# Patient Record
Sex: Male | Born: 1955 | Race: Black or African American | Hispanic: No | Marital: Married | Smoking: Former smoker
Health system: Southern US, Community
[De-identification: ages and names within clinical notes are randomized; demographics above are authoritative.]

## PROBLEM LIST (undated history)

## (undated) DIAGNOSIS — N29 Other disorders of kidney and ureter in diseases classified elsewhere: Secondary | ICD-10-CM

## (undated) DIAGNOSIS — Z79899 Other long term (current) drug therapy: Secondary | ICD-10-CM

## (undated) DIAGNOSIS — I639 Cerebral infarction, unspecified: Secondary | ICD-10-CM

## (undated) DIAGNOSIS — I509 Heart failure, unspecified: Secondary | ICD-10-CM

## (undated) DIAGNOSIS — F17200 Nicotine dependence, unspecified, uncomplicated: Secondary | ICD-10-CM

## (undated) DIAGNOSIS — N200 Calculus of kidney: Secondary | ICD-10-CM

## (undated) DIAGNOSIS — M8580 Other specified disorders of bone density and structure, unspecified site: Secondary | ICD-10-CM

## (undated) DIAGNOSIS — G4700 Insomnia, unspecified: Secondary | ICD-10-CM

## (undated) DIAGNOSIS — N183 Chronic kidney disease, stage 3 unspecified: Secondary | ICD-10-CM

## (undated) DIAGNOSIS — E785 Hyperlipidemia, unspecified: Secondary | ICD-10-CM

## (undated) DIAGNOSIS — R809 Proteinuria, unspecified: Secondary | ICD-10-CM

## (undated) DIAGNOSIS — M549 Dorsalgia, unspecified: Secondary | ICD-10-CM

## (undated) DIAGNOSIS — N529 Male erectile dysfunction, unspecified: Secondary | ICD-10-CM

## (undated) DIAGNOSIS — I2699 Other pulmonary embolism without acute cor pulmonale: Secondary | ICD-10-CM

## (undated) DIAGNOSIS — G4730 Sleep apnea, unspecified: Secondary | ICD-10-CM

## (undated) DIAGNOSIS — E669 Obesity, unspecified: Secondary | ICD-10-CM

## (undated) DIAGNOSIS — I10 Essential (primary) hypertension: Secondary | ICD-10-CM

## (undated) DIAGNOSIS — D869 Sarcoidosis, unspecified: Secondary | ICD-10-CM

## (undated) DIAGNOSIS — I77810 Thoracic aortic ectasia: Secondary | ICD-10-CM

## (undated) DIAGNOSIS — I5189 Other ill-defined heart diseases: Secondary | ICD-10-CM

## (undated) DIAGNOSIS — K859 Acute pancreatitis without necrosis or infection, unspecified: Secondary | ICD-10-CM

## (undated) DIAGNOSIS — K219 Gastro-esophageal reflux disease without esophagitis: Secondary | ICD-10-CM

## (undated) HISTORY — DX: Insomnia, unspecified: G47.00

## (undated) HISTORY — DX: Other ill-defined heart diseases: I51.89

## (undated) HISTORY — DX: Other pulmonary embolism without acute cor pulmonale: I26.99

## (undated) HISTORY — DX: Hypercalcemia: E83.52

## (undated) HISTORY — DX: Chronic kidney disease, stage 3 unspecified: N18.30

## (undated) HISTORY — DX: Male erectile dysfunction, unspecified: N52.9

## (undated) HISTORY — DX: Chronic kidney disease, stage 3 (moderate): N18.3

## (undated) HISTORY — PX: ABDOMINAL EXPLORATION SURGERY: SHX538

## (undated) HISTORY — DX: Cerebral infarction, unspecified: I63.9

## (undated) HISTORY — DX: Proteinuria, unspecified: R80.9

## (undated) HISTORY — DX: Gastro-esophageal reflux disease without esophagitis: K21.9

## (undated) HISTORY — PX: BACK SURGERY: SHX140

## (undated) HISTORY — PX: SHOULDER SURGERY: SHX246

## (undated) HISTORY — DX: Other disorders of kidney and ureter in diseases classified elsewhere: N29

## (undated) HISTORY — DX: Obesity, unspecified: E66.9

## (undated) HISTORY — DX: Other long term (current) drug therapy: Z79.899

## (undated) HISTORY — DX: Other specified disorders of bone density and structure, unspecified site: M85.80

## (undated) HISTORY — DX: Nicotine dependence, unspecified, uncomplicated: F17.200

## (undated) HISTORY — PX: CHOLECYSTECTOMY: SHX55

## (undated) HISTORY — PX: TRACHEOSTOMY: SUR1362

## (undated) HISTORY — DX: Other disorders of calcium metabolism: E83.59

## (undated) HISTORY — DX: Calculus of kidney: N20.0

## (undated) HISTORY — DX: Dorsalgia, unspecified: M54.9

---

## 1998-07-18 ENCOUNTER — Encounter: Admission: RE | Admit: 1998-07-18 | Discharge: 1998-10-16 | Payer: Self-pay | Admitting: Orthopedic Surgery

## 2000-03-29 ENCOUNTER — Encounter: Payer: Self-pay | Admitting: Emergency Medicine

## 2000-03-29 ENCOUNTER — Inpatient Hospital Stay (HOSPITAL_COMMUNITY): Admission: EM | Admit: 2000-03-29 | Discharge: 2000-04-01 | Payer: Self-pay | Admitting: Emergency Medicine

## 2000-03-30 ENCOUNTER — Encounter: Payer: Self-pay | Admitting: Internal Medicine

## 2000-03-31 ENCOUNTER — Encounter: Payer: Self-pay | Admitting: Cardiovascular Disease

## 2000-04-01 ENCOUNTER — Encounter: Payer: Self-pay | Admitting: Internal Medicine

## 2001-03-19 ENCOUNTER — Encounter: Payer: Self-pay | Admitting: Surgery

## 2001-03-19 ENCOUNTER — Encounter: Payer: Self-pay | Admitting: Emergency Medicine

## 2001-03-19 ENCOUNTER — Inpatient Hospital Stay (HOSPITAL_COMMUNITY): Admission: EM | Admit: 2001-03-19 | Discharge: 2001-06-05 | Payer: Self-pay | Admitting: Emergency Medicine

## 2001-03-19 ENCOUNTER — Encounter (INDEPENDENT_AMBULATORY_CARE_PROVIDER_SITE_OTHER): Payer: Self-pay | Admitting: Specialist

## 2001-03-21 ENCOUNTER — Encounter: Payer: Self-pay | Admitting: Surgery

## 2001-03-21 ENCOUNTER — Encounter: Payer: Self-pay | Admitting: General Surgery

## 2001-03-22 ENCOUNTER — Encounter: Payer: Self-pay | Admitting: Surgery

## 2001-03-25 ENCOUNTER — Encounter: Payer: Self-pay | Admitting: Surgery

## 2001-03-27 ENCOUNTER — Encounter: Payer: Self-pay | Admitting: Surgery

## 2001-03-28 ENCOUNTER — Encounter: Payer: Self-pay | Admitting: Pulmonary Disease

## 2001-03-28 ENCOUNTER — Encounter: Payer: Self-pay | Admitting: Surgery

## 2001-03-30 ENCOUNTER — Encounter: Payer: Self-pay | Admitting: Surgery

## 2001-03-31 ENCOUNTER — Encounter: Payer: Self-pay | Admitting: Nephrology

## 2001-03-31 ENCOUNTER — Encounter: Payer: Self-pay | Admitting: Surgery

## 2001-04-02 ENCOUNTER — Encounter: Payer: Self-pay | Admitting: Gastroenterology

## 2001-04-03 ENCOUNTER — Encounter: Payer: Self-pay | Admitting: Gastroenterology

## 2001-04-04 ENCOUNTER — Encounter: Payer: Self-pay | Admitting: Surgery

## 2001-04-05 ENCOUNTER — Encounter: Payer: Self-pay | Admitting: Pulmonary Disease

## 2001-04-06 ENCOUNTER — Encounter: Payer: Self-pay | Admitting: Pulmonary Disease

## 2001-04-08 ENCOUNTER — Encounter: Payer: Self-pay | Admitting: Pulmonary Disease

## 2001-04-10 ENCOUNTER — Encounter: Payer: Self-pay | Admitting: Surgery

## 2001-04-11 ENCOUNTER — Encounter: Payer: Self-pay | Admitting: Pulmonary Disease

## 2001-04-12 ENCOUNTER — Encounter: Payer: Self-pay | Admitting: Surgery

## 2001-04-13 ENCOUNTER — Encounter: Payer: Self-pay | Admitting: Pulmonary Disease

## 2001-04-15 ENCOUNTER — Encounter: Payer: Self-pay | Admitting: Pulmonary Disease

## 2001-04-16 ENCOUNTER — Encounter: Payer: Self-pay | Admitting: Pulmonary Disease

## 2001-04-16 ENCOUNTER — Encounter: Payer: Self-pay | Admitting: Surgery

## 2001-04-16 ENCOUNTER — Encounter: Payer: Self-pay | Admitting: General Surgery

## 2001-04-17 ENCOUNTER — Encounter: Payer: Self-pay | Admitting: Pulmonary Disease

## 2001-04-18 ENCOUNTER — Encounter: Payer: Self-pay | Admitting: Surgery

## 2001-04-20 ENCOUNTER — Encounter: Payer: Self-pay | Admitting: Surgery

## 2001-04-21 ENCOUNTER — Encounter: Payer: Self-pay | Admitting: Surgery

## 2001-04-27 ENCOUNTER — Encounter: Payer: Self-pay | Admitting: Critical Care Medicine

## 2001-04-27 ENCOUNTER — Encounter: Payer: Self-pay | Admitting: Surgery

## 2001-04-28 ENCOUNTER — Encounter: Payer: Self-pay | Admitting: Surgery

## 2001-04-29 ENCOUNTER — Encounter: Payer: Self-pay | Admitting: Critical Care Medicine

## 2001-04-30 ENCOUNTER — Encounter: Payer: Self-pay | Admitting: Critical Care Medicine

## 2001-05-03 ENCOUNTER — Encounter: Payer: Self-pay | Admitting: Surgery

## 2001-05-07 ENCOUNTER — Encounter: Payer: Self-pay | Admitting: Surgery

## 2001-05-09 ENCOUNTER — Encounter: Payer: Self-pay | Admitting: Surgery

## 2001-05-10 ENCOUNTER — Encounter: Payer: Self-pay | Admitting: Surgery

## 2001-05-12 ENCOUNTER — Encounter: Payer: Self-pay | Admitting: Surgery

## 2001-05-17 ENCOUNTER — Encounter: Payer: Self-pay | Admitting: Surgery

## 2001-05-19 ENCOUNTER — Encounter: Payer: Self-pay | Admitting: Surgery

## 2001-05-26 ENCOUNTER — Encounter: Payer: Self-pay | Admitting: Surgery

## 2001-05-30 ENCOUNTER — Encounter: Payer: Self-pay | Admitting: Surgery

## 2001-06-14 ENCOUNTER — Encounter: Admission: RE | Admit: 2001-06-14 | Discharge: 2001-07-27 | Payer: Self-pay | Admitting: Surgery

## 2001-07-07 ENCOUNTER — Ambulatory Visit (HOSPITAL_COMMUNITY): Admission: RE | Admit: 2001-07-07 | Discharge: 2001-07-07 | Payer: Self-pay | Admitting: Gastroenterology

## 2001-07-07 ENCOUNTER — Encounter: Payer: Self-pay | Admitting: Gastroenterology

## 2001-08-18 ENCOUNTER — Encounter (INDEPENDENT_AMBULATORY_CARE_PROVIDER_SITE_OTHER): Payer: Self-pay

## 2001-08-18 ENCOUNTER — Ambulatory Visit (HOSPITAL_COMMUNITY): Admission: RE | Admit: 2001-08-18 | Discharge: 2001-08-18 | Payer: Self-pay | Admitting: Gastroenterology

## 2001-08-18 ENCOUNTER — Encounter: Payer: Self-pay | Admitting: Surgery

## 2003-03-14 ENCOUNTER — Ambulatory Visit (HOSPITAL_COMMUNITY): Admission: RE | Admit: 2003-03-14 | Discharge: 2003-03-14 | Payer: Self-pay | Admitting: Internal Medicine

## 2003-03-14 ENCOUNTER — Encounter: Payer: Self-pay | Admitting: Internal Medicine

## 2003-03-14 ENCOUNTER — Encounter (INDEPENDENT_AMBULATORY_CARE_PROVIDER_SITE_OTHER): Payer: Self-pay | Admitting: Specialist

## 2003-05-29 ENCOUNTER — Emergency Department (HOSPITAL_COMMUNITY): Admission: EM | Admit: 2003-05-29 | Discharge: 2003-05-30 | Payer: Self-pay | Admitting: Emergency Medicine

## 2003-10-02 ENCOUNTER — Inpatient Hospital Stay (HOSPITAL_COMMUNITY): Admission: RE | Admit: 2003-10-02 | Discharge: 2003-10-11 | Payer: Self-pay | Admitting: Internal Medicine

## 2003-10-14 ENCOUNTER — Ambulatory Visit (HOSPITAL_COMMUNITY): Admission: RE | Admit: 2003-10-14 | Discharge: 2003-10-14 | Payer: Self-pay | Admitting: Internal Medicine

## 2003-11-20 ENCOUNTER — Inpatient Hospital Stay (HOSPITAL_COMMUNITY): Admission: AD | Admit: 2003-11-20 | Discharge: 2003-11-29 | Payer: Self-pay | Admitting: Internal Medicine

## 2003-12-07 ENCOUNTER — Emergency Department (HOSPITAL_COMMUNITY): Admission: EM | Admit: 2003-12-07 | Discharge: 2003-12-07 | Payer: Self-pay | Admitting: Emergency Medicine

## 2003-12-20 ENCOUNTER — Inpatient Hospital Stay (HOSPITAL_COMMUNITY): Admission: RE | Admit: 2003-12-20 | Discharge: 2003-12-25 | Payer: Self-pay | Admitting: Surgery

## 2004-01-25 ENCOUNTER — Inpatient Hospital Stay (HOSPITAL_COMMUNITY): Admission: EM | Admit: 2004-01-25 | Discharge: 2004-01-30 | Payer: Self-pay | Admitting: Emergency Medicine

## 2004-02-12 ENCOUNTER — Encounter: Admission: RE | Admit: 2004-02-12 | Discharge: 2004-02-12 | Payer: Self-pay | Admitting: Surgery

## 2004-03-04 ENCOUNTER — Encounter: Admission: RE | Admit: 2004-03-04 | Discharge: 2004-03-04 | Payer: Self-pay | Admitting: Surgery

## 2004-09-29 ENCOUNTER — Encounter: Admission: RE | Admit: 2004-09-29 | Discharge: 2004-09-29 | Payer: Self-pay | Admitting: Gastroenterology

## 2005-04-04 ENCOUNTER — Ambulatory Visit (HOSPITAL_COMMUNITY): Admission: RE | Admit: 2005-04-04 | Discharge: 2005-04-04 | Payer: Self-pay | Admitting: Family Medicine

## 2005-04-04 ENCOUNTER — Emergency Department (HOSPITAL_COMMUNITY): Admission: EM | Admit: 2005-04-04 | Discharge: 2005-04-04 | Payer: Self-pay | Admitting: Family Medicine

## 2005-04-05 ENCOUNTER — Emergency Department (HOSPITAL_COMMUNITY): Admission: EM | Admit: 2005-04-05 | Discharge: 2005-04-05 | Payer: Self-pay | Admitting: *Deleted

## 2005-07-19 ENCOUNTER — Ambulatory Visit (HOSPITAL_COMMUNITY): Admission: RE | Admit: 2005-07-19 | Discharge: 2005-07-19 | Payer: Self-pay | Admitting: Internal Medicine

## 2006-09-15 ENCOUNTER — Encounter: Admission: RE | Admit: 2006-09-15 | Discharge: 2006-09-15 | Payer: Self-pay | Admitting: General Surgery

## 2007-02-07 ENCOUNTER — Inpatient Hospital Stay (HOSPITAL_COMMUNITY): Admission: AD | Admit: 2007-02-07 | Discharge: 2007-02-09 | Payer: Self-pay | Admitting: Internal Medicine

## 2007-02-07 ENCOUNTER — Ambulatory Visit: Payer: Self-pay | Admitting: Internal Medicine

## 2007-05-11 ENCOUNTER — Ambulatory Visit: Payer: Self-pay | Admitting: Internal Medicine

## 2007-05-11 LAB — CONVERTED CEMR LAB: Angiotensin 1 Converting Enzyme: 29 units/L (ref 9–67)

## 2007-10-03 ENCOUNTER — Encounter: Payer: Self-pay | Admitting: Internal Medicine

## 2007-10-05 ENCOUNTER — Ambulatory Visit (HOSPITAL_COMMUNITY): Admission: RE | Admit: 2007-10-05 | Discharge: 2007-10-05 | Payer: Self-pay | Admitting: Internal Medicine

## 2007-10-08 ENCOUNTER — Emergency Department (HOSPITAL_COMMUNITY): Admission: EM | Admit: 2007-10-08 | Discharge: 2007-10-09 | Payer: Self-pay | Admitting: Emergency Medicine

## 2007-10-16 ENCOUNTER — Emergency Department (HOSPITAL_COMMUNITY): Admission: EM | Admit: 2007-10-16 | Discharge: 2007-10-16 | Payer: Self-pay | Admitting: Emergency Medicine

## 2008-02-18 ENCOUNTER — Emergency Department (HOSPITAL_COMMUNITY): Admission: EM | Admit: 2008-02-18 | Discharge: 2008-02-18 | Payer: Self-pay | Admitting: Family Medicine

## 2009-04-27 ENCOUNTER — Ambulatory Visit: Payer: Self-pay | Admitting: Cardiovascular Disease

## 2009-04-27 ENCOUNTER — Inpatient Hospital Stay (HOSPITAL_COMMUNITY): Admission: EM | Admit: 2009-04-27 | Discharge: 2009-04-28 | Payer: Self-pay | Admitting: Emergency Medicine

## 2009-04-28 ENCOUNTER — Ambulatory Visit: Payer: Self-pay

## 2009-04-28 ENCOUNTER — Encounter: Payer: Self-pay | Admitting: Cardiology

## 2009-06-18 ENCOUNTER — Emergency Department (HOSPITAL_COMMUNITY): Admission: EM | Admit: 2009-06-18 | Discharge: 2009-06-19 | Payer: Self-pay | Admitting: Emergency Medicine

## 2009-07-21 ENCOUNTER — Encounter: Admission: RE | Admit: 2009-07-21 | Discharge: 2009-07-21 | Payer: Self-pay | Admitting: Orthopaedic Surgery

## 2009-08-05 ENCOUNTER — Encounter: Admission: RE | Admit: 2009-08-05 | Discharge: 2009-08-05 | Payer: Self-pay | Admitting: Orthopaedic Surgery

## 2009-11-24 ENCOUNTER — Encounter: Payer: Self-pay | Admitting: Sports Medicine

## 2009-11-28 ENCOUNTER — Ambulatory Visit: Payer: Self-pay | Admitting: Family Medicine

## 2009-11-28 DIAGNOSIS — M25519 Pain in unspecified shoulder: Secondary | ICD-10-CM | POA: Insufficient documentation

## 2010-03-02 ENCOUNTER — Encounter: Payer: Self-pay | Admitting: Nephrology

## 2010-03-02 ENCOUNTER — Observation Stay (HOSPITAL_COMMUNITY): Admission: AD | Admit: 2010-03-02 | Discharge: 2010-03-03 | Payer: Self-pay | Admitting: Urology

## 2010-06-26 ENCOUNTER — Encounter: Admission: RE | Admit: 2010-06-26 | Discharge: 2010-06-26 | Payer: Self-pay | Admitting: Family Medicine

## 2010-09-20 ENCOUNTER — Ambulatory Visit (HOSPITAL_COMMUNITY)
Admission: RE | Admit: 2010-09-20 | Discharge: 2010-09-20 | Payer: Self-pay | Source: Home / Self Care | Attending: Family Medicine | Admitting: Family Medicine

## 2010-09-20 ENCOUNTER — Encounter: Payer: Self-pay | Admitting: Family Medicine

## 2010-09-27 ENCOUNTER — Encounter: Payer: Self-pay | Admitting: Orthopaedic Surgery

## 2010-09-27 ENCOUNTER — Encounter: Payer: Self-pay | Admitting: Gastroenterology

## 2010-09-27 ENCOUNTER — Encounter: Payer: Self-pay | Admitting: Urology

## 2010-10-06 NOTE — Consult Note (Signed)
Summary: [facility]   Imported By: Knox Royalty 12/24/2009 11:12:37  _____________________________________________________________________  External Attachment:    Type:   Image     Comment:   External Document

## 2010-10-06 NOTE — Assessment & Plan Note (Signed)
Summary: [name]   Vital Signs:  Patient profile:   55 year old male Height:      72 inches Weight:      265 pounds BP sitting:   156 / 107  Vitals Entered By: Lillia Pauls CMA (November 28, 2009 10:31 AM)  History of Present Illness: Right-hand dominant. Accidentally fell down dimly lit staircase  ~3 weeks ago. No head trauma or LOC. Fell onto lateral left shoulder. No popping/tearing/subluxation/dislocation sensation. No paresthesias. No neck pain. No prior shoulder pain or injuries. Unsure as to whether he had limited left shoulder ROM prior to this injury.   Allergies: 1)  ! Codeine  Past History:  Past Medical History: Sarcoidosis HTN HLD Hx of pancreatitis with ARDS  Past Surgical History: Lumbar surgery for disc herniation and lumbar radiculitis  Social History: Not working  Physical Exam  General:  Well-developed,well-nourished,in no acute distress; alert,appropriate and cooperative throughout examination Msk:  NECK: Full ROM without pain or symptoms.  SHOULDERS: Full ROM/strength right.  Antero-lateral ttp left. No swelling/discoloration. 90 active flex/abd left. 100 passive flex/abd left. 30 deg ER deficit left. Full passive ER left. Nl IR left with pain. Decreased ER strength left otherwise nl strength. Pain throughout all motions except IR. (+) Hawkins, Empty Can, AC jt ttp on left. No AC jt step-off. No labral instability.  Extremities:  Normal nv exam of upper extremities. Additional Exam:  Musculoskeletal Ultrasound: Longitudinal and transverse views of the left shoulder revealed the following:  Biceps Tendon: Intact. AC Joint: Normal. Subscapularis: Increased bursal hypoechogenicity. Intact w/linear calcifications c/w old injuries. No dynamic subcoracoid impingement. Supraspinatus: Increased bursal hypoechogenicity. (+) dynamic subacromial impingement. Partial thickness tear over articular surface. Infraspinatus: Limited  views did not reveal a tear. Teres Minor: Limited views did not reveal a tear. Deltoid: Normal. Humeral Head: Normal contour.    Impression & Recommendations:  Problem # 1:  SHOULDER PAIN, LEFT (ICD-719.41)  Exam findings c/w frozen shoulder and SA bursitis. Ultrasound showing partial thickness supraspinatus tear.  After obtaining informed verbal consent from the patient, the anterior and posterior aspects of his left shoulder we prepped with alcohol and betadine. Ethyl chloride was used to anesthetize the skin. A 5 ml:3 ml mixture of 1% lidocaine and kenalog 89ml/ml was prepared for injection. One-half of this mixture was injected into the left GH joint without complications or difficulty. The remaining portion of the mixture was injected into the left sub-acromial space via a posterior approach.  - Immediately seek medical attention for fever, increased pain, shoulder swelling/discoloration, or any other concerns. - Continue current medication regimen for sarcoidosis. - Formal physical therapy. - RTC in 3-4 weeks for interim re-assessment.  Orders: Joint Aspirate / Injection, Large (20610) Kenalog 10 mg inj (J3301) [place] LIMITED ([id number])

## 2010-10-06 NOTE — Letter (Signed)
Summary: [facility] Referral form  [facility] Ref[facility]   Imported By: Marily Memos 12/01/2009 09:30:41  _____________________________________________________________________  External Attachment:    Type:   Image     Comment:   External Document

## 2010-11-22 LAB — BASIC METABOLIC PANEL
BUN: 13 mg/dL (ref 6–23)
CO2: 27 mEq/L (ref 19–32)
Calcium: 9 mg/dL (ref 8.4–10.5)
Chloride: 106 mEq/L (ref 96–112)
Creatinine, Ser: 1.77 mg/dL — ABNORMAL HIGH (ref 0.4–1.5)
GFR calc Af Amer: 49 mL/min — ABNORMAL LOW (ref >60)
GFR calc non Af Amer: 40 mL/min — ABNORMAL LOW (ref >60)
Glucose, Bld: 125 mg/dL — ABNORMAL HIGH (ref 70–99)
Potassium: 3.9 mEq/L (ref 3.5–5.1)
Sodium: 139 mEq/L (ref 135–145)

## 2010-11-22 LAB — CBC
HCT: 40.4 % (ref 39.0–52.0)
HCT: 42.8 % (ref 39.0–52.0)
HCT: 39.6 % (ref 39.0–52.0)
HCT: 39.9 % (ref 39.0–52.0)
Hemoglobin: 13.8 g/dL (ref 13.0–17.0)
Hemoglobin: 14.4 g/dL (ref 13.0–17.0)
Hemoglobin: 13.4 g/dL (ref 13.0–17.0)
Hemoglobin: 13.5 g/dL (ref 13.0–17.0)
MCH: 30 pg (ref 26.0–34.0)
MCH: 30.3 pg (ref 26.0–34.0)
MCH: 29.9 pg (ref 26.0–34.0)
MCH: 29.9 pg (ref 26.0–34.0)
MCHC: 33.7 g/dL (ref 30.0–36.0)
MCHC: 34.1 g/dL (ref 30.0–36.0)
MCHC: 33.8 g/dL (ref 30.0–36.0)
MCHC: 33.9 g/dL (ref 30.0–36.0)
MCV: 88.9 fL (ref 78.0–100.0)
MCV: 89 fL (ref 78.0–100.0)
MCV: 88.2 fL (ref 78.0–100.0)
MCV: 88.5 fL (ref 78.0–100.0)
Platelets: 222 10*3/uL (ref 150–400)
Platelets: 249 10*3/uL (ref 150–400)
Platelets: 233 10*3/uL (ref 150–400)
Platelets: 241 10*3/uL (ref 150–400)
RBC: 4.54 MIL/uL (ref 4.22–5.81)
RBC: 4.81 MIL/uL (ref 4.22–5.81)
RBC: 4.48 MIL/uL (ref 4.22–5.81)
RBC: 4.52 MIL/uL (ref 4.22–5.81)
RDW: 16.2 % — ABNORMAL HIGH (ref 11.5–15.5)
RDW: 16.3 % — ABNORMAL HIGH (ref 11.5–15.5)
RDW: 16.2 % — ABNORMAL HIGH (ref 11.5–15.5)
RDW: 16.3 % — ABNORMAL HIGH (ref 11.5–15.5)
WBC: 9 10*3/uL (ref 4.0–10.5)
WBC: 9.9 10*3/uL (ref 4.0–10.5)
WBC: 10 10*3/uL (ref 4.0–10.5)
WBC: 11.4 10*3/uL — ABNORMAL HIGH (ref 4.0–10.5)

## 2010-11-22 LAB — PROTIME-INR
INR: 1.1 (ref 0.00–1.49)
Prothrombin Time: 14.1 seconds (ref 11.6–15.2)

## 2010-11-22 LAB — APTT: aPTT: 33 seconds (ref 24–37)

## 2010-12-12 LAB — COMPREHENSIVE METABOLIC PANEL
ALT: 39 U/L (ref 0–53)
ALT: 46 U/L (ref 0–53)
AST: 22 U/L (ref 0–37)
AST: 28 U/L (ref 0–37)
Albumin: 3.2 g/dL — ABNORMAL LOW (ref 3.5–5.2)
Albumin: 3.4 g/dL — ABNORMAL LOW (ref 3.5–5.2)
Alkaline Phosphatase: 62 U/L (ref 39–117)
Alkaline Phosphatase: 73 U/L (ref 39–117)
BUN: 12 mg/dL (ref 6–23)
BUN: 9 mg/dL (ref 6–23)
CO2: 27 mEq/L (ref 19–32)
CO2: 27 mEq/L (ref 19–32)
Calcium: 9.4 mg/dL (ref 8.4–10.5)
Calcium: 9.5 mg/dL (ref 8.4–10.5)
Chloride: 106 mEq/L (ref 96–112)
Chloride: 108 mEq/L (ref 96–112)
Creatinine, Ser: 1.3 mg/dL (ref 0.4–1.5)
Creatinine, Ser: 1.35 mg/dL (ref 0.4–1.5)
GFR calc Af Amer: >60 mL/min (ref >60)
GFR calc Af Amer: >60 mL/min (ref >60)
GFR calc non Af Amer: 56 mL/min — ABNORMAL LOW (ref >60)
GFR calc non Af Amer: 58 mL/min — ABNORMAL LOW (ref >60)
Glucose, Bld: 103 mg/dL — ABNORMAL HIGH (ref 70–99)
Glucose, Bld: 112 mg/dL — ABNORMAL HIGH (ref 70–99)
Potassium: 3.4 mEq/L — ABNORMAL LOW (ref 3.5–5.1)
Potassium: 3.6 mEq/L (ref 3.5–5.1)
Sodium: 141 mEq/L (ref 135–145)
Sodium: 142 mEq/L (ref 135–145)
Total Bilirubin: 0.6 mg/dL (ref 0.3–1.2)
Total Bilirubin: 0.7 mg/dL (ref 0.3–1.2)
Total Protein: 6.7 g/dL (ref 6.0–8.3)
Total Protein: 7 g/dL (ref 6.0–8.3)

## 2010-12-12 LAB — CBC
HCT: 40.3 % (ref 39.0–52.0)
HCT: 41.6 % (ref 39.0–52.0)
Hemoglobin: 13.6 g/dL (ref 13.0–17.0)
Hemoglobin: 14.2 g/dL (ref 13.0–17.0)
MCHC: 33.8 g/dL (ref 30.0–36.0)
MCHC: 34.2 g/dL (ref 30.0–36.0)
MCV: 87.7 fL (ref 78.0–100.0)
MCV: 88.2 fL (ref 78.0–100.0)
Platelets: 193 10*3/uL (ref 150–400)
Platelets: 214 10*3/uL (ref 150–400)
RBC: 4.6 MIL/uL (ref 4.22–5.81)
RBC: 4.72 MIL/uL (ref 4.22–5.81)
RDW: 16.1 % — ABNORMAL HIGH (ref 11.5–15.5)
RDW: 16.5 % — ABNORMAL HIGH (ref 11.5–15.5)
WBC: 7.1 10*3/uL (ref 4.0–10.5)
WBC: 8.2 10*3/uL (ref 4.0–10.5)

## 2010-12-12 LAB — APTT: aPTT: 36 seconds (ref 24–37)

## 2010-12-12 LAB — POCT CARDIAC MARKERS
CKMB, poc: <1 ng/mL — ABNORMAL LOW (ref 1.0–8.0)
Myoglobin, poc: 68.6 ng/mL (ref 12–200)
Troponin i, poc: <0.05 ng/mL (ref 0.00–0.09)

## 2010-12-12 LAB — DIFFERENTIAL
Basophils Absolute: 0 10*3/uL (ref 0.0–0.1)
Basophils Relative: 1 % (ref 0–1)
Eosinophils Absolute: 0.7 10*3/uL (ref 0.0–0.7)
Eosinophils Relative: 10 % — ABNORMAL HIGH (ref 0–5)
Lymphocytes Relative: 12 % (ref 12–46)
Lymphs Abs: 0.9 10*3/uL (ref 0.7–4.0)
Monocytes Absolute: 0.4 10*3/uL (ref 0.1–1.0)
Monocytes Relative: 6 % (ref 3–12)
Neutro Abs: 5 10*3/uL (ref 1.7–7.7)
Neutrophils Relative %: 71 % (ref 43–77)

## 2010-12-12 LAB — LIPID PANEL
Cholesterol: 159 mg/dL (ref 0–200)
HDL: 27 mg/dL — ABNORMAL LOW (ref >39)
LDL Cholesterol: 64 mg/dL (ref 0–99)
Total CHOL/HDL Ratio: 5.9 RATIO
Triglycerides: 341 mg/dL — ABNORMAL HIGH (ref <150)
VLDL: 68 mg/dL — ABNORMAL HIGH (ref 0–40)

## 2010-12-12 LAB — PROTIME-INR
INR: 1 (ref 0.00–1.49)
Prothrombin Time: 13.2 seconds (ref 11.6–15.2)

## 2010-12-12 LAB — CARDIAC PANEL(CRET KIN+CKTOT+MB+TROPI)
CK, MB: 0.8 ng/mL (ref 0.3–4.0)
CK, MB: 0.9 ng/mL (ref 0.3–4.0)
Relative Index: INVALID (ref 0.0–2.5)
Relative Index: INVALID (ref 0.0–2.5)
Total CK: 51 U/L (ref 7–232)
Total CK: 73 U/L (ref 7–232)
Troponin I: 0.01 ng/mL (ref 0.00–0.06)
Troponin I: 0.01 ng/mL (ref 0.00–0.06)

## 2010-12-12 LAB — LIPASE, BLOOD: Lipase: 48 U/L (ref 11–59)

## 2010-12-12 LAB — CK TOTAL AND CKMB (NOT AT ARMC)
CK, MB: 1.2 ng/mL (ref 0.3–4.0)
Relative Index: INVALID (ref 0.0–2.5)
Total CK: 67 U/L (ref 7–232)

## 2010-12-12 LAB — HEPARIN LEVEL (UNFRACTIONATED)
Heparin Unfractionated: 0.48 IU/mL (ref 0.30–0.70)
Heparin Unfractionated: 0.57 IU/mL (ref 0.30–0.70)

## 2010-12-12 LAB — TROPONIN I: Troponin I: 0.01 ng/mL (ref 0.00–0.06)

## 2010-12-12 LAB — TSH: TSH: 0.689 u[IU]/mL (ref 0.350–4.500)

## 2011-01-19 NOTE — H&P (Signed)
[name], [name] NO.:  [id number]   MEDICAL RECORD NO.:  [id number]          PATIENT TYPE:  INP   LOCATION:  4732                         FACILITY:  [facility]   PHYSICIAN:  Manuel Pounds, MD       DATE OF BIRTH:  Jun 15, 1956   DATE OF ADMISSION:  02/07/2007  DATE OF DISCHARGE:                              HISTORY & PHYSICAL   PRIMARY CARE PHYSICIAN:  Manuel Pounds, MD.   PULMONOLOGIST:  Manuel Chris. Maple Hudson, MD, FCCP, FACP.   GASTROENTEROLOGIST:  [name], M.D.   UROLOGISTLucrezia Starch. Earlene Hall, M.D.   SURGEON:  Manuel Dare. Janee Hall, M.D.   CHIEF COMPLAINT:  Hypercalcemia and acute renal failure.   HISTORY OF PRESENT ILLNESS:  [name] is a patient well known  to me.  He is 55 years old and has biopsy-proven sarcoid and has seen  Dr. Fannie Knee in the past.  He had multiple pancreatic pseudocysts for  which multiple surgeries were undertaken by Dr. Magnus Ivan when he was  with [facility].  He also had a significant gallstone  pancreatitis which resulted in sepsis, ARDS, and acute renal failure for  which he was on dialysis for a short period of time back in July 2002.  He also had significant surgeries at that time.  The patient saw me on  April 29, followed up on May 16, and followed up again today, June 3.  The recent visits have been to include regular 67-month office visit for  hypertension, hyperlipidemia, and other medical issues. We have been  following his mild hypercalcemia which has mainly been in the range of  10.5 to 10.9.  We have been mainly attributing this to his underlying  sarcoidosis.   Labs have subsequently come back where his calcium in October 2006 was  10.2.  In November 2006, it was 11.0.  In March 2007, it was 10.4.  October 2007 it was 10.9, and on the April 29 visit, it went up to 12.1.  I had him come back the following week on May 5, and he had a calcium of  11.0 with ionized calcium of 6.3.  I repeated his labs  again on May 16  where he had a 25 hydroxyvitamin D which was low at 23.2.  He had intact  PTH which was appropriately suppressed at 12.7.  He had SPEP and UPEP  which were both negative.  His total protein was 8.3.  Creatinine over  this time was 1.6 and usually is around 1.3 to 1.5.  He was started on  hydrochlorothiazide earlier in the year, and this had subsequently been  discontinued for well over a month.  He drank plenty of water of late.  He took Lasix for 7 days.  He is not having any current symptoms  whatsoever except for mild chills, some stuffiness with nasal discharge,  mild cough but no signs or symptoms of the elevated calcium.  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  He is  not weak.  He is not having neurologic symptoms.  He is  not having any confusion.   I brought him in today to repeat his labs STAT and go over them at the  visit as I was expecting to have him back down to his baseline numbers.  Unfortunately, I was wrong.  His BUN was 21; his creatinine was 2.6, and  his calcium was 13.2.  After a long discussion with the patient, we  decided to admit him for further evaluation and treatment, place him on  IV fluids, get a broader range of labs, and decide what if other workup  needs to be done.  My underlying suspicion is that this is all related  to his sarcoidosis, although other options need to be entertained.   PAST MEDICAL HISTORY:  1. Gallstone pancreatitis in July 2002 resulting in sepsis, ARDS, and      acute renal failure for which he required hemodialysis.  He      subsequently underwent a cholecystectomy and a ventral hernia      repair.  2. Hypertension.  3. Hyperlipidemia.  4. Recurrent retroperitoneal abscesses requiring I&D, drains, and      antibiotics via PICC line.  5. ADD.  6. History of hemorrhoidectomy.  7. Known sarcoidosis.   SOCIAL HISTORY:  He is married.  He has two daughters.  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  He does not drink.  He does not  smoke.   FAMILY HISTORY:  Hypertension, stroke.   ALLERGIES:  CODEINE, VICODIN and intolerance to HYDROCHLOROTHIAZIDE  which makes his hypercalcemia worse.   MEDICATIONS:  1. Altace 10 b.i.d.  2. Cardizem CD 300 daily.  3. Benicar 20 daily.  4. TriCor 145 mg daily.  5. Ambien 10 mg nightly.  6. Wellbutrin XL 150 daily.   REVIEW OF SYSTEMS:  He denies any fevers, night sweats, weight changes,  or adenopathy.  He denies any headaches, sore throat, voice changes.  He  denies any skin issues.  He denies any chest pain, shortness of breath,  dyspnea on exertion, cardiothoracic type symptoms.  He does have a mild  cough. His wife says that he wheezes once in a while.  I do not hear any  at this current time.  He has been sniffing and stuffy from nasal  congestion.  He denies any urinary symptoms.  He denies any weakness or  numbness.  He denies any mood disturbance.  He denies any joint issues.  Denies any nausea, vomiting, diarrhea, or bowel issues.  He denies any  polyuria, polydipsia, or endocrine-type issues.   PHYSICAL EXAMINATION:  VITAL SIGNS:  Afebrile.  Pulse is 96.  Weight is  230 Hall, which is unchanged.  His blood pressure is 140/80.  GENERAL:  He is alert and oriented x3.  EYES, EARS, NOSE, AND THROAT:  PERRLA.  EOMI.  Sclerae clear.  Oropharynx moist.  NECK:  No JVD.  Supple without lymphadenopathy.  He has an old  tracheostomy scar.  CARDIOVASCULAR:  Regular without murmurs.  PULMONARY:  Clear to auscultation bilaterally.  ABDOMEN:  Obese and soft.  Multiple abdominal scars noted.  EXTREMITIES:  No clubbing, no cyanosis, no edema.  NEUROLOGIC:  Grossly intact.  Alert and oriented x3.  There are 2+  reflexes throughout.   ANCILLARY DATA:  Blood glucose 121, BUN 21, creatinine 2.6, sodium 137,  potassium 4.5, chloride 102, bicarb 28, calcium 13.2.  He had SPEP,  UPEP dated Jan 20, 2007, which showed no monoclonal gammopathy, and no  myeloma was seen.  Total protein at that time was 8.3 with a vitamin D  level of 23.2 and intact PTH of 12.7.  His last few creatinines have  been 1.6 and 1.8 as described above.  His ionized calcium was 6.3  recently.  His last hemoglobin was 13.1.  His last urinalysis was clear  with 2+ protein.  Last amylase and lipase were normal.   Biopsy report from March 14, 2003, of a retroperitoneal lymph node shows  granulomatous inflammation, no evidence of metastatic carcinoma or  lymphoproliferative process.  This was compatible with sarcoidosis.   ASSESSMENT:  This is a 55 year old gentleman being admitted with  significant hypercalcemia with acute renal failure.   PLAN:  1. Admit.  2. Hypercalcemia of questionable source, probably related to his      underlying sarcoidosis.  Will hold his Altace and Benicar at this      current time given the elevated creatinine. Will go ahead and give      him significant IV fluids overnight and check a CT scan of his      chest, abdomen, and pelvis to see if there is any other etiology or      any specific cancers that may be doing this.  Again, the patient      has known sarcoidosis and known lymph nodes in his abdomen.  He has      already been evaluated by Dr. Fannie Knee in the past.  It is very      possible this is a time when he might need to be started on some      steroids as well as the IV fluids that were given.  At this current      time, we will hold on the pamidronate and IV bisphosphate until      tomorrow to see where he ends up.  He may also need a renal consult      based on the elevated creatinine.  He has no current need for      hemodialysis.  Per Dr. Harriette Ohara note on April 19, 2003, I      think it is unlike that there is active disease demanding therapy      now.  I think sarcoid is much more likely than secondary illness      such as lymphoma.  He felt that we could  follow him      symptomatically.  At this point, he may be becoming symptomatic,      and clearly if this were lymphoma 4 years ago, he would no longer      be alive not getting any underlying treatment whatsoever.  3. As for the acute renal failure, there is a chance that he might      have some bladder obstructive symptoms. He has had these in the      past for which Dr. [name] has seen him.  The CT scan of the      chest, abdomen, and pelvis to see if he has any hydronephrosis.      His last PSA was 0.43 in the middle of last year.  4. I went ahead and ordered some tumor markers just to be safe and      thorough.  5. I did run a CT scan of his abdomen and pelvis with and without IV  contrast back on November 17, 2005, with the results being      questionable parenchymal scar versus developing neoplasm in the      right lower lobe.  Mesenteric, pelvic, and inguinal adenopathy were      noted.  Area of abnormality involving the region of the distal     descending colon suggested either developing diverticulitis or      scarring from prior inflammatory process.  These findings were      communicated to me and suggested return for repeat CT scan in the      future.  I discussed these results with Dr. Quentin Mulling, over at      [facility], and told him that most of these are      consistent with his prior CT scans and biopsy specifically showing      sarcoidosis in the past.  He agreed and left it in my court to      follow this up as deemed appropriate.  6. As stated above, he be getting IV fluids tonight.  7. May need pamidronate.  8. May need renal and pulmonary consults as stated above.  9. Will watch the blood pressure now that I have taken him off two of      his blood pressure medications that he may need.  10.He has a history of retroperitoneal abscess which has not been a      problem.  11.Insomnia.  He will be continued on Ambien.  12.Hyperlipidemia. This is  not currently an issue, but he remains on      the TriCor.  13.Hopefully by tomorrow we will have a better idea of what the      underlying plan will be based on what his numbers are tonight and      into tomorrow.      Manuel Pounds, MD  Electronically Signed     JMR/MEDQ  D:  02/07/2007  T:  02/07/2007  Job:  [id number]

## 2011-01-19 NOTE — Consult Note (Signed)
[name], [name]               ACCOUNT NO.:  [id number]   MEDICAL RECORD NO.:  [id number]          PATIENT TYPE:  INP   LOCATION:  4732                         FACILITY:  [facility]   PHYSICIAN:  Clinton D. Maple Hudson, MD, FCCP, FACPDATE OF BIRTH:  30-Aug-1956   DATE OF CONSULTATION:  02/08/2007  DATE OF DISCHARGE:                                 CONSULTATION   PROBLEM FOR CONSULTATION:  A 55 year old former smoker admitted by Dr.  Timothy Lasso and needing evaluation for sarcoidosis.   HISTORY:  He had originally been spotted with bilateral hilar adenopathy  on chest x-ray in 1994.  He was treated briefly then with prednisone,  but that was stopped when it caused heartburn and he was lost to  followup.  He subsequently presented again in May 2004 with hilar  adenopathy and an angiotensin converting enzyme level of 34 (18-55).  Chest x-ray showed some nodularity in the right lung.  PPD skin test was  negative in August 2004.  He had undergone needle biopsy of a  retroperitoneal lymph node in July 2004 showing noncaseating  granulomatous negative for AFB.  He subsequently had gallstone  pancreatitis and developed ARDS requiring intubation and tracheostomy.  Prednisone for the sarcoid was withheld out of concern that they would  reactivate his pancreatitis.  He has been relatively asymptomatic for  routine followup and was admitted out of concern about increasing  calcium and deteriorating renal function labs.   REVIEW OF SYSTEMS:  Pushing a little on his symptoms, he admits now he  has had a little low grade intermittent cough.  Occasionally, he notes  deep substernal ache, mild low grade fevers and night sweats.  There has  been nothing sudden.  He has recognized distinct bile tasting reflux  sometimes at night, with no dysphagia.  His wife tells him that he  wheezes at night after he has cut grass.  There is persistent nasal  congestion.  He has not been aware of obvious rash, hot joints or  adenopathy, blood or purulent discharge.   PAST HISTORY:  Gallstone pancreatitis 2002 with sepsis, ARDS and acute  renal failure requiring hemodialysis, ventilator support, tracheostomy  as noted, cholecystectomy, ventral hernia repair, hypertension,  hyperlipidemia, recurrent retroperitoneal abscesses requiring drains and  prolonged antibiotics, attention deficit disorder, hemorrhoidectomy,  sarcoidosis.   SOCIAL HISTORY:  Truck driver for [employer].   SUMMARY:  Regular sleep wake schedule working night shift.  No alcohol.  He had smoked one pack per day, quitting around 2002.   FAMILY HISTORY:  Positive for cerebral vascular accidents, hypertension.   ALLERGIES:  Drug intolerance:  CODEINE, VICODIN, HYDROCHLOROTHIAZIDE  because of his calcium.   PHYSICAL EXAMINATION:  VITAL SIGNS:  BP 126/84, pulse 93 room air  saturation 95%, temperature 98.6, respirations 18.  GENERAL:  He is a somewhat overweight, pleasant, comfortable-appearing  African American male who was asleep and woke easily when I walked into  the room.  SKIN:  No obvious rash.  ADENOPATHY:  None at that neck, shoulders or axillae.  HEENT:  Voice quality normal, oral mucosa unremarkable.  No stridor.  No  neck vein distension.  CHEST:  Faint crackles are appreciated in the right mid axillary line,  especially with the benefit of having seen his chest CT scan first.  Otherwise, lung fields were quiet, breathing unlabored.  CARDIOVASCULAR:  Heart sounds regular without murmur or gallop.  ABDOMEN:  Obese and soft, nontender with faint bowel sounds present.  EXTREMITIES:  Without cyanosis, clubbing or edema.   LABORATORY DATA:  Laboratory significant for pending angiotensin  converting enzyme level.  I reviewed his chest CT, which shows bilateral  hilar adenopathy and scarring in the right mid lung zone.  There is a  small pleural based nodule in the mid lateral left lung.   IMPRESSION:  1. Probably low grade chronic  active sarcoidosis, now with increased      calcium and renal function involvement.  I am not convinced that      his pulmonary sarcoid status has changed appreciably since he had a      CT scan at [facility] in 2004.  I will try to get our      radiologist to obtain that data for direct comparison with our      current film.  2. I agree with the initiation of prednisone therapy begun yesterday,      respecting long-term concerns about the status of his pancreas with      steroid therapy.  3. He wheezes after mowing the lawn, which may reflect an allergic      asthma.  We will want to get pulmonary function tests.  4. He needs aggressive antiesophageal reflux management.  5. I will be happy to follow him again as requested, and we will make      an office appointment opportunity.  I will be happy to discuss his      care.      Clinton D. Maple Hudson, MD, [facility], FACP  Electronically Signed     CDY/MEDQ  D:  02/08/2007  T:  02/09/2007  Job:  [id number]   cc:   Gwen Pounds, MD  Mindi Slicker Lowell Guitar, M.D.

## 2011-01-19 NOTE — H&P (Signed)
NAMEJERYL, UMHOLTZ NO.:  [id number]   MEDICAL RECORD NO.:  [id number]          PATIENT TYPE:  EMS   LOCATION:  MAJO                         FACILITY:  [facility]   PHYSICIAN:  Noralyn Pick. Eden Emms, MD, FACCDATE OF BIRTH:  1956-05-27   DATE OF ADMISSION:  04/27/2009  DATE OF DISCHARGE:                              HISTORY & PHYSICAL   PRIMARY CARDIOLOGIST:  New to [facility], being seen by Dr.  Eden Emms.   PRIMARY CARE Pola Furno:  Gwen Pounds, MD   PULMONOLOGIST:  Rennis Chris. Maple Hudson, MD, Tonny Bollman, FACP   NEPHROLOGIST:  Mindi Slicker. Lowell Guitar, MD   PATIENT PROFILE:  A 55 year old African American male with a prior  history of pulmonary sarcoidosis who presents with chest pain.   PROBLEMS:  1. Chest pain.      a.     History of negative Myoview or negative stress test about 15       years ago.  2. Hypertension.  3. Hyperlipidemia.  4. Pulmonary sarcoidosis.  5. History of hypercalcemia.  6. History of nephrocalcinosis.  7. History of gallstone pancreatitis status post laparoscopic      cholecystectomy in September 2002 complicated by recurrent      retroperitoneal abscesses requiring a prolonged period of      antibiotics.  8. Attention deficit disorder.   HISTORY OF PRESENT ILLNESS:  A 55 year old African American male without  prior cardiac history.  He was in his usual state of health for  approximately 1-2 weeks ago when he began to experience resting 1-2/10  substernal chest pressure, occasionally radiating to left arm without  shortness of breath or other associated symptoms, maybe slightly worse  with exertion, lasting hours at a time and resolving spontaneously.  Symptoms have been occurring more frequently and yesterday he felt  poorly while mowing the lawn and this improved with some rest.  Last  night, he woke 3 times with substernal chest pressure and was very  restless.  Pain has been constant since 1:00 a.m.  He presented to  Urgent Care this morning  and then subsequently to the [facility] ED.  His cardiac markers are negative x1.  He has been treated with  supplemental nitroglycerin, although this has had no impact on his  discomfort.  Currently, he complains of 2-3/10 mild chest pressure.   ALLERGIES:  CODEINE.   HOME MEDICATIONS:  1. Diltiazem 30 mg daily.  2. Atenolol 25 mg daily.  3. Prednisone 5 mg daily.  4. Benicar 40 mg daily.  5. Aspirin/Goody powder p.r.n.  6. Amitiza daily.  7. Norvasc 5 mg daily.  8. Lasix 20 mg daily.  9. Protonix 40 mg daily.  10.Simvastatin 20 mg daily.   FAMILY HISTORY:  Mother is alive at age 65 with Alzheimer's.  Father  died at 59 of cancer and CVA.  His two sisters with hypertension.  One  brother is alive and well.   SOCIAL HISTORY:  He lives in [place] with his wife.  He works as Corporate investment banker third shift.  He has 1-2 pack-year history of use, quitting  about 15-20 years ago.  Denies alcohol or drug use.  He is not routinely  exercising.   REVIEW OF SYSTEMS:  Positive for fevers, chills, chest pain, shortness  of breath, and constipation.  Otherwise, all systems reviewed are  negative.  He is a full code.   PHYSICAL EXAMINATION:  VITAL SIGNS:  Temperature 99.4, heart rate 63,  respirations 12, blood pressure 154/97, and pulse ox 98% on 2 L.  GENERAL:  Pleasant African American male in no acute distress.  Awake,  alert, and oriented x3.  He has a normal affect.  HEENT:  Normal.  NEUROLOGIC:  Grossly intact, nonfocal.  SKIN:  Warm and dry without lesions or masses.  NECK:  Supple without bruits or JVD.  LUNGS:  Respirations are regular and unlabored.  Clear to auscultation.  CARDIAC:  Regular S1-S2.  No S3, S4, or murmurs.  ABDOMEN:  Round, soft, nontender, and nondistended.  Bowel sounds  present x4.  EXTREMITIES:  Warm and dry.  No clubbing, cyanosis, or edema.  Dorsalis  pedis and posterior tibial pulses 2+ and equal bilaterally.   IMAGING:  Chest x-ray shows bibasilar  interstitial changes, stable mild  cardiomegaly, no acute abnormality.  EKG:  Sinus rhythm, rate of 64,  biphasic Ts in V3.   LABORATORY DATA:  Hemoglobin 14.2, hematocrit 41.6, WBC 7.1, and  platelets 193.  Sodium 142, potassium 3.6, chloride 108, CO2 27, BUN 9,  creatinine 1.35, and glucose 103.  AST 28, ALT 46, albumin 3.4, and  lipase 48.  CK-MB less than 1.0 and troponin I less than 0.05.   ASSESSMENT AND PLAN:  1. Chest pain, typical and atypical symptoms.  He has been having pain      for 11 hours now.  Point-of-care markers negative so far.  We plan      to admit and cycle cardiac enzymes.  If negative, plan home in the      a.m. with outpatient Myoview and echo.  2. Sarcoidosis, followed by Dr. Maple Hudson.  No pleuritic chest pain      symptoms at this time.  We will check echo as outpatient to rule      out cardiac involvement.  Check noncontrast chest CT here.      Continue prednisone.  3. Hypertension.  Blood pressure is elevated here in the ED.  He did      not take his medications this morning.  We will resume his home      medications and adjust as necessary.  Of note, he is on 2 calcium      channel blockers.  I will go ahead and increase his diltiazem and      hold off on his Norvasc for now.      Nicolasa Ducking, ANP      Noralyn Pick. Eden Emms, MD, [facility]  Electronically Signed    CB/MEDQ  D:  04/27/2009  T:  04/28/2009  Job:  [id number]

## 2011-01-19 NOTE — Consult Note (Signed)
[name], [name]               ACCOUNT NO.:  [id number]   MEDICAL RECORD NO.:  [id number]          PATIENT TYPE:  INP   LOCATION:  4732                         FACILITY:  [facility]   PHYSICIAN:  Alvin C. Lowell Guitar, M.D.  DATE OF BIRTH:  20-Jul-1956   DATE OF CONSULTATION:  DATE OF DISCHARGE:                                 CONSULTATION   REFERRING PHYSICIAN:  Gwen Pounds, MD   I was asked by Dr. Timothy Lasso to see this 55 year old male with a history of  acute renal failure who required hemodialysis in 2002 as a result of  complications from acute gallstone pancreatitis.  Patient had severe  ARDS and ventilatory-dependent respiratory failure at that time.  He had  multiple surgical operations.  He fully recovered renal function with a  baseline creatinine according to Dr. Ferd Hibbs note of 1.3 to 1.5 mg/dl.  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  He is admitted on this occasion due to abnormal laboratory  studies.  He has had persistent hypercalcemia off-and-on since 2006 with  admission calcium of 13.2 and serum creatinine of 2.6 mg/dl and recent  negative outpatient workup for hypercalcemia.  Patient had recently been  placed on hydrochlorothiazide.  He was also taking multivitamins which  have vitamin D and was taking two to three times per day for heartburn.  Outpatient workup as mentioned was unremarkable and included a negative  SPEP, UPEP, vitamin D level and low parathyroid hormone level.  Throughout his hospitalization, the patient has been treated with  intravenous fluids.   PAST HISTORY:  1. Gallstone pancreatitis complicated by ARDS, acute renal failure,      ventilatory-dependent respiratory failure, multiple abdominal      surgeries and hemodialysis and CVVHD.  2. Status post cholecystectomy.  3. History of hypertension.  4. History of dyslipidemia.  5. Attention deficit disorder.  6. Status post hemorrhoidectomy.  7. Sarcoidosis.   CURRENT MEDICATIONS DURING THIS HOSPITALIZATION:  Include:  1. Diltiazem.  2. Tricor.  3. Wellbutrin.  4. Prednisone.  5. Nasonex.  6. Ambien p.r.n.   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   FAMILY HISTORY:  Negative for kidney disease.   PHYSICAL EXAMINATION:  VITAL SIGNS:  Blood pressure 138/90.  GENERAL:  Pleasant African-American male.  HEENT:  Atraumatic, normocephalic.  Mucous membranes are moist.  LUNGS:  Clear.  HEART:  Regular rhythm and rate.  ABDOMEN:  Soft.  Midline abdominal scar well healed.  No  hepatosplenomegaly.  EXTREMITIES:  No edema.  NEUROLOGIC:  No focality, oriented x3.   Urinalysis:  7-10 red blood cells, no protein.   CT scan:  Kidneys are symmetric.   Calcium 11.9, PTH 3.6, sodium 135, potassium 4.9, chloride 98, CO2 28,  BUN 17, creatinine 1.83.   ASSESSMENT:  Acute renal failure with microhematuria.  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  The hypovolemia possibly  caused by the hypercalcemia.   RECOMMENDATIONS:  1. I agree with steroids.  2. Recheck urinalysis.  3. May need urologic evaluation if hematuria does not resolve.  4. Keep off ACE inhibitor and hydrochlorothiazide as you are doing.  5. Patient instructed regarding discontinuing Tums.  6. Patient's wife will bring in a bottle of multivitamin for      examination for quantity of vitamin D.  7. If sarcoid renal disease is present, steroids should help.  8. I see no reason for renal biopsy at this time.      Mindi Slicker. Lowell Guitar, M.D.  Electronically Signed     ACP/MEDQ  D:  02/08/2007  T:  02/08/2007  Job:   [id number]

## 2011-01-19 NOTE — Discharge Summary (Signed)
NAMEFARAAZ, WOLIN               ACCOUNT NO.:  [id number]   MEDICAL RECORD NO.:  [id number]          PATIENT TYPE:  INP   LOCATION:  3713                         FACILITY:  [facility]   PHYSICIAN:  Noralyn Pick. Eden Emms, MD, FACCDATE OF BIRTH:  05/14/1956   DATE OF ADMISSION:  04/27/2009  DATE OF DISCHARGE:  04/28/2009                               DISCHARGE SUMMARY   PRIMARY CARDIOLOGIST:  Theron Arista C. Eden Emms, MD, [facility]   PRIMARY CARE Camdon Saetern:  Gwen Pounds, MD   PRIMARY PULMONOLOGIST:  Rennis Chris. Maple Hudson, MD, FCCP, FACP   DISCHARGE DIAGNOSIS:  Chest pain.   SECONDARY DIAGNOSES:  1. History of pulmonary sarcoidosis.  2. Hypertension.  3. Hyperlipidemia.  4. History of hypercalcemia.  5. History of nephrocalcinosis.  6. History of gallstone pancreatitis status post laparoscopic      cholecystectomy in December 2002.  7. History of recurrent retroperitoneal abscesses.   ALLERGIES:  CODEINE.   PROCEDURES:  None.   HISTORY OF PRESENT ILLNESS:  A 55 year old African American male without  prior cardiac history.  He was in his usual state of health for  approximately 1-2 weeks prior to admission when he began to experience  predominantly resting 1-2/10 substernal chest pain and pressure  occasionally radiating down his left arm without associated symptoms  lasting several hours at time and resolving spontaneously.  He had more  frequent separate episodes over the past 2 days prompting presentation  to the ER.  In the ED, he continued to complain of slight chest pain  which was not reproducible.  His ECG was unremarkable and cardiac  markers were negative.  He was admitted for evaluation.   HOSPITAL COURSE:  The patient ruled out for MI.  He eventually did  achieve relief of chest discomfort without specific intervention.  We  have arranged for an outpatient exercise Myoview in our office this  morning.   DISCHARGE LABORATORY DATA:  Hemoglobin 13.6, hematocrit 40.3, WBC 8.2,  and  platelets 214.  Sodium 141, potassium 3.4, chloride 106, CO2 27, BUN  12, creatinine 1.30, and glucose 112.  Total bilirubin 0.6, alkaline  phosphatase 62, AST 22, ALT 39, total protein 6.7, albumin 3.2, calcium  9.5, and lipase 48.  CK 41, MB 0.9, and troponin I 0.01.  Total  cholesterol 139, triglycerides 341, HDL 27, and LDL 64.  TSH 0.689.   DISPOSITION:  The patient will be discharged home today in good  condition.   FOLLOWUP PLANS AND APPOINTMENTS:  We have arranged for a followup stress  test in our office this morning at 9:45.  He will follow with Dr. Eden Emms  on May 21, 2009 at 2:15 p.m.  We have asked to follow up with Dr.  Maple Hudson regarding his sarcoidosis.   DISCHARGE MEDICATIONS:  1. Enteric-coated aspirin 81 mg daily.  2. Nitroglycerin 0.4 mg sublingual p.r.n. chest pain.  3. Olmesartan 40 mg daily.  4. Cardizem CD 360 mg daily.  5. Ambien 10 mg at bedtime.  6. Atenolol 25 mg daily.  7. Benadryl 25 mg daily p.r.n.  8. Four-way inhaler over-the-counter 1-2 puffs daily  p.r.n.  9. Lasix 20 mg daily.  10.Prednisone 10 mg half a tablet daily.  11.Protonix 40 mg daily p.r.n.  12.Simvastatin 20 mg daily.   OUTSTANDING LABORATORY WORKUP AND STUDIES:  Stress test is pending.   DURATION OF DISCHARGE ENCOUNTER:  35 minutes including physician time.      Nicolasa Ducking, ANP      Noralyn Pick. Eden Emms, MD, [facility]  Electronically Signed    CB/MEDQ  D:  04/28/2009  T:  04/28/2009  Job:  [id number]   cc:   Gwen Pounds, MD  Clinton D. Maple Hudson, MD, FCCP, FACP

## 2011-01-19 NOTE — Assessment & Plan Note (Signed)
[facility]                             PULMONARY OFFICE NOTE   NAME:[name], [name]                      MRN:          [id number]  DATE:05/11/2007                            DOB:          December 30, 1955    PROBLEM LIST:  1. Sarcoid (nephrocalcinosis/hypercalcemia).  2. History of gallstone pancreatitis/adult respiratory distress      syndrome.  3. Hypertension.  4. Hyperlipidemia.   HISTORY:  This gentleman had been seen in the hospital in June with  impression that his pulmonary sarcoid status was probable stable at that  time.  He comes now describing occasional low grade fever and aching.  He sometimes wheezes a little when he is lying down, but otherwise  breathing feels comfortable.  Prednisone has been tapered from 40 mg  daily to 5 mg daily.  Bone density checked last week showed some  osteopenia and he is starting Fosamax.  He denies rash, jaundice,  adenopathy, or acute problems.  He had been seen for sarcoid with hilar  adenopathy in the mid 1990's.  At that time retroperitoneal lymph node  needle biopsy had shown granulomatous inflammation consistent with  sarcoid.  A chest CT in June of 2004 had shown multifocal pulmonary  nodules and nodular infiltrate especially in the right middle lobe with  mediastinal and hilar adenopathy.   MEDICATIONS:  1. Prednisone 5 mg daily.  2. Zocor.  3. Lasix.  4. Cardizem CD 300 mg.  5. Protonix.  6. Ambien 10 mg q.h.s. p.r.n.   ALLERGIES:  CODEINE.   OBJECTIVE:  VITAL SIGNS:  Weight 156 pounds, blood pressure 150/92,  pulse 90, room air saturation 95%.  GENERAL:  He seems comfortable today.  I find no adenopathy.  HEART:  Sounds are regular without murmur.  LUNGS:  Lung fields sound really quite clear.   IMPRESSION:  Sarcoid.  He has had a number of complications particularly  related to calcium level and renal function.   PLAN:  ACE level.  Schedule PFT and chest x-ray.  Try reducing  prednisone to 5 mg every other day.  Schedule return in 2 months,  earlier p.r.n.     Clinton D. Maple Hudson, MD, Tonny Bollman, FACP  Electronically Signed    CDY/MedQ  DD: 05/14/2007  DT: 05/14/2007  Job #: [id number]   cc:   Gwen Pounds, MD

## 2011-01-22 NOTE — Consult Note (Signed)
[name], [name]                         ACCOUNT NO.:  [id number]   MEDICAL RECORD NO.:  [id number]                   PATIENT TYPE:  OBV   LOCATION:  5704                                 FACILITY:  [facility]   PHYSICIAN:  Petra Kuba, M.D.                 DATE OF BIRTH:  Sep 10, 1955   DATE OF CONSULTATION:  10/01/2002  DATE OF DISCHARGE:                                   CONSULTATION   HISTORY:  The patient is seen at the request of Dr. Timothy Lasso for abdominal  abscess. The patient well known to me from previous hospitalization in 2002,  although I have not seen him in a year or two in the office. He had been  working and doing well, although over the last two weeks developed some  looser stools and some left upper quadrant pain and had increased fever. CAT  scan lead to a diagnosis of an abscess done on [facility] on Monday which  lead to drainage today when he was admitted for further workup and plans. I  have not yet rechecked our office chart but plan to, to find out what his  last CAT scan was. It sounds like other than a lung biopsy for sarcoid in  the summer has not had any other medical problems except for being treated  with a Z-Pack about two weeks ago for an upper respiratory tract infection.  He has changed his diet slightly and that may have affected his looser  stools. He had not had any endoscopies, colonoscopies, or other GI tests to  his or his wife's knowledge.   PAST MEDICAL HISTORY:  Pertinent for the sarcoid as well as a severe bout of  pancreatitis in 2002 complicated with ARDS and I believe renal failure. He  has had his gallbladder out and umbilical hernia repair as well as  hypertension.   CURRENT MEDICATIONS:  1. Tylenol.  2. Altace.  3. Diltiazem.   ALLERGIES:  CODEINE, OXYCODONE, and VICODIN.   FAMILY HISTORY:  Noncontributory.   SOCIAL HISTORY:  Does not smoke or drink and had been working up until this  above event.   REVIEW OF SYSTEMS:   Review of systems is pertinent for the numbness in his  feet is almost gone, rarely bothers him. He is able to walk and work.   PHYSICAL EXAMINATION:  GENERAL:  Vital signs in chart. Fever of 102 in no  acute distress.  ABDOMEN:  Exam pertinent for his abdomen being soft. He is tender around the  drain as well as in the periumbilical area but no guarding or rebound  elsewhere. Good bowel sounds.   LABORATORY DATA:  CBC pertinent for white count of 11. Other labs pending at  time of dictation. CAT scan done today, have not yet reviewed but plan to.   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   PLAN:  Will broaden the antibiotics and add Flagyl to his Cipro. Will review  the CT as above. Follow his labs. Might need Dr. Magnus Ivan to see the  patient; he is familiar with him. Might need something like MRCP to see if  there is a duct abnormality or an obvious __________, particularly if this  is a pseudocyst infection. Possibly he will  need an upper GI small-bowel follow through or barium enema to rule out any  fistulas connecting to the bowels. Possibly before removing the drain, we  will inject it to see if it connects with anything. If he has had a  colonoscopy, I will ask Dr. Timothy Lasso to try to get me that report. We will  follow with you.                                               Petra Kuba, M.D.    MEM/MEDQ  D:  10/02/2003  T:  10/03/2003  Job:  [id number]   cc:   Abigail Miyamoto, M.D.  [address]  [place]  [place] [address]  Fax: [phone]

## 2011-01-22 NOTE — Discharge Summary (Signed)
NAMEANSEN, SAYEGH                         ACCOUNT NO.:  [id number]   MEDICAL RECORD NO.:  [id number]                   PATIENT TYPE:  INP   LOCATION:  0377                                 FACILITY:  [facility]   PHYSICIAN:  Abigail Miyamoto, M.D.              DATE OF BIRTH:  08-03-1956   DATE OF ADMISSION:  01/24/2004  DATE OF DISCHARGE:  01/30/2004                                 DISCHARGE SUMMARY   DISCHARGE DIAGNOSES:  1. Retroperitoneal abscess.  2. History of hypertension.   SUMMARY OF HISTORY:  [name] is a 55 year old gentleman, who  presented in January with retroperitoneal abscess which was percutaneously  drained.  It did not appear to communicate with any other abdominal  structure, and abscess studies with contrast confirmed this.  Several weeks  later, the abscess again persisted and returned, and this time a work-up  including MRCP and colonoscopy was again performed which showed no  communications with any bowel.  At this point, though, given the recurrence,  the decision was made to proceed with exploratory laparotomy was done and,  again, multiple drains were left in the abscess cavity as it was thoroughly  washed out.  The patient appeared to do well and doing well at home with all  drains out until Jan 24, 2004, when he started having abdominal pain and  fever.  He reported the pain was mostly in his extreme left flank.  He was  admitted with a fever and a normal white blood count but was found to have,  on a CAT scan, a recurrence of the abscess which was a little less than 3 cm  in size.  Given these findings, the decision was made to admit the patient  to the hospital on IV antibiotics.   HOSPITAL COURSE:  The patient was admitted and placed on IV Primaxin.  He  quickly defervesced and, again, his white count remained normal throughout.  After review of films, he was taken down to CAT scan with percutaneous  drainage of the abscess.  The drain was so  small that a catheter could not  be left in place.  A PICC line was placed also at this time.  He did well  after this and after further discussion, the decision was made to discharge  him home with the drains in place on IV antibiotics via the PICC line.  Cultures did grow both gram-positive cocci, gram-positive rods, and gram-  negative rods.  Given his improvement, the decision was made to discharge  him, again as mentioned.   DISCHARGE DIET:  Regular.   DISCHARGE ACTIVITY:  As tolerated.  He is on hold on returning to work until  we finish the IV antibiotics.   MEDICATIONS:  1. Primaxin 750 mg IV q.8h.  2. He will resume his home medications.   He may shower.  He will follow up with me 4 days after  discharge.                                               Abigail Miyamoto, M.D.    DB/MEDQ  D:  02/10/2004  T:  02/10/2004  Job:  [id number]

## 2011-01-22 NOTE — Procedures (Signed)
[place]  Patient:    [name], [name]                        MRN: [id number] Proc. Date: 03/22/01 Adm. Date:  03/22/01 Attending:  Judie Petit, M.D.                           Procedure Report  PROCEDURE:  Intubation.  BRIEF HISTORY:  [name] is a 55 year old male with multiple medical problems.  The anesthesia team was called for an emergent intubation for this patient in respiratory distress and combative.  The case was briefly discussed with Dr. Johna Sheriff who was present.  The patient was intubated with ______ 300 mg, succinylcholine 140 mg with rapid sequence technique with cricoid pressure.  Positive bilateral breath sounds, positive CO2 by Easy ______ on the first attempt.  A #8 endotracheal tube was placed. The tube was secured 22 cm at the lip by respiratory therapy.  Bilateral breath sounds were noted afterwards.  PLAN:  Chest x-ray pending.   Respiratory care per Dr. Johna Sheriff and ICU staff. DD:  03/22/01 TD:  03/22/01 Job: [id number]

## 2011-01-22 NOTE — H&P (Signed)
[name], [name]                         ACCOUNT NO.:  [id number]   MEDICAL RECORD NO.:  [id number]                   PATIENT TYPE:  INP   LOCATION:  0377                                 FACILITY:  [facility]   PHYSICIAN:  Anselm Pancoast. Zachery Dakins, M.D.          DATE OF BIRTH:  1955-10-21   DATE OF ADMISSION:  01/24/2004  DATE OF DISCHARGE:                                HISTORY & PHYSICAL   HISTORY:  [name] is a 55 year old black male patient of Dr.  __________ hospitalized back in April with an infrapancreatic abscess that  occurred following a problem with pancreatitis approximately a year earlier  and he was hospitalized for persistent problems. He recovered, but developed  this abscess in the tail of the pancreas inferior area that had been  percutaneously draining, but since he was still having problems he was  hospitalized and operated on by Dr. Rayburn Ma. Drains were placed and the  area debrided. The patient was discharged approximately two weeks later and  has been on Augmentin up until approximately three days ago, but over the  last week has had intermittent low grade fever and states that he is getting  more sore in the left upper abdomen again. The patient also is known to have  sarcoidosis and this has been noted on the previous CT. He is not presently  on any treatment for it and he has been on steroids for this, but whether  that is factor, it has kind of persisted the abscess in the pancreatic area.  The patient came to the emergency room in the late evening of Jan 24, 2004,  with these symptoms and was seen by the ER physician who obtained lab  studies that were not abnormal, white count 9300 with ___________. CT was  performed with findings of about a 3 cm abscess along the inferior margin of  the tail of the pancreas, across the area of the surrounding cecum. From Dr.  Doroteo Glassman operative note I am not sure how the drain was placed, whether it  was lateral or  over the colon. The previous radiology percutaneous drain had  been placed inferior to the colon, but in very proximity to the colon. He  also had a colonoscopy some time in April that did not show any area of  abnormality with the exception that immediately adjacent to the colon of the  drain area.   His chronic medication has included Darvocet and Advil for pain, Augmentin  875 mg b.i.d. up until about three days ago, Altace 10 mg b.i.d.,  hydrochlorothiazide 20 mg a day, and some sleep medication.  Also Cardizem  CD with __________.  Please refer to the past medical history on his  previous hospital admission.   PHYSICAL EXAMINATION:  VITAL SIGNS: Temperature 100.9, pulse 94, blood  pressure 142/73.  He is 6 feet tall and weighs 203 pounds.  LUNGS: He has good breath sounds bilaterally.  CARDIAC: No tachycardia. He is a little tender with deep palpation over the  left rib area and you can see where he has little drains in the immediate  area. He has a midline incision that appears to have healed nicely. He is  nontender in the lower abdomen.  EXTREMITIES: No evidence of pedal edema or thrombophlebitis.   ADMISSION IMPRESSION:  1. Recurrent abscess inferior to the tail of the Pancrease.  2. History of sarcoidosis.  3. Hypertension.   PLAN:  We have started him on Unasyn, but he continued to have low-grade  fever and will switch to Primaxin. Her blood cultures are pending at this  time.  Hopefully, this will defervesce and whether he will need repeat  percutaneous drainage is a question. The patient is certainly not toxic  appearing and whether the past history of sarcoidosis and lymphadenopathy  can contribute any to the persistence or difficulty in this resolving is in  question.                                               Anselm Pancoast. Zachery Dakins, M.D.    WJW/MEDQ  D:  01/25/2004  T:  01/25/2004  Job:  [id number]

## 2011-01-22 NOTE — Op Note (Signed)
[name], EDMONDS                         ACCOUNT NO.:  [id number]   MEDICAL RECORD NO.:  [id number]                   PATIENT TYPE:  INP   LOCATION:  5014                                 FACILITY:  [facility]   PHYSICIAN:  Petra Kuba, M.D.                 DATE OF BIRTH:  22-Jan-1956   DATE OF PROCEDURE:  DATE OF DISCHARGE:                                 OPERATIVE REPORT   PROCEDURE:  Colonoscopy.   ENDOSCOPIST:  Petra Kuba, M.D.   INDICATION:  Abnormal  CT scan, want to rule out colon problem causing  recurrent abscesses.   INFORMED CONSENT:  Consent was signed after risks, benefits, methods and  options were thoroughly discussed multiple times in the past.   MEDICATIONS USED:  Demerol 100 mg, Versed 10 mg.   PROCEDURE:  Based on his drain, with him on his back, the video colonoscope  was inserted and with some abdominal pressure and then rolling him on his  right side, it was able to be advanced to the cecum.  No obvious abnormality  was seen on insertion.  The prep was only fair.  The cecum was identified by  the appendiceal orifice and the ileocecal valve; in fact, the scope was  inserted a short ways into the terminal ileum, which was normal.  Photo  documentation was obtained and the scope was slowly withdrawn.  With over a  liter of washing and suctioning, adequate visualization was obtained,  although there was a little stool which would obscure parts of the colon.  Upon slow withdrawal through the colon, other than a minimal area of  possible descending erythema, although this was difficult to know for sure  because we could not roll him on his left side and maximally inflate this  area, based on him being on his back, but other than that one area, no other  abnormalities were seen as we slowly withdrew back to the rectum.  Again,  based on him being on his back and unable to maximally inflate his rectum,  we elected not to retroflex.  Anorectal pull-through did  not reveal any  additional findings.  The scope was inserted a short ways back up the left  side of the colon, air was suctioned and scope removed.  The patient  tolerated the procedure well.  There was no obvious immediate complication.   ENDOSCOPIC DIAGNOSIS:  1. Questionable minimal area of erythema in the descending but unable to     roll on his left side and maximally inflate of questionable significance.  2. Otherwise within normal limits to the terminal ileum.   PLAN:  Plan per Dr. Abigail Miyamoto.  Would recheck his colon screening in  5 years.  Would go ahead and do a full colon prep at the time of surgery,  just in case, but I doubt the colon is related to this problem.  Let  me know  if I can be of any further assistance.  I have discussed the above with Dr.  Gwen Pounds.                                               Petra Kuba, M.D.    MEM/MEDQ  D:  11/28/2003  T:  11/29/2003  Job:  [id number]   cc:   Gwen Pounds, M.D.  [address]  [place]  [place] [address]  Fax: [phone]   Abigail Miyamoto, M.D.  [address]  [place]  [place] [address]  Fax: [phone]

## 2011-01-22 NOTE — Discharge Summary (Signed)
[place]. [facility]  Patient:    [name], [name] Visit Number: [id number] MRN: [id number]          Service Type: SUR Location: [id number] Attending Physician:  Shelly Rubenstein Dictated by:   Abigail Miyamoto, M.D. Admit Date:  03/19/2001 Discharge Date: 06/05/2001                             Discharge Summary  DISCHARGE DIAGNOSES:  1. Acute pancreatitis.  2. Choledocholithiasis with common bile duct obstruction.  3. Respiratory failure.  4. Hyperkalemia.  5. Acidosis.  6. Shock.  7. Respiratory failure.  8. Acute renal failure with need for dialysis.  9. Paralytic ileus. 10. Thrombocytopenia. 11. Septicemia. 12. Hypovolemia.  PROCEDURES:  Multiple central venous accesses as well as laparoscopic cholecystectomy and ERCP.  HISTORY:  [name] is a 55 year old gentleman who was admitted on March 19, 2001, with sudden onset of diffuse abdominal pain, nausea, and vomiting.  He was found, on examination, to have an abdomen that was diffusely tender.  His amylase, lipase, and LFTs were, however, unremarkable.  He did have an ultrasound which showed him to have cholelithiasis with a probable common bile duct stone and mildly dilated common bile duct.  HOSPITAL COURSE:  The patient was admitted and a gastroenterological consultation was obtained by Dr. Leary Roca.  At this point, the decision was made to proceed with an ERCP prior to cholecystectomy.  The patient underwent ERCP and no stone was identified.  By the next morning, the patient was having increased abdominal pain.  At this point, his amylase had climbed to 3000.  He was found to have a white blood count of 10.  His urine output decreased at this point.  He became dehydrated and tachycardic.  He was given multiple fluid boluses and by July 16, he continued to have decreased urine output as well as an elevation of white blood count, liver function tests and amylase. At this point, the  decision was made to proceed with CT scan of the abdomen and we transferred him to the intensive care unit.  At this point, it was felt he was having worsening pancreatitis.  It was felt that his pancreatitis was secondary to the common bile duct stone.  PICC line was placed when he was down in radiology.  A CT scan showed moderate pancreatitis with a large amount of intra-abdominal free fluid.  At this point, the patient was transferred to the intensive care unit.  He quickly declined from a cardiopulmonary standpoint that evening, as well as had fever and critical care was consulted. [name] was then electively intubated.  Because of his developing acute renal failure, nephrology was also called.  From this point on, the patient remained on a ventilator in the intensive care unit.  Lines were placed and he was started on CVVHD.  Because of his hypotension, he was maintained initially on dopamine and Levophed and IV antibiotics were continued.  TNA was also started from a nutrition standpoint.  He remained acidotic throughout this point as well.  He did have several episodes where he underwent short bradycardic and asystolic event which responded and was felt to be secondary to mucous plugging.  At this point, again, his care remained under mostly the critical care management and renal management who did very well in maintaining this situation.  During the hospitalization, multiple CT scans were performed which showed multiple  fluid collections, but no evidence of pancreatic necrosis.  Again, these were performed without IV contrast.  On July 23, one of these did show continued severe inflammation.  At this time, he still had a white blood count of 25,000 and remained relatively acidotic.  His nutrition was under control of pharmacy as well as the nutritionist managing his TNA. On July 24, he was continued on Zosyn as well as vancomycin.  The specific codes to which had happened earlier  occurred on April 04, 2001.  Again, this was a limited code and the patient quickly improved from that.  Over the next several weeks, he maintained in a _____ on multiple pressures.  He was taken to the operating room on April 10, 2001, where he underwent a tracheostomy. He made in _____ and acidotic shortly after this.  Tube feeds were attempted, however, because of high residuals he had to be maintained on TNA.  Again, more CT scans were performed which showed only fluid collections and no evidence of pancreatic necrosis.  Throughout his hospitalization, multiple transfusions were also performed secondary to his anemia of chronic disease. By late August, his renal output finally began to improve and his creatinine started to decrease, and by August 20, he was off CVVHD and he was tolerating tube feeds at 40 cc an hour.  His abdomen remained mildly tender.  At this point, sedation started to be weaned as well and he began tolerating this. Pulmonary critical care also started to make ground from a weaning standpoint from the ventilator.  Because of his continued fluid collections in the abdomen on CT scans, the decision was finally made to undergo CT-guided drainage of these fluid collections with catheter placement. Once this was done, his white count again continued to improve and his overall condition slowly started to improve as well.  He began making continued slow progress and began following commands.  Again, his ventilator was weaned further and he maintained adequate urine output.  Cultures on all his drains remained unremarkable.  By August 27, he began being placed in a chair in the intensive care unit in an upright position.  Physical therapy began working with him as he started to follow commands even more.  His amylase and lipase, on August 28, were normal as well as his white blood count.  His creatinine remained elevated.  At this point, he continued to tolerate his tube  feeds.  Again, more CT scans were performed which showed continued changes of severe pancreatitis.  By early September, [name] began making more strides on  improving his overall condition.  He was on only room air on his tracheostomy. He was putting out urine well and his strength was improving as well as all his laboratory data.  By September 7, his creatinine was down to 2.1.  At this point, with him following commands, he was placed on sips of liquids.  By September 20, he was continued on IV Cipro and Zosyn.  He began tolerating the sips of water and was advanced to juice and carbonated fluids which he again tolerated as well.  He was finally transferred out of the intensive care unit to the step-down unit.  He did have a fall while on step-down, but had no injuries from this.  His strength continued to improve and he began to improve from a mental status standpoint and became less confused.  At this point, he began tolerating a regular diet and his tracheostomy was removed.  He  did continue to have some residual left-sided abdominal pain.  Because of the continued improvement, the decision was finally made on May 26, 2001, to take him to the operating room where he underwent a laparoscopic cholecystectomy and intraoperative cholangiogram.  He was found to have a normal cholangiogram.  At this point, he tolerated the procedure well and was taken to a regular surgical floor.  He, at this point, continued to make great strides with the help of physical therapy.  His creatinine decreased to 1.3. His liver function tests remained normal.  His hemoglobin was stable at 9.0. His diet was again advanced to a low fat.  He began working well with this and we began making plans towards discharge.  Again, PMR was consulted and determined the patient could be managed with outpatient care.  By June 05, 2001, he was doing very well.  He was tolerating a regular diet.  He was ambulating  well.  His strength was much better, although he was still somewhat weak.  His abdomen was stable.  At this point, the decision was made to discharge him to home.  See above discharge diagnoses.  DISCHARGE ACTIVITY:  As tolerated.  He should do no heavy lifting.  Outpatient physical therapy is arranged.  DISCHARGE MEDICATIONS:  Demerol, Advil, and Tylenol for pain.  He will resume his home medications.  He will take Pancrease before meals.  He will take Ambien for sleep, Milk of Magnesia as needed for constipation as well as multivitamins.  He will take Ensure shakes as needed as well.  Outpatient physical therapy is arranged and he will follow up in my office in one week post discharge. Dictated by:   Abigail Miyamoto, M.D. Attending Physician:  Shelly Rubenstein DD:  08/10/01 TD:  08/10/01 Job: [id number]

## 2011-01-22 NOTE — Op Note (Signed)
[place]. [facility]  Patient:    [name], [name] Visit Number: [id number] MRN: [id number]          Service Type: SUR Location: 5700 [id number] [id number] Attending Physician:  Shelly Rubenstein Dictated by:   Abigail Miyamoto, M.D. Proc. Date: 05/26/01 Admit Date:  03/19/2001   CC:         Petra Kuba, M.D.   Operative Report  PREOPERATIVE DIAGNOSIS:  Gallstone pancreatitis.  POSTOPERATIVE DIAGNOSIS:  Gallstone pancreatitis.  PROCEDURE: Laparoscopic cholecystectomy with intraoperative cholangiogram.  SURGEON:  Abigail Miyamoto, M.D.  ASSISTANT:  Angelia Mould. Derrell Lolling, M.D.  ANESTHESIA:  General endotracheal anesthesia.  FINDINGS:  The patient was found to have a normal cholangiogram.  INDICATION:  Ansley Stanwood is a 55 year old gentleman who presented on March 19, 2001, with gallstone pancreatitis.  He was felt to have a common bile duct stone on ultrasound.  ERCP was performed at that point.  The patient subsequently developed worsening of his gallstone pancreatitis and eventually developed multisystem organ failure.  He had since then undergone a tracheostomy and had been on dialysis.  He has finally resolved these issues and has come off the trach and dialysis and has returned to almost normal function.  A decision has been made to proceed with a laparoscopic cholecystectomy.  DESCRIPTION OF PROCEDURE:  The patient was brought to the operating room, identified as [name].  He was placed supine on the operating room table, and general anesthesia was induced.  His abdomen was then prepped and draped in the usual sterile fashion.  Using a #15 blade, a small transverse incision was made below the level of the umbilicus.  The incision was carried down through the fascia with the scalpel.  A hemostat was then used to pass into the peritoneal cavity.  Next, a Vicryl pursestring suture was placed around the fascial opening.  The Hasson port was then  placed into the opening, and insufflation of the abdomen was begun.  Next, an 11 mm port placed in the patients epigastrium and two 5 mm ports were placed in the patients right flank under direct vision.  The gallbladder was found to be contracted and densely adherent with omentum stuck to it.  A lot of dissection was required bluntly in order to free up the adhesions around and to the gallbladder. Finally the base of the gallbladder was identified.  The cystic duct was then dissected out and clipped once proximally.  A small incision was then made in the duct.  An Angiocath was inserted into the right upper quadrant, and the cholangiocath was inserted into the cystic stump.  The cholangiogram was then performed, revealing a normal common bile duct without evidence of common bile duct stone.  At this point the catheter was removed.  The duct was then clipped three times proximally and transected with scissors.  The cystic artery was then identified along with the tributary and clipped twice proximally and once distally and transected as well.  The gallbladder was thoroughly dissected free from the liver bed with the electrocautery.  The gallbladder was entered during this dissection.  Stones were then removed after the gallbladder was removed with the forceps and the large sucker.  Once the gallbladder was completely removed, it was pulled out through the umbilicus after being placed in an Endosac.  Again the abdomen was copiously irrigated with normal saline.  The left flank was then identified and again no deep fluid collections could be identified laparoscopically.  At this point after irrigation, hemostasis appeared to be achieved.  All ports were then removed under direct vision, and the abdomen was deflated.  The incision of the umbilicus was then made a little larger to include the umbilical hernia. The fascia at the umbilicus then closed with several figure-of-eight 0  Vicryl sutures.  All incisions were then anesthetized with 0.25% Marcaine.  All skin incisions were then closed with subcuticular 4-0 Vicryl sutures. Steri-Strips, gauze, and tape were then applied.  The patient tolerated the procedure well.  All sponge, needle, and instrument counts were correct at the end of the procedure.  The patient was then extubated in the operating room and taken in stable condition to the recovery room. Dictated by:   Abigail Miyamoto, M.D. Attending Physician:  Shelly Rubenstein DD:  05/26/01 TD:  05/26/01 Job: [id number]

## 2011-01-22 NOTE — Consult Note (Signed)
[name], CROMIE                         ACCOUNT NO.:  [id number]   MEDICAL RECORD NO.:  [id number]                   PATIENT TYPE:  OBV   LOCATION:  5704                                 FACILITY:  [facility]   PHYSICIAN:  Ollen Gross. Vernell Morgans, M.D.              DATE OF BIRTH:  1955/12/27   DATE OF CONSULTATION:  10/03/2003  DATE OF DISCHARGE:                                   CONSULTATION   [name] is a 55 year old black male who has a history of gallstone  pancreatitis and pancreatic pseudocyst back in 2002.  He has apparently more  recently had a percutaneous biopsy of a retroperitoneal lymph node that  established a diagnosis of sarcoidosis.  He has been in pretty good health  until recently.  He represented with fevers and left upper quadrant pain.  He underwent a CT scan that showed a fluid collection associated with the  tail of the pancreas in the left upper quadrant.  This fluid collection was  percutaneously drained and appears to be consistent with an infected  pancreatic pseudocyst.  Since the drain was placed, his fevers have  improved.  He is currently resting pretty comfortably without many  complaints.   PAST SURGICAL HISTORY:  Significant for:  1. Sarcoidosis.  2. Gallstone pancreatitis.  3. Pancreatic pseudocyst.  4. ARDS.  5. Hypertension.  6. Hyperlipidemia.   PAST SURGICAL HISTORY:  Significant for:  1. Cholecystectomy.  2. Ventral hernia repair.  3. Hemorrhoidectomy.   MEDICATIONS:  Altace, Cardizem, and hydrochlorothiazide.   ALLERGIES:  1. CODEINE.  2. VICODIN.   SOCIAL HISTORY:  He denies the use of alcohol or tobacco products.   FAMILY HISTORY:  Significant for hypertension and stroke in his parents.   PHYSICAL EXAMINATION:  VITAL SIGNS:  His temperature maximum was 102.8  degrees, blood pressure 120/60, and pulse 105.  GENERAL APPEARANCE:  He is a well-developed, well-nourished, black male in  no acute distress.  SKIN:  Warm and dry with no  jaundice.  HEENT:  Eyes:  Extraocular movements are intact.  Pupils equal, round, and  reactive to light.  Sclerae nonicteric.  LUNGS:  Clear bilaterally with no use of accessory respiratory muscles.  HEART:  Regular rate and rhythm with an impulse in the left chest.  ABDOMEN:  Soft with some moderate left upper quadrant tenderness, but no  palpable mass or hepatosplenomegaly.  A percutaneous drain is in place.  EXTREMITIES:  No cyanosis, clubbing, or edema.  PSYCHOLOGIC:  He is alert and oriented.  He is a little bit confused, but he  just received Ambien for sleep.   ASSESSMENT AND PLAN:  This is a 55 year old black male who appears to most  likely have an infected pseudocyst or possibly a diverticular abscess  associated with the splenic flexure and tail of the pancreas.  The patient  has had a percutaneous drain placed, which he tolerated well.  Dr. Ewing Schlein has  also seen [name] and his work up is in progress.  We agree with placement  of the percutaneous drain and continuing his broad spectrum antibiotic  coverage.  We will continue to follow along with you and I will discuss this  with Dr. Magnus Ivan, who did his prior surgery.                                               Ollen Gross. Vernell Morgans, M.D.    PST/MEDQ  D:  10/03/2003  T:  10/03/2003  Job:  [id number]

## 2011-01-22 NOTE — Discharge Summary (Signed)
[name], [name]                         ACCOUNT NO.:  [id number]   MEDICAL RECORD NO.:  [id number]                   PATIENT TYPE:  INP   LOCATION:  5014                                 FACILITY:  [facility]   PHYSICIAN:  Gwen Pounds, M.D.                 DATE OF BIRTH:  1956-02-16   DATE OF ADMISSION:  11/20/2003  DATE OF DISCHARGE:  11/29/2003                                 DISCHARGE SUMMARY   PRIMARY CARE [place] Helminiak:  Dr. Gwen Pounds.   SURGEON:  Dr. Abigail Miyamoto.   GASTROENTEROLOGIST:  Dr. Petra Kuba.   INFECTIOUS DISEASE:  Dr. Tinnie Gens C. Hatcher.   DISCHARGE DIAGNOSES:  1. Intra-abdominal abscess.  2. Hypertension.  3. Elevated liver function tests.  4. Sarcoidosis.  5. Past history of gallstones pancreatitis with complications of adult     respiratory distress syndrome, tracheotomy, acute renal failure requiring     hemodialysis and pseudocyst requiring drains.  6. History of cholecystectomy.  7. History of presumed pancreatic pseudocyst abscess requiring a catheter     drainage and intravenous antibiotics back in January of 2005.  8. Hyperlipidemia.  9. Status post ventral hernia repair.  10.      Status post hemorrhoidectomy.   CURRENT MEDICATIONS:  1. Altace 10 mg b.i.d.  2. Cardizem CD 180 mg daily.  3. Hydrochlorothiazide 25 mg daily.  4. Omnicef 300 mg p.o. b.i.d. for 18 more days after 10 days of Zosyn has     been given.   DISCHARGE PROCEDURES:  1. CT-guided catheter drainage tube placed into intra-abdominal abscess.  2. Consultations from all above-mentioned physicians.  3. Colonoscopy done on November 28, 2003 with minimal area of erythema in the     descending colon, but because the patient was unable to roll on his left     side due to pain, this was not able to maximally inflate, but there     appeared to be no obvious colonic lesions.  4. Magnetic resonance cholangiopancreatogram done on November 26, 2003.  The     magnetic resonance  cholangiopancreatogram was considered normal in a     patient who has had a previous cholecystectomy and the liver, the     pancreas and the ducts all looked fine.  He did appear to have a colonic     lesion, which is why we went ahead and did the colonoscopy.   HISTORY OF PRESENT ILLNESS:  Briefly, [name] is a 55 year old male with  history of serious gallstone pancreatitis with multiple complications, who  was also treated for pancreatitis pseudocyst.  He had what appeared to be a  pancreatic abscess in January 2005 requiring IV and oral antibiotics for 4  weeks of treatment.  He had a CT-guided catheter drainage placed, which we  kept in for about 2 weeks; it was removed in early February.  During the  time with  him carrying around the catheter, he had it injected with dye and  it did not appear to be communicate with either the pancreas or the colon.  He came to medical attention on the date of November 20, 2003 with 2- to 3-day  history of really high temperatures, chills, nausea without vomiting, but  rigors, dehydration and diaphoresis.  He had a return of his left upper  quadrant pain.  He was admitted for further evaluation and treatment.  He  was given  Rocephin 1 g IM and Tylenol 1000 mg x1 in the office prior to  being sent over for admission.   HOSPITAL COURSE:  [name] was admitted on November 20, 2003 with what  appeared to be a recurrence of his pancreatic abscess.  He was admitted.  He  was placed on IV fluids/IV antibiotics.  A CT scan was ordered for the next  day under true interventional radiology with the ability to put a drainage  catheter in place if necessary.  Because there was persistent reaccumulation  of left retroperitoneal fluid collection, this drainage catheter was  replaced successfully.  After this, for the next few days, he continued to  spike really high temperatures, with the highest temperature in the hospital  of up to 104.6.  Surgery and infectious  disease were initially consulted and  helped with his management.  As there became more questions and as his LFTs  went out, we got Dr. Ewing Schlein to help also.  He underwent MRCP and colonoscopy,  with results stated above.  At this point, his LFTs are starting to improve.   We are at the point in his care where he will complete 28 days of total  antibiotics.  He will be discharged today with the drainage catheter still  in place.  He has not had a fever in several days, white count has never  spiked and he is back to his baseline, as far as physical exam.  He will  follow up with Dr. Magnus Ivan as an outpatient on Tuesday to discuss the  exploratory surgery with removal of the pancreatic pseudocyst and abscess.  During the surgery, he probably will run the bowel to see if there is any  true communication, which there has not appeared to be as of yet.  If he  does not get his surgery after this episode, the drainage catheter and  antibiotics will be addressed further and I assume if he gets one more of  these type of episodes, surgery will definitely be in his future.  During  the surgery, he may get a ventral hernia repaired.   As far as his LFTs are concerned, he had some laboratory data looking into  this, and he had a relatively normal alpha I antitrypsin level and normal CK  level, did not have hemochromatosis as identified by a normal iron and  normal ferritin levels.  His last LFTs showed AST of 102, ALT of 124,  albumin of 3 and total bilirubin of 0.6.  All blood cultures remained  negative.  He did have the abscess fluid cultured and it showed moderate  Proteus mirabilis and moderate E. coli, which were pretty pansensitive.  He  will be discharged on Omnicef to finish the 28 days of treatment.  The LFTs  will need to be followed as an outpatient.  One other piece of laboratory data that is important is the fluid amylase from the drainage catheter was  1729, which indicated that this  is  not communicating with the pancreatic  fluid at this time.   In summary, [name] was admitted for a recurrent intra-abdominal  abscess and underwent several procedures and now is ready for discharge in  stable condition.  His LFTs were need to be followed up as an outpatient.  The drainage catheter and antibiotics will be followed by myself and Dr.  Magnus Ivan and he may be headed toward surgery.   PAIN MANAGEMENT:  Pain management will be with ibuprofen.   DISCHARGE ACTIVITY:  Discharge activity as tolerated.   DISCHARGE DIET:  Heart-healthy.   AFTER-CARE FOLLOWUP INSTRUCTIONS:  He is to follow up with Dr. Timothy Lasso in 2-3  weeks and he is to follow up with Dr. Magnus Ivan next Tuesday and he is to  call [phone] for the appointment.                                                Gwen Pounds, M.D.    JMR/MEDQ  D:  11/29/2003  T:  11/29/2003  Job:  [id number]

## 2011-01-22 NOTE — Discharge Summary (Signed)
[name], [name] NO.:  [id number]   MEDICAL RECORD NO.:  [id number]          PATIENT TYPE:  INP   LOCATION:  4732                         FACILITY:  [facility]   PHYSICIAN:  Gwen Pounds, MD       DATE OF BIRTH:  1955-10-15   DATE OF ADMISSION:  02/07/2007  DATE OF DISCHARGE:  02/09/2007                               DISCHARGE SUMMARY   PRIMARY CARE [name]:  Gwen Pounds, MD   PULMONOLOGIST:  Manuel Chris. Maple Hudson, MD, [name]   GASTROENTEROLOGIST:  [name], M.D.   UROLOGIST:  [name], M.D.   SURGEON:  [name], M.D.   NEPHROLOGIST:  [name], M.D.   DISCHARGE DIAGNOSES:  1. Sarcoidosis.  2. Nephrocalcinosis.  3. Hypercalcemia.  4. Azotemia secondary to hypercalcemia, improved with treatment.  5. History of severe gallstone pancreatitis leading to sepsis ARDS,      acute renal failure for which he required hemodialysis.      Eventually, will need a cholecystectomy, tracheotomy and prolonged      ICU level care.  6. Hypertension.  7. Hyperlipidemia.  8. History of recurrent retroperitoneal abscesses status post I&D      drains, PICC-lines, antibiotics, etc.  9. ADD.   DISCHARGE MEDICATIONS:  1. Cardizem CD 300 mg p.o. daily.  2. Prednisone 40 mg p.o. daily.  3. Protonix 20 mg p.o. daily.  4. Lasix 20 mg p.o. q.a.m.  5. Zocor 20 mg p.o. daily.  6. Ambien 10 mg p.o. q.h.s.  7. Nasonex nasal spray two sprays q.h.s.  8. Hold TUMS.  9 . Hold multivitamins.   DISCHARGE INSTRUCTIONS:  He is to follow the instructions Nutrition gave  him.  He will need to see Ophthalmologist as an outpatient, and will  also need to see [name] as an outpatient to follow up microscopic  hematuria.  He is to exercise.  He is also to hold his Altace and  Benicar at this time.   DISCHARGE PROCEDURES:  1. Consultation with Nephrology.  2. Consultation with Pulmonology.  3. Abdominal and pelvic CT scan.  Abdominal and pelvic CT scan  shows      mediastinal and hilar adenopathy.  Patch equal fluid opacities      peripherally within the right mid lung with mixed reticular nodular      densities bilaterally.  Pulmonary nodules seen in the upper lungs      bilaterally.  Features are consistent with clinical diagnosis of      sarcoid.  Malignant process such as lymphoma, metastatic disease      are differential considerations.  There is extensive mesenteric or      retroperitoneal adenopathy without change.  Differential      consideration is unchanged from September 15, 2006, and includes      sarcoid lymphoma and metastatic disease and pelvic adenopathy is      also noted without any change.   HISTORY OF PRESENT ILLNESS:  Briefly, [name] is a patient  well-known to me.  He is 55 years old and  has biopsy proven sarcoid and  has seen  [name] in the past.  He has also had multiple pancreatic  pseudocyst after pretty significant gallstone pancreatitis in the past.  He was seen by me this year to include April 29, May 15 and followed up  on June 3 with visits included evaluations for hypertension,  hyperlipidemia and other visits, but laboratory data was obtained.  His  calcium levels have mainly been between 10.5 and 10.9.  We felt this was  slightly elevated due to her sarcoid, and because of the stability was  just being followed carefully.  On April 29, the calcium went to 12.1.  I had him drink plenty of fluids and come back on May 5 and it was 11.0  with an ionized calcium of 6.3.  I repeated his labs, and he had a 25  hydroxy vitamin E of 23.2 and an attack PTH which was appropriate  expressed at 12.7.  He had SPEP/DPEP which were both negative and total  protein was 8.3.  I gave him seven days of Lasix and had him follow up  with me.  He saw me in the office on February 07, 2007.  I ran some stat  blood work.  His BUN was 21, his creatinine was elevated at 2.6, and  calcium was very elevated at 13.2.  After  a long discussion, we decided  to admit him and evaluated for azotemia and hypercalcemia.  Physical  examination was relatively benign.   HOSPITAL COURSE:  [name] was admitted from my office with a  calcium level of 13.2 and a creatinine level of 2.6 compatible with  nephrocalcinosis secondary to the sarcoid leading to the azotemia.  I  gave him aggressive IV fluids.  I started him on Prednisone and got  multiple labs.  The only issue he had overnight was reflux, nausea,  vomiting and epigastric pain.  Otherwise, he is tolerating breakfast  early in the morning.  His BUN on the following morning on February 08, 2007,  was 17, creatinine 1.3, glucose 128, calcium down to 12.0.  Unfortunately, his urine did show 7-10 red blood cells per high-powered  field.  The rest of his labs were otherwise unremarkable.  PSA was 0.42,  LVH was 282, albumin was 3.6.  white count and hemoglobin were fine.  The plan was to continue him on the fluids, the Lasix and the  prednisone.  Get Nephrology and Pulmonary consultations and follow up on  a CT abdomen and pelvis.  His blood pressure remained surprisingly  decent off of his ACE inhibitor and ARB.  These were stopped due to the  azotemia.  Due to the prednisone, it was also felt that nutrition needed  to see him so that he could work on weight loss and not gain weight due  to the steroidal issues.  Dr. 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  He agreed with the  steroids.  He agreed with following urinalysis.  He felt that GU  evaluation as an outpatient was appropriate.  He agreed with keeping off  the ACE inhibitor and hydrochlorothiazide as we were doing.  We  discontinued the TUMS, as the patient did not need any excess calcium.  If this is only sarcoid renal disease,  the steroids should help  dramatically,  and he felt there was no reason for any underlying renal  biopsy at the time.   Nutrition came by, and she provided education and hand out and overall  healthy eating for diabetic diet, diabetic snack options, basic  carbohydrate counting and label reading and tips, and she expected fair  compliance.   Later that day, Dr. Fannie Knee saw him, and his impression was sarcoid,  doubt much pulmonary progression.  Agree with steroids, and he felt that  he was going to go down and talk with Radiology to compare the CT scans  from going back all the way to 2004 to see if there was any progression.  I also felt that pulmonary function tests needed to be done but should  be done as an outpatient and wanted to know what his current ACE level  was and wanted appropriate aggressive reflux esophagitis control with  PPIs.   The following morning, his BUN was 19, creatinine was 1.49.  Calcium was  11.2, sed rate was 63.  Parathyroid hormone intact was 3.6. CT scan was  completely reviewed and reported above.  Therefore, his hypercalcemia  and __________ continued to resolve.  My concern at this time was his  massive lymphadenopathy throughout his body.  I guess the underlying  theme was that we know he has sarcoid, but we know that there really has  not been that much in the way of progression in the last several years,  especially since his last biopsy proven sarcoid was done, and that if  this was a lymphoma or some underlying cancer without any treatment over  the last 4-5 years, he would no longer be around.  Therefore, the plan  to rebiopsy at this time was undertaken.   Because the patient had hyperlipidemia and is now on current steroids  and has chronic inflammation and he has tolerated the Tricor, we have  switched him over from Tricor to Zocor.   Later, on February 09, 2007, we signed off, and since Mr. Petrich was doing  appropriate, this can continue  to be followed as an outpatient and  appropriately worked up.  Other important labs to note.  All of his  liver tests were normal except for an alk-phos of 175.  LDH was high at  282.  Phosphorous level was 1.8.  A CA 19-9 level was not detectable.  A  PTH related peptide was less than 0.2.  A CEA level was less than 0.05.  PSA level was fine with a normal free PSA.  One 25-hydroxy-vitamin D was  extremely elevated at 120 which correlates very well with sarcoid  nephrocalcinosis.  The patient will be followed up appropriately.      Gwen Pounds, MD  Electronically Signed     JMR/MEDQ  D:  03/24/2007  T:  03/24/2007  Job:  [id number]   cc:   Joni Fears D. Maple Hudson, MD, FCCP, [name]  [name], M.D.  Ronald L. Earlene [name], M.D.  Manuel Dare Janee [name], M.D.  [name], M.D.

## 2011-01-22 NOTE — Procedures (Signed)
[place]. [facility]  Patient:    [name], [name]                        MRN: [id number] Proc. Date: 03/19/01 Attending:  Petra Kuba, M.D. CC:         Abigail Miyamoto, M.D.   Procedure Report  PROCEDURE:  Endoscopic retrograde cholangiopancreatography.  ENDOSCOPIST:  Petra Kuba, M.D.  INDICATIONS:  Patient with abdominal pain, gallstones, questionable CDDB stones on ultrasound.  Requested by Dr. Magnus Ivan to proceed prior to laparoscopic cholecystectomy.  INFORMED CONSENT:  Consent was signed after risks, benefits, methods and options were thoroughly discussed prior to any premedications given with both the patient and his wife.  MEDICATIONS GIVEN:  Demerol 30 mg, Versed 7 mg, Glucagon 0.5 mg.  DESCRIPTION OF PROCEDURE:  The side-viewing therapeutic video duodenoscope was inserted by indirect vision into the stomach.  There was lots of food in there which could not be washed and suctioned.  We were able to roll him on his side and able to advance through a normal antrum, normal pylorus, and to a normal duodenal bulb, and a normal-appearing ampulla is brought into view.  There was some bile draining.  He did have a very small periampullary diverticula. Unfortunately, multiple PD injections were done as we tried both the regular and two different tapered sphincterotomes in an effort to obtain CBD cannulation.  We also probed using the JAT Jagwire without success.  The PD was normal.  Finally, we were able to advance the wire using the triple lumen sphincterotome into the CBD and inject contrast.  There was some contrast injected into the cystic duct.  The CBD was normal.  The intrahepatics were normal.  Based on not finding anything, we elected not to proceed with a sphincterotomy.  The wire and sphincterotome were removed.  The scope was removed.  The patient tolerated the procedure well.  There was no obvious immediate complication.  ENDOSCOPIC  DIAGNOSES: 1. Normal pancreatic duct via multiple injections. 2. Normal common bile duct and minimal cystic duct filling with normal minimal    intrahepatic filling. 3. Normal ampulla except for small periampullary diverticula. 4. Increased food in the stomach, therefore not well seen.  PLAN:  Observe for delayed complications.  Dr. Magnus Ivan was present for the end of the ERCP and agreed with the normal cholangiogram.  If not complications, particularly pancreatitis, will proceed with a laparoscopic cholecystectomy tomorrow.  I will be on standby to help p.r.n.  I have discussed all of the above with the patient, the wife and the family. DD:  03/19/01 TD:  03/19/01 Job: [id number]

## 2011-01-22 NOTE — H&P (Signed)
[name], [name]                         ACCOUNT NO.:  [id number]   MEDICAL RECORD NO.:  [id number]                   PATIENT TYPE:  INP   LOCATION:  5014                                 FACILITY:  [facility]   PHYSICIAN:  Gwen Pounds, M.D.                 DATE OF BIRTH:  07-18-1956   DATE OF ADMISSION:  11/20/2003  DATE OF DISCHARGE:                                HISTORY & PHYSICAL   PRIMARY CARE PHYSICIAN:  Dr. Gwen Pounds.   SURGEON:  Dr. Abigail Miyamoto.   CHIEF COMPLAINT:  Fever, abdominal pain, chills and dehydration.   HISTORY OF PRESENT ILLNESS:  A 55 year old man with a history of  pancreatitis where he nearly passed away.  This was followed by a pseudocyst  and drains.  He had a pancreatic abscess in January of 2005 requiring IV and  oral antibiotics for four weeks of treatment.  A drain was placed and left  in for about two weeks.  It was removed in early February.  He comes in  today with fever times 24-48 hours, but temperature up to 103, chills,  nausea, no vomiting, rigors, dehydration, diaphoresis, left upper quadrant  pain and no other symptoms.  He denies chest pain, cough, shortness of  breath, urinary or bowel issues.  He will be admitted for further evaluation  and treatment.  Given Rocephin 1 gram IM and Tylenol 1000 mg x1 in the  office prior to being _____for admission.   PAST MEDICAL HISTORY:  1. Gallstone pancreatitis with complications of adult respiratory distress     syndrome.  2. Tracheotomy.  3. Acute renal failure requiring hemodialysis.  4. Cholecystectomy.  5. Pseudocyst requiring drain.  6. History of pancreatic abscess status post drain and IV antibiotics.  7. Hypertension.  8. Hyperlipidemia.  9. Status post ventral hernia repair.  10.      Hemorrhoidectomy.  11.      Sarcoidosis.   SOCIAL HISTORY:  He is married.  He has two daughters.  He is a Naval architect  for [employer].  No tobacco, no alcohol.   FAMILY HISTORY:  Hypertension and  stroke.   MEDICATIONS:  1. Altace 10 b.i.d.  2. Cardizem CD 180 daily.  3. Hydrochlorothiazide 25 daily.   ALLERGIES:  CODEINE AND VICODIN.   REVIEW OF SYSTEMS:  He denies any chest pain, shortness of breath, bright  red blood per rectum, diarrhea or vomiting.  There has been some nausea.  Positive left upper-quadrant pain.  See HPI for details.  All other organ  systems reviewed and negative.   PHYSICAL EXAMINATION:  VITAL SIGNS:  Temperature 102.3, heart rate 88,  respiratory rate 16.  Weight 243 pounds.  Blood pressure 138/72.  GENERAL:  Alert and oriented x3.  CARDIAC:  Regular and tachycardic.  SKIN:  Diaphoresis.  PULMONARY:  Clear to auscultation bilaterally.  ABDOMEN:  Very tender in the left  upper quadrant and diffusely all over.  EXTREMITIES:  No edema.  NECK:  Tracheostomy scar is noted.  HEENT:  Oropharynx is moist.  EXTREMITIES:  No edema.   LABORATORY DATA:  White blood cell count 8.9, hemoglobin 12.8, platelet  count 298,000.   ASSESSMENT:  This is a 55 year old man who is status post severe  pancreatitis secondary to gallstones and has had multiple complications  regarding.  He is also status post recent treatment of a left upper-quadrant  abscess which turned out to be pancreatic in nature.  He is being admitted  for presumed pancreatic abscess with symptoms of fever, abdominal pain,  chills and dehydration.   PLAN:  1. Admit.  2. IV antibiotics.  3. CT scan and drain placement as needed.  4. Follow up on cultures.  5. Infectious disease and surgical consultations again.  6. Phenergan p.r.n.  7. Control blood pressure as able.                                                Gwen Pounds, M.D.    JMR/MEDQ  D:  11/20/2003  T:  11/22/2003  Job:  [id number]   cc:   Abigail Miyamoto, M.D.  [address]  [place]  [place] [address]  Fax: [phone]

## 2011-01-22 NOTE — Procedures (Signed)
[place]. [facility]  Patient:    [name], [name]                      MRN: [id number] Proc. Date: 03/31/01 Adm. Date:  [id number] Attending:  Shelly Rubenstein CC:         Llana Aliment. Deterding, M.D.   Procedure Report  PROCEDURE:  Ash split catheter per dialysis catheter insertion.  INDICATION:  Access for hemodialysis.  INFORMED CONSENT:  The procedure was explained to the patient, both benefits and risks, understood and accepted.  DESCRIPTION OF PROCEDURE:  The patient was kept in his room and placed on the fluoroscopy table.  Initially, the right internal jugular vein was located in the middle one-third of the sternocleidomastoid triangle with the site right ultrasound device to make sure of its patency, position, and depth.  Subsequently, the right side of the neck and right upper chest were prepped with Betadine.  Xylocaine 2% local anesthesia was used in the area in the sternocleidomastoid triangle and in the inferolateral fashion approximately 8 by 4 cm.  The internal jugular vein was cannulated with an 18 gauge thinned-wall needle with site right ultrasound device to make sure of its entry.  Subsequently, the J-tipped guide wire was advanced through the 18 gauge thinned-wall needle into the internal jugular vein into the superior vena cava and right atrium under fluoroscopic guidance.  The 18 gauge thinned-wall needle and the open part of the guide wire went through the skin.  It was dilated with sharp dissection approximately 1 cm. Serial dilators were placed over the guide wire starting with 10 [place] and then going to 22 and 13 [place].  Subsequently, a 0.5 cm stab wound was made 2 cm inferior to the clavicle and 7 cm inferolateral to the vein entry.  Through the distal stab wound, a tunneling device was placed into the subcutaneous tissue and advanced in the subcutaneous tissue up to and through the opening of the skin through the vein  entry site.  A 24 cm Ash split catheter was placed on the distal tip of the tunnelling device and pulled through the tunnel with the Dacron cuffed pulled in at approximately 1 cm to the tunnel.  The tunnelling device was removed.  A 14 French trocar with tear-away sheath was placed over the guide wire and advanced into the internal jugular vein and the superior vena cava under fluoroscopic guidance.  A nonserrated dialysis clamp was placed around the trocar and seated just outside the skin.  The trocar was then withdrawn from the sheath.  The sheath was clamped with the dialysis clamped, both lumens of the double lumen Ash split catheter, and through the distal end of the sheath with the venous lumen being lateral.  The clamp was then clamped and the catheters were advanced through the remainder of the sheath in to the internal jugular vein and the superior vena cava with the tips coming to rest at the superior vena cava or the atrial junction and the sheath was removed.  Position was confirmed and there were no kinks in the catheters.  The catheter was then flushed with saline, concentrated heparin, and heparin-lock.  The skin over the vein entry was closed with 3-0 silk and the wings of the catheter also secured with 3-0 silk.  Hypafix dressing was applied. DD:  03/31/01 TD:  04/02/01 Job: [id number]

## 2011-01-22 NOTE — Op Note (Signed)
[place]. [facility]  Patient:    [name], [name]                      MRN: [id number] Proc. Date: 04/10/01 Adm. Date:  [id number] Attending:  Abigail Miyamoto A                           Operative Report  PREOPERATIVE DIAGNOSIS:  Severe pancreatitis with respiratory failure.  POSTOPERATIVE DIAGNOSIS:  Severe pancreatitis with respiratory failure.  OPERATION PERFORMED:  Tracheostomy.  SURGEON:  Abigail Miyamoto, M.D.  ASSISTANT:  Zigmund Daniel, M.D.  ANESTHESIA:  General endotracheal anesthesia.  DESCRIPTION OF PROCEDURE:  Patient brought to operating room and identified as [name].  He was placed supine on the operating table and general anesthesia was induced.  A transverse incision was made over the lower neck anteriorly with a 15 blade scalpel.  The incision was carried down through the platysma with the electrocautery.  The midline was then identified and opened. The muscles were then retracted laterally.  Dissection was then carried down through the soft tissue to the trachea which was easily identified.  Next, two 3-0 Prolene stay sutures were placed on each side of the trachea.  A transverse incision was then made in the trachea.  The tracheal hook was then used to elevate the trachea.  During opening the trachea, the Prolene sutures were cut.  The endotracheal tube was then backed out of the airway and the tracheal dilator was used to help insert the 8 [place] Shiley trach.  The inner cannula was then replaced.  Several suction passes were made down the trachea to suction out patients airway.  The ventilator was then attached to the trachea and good insufflation was demonstrated.  The balloon on the trach was inflated.  Next, the tracheostomy was sewn in place with four separate 3-0 nylon sutures.  Tape was then placed around the trachea  securing it to the neck.  Gauze was then packed to the wound as well.  The patient tolerated  the procedure well.  The patient was then taken in guarded condition from the operating room back to the intensive care unit. DD:  04/10/01 TD:  04/10/01 Job: [id number]

## 2011-01-22 NOTE — Discharge Summary (Signed)
NAMEJAYDRIEN, WASSENAAR                         ACCOUNT NO.:  [id number]   MEDICAL RECORD NO.:  [id number]                   PATIENT TYPE:  INP   LOCATION:  5704                                 FACILITY:  [facility]   PHYSICIAN:  Gwen Pounds, M.D.                 DATE OF BIRTH:  [birth year]   DATE OF ADMISSION:  10/02/2003  DATE OF DISCHARGE:  10/11/2003                                 DISCHARGE SUMMARY   GASTROENTEROLOGIST:  Petra Kuba, M.D.   SURGEON:  Abigail Miyamoto, M.D.   INFECTIOUS DISEASE:  Rockey Situ. Roxan Hockey, M.D.   HISTORY OF PRESENT ILLNESS:  This is a 55 year old African-American male  with a history of gallstone pancreatitis in July 2002 with ARDS sepsis  severe illness. He recovered nicely, but slowly. He ended up having a  cholecystectomy as well as pseudocyst drainages. He has done well since  establishing with me on June 25, 2001. He recently had a biopsy of a  retroperitoneal lymph node with confirmation of sarcoidosis. Dr. Elsie Lincoln felt  it was inactive and required not treatment at this time. He called in with  URI symptoms on September 23, 2003. He took a five day course of a Z-Pak from  January 17 to January 21. He called and was seen on September 30, 2003 with  fever, chills, and rigors. The congestion, sore throat, cough, and sputum  had improved, but by then he had left-sided chest, abdominal, and left upper  quadrant pain. Physical exam revealed a tender left upper quadrant and  fullness. We sent him for a CT scan, which revealed questionable abscess and  fluid filled pocket 6x5 cm in the proximal descending colon. He came in on  October 02, 2003 for interventional radiology. A drain was placed and the  pus was removed. The drain was kept and he was admitted for IV antibiotics  and drain management. He also needed some pain control about the surgical  site and fever management.   DISCHARGE DIAGNOSES:  1. Left upper quadrant abscess.  2. Left upper quadrant  drainage catheter in place.  3. 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  4. History of ARDS, tracheotomy, acute renal failure requiring dialysis,     cholecystectomy, and pseudocyst formation, all resolved in relationship     to the gallstone pancreatitis.  5. Hypertension.  6. Hyperlipidemia.  7. Status post ventral hernia repair.  8. Hemorrhoidectomy.  9. Sarcoidosis.   DISCHARGE MEDICATIONS:  1. Altace 10 mg b.i.d.  2. Cardizem CD 180 daily.  3. Hydrochlorothiazide 25 daily.  4. Augmentin 875 b.i.d. for 20 more days, through October 31, 2003.  5. Colace one in the a.m. one at night to help move bowels.  6. Pain management ibuprofen as needed.   DISCHARGE ACTIVITIES:  Stay out of work until the drain is removed.   DISCHARGE DIET:  Heart healthy.   WOUND CARE:  Flush drain three times daily with 10 cc of normal saline.  Record drainage amounts.   DISCHARGE INSTRUCTIONS:  He is to call if his temperature is greater than  101 or worsening pain.   DISCHARGE FOLLOW UP:  He is to follow up in two weeks with myself and repeat  liver tests. He is to follow up with Dr. Magnus Ivan and he is to call [phone]  for further instructions. He is also to go to admitting on Monday morning at  10 a.m. on October 13, 2002 to check in for x-ray. Nothing by mouth Sunday  after midnight for repeat CT scan. Follow-up on the lab tests.   HOSPITAL COURSE:  Mr. Seven Dollens was admitted with a left upper quadrant  abscess which was believed to be either pancreatic or diverticular in nature  or related to a prior pancreatic pseudocyst. He was admitted after a  drainage catheter was placed under CT guidance by interventional  radiologist, Dr. Bonnielee Haff. He was admitted for further evaluation and treatment  including IV antibiotics. During his hospital course Dr. Ewing Schlein Dr. Chevis Pretty, Dr. Magnus Ivan, Dr. Roxan Hockey were all consulted to help organize his  care. Initially he was put in on Rocephin and  Flagyl and discussions were  held about further studies including MRCP, EGD, and colonoscopy. On the  antibiotics and Tylenol he defervesced with continued drainage of the  abscess. He improved nicely, but slowly over time. The only complication was  that he had elevated LFTs/transaminitis, which could not be explained, but  as he improved they improved. He had some repeat CT scans and on one of the  CT scans they injected dye to see if there was any communication between the  pancreas and the colon. There was not. It was therefore presumed that this  was an infected pancreatic pseudocyst. The cultures grew out proteus  mirabilis and because it was relatively sensitive the antibiotics were  changed per infectious disease. He was eventually discharged on October 11, 2003 in stable condition with the drain still in place on oral antibiotics  with the idea that he would complete a month of antibiotic use and follow-up  with myself and Dr. Magnus Ivan as an outpatient to find out if any further  studies or tests need to be done including colonoscopy, exploratory  laparotomy, or others.   As he had elevated LFTs during the hospital stay, they were normal one to  two days prior, we will follow this closely with a follow-up CT Scan  checking out the liver and following laboratory data as an outpatient.   His high blood pressure remained stable throughout his hospital stay.   CONDITION ON DISCHARGE:  Stable.                                                Gwen Pounds, M.D.    JMR/MEDQ  D:  12/18/2003  T:  12/18/2003  Job:  [id number]

## 2011-01-22 NOTE — Consult Note (Signed)
[place]  Patient:    [name], [name]                        MRN: [id number] Proc. Date: 03/19/01 Adm. Date:  03/19/01 Attending:  Petra Kuba, M.D. CC:         Abigail Miyamoto, M.D.   Consultation Report  HISTORY OF PRESENT ILLNESS:  Patient with the last two weeks of increasing abdominal pain came on acutely much worse than usual last night and presented to the emergency room where an ultrasound was done that showed gallstones but also probable CBD stones and I was consulted by Dr. Magnus Ivan for consideration of preop ERCP.  He did have a similar bout last year where an ultrasound and CT scan were done.  Gallstones were found but no other abnormality but surgical options were not discussed at that time.  PAST MEDICAL HISTORY:  Tonsillectomy and some high blood pressure.  ALLERGIES:  CODEINE.  CURRENT MEDICATIONS:  Altace.  FAMILY HISTORY:  Dad with gallstones but no other GI problems in the family.  REVIEW OF SYSTEMS:  Between September and up to two weeks ago he was fine.  PHYSICAL EXAMINATION:  GENERAL:  Obvious pain.  VITAL SIGNS:  Stable, afebrile.  HEENT:  Sclerae are nonicteric.  NECK:  Supple, without obvious adenopathy.  LUNGS:  Clear.  HEART:  Regular rate and rhythm.  ABDOMEN:  Diffusely tender, it seems to be actually more on the left than on the right.  LABORATORIES:  Pertinent for normal amylase, lipase, liver tests, and CBC. Ultrasound reviewed with Dr. Deanne Coffer.  He is suspicious there are CBD stones, the duct is dilated compared to last year.  ASSESSMENT: 1. Gallstones and questionable CBD stones. 2. Diffuse abdominal pain with negative workup a year ago except for    gallstones.  PLAN:  Risks, benefits, methods, and options of the ERCP versus laparoscopic cholecystectomy with intraop cholangiogram versus open surgery duct _______ were discussed with the patient and the family and after a  prolonged conversation with Dr. Magnus Ivan he prefers me to proceed with the ERCP which we will do today since he is in some distress with further workup and findings pending its findings.  We discussed possibly proceeding with other x-rays like PIPIDA scan, CT, and even MRCP but based on his discomfort feel we should proceed sooner since the pain medicines are not getting him to relax. DD:  03/19/01 TD:  03/19/01 Job: [id number]

## 2011-01-22 NOTE — Consult Note (Signed)
[name]                         ACCOUNT NO.:  [id number]   MEDICAL RECORD NO.:  [id number]                   PATIENT TYPE:  INP   LOCATION:  5014                                 FACILITY:  [facility]   PHYSICIAN:  Abigail Miyamoto, M.D.              DATE OF BIRTH:  1956/03/23   DATE OF CONSULTATION:  11/21/2003  DATE OF DISCHARGE:                                   CONSULTATION   CHIEF COMPLAINT:  Abdominal pain and fevers.   REFERRING PHYSICIAN:  Gwen [name], M.D.   HISTORY:  [name] is a 55 year old gentleman well known to me  after a significant past medical history.  Most recently, he had been  admitted in January of this year for findings of a pancreatic pseudocyst  versus abscess of right upper quadrant.  Percutaneous drain was performed of  this, and after a long course of antibiotics and drainage, he improved.  In  early February, a study was done through the percutaneous drain, and the  cavity appeared to be consistent with an abscess with no communication with  the pancreas.  The drain was finally removed, and he has finished his course  of antibiotics.  He was doing well until this weekend when he developed  return of fevers and pain in the abdomen.  He presented to Dr. Ferd [name]  office on March 16 with fevers, chills, diaphoresis, and left upper quadrant  pain.  Given these findings, he was admitted to the hospital.   Currently today, he feels better, but he is still having some pain in the  right upper quadrant.  He is moving his bowels well.  He has had no cough,  shortness of breath, or chest pain.  He is moving his urine well.   PAST MEDICAL AND SURGICAL HISTORY:  Again, this is significant for having  had severe pancreatitis several years ago necessitating a long hospital  course in the intensive care unit for which he needed tracheostomy and was  on dialysis.  He had multiple percutaneous drains at that time.  Finally,  underwent a  laparoscopic cholecystectomy when he improved from this for  removal of his gallbladder. He since has also had a bout of sarcoidosis.  He  has some hypertension as well.   MEDICATIONS:  Include Altace and Cardizem.   ALLERGIES:  No known drug allergies.   SOCIAL HISTORY:  He does not smoke, does not drink alcohol.  He lives with  his wife.   REVIEW OF SYSTEMS:  Otherwise unremarkable.   PHYSICAL EXAMINATION:  GENERAL:  Obese, large, black gentleman in no acute  distress.  Generally he is well in appearance.  VITAL SIGNS:  Temperature 101.3, blood pressure 124/61, pulse 93,  respiratory rate 20.  LUNGS:  Clear to auscultation bilaterally with normal respiratory effort.  CARDIOVASCULAR:  Regular rate and rhythm with no murmurs.  ABDOMEN:  Soft.  There is tenderness  at his umbilicus in his right upper  quadrant.  He has a sensitive abdomen with some guarding which is usual for  him.  There are no masses.  EXTREMITIES:  Warm and well perfused.  NEUROLOGIC:  Nonfocal.  HEENT:  He is anicteric.  Pupils reactive bilaterally.  Ear, nose, mouth,  and throat are clear.  NECK:  Supple.   LABORATORY AND X-RAY DATA:  The patient has a white blood count normal at  10.3.  Liver function tests are normal as well, and his creatinine is 1.5.   IMPRESSION AND PLAN:  This is a patient with a possible recurrence of his  abscess in the left upper quadrant.  At this point, he is going to be  undergoing a CT scan of his abdomen and possible replacement of percutaneous  drain. At this point, I am not sure if this is a residual abscess causing  intercurrent infection or return of pancreatic pseudocyst.  We will base our  further decisions on the findings on Gram stain and whether amylase is  present in the fluid or not.  He may need GI and infectious disease  consultations.  I will follow him closely with you.                                               Abigail Miyamoto, M.D.    DB/MEDQ  D:   11/21/2003  T:  11/23/2003  Job:  [id number]   cc:   [name], M.D.  [address]  [place]  [place] [address]  Fax: [phone]

## 2011-01-22 NOTE — H&P (Signed)
NAMEIBN, STIEF NO.:  [id number]   MEDICAL RECORD NO.:  [id number]                   PATIENT TYPE:  OBV   LOCATION:  5704                                 FACILITY:  [facility]   PHYSICIAN:  Gwen Pounds, M.D.                 DATE OF BIRTH:  02-Jan-1956   DATE OF ADMISSION:  10/02/2003  DATE OF DISCHARGE:                                HISTORY & PHYSICAL   PHYSICIANS:  1. Gwen Pounds, M.D., primary care physician.  2. Petra Kuba, M.D., gastroenterologist.  3. Abigail Miyamoto, M.D., surgery.   CHIEF COMPLAINT:  Abdominal abscess.   HISTORY OF PRESENT ILLNESS:  This 55 year old African American male with  history of severe gallstone pancreatitis in July of 2002, complicated by  ARDS, sepsis, severe illness, near death, acute renal failure requiring  hemodialysis, and pseudocyst requiring multiple drainage.  He recovered  nicely and at some point during the hospitalization had a cholecystectomy.  He has done well since establishing with me on July 05, 2001.  He  recently had a biopsy for retroperitoneal lymph node with confirmation of  sarcoidosis.  He was referred on to Dr. Maple Hudson who felt it was inactive and  required no current treatment.   He called in with URI type symptoms and on September 22, 2002 he took a five-  day course of a Z-Pak which ended on September 27, 2003.  He called and again  was seen on September 30, 2003 with fevers, chills, and rigors.  On  discussion, the congestion, sore throat, cough, and sputum had improved but  he also had a new left-sided chest and abdominal pain in his left upper  quadrant.  Physical exam revealed a very tender full left upper quadrant.  We sent him for a CT scan which revealed a questionable abscess/fluid-filled  pocket 6 by 5 cm at the descending colon.   Labs were drawn and they were all within normal limits.  He did not have a  white count or increased LFTs.  He came in to day for  interventional  radiology procedure.  A drain was placed and pus was present.  The drain was  kept and he was admitted for IV antibiotics and drain management.  He has  some pain from the surgical site and continued fever.  No other complaints  except for some nausea and vomiting after getting some narcotics.   PAST MEDICAL HISTORY:  1. Gallstone pancreatitis with complications of adult respiratory distress     syndrome, tracheotomy, acute renal failure requiring hemodialysis,     cholecystectomy, and pseudocyst requiring drains. These are subsequently     resolved.  2. Hypertension.  3. Hyperlipidemia.  4. Status post ventral hernia repair.  5. Hemorrhoidectomy.  6. Sarcoidosis.   SOCIAL HISTORY:  He is married.  Two daughters. He is a Naval architect for  [employer].  No tobacco and  no alcohol.   FAMILY HISTORY:  Hypertension and stroke.   ALLERGIES:  CODEINE and VICODIN.   MEDICATIONS:  1. Altace 10 mg b.i.d.  2. Cardizem CD 180 mg q.d.  3. Hydrochlorothiazide 25 mg q.d. on hold.   REVIEW OF SYMPTOMS:  No chest pain, shortness of breath, no bright red blood  per rectum, no diarrhea but there has been some nausea and vomiting. There  has been pain in the left side, now diffusely over the belly.  No other  symptoms noted and all other organ systems reviewed and negative except for  fevers, chills, and rigors.   PHYSICAL EXAMINATION:  VITAL SIGNS:  Temperature 102.8, heart rate 105,  respiratory rate is 16, blood pressure 119/65.  GENERAL:  Alert and oriented x 3.  Appears comfortable.  LUNGS:  Clear to auscultation bilaterally.  CARDIOVASCULAR:  Regular.  ABDOMEN:  Tender.  Left abdominal drain shows the bag has pus noted brown  discharge.  HEENT:  PERRL.  EOMI.  Oropharynx is moist.  Old tracheostomy scar noted and  healed.  EXTREMITIES:  No edema.   LABORATORY DATA:  Liver tests were normal on September 30, 2003.  Today, he  has a lipase of 28, amylase 88, total bilirubin 1.4,  white blood cell count  is between 9.2 and 11.6, hemoglobin of 13.3, platelet count 390,000, AST  395, ALT 252.  Sodium of 132, potassium 4.0, chloride 98, bicarbonate 24,  BUN 12, creatinine 1.3, glucose 109, alkaline phosphate 198, blood cultures  are negative thus far.  Fluid with multiple polys and INR of 1.1.   ASSESSMENT:  This is a 55 year old man who is status post severe  pancreatitis secondary to gallstones and had multiple complications  regarding.  He has subsequently been in stable and healthy condition.  He  presents with left upper quadrant abscess, status post drain and getting  intravenous antibiotics.   PLAN:  1. Admit.  2. Intravenous antibiotics.  3. Continue the drain.  4. We will get surgery to see.  5. Appreciate Dr. Lavada Mesi. Magod's input.  6. He apparently has not had colonoscopy as I thought.  I originally told     Dr. Petra Kuba in July of 2004 and this was not the case.  7. Increased LFTs, probably secondary to ____________ symptoms.  We will     follow accordingly.  8. Control blood pressure.  9. Control pain.  10.      Ibuprofen for increased temperature and try to limit Tylenol as     needed to minimal.  11.      Repeat CT scan in one to two days.  12.      Differential diagnoses include recurrent pseudocyst abscess versus     diverticular abscess versus other.                                                Gwen Pounds, M.D.    JMR/MEDQ  D:  10/02/2003  T:  10/03/2003  Job:  [id number]   cc:   Gwen Pounds, M.D.  [address]  [place]  [place] [address]  Fax: [phone]   Petra Kuba, M.D.  [address]  [place]  [place] [address]  Fax: [phone]   Abigail Miyamoto, M.D.  [address]  [place]  [place]  [address]  Fax: [phone]

## 2011-01-22 NOTE — Op Note (Signed)
NAMERAMAN, FEATHERSTON                         ACCOUNT NO.:  [id number]   MEDICAL RECORD NO.:  [id number]                   PATIENT TYPE:  INP   LOCATION:  0476                                 FACILITY:  [facility]   PHYSICIAN:  Abigail Miyamoto, M.D.              DATE OF BIRTH:  June 11, 1956   DATE OF PROCEDURE:  12/20/2003  DATE OF DISCHARGE:                                 OPERATIVE REPORT   PREOPERATIVE DIAGNOSIS:  Retroperitoneal recurrent abscess.   POSTOPERATIVE DIAGNOSIS:  Retroperitoneal recurrent abscess.   PROCEDURE:  1. Exploratory laparotomy.  2. Lysis of adhesions.  3. Drainage of retroperitoneal abscess.   SURGEON:  Abigail Miyamoto, M.D.   ANESTHESIA:  General endotracheal anesthesia.   ESTIMATED BLOOD LOSS:  Minimal.   INDICATIONS:  [name] is a very pleasant 55 year old gentleman who  several years ago had severe pancreatitis which was life-threatening,  requiring intensive ICU course.  He had multiple fluid collections at that  time, which all resolved.  He had a flare-up of sarcoid approximately a year  ago, and biopsies confirmed this.  He had been well until January of this  year when he developed an abscess in his left retroperitoneum.  This was  percutaneously drained and did not appear to communicate with any organ;  however, this recurred several weeks later, and the drain was replaced  again.  MRCP showed no communication with the pancreas, and colonoscopy was  unremarkable.  Given these findings, the decision was made to explore this  area.   FINDINGS:  This patient was found to have an abscess in the left  retroperitoneum that was posterolateral to the descending colon.  The  pancreas itself was normal, and the colon appeared normal, other than being  fixated on top of the abscess.  No communication with the colon, and no  evidence of diverticulitis was identified.   PROCEDURE IN DETAIL:  Patient was brought to the operating room and  identified as [name].  He was placed supine on the operating room  table, and general anesthesia was induced.  His abdomen was then prepped and  draped in the usual sterile fashion.  Using a #10 blade, a midline incision  was then created in the upper abdomen.  This was taken down to the  umbilicus.  The incision was carried down through the fascia with  electrocautery.  The peritoneum was opened to the entire length of the  incision.  The fascia was opened likewise to incorporate a small umbilical  hernia that was present.  Upon entering the abdomen, multiple adhesions were  identified and were taken down with the Metzenbaum scissors.  The transverse  colon was normal, as was the stomach.  Several adhesions of the omentum to  the liver were taken down likewise.  The pancreas was palpated through the  lesser sac and was found to be normal in texture.  The descending colon was  then examined, and the actual area of abscess was found in the lower  descending colon.  A thick, firm area of tissue was identified there.  The  right colon was mobilized over this area of thickness with the  electrocautery, and the abscessed cavity was opened.  The previously placed  drain was identified and removed.  The abscessed cavity was examined and  thoroughly cleaned out with several liters of normal saline.  Some of the  necrotic debris was fully dissected free from as well.  The colon over the  top of the abscess was examined, and no communication was identified.  No  evidence of diverticulitis or diverticula were identified in this area.  The  abscess appeared to be quite distant from the pancreas.  At this point, a  separate skin incision was made, and a 50 [place] was placed through  this and sutured in place.  The Blake drain was placed into the area of the  dissection of the retroperitoneum.  At this point, the abdomen was  thoroughly irrigated again with normal saline.  At this point, no  further  resection or exploration was necessary.  The midline fascia was then closed  with a running #1 PDS suture as well as interrupted #1 Novofil pop-off  sutures.  The skin was then irrigated and closed with skin staples. Patient  tolerated the procedure well.  All counts were correct at the end of the  procedure.  The patient was then extubated in the operating room and taken  in stable condition to the recovery room.                                               Abigail Miyamoto, M.D.    DB/MEDQ  D:  12/20/2003  T:  12/20/2003  Job:  [id number]   cc:   Gwen Pounds, M.D.  [address]  [place]  [place] [address]  Fax: [phone]   Petra Kuba, M.D.  [address]  [place]  [place] [address]  Fax: [phone]

## 2011-01-22 NOTE — Discharge Summary (Signed)
[name], [name]                         ACCOUNT NO.:  [id number]   MEDICAL RECORD NO.:  [id number]                   PATIENT TYPE:  INP   LOCATION:  0467                                 FACILITY:  [facility]   PHYSICIAN:  Abigail Miyamoto, M.D.              DATE OF BIRTH:  [birth year]   DATE OF ADMISSION:  12/20/2003  DATE OF DISCHARGE:  12/25/2003                                 DISCHARGE SUMMARY   DISCHARGE DIAGNOSES:  1. Chronic retroperitoneal abscess.  2. Hypertension.  3. Patient is status post exploratory laparotomy, lysis of adhesions, and     drainage of chronic retroperitoneal abscess.   SUMMARY OF HISTORY:  Mr. Jaleil Renwick is a 55 year old gentleman who had  extensive past history of gallstone pancreatitis requiring a prolonged  hospital admission several years ago with tracheostomy, need for dialysis,  etc. He also had multiple drains at that time for abscesses.  He recovered  and has done well until earlier this year when he presented to his primary  care physician, Dr. Creola Corn, with fever, chills, and abdominal pain. He  was found on CAT scan at that time to have an intra-abdominal abscess which  was percutaneously drained. At that time it did not appear to communicate  with the pancreas and the bacteria that grew out responded appropriately to  antibiotics. Before removing the drain it was studied and no communication  was identified other than the abscess cavity. Several weeks later he  presented with recurrence of the abscess which was again percutaneously  drained.  He had an MRCP at that time that showed no communication with the  pancreas again. He also had a colonoscopy by Dr. Vida Rigger that showed no  abnormalities of the colon. Given the chronic nature of this, decision was  made to keep him on antibiotics and bring him back to the hospital for  planned exploratory laparotomy.   HOSPITAL COURSE:  The patient was admitted and taken to the operating  room  where he underwent an exploratory laparotomy with lysis of adhesions and  drainage of the retroperitoneal abscess. The abscess was extended to the  retroperitoneum of the descending colon with no communication to the right  colon and was far from the area of the pancreas which was grossly normal. A  drain was left in place, which he tolerated well, and was taken in stable  condition to the regular surgical floor.  Over the next several days he did  develop a slight ileus and fever. He was kept on antibiotics for the fever  and drainage from the catheter. Cultures from this were negative.  On  postoperative day two he continued to have some slight fever, but felt well  otherwise. His abdomen was distended. He had a mild postoperative ileus.  Over the next several days he slowly improved and on postoperative day four  he was passing flatus and his distention was  reducing more. His temperatures  were coming down as well. Dr. Timothy Lasso continued to follow Mr Prajapati from a  general medical standpoint and Dr. Ewing Schlein made a social visit to check on him  as well. Mr. Kimmons continued to do well and on postoperative day six was  feeling well. His drain was draining only 5 cc, so it was removed and give  his improvement decision was made to discharge him to home with oral  antibiotics.   DISCHARGE MEDICATIONS:  He will take Demerol and Advil for pain. He will  take Augmentin 875 mg b.i.d. for 10 days. He will resume his home  medications. He is to do no heavy lifting greater than 20 pounds for five  weeks. He is going to follow up with my office in one week for staple  removal. He will also follow up with Dr. Timothy Lasso in three to four weeks for  recheck.                                               Abigail Miyamoto, M.D.    DB/MEDQ  D:  01/06/2004  T:  01/06/2004  Job:  [id number]   cc:   Petra Kuba, M.D.  [address]  [place]  [place] [address]  Fax: [phone]   Gwen Pounds,  M.D.  [address]  [place]  [place] [address]  Fax: [phone]

## 2011-02-01 ENCOUNTER — Inpatient Hospital Stay (INDEPENDENT_AMBULATORY_CARE_PROVIDER_SITE_OTHER)
Admission: RE | Admit: 2011-02-01 | Discharge: 2011-02-01 | Disposition: A | Discharge status: Home / Self Care | Payer: BC Managed Care – PPO | Source: Ambulatory Visit | Attending: Emergency Medicine | Admitting: Emergency Medicine

## 2011-02-01 ENCOUNTER — Ambulatory Visit (INDEPENDENT_AMBULATORY_CARE_PROVIDER_SITE_OTHER): Payer: BC Managed Care – PPO

## 2011-02-01 DIAGNOSIS — J40 Bronchitis, not specified as acute or chronic: Secondary | ICD-10-CM

## 2011-05-28 LAB — URINALYSIS, ROUTINE W REFLEX MICROSCOPIC
Bilirubin Urine: NEGATIVE
Glucose, UA: NEGATIVE
Hgb urine dipstick: NEGATIVE
Ketones, ur: NEGATIVE
Leukocytes, UA: NEGATIVE
Nitrite: NEGATIVE
Protein, ur: 100 — AB
Specific Gravity, Urine: 1.026
Urobilinogen, UA: 0.2
pH: 6

## 2011-05-28 LAB — DIFFERENTIAL
Basophils Absolute: 0
Basophils Relative: 0
Eosinophils Absolute: 0.4
Eosinophils Relative: 6 — ABNORMAL HIGH
Lymphocytes Relative: 13
Lymphs Abs: 0.9
Monocytes Absolute: 0.8
Monocytes Relative: 13 — ABNORMAL HIGH
Neutro Abs: 4.6
Neutrophils Relative %: 68

## 2011-05-28 LAB — HEPATIC FUNCTION PANEL
ALT: 44
AST: 53 — ABNORMAL HIGH
Albumin: 3.6
Alkaline Phosphatase: 74
Bilirubin, Direct: 0.4 — ABNORMAL HIGH
Indirect Bilirubin: 0.5
Total Bilirubin: 0.9
Total Protein: 7.6

## 2011-05-28 LAB — POCT CARDIAC MARKERS
CKMB, poc: <1 — ABNORMAL LOW
Myoglobin, poc: 68
Operator id: 151321
Troponin i, poc: <0.05

## 2011-05-28 LAB — CBC
HCT: 41.4
Hemoglobin: 14.4
MCHC: 34.7
MCV: 83.5
Platelets: 291
RBC: 4.96
RDW: 15.4
WBC: 6.8

## 2011-05-28 LAB — I-STAT 8, (EC8 V) (CONVERTED LAB)
Acid-Base Excess: 4 — ABNORMAL HIGH
BUN: 14
Bicarbonate: 28.6 — ABNORMAL HIGH
Chloride: 107
Glucose, Bld: 111 — ABNORMAL HIGH
HCT: 45
Hemoglobin: 15.3
Operator id: 151321
Potassium: 3.8
Sodium: 140
TCO2: 30
pCO2, Ven: 44 — ABNORMAL LOW
pH, Ven: 7.421 — ABNORMAL HIGH

## 2011-05-28 LAB — URINE MICROSCOPIC-ADD ON

## 2011-05-28 LAB — LIPASE, BLOOD: Lipase: 35

## 2011-05-28 LAB — POCT I-STAT CREATININE
Creatinine, Ser: 1.5
Operator id: 151321

## 2011-06-24 LAB — COMPREHENSIVE METABOLIC PANEL
ALT: 30
AST: 31
Albumin: 3.5
Alkaline Phosphatase: 175 — ABNORMAL HIGH
BUN: 17
CO2: 30
Calcium: 12.6 — ABNORMAL HIGH
Chloride: 103
Creatinine, Ser: 1.84 — ABNORMAL HIGH
GFR calc Af Amer: 47 — ABNORMAL LOW
GFR calc non Af Amer: 39 — ABNORMAL LOW
Glucose, Bld: 86
Potassium: 4.4
Sodium: 137
Total Bilirubin: 0.5
Total Protein: 8.3

## 2011-06-24 LAB — URINALYSIS, ROUTINE W REFLEX MICROSCOPIC
Bilirubin Urine: NEGATIVE
Glucose, UA: NEGATIVE
Ketones, ur: NEGATIVE
Nitrite: NEGATIVE
Protein, ur: NEGATIVE
Specific Gravity, Urine: 1.014
Urobilinogen, UA: 0.2
pH: 6.5

## 2011-06-24 LAB — ANGIOTENSIN CONVERTING ENZYME: Angiotensin-Converting Enzyme: 16 U/L (ref 9–67)

## 2011-06-24 LAB — URINALYSIS, MICROSCOPIC ONLY
Bilirubin Urine: NEGATIVE
Glucose, UA: 100 — AB
Hgb urine dipstick: NEGATIVE
Ketones, ur: NEGATIVE
Leukocytes, UA: NEGATIVE
Nitrite: NEGATIVE
Protein, ur: NEGATIVE
Specific Gravity, Urine: 1.011 (ref 1.005–1.035)
Urobilinogen, UA: 0.2
WBC, UA: NONE SEEN
pH: 6.5

## 2011-06-24 LAB — RENAL FUNCTION PANEL
Albumin: 3.2 — ABNORMAL LOW
Albumin: 3.6
BUN: 17
BUN: 19
CO2: 28
CO2: 29
Calcium: 11.2 — ABNORMAL HIGH
Calcium: 12 — ABNORMAL HIGH
Chloride: 104
Chloride: 99
Creatinine, Ser: 1.49
Creatinine, Ser: 1.83 — ABNORMAL HIGH
GFR calc Af Amer: 48 — ABNORMAL LOW
GFR calc Af Amer: >60
GFR calc non Af Amer: 39 — ABNORMAL LOW
GFR calc non Af Amer: 50 — ABNORMAL LOW
Glucose, Bld: 101 — ABNORMAL HIGH
Glucose, Bld: 128 — ABNORMAL HIGH
Phosphorus: 1.8 — ABNORMAL LOW
Phosphorus: 3.6
Potassium: 4.2
Potassium: 4.9
Sodium: 135
Sodium: 140

## 2011-06-24 LAB — PTH-RELATED PEPTIDE: PTH-related peptide: <0.2 (ref <1.4)

## 2011-06-24 LAB — CBC
HCT: 35.7 — ABNORMAL LOW
Hemoglobin: 11.9 — ABNORMAL LOW
MCHC: 33.3
MCV: 79.4
Platelets: 353
RBC: 4.49
RDW: 16.6 — ABNORMAL HIGH
WBC: 6.7

## 2011-06-24 LAB — URINE MICROSCOPIC-ADD ON

## 2011-06-24 LAB — PTH, INTACT AND CALCIUM
Calcium, Total (PTH): 11.9 — ABNORMAL HIGH
PTH: 3.6 — ABNORMAL LOW

## 2011-06-24 LAB — VITAMIN D 1,25 DIHYDROXY: Vit D, 1,25-Dihydroxy: 120 pg/mL — ABNORMAL HIGH (ref 6–62)

## 2011-06-24 LAB — FREE PSA
PSA, Free Pct: 48 (ref >25)
PSA, Free: 0.2

## 2011-06-24 LAB — CEA: CEA: <0.5

## 2011-06-24 LAB — CANCER ANTIGEN 19-9: CA 19-9: <1.2 — ABNORMAL LOW (ref <35.0)

## 2011-06-24 LAB — PSA: PSA: 0.42

## 2011-06-24 LAB — LACTATE DEHYDROGENASE: LDH: 282 — ABNORMAL HIGH

## 2011-06-24 LAB — SEDIMENTATION RATE: Sed Rate: 63 — ABNORMAL HIGH

## 2011-07-25 ENCOUNTER — Other Ambulatory Visit: Payer: Self-pay | Admitting: Cardiovascular Disease

## 2011-09-24 ENCOUNTER — Emergency Department (HOSPITAL_COMMUNITY): Payer: BC Managed Care – PPO

## 2011-09-24 ENCOUNTER — Encounter (HOSPITAL_COMMUNITY): Payer: Self-pay | Admitting: Emergency Medicine

## 2011-09-24 ENCOUNTER — Emergency Department (HOSPITAL_COMMUNITY)
Admission: EM | Admit: 2011-09-24 | Discharge: 2011-09-24 | Disposition: A | Discharge status: Home / Self Care | Payer: BC Managed Care – PPO | Attending: Emergency Medicine | Admitting: Emergency Medicine

## 2011-09-24 DIAGNOSIS — R109 Unspecified abdominal pain: Secondary | ICD-10-CM | POA: Insufficient documentation

## 2011-09-24 DIAGNOSIS — R197 Diarrhea, unspecified: Secondary | ICD-10-CM | POA: Insufficient documentation

## 2011-09-24 DIAGNOSIS — R112 Nausea with vomiting, unspecified: Secondary | ICD-10-CM

## 2011-09-24 DIAGNOSIS — R10812 Left upper quadrant abdominal tenderness: Secondary | ICD-10-CM | POA: Insufficient documentation

## 2011-09-24 DIAGNOSIS — Z9889 Other specified postprocedural states: Secondary | ICD-10-CM | POA: Insufficient documentation

## 2011-09-24 DIAGNOSIS — I10 Essential (primary) hypertension: Secondary | ICD-10-CM | POA: Insufficient documentation

## 2011-09-24 HISTORY — DX: Essential (primary) hypertension: I10

## 2011-09-24 HISTORY — DX: Acute pancreatitis without necrosis or infection, unspecified: K85.90

## 2011-09-24 HISTORY — DX: Sarcoidosis, unspecified: D86.9

## 2011-09-24 LAB — POCT I-STAT, CHEM 8
BUN: 13 mg/dL (ref 6–23)
Calcium, Ion: 1.18 mmol/L (ref 1.12–1.32)
Chloride: 105 mEq/L (ref 96–112)
Creatinine, Ser: 1.3 mg/dL (ref 0.50–1.35)
Glucose, Bld: 96 mg/dL (ref 70–99)
HCT: 48 % (ref 39.0–52.0)
Hemoglobin: 16.3 g/dL (ref 13.0–17.0)
Potassium: 4.2 mEq/L (ref 3.5–5.1)
Sodium: 144 mEq/L (ref 135–145)
TCO2: 27 mmol/L (ref 0–100)

## 2011-09-24 MED ORDER — MORPHINE SULFATE 4 MG/ML IJ SOLN
4.0000 mg | Freq: Once | INTRAMUSCULAR | Status: AC
  Administered 2011-09-24: 4 mg via INTRAVENOUS
  Filled 2011-09-24: qty 1

## 2011-09-24 MED ORDER — SODIUM CHLORIDE 0.9 % IV BOLUS (SEPSIS)
1000.0000 mL | Freq: Once | INTRAVENOUS | Status: AC
  Administered 2011-09-24: 1000 mL via INTRAVENOUS

## 2011-09-24 MED ORDER — PROMETHAZINE HCL 25 MG PO TABS: 25.0000 mg | ORAL_TABLET | Freq: Four times a day (QID) | ORAL | Status: DC | PRN

## 2011-09-24 MED ORDER — PROMETHAZINE HCL 25 MG/ML IJ SOLN
25.0000 mg | Freq: Four times a day (QID) | INTRAMUSCULAR | Status: DC | PRN
  Administered 2011-09-24: 25 mg via INTRAVENOUS
  Filled 2011-09-24 (×2): qty 1

## 2011-09-24 MED ORDER — ONDANSETRON HCL 4 MG/2ML IJ SOLN
4.0000 mg | Freq: Once | INTRAMUSCULAR | Status: AC
  Administered 2011-09-24: 4 mg via INTRAVENOUS
  Filled 2011-09-24: qty 2

## 2011-09-24 NOTE — ED Provider Notes (Signed)
Medical screening examination/treatment/procedure(s) were performed by non-physician practitioner and as supervising physician I was immediately available for consultation/collaboration.  Ethelda Chick, MD 09/24/11 2111

## 2011-09-24 NOTE — ED Provider Notes (Signed)
History     CSN: [id number]  Arrival date & time 09/24/11  1553   First MD Initiated Contact with Patient 09/24/11 1606      Chief Complaint  Patient presents with  . Abdominal Pain  . Emesis  . Diarrhea    (Consider location/radiation/quality/duration/timing/severity/associated sxs/prior treatment) HPI Comments: Patient reports he had a colonoscopy this morning with Dr Ewing Schlein, [facility], and since then has had persistent abdominal pain, N/V/D.  States he is unable to tolerate PO.  Pain is diffuse but worse in his upper abdomen.  Emesis x 4, all green in color.  Diarrhea is also green, once having approximately 1-3 teaspoons of blood in it.    Patient is a 56 y.o. male presenting with abdominal pain, vomiting, and diarrhea. The history is provided by the patient and the spouse.  Abdominal Pain The primary symptoms of the illness include abdominal pain, nausea, vomiting and diarrhea. The primary symptoms of the illness do not include fever.  Symptoms associated with the illness do not include constipation.  Emesis  Associated symptoms include abdominal pain and diarrhea. Pertinent negatives include no fever.  Diarrhea The primary symptoms include abdominal pain, nausea, vomiting and diarrhea. Primary symptoms do not include fever.  The illness does not include constipation.    Past Medical History  Diagnosis Date  . Pancreatitis     Past Surgical History  Procedure Date  . Cholecystectomy     No family history on file.  History  Substance Use Topics  . Smoking status: Not on file  . Smokeless tobacco: Not on file  . Alcohol Use:       Review of Systems  Constitutional: Negative for fever.  Cardiovascular: Negative for chest pain.  Gastrointestinal: Positive for nausea, vomiting, abdominal pain, diarrhea and blood in stool. Negative for constipation.  All other systems reviewed and are negative.    Allergies  Codeine and Hydromorphone  Home Medications    Current Outpatient Rx  Name Route Sig Dispense Refill  . DILTIAZEM HCL ER BEADS 360 MG PO CP24  TAKE 1 CAPSULE BY MOUTH ONCE DAILY 30 capsule 11  . DOXAZOSIN MESYLATE 2 MG PO TABS Oral Take 1 mg by mouth at bedtime.    . ENALAPRIL MALEATE 10 MG PO TABS Oral Take 5 mg by mouth daily.     Marland Kitchen OLMESARTAN MEDOXOMIL 20 MG PO TABS Oral Take 40 mg by mouth daily.     Marland Kitchen PREDNISONE 5 MG PO TABS Oral Take 5 mg by mouth daily.    Marland Kitchen ZOLPIDEM TARTRATE 10 MG PO TABS Oral Take 10 mg by mouth at bedtime.      BP 156/122  Pulse 72  Temp(Src) 98.8 F (37.1 C) (Oral)  Resp 18  SpO2 100%  Physical Exam  Constitutional: He is oriented to person, place, and time. He appears well-developed and well-nourished.  HENT:  Head: Normocephalic and atraumatic.  Neck: Neck supple.  Cardiovascular: Normal rate, regular rhythm and normal heart sounds.   Pulmonary/Chest: Breath sounds normal. No respiratory distress. He has no wheezes. He has no rales. He exhibits no tenderness.  Abdominal: Soft. Bowel sounds are normal. He exhibits no distension and no mass. There is generalized tenderness. There is no rebound and no guarding.       General tenderness of abdomen, worst in LUQ.   Neurological: He is alert and oriented to person, place, and time.    ED Course  Procedures (including critical care time)   Labs Reviewed  POCT I-STAT, CHEM 8  I-STAT, CHEM 8   Hgb 16.3  Dg Abd Acute W/chest  09/24/2011  *RADIOLOGY REPORT*  Clinical Data: Abdominal pain.  Nausea and vomiting.  Colonoscopy this morning.  ACUTE ABDOMEN SERIES (ABDOMEN 2 VIEW & CHEST 1 VIEW)  Comparison: 03/02/2010 CT scan  Findings: Subsegmental atelectasis noted at the right lung base.  Cardiac and mediastinal contours appear unremarkable.  Scattered gas noted in the colon, with the small bowel gasless.  No dilated bowel noted.  Posterolateral rod pedicle screw fixation noted at L4-5.  Left renal calculi include a 6 mm cluster of several adjacent  smaller calculi.  No free intraperitoneal gas is observed beneath the hemidiaphragms on the upright projection.  IMPRESSION:  1.  No findings of free intraperitoneal gas beneath the hemidiaphragms. 2.  Nonobstructive left renal calculi. 3.  Subsegmental atelectasis at the right lung base.  Original Report Authenticated By: Dellia Cloud, M.D.    4:28 PM Patient seen and examined.  Patient has hx of HTN but has taken his medications this morning.  Patient is in pain, this is likely the cause of his hypertension at this time.  No CP, SOB, AMS.  Will continue to follow.    5:45 PM Patient discussed with Dr Karma Ganja.    6:01 PM Patient reports pain is much improved, continues to have nausea.  Xray shows no free air.  Abdomen soft, nontender, no guarding, no rebound.  HTN much improved after pain control.  Filed Vitals:   09/24/11 1706  BP: 148/80  Pulse: 71  Temp: 99.3 F (37.4 C)  Resp:    7:19 PM I discussed patient with Dr Madilyn Fireman, [facility].  Dr Madilyn Fireman agrees with workup and plan for d/c home with phenergan and [facility] follow up.    7:39 PM Patient reports he is keeping down fluids, is feeling much better, would like to be discharged home to attempt management of his symptoms there.    1. Nausea vomiting and diarrhea   2. Abdominal pain       MDM  Patient with N/V/D abdominal pain following routine colonoscopy this morning.  GI sent patient to ED out of concern for perforation.  Xray is normal and patient's abdominal pain subsided quickly with one dose of pain medication - therefore doubt perforation.  Patient continued to improve in ED.  No further episodes of diarrhea, two further episodes of vomiting, resolved with phenergan.  No concern for major GI bleeding - pt does report 1-3 tsp blood in stool earlier today but no bleeding since, no blood or coffee grounds emesis, Hgb normal, pulse normal once pain controlled.  Patient to be d/c home with symptomatic treatment, GI follow up.         Dillard Cannon [place], [place] 09/24/11 1943

## 2011-09-24 NOTE — ED Notes (Signed)
Pt had a colonoscopy with [facility] this morning and states that he was given propofol and phenergan. He began with N/V/D after the procedure this morning. States that his emesis, stool, and urine were all green in color. Kidneys hurt. Had not eaten anything before the surgery and has thrown up what he has tried to eat since. Has had several abdominal surgeries (ie. Gall bladder, exploratory, pancreatitis). Pain 8/10.

## 2011-09-24 NOTE — ED Notes (Signed)
Pt vomitting at this time.

## 2012-02-23 ENCOUNTER — Other Ambulatory Visit: Payer: Self-pay | Admitting: Orthopaedic Surgery

## 2012-02-23 DIAGNOSIS — M549 Dorsalgia, unspecified: Secondary | ICD-10-CM

## 2012-02-26 ENCOUNTER — Ambulatory Visit
Admission: RE | Admit: 2012-02-26 | Discharge: 2012-02-26 | Disposition: A | Discharge status: Home / Self Care | Payer: BC Managed Care – PPO | Source: Ambulatory Visit | Attending: Orthopaedic Surgery | Admitting: Orthopaedic Surgery

## 2012-02-26 DIAGNOSIS — M549 Dorsalgia, unspecified: Secondary | ICD-10-CM

## 2012-10-18 ENCOUNTER — Ambulatory Visit (INDEPENDENT_AMBULATORY_CARE_PROVIDER_SITE_OTHER): Payer: BC Managed Care – PPO | Admitting: Physician Assistant

## 2012-10-18 VITALS — BP 160/93 | HR 82 | Temp 98.0°F | Resp 16 | Ht 71.0 in | Wt 250.6 lb

## 2012-10-18 DIAGNOSIS — H109 Unspecified conjunctivitis: Secondary | ICD-10-CM

## 2012-10-18 MED ORDER — POLYMYXIN B-TRIMETHOPRIM 10000-0.1 UNIT/ML-% OP SOLN: 1.0000 [drp] | OPHTHALMIC | Status: DC

## 2012-10-18 NOTE — Progress Notes (Signed)
  Subjective:    Patient ID: [name], male    DOB: 1956/05/22, 57 y.o.   MRN: [id number]  HPI   [name] is a pleasant 57 yr old male here because "I think I have pink eye."  Woke up this AM with an itchy, watery left eye.  Somewhat of a foreign body sensation.  The eye was crusted this morning.  He continues to have constant watery discharge from the left eye.  Right is unaffected.  No headache, visual changes, sensitivity to light.  No pain in the eye - just itching, burning.  Some runny nose but otherwise no other symptoms.  Wears reading glasses only, no contacts.  No history of eye problems.  Not aware of any pink eye contacts.  Spends time with grandkids, but they have been ok as far as he knows.    BP is elevated today.  Pt states his pressure is often up when he is at the doctor.  Is currently treated for HTN, had just taken his medicine prior to having his BP checked, thinks maybe the meds had not taken effect yet.  States he is asymptomatic.    Review of Systems  Constitutional: Negative for fever and chills.  HENT: Positive for rhinorrhea.   Eyes: Positive for discharge, redness and itching. Negative for photophobia, pain and visual disturbance.  Respiratory: Negative.   Cardiovascular: Negative.   Gastrointestinal: Negative.   Musculoskeletal: Negative.   Neurological: Negative.        Objective:   Physical Exam  Vitals reviewed. Constitutional: He is oriented to person, place, and time. He appears well-developed and well-nourished. No distress.  HENT:  Head: Normocephalic and atraumatic.  Nose: Rhinorrhea present.  Eyes: EOM are normal. Pupils are equal, round, and reactive to light. Right eye exhibits no discharge and no hordeolum. No foreign body present in the right eye. Left eye exhibits discharge (crusting, matting of lashes). Left eye exhibits no hordeolum. No foreign body present in the left eye. Right conjunctiva is not injected. Left conjunctiva is injected. No  scleral icterus.  On fluorescein exam, conjunctival abrasion on the lateral aspect; no corneal abrasions or ulcers  Neck: Neck supple.  Pulmonary/Chest: Effort normal.  Lymphadenopathy:    He has no cervical adenopathy.  Neurological: He is alert and oriented to person, place, and time.  Skin: Skin is warm and dry.  Psychiatric: He has a normal mood and affect. His behavior is normal.      Filed Vitals:   10/18/12 1053  BP: 160/93  Pulse: 82  Temp: 98 F (36.7 C)  Resp: 16       Assessment & Plan:   1. Conjunctivitis  trimethoprim-polymyxin b (POLYTRIM) ophthalmic solution       [name] is a pleasant 57 yr old male here with conjunctivitis.  On fluorescein exam there is no corneal abrasion or ulcer.  He is not a contact lens wearer.  Will treat with polytrim drops x 7 days.  Discussed RTC precautions.  Encouraged good hand hygiene to prevent the spread of disease.

## 2012-10-18 NOTE — Patient Instructions (Addendum)
Begin using the Polytrim drops every 4 hours (while awake) for the next two days, then can go to every 6 hours for the next five days (7 days total).  Do not rub or scratch the eye.  Wash hands frequently to prevent spreading to others.  If you are worsening or not improving, or if new symptoms develop, please let us know.   Conjunctivitis Conjunctivitis is commonly called "pink eye." Conjunctivitis can be caused by bacterial or viral infection, allergies, or injuries. There is usually redness of the lining of the eye, itching, discomfort, and sometimes discharge. There may be deposits of matter along the eyelids. A viral infection usually causes a watery discharge, while a bacterial infection causes a yellowish, thick discharge. Pink eye is very contagious and spreads by direct contact. You may be given antibiotic eyedrops as part of your treatment. Before using your eye medicine, remove all drainage from the eye by washing gently with warm water and cotton balls. Continue to use the medication until you have awakened 2 mornings in a row without discharge from the eye. Do not rub your eye. This increases the irritation and helps spread infection. Use separate towels from other household members. Wash your hands with soap and water before and after touching your eyes. Use cold compresses to reduce pain and sunglasses to relieve irritation from light. Do not wear contact lenses or wear eye makeup until the infection is gone. SEEK MEDICAL CARE IF:   Your symptoms are not better after 3 days of treatment.  You have increased pain or trouble seeing.  The outer eyelids become very red or swollen. Document Released: 09/30/2004 Document Revised: 11/15/2011 Document Reviewed: 08/23/2005 [facility] Patient Information 2013 [place], [place].

## 2012-11-16 ENCOUNTER — Inpatient Hospital Stay (HOSPITAL_COMMUNITY): Payer: BC Managed Care – PPO

## 2012-11-16 ENCOUNTER — Encounter (HOSPITAL_COMMUNITY): Payer: Self-pay | Admitting: Emergency Medicine

## 2012-11-16 ENCOUNTER — Inpatient Hospital Stay (HOSPITAL_COMMUNITY)
Admission: EM | Admit: 2012-11-16 | Discharge: 2012-11-17 | DRG: 014 | Disposition: A | Discharge status: Home / Self Care | Payer: BC Managed Care – PPO | Attending: Internal Medicine | Admitting: Internal Medicine

## 2012-11-16 ENCOUNTER — Emergency Department (HOSPITAL_COMMUNITY): Payer: BC Managed Care – PPO

## 2012-11-16 DIAGNOSIS — R4182 Altered mental status, unspecified: Secondary | ICD-10-CM

## 2012-11-16 DIAGNOSIS — J99 Respiratory disorders in diseases classified elsewhere: Secondary | ICD-10-CM | POA: Diagnosis present

## 2012-11-16 DIAGNOSIS — I639 Cerebral infarction, unspecified: Secondary | ICD-10-CM

## 2012-11-16 DIAGNOSIS — Z79899 Other long term (current) drug therapy: Secondary | ICD-10-CM

## 2012-11-16 DIAGNOSIS — I6350 Cerebral infarction due to unspecified occlusion or stenosis of unspecified cerebral artery: Principal | ICD-10-CM | POA: Diagnosis present

## 2012-11-16 DIAGNOSIS — D869 Sarcoidosis, unspecified: Secondary | ICD-10-CM | POA: Diagnosis present

## 2012-11-16 DIAGNOSIS — R4701 Aphasia: Secondary | ICD-10-CM

## 2012-11-16 DIAGNOSIS — R29810 Facial weakness: Secondary | ICD-10-CM | POA: Diagnosis present

## 2012-11-16 DIAGNOSIS — I10 Essential (primary) hypertension: Secondary | ICD-10-CM

## 2012-11-16 DIAGNOSIS — N183 Chronic kidney disease, stage 3 unspecified: Secondary | ICD-10-CM | POA: Diagnosis present

## 2012-11-16 DIAGNOSIS — F29 Unspecified psychosis not due to a substance or known physiological condition: Secondary | ICD-10-CM | POA: Diagnosis present

## 2012-11-16 DIAGNOSIS — I129 Hypertensive chronic kidney disease with stage 1 through stage 4 chronic kidney disease, or unspecified chronic kidney disease: Secondary | ICD-10-CM | POA: Diagnosis present

## 2012-11-16 DIAGNOSIS — E785 Hyperlipidemia, unspecified: Secondary | ICD-10-CM | POA: Diagnosis present

## 2012-11-16 DIAGNOSIS — M899 Disorder of bone, unspecified: Secondary | ICD-10-CM | POA: Diagnosis present

## 2012-11-16 DIAGNOSIS — R4789 Other speech disturbances: Secondary | ICD-10-CM | POA: Diagnosis present

## 2012-11-16 DIAGNOSIS — F17200 Nicotine dependence, unspecified, uncomplicated: Secondary | ICD-10-CM | POA: Diagnosis present

## 2012-11-16 DIAGNOSIS — K644 Residual hemorrhoidal skin tags: Secondary | ICD-10-CM | POA: Diagnosis present

## 2012-11-16 DIAGNOSIS — K648 Other hemorrhoids: Secondary | ICD-10-CM | POA: Diagnosis present

## 2012-11-16 DIAGNOSIS — F988 Other specified behavioral and emotional disorders with onset usually occurring in childhood and adolescence: Secondary | ICD-10-CM | POA: Diagnosis present

## 2012-11-16 DIAGNOSIS — IMO0002 Reserved for concepts with insufficient information to code with codable children: Secondary | ICD-10-CM

## 2012-11-16 HISTORY — DX: Hyperlipidemia, unspecified: E78.5

## 2012-11-16 LAB — POCT I-STAT, CHEM 8
BUN: 20 mg/dL (ref 6–23)
Calcium, Ion: 1.26 mmol/L — ABNORMAL HIGH (ref 1.12–1.23)
Chloride: 107 mEq/L (ref 96–112)
Creatinine, Ser: 1.5 mg/dL — ABNORMAL HIGH (ref 0.50–1.35)
Glucose, Bld: 98 mg/dL (ref 70–99)
HCT: 40 % (ref 39.0–52.0)
Hemoglobin: 13.6 g/dL (ref 13.0–17.0)
Potassium: 4 mEq/L (ref 3.5–5.1)
Sodium: 143 mEq/L (ref 135–145)
TCO2: 31 mmol/L (ref 0–100)

## 2012-11-16 LAB — CBC
HCT: 39.9 % (ref 39.0–52.0)
Hemoglobin: 13.6 g/dL (ref 13.0–17.0)
MCH: 28.8 pg (ref 26.0–34.0)
MCHC: 34.1 g/dL (ref 30.0–36.0)
MCV: 84.5 fL (ref 78.0–100.0)
Platelets: 201 10*3/uL (ref 150–400)
RBC: 4.72 MIL/uL (ref 4.22–5.81)
RDW: 14.3 % (ref 11.5–15.5)
WBC: 5.8 10*3/uL (ref 4.0–10.5)

## 2012-11-16 LAB — POCT I-STAT TROPONIN I: Troponin i, poc: 0 ng/mL (ref 0.00–0.08)

## 2012-11-16 LAB — URINALYSIS, ROUTINE W REFLEX MICROSCOPIC
Bilirubin Urine: NEGATIVE
Glucose, UA: NEGATIVE mg/dL
Hgb urine dipstick: NEGATIVE
Ketones, ur: NEGATIVE mg/dL
Leukocytes, UA: NEGATIVE
Nitrite: NEGATIVE
Protein, ur: 100 mg/dL — AB
Specific Gravity, Urine: 1.018 (ref 1.005–1.030)
Urobilinogen, UA: 0.2 mg/dL (ref 0.0–1.0)
pH: 6 (ref 5.0–8.0)

## 2012-11-16 LAB — ETHANOL: Alcohol, Ethyl (B): <11 mg/dL (ref 0–11)

## 2012-11-16 LAB — PROTIME-INR
INR: 1.02 (ref 0.00–1.49)
Prothrombin Time: 13.3 s (ref 11.6–15.2)

## 2012-11-16 LAB — URINE MICROSCOPIC-ADD ON

## 2012-11-16 LAB — RAPID URINE DRUG SCREEN, HOSP PERFORMED
Amphetamines: NOT DETECTED
Barbiturates: NOT DETECTED
Benzodiazepines: NOT DETECTED
Cocaine: NOT DETECTED
Opiates: NOT DETECTED
Tetrahydrocannabinol: NOT DETECTED

## 2012-11-16 LAB — LACTIC ACID, PLASMA: Lactic Acid, Venous: 1 mmol/L (ref 0.5–2.2)

## 2012-11-16 LAB — APTT: aPTT: 33 seconds (ref 24–37)

## 2012-11-16 MED ORDER — SODIUM CHLORIDE 0.9 % IV SOLN: Freq: Once | INTRAVENOUS | Status: DC

## 2012-11-16 MED ORDER — IRBESARTAN 150 MG PO TABS
150.0000 mg | ORAL_TABLET | Freq: Every day | ORAL | Status: DC
  Administered 2012-11-16 – 2012-11-17 (×2): 150 mg via ORAL
  Filled 2012-11-16 (×2): qty 1

## 2012-11-16 MED ORDER — LORAZEPAM 2 MG/ML IJ SOLN
2.0000 mg | Freq: Once | INTRAMUSCULAR | Status: AC
  Administered 2012-11-16: 2 mg via INTRAVENOUS
  Filled 2012-11-16: qty 1

## 2012-11-16 MED ORDER — LABETALOL HCL 300 MG PO TABS
300.0000 mg | ORAL_TABLET | Freq: Three times a day (TID) | ORAL | Status: DC
  Administered 2012-11-16 – 2012-11-17 (×3): 300 mg via ORAL
  Filled 2012-11-16 (×8): qty 1

## 2012-11-16 MED ORDER — ASPIRIN 300 MG RE SUPP: 300.0000 mg | Freq: Once | RECTAL | Status: DC

## 2012-11-16 MED ORDER — PREDNISONE 5 MG PO TABS
5.0000 mg | ORAL_TABLET | Freq: Every day | ORAL | Status: DC
Start: 2012-11-17 — End: 2012-11-17
  Administered 2012-11-17: 5 mg via ORAL
  Filled 2012-11-16 (×2): qty 1

## 2012-11-16 MED ORDER — SODIUM CHLORIDE 0.9 % IV SOLN: INTRAVENOUS | Status: DC

## 2012-11-16 MED ORDER — ZOLPIDEM TARTRATE 5 MG PO TABS
10.0000 mg | ORAL_TABLET | Freq: Every day | ORAL | Status: DC
  Administered 2012-11-16: 10 mg via ORAL
  Filled 2012-11-16: qty 1

## 2012-11-16 MED ORDER — ENALAPRIL MALEATE 5 MG PO TABS
5.0000 mg | ORAL_TABLET | Freq: Every day | ORAL | Status: DC
  Administered 2012-11-17: 5 mg via ORAL
  Filled 2012-11-16 (×2): qty 1

## 2012-11-16 MED ORDER — ENOXAPARIN SODIUM 40 MG/0.4ML ~~LOC~~ SOLN
40.0000 mg | SUBCUTANEOUS | Status: DC
  Administered 2012-11-16: 40 mg via SUBCUTANEOUS
  Filled 2012-11-16 (×2): qty 0.4

## 2012-11-16 MED ORDER — ACETAMINOPHEN 325 MG PO TABS: 650.0000 mg | ORAL_TABLET | ORAL | Status: DC | PRN

## 2012-11-16 MED ORDER — DILTIAZEM HCL ER BEADS 240 MG PO CP24
360.0000 mg | ORAL_CAPSULE | Freq: Every day | ORAL | Status: DC
  Filled 2012-11-16 (×2): qty 1

## 2012-11-16 MED ORDER — DOXAZOSIN MESYLATE 1 MG PO TABS
1.0000 mg | ORAL_TABLET | Freq: Every day | ORAL | Status: DC
  Administered 2012-11-16: 1 mg via ORAL
  Filled 2012-11-16 (×2): qty 1

## 2012-11-16 MED ORDER — ACETAMINOPHEN 650 MG RE SUPP: 650.0000 mg | RECTAL | Status: DC | PRN

## 2012-11-16 MED ORDER — ASPIRIN 300 MG RE SUPP
300.0000 mg | Freq: Every day | RECTAL | Status: DC
  Filled 2012-11-16: qty 1

## 2012-11-16 MED ORDER — ASPIRIN 325 MG PO TABS
325.0000 mg | ORAL_TABLET | Freq: Every day | ORAL | Status: DC
  Administered 2012-11-17: 325 mg via ORAL
  Filled 2012-11-16: qty 1

## 2012-11-16 MED ORDER — LORAZEPAM 1 MG PO TABS: 2.0000 mg | ORAL_TABLET | Freq: Once | ORAL | Status: DC

## 2012-11-16 MED ORDER — ASPIRIN 81 MG PO CHEW
324.0000 mg | CHEWABLE_TABLET | Freq: Once | ORAL | Status: AC
  Administered 2012-11-16: 324 mg via ORAL
  Filled 2012-11-16: qty 4

## 2012-11-16 NOTE — ED Notes (Signed)
Patient with slurred speech, left facial droop.  Wife states that he was awake at 0500 this am, stumbling around the house with slurred speech getting worse as the morning went on.  Wife took his blood pressure at home, 163/110.

## 2012-11-16 NOTE — Progress Notes (Signed)
*  PRELIMINARY RESULTS* Vascular Ultrasound Carotid Duplex (Doppler) has been completed.  Preliminary findings: Bilateral: No hemodynamically significant ICA stenosis. Vertebral artery flow is antegrade.  Cathie Beams 11/16/2012, 2:12 PM

## 2012-11-16 NOTE — ED Notes (Signed)
Pt taken to CT with this RN on zoll. Returned to room and placed back on cardiac monitor. Pt remains lethargic at this time, but alert to self and family. Pt with generalized weakness. Pt able to follow commands. Denies any pain. EDP at the bedside. No distress noted.

## 2012-11-16 NOTE — Consult Note (Signed)
Referring Physician: Powers    Chief Complaint: confusion and left sided weakness  HPI:                                                                                                                                         [name] is an 57 y.o. male who was LKW at ( PM. Wife went to sleep and awoke about 7 AM this morning to find her husband in house trying to turn off the alarm. Patient was confused, seemed to be not moving his left leg correctly, wife felt he was slurring his words.  Patient remembers going outside to his truck but cannot recall why. He was brought to [place] for further evaluation. HE has history of HTN and Sarcoidosis and smokes .5 Pks cigarettes a day. On consultation he shows slurred voice, he is alert and able to follow commands.   LKW: 9 pm 10/18/12 tPA Given: No: out of window  Past Medical History  Diagnosis Date  . Pancreatitis   . Hypertension   . Sarcoidosis   . Hyperlipidemia     Past Surgical History  Procedure Laterality Date  . Cholecystectomy    . Shoulder surgery    . Back surgery    . Abdominal exploration surgery      Family History  Problem Relation Age of Onset  . Hypertension Father    Social History:  reports that he has been smoking Cigarettes.  He has been smoking about 0.00 packs per day. He does not have any smokeless tobacco history on file. He reports that he does not drink alcohol or use illicit drugs.  Allergies:  Allergies  Allergen Reactions  . Codeine     REACTION: Reaction not known  . Hydromorphone Other (See Comments)    hallucinations     Medications:                                                                                                                           Current Facility-Administered Medications  Medication Dose Route Frequency Provider Last Rate Last Dose  . 0.9 %  sodium chloride infusion   Intravenous Continuous Gwen Pounds, MD       Current Outpatient Prescriptions  Medication  Sig Dispense Refill  . diltiazem (TIAZAC) 360 MG 24 hr capsule TAKE 1 CAPSULE BY MOUTH ONCE DAILY  30 capsule  11  . doxazosin (CARDURA) 2 MG tablet Take 1 mg by mouth at bedtime.      . enalapril (VASOTEC) 5 MG tablet Take 5 mg by mouth daily.      Marland Kitchen labetalol (NORMODYNE) 300 MG tablet Take 300 mg by mouth 3 (three) times daily.      Marland Kitchen olmesartan (BENICAR) 40 MG tablet Take 40 mg by mouth daily.      . predniSONE (DELTASONE) 5 MG tablet Take 5 mg by mouth daily.      Marland Kitchen zolpidem (AMBIEN) 10 MG tablet Take 10 mg by mouth at bedtime.         ROS:                                                                                                                                       History obtained from the patient  General ROS: negative for - chills, fatigue, fever, night sweats, weight gain or weight loss Psychological ROS: negative for - behavioral disorder, hallucinations, memory difficulties, mood swings or suicidal ideation Ophthalmic ROS: negative for - blurry vision, double vision, eye pain or loss of vision ENT ROS: negative for - epistaxis, nasal discharge, oral lesions, sore throat, tinnitus or vertigo Allergy and Immunology ROS: negative for - hives or itchy/watery eyes Hematological and Lymphatic ROS: negative for - bleeding problems, bruising or swollen lymph nodes Endocrine ROS: negative for - galactorrhea, hair pattern changes, polydipsia/polyuria or temperature intolerance Respiratory ROS: negative for - cough, hemoptysis, shortness of breath or wheezing Cardiovascular ROS: negative for - chest pain, dyspnea on exertion, edema or irregular heartbeat Gastrointestinal ROS: negative for - abdominal pain, diarrhea, hematemesis, nausea/vomiting or stool incontinence Genito-Urinary ROS: negative for - dysuria, hematuria, incontinence or urinary frequency/urgency Musculoskeletal ROS: negative for - joint swelling or muscular weakness Neurological ROS: as noted in HPI Dermatological  ROS: negative for rash and skin lesion changes  Neurologic Examination:                                                                                                      Blood pressure 144/103, pulse 74, temperature 98.2 F (36.8 C), temperature source Oral, resp. rate 18, SpO2 99.00%.  Mental Status: Alert, oriented, thought content appropriate.  Speech slurred but fluent without evidence of aphasia.  Mildly dysarthric.  Able to follow 3 step commands without difficulty. Cranial Nerves: II: Discs flat bilaterally; Visual fields grossly normal, pupils equal, round, reactive to light and accommodation III,IV,  VI: ptosis present on left eye, extra-ocular motions intact bilaterally V,VII: smile asymmetric with no left forehead furrowing, facial light touch sensation normal bilaterally VIII: hearing normal bilaterally IX,X: gag reflex present XI: bilateral shoulder shrug XII: midline tongue extension Motor: Right : Upper extremity   5/5    Left:     Upper extremity   4/5 --left weakness is more proximal with left arm drift  Lower extremity   5/5     Lower extremity   5/5 Tone and bulk:normal tone throughout; no atrophy noted Sensory: Pinprick and light touch intact throughout, bilaterally--states his hand feels "tingly" Deep Tendon Reflexes: 2+ and symmetric throughout Plantars: Right: downgoing   Left: downgoing Cerebellar: normal finger-to-nose,  normal heel-to-shin test CV: pulses palpable throughout    Results for orders placed during the hospital encounter of 11/16/12 (from the past 48 hour(s))  CBC     Status: None   Collection Time    11/16/12  8:04 AM      Result Value Range   WBC 5.8  4.0 - 10.5 K/uL   RBC 4.72  4.22 - 5.81 MIL/uL   Hemoglobin 13.6  13.0 - 17.0 g/dL   HCT 16.1  09.6 - 04.5 %   MCV 84.5  78.0 - 100.0 fL   MCH 28.8  26.0 - 34.0 pg   MCHC 34.1  30.0 - 36.0 g/dL   RDW 40.9  81.1 - 91.4 %   Platelets 201  150 - 400 K/uL  ETHANOL     Status: None    Collection Time    11/16/12  8:05 AM      Result Value Range   Alcohol, Ethyl (B) <11  0 - 11 mg/dL   Comment:            LOWEST DETECTABLE LIMIT FOR     SERUM ALCOHOL IS 11 mg/dL     FOR MEDICAL PURPOSES ONLY  PROTIME-INR     Status: None   Collection Time    11/16/12  8:23 AM      Result Value Range   Prothrombin Time 13.3  11.6 - 15.2 seconds   INR 1.02  0.00 - 1.49  APTT     Status: None   Collection Time    11/16/12  8:23 AM      Result Value Range   aPTT 33  24 - 37 seconds  LACTIC ACID, PLASMA     Status: None   Collection Time    11/16/12  8:24 AM      Result Value Range   Lactic Acid, Venous 1.0  0.5 - 2.2 mmol/L  POCT I-STAT TROPONIN I     Status: None   Collection Time    11/16/12  8:50 AM      Result Value Range   Troponin i, poc 0.00  0.00 - 0.08 ng/mL   Comment 3            Comment: Due to the release kinetics of cTnI,     a negative result within the first hours     of the onset of symptoms does not rule out     myocardial infarction with certainty.     If myocardial infarction is still suspected,     repeat the test at appropriate intervals.  POCT I-STAT, CHEM 8     Status: Abnormal   Collection Time    11/16/12  8:52 AM      Result Value Range   Sodium  143  135 - 145 mEq/L   Potassium 4.0  3.5 - 5.1 mEq/L   Chloride 107  96 - 112 mEq/L   BUN 20  6 - 23 mg/dL   Creatinine, Ser 9.60 (*) 0.50 - 1.35 mg/dL   Glucose, Bld 98  70 - 99 mg/dL   Calcium, Ion 4.54 (*) 1.12 - 1.23 mmol/L   TCO2 31  0 - 100 mmol/L   Hemoglobin 13.6  13.0 - 17.0 g/dL   HCT 09.8  11.9 - 14.7 %   Dg Chest 2 View  11/16/2012  *RADIOLOGY REPORT*  Clinical Data: Mental status changes, weakness.  CHEST - 2 VIEW  Comparison: 02/01/2011  Findings: Cardiomegaly.  Mild vascular congestion.  No confluent airspace opacities or effusions.  No overt edema.  No acute bony abnormality.  IMPRESSION: Cardiomegaly, mild vascular congestion.   Original Report Authenticated By: Charlett Nose, M.D.     Ct Head Wo Contrast  11/16/2012  *RADIOLOGY REPORT*  Clinical Data: Elevated blood pressure.  Slurred speech. Lethargic.  CT HEAD WITHOUT CONTRAST  Technique:  Contiguous axial images were obtained from the base of the skull through the vertex without contrast.  Comparison: None.  Findings: Question left middle cerebral artery distribution acute infarct with slight indistinctness of gray-white differentiation and slightly hyperdense left middle cerebral artery.  Small vessel disease type changes.  No intracranial hemorrhage.  No intracranial mass lesion detected on this unenhanced exam.  No hydrocephalus.  Visualized sinuses, mastoid air cells middle ear cavities are clear.  IMPRESSION: Question acute left middle cerebral artery distribution infarct.  Please see above.  This has been made a PRA call report utilizing dashboard call feature.   Original Report Authenticated By: Lacy Duverney, M.D.     Assessment and plan discussed with with attending physician and they are in agreement.    Felicie Morn PA-C [facility] [phone]  11/16/2012, 9:37 AM  Patient seen and examined.  Clinical course and management discussed.  Necessary edits performed.  I agree with the above.  Assessment and plan of care developed and discussed below.    Assessment: 57 y.o. male presenting with dysarthria and left sided weakness.  Suspect ischemia.  CT unremarkable.  Patient with risk factors for small vessel disease.  Patient on no antiplatelet therapy at home.    Stroke Risk Factors - hypertension and smoking  Plan: 1. HgbA1c, fasting lipid panel 2. MRI, MRA  of the brain without contrast 3. PT consult, OT consult, Speech consult 4. Echocardiogram 5. Carotid dopplers 6. Prophylactic therapy-Antiplatelet med: Aspirin - dose 325mg  daily 7. Risk factor modification 8. Telemetry monitoring 9. Frequent neuro checks

## 2012-11-16 NOTE — ED Notes (Addendum)
PT ALSO TOOK MORNING PREDNISONE and BENICAR

## 2012-11-16 NOTE — ED Notes (Signed)
Patient went to MRI before being placed in Room 25.

## 2012-11-16 NOTE — ED Notes (Signed)
Patient arrived to POD C #25 from MRI.

## 2012-11-16 NOTE — H&P (Signed)
[name] is an 56 y.o. male.   PCP:   Manuel Pounds, MD   Chief Complaint:  Slurred speech, facial droop and L sided weakness.  HPI: 32 Male with Sarcoidosis,   HTN,   Hyperlipidemia.   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  Nephrocalcinosis,   Severe Gallstone Pancreatitis 7/02-S/P Laprascopic Cholecystectomy 12/02-Tracheostomy. H/O Recurrent Retroperitoneal Abscesses.   Osteopenia,   CKD 3 c baseline Cr 1.6 c Macroalbuminuria/Proteinuria and ?FSGS who was last seen by me in 11/2011 and missed his 05/2012 appt.  He is maintained on 5mg  Prednisone for the Sarcoidosis and Hypercalcemia.  At times he has had wildly fluctuating BPs.and I co-manage his BP and CKD with Manuel [name] from Nephrology.  The Pt was in his usual state of health when he went to bed last night.  He took his Ambien and apparently took some benedryl as well.  This am his wife noticed Slurred speech, facial droop, L sided weakness, confusion and mental slowing.  She brought him to the ED.  BP last night was 130/80-90 and this am 160/110.  In the ED CCT showed possible CVA and I was called for inpt admit.  Neuro consulted.  I saw him in the ED and facial droop was gone, weakness had resolved.  He was still slow and had slurred speech.  It was almost as though he was overmedicated.  Will admit for eval and Rx.  Also I had been unaware that he picked smoking back up and has been doing 1/2 PPD - Needs to quit.      Past Medical History:  Past Medical History  Diagnosis Date  . Pancreatitis   . Hypertension   . Sarcoidosis   . Hyperlipidemia      Pulmonary Sarcoidosis,   HTN.   Hyperlipidemia.   ADD.   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  Nephrocalcinosis,   Severe Gallstone Pancreatitis 7/02-S/P Laprascopic Cholecystectomy 12/02-Tracheostomy. H/O Recurrent Retroperitoneal Abscesses.   Osteopenia,   Small Int/Ext Hemrrhoids--[name],   Epididymitis,   CR 1.6,   Macroalbuminuria/Proteinuria.   Nephrolitiasis and stone obstruction needing extraction and stent [name] 02/2010.  Kidney Bx 02/2010.   Past Surgical History  Procedure Laterality Date  . Cholecystectomy    . Shoulder surgery    . Back surgery    . Abdominal exploration surgery       S/P Laprascopic Cholecystectomy 12/02 Tracheostomy--closed Ex Lap 2005 and I&D of abscess. L Rotator Cuff Surgery 09/10/10    Allergies:   Allergies  Allergen Reactions  . Codeine     REACTION: Reaction not known  . Hydromorphone Other (See Comments)    hallucinations      Medications: Prior to Admission medications   Medication Sig Start Date End Date Taking? Authorizing Provider  diltiazem (TIAZAC) 360 MG 24 hr capsule TAKE 1 CAPSULE BY MOUTH ONCE DAILY 07/25/11  Yes Manuel Stade, MD  doxazosin (CARDURA) 2 MG tablet Take 1 mg by mouth at bedtime.   Yes Historical Provider, MD  enalapril (VASOTEC) 5 MG tablet Take 5 mg by mouth daily.   Yes Historical Provider, MD  labetalol (NORMODYNE) 300 MG tablet Take 300 mg by mouth 3 (three) times daily.   Yes Historical Provider, MD  olmesartan (BENICAR) 40 MG tablet Take 40 mg by mouth daily.   Yes Historical Provider, MD  predniSONE (DELTASONE) 5 MG tablet Take 5 mg by mouth daily.   Yes Historical Provider, MD  zolpidem (AMBIEN) 10 MG tablet  Take 10 mg by mouth at bedtime.   Yes Historical Provider, MD      (Not in a hospital admission)   Social History:  reports that he has been smoking Cigarettes.  He has been smoking about 0.00 packs per day. He does not have any smokeless tobacco history on file. He reports that he does not drink alcohol or use illicit drugs. Married. @ daughters. 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    Family History: Family History  Problem Relation Age of Onset  . Hypertension Father   Father D,HTN, CVA, Lung Cancer Mother, healthy Brother, healthy Sister HTN   Review of Systems:  Review of Systems -      Complains of urinary frequency, nocturia,  erectile dysfunction.        Complains of stiffness, arthritis.   Denies CP SOB Lethargic. Recent increase in allergies. Full ROS obtained Weakness and FTT          Physical Exam:  Blood pressure 135/85, pulse 68, temperature 98.2 F (36.8 C), temperature source Oral, resp. rate 18, height 5' 10.87" (1.8 m), weight 113.7 kg (250 lb 10.6 oz), SpO2 99.00%. Filed Vitals:   11/16/12 0745 11/16/12 0747 11/16/12 1001 11/16/12 1114  BP:  144/103 135/85   Pulse:  74 68   Temp: 98.2 F (36.8 C)     TempSrc:      Resp:  18 18   Height:    5' 10.87" (1.8 m)  Weight:    113.7 kg (250 lb 10.6 oz)  SpO2:  99% 99%    General appearance: Not himself.  Out of it.  Slurred and slow/Lethargic and disoriented. Head: Normocephalic, without obvious abnormality, atraumatic Eyes: conjunctivae/corneas clear. PERRL, EOM's intact.  Nose: Nares normal. Septum midline. Mucosa normal. No drainage or sinus tenderness. Throat: lips, mucosa, and tongue normal; teeth and gums normal Neck - Trach scar Neck: no adenopathy, no carotid bruit, no JVD and thyroid not enlarged, symmetric, no tenderness/mass/nodules Resp: CTA B  Cardio: Regular GI: soft, non-tender; bowel sounds normal; no masses,  no organomegaly. Obese and scars noted Extremities: extremities normal, atraumatic, no cyanosis or edema.  Moving all 4s.  Strength fine.  Following commands Pulses: 2+ and symmetric Lymph nodes:  no cervical lymphadenopathy Neurologic: normal strength and tone. Normal symmetric reflexes.     Labs on Admission:   Recent Labs  11/16/12 0852  NA 143  K 4.0  CL 107  GLUCOSE 98  BUN 20  CREATININE 1.50*   No results found for this basename: AST, ALT, ALKPHOS, BILITOT, PROT, ALBUMIN,  in the last 72 hours No results found for this basename: LIPASE, AMYLASE,  in the last 72 hours  Recent Labs  11/16/12 0804 11/16/12 0852  WBC 5.8  --   HGB 13.6 13.6  HCT 39.9 40.0  MCV 84.5  --   PLT 201  --    No  results found for this basename: CKTOTAL, CKMB, CKMBINDEX, TROPONINI,  in the last 72 hours Lab Results  Component Value Date   INR 1.02 11/16/2012   INR 1.10 03/02/2010   INR 1.0 04/27/2009     LAB RESULT POCT:  Results for orders placed during the hospital encounter of 11/16/12  CBC      Result Value Range   WBC 5.8  4.0 - 10.5 K/uL   RBC 4.72  4.22 - 5.81 MIL/uL   Hemoglobin 13.6  13.0 - 17.0 g/dL   HCT 16.1  09.6 - 04.5 %  MCV 84.5  78.0 - 100.0 fL   MCH 28.8  26.0 - 34.0 pg   MCHC 34.1  30.0 - 36.0 g/dL   RDW 81.1  91.4 - 78.2 %   Platelets 201  150 - 400 K/uL  PROTIME-INR      Result Value Range   Prothrombin Time 13.3  11.6 - 15.2 seconds   INR 1.02  0.00 - 1.49  APTT      Result Value Range   aPTT 33  24 - 37 seconds  LACTIC ACID, PLASMA      Result Value Range   Lactic Acid, Venous 1.0  0.5 - 2.2 mmol/L  ETHANOL      Result Value Range   Alcohol, Ethyl (B) <11  0 - 11 mg/dL  URINALYSIS, ROUTINE W REFLEX MICROSCOPIC      Result Value Range   Color, Urine YELLOW  YELLOW   APPearance CLEAR  CLEAR   Specific Gravity, Urine 1.018  1.005 - 1.030   pH 6.0  5.0 - 8.0   Glucose, UA NEGATIVE  NEGATIVE mg/dL   Hgb urine dipstick NEGATIVE  NEGATIVE   Bilirubin Urine NEGATIVE  NEGATIVE   Ketones, ur NEGATIVE  NEGATIVE mg/dL   Protein, ur 956 (*) NEGATIVE mg/dL   Urobilinogen, UA 0.2  0.0 - 1.0 mg/dL   Nitrite NEGATIVE  NEGATIVE   Leukocytes, UA NEGATIVE  NEGATIVE  URINE RAPID DRUG SCREEN (HOSP PERFORMED)      Result Value Range   Opiates NONE DETECTED  NONE DETECTED   Cocaine NONE DETECTED  NONE DETECTED   Benzodiazepines NONE DETECTED  NONE DETECTED   Amphetamines NONE DETECTED  NONE DETECTED   Tetrahydrocannabinol NONE DETECTED  NONE DETECTED   Barbiturates NONE DETECTED  NONE DETECTED  URINE MICROSCOPIC-ADD ON      Result Value Range   WBC, UA 3-6  <3 WBC/hpf   RBC / HPF 0-2  <3 RBC/hpf   Bacteria, UA RARE  RARE  POCT I-STAT, CHEM 8      Result Value Range    Sodium 143  135 - 145 mEq/L   Potassium 4.0  3.5 - 5.1 mEq/L   Chloride 107  96 - 112 mEq/L   BUN 20  6 - 23 mg/dL   Creatinine, Ser 2.13 (*) 0.50 - 1.35 mg/dL   Glucose, Bld 98  70 - 99 mg/dL   Calcium, Ion 0.86 (*) 1.12 - 1.23 mmol/L   TCO2 31  0 - 100 mmol/L   Hemoglobin 13.6  13.0 - 17.0 g/dL   HCT 57.8  46.9 - 62.9 %  POCT I-STAT TROPONIN I      Result Value Range   Troponin i, poc 0.00  0.00 - 0.08 ng/mL   Comment 3               Radiological Exams on Admission: Dg Chest 2 View  11/16/2012  *RADIOLOGY REPORT*  Clinical Data: Mental status changes, weakness.  CHEST - 2 VIEW  Comparison: 02/01/2011  Findings: Cardiomegaly.  Mild vascular congestion.  No confluent airspace opacities or effusions.  No overt edema.  No acute bony abnormality.  IMPRESSION: Cardiomegaly, mild vascular congestion.   Original Report Authenticated By: Charlett Nose, M.D.    Ct Head Wo Contrast  11/16/2012  *RADIOLOGY REPORT*  Clinical Data: Elevated blood pressure.  Slurred speech. Lethargic.  CT HEAD WITHOUT CONTRAST  Technique:  Contiguous axial images were obtained from the base of the skull through the vertex without  contrast.  Comparison: None.  Findings: Question left middle cerebral artery distribution acute infarct with slight indistinctness of gray-white differentiation and slightly hyperdense left middle cerebral artery.  Small vessel disease type changes.  No intracranial hemorrhage.  No intracranial mass lesion detected on this unenhanced exam.  No hydrocephalus.  Visualized sinuses, mastoid air cells middle ear cavities are clear.  IMPRESSION: Question acute left middle cerebral artery distribution infarct.  Please see above.  This has been made a PRA call report utilizing dashboard call feature.   Original Report Authenticated By: Lacy Duverney, M.D.       Orders placed during the hospital encounter of 11/16/12  . ED EKG  . ED EKG  . EKG 12-LEAD  . EKG 12-LEAD   NSR- left anterior  fascicular block, poor R progression   Assessment/Plan Active Problems:   * No active hospital problems. *   Slurred speech, facial droop, L sided weakness, confusion and mental slowing concerning for CVA - CCT is already +.  Admit get MRI/MRA/Carotids/ECHO and work on RF Modification.  Already passed swallowing screen so OK to feed.  1/2 PPD - Needs to quit.  HTN - continue home meds.  Outpt goal <135/85.  Monitor here without overdoing it. His OutPt medication list for this problem includes:    Doxazosin Mesylate 2 Mg Tabs (Doxazosin mesylate) .Marland Kitchen... Take one tablet by mouth one time daily    Benicar 40 Mg Tabs (Olmesartan medoxomil) .Marland Kitchen... Take 1 tablet by mouth once daily    Cardizem Cd 360 Mg Xr24h-cap (Diltiazem hcl coated beads) .Marland Kitchen... Take one capsule by mouth every day    Furosemide 80 Mg Tabs (Furosemide) .Marland Kitchen... 1 po qd    Labetalol Hcl 300 Mg Tabs (Labetalol hcl) ..... One po tid    Enalapril Maleate 5 Mg Tabs (Enalapril maleate) .Marland Kitchen... 1 po qd  Hyperlipidemia - We had him on Simvastatin and he took himself off of it @ 1 yr ago.  In 11/2011:   CHOLESTEROL               186 mg/dl         TRIGLYCERIDES        [H]  208 mg/dl        HDL                              [L]  38 mg/dl            LDL                                   106 mg/dl        CKD 3 c baseline Cr 1.6 c Macroalbuminuria/Proteinuria and ?FSGS - managed with BP control by me and Manuel Lanier Clam  Insomnia - Hold Ambien 10 HS.  Obesity with BMI 34+ - Needs weight loss.  Allergies - Hold Antihistamines.  IFG - Last A1C 5.5% 11/2011  OA - Per Manuel Jannette Spanner - No NSAIDs.  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  Nephrocalcinosis  S/P Prior Stone extractions.  Severe Gallstone Pancreatitis 7/02-S/P Laprascopic Cholecystectomy 12/02-Tracheostomy.  Complete Medication List: 1)  Doxazosin Mesylate 4 Mg Tabs (Doxazosin mesylate) .... Take one tablet by  mouth one time daily 2)  Doxycycline Hyclate 100 Mg Caps (Doxycycline hyclate) .... One po bid 3)  Aspirin 81 Mg Ec Tab (Aspirin) .... Take one (1) tablet by mouth daily 4)  Nitroglycerin 0.4 Mg Subl (Nitroglycerin) .... Put on tablet under the tongue as needed for chest pain may repeat in 5 minutes 5)  Benicar 40 Mg Tabs (Olmesartan medoxomil) .... Take 1 tablet by mouth once daily 6)  Cardizem Cd 360 Mg Xr24h-cap (Diltiazem hcl coated beads) .... Take one capsule by mouth every day 7)  Ambien 10 Mg Tabs (Zolpidem tartrate) .... One po q hs 8)  Furosemide 80 Mg Tabs (Furosemide) .Marland Kitchen.. 1 po qd 9)  Prednisone 5 Mg Tabs (Prednisone) .Marland Kitchen.. 1 po daily 10)  Labetalol Hcl 300 Mg Tabs (Labetalol hcl) .... One po tid 11)  Enalapril Maleate 5 Mg Tabs (Enalapril maleate) .Marland Kitchen.. 1 po qd     RUSSO,JOHN M 11/16/2012, 1:26 PM

## 2012-11-16 NOTE — ED Provider Notes (Signed)
History     CSN: [id number]  Arrival date & time 11/16/12  4098   First MD Initiated Contact with Patient 11/16/12 0715      Chief Complaint  Patient presents with  . Weakness    (Consider location/radiation/quality/duration/timing/severity/associated sxs/prior treatment) HPI Comments: Mr. Stoudt presents with his wife for evaluation.  He took an [place] last night, as usual, around 2000 and went to bed shortly after 2200.  She found him awake and wandering the home around 0500.  He appeared confused and she believes there was drooping to the left side of his face.  She reports his speech was heavy and she could not make out the words.  His blood pressure was elevated.  She initially thought it might have been lingering effects from the [place], however it did not improve over the next hour prompting her to bring him to the ER.  He denies having any pain.  He denies any difficulty breathing beyond his chronic sinus problems.  He has never experienced any similar issues and has not been recently hospitalized.  Patient is a 57 y.o. male presenting with extremity weakness. The history is provided by the patient and the spouse. The history is limited by the condition of the patient (pt is altred, confused, speech is not clear).  Extremity Weakness This is a new problem. The current episode started 6 to 12 hours ago. The problem has been gradually improving. Pertinent negatives include no chest pain, no abdominal pain, no headaches and no shortness of breath. Nothing aggravates the symptoms. Nothing relieves the symptoms. He has tried nothing for the symptoms.    Past Medical History  Diagnosis Date  . Pancreatitis   . Hypertension   . Sarcoidosis   . Hyperlipidemia     Past Surgical History  Procedure Laterality Date  . Cholecystectomy    . Shoulder surgery    . Back surgery    . Abdominal exploration surgery      Family History  Problem Relation Age of Onset  . Hypertension Father      History  Substance Use Topics  . Smoking status: Current Some Day Smoker    Types: Cigarettes  . Smokeless tobacco: Not on file  . Alcohol Use: No      Review of Systems  Unable to perform ROS: Mental status change  Constitutional: Negative for fever, activity change and appetite change.  HENT: Positive for congestion, postnasal drip and sinus pressure (chronic). Negative for hearing loss, sore throat and drooling.   Eyes: Negative for visual disturbance.  Respiratory: Negative for cough, chest tightness and shortness of breath.   Cardiovascular: Negative for chest pain and palpitations.  Gastrointestinal: Negative for nausea, vomiting and abdominal pain.  Musculoskeletal: Positive for extremity weakness. Negative for gait problem.  Neurological: Positive for speech difficulty and weakness. Negative for headaches.  Psychiatric/Behavioral: Positive for confusion and decreased concentration.    Allergies  Codeine and Hydromorphone  Home Medications   Current Outpatient Rx  Name  Route  Sig  Dispense  Refill  . diltiazem (TIAZAC) 360 MG 24 hr capsule      TAKE 1 CAPSULE BY MOUTH ONCE DAILY   30 capsule   11   . doxazosin (CARDURA) 2 MG tablet   Oral   Take 1 mg by mouth at bedtime.         . enalapril (VASOTEC) 10 MG tablet   Oral   Take 5 mg by mouth daily.          Marland Kitchen  olmesartan (BENICAR) 20 MG tablet   Oral   Take 40 mg by mouth daily.          . predniSONE (DELTASONE) 5 MG tablet   Oral   Take 5 mg by mouth daily.         Marland Kitchen trimethoprim-polymyxin b (POLYTRIM) ophthalmic solution   Left Eye   Place 1 drop into the left eye every 4 (four) hours. For 7 days   10 mL   0   . zolpidem (AMBIEN) 10 MG tablet   Oral   Take 10 mg by mouth at bedtime.           BP 139/94  Pulse 77  Temp(Src) 98.1 F (36.7 C) (Oral)  Resp 20  SpO2 97%  Physical Exam  Nursing note and vitals reviewed. Constitutional: He appears well-developed and  well-nourished. He appears lethargic. No distress.  HENT:  Head: Normocephalic and atraumatic.  Right Ear: External ear normal.  Left Ear: External ear normal.  Nose: Nose normal.  Mouth/Throat: Oropharynx is clear and moist. No oropharyngeal exudate.  Eyes: Conjunctivae and EOM are normal. Pupils are equal, round, and reactive to light. Right eye exhibits no discharge. Left eye exhibits no discharge. No scleral icterus.  No nystagmus   Neck: No JVD present. No tracheal deviation present.  Cardiovascular: Normal rate, regular rhythm, normal heart sounds and intact distal pulses.  Exam reveals no gallop and no friction rub.   No murmur heard. Pulmonary/Chest: Effort normal and breath sounds normal. No stridor. No respiratory distress. He has no wheezes. He has no rales. He exhibits no tenderness.  Abdominal: Soft. Bowel sounds are normal. He exhibits no distension and no mass. There is no tenderness. There is no rebound and no guarding.  Lymphadenopathy:    He has no cervical adenopathy.  Neurological: He appears lethargic. He is disoriented. He displays no atrophy and no tremor. No cranial nerve deficit or sensory deficit. He exhibits normal muscle tone. Coordination abnormal. GCS eye subscore is 4. GCS verbal subscore is 4. GCS motor subscore is 6. He displays no Babinski's sign on the right side. He displays no Babinski's sign on the left side.  No noted facial droop or tongue deviation.  Pt's speech is at times unclear, garbled, incoherent.  Abnl finger to nose and rapid alternating movements.  Skin: Skin is warm and dry. No rash noted. He is not diaphoretic. No cyanosis or erythema. No pallor. Nails show no clubbing.  Psychiatric: He has a normal mood and affect. His speech is normal. He is slowed. Cognition and memory are impaired. He exhibits abnormal recent memory and abnormal remote memory.    ED Course  Procedures (including critical care time)  Labs Reviewed  CBC   No results  found.   No diagnosis found.   Date: 11/16/2012 @0659   Rate: 76 bpm  Rhythm: sinus  QRS Axis: left  Intervals: QT prolonged (QTc 508 ms)  ST/T Wave abnormalities: nonspecific T wave changes  Conduction Disutrbances:left anterior fascicular block, poor R progression  Narrative Interpretation:   Old EKG Reviewed: none available      MDM  Pt presents for evaluation of mild confusion and new onset weakness and confusion.  He appears nontoxic, note elevated BP, NAD.  He awoke this morning with symptoms and was last seen normal @ 2200.  He has no noted facial droop or unilateral weakness but he appears confused, somnolent, and has a coordination deficit.   Will obtain basic labs, CXR,  head CT, drug and alcohol screen, and lactic acid.  Ordered swallow evaluation.  Will reassess.  0800.  Radiology tech called report of possible left MCA infarction.  No ICH noted.  Ordered rectal aspirin.  Will consult the on-call neurohospitalist and move towards admission as further test results return.  6440.  Discussed his evaluation with Dr. Thad Ranger (neurohospitalist) and Dr. Timothy Lasso (his PMD).  He will be admitted to the hospital for further mgmnt.  He had a normal swallow screen and was administered PO aspirin.    CRITICAL CARE Performed by: Dana Allan T   Total critical care time: 30  Critical care time was exclusive of separately billable procedures and treating other patients.  Critical care was necessary to treat or prevent imminent or life-threatening deterioration.  Critical care was time spent personally by me on the following activities: development of treatment plan with patient and/or surrogate as well as nursing, discussions with consultants, evaluation of patient's response to treatment, examination of patient, obtaining history from patient or surrogate, ordering and performing treatments and interventions, ordering and review of laboratory studies, ordering and review of  radiographic studies, pulse oximetry and re-evaluation of patient's condition.\         Tobin Chad, MD 11/16/12 [phone]

## 2012-11-16 NOTE — Progress Notes (Signed)
  Echocardiogram 2D Echocardiogram has been performed.  [name], [name] 11/16/2012, 12:54 PM

## 2012-11-17 LAB — LIPID PANEL
Cholesterol: 182 mg/dL (ref 0–200)
HDL: 33 mg/dL — ABNORMAL LOW (ref >39)
LDL Cholesterol: 83 mg/dL (ref 0–99)
Total CHOL/HDL Ratio: 5.5 RATIO
Triglycerides: 329 mg/dL — ABNORMAL HIGH (ref <150)
VLDL: 66 mg/dL — ABNORMAL HIGH (ref 0–40)

## 2012-11-17 LAB — COMPREHENSIVE METABOLIC PANEL
ALT: 16 U/L (ref 0–53)
AST: 18 U/L (ref 0–37)
Albumin: 3.4 g/dL — ABNORMAL LOW (ref 3.5–5.2)
Alkaline Phosphatase: 85 U/L (ref 39–117)
BUN: 15 mg/dL (ref 6–23)
CO2: 29 mEq/L (ref 19–32)
Calcium: 8.8 mg/dL (ref 8.4–10.5)
Chloride: 102 mEq/L (ref 96–112)
Creatinine, Ser: 1.34 mg/dL (ref 0.50–1.35)
GFR calc Af Amer: 67 mL/min — ABNORMAL LOW (ref >90)
GFR calc non Af Amer: 58 mL/min — ABNORMAL LOW (ref >90)
Glucose, Bld: 100 mg/dL — ABNORMAL HIGH (ref 70–99)
Potassium: 3.8 mEq/L (ref 3.5–5.1)
Sodium: 140 mEq/L (ref 135–145)
Total Bilirubin: 0.3 mg/dL (ref 0.3–1.2)
Total Protein: 6.6 g/dL (ref 6.0–8.3)

## 2012-11-17 LAB — HEMOGLOBIN A1C
Hgb A1c MFr Bld: 5.6 % (ref <5.7)
Mean Plasma Glucose: 114 mg/dL (ref <117)

## 2012-11-17 LAB — GLUCOSE, CAPILLARY
Glucose-Capillary: 95 mg/dL (ref 70–99)
Glucose-Capillary: 92 mg/dL (ref 70–99)

## 2012-11-17 MED ORDER — DOXAZOSIN MESYLATE 4 MG PO TABS: 4.0000 mg | ORAL_TABLET | Freq: Every day | ORAL | Status: DC

## 2012-11-17 MED ORDER — ACETAMINOPHEN 325 MG PO TABS: 650.0000 mg | ORAL_TABLET | Freq: Four times a day (QID) | ORAL | Status: DC | PRN

## 2012-11-17 MED ORDER — SODIUM CHLORIDE 0.65 % NA SOLN: 2.0000 | NASAL | Status: DC | PRN

## 2012-11-17 MED ORDER — ATORVASTATIN CALCIUM 10 MG PO TABS: 10.0000 mg | ORAL_TABLET | Freq: Every day | ORAL | Status: DC

## 2012-11-17 MED ORDER — ASPIRIN 325 MG PO TABS: 325.0000 mg | ORAL_TABLET | Freq: Every day | ORAL | Status: DC

## 2012-11-17 MED ORDER — ZOLPIDEM TARTRATE 10 MG PO TABS: ORAL_TABLET | ORAL | Status: DC

## 2012-11-17 MED ORDER — DILTIAZEM HCL ER COATED BEADS 360 MG PO CP24
360.0000 mg | ORAL_CAPSULE | Freq: Every day | ORAL | Status: DC
  Administered 2012-11-17: 360 mg via ORAL
  Filled 2012-11-17: qty 1

## 2012-11-17 NOTE — Progress Notes (Signed)
Subjective: Admitted yesterday with L sided weakness, facial droop, slurred speech and MS changes - Original CCT was + but subsequent testing was (-).  I wonder if all his Sxs could be explained from Medication SEs of using Both Ambien and Benedryl.  He is back at baseline this am.  No new C/O.  Objective: Vital signs in last 24 hours: Temp:  [97.5 F (36.4 C)-98.4 F (36.9 C)] 97.9 F (36.6 C) (03/14 0455) Pulse Rate:  [66-77] 70 (03/14 0455) Resp:  [18-21] 18 (03/14 0455) BP: (114-160)/(58-103) 155/96 mmHg (03/14 0455) SpO2:  [90 %-99 %] 96 % (03/14 0455) Weight:  [113.7 kg (250 lb 10.6 oz)] 113.7 kg (250 lb 10.6 oz) (03/13 1114) Weight change:  Last BM Date: 11/15/12  CBG (last 3)  No results found for this basename: GLUCAP,  in the last 72 hours  Intake/Output from previous day: No intake or output data in the 24 hours ending 11/17/12 0721     Physical Exam General appearance: A and O - at baselineEyes: no scleral icterus Throat: oropharynx moist without erythema.  Neck - Trach Scar Speech clear.  No Facial droop.  Tongue Midline.  CN 2-12 intact Resp: CTA Cardio: Reg GI: soft, non-tender; bowel sounds normal; no masses,  no organomegaly. Obese.  Scars Extremities: no clubbing, cyanosis or edema Neuro - intact   Lab Results:  Recent Labs  11/16/12 0852 11/17/12 0458  NA 143 140  K 4.0 3.8  CL 107 102  CO2  --  29  GLUCOSE 98 100*  BUN 20 15  CREATININE 1.50* 1.34  CALCIUM  --  8.8     Recent Labs  11/17/12 0458  AST 18  ALT 16  ALKPHOS 85  BILITOT 0.3  PROT 6.6  ALBUMIN 3.4*     Recent Labs  11/16/12 0804 11/16/12 0852  WBC 5.8  --   HGB 13.6 13.6  HCT 39.9 40.0  MCV 84.5  --   PLT 201  --     Lab Results  Component Value Date   INR 1.02 11/16/2012   INR 1.10 03/02/2010   INR 1.0 04/27/2009    No results found for this basename: CKTOTAL, CKMB, CKMBINDEX, TROPONINI,  in the last 72 hours  No results found for this basename: TSH,  T4TOTAL, FREET3, T3FREE, THYROIDAB,  in the last 72 hours  No results found for this basename: VITAMINB12, FOLATE, FERRITIN, TIBC, IRON, RETICCTPCT,  in the last 72 hours  Micro Results: No results found for this or any previous visit (from the past 240 hour(s)).   Studies/Results: Dg Chest 2 View  11/16/2012  *RADIOLOGY REPORT*  Clinical Data: Mental status changes, weakness.  CHEST - 2 VIEW  Comparison: 02/01/2011  Findings: Cardiomegaly.  Mild vascular congestion.  No confluent airspace opacities or effusions.  No overt edema.  No acute bony abnormality.  IMPRESSION: Cardiomegaly, mild vascular congestion.   Original Report Authenticated By: Charlett Nose, M.D.    Ct Head Wo Contrast  11/16/2012  *RADIOLOGY REPORT*  Clinical Data: Elevated blood pressure.  Slurred speech. Lethargic.  CT HEAD WITHOUT CONTRAST  Technique:  Contiguous axial images were obtained from the base of the skull through the vertex without contrast.  Comparison: None.  Findings: Question left middle cerebral artery distribution acute infarct with slight indistinctness of gray-white differentiation and slightly hyperdense left middle cerebral artery.  Small vessel disease type changes.  No intracranial hemorrhage.  No intracranial mass lesion detected on this unenhanced exam.  No  hydrocephalus.  Visualized sinuses, mastoid air cells middle ear cavities are clear.  IMPRESSION: Question acute left middle cerebral artery distribution infarct.  Please see above.  This has been made a PRA call report utilizing dashboard call feature.   Original Report Authenticated By: Lacy Duverney, M.D.    Mr [facility] Wo Contrast  11/16/2012  *RADIOLOGY REPORT*  Clinical Data:  Slurred speech.  Confusion.  Elevated blood pressure.  History pancreatitis, high blood pressure, hyperlipidemic and sarcoidosis.  MRI BRAIN WITHOUT CONTRAST MRA HEAD WITHOUT CONTRAST  Technique: Multiplanar, multiecho pulse sequences of the brain and surrounding structures  were obtained according to standard protocol without intravenous contrast.  Angiographic images of the head were obtained using MRA technique without contrast.  Comparison: 11/16/2012 CT.  No comparison MR.  MRI HEAD  Findings:  Motion degraded exam.  No definitive evidence of an acute infarct.  Subtle altered signal intensity left paracentral mid brain and pons on diffusion imaging.  This is below the threshold allowing diagnosis of an acute infarct.  If there are progressive symptoms referable to this region, follow-up thin section diffusion weighted imaging can be performed.  No intracranial hemorrhage.  No intracranial mass lesion detected on this unenhanced exam.  Nonspecific white matter type changes most notable periventricular region most suggestive of result of small vessel disease.  No hydrocephalus.  Major intracranial vascular structures are patent.  Minimal paranasal sinus mucosal thickening.  Partially empty sella.  This may be an incidental finding although also described in patients with pseudotumor cerebri.  Mild spinal stenosis C3-4.  Cervical medullary junction and pineal region unremarkable.  Mild exophthalmos.  IMPRESSION: Motion degraded exam.  No definitive evidence of an acute infarct.  Please see above.  MRA HEAD  Findings: Motion degraded exam. Grading stenosis or evaluating for aneurysm is therefore limited.  Ectatic left internal carotid artery.  Irregular small right anterior cerebral artery most notable involving the A1 segment.  Ectatic A2 segment of the anterior cerebral artery.  Ectatic vertebral arteries and basilar artery.  Mild narrowing mid aspect of the basilar artery.  Probable artifact causing appearance of narrowing of the vertebral arteries.  Nonvisualization AICAs.  Branch vessel irregularity.  IMPRESSION: Motion degraded examination limits evaluation for grading stenosis accurately or evaluating for aneurysm.  Ectatic left internal carotid artery.  Narrowed irregular right  anterior cerebral artery.  Ectatic A2 segment anterior cerebral artery.  Branch vessel irregularity.  Mild narrowing mid aspect basilar artery.   Original Report Authenticated By: Lacy Duverney, M.D.    Mr Brain Wo Contrast  11/16/2012  *RADIOLOGY REPORT*  Clinical Data:  Slurred speech.  Confusion.  Elevated blood pressure.  History pancreatitis, high blood pressure, hyperlipidemic and sarcoidosis.  MRI BRAIN WITHOUT CONTRAST MRA HEAD WITHOUT CONTRAST  Technique: Multiplanar, multiecho pulse sequences of the brain and surrounding structures were obtained according to standard protocol without intravenous contrast.  Angiographic images of the head were obtained using MRA technique without contrast.  Comparison: 11/16/2012 CT.  No comparison MR.  MRI HEAD  Findings:  Motion degraded exam.  No definitive evidence of an acute infarct.  Subtle altered signal intensity left paracentral mid brain and pons on diffusion imaging.  This is below the threshold allowing diagnosis of an acute infarct.  If there are progressive symptoms referable to this region, follow-up thin section diffusion weighted imaging can be performed.  No intracranial hemorrhage.  No intracranial mass lesion detected on this unenhanced exam.  Nonspecific white matter type changes most notable periventricular  region most suggestive of result of small vessel disease.  No hydrocephalus.  Major intracranial vascular structures are patent.  Minimal paranasal sinus mucosal thickening.  Partially empty sella.  This may be an incidental finding although also described in patients with pseudotumor cerebri.  Mild spinal stenosis C3-4.  Cervical medullary junction and pineal region unremarkable.  Mild exophthalmos.  IMPRESSION: Motion degraded exam.  No definitive evidence of an acute infarct.  Please see above.  MRA HEAD  Findings: Motion degraded exam. Grading stenosis or evaluating for aneurysm is therefore limited.  Ectatic left internal carotid artery.   Irregular small right anterior cerebral artery most notable involving the A1 segment.  Ectatic A2 segment of the anterior cerebral artery.  Ectatic vertebral arteries and basilar artery.  Mild narrowing mid aspect of the basilar artery.  Probable artifact causing appearance of narrowing of the vertebral arteries.  Nonvisualization AICAs.  Branch vessel irregularity.  IMPRESSION: Motion degraded examination limits evaluation for grading stenosis accurately or evaluating for aneurysm.  Ectatic left internal carotid artery.  Narrowed irregular right anterior cerebral artery.  Ectatic A2 segment anterior cerebral artery.  Branch vessel irregularity.  Mild narrowing mid aspect basilar artery.   Original Report Authenticated By: Lacy Duverney, M.D.      Medications: Scheduled: . aspirin  300 mg Rectal Daily   Or  . aspirin  325 mg Oral Daily  . diltiazem  360 mg Oral Daily  . doxazosin  1 mg Oral QHS  . enalapril  5 mg Oral Daily  . enoxaparin (LOVENOX) injection  40 mg Subcutaneous Q24H  . irbesartan  150 mg Oral Daily  . labetalol  300 mg Oral TID  . predniSONE  5 mg Oral Q breakfast  . zolpidem  10 mg Oral QHS   Continuous:    Assessment/Plan: Active Problems:   * No active hospital problems. *  Clinical CVA with + CCT, (-) MRI/MRA (-) but motion Degraded exam, Carotid Duplex (Doppler) Preliminary findings: Bilateral: No hemodynamically significant ICA stenosis. Vertebral artery flow is antegrade. 2D ECHO Left ventricle: The cavity size was normal. Wall thickness was increased in a pattern of moderate LVH. Systolic function was normal. The estimated ejection fraction was in the range of 55% to 60%. Wall motion was normal; there were no regional wall motion abnormalities. Doppler parameters are consistent with abnormal left ventricular relaxation (grade 1 diastolic dysfunction). - Left atrium: The atrium was mildly dilated.  He has remained sinus rhythm.  He is to reduce Ambien to 5 mg and not  take benedryl.  He is to quit smoking immediately.  He is to get back on ASA.  He is to take his outpt meds and follow up with me more often.  He can go home today.  He is instructed to work on a better lifestyle.  HTN - Will address as outpatient and adjust to <135/85  LDL is currently 83 - Will start Atorva 10 b/c he is high risk  For sinus congestion - OK for Nasal Saline +/- Flonase/Nasonex.  Insomnia - Lower Ambien to 5 HS prn as outpt.  A1C is P.  OK to D/C     LOS: 1 day   RUSSO,JOHN M 11/17/2012, 7:21 AM

## 2012-11-17 NOTE — Discharge Summary (Signed)
Physician Discharge Summary  DISCHARGE SUMMARY   Patient ID: [name] MR#: [id number] DOB/AGE: [birth year] 56 y.o.   Attending Physician:RUSSO,JOHN M  Patient's [name],[name], MD  Consults:  Neuro Hospitalist  Admit date: 11/16/2012 Discharge date: 11/17/2012  Discharge Diagnoses:  Active Problems:   * No active hospital problems. *   Patient Active Problem List  Diagnosis  . SHOULDER PAIN, LEFT   Past Medical History  Diagnosis Date  . Pancreatitis   . Hypertension   . Sarcoidosis   . Hyperlipidemia     Discharged Condition: stable   Discharge Medications:   Medication List    TAKE these medications       acetaminophen 325 MG tablet  Commonly known as:  TYLENOL  Take 2 tablets (650 mg total) by mouth every 6 (six) hours as needed for pain or fever.     aspirin 325 MG tablet  Take 1 tablet (325 mg total) by mouth daily.     atorvastatin 10 MG tablet  Commonly known as:  LIPITOR  Take 1 tablet (10 mg total) by mouth daily.     diltiazem 360 MG 24 hr capsule  Commonly known as:  TIAZAC  TAKE 1 CAPSULE BY MOUTH ONCE DAILY     doxazosin 4 MG tablet  Commonly known as:  CARDURA  Take 1 tablet (4 mg total) by mouth at bedtime.     enalapril 5 MG tablet  Commonly known as:  VASOTEC  Take 5 mg by mouth daily.     labetalol 300 MG tablet  Commonly known as:  NORMODYNE  Take 300 mg by mouth 3 (three) times daily.     olmesartan 40 MG tablet  Commonly known as:  BENICAR  Take 40 mg by mouth daily.     predniSONE 5 MG tablet  Commonly known as:  DELTASONE  Take 5 mg by mouth daily.     sodium chloride 0.65 % nasal spray  Commonly known as:  OCEAN  Place 2 sprays into the nose as needed for congestion.     zolpidem 10 MG tablet  Commonly known as:  AMBIEN  Take 1/2 tablet (5 mg total) by mouth at bedtime. Do not take antihistamines with this medicine        Hospital Procedures: Dg Chest 2 View  11/16/2012  *RADIOLOGY REPORT*   Clinical Data: Mental status changes, weakness.  CHEST - 2 VIEW  Comparison: 02/01/2011  Findings: Cardiomegaly.  Mild vascular congestion.  No confluent airspace opacities or effusions.  No overt edema.  No acute bony abnormality.  IMPRESSION: Cardiomegaly, mild vascular congestion.   Original Report Authenticated By: Charlett Nose, M.D.    Ct Head Wo Contrast  11/16/2012  *RADIOLOGY REPORT*  Clinical Data: Elevated blood pressure.  Slurred speech. Lethargic.  CT HEAD WITHOUT CONTRAST  Technique:  Contiguous axial images were obtained from the base of the skull through the vertex without contrast.  Comparison: None.  Findings: Question left middle cerebral artery distribution acute infarct with slight indistinctness of gray-white differentiation and slightly hyperdense left middle cerebral artery.  Small vessel disease type changes.  No intracranial hemorrhage.  No intracranial mass lesion detected on this unenhanced exam.  No hydrocephalus.  Visualized sinuses, mastoid air cells middle ear cavities are clear.  IMPRESSION: Question acute left middle cerebral artery distribution infarct.  Please see above.  This has been made a PRA call report utilizing dashboard call feature.   Original Report Authenticated By: Lacy Duverney, M.D.  Mr [name] Head Wo Contrast  11/16/2012  *RADIOLOGY REPORT*  Clinical Data:  Slurred speech.  Confusion.  Elevated blood pressure.  History pancreatitis, high blood pressure, hyperlipidemic and sarcoidosis.  MRI BRAIN WITHOUT CONTRAST MRA HEAD WITHOUT CONTRAST  Technique: Multiplanar, multiecho pulse sequences of the brain and surrounding structures were obtained according to standard protocol without intravenous contrast.  Angiographic images of the head were obtained using MRA technique without contrast.  Comparison: 11/16/2012 CT.  No comparison MR.  MRI HEAD  Findings:  Motion degraded exam.  No definitive evidence of an acute infarct.  Subtle altered signal intensity left  paracentral mid brain and pons on diffusion imaging.  This is below the threshold allowing diagnosis of an acute infarct.  If there are progressive symptoms referable to this region, follow-up thin section diffusion weighted imaging can be performed.  No intracranial hemorrhage.  No intracranial mass lesion detected on this unenhanced exam.  Nonspecific white matter type changes most notable periventricular region most suggestive of result of small vessel disease.  No hydrocephalus.  Major intracranial vascular structures are patent.  Minimal paranasal sinus mucosal thickening.  Partially empty sella.  This may be an incidental finding although also described in patients with pseudotumor cerebri.  Mild spinal stenosis C3-4.  Cervical medullary junction and pineal region unremarkable.  Mild exophthalmos.  IMPRESSION: Motion degraded exam.  No definitive evidence of an acute infarct.  Please see above.  MRA HEAD  Findings: Motion degraded exam. Grading stenosis or evaluating for aneurysm is therefore limited.  Ectatic left internal carotid artery.  Irregular small right anterior cerebral artery most notable involving the A1 segment.  Ectatic A2 segment of the anterior cerebral artery.  Ectatic vertebral arteries and basilar artery.  Mild narrowing mid aspect of the basilar artery.  Probable artifact causing appearance of narrowing of the vertebral arteries.  Nonvisualization AICAs.  Branch vessel irregularity.  IMPRESSION: Motion degraded examination limits evaluation for grading stenosis accurately or evaluating for aneurysm.  Ectatic left internal carotid artery.  Narrowed irregular right anterior cerebral artery.  Ectatic A2 segment anterior cerebral artery.  Branch vessel irregularity.  Mild narrowing mid aspect basilar artery.   Original Report Authenticated By: Lacy Duverney, M.D.    Mr Brain Wo Contrast  11/16/2012  *RADIOLOGY REPORT*  Clinical Data:  Slurred speech.  Confusion.  Elevated blood pressure.   History pancreatitis, high blood pressure, hyperlipidemic and sarcoidosis.  MRI BRAIN WITHOUT CONTRAST MRA HEAD WITHOUT CONTRAST  Technique: Multiplanar, multiecho pulse sequences of the brain and surrounding structures were obtained according to standard protocol without intravenous contrast.  Angiographic images of the head were obtained using MRA technique without contrast.  Comparison: 11/16/2012 CT.  No comparison MR.  MRI HEAD  Findings:  Motion degraded exam.  No definitive evidence of an acute infarct.  Subtle altered signal intensity left paracentral mid brain and pons on diffusion imaging.  This is below the threshold allowing diagnosis of an acute infarct.  If there are progressive symptoms referable to this region, follow-up thin section diffusion weighted imaging can be performed.  No intracranial hemorrhage.  No intracranial mass lesion detected on this unenhanced exam.  Nonspecific white matter type changes most notable periventricular region most suggestive of result of small vessel disease.  No hydrocephalus.  Major intracranial vascular structures are patent.  Minimal paranasal sinus mucosal thickening.  Partially empty sella.  This may be an incidental finding although also described in patients with pseudotumor cerebri.  Mild spinal stenosis C3-4.  Cervical  medullary junction and pineal region unremarkable.  Mild exophthalmos.  IMPRESSION: Motion degraded exam.  No definitive evidence of an acute infarct.  Please see above.  MRA HEAD  Findings: Motion degraded exam. Grading stenosis or evaluating for aneurysm is therefore limited.  Ectatic left internal carotid artery.  Irregular small right anterior cerebral artery most notable involving the A1 segment.  Ectatic A2 segment of the anterior cerebral artery.  Ectatic vertebral arteries and basilar artery.  Mild narrowing mid aspect of the basilar artery.  Probable artifact causing appearance of narrowing of the vertebral arteries.  Nonvisualization  AICAs.  Branch vessel irregularity.  IMPRESSION: Motion degraded examination limits evaluation for grading stenosis accurately or evaluating for aneurysm.  Ectatic left internal carotid artery.  Narrowed irregular right anterior cerebral artery.  Ectatic A2 segment anterior cerebral artery.  Branch vessel irregularity.  Mild narrowing mid aspect basilar artery.   Original Report Authenticated By: Lacy Duverney, M.D.     History of Present Illness: 64 Male with Sarcoidosis, HTN, Hyperlipidemia. 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 Nephrocalcinosis, Severe Gallstone Pancreatitis 7/02-S/P Laprascopic Cholecystectomy 12/02-Tracheostomy.  H/O Recurrent Retroperitoneal Abscesses. Osteopenia, CKD 3 c baseline Cr 1.6 c Macroalbuminuria/Proteinuria and ?FSGS who was last seen by me in 11/2011 and missed his 05/2012 appt. He is maintained on 5mg  Prednisone for the Sarcoidosis and Hypercalcemia. At times he has had wildly fluctuating BPs and I co-manage his BP and CKD with Dr Lowell Guitar from Nephrology. The Pt was in his usual state of health when he went to bed the night prior to admit. He took his Ambien and apparently took some benedryl as well. Yesterday am his wife noticed Slurred speech, facial droop, L sided weakness, confusion and mental slowing. She brought him to the ED. BP at home the night prior to admit was 130/80-90 and yesterday am 160/110. In the ED CCT showed possible CVA and I was called for inpt admit. Neuro consulted. I saw him in the ED and facial droop was gone, weakness had resolved. He was still slow and had slurred speech. It was almost as though he was overmedicated.  We admitted for eval and Rx. Also I had been unaware that he picked smoking back up  He states one month ago and has been doing 1/2 PPD - Needs to quit.  He also apparently got 1 mg Ativan which clearly made his MS Worse.   Hospital Course: Admitted yesterday with L sided weakness, facial droop, slurred speech and MS  changes=confusion and mental slowing - Original CCT was + but subsequent testing was (-). I wonder if all his Sxs could be explained from Medication SEs of using Both Ambien and Benedryl. He is back at baseline this am.  No new C/O.  Clinical CVA with + CCT, (-) MRI/MRA (-) but motion Degraded exam, Carotid Duplex (Doppler) Preliminary findings: Bilateral: No hemodynamically significant ICA stenosis. Vertebral artery flow is antegrade. 2D ECHO Left ventricle: The cavity size was normal. Wall thickness was increased in a pattern of moderate LVH. Systolic function was normal. The estimated ejection fraction was in the range of 55% to 60%. Wall motion was normal; there were no regional wall motion abnormalities. Doppler parameters are consistent with abnormal left ventricular relaxation (grade 1 diastolic dysfunction). - Left atrium: The atrium was mildly dilated. He has remained sinus rhythm. He is to reduce Ambien to 5 mg and not take benedryl. He is to quit smoking immediately. He is to get back on ASA. He is to take his  outpt meds and follow up with me more often. He can go home today. He is instructed to work on a better lifestyle.  HTN - Will address as outpatient and adjust to <135/85. Lasix and NTG SL prn have disappeared from list.  Will address as outpt.  Hyperlipidemia - We had him on Simvastatin and he took himself off of it @ 1 yr ago. In 11/2011 LDL was 106. Current LDL is 83 - Will start Atorva 10 b/c he is high risk  For sinus congestion - OK for Nasal Saline +/- Flonase/Nasonex. No Antihistamines. 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 Lower Ambien to 5 HS prn as outpt.   1/2 PPD Smoker - It is stupid for someone with all his CV Risk factors, Fm H/O Lung cancer and known Sarcoid to be smoking. He needs to quit.  HTN - continue home meds. Outpt goal <135/85. Monitor here without overdoing it.  CKD 3 c baseline Cr 1.6 c Macroalbuminuria/Proteinuria and ?FSGS - managed with  BP control by me and Dr Lanier Clam. Cr 1.34-1.5 here in hospital  Obesity with BMI 34+ - Needs weight loss.  Allergies - Hold Antihistamines OK for nasal Saline and ? Nasal steroids if needed. IFG - Last A1C 5.5% 11/2011. Current A1C is P.  OA - Per Dr Jannette Spanner - No NSAIDs.  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   OK for D/C as he is at baseline and I witness him walking fine in his room.  He has remained NSR on Tele.     Day of Discharge Exam BP 155/96  Pulse 70  Temp(Src) 97.9 F (36.6 C) (Oral)  Resp 18  Ht 5' 10.87" (1.8 m)  Wt 113.7 kg (250 lb 10.6 oz)  BMI 35.09 kg/m2  SpO2 96%  Physical Exam: See PN  Discharge Labs:  Recent Labs  11/16/12 0852 11/17/12 0458  NA 143 140  K 4.0 3.8  CL 107 102  CO2  --  29  GLUCOSE 98 100*  BUN 20 15  CREATININE 1.50* 1.34  CALCIUM  --  8.8    Recent Labs  11/17/12 0458  AST 18  ALT 16  ALKPHOS 85  BILITOT 0.3  PROT 6.6  ALBUMIN 3.4*    Recent Labs  11/16/12 0804 11/16/12 0852  WBC 5.8  --   HGB 13.6 13.6  HCT 39.9 40.0  MCV 84.5  --   PLT 201  --    No results found for this basename: CKTOTAL, CKMB, CKMBINDEX, TROPONINI,  in the last 72 hours No results found for this basename: TSH, T4TOTAL, FREET3, T3FREE, THYROIDAB,  in the last 72 hours No results found for this basename: VITAMINB12, FOLATE, FERRITIN, TIBC, IRON, RETICCTPCT,  in the last 72 hours Lab Results  Component Value Date   INR 1.02 11/16/2012   INR 1.10 03/02/2010   INR 1.0 04/27/2009       Discharge instructions:  01-Home or Self Care Follow-up Information   Follow up with Gwen Pounds, MD In 2 weeks.   Contact information:   [address] [facility] [place] [address] [phone]        Disposition: home  Follow-up Appts: Follow-up with Dr. Timothy Lasso at [facility] in 2 weeks.  Call for appointment.  Condition on Discharge:  stable  Tests Needing Follow-up: None  Time spent in discharge (includes decision making & examination of pt): 40 min  Signed: Gwen Pounds 11/17/2012, 7:38 AM

## 2013-01-30 ENCOUNTER — Ambulatory Visit
Admission: RE | Admit: 2013-01-30 | Discharge: 2013-01-30 | Disposition: A | Discharge status: Home / Self Care | Payer: BC Managed Care – PPO | Source: Ambulatory Visit | Attending: Internal Medicine | Admitting: Internal Medicine

## 2013-01-30 ENCOUNTER — Encounter (HOSPITAL_COMMUNITY): Payer: Self-pay | Admitting: Internal Medicine

## 2013-01-30 ENCOUNTER — Other Ambulatory Visit: Payer: Self-pay | Admitting: Internal Medicine

## 2013-01-30 ENCOUNTER — Inpatient Hospital Stay (HOSPITAL_COMMUNITY)
Admission: AD | Admit: 2013-01-30 | Discharge: 2013-02-01 | DRG: 078 | Disposition: A | Discharge status: Home / Self Care | Payer: BC Managed Care – PPO | Source: Ambulatory Visit | Attending: Internal Medicine | Admitting: Internal Medicine

## 2013-01-30 DIAGNOSIS — D869 Sarcoidosis, unspecified: Secondary | ICD-10-CM | POA: Diagnosis present

## 2013-01-30 DIAGNOSIS — E669 Obesity, unspecified: Secondary | ICD-10-CM | POA: Diagnosis present

## 2013-01-30 DIAGNOSIS — R7301 Impaired fasting glucose: Secondary | ICD-10-CM | POA: Diagnosis present

## 2013-01-30 DIAGNOSIS — I2699 Other pulmonary embolism without acute cor pulmonale: Principal | ICD-10-CM | POA: Diagnosis present

## 2013-01-30 DIAGNOSIS — M1990 Unspecified osteoarthritis, unspecified site: Secondary | ICD-10-CM | POA: Diagnosis present

## 2013-01-30 DIAGNOSIS — E876 Hypokalemia: Secondary | ICD-10-CM | POA: Diagnosis present

## 2013-01-30 DIAGNOSIS — Z6834 Body mass index (BMI) 34.0-34.9, adult: Secondary | ICD-10-CM

## 2013-01-30 DIAGNOSIS — R042 Hemoptysis: Secondary | ICD-10-CM

## 2013-01-30 DIAGNOSIS — I129 Hypertensive chronic kidney disease with stage 1 through stage 4 chronic kidney disease, or unspecified chronic kidney disease: Secondary | ICD-10-CM | POA: Diagnosis present

## 2013-01-30 DIAGNOSIS — E785 Hyperlipidemia, unspecified: Secondary | ICD-10-CM | POA: Diagnosis present

## 2013-01-30 DIAGNOSIS — N183 Chronic kidney disease, stage 3 unspecified: Secondary | ICD-10-CM | POA: Diagnosis present

## 2013-01-30 DIAGNOSIS — Z87891 Personal history of nicotine dependence: Secondary | ICD-10-CM

## 2013-01-30 HISTORY — DX: Other pulmonary embolism without acute cor pulmonale: I26.99

## 2013-01-30 LAB — PROTIME-INR
INR: 1.07 (ref 0.00–1.49)
Prothrombin Time: 13.8 seconds (ref 11.6–15.2)

## 2013-01-30 LAB — COMPREHENSIVE METABOLIC PANEL
ALT: 61 U/L — ABNORMAL HIGH (ref 0–53)
AST: 42 U/L — ABNORMAL HIGH (ref 0–37)
Albumin: 3.5 g/dL (ref 3.5–5.2)
Alkaline Phosphatase: 91 U/L (ref 39–117)
BUN: 14 mg/dL (ref 6–23)
CO2: 26 mEq/L (ref 19–32)
Calcium: 9.3 mg/dL (ref 8.4–10.5)
Chloride: 102 mEq/L (ref 96–112)
Creatinine, Ser: 1.33 mg/dL (ref 0.50–1.35)
GFR calc Af Amer: 68 mL/min — ABNORMAL LOW (ref >90)
GFR calc non Af Amer: 58 mL/min — ABNORMAL LOW (ref >90)
Glucose, Bld: 156 mg/dL — ABNORMAL HIGH (ref 70–99)
Potassium: 3.3 mEq/L — ABNORMAL LOW (ref 3.5–5.1)
Sodium: 138 mEq/L (ref 135–145)
Total Bilirubin: 0.3 mg/dL (ref 0.3–1.2)
Total Protein: 6.9 g/dL (ref 6.0–8.3)

## 2013-01-30 LAB — CBC
HCT: 40.7 % (ref 39.0–52.0)
Hemoglobin: 14.1 g/dL (ref 13.0–17.0)
MCH: 29.2 pg (ref 26.0–34.0)
MCHC: 34.6 g/dL (ref 30.0–36.0)
MCV: 84.3 fL (ref 78.0–100.0)
Platelets: 217 10*3/uL (ref 150–400)
RBC: 4.83 MIL/uL (ref 4.22–5.81)
RDW: 14.4 % (ref 11.5–15.5)
WBC: 9.3 10*3/uL (ref 4.0–10.5)

## 2013-01-30 MED ORDER — SODIUM CHLORIDE 0.9 % IJ SOLN
3.0000 mL | Freq: Two times a day (BID) | INTRAMUSCULAR | Status: DC
  Administered 2013-01-30: 3 mL via INTRAVENOUS

## 2013-01-30 MED ORDER — ACETAMINOPHEN 650 MG RE SUPP: 650.0000 mg | Freq: Four times a day (QID) | RECTAL | Status: DC | PRN

## 2013-01-30 MED ORDER — IOHEXOL 350 MG/ML SOLN
125.0000 mL | Freq: Once | INTRAVENOUS | Status: AC | PRN
  Administered 2013-01-30: 125 mL via INTRAVENOUS

## 2013-01-30 MED ORDER — SODIUM CHLORIDE 0.9 % IV SOLN: 250.0000 mL | INTRAVENOUS | Status: DC | PRN

## 2013-01-30 MED ORDER — DOXAZOSIN MESYLATE 4 MG PO TABS
4.0000 mg | ORAL_TABLET | Freq: Every day | ORAL | Status: DC
  Administered 2013-01-30 – 2013-01-31 (×2): 4 mg via ORAL
  Filled 2013-01-30 (×3): qty 1

## 2013-01-30 MED ORDER — LABETALOL HCL 300 MG PO TABS
300.0000 mg | ORAL_TABLET | Freq: Three times a day (TID) | ORAL | Status: DC
  Administered 2013-01-30 – 2013-02-01 (×5): 300 mg via ORAL
  Filled 2013-01-30 (×7): qty 1

## 2013-01-30 MED ORDER — ACETAMINOPHEN 325 MG PO TABS: 650.0000 mg | ORAL_TABLET | Freq: Four times a day (QID) | ORAL | Status: DC | PRN

## 2013-01-30 MED ORDER — IRBESARTAN 300 MG PO TABS
300.0000 mg | ORAL_TABLET | Freq: Every day | ORAL | Status: DC
  Administered 2013-01-31 – 2013-02-01 (×2): 300 mg via ORAL
  Filled 2013-01-30 (×2): qty 1

## 2013-01-30 MED ORDER — DILTIAZEM HCL ER COATED BEADS 300 MG PO CP24
300.0000 mg | ORAL_CAPSULE | Freq: Every day | ORAL | Status: DC
Start: 2013-01-31 — End: 2013-02-01
  Administered 2013-01-31 – 2013-02-01 (×2): 300 mg via ORAL
  Filled 2013-01-30 (×2): qty 1

## 2013-01-30 MED ORDER — DILTIAZEM HCL ER BEADS 300 MG PO CP24: 300.0000 mg | ORAL_CAPSULE | Freq: Every day | ORAL | Status: DC

## 2013-01-30 MED ORDER — ATORVASTATIN CALCIUM 10 MG PO TABS
10.0000 mg | ORAL_TABLET | Freq: Every day | ORAL | Status: DC
  Administered 2013-01-31 – 2013-02-01 (×2): 10 mg via ORAL
  Filled 2013-01-30 (×2): qty 1

## 2013-01-30 MED ORDER — ENALAPRIL MALEATE 5 MG PO TABS
5.0000 mg | ORAL_TABLET | Freq: Every day | ORAL | Status: DC
  Filled 2013-01-30: qty 1

## 2013-01-30 MED ORDER — SALINE SPRAY 0.65 % NA SOLN
2.0000 | NASAL | Status: DC | PRN
  Filled 2013-01-30: qty 44

## 2013-01-30 MED ORDER — SODIUM CHLORIDE 0.9 % IJ SOLN: 3.0000 mL | INTRAMUSCULAR | Status: DC | PRN

## 2013-01-30 MED ORDER — SODIUM CHLORIDE 0.65 % NA SOLN: 2.0000 | NASAL | Status: DC | PRN

## 2013-01-30 MED ORDER — HEPARIN (PORCINE) IN NACL 100-0.45 UNIT/ML-% IJ SOLN
1200.0000 [IU]/h | INTRAMUSCULAR | Status: AC
  Administered 2013-01-30: 1650 [IU]/h via INTRAVENOUS
  Administered 2013-01-31 – 2013-02-01 (×2): 1200 [IU]/h via INTRAVENOUS
  Filled 2013-01-30 (×4): qty 250

## 2013-01-30 MED ORDER — ZOLPIDEM TARTRATE 5 MG PO TABS
10.0000 mg | ORAL_TABLET | Freq: Every evening | ORAL | Status: DC | PRN
  Administered 2013-01-31 (×2): 10 mg via ORAL
  Filled 2013-01-30 (×2): qty 2

## 2013-01-30 MED ORDER — SODIUM CHLORIDE 0.9 % IJ SOLN: 3.0000 mL | Freq: Two times a day (BID) | INTRAMUSCULAR | Status: DC

## 2013-01-30 MED ORDER — ACETAMINOPHEN 325 MG PO TABS
650.0000 mg | ORAL_TABLET | Freq: Four times a day (QID) | ORAL | Status: DC | PRN
  Administered 2013-01-31: 650 mg via ORAL
  Filled 2013-01-30: qty 2

## 2013-01-30 MED ORDER — PREDNISONE 5 MG PO TABS
5.0000 mg | ORAL_TABLET | Freq: Every day | ORAL | Status: DC
  Administered 2013-01-31 – 2013-02-01 (×2): 5 mg via ORAL
  Filled 2013-01-30 (×2): qty 1

## 2013-01-30 NOTE — Progress Notes (Signed)
Pt admitted to room 2001, MD(W. Buren Kos) paged and made aware.

## 2013-01-30 NOTE — Progress Notes (Signed)
ANTICOAGULATION CONSULT NOTE - Initial Consult  Pharmacy Consult for Heparin  Indication: pulmonary embolus  Allergies  Allergen Reactions  . Codeine     REACTION: Reaction not known  . Hydromorphone Other (See Comments)    hallucinations     Patient Measurements: Height: 6' (182.9 cm) Weight: 254 lb 10.1 oz (115.5 kg) IBW/kg (Calculated) : 77.6 Heparin Dosing Weight: 102.5 kg  Vital Signs: Temp: 99 F (37.2 C) (05/27 1958) Temp src: Oral (05/27 1958) BP: 169/104 mmHg (05/27 1958) Pulse Rate: 72 (05/27 1958)  Labs: Baseline PT/INR, CBC pending No results found for this basename: HGB, HCT, PLT, APTT, LABPROT, INR, HEPARINUNFRC, CREATININE, CKTOTAL, CKMB, TROPONINI,  in the last 72 hours  Estimated Creatinine Clearance: 80.8 ml/min (by C-G formula based on Cr of 1.34).   Medical History: Past Medical History  Diagnosis Date  . Pancreatitis   . Hypertension   . Sarcoidosis   . Hyperlipidemia     Medications:  Prescriptions prior to admission  Medication Sig Dispense Refill  . acetaminophen (TYLENOL) 325 MG tablet Take 2 tablets (650 mg total) by mouth every 6 (six) hours as needed for pain or fever.      Marland Kitchen aspirin 325 MG tablet Take 1 tablet (325 mg total) by mouth daily.      Marland Kitchen atorvastatin (LIPITOR) 10 MG tablet Take 1 tablet (10 mg total) by mouth daily.  30 tablet  11  . diltiazem (TIAZAC) 360 MG 24 hr capsule TAKE 1 CAPSULE BY MOUTH ONCE DAILY  30 capsule  11  . doxazosin (CARDURA) 4 MG tablet Take 1 tablet (4 mg total) by mouth at bedtime.  30 tablet  11  . enalapril (VASOTEC) 5 MG tablet Take 5 mg by mouth daily.      Marland Kitchen labetalol (NORMODYNE) 300 MG tablet Take 300 mg by mouth 3 (three) times daily.      Marland Kitchen olmesartan (BENICAR) 40 MG tablet Take 40 mg by mouth daily.      . predniSONE (DELTASONE) 5 MG tablet Take 5 mg by mouth daily.      . sodium chloride (OCEAN) 0.65 % nasal spray Place 2 sprays into the nose as needed for congestion.  15 mL  12  . zolpidem  (AMBIEN) 10 MG tablet Take 1/2 tablet (5 mg total) by mouth at bedtime. Do not take antihistamines with this medicine  30 tablet     Scheduled:  . atorvastatin  10 mg Oral Daily  . diltiazem  300 mg Oral Daily  . doxazosin  4 mg Oral QHS  . enalapril  5 mg Oral Daily  . irbesartan  300 mg Oral Daily  . labetalol  300 mg Oral TID  . predniSONE  5 mg Oral Daily  . sodium chloride  3 mL Intravenous Q12H  . sodium chloride  3 mL Intravenous Q12H   Infusions:    Assessment: 77 male here for hemoptysis. It started Friday with coughing up blood.  It darkened and improved some over the weekend. H/o pulmonary sarcoidosis.   CT angio chest today consistent with RUL segmental and subsegmental  PE w/pulmonary hemorrhage; Possible subsegmental embolus in the LUL. Patient denies melena, hematochezia and no epistaxis per Dr. Alver Fisher H&P. His risk factors for PE include recent 24 hour hospitalization in 3/14 for TIA symptoms, truck driver/sedentary lifestyle and obesity.  No active bleeding at this time per RN's report.   CBC, baseline INR pending.   Called and spoke to Dr. Clelia Croft about giving bolus  of heparin in this patient admitted with hemoptysis. Dr. Leandro Reasoner wants to hold off on the heparin bolus and just inititate with heparin drip.    Goal of Therapy:  Heparin level 0.3-0.7 units/ml Monitor platelets by anticoagulation protocol: Yes   Plan:  No bolus Start IV heparin drip at 1650 units/hr Check heparin level and CBC in 6 hours then daily AM while on IV heparin.    Noah Delaine, RPh Clinical Pharmacist Pager: [phone] 01/30/2013,9:37 PM

## 2013-01-30 NOTE — H&P (Signed)
Vital Signs  Entered weight:  255  lbs., Calculated Weight: 255 lbs., ( 115.67 kg) Height: 72 in., ( 182.88 cm) Pulse rate: 80 Pulse rhythm: regular  Blood Pressure #1: 138 / 80 mm Hg    BMI: 34.58 BSA: 2.36 Wt Chg: -3 lbs since 12/08/2012  Vitals entered by: Alvie Heidelberg CMA on Jan 30, 2013 3:29 PM  Other Procedures Completed: CXR       Risk Factors:  Tobacco use:  former smoker    Year quit:  1995 Drug use:  no HIV high-risk behavior:  no Caffeine use:  1 drinks per day Alcohol use:  no Exercise:  yes    Times per week:  1    Type:  gym Seatbelt use:  100 % Sun Exposure:  frequently  Colonoscopy History:    Date of Last Colonoscopy:  09/24/2011  History of Present Illness  Reason for visit: See chief complaint Chief Complaint: coughing up blood started friday was bright red after saturday blood was dark,  amount is getting smaller as days go along. denies CP or SOB.  History of Present Illness: 62 male here for Hemoptysis. It started Friday with coughing up Blood. It darkened and improved some over the weekend.  He is somewhat better at this time. No SOB.  No DOE. No LE Edema. Amount is getting smaller as days go along. denies CP  No bloody noses. No ST. No URI Sxs.   Review of Systems  General:       Denies fevers, chills, headache, sweats, anorexia, fatigue, malaise, weight loss.   Ears/Nose/Throat:       Denies earache, ear discharge, tinnitus, decreased hearing, nasal congestion, nosebleeds, sore throat, hoarseness, dysphagia.   Cardiovascular:       Denies chest pains, claudication, palpitations, syncope, dyspnea on exertion, orthopnea, PND, peripheral edema.   Respiratory:       Complains of hemoptysis.        Denies cough, dyspnea, excessive sputum, wheezing.   Gastrointestinal:       Complains of heartburn.        Denies nausea, vomiting, diarrhea, constipation, change in bowel habits, abdominal pain, melena, hematochezia, jaundice.    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 Pulmonary Sarcoidosis,   HTN.   Hyperlipidemia.   ADD.   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  Nephrocalcinosis,   Severe Gallstone Pancreatitis 7/02-S/P Laprascopic Cholecystectomy 12/02-Tracheostomy. H/O Recurrent Retroperitoneal Abscesses.   Osteopenia,   Small Int/Ext Hemrrhoids--Dr Magod,   Epididymitis,   CR 1.6,   Macroalbuminuria/Proteinuria.  Nephrolitiasis and stone obstruction needing extraction and stent Dr Annabell Howells 02/2010.  Kidney Bx 02/2010. Surgical History (reviewed - no changes required): S/P Laprascopic Cholecystectomy 12/02 Tracheostomy--closed Ex Lap 2005 and I&D of abscess. L Rotator Cuff Surgery 09/10/10 R sided 08/31/12 Family History (reviewed - no changes required): Father D,HTN, CVA, Lung Cancer Mother, healthy Brother, healthy Sister HTN Social History (reviewed - no changes required): Married. @ daughters. 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   Physical Exam  General appearance: well nourished, well hydrated, no acute distress  Eyes  External: conjunctivae and lids normal Pupils: equal, round, reactive to light and accommodation  Ears, Nose and Throat  External ears: normal, no lesions or deformities External nose: normal, no lesions or deformities Otoscopic: canals clear, tympanic membranes intact, no fluid Hearing: grossly intact Nasal: mucosa, septum, and turbinates normal. mildly stuffy nose. Dental: good dentition Pharynx: tongue normal,  protrudes mid line,  posterior pharynx without erythema or exudate  Neck  Neck: supple, no masses, trachea midline. Trach scar Thyroid: no nodules, masses, tenderness, or enlargement  Respiratory  Respiratory effort: no intercostal retractions or use of accessory muscles Auscultation: no rales, rhonchi, or wheezes  Cardiovascular  Palpation: no thrill or palpable murmurs, no displacement of  PMI Auscultation: S1, S2, no murmur, rub, or gallop Carotid arteries: pulses 2+, symmetric, no bruits Pedal pulses: pulses 2+, symmetric Periph. circulation: no cyanosis, clubbing, edema  Gastrointestinal  Abdomen: soft, non-tender, no masses, bowel sounds normal. Obese. Ab scar Liver and spleen: no enlargement or nodularity   Impression & Recommendations:  Problem # 1:  SARCOIDOSIS (ICD-135) (ICD10-D86.9)  Sarcoidosis/Hypercalcemia Secondary to sarcoid--(-) SPEP/UPEP - Chronic low dose prednisone has been helping.  Nothing Active CXR 11/16/12 Cardiomegaly, mild vascular congestion and repeated today - chronic changes.  Orders: CT Angiography Chest-Pulmonary Embolus W/Contrast (CTACPE) Venipuncture ([id number]) CBC/Platelets ([id number]) BMET (SMA 7) ([id number])   Problem # 2:  Hemoptysis (ICD-786.30) (ICD10-R04.2) CXR - NO Acute CP Dz - Chronic changes. Check CT Pulm Angio R/O PE vrs Pulm issues. ?Sarcoid related. no evidence of Bronchitis. I do not see and cardio/pulm issues. Min GI issues and no evidence of a GIB. No Mouth or Nasal issues. No Melena. Start with CT and consider Pulm Referral - Has seen Dr Maple Hudson.  Orders: Chest X-Ray (Pa & L) ([id number]) CT Angiography Chest-Pulmonary Embolus W/Contrast (CTACPE) Venipuncture ([id number]) CBC/Platelets ([id number]) BMET (SMA 7) ([id number])   Complete Medication List: 1)  Atorvastatin Calcium 10 Mg Tabs (Atorvastatin calcium) .... One po daily 2)  Doxazosin Mesylate 2 Mg Tabs (Doxazosin mesylate) .... Take one tablet by mouth one time daily 3)  Aspirin 81 Mg Ec Tab (Aspirin) .... Take one (1) tablet by mouth daily 4)  Nitroglycerin 0.4 Mg Subl (Nitroglycerin) .... Put on tablet under the tongue as needed for chest pain may repeat in 5 minutes 5)  Benicar 40 Mg Tabs (Olmesartan medoxomil) .... Take 1 tablet by mouth once daily 6)  Cardizem Cd 360 Mg Xr24h-cap (Diltiazem hcl coated beads) .... Take one capsule by mouth  every day 7)  Ambien 10 Mg Tabs (Zolpidem tartrate) .... One po q hs 8)  Furosemide 80 Mg Tabs (Furosemide) .Marland Kitchen.. 1 po qd 9)  Prednisone 5 Mg Tabs (Prednisone) .Marland Kitchen.. 1 po daily 10)  Labetalol Hcl 300 Mg Tabs (Labetalol hcl) .... One po tid 11)  Enalapril Maleate 5 Mg Tabs (Enalapril maleate) .Marland Kitchen.. 1 po qd   FOLLOW UP PLANS    Phone Follow up: if new symptoms develops or if symptoms worse or not better   Next office visit in : as scheduled.     Electronically signed by Gwen Pounds MD on 01/30/2013 at 3:49 PM   Addendum: I was called by radiology- CT shows: 1. Appearance consistent with right upper lobe segmental and  subsegmental pulmonary embolus with patchy ground-glass opacity in  the right upper lobe favoring pulmonary hemorrhage. Possible  subsegmental embolus in the left upper lobe.  2. Please note that positive predictive value of today's exam is  reduced by delayed contrast bolus.  2. Mediastinal and hilar adenopathy is significantly reduced from  prior exam of 2008, with associated calcifications. Appearance  favors sarcoidosis. Currently no significant nodular airway  thickening observed.  Discussed findings and reviewed history and confirmed exam findings as documented above by Dr. Timothy Lasso.  His risk factors for PE include recent 24 hour hospitalization in 3/14 for TIA  symptoms, truck driver/sedentary lifestyle and obesity.  Discussed CT findings with Radiology.  Will admit and start on Heparin drip for PE with close monitoring of hemodynamics and blood counts in the setting of hemoptysis (related to pulmonary infarction).  Consider transition to [facility] or Coumadin if stable.

## 2013-01-31 DIAGNOSIS — I2699 Other pulmonary embolism without acute cor pulmonale: Secondary | ICD-10-CM

## 2013-01-31 LAB — CBC
HCT: 40.4 % (ref 39.0–52.0)
Hemoglobin: 14.1 g/dL (ref 13.0–17.0)
MCH: 29.3 pg (ref 26.0–34.0)
MCHC: 34.9 g/dL (ref 30.0–36.0)
MCV: 83.8 fL (ref 78.0–100.0)
Platelets: 224 10*3/uL (ref 150–400)
RBC: 4.82 MIL/uL (ref 4.22–5.81)
RDW: 14.4 % (ref 11.5–15.5)
WBC: 10 10*3/uL (ref 4.0–10.5)

## 2013-01-31 LAB — BASIC METABOLIC PANEL
BUN: 14 mg/dL (ref 6–23)
CO2: 27 mEq/L (ref 19–32)
Calcium: 9.2 mg/dL (ref 8.4–10.5)
Chloride: 103 mEq/L (ref 96–112)
Creatinine, Ser: 1.3 mg/dL (ref 0.50–1.35)
GFR calc Af Amer: 69 mL/min — ABNORMAL LOW (ref >90)
GFR calc non Af Amer: 60 mL/min — ABNORMAL LOW (ref >90)
Glucose, Bld: 98 mg/dL (ref 70–99)
Potassium: 3.3 mEq/L — ABNORMAL LOW (ref 3.5–5.1)
Sodium: 138 mEq/L (ref 135–145)

## 2013-01-31 LAB — HEPARIN LEVEL (UNFRACTIONATED)
Heparin Unfractionated: 0.88 IU/mL — ABNORMAL HIGH (ref 0.30–0.70)
Heparin Unfractionated: 0.78 IU/mL — ABNORMAL HIGH (ref 0.30–0.70)
Heparin Unfractionated: 0.54 IU/mL (ref 0.30–0.70)

## 2013-01-31 MED ORDER — POTASSIUM CHLORIDE CRYS ER 20 MEQ PO TBCR
20.0000 meq | EXTENDED_RELEASE_TABLET | Freq: Every day | ORAL | Status: DC
  Administered 2013-01-31: 20 meq via ORAL
  Filled 2013-01-31 (×2): qty 1

## 2013-01-31 MED ORDER — MENTHOL 3 MG MT LOZG
1.0000 | LOZENGE | OROMUCOSAL | Status: DC | PRN
  Administered 2013-01-31: 3 mg via ORAL
  Filled 2013-01-31: qty 9

## 2013-01-31 MED ORDER — ENALAPRIL MALEATE 10 MG PO TABS
10.0000 mg | ORAL_TABLET | Freq: Every day | ORAL | Status: DC
  Administered 2013-01-31 – 2013-02-01 (×2): 10 mg via ORAL
  Filled 2013-01-31 (×2): qty 1

## 2013-01-31 MED ORDER — ALBUTEROL SULFATE HFA 108 (90 BASE) MCG/ACT IN AERS
2.0000 | INHALATION_SPRAY | RESPIRATORY_TRACT | Status: DC | PRN
  Filled 2013-01-31: qty 6.7

## 2013-01-31 NOTE — Progress Notes (Signed)
OK to travel off tele per MD.

## 2013-01-31 NOTE — Care Management Note (Signed)
    Page 1 of 1   02/01/2013     2:22:48 PM   CARE MANAGEMENT NOTE 02/01/2013  Patient:  [name], [name]   Account Number:  [id number]  Date Initiated:  01/31/2013  Documentation initiated by:  Donzell Coller  Subjective/Objective Assessment:   PT ADM ON 01/30/13 WITH BILAT PULM EMBOLUS.  PTA, PT INDEPENDENT, LIVES WITH SPOUSE.     Action/Plan:   WILL FOLLOW FOR HOME NEEDS AS PT PROGRESSES.   Anticipated DC Date:  02/02/2013   Anticipated DC Plan:  HOME/SELF CARE      DC Planning Services  CM consult  Medication Assistance      Choice offered to / List presented to:             Status of service:  Completed, signed off Medicare Important Message given?   (If response is "NO", the following Medicare IM given date fields will be blank) Date Medicare IM given:   Date Additional Medicare IM given:    Discharge Disposition:  HOME/SELF CARE  Per UR Regulation:  Reviewed for med. necessity/level of care/duration of stay  If discussed at Long Length of Stay Meetings, dates discussed:    Comments:  02/01/13 Nishita Isaacks,RN,BSN [phone] PT FOR DC HOME TODAY WITH SPOUSE.  PT GIVEN XARELTO 10 DAY FREE TRIAL CARD AND XARELTO COPAY CARD FOR COPAY ASSISTANCE.  PT APPRECIATIVE OF HELP.

## 2013-01-31 NOTE — Progress Notes (Signed)
VASCULAR LAB PRELIMINARY  PRELIMINARY  PRELIMINARY  PRELIMINARY  Bilateral lower extremity venous duplex  completed.    Preliminary report:  Bilateral:  No evidence of DVT, superficial thrombosis, or Baker's Cyst.    Olivya Sobol, RVT 01/31/2013, 1:38 PM

## 2013-01-31 NOTE — Progress Notes (Signed)
ANTICOAGULATION CONSULT NOTE - Follow Up Consult  Pharmacy Consult for heparin Indication: pulmonary embolus  Labs:  Recent Labs  01/30/13 2225 01/31/13 0344  HGB 14.1 14.1  HCT 40.7 40.4  PLT 217 224  LABPROT 13.8  --   INR 1.07  --   HEPARINUNFRC  --  0.88*  CREATININE 1.33  --     Assessment: 57yo male supratherapeutic on heparin with initial dosing for new PE with associated hemoptysis; CBC stable.  Goal of Therapy:  Heparin level 0.3-0.7 units/ml   Plan:  Will decrease heparin gtt by 2 units/kg/hr to 1400 units/hr and check level in 6hr.  Vernard Gambles, PharmD, BCPS  01/31/2013,4:41 AM

## 2013-01-31 NOTE — Progress Notes (Signed)
Utilization Review Completed.[name] T5/28/2014  

## 2013-01-31 NOTE — Progress Notes (Signed)
ANTICOAGULATION CONSULT NOTE - Follow Up Consult  Pharmacy Consult for heparin Indication: pulmonary embolus  Allergies  Allergen Reactions  . Codeine     REACTION: Reaction not known  . Hydromorphone Other (See Comments)    hallucinations     Patient Measurements: Height: 6' (182.9 cm) Weight: 254 lb 10.1 oz (115.5 kg) IBW/kg (Calculated) : 77.6 Heparin Dosing Weight: 102.5  Vital Signs: Temp: 97.9 F (36.6 C) (05/28 1940) Temp src: Oral (05/28 1940) BP: 149/88 mmHg (05/28 1940) Pulse Rate: 59 (05/28 1940)  Labs:  Recent Labs  01/30/13 2225 01/31/13 0344 01/31/13 1336 01/31/13 2025  HGB 14.1 14.1  --   --   HCT 40.7 40.4  --   --   PLT 217 224  --   --   LABPROT 13.8  --   --   --   INR 1.07  --   --   --   HEPARINUNFRC  --  0.88* 0.78* 0.54  CREATININE 1.33 1.30  --   --     Estimated Creatinine Clearance: 83.3 ml/min (by C-G formula based on Cr of 1.3).   Assessment: 57 year old man on IV heparin for PE.  Heparin level is now therapeutic. Goal of Therapy:  Heparin level 0.3-0.7 units/ml Monitor platelets by anticoagulation protocol: Yes   Plan:  Continue heparin at 1200 units/hr and check heparin level and CBC in AM.  Mickeal Skinner 01/31/2013,9:32 PM

## 2013-01-31 NOTE — Progress Notes (Signed)
Subjective: Seen yesterday for hemoptysis s clear cause. CTPA revealed VTE=PE with hemorrhage. Admitted for IV Heparin c monitoring and after 1-2 days will be transitioned to Xarelto No current complaints.  Only dark tinged sputum.  Objective: Vital signs in last 24 hours: Temp:  [98.5 F (36.9 C)-99 F (37.2 C)] 98.5 F (36.9 C) (05/28 0620) Pulse Rate:  [71-72] 71 (05/28 0620) Resp:  [19-20] 19 (05/28 0620) BP: (154-169)/(93-104) 165/94 mmHg (05/28 0620) SpO2:  [95 %-96 %] 95 % (05/28 0620) Weight:  [115.5 kg (254 lb 10.1 oz)] 115.5 kg (254 lb 10.1 oz) (05/27 1956) Weight change:  Last BM Date: 01/28/13  CBG (last 3)  No results found for this basename: GLUCAP,  in the last 72 hours  Intake/Output from previous day:  Intake/Output Summary (Last 24 hours) at 01/31/13 0744 Last data filed at 01/31/13 9147  Gross per 24 hour  Intake      0 ml  Output    800 ml  Net   -800 ml   05/27 0701 - 05/28 0700 In: -  Out: 800 [Urine:800]   Physical Exam  General appearance: A and O Eyes: no scleral icterus Throat: oropharynx moist without erythema Resp: CTA Cardio: Reg. Monitor - NSR GI: soft, non-tender; bowel sounds normal; no masses,  no organomegaly Extremities: no clubbing, cyanosis or edema   Lab Results:  Recent Labs  01/30/13 2225 01/31/13 0344  NA 138 138  K 3.3* 3.3*  CL 102 103  CO2 26 27  GLUCOSE 156* 98  BUN 14 14  CREATININE 1.33 1.30  CALCIUM 9.3 9.2     Recent Labs  01/30/13 2225  AST 42*  ALT 61*  ALKPHOS 91  BILITOT 0.3  PROT 6.9  ALBUMIN 3.5     Recent Labs  01/30/13 2225 01/31/13 0344  WBC 9.3 10.0  HGB 14.1 14.1  HCT 40.7 40.4  MCV 84.3 83.8  PLT 217 224    Lab Results  Component Value Date   INR 1.07 01/30/2013   INR 1.02 11/16/2012   INR 1.10 03/02/2010    No results found for this basename: CKTOTAL, CKMB, CKMBINDEX, TROPONINI,  in the last 72 hours  No results found for this basename: TSH, T4TOTAL, FREET3,  T3FREE, THYROIDAB,  in the last 72 hours  No results found for this basename: VITAMINB12, FOLATE, FERRITIN, TIBC, IRON, RETICCTPCT,  in the last 72 hours  Micro Results: No results found for this or any previous visit (from the past 240 hour(s)).   Studies/Results: Ct Angio Chest Pe W/cm &/or Wo Cm  01/30/2013   *RADIOLOGY REPORT*  Clinical Data: Hemoptysis.  History of sarcoidosis.  CT ANGIOGRAPHY CHEST  Technique:  Multidetector CT imaging of the chest using the standard protocol during bolus administration of intravenous contrast. Multiplanar reconstructed images including MIPs were obtained and reviewed to evaluate the vascular anatomy.  Contrast: OMNIPAQUE IOHEXOL 350 MG/ML SOLN  Comparison: Multiple exams, including 11/16/2012 and 02/07/2007  Findings: Suboptimal timing bolus, with systemic arterial contrast chest as dense as the pulmonary arterial contrast.  Taking this into account, there is greater than expected hypodensity in right upper lobe pulmonary arterial branches anteriorly, with associated ground-glass and alveolar opacity in the served segment of the right upper lobe, suspicious for pulmonary hemorrhage in the setting of pulmonary embolus.  There is potentially some subsegmental pulmonary embolus in the left upper lobe.  No well- defined lower lobe embolic disease observed.  Right paratracheal node, 1.2 cm in short  axis.  Calcified mediastinal and AP window lymph nodes observed.  Right hilar node 1.7 cm.  Calcified left hilar and subcarinal lymph nodes noted.  Interventricular septum mildly thickened at 1.7 cm.  Small gastrohepatic ligament lymph nodes observed.  IMPRESSION:  1.  Appearance consistent with right upper lobe segmental and subsegmental pulmonary embolus with patchy ground-glass opacity in the right upper lobe favoring pulmonary hemorrhage.  Possible subsegmental embolus in the left upper lobe. 2.  Please note that positive predictive value of today's exam is reduced  by delayed contrast bolus. 2.  Mediastinal and hilar adenopathy is significantly reduced from prior exam of 2008, with associated calcifications.  Appearance favors sarcoidosis.  Currently no significant nodular airway thickening observed.   Original Report Authenticated By: Gaylyn Rong, M.D.     Medications: Scheduled: . atorvastatin  10 mg Oral Daily  . diltiazem  300 mg Oral Daily  . doxazosin  4 mg Oral QHS  . enalapril  5 mg Oral Daily  . irbesartan  300 mg Oral Daily  . labetalol  300 mg Oral TID  . predniSONE  5 mg Oral Daily  . sodium chloride  3 mL Intravenous Q12H  . sodium chloride  3 mL Intravenous Q12H   Continuous: . heparin 1,400 Units/hr (01/31/13 0505)     Assessment/Plan: Principal Problem:   Pulmonary embolism Active Problems:   Sarcoidosis   Hemoptysis  VTE/PE/Pulm Hemorrhage - IV Heparin and monitoring for 1-2 days, then D/C on Xarelto.  Check LE Doppers. CVA vrs Med SE issues 11/2012 s recurrence. Has officially quit smoking again. Sarcoidosis - Low dose pred. Hypokalemia - replete. HTN - BP a little high - adjust as needed.  Stay on home meds. Obesity - needs weight loss. Cr staying stable at 1.3 post contrast. Follow.   Walk    LOS: 1 day   Vedha Tercero M 01/31/2013, 7:44 AM

## 2013-01-31 NOTE — Progress Notes (Signed)
ANTICOAGULATION CONSULT NOTE - Follow Up Consult  Pharmacy Consult for heparin Indication: pulmonary embolus  Labs:  Recent Labs  01/30/13 2225 01/31/13 0344 01/31/13 1336  HGB 14.1 14.1  --   HCT 40.7 40.4  --   PLT 217 224  --   LABPROT 13.8  --   --   INR 1.07  --   --   HEPARINUNFRC  --  0.88* 0.78*  CREATININE 1.33 1.30  --     Assessment: 57yo male on heparin for new PE with associated hemoptysis/hemmorrhage; CBC stable.  His HL is slightly above goal at 0.78 after rate has been decrease to 1400 units/hr.  No more frank hemoptysis reported, only dark tinged sputum.  Plan is to monitor on heparin and then switch to Xarelto.  Goal of Therapy:  Heparin level 0.3-0.7 units/ml   Plan:  Will decrease heparin gtt by 2 units/kg/hr to 1200 units/hr and check level in 6hr. Herby Abraham, Pharm.D. [phone] 01/31/2013 2:39 PM

## 2013-02-01 ENCOUNTER — Inpatient Hospital Stay (HOSPITAL_COMMUNITY): Payer: BC Managed Care – PPO

## 2013-02-01 LAB — HEPARIN LEVEL (UNFRACTIONATED): Heparin Unfractionated: 0.48 IU/mL (ref 0.30–0.70)

## 2013-02-01 LAB — BASIC METABOLIC PANEL
BUN: 12 mg/dL (ref 6–23)
CO2: 22 mEq/L (ref 19–32)
Calcium: 9.4 mg/dL (ref 8.4–10.5)
Chloride: 102 mEq/L (ref 96–112)
Creatinine, Ser: 1.25 mg/dL (ref 0.50–1.35)
GFR calc Af Amer: 73 mL/min — ABNORMAL LOW (ref >90)
GFR calc non Af Amer: 63 mL/min — ABNORMAL LOW (ref >90)
Glucose, Bld: 98 mg/dL (ref 70–99)
Potassium: 3.4 mEq/L — ABNORMAL LOW (ref 3.5–5.1)
Sodium: 139 mEq/L (ref 135–145)

## 2013-02-01 LAB — CBC
HCT: 40.8 % (ref 39.0–52.0)
Hemoglobin: 14.2 g/dL (ref 13.0–17.0)
MCH: 29.3 pg (ref 26.0–34.0)
MCHC: 34.8 g/dL (ref 30.0–36.0)
MCV: 84.3 fL (ref 78.0–100.0)
Platelets: 208 10*3/uL (ref 150–400)
RBC: 4.84 MIL/uL (ref 4.22–5.81)
RDW: 14.6 % (ref 11.5–15.5)
WBC: 8.9 10*3/uL (ref 4.0–10.5)

## 2013-02-01 MED ORDER — RIVAROXABAN 20 MG PO TABS: 20.0000 mg | ORAL_TABLET | Freq: Every day | ORAL | Status: DC

## 2013-02-01 MED ORDER — ACETAMINOPHEN 325 MG PO TABS: 650.0000 mg | ORAL_TABLET | Freq: Four times a day (QID) | ORAL | Status: DC | PRN

## 2013-02-01 MED ORDER — POTASSIUM CHLORIDE CRYS ER 20 MEQ PO TBCR
40.0000 meq | EXTENDED_RELEASE_TABLET | Freq: Every day | ORAL | Status: DC
  Administered 2013-02-01: 40 meq via ORAL
  Filled 2013-02-01: qty 2

## 2013-02-01 MED ORDER — RIVAROXABAN 15 MG PO TABS
15.0000 mg | ORAL_TABLET | Freq: Two times a day (BID) | ORAL | Status: DC
  Filled 2013-02-01 (×2): qty 1

## 2013-02-01 MED ORDER — SALINE SPRAY 0.65 % NA SOLN: 2.0000 | NASAL | Status: DC | PRN

## 2013-02-01 MED ORDER — ASPIRIN 81 MG PO TABS: 80.0000 mg | ORAL_TABLET | Freq: Every day | ORAL | Status: DC

## 2013-02-01 MED ORDER — RIVAROXABAN 15 MG PO TABS: ORAL_TABLET | ORAL | Status: DC

## 2013-02-01 MED ORDER — ZOLPIDEM TARTRATE 10 MG PO TABS: ORAL_TABLET | ORAL | Status: DC

## 2013-02-01 MED ORDER — RIVAROXABAN 15 MG PO TABS
15.0000 mg | ORAL_TABLET | Freq: Two times a day (BID) | ORAL | Status: DC
  Administered 2013-02-01: 15 mg via ORAL
  Filled 2013-02-01 (×3): qty 1

## 2013-02-01 NOTE — Discharge Summary (Signed)
Physician Discharge Summary  DISCHARGE SUMMARY   Patient ID: [name] MR#: [id number] DOB/AGE: November 12, 1955 57 y.o.   Attending Physician:Justeen Hehr M  Patient's AOZ:HYQMV,HQIO M, MD  Consults:  none.  Admit date: 01/30/2013 Discharge date: 02/01/2013  Discharge Diagnoses:  Principal Problem:   Pulmonary embolism Active Problems:   Sarcoidosis   Hemoptysis   Patient Active Problem List   Diagnosis Date Noted  . Pulmonary embolism 01/30/2013  . Sarcoidosis 01/30/2013  . Hemoptysis 01/30/2013  . SHOULDER PAIN, LEFT 11/28/2009   Past Medical History  Diagnosis Date  . Pancreatitis   . Hypertension   . Sarcoidosis   . Hyperlipidemia     Discharged Condition: Stable.   Discharge Medications:   Medication List    TAKE these medications       acetaminophen 325 MG tablet  Commonly known as:  TYLENOL  Take 2 tablets (650 mg total) by mouth every 6 (six) hours as needed.     aspirin 81 MG tablet  Take 1 tablet (81 mg total) by mouth daily.     atorvastatin 10 MG tablet  Commonly known as:  LIPITOR  Take 1 tablet (10 mg total) by mouth daily.     diltiazem 360 MG 24 hr capsule  Commonly known as:  CARDIZEM CD  Take 360 mg by mouth daily.     doxazosin 4 MG tablet  Commonly known as:  CARDURA  Take 1 tablet (4 mg total) by mouth at bedtime.     enalapril 5 MG tablet  Commonly known as:  VASOTEC  Take 5 mg by mouth daily.     labetalol 300 MG tablet  Commonly known as:  NORMODYNE  Take 300 mg by mouth 3 (three) times daily.     olmesartan 40 MG tablet  Commonly known as:  BENICAR  Take 40 mg by mouth daily.     predniSONE 5 MG tablet  Commonly known as:  DELTASONE  Take 5 mg by mouth daily.     Rivaroxaban 20 MG Tabs  Commonly known as:  XARELTO  Take 1 tablet (20 mg total) by mouth daily.     Rivaroxaban 15 MG Tabs tablet  Commonly known as:  XARELTO  Take 1 tablet (15 mg total) by mouth 2 (two) times daily with food for 21 days then  switch to the 20 mg tabs for once per day.     sodium chloride 0.65 % Soln nasal spray  Commonly known as:  OCEAN  Place 2 sprays into the nose as needed for congestion.     zolpidem 10 MG tablet  Commonly known as:  AMBIEN  1/2 tab = 5 mg PO HS prn Insomnia        Hospital Procedures: Ct Angio Chest Pe W/cm &/or Wo Cm  01/30/2013   *RADIOLOGY REPORT*  Clinical Data: Hemoptysis.  History of sarcoidosis.  CT ANGIOGRAPHY CHEST  Technique:  Multidetector CT imaging of the chest using the standard protocol during bolus administration of intravenous contrast. Multiplanar reconstructed images including MIPs were obtained and reviewed to evaluate the vascular anatomy.  Contrast: OMNIPAQUE IOHEXOL 350 MG/ML SOLN  Comparison: Multiple exams, including 11/16/2012 and 02/07/2007  Findings: Suboptimal timing bolus, with systemic arterial contrast chest as dense as the pulmonary arterial contrast.  Taking this into account, there is greater than expected hypodensity in right upper lobe pulmonary arterial branches anteriorly, with associated ground-glass and alveolar opacity in the served segment of the right upper lobe, suspicious for pulmonary  hemorrhage in the setting of pulmonary embolus.  There is potentially some subsegmental pulmonary embolus in the left upper lobe.  No well- defined lower lobe embolic disease observed.  Right paratracheal node, 1.2 cm in short axis.  Calcified mediastinal and AP window lymph nodes observed.  Right hilar node 1.7 cm.  Calcified left hilar and subcarinal lymph nodes noted.  Interventricular septum mildly thickened at 1.7 cm.  Small gastrohepatic ligament lymph nodes observed.  IMPRESSION:  1.  Appearance consistent with right upper lobe segmental and subsegmental pulmonary embolus with patchy ground-glass opacity in the right upper lobe favoring pulmonary hemorrhage.  Possible subsegmental embolus in the left upper lobe. 2.  Please note that positive predictive value of  today's exam is reduced by delayed contrast bolus. 2.  Mediastinal and hilar adenopathy is significantly reduced from prior exam of 2008, with associated calcifications.  Appearance favors sarcoidosis.  Currently no significant nodular airway thickening observed.   Original Report Authenticated By: Gaylyn Rong, M.D.    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  The blood slowed down over the weekend.  He denied any other Sxs although after hospitalization he said he had some chest pain, pre-syncope, and other Sxs that he forgot about.  I did labs and a CXR in my office and exam was unremarkable.  I sent him for a CTPA and it showed B PE and Pulm Hemorrhage.  He was called and directly admitted to [facility] for eval and Rx including IV Heparin with Monitoring.   Hospital Course: He was admitted for B PE - His risk factors for PE include recent 24 hour hospitalization in 3/14 for TIA symptoms, truck driver/sedentary lifestyle and obesity. He did well and had no C/O.  He is up to date on cancer screens. He had LE dopplers (-) for PE. He remained HD stable. IV Heparin was managed by Pharmacy. After 36-48 hrs of stability on the anticoagulation it was felt to be safe to transition him to oral Xarelto and work on D/C for that day. He did not need any other tests or procedures. We attempted tighter BP control and Repleted Potassium. Hbgs stayed in 14.1-14.2 range. He had a @2D  echo last hospital stay in March: 2D ECHO Left ventricle: The cavity size was normal. Wall thickness was increased in a pattern of moderate LVH. Systolic function was normal. The estimated ejection fraction was in the range of 55% to 60%. Wall motion was normal; there were no regional wall motion abnormalities. Doppler parameters are consistent with abnormal left  ventricular relaxation (grade 1 diastolic dysfunction). - Left atrium: The atrium was mildly dilated. He has remained sinus rhythm.  Repeat CXR showed RUL opacity corresponding to CT issues.  He tolerated the Xarelto and was educated.  Hep is off - Ok for D/c  No more hemoptysis.  11/2012 Admission for L sided weakness, facial droop, slurred speech and MS changes=confusion and mental slowing - Medication SEs of using Both Ambien and Benedryl vrs TIA as W/up was (-). Continue current meds and push healthy lifestyle.  HTN - Goal = <135/85.  Hyperlipidemia - continue statins. Sinus congestion/Allergies - Nasal Saline +/- Flonase/Nasonex. No Antihistamines.  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  Ambien to 5 HS prn as outpt.  Recently again quit the 1/2 PPD  Smoking - It is stupid for someone with all his CV Risk factors, Fm H/O Lung cancer and known Sarcoid to be smoking. He needs to stay quit.  Known CKD 3 c baseline Cr 1.6 c Macroalbuminuria/Proteinuria and ?FSGS - managed with BP control by me and Dr Lanier Clam. Cr 1.25-1.3 here in hospital and K needed correction.  Being on ACE and ARB I will not D/c him on Potassium - Will just recheck soon. Obesity with BMI 34+ - Needs weight loss.  IFG - A1C's  In 5's - needs weight loss  OA - Per Dr Magnus Ivan - No NSAIDs.  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3  PLT 224 208   No results found for this basename: CKTOTAL, CKMB, CKMBINDEX, TROPONINI,  in the last 72 hours No results found for this basename: TSH, T4TOTAL, FREET3, T3FREE, THYROIDAB,  in the last 72 hours No results found for this basename: VITAMINB12, FOLATE, FERRITIN, TIBC, IRON, RETICCTPCT,  in the last 72 hours Lab Results  Component Value Date   INR 1.07 01/30/2013   INR 1.02 11/16/2012   INR 1.10 03/02/2010       Discharge instructions:  01-Home or Self Care Follow-up Information   Follow up with Gwen Pounds, MD In 14 days.   Contact information:   [address] [facility] [place] [address] [phone]        Disposition: home  OK to return to work on Monday  Follow-up Appts: Follow-up with Dr. Timothy Lasso at [facility] in 1-2 weeks..  Call for appointment.  Condition on Discharge: stable  Tests Needing Follow-up: Labs  Time spent in discharge (includes decision making & examination of pt): 25 min  Signed: Theora Vankirk M 02/01/2013, 12:31 PM

## 2013-02-01 NOTE — Progress Notes (Signed)
Subjective: F/Up PE with hemorrhage. No DVT currently. Remains on IV Heparin c monitoring and will be transitioned to Xarelto No current complaints.  Only dark tinged sputum yesterday and none today. Been walking. No CP/SOB/Dizzy or any issues.   Objective: Vital signs in last 24 hours: Temp:  [97.9 F (36.6 C)-98.6 F (37 C)] 98.6 F (37 C) (05/29 0433) Pulse Rate:  [59-93] 91 (05/29 0433) Resp:  [18] 18 (05/29 0433) BP: (134-155)/(79-91) 153/79 mmHg (05/29 0433) SpO2:  [93 %-95 %] 95 % (05/29 0433) Weight change:  Last BM Date: 01/31/13  CBG (last 3)  No results found for this basename: GLUCAP,  in the last 72 hours  Intake/Output from previous day:  Intake/Output Summary (Last 24 hours) at 02/01/13 0717 Last data filed at 02/01/13 0049  Gross per 24 hour  Intake   1200 ml  Output   1450 ml  Net   -250 ml   05/28 0701 - 05/29 0700 In: 1200 [P.O.:1200] Out: 1450 [Urine:1450]   Physical Exam  General appearance: A and O Eyes: no scleral icterus Throat: oropharynx moist without erythema Resp: CTA Cardio: Reg. Monitor - NSR GI: soft, non-tender; bowel sounds normal; no masses,  no organomegaly Extremities: no clubbing, cyanosis or edema   Lab Results:  Recent Labs  01/31/13 0344 02/01/13 0520  NA 138 139  K 3.3* 3.4*  CL 103 102  CO2 27 22  GLUCOSE 98 98  BUN 14 12  CREATININE 1.30 1.25  CALCIUM 9.2 9.4     Recent Labs  01/30/13 2225  AST 42*  ALT 61*  ALKPHOS 91  BILITOT 0.3  PROT 6.9  ALBUMIN 3.5     Recent Labs  01/31/13 0344 02/01/13 0520  WBC 10.0 8.9  HGB 14.1 14.2  HCT 40.4 40.8  MCV 83.8 84.3  PLT 224 208    Lab Results  Component Value Date   INR 1.07 01/30/2013   INR 1.02 11/16/2012   INR 1.10 03/02/2010    No results found for this basename: CKTOTAL, CKMB, CKMBINDEX, TROPONINI,  in the last 72 hours  No results found for this basename: TSH, T4TOTAL, FREET3, T3FREE, THYROIDAB,  in the last 72 hours  No results  found for this basename: VITAMINB12, FOLATE, FERRITIN, TIBC, IRON, RETICCTPCT,  in the last 72 hours  Micro Results: No results found for this or any previous visit (from the past 240 hour(s)).   Studies/Results: Ct Angio Chest Pe W/cm &/or Wo Cm  01/30/2013   *RADIOLOGY REPORT*  Clinical Data: Hemoptysis.  History of sarcoidosis.  CT ANGIOGRAPHY CHEST  Technique:  Multidetector CT imaging of the chest using the standard protocol during bolus administration of intravenous contrast. Multiplanar reconstructed images including MIPs were obtained and reviewed to evaluate the vascular anatomy.  Contrast: OMNIPAQUE IOHEXOL 350 MG/ML SOLN  Comparison: Multiple exams, including 11/16/2012 and 02/07/2007  Findings: Suboptimal timing bolus, with systemic arterial contrast chest as dense as the pulmonary arterial contrast.  Taking this into account, there is greater than expected hypodensity in right upper lobe pulmonary arterial branches anteriorly, with associated ground-glass and alveolar opacity in the served segment of the right upper lobe, suspicious for pulmonary hemorrhage in the setting of pulmonary embolus.  There is potentially some subsegmental pulmonary embolus in the left upper lobe.  No well- defined lower lobe embolic disease observed.  Right paratracheal node, 1.2 cm in short axis.  Calcified mediastinal and AP window lymph nodes observed.  Right hilar node 1.7 cm.  Calcified left hilar and subcarinal lymph nodes noted.  Interventricular septum mildly thickened at 1.7 cm.  Small gastrohepatic ligament lymph nodes observed.  IMPRESSION:  1.  Appearance consistent with right upper lobe segmental and subsegmental pulmonary embolus with patchy ground-glass opacity in the right upper lobe favoring pulmonary hemorrhage.  Possible subsegmental embolus in the left upper lobe. 2.  Please note that positive predictive value of today's exam is reduced by delayed contrast bolus. 2.  Mediastinal and hilar  adenopathy is significantly reduced from prior exam of 2008, with associated calcifications.  Appearance favors sarcoidosis.  Currently no significant nodular airway thickening observed.   Original Report Authenticated By: Gaylyn Rong, M.D.     Medications: Scheduled: . atorvastatin  10 mg Oral Daily  . diltiazem  300 mg Oral Daily  . doxazosin  4 mg Oral QHS  . enalapril  10 mg Oral Daily  . irbesartan  300 mg Oral Daily  . labetalol  300 mg Oral TID  . potassium chloride  20 mEq Oral Daily  . predniSONE  5 mg Oral Daily  . sodium chloride  3 mL Intravenous Q12H  . sodium chloride  3 mL Intravenous Q12H   Continuous: . heparin 1,200 Units/hr (02/01/13 0512)     Assessment/Plan: Principal Problem:   Pulmonary embolism Active Problems:   Sarcoidosis   Hemoptysis  VTE/PE/Pulm Hemorrhage - IV Heparin and will D/C on Xarelto.  LE Doppers (-) for DVT. Anticipate D/C later today. CVA vrs Med SE issues 11/2012 s recurrence. Has officially quit smoking again. Sarcoidosis - Low dose pred. CT chest looked better than last CT scan Hypokalemia - replete more. HTN - BP a little high - Meds were adjusted. Obesity - needs weight loss. Cr staying stable at 1.25 post contrast. Follow.   Walk    LOS: 2 days   Michelle Vanhise M 02/01/2013, 7:17 AM

## 2013-02-01 NOTE — Progress Notes (Signed)
Pt and wife educated and informed of DC information. Verbalizes understanding of medications and f/u appt with Dr. Timothy Lasso. IV and tele box removed. Pt ready for DC with wife.

## 2013-03-10 ENCOUNTER — Emergency Department (HOSPITAL_COMMUNITY): Payer: BC Managed Care – PPO

## 2013-03-10 ENCOUNTER — Encounter (HOSPITAL_COMMUNITY): Payer: Self-pay | Admitting: Emergency Medicine

## 2013-03-10 ENCOUNTER — Emergency Department (HOSPITAL_COMMUNITY)
Admission: EM | Admit: 2013-03-10 | Discharge: 2013-03-10 | Disposition: A | Discharge status: Home / Self Care | Payer: BC Managed Care – PPO | Attending: Emergency Medicine | Admitting: Emergency Medicine

## 2013-03-10 DIAGNOSIS — Z8719 Personal history of other diseases of the digestive system: Secondary | ICD-10-CM | POA: Insufficient documentation

## 2013-03-10 DIAGNOSIS — Z8619 Personal history of other infectious and parasitic diseases: Secondary | ICD-10-CM | POA: Insufficient documentation

## 2013-03-10 DIAGNOSIS — M76899 Other specified enthesopathies of unspecified lower limb, excluding foot: Secondary | ICD-10-CM | POA: Insufficient documentation

## 2013-03-10 DIAGNOSIS — E785 Hyperlipidemia, unspecified: Secondary | ICD-10-CM | POA: Insufficient documentation

## 2013-03-10 DIAGNOSIS — Z7982 Long term (current) use of aspirin: Secondary | ICD-10-CM | POA: Insufficient documentation

## 2013-03-10 DIAGNOSIS — Z79899 Other long term (current) drug therapy: Secondary | ICD-10-CM | POA: Insufficient documentation

## 2013-03-10 DIAGNOSIS — F17200 Nicotine dependence, unspecified, uncomplicated: Secondary | ICD-10-CM | POA: Insufficient documentation

## 2013-03-10 DIAGNOSIS — I10 Essential (primary) hypertension: Secondary | ICD-10-CM | POA: Insufficient documentation

## 2013-03-10 DIAGNOSIS — Z7901 Long term (current) use of anticoagulants: Secondary | ICD-10-CM | POA: Insufficient documentation

## 2013-03-10 MED ORDER — ONDANSETRON HCL 4 MG PO TABS: 4.0000 mg | ORAL_TABLET | Freq: Four times a day (QID) | ORAL | Status: DC

## 2013-03-10 MED ORDER — OXYCODONE-ACETAMINOPHEN 5-325 MG PO TABS: 1.0000 | ORAL_TABLET | ORAL | Status: DC | PRN

## 2013-03-10 NOTE — ED Notes (Signed)
Pt. Reports right knee pain with swelling onset this morning , denies fall or injury , alert and oriented , respirations unlabored.

## 2013-03-10 NOTE — ED Provider Notes (Signed)
History    This chart was scribed for non-physician practitioner working with Manuel Booze, MD by Leone Payor, ED Scribe. This patient was seen in room TR09C/TR09C and the patient's care was started at 1929.  CSN: [id number] Arrival date & time 03/10/13  1929  First MD Initiated Contact with Patient 03/10/13 2001     Chief Complaint  Patient presents with  . Knee Pain    The history is provided by the patient. No language interpreter was used.    HPI Comments: [name] is a 57 y.o. male who presents to the Emergency Department complaining of constant, worsening R knee pain with associated swelling starting this morning. States he had a similar symptoms about 3 months ago but resolved on its own. He denies any falls or injuries that triggered this pain. States he was standing when this pain began. He describes the pain as aching and throbbing. The pain does not radiate. Denies h/o knee surgery. Nothing alleviates the pain. Cold temperatures aggravate the pain. He denies numbness, tingling, weakness, fever,   Past Medical History  Diagnosis Date  . Pancreatitis   . Hypertension   . Sarcoidosis   . Hyperlipidemia    Past Surgical History  Procedure Laterality Date  . Cholecystectomy    . Shoulder surgery    . Back surgery    . Abdominal exploration surgery     Family History  Problem Relation Age of Onset  . Hypertension Father    History  Substance Use Topics  . Smoking status: Current Some Day Smoker    Types: Cigarettes  . Smokeless tobacco: Not on file  . Alcohol Use: No    Review of Systems  Constitutional: Negative for fever.  Musculoskeletal: Positive for joint swelling and arthralgias (R knee pain).  Neurological: Negative for weakness and numbness.    Allergies  Codeine and Hydromorphone  Home Medications   Current Outpatient Rx  Name  Route  Sig  Dispense  Refill  . aspirin 81 MG tablet   Oral   Take 1 tablet (81 mg total) by mouth daily.          Marland Kitchen atorvastatin (LIPITOR) 10 MG tablet   Oral   Take 1 tablet (10 mg total) by mouth daily.   30 tablet   11   . diltiazem (CARDIZEM CD) 360 MG 24 hr capsule   Oral   Take 360 mg by mouth daily.         Marland Kitchen doxazosin (CARDURA) 4 MG tablet   Oral   Take 1 tablet (4 mg total) by mouth at bedtime.   30 tablet   11   . enalapril (VASOTEC) 5 MG tablet   Oral   Take 5 mg by mouth daily.         Marland Kitchen labetalol (NORMODYNE) 300 MG tablet   Oral   Take 300 mg by mouth 3 (three) times daily.         Marland Kitchen olmesartan (BENICAR) 40 MG tablet   Oral   Take 40 mg by mouth daily.         . predniSONE (DELTASONE) 5 MG tablet   Oral   Take 5 mg by mouth daily.         . Rivaroxaban (XARELTO) 20 MG TABS   Oral   Take 20 mg by mouth daily.         Marland Kitchen zolpidem (AMBIEN) 10 MG tablet   Oral   Take 5 mg by  mouth at bedtime as needed for sleep.         Marland Kitchen ondansetron (ZOFRAN) 4 MG tablet   Oral   Take 1 tablet (4 mg total) by mouth every 6 (six) hours.   12 tablet   0   . oxyCODONE-acetaminophen (PERCOCET/ROXICET) 5-325 MG per tablet   Oral   Take 1 tablet by mouth every 4 (four) hours as needed for pain.   15 tablet   0    BP 157/102  Pulse 75  Temp(Src) 98.4 F (36.9 C) (Oral)  Resp 20  SpO2 98% Physical Exam  Nursing note and vitals reviewed. Constitutional: He is oriented to person, place, and time. He appears well-developed and well-nourished. No distress.  HENT:  Head: Normocephalic and atraumatic.  Eyes: Conjunctivae are normal.  Neck: Neck supple.  Musculoskeletal: He exhibits tenderness.       Right knee: He exhibits decreased range of motion (due to pain). He exhibits no ecchymosis, no deformity, no erythema, normal alignment, no LCL laxity, normal patellar mobility, normal meniscus and no MCL laxity. Tenderness found. No medial joint line and no lateral joint line tenderness noted.       Legs: R knee swelling. No ecchymosis. Decreased ROM due to pain.    Neurological: He is alert and oriented to person, place, and time.  Skin: Skin is warm and dry. He is not diaphoretic.  Psychiatric: He has a normal mood and affect.    ED Course  Procedures (including critical care time)  DIAGNOSTIC STUDIES: Oxygen Saturation is 99% on RA, normal by my interpretation.    COORDINATION OF CARE: 8:47 PM Discussed treatment plan with pt at bedside and pt agreed to plan.   Labs Reviewed - No data to display Dg Knee Complete 4 Views Right  03/10/2013   *RADIOLOGY REPORT*  Clinical Data: Anterior knee pain  RIGHT KNEE - COMPLETE 4+ VIEW  Comparison: None.  Findings: No acute fracture, malalignment or suprapatellar joint effusion.  There is some thickening of the quadriceps tendon.  Mild tricompartmental degenerative changes.  Incidental note is made of a fabella.  Normal bony mineralization.  IMPRESSION:  1.  No acute fracture or joint effusion. 2.  Nonspecific apparent thickening of the quadriceps tendon. Query clinical signs and symptoms of quadriceps tendinopathy. 3.  Mild degenerative changes.   Original Report Authenticated By: Malachy Moan, M.D.   1. Quadriceps tendinitis     MDM  Neurovascularly intact. Able to ambulate but w/ pain. Imaging shows no fracture. Directed pt to ice injury, take acetaminophen or ibuprofen for pain, and to elevate and rest the injury when possible. Provided crutches for support and comfort. Advised ortho f/u. Patient is agreeable to plan. Patient is stable at time of discharge     I personally performed the services described in this documentation, which was scribed in my presence. The recorded information has been reviewed and is accurate.    Lise Auer Albert Hersch, PA-C 03/11/13 1500

## 2013-03-18 NOTE — ED Provider Notes (Signed)
Medical screening examination/treatment/procedure(s) were performed by non-physician practitioner and as supervising physician I was immediately available for consultation/collaboration.   [name], MD 03/18/13 [phone]

## 2013-04-11 ENCOUNTER — Encounter (HOSPITAL_COMMUNITY): Payer: Self-pay | Admitting: Emergency Medicine

## 2013-04-11 ENCOUNTER — Emergency Department (INDEPENDENT_AMBULATORY_CARE_PROVIDER_SITE_OTHER)
Admission: EM | Admit: 2013-04-11 | Discharge: 2013-04-11 | Disposition: A | Discharge status: Home / Self Care | Payer: BC Managed Care – PPO | Source: Home / Self Care

## 2013-04-11 DIAGNOSIS — S8010XA Contusion of unspecified lower leg, initial encounter: Secondary | ICD-10-CM

## 2013-04-11 DIAGNOSIS — S8011XA Contusion of right lower leg, initial encounter: Secondary | ICD-10-CM

## 2013-04-11 NOTE — ED Provider Notes (Signed)
CSN: [id number]     Arrival date & time 04/11/13  1817 History     None    Chief Complaint  Patient presents with  . Cyst   (Consider location/radiation/quality/duration/timing/severity/associated sxs/prior Treatment) Patient is a 57 y.o. male presenting with leg pain. The history is provided by the patient and the spouse.  Leg Pain Location:  Leg Time since incident:  1 week Injury: no   Leg location:  R upper leg Pain details:    Quality:  Aching (local sts 2cm circ ecchymotic area to right lat thigh.)   Radiates to:  Does not radiate   Severity:  Mild   Progression:  Unchanged Chronicity:  New Dislocation: no   Prior injury to area:  Unable to specify Relieved by:  Nothing Worsened by:  Nothing tried   Past Medical History  Diagnosis Date  . Pancreatitis   . Hypertension   . Sarcoidosis   . Hyperlipidemia    Past Surgical History  Procedure Laterality Date  . Cholecystectomy    . Shoulder surgery    . Back surgery    . Abdominal exploration surgery     Family History  Problem Relation Age of Onset  . Hypertension Father    History  Substance Use Topics  . Smoking status: Current Some Day Smoker    Types: Cigarettes  . Smokeless tobacco: Not on file  . Alcohol Use: No    Review of Systems  Constitutional: Negative.   Hematological: Bruises/bleeds easily.    Allergies  Codeine and Hydromorphone  Home Medications   Current Outpatient Rx  Name  Route  Sig  Dispense  Refill  . aspirin 81 MG tablet   Oral   Take 1 tablet (81 mg total) by mouth daily.         Marland Kitchen atorvastatin (LIPITOR) 10 MG tablet   Oral   Take 1 tablet (10 mg total) by mouth daily.   30 tablet   11   . Rivaroxaban (XARELTO) 20 MG TABS   Oral   Take 20 mg by mouth daily.         Marland Kitchen zolpidem (AMBIEN) 10 MG tablet   Oral   Take 5 mg by mouth at bedtime as needed for sleep.         Marland Kitchen diltiazem (CARDIZEM CD) 360 MG 24 hr capsule   Oral   Take 360 mg by mouth daily.          Marland Kitchen doxazosin (CARDURA) 4 MG tablet   Oral   Take 1 tablet (4 mg total) by mouth at bedtime.   30 tablet   11   . enalapril (VASOTEC) 5 MG tablet   Oral   Take 5 mg by mouth daily.         Marland Kitchen labetalol (NORMODYNE) 300 MG tablet   Oral   Take 300 mg by mouth 3 (three) times daily.         Marland Kitchen olmesartan (BENICAR) 40 MG tablet   Oral   Take 40 mg by mouth daily.         . ondansetron (ZOFRAN) 4 MG tablet   Oral   Take 1 tablet (4 mg total) by mouth every 6 (six) hours.   12 tablet   0   . oxyCODONE-acetaminophen (PERCOCET/ROXICET) 5-325 MG per tablet   Oral   Take 1 tablet by mouth every 4 (four) hours as needed for pain.   15 tablet   0   . predniSONE (  DELTASONE) 5 MG tablet   Oral   Take 5 mg by mouth daily.          BP 142/101  Pulse 88  Temp(Src) 98.8 F (37.1 C) (Oral)  Resp 24  SpO2 97% Physical Exam  Nursing note and vitals reviewed. Constitutional: He is oriented to person, place, and time. He appears well-developed and well-nourished. No distress.  Musculoskeletal: He exhibits tenderness.  Neurological: He is alert and oriented to person, place, and time.  Skin: Skin is warm and dry.  2cm local hematoma to right lat thigh, min soreness, no infection.    ED Course   Procedures (including critical care time)  Labs Reviewed - No data to display No results found. 1. Hematoma of leg, right, initial encounter     MDM    Linna Hoff, MD 04/11/13 1850

## 2013-04-11 NOTE — ED Notes (Signed)
Pt c/o knot on lateral part of left thigh onset 6 days... Hx of pulmonary embolism... Taking Xarelto and aspirin daily... Alert w/no signs of acute distress.

## 2014-01-18 ENCOUNTER — Other Ambulatory Visit: Payer: Self-pay | Admitting: Internal Medicine

## 2014-01-18 DIAGNOSIS — N29 Other disorders of kidney and ureter in diseases classified elsewhere: Principal | ICD-10-CM

## 2014-01-18 DIAGNOSIS — R319 Hematuria, unspecified: Secondary | ICD-10-CM

## 2014-01-18 DIAGNOSIS — N200 Calculus of kidney: Secondary | ICD-10-CM

## 2014-01-21 ENCOUNTER — Ambulatory Visit
Admission: RE | Admit: 2014-01-21 | Discharge: 2014-01-21 | Disposition: A | Discharge status: Home / Self Care | Payer: BC Managed Care – PPO | Source: Ambulatory Visit | Attending: Internal Medicine | Admitting: Internal Medicine

## 2014-01-21 DIAGNOSIS — R319 Hematuria, unspecified: Secondary | ICD-10-CM

## 2014-01-21 DIAGNOSIS — N200 Calculus of kidney: Secondary | ICD-10-CM

## 2014-01-21 DIAGNOSIS — N29 Other disorders of kidney and ureter in diseases classified elsewhere: Principal | ICD-10-CM

## 2014-07-07 ENCOUNTER — Ambulatory Visit (INDEPENDENT_AMBULATORY_CARE_PROVIDER_SITE_OTHER): Payer: BC Managed Care – PPO | Admitting: Emergency Medicine

## 2014-07-07 VITALS — BP 138/82 | HR 82 | Temp 98.7°F | Resp 18 | Ht 71.0 in | Wt 264.0 lb

## 2014-07-07 DIAGNOSIS — J018 Other acute sinusitis: Secondary | ICD-10-CM

## 2014-07-07 DIAGNOSIS — J209 Acute bronchitis, unspecified: Secondary | ICD-10-CM

## 2014-07-07 DIAGNOSIS — J40 Bronchitis, not specified as acute or chronic: Secondary | ICD-10-CM

## 2014-07-07 MED ORDER — PSEUDOEPHEDRINE-GUAIFENESIN ER 60-600 MG PO TB12
1.0000 | ORAL_TABLET | Freq: Two times a day (BID) | ORAL | Status: DC
Start: 2014-07-07 — End: 2014-11-02

## 2014-07-07 MED ORDER — LEVOFLOXACIN 500 MG PO TABS: 500.0000 mg | ORAL_TABLET | Freq: Every day | ORAL | Status: AC

## 2014-07-07 MED ORDER — ALBUTEROL SULFATE HFA 108 (90 BASE) MCG/ACT IN AERS: 2.0000 | INHALATION_SPRAY | RESPIRATORY_TRACT | Status: DC | PRN

## 2014-07-07 MED ORDER — HYDROCOD POLST-CHLORPHEN POLST 10-8 MG/5ML PO LQCR: 5.0000 mL | Freq: Two times a day (BID) | ORAL | Status: DC | PRN

## 2014-07-07 NOTE — Patient Instructions (Signed)
Metered Dose Inhaler (No Spacer Used)  Inhaled medicines are the basis of treatment for asthma and other breathing problems. Inhaled medicine can only be effective if used properly. Good technique assures that the medicine reaches the lungs.  Metered dose inhalers (MDIs) are used to deliver a variety of inhaled medicines. These include quick relief or rescue medicines (such as bronchodilators) and controller medicines (such as corticosteroids). The medicine is delivered by pushing down on a metal canister to release a set amount of spray.  If you are using different kinds of inhalers, use your quick relief medicine to open the airways 10-15 minutes before using a steroid, if instructed to do so by your health care provider. If you are unsure which inhalers to use and the order of using them, ask your health care provider, nurse, or respiratory therapist.  HOW TO USE THE INHALER  1. Remove the cap from the inhaler.  2. If you are using the inhaler for the first time, you will need to prime it. Shake the inhaler for 5 seconds and release four puffs into the air, away from your face. Ask your health care provider or pharmacist if you have questions about priming your inhaler.  3. Shake the inhaler for 5 seconds before each breath in (inhalation).  4. Position the inhaler so that the top of the canister faces up.  5. Put your index finger on the top of the medicine canister. Your thumb supports the bottom of the inhaler.  6. Open your mouth.  7. Either place the inhaler between your teeth and place your lips tightly around the mouthpiece, or hold the inhaler 1-2 inches away from your open mouth. If you are unsure of which technique to use, ask your health care provider.  8. Breathe out (exhale) normally and as completely as possible.  9. Press the canister down with the index finger to release the medicine.  10. At the same time as the canister is pressed, inhale deeply and slowly until your lungs are completely filled.  This should take 4-6 seconds. Keep your tongue down.  11. Hold the medicine in your lungs for 5-10 seconds (10 seconds is best). This helps the medicine get into the small airways of your lungs.  12. Breathe out slowly, through pursed lips. Whistling is an example of pursed lips.  13. Wait at least 1 minute between puffs. Continue with the above steps until you have taken the number of puffs your health care provider has ordered. Do not use the inhaler more than your health care provider directs you to.  14. Replace the cap on the inhaler.  15. Follow the directions from your health care provider or the inhaler insert for cleaning the inhaler.  If you are using a steroid inhaler, after your last puff, rinse your mouth with water, gargle, and spit out the water. Do not swallow the water.  AVOID:  · Inhaling before or after starting the spray of medicine. It takes practice to coordinate your breathing with triggering the spray.  · Inhaling through the nose (rather than the mouth) when triggering the spray.  HOW TO DETERMINE IF YOUR INHALER IS FULL OR NEARLY EMPTY  You cannot know when an inhaler is empty by shaking it. Some inhalers are now being made with dose counters. Ask your health care provider for a prescription that has a dose counter if you feel you need that extra help. If your inhaler does not have a counter, ask your health care   provider to help you determine the date you need to refill your inhaler. Write the refill date on a calendar or your inhaler canister. Refill your inhaler 7-10 days before it runs out. Be sure to keep an adequate supply of medicine. This includes making sure it has not expired, and making sure you have a spare inhaler.  SEEK MEDICAL CARE IF:  · Symptoms are only partially relieved with your inhaler.  · You are having trouble using your inhaler.  · You experience an increase in phlegm.  SEEK IMMEDIATE MEDICAL CARE IF:  · You feel little or no relief with your inhalers. You are still  wheezing and feeling shortness of breath, tightness in your chest, or both.  · You have dizziness, headaches, or a fast heart rate.  · You have chills, fever, or night sweats.  · There is a noticeable increase in phlegm production, or there is blood in the phlegm.  MAKE SURE YOU:  · Understand these instructions.  · Will watch your condition.  · Will get help right away if you are not doing well or get worse.  Document Released: 06/20/2007 Document Revised: 01/07/2014 Document Reviewed: 02/08/2013  [employer]® Patient Information ©2015 [employer]. This information is not intended to replace advice given to you by your health care provider. Make sure you discuss any questions you have with your health care provider.

## 2014-07-07 NOTE — Progress Notes (Signed)
Called in.

## 2014-07-07 NOTE — Progress Notes (Signed)
[facility] [address], [place] [address] [phone]  Date:  07/07/2014   Name:  [name]   DOB:  08/04/1956   MRN:  [id number]  PCP:  Precious Reel, MD    Chief Complaint: Cough and Facial Pain   History of Present Illness:  [name] is a 58 y.o. very pleasant male patient who presents with the following:  Ill for 1 1/2 weeks with a sinus infection.  Started on cefdinir and has one day left.  Now has brown nasal discharge and a nasal congestion Cough worse at night with purulent sputum No wheezing or shortness of breath. No fever or chills.   No improvement with over the counter medications or other home remedies.  Denies other complaint or health concern today.   Patient Active Problem List   Diagnosis Date Noted  . Pulmonary embolism 01/30/2013  . Sarcoidosis 01/30/2013  . Hemoptysis 01/30/2013  . SHOULDER PAIN, LEFT 11/28/2009    Past Medical History  Diagnosis Date  . Pancreatitis   . Hypertension   . Sarcoidosis   . Hyperlipidemia     Past Surgical History  Procedure Laterality Date  . Cholecystectomy    . Shoulder surgery    . Back surgery    . Abdominal exploration surgery      History  Substance Use Topics  . Smoking status: Former Smoker    Types: Cigarettes  . Smokeless tobacco: Not on file  . Alcohol Use: No    Family History  Problem Relation Age of Onset  . Hypertension Father     Allergies  Allergen Reactions  . Codeine     REACTION: Reaction not known  . Hydromorphone Other (See Comments)    hallucinations     Medication list has been reviewed and updated.  Current Outpatient Prescriptions on File Prior to Visit  Medication Sig Dispense Refill  . aspirin 81 MG tablet Take 1 tablet (81 mg total) by mouth daily.    Marland Kitchen diltiazem (CARDIZEM CD) 360 MG 24 hr capsule Take 360 mg by mouth daily.    . enalapril (VASOTEC) 5 MG tablet Take 5 mg by mouth daily.    Marland Kitchen labetalol (NORMODYNE) 300 MG  tablet Take 300 mg by mouth 3 (three) times daily.    Marland Kitchen olmesartan (BENICAR) 40 MG tablet Take 40 mg by mouth daily.    . ondansetron (ZOFRAN) 4 MG tablet Take 1 tablet (4 mg total) by mouth every 6 (six) hours. 12 tablet 0  . predniSONE (DELTASONE) 5 MG tablet Take 5 mg by mouth daily.    Marland Kitchen atorvastatin (LIPITOR) 10 MG tablet Take 1 tablet (10 mg total) by mouth daily. 30 tablet 11  . doxazosin (CARDURA) 4 MG tablet Take 1 tablet (4 mg total) by mouth at bedtime. 30 tablet 11  . oxyCODONE-acetaminophen (PERCOCET/ROXICET) 5-325 MG per tablet Take 1 tablet by mouth every 4 (four) hours as needed for pain. 15 tablet 0  . Rivaroxaban (XARELTO) 20 MG TABS Take 20 mg by mouth daily.    Marland Kitchen zolpidem (AMBIEN) 10 MG tablet Take 5 mg by mouth at bedtime as needed for sleep.     No current facility-administered medications on file prior to visit.    Review of Systems:  As per HPI, otherwise negative.    Physical Examination: Filed Vitals:   07/07/14 1001  BP: 138/82  Pulse: 82  Temp: 98.7 F (37.1 C)  Resp: 18   Filed  Vitals:   07/07/14 1001  Height: 5\' 11"  (1.803 m)  Weight: 264 lb (119.75 kg)   Body mass index is 36.84 kg/(m^2). Ideal Body Weight: Weight in (lb) to have BMI = 25: 178.9    GEN: WDWN, NAD, Non-toxic, Alert & Oriented x 3 HEENT: Atraumatic, Normocephalic. Oropharynx clear. Ears and Nose: No external deformity.  TM negative.  Swollen and erythematous nasal mucosa EXTR: No clubbing/cyanosis/edema NEURO: Normal gait.  PSYCH: Normally interactive. Conversant. Not depressed or anxious appearing.  Calm demeanor.  CHEST:  BBS=.  Scattered faint wheezing  Assessment and Plan: Sinusitis Bronchitis with bronchospasm Albuterol levaquin  Signed,  Ellison Carwin, MD

## 2014-11-02 ENCOUNTER — Ambulatory Visit (INDEPENDENT_AMBULATORY_CARE_PROVIDER_SITE_OTHER): Payer: BLUE CROSS/BLUE SHIELD | Admitting: Family Medicine

## 2014-11-02 VITALS — BP 140/90 | HR 80 | Temp 97.9°F | Resp 16 | Ht 72.0 in | Wt 274.0 lb

## 2014-11-02 DIAGNOSIS — S61011A Laceration without foreign body of right thumb without damage to nail, initial encounter: Secondary | ICD-10-CM

## 2014-11-02 DIAGNOSIS — Z23 Encounter for immunization: Secondary | ICD-10-CM

## 2014-11-02 DIAGNOSIS — S61219A Laceration without foreign body of unspecified finger without damage to nail, initial encounter: Secondary | ICD-10-CM

## 2014-11-02 NOTE — Progress Notes (Addendum)
° °  Subjective:    Patient ID: [name], male    DOB: 09-19-1955, 59 y.o.   MRN: [id number] This chart was scribed for Robyn Haber, MD by Zola Button, Medical Scribe. This patient was seen in Room 7 and the patient's care was started at 9:06 AM.   HPI HPI Comments: [name] is a 59 y.o. male who presents to the [facility] complaining of a laceration to his right thumb due to an injury 2 days ago. Patient reports that he accidentally cut his thumb with a knife while cutting vegetables. He notes that there is still some bleeding. He is unsure when his last tetanus shot was and is unsure about his tetanus status.  Patient works at [employer].  Review of Systems  Skin: Positive for wound.       Objective:   Physical Exam CONSTITUTIONAL: Well developed/well nourished HEAD: Normocephalic/atraumatic EYES: EOM/PERRL ENMT: Mucous membranes moist NECK: supple no meningeal signs SPINE: entire spine nontender CV: S1/S2 noted, no murmurs/rubs/gallops noted LUNGS: Lungs are clear to auscultation bilaterally, no apparent distress ABDOMEN: soft, nontender, no rebound or guarding GU: no cva tenderness NEURO: Pt is awake/alert, moves all extremitiesx4 EXTREMITIES: pulses normal, full ROM SKIN: warm, color normal,  1.5 cm thumb laceration ulnar side of distal phalanx without erythema or drainage. PSYCH: no abnormalities of mood noted   Wound dermabonded.     Assessment & Plan:   This chart was scribed in my presence and reviewed by me personally.    ICD-9-CM ICD-10-CM   1. Need for Tdap vaccination V06.1 Z23 Tdap vaccine greater than or equal to 7yo IM  2. Laceration of finger, initial encounter 883.0 S61.219A Tdap vaccine greater than or equal to 7yo IM     Signed, Robyn Haber, MD

## 2014-11-27 ENCOUNTER — Other Ambulatory Visit: Payer: Self-pay | Admitting: Orthopaedic Surgery

## 2014-11-27 DIAGNOSIS — M5106 Intervertebral disc disorders with myelopathy, lumbar region: Secondary | ICD-10-CM

## 2014-12-18 ENCOUNTER — Ambulatory Visit
Admission: RE | Admit: 2014-12-18 | Discharge: 2014-12-18 | Disposition: A | Discharge status: Home / Self Care | Payer: Self-pay | Source: Ambulatory Visit | Attending: Orthopaedic Surgery | Admitting: Orthopaedic Surgery

## 2014-12-18 DIAGNOSIS — M5106 Intervertebral disc disorders with myelopathy, lumbar region: Secondary | ICD-10-CM

## 2015-02-06 ENCOUNTER — Ambulatory Visit: Payer: BLUE CROSS/BLUE SHIELD

## 2015-02-07 ENCOUNTER — Ambulatory Visit (INDEPENDENT_AMBULATORY_CARE_PROVIDER_SITE_OTHER): Payer: BLUE CROSS/BLUE SHIELD | Admitting: Emergency Medicine

## 2015-02-07 VITALS — BP 138/90 | HR 79 | Temp 98.3°F | Resp 14 | Ht 71.25 in | Wt 260.4 lb

## 2015-02-07 DIAGNOSIS — I10 Essential (primary) hypertension: Secondary | ICD-10-CM

## 2015-02-07 LAB — COMPREHENSIVE METABOLIC PANEL
ALT: 26 U/L (ref 0–53)
AST: 19 U/L (ref 0–37)
Albumin: 4.2 g/dL (ref 3.5–5.2)
Alkaline Phosphatase: 81 U/L (ref 39–117)
BUN: 11 mg/dL (ref 6–23)
CO2: 30 mEq/L (ref 19–32)
Calcium: 9.8 mg/dL (ref 8.4–10.5)
Chloride: 104 mEq/L (ref 96–112)
Creat: 1.58 mg/dL — ABNORMAL HIGH (ref 0.50–1.35)
Glucose, Bld: 123 mg/dL — ABNORMAL HIGH (ref 70–99)
Potassium: 3.8 mEq/L (ref 3.5–5.3)
Sodium: 144 mEq/L (ref 135–145)
Total Bilirubin: 0.5 mg/dL (ref 0.2–1.2)
Total Protein: 7 g/dL (ref 6.0–8.3)

## 2015-02-07 LAB — CBC
HCT: 45.3 % (ref 39.0–52.0)
Hemoglobin: 15.9 g/dL (ref 13.0–17.0)
MCH: 29.4 pg (ref 26.0–34.0)
MCHC: 35.1 g/dL (ref 30.0–36.0)
MCV: 83.9 fL (ref 78.0–100.0)
MPV: 9.2 fL (ref 8.6–12.4)
Platelets: 290 10*3/uL (ref 150–400)
RBC: 5.4 MIL/uL (ref 4.22–5.81)
RDW: 15.8 % — ABNORMAL HIGH (ref 11.5–15.5)
WBC: 9.1 10*3/uL (ref 4.0–10.5)

## 2015-02-07 LAB — TSH: TSH: 0.883 u[IU]/mL (ref 0.350–4.500)

## 2015-02-07 LAB — LIPID PANEL
Cholesterol: 209 mg/dL — ABNORMAL HIGH (ref 0–200)
HDL: 41 mg/dL (ref >=40)
LDL Cholesterol: 112 mg/dL — ABNORMAL HIGH (ref 0–99)
Total CHOL/HDL Ratio: 5.1 Ratio
Triglycerides: 280 mg/dL — ABNORMAL HIGH (ref <150)
VLDL: 56 mg/dL — ABNORMAL HIGH (ref 0–40)

## 2015-02-07 NOTE — Progress Notes (Signed)
Subjective:  Patient ID: [name], male    DOB: 1956/01/17  Age: 59 y.o. 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 He underwent a DOT physical on April and was certified for only 90 days due to hypertension. He's come in today asking for renewal of his DOT physical. Not the provider that did his initial examination, able to extend his car. I explained to him that he would need to have a complete start over DOT exam. He elected to wait until July to complete his examination with chelle Saran Laviolette PA-C.  He is under treatment by family doctor for hypertension his wife was concerned these is been disoriented this morning after taking Ambien last night for sleep area disease pending a sleep study for evaluation of sleep apnea.  He denies any improvement with over-the-counter medication. Denies any other acute concerns.  Outpatient Prescriptions Prior to Visit  Medication Sig Dispense Refill  . aspirin 81 MG tablet Take 1 tablet (81 mg total) by mouth daily.    Marland Kitchen diltiazem (CARDIZEM CD) 360 MG 24 hr capsule Take 360 mg by mouth daily.    . enalapril (VASOTEC) 5 MG tablet Take 5 mg by mouth daily.    Marland Kitchen labetalol (NORMODYNE) 300 MG tablet Take 300 mg by mouth 3 (three) times daily.    Marland Kitchen olmesartan (BENICAR) 40 MG tablet Take 40 mg by mouth daily.    . predniSONE (DELTASONE) 5 MG tablet Take 5 mg by mouth daily.    Marland Kitchen zolpidem (AMBIEN) 10 MG tablet Take 5 mg by mouth at bedtime as needed for sleep.    Marland Kitchen doxazosin (CARDURA) 4 MG tablet Take 1 tablet (4 mg total) by mouth at bedtime. 30 tablet 11  . albuterol (PROVENTIL HFA;VENTOLIN HFA) 108 (90 BASE) MCG/ACT inhaler Inhale 2 puffs into the lungs every 4 (four) hours as needed for wheezing or shortness of breath (cough, shortness of breath or wheezing.). (Patient not taking: Reported on 02/07/2015) 1 Inhaler 1  . atorvastatin (LIPITOR) 10 MG  tablet Take 1 tablet (10 mg total) by mouth daily. (Patient not taking: Reported on 02/07/2015) 30 tablet 11  . oxyCODONE-acetaminophen (PERCOCET/ROXICET) 5-325 MG per tablet Take 1 tablet by mouth every 4 (four) hours as needed for pain. (Patient not taking: Reported on 11/02/2014) 15 tablet 0  . Rivaroxaban (XARELTO) 20 MG TABS Take 20 mg by mouth daily.     No facility-administered medications prior to visit.    History   Social History  . Marital Status: Married    Spouse Name: N/A  . Number of Children: N/A  . Years of Education: N/A   Social History Main Topics  . Smoking status: Former Smoker    Types: Cigarettes  . Smokeless tobacco: Not on file  . Alcohol Use: No  . Drug Use: No  . Sexual Activity: Yes   Other Topics Concern  . None   Social History Narrative    Family History  Problem Relation Age of Onset  . Hypertension Father     Past Medical History  Diagnosis Date  . Pancreatitis   . Hypertension   . Sarcoidosis   . Hyperlipidemia      Review of Systems  Constitutional: Negative for fever, chills and appetite change.  HENT: Negative for congestion, ear pain, postnasal drip, sinus pressure and sore throat.   Eyes: Negative for pain and redness.  Respiratory: Negative for cough, shortness of breath and wheezing.   Cardiovascular: Negative for leg swelling.  Gastrointestinal: Negative for nausea, vomiting, abdominal pain, diarrhea, constipation and blood in stool.  Endocrine: Negative for polyuria.  Genitourinary: Negative for dysuria, urgency, frequency and flank pain.  Musculoskeletal: Negative for gait problem.  Skin: Negative for rash.  Neurological: Negative for weakness and headaches.  Psychiatric/Behavioral: Negative for confusion and decreased concentration. The patient is not nervous/anxious.     Objective:  BP 138/90 mmHg  Pulse 79  Temp(Src) 98.3 F (36.8 C) (Oral)  Resp 14  Ht 5' 11.25" (1.81 m)  Wt 260 lb 6.4 oz (118.117 kg)  BMI  36.05 kg/m2  SpO2 96%  BP Readings from Last 3 Encounters:  02/07/15 138/90  11/02/14 140/90  07/07/14 138/82    Wt Readings from Last 3 Encounters:  02/07/15 260 lb 6.4 oz (118.117 kg)  12/18/14 270 lb (122.471 kg)  11/02/14 274 lb (124.286 kg)    Physical Exam  Constitutional: He is oriented to person, place, and time. He appears well-developed and well-nourished.  HENT:  Head: Normocephalic and atraumatic.  Eyes: Conjunctivae are normal. Pupils are equal, round, and reactive to light.  Pulmonary/Chest: Effort normal.  Musculoskeletal: He exhibits no edema.  Neurological: He is alert and oriented to person, place, and time.  Skin: Skin is dry.  Psychiatric: He has a normal mood and affect. His behavior is normal. Thought content normal.     .  Assessment & Plan:   Rithvik was seen today for blood pressure check, medication consult and medication refill.  Diagnoses and all orders for this visit:  Essential hypertension Orders: -     CBC -     Comprehensive metabolic panel -     Lipid panel -     TSH   I have discontinued Mr. Beezley's doxazosin. I am also having him maintain his predniSONE, olmesartan, enalapril, labetalol, atorvastatin, diltiazem, aspirin, rivaroxaban, zolpidem, oxyCODONE-acetaminophen, albuterol, and Furosemide (LASIX PO).  Meds ordered this encounter  Medications  . Furosemide (LASIX PO)    Sig: Take by mouth.    Appropriate red flag conditions were discussed with the patient as well as actions that should be taken.  Patient expressed his understanding.   Labs were drawn usual medications and follow up with his regular doctor I suggested that he stop taking Ambien and switch to another drug that doesn't have the daytime sedation problem as Ambien.  Follow-up: Return if symptoms worsen or fail to improve.  Roselee Culver, MD

## 2015-02-07 NOTE — Patient Instructions (Signed)

## 2015-04-07 ENCOUNTER — Telehealth: Payer: Self-pay

## 2015-04-07 ENCOUNTER — Institutional Professional Consult (permissible substitution): Payer: BLUE CROSS/BLUE SHIELD | Admitting: Neurology

## 2015-04-07 NOTE — Telephone Encounter (Signed)
Pt did not show for their appt with Dr. Dohmeier today.  

## 2015-04-10 ENCOUNTER — Encounter: Payer: Self-pay | Admitting: Neurology

## 2015-05-08 ENCOUNTER — Ambulatory Visit: Payer: Self-pay

## 2015-05-08 ENCOUNTER — Other Ambulatory Visit: Payer: Self-pay | Admitting: Occupational Medicine

## 2015-05-08 DIAGNOSIS — M549 Dorsalgia, unspecified: Secondary | ICD-10-CM

## 2015-05-29 ENCOUNTER — Other Ambulatory Visit: Payer: Self-pay | Admitting: Internal Medicine

## 2015-05-29 DIAGNOSIS — N200 Calculus of kidney: Secondary | ICD-10-CM

## 2015-05-30 ENCOUNTER — Ambulatory Visit
Admission: RE | Admit: 2015-05-30 | Discharge: 2015-05-30 | Disposition: A | Discharge status: Home / Self Care | Payer: BLUE CROSS/BLUE SHIELD | Source: Ambulatory Visit | Attending: Internal Medicine | Admitting: Internal Medicine

## 2015-05-30 DIAGNOSIS — N200 Calculus of kidney: Secondary | ICD-10-CM

## 2015-11-02 ENCOUNTER — Ambulatory Visit (INDEPENDENT_AMBULATORY_CARE_PROVIDER_SITE_OTHER): Payer: BLUE CROSS/BLUE SHIELD

## 2015-11-02 ENCOUNTER — Ambulatory Visit (INDEPENDENT_AMBULATORY_CARE_PROVIDER_SITE_OTHER): Payer: BLUE CROSS/BLUE SHIELD | Admitting: Family Medicine

## 2015-11-02 VITALS — BP 148/100 | HR 84 | Temp 99.9°F | Resp 24 | Ht 72.0 in | Wt 257.4 lb

## 2015-11-02 DIAGNOSIS — R509 Fever, unspecified: Secondary | ICD-10-CM | POA: Diagnosis not present

## 2015-11-02 DIAGNOSIS — D869 Sarcoidosis, unspecified: Secondary | ICD-10-CM

## 2015-11-02 DIAGNOSIS — J189 Pneumonia, unspecified organism: Secondary | ICD-10-CM | POA: Diagnosis not present

## 2015-11-02 LAB — POCT CBC
Granulocyte percent: 86.7 %G — AB (ref 37–80)
HCT, POC: 43.5 % (ref 43.5–53.7)
Hemoglobin: 15.3 g/dL (ref 14.1–18.1)
Lymph, poc: 1 (ref 0.6–3.4)
MCH, POC: 31.4 pg — AB (ref 27–31.2)
MCHC: 35.1 g/dL (ref 31.8–35.4)
MCV: 89.4 fL (ref 80–97)
MID (cbc): 0.5 (ref 0–0.9)
MPV: 6.8 fL (ref 0–99.8)
POC Granulocyte: 10 — AB (ref 2–6.9)
POC LYMPH PERCENT: 8.6 %L — AB (ref 10–50)
POC MID %: 4.7 %M (ref 0–12)
Platelet Count, POC: 246 10*3/uL (ref 142–424)
RBC: 4.86 M/uL (ref 4.69–6.13)
RDW, POC: 14.3 %
WBC: 11.5 10*3/uL — AB (ref 4.6–10.2)

## 2015-11-02 LAB — POCT SEDIMENTATION RATE: POCT SED RATE: 31 mm/hr — AB (ref 0–22)

## 2015-11-02 LAB — POCT INFLUENZA A/B
Influenza A, POC: NEGATIVE
Influenza B, POC: NEGATIVE

## 2015-11-02 MED ORDER — LEVOFLOXACIN 500 MG PO TABS: 500.0000 mg | ORAL_TABLET | Freq: Every day | ORAL | Status: DC

## 2015-11-02 MED ORDER — IPRATROPIUM BROMIDE 0.02 % IN SOLN
0.5000 mg | Freq: Once | RESPIRATORY_TRACT | Status: AC
  Administered 2015-11-02: 0.5 mg via RESPIRATORY_TRACT

## 2015-11-02 MED ORDER — AEROCHAMBER PLUS W/MASK SMALL MISC: 1.0000 | Freq: Once | Status: DC

## 2015-11-02 MED ORDER — ALBUTEROL SULFATE HFA 108 (90 BASE) MCG/ACT IN AERS: 2.0000 | INHALATION_SPRAY | RESPIRATORY_TRACT | Status: DC | PRN

## 2015-11-02 MED ORDER — GUAIFENESIN ER 1200 MG PO TB12: 1.0000 | ORAL_TABLET | Freq: Two times a day (BID) | ORAL | Status: DC | PRN

## 2015-11-02 MED ORDER — PREDNISONE 20 MG PO TABS: 40.0000 mg | ORAL_TABLET | Freq: Every day | ORAL | Status: DC

## 2015-11-02 MED ORDER — ALBUTEROL SULFATE (2.5 MG/3ML) 0.083% IN NEBU
2.5000 mg | INHALATION_SOLUTION | Freq: Once | RESPIRATORY_TRACT | Status: AC
  Administered 2015-11-02: 2.5 mg via RESPIRATORY_TRACT

## 2015-11-02 NOTE — Progress Notes (Signed)
Subjective:    Patient ID: [name], male    DOB: [birth year], 60 y.o.   MRN: [id number] By signing my name below, I, Judithe Modest, attest that this documentation has been prepared under the direction and in the presence of Delman Cheadle, MD. Electronically Signed: Judithe Modest, ER Scribe. 11/02/2015. 9:52 AM.  Chief Complaint  Patient presents with  . Headache    x 2 days  . Sore Throat  . Fever  . Generalized Body Aches  . Chills    HPI  HPI Comments: [name] is a 60 y.o. male who presents to [facility] complaining of ear pain, HA, sinus congestion, sore throat, and mild cough for the last two weeks. He denies wheezing or chest pain. He was improving but then two days ago his sx started worsening again. He was prescribed a zpac two weeks ago for his sx. He has had intermittent fevers. He took ibuprofen last night. He got a flu shot this year. His fever was 102.5 last night. He has a past smoking history. No past hx of asthma. He does not have an inhaler at home.   He takes prednisone daily for sarcoidosis.   Past Medical History  Diagnosis Date  . Pancreatitis   . Hypertension   . Sarcoidosis ([place])   . Hyperlipidemia   . Back pain   . Diastolic dysfunction   . ED (erectile dysfunction)   . Long-term use of high-risk medication   . PE (pulmonary embolism)   . CKD (chronic kidney disease), stage III   . Smoker   . Obesity   . CVA (cerebral infarction)   . Nephrolithiasis   . Osteopenia   . Proteinuria   . Insomnia   . GERD (gastroesophageal reflux disease)   . Hypercalcemia   . Nephrocalcinosis    Allergies  Allergen Reactions  . Codeine     REACTION: Reaction not known  . Hydromorphone Other (See Comments)    hallucinations    Current Outpatient Prescriptions on File Prior to Visit  Medication Sig Dispense Refill  . aspirin 81 MG tablet Take 1 tablet (81 mg total) by mouth daily.    Marland Kitchen diltiazem (CARDIZEM CD) 360 MG 24 hr capsule Take 360 mg by  mouth daily.    Marland Kitchen labetalol (NORMODYNE) 300 MG tablet Take 300 mg by mouth 3 (three) times daily.    Marland Kitchen olmesartan (BENICAR) 40 MG tablet Take 40 mg by mouth daily.    . predniSONE (DELTASONE) 5 MG tablet Take 5 mg by mouth daily. Reported on 11/02/2015    . zolpidem (AMBIEN) 10 MG tablet Take 5 mg by mouth at bedtime as needed for sleep.    Marland Kitchen albuterol (PROVENTIL HFA;VENTOLIN HFA) 108 (90 BASE) MCG/ACT inhaler Inhale 2 puffs into the lungs every 4 (four) hours as needed for wheezing or shortness of breath (cough, shortness of breath or wheezing.). (Patient not taking: Reported on 02/07/2015) 1 Inhaler 1  . atorvastatin (LIPITOR) 10 MG tablet Take 1 tablet (10 mg total) by mouth daily. (Patient not taking: Reported on 02/07/2015) 30 tablet 11  . busPIRone (BUSPAR) 7.5 MG tablet Take 7.5 mg by mouth every morning. Reported on 11/02/2015  2  . diazepam (VALIUM) 5 MG tablet Reported on 11/02/2015  0  . enalapril (VASOTEC) 10 MG tablet Take 10 mg by mouth daily. Reported on 11/02/2015  5  . furosemide (LASIX) 20 MG tablet Reported on 11/02/2015  3  . gabapentin (NEURONTIN) 300  MG capsule Reported on 11/02/2015  1  . oxyCODONE-acetaminophen (PERCOCET/ROXICET) 5-325 MG per tablet Take 1 tablet by mouth every 4 (four) hours as needed for pain. (Patient not taking: Reported on 11/02/2014) 15 tablet 0  . Rivaroxaban (XARELTO) 20 MG TABS Take 20 mg by mouth daily. Reported on 11/02/2015    . traMADol (ULTRAM) 50 MG tablet Reported on 11/02/2015  0  . vardenafil (LEVITRA) 10 MG tablet Take 10 mg by mouth as needed for erectile dysfunction. Reported on 11/02/2015     No current facility-administered medications on file prior to visit.    Review of Systems  Constitutional: Positive for fever, chills, diaphoresis, appetite change and fatigue. Negative for unexpected weight change.  HENT: Positive for congestion, ear pain, postnasal drip, rhinorrhea, sinus pressure and sore throat.   Respiratory: Positive for cough.  Negative for shortness of breath and wheezing.   Cardiovascular: Positive for leg swelling. Negative for chest pain.  Gastrointestinal: Negative for vomiting.  Musculoskeletal: Positive for myalgias and arthralgias.  Skin: Negative for rash.  Neurological: Positive for headaches.  Hematological: Positive for adenopathy.  Psychiatric/Behavioral: Positive for sleep disturbance.      Objective:  BP 148/100 mmHg  Pulse 84  Temp(Src) 99.9 F (37.7 C) (Oral)  Resp 24  Ht 6' (1.829 m)  Wt 257 lb 6.4 oz (116.756 kg)  BMI 34.90 kg/m2  SpO2 93%  Physical Exam  Constitutional: He is oriented to person, place, and time. He appears well-developed and well-nourished. No distress.  HENT:  Head: Normocephalic and atraumatic.  Bilateral TMs with bulging and erythema. Nasal edema and congestion with mucopurulent rhinorrhea. Oropharynx with erythema and postnasal drip.   Eyes: Pupils are equal, round, and reactive to light.  Neck: Neck supple.  Cardiovascular: Normal rate.   Pulmonary/Chest: Effort normal. No respiratory distress.  Decreased breath sounds throughout. Right upper lobe expiratory wheezing and left upper lobe inspiratory rales.   Musculoskeletal: Normal range of motion.  Neurological: He is alert and oriented to person, place, and time. Coordination normal.  Skin: Skin is warm and dry. He is not diaphoretic.  Psychiatric: He has a normal mood and affect. His behavior is normal.  Nursing note and vitals reviewed. Sig improvement in air mvmnt and sxs after neb trx in office    Dg Chest 2 View  11/02/2015  CLINICAL DATA:  Patient with fever and cough.  History of sarcoid. EXAM: CHEST  2 VIEW COMPARISON:  Chest radiograph 02/01/2013. FINDINGS: Stable enlarged cardiac and mediastinal contours. Consolidative airspace opacities within the right upper lobe. Minimal atelectasis bilateral lung bases. No pleural effusion or pneumothorax. IMPRESSION: Suggestion of focal consolidation within the  right upper lobe which is nonspecific however may be infectious or inflammatory in etiology. Followup PA and lateral chest X-ray is recommended in 3-4 weeks following trial of antibiotic therapy to ensure resolution and exclude underlying malignancy. Electronically Signed   By: Lovey Newcomer M.D.   On: 11/02/2015 10:46   Results for orders placed or performed in visit on 11/02/15  POCT CBC  Result Value Ref Range   WBC 11.5 (A) 4.6 - 10.2 K/uL   Lymph, poc 1.0 0.6 - 3.4   POC LYMPH PERCENT 8.6 (A) 10 - 50 %L   MID (cbc) 0.5 0 - 0.9   POC MID % 4.7 0 - 12 %M   POC Granulocyte 10.0 (A) 2 - 6.9   Granulocyte percent 86.7 (A) 37 - 80 %G   RBC 4.86 4.69 - 6.13 M/uL  Hemoglobin 15.3 14.1 - 18.1 g/dL   HCT, POC 43.5 43.5 - 53.7 %   MCV 89.4 80 - 97 fL   MCH, POC 31.4 (A) 27 - 31.2 pg   MCHC 35.1 31.8 - 35.4 g/dL   RDW, POC 14.3 %   Platelet Count, POC 246 142 - 424 K/uL   MPV 6.8 0 - 99.8 fL  POCT SEDIMENTATION RATE  Result Value Ref Range   POCT SED RATE 31 (A) 0 - 22 mm/hr  POCT Influenza A/B  Result Value Ref Range   Influenza A, POC Negative Negative   Influenza B, POC Negative Negative    Assessment & Plan:   1. Fever, unspecified   2. Sarcoidosis ([place])   3. CAP (community acquired pneumonia)   Recheck in 3d, sooner if worse.  Advised to f/u for repeat CXR in 4 wks to assess for underlying scarring.   Orders Placed This Encounter  Procedures  . DG Chest 2 View    Standing Status: Future     Number of Occurrences: 1     Standing Expiration Date: 11/01/2016    Order Specific Question:  Reason for Exam (SYMPTOM  OR DIAGNOSIS REQUIRED)    Answer:  fever and cough with sarcoid, concern for pneumonia    Order Specific Question:  Preferred imaging location?    Answer:  External  . POCT CBC  . POCT SEDIMENTATION RATE  . POCT Influenza A/B    Meds ordered this encounter  Medications  . hydrALAZINE (APRESOLINE) 25 MG tablet    Sig: Take 25 mg by mouth 2 (two) times daily.  .  hydrochlorothiazide (HYDRODIURIL) 12.5 MG tablet    Sig: Take 12.5 mg by mouth daily.  Marland Kitchen albuterol (PROVENTIL) (2.5 MG/3ML) 0.083% nebulizer solution 2.5 mg    Sig:   . ipratropium (ATROVENT) nebulizer solution 0.5 mg    Sig:   . levofloxacin (LEVAQUIN) 500 MG tablet    Sig: Take 1 tablet (500 mg total) by mouth daily.    Dispense:  7 tablet    Refill:  0  . DISCONTD: albuterol (PROVENTIL HFA;VENTOLIN HFA) 108 (90 Base) MCG/ACT inhaler    Sig: Inhale 2 puffs into the lungs every 4 (four) hours as needed for wheezing or shortness of breath (cough, shortness of breath or wheezing.).    Dispense:  1 Inhaler    Refill:  1  . Spacer/Aero-Holding Chambers (AEROCHAMBER PLUS WITH MASK- SMALL) MISC    Sig: 1 each by Other route once.    Dispense:  1 each    Refill:  0    Please dispense any type of spacer that works with the albuterol being rxed. Please demonstrate on use to pt.  . predniSONE (DELTASONE) 20 MG tablet    Sig: Take 2 tablets (40 mg total) by mouth daily with breakfast.    Dispense:  10 tablet    Refill:  0  . albuterol (PROVENTIL HFA;VENTOLIN HFA) 108 (90 Base) MCG/ACT inhaler    Sig: Inhale 2 puffs into the lungs every 4 (four) hours as needed for wheezing or shortness of breath (cough, shortness of breath or wheezing.).    Dispense:  1 Inhaler    Refill:  1  . Guaifenesin (MUCINEX MAXIMUM STRENGTH) 1200 MG TB12    Sig: Take 1 tablet (1,200 mg total) by mouth every 12 (twelve) hours as needed.    Dispense:  14 tablet    Refill:  0    I personally performed the services  described in this documentation, which was scribed in my presence. The recorded information has been reviewed and considered, and addended by me as needed.  Delman Cheadle, MD MPH

## 2015-11-02 NOTE — Patient Instructions (Signed)

## 2015-11-03 ENCOUNTER — Telehealth: Payer: Self-pay

## 2015-11-03 NOTE — Telephone Encounter (Signed)
Patient called into clinic today stating that he needs a work note to be out of work from 11/02/15 through 11/08/15 due to illness he was seen for on 11/02/15 he stated he forgot to ask Dr Brigitte Pulse for this note. Also he wanted to let her know that he will be going to see his PCP on Thursday 11/06/15 it was the earliest he could get in.  Please give him a call at [phone] when his note is ready to be picked up.

## 2015-11-04 NOTE — Telephone Encounter (Signed)
Yes, fine for note and fine to wait until f/u for then as long as he is starting to feel better. If worse or no imprvement, come in here sooner.

## 2015-11-04 NOTE — Telephone Encounter (Signed)
Ok for note 

## 2015-11-05 NOTE — Telephone Encounter (Signed)
Note ready to pick up. Pt advised on VM.

## 2015-12-29 ENCOUNTER — Other Ambulatory Visit: Payer: Self-pay | Admitting: Sports Medicine

## 2015-12-30 ENCOUNTER — Other Ambulatory Visit: Payer: Self-pay | Admitting: Sports Medicine

## 2015-12-30 DIAGNOSIS — M25512 Pain in left shoulder: Secondary | ICD-10-CM

## 2016-01-03 ENCOUNTER — Ambulatory Visit
Admission: RE | Admit: 2016-01-03 | Discharge: 2016-01-03 | Disposition: A | Discharge status: Home / Self Care | Payer: BLUE CROSS/BLUE SHIELD | Source: Ambulatory Visit | Attending: Sports Medicine | Admitting: Sports Medicine

## 2016-01-03 DIAGNOSIS — M25512 Pain in left shoulder: Secondary | ICD-10-CM

## 2016-02-04 ENCOUNTER — Other Ambulatory Visit: Payer: Self-pay | Admitting: Internal Medicine

## 2016-02-04 DIAGNOSIS — N281 Cyst of kidney, acquired: Secondary | ICD-10-CM

## 2016-02-27 ENCOUNTER — Ambulatory Visit
Admission: RE | Admit: 2016-02-27 | Discharge: 2016-02-27 | Disposition: A | Discharge status: Home / Self Care | Payer: BLUE CROSS/BLUE SHIELD | Source: Ambulatory Visit | Attending: Internal Medicine | Admitting: Internal Medicine

## 2016-02-27 DIAGNOSIS — N281 Cyst of kidney, acquired: Secondary | ICD-10-CM

## 2016-03-03 ENCOUNTER — Other Ambulatory Visit: Payer: Self-pay | Admitting: Internal Medicine

## 2016-03-03 DIAGNOSIS — N2889 Other specified disorders of kidney and ureter: Secondary | ICD-10-CM

## 2016-03-13 ENCOUNTER — Ambulatory Visit
Admission: RE | Admit: 2016-03-13 | Discharge: 2016-03-13 | Disposition: A | Discharge status: Home / Self Care | Payer: BLUE CROSS/BLUE SHIELD | Source: Ambulatory Visit | Attending: Internal Medicine | Admitting: Internal Medicine

## 2016-03-13 DIAGNOSIS — N2889 Other specified disorders of kidney and ureter: Secondary | ICD-10-CM

## 2016-03-13 MED ORDER — GADOBENATE DIMEGLUMINE 529 MG/ML IV SOLN
20.0000 mL | Freq: Once | INTRAVENOUS | Status: AC | PRN
  Administered 2016-03-13: 20 mL via INTRAVENOUS

## 2016-07-19 ENCOUNTER — Encounter: Payer: Self-pay | Admitting: Nephrology

## 2016-07-19 DIAGNOSIS — N200 Calculus of kidney: Secondary | ICD-10-CM | POA: Insufficient documentation

## 2016-11-04 ENCOUNTER — Other Ambulatory Visit: Payer: Self-pay | Admitting: Orthopaedic Surgery

## 2016-11-04 DIAGNOSIS — M4726 Other spondylosis with radiculopathy, lumbar region: Secondary | ICD-10-CM

## 2016-11-13 ENCOUNTER — Ambulatory Visit
Admission: RE | Admit: 2016-11-13 | Discharge: 2016-11-13 | Disposition: A | Discharge status: Home / Self Care | Payer: BLUE CROSS/BLUE SHIELD | Source: Ambulatory Visit | Attending: Orthopaedic Surgery | Admitting: Orthopaedic Surgery

## 2016-11-13 DIAGNOSIS — M4726 Other spondylosis with radiculopathy, lumbar region: Secondary | ICD-10-CM

## 2017-01-04 ENCOUNTER — Other Ambulatory Visit: Payer: Self-pay | Admitting: Family Medicine

## 2017-01-05 NOTE — Telephone Encounter (Signed)
Needs office visit for any refills.

## 2017-01-07 ENCOUNTER — Telehealth: Payer: Self-pay | Admitting: Family Medicine

## 2017-01-07 NOTE — Telephone Encounter (Signed)
SPOKE WITH PATIENT HE STATES THAT HE DIDN'T ASK FOR A REFILL ON ALBUTEROL AND THAT THE PHARMACY JUST SENT YOU A REQUEST BECAUSE THEY ASSUME HE NEEDED ONE

## 2017-02-08 ENCOUNTER — Encounter: Payer: Self-pay | Admitting: Radiology

## 2017-02-08 ENCOUNTER — Other Ambulatory Visit: Payer: Self-pay | Admitting: Internal Medicine

## 2017-02-08 DIAGNOSIS — R222 Localized swelling, mass and lump, trunk: Secondary | ICD-10-CM

## 2017-02-11 ENCOUNTER — Other Ambulatory Visit: Payer: BLUE CROSS/BLUE SHIELD

## 2017-03-07 ENCOUNTER — Ambulatory Visit (INDEPENDENT_AMBULATORY_CARE_PROVIDER_SITE_OTHER): Payer: BLUE CROSS/BLUE SHIELD | Admitting: Emergency Medicine

## 2017-03-07 ENCOUNTER — Encounter: Payer: Self-pay | Admitting: Emergency Medicine

## 2017-03-07 VITALS — BP 128/80 | HR 77 | Temp 98.6°F | Resp 18 | Ht 72.0 in | Wt 253.6 lb

## 2017-03-07 DIAGNOSIS — M109 Gout, unspecified: Secondary | ICD-10-CM

## 2017-03-07 DIAGNOSIS — M25572 Pain in left ankle and joints of left foot: Secondary | ICD-10-CM

## 2017-03-07 HISTORY — DX: Gout, unspecified: M10.9

## 2017-03-07 MED ORDER — INDOMETHACIN 50 MG PO CAPS: 50.0000 mg | ORAL_CAPSULE | Freq: Three times a day (TID) | ORAL | 1 refills | Status: AC

## 2017-03-07 NOTE — Progress Notes (Signed)
[name] 61 y.o.   Chief Complaint  Patient presents with  . Gout    per pt his feet is really bothering him.    HISTORY OF PRESENT ILLNESS: This is a 61 y.o. male complaining of pain to left big toe x 2 days. Denies trauma.  HPI   Prior to Admission medications   Medication Sig Start Date End Date Taking? Authorizing Provider  albuterol (PROVENTIL HFA) 108 (90 Base) MCG/ACT inhaler Inhale 2 puffs into the lungs every 4 (four) hours as needed for wheezing or shortness of breath. Needs office visit for any refills. 01/05/17  Yes Shawnee Knapp, MD  aspirin 81 MG tablet Take 1 tablet (81 mg total) by mouth daily. 02/01/13  Yes [name], MD  atorvastatin (LIPITOR) 10 MG tablet Take 1 tablet (10 mg total) by mouth daily. 11/17/12  Yes [name], MD  enalapril (VASOTEC) 10 MG tablet Take 10 mg by mouth daily. Reported on 11/02/2015 01/22/15  Yes [provider]  gabapentin (NEURONTIN) 300 MG capsule Reported on 11/02/2015 01/09/15  Yes [provider]  hydrALAZINE (APRESOLINE) 25 MG tablet Take 25 mg by mouth 2 (two) times daily.   Yes [provider]  hydrochlorothiazide (HYDRODIURIL) 12.5 MG tablet Take 12.5 mg by mouth daily.   Yes [provider]  labetalol (NORMODYNE) 300 MG tablet Take 300 mg by mouth 3 (three) times daily.   Yes [provider]  levofloxacin (LEVAQUIN) 500 MG tablet Take 1 tablet (500 mg total) by mouth daily. 11/02/15  Yes Shawnee Knapp, MD  olmesartan (BENICAR) 40 MG tablet Take 40 mg by mouth daily.   Yes [provider]  predniSONE (DELTASONE) 20 MG tablet Take 2 tablets (40 mg total) by mouth daily with breakfast. 11/02/15  Yes Shawnee Knapp, MD  predniSONE (DELTASONE) 5 MG tablet Take 5 mg by mouth daily. Reported on 11/02/2015   Yes [provider]  zolpidem (AMBIEN) 10 MG tablet Take 5 mg by mouth at bedtime as needed for sleep.   Yes [provider]  busPIRone (BUSPAR) 7.5 MG tablet Take 7.5 mg  by mouth every morning. Reported on 11/02/2015 01/18/15   [provider]  diazepam (VALIUM) 5 MG tablet Reported on 11/02/2015 12/25/14   [provider]  diltiazem (CARDIZEM CD) 360 MG 24 hr capsule Take 360 mg by mouth daily.    [provider]  furosemide (LASIX) 20 MG tablet Reported on 11/02/2015 01/22/15   [provider]  Guaifenesin ([place]) 1200 MG TB12 Take 1 tablet (1,200 mg total) by mouth every 12 (twelve) hours as needed. Patient not taking: Reported on 03/07/2017 11/02/15   Shawnee Knapp, MD  Rivaroxaban (XARELTO) 20 MG TABS Take 20 mg by mouth daily. Reported on 11/02/2015    [provider]  Spacer/Aero-Holding Chambers ([place]) [place] 1 each by Other route once. Patient not taking: Reported on 03/07/2017 11/02/15   Shawnee Knapp, MD  traMADol Veatrice [place]) 50 MG tablet Reported on 11/02/2015 01/09/15   [provider]  vardenafil (LEVITRA) 10 MG tablet Take 10 mg by mouth as needed for erectile dysfunction. Reported on 11/02/2015    [provider]    Allergies  Allergen Reactions  . Codeine     REACTION: Reaction not known  . Hydromorphone Other (See Comments)    hallucinations     Patient Active Problem List   Diagnosis Date Noted  . Nephrolithiasis   .  Fever, unspecified 11/02/2015  . Essential hypertension 02/07/2015  . Pulmonary embolism ([place]) 01/30/2013  . Sarcoidosis 01/30/2013  . Hemoptysis 01/30/2013  . SHOULDER PAIN, LEFT 11/28/2009    Past Medical History:  Diagnosis Date  . Back pain   . CKD (chronic kidney disease), stage III   . CVA (cerebral infarction)   . Diastolic dysfunction   . ED (erectile dysfunction)   . GERD (gastroesophageal reflux disease)   . Hypercalcemia   . Hyperlipidemia   . Hypertension   . Insomnia   . Long-term use of high-risk medication   . Nephrocalcinosis   . Nephrolithiasis   . Obesity   . Osteopenia   . Pancreatitis   . PE  (pulmonary embolism)   . Proteinuria   . Sarcoidosis   . Smoker     Past Surgical History:  Procedure Laterality Date  . ABDOMINAL EXPLORATION SURGERY    . BACK SURGERY    . CHOLECYSTECTOMY    . SHOULDER SURGERY      Social History   Social History  . Marital status: Married    Spouse name: N/A  . Number of children: N/A  . Years of education: N/A   Occupational History  . Not on file.   Social History Main Topics  . Smoking status: Former Smoker    Types: Cigarettes  . Smokeless tobacco: Never Used  . Alcohol use No  . Drug use: No  . Sexual activity: Yes   Other Topics Concern  . Not on file   Social History Narrative  . No narrative on file    Family History  Problem Relation Age of Onset  . Hypertension Father      Review of Systems  Constitutional: Negative for chills and fever.  HENT: Negative.   Eyes: Negative.   Respiratory: Negative for cough and shortness of breath.   Cardiovascular: Negative for chest pain and palpitations.  Gastrointestinal: Negative for abdominal pain, diarrhea, nausea and vomiting.  Genitourinary: Negative.   Musculoskeletal: Positive for joint pain.  Skin: Negative for rash.  Neurological: Negative.  Negative for dizziness and headaches.  Endo/Heme/Allergies: Negative.   All other systems reviewed and are negative.  Vitals:   03/07/17 0818  BP: (!) 156/92  Pulse: 77  Resp: 18  Temp: 98.6 F (37 C)     Physical Exam  Constitutional: He is oriented to person, place, and time. He appears well-developed and well-nourished.  HENT:  Head: Normocephalic and atraumatic.  Eyes: EOM are normal. Pupils are equal, round, and reactive to light.  Neck: Normal range of motion. Neck supple.  Cardiovascular: Normal rate and regular rhythm.   Pulmonary/Chest: Effort normal and breath sounds normal.  Musculoskeletal:  Left foot: +erythema with swelling and tenderness to 1st MTP joint.  Neurological: He is alert and oriented  to person, place, and time.  Skin: Skin is warm and dry. Capillary refill takes less than 2 seconds.  Psychiatric: He has a normal mood and affect.  Vitals reviewed.    ASSESSMENT & PLAN: Craige was seen today for gout.  Diagnoses and all orders for this visit:  Acute gouty arthritis  Arthralgia of left foot  Other orders -     indomethacin (INDOCIN) 50 MG capsule; Take 1 capsule (50 mg total) by mouth 3 (three) times daily with meals.    Patient Instructions    Increase Prednisone to 40 mg daily x 3 days and start Indocin as prescribed.   IF you received an x-ray today,  you will receive an invoice from [facility]. Please contact [facility] at [phone] with questions or concerns regarding your invoice.   IF you received labwork today, you will receive an invoice from [facility]. Please contact [facility] at [phone] with questions or concerns regarding your invoice.   Our billing staff will not be able to assist you with questions regarding bills from these companies.  You will be contacted with the lab results as soon as they are available. The fastest way to get your results is to activate your My Chart account. Instructions are located on the last page of this paperwork. If you have not heard from [place] regarding the results in 2 weeks, please contact this office.     Gout Gout is painful swelling that can happen in some of your joints. Gout is a type of arthritis. This condition is caused by having too much uric acid in your body. Uric acid is a chemical that is made when your body breaks down substances called purines. If your body has too much uric acid, sharp crystals can form and build up in your joints. This causes pain and swelling. Gout attacks can happen quickly and be very painful (acute gout). Over time, the attacks can affect more joints and happen more often (chronic gout). Follow these instructions at home: During a Gout Attack  If  directed, put ice on the painful area: ? Put ice in a plastic bag. ? Place a towel between your skin and the bag. ? Leave the ice on for 20 minutes, 2-3 times a day.  Rest the joint as much as possible. If the joint is in your leg, you may be given crutches to use.  Raise (elevate) the painful joint above the level of your heart as often as you can.  Drink enough fluids to keep your pee (urine) clear or pale yellow.  Take over-the-counter and prescription medicines only as told by your doctor.  Do not drive or use heavy machinery while taking prescription pain medicine.  Follow instructions from your doctor about what you can or cannot eat and drink.  Return to your normal activities as told by your doctor. Ask your doctor what activities are safe for you. Avoiding Future Gout Attacks  Follow a low-purine diet as told by a specialist (dietitian) or your doctor. Avoid foods and drinks that have a lot of purines, such as: ? Liver. ? Kidney. ? Anchovies. ? Asparagus. ? Herring. ? Mushrooms ? Mussels. ? Beer.  Limit alcohol intake to no more than 1 drink a day for nonpregnant women and 2 drinks a day for men. One drink equals 12 oz of beer, 5 oz of wine, or 1 oz of hard liquor.  Stay at a healthy weight or lose weight if you are overweight. If you want to lose weight, talk with your doctor. It is important that you do not lose weight too fast.  Start or continue an exercise plan as told by your doctor.  Drink enough fluids to keep your pee clear or pale yellow.  Take over-the-counter and prescription medicines only as told by your doctor.  Keep all follow-up visits as told by your doctor. This is important. Contact a doctor if:  You have another gout attack.  You still have symptoms of a gout attack after10 days of treatment.  You have problems (side effects) because of your medicines.  You have chills or a fever.  You have burning pain when you pee (urinate).  You  have pain in your lower back or belly. Get help right away if:  You have very bad pain.  Your pain cannot be controlled.  You cannot pee. This information is not intended to replace advice given to you by your health care provider. Make sure you discuss any questions you have with your health care provider. Document Released: 06/01/2008 Document Revised: 01/29/2016 Document Reviewed: 06/05/2015 Elsevier Interactive Patient Education  2018 [employer].      Agustina Caroli, MD [facility]

## 2017-03-07 NOTE — Patient Instructions (Addendum)
Increase Prednisone to 40 mg daily x 3 days and start Indocin as prescribed.   IF you received an x-ray today, you will receive an invoice from [facility]. Please contact [facility] at [phone] with questions or concerns regarding your invoice.   IF you received labwork today, you will receive an invoice from [place]. Please contact [facility] at [phone] with questions or concerns regarding your invoice.   Our billing staff will not be able to assist you with questions regarding bills from these companies.  You will be contacted with the lab results as soon as they are available. The fastest way to get your results is to activate your My Chart account. Instructions are located on the last page of this paperwork. If you have not heard from [place] regarding the results in 2 weeks, please contact this office.     Gout Gout is painful swelling that can happen in some of your joints. Gout is a type of arthritis. This condition is caused by having too much uric acid in your body. Uric acid is a chemical that is made when your body breaks down substances called purines. If your body has too much uric acid, sharp crystals can form and build up in your joints. This causes pain and swelling. Gout attacks can happen quickly and be very painful (acute gout). Over time, the attacks can affect more joints and happen more often (chronic gout). Follow these instructions at home: During a Gout Attack  If directed, put ice on the painful area: ? Put ice in a plastic bag. ? Place a towel between your skin and the bag. ? Leave the ice on for 20 minutes, 2-3 times a day.  Rest the joint as much as possible. If the joint is in your leg, you may be given crutches to use.  Raise (elevate) the painful joint above the level of your heart as often as you can.  Drink enough fluids to keep your pee (urine) clear or pale yellow.  Take over-the-counter and prescription medicines only as told  by your doctor.  Do not drive or use heavy machinery while taking prescription pain medicine.  Follow instructions from your doctor about what you can or cannot eat and drink.  Return to your normal activities as told by your doctor. Ask your doctor what activities are safe for you. Avoiding Future Gout Attacks  Follow a low-purine diet as told by a specialist (dietitian) or your doctor. Avoid foods and drinks that have a lot of purines, such as: ? Liver. ? Kidney. ? Anchovies. ? Asparagus. ? Herring. ? Mushrooms ? Mussels. ? Beer.  Limit alcohol intake to no more than 1 drink a day for nonpregnant women and 2 drinks a day for men. One drink equals 12 oz of beer, 5 oz of wine, or 1 oz of hard liquor.  Stay at a healthy weight or lose weight if you are overweight. If you want to lose weight, talk with your doctor. It is important that you do not lose weight too fast.  Start or continue an exercise plan as told by your doctor.  Drink enough fluids to keep your pee clear or pale yellow.  Take over-the-counter and prescription medicines only as told by your doctor.  Keep all follow-up visits as told by your doctor. This is important. Contact a doctor if:  You have another gout attack.  You still have symptoms of a gout attack after10 days of treatment.  You have problems (side  effects) because of your medicines.  You have chills or a fever.  You have burning pain when you pee (urinate).  You have pain in your lower back or belly. Get help right away if:  You have very bad pain.  Your pain cannot be controlled.  You cannot pee. This information is not intended to replace advice given to you by your health care provider. Make sure you discuss any questions you have with your health care provider. Document Released: 06/01/2008 Document Revised: 01/29/2016 Document Reviewed: 06/05/2015 Elsevier Interactive Patient Education  [employer].

## 2017-07-29 ENCOUNTER — Ambulatory Visit: Payer: BLUE CROSS/BLUE SHIELD | Admitting: Physician Assistant

## 2017-07-30 ENCOUNTER — Emergency Department (HOSPITAL_COMMUNITY): Payer: BLUE CROSS/BLUE SHIELD

## 2017-07-30 ENCOUNTER — Other Ambulatory Visit: Payer: Self-pay

## 2017-07-30 ENCOUNTER — Emergency Department (HOSPITAL_COMMUNITY)
Admission: EM | Admit: 2017-07-30 | Discharge: 2017-07-30 | Disposition: A | Discharge status: Home / Self Care | Payer: BLUE CROSS/BLUE SHIELD | Attending: Emergency Medicine | Admitting: Emergency Medicine

## 2017-07-30 ENCOUNTER — Encounter (HOSPITAL_COMMUNITY): Payer: Self-pay | Admitting: Emergency Medicine

## 2017-07-30 DIAGNOSIS — N183 Chronic kidney disease, stage 3 (moderate): Secondary | ICD-10-CM | POA: Diagnosis not present

## 2017-07-30 DIAGNOSIS — M79601 Pain in right arm: Secondary | ICD-10-CM | POA: Insufficient documentation

## 2017-07-30 DIAGNOSIS — Z7982 Long term (current) use of aspirin: Secondary | ICD-10-CM | POA: Insufficient documentation

## 2017-07-30 DIAGNOSIS — R52 Pain, unspecified: Secondary | ICD-10-CM

## 2017-07-30 DIAGNOSIS — R2231 Localized swelling, mass and lump, right upper limb: Secondary | ICD-10-CM | POA: Diagnosis not present

## 2017-07-30 DIAGNOSIS — Z79899 Other long term (current) drug therapy: Secondary | ICD-10-CM | POA: Diagnosis not present

## 2017-07-30 DIAGNOSIS — I129 Hypertensive chronic kidney disease with stage 1 through stage 4 chronic kidney disease, or unspecified chronic kidney disease: Secondary | ICD-10-CM | POA: Insufficient documentation

## 2017-07-30 DIAGNOSIS — Z87891 Personal history of nicotine dependence: Secondary | ICD-10-CM | POA: Insufficient documentation

## 2017-07-30 DIAGNOSIS — M7989 Other specified soft tissue disorders: Secondary | ICD-10-CM | POA: Insufficient documentation

## 2017-07-30 LAB — CBC
HCT: 42.5 % (ref 39.0–52.0)
Hemoglobin: 14.6 g/dL (ref 13.0–17.0)
MCH: 31.5 pg (ref 26.0–34.0)
MCHC: 34.4 g/dL (ref 30.0–36.0)
MCV: 91.6 fL (ref 78.0–100.0)
Platelets: 233 10*3/uL (ref 150–400)
RBC: 4.64 MIL/uL (ref 4.22–5.81)
RDW: 14.2 % (ref 11.5–15.5)
WBC: 8.8 10*3/uL (ref 4.0–10.5)

## 2017-07-30 LAB — BASIC METABOLIC PANEL
Anion gap: 11 (ref 5–15)
BUN: 14 mg/dL (ref 6–20)
CO2: 27 mmol/L (ref 22–32)
Calcium: 9.1 mg/dL (ref 8.9–10.3)
Chloride: 100 mmol/L — ABNORMAL LOW (ref 101–111)
Creatinine, Ser: 1.54 mg/dL — ABNORMAL HIGH (ref 0.61–1.24)
GFR calc Af Amer: 55 mL/min — ABNORMAL LOW (ref >60)
GFR calc non Af Amer: 47 mL/min — ABNORMAL LOW (ref >60)
Glucose, Bld: 139 mg/dL — ABNORMAL HIGH (ref 65–99)
Potassium: 3.4 mmol/L — ABNORMAL LOW (ref 3.5–5.1)
Sodium: 138 mmol/L (ref 135–145)

## 2017-07-30 LAB — I-STAT TROPONIN, ED: Troponin i, poc: 0 ng/mL (ref 0.00–0.08)

## 2017-07-30 MED ORDER — ENOXAPARIN SODIUM 120 MG/0.8ML ~~LOC~~ SOLN
120.0000 mg | Freq: Once | SUBCUTANEOUS | Status: AC
  Administered 2017-07-30: 120 mg via SUBCUTANEOUS
  Filled 2017-07-30: qty 0.8

## 2017-07-30 NOTE — ED Notes (Addendum)
Waiting on pharmacy to dose lovenox and sent it

## 2017-07-30 NOTE — ED Triage Notes (Signed)
Reports having pain in right arm from shoulder down.  Unsure of any injury.  Reports having the same happen before but didn't get it checked out.  Also reports that when it happens that arm will swell.

## 2017-07-30 NOTE — ED Provider Notes (Signed)
[facility] Provider Note   CSN: [id number] Arrival date & time: 07/30/17  1941     History   Chief Complaint Chief Complaint  Patient presents with  . Arm Pain    HPI [name] is a 61 y.o. male.  [name] is a 61 y.o. Male who presents to the ED complaining of right arm pain and swelling starting today.  She reports today he noticed that his right arm was slightly swollen and painful.  He reports it is diffusely painful and not in a specific area.  He denies any injury or trauma to his arm.  No numbness, tingling or weakness.  No neck pain.  He does report a history of a PE many years ago.  He is not on a he denies any recent long travel or recent surgery.  He denies history of DVT.  He denies fevers, coughing, chest pain, shortness of breath, palpitations, leg pain, leg swelling, numbness, tingling, weakness, neck pain, back pain, fall or injury.   The history is provided by the patient, medical records and the spouse. No language interpreter was used.  Arm Pain  Pertinent negatives include no chest pain, no abdominal pain, no headaches and no shortness of breath.    Past Medical History:  Diagnosis Date  . Back pain   . CKD (chronic kidney disease), stage III ([place])   . CVA (cerebral infarction)   . Diastolic dysfunction   . ED (erectile dysfunction)   . GERD (gastroesophageal reflux disease)   . Hypercalcemia   . Hyperlipidemia   . Hypertension   . Insomnia   . Long-term use of high-risk medication   . Nephrocalcinosis   . Nephrolithiasis   . Obesity   . Osteopenia   . Pancreatitis   . PE (pulmonary embolism)   . Proteinuria   . Sarcoidosis   . Smoker     Patient Active Problem List   Diagnosis Date Noted  . Acute gouty arthritis 03/07/2017  . Arthralgia of left foot 03/07/2017  . Nephrolithiasis   . Fever, unspecified 11/02/2015  . Essential hypertension 02/07/2015  . Pulmonary embolism ([place])  01/30/2013  . Sarcoidosis 01/30/2013  . Hemoptysis 01/30/2013  . SHOULDER PAIN, LEFT 11/28/2009    Past Surgical History:  Procedure Laterality Date  . ABDOMINAL EXPLORATION SURGERY    . BACK SURGERY    . CHOLECYSTECTOMY    . SHOULDER SURGERY         Home Medications    Prior to Admission medications   Medication Sig Start Date End Date Taking? Authorizing Provider  albuterol (PROVENTIL HFA) 108 (90 Base) MCG/ACT inhaler Inhale 2 puffs into the lungs every 4 (four) hours as needed for wheezing or shortness of breath. Needs office visit for any refills. 01/05/17   Shawnee Knapp, MD  aspirin 81 MG tablet Take 1 tablet (81 mg total) by mouth daily. 02/01/13   [name], MD  atorvastatin (LIPITOR) 10 MG tablet Take 1 tablet (10 mg total) by mouth daily. 11/17/12   [name], MD  busPIRone (BUSPAR) 7.5 MG tablet Take 7.5 mg by mouth every morning. Reported on 11/02/2015 01/18/15   [provider]  diazepam (VALIUM) 5 MG tablet Reported on 11/02/2015 12/25/14   [provider]  diltiazem (CARDIZEM CD) 360 MG 24 hr capsule Take 360 mg by mouth daily.    [provider]  enalapril (VASOTEC) 10 MG tablet Take 10 mg by mouth daily. Reported  on 11/02/2015 01/22/15   [provider]  furosemide (LASIX) 20 MG tablet Reported on 11/02/2015 01/22/15   [provider]  gabapentin (NEURONTIN) 300 MG capsule Reported on 11/02/2015 01/09/15   [provider]  Guaifenesin (MUCINEX MAXIMUM STRENGTH) 1200 MG TB12 Take 1 tablet (1,200 mg total) by mouth every 12 (twelve) hours as needed. Patient not taking: Reported on 03/07/2017 11/02/15   Shawnee Knapp, MD  hydrALAZINE (APRESOLINE) 25 MG tablet Take 25 mg by mouth 2 (two) times daily.    [provider]  hydrochlorothiazide (HYDRODIURIL) 12.5 MG tablet Take 12.5 mg by mouth daily.    [provider]  labetalol (NORMODYNE) 300 MG tablet Take 300 mg by mouth 3 (three) times daily.    [provider]  levofloxacin (LEVAQUIN) 500 MG tablet Take 1 tablet (500 mg total) by mouth daily. 11/02/15   Shawnee Knapp, MD  olmesartan (BENICAR) 40 MG tablet Take 40 mg by mouth daily.    [provider]  predniSONE (DELTASONE) 20 MG tablet Take 2 tablets (40 mg total) by mouth daily with breakfast. 11/02/15   Shawnee Knapp, MD  predniSONE (DELTASONE) 5 MG tablet Take 5 mg by mouth daily. Reported on 11/02/2015    [provider]  Spacer/Aero-Holding Chambers ([place]) [place] 1 each by Other route once. Patient not taking: Reported on 03/07/2017 11/02/15   Shawnee Knapp, MD  traMADol Veatrice [place]) 50 MG tablet Reported on 11/02/2015 01/09/15   [provider]  vardenafil (LEVITRA) 10 MG tablet Take 10 mg by mouth as needed for erectile dysfunction. Reported on 11/02/2015    [provider]  zolpidem (AMBIEN) 10 MG tablet Take 5 mg by mouth at bedtime as needed for sleep.    [provider]    Family History Family History  Problem Relation Age of Onset  . Hypertension Father     Social History Social History   Tobacco Use  . Smoking status: Former Smoker    Types: Cigarettes  . Smokeless tobacco: Never Used  Substance Use Topics  . Alcohol use: No    Alcohol/week: 0.0 oz  . Drug use: No     Allergies   Codeine and Hydromorphone   Review of Systems Review of Systems  Constitutional: Negative for chills and fever.  HENT: Negative for congestion and sore throat.   Eyes: Negative for visual disturbance.  Respiratory: Negative for cough, shortness of breath and wheezing.   Cardiovascular: Negative for chest pain and palpitations.  Gastrointestinal: Negative for abdominal pain, nausea and vomiting.  Genitourinary: Negative for dysuria.  Musculoskeletal: Positive for arthralgias. Negative for back pain and neck pain.  Skin: Negative for rash.  Neurological: Negative for syncope, weakness, light-headedness, numbness and  headaches.     Physical Exam Updated Vital Signs BP (!) 163/99   Pulse 71   Temp 99.1 F (37.3 C) (Oral)   Resp 18   Ht 6' (1.829 m)   Wt 117.9 kg (260 lb)   SpO2 94%   BMI 35.26 kg/m   Physical Exam  Constitutional: He appears well-developed and well-nourished. No distress.  Nontoxic appearing.  HENT:  Head: Normocephalic and atraumatic.  Mouth/Throat: Oropharynx is clear and moist.  Eyes: Conjunctivae are normal. Pupils are equal, round, and reactive to light. Right eye exhibits no discharge. Left eye exhibits no discharge.  Neck: Normal range of motion. Neck supple. No JVD present. No tracheal deviation present.  Cardiovascular: Normal rate, regular  rhythm, normal heart sounds and intact distal pulses. Exam reveals no gallop and no friction rub.  No murmur heard. Bilateral radial pulses are intact.  Pulmonary/Chest: Effort normal and breath sounds normal. No respiratory distress. He has no wheezes. He has no rales.  Lungs are clear to ascultation bilaterally. Symmetric chest expansion bilaterally. No increased work of breathing. No rales or rhonchi.    Abdominal: Soft. There is no tenderness.  Musculoskeletal: Normal range of motion. He exhibits edema.  Slight edema noted around the patient's right fingers and wrists.  No pain with range of motion at his wrist.  He has good range of motion at his right elbow and shoulder joints.  No evidence of septic joint.  No overlying skin changes to his arm.  No palpable cords.  No midline neck tenderness to palpation.  Lymphadenopathy:    He has no cervical adenopathy.  Neurological: He is alert. No sensory deficit. He exhibits normal muscle tone. Coordination normal.  Sensation and strength is intact his bilateral upper extremities.  Skin: Skin is warm and dry. Capillary refill takes less than 2 seconds. No rash noted. He is not diaphoretic. No erythema. No pallor.  Psychiatric: He has a normal mood and affect. His behavior is normal.    Nursing note and vitals reviewed.    ED Treatments / Results  Labs (all labs ordered are listed, but only abnormal results are displayed) Labs Reviewed  BASIC METABOLIC PANEL - Abnormal; Notable for the following components:      Result Value   Potassium 3.4 (*)    Chloride 100 (*)    Glucose, Bld 139 (*)    Creatinine, Ser 1.54 (*)    GFR calc non Af Amer 47 (*)    GFR calc Af Amer 55 (*)    All other components within normal limits  CBC  I-STAT TROPONIN, ED    EKG  EKG Interpretation  Date/Time:  Saturday July 30 2017 20:04:51 EST Ventricular Rate:  86 PR Interval:  172 QRS Duration: 100 QT Interval:  412 QTC Calculation: 493 R Axis:   -78 Text Interpretation:  Normal sinus rhythm Left anterior fascicular block Nonspecific ST and T wave abnormality Abnormal ECG No significant change since last tracing Confirmed by Lacretia Leigh ([id number]) on 07/30/2017 10:28:28 PM       Radiology Dg Chest 2 View  Result Date: 07/30/2017 CLINICAL DATA:  61 year old male with pain in right arm radiating from shoulder. EXAM: CHEST  2 VIEW COMPARISON:  11/02/2015 FINDINGS: The heart size and mediastinal contours are within normal limits. Stable, mild prominence of the interstitium. Note is made of bibasilar atelectasis. No focal parenchymal opacity, pleural effusion or pneumothorax. The visualized skeletal structures are unremarkable. IMPRESSION: Stable radiographic appearance of the chest without acute intrathoracic pathology. Stable prominence of the interstitium and bibasilar atelectasis. Electronically Signed   By: Kristopher Oppenheim M.D.   On: 07/30/2017 20:20    Procedures Procedures (including critical care time)  Medications Ordered in ED Medications - No data to display   Initial Impression / Assessment and Plan / ED Course  I have reviewed the triage vital signs and the nursing notes.  Pertinent labs & imaging results that were available during my care of the patient were  reviewed by me and considered in my medical decision making (see chart for details).    This is a 61 y.o. Male who presents to the ED complaining of right arm pain and swelling starting today.  She reports  today he noticed that his right arm was slightly swollen and painful.  He reports it is diffusely painful and not in a specific area.  He denies any injury or trauma to his arm.  No numbness, tingling or weakness.  No neck pain.  He does report a history of a PE many years ago.  He is not on a he denies any recent long travel or recent surgery.  He denies history of DVT.  He denies fevers, coughing, chest pain, shortness of breath. On exam the patient is afebrile nontoxic-appearing.  He does have mild edema noted to his right upper extremity.  No overlying skin changes.  No rashes noted.  He has good pulses and sensation distally.  No evidence of any septic joint.  No evidence of any MI or PE at this time.  BMP shows a creatinine around his baseline.  Chest x-ray is unremarkable. Concern is for possibly a DVT in this patient with a history of a PE.  We are unable to obtain a Doppler ultrasound at this time of night.  We will have him return in the morning for ultrasound.  In the meantime we will anticoagulate him with Lovenox due to his renal function.  Patient and family is in agreement with plan.  Ultrasound orders placed.  I discussed return precautions. I advised the patient to follow-up with their primary care provider this week. I advised the patient to return to the emergency department with new or worsening symptoms or new concerns. The patient verbalized understanding and agreement with plan.   This patient was discussed with Dr. Zenia Resides who agrees with assessment and plan.  Final Clinical Impressions(s) / ED Diagnoses   Final diagnoses:  Right arm pain  Swelling of right upper extremity    ED Discharge Orders        Ordered    UE VENOUS DUPLEX     07/30/17 2307       Waynetta Pean,  PA-C 07/30/17 2311    Lacretia Leigh, MD 07/31/17 [phone]

## 2017-07-30 NOTE — ED Notes (Signed)
The pt is c/o rt arm pain with swelling all day no known injury  Good radial pulse  Hx of the same

## 2017-07-30 NOTE — Discharge Instructions (Signed)
Please return tomorrow at specified time for ultrasound.

## 2017-07-31 ENCOUNTER — Other Ambulatory Visit: Payer: Self-pay

## 2017-07-31 ENCOUNTER — Emergency Department (HOSPITAL_COMMUNITY): Payer: BLUE CROSS/BLUE SHIELD

## 2017-07-31 ENCOUNTER — Ambulatory Visit (HOSPITAL_BASED_OUTPATIENT_CLINIC_OR_DEPARTMENT_OTHER)
Admission: RE | Admit: 2017-07-31 | Discharge: 2017-07-31 | Disposition: A | Discharge status: Home / Self Care | Payer: BLUE CROSS/BLUE SHIELD | Source: Ambulatory Visit | Attending: Emergency Medicine | Admitting: Emergency Medicine

## 2017-07-31 ENCOUNTER — Emergency Department (HOSPITAL_COMMUNITY)
Admission: EM | Admit: 2017-07-31 | Discharge: 2017-07-31 | Disposition: A | Discharge status: Home / Self Care | Payer: BLUE CROSS/BLUE SHIELD | Attending: Emergency Medicine | Admitting: Emergency Medicine

## 2017-07-31 ENCOUNTER — Encounter (HOSPITAL_COMMUNITY): Payer: Self-pay

## 2017-07-31 DIAGNOSIS — I129 Hypertensive chronic kidney disease with stage 1 through stage 4 chronic kidney disease, or unspecified chronic kidney disease: Secondary | ICD-10-CM | POA: Diagnosis not present

## 2017-07-31 DIAGNOSIS — M79621 Pain in right upper arm: Secondary | ICD-10-CM | POA: Insufficient documentation

## 2017-07-31 DIAGNOSIS — M7989 Other specified soft tissue disorders: Secondary | ICD-10-CM

## 2017-07-31 DIAGNOSIS — M79601 Pain in right arm: Secondary | ICD-10-CM | POA: Diagnosis present

## 2017-07-31 DIAGNOSIS — Z79899 Other long term (current) drug therapy: Secondary | ICD-10-CM | POA: Insufficient documentation

## 2017-07-31 DIAGNOSIS — Z86711 Personal history of pulmonary embolism: Secondary | ICD-10-CM | POA: Diagnosis not present

## 2017-07-31 DIAGNOSIS — Z7982 Long term (current) use of aspirin: Secondary | ICD-10-CM | POA: Insufficient documentation

## 2017-07-31 DIAGNOSIS — R52 Pain, unspecified: Secondary | ICD-10-CM

## 2017-07-31 DIAGNOSIS — N183 Chronic kidney disease, stage 3 (moderate): Secondary | ICD-10-CM | POA: Insufficient documentation

## 2017-07-31 MED ORDER — DEXAMETHASONE 4 MG PO TABS: 4.0000 mg | ORAL_TABLET | Freq: Two times a day (BID) | ORAL | 0 refills | Status: DC

## 2017-07-31 MED ORDER — TRAMADOL HCL 50 MG PO TABS: 50.0000 mg | ORAL_TABLET | Freq: Four times a day (QID) | ORAL | 0 refills | Status: DC | PRN

## 2017-07-31 MED ORDER — KETOROLAC TROMETHAMINE 15 MG/ML IJ SOLN
15.0000 mg | Freq: Once | INTRAMUSCULAR | Status: AC
  Administered 2017-07-31: 15 mg via INTRAVENOUS
  Filled 2017-07-31: qty 1

## 2017-07-31 MED ORDER — SODIUM CHLORIDE 0.9 % IV BOLUS (SEPSIS)
1000.0000 mL | Freq: Once | INTRAVENOUS | Status: AC
  Administered 2017-07-31: 1000 mL via INTRAVENOUS

## 2017-07-31 MED ORDER — FENTANYL CITRATE (PF) 100 MCG/2ML IJ SOLN
100.0000 ug | Freq: Once | INTRAMUSCULAR | Status: AC
  Administered 2017-07-31: 100 ug via INTRAVENOUS
  Filled 2017-07-31: qty 2

## 2017-07-31 MED ORDER — LORAZEPAM 2 MG/ML IJ SOLN
1.0000 mg | Freq: Once | INTRAMUSCULAR | Status: AC
  Administered 2017-07-31: 1 mg via INTRAVENOUS
  Filled 2017-07-31: qty 1

## 2017-07-31 MED ORDER — DEXAMETHASONE SODIUM PHOSPHATE 10 MG/ML IJ SOLN
10.0000 mg | Freq: Once | INTRAMUSCULAR | Status: AC
  Administered 2017-07-31: 10 mg via INTRAVENOUS
  Filled 2017-07-31: qty 1

## 2017-07-31 NOTE — ED Provider Notes (Signed)
[facility] EMERGENCY DEPARTMENT Provider Note   CSN: [id number] Arrival date & time: 07/31/17  5643     History   Chief Complaint No chief complaint on file.   HPI [name] is a 61 y.o. male.  HPI   61 year old male with right upper extremity pain and swelling.  Gradual onset about 3 days ago.  Pain is from the right shoulder down into the hand.  It aches at rest.  Worse with movement.  No rash.  No numbness or tingling.  He had an ultrasound earlier today which was negative for DVT. Has only tried tylenol for pain.   Past Medical History:  Diagnosis Date  . Back pain   . CKD (chronic kidney disease), stage III ([place])   . CVA (cerebral infarction)   . Diastolic dysfunction   . ED (erectile dysfunction)   . GERD (gastroesophageal reflux disease)   . Hypercalcemia   . Hyperlipidemia   . Hypertension   . Insomnia   . Long-term use of high-risk medication   . Nephrocalcinosis   . Nephrolithiasis   . Obesity   . Osteopenia   . Pancreatitis   . PE (pulmonary embolism)   . Proteinuria   . Sarcoidosis   . Smoker     Patient Active Problem List   Diagnosis Date Noted  . Acute gouty arthritis 03/07/2017  . Arthralgia of left foot 03/07/2017  . Nephrolithiasis   . Fever, unspecified 11/02/2015  . Essential hypertension 02/07/2015  . Pulmonary embolism ([place]) 01/30/2013  . Sarcoidosis 01/30/2013  . Hemoptysis 01/30/2013  . SHOULDER PAIN, LEFT 11/28/2009    Past Surgical History:  Procedure Laterality Date  . ABDOMINAL EXPLORATION SURGERY    . BACK SURGERY    . CHOLECYSTECTOMY    . SHOULDER SURGERY         Home Medications    Prior to Admission medications   Medication Sig Start Date End Date Taking? Authorizing Provider  albuterol (PROVENTIL HFA) 108 (90 Base) MCG/ACT inhaler Inhale 2 puffs into the lungs every 4 (four) hours as needed for wheezing or shortness of breath. Needs office visit for any refills. 01/05/17   Shawnee Knapp, MD   aspirin 81 MG tablet Take 1 tablet (81 mg total) by mouth daily. 02/01/13   [name], MD  atorvastatin (LIPITOR) 10 MG tablet Take 1 tablet (10 mg total) by mouth daily. 11/17/12   [name], MD  busPIRone (BUSPAR) 7.5 MG tablet Take 7.5 mg by mouth every morning. Reported on 11/02/2015 01/18/15   [provider]  diazepam (VALIUM) 5 MG tablet Reported on 11/02/2015 12/25/14   [provider]  diltiazem (CARDIZEM CD) 360 MG 24 hr capsule Take 360 mg by mouth daily.    [provider]  enalapril (VASOTEC) 10 MG tablet Take 10 mg by mouth daily. Reported on 11/02/2015 01/22/15   [provider]  furosemide (LASIX) 20 MG tablet Reported on 11/02/2015 01/22/15   [provider]  gabapentin (NEURONTIN) 300 MG capsule Reported on 11/02/2015 01/09/15   [provider]  Guaifenesin (MUCINEX MAXIMUM STRENGTH) 1200 MG TB12 Take 1 tablet (1,200 mg total) by mouth every 12 (twelve) hours as needed. Patient not taking: Reported on 03/07/2017 11/02/15   Shawnee Knapp, MD  hydrALAZINE (APRESOLINE) 25 MG tablet Take 25 mg by mouth 2 (two) times daily.    [provider]  hydrochlorothiazide (HYDRODIURIL) 12.5 MG tablet Take 12.5 mg by mouth daily.  [provider]  labetalol (NORMODYNE) 300 MG tablet Take 300 mg by mouth 3 (three) times daily.    [provider]  levofloxacin (LEVAQUIN) 500 MG tablet Take 1 tablet (500 mg total) by mouth daily. 11/02/15   Shawnee Knapp, MD  olmesartan (BENICAR) 40 MG tablet Take 40 mg by mouth daily.    [provider]  predniSONE (DELTASONE) 20 MG tablet Take 2 tablets (40 mg total) by mouth daily with breakfast. 11/02/15   Shawnee Knapp, MD  predniSONE (DELTASONE) 5 MG tablet Take 5 mg by mouth daily. Reported on 11/02/2015    [provider]  Spacer/Aero-Holding Chambers ([place]) [place] 1 each by Other route once. Patient not taking: Reported on 03/07/2017 11/02/15   Shawnee Knapp, MD  traMADol Veatrice [place]) 50 MG tablet Reported on 11/02/2015 01/09/15   [provider]  vardenafil (LEVITRA) 10 MG tablet Take 10 mg by mouth as needed for erectile dysfunction. Reported on 11/02/2015    [provider]  zolpidem (AMBIEN) 10 MG tablet Take 5 mg by mouth at bedtime as needed for sleep.    [provider]    Family History Family History  Problem Relation Age of Onset  . Hypertension Father     Social History Social History   Tobacco Use  . Smoking status: Former Smoker    Types: Cigarettes  . Smokeless tobacco: Never Used  Substance Use Topics  . Alcohol use: No    Alcohol/week: 0.0 oz  . Drug use: No     Allergies   Codeine and Hydromorphone   Review of Systems Review of Systems  All systems reviewed and negative, other than as noted in HPI.  Physical Exam Updated Vital Signs BP (!) 162/103   Pulse 74   Temp 99 F (37.2 C) (Oral)   Resp 16   SpO2 97%   Physical Exam  Constitutional: He appears well-developed and well-nourished. No distress.  HENT:  Head: Normocephalic and atraumatic.  Eyes: Conjunctivae are normal. Right eye exhibits no discharge. Left eye exhibits no discharge.  Neck: Neck supple.  Cardiovascular: Normal rate, regular rhythm and normal heart sounds. Exam reveals no gallop and no friction rub.  No murmur heard. Pulmonary/Chest: Effort normal and breath sounds normal. No respiratory distress.  Abdominal: Soft. He exhibits no distension. There is no tenderness.  Musculoskeletal: He exhibits no edema or tenderness.  Mild swelling of right upper extremity most noticeable at the forearm and wrist.  There is no erythema or concerning skin lesions noted.  Easily palpable radial pulse.  His hand/fingers are warm to touch with brisk cap refill.  He has pain with both passive and active range of motion of the shoulder, elbow and wrist.  Point tender over anterior right shoulder and supraclavicular tissues.  No  mass noted.  Neurological: He is alert.  Skin: Skin is warm and dry.  Psychiatric: He has a normal mood and affect. His behavior is normal. Thought content normal.  Nursing note and vitals reviewed.    ED Treatments / Results  Labs (all labs ordered are listed, but only abnormal results are displayed) Labs Reviewed - No data to display  EKG  EKG Interpretation None       Radiology Dg Chest 2 View  Result Date: 07/30/2017 CLINICAL DATA:  61 year old male with pain in right arm radiating from shoulder. EXAM: CHEST  2 VIEW COMPARISON:  11/02/2015 FINDINGS: The heart size and mediastinal contours are  within normal limits. Stable, mild prominence of the interstitium. Note is made of bibasilar atelectasis. No focal parenchymal opacity, pleural effusion or pneumothorax. The visualized skeletal structures are unremarkable. IMPRESSION: Stable radiographic appearance of the chest without acute intrathoracic pathology. Stable prominence of the interstitium and bibasilar atelectasis. Electronically Signed   By: Kristopher Oppenheim M.D.   On: 07/30/2017 20:20   Dg Shoulder Right  Result Date: 07/31/2017 CLINICAL DATA:  Right shoulder and arm pain for 2 days. No known injury. EXAM: RIGHT SHOULDER - 2+ VIEW COMPARISON:  MRI right shoulder 08/19/2012. Plain films of the acromioclavicular joints 11/24/2009. FINDINGS: There is no evidence of fracture or dislocation. Mild acromioclavicular osteoarthritis noted. Soft tissues are unremarkable. IMPRESSION: No acute abnormality.  Mild acromioclavicular osteoarthritis. Electronically Signed   By: Inge Rise M.D.   On: 07/31/2017 10:59    Procedures Procedures (including critical care time)  Medications Ordered in ED Medications  sodium chloride 0.9 % bolus 1,000 mL (1,000 mLs Intravenous New Bag/Given 07/31/17 1023)  ketorolac (TORADOL) 15 MG/ML injection 15 mg (15 mg Intravenous Given 07/31/17 1025)  dexamethasone (DECADRON) injection 10 mg (10 mg  Intravenous Given 07/31/17 1024)  LORazepam (ATIVAN) injection 1 mg (1 mg Intravenous Given 07/31/17 1023)  fentaNYL (SUBLIMAZE) injection 100 mcg (100 mcg Intravenous Given 07/31/17 1024)     Initial Impression / Assessment and Plan / ED Course  I have reviewed the triage vital signs and the nursing notes.  Pertinent labs & imaging results that were available during my care of the patient were reviewed by me and considered in my medical decision making (see chart for details).     61yM with atraumatic right upper extremity pain and swelling.  Unclear etiology.  Negative ultrasound.   He has intact radial pulse.  His arm appears well perfused.  There are no concerning skin lesions.  He does not have any significant acute pain in any large joints aside from in his right upper extremity. Thoracic outlet syndrome? Plain films fine. At this point, will tx symptomatically. Outpt FU for persistent symptoms. Emergent return precautions discussed.   Final Clinical Impressions(s) / ED Diagnoses   Final diagnoses:  Pain in right arm    ED Discharge Orders    None       Virgel Manifold, MD 07/31/17 1133

## 2017-07-31 NOTE — Progress Notes (Signed)
VASCULAR LAB PRELIMINARY  PRELIMINARY  PRELIMINARY  PRELIMINARY  Right upper extremity venous duplex completed.    Preliminary report:  There is no DVT or SVT noted in the right upper extremity.   Trevaris Pennella, RVT 07/31/2017, 8:49 AM

## 2017-07-31 NOTE — ED Triage Notes (Signed)
Patient here with ongoing right arm pain x 2 days, seen at [facility] ED yesterday and started on xarelto, had negative DVT study this am, here for ongoing pain

## 2017-07-31 NOTE — ED Notes (Signed)
Pt stable, ambulatory, and verbalizes understanding of d/c instructions.  

## 2017-08-02 ENCOUNTER — Ambulatory Visit
Admission: RE | Admit: 2017-08-02 | Discharge: 2017-08-02 | Disposition: A | Discharge status: Home / Self Care | Payer: BLUE CROSS/BLUE SHIELD | Source: Ambulatory Visit | Attending: Internal Medicine | Admitting: Internal Medicine

## 2017-08-02 ENCOUNTER — Other Ambulatory Visit: Payer: Self-pay | Admitting: Internal Medicine

## 2017-08-02 DIAGNOSIS — R410 Disorientation, unspecified: Secondary | ICD-10-CM

## 2017-08-19 ENCOUNTER — Ambulatory Visit (INDEPENDENT_AMBULATORY_CARE_PROVIDER_SITE_OTHER): Payer: BLUE CROSS/BLUE SHIELD

## 2017-08-19 ENCOUNTER — Ambulatory Visit (INDEPENDENT_AMBULATORY_CARE_PROVIDER_SITE_OTHER): Payer: BLUE CROSS/BLUE SHIELD | Admitting: Orthopaedic Surgery

## 2017-08-19 DIAGNOSIS — M25512 Pain in left shoulder: Secondary | ICD-10-CM

## 2017-08-19 MED ORDER — MORPHINE SULFATE 15 MG PO TABS: 15.0000 mg | ORAL_TABLET | ORAL | 0 refills | Status: DC | PRN

## 2017-08-19 MED ORDER — ONDANSETRON HCL 4 MG PO TABS: 4.0000 mg | ORAL_TABLET | Freq: Three times a day (TID) | ORAL | 0 refills | Status: DC | PRN

## 2017-08-19 MED ORDER — PROMETHAZINE HCL 25 MG PO TABS: 25.0000 mg | ORAL_TABLET | Freq: Four times a day (QID) | ORAL | 1 refills | Status: DC | PRN

## 2017-08-19 NOTE — Progress Notes (Signed)
Office Visit Note   [name]           Date of Birth: 1955-10-16           MRN: [id number] Visit Date: 08/19/2017              Requested by: [name], [place], [place] [address] PCP: [name], MD   Assessment & Plan: Visit Diagnoses:  1. Acute pain of left shoulder     Plan: Impression is left shoulder pain possible rotator cuff rupture.  Difficult to assess whether patient's symptoms are truly from rotator cuff tear or just from pain.  Prescription for MS Contin was prescribed today.  Patient has multiple allergies and intolerances to other pain medicines.  I recommend that he follow-up with either Dr. Ninfa Linden or Artis Delay in the next couple weeks for reexamination and consideration of possible advanced imaging if he does not improve  Follow-Up Instructions: Return for 2 weeks with Dr. Ninfa Linden or Artis Delay.   Orders:  Orders Placed This Encounter  Procedures  . XR Shoulder Left   Meds ordered this encounter  Medications  . morphine (MSIR) 15 MG tablet    Sig: Take 1 tablet (15 mg total) by mouth every 4 (four) hours as needed for severe pain.    Dispense:  30 tablet    Refill:  0  . ondansetron (ZOFRAN) 4 MG tablet    Sig: Take 1-2 tablets (4-8 mg total) by mouth every 8 (eight) hours as needed for nausea or vomiting.    Dispense:  40 tablet    Refill:  0  . promethazine (PHENERGAN) 25 MG tablet    Sig: Take 1 tablet (25 mg total) by mouth every 6 (six) hours as needed for nausea.    Dispense:  30 tablet    Refill:  1      Procedures: No procedures performed   Clinical Data: No additional findings.   Subjective: No chief complaint on file.   Patient is 61 year old man comes in with acute left shoulder pain status post fall directly onto the left shoulder this morning about the trash.  He has had a history of 2 rotator cuff repairs on the same shoulder by Dr. Ninfa Linden years ago.  He is not having a lot of pain and inability to move  his arm away from the side of his body.  Denies any numbness and tingling.  Pain is worse with use of the arm and better with immobilization.    Review of Systems  Constitutional: Negative.   All other systems reviewed and are negative.    Objective: Vital Signs: There were no vitals taken for this visit.  Physical Exam  Constitutional: He is oriented to person, place, and time. He appears well-developed and well-nourished.  HENT:  Head: Normocephalic and atraumatic.  Eyes: Pupils are equal, round, and reactive to light.  Neck: Neck supple.  Pulmonary/Chest: Effort normal.  Abdominal: Soft.  Musculoskeletal: Normal range of motion.  Neurological: He is alert and oriented to person, place, and time.  Skin: Skin is warm.  Psychiatric: He has a normal mood and affect. His behavior is normal. Judgment and thought content normal.  Nursing note and vitals reviewed.   Ortho Exam Left shoulder is significantly limited secondary to pain and guarding.  He will not allow me to move the shoulder away from the side of his body.  Infraspinatus was significantly weak secondary to pain.  Supraspinatus testing was not  possible secondary to pain and guarding Specialty Comments:  No specialty comments available.  Imaging: No results found.   PMFS History: Patient Active Problem List   Diagnosis Date Noted  . Acute gouty arthritis 03/07/2017  . Arthralgia of left foot 03/07/2017  . Nephrolithiasis   . Fever, unspecified 11/02/2015  . Essential hypertension 02/07/2015  . Pulmonary embolism ([place]) 01/30/2013  . Sarcoidosis 01/30/2013  . Hemoptysis 01/30/2013  . SHOULDER PAIN, LEFT 11/28/2009   Past Medical History:  Diagnosis Date  . Back pain   . CKD (chronic kidney disease), stage III ([place])   . CVA (cerebral infarction)   . Diastolic dysfunction   . ED (erectile dysfunction)   . GERD (gastroesophageal reflux disease)   . Hypercalcemia   . Hyperlipidemia   . Hypertension   .  Insomnia   . Long-term use of high-risk medication   . Nephrocalcinosis   . Nephrolithiasis   . Obesity   . Osteopenia   . Pancreatitis   . PE (pulmonary embolism)   . Proteinuria   . Sarcoidosis   . Smoker     Family History  Problem Relation Age of Onset  . Hypertension Father     Past Surgical History:  Procedure Laterality Date  . ABDOMINAL EXPLORATION SURGERY    . BACK SURGERY    . CHOLECYSTECTOMY    . SHOULDER SURGERY     Social History   Occupational History  . Not on file  Tobacco Use  . Smoking status: Former Smoker    Types: Cigarettes  . Smokeless tobacco: Never Used  Substance and Sexual Activity  . Alcohol use: No    Alcohol/week: 0.0 oz  . Drug use: No  . Sexual activity: Yes

## 2017-09-12 ENCOUNTER — Ambulatory Visit (INDEPENDENT_AMBULATORY_CARE_PROVIDER_SITE_OTHER): Payer: BLUE CROSS/BLUE SHIELD | Admitting: Orthopaedic Surgery

## 2017-09-13 DIAGNOSIS — M5116 Intervertebral disc disorders with radiculopathy, lumbar region: Secondary | ICD-10-CM | POA: Insufficient documentation

## 2017-09-28 ENCOUNTER — Ambulatory Visit (INDEPENDENT_AMBULATORY_CARE_PROVIDER_SITE_OTHER): Payer: BLUE CROSS/BLUE SHIELD | Admitting: Orthopaedic Surgery

## 2018-08-15 ENCOUNTER — Other Ambulatory Visit: Payer: Self-pay | Admitting: Family Medicine

## 2018-08-15 ENCOUNTER — Ambulatory Visit: Payer: BLUE CROSS/BLUE SHIELD

## 2018-08-15 DIAGNOSIS — M545 Low back pain, unspecified: Secondary | ICD-10-CM

## 2018-11-21 IMAGING — MR MR LUMBAR SPINE W/O CM
4 of 5 series · 25 of 48 positions shown · non-contrast
Comparison: 12/18/2014

CLINICAL DATA: Low back pain, right-sided pain radiating to the
right knee

EXAM:
MRI LUMBAR SPINE WITHOUT CONTRAST
TECHNIQUE: Multiplanar, multisequence MR imaging of the lumbar spine was
performed. No intravenous contrast was administered.

[Series 3: T2 · sagittal · 4.0mm · 0.55mm/px · 7 of 15 slices shown (1 of 2)]
[im 1/15]
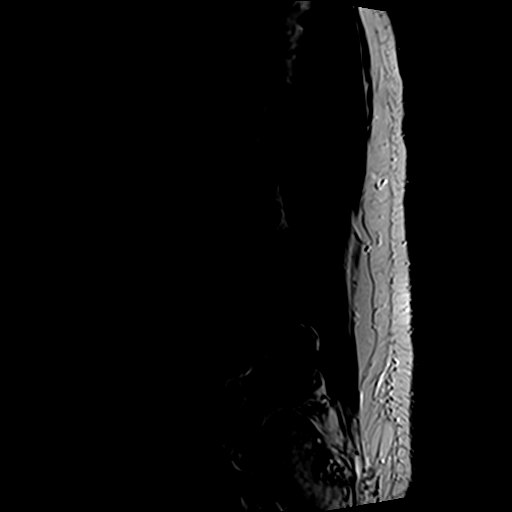
[im 3/15]
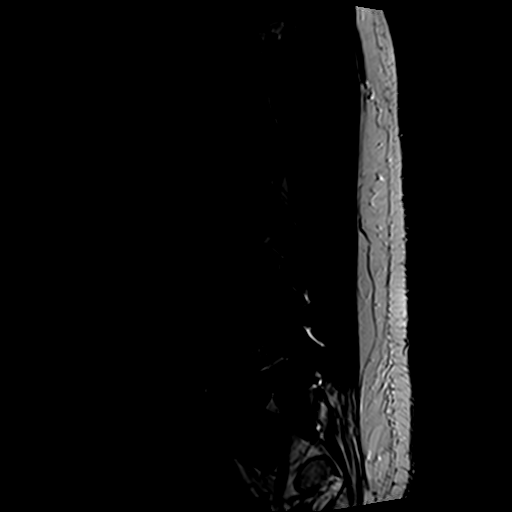
[im 5/15]
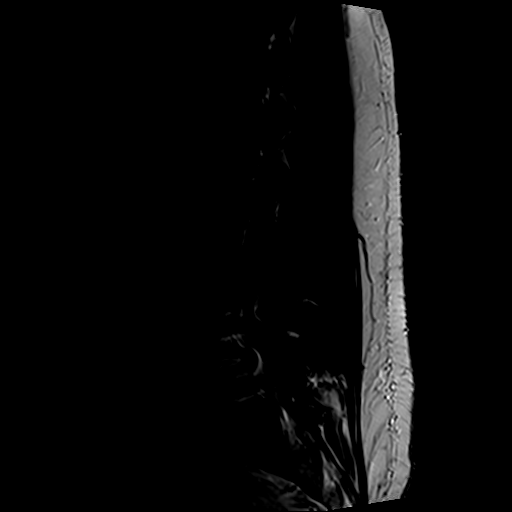
[im 8/15]
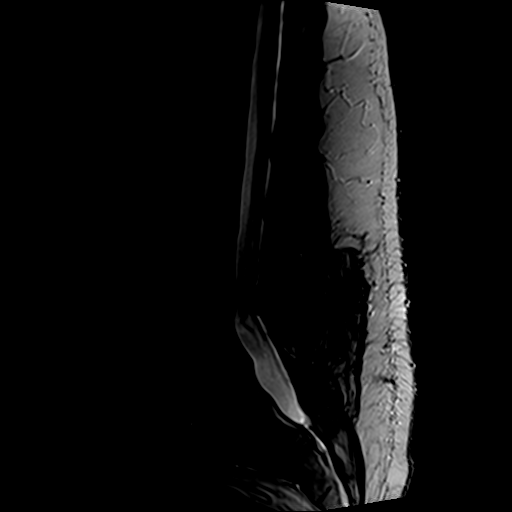
[im 10/15]
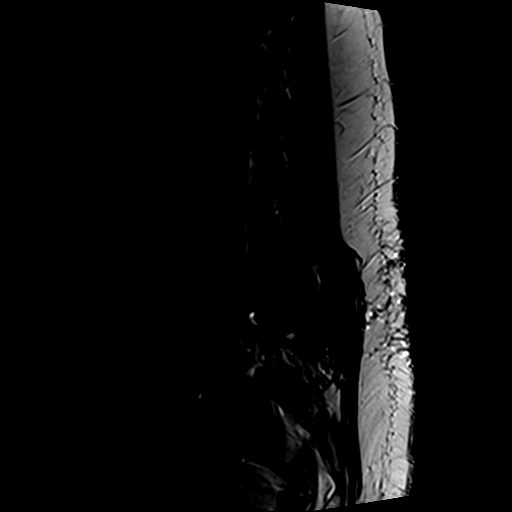
[im 12/15]
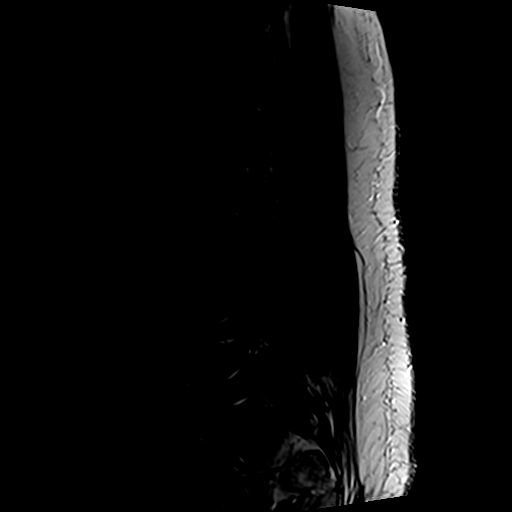
[im 15/15]
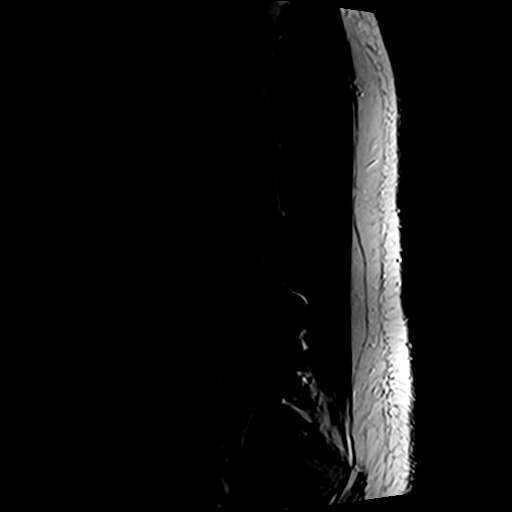

[Series 4: T1 · sagittal · 4.0mm · 0.55mm/px · 7 of 15 slices shown (1 of 2)]
[im 1/15]
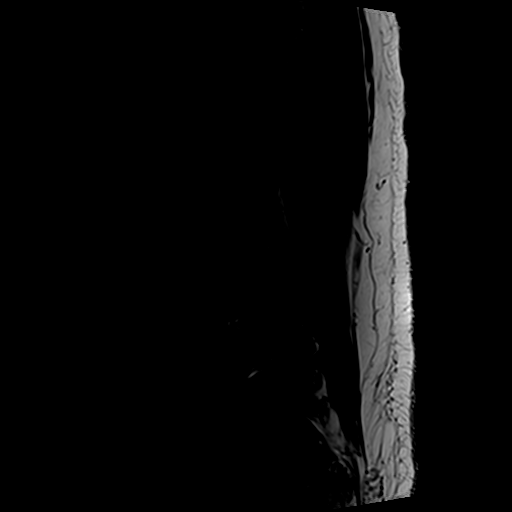
[im 3/15]
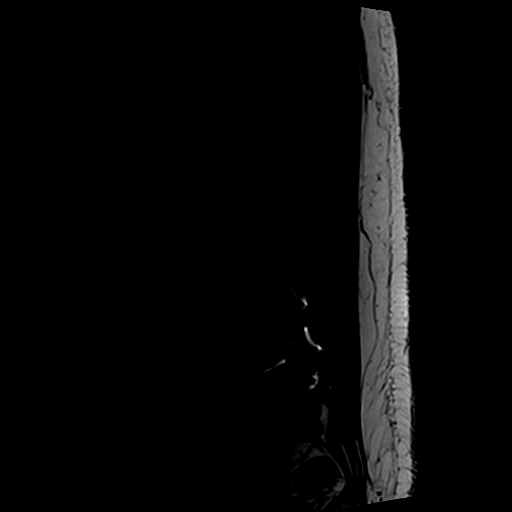
[im 5/15]
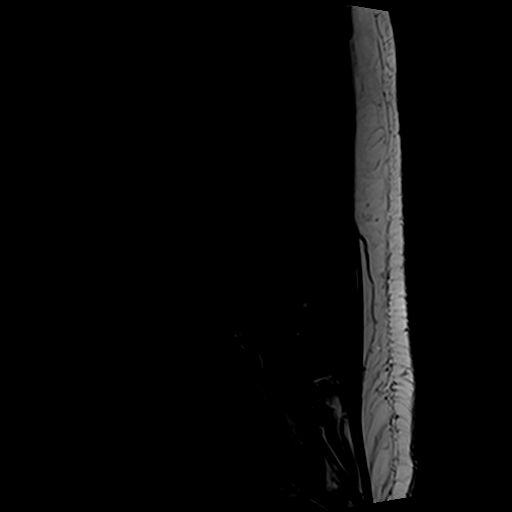
[im 8/15]
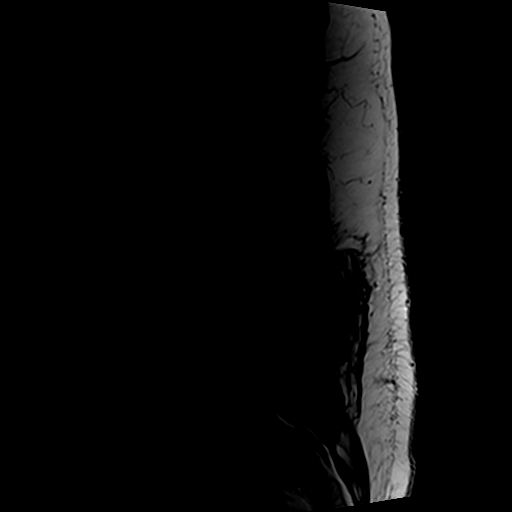
[im 10/15]
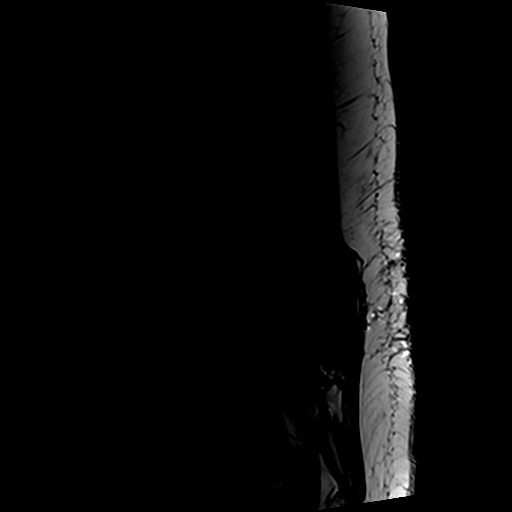
[im 12/15]
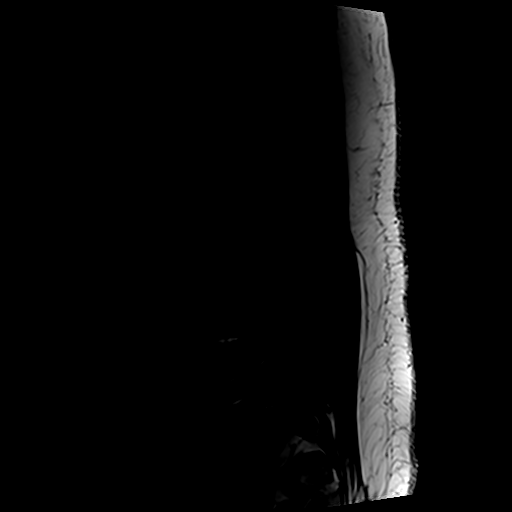
[im 15/15]
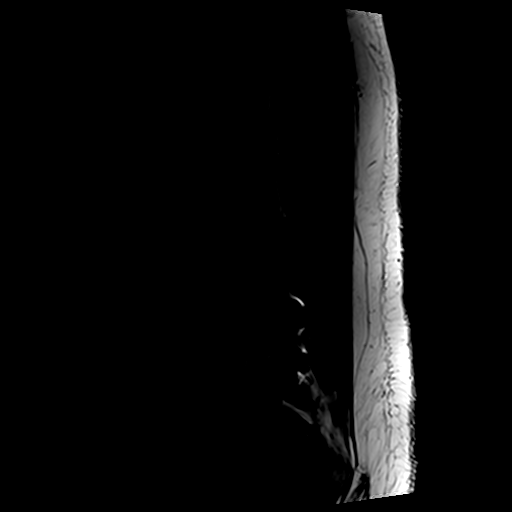

[Series 6: T2 · axial · 4.0mm · 0.70mm/px · z∈[-57,+135]mm · 8 of 34 slices shown (2 of 2)]
[im 1/34]
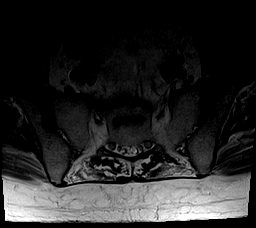
[im 6/34]
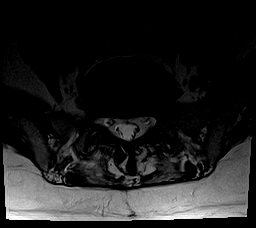
[im 11/34]
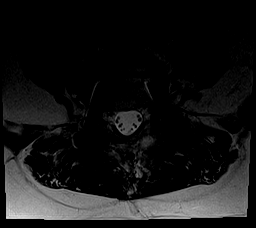
[im 16/34]
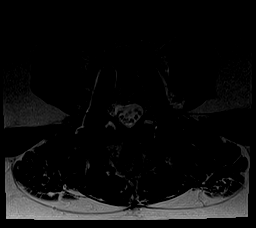
[im 18/34]
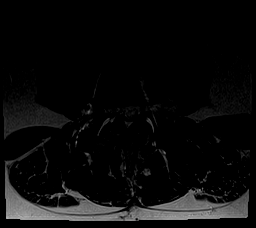
[im 23/34]
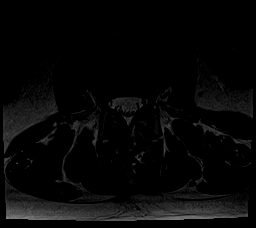
[im 28/34]
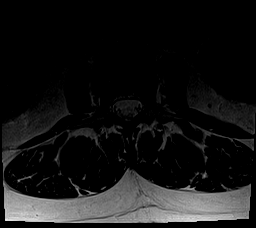
[im 34/34]
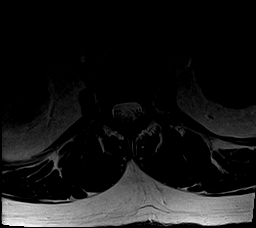

[Series 7: T1 · axial · 4.0mm · 0.35mm/px · z∈[-32,+104]mm · 3 of 34 slices shown (2 of 2)]
[im 6/34]
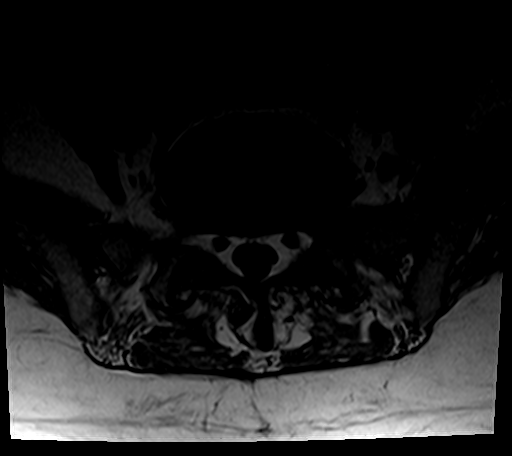
[im 18/34]
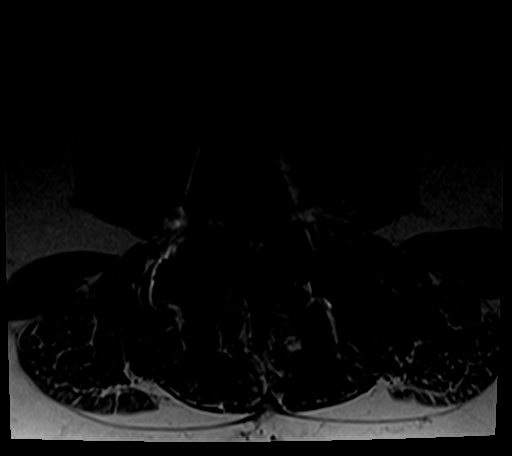
[im 28/34]
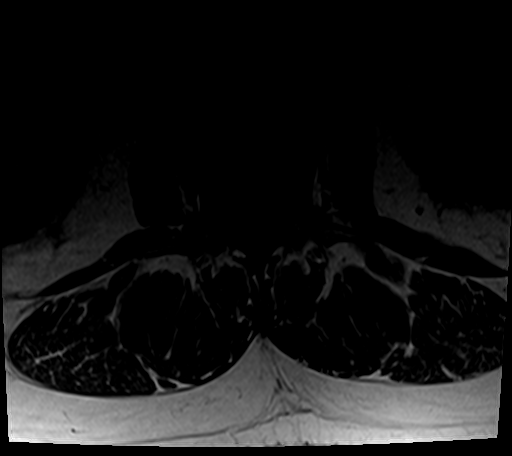

[25 of 48 positions shown; findings below may reference images not displayed]

FINDINGS: Segmentation:  Standard.

Alignment:  Physiologic.

Vertebrae: No fracture, evidence of discitis, or bone lesion. Mild
osteoarthritis of bilateral sacroiliac joints.

Conus medullaris: Extends to the T12 level and appears normal.

Paraspinal and other soft tissues: Postsurgical changes in the
posterior paraspinal soft tissues at L4-5. No other paraspinal
abnormality.

Disc levels:

Disc spaces: Posterior lumbar interbody fusion at L4-5. Disc
desiccation at L3-4 and L5-S1.

T12-L1: No significant disc bulge. No evidence of neural foraminal
stenosis. No central canal stenosis.

L1-L2: No significant disc bulge. No evidence of neural foraminal
stenosis. No central canal stenosis.

L2-L3: No significant disc bulge. No evidence of neural foraminal
stenosis. No central canal stenosis. Mild bilateral facet
arthropathy.

L3-L4: Mild broad-based disc bulge. Severe bilateral facet
arthropathy with ligamentum flavum infolding. Moderate-severe spinal
stenosis and bilateral lateral recess stenosis, right greater than
left. Mild right foraminal narrowing. No left foraminal narrowing.

L4-L5: Interbody fusion. No evidence of neural foraminal stenosis.
No central canal stenosis.

L5-S1: Mild broad-based disc bulge. Moderate bilateral facet
arthropathy. No evidence of neural foraminal stenosis. No central
canal stenosis.
IMPRESSION: 1. At L3-4 there is a mild broad-based disc bulge. Severe bilateral
facet arthropathy with ligamentum flavum infolding. Moderate-severe
spinal stenosis and bilateral lateral recess stenosis, right greater
than left. Mild right foraminal narrowing.
2. Posterior lumbar interbody fusion at L4-5.

## 2019-02-28 DIAGNOSIS — M96 Pseudarthrosis 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: Secondary | ICD-10-CM | POA: Insufficient documentation

## 2019-03-14 ENCOUNTER — Emergency Department (HOSPITAL_COMMUNITY): Payer: BC Managed Care – PPO

## 2019-03-14 ENCOUNTER — Inpatient Hospital Stay (HOSPITAL_COMMUNITY): Payer: BC Managed Care – PPO

## 2019-03-14 ENCOUNTER — Inpatient Hospital Stay (HOSPITAL_COMMUNITY)
Admission: EM | Admit: 2019-03-14 | Discharge: 2019-03-15 | DRG: 948 | Disposition: A | Discharge status: Home Health | Payer: BC Managed Care – PPO | Attending: Family Medicine | Admitting: Family Medicine

## 2019-03-14 ENCOUNTER — Encounter (HOSPITAL_COMMUNITY): Payer: Self-pay | Admitting: Emergency Medicine

## 2019-03-14 ENCOUNTER — Other Ambulatory Visit: Payer: Self-pay

## 2019-03-14 DIAGNOSIS — Z6834 Body mass index (BMI) 34.0-34.9, adult: Secondary | ICD-10-CM | POA: Diagnosis not present

## 2019-03-14 DIAGNOSIS — R410 Disorientation, unspecified: Secondary | ICD-10-CM | POA: Diagnosis present

## 2019-03-14 DIAGNOSIS — Z8673 Personal history of transient ischemic attack (TIA), and cerebral infarction without residual deficits: Secondary | ICD-10-CM

## 2019-03-14 DIAGNOSIS — I351 Nonrheumatic aortic (valve) insufficiency: Secondary | ICD-10-CM | POA: Diagnosis present

## 2019-03-14 DIAGNOSIS — Z79899 Other long term (current) drug therapy: Secondary | ICD-10-CM | POA: Diagnosis not present

## 2019-03-14 DIAGNOSIS — G9389 Other specified disorders of brain: Secondary | ICD-10-CM | POA: Diagnosis present

## 2019-03-14 DIAGNOSIS — Z87891 Personal history of nicotine dependence: Secondary | ICD-10-CM | POA: Diagnosis not present

## 2019-03-14 DIAGNOSIS — I5189 Other ill-defined heart diseases: Secondary | ICD-10-CM

## 2019-03-14 DIAGNOSIS — Z1159 Encounter for screening for other viral diseases: Secondary | ICD-10-CM

## 2019-03-14 DIAGNOSIS — Z86711 Personal history of pulmonary embolism: Secondary | ICD-10-CM

## 2019-03-14 DIAGNOSIS — D869 Sarcoidosis, unspecified: Secondary | ICD-10-CM | POA: Diagnosis present

## 2019-03-14 DIAGNOSIS — Z8249 Family history of ischemic heart disease and other diseases of the circulatory system: Secondary | ICD-10-CM

## 2019-03-14 DIAGNOSIS — I639 Cerebral infarction, unspecified: Secondary | ICD-10-CM | POA: Insufficient documentation

## 2019-03-14 DIAGNOSIS — E669 Obesity, unspecified: Secondary | ICD-10-CM | POA: Diagnosis present

## 2019-03-14 DIAGNOSIS — T50915A Adverse effect of multiple unspecified drugs, medicaments and biological substances, initial encounter: Secondary | ICD-10-CM | POA: Diagnosis present

## 2019-03-14 DIAGNOSIS — Z7982 Long term (current) use of aspirin: Secondary | ICD-10-CM

## 2019-03-14 DIAGNOSIS — R4182 Altered mental status, unspecified: Secondary | ICD-10-CM | POA: Diagnosis present

## 2019-03-14 DIAGNOSIS — E785 Hyperlipidemia, unspecified: Secondary | ICD-10-CM | POA: Diagnosis present

## 2019-03-14 DIAGNOSIS — Z86718 Personal history of other venous thrombosis and embolism: Secondary | ICD-10-CM

## 2019-03-14 DIAGNOSIS — I10 Essential (primary) hypertension: Secondary | ICD-10-CM | POA: Diagnosis present

## 2019-03-14 DIAGNOSIS — R350 Frequency of micturition: Secondary | ICD-10-CM | POA: Diagnosis present

## 2019-03-14 DIAGNOSIS — Z885 Allergy status to narcotic agent status: Secondary | ICD-10-CM | POA: Diagnosis not present

## 2019-03-14 DIAGNOSIS — Z7952 Long term (current) use of systemic steroids: Secondary | ICD-10-CM

## 2019-03-14 DIAGNOSIS — N401 Enlarged prostate with lower urinary tract symptoms: Secondary | ICD-10-CM | POA: Diagnosis present

## 2019-03-14 DIAGNOSIS — N183 Chronic kidney disease, stage 3 (moderate): Secondary | ICD-10-CM | POA: Diagnosis present

## 2019-03-14 DIAGNOSIS — N1832 Chronic kidney disease, stage 3b: Secondary | ICD-10-CM | POA: Diagnosis present

## 2019-03-14 DIAGNOSIS — I129 Hypertensive chronic kidney disease with stage 1 through stage 4 chronic kidney disease, or unspecified chronic kidney disease: Secondary | ICD-10-CM | POA: Diagnosis present

## 2019-03-14 LAB — COMPREHENSIVE METABOLIC PANEL
ALT: 27 U/L (ref 0–44)
AST: 27 U/L (ref 15–41)
Albumin: 3.3 g/dL — ABNORMAL LOW (ref 3.5–5.0)
Alkaline Phosphatase: 88 U/L (ref 38–126)
Anion gap: 10 (ref 5–15)
BUN: 7 mg/dL — ABNORMAL LOW (ref 8–23)
CO2: 29 mmol/L (ref 22–32)
Calcium: 9.4 mg/dL (ref 8.9–10.3)
Chloride: 100 mmol/L (ref 98–111)
Creatinine, Ser: 1.48 mg/dL — ABNORMAL HIGH (ref 0.61–1.24)
GFR calc Af Amer: 58 mL/min — ABNORMAL LOW (ref >60)
GFR calc non Af Amer: 50 mL/min — ABNORMAL LOW (ref >60)
Glucose, Bld: 120 mg/dL — ABNORMAL HIGH (ref 70–99)
Potassium: 3.6 mmol/L (ref 3.5–5.1)
Sodium: 139 mmol/L (ref 135–145)
Total Bilirubin: 0.8 mg/dL (ref 0.3–1.2)
Total Protein: 6.9 g/dL (ref 6.5–8.1)

## 2019-03-14 LAB — URINALYSIS, ROUTINE W REFLEX MICROSCOPIC
Bacteria, UA: NONE SEEN
Bilirubin Urine: NEGATIVE
Glucose, UA: NEGATIVE mg/dL
Hgb urine dipstick: NEGATIVE
Ketones, ur: NEGATIVE mg/dL
Leukocytes,Ua: NEGATIVE
Nitrite: NEGATIVE
Protein, ur: >=300 mg/dL — AB
Specific Gravity, Urine: 1.011 (ref 1.005–1.030)
pH: 7 (ref 5.0–8.0)

## 2019-03-14 LAB — CBC
HCT: 37.4 % — ABNORMAL LOW (ref 39.0–52.0)
Hemoglobin: 12.6 g/dL — ABNORMAL LOW (ref 13.0–17.0)
MCH: 29.9 pg (ref 26.0–34.0)
MCHC: 33.7 g/dL (ref 30.0–36.0)
MCV: 88.6 fL (ref 80.0–100.0)
Platelets: 370 10*3/uL (ref 150–400)
RBC: 4.22 MIL/uL (ref 4.22–5.81)
RDW: 12.9 % (ref 11.5–15.5)
WBC: 11.2 10*3/uL — ABNORMAL HIGH (ref 4.0–10.5)
nRBC: 0 % (ref 0.0–0.2)

## 2019-03-14 LAB — ACETAMINOPHEN LEVEL: Acetaminophen (Tylenol), Serum: <10 ug/mL — ABNORMAL LOW (ref 10–30)

## 2019-03-14 LAB — RAPID URINE DRUG SCREEN, HOSP PERFORMED
Amphetamines: NOT DETECTED
Barbiturates: NOT DETECTED
Benzodiazepines: NOT DETECTED
Cocaine: NOT DETECTED
Opiates: POSITIVE — AB
Tetrahydrocannabinol: NOT DETECTED

## 2019-03-14 LAB — SARS CORONAVIRUS 2 BY RT PCR (HOSPITAL ORDER, PERFORMED IN ~~LOC~~ HOSPITAL LAB): SARS Coronavirus 2: NEGATIVE

## 2019-03-14 LAB — ETHANOL: Alcohol, Ethyl (B): <10 mg/dL (ref <10)

## 2019-03-14 LAB — CBG MONITORING, ED: Glucose-Capillary: 118 mg/dL — ABNORMAL HIGH (ref 70–99)

## 2019-03-14 LAB — SALICYLATE LEVEL: Salicylate Lvl: <7 mg/dL (ref 2.8–30.0)

## 2019-03-14 LAB — VITAMIN B12: Vitamin B-12: 671 pg/mL (ref 180–914)

## 2019-03-14 LAB — AMMONIA: Ammonia: 22 umol/L (ref 9–35)

## 2019-03-14 LAB — TSH: TSH: 1.259 u[IU]/mL (ref 0.350–4.500)

## 2019-03-14 LAB — FOLATE: Folate: 12.4 ng/mL (ref >5.9)

## 2019-03-14 MED ORDER — ENOXAPARIN SODIUM 40 MG/0.4ML ~~LOC~~ SOLN
40.0000 mg | SUBCUTANEOUS | Status: DC
  Administered 2019-03-14: 40 mg via SUBCUTANEOUS
  Filled 2019-03-14: qty 0.4

## 2019-03-14 MED ORDER — SODIUM CHLORIDE 0.9% FLUSH
3.0000 mL | Freq: Once | INTRAVENOUS | Status: AC
  Administered 2019-03-14: 13:00:00 3 mL via INTRAVENOUS

## 2019-03-14 MED ORDER — HYDROCODONE-ACETAMINOPHEN 10-325 MG PO TABS
1.0000 | ORAL_TABLET | Freq: Four times a day (QID) | ORAL | Status: DC | PRN
  Administered 2019-03-14 – 2019-03-15 (×2): 1 via ORAL
  Filled 2019-03-14 (×2): qty 1

## 2019-03-14 MED ORDER — ATORVASTATIN CALCIUM 10 MG PO TABS
20.0000 mg | ORAL_TABLET | Freq: Every day | ORAL | Status: DC
  Administered 2019-03-14: 20 mg via ORAL
  Filled 2019-03-14: qty 2

## 2019-03-14 MED ORDER — MELATONIN 3 MG PO TABS
12.0000 mg | ORAL_TABLET | Freq: Every day | ORAL | Status: DC
  Administered 2019-03-14: 12 mg via ORAL
  Filled 2019-03-14 (×2): qty 4

## 2019-03-14 MED ORDER — ACETAMINOPHEN 650 MG RE SUPP: 650.0000 mg | RECTAL | Status: DC | PRN

## 2019-03-14 MED ORDER — PNEUMOCOCCAL VAC POLYVALENT 25 MCG/0.5ML IJ INJ: 0.5000 mL | INJECTION | INTRAMUSCULAR | Status: DC

## 2019-03-14 MED ORDER — ACETAMINOPHEN 325 MG PO TABS: 650.0000 mg | ORAL_TABLET | ORAL | Status: DC | PRN

## 2019-03-14 MED ORDER — TAMSULOSIN HCL 0.4 MG PO CAPS
0.4000 mg | ORAL_CAPSULE | Freq: Every day | ORAL | Status: DC
  Administered 2019-03-15: 0.4 mg via ORAL
  Filled 2019-03-14: qty 1

## 2019-03-14 MED ORDER — ALBUTEROL SULFATE (2.5 MG/3ML) 0.083% IN NEBU
2.5000 mg | INHALATION_SOLUTION | RESPIRATORY_TRACT | Status: DC | PRN
  Administered 2019-03-15: 2.5 mg via RESPIRATORY_TRACT
  Filled 2019-03-14: qty 3

## 2019-03-14 MED ORDER — SODIUM CHLORIDE 0.9 % IV BOLUS
1000.0000 mL | Freq: Once | INTRAVENOUS | Status: AC
  Administered 2019-03-14: 1000 mL via INTRAVENOUS

## 2019-03-14 MED ORDER — QUETIAPINE FUMARATE 25 MG PO TABS
25.0000 mg | ORAL_TABLET | Freq: Every day | ORAL | Status: DC
  Administered 2019-03-14: 25 mg via ORAL
  Filled 2019-03-14 (×2): qty 1

## 2019-03-14 MED ORDER — IOHEXOL 350 MG/ML SOLN
100.0000 mL | Freq: Once | INTRAVENOUS | Status: AC | PRN
  Administered 2019-03-14: 100 mL via INTRAVENOUS

## 2019-03-14 MED ORDER — ASPIRIN EC 81 MG PO TBEC
80.0000 mg | DELAYED_RELEASE_TABLET | Freq: Every day | ORAL | Status: DC
  Administered 2019-03-14 – 2019-03-15 (×2): 81 mg via ORAL
  Filled 2019-03-14 (×2): qty 1

## 2019-03-14 MED ORDER — MELATONIN 5 MG PO TABS
12.5000 mg | ORAL_TABLET | Freq: Every day | ORAL | Status: DC
  Filled 2019-03-14: qty 2.5

## 2019-03-14 MED ORDER — LABETALOL HCL 200 MG PO TABS
300.0000 mg | ORAL_TABLET | Freq: Three times a day (TID) | ORAL | Status: DC
  Administered 2019-03-14 – 2019-03-15 (×2): 300 mg via ORAL
  Filled 2019-03-14 (×2): qty 1

## 2019-03-14 MED ORDER — ALBUTEROL SULFATE HFA 108 (90 BASE) MCG/ACT IN AERS: 2.0000 | INHALATION_SPRAY | RESPIRATORY_TRACT | Status: DC | PRN

## 2019-03-14 MED ORDER — ACETAMINOPHEN 160 MG/5ML PO SOLN: 650.0000 mg | ORAL | Status: DC | PRN

## 2019-03-14 MED ORDER — HYDRALAZINE HCL 50 MG PO TABS
50.0000 mg | ORAL_TABLET | Freq: Two times a day (BID) | ORAL | Status: DC
  Administered 2019-03-14 – 2019-03-15 (×2): 50 mg via ORAL
  Filled 2019-03-14 (×2): qty 1

## 2019-03-14 MED ORDER — STROKE: EARLY STAGES OF RECOVERY BOOK
Freq: Once | Status: AC
  Administered 2019-03-14: 22:00:00
  Filled 2019-03-14: qty 1

## 2019-03-14 MED ORDER — PREDNISONE 5 MG PO TABS
5.0000 mg | ORAL_TABLET | Freq: Every day | ORAL | Status: DC
  Administered 2019-03-14 – 2019-03-15 (×2): 5 mg via ORAL
  Filled 2019-03-14 (×2): qty 1

## 2019-03-14 NOTE — Progress Notes (Signed)
Patient arrived in the unit at 2040pm from ED escorted by ED staff, alert and confused, ambulated to the bed from stretcher, resting in a bed at this time, initiated tele monitor box 07, pt's spouse was presented in a bedside for few minutes, pt will be assessed and provided appr. Interventions/treatments, and monitor closely.

## 2019-03-14 NOTE — ED Provider Notes (Signed)
[facility] EMERGENCY DEPARTMENT Provider Note   CSN: [id number] Arrival date & time: 03/14/19  1105    History   Chief Complaint Chief Complaint  Patient presents with   Altered Mental Status    HPI LYNNE RIGHI is a 63 y.o. male.     63yo M w/ PMH including CKD, CVA, PE, sarcoidosis, pancreatitis, HTN, HLD who p/w AMS. Pt had revision lumbar fusion surgery on 6/24 which went well. He has been on pain meds and muscle relaxants since surgery. Yesterday morning, wife noticed that he seemed "out of sorts" and mildly confused.  At first, she thought that it may be related to combining his pain meds, muscle relaxants, and usual Ambien at night for sleep aid.  However, his confusion continued throughout the day.  They later noted that his blood pressure was elevated and called his PCP, who had him take his home blood pressure medications in the afternoon because he missed his morning dose.  She states he was up all night, restless and confused. He seemed to be using inappropriate words for things. She states he was not having these problems initially after surgery. She noted increased frequency of urination last night. He reported feeling short of breath but then clarified that this is only when he is forced to wear a mask in ED, not at rest. No chest pain or abd pain. He's had decreased appetite but no vomiting/diarrhea. No fevers or cough.  LEVEL 5 CAVEAT DUE TO AMS  The history is provided by the spouse and the patient. The history is limited by the condition of the patient.  Altered Mental Status   Past Medical History:  Diagnosis Date   Back pain    CKD (chronic kidney disease), stage III (HCC)    CVA (cerebral infarction)    Diastolic dysfunction    ED (erectile dysfunction)    GERD (gastroesophageal reflux disease)    Hypercalcemia    Hyperlipidemia    Hypertension    Insomnia    Long-term use of high-risk medication    Nephrocalcinosis     Nephrolithiasis    Obesity    Osteopenia    Pancreatitis    PE (pulmonary embolism)    Proteinuria    Sarcoidosis    Smoker     Patient Active Problem List   Diagnosis Date Noted   Acute gouty arthritis 03/07/2017   Arthralgia of left foot 03/07/2017   Nephrolithiasis    Fever, unspecified 11/02/2015   Essential hypertension 02/07/2015   Pulmonary embolism ([place]) 01/30/2013   Sarcoidosis 01/30/2013   Hemoptysis 01/30/2013   SHOULDER PAIN, LEFT 11/28/2009    Past Surgical History:  Procedure Laterality Date   ABDOMINAL EXPLORATION SURGERY     BACK SURGERY     CHOLECYSTECTOMY     SHOULDER SURGERY          Home Medications    Prior to Admission medications   Medication Sig Start Date End Date Taking? Authorizing Provider  albuterol (PROVENTIL HFA) 108 (90 Base) MCG/ACT inhaler Inhale 2 puffs into the lungs every 4 (four) hours as needed for wheezing or shortness of breath. Needs office visit for any refills. 01/05/17  Yes Shawnee Knapp, MD  aspirin 81 MG tablet Take 1 tablet (81 mg total) by mouth daily. 02/01/13  Yes [name], MD  atorvastatin (LIPITOR) 20 MG tablet Take 20 mg by mouth daily. 02/02/19  Yes [provider]  diltiazem (CARDIZEM CD) 360 MG 24 hr capsule  Take 360 mg by mouth daily.   Yes [provider]  fluticasone (FLONASE) 50 MCG/ACT nasal spray Place 2 sprays into both nostrils daily as needed for allergies or rhinitis.   Yes [provider]  hydrALAZINE (APRESOLINE) 50 MG tablet Take 50 mg by mouth 2 (two) times a day. 03/12/19  Yes [provider]  HYDROcodone-acetaminophen (NORCO) 10-325 MG tablet Take 1 tablet by mouth every 4 (four) hours as needed for pain. 03/01/19  Yes [provider]  labetalol (NORMODYNE) 300 MG tablet Take 300 mg by mouth 3 (three) times daily.   Yes [provider]  losartan (COZAAR) 100 MG tablet Take 100 mg by mouth daily. 01/05/19  Yes [provider]    MELATONIN PO Take 12.5 mg by mouth at bedtime.   Yes [provider]  methocarbamol (ROBAXIN) 500 MG tablet Take 500 mg by mouth 3 (three) times daily. 03/01/19  Yes [provider]  predniSONE (DELTASONE) 5 MG tablet Take 5 mg by mouth daily. Reported on 11/02/2015   Yes [provider]  tamsulosin (FLOMAX) 0.4 MG CAPS capsule Take 0.4 mg by mouth daily. 11/01/18  Yes [provider]  zolpidem (AMBIEN) 10 MG tablet Take 10 mg by mouth at bedtime.    Yes [provider]  promethazine (PHENERGAN) 25 MG tablet Take 1 tablet (25 mg total) by mouth every 6 (six) hours as needed for nausea. 09/24/11 08/19/18  Clayton Bibles, PA-C    Family History Family History  Problem Relation Age of Onset   Hypertension Father     Social History Social History   Tobacco Use   Smoking status: Former Smoker    Types: Cigarettes   Smokeless tobacco: Never Used  Substance Use Topics   Alcohol use: No    Alcohol/week: 0.0 standard drinks   Drug use: No     Allergies   Codeine and Hydromorphone   Review of Systems Review of Systems  Unable to perform ROS: Mental status change     Physical Exam Updated Vital Signs BP (!) 171/107    Pulse 73    Temp 98.4 F (36.9 C) (Oral)    Resp 17    SpO2 97%   Physical Exam Vitals signs and nursing note reviewed.  Constitutional:      General: He is not in acute distress.    Appearance: He is well-developed.     Comments: Awake, alert  HENT:     Head: Normocephalic and atraumatic.     Mouth/Throat:     Mouth: Mucous membranes are moist.  Eyes:     Extraocular Movements: Extraocular movements intact.     Conjunctiva/sclera: Conjunctivae normal.     Pupils: Pupils are equal, round, and reactive to light.  Neck:     Musculoskeletal: Neck supple.  Cardiovascular:     Rate and Rhythm: Normal rate and regular rhythm.     Heart sounds: Normal heart sounds. No murmur.  Pulmonary:     Effort: Pulmonary  effort is normal. No respiratory distress.     Breath sounds: Normal breath sounds.  Abdominal:     General: Bowel sounds are normal. There is no distension.     Palpations: Abdomen is soft.     Tenderness: There is no abdominal tenderness.  Skin:    General: Skin is warm and dry.     Comments: Incision site on lumbar spine clean/dry/intact  Neurological:     Mental Status: He is alert and oriented to  person, place, and time.     Cranial Nerves: No cranial nerve deficit.     Motor: No abnormal muscle tone.     Deep Tendon Reflexes: Reflexes are normal and symmetric.     Comments: Fluent speech, normal finger-to-nose testing, negative pronator drift, no clonus 5/5 strength and normal sensation x all 4 extremities  Psychiatric:     Comments: Quiet, flat affect      ED Treatments / Results  Labs (all labs ordered are listed, but only abnormal results are displayed) Labs Reviewed  COMPREHENSIVE METABOLIC PANEL - Abnormal; Notable for the following components:      Result Value   Glucose, Bld 120 (*)    BUN 7 (*)    Creatinine, Ser 1.48 (*)    Albumin 3.3 (*)    GFR calc non Af Amer 50 (*)    GFR calc Af Amer 58 (*)    All other components within normal limits  CBC - Abnormal; Notable for the following components:   WBC 11.2 (*)    Hemoglobin 12.6 (*)    HCT 37.4 (*)    All other components within normal limits  ACETAMINOPHEN LEVEL - Abnormal; Notable for the following components:   Acetaminophen (Tylenol), Serum <10 (*)    All other components within normal limits  RAPID URINE DRUG SCREEN, HOSP PERFORMED - Abnormal; Notable for the following components:   Opiates POSITIVE (*)    All other components within normal limits  URINALYSIS, ROUTINE W REFLEX MICROSCOPIC - Abnormal; Notable for the following components:   Protein, ur >=300 (*)    All other components within normal limits  CBG MONITORING, ED - Abnormal; Notable for the following components:   Glucose-Capillary 118  (*)    All other components within normal limits  SARS CORONAVIRUS 2 (HOSPITAL ORDER, [facility])  ETHANOL  SALICYLATE LEVEL    EKG None  Radiology Dg Chest 2 View  Result Date: 03/14/2019 CLINICAL DATA:  Altered mental status and recent back surgery. EXAM: CHEST - 2 VIEW COMPARISON:  07/30/2017 FINDINGS: Heart size is upper limits of normal but likely accentuated by the AP technique. Linear densities in the right lung could represent areas of atelectasis or scarring. No significant airspace disease. Negative for pulmonary edema. Probable calcification in the right paratracheal or right hilar region. No large pleural effusions. IMPRESSION: 1. No acute chest findings. 2. Few linear densities in the right lung are suggestive for scarring or mild atelectasis. Electronically Signed   By: Markus Daft M.D.   On: 03/14/2019 12:42   Ct Head Wo Contrast  Result Date: 03/14/2019 CLINICAL DATA:  63 year old male with altered mental status. EXAM: CT HEAD WITHOUT CONTRAST TECHNIQUE: Contiguous axial images were obtained from the base of the skull through the vertex without intravenous contrast. COMPARISON:  Brain MRI dated 08/02/2017 and head CT dated 11/16/2012 FINDINGS: Brain: There is mild age-related atrophy and moderate chronic microvascular ischemic changes. There is no acute intracranial hemorrhage. No mass effect or midline shift. No extra-axial fluid collection. Vascular: Focal area of high attenuating prominence in the left MCA within the sylvian fissure (series 3 image 25) appears similar or slightly prominent compared to the prior study. Although this may be artifactual a left MCA territory thrombus is not entirely excluded. Clinical correlation is recommended. If there is high clinical concern for acute left MCA thrombus/infarct further evaluation with MRI or CT angiography recommended. Skull: Normal. Negative for fracture or focal lesion. Sinuses/Orbits: Mild  diffuse  mucoperiosteal thickening of paranasal sinuses. No air-fluid level. The mastoid air cells are clear. Other: None IMPRESSION: 1. No acute intracranial hemorrhage. 2. Age-related atrophy and chronic microvascular ischemic changes. 3. Artifact versus less likely a left MCA thrombus. If there is high clinical concern for acute left MCA thrombus/infarct further evaluation with MRI or CT angiography recommended. Electronically Signed   By: Anner Crete M.D.   On: 03/14/2019 13:12    Procedures Procedures (including critical care time)  Medications Ordered in ED Medications  sodium chloride 0.9 % bolus 1,000 mL (has no administration in time range)  sodium chloride flush (NS) 0.9 % injection 3 mL (3 mLs Intravenous Given 03/14/19 1306)  iohexol (OMNIPAQUE) 350 MG/ML injection 100 mL (100 mLs Intravenous Contrast Given 03/14/19 1457)     Initial Impression / Assessment and Plan / ED Course  I have reviewed the triage vital signs and the nursing notes.  Pertinent labs & imaging results that were available during my care of the patient were reviewed by me and considered in my medical decision making (see chart for details).       No focal neurologic deficits on my exam, mildly elevated BP, remainder of vital signs normal.  Afebrile. DDx includes infection such as UTI or pneumonia, metabolic derangement, intracranial process.  Chest x-ray negative for infection.  Labs show WBC 11.2, creatinine 1.48, UA without infection. Head CT with ?L MCA thrombus. Discussed w/ neuro, Dr. Leonel Ramsay and Etta Quill. They have recommended CTA/P which I have ordered. Discussed admission for AMS with [facility], Dr. Esmond Plants.   Final Clinical Impressions(s) / ED Diagnoses   Final diagnoses:  Altered mental status, unspecified altered mental status type    ED Discharge Orders    None       Edu On, Wenda Overland, MD 03/14/19 1515

## 2019-03-14 NOTE — ED Notes (Signed)
Patient transported to CT 

## 2019-03-14 NOTE — ED Notes (Signed)
Patient transported to MRI 

## 2019-03-14 NOTE — ED Notes (Signed)
ED TO INPATIENT HANDOFF REPORT  ED Nurse Name and Phone #: Arby Barrette, RN  S Name/Age/Gender [name] 63 y.o. male Room/Bed: 014C/014C  Code Status   Code Status: Not on file  Home/SNF/Other Home Patient oriented to: self, place, time and situation Is this baseline? Yes   Triage Complete: Triage complete  Chief Complaint AMS  Triage Note Pt was bought in by wife who states he had a recent back surgery on 6/22. Per wife pt woke up yesterday morning and was shaving trouble with his memory and "seemed confused". Wife reports he has had several episodes of this since yesterday. Wife states she is concerned its due to his pain medication, muscle relaxants and Ambien he takes for sleep.  At this time the pt is alert and 0x4.    Allergies Allergies  Allergen Reactions  . Codeine Nausea Only  . Hydromorphone Other (See Comments)    hallucinations     Level of Care/Admitting Diagnosis ED Disposition    ED Disposition Condition Comment   Admit  Hospital Area: [facility] [[id number]]  Level of Care: Telemetry Medical [104]  Covid Evaluation: Asymptomatic Screening Protocol (No Symptoms)  Diagnosis: Stroke [facility]) [[id number]]  Admitting Physician: Vashti Hey [[id number]]  Attending Physician: Vashti Hey [[id number]]  Estimated length of stay: past midnight tomorrow  Certification:: I certify this patient will need inpatient services for at least 2 midnights  PT Class (Do Not Modify): Inpatient [101]  PT Acc Code (Do Not Modify): Private [1]       B Medical/Surgery History Past Medical History:  Diagnosis Date  . Back pain   . CKD (chronic kidney disease), stage III ([place])   . CVA (cerebral infarction)   . Diastolic dysfunction   . ED (erectile dysfunction)   . GERD (gastroesophageal reflux disease)   . Hypercalcemia   . Hyperlipidemia   . Hypertension   . Insomnia   . Long-term use of high-risk medication   . Nephrocalcinosis    . Nephrolithiasis   . Obesity   . Osteopenia   . Pancreatitis   . PE (pulmonary embolism)   . Proteinuria   . Sarcoidosis   . Smoker    Past Surgical History:  Procedure Laterality Date  . ABDOMINAL EXPLORATION SURGERY    . BACK SURGERY    . CHOLECYSTECTOMY    . SHOULDER SURGERY       A IV Location/Drains/Wounds Patient Lines/Drains/Airways Status   Active Line/Drains/Airways    Name:   Placement date:   Placement time:   Site:   Days:   Peripheral IV 03/14/19 Right Antecubital   03/14/19    1307    Antecubital   less than 1   Peripheral IV 03/14/19 Left Antecubital   03/14/19    1435    Antecubital   less than 1          Intake/Output Last 24 hours  Intake/Output Summary (Last 24 hours) at 03/14/2019 1904 Last data filed at 03/14/2019 1856 Gross per 24 hour  Intake 1000 ml  Output -  Net 1000 ml    Labs/Imaging Results for orders placed or performed during the hospital encounter of 03/14/19 (from the past 48 hour(s))  Comprehensive metabolic panel     Status: Abnormal   Collection Time: 03/14/19 11:24 AM  Result Value Ref Range   Sodium 139 135 - 145 mmol/L   Potassium 3.6 3.5 - 5.1 mmol/L   Chloride 100 98 - 111 mmol/L  CO2 29 22 - 32 mmol/L   Glucose, Bld 120 (H) 70 - 99 mg/dL   BUN 7 (L) 8 - 23 mg/dL   Creatinine, Ser 1.48 (H) 0.61 - 1.24 mg/dL   Calcium 9.4 8.9 - 10.3 mg/dL   Total Protein 6.9 6.5 - 8.1 g/dL   Albumin 3.3 (L) 3.5 - 5.0 g/dL   AST 27 15 - 41 U/L   ALT 27 0 - 44 U/L   Alkaline Phosphatase 88 38 - 126 U/L   Total Bilirubin 0.8 0.3 - 1.2 mg/dL   GFR calc non Af Amer 50 (L) >60 mL/min   GFR calc Af Amer 58 (L) >60 mL/min   Anion gap 10 5 - 15    Comment: Performed at [facility] [address] [place], [place] [address]  CBC     Status: Abnormal   Collection Time: 03/14/19 11:24 AM  Result Value Ref Range   WBC 11.2 (H) 4.0 - 10.5 K/uL   RBC 4.22 4.22 - 5.81 MIL/uL   Hemoglobin 12.6 (L) 13.0 - 17.0 g/dL   HCT 37.4 (L) 39.0 -  52.0 %   MCV 88.6 80.0 - 100.0 fL   MCH 29.9 26.0 - 34.0 pg   MCHC 33.7 30.0 - 36.0 g/dL   RDW 12.9 11.5 - 15.5 %   Platelets 370 150 - 400 K/uL   nRBC 0.0 0.0 - 0.2 %    Comment: Performed at [facility], [place] [address] [place], [place] [address]  CBG monitoring, ED     Status: Abnormal   Collection Time: 03/14/19 11:44 AM  Result Value Ref Range   Glucose-Capillary 118 (H) 70 - 99 mg/dL  Urine rapid drug screen (hosp performed)     Status: Abnormal   Collection Time: 03/14/19 12:56 PM  Result Value Ref Range   Opiates POSITIVE (A) NONE DETECTED   Cocaine NONE DETECTED NONE DETECTED   Benzodiazepines NONE DETECTED NONE DETECTED   Amphetamines NONE DETECTED NONE DETECTED   Tetrahydrocannabinol NONE DETECTED NONE DETECTED   Barbiturates NONE DETECTED NONE DETECTED    Comment: (NOTE) DRUG SCREEN FOR MEDICAL PURPOSES ONLY.  IF CONFIRMATION IS NEEDED FOR ANY PURPOSE, NOTIFY LAB WITHIN 5 DAYS. LOWEST DETECTABLE LIMITS FOR URINE DRUG SCREEN Drug Class                     Cutoff (ng/mL) Amphetamine and metabolites    1000 Barbiturate and metabolites    200 Benzodiazepine                 253 Tricyclics and metabolites     300 Opiates and metabolites        300 Cocaine and metabolites        300 THC                            50 Performed at [facility], [place] [address], [place], [place] [address]   Urinalysis, Routine w reflex microscopic     Status: Abnormal   Collection Time: 03/14/19 12:56 PM  Result Value Ref Range   Color, Urine YELLOW YELLOW   APPearance CLEAR CLEAR   Specific Gravity, Urine 1.011 1.005 - 1.030   pH 7.0 5.0 - 8.0   Glucose, UA NEGATIVE NEGATIVE mg/dL   Hgb urine dipstick NEGATIVE NEGATIVE   Bilirubin Urine NEGATIVE NEGATIVE   Ketones, ur NEGATIVE NEGATIVE mg/dL  Protein, ur >=300 (A) NEGATIVE mg/dL   Nitrite NEGATIVE NEGATIVE   Leukocytes,Ua NEGATIVE NEGATIVE   RBC / HPF 0-5 0 - 5 RBC/hpf   WBC, UA 0-5 0 - 5 WBC/hpf   Bacteria, UA  NONE SEEN NONE SEEN   Squamous Epithelial / LPF 0-5 0 - 5   Mucus PRESENT     Comment: Performed at [facility], [place] [address] [place], [place] [address]  Ethanol     Status: None   Collection Time: 03/14/19  1:08 PM  Result Value Ref Range   Alcohol, Ethyl (B) <10 <10 mg/dL    Comment: (NOTE) Lowest detectable limit for serum alcohol is 10 mg/dL. For medical purposes only. Performed at [facility], [place] [address] [place], [place] [address]   Acetaminophen level     Status: Abnormal   Collection Time: 03/14/19  1:08 PM  Result Value Ref Range   Acetaminophen (Tylenol), Serum <10 (L) 10 - 30 ug/mL    Comment: (NOTE) Therapeutic concentrations vary significantly. A range of 10-30 ug/mL  may be an effective concentration for many patients. However, some  are best treated at concentrations outside of this range. Acetaminophen concentrations >150 ug/mL at 4 hours after ingestion  and >50 ug/mL at 12 hours after ingestion are often associated with  toxic reactions. Performed at [facility], [place] [address], [place], [place] [address]   Salicylate level     Status: None   Collection Time: 03/14/19  1:08 PM  Result Value Ref Range   Salicylate Lvl <8.7 2.8 - 30.0 mg/dL    Comment: Performed at [facility] [address], [place], [place] [address]  SARS Coronavirus 2 (CEPHEID - Performed in [facility] hospital lab), Hosp Order     Status: None   Collection Time: 03/14/19  3:28 PM   Specimen: Nasopharyngeal Swab  Result Value Ref Range   SARS Coronavirus 2 NEGATIVE NEGATIVE    Comment: (NOTE) If result is NEGATIVE SARS-CoV-2 target nucleic acids are NOT DETECTED. The SARS-CoV-2 RNA is generally detectable in upper and lower  respiratory specimens during the acute phase of infection. The lowest  concentration of SARS-CoV-2 viral copies this assay can detect is 250  copies / mL. A negative result does not preclude SARS-CoV-2 infection  and should not  be used as the sole basis for treatment or other  patient management decisions.  A negative result may occur with  improper specimen collection / handling, submission of specimen other  than nasopharyngeal swab, presence of viral mutation(s) within the  areas targeted by this assay, and inadequate number of viral copies  (<250 copies / mL). A negative result must be combined with clinical  observations, patient history, and epidemiological information. If result is POSITIVE SARS-CoV-2 target nucleic acids are DETECTED. The SARS-CoV-2 RNA is generally detectable in upper and lower  respiratory specimens dur ing the acute phase of infection.  Positive  results are indicative of active infection with SARS-CoV-2.  Clinical  correlation with patient history and other diagnostic information is  necessary to determine patient infection status.  Positive results do  not rule out bacterial infection or co-infection with other viruses. If result is PRESUMPTIVE POSTIVE SARS-CoV-2 nucleic acids MAY BE PRESENT.   A presumptive positive result was obtained on the submitted specimen  and confirmed on repeat testing.  While 2019 novel coronavirus  (SARS-CoV-2) nucleic acids may be present in the submitted sample  additional confirmatory testing  may be necessary for epidemiological  and / or clinical management purposes  to differentiate between  SARS-CoV-2 and other Sarbecovirus currently known to infect humans.  If clinically indicated additional testing with an alternate test  methodology [phone]) is advised. The SARS-CoV-2 RNA is generally  detectable in upper and lower respiratory sp ecimens during the acute  phase of infection. The expected result is Negative. Fact Sheet for Patients:  StrictlyIdeas.no Fact Sheet for Healthcare Providers: BankingDealers.co.za This test is not yet approved or cleared by the [place] FDA and has been authorized  for detection and/or diagnosis of SARS-CoV-2 by FDA under an Emergency Use Authorization (EUA).  This EUA will remain in effect (meaning this test can be used) for the duration of the COVID-19 declaration under Section 564(b)(1) of the Act, 21 U.S.C. section 360bbb-3(b)(1), unless the authorization is terminated or revoked sooner. Performed at [facility], [place] [address], [place], [place] [address]   Vitamin B12     Status: None   Collection Time: 03/14/19  3:28 PM  Result Value Ref Range   Vitamin B-12 671 180 - 914 pg/mL    Comment: (NOTE) This assay is not validated for testing neonatal or myeloproliferative syndrome specimens for Vitamin B12 levels. Performed at [facility], [place] [address], [place], [place] [address]   TSH     Status: None   Collection Time: 03/14/19  3:45 PM  Result Value Ref Range   TSH 1.259 0.350 - 4.500 uIU/mL    Comment: Performed by a 3rd Generation assay with a functional sensitivity of <=0.01 uIU/mL. Performed at [facility], [place] [address], [place], [place] [address]   Folate     Status: None   Collection Time: 03/14/19  3:45 PM  Result Value Ref Range   Folate 12.4 >5.9 ng/mL    Comment: Performed at [facility], [place] [address] [place], [place] [address]  Ammonia     Status: None   Collection Time: 03/14/19  3:45 PM  Result Value Ref Range   Ammonia 22 9 - 35 umol/L    Comment: Performed at [facility], [place] [address], [place], [name] [address]   Ct Angio Head W Or Wo Contrast  Result Date: 03/14/2019 CLINICAL DATA:  63 year old male with altered mental status, confusion. EXAM: CT ANGIOGRAPHY HEAD AND NECK CT PERFUSION BRAIN TECHNIQUE: Multidetector CT imaging of the head and neck was performed using the standard protocol during bolus administration of intravenous contrast. Multiplanar CT image reconstructions and MIPs were obtained to evaluate the vascular anatomy. Carotid stenosis measurements (when  applicable) are obtained utilizing NASCET criteria, using the distal internal carotid diameter as the denominator. Multiphase CT imaging of the brain was performed following IV bolus contrast injection. Subsequent parametric perfusion maps were calculated using RAPID software. CONTRAST:  167mL OMNIPAQUE IOHEXOL 350 MG/ML SOLN COMPARISON:  Head CT without contrast earlier today. FINDINGS: CT Brain Perfusion Findings: ASPECTS: 10, at 1241 hours today. CBF (<30%) Volume: None Perfusion (Tmax>6.0s) volume: 8mL, although primarily at the bilateral vertex and appears likely to be artifact. Mismatch Volume: 43mL Infarction Location:Not applicable CTA NECK Skeleton: Absent and carious dentition. Degenerative changes in the cervical spine. No acute osseous abnormality identified. Upper chest: Negative lung apices. Small calcified postinflammatory appearing lymph nodes in the visible mediastinum. Other neck: Negative. Aortic arch: Ectatic aortic arch. Three vessel configuration. No arch atherosclerosis. Right carotid system: Negative aside from arterial tortuosity. Left carotid system: Negative aside  from arterial tortuosity, including a partially retropharyngeal course of the left carotid. Vertebral arteries: Normal proximal right subclavian artery and right vertebral artery origin. Mildly tortuous right vertebral artery is patent to the skull base without stenosis. Normal proximal left subclavian artery and left vertebral artery origin. The left vertebral artery is tortuous in the V2 segment, especially the C3-C4 neural foramen, but patent to the skull base without stenosis. CTA HEAD Posterior circulation: Codominant distal vertebral arteries without stenosis. Normal bilateral PICA origins, vertebrobasilar junction. Patent basilar artery without stenosis. Normal SCA and PCA origins. Normal right posterior communicating artery, the left is diminutive or absent. Bilateral PCA branches are within normal limits. Anterior  circulation: Patent ICA siphons. On the left there is mild ectasia and minimal calcified plaque without stenosis. Normal left ophthalmic artery origin. On the right there is mild ectasia and calcified plaque without stenosis. Normal right ophthalmic and posterior communicating artery origins. Patent carotid termini, MCA and ACA origins. The left A1 is dominant and the right is diminutive. The left ACA remains dominant, with an azygos type ACA appearance. ACA branches are within normal limits. Left MCA M1 segment and bifurcation are patent without stenosis. Left MCA branches are normal aside from mild tortuosity. Right MCA M1 segment and bifurcation are patent without stenosis. Right MCA branches are normal aside from mild tortuosity. Venous sinuses: Patent. Anatomic variants: Azygos type ACA anatomy with dominant left A1 and ACA. Review of the MIP images confirms the above findings IMPRESSION: 1. Negative for large vessel occlusion. And CTA is negative for age aside from generalized arterial tortuosity. 2. No core infarct detected by CT Perfusion. And suggestion of small volume penumbra at the cerebral vertex is likely artifact, perhaps related to azygos ACA anatomy (normal variant). Electronically Signed   By: Genevie Ann M.D.   On: 03/14/2019 15:19   Dg Chest 2 View  Result Date: 03/14/2019 CLINICAL DATA:  Altered mental status and recent back surgery. EXAM: CHEST - 2 VIEW COMPARISON:  07/30/2017 FINDINGS: Heart size is upper limits of normal but likely accentuated by the AP technique. Linear densities in the right lung could represent areas of atelectasis or scarring. No significant airspace disease. Negative for pulmonary edema. Probable calcification in the right paratracheal or right hilar region. No large pleural effusions. IMPRESSION: 1. No acute chest findings. 2. Few linear densities in the right lung are suggestive for scarring or mild atelectasis. Electronically Signed   By: Markus Daft M.D.   On:  03/14/2019 12:42   Ct Head Wo Contrast  Result Date: 03/14/2019 CLINICAL DATA:  63 year old male with altered mental status. EXAM: CT HEAD WITHOUT CONTRAST TECHNIQUE: Contiguous axial images were obtained from the base of the skull through the vertex without intravenous contrast. COMPARISON:  Brain MRI dated 08/02/2017 and head CT dated 11/16/2012 FINDINGS: Brain: There is mild age-related atrophy and moderate chronic microvascular ischemic changes. There is no acute intracranial hemorrhage. No mass effect or midline shift. No extra-axial fluid collection. Vascular: Focal area of high attenuating prominence in the left MCA within the sylvian fissure (series 3 image 25) appears similar or slightly prominent compared to the prior study. Although this may be artifactual a left MCA territory thrombus is not entirely excluded. Clinical correlation is recommended. If there is high clinical concern for acute left MCA thrombus/infarct further evaluation with MRI or CT angiography recommended. Skull: Normal. Negative for fracture or focal lesion. Sinuses/Orbits: Mild diffuse mucoperiosteal thickening of paranasal sinuses. No air-fluid level. The mastoid air cells  are clear. Other: None IMPRESSION: 1. No acute intracranial hemorrhage. 2. Age-related atrophy and chronic microvascular ischemic changes. 3. Artifact versus less likely a left MCA thrombus. If there is high clinical concern for acute left MCA thrombus/infarct further evaluation with MRI or CT angiography recommended. Electronically Signed   By: Anner Crete M.D.   On: 03/14/2019 13:12   Ct Angio Neck W And/or Wo Contrast  Result Date: 03/14/2019 CLINICAL DATA:  63 year old male with altered mental status, confusion. EXAM: CT ANGIOGRAPHY HEAD AND NECK CT PERFUSION BRAIN TECHNIQUE: Multidetector CT imaging of the head and neck was performed using the standard protocol during bolus administration of intravenous contrast. Multiplanar CT image reconstructions  and MIPs were obtained to evaluate the vascular anatomy. Carotid stenosis measurements (when applicable) are obtained utilizing NASCET criteria, using the distal internal carotid diameter as the denominator. Multiphase CT imaging of the brain was performed following IV bolus contrast injection. Subsequent parametric perfusion maps were calculated using RAPID software. CONTRAST:  179mL OMNIPAQUE IOHEXOL 350 MG/ML SOLN COMPARISON:  Head CT without contrast earlier today. FINDINGS: CT Brain Perfusion Findings: ASPECTS: 10, at 1241 hours today. CBF (<30%) Volume: None Perfusion (Tmax>6.0s) volume: 60mL, although primarily at the bilateral vertex and appears likely to be artifact. Mismatch Volume: 24mL Infarction Location:Not applicable CTA NECK Skeleton: Absent and carious dentition. Degenerative changes in the cervical spine. No acute osseous abnormality identified. Upper chest: Negative lung apices. Small calcified postinflammatory appearing lymph nodes in the visible mediastinum. Other neck: Negative. Aortic arch: Ectatic aortic arch. Three vessel configuration. No arch atherosclerosis. Right carotid system: Negative aside from arterial tortuosity. Left carotid system: Negative aside from arterial tortuosity, including a partially retropharyngeal course of the left carotid. Vertebral arteries: Normal proximal right subclavian artery and right vertebral artery origin. Mildly tortuous right vertebral artery is patent to the skull base without stenosis. Normal proximal left subclavian artery and left vertebral artery origin. The left vertebral artery is tortuous in the V2 segment, especially the C3-C4 neural foramen, but patent to the skull base without stenosis. CTA HEAD Posterior circulation: Codominant distal vertebral arteries without stenosis. Normal bilateral PICA origins, vertebrobasilar junction. Patent basilar artery without stenosis. Normal SCA and PCA origins. Normal right posterior communicating artery, the  left is diminutive or absent. Bilateral PCA branches are within normal limits. Anterior circulation: Patent ICA siphons. On the left there is mild ectasia and minimal calcified plaque without stenosis. Normal left ophthalmic artery origin. On the right there is mild ectasia and calcified plaque without stenosis. Normal right ophthalmic and posterior communicating artery origins. Patent carotid termini, MCA and ACA origins. The left A1 is dominant and the right is diminutive. The left ACA remains dominant, with an azygos type ACA appearance. ACA branches are within normal limits. Left MCA M1 segment and bifurcation are patent without stenosis. Left MCA branches are normal aside from mild tortuosity. Right MCA M1 segment and bifurcation are patent without stenosis. Right MCA branches are normal aside from mild tortuosity. Venous sinuses: Patent. Anatomic variants: Azygos type ACA anatomy with dominant left A1 and ACA. Review of the MIP images confirms the above findings IMPRESSION: 1. Negative for large vessel occlusion. And CTA is negative for age aside from generalized arterial tortuosity. 2. No core infarct detected by CT Perfusion. And suggestion of small volume penumbra at the cerebral vertex is likely artifact, perhaps related to azygos ACA anatomy (normal variant). Electronically Signed   By: Genevie Ann M.D.   On: 03/14/2019 15:19   Ct Cerebral  Perfusion W Contrast  Result Date: 03/14/2019 CLINICAL DATA:  63 year old male with altered mental status, confusion. EXAM: CT ANGIOGRAPHY HEAD AND NECK CT PERFUSION BRAIN TECHNIQUE: Multidetector CT imaging of the head and neck was performed using the standard protocol during bolus administration of intravenous contrast. Multiplanar CT image reconstructions and MIPs were obtained to evaluate the vascular anatomy. Carotid stenosis measurements (when applicable) are obtained utilizing NASCET criteria, using the distal internal carotid diameter as the denominator.  Multiphase CT imaging of the brain was performed following IV bolus contrast injection. Subsequent parametric perfusion maps were calculated using RAPID software. CONTRAST:  141mL OMNIPAQUE IOHEXOL 350 MG/ML SOLN COMPARISON:  Head CT without contrast earlier today. FINDINGS: CT Brain Perfusion Findings: ASPECTS: 10, at 1241 hours today. CBF (<30%) Volume: None Perfusion (Tmax>6.0s) volume: 24mL, although primarily at the bilateral vertex and appears likely to be artifact. Mismatch Volume: 44mL Infarction Location:Not applicable CTA NECK Skeleton: Absent and carious dentition. Degenerative changes in the cervical spine. No acute osseous abnormality identified. Upper chest: Negative lung apices. Small calcified postinflammatory appearing lymph nodes in the visible mediastinum. Other neck: Negative. Aortic arch: Ectatic aortic arch. Three vessel configuration. No arch atherosclerosis. Right carotid system: Negative aside from arterial tortuosity. Left carotid system: Negative aside from arterial tortuosity, including a partially retropharyngeal course of the left carotid. Vertebral arteries: Normal proximal right subclavian artery and right vertebral artery origin. Mildly tortuous right vertebral artery is patent to the skull base without stenosis. Normal proximal left subclavian artery and left vertebral artery origin. The left vertebral artery is tortuous in the V2 segment, especially the C3-C4 neural foramen, but patent to the skull base without stenosis. CTA HEAD Posterior circulation: Codominant distal vertebral arteries without stenosis. Normal bilateral PICA origins, vertebrobasilar junction. Patent basilar artery without stenosis. Normal SCA and PCA origins. Normal right posterior communicating artery, the left is diminutive or absent. Bilateral PCA branches are within normal limits. Anterior circulation: Patent ICA siphons. On the left there is mild ectasia and minimal calcified plaque without stenosis. Normal  left ophthalmic artery origin. On the right there is mild ectasia and calcified plaque without stenosis. Normal right ophthalmic and posterior communicating artery origins. Patent carotid termini, MCA and ACA origins. The left A1 is dominant and the right is diminutive. The left ACA remains dominant, with an azygos type ACA appearance. ACA branches are within normal limits. Left MCA M1 segment and bifurcation are patent without stenosis. Left MCA branches are normal aside from mild tortuosity. Right MCA M1 segment and bifurcation are patent without stenosis. Right MCA branches are normal aside from mild tortuosity. Venous sinuses: Patent. Anatomic variants: Azygos type ACA anatomy with dominant left A1 and ACA. Review of the MIP images confirms the above findings IMPRESSION: 1. Negative for large vessel occlusion. And CTA is negative for age aside from generalized arterial tortuosity. 2. No core infarct detected by CT Perfusion. And suggestion of small volume penumbra at the cerebral vertex is likely artifact, perhaps related to azygos ACA anatomy (normal variant). Electronically Signed   By: Genevie Ann M.D.   On: 03/14/2019 15:19    Pending Labs Unresulted Labs (From admission, onward)    Start     Ordered   03/14/19 1752  Vitamin B1  Once,   STAT     03/14/19 1751   03/14/19 1528  RPR  Once,   STAT    Question:  Specimen collection method  Answer:  IV Team   03/14/19 1528   Signed and Held  HIV antibody (Routine Testing)  Once,   R     Signed and Held   Signed and Held  Hemoglobin A1c  Tomorrow morning,   R     Signed and Held   Signed and Held  Lipid panel  Tomorrow morning,   R    Comments: Fasting    Signed and Held   Signed and Held  CBC  (enoxaparin (LOVENOX)    CrCl >/= 30 ml/min)  Once,   R    Comments: Baseline for enoxaparin therapy IF NOT ALREADY DRAWN.  Notify MD if PLT < 100 K.    Signed and Held   Signed and Held  Creatinine, serum  (enoxaparin (LOVENOX)    CrCl >/= 30 ml/min)   Once,   R    Comments: Baseline for enoxaparin therapy IF NOT ALREADY DRAWN.    Signed and Held   Signed and Held  Creatinine, serum  (enoxaparin (LOVENOX)    CrCl >/= 30 ml/min)  Weekly,   R    Comments: while on enoxaparin therapy    Signed and Held          Vitals/Pain Today's Vitals   03/14/19 1415 03/14/19 1430 03/14/19 1745 03/14/19 1857  BP:      Pulse: 73 73 81   Resp: 19 17 17    Temp:      TempSrc:      SpO2: 96% 97% 95%   PainSc:    0-No pain    Isolation Precautions No active isolations  Medications Medications  QUEtiapine (SEROQUEL) tablet 25 mg (has no administration in time range)  sodium chloride flush (NS) 0.9 % injection 3 mL (3 mLs Intravenous Given 03/14/19 1306)  sodium chloride 0.9 % bolus 1,000 mL (0 mLs Intravenous Stopped 03/14/19 1856)  iohexol (OMNIPAQUE) 350 MG/ML injection 100 mL (100 mLs Intravenous Contrast Given 03/14/19 1457)    Mobility walks Moderate fall risk   Focused Assessments Neuro Assessment Handoff:  Swallow screen pass? No    NIH Stroke Scale ( + Modified Stroke Scale Criteria)  Interval: Initial Level of Consciousness (1a.)   : Alert, keenly responsive LOC Questions (1b. )   +: Answers one question correctly LOC Commands (1c. )   + : Performs both tasks correctly Best Gaze (2. )  +: Normal Visual (3. )  +: No visual loss Facial Palsy (4. )    : Normal symmetrical movements Motor Arm, Left (5a. )   +: No drift Motor Arm, Right (5b. )   +: No drift Motor Leg, Left (6a. )   +: No drift Motor Leg, Right (6b. )   +: No drift Limb Ataxia (7. ): Absent Sensory (8. )   +: Mild-to-moderate sensory loss, patient feels pinprick is less sharp or is dull on the affected side, or there is a loss of superficial pain with pinprick, but patient is aware of being touched Best Language (9. )   +: No aphasia Dysarthria (10. ): Normal Extinction/Inattention (11.)   +: No Abnormality Modified SS Total  +: 2 Complete NIHSS TOTAL: 2 Last  date known well: 03/13/19 Last time known well: 0600 Neuro Assessment:   Neuro Checks:   Initial (03/14/19 1200)  Last Documented NIHSS Modified Score: 2 (03/14/19 1300) Has TPA been given? No If patient is a Neuro Trauma and patient is going to OR before floor call report to [facility] nurse: [phone] or [phone]     R Recommendations: See Admitting Provider Note  Report given to: 3W   Additional Notes:

## 2019-03-14 NOTE — Progress Notes (Signed)
EEG complete - results pending 

## 2019-03-14 NOTE — Procedures (Signed)
History: 63 year old male being evaluated for confusion  Sedation: None  Technique: This is a 21 channel routine scalp EEG performed at the bedside with bipolar and monopolar montages arranged in accordance to the international 10/20 system of electrode placement. One channel was dedicated to EKG recording.    Background: There is a posterior dominant rhythm of 9 Hz.  In addition, there is diffuse admixed delta and theta range activities intruding into the background.  Drowsiness and sleep were not recorded.  There was no epileptiform discharge seen.  Photic stimulation: Physiologic driving is not performed  EEG Abnormalities: 1) generalized irregular slow activity  Clinical Interpretation: This EEG is consistent with a mild generalized nonspecific cerebral dysfunction. There was no seizure or seizure predisposition recorded on this study. Please note that lack of epileptiform activity on EEG does not preclude the possibility of epilepsy.   Roland Rack, MD [facility] [phone]  If 7pm- 7am, please page neurology on call as listed in [place].

## 2019-03-14 NOTE — Consult Note (Addendum)
Neurology Consultation  Reason for Consult: Confusion Referring Physician: Dr. Rex Kras  CC: Confusion  History is obtained from: Wife  HPI: [name] is a 63 y.o. male with history of smoking, sarcoidosis, pulmonary embolism, pancreatitis, insomnia, hypertension, hyperlipidemia, hypercalcemia, CVA in the past, and chronic kidney disease stage III.  Patient did have a surgery approximately 2 weeks ago for L3-4 laminectomy.  Patient's wife does state that over the last 3 to 4 months she has noticed that he has had a decline in memory and possibly this started about a year ago.  She states that he has been misplacing his keys but nothing large to it she does not drive, able to clean and take care of himself, he does not cook, she takes care of the bills.  She states however that yesterday morning when he woke up he was having difficulty finding words.  Difficulty recalling events and did not realize that he lives in [place].  Since yesterday morning this has not improved which is why she brought him to the emergency department as this was a fairly drastic change.  The night prior she says he would not sleep as he felt extremely anxious and paranoid.  She did give him an Ambien along with a melatonin.  However this did not help him sleep.  He is on narcotics but has been on narcotics for the last 2 weeks and has had no problems with this medication.  Per wife she states that he likely does drink but not significant amount.  Last significant change from normal was on 03/13/2019 in the morning Patient did not receive TPA as he was out of the window NIH stroke scale of 0  ED course: CT of head, CTA of head and neck, CT perfusion and labs.   Chart review :5 years ago patient was brought to the hospital for confusion left-sided weakness.  MRI did not show any evidence of infarct at that time.   ROS: A 14 point ROS was performed and is negative except as noted in the HPI.   Past Medical History:   Diagnosis Date  . Back pain   . CKD (chronic kidney disease), stage III ([place])   . CVA (cerebral infarction)   . Diastolic dysfunction   . ED (erectile dysfunction)   . GERD (gastroesophageal reflux disease)   . Hypercalcemia   . Hyperlipidemia   . Hypertension   . Insomnia   . Long-term use of high-risk medication   . Nephrocalcinosis   . Nephrolithiasis   . Obesity   . Osteopenia   . Pancreatitis   . PE (pulmonary embolism)   . Proteinuria   . Sarcoidosis   . Smoker     Family History  Problem Relation Age of Onset  . Hypertension Father    Social History:   reports that he has quit smoking. His smoking use included cigarettes. He has never used smokeless tobacco. He reports that he does not drink alcohol or use drugs.  Medications  Current Facility-Administered Medications:  .  sodium chloride 0.9 % bolus 1,000 mL, 1,000 mL, Intravenous, Once, Little, Wenda Overland, MD  Current Outpatient Medications:  .  albuterol (PROVENTIL HFA) 108 (90 Base) MCG/ACT inhaler, Inhale 2 puffs into the lungs every 4 (four) hours as needed for wheezing or shortness of breath. Needs office visit for any refills., Disp: 1 Inhaler, Rfl: 0 .  aspirin 81 MG tablet, Take 1 tablet (81 mg total) by mouth daily., Disp: , Rfl:  .  atorvastatin (LIPITOR) 20 MG tablet, Take 20 mg by mouth daily., Disp: , Rfl:  .  diltiazem (CARDIZEM CD) 360 MG 24 hr capsule, Take 360 mg by mouth daily., Disp: , Rfl:  .  fluticasone (FLONASE) 50 MCG/ACT nasal spray, Place 2 sprays into both nostrils daily as needed for allergies or rhinitis., Disp: , Rfl:  .  hydrALAZINE (APRESOLINE) 50 MG tablet, Take 50 mg by mouth 2 (two) times a day., Disp: , Rfl:  .  HYDROcodone-acetaminophen (NORCO) 10-325 MG tablet, Take 1 tablet by mouth every 4 (four) hours as needed for pain., Disp: , Rfl:  .  labetalol (NORMODYNE) 300 MG tablet, Take 300 mg by mouth 3 (three) times daily., Disp: , Rfl:  .  losartan (COZAAR) 100 MG tablet,  Take 100 mg by mouth daily., Disp: , Rfl:  .  MELATONIN PO, Take 12.5 mg by mouth at bedtime., Disp: , Rfl:  .  methocarbamol (ROBAXIN) 500 MG tablet, Take 500 mg by mouth 3 (three) times daily., Disp: , Rfl:  .  predniSONE (DELTASONE) 5 MG tablet, Take 5 mg by mouth daily. Reported on 11/02/2015, Disp: , Rfl:  .  tamsulosin (FLOMAX) 0.4 MG CAPS capsule, Take 0.4 mg by mouth daily., Disp: , Rfl:  .  zolpidem (AMBIEN) 10 MG tablet, Take 10 mg by mouth at bedtime. , Disp: , Rfl:  .  promethazine (PHENERGAN) 25 MG tablet, Take 1 tablet (25 mg total) by mouth every 6 (six) hours as needed for nausea., Disp: 20 tablet, Rfl: 0   Exam: Current vital signs: BP (!) 171/107   Pulse 73   Temp 98.4 F (36.9 C) (Oral)   Resp 17   SpO2 97%  Vital signs in last 24 hours: Temp:  [98.4 F (36.9 C)] 98.4 F (36.9 C) (07/08 1109) Pulse Rate:  [73-79] 73 (07/08 1430) Resp:  [17-19] 17 (07/08 1430) BP: (148-171)/(88-107) 171/107 (07/08 1400) SpO2:  [94 %-98 %] 97 % (07/08 1430)  Physical Exam  Constitutional: Appears well-developed and well-nourished.  Psych: Affect appropriate to situation Eyes: No scleral injection HENT: No OP obstrucion Head: Normocephalic.  Cardiovascular: Normal rate and regular rhythm.  Respiratory: Effort normal, non-labored breathing GI: Soft.  No distension. There is no tenderness.  Skin: WDI  Neuro: Mental Status: Patient is awake, alert, oriented to person, hospital however not month or year Unable to give a clear history and all history is obtained from the wife No aphasia Cranial Nerves: II: Visual Fields are full.  III,IV, VI: EOMI without ptosis or diploplia. Pupils equal, round and reactive to light V: Facial sensation is symmetric to temperature VII: Facial movement is symmetric.  However it is noticed that the forehead on the left does not ankle VIII: hearing is intact to voice X: Palat elevates symmetrically XI: Shoulder shrug is symmetric. XII: tongue  is midline without atrophy or fasciculations.  Motor: Tone is normal. Bulk is normal. 5/5 strength was present in all four extremities.  Sensory: Sensation is symmetric to light touch and temperature in the arms and legs. Deep Tendon Reflexes: 2+ and symmetric in the biceps with no ankle jerk or knee jerk Plantars: Toes are downgoing bilaterally.  Cerebellar: FNF and HKS are intact bilaterally  Labs I have reviewed labs in epic and the results pertinent to this consultation are:   CBC    Component Value Date/Time   WBC 11.2 (H) 03/14/2019 1124   RBC 4.22 03/14/2019 1124   HGB 12.6 (L) 03/14/2019 1124  HCT 37.4 (L) 03/14/2019 1124   PLT 370 03/14/2019 1124   MCV 88.6 03/14/2019 1124   MCV 89.4 11/02/2015 1003   MCH 29.9 03/14/2019 1124   MCHC 33.7 03/14/2019 1124   RDW 12.9 03/14/2019 1124   LYMPHSABS 0.9 04/27/2009 0947   MONOABS 0.4 04/27/2009 0947   EOSABS 0.7 04/27/2009 0947   BASOSABS 0.0 04/27/2009 0947    CMP     Component Value Date/Time   NA 139 03/14/2019 1124   K 3.6 03/14/2019 1124   CL 100 03/14/2019 1124   CO2 29 03/14/2019 1124   GLUCOSE 120 (H) 03/14/2019 1124   BUN 7 (L) 03/14/2019 1124   CREATININE 1.48 (H) 03/14/2019 1124   CREATININE 1.58 (H) 02/07/2015 0920   CALCIUM 9.4 03/14/2019 1124   CALCIUM 11.9 Result repeated and verified. (H) 02/08/2007 0350   PROT 6.9 03/14/2019 1124   ALBUMIN 3.3 (L) 03/14/2019 1124   AST 27 03/14/2019 1124   ALT 27 03/14/2019 1124   ALKPHOS 88 03/14/2019 1124   BILITOT 0.8 03/14/2019 1124   GFRNONAA 50 (L) 03/14/2019 1124   GFRAA 58 (L) 03/14/2019 1124    Lipid Panel     Component Value Date/Time   CHOL 209 (H) 02/07/2015 0920   TRIG 280 (H) 02/07/2015 0920   HDL 41 02/07/2015 0920   CHOLHDL 5.1 02/07/2015 0920   VLDL 56 (H) 02/07/2015 0920   LDLCALC 112 (H) 02/07/2015 0920     Imaging I have reviewed the images obtained:  CT-scan of the brain--  IMPRESSION: 1. No acute intracranial  hemorrhage. 2. Age-related atrophy and chronic microvascular ischemic changes. 3. Artifact versus less likely a left MCA thrombus. If there is high clinical concern for acute left MCA thrombus/infarct further evaluation with MRI or CT angiography recommended.  CTA of head and neck and perfusion below IMPRESSION: 1. Negative for large vessel occlusion. And CTA is negative for age aside from generalized arterial tortuosity. 2. No core infarct detected by CT Perfusion. And suggestion of small volume penumbra at the cerebral vertex is likely artifact, perhaps related to azygos ACA anatomy (normal variant).   Etta Quill PA-C [facility] [phone]  M-F  (9:00 am- 5:00 PM)  03/14/2019, 3:07 PM   I have seen the patient and reviewed the above note.  He has difficulty with simple addition, world backwards is [id number].  When asked to subtract 7 from 100, he gives a 63.  Assessment:  63 year old male with progressive decline in memory at baseline who presents with acute worsening in the setting of recent surgery, multiple pain medications usage, lack of sleep.  My hope is that this represents a mild delirium due to recent pain/medication use/lack of sleep.  We will pursue further work-up as outlined below.  I also would favor a low-dose neuroleptic tonight to help the patient get some sleep.  Recommendations: -CTA of head and neck along with perfusion - MRI of head - EEG - TSH, B1, B12, RPR, ammonia -Seroquel 25 mg tonight.  Roland Rack, MD [facility] [phone]  If 7pm- 7am, please page neurology on call as listed in [place].

## 2019-03-14 NOTE — H&P (Signed)
History and Physical:    [name]   TDV:[id number] DOB: 1956-08-16 DOA: 03/14/2019  Referring MD/provider: Dr Rex Kras  PCP: [name], MD   Patient coming from: Home  Chief Complaint: Confusion for 36 hours.  History is primarily per patient's wife.  Patient able to participate in history but he does admit to not remembering everything and refers [place] back to his wife.  History of Present Illness:   [name] is an 63 y.o. 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  Patient apparently did well postop with high doses of narcotic pain medications, Ambien, melatonin as well as intermittent use of methocarbamol.  Approximately 36 hours ago however patient wife notes that he was acting not quite himself.  Apparently he was somewhat restless walking around at nighttime and becoming increasingly agitated.  She also thinks he may have had some difficulty communicating but she was not so sure about that, he apparently was definitely having some confusion without realizing exactly where he lived.  Patient's wife initially thought this was secondary to patient taking so many medications however she does admit that he had done well on that medication regimen for 2 weeks until 36 hours ago.  She thinks he may have taken methocarbamol for the first time the night before he became confused but she is not sure.  Of note patient was also noted to be quite hypertensive yesterday although patient's wife thought that might of been because he had not taken his medications.  She gave him his blood pressure medications with some improvement in blood pressure.  Patient's wife became concerned when he did not improved and brought him to the ED.  She does not think that he has had any fevers or  chills or sweats.  He has not had any vomiting or diarrhea.  Does not appear to be short of breath and has had no cough.  Patient's wife does feel that he has improved over the course of the day compared to how he was last night.  Patient himself states that he feels okay.  He is not sure if he is confused or not.  He was surprised to be told he may have had a stroke.  He does know he is in the hospital.  He does not really remember much of yesterday.  He does remember coming to the hospital with his wife today.  Patient denies abdominal pain, chest pain, headache.  Notes back pain is no different from baseline.  ED Course: In the ED patient was noted to be somewhat somnolent and confused.  Patient underwent a CT which possibly showed an MCA thrombus on the left.  Neurology was consulted who recommended admission for further stroke work-up.  ROS:   ROS   Review of Systems: Per HPI, complete review of systems could not be done due to mental status.  Past Medical History:   Past Medical History:  Diagnosis Date   Back pain    CKD (chronic kidney disease), stage III (HCC)    CVA (cerebral infarction)    Diastolic dysfunction    ED (erectile dysfunction)    GERD (gastroesophageal reflux disease)    Hypercalcemia    Hyperlipidemia    Hypertension    Insomnia  Long-term use of high-risk medication    Nephrocalcinosis    Nephrolithiasis    Obesity    Osteopenia    Pancreatitis    PE (pulmonary embolism)    Proteinuria    Sarcoidosis    Smoker     Past Surgical History:   Past Surgical History:  Procedure Laterality Date   ABDOMINAL EXPLORATION SURGERY     BACK SURGERY     CHOLECYSTECTOMY     SHOULDER SURGERY      Social History:   Social History   Socioeconomic History   Marital status: Married    Spouse name: Not on file   Number of children: Not on file   Years of education: Not on file   Highest education level: Not on file    Occupational History   Not on file  Social Needs   Financial resource strain: Not on file   Food insecurity    Worry: Not on file    Inability: Not on file   Transportation needs    Medical: Not on file    Non-medical: Not on file  Tobacco Use   Smoking status: Former Smoker    Types: Cigarettes   Smokeless tobacco: Never Used  Substance and Sexual Activity   Alcohol use: No    Alcohol/week: 0.0 standard drinks   Drug use: No   Sexual activity: Yes  Lifestyle   Physical activity    Days per week: Not on file    Minutes per session: Not on file   Stress: Not on file  Relationships   Social connections    Talks on phone: Not on file    Gets together: Not on file    Attends religious service: Not on file    Active member of club or organization: Not on file    Attends meetings of clubs or organizations: Not on file    Relationship status: Not on file   Intimate partner violence    Fear of current or ex partner: Not on file    Emotionally abused: Not on file    Physically abused: Not on file    Forced sexual activity: Not on file  Other Topics Concern   Not on file  Social History Narrative   Not on file    Allergies   Codeine and Hydromorphone  Family history:   Family History  Problem Relation Age of Onset   Hypertension Father     Current Medications:   Prior to Admission medications   Medication Sig Start Date End Date Taking? Authorizing Provider  albuterol (PROVENTIL HFA) 108 (90 Base) MCG/ACT inhaler Inhale 2 puffs into the lungs every 4 (four) hours as needed for wheezing or shortness of breath. Needs office visit for any refills. 01/05/17  Yes Shawnee Knapp, MD  aspirin 81 MG tablet Take 1 tablet (81 mg total) by mouth daily. 02/01/13  Yes [name], MD  atorvastatin (LIPITOR) 20 MG tablet Take 20 mg by mouth daily. 02/02/19  Yes [provider]  diltiazem (CARDIZEM CD) 360 MG 24 hr capsule Take 360 mg by mouth daily.   Yes  [provider]  fluticasone (FLONASE) 50 MCG/ACT nasal spray Place 2 sprays into both nostrils daily as needed for allergies or rhinitis.   Yes [provider]  hydrALAZINE (APRESOLINE) 50 MG tablet Take 50 mg by mouth 2 (two) times a day. 03/12/19  Yes [provider]  HYDROcodone-acetaminophen (NORCO) 10-325 MG tablet Take 1 tablet by mouth every  4 (four) hours as needed for pain. 03/01/19  Yes [provider]  labetalol (NORMODYNE) 300 MG tablet Take 300 mg by mouth 3 (three) times daily.   Yes [provider]  losartan (COZAAR) 100 MG tablet Take 100 mg by mouth daily. 01/05/19  Yes [provider]  MELATONIN PO Take 12.5 mg by mouth at bedtime.   Yes [provider]  methocarbamol (ROBAXIN) 500 MG tablet Take 500 mg by mouth 3 (three) times daily. 03/01/19  Yes [provider]  predniSONE (DELTASONE) 5 MG tablet Take 5 mg by mouth daily. Reported on 11/02/2015   Yes [provider]  tamsulosin (FLOMAX) 0.4 MG CAPS capsule Take 0.4 mg by mouth daily. 11/01/18  Yes [provider]  zolpidem (AMBIEN) 10 MG tablet Take 10 mg by mouth at bedtime.    Yes [provider]  promethazine (PHENERGAN) 25 MG tablet Take 1 tablet (25 mg total) by mouth every 6 (six) hours as needed for nausea. 09/24/11 08/19/18  Clayton Bibles, PA-C    Physical Exam:   Vitals:   03/14/19 1345 03/14/19 1400 03/14/19 1415 03/14/19 1430  BP:  (!) 171/107    Pulse: 79 78 73 73  Resp: 19 17 19 17   Temp:      TempSrc:      SpO2: 98% 97% 96% 97%     Physical Exam: Blood pressure (!) 171/107, pulse 73, temperature 98.4 F (36.9 C), temperature source Oral, resp. rate 17, SpO2 97 %. Gen: Obese man lying flat in bed getting EEG placed on head in no acute respiratory distress.  Attentive wife at bedside. Eyes: Sclerae anicteric. Conjunctiva mildly injected. Chest: Decreased air entry bilaterally possibly secondary to very large chest  wall.  No adventitious sounds. CV: Distant, regular, no audible murmurs. Abdomen: Obese, NABS, soft, nondistended, nontender. Extremities: No edema.  Skin: Warm and dry. No rashes, lesions or wounds. Neuro: Patient is alert.  He is oriented to person and place but not to time.  Prehension is intact.  No dysarthria.  There may be slight perseveration.  No clear aphasia.  Moving all extremities without difficulty.  Able to cross midline.   Data Review:    Labs: Basic Metabolic Panel: Recent Labs  Lab 03/14/19 1124  NA 139  K 3.6  CL 100  CO2 29  GLUCOSE 120*  BUN 7*  CREATININE 1.48*  CALCIUM 9.4   Liver Function Tests: Recent Labs  Lab 03/14/19 1124  AST 27  ALT 27  ALKPHOS 88  BILITOT 0.8  PROT 6.9  ALBUMIN 3.3*   No results for input(s): LIPASE, AMYLASE in the last 168 hours. Recent Labs  Lab 03/14/19 1545  AMMONIA 22   CBC: Recent Labs  Lab 03/14/19 1124  WBC 11.2*  HGB 12.6*  HCT 37.4*  MCV 88.6  PLT 370   Cardiac Enzymes: No results for input(s): CKTOTAL, CKMB, CKMBINDEX, TROPONINI in the last 168 hours.  BNP (last 3 results) No results for input(s): PROBNP in the last 8760 hours. CBG: Recent Labs  Lab 03/14/19 1144  GLUCAP 118*    Urinalysis    Component Value Date/Time   COLORURINE YELLOW 03/14/2019 [place] 03/14/2019 1256   LABSPEC 1.011 03/14/2019 1256   PHURINE 7.0 03/14/2019 1256   GLUCOSEU NEGATIVE 03/14/2019 1256   HGBUR NEGATIVE 03/14/2019 1256   BILIRUBINUR NEGATIVE 03/14/2019 1256   [place] 03/14/2019 1256   PROTEINUR >=300 (A) 03/14/2019 1256   UROBILINOGEN 0.2 11/16/2012  1004   NITRITE NEGATIVE 03/14/2019 [place] 03/14/2019 1256      Radiographic Studies: Ct Angio Head W Or Wo Contrast  Result Date: 03/14/2019 CLINICAL DATA:  63 year old male with altered mental status, confusion. EXAM: CT ANGIOGRAPHY HEAD AND NECK CT PERFUSION BRAIN TECHNIQUE: Multidetector CT  imaging of the head and neck was performed using the standard protocol during bolus administration of intravenous contrast. Multiplanar CT image reconstructions and MIPs were obtained to evaluate the vascular anatomy. Carotid stenosis measurements (when applicable) are obtained utilizing NASCET criteria, using the distal internal carotid diameter as the denominator. Multiphase CT imaging of the brain was performed following IV bolus contrast injection. Subsequent parametric perfusion maps were calculated using RAPID software. CONTRAST:  123mL OMNIPAQUE IOHEXOL 350 MG/ML SOLN COMPARISON:  Head CT without contrast earlier today. FINDINGS: CT Brain Perfusion Findings: ASPECTS: 10, at 1241 hours today. CBF (<30%) Volume: None Perfusion (Tmax>6.0s) volume: 50mL, although primarily at the bilateral vertex and appears likely to be artifact. Mismatch Volume: 47mL Infarction Location:Not applicable CTA NECK Skeleton: Absent and carious dentition. Degenerative changes in the cervical spine. No acute osseous abnormality identified. Upper chest: Negative lung apices. Small calcified postinflammatory appearing lymph nodes in the visible mediastinum. Other neck: Negative. Aortic arch: Ectatic aortic arch. Three vessel configuration. No arch atherosclerosis. Right carotid system: Negative aside from arterial tortuosity. Left carotid system: Negative aside from arterial tortuosity, including a partially retropharyngeal course of the left carotid. Vertebral arteries: Normal proximal right subclavian artery and right vertebral artery origin. Mildly tortuous right vertebral artery is patent to the skull base without stenosis. Normal proximal left subclavian artery and left vertebral artery origin. The left vertebral artery is tortuous in the V2 segment, especially the C3-C4 neural foramen, but patent to the skull base without stenosis. CTA HEAD Posterior circulation: Codominant distal vertebral arteries without stenosis. Normal  bilateral PICA origins, vertebrobasilar junction. Patent basilar artery without stenosis. Normal SCA and PCA origins. Normal right posterior communicating artery, the left is diminutive or absent. Bilateral PCA branches are within normal limits. Anterior circulation: Patent ICA siphons. On the left there is mild ectasia and minimal calcified plaque without stenosis. Normal left ophthalmic artery origin. On the right there is mild ectasia and calcified plaque without stenosis. Normal right ophthalmic and posterior communicating artery origins. Patent carotid termini, MCA and ACA origins. The left A1 is dominant and the right is diminutive. The left ACA remains dominant, with an azygos type ACA appearance. ACA branches are within normal limits. Left MCA M1 segment and bifurcation are patent without stenosis. Left MCA branches are normal aside from mild tortuosity. Right MCA M1 segment and bifurcation are patent without stenosis. Right MCA branches are normal aside from mild tortuosity. Venous sinuses: Patent. Anatomic variants: Azygos type ACA anatomy with dominant left A1 and ACA. Review of the MIP images confirms the above findings IMPRESSION: 1. Negative for large vessel occlusion. And CTA is negative for age aside from generalized arterial tortuosity. 2. No core infarct detected by CT Perfusion. And suggestion of small volume penumbra at the cerebral vertex is likely artifact, perhaps related to azygos ACA anatomy (normal variant). Electronically Signed   By: Genevie Ann M.D.   On: 03/14/2019 15:19   Dg Chest 2 View  Result Date: 03/14/2019 CLINICAL DATA:  Altered mental status and recent back surgery. EXAM: CHEST - 2 VIEW COMPARISON:  07/30/2017 FINDINGS: Heart size is upper limits of normal but likely accentuated by the AP technique. Linear densities in  the right lung could represent areas of atelectasis or scarring. No significant airspace disease. Negative for pulmonary edema. Probable calcification in the  right paratracheal or right hilar region. No large pleural effusions. IMPRESSION: 1. No acute chest findings. 2. Few linear densities in the right lung are suggestive for scarring or mild atelectasis. Electronically Signed   By: Markus Daft M.D.   On: 03/14/2019 12:42   Ct Head Wo Contrast  Result Date: 03/14/2019 CLINICAL DATA:  63 year old male with altered mental status. EXAM: CT HEAD WITHOUT CONTRAST TECHNIQUE: Contiguous axial images were obtained from the base of the skull through the vertex without intravenous contrast. COMPARISON:  Brain MRI dated 08/02/2017 and head CT dated 11/16/2012 FINDINGS: Brain: There is mild age-related atrophy and moderate chronic microvascular ischemic changes. There is no acute intracranial hemorrhage. No mass effect or midline shift. No extra-axial fluid collection. Vascular: Focal area of high attenuating prominence in the left MCA within the sylvian fissure (series 3 image 25) appears similar or slightly prominent compared to the prior study. Although this may be artifactual a left MCA territory thrombus is not entirely excluded. Clinical correlation is recommended. If there is high clinical concern for acute left MCA thrombus/infarct further evaluation with MRI or CT angiography recommended. Skull: Normal. Negative for fracture or focal lesion. Sinuses/Orbits: Mild diffuse mucoperiosteal thickening of paranasal sinuses. No air-fluid level. The mastoid air cells are clear. Other: None IMPRESSION: 1. No acute intracranial hemorrhage. 2. Age-related atrophy and chronic microvascular ischemic changes. 3. Artifact versus less likely a left MCA thrombus. If there is high clinical concern for acute left MCA thrombus/infarct further evaluation with MRI or CT angiography recommended. Electronically Signed   By: Anner Crete M.D.   On: 03/14/2019 13:12   Ct Angio Neck W And/or Wo Contrast  Result Date: 03/14/2019 CLINICAL DATA:  63 year old male with altered mental status,  confusion. EXAM: CT ANGIOGRAPHY HEAD AND NECK CT PERFUSION BRAIN TECHNIQUE: Multidetector CT imaging of the head and neck was performed using the standard protocol during bolus administration of intravenous contrast. Multiplanar CT image reconstructions and MIPs were obtained to evaluate the vascular anatomy. Carotid stenosis measurements (when applicable) are obtained utilizing NASCET criteria, using the distal internal carotid diameter as the denominator. Multiphase CT imaging of the brain was performed following IV bolus contrast injection. Subsequent parametric perfusion maps were calculated using RAPID software. CONTRAST:  165mL OMNIPAQUE IOHEXOL 350 MG/ML SOLN COMPARISON:  Head CT without contrast earlier today. FINDINGS: CT Brain Perfusion Findings: ASPECTS: 10, at 1241 hours today. CBF (<30%) Volume: None Perfusion (Tmax>6.0s) volume: 31mL, although primarily at the bilateral vertex and appears likely to be artifact. Mismatch Volume: 57mL Infarction Location:Not applicable CTA NECK Skeleton: Absent and carious dentition. Degenerative changes in the cervical spine. No acute osseous abnormality identified. Upper chest: Negative lung apices. Small calcified postinflammatory appearing lymph nodes in the visible mediastinum. Other neck: Negative. Aortic arch: Ectatic aortic arch. Three vessel configuration. No arch atherosclerosis. Right carotid system: Negative aside from arterial tortuosity. Left carotid system: Negative aside from arterial tortuosity, including a partially retropharyngeal course of the left carotid. Vertebral arteries: Normal proximal right subclavian artery and right vertebral artery origin. Mildly tortuous right vertebral artery is patent to the skull base without stenosis. Normal proximal left subclavian artery and left vertebral artery origin. The left vertebral artery is tortuous in the V2 segment, especially the C3-C4 neural foramen, but patent to the skull base without stenosis. CTA  HEAD Posterior circulation: Codominant distal vertebral arteries without  stenosis. Normal bilateral PICA origins, vertebrobasilar junction. Patent basilar artery without stenosis. Normal SCA and PCA origins. Normal right posterior communicating artery, the left is diminutive or absent. Bilateral PCA branches are within normal limits. Anterior circulation: Patent ICA siphons. On the left there is mild ectasia and minimal calcified plaque without stenosis. Normal left ophthalmic artery origin. On the right there is mild ectasia and calcified plaque without stenosis. Normal right ophthalmic and posterior communicating artery origins. Patent carotid termini, MCA and ACA origins. The left A1 is dominant and the right is diminutive. The left ACA remains dominant, with an azygos type ACA appearance. ACA branches are within normal limits. Left MCA M1 segment and bifurcation are patent without stenosis. Left MCA branches are normal aside from mild tortuosity. Right MCA M1 segment and bifurcation are patent without stenosis. Right MCA branches are normal aside from mild tortuosity. Venous sinuses: Patent. Anatomic variants: Azygos type ACA anatomy with dominant left A1 and ACA. Review of the MIP images confirms the above findings IMPRESSION: 1. Negative for large vessel occlusion. And CTA is negative for age aside from generalized arterial tortuosity. 2. No core infarct detected by CT Perfusion. And suggestion of small volume penumbra at the cerebral vertex is likely artifact, perhaps related to azygos ACA anatomy (normal variant). Electronically Signed   By: Genevie Ann M.D.   On: 03/14/2019 15:19   Ct Cerebral Perfusion W Contrast  Result Date: 03/14/2019 CLINICAL DATA:  63 year old male with altered mental status, confusion. EXAM: CT ANGIOGRAPHY HEAD AND NECK CT PERFUSION BRAIN TECHNIQUE: Multidetector CT imaging of the head and neck was performed using the standard protocol during bolus administration of intravenous  contrast. Multiplanar CT image reconstructions and MIPs were obtained to evaluate the vascular anatomy. Carotid stenosis measurements (when applicable) are obtained utilizing NASCET criteria, using the distal internal carotid diameter as the denominator. Multiphase CT imaging of the brain was performed following IV bolus contrast injection. Subsequent parametric perfusion maps were calculated using RAPID software. CONTRAST:  151mL OMNIPAQUE IOHEXOL 350 MG/ML SOLN COMPARISON:  Head CT without contrast earlier today. FINDINGS: CT Brain Perfusion Findings: ASPECTS: 10, at 1241 hours today. CBF (<30%) Volume: None Perfusion (Tmax>6.0s) volume: 65mL, although primarily at the bilateral vertex and appears likely to be artifact. Mismatch Volume: 47mL Infarction Location:Not applicable CTA NECK Skeleton: Absent and carious dentition. Degenerative changes in the cervical spine. No acute osseous abnormality identified. Upper chest: Negative lung apices. Small calcified postinflammatory appearing lymph nodes in the visible mediastinum. Other neck: Negative. Aortic arch: Ectatic aortic arch. Three vessel configuration. No arch atherosclerosis. Right carotid system: Negative aside from arterial tortuosity. Left carotid system: Negative aside from arterial tortuosity, including a partially retropharyngeal course of the left carotid. Vertebral arteries: Normal proximal right subclavian artery and right vertebral artery origin. Mildly tortuous right vertebral artery is patent to the skull base without stenosis. Normal proximal left subclavian artery and left vertebral artery origin. The left vertebral artery is tortuous in the V2 segment, especially the C3-C4 neural foramen, but patent to the skull base without stenosis. CTA HEAD Posterior circulation: Codominant distal vertebral arteries without stenosis. Normal bilateral PICA origins, vertebrobasilar junction. Patent basilar artery without stenosis. Normal SCA and PCA origins.  Normal right posterior communicating artery, the left is diminutive or absent. Bilateral PCA branches are within normal limits. Anterior circulation: Patent ICA siphons. On the left there is mild ectasia and minimal calcified plaque without stenosis. Normal left ophthalmic artery origin. On the right there is mild ectasia and  calcified plaque without stenosis. Normal right ophthalmic and posterior communicating artery origins. Patent carotid termini, MCA and ACA origins. The left A1 is dominant and the right is diminutive. The left ACA remains dominant, with an azygos type ACA appearance. ACA branches are within normal limits. Left MCA M1 segment and bifurcation are patent without stenosis. Left MCA branches are normal aside from mild tortuosity. Right MCA M1 segment and bifurcation are patent without stenosis. Right MCA branches are normal aside from mild tortuosity. Venous sinuses: Patent. Anatomic variants: Azygos type ACA anatomy with dominant left A1 and ACA. Review of the MIP images confirms the above findings IMPRESSION: 1. Negative for large vessel occlusion. And CTA is negative for age aside from generalized arterial tortuosity. 2. No core infarct detected by CT Perfusion. And suggestion of small volume penumbra at the cerebral vertex is likely artifact, perhaps related to azygos ACA anatomy (normal variant). Electronically Signed   By: Genevie Ann M.D.   On: 03/14/2019 15:19    EKG: Ordered and pending   Assessment/Plan:   Active Problems:   Sarcoidosis   Essential hypertension   Stroke (HCC)   CKD (chronic kidney disease), stage III (HCC)   Diastolic dysfunction  63 year old man with history of hypertension, cerebrovascular disease status post strokes in the past, sarcoidosis and recent L3-4 laminectomy 2 weeks ago presents with 36 hours of confusion and altered mental status.  Work-up reveals possible MCA thrombosis.  Patient is now being admitted for work-up of possible stroke.  Neurology is  consulting actively.   ALTERED MENTAL STATUS Patient with subacute confusion for the past 36 hours. Diagnosis includes possible stroke versus polypharmacy versus seizure per neurology Per patient's wife he is apparently improving with time. Laboratory data are unrevealing in terms of electrolyte abnormalities No evidence of any infection in urine or chest noted, patient is afebrile  POSSIBLE MCA STROKE Patient with known history of strokes in the past Continue aspirin and atorvastatin Will allow for some level of permissive hypertension  HTN We will allow for some level of permissive hypertension. Continue hydralazine and labetalol Hold diltiazem and losartan for now These can be restarted after 24 hours  SARCOIDOSIS Continue prednisone 5 mg daily per home doses No need for stress dose steroids at present  BPH Continue Flomax  H/O VTE History of PE in the past.  Patient does not seem to be on a anticoagulant as an outpatient. Lovenox ordered.     Other information:   DVT prophylaxis: Lovenox ordered. Code Status: Full code. Family Communication: Patient's wife was at bedside throughout Disposition Plan: Home Consults called: Neurology Admission status: Inpatient  The medical decision making on this patient was of high complexity and the patient is at high risk for clinical deterioration, therefore this is a level 3 visit.   Dewaine Oats Tublu Jessen Siegman [facility]  If 7PM-7AM, please contact night-coverage [email or web address] Password [email or web address] 03/14/2019, 5:59 PM

## 2019-03-14 NOTE — ED Triage Notes (Signed)
Pt was bought in by wife who states he had a recent back surgery on 6/22. Per wife pt woke up yesterday morning and was shaving trouble with his memory and "seemed confused". Wife reports he has had several episodes of this since yesterday. Wife states she is concerned its due to his pain medication, muscle relaxants and Ambien he takes for sleep.  At this time the pt is alert and 0x4.

## 2019-03-14 NOTE — Plan of Care (Signed)
  Problem: Education: Goal: Knowledge of disease or condition will improve Outcome: Progressing Goal: Knowledge of secondary prevention will improve Outcome: Progressing Goal: Knowledge of patient specific risk factors addressed and post discharge goals established will improve Outcome: Progressing   

## 2019-03-15 ENCOUNTER — Inpatient Hospital Stay (HOSPITAL_COMMUNITY): Payer: BC Managed Care – PPO

## 2019-03-15 DIAGNOSIS — D869 Sarcoidosis, unspecified: Secondary | ICD-10-CM

## 2019-03-15 DIAGNOSIS — R410 Disorientation, unspecified: Principal | ICD-10-CM

## 2019-03-15 DIAGNOSIS — I5189 Other ill-defined heart diseases: Secondary | ICD-10-CM

## 2019-03-15 DIAGNOSIS — I639 Cerebral infarction, unspecified: Secondary | ICD-10-CM

## 2019-03-15 DIAGNOSIS — N183 Chronic kidney disease, stage 3 (moderate): Secondary | ICD-10-CM

## 2019-03-15 DIAGNOSIS — I10 Essential (primary) hypertension: Secondary | ICD-10-CM

## 2019-03-15 LAB — LIPID PANEL
Cholesterol: 123 mg/dL (ref 0–200)
HDL: 32 mg/dL — ABNORMAL LOW (ref >40)
LDL Cholesterol: 65 mg/dL (ref 0–99)
Total CHOL/HDL Ratio: 3.8 RATIO
Triglycerides: 130 mg/dL (ref <150)
VLDL: 26 mg/dL (ref 0–40)

## 2019-03-15 LAB — ECHOCARDIOGRAM COMPLETE
Height: 72 in
Weight: 4091.74 oz

## 2019-03-15 LAB — HIV ANTIBODY (ROUTINE TESTING W REFLEX): HIV Screen 4th Generation wRfx: NONREACTIVE

## 2019-03-15 LAB — RPR: RPR Ser Ql: NONREACTIVE

## 2019-03-15 MED ORDER — HYDROCODONE-ACETAMINOPHEN 10-325 MG PO TABS: 0.5000 | ORAL_TABLET | Freq: Four times a day (QID) | ORAL | 0 refills | Status: DC | PRN

## 2019-03-15 MED ORDER — QUETIAPINE FUMARATE 25 MG PO TABS: 25.0000 mg | ORAL_TABLET | Freq: Every day | ORAL | 0 refills | Status: DC

## 2019-03-15 NOTE — Evaluation (Signed)
Occupational Therapy Evaluation Patient Details Name: [name] MRN: [id number] DOB: 1956/03/17 Today's Date: 03/15/2019    History of Present Illness 63 y.o. 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  Patient apparently did well postop with high doses of narcotic pain medications, Ambien, melatonin as well as intermittent use of methocarbamol.  Approximately 36 hours ago however patient wife notes that he was acting not quite himself.  Apparently he was somewhat restless walking around at nighttime and becoming increasingly agitated.  She also thinks he may have had some difficulty communicating but she was not so sure about that, he apparently was definitely having some confusion without realizing exactly where he lived.  Patient's wife initially thought this was secondary to patient taking so many medications however she does admit that he had done well on that medication regimen for 2 weeks until 36 hours ago.  She thinks he may have taken methocarbamol for the first time the night before he became confused but she is not sure.  Of note patient was also noted to be quite hypertensive yesterday although patient's wife thought that might of been because he had not taken his medications.  She gave him his blood pressure medications with some improvement in blood pressure.  Patient's wife became concerned when he did not improved and brought him to the ED.   Clinical Impression   Patient presenting with decreased I in self care, balance, functional mobility/transfers, balance, and cognition.  Patient reports being independent PTA pt likely poor historian secondary to confusion and no family present in room. Patient currently functioning at min guard - min A without use of AD. Patient  will benefit from acute OT to increase overall independence in the areas of ADLs, functional mobility, and safety awareness in order to safely discharge home with family.    Follow Up Recommendations  Home health OT;Supervision/Assistance - 24 hour    Equipment Recommendations  None recommended by OT    Recommendations for Other Services (none at this time)     Precautions / Restrictions Precautions Precautions: Fall;Back Required Braces or Orthoses: Spinal Brace Spinal Brace: Lumbar corset;Applied in sitting position Restrictions Other Position/Activity Restrictions: no back precautions/brace order present but recent back surgery and brace in room. Pt unable to confirm if he has precautions or use of brace      Mobility Bed Mobility Overal bed mobility: Needs Assistance Bed Mobility: Rolling;Supine to Sit;Sit to Supine Rolling: Supervision   Supine to sit: Min guard Sit to supine: Min guard   General bed mobility comments: min guard for flat bed without use of bed rail.  Transfers   Equipment used: None Transfers: Sit to/from [employer] to Stand: Min guard Stand pivot transfers: Min guard       General transfer comment: steady assist with balance. Pt reaching out for therapist hand and given cuing to attempt on his own. He reports owning RW but does not use    Balance Overall balance assessment: Needs assistance Sitting-balance support: Feet supported Sitting balance-Leahy Scale: Good     Standing balance support: During functional activity Standing balance-Leahy Scale: Fair Standing balance comment: min guard        ADL either performed or assessed with clinical judgement   ADL Overall ADL's :  Needs assistance/impaired Eating/Feeding: Independent   Grooming: Supervision/safety;Standing;Wash/dry hands;Wash/dry face;Oral care   Upper Body Bathing: Set up;Sitting   Lower Body Bathing: Minimal assistance;Sit to/from stand   Upper  Body Dressing : Set up;Sitting   Lower Body Dressing: Minimal assistance;Sit to/from stand   Toilet Transfer: Min guard;Stand-pivot;Grab bars   Toileting- Water quality scientist and Hygiene: Min guard         General ADL Comments: min guard with higher level balance tasks without use of AE     Vision Baseline Vision/History: Wears glasses Wears Glasses: At all times Patient Visual Report: No change from baseline Vision Assessment?: No apparent visual deficits            Pertinent Vitals/Pain Pain Assessment: Faces Faces Pain Scale: Hurts little more Pain Location: back Pain Descriptors / Indicators: Aching;Discomfort Pain Intervention(s): Limited activity within patient's tolerance;Monitored during session;Premedicated before session;Repositioned     Hand Dominance Right   Extremity/Trunk Assessment Upper Extremity Assessment Upper Extremity Assessment: Overall WFL for tasks assessed           Communication Communication Communication: No difficulties   Cognition Arousal/Alertness: Awake/alert Behavior During Therapy: WFL for tasks assessed/performed Overall Cognitive Status: Impaired/Different from baseline Area of Impairment: Memory;Orientation;Safety/judgement;Awareness;Problem solving        Orientation Level: Disoriented to;Time. Pt verbalized it being "April"   Memory: Decreased recall of precautions   Safety/Judgement: Decreased awareness of safety;Decreased awareness of deficits Awareness: Emergent Problem Solving: Slow processing;Requires verbal cues General Comments: min cuing for safety awareness and back precautions. Pt unable to tell therapist room number and needing mod verbal guidance cuing to locate room in hallway.              Home Living Family/patient expects to be discharged to:: Private residence Living Arrangements: Spouse/significant other;Children Available Help at Discharge: Family;Available 24 hours/day Type of Home: House              Bathroom Shower/Tub: Teacher, early years/pre: Standard Bathroom Accessibility: Yes   Home Equipment: Environmental consultant - 2 wheels;Shower seat   Additional Comments: Pt appearing confused this session and likely poor historian. No family present to confirm home situation      Prior Functioning/Environment Level of Independence: Independent        Comments: per pt report        OT Problem List: Decreased strength;Decreased coordination;Pain;Decreased cognition;Decreased activity tolerance;Decreased safety awareness;Impaired balance (sitting and/or standing);Decreased knowledge of use of DME or AE;Decreased knowledge of precautions      OT Treatment/Interventions: Self-care/ADL training;Therapeutic exercise;Therapeutic activities;Cognitive remediation/compensation;Energy conservation;DME and/or AE instruction;Patient/family education;Manual therapy;Modalities    OT Goals(Current goals can be found in the care plan section) Acute Rehab OT Goals Patient Stated Goal: to go home OT Goal Formulation: With patient Time For Goal Achievement: 03/29/19 Potential to Achieve Goals: Good ADL Goals Pt Will Perform Upper Body Bathing: with supervision Pt Will Perform Lower Body Bathing: with supervision Pt Will Perform Upper Body Dressing: with supervision Pt Will Perform Lower Body Dressing: with supervision Pt Will Transfer to Toilet: with supervision Pt Will Perform Toileting - Clothing Manipulation and hygiene: with supervision Pt Will Perform Tub/Shower Transfer: with supervision  OT Frequency: Min 2X/week   Barriers to D/C:    none known at this time          AM-PAC OT "6 Clicks" Daily Activity     Outcome Measure Help from another person eating meals?: None Help from another person taking care of personal grooming?: A Little Help  from another person toileting, which includes using toliet, bedpan, or urinal?: A Little Help from another person bathing (including  washing, rinsing, drying)?: A Little Help from another person to put on and taking off regular upper body clothing?: A Little Help from another person to put on and taking off regular lower body clothing?: A Little 6 Click Score: 19   End of Session    Activity Tolerance: Patient tolerated treatment well Patient left: in bed;with call bell/phone within reach;with bed alarm set  OT Visit Diagnosis: Unsteadiness on feet (R26.81);Other symptoms and signs involving cognitive function                Time: 1040-4591 OT Time Calculation (min): 17 min Charges:  OT General Charges $OT Visit: 1 Visit OT Evaluation $OT Eval Low Complexity: 1 Low  Zahava Quant P, MS, OTR/L 03/15/2019, 11:00 AM

## 2019-03-15 NOTE — Evaluation (Signed)
Speech Language Pathology Evaluation Patient Details Name: [name] MRN: [id number] DOB: 09/25/1955 Today's Date: 03/15/2019 Time: 2841-3244 SLP Time Calculation (min) (ACUTE ONLY): 20 min  Problem List:  Patient Active Problem List   Diagnosis Date Noted  . Stroke ([place]) 03/14/2019  . CKD (chronic kidney disease), stage III ([place])   . CVA (cerebral infarction)   . Diastolic dysfunction   . Acute gouty arthritis 03/07/2017  . Arthralgia of left foot 03/07/2017  . Nephrolithiasis   . Fever, unspecified 11/02/2015  . Essential hypertension 02/07/2015  . Pulmonary embolism ([place]) 01/30/2013  . Sarcoidosis 01/30/2013  . Hemoptysis 01/30/2013  . SHOULDER PAIN, LEFT 11/28/2009   Past Medical History:  Past Medical History:  Diagnosis Date  . Back pain   . CKD (chronic kidney disease), stage III ([place])   . CVA (cerebral infarction)   . Diastolic dysfunction   . ED (erectile dysfunction)   . GERD (gastroesophageal reflux disease)   . Hypercalcemia   . Hyperlipidemia   . Hypertension   . Insomnia   . Long-term use of high-risk medication   . Nephrocalcinosis   . Nephrolithiasis   . Obesity   . Osteopenia   . Pancreatitis   . PE (pulmonary embolism)   . Proteinuria   . Sarcoidosis   . Smoker    Past Surgical History:  Past Surgical History:  Procedure Laterality Date  . ABDOMINAL EXPLORATION SURGERY    . BACK SURGERY    . CHOLECYSTECTOMY    . SHOULDER SURGERY     HPI:  Patient is a 63 y.o. male with PMH: HTN, CVA, stage III CKD, sarcoidosis, VTE/PE, who was in his usual state of health until two weeks ago when he underwent an elective laminectomy at L3-4. He apparently did well pos-op, but approximately 36 hours prior to hospital admission, his wife noticed he was not acting like he usual does, pacing around at nighttime, becoming agitated, having confusion. He became hypertensive and when he did not exhibit improvements, his wife brought him to ED. CT Head showed MCA  thrombus on the left. MRI was limited because of patient only allowing for 5 minutes of study, but no acute infarct was obseved.   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 Patient is to be discharging home today and throughout this evaluation, he was perseverative on wondering where his wife was to pick him up and go home. He was easily redirected but could not maintain attention to functional tasks or conversation for even short periods of time. When presented with test item from [facility] to draw a clock, he looked at instructions, read them out loud a few times but never even started to draw it. 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 Currently, patient is not safe to be unsupervised and would benefit from skilled SLP intervention. As he is discharging home today, recommend [facility] SLP.    SLP Assessment  SLP Recommendation/Assessment: All further Speech Lanaguage Pathology  needs can be addressed in the next venue of care SLP Visit Diagnosis: Cognitive communication  deficit (R41.841)    Follow Up Recommendations  Home health SLP    Frequency and Duration     N/A      SLP Evaluation Cognition  Overall Cognitive Status: Impaired/Different from baseline Arousal/Alertness: Awake/alert Orientation Level: Oriented to person;Oriented to place;Oriented to time;Disoriented to situation Attention: Sustained Sustained Attention: Impaired Sustained Attention Impairment: Verbal basic;Functional basic Memory: Impaired Memory Impairment: Retrieval  deficit;Decreased recall of new information Awareness: Impaired Awareness Impairment: Intellectual impairment Problem Solving: Impaired Problem Solving Impairment: Functional basic;Functional complex;Verbal complex Executive Function: Initiating;Organizing Organizing: Impaired Organizing Impairment: Verbal complex;Functional basic;Functional complex Initiating: Impaired Initiating Impairment: Functional basic;Functional complex Self Correcting: Impaired Self Correcting Impairment: Functional basic;Functional complex Behaviors: Restless;Perseveration;Lability Safety/Judgment: Impaired       Comprehension  Auditory Comprehension Overall Auditory Comprehension: Appears within functional limits for tasks assessed    Expression Expression Primary Mode of Expression: Verbal Verbal Expression Overall Verbal Expression: Impaired Initiation: No impairment Level of Generative/Spontaneous Verbalization: Sentence;Conversation Repetition: No impairment Naming: No impairment Pragmatics: Impairment Impairments: Topic maintenance Effective Techniques: Open ended questions;Semantic cues Non-Verbal Means of Communication: Not applicable Written Expression Dominant Hand: Right Written Expression: Not tested   Oral / Motor  Oral Motor/Sensory Function Overall Oral Motor/Sensory Function: Within functional limits   GO                    Dannial Monarch 03/15/2019, 2:31 PM   Sonia Baller, MA, CCC-SLP Speech Therapy [facility] Acute Rehab

## 2019-03-15 NOTE — Progress Notes (Signed)
Nurse went over discharge with patient family Via phone. Patient wife verbalized understanding. All questions and concerns addressed. Discharging home with all belongings. Taking down in a wheelchair.

## 2019-03-15 NOTE — Progress Notes (Signed)
  Echocardiogram 2D Echocardiogram has been performed.  Darlina Sicilian M 03/15/2019, 9:28 AM

## 2019-03-15 NOTE — Clinical Social Work Note (Signed)
Patient active with [facility] for PT prior to admission. Called representative to let her know patient is discharging today and has home health order for PT and OT.  Dayton Scrape, [place]

## 2019-03-15 NOTE — Discharge Instructions (Signed)
[place] were here with confusion. This is likely related to medications. These have been adjusted. For your sleep, we have recommended Seroquel instead of Ambien, but this can be discussed with your PCP. Please refrain from operating heavy machinery, including driving a car, until your mental status has returned fully to baseline. Please follow-up with your primary care physician. Your workup did not show any evidence of stroke or seizure.

## 2019-03-15 NOTE — TOC Transition Note (Signed)
Transition of Care [facility]) - CM/SW Discharge Note   Patient Details  Name: [name] MRN: [id number] Date of Birth: 09/12/1955  [facility] CM/SW Contact:  Pollie Friar, RN Phone Number: 03/15/2019, 2:14 PM   Clinical Narrative:    Pt discharging home with a resumption of his [facility] services through [place]. Butch Penny with [facility] aware of d/c and resumption orders.  Pt has transportation home.   Final next level of care: Home w Home Health Services Barriers to Discharge: No Barriers Identified   Patient Goals and CMS Choice   CMS Medicare.gov Compare Post Acute Care list provided to:: Patient Choice offered to / list presented to : Patient  Discharge Placement                       Discharge Plan and Services   Discharge Planning Services: CM Consult Post Acute Care Choice: Home Health                               Social Determinants of Health (SDOH) Interventions     Readmission Risk Interventions No flowsheet data found.

## 2019-03-15 NOTE — Discharge Summary (Addendum)
Physician Discharge Summary  [name]:[id number] DOB: Nov 15, 1955 DOA: 03/14/2019  PCP: [name], MD  Admit date: 03/14/2019 Discharge date: 03/15/2019  Admitted From: Home Disposition: Home  Recommendations for Outpatient Follow-up:  1. Follow up with PCP in 1 week 2. Please obtain BMP/CBC in one week 3. Please follow up on the following pending results: None  Home Health: PT/OT Equipment/Devices: None  Discharge Condition: Stable CODE STATUS: Full code Diet recommendation: Heart healthy   Brief/Interim Summary:  Admission HPI written by Vashti Hey, MD   History of Present Illness:   [name] is an 63 y.o. 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  Patient apparently did well postop with high doses of narcotic pain medications, Ambien, melatonin as well as intermittent use of methocarbamol.  Approximately 36 hours ago however patient wife notes that he was acting not quite himself.  Apparently he was somewhat restless walking around at nighttime and becoming increasingly agitated.  She also thinks he may have had some difficulty communicating but she was not so sure about that, he apparently was definitely having some confusion without realizing exactly where he lived.  Patient's wife initially thought this was secondary to patient taking so many medications however she does admit that he had done well on that medication regimen for 2 weeks until 36 hours ago.  She thinks he may have taken methocarbamol for the first time the night before he became confused but she is not sure.  Of note patient was also noted to be quite hypertensive yesterday although patient's wife thought that might of been because he had not taken his medications.   She gave him his blood pressure medications with some improvement in blood pressure.  Patient's wife became concerned when he did not improved and brought him to the ED.  She does not think that he has had any fevers or chills or sweats.  He has not had any vomiting or diarrhea.  Does not appear to be short of breath and has had no cough.  Patient's wife does feel that he has improved over the course of the day compared to how he was last night.  Patient himself states that he feels okay.  He is not sure if he is confused or not.  He was surprised to be told he may have had a stroke.  He does know he is in the hospital.  He does not really remember much of yesterday.  He does remember coming to the hospital with his wife today.  Patient denies abdominal pain, chest pain, headache.  Notes back pain is no different from baseline    Hospital course:  Confusion Initial concern for stroke vs polypharmacy vs seizure. Neurology consulted. Patient underwent stroke workup with a MRI and CT. EEG performed and significant for generalized irregular slow activity. Per wife, patient had already started to improve prior to admission. Most likely, symptoms related to polypharmacy. Medications adjusted prior to discharge, including cessation of Ambien, reduction of Norco dosage and addition of Seroquel. Possible patient may have underlying cognitive issues that were exacerbated.  Essential hypertension Continue home regimen.  Sarcoidosis Continue home prednisone  BPH Continue Flomax  Discharge Diagnoses:  Principal Problem:   Delirium Active Problems:   Sarcoidosis  Essential hypertension   CKD (chronic kidney disease), stage III (HCC)   Diastolic dysfunction    Discharge Instructions  Discharge Instructions    Diet - low sodium heart healthy   Complete by: As directed    Increase activity slowly   Complete by: As directed      Allergies as of 03/15/2019      Reactions   Codeine Nausea  Only   Hydromorphone Other (See Comments)   hallucinations       Medication List    STOP taking these medications   methocarbamol 500 MG tablet Commonly known as: ROBAXIN   promethazine 25 MG tablet Commonly known as: PHENERGAN   zolpidem 10 MG tablet Commonly known as: AMBIEN     TAKE these medications   albuterol 108 (90 Base) MCG/ACT inhaler Commonly known as: Proventil HFA Inhale 2 puffs into the lungs every 4 (four) hours as needed for wheezing or shortness of breath. Needs office visit for any refills.   aspirin 81 MG tablet Take 1 tablet (81 mg total) by mouth daily.   atorvastatin 20 MG tablet Commonly known as: LIPITOR Take 20 mg by mouth daily.   diltiazem 360 MG 24 hr capsule Commonly known as: CARDIZEM CD Take 360 mg by mouth daily.   fluticasone 50 MCG/ACT nasal spray Commonly known as: FLONASE Place 2 sprays into both nostrils daily as needed for allergies or rhinitis.   hydrALAZINE 50 MG tablet Commonly known as: APRESOLINE Take 50 mg by mouth 2 (two) times a day.   HYDROcodone-acetaminophen 10-325 MG tablet Commonly known as: NORCO Take 0.5 tablets by mouth every 6 (six) hours as needed. What changed:   how much to take  when to take this  reasons to take this   labetalol 300 MG tablet Commonly known as: NORMODYNE Take 300 mg by mouth 3 (three) times daily.   losartan 100 MG tablet Commonly known as: COZAAR Take 100 mg by mouth daily.   MELATONIN PO Take 12.5 mg by mouth at bedtime.   predniSONE 5 MG tablet Commonly known as: DELTASONE Take 5 mg by mouth daily. Reported on 11/02/2015   QUEtiapine 25 MG tablet Commonly known as: SEROQUEL Take 1 tablet (25 mg total) by mouth at bedtime.   tamsulosin 0.4 MG Caps capsule Commonly known as: FLOMAX Take 0.4 mg by mouth daily.      Follow-up Information    [name], MD. Schedule an appointment as soon as possible for a visit in 1 week(s).   Specialty: Internal Medicine Why:  Hospital follow-up Contact information: [name] [address] [phone]          Allergies  Allergen Reactions   Codeine Nausea Only   Hydromorphone Other (See Comments)    hallucinations     Consultations:  Neurology   Procedures/Studies: Ct Angio Head W Or Wo Contrast  Result Date: 03/14/2019 CLINICAL DATA:  63 year old male with altered mental status, confusion. EXAM: CT ANGIOGRAPHY HEAD AND NECK CT PERFUSION BRAIN TECHNIQUE: Multidetector CT imaging of the head and neck was performed using the standard protocol during bolus administration of intravenous contrast. Multiplanar CT image reconstructions and MIPs were obtained to evaluate the vascular anatomy. Carotid stenosis measurements (when applicable) are obtained utilizing NASCET criteria, using the distal internal carotid diameter as the denominator. Multiphase CT imaging of the brain was performed following IV bolus contrast injection. Subsequent parametric perfusion maps were calculated using RAPID software. CONTRAST:  143mL OMNIPAQUE IOHEXOL 350 MG/ML SOLN COMPARISON:  Head CT without contrast earlier today. FINDINGS: CT Brain Perfusion Findings: ASPECTS: 10, at 1241 hours today. CBF (<30%) Volume: None Perfusion (Tmax>6.0s) volume: 74mL, although primarily at the bilateral vertex and appears likely to be artifact. Mismatch Volume: 75mL Infarction Location:Not applicable CTA NECK Skeleton: Absent and carious dentition. Degenerative changes in the cervical spine. No acute osseous abnormality identified. Upper chest: Negative lung apices. Small calcified postinflammatory appearing lymph nodes in the visible mediastinum. Other neck: Negative. Aortic arch: Ectatic aortic arch. Three vessel configuration. No arch atherosclerosis. Right carotid system: Negative aside from arterial tortuosity. Left carotid system: Negative aside from arterial tortuosity, including a partially retropharyngeal course of the left  carotid. Vertebral arteries: Normal proximal right subclavian artery and right vertebral artery origin. Mildly tortuous right vertebral artery is patent to the skull base without stenosis. Normal proximal left subclavian artery and left vertebral artery origin. The left vertebral artery is tortuous in the V2 segment, especially the C3-C4 neural foramen, but patent to the skull base without stenosis. CTA HEAD Posterior circulation: Codominant distal vertebral arteries without stenosis. Normal bilateral PICA origins, vertebrobasilar junction. Patent basilar artery without stenosis. Normal SCA and PCA origins. Normal right posterior communicating artery, the left is diminutive or absent. Bilateral PCA branches are within normal limits. Anterior circulation: Patent ICA siphons. On the left there is mild ectasia and minimal calcified plaque without stenosis. Normal left ophthalmic artery origin. On the right there is mild ectasia and calcified plaque without stenosis. Normal right ophthalmic and posterior communicating artery origins. Patent carotid termini, MCA and ACA origins. The left A1 is dominant and the right is diminutive. The left ACA remains dominant, with an azygos type ACA appearance. ACA branches are within normal limits. Left MCA M1 segment and bifurcation are patent without stenosis. Left MCA branches are normal aside from mild tortuosity. Right MCA M1 segment and bifurcation are patent without stenosis. Right MCA branches are normal aside from mild tortuosity. Venous sinuses: Patent. Anatomic variants: Azygos type ACA anatomy with dominant left A1 and ACA. Review of the MIP images confirms the above findings IMPRESSION: 1. Negative for large vessel occlusion. And CTA is negative for age aside from generalized arterial tortuosity. 2. No core infarct detected by CT Perfusion. And suggestion of small volume penumbra at the cerebral vertex is likely artifact, perhaps related to azygos ACA anatomy (normal  variant). Electronically Signed   By: Genevie Ann M.D.   On: 03/14/2019 15:19   Dg Chest 2 View  Result Date: 03/14/2019 CLINICAL DATA:  Altered mental status and recent back surgery. EXAM: CHEST - 2 VIEW COMPARISON:  07/30/2017 FINDINGS: Heart size is upper limits of normal but likely accentuated by the AP technique. Linear densities in the right lung could represent areas of atelectasis or scarring. No significant airspace disease. Negative for pulmonary edema. Probable calcification in the right paratracheal or right hilar region. No large pleural effusions. IMPRESSION: 1. No acute chest findings. 2. Few linear densities in the right lung are suggestive for scarring or mild atelectasis. Electronically Signed   By: Markus Daft M.D.   On: 03/14/2019 12:42   Ct Head Wo Contrast  Result Date: 03/14/2019 CLINICAL DATA:  63 year old male with altered mental status. EXAM: CT HEAD WITHOUT CONTRAST TECHNIQUE: Contiguous axial images were obtained from the base of the skull through the vertex without intravenous contrast. COMPARISON:  Brain MRI dated 08/02/2017 and head CT dated 11/16/2012 FINDINGS: Brain: There is mild age-related atrophy and moderate chronic microvascular ischemic changes. There is  no acute intracranial hemorrhage. No mass effect or midline shift. No extra-axial fluid collection. Vascular: Focal area of high attenuating prominence in the left MCA within the sylvian fissure (series 3 image 25) appears similar or slightly prominent compared to the prior study. Although this may be artifactual a left MCA territory thrombus is not entirely excluded. Clinical correlation is recommended. If there is high clinical concern for acute left MCA thrombus/infarct further evaluation with MRI or CT angiography recommended. Skull: Normal. Negative for fracture or focal lesion. Sinuses/Orbits: Mild diffuse mucoperiosteal thickening of paranasal sinuses. No air-fluid level. The mastoid air cells are clear. Other: None  IMPRESSION: 1. No acute intracranial hemorrhage. 2. Age-related atrophy and chronic microvascular ischemic changes. 3. Artifact versus less likely a left MCA thrombus. If there is high clinical concern for acute left MCA thrombus/infarct further evaluation with MRI or CT angiography recommended. Electronically Signed   By: Anner Crete M.D.   On: 03/14/2019 13:12   Ct Angio Neck W And/or Wo Contrast  Result Date: 03/14/2019 CLINICAL DATA:  63 year old male with altered mental status, confusion. EXAM: CT ANGIOGRAPHY HEAD AND NECK CT PERFUSION BRAIN TECHNIQUE: Multidetector CT imaging of the head and neck was performed using the standard protocol during bolus administration of intravenous contrast. Multiplanar CT image reconstructions and MIPs were obtained to evaluate the vascular anatomy. Carotid stenosis measurements (when applicable) are obtained utilizing NASCET criteria, using the distal internal carotid diameter as the denominator. Multiphase CT imaging of the brain was performed following IV bolus contrast injection. Subsequent parametric perfusion maps were calculated using RAPID software. CONTRAST:  168mL OMNIPAQUE IOHEXOL 350 MG/ML SOLN COMPARISON:  Head CT without contrast earlier today. FINDINGS: CT Brain Perfusion Findings: ASPECTS: 10, at 1241 hours today. CBF (<30%) Volume: None Perfusion (Tmax>6.0s) volume: 65mL, although primarily at the bilateral vertex and appears likely to be artifact. Mismatch Volume: 47mL Infarction Location:Not applicable CTA NECK Skeleton: Absent and carious dentition. Degenerative changes in the cervical spine. No acute osseous abnormality identified. Upper chest: Negative lung apices. Small calcified postinflammatory appearing lymph nodes in the visible mediastinum. Other neck: Negative. Aortic arch: Ectatic aortic arch. Three vessel configuration. No arch atherosclerosis. Right carotid system: Negative aside from arterial tortuosity. Left carotid system: Negative  aside from arterial tortuosity, including a partially retropharyngeal course of the left carotid. Vertebral arteries: Normal proximal right subclavian artery and right vertebral artery origin. Mildly tortuous right vertebral artery is patent to the skull base without stenosis. Normal proximal left subclavian artery and left vertebral artery origin. The left vertebral artery is tortuous in the V2 segment, especially the C3-C4 neural foramen, but patent to the skull base without stenosis. CTA HEAD Posterior circulation: Codominant distal vertebral arteries without stenosis. Normal bilateral PICA origins, vertebrobasilar junction. Patent basilar artery without stenosis. Normal SCA and PCA origins. Normal right posterior communicating artery, the left is diminutive or absent. Bilateral PCA branches are within normal limits. Anterior circulation: Patent ICA siphons. On the left there is mild ectasia and minimal calcified plaque without stenosis. Normal left ophthalmic artery origin. On the right there is mild ectasia and calcified plaque without stenosis. Normal right ophthalmic and posterior communicating artery origins. Patent carotid termini, MCA and ACA origins. The left A1 is dominant and the right is diminutive. The left ACA remains dominant, with an azygos type ACA appearance. ACA branches are within normal limits. Left MCA M1 segment and bifurcation are patent without stenosis. Left MCA branches are normal aside from mild tortuosity. Right MCA M1 segment and  bifurcation are patent without stenosis. Right MCA branches are normal aside from mild tortuosity. Venous sinuses: Patent. Anatomic variants: Azygos type ACA anatomy with dominant left A1 and ACA. Review of the MIP images confirms the above findings IMPRESSION: 1. Negative for large vessel occlusion. And CTA is negative for age aside from generalized arterial tortuosity. 2. No core infarct detected by CT Perfusion. And suggestion of small volume penumbra at  the cerebral vertex is likely artifact, perhaps related to azygos ACA anatomy (normal variant). Electronically Signed   By: Genevie Ann M.D.   On: 03/14/2019 15:19   Mr Brain Wo Contrast  Result Date: 03/14/2019 CLINICAL DATA:  Altered mental status. Prior history of stroke. Behavioral changes beginning 36 hours ago. EXAM: MRI HEAD WITHOUT CONTRAST TECHNIQUE: Multiplanar, multiecho pulse sequences of the brain and surrounding structures were obtained without intravenous contrast. COMPARISON:  CT same day FINDINGS: Brain: Patient would only allow 5 minutes of scanning time. Examination suffers from motion degradation. Diffusion imaging shows some which motion that I believe sensitivity is significantly hindered. I do not see any acute infarction. There chronic small-vessel ischemic changes affecting the cerebral hemispheric white matter. Vascular: Major vessels at the base of the brain show flow. Skull and upper cervical spine: Negative Sinuses/Orbits: Clear/normal Other: None IMPRESSION: Abbreviated and motion degraded exam. No acute abnormality seen, but diffusion imaging sensitivity is limited given this degree of motion. Background pattern of chronic small vessel ischemic change of the white matter. Electronically Signed   By: Nelson Chimes M.D.   On: 03/14/2019 19:58   Ct Cerebral Perfusion W Contrast  Result Date: 03/14/2019 CLINICAL DATA:  63 year old male with altered mental status, confusion. EXAM: CT ANGIOGRAPHY HEAD AND NECK CT PERFUSION BRAIN TECHNIQUE: Multidetector CT imaging of the head and neck was performed using the standard protocol during bolus administration of intravenous contrast. Multiplanar CT image reconstructions and MIPs were obtained to evaluate the vascular anatomy. Carotid stenosis measurements (when applicable) are obtained utilizing NASCET criteria, using the distal internal carotid diameter as the denominator. Multiphase CT imaging of the brain was performed following IV bolus  contrast injection. Subsequent parametric perfusion maps were calculated using RAPID software. CONTRAST:  127mL OMNIPAQUE IOHEXOL 350 MG/ML SOLN COMPARISON:  Head CT without contrast earlier today. FINDINGS: CT Brain Perfusion Findings: ASPECTS: 10, at 1241 hours today. CBF (<30%) Volume: None Perfusion (Tmax>6.0s) volume: 55mL, although primarily at the bilateral vertex and appears likely to be artifact. Mismatch Volume: 51mL Infarction Location:Not applicable CTA NECK Skeleton: Absent and carious dentition. Degenerative changes in the cervical spine. No acute osseous abnormality identified. Upper chest: Negative lung apices. Small calcified postinflammatory appearing lymph nodes in the visible mediastinum. Other neck: Negative. Aortic arch: Ectatic aortic arch. Three vessel configuration. No arch atherosclerosis. Right carotid system: Negative aside from arterial tortuosity. Left carotid system: Negative aside from arterial tortuosity, including a partially retropharyngeal course of the left carotid. Vertebral arteries: Normal proximal right subclavian artery and right vertebral artery origin. Mildly tortuous right vertebral artery is patent to the skull base without stenosis. Normal proximal left subclavian artery and left vertebral artery origin. The left vertebral artery is tortuous in the V2 segment, especially the C3-C4 neural foramen, but patent to the skull base without stenosis. CTA HEAD Posterior circulation: Codominant distal vertebral arteries without stenosis. Normal bilateral PICA origins, vertebrobasilar junction. Patent basilar artery without stenosis. Normal SCA and PCA origins. Normal right posterior communicating artery, the left is diminutive or absent. Bilateral PCA branches are within normal limits.  Anterior circulation: Patent ICA siphons. On the left there is mild ectasia and minimal calcified plaque without stenosis. Normal left ophthalmic artery origin. On the right there is mild ectasia  and calcified plaque without stenosis. Normal right ophthalmic and posterior communicating artery origins. Patent carotid termini, MCA and ACA origins. The left A1 is dominant and the right is diminutive. The left ACA remains dominant, with an azygos type ACA appearance. ACA branches are within normal limits. Left MCA M1 segment and bifurcation are patent without stenosis. Left MCA branches are normal aside from mild tortuosity. Right MCA M1 segment and bifurcation are patent without stenosis. Right MCA branches are normal aside from mild tortuosity. Venous sinuses: Patent. Anatomic variants: Azygos type ACA anatomy with dominant left A1 and ACA. Review of the MIP images confirms the above findings IMPRESSION: 1. Negative for large vessel occlusion. And CTA is negative for age aside from generalized arterial tortuosity. 2. No core infarct detected by CT Perfusion. And suggestion of small volume penumbra at the cerebral vertex is likely artifact, perhaps related to azygos ACA anatomy (normal variant). Electronically Signed   By: Genevie Ann M.D.   On: 03/14/2019 15:19     03/15/2019: Transthoracic Echocardiogram IMPRESSIONS    1. The left ventricle has normal systolic function, with an ejection fraction of 55-60%. The cavity size was normal. There is mild concentric left ventricular hypertrophy. Left ventricular diastolic Doppler parameters are indeterminate. No evidence of  left ventricular regional wall motion abnormalities.  2. The right ventricle has normal systolic function. The cavity was normal. There is no increase in right ventricular wall thickness. Right ventricular systolic pressure could not be assessed.  3. Trivial pericardial effusion is present.  4. No evidence of mitral valve stenosis.  5. The aortic valve is unicuspid. Aortic valve regurgitation is trivial by color flow Doppler. No stenosis of the aortic valve. Mild aortic annular calcification noted.  6. The aortic root, ascending aorta  and aortic arch are normal in size and structure.  7. No intracardiac thrombi or masses were visualized.   Subjective: No issues this morning.  Discharge Exam: Vitals:   03/15/19 0309 03/15/19 0738  BP: (!) 147/76 (!) 164/97  Pulse: 71 71  Resp: 15 17  Temp: 98 F (36.7 C) 98.3 F (36.8 C)  SpO2: 94% 96%   Vitals:   03/14/19 2309 03/15/19 0157 03/15/19 0309 03/15/19 0738  BP: (!) 153/100  (!) 147/76 (!) 164/97  Pulse: 79  71 71  Resp: 18  15 17   Temp: 98.4 F (36.9 C)  98 F (36.7 C) 98.3 F (36.8 C)  TempSrc: Oral  Oral Oral  SpO2: 97% 96% 94% 96%  Weight:      Height:        General: Pt is alert, awake, not in acute distress Cardiovascular: RRR, S1/S2 +, no rubs, no gallops Respiratory: CTA bilaterally, no wheezing, no rhonchi Abdominal: Soft, NT, ND, bowel sounds + Extremities: no edema, no cyanosis    The results of significant diagnostics from this hospitalization (including imaging, microbiology, ancillary and laboratory) are listed below for reference.     Microbiology: Recent Results (from the past 240 hour(s))  SARS Coronavirus 2 (CEPHEID - Performed in [place] hospital lab), Hosp Order     Status: None   Collection Time: 03/14/19  3:28 PM   Specimen: Nasopharyngeal Swab  Result Value Ref Range Status   SARS Coronavirus 2 NEGATIVE NEGATIVE Final    Comment: (NOTE) If result is NEGATIVE  SARS-CoV-2 target nucleic acids are NOT DETECTED. The SARS-CoV-2 RNA is generally detectable in upper and lower  respiratory specimens during the acute phase of infection. The lowest  concentration of SARS-CoV-2 viral copies this assay can detect is 250  copies / mL. A negative result does not preclude SARS-CoV-2 infection  and should not be used as the sole basis for treatment or other  patient management decisions.  A negative result may occur with  improper specimen collection / handling, submission of specimen other  than nasopharyngeal swab, presence of  viral mutation(s) within the  areas targeted by this assay, and inadequate number of viral copies  (<250 copies / mL). A negative result must be combined with clinical  observations, patient history, and epidemiological information. If result is POSITIVE SARS-CoV-2 target nucleic acids are DETECTED. The SARS-CoV-2 RNA is generally detectable in upper and lower  respiratory specimens dur ing the acute phase of infection.  Positive  results are indicative of active infection with SARS-CoV-2.  Clinical  correlation with patient history and other diagnostic information is  necessary to determine patient infection status.  Positive results do  not rule out bacterial infection or co-infection with other viruses. If result is PRESUMPTIVE POSTIVE SARS-CoV-2 nucleic acids MAY BE PRESENT.   A presumptive positive result was obtained on the submitted specimen  and confirmed on repeat testing.  While 2019 novel coronavirus  (SARS-CoV-2) nucleic acids may be present in the submitted sample  additional confirmatory testing may be necessary for epidemiological  and / or clinical management purposes  to differentiate between  SARS-CoV-2 and other Sarbecovirus currently known to infect humans.  If clinically indicated additional testing with an alternate test  methodology [phone]) is advised. The SARS-CoV-2 RNA is generally  detectable in upper and lower respiratory sp ecimens during the acute  phase of infection. The expected result is Negative. Fact Sheet for Patients:  StrictlyIdeas.no Fact Sheet for Healthcare Providers: BankingDealers.co.za This test is not yet approved or cleared by the [place] FDA and has been authorized for detection and/or diagnosis of SARS-CoV-2 by FDA under an Emergency Use Authorization (EUA).  This EUA will remain in effect (meaning this test can be used) for the duration of the COVID-19 declaration under Section  564(b)(1) of the Act, 21 U.S.C. section 360bbb-3(b)(1), unless the authorization is terminated or revoked sooner. Performed at [facility], [place] [address], [place], [place] [address]      Labs: BNP (last 3 results) No results for input(s): BNP in the last 8760 hours. Basic Metabolic Panel: Recent Labs  Lab 03/14/19 1124  NA 139  K 3.6  CL 100  CO2 29  GLUCOSE 120*  BUN 7*  CREATININE 1.48*  CALCIUM 9.4   Liver Function Tests: Recent Labs  Lab 03/14/19 1124  AST 27  ALT 27  ALKPHOS 88  BILITOT 0.8  PROT 6.9  ALBUMIN 3.3*   No results for input(s): LIPASE, AMYLASE in the last 168 hours. Recent Labs  Lab 03/14/19 1545  AMMONIA 22   CBC: Recent Labs  Lab 03/14/19 1124  WBC 11.2*  HGB 12.6*  HCT 37.4*  MCV 88.6  PLT 370   Cardiac Enzymes: No results for input(s): CKTOTAL, CKMB, CKMBINDEX, TROPONINI in the last 168 hours. BNP: Invalid input(s): POCBNP CBG: Recent Labs  Lab 03/14/19 1144  GLUCAP 118*   D-Dimer No results for input(s): DDIMER in the last 72 hours. Hgb A1c No results for input(s): HGBA1C in the last 72 hours.  Lipid Profile Recent Labs    03/15/19 0425  CHOL 123  HDL 32*  LDLCALC 65  TRIG 130  CHOLHDL 3.8   Thyroid function studies Recent Labs    03/14/19 1545  TSH 1.259   Anemia work up Recent Labs    03/14/19 1528 03/14/19 1545  VITAMINB12 671  --   FOLATE  --  12.4   Urinalysis    Component Value Date/Time   COLORURINE YELLOW 03/14/2019 [place] 03/14/2019 1256   LABSPEC 1.011 03/14/2019 1256   PHURINE 7.0 03/14/2019 1256   GLUCOSEU NEGATIVE 03/14/2019 1256   [place] 03/14/2019 1256   [place] 03/14/2019 1256   [place] 03/14/2019 1256   PROTEINUR >=300 (A) 03/14/2019 1256   UROBILINOGEN 0.2 11/16/2012 1004   NITRITE NEGATIVE 03/14/2019 1256   LEUKOCYTESUR NEGATIVE 03/14/2019 1256   Sepsis Labs Invalid input(s): PROCALCITONIN,  WBC,   LACTICIDVEN Microbiology Recent Results (from the past 240 hour(s))  SARS Coronavirus 2 (CEPHEID - Performed in [facility] lab), Hosp Order     Status: None   Collection Time: 03/14/19  3:28 PM   Specimen: Nasopharyngeal Swab  Result Value Ref Range Status   SARS Coronavirus 2 NEGATIVE NEGATIVE Final    Comment: (NOTE) If result is NEGATIVE SARS-CoV-2 target nucleic acids are NOT DETECTED. The SARS-CoV-2 RNA is generally detectable in upper and lower  respiratory specimens during the acute phase of infection. The lowest  concentration of SARS-CoV-2 viral copies this assay can detect is 250  copies / mL. A negative result does not preclude SARS-CoV-2 infection  and should not be used as the sole basis for treatment or other  patient management decisions.  A negative result may occur with  improper specimen collection / handling, submission of specimen other  than nasopharyngeal swab, presence of viral mutation(s) within the  areas targeted by this assay, and inadequate number of viral copies  (<250 copies / mL). A negative result must be combined with clinical  observations, patient history, and epidemiological information. If result is POSITIVE SARS-CoV-2 target nucleic acids are DETECTED. The SARS-CoV-2 RNA is generally detectable in upper and lower  respiratory specimens dur ing the acute phase of infection.  Positive  results are indicative of active infection with SARS-CoV-2.  Clinical  correlation with patient history and other diagnostic information is  necessary to determine patient infection status.  Positive results do  not rule out bacterial infection or co-infection with other viruses. If result is PRESUMPTIVE POSTIVE SARS-CoV-2 nucleic acids MAY BE PRESENT.   A presumptive positive result was obtained on the submitted specimen  and confirmed on repeat testing.  While 2019 novel coronavirus  (SARS-CoV-2) nucleic acids may be present in the submitted sample   additional confirmatory testing may be necessary for epidemiological  and / or clinical management purposes  to differentiate between  SARS-CoV-2 and other Sarbecovirus currently known to infect humans.  If clinically indicated additional testing with an alternate test  methodology [phone]) is advised. The SARS-CoV-2 RNA is generally  detectable in upper and lower respiratory sp ecimens during the acute  phase of infection. The expected result is Negative. Fact Sheet for Patients:  StrictlyIdeas.no Fact Sheet for Healthcare Providers: BankingDealers.co.za This test is not yet approved or cleared by the [place] FDA and has been authorized for detection and/or diagnosis of SARS-CoV-2 by FDA under an Emergency Use Authorization (EUA).  This EUA will remain in effect (meaning this test can be used)  for the duration of the COVID-19 declaration under Section 564(b)(1) of the Act, 21 U.S.C. section 360bbb-3(b)(1), unless the authorization is terminated or revoked sooner. Performed at [facility], [place] [address], [place], [place] [address]      SIGNED:   Cordelia Poche, MD [facility] 03/15/2019, 1:02 PM

## 2019-03-15 NOTE — TOC Initial Note (Signed)
Transition of Care [facility]) - Initial/Assessment Note    Patient Details  Name: OLAF MESA MRN: [id number] Date of Birth: 23-Jul-1956  Transition of Care [facility]) CM/SW Contact:    Pollie Friar, RN Phone Number: 03/15/2019, 2:14 PM  Clinical Narrative:                   Expected Discharge Plan: [place] Barriers to Discharge: No Barriers Identified   Patient Goals and CMS Choice   CMS Medicare.gov Compare Post Acute Care list provided to:: Patient Choice offered to / list presented to : Patient  Expected Discharge Plan and Services Expected Discharge Plan: [place]   Discharge Planning Services: CM Consult Post Acute Care Choice: Home Health   Expected Discharge Date: 03/15/19                                    Prior Living Arrangements/Services   Lives with:: Spouse              Current home services: Home PT([facility])    Activities of Daily Living Home Assistive Devices/Equipment: None ADL Screening (condition at time of admission) Patient's cognitive ability adequate to safely complete daily activities?: Yes Is the patient deaf or have difficulty hearing?: No Does the patient have difficulty seeing, even when wearing glasses/contacts?: No Does the patient have difficulty concentrating, remembering, or making decisions?: Yes Patient able to express need for assistance with ADLs?: Yes Does the patient have difficulty dressing or bathing?: No Independently performs ADLs?: Yes (appropriate for developmental age) Does the patient have difficulty walking or climbing stairs?: No Weakness of Legs: Both Weakness of Arms/Hands: None  Permission Sought/Granted                  Emotional Assessment              Admission diagnosis:  Altered mental status, unspecified altered mental status type [R41.82] Patient Active Problem List   Diagnosis Date Noted  . Stroke ([place]) 03/14/2019  . CKD (chronic kidney  disease), stage III ([place])   . CVA (cerebral infarction)   . Diastolic dysfunction   . Acute gouty arthritis 03/07/2017  . Arthralgia of left foot 03/07/2017  . Nephrolithiasis   . Fever, unspecified 11/02/2015  . Essential hypertension 02/07/2015  . Pulmonary embolism ([place]) 01/30/2013  . Sarcoidosis 01/30/2013  . Hemoptysis 01/30/2013  . SHOULDER PAIN, LEFT 11/28/2009   PCP:  [name], MD Pharmacy:   [id number] - [place], [address] [place] [address] Phone: [phone] Fax: [phone]     Social Determinants of Health (SDOH) Interventions    Readmission Risk Interventions No flowsheet data found.

## 2019-03-15 NOTE — Progress Notes (Signed)
NEUROLOGY PROGRESS NOTE  Subjective: Patient has no complaints at this time.  Would like to know when he is going to go home.  Did state that he slept better last night.  Exam: Vitals:   03/15/19 0309 03/15/19 0738  BP: (!) 147/76 (!) 164/97  Pulse: 71 71  Resp: 15 17  Temp: 98 F (36.7 C) 98.3 F (36.8 C)  SpO2: 94% 96%    Physical Exam   HEENT-  Normocephalic, no lesions, without obvious abnormality.  Normal external eye and conjunctiva.  t Musculoskeletal-no joint tenderness, deformity or swelling Skin-warm and dry, no hyperpigmentation, vitiligo, or suspicious lesions    Neuro:  Mental Status: Alert, oriented, to hospital, state, the fact that he is in room 22, but he is not oriented to month.  Speech fluent without evidence of aphasia.  Able to follow 3 step commands without difficulty. Cranial Nerves: II:  Visual fields grossly normal,  III,IV, VI: ptosis not present, extra-ocular motions intact bilaterally pupils equal, round, reactive to light and accommodation V,VII: smile symmetric, facial light touch sensation normal bilaterally VIII: hearing normal bilaterally IX,X: Palate rises midline XI: bilateral shoulder shrug XII: midline tongue extension Motor: Right : Upper extremity   5/5    Left:     Upper extremity   5/5  Lower extremity   5/5     Lower extremity   5/5 Tone and bulk:normal tone throughout; no atrophy noted Sensory: Pinprick and light touch intact throughout, bilaterally Deep Tendon Reflexes: 2+ and symmetric throughout Plantars: Right: downgoing   Left: downgoing Cerebellar: normal finger-to-nose, normal rapid alternating movements and normal heel-to-shin test Gait: normal gait and station    Medications:  Prior to Admission:  Medications Prior to Admission  Medication Sig Dispense Refill Last Dose  . albuterol (PROVENTIL HFA) 108 (90 Base) MCG/ACT inhaler Inhale 2 puffs into the lungs every 4 (four) hours as needed for wheezing or shortness  of breath. Needs office visit for any refills. 1 Inhaler 0 Past Month at Unknown time  . aspirin 81 MG tablet Take 1 tablet (81 mg total) by mouth daily.   03/13/2019 at Unknown time  . atorvastatin (LIPITOR) 20 MG tablet Take 20 mg by mouth daily.   03/13/2019 at Unknown time  . diltiazem (CARDIZEM CD) 360 MG 24 hr capsule Take 360 mg by mouth daily.   03/13/2019 at Unknown time  . fluticasone (FLONASE) 50 MCG/ACT nasal spray Place 2 sprays into both nostrils daily as needed for allergies or rhinitis.   Past Week at Unknown time  . hydrALAZINE (APRESOLINE) 50 MG tablet Take 50 mg by mouth 2 (two) times a day.   03/14/2019 at Unknown time  . HYDROcodone-acetaminophen (NORCO) 10-325 MG tablet Take 1 tablet by mouth every 4 (four) hours as needed for pain.   03/13/2019 at Unknown time  . labetalol (NORMODYNE) 300 MG tablet Take 300 mg by mouth 3 (three) times daily.   03/14/2019 at 0800  . losartan (COZAAR) 100 MG tablet Take 100 mg by mouth daily.   03/14/2019 at Unknown time  . MELATONIN PO Take 12.5 mg by mouth at bedtime.   03/13/2019 at Unknown time  . methocarbamol (ROBAXIN) 500 MG tablet Take 500 mg by mouth 3 (three) times daily.   03/13/2019 at Unknown time  . predniSONE (DELTASONE) 5 MG tablet Take 5 mg by mouth daily. Reported on 11/02/2015   03/13/2019 at Unknown time  . tamsulosin (FLOMAX) 0.4 MG CAPS capsule Take 0.4 mg by mouth  daily.   03/14/2019 at Unknown time  . zolpidem (AMBIEN) 10 MG tablet Take 10 mg by mouth at bedtime.    03/13/2019 at Unknown time  . promethazine (PHENERGAN) 25 MG tablet Take 1 tablet (25 mg total) by mouth every 6 (six) hours as needed for nausea. 20 tablet 0    Scheduled: . aspirin EC  81 mg Oral Daily  . atorvastatin  20 mg Oral q1800  . enoxaparin (LOVENOX) injection  40 mg Subcutaneous Q24H  . hydrALAZINE  50 mg Oral BID  . labetalol  300 mg Oral TID  . Melatonin  12 mg Oral QHS  . pneumococcal 23 valent vaccine  0.5 mL Intramuscular Tomorrow-1000  . predniSONE  5 mg Oral  Daily  . QUEtiapine  25 mg Oral QHS  . tamsulosin  0.4 mg Oral Daily   Continuous:   Pertinent Labs/Diagnostics: EEG-Clinical Interpretation: This EEG is consistent with a mild generalized nonspecific cerebral dysfunction. There was no seizure or seizure predisposition recorded on this study.  - Ammonia 22 - Folate 12.4 -TSH 1.259 -B12 671 -RPR nonreactive - Thiamine pending - A1c pending    Ct Head Wo Contrast  Result Date: 03/14/2019 CLINICAL DATA:  63 year old male with altered mental status. EXAM: CT HEAD WITHOUT CONTRAST TECHNIQUE: Contiguous axial images were obtained from the base of the skull through the vertex without intravenous contrast. COMPARISON:  Brain MRI dated 08/02/2017 and head CT dated 11/16/2012 FINDINGS: Brain: There is mild age-related atrophy and moderate chronic microvascular ischemic changes. There is no acute intracranial hemorrhage. No mass effect or midline shift. No extra-axial fluid collection. Vascular: Focal area of high attenuating prominence in the left MCA within the sylvian fissure (series 3 image 25) appears similar or slightly prominent compared to the prior study. Although this may be artifactual a left MCA territory thrombus is not entirely excluded. Clinical correlation is recommended. If there is high clinical concern for acute left MCA thrombus/infarct further evaluation with MRI or CT angiography recommended. Skull: Normal. Negative for fracture or focal lesion. Sinuses/Orbits: Mild diffuse mucoperiosteal thickening of paranasal sinuses. No air-fluid level. The mastoid air cells are clear. Other: None IMPRESSION: 1. No acute intracranial hemorrhage. 2. Age-related atrophy and chronic microvascular ischemic changes. 3. Artifact versus less likely a left MCA thrombus. If there is high clinical concern for acute left MCA thrombus/infarct further evaluation with MRI or CT angiography recommended. Electronically Signed   By: Anner Crete M.D.   On:  03/14/2019 13:12   Ct Angio Neck W And/or Wo Contrast  Result Date: 03/14/2019 CLINICAL DATA:  63 year old male with altered mental status, confusion. EXAM: CT ANGIOGRAPHY HEAD AND NECK CT PERFUSION BRAIN TECHNIQUE:IMPRESSION: 1. Negative for large vessel occlusion. And CTA is negative for age aside from generalized arterial tortuosity. 2. No core infarct detected by CT Perfusion. And suggestion of small volume penumbra at the cerebral vertex is likely artifact, perhaps related to azygos ACA anatomy (normal variant). Electronically Signed   By: Genevie Ann M.D.   On: 03/14/2019 15:19   Mr Brain Wo Contrast  Result Date: 03/14/2019 CLINICAL DATA: IMPRESSION: Abbreviated and motion degraded exam. No acute abnormality seen, but diffusion imaging sensitivity is limited given this degree of motion. Background pattern of chronic small vessel ischemic change of the white matter. Electronically Signed   By: Nelson Chimes M.D.   On: 03/14/2019 19:58   Ct Cerebral Perfusion W Contrast  Result Date: 03/14/2019 CLINICAL DATA:  63 year old male with altered mental status, confusion.  MPRESSION: 1. Negative for large vessel occlusion. And CTA is negative for age aside from generalized arterial tortuosity. 2. No core infarct detected by CT Perfusion. And suggestion of small volume penumbra at the cerebral vertex is likely artifact, perhaps related to azygos ACA anatomy (normal variant). Electronically Signed   By: Genevie Ann M.D.   On: 03/14/2019 15:19     Etta Quill PA-C [facility] [phone]   Assessment: 63 year old male with progressive decline in memory baseline who presented with acute worsening in the setting of recent surgery, multiple pain medication usage along with lack of sleep .  Thus far all diagnostic tests have been negative.  At this point most likely represents mild delirium due to recent pain/medication use/lack of sleep.    Recommendations: - Would recommend decreasing use of  narcotics if possible -Would recommend continuing Seroquel 25 mg at night as this did help him sleep -At this time no further recommendations  Dr. Leonel Ramsay to make any further recommendations    03/15/2019, 10:00 AM

## 2019-03-16 DIAGNOSIS — R410 Disorientation, unspecified: Secondary | ICD-10-CM

## 2019-03-16 LAB — HEMOGLOBIN A1C
Hgb A1c MFr Bld: 5.8 % — ABNORMAL HIGH (ref 4.8–5.6)
Mean Plasma Glucose: 120 mg/dL

## 2019-03-20 LAB — VITAMIN B1: Vitamin B1 (Thiamine): 137.8 nmol/L (ref 66.5–200.0)

## 2019-04-24 ENCOUNTER — Encounter: Payer: Self-pay | Admitting: Orthopaedic Surgery

## 2019-04-24 ENCOUNTER — Ambulatory Visit (INDEPENDENT_AMBULATORY_CARE_PROVIDER_SITE_OTHER): Payer: BC Managed Care – PPO | Admitting: Orthopaedic Surgery

## 2019-04-24 ENCOUNTER — Ambulatory Visit: Payer: Self-pay

## 2019-04-24 DIAGNOSIS — M25511 Pain in right shoulder: Secondary | ICD-10-CM | POA: Diagnosis not present

## 2019-04-24 MED ORDER — LIDOCAINE HCL 1 % IJ SOLN
3.0000 mL | INTRAMUSCULAR | Status: AC | PRN
  Administered 2019-04-24: 3 mL

## 2019-04-24 MED ORDER — METHYLPREDNISOLONE ACETATE 40 MG/ML IJ SUSP
40.0000 mg | INTRAMUSCULAR | Status: AC | PRN
  Administered 2019-04-24: 40 mg via INTRA_ARTICULAR

## 2019-04-24 NOTE — Progress Notes (Signed)
Office Visit Note   [name]           Date of Birth: Jul 25, 1956           MRN: [id number] Visit Date: 04/24/2019              Requested by: [name], [place],  [place] [address] PCP: [name], MD   Assessment & Plan: Visit Diagnoses:  1. Acute pain of right shoulder     Plan: I still feel that this is likely a flareup of inflammation in the shoulder as it relates to shoulder positioning during a long lumbar spine surgery.  I recommend a steroid injection in the subacromial space and explained the rationale behind this as well as the risk and benefits involved.  He agreed to try a steroid injection and tolerated it well.  This did give him some pain relief.  All question concerns were answered and addressed.  Follow-up can be as needed.  I would always repeat an injection in 3 months only if needed or even getting through physical therapy with the shoulder if needed. Follow-Up Instructions: Return if symptoms worsen or fail to improve.   Orders:  Orders Placed This Encounter  Procedures  . Large Joint Inj  . XR Shoulder Right   No orders of the defined types were placed in this encounter.     Procedures: Large Joint Inj: R subacromial bursa on 04/24/2019 8:52 AM Indications: pain and diagnostic evaluation Details: 22 G 1.5 in needle  Arthrogram: No  Medications: 3 mL lidocaine 1 %; 40 mg methylPREDNISolone acetate 40 MG/ML Outcome: tolerated well, no immediate  complications Procedure, treatment alternatives, risks and benefits explained, specific risks discussed. Consent was given by the patient. Immediately prior to procedure a time out was called to verify the correct patient, procedure, equipment, support staff and site/side marked as required. Patient was prepped and draped in the usual sterile fashion.       Clinical Data: No additional findings.   Subjective: Chief Complaint  Patient presents with  . Right Shoulder -  Pain  The patient comes in for evaluation treatment of acute on chronic right shoulder pain he actually has had a history of a right shoulder arthroscopy with subacromial decompression about 5 years ago.  He recently had back surgery which was repeat lumbar spine surgery in June of this year.  His shoulder is been really sore over the last several weeks since then.  Some of this may be positional from being in the operating room for a while for his lumbar spine surgery.  His right shoulder hurts with overhead activities and he reports decreased range of motion and decreased strength in the right shoulder.  He is not a diabetic.  HPI  Review of Systems He currently denies any headache, chest pain, shortness of breath, fever, chills, nausea, vomiting  Objective: Vital Signs: There were no vitals taken for this visit.  Physical Exam He is alert and orient x3 and in no acute distress Ortho Exam examination of his right shoulder shows slow motion of the shoulder but there is no deficit of the rotator cuff.  He is stiff with internal rotation and adduction especially with reaching behind him.  He does show pain when reaching across him and overhead but he is able to abduct his shoulder without using a lot of the deltoids to abduct his shoulder.  It is clinically well located. Specialty Comments:  No specialty comments available.  Imaging: Xr Shoulder Right  Result Date: 04/24/2019 3 views of the right shoulder show no acute  findings.  The shoulder is well located and there is evidence of a previous subacromial decompression with good space in the subacromial outlet and the [facility] joint.    PMFS History: Patient Active Problem List   Diagnosis Date Noted  . Delirium 03/16/2019  . CKD (chronic kidney disease), stage III ([place])   . CVA (cerebral infarction)   . Diastolic dysfunction   . Acute gouty arthritis 03/07/2017  . Arthralgia of left foot 03/07/2017  . Nephrolithiasis   . Fever, unspecified 11/02/2015  . Essential hypertension 02/07/2015  . Pulmonary embolism ([place]) 01/30/2013  . Sarcoidosis 01/30/2013  . Hemoptysis 01/30/2013  . SHOULDER PAIN, LEFT 11/28/2009   Past Medical History:  Diagnosis Date  . Back pain   . CKD (chronic kidney disease), stage III ([place])   . CVA (cerebral infarction)   . Diastolic dysfunction   . ED (erectile dysfunction)   . GERD (gastroesophageal reflux disease)   . Hypercalcemia   . Hyperlipidemia   . Hypertension   . Insomnia   . Long-term use of high-risk medication   . Nephrocalcinosis   . Nephrolithiasis   . Obesity   . Osteopenia   . Pancreatitis   . PE (pulmonary embolism)   . Proteinuria   . Sarcoidosis   . Smoker     Family History  Problem Relation Age of Onset  . Hypertension Father     Past Surgical History:  Procedure Laterality Date  . ABDOMINAL EXPLORATION SURGERY    . BACK SURGERY    . CHOLECYSTECTOMY    . SHOULDER SURGERY     Social History   Occupational History  . Not on file  Tobacco Use  . Smoking status: Former Smoker    Types: Cigarettes  . Smokeless tobacco: Never Used  Substance and Sexual Activity  . Alcohol use: No    Alcohol/week: 0.0 standard drinks  . Drug use: No  . Sexual activity: Yes

## 2019-06-28 ENCOUNTER — Other Ambulatory Visit: Payer: Self-pay

## 2019-06-28 ENCOUNTER — Encounter: Payer: Self-pay | Admitting: Physician Assistant

## 2019-06-28 ENCOUNTER — Ambulatory Visit (INDEPENDENT_AMBULATORY_CARE_PROVIDER_SITE_OTHER): Payer: BC Managed Care – PPO | Admitting: Physician Assistant

## 2019-06-28 DIAGNOSIS — M25511 Pain in right shoulder: Secondary | ICD-10-CM

## 2019-06-28 NOTE — Progress Notes (Signed)
  HPI: [name] returns today follow-up of his right shoulder.  He had an injection on 04/24/2019.  States it was helpful for several weeks however yesterday his pain increased.  He is dropping things due to pain in the shoulder.  Shooting pain down the proximal humerus.  No numbness tingling.  Has a history of right shoulder arthroscopy with debridement and rotator cuff repair arthroscopically 08/31/2012.  He states that his shoulder pain seems similar to what he had when he had a rotator cuff tear.  He is having difficulty sleeping due to the pain in the shoulder.  He has had no new injury.   Review of systems: No fevers chills shortness breath chest pain  Physical exam: General: Awake and alert no acute distress. Bilateral shoulders he has 5 out of 5 strength with internal rotation against resistance external rotation reveals weakness on the right with external rotation against resistance.  Positive impingement testing on the right.  Positive lift off test on the right.   Impression: Right shoulder pain with weakness.  Plan: Recommend MRI of the right shoulder rule out rotator cuff tear.  Have patient follow-up after the MRI to go over results and discuss further treatment.  Questions encouraged and answered at length

## 2019-07-25 ENCOUNTER — Other Ambulatory Visit: Payer: Self-pay

## 2019-07-25 ENCOUNTER — Ambulatory Visit
Admission: RE | Admit: 2019-07-25 | Discharge: 2019-07-25 | Disposition: A | Discharge status: Home / Self Care | Payer: BC Managed Care – PPO | Source: Ambulatory Visit | Attending: Physician Assistant | Admitting: Physician Assistant

## 2019-07-25 DIAGNOSIS — M25511 Pain in right shoulder: Secondary | ICD-10-CM

## 2019-08-09 ENCOUNTER — Telehealth: Payer: Self-pay | Admitting: *Deleted

## 2019-08-09 NOTE — Telephone Encounter (Signed)
appt scheduled

## 2019-08-15 ENCOUNTER — Other Ambulatory Visit: Payer: Self-pay

## 2019-08-15 ENCOUNTER — Ambulatory Visit: Payer: BC Managed Care – PPO | Admitting: Orthopaedic Surgery

## 2019-08-15 ENCOUNTER — Encounter: Payer: Self-pay | Admitting: Orthopaedic Surgery

## 2019-08-15 DIAGNOSIS — M75121 Complete rotator cuff tear or rupture of right shoulder, not specified as traumatic: Secondary | ICD-10-CM

## 2019-08-15 NOTE — Progress Notes (Signed)
The patient comes in today to go over an MRI of his right shoulder.  He has a history of remote shoulder arthroscopy done years ago.  This was of the right shoulder.  He then developed acute pain in the shoulder in August of this year.  We saw him and provide a steroid injection in the right shoulder subacromial space.  After continued pain and decreased mobility and strength of the right shoulder we sent her for an MRI of the right shoulder.  He still has good days and bad days with the right shoulder but it hurts and is weak and he cannot lift it above his head.  On examination he does show significant weakness with abduction of the right shoulder as well as external rotation.  He has a hard time holding it abducted above his head but I can passively get it above his head.  The MRI is reviewed and does show a full-thickness retracted rotator cuff tear of the supraspinatus tendon.  Is retracted about 3.5 cm.  There is moderate muscle atrophy as well.  I did show him a shoulder model explained in detail what his shoulder MRI shows using the model.  We talked about the possibility of an arthroscopic intervention of the right shoulder with intended rotator cuff repair depending on the nature of the fibers and if we can get it repaired.  We did at least perform his significant debridement of the shoulder as well.  He has tried and failed other forms conservative treatment.  I explained in detail what the risk and benefits are of the surgery including what we would hope to gain through an intraoperative and postoperative course.  All question concerns were answered and addressed.  He does wish to proceed with the surgery.  We will work on getting it scheduled.

## 2019-09-10 ENCOUNTER — Encounter: Payer: Self-pay | Admitting: Neurology

## 2019-09-11 ENCOUNTER — Other Ambulatory Visit: Payer: Self-pay

## 2019-09-11 ENCOUNTER — Institutional Professional Consult (permissible substitution): Payer: BC Managed Care – PPO | Admitting: Neurology

## 2019-09-18 ENCOUNTER — Other Ambulatory Visit: Payer: Self-pay

## 2019-09-18 ENCOUNTER — Ambulatory Visit: Payer: BC Managed Care – PPO | Admitting: Neurology

## 2019-09-18 ENCOUNTER — Encounter: Payer: Self-pay | Admitting: Neurology

## 2019-09-18 VITALS — BP 156/100 | HR 65 | Temp 97.4°F | Ht 72.0 in | Wt 265.0 lb

## 2019-09-18 DIAGNOSIS — R351 Nocturia: Secondary | ICD-10-CM

## 2019-09-18 DIAGNOSIS — I2693 Single subsegmental pulmonary embolism without acute cor pulmonale: Secondary | ICD-10-CM | POA: Diagnosis not present

## 2019-09-18 DIAGNOSIS — I129 Hypertensive chronic kidney disease with stage 1 through stage 4 chronic kidney disease, or unspecified chronic kidney disease: Secondary | ICD-10-CM | POA: Insufficient documentation

## 2019-09-18 DIAGNOSIS — N200 Calculus of kidney: Secondary | ICD-10-CM | POA: Diagnosis not present

## 2019-09-18 DIAGNOSIS — N1832 Chronic kidney disease, stage 3b: Secondary | ICD-10-CM

## 2019-09-18 DIAGNOSIS — Z720 Tobacco use: Secondary | ICD-10-CM

## 2019-09-18 DIAGNOSIS — F5104 Psychophysiologic insomnia: Secondary | ICD-10-CM

## 2019-09-18 DIAGNOSIS — D869 Sarcoidosis, unspecified: Secondary | ICD-10-CM

## 2019-09-18 DIAGNOSIS — J45909 Unspecified asthma, uncomplicated: Secondary | ICD-10-CM

## 2019-09-18 DIAGNOSIS — I5189 Other ill-defined heart diseases: Secondary | ICD-10-CM

## 2019-09-18 DIAGNOSIS — G478 Other sleep disorders: Secondary | ICD-10-CM | POA: Diagnosis not present

## 2019-09-18 DIAGNOSIS — G9340 Encephalopathy, unspecified: Secondary | ICD-10-CM | POA: Insufficient documentation

## 2019-09-18 NOTE — Progress Notes (Signed)
SLEEP MEDICINE CLINIC    Provider:  Larey Seat, MD  Primary Care Physician:  [name], MD [name] [address]     Referring Provider: [name], [place],  [place] [address]          Chief Complaint according to patient   Patient presents with:    . 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 Here to be teste for sleep apnea.       HISTORY OF PRESENT ILLNESS:  [name] is a 64 y.o. year old biracial male patient seen on 09/18/2019 , upon referral by dr Virgina Jock. Chief concern according to patient :  " I don't think I have sleep apnea" .   I have the pleasure of seeing [name] today, a right -handed male with a possible sleep disorder. He   has a past medical history of Back pain, CKD (chronic kidney disease), stage III,  Diastolic dysfunction, ED (erectile dysfunction), GERD (gastroesophageal reflux disease), Hypercalcemia, Hyperlipidemia, renal Hypertension,  Long-term use of high-risk medication- including opioids, postsurgical delirium,  Nephrocalcinosis-lithiasis, Obesity, Osteopenia, Pancreatitis, PE (pulmonary embolism), Proteinuria, Sarcoidosis, and former smoker.  Dr. 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  Severe gallstone pancreatitis in July 2002 laparoscopic cholecystectomy in December 2002 and a tracheostomy at the time.  He has a history of recurrent retroperitoneal abscesses, osteopenia epididymitis, he has microalbuminuria proteinuria and CKD stage III.  His urologist is Dr. Ruby Cola, he had a kidney biopsy in June 2011.   Sleep relevant medical history: Nocturia/: 4-5 times, sleep walker in childhood, mother was too.  Tonsillectomy at age 74, multiple  spine surgery, no deviated septum / no UPPP.    Family medical /sleep history: mother was sleep walking .    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   and lives in a household with 7 persons- with spouse, daughter and 4 grandchildren.  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 . Tobacco use- quit 10 years ago .  ETOH use : rare , Caffeine intake in form of Coffee(  Decaffeinated ) Soda( less than one a day ) Tea ( avoids ) or energy drinks. Regular exercise in form of walking    Hobbies : " Grandchildren"   Sleep habits are as follows:  The patient's dinner time is between 6 PM. The patient goes to bed at 10 PM where he falls asleep at about 12 midnight. He watches TV some evenings in the bedroom.   Once asleep he continues to sleep for 2 hours, wakes for 4-5 bathroom breaks, the first time at 2 AM.   The preferred sleep position is supine , with the support of 2 pillows.  Dreams are reportedly infrequent.  5-6 AM is the usual rise time. The patient wakes up spontaneously. He reports not feeling refreshed or restored in AM, with symptoms such as dry mouth , but no morning headaches, mainly residual fatigue.  Naps are taken infrequently, lasting from 30 minutes and are more refreshing than nocturnal sleep.    Review of  Systems: Out of a complete 14 system review, the patient complains of only the following symptoms, and all other reviewed systems are negative.:  Fatigue, sleepiness , snoring, fragmented sleep, Insomnia , sleep hygiene -    How likely are you to doze in the following situations: 0 = not likely, 1 = slight chance, 2 = moderate chance, 3 = high chance   Sitting and Reading? Watching Television? Sitting inactive in a public place (theater or meeting)? As a passenger in a car for an hour without a break? Lying down in the afternoon when  circumstances permit? Sitting and talking to someone? Sitting quietly after lunch without alcohol? In a car, while stopped for a few minutes in traffic?   Total = 6/ 24 / 24 points   FSS endorsed at 23/ 63 points.   Social History   Socioeconomic History  . Marital status: Married    Spouse name: Not on file  . Number of children: Not on file  . Years of education: Not on file  . Highest education level: Not on file  Occupational History  . Not on file  Tobacco Use  . Smoking status: Former Smoker    Types: Cigarettes  . Smokeless tobacco: Never Used  Substance and Sexual Activity  . Alcohol use: No    Alcohol/week: 0.0 standard drinks  . Drug use: No  . Sexual activity: Yes  Other Topics Concern  . Not on file  Social History Narrative  . Not on file   Social Determinants of Health   Financial Resource Strain:   . Difficulty of Paying Living Expenses: Not on file  Food Insecurity:   . Worried About Charity fundraiser in the Last Year: Not on file  . Ran Out of Food in the Last Year: Not on file  Transportation Needs:   . Lack of Transportation (Medical): Not on file  . Lack of Transportation (Non-Medical): Not on file  Physical Activity:   . Days of Exercise per Week: Not on file  . Minutes of Exercise per Session: Not on file  Stress:   . Feeling of Stress : Not on file  Social Connections:   . Frequency of Communication with Friends and Family: Not on file  . Frequency of Social Gatherings with Friends and Family: Not on file  . Attends Religious Services: Not on file  . Active Member of Clubs or Organizations: Not on file  . Attends Archivist Meetings: Not on file  . Marital Status: Not on file    Family History  Problem Relation Age of Onset  . Hypertension Father   . CVA Father   . Lung cancer Father   . Alzheimer's disease Mother   . Hypertension Sister     Past Medical History:  Diagnosis Date  . Back pain   . CKD (chronic kidney  disease), stage III   . CVA (cerebral infarction)   . Diastolic dysfunction   . ED (erectile dysfunction)   . GERD (gastroesophageal reflux disease)   . Hypercalcemia   . Hyperlipidemia   . Hypertension   . Insomnia   . Long-term use of high-risk medication   . Nephrocalcinosis   . Nephrolithiasis   . Obesity   . Osteopenia   . Pancreatitis   . PE (pulmonary embolism)   . Proteinuria   . Sarcoidosis   . Smoker     Past Surgical History:  Procedure Laterality Date  . ABDOMINAL EXPLORATION SURGERY    .  BACK SURGERY    . CHOLECYSTECTOMY    . SHOULDER SURGERY    . TRACHEOSTOMY     closed     Current Outpatient Medications on File Prior to Visit  Medication Sig Dispense Refill  . albuterol (PROVENTIL HFA) 108 (90 Base) MCG/ACT inhaler Inhale 2 puffs into the lungs every 4 (four) hours as needed for wheezing or shortness of breath. Needs office visit for any refills. 1 Inhaler 0  . aspirin 81 MG tablet Take 1 tablet (81 mg total) by mouth daily.    Marland Kitchen atorvastatin (LIPITOR) 20 MG tablet Take 20 mg by mouth daily.    . busPIRone (BUSPAR) 7.5 MG tablet Take 7.5 mg by mouth every morning.    . cetirizine (ZYRTEC) 10 MG tablet Take 10 mg by mouth daily.    . clonazePAM (KLONOPIN) 0.5 MG tablet Take 0.5 mg by mouth at bedtime.    Marland Kitchen diltiazem (CARDIZEM CD) 360 MG 24 hr capsule Take 360 mg by mouth daily.    . fluticasone (FLONASE) 50 MCG/ACT nasal spray Place 2 sprays into both nostrils daily as needed for allergies or rhinitis.    . hydrALAZINE (APRESOLINE) 50 MG tablet Take 50 mg by mouth 2 (two) times a day.    Marland Kitchen HYDROcodone-acetaminophen (NORCO) 10-325 MG tablet Take 0.5 tablets by mouth every 6 (six) hours as needed. 30 tablet 0  . labetalol (NORMODYNE) 300 MG tablet Take 300 mg by mouth 3 (three) times daily.    Marland Kitchen losartan (COZAAR) 100 MG tablet Take 100 mg by mouth daily.    Marland Kitchen MELATONIN PO Take 12.5 mg by mouth at bedtime.    . predniSONE (DELTASONE) 5 MG tablet Take 5 mg by  mouth daily. Reported on 11/02/2015    . QUEtiapine (SEROQUEL) 25 MG tablet Take 1 tablet (25 mg total) by mouth at bedtime. 30 tablet 0  . tamsulosin (FLOMAX) 0.4 MG CAPS capsule Take 0.4 mg by mouth daily.    . traZODone (DESYREL) 100 MG tablet Take 100 mg by mouth at bedtime.     No current facility-administered medications on file prior to visit.    Allergies  Allergen Reactions  . Codeine Nausea Only  . Hydromorphone Other (See Comments)    hallucinations     Physical exam:  Today's Vitals   09/18/19 0825  BP: (!) 156/100  Pulse: 65  Temp: (!) 97.4 F (36.3 C)  Weight: 265 lb (120.2 kg)  Height: 6' (1.829 m)   Body mass index is 35.94 kg/m.   Wt Readings from Last 3 Encounters:  09/18/19 265 lb (120.2 kg)  03/14/19 255 lb 11.7 oz (116 kg)  07/30/17 260 lb (117.9 kg)     Ht Readings from Last 3 Encounters:  09/18/19 6' (1.829 m)  03/14/19 6' (1.829 m)  07/30/17 6' (1.829 m)      General: The patient is awake, alert and appears not in acute distress.  Head: Normocephalic, atraumatic. Neck is supple. Mallampati 4,  neck circumference:18 inches . Nasal airflow is patent.  Retrognathia is seen.   Cardiovascular:  Regular rate and cardiac rhythm by pulse,  without distended neck veins. Respiratory: Lungs are clear to auscultation.  Skin:  Without evidence of ankle edema, or rash. Trunk: The patient's posture is erect.   Neurologic exam : The patient is awake and alert, oriented to place and time.   Memory subjective described as intact.  Attention span & concentration ability appears normal.  Speech is fluent,  without  dysarthria, dysphonia or aphasia.  Mood and affect are appropriate.   Cranial nerves: no loss of smell or taste reported  Pupils are equal and briskly reactive to light. Funduscopic exam deferred. Extraocular movements in vertical and horizontal planes were intact and without nystagmus. No Diplopia. Visual fields by finger perimetry are  intact. Hearing was intact to soft voice and finger rubbing, tuning fork.    Facial sensation intact to fine touch.  Facial motor strength is symmetric and tongue and uvula move midline.  Neck ROM : rotation, tilt and flexion extension were normal for age and shoulder shrug was symmetrical.    Motor exam:  upper extremeties only Symmetric bulk, tone and ROM.   Normal tone without cog wheeling, symmetric grip strength - there is thenar eminence atrophy. .   Sensory:  Fine touch, pinprick and vibration were  normal.  Proprioception tested in the upper extremities was normal.   Coordination: Rapid alternating movements in the fingers/hands were of normal speed.  The Finger-to-nose maneuver was intact without evidence of  tremor.   Gait and station: Patient could rise unassisted from a seated position, walked without assistive device.  Deep tendon reflexes: in the  upper and lower extremities are symmetric and intact.  Babinski response was deferred.      After spending a total time of  40 minutes face to face including time for physical and neurologic examination, review of laboratory studies,  personal review of imaging studies, reports and results of other testing and review of referral information / records as far as provided in visit, I have established the following assessments:  1) Mr. Thoresen has been unaware if he snores, but there has been no report by his wife to him about snoring or apnea.  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  He has gained weight, has  highgrade upper airway narrowing , has had uncontrolled hypertension.   He does have multiple medical risk factors for the presence of sleep apnea including a previous tracheostomy, high hypoxemia risk because of pulmonary embolism history and former smoking, history of heart disease and CKD stage III.  There is a questionable history of CVA in 2014.  Multiple surgeries.  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 We briefly discussed sleep hygiene implementations:  no screen night in the bedroom, the bedroom should be cool, quiet and dark.  I would like him to turn the clocks around.  I think it would help to establish a certain bedtime and rise time each morning no matter how many hours of sleep he has gotten and avoiding prolonged naps over 30 minutes.  In addition to sleep initiation insomnia that has been sleep fragmentation by nocturia and sleep apnea could be a cause for frequent nocturnal bathroom breaks.    My Plan is to proceed with:  1 attended sleep study for multi morbidities in a recently retired [employer] driver.  2) we are screening for apnea , obstructive and central - he weaned off narcotics. He is still using benzodiazepines, Trazodone and Seroquel.  - hypoxemia, can be related to PE,  - nocturia with CKD 3, urocalcinosis and frequently interrupting his sleep/  I would like to thank [name], MD , [address] [place],  [place] [address] for allowing me to meet with and to  take care of this pleasant patient.   I plan to follow up either personally or through our NP within 2-3 month.   CC: I will share my notes with Dr Virgina Jock , Dr. Jeffie Pollock   Electronically signed by: Larey Seat, MD 09/18/2019 9:04 AM  [facility] Board certified by The [employer] of Sleep Medicine and Diplomate of the [employer] of Sleep Medicine. Board certified In Neurology through the [place], Fellow of the [employer] of Neurology. Medical Director of [facility].

## 2019-09-18 NOTE — Patient Instructions (Signed)
Please remember to try to maintain good sleep hygiene, which means: Keep a regular sleep and wake schedule, try not to exercise or have a meal within 2 hours of your bedtime, try to keep your bedroom conducive for sleep, that is, cool and dark, without light distractors such as an illuminated alarm clock, and refrain from watching TV right before sleep or in the middle of the night and do not keep the TV or radio on during the night. Also, try not to use or play on electronic devices at bedtime, such as your cell phone, tablet PC or laptop. If you like to read at bedtime on an electronic device, try to dim the background light as much as possible. Do not eat in the middle of the night.   We will request a sleep study.    We will look for leg twitching and snoring or sleep apnea.   For chronic insomnia, you are best followed by a psychiatrist and/or sleep psychologist.   We will call you with the sleep study results and make a follow up appointment if needed.     Screening for Sleep Apnea  Sleep apnea is a condition in which breathing pauses or becomes shallow during sleep. Sleep apnea screening is a test to determine if you are at risk for sleep apnea. The test is easy and only takes a few minutes. Your health care provider may ask you to have this test in preparation for surgery or as part of a physical exam. What are the symptoms of sleep apnea? Common symptoms of sleep apnea include:  Snoring.  Restless sleep.  Daytime sleepiness.  Pauses in breathing.  Choking during sleep.  Irritability.  Forgetfulness.  Trouble thinking clearly.  Depression.  Personality changes. Most people with sleep apnea are not aware that they have it. Why should I get screened? Getting screened for sleep apnea can help:  Ensure your safety. It is important for your health care providers to know whether or not you have sleep apnea, especially if you are having surgery or have other long-term  (chronic) health conditions.  Improve your health and allow you to get a better night's rest. Restful sleep can help you: ? Have more energy. ? Lose weight. ? Improve high blood pressure. ? Improve diabetes management. ? Prevent stroke. ? Prevent car accidents. How is screening done? Screening usually includes being asked a list of questions about your sleep quality. Some questions you may be asked include:  Do you snore?  Is your sleep restless?  Do you have daytime sleepiness?  Has a partner or spouse told you that you stop breathing during sleep?  Have you had trouble concentrating or memory loss? If your screening test is positive, you are at risk for the condition. Further testing may be needed to confirm a diagnosis of sleep apnea. Where to find more information You can find screening tools online or at your health care clinic. For more information about sleep apnea screening and healthy sleep, visit these websites:  Centers for Disease Control and Prevention: [employer]at[employer]pl  American Sleep Apnea Association: www.sleepapnea.org Contact a health care provider if:  You think that you may have sleep apnea. Summary  Sleep apnea screening can help determine if you are at risk for sleep apnea.  It is important for your health care providers to know whether or not you have sleep apnea, especially if you are having surgery or have other chronic health conditions.  You may be asked to take  a screening test for sleep apnea in preparation for surgery or as part of a physical exam. This information is not intended to replace advice given to you by your health care provider. Make sure you discuss any questions you have with your health care provider. Document Revised: 06/09/2018 Document Reviewed: 12/03/2016 Elsevier Patient Education  [place].

## 2019-10-10 ENCOUNTER — Other Ambulatory Visit: Payer: Self-pay

## 2019-10-10 ENCOUNTER — Ambulatory Visit (INDEPENDENT_AMBULATORY_CARE_PROVIDER_SITE_OTHER): Payer: BC Managed Care – PPO | Admitting: Neurology

## 2019-10-10 DIAGNOSIS — I5189 Other ill-defined heart diseases: Secondary | ICD-10-CM

## 2019-10-10 DIAGNOSIS — Z720 Tobacco use: Secondary | ICD-10-CM

## 2019-10-10 DIAGNOSIS — N200 Calculus of kidney: Secondary | ICD-10-CM

## 2019-10-10 DIAGNOSIS — I129 Hypertensive chronic kidney disease with stage 1 through stage 4 chronic kidney disease, or unspecified chronic kidney disease: Secondary | ICD-10-CM

## 2019-10-10 DIAGNOSIS — D869 Sarcoidosis, unspecified: Secondary | ICD-10-CM

## 2019-10-10 DIAGNOSIS — G4733 Obstructive sleep apnea (adult) (pediatric): Secondary | ICD-10-CM | POA: Diagnosis not present

## 2019-10-10 DIAGNOSIS — J45909 Unspecified asthma, uncomplicated: Secondary | ICD-10-CM

## 2019-10-10 DIAGNOSIS — I2693 Single subsegmental pulmonary embolism without acute cor pulmonale: Secondary | ICD-10-CM

## 2019-10-10 DIAGNOSIS — N1832 Chronic kidney disease, stage 3b: Secondary | ICD-10-CM

## 2019-10-10 DIAGNOSIS — G478 Other sleep disorders: Secondary | ICD-10-CM

## 2019-10-10 DIAGNOSIS — R351 Nocturia: Secondary | ICD-10-CM

## 2019-10-10 DIAGNOSIS — F5104 Psychophysiologic insomnia: Secondary | ICD-10-CM

## 2019-10-22 NOTE — Procedures (Signed)
PATIENT'S NAME:  [name], [name]:      Oct 05, 1955      MR#:    [id number]     DATE OF RECORDING: 10/10/2019 REFERRING M.D.:  [name] MD Study Performed:   Baseline Polysomnogram HISTORY:  Chief concern according to patient:   " I don't think I have sleep apnea".   I have the pleasure of seeing HARSH SCIPIO today, a right -handed male with a possible sleep disorder. He   has a past medical history of Back pain, CKD (chronic kidney disease), stage III, Diastolic dysfunction, ED (erectile dysfunction), GERD (gastroesophageal reflux disease), Hypercalcemia, Hyperlipidemia, renal Hypertension, long-term use of high-risk medication- including opioids,  postsurgical Delirium, Nephrocalcinosis-lithiasis, Obesity, Osteopenia, Pancreatitis, PE (pulmonary embolism), Proteinuria, Sarcoidosis, and former smoker.  Dr. 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 He was admitted for hemoptysis following a pulmonary embolism in May 2014, after he had a CVA versus medication related SVT in March 2014, he carries a diagnosis of pulmonary sarcoidosis, ADD, sarcoid related hypercalcemia, SPEP-UPEP.  Severe gallstone pancreatitis in July 2002 laparoscopic cholecystectomy in December 2002 and a tracheostomy at the time.   He has a history of recurrent retroperitoneal abscesses, osteopenia epididymitis, he has microalbuminuria proteinuria and CKD stage III.  His urologist is Dr. Ruby Cola, he had a kidney biopsy in June 2011.  The patient endorsed the Epworth Sleepiness Scale at 6 points.   The patient's weight 265 pounds with a height of 72 (inches), resulting in a BMI of 35.8 kg/m2. The patient's neck circumference measured 18 inches.  CURRENT MEDICATIONS: Albuterol, ASA 81mg , Lipitor, Buspar, Zyrtec, Klonopin, Cardizem, Flonase, Apresoline, Norco, Normodyne, Cozaar, Melatonin, Deltasone, Seroquel, Flomax,  Desyrel   PROCEDURE:  This is a multichannel digital polysomnogram utilizing the Somnostar 11.2 system.  Electrodes and sensors were applied and monitored per AASM Specifications.   EEG, EOG, Chin and Limb EMG, were sampled at 200 Hz.  ECG, Snore and Nasal Pressure, Thermal Airflow, Respiratory Effort, CPAP Flow and Pressure, Oximetry was sampled at 50 Hz. Digital video and audio were recorded.      BASELINE STUDY: Lights Out was at 22:06 and Lights On at 04:43.  Total recording time (TRT) was 398 minutes, with a total sleep time (TST) of 82 minutes.   The patient's sleep latency was 17 minutes.  REM latency was 0 minutes.  The sleep efficiency was extremely poor at 20.6 %.     SLEEP ARCHITECTURE: WASO (Wake after sleep onset) was 11.5 minutes.  There were 33.5 minutes in Stage N1, 48.5 minutes Stage N2, 0 minutes Stage N3 and 0 minutes in Stage REM.  The percentage of Stage N1 was 40.9%, Stage N2 was 59.1%, Stage N3 was 0% and Stage R (REM sleep) was 0%.   RESPIRATORY ANALYSIS:  There were a total of 8 respiratory events:  0 obstructive apneas, 0 central apneas and 0 mixed apneas with a total of 0 apneas and an apnea index (AI) of 0 /hour. There were 8 hypopneas with a hypopnea index of 5.9 /hour.      The total APNEA/HYPOPNEA INDEX (AHI) was 5.9/hour.  0 events occurred in REM sleep and 16 events in NREM. The REM AHI was  0.0 /hour, versus a non-REM AHI of 5.9. The patient spent 2 minutes of total sleep time in the supine position and 80 minutes in non-supine.. The supine AHI  was 30.0/h versus a non-supine AHI of 5.3.  OXYGEN SATURATION & C02:  The Wake baseline 02 saturation was 94%, with the lowest being 84%. Time spent below 89% saturation equaled 28 minutes. The arousals were noted as: 22 were spontaneous, 0 were associated with PLMs, 2 were associated with respiratory events. The patient had a total of 0 Periodic Limb Movements.   Audio and video analysis did not show any abnormal or unusual  movements, behaviors, phonations or vocalizations.   Snoring was noted. EKG was in keeping with normal sinus rhythm (NSR) and isolated extrasystoles.   IMPRESSION: The patient was right - he has such mild sleep apnea that treatment is not needed.   1. Very mild Obstructive Sleep Apnea (OSA) at AHI 28f 5.6 /h and very much dependent on supine sleep position.  RECOMMENDATIONS:  1. I would not treat this mild degree of OSA in a CPAP titration.  My first recommendation is to avoid supine sleep as much as possible.  2. The patient could not sleep due to pain- streamlining his pain therapy would help his fragmentation of sleep.  3. No follow up scheduled.    I certify that I have reviewed the entire raw data recording prior to the issuance of this report in accordance with the Standards of Accreditation of the American Academy of Sleep Medicine (AASM)    Larey Seat, MD Diplomat, American Board of Psychiatry and Neurology  Diplomat, American Board of Sleep Medicine Market researcher, [name] [facility] [employer]

## 2019-10-23 ENCOUNTER — Other Ambulatory Visit: Payer: Self-pay | Admitting: Neurology

## 2019-10-23 ENCOUNTER — Telehealth: Payer: Self-pay | Admitting: Neurology

## 2019-10-23 DIAGNOSIS — F5104 Psychophysiologic insomnia: Secondary | ICD-10-CM

## 2019-10-23 NOTE — Telephone Encounter (Signed)
-----   Message from Larey Seat, MD sent at 10/22/2019  5:37 PM EST ----- IMPRESSION: The patient was right - he has such mild sleep apnea that treatment is not needed.   1. Very mild Obstructive Sleep Apnea (OSA) at AHI 65f 5.6 /h and very much dependent on supine sleep position.  RECOMMENDATIONS:  1. I would not treat this mild degree of OSA in a CPAP titration.  My first recommendation is to avoid supine sleep as much as possible.  2. The patient could not sleep due to pain- streamlining his pain therapy would help his fragmentation of sleep.  3. No follow up scheduled.

## 2019-10-23 NOTE — Telephone Encounter (Signed)
Called patient to discuss sleep study results. No answer at this time. LVM for the patient to call back.   

## 2019-10-23 NOTE — Telephone Encounter (Signed)
If patient calls back please advise that the sleep study was considered to be poor sleep efficiency here in the sleep lab. Dr Dohmeier recommends we attempt Home sleep test for the patient to attempt if better night sleep would occur at home in their own environment. This will allow [place] to evaluate sleep concerns a little more further. The sleep lab will call the patient to schedule.

## 2019-10-24 NOTE — Progress Notes (Signed)
Also we did not find a sleep disorder in this limtied study, we would be able to offer a repeat due to the short sleep time- if the patient is interested

## 2019-12-10 ENCOUNTER — Ambulatory Visit (INDEPENDENT_AMBULATORY_CARE_PROVIDER_SITE_OTHER): Payer: BC Managed Care – PPO | Admitting: Neurology

## 2019-12-10 DIAGNOSIS — G4710 Hypersomnia, unspecified: Secondary | ICD-10-CM

## 2019-12-10 DIAGNOSIS — I2693 Single subsegmental pulmonary embolism without acute cor pulmonale: Secondary | ICD-10-CM

## 2019-12-10 DIAGNOSIS — J45909 Unspecified asthma, uncomplicated: Secondary | ICD-10-CM

## 2019-12-10 DIAGNOSIS — I129 Hypertensive chronic kidney disease with stage 1 through stage 4 chronic kidney disease, or unspecified chronic kidney disease: Secondary | ICD-10-CM

## 2019-12-10 DIAGNOSIS — G4733 Obstructive sleep apnea (adult) (pediatric): Secondary | ICD-10-CM

## 2019-12-10 DIAGNOSIS — D869 Sarcoidosis, unspecified: Secondary | ICD-10-CM

## 2019-12-10 DIAGNOSIS — I5189 Other ill-defined heart diseases: Secondary | ICD-10-CM

## 2019-12-10 DIAGNOSIS — N1832 Chronic kidney disease, stage 3b: Secondary | ICD-10-CM

## 2019-12-10 DIAGNOSIS — F5104 Psychophysiologic insomnia: Secondary | ICD-10-CM

## 2019-12-20 DIAGNOSIS — G4710 Hypersomnia, unspecified: Secondary | ICD-10-CM | POA: Insufficient documentation

## 2019-12-20 NOTE — Progress Notes (Signed)
Strongly REM sleep dependent form of OSA- AHI rose from 15.5/h to 37/h in REM sleep, hypoxic for a total of 34 minutes, no evidence of central apneas.   CPAP ordered.

## 2019-12-20 NOTE — Addendum Note (Signed)
Addended by: Larey Seat on: 12/20/2019 06:16 PM   Modules accepted: Orders

## 2019-12-20 NOTE — Procedures (Signed)
I will need to scan the results into the EPIC MEDIA tap- the report could not be pasted.

## 2019-12-24 ENCOUNTER — Telehealth: Payer: Self-pay | Admitting: Neurology

## 2019-12-24 NOTE — Telephone Encounter (Signed)
Called patient to discuss sleep study results. No answer at this time. LVM for the patient to call back.   

## 2019-12-24 NOTE — Telephone Encounter (Signed)
-----   Message from Larey Seat, MD sent at 12/20/2019  6:15 PM EDT ----- Strongly REM sleep dependent form of OSA- AHI rose from 15.5/h to 37/h in REM sleep, hypoxic for a total of 34 minutes, no evidence of central apneas.   CPAP ordered.

## 2019-12-26 NOTE — Telephone Encounter (Signed)
Pt has returned the call to [place], Therapist, sports .  Please call pt back

## 2019-12-26 NOTE — Telephone Encounter (Signed)
I called pt. I advised pt that Dr. Brett Fairy reviewed their sleep study results and found that pt has mild sleep apnea. Dr. Brett Fairy recommends that pt starts auto CPAP. I reviewed PAP compliance expectations with the pt. Pt asked what happens if he declines starting CPAP. I advised that not treating sleep apnea can have long term effects on the body. Advised it can cause problems with HTN, kidney issues, increased risk for stroke or irregular heart rhythm. I proceeded to move forward with reviewing what the process would look like and  advised pt that an order will be sent to a DME for them to process through insurance. Pt declined sending the order off at this time and would like to think on it. Advised the patient that if he decides to move forward to call our office back and have the phone staff go ahead and schedule him with a initial cpap follow up visit 60-70 days from the time of the call. Informed him that they would make me aware of him wanting to move forward and I will send the order off to a company that accepts insurance. Advised I would also send a letter to the address on file recapping the compliance guidelines of using the CPAP, follow up apt date that he makes as well as the number to the DME company. Pt verbalized understanding. Pt had no questions at this time but was encouraged to call back if questions arise.

## 2019-12-27 ENCOUNTER — Encounter: Payer: Self-pay | Admitting: Neurology

## 2020-01-17 NOTE — Telephone Encounter (Signed)
Received a message from [place]. Patient will have to reach out to [facility] when ready to start.  "05/03, 05/06,05/10, called pt to go over financials and scheduling, no answer lvm each time, no email on file. I am voiding out this order. Just wanted to make you aware."

## 2020-02-21 ENCOUNTER — Telehealth: Payer: Self-pay | Admitting: Neurology

## 2020-02-21 NOTE — Telephone Encounter (Signed)
This is FYI, pt called and said he has never been contacted with the results to his sleep study.  Pt was reminded of the conversation he had with Casey,RN back in April.  Pt questioned what a CPAP was.  Pt was again reminded of the conversation he had with Casey,RN and that he needed to contact [facility] to make arrangement to get his CPAP.  Pt was provided their phone #.  Pt was also informed his initial CPAP f/u appointment for 06-23 would be cancelled.  Pt was told that once using the devise he will need his initial f/u to be with 31-90 days of using the CPAP.  Pt stated he will call [facility] to begin the CPAP process.  This is FYI no call back requested.

## 2020-02-26 NOTE — Telephone Encounter (Signed)
Called the pt because he was scheduled 6/23 for a follow up visit for initial CPAP. He has not started yet. Gave him [facility] # one more time. And advised I would send a message letting them know he was ready.

## 2020-02-27 ENCOUNTER — Ambulatory Visit: Payer: BC Managed Care – PPO | Admitting: Neurology

## 2020-05-01 ENCOUNTER — Telehealth: Payer: Self-pay | Admitting: Neurology

## 2020-05-01 NOTE — Telephone Encounter (Signed)
Called the patient because he is scheduled for initial CPAP f/u on Monday 8/30. I do not see where the patient started the machine. There was no answer, LVM advising the patient to call office back and let us know if he has started his CPAP. ** If he has please ask that the patient bring his CPAP along with power cord to the visit.   **If he has not please advise the patient that the appointment is not necessary. We reviewed this information back in June when I rescheduled that apt because he had not started the machine at that time. I would advise the patient if he is wanting to continue with treating his apnea then he will have to call [facility] [phone] to get set up with CPAP. Advise the patient that we will wait to schedule the next follow up once he has started the machine.

## 2020-05-05 ENCOUNTER — Encounter: Payer: Self-pay | Admitting: Neurology

## 2020-05-05 ENCOUNTER — Ambulatory Visit: Payer: BC Managed Care – PPO | Admitting: Neurology

## 2020-05-05 ENCOUNTER — Other Ambulatory Visit: Payer: Self-pay

## 2020-05-05 VITALS — BP 158/93 | HR 62 | Ht 71.0 in | Wt 267.0 lb

## 2020-05-05 DIAGNOSIS — I2602 Saddle embolus of pulmonary artery with acute cor pulmonale: Secondary | ICD-10-CM

## 2020-05-05 DIAGNOSIS — N1832 Chronic kidney disease, stage 3b: Secondary | ICD-10-CM

## 2020-05-05 DIAGNOSIS — I2692 Saddle embolus of pulmonary artery without acute cor pulmonale: Secondary | ICD-10-CM | POA: Diagnosis not present

## 2020-05-05 DIAGNOSIS — G4733 Obstructive sleep apnea (adult) (pediatric): Secondary | ICD-10-CM | POA: Diagnosis not present

## 2020-05-05 DIAGNOSIS — I2782 Chronic pulmonary embolism: Secondary | ICD-10-CM

## 2020-05-05 NOTE — Progress Notes (Signed)
SLEEP MEDICINE CLINIC    Provider:  Larey Seat, MD  Primary Care Physician:  [name], MD [name] [address]     Referring Provider: [name], [place],   [address]          Chief Complaint according to patient   Patient presents with:    . New Patient (Initial Visit)     pt alone, rm 10. presents today this was initial CPAP follow up apt. pt states he decided not to pursue the CPAP. pt here to discuss results and if there is alternative.      05-05-2020; I meeting today with Mr. Tyr Franca. Wendt, all who underwent a home sleep test on December 10, 2019 his past medical history as stated below, he had mild sleep apnea with an AHI of 15.5 but this was strongly REM sleep dependent REM apnea-hypopnea index rose to 37/h he was borderline tachycardia bradycardic there was no positional component there were 34 minutes of hypoxemia time noted 8% of the recorded sleep time.  Based on this data CPAP is actually the only treatment option hypoxemia cannot be corrected by a dental device or inspire device, neither REM dependent apnea.  The patient has decided not to pursue CPAP but there is really no alternative for this form of apnea.  He still has trouble to initiate sleep, but this is not related to an organic sleep disorder. Also home sleep test he fell asleep within 30 minutes.  He stayed asleep for much of the night there was 1 wakefulness around 1 AM and another 1 around 430 AM after which sleep became more fragmented.     HISTORY OF PRESENT ILLNESS:  PIERSON VANTOL is a 64 y.o. year old biracial male patient seen on 05/05/2020 , upon referral by dr Virgina Jock. Chief concern according to patient :  " I don't think I have sleep apnea" .   I have the pleasure of seeing RAYLAN HANTON today, a right -handed male with a possible sleep disorder. He   has a past medical history of Back pain, CKD (chronic kidney disease), stage III,  Diastolic  dysfunction, ED (erectile dysfunction), GERD (gastroesophageal reflux disease), Hypercalcemia, Hyperlipidemia, renal Hypertension,  Long-term use of high-risk medication- including opioids, postsurgical delirium,  Nephrocalcinosis-lithiasis, Obesity, Osteopenia, Pancreatitis, PE (pulmonary embolism), Proteinuria, Sarcoidosis, and former smoker.  Dr. 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  Severe gallstone pancreatitis in July 2002 laparoscopic cholecystectomy in December 2002 and a tracheostomy at the time.  He has a history of recurrent retroperitoneal abscesses, osteopenia epididymitis, he has microalbuminuria proteinuria and CKD stage III.  His urologist is Dr. Ruby Cola, he had a kidney biopsy in June 2011.   Sleep relevant medical history: Nocturia/: 4-5 times, sleep walker in childhood, mother was too. Tonsillectomy at age 25, multiple  spine surgery, no deviated septum / no UPPP.    Family medical /sleep history: mother was sleep walking .    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   and lives in a household with 7 persons- with spouse, daughter and 4 grandchildren.  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 . Tobacco use- quit 10 years ago .  ETOH use : rare , Caffeine intake in form of Coffee(  Decaffeinated ) Soda( less than one a day ) Tea ( avoids ) or energy drinks. Regular exercise in form of walking    Hobbies : " Grandchildren"   Sleep habits are as follows:  The patient's dinner time is between 6 PM. The patient goes to bed at 10 PM where he falls asleep at about 12 midnight. He watches TV some evenings in the bedroom.   Once asleep he continues to sleep for  2 hours, wakes for 4-5 bathroom breaks, the first time at 2 AM.   The preferred sleep position is supine , with the support of 2 pillows.  Dreams are reportedly infrequent.  5-6 AM is the usual rise time. The patient wakes up spontaneously. He reports not feeling refreshed or restored in AM, with symptoms such as dry mouth , but no morning headaches, mainly residual fatigue.  Naps are taken infrequently, lasting from 30 minutes and are more refreshing than nocturnal sleep.    Review of Systems: Out of a complete 14 system review, the patient complains of only the following symptoms, and all other reviewed systems are negative.:  Fatigue, sleepiness , snoring, fragmented sleep, Insomnia , sleep hygiene -    How likely are you to doze in the following situations: 0 = not likely, 1 = slight chance, 2 = moderate chance, 3 = high chance   Sitting and Reading? Watching Television? Sitting inactive in a public place (theater or meeting)? As a passenger in a car for an hour without a break? Lying down in the afternoon when circumstances permit? Sitting and talking to someone? Sitting quietly after lunch without alcohol? In a car, while stopped for a few minutes in traffic?   Total =2 / 24 points   FSS endorsed at 23/ 63 points.   Social History   Socioeconomic History  . Marital status: Married    Spouse name: Not on file  . Number of children: Not on file  . Years of education: Not on file  . Highest education level: Not on file  Occupational History  . Not on file  Tobacco Use  . Smoking status: Former Smoker    Types: Cigarettes  . Smokeless tobacco: Never Used  Substance and Sexual Activity  . Alcohol use: No    Alcohol/week: 0.0 standard drinks  . Drug use: No  . Sexual activity: Yes  Other Topics Concern  . Not on file  Social History Narrative  . Not on file   Social Determinants of Health   Financial Resource Strain:   . Difficulty of Paying Living Expenses: Not  on file  Food Insecurity:   . Worried About Charity fundraiser in the Last Year: Not on file  . Ran Out of Food in the Last Year: Not on file  Transportation Needs:   . Lack of Transportation (Medical): Not on file  . Lack of Transportation (Non-Medical): Not on file  Physical Activity:   . Days of Exercise per Week: Not on file  . Minutes of Exercise per Session: Not on file  Stress:   . Feeling of Stress : Not on file  Social Connections:   . Frequency of Communication with Friends and Family: Not on file  . Frequency of Social Gatherings with Friends and Family: Not on file  . Attends Religious Services: Not on file  . Active  Member of Clubs or Organizations: Not on file  . Attends Archivist Meetings: Not on file  . Marital Status: Not on file    Family History  Problem Relation Age of Onset  . Hypertension Father   . CVA Father   . Lung cancer Father   . Alzheimer's disease Mother   . Hypertension Sister     Past Medical History:  Diagnosis Date  . Back pain   . CKD (chronic kidney disease), stage III   . CVA (cerebral infarction)   . Diastolic dysfunction   . ED (erectile dysfunction)   . GERD (gastroesophageal reflux disease)   . Hypercalcemia   . Hyperlipidemia   . Hypertension   . Insomnia   . Long-term use of high-risk medication   . Nephrocalcinosis   . Nephrolithiasis   . Obesity   . Osteopenia   . Pancreatitis   . PE (pulmonary embolism)   . Proteinuria   . Sarcoidosis   . Smoker     Past Surgical History:  Procedure Laterality Date  . ABDOMINAL EXPLORATION SURGERY    . BACK SURGERY    . CHOLECYSTECTOMY    . SHOULDER SURGERY    . TRACHEOSTOMY     closed     Current Outpatient Medications on File Prior to Visit  Medication Sig Dispense Refill  . albuterol (PROVENTIL HFA) 108 (90 Base) MCG/ACT inhaler Inhale 2 puffs into the lungs every 4 (four) hours as needed for wheezing or shortness of breath. Needs office visit for any  refills. 1 Inhaler 0  . aspirin 81 MG tablet Take 1 tablet (81 mg total) by mouth daily.    Marland Kitchen atorvastatin (LIPITOR) 20 MG tablet Take 20 mg by mouth daily.    . clonazePAM (KLONOPIN) 0.5 MG tablet Take 0.5 mg by mouth at bedtime.    Marland Kitchen diltiazem (CARDIZEM CD) 360 MG 24 hr capsule Take 360 mg by mouth daily.    . fluticasone (FLONASE) 50 MCG/ACT nasal spray Place 2 sprays into both nostrils daily as needed for allergies or rhinitis.    . hydrALAZINE (APRESOLINE) 50 MG tablet Take 50 mg by mouth 2 (two) times a day.    . labetalol (NORMODYNE) 300 MG tablet Take 300 mg by mouth 3 (three) times daily.    Marland Kitchen losartan (COZAAR) 100 MG tablet Take 100 mg by mouth daily.    Marland Kitchen MELATONIN PO Take 12.5 mg by mouth at bedtime.    . predniSONE (DELTASONE) 5 MG tablet Take 5 mg by mouth daily. Reported on 11/02/2015    . QUEtiapine (SEROQUEL) 25 MG tablet Take 1 tablet (25 mg total) by mouth at bedtime. 30 tablet 0  . tamsulosin (FLOMAX) 0.4 MG CAPS capsule Take 0.4 mg by mouth daily.    . traZODone (DESYREL) 100 MG tablet Take 100 mg by mouth at bedtime.     No current facility-administered medications on file prior to visit.    Allergies  Allergen Reactions  . Codeine Nausea Only  . Hydromorphone Other (See Comments)    hallucinations     Physical exam:  Today's Vitals   05/05/20 0816  BP: (!) 158/93  Pulse: 62  Weight: 267 lb (121.1 kg)  Height: 5\' 11"  (1.803 m)   Body mass index is 37.24 kg/m.   Wt Readings from Last 3 Encounters:  05/05/20 267 lb (121.1 kg)  09/18/19 265 lb (120.2 kg)  03/14/19 255 lb 11.7 oz (116 kg)     Ht Readings  from Last 3 Encounters:  05/05/20 5\' 11"  (1.803 m)  09/18/19 6' (1.829 m)  03/14/19 6' (1.829 m)      General: The patient is awake, alert and appears not in acute distress.  Head: Normocephalic, atraumatic. Neck is supple. Mallampati 4,  neck circumference:18 inches . Nasal airflow is patent.  Retrognathia is seen.   Cardiovascular:  Regular rate  and cardiac rhythm by pulse,  without distended neck veins. Respiratory: Lungs are clear to auscultation.  Skin:  Without evidence of ankle edema, or rash. Trunk: The patient's posture is erect.   Neurologic exam : The patient is awake and alert, oriented to place and time.   Memory subjective described as intact.  Attention span & concentration ability appears normal.  Speech is fluent,  without  dysarthria, dysphonia or aphasia.  Mood and affect are appropriate.     After spending a total time of 20 minutes face to face including time for physical and neurologic examination, review of laboratory studies,  personal review of imaging studies, reports and results of other testing and review of referral information / records as far as provided in visit, I have established the following assessments:  1) Mr. Sasso has been unaware if he snores, but there has been no report by his wife to him about snoring or apnea.  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  He has gained weight, has  highgrade upper airway narrowing , has had uncontrolled hypertension. He has been the same  As he was in 10-2019.   He does have multiple medical risk factors for the presence of sleep apnea including a previous tracheostomy, high hypoxemia risk because of pulmonary embolism history and former smoking, history of heart disease and CKD stage III.  There is a questionable history of CVA in 2014.  Multiple surgeries.  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 We briefly discussed sleep hygiene implementations:  no screen night in the bedroom, the bedroom should be cool, quiet and dark.  I would like him to turn the clocks around.  I think it would help to establish a certain bedtime and rise time each morning no matter how many hours of sleep he has gotten  and avoiding prolonged naps over 30 minutes.  In addition to sleep initiation insomnia that has been sleep fragmentation by nocturia and sleep apnea could be a cause for frequent nocturnal bathroom breaks.    My Plan is to proceed with:  1) I repeated that only PAP therapy can help with his kind of apnea ( REM sleep dependent with hypoxemia, and  Bradycardia),  and that the HST was not indicating more than 2 nocturia  breaks.   He declined after explaining that he already struggles with sleep an CPA would be an added discomfort.   He has to avoid screen time in bed, no TV , no devices.   I am not able to offer more for this gentlemen.    I would like to thank [name], MD , [address] [place],   [address] for allowing me to meet with his patient.    CC: I will share my notes with Dr Virgina Jock , Dr. Jeffie Pollock   Electronically signed by: Larey Seat, MD 05/05/2020 8:52 AM  [facility] Sleep Board certified by  The [employer] of Sleep Medicine and Diplomate of the [employer] of Sleep Medicine. Board certified In Neurology through the [place], Fellow of the [employer] of Neurology. Medical Director of [employer].

## 2020-05-05 NOTE — Patient Instructions (Signed)

## 2020-06-18 ENCOUNTER — Ambulatory Visit: Payer: BC Managed Care – PPO | Admitting: Orthopaedic Surgery

## 2020-06-18 ENCOUNTER — Encounter: Payer: Self-pay | Admitting: Orthopaedic Surgery

## 2020-06-18 DIAGNOSIS — M75121 Complete rotator cuff tear or rupture of right shoulder, not specified as traumatic: Secondary | ICD-10-CM

## 2020-06-18 DIAGNOSIS — G8929 Other chronic pain: Secondary | ICD-10-CM | POA: Diagnosis not present

## 2020-06-18 DIAGNOSIS — M25511 Pain in right shoulder: Secondary | ICD-10-CM

## 2020-06-18 NOTE — Progress Notes (Signed)
Office Visit Note   [name]           Date of Birth: 01-17-1956           MRN: [id number] Visit Date: 06/18/2020              Requested by: [name], [place],  [place] [address] PCP: [name], MD   Assessment & Plan: Visit Diagnoses:  1. Chronic right shoulder pain   2. Nontraumatic complete tear of right rotator cuff     Plan: I have recommended that he follow-up again with Dr. Virgina Jock to get an idea of clearance for surgery for his right shoulder.  I would like to see him back in a few months once he has seen Dr. Virgina Jock.  I offered him a steroid injection as well as physical therapy for his right shoulder and he declined these.  All questions and concerns were answered and addressed.  We will see what Dr. Virgina Jock thinks about the patient's cognitive ability and the potential for surgery on the shoulder.  Follow-Up Instructions: Return in about 3 months (around 09/18/2020).   Orders:  No orders of the defined types were placed in this encounter.  No orders of the defined types were placed in this encounter.     Procedures: No procedures performed   Clinical Data: No additional findings.   Subjective: Chief Complaint  Patient presents with  . Right Shoulder - Pain  The patient comes in today wanting to talk again about the possibility of right shoulder surgery.  At one point we had recommended an arthroscopic intervention of his right shoulder due to severe impingement syndrome and chronic tearing of the rotator cuff.  Things were delayed due to the COVID-19 pandemic.  Also his primary care physician, Dr. [name], was concerned about the patient's cognitive deficits and how anesthesia has contributed to that over time.  I explained this to the patient today in detail.  He still reports chronic right shoulder pain that does radiate down to his elbow.  He denies any neck pain on the right side.  He says he cannot reach up overhead and  behind him due to weakness in that shoulder.  He denies any numbness and tingling.  HPI  Review of Systems He currently denies any headache, chest pain, shortness of breath, fever, chills, nausea, vomiting  Objective: Vital Signs: There were no vitals taken for this visit.  Physical Exam He is alert and orient x3 and in no acute distress Ortho Exam He does ambulate mobilize slowly.  I can put his right shoulder full range of motion and there is no blocks to motion but there is weakness and pain throughout the arc of motion of his right shoulder. Specialty Comments:  No specialty comments available.  Imaging: No results found.   PMFS History: Patient Active Problem List   Diagnosis Date Noted  . Chronic right shoulder pain 06/18/2020  . OSA (obstructive sleep apnea) 05/05/2020  . Hypersomnia with sleep apnea 12/20/2019  . Psychophysiological insomnia 09/18/2019  . Nocturia more than twice per night 09/18/2019  . Renal hypertension 09/18/2019  . Moderate asthma 09/18/2019  . Tobacco abuse 09/18/2019  . Non-restorative sleep 09/18/2019  . Nontraumatic complete tear of right rotator cuff 08/15/2019  . Delirium 03/16/2019  . Stage 3b chronic kidney disease   . CVA (cerebral infarction)   . Diastolic dysfunction   . Acute gouty arthritis 03/07/2017  . Arthralgia of left  foot 03/07/2017  . Nephrolithiasis   . Fever, unspecified 11/02/2015  . Essential hypertension 02/07/2015  . Pulmonary embolism ([place]) 01/30/2013  . Sarcoidosis 01/30/2013  . Hemoptysis 01/30/2013  . SHOULDER PAIN, LEFT 11/28/2009   Past Medical History:  Diagnosis Date  . Back pain   . CKD (chronic kidney disease), stage III ([facility])   . CVA (cerebral infarction)   . Diastolic dysfunction   . ED (erectile dysfunction)   . GERD (gastroesophageal reflux disease)   . Hypercalcemia   . Hyperlipidemia   . Hypertension   . Insomnia   . Long-term use of high-risk medication   . Nephrocalcinosis   .  Nephrolithiasis   . Obesity   . Osteopenia   . Pancreatitis   . PE (pulmonary embolism)   . Proteinuria   . Sarcoidosis   . Smoker     Family History  Problem Relation Age of Onset  . Hypertension Father   . CVA Father   . Lung cancer Father   . Alzheimer's disease Mother   . Hypertension Sister     Past Surgical History:  Procedure Laterality Date  . ABDOMINAL EXPLORATION SURGERY    . BACK SURGERY    . CHOLECYSTECTOMY    . SHOULDER SURGERY    . TRACHEOSTOMY     closed   Social History   Occupational History  . Not on file  Tobacco Use  . Smoking status: Former Smoker    Types: Cigarettes  . Smokeless tobacco: Never Used  Substance and Sexual Activity  . Alcohol use: No    Alcohol/week: 0.0 standard drinks  . Drug use: No  . Sexual activity: Yes

## 2020-06-23 ENCOUNTER — Other Ambulatory Visit: Payer: Self-pay

## 2020-06-23 ENCOUNTER — Ambulatory Visit: Payer: Self-pay

## 2020-06-23 ENCOUNTER — Ambulatory Visit: Payer: BC Managed Care – PPO | Admitting: Physician Assistant

## 2020-06-23 ENCOUNTER — Encounter: Payer: Self-pay | Admitting: Physician Assistant

## 2020-06-23 DIAGNOSIS — S46311A Strain of muscle, fascia and tendon of triceps, right arm, initial encounter: Secondary | ICD-10-CM

## 2020-06-23 DIAGNOSIS — M25511 Pain in right shoulder: Secondary | ICD-10-CM | POA: Diagnosis not present

## 2020-06-23 NOTE — Progress Notes (Signed)
HPI: [name] well-known to Dr. Ninfa Linden service comes in today for right upper arm pain.  He does have a known right shoulder rotator cuff tear and is waiting seeing Dr. Virgina Jock for clearance prior to scheduling surgery.  He states he is unable to see Dr. Virgina Jock due to the fact that Dr. Keane Police office is busy.  He reports that 1 week ago he was playing with his grandson heard a pop in his right shoulder.  He states he has had decreased range of motion of the right shoulder and right elbow since that time.  He has pain mainly at the elbow at this point in time.  ROS: See HPI  Physical exam: Passive fluid motion of the right shoulder.  Nontender over the proximal biceps tendon bicipital groove.  There is no distalization of the right biceps muscle belly.  Ecchymosis is seen posterior aspect of the right distal humerus.  He is nontender over the attachment of the triceps.  Distal triceps muscle belly is tender.  He has full extension of the elbow and full flexion.  5/5 strength with extension bilateral elbows against resistance.  Full supination pronation of bilateral forearms.  Nontender over the medial lateral epicondyle of the right elbow.  Impression: Right triceps strain  Plan: Recommend relative rest ice and heat to the area.  In regards to his right shoulder he will follow-up with Dr. Virgina Jock about getting surgical clearance prior to scheduling for shoulder arthroscopy and potential rotator cuff repair.  Questions were encouraged and answered.  Follow-up as needed

## 2020-07-05 ENCOUNTER — Ambulatory Visit: Payer: BC Managed Care – PPO | Attending: Internal Medicine

## 2020-07-05 DIAGNOSIS — Z23 Encounter for immunization: Secondary | ICD-10-CM

## 2020-07-05 NOTE — Progress Notes (Signed)
° °  Covid-19 Vaccination Clinic  Name:  [name]    MRN: [id number] DOB: [birth year]  07/05/2020  [name] was observed post Covid-19 immunization for 15 minutes without incident. He was provided with Vaccine Information Sheet and instruction to access the V-Safe system.   [name] was instructed to call 911 with any severe reactions post vaccine:  Difficulty breathing   Swelling of face and throat   A fast heartbeat   A bad rash all over body   Dizziness and weakness

## 2020-07-24 ENCOUNTER — Telehealth: Payer: Self-pay

## 2020-07-24 NOTE — Telephone Encounter (Signed)
Patient left message inquiring about shoulder surgery.  Please advise how to proceed.  Thanks!  [phone]

## 2020-07-28 NOTE — Telephone Encounter (Signed)
I saw the notes from Dr. Virgina Jock recently.  That is his primary care physician.  It stated that we should just go ahead and proceed with shoulder surgery understanding that he can always have cognitive issues afterwards from anesthesia.  He only needs to be done at either [facility] versus [facility] so he can possibly stay overnight if needed.

## 2020-08-07 NOTE — Telephone Encounter (Signed)
I called patient and left voice mail for return call. °

## 2020-09-08 ENCOUNTER — Encounter: Payer: Self-pay | Admitting: Podiatry

## 2020-09-08 ENCOUNTER — Ambulatory Visit: Payer: BC Managed Care – PPO | Admitting: Podiatry

## 2020-09-08 ENCOUNTER — Other Ambulatory Visit: Payer: Self-pay

## 2020-09-08 DIAGNOSIS — R52 Pain, unspecified: Secondary | ICD-10-CM

## 2020-09-08 DIAGNOSIS — L84 Corns and callosities: Secondary | ICD-10-CM | POA: Diagnosis not present

## 2020-09-08 DIAGNOSIS — Q828 Other specified congenital malformations of skin: Secondary | ICD-10-CM | POA: Diagnosis not present

## 2020-09-08 NOTE — Patient Instructions (Addendum)
Look for urea 40% cream or ointment and apply to the thickened dry skin / calluses. This can be bought over the counter, at a pharmacy or online such as [employer].   Alternatively, salicylic acid [id number] cream or ointment is helpful   Use a pumice stone daily to keep the lesion smoother Corns and Calluses Corns are small areas of thickened skin that occur on the top, sides, or tip of a toe. They contain a cone-shaped core with a point that can press on a nerve below. This causes pain.  Calluses are areas of thickened skin that can occur anywhere on the body, including the hands, fingers, palms, soles of the feet, and heels. Calluses are usually larger than corns. What are the causes? Corns and calluses are caused by rubbing (friction) or pressure, such as from shoes that are too tight or do not fit properly. What increases the risk? Corns are more likely to develop in people who have misshapen toes (toe deformities), such as hammer toes. Calluses can occur with friction to any area of the skin. They are more likely to develop in people who:  Work with their hands.  Wear shoes that fit poorly, are too tight, or are high-heeled.  Have toe deformities. What are the signs or symptoms? Symptoms of a corn or callus include:  A hard growth on the skin.  Pain or tenderness under the skin.  Redness and swelling.  Increased discomfort while wearing tight-fitting shoes, if your feet are affected. If a corn or callus becomes infected, symptoms may include:  Redness and swelling that gets worse.  Pain.  Fluid, blood, or pus draining from the corn or callus. How is this diagnosed? Corns and calluses may be diagnosed based on your symptoms, your medical history, and a physical exam. How is this treated? Treatment for corns and calluses may include:  Removing the cause of the friction or pressure. This may involve: ? Changing your shoes. ? Wearing shoe inserts (orthotics) or other protective  layers in your shoes, such as a corn pad. ? Wearing gloves.  Applying medicine to the skin (topical medicine) to help soften skin in the hardened, thickened areas.  Removing layers of dead skin with a file to reduce the size of the corn or callus.  Removing the corn or callus with a scalpel or laser.  Taking antibiotic medicines, if your corn or callus is infected.  Having surgery, if a toe deformity is the cause. Follow these instructions at home:   Take over-the-counter and prescription medicines only as told by your health care provider.  If you were prescribed an antibiotic, take it as told by your health care provider. Do not stop taking it even if your condition starts to improve.  Wear shoes that fit well. Avoid wearing high-heeled shoes and shoes that are too tight or too loose.  Wear any padding, protective layers, gloves, or orthotics as told by your health care provider.  Soak your hands or feet and then use a file or pumice stone to soften your corn or callus. Do this as told by your health care provider.  Check your corn or callus every day for symptoms of infection. Contact a health care provider if you:  Notice that your symptoms do not improve with treatment.  Have redness or swelling that gets worse.  Notice that your corn or callus becomes painful.  Have fluid, blood, or pus coming from your corn or callus.  Have new symptoms. Summary  Corns  are small areas of thickened skin that occur on the top, sides, or tip of a toe.  Calluses are areas of thickened skin that can occur anywhere on the body, including the hands, fingers, palms, and soles of the feet. Calluses are usually larger than corns.  Corns and calluses are caused by rubbing (friction) or pressure, such as from shoes that are too tight or do not fit properly.  Treatment may include wearing any padding, protective layers, gloves, or orthotics as told by your health care provider. This  information is not intended to replace advice given to you by your health care provider. Make sure you discuss any questions you have with your health care provider. Document Revised: 12/13/2018 Document Reviewed: 07/06/2017 Elsevier Patient Education  2020 [employer].

## 2020-09-08 NOTE — Progress Notes (Signed)
  Subjective:  Patient ID: [name], male    DOB: [birth year],  MRN: [id number]  Chief Complaint  Patient presents with  . Callouses    Patient presents today for painful callous/blister bottom of right hallux x 2 months.  He says it feels like he is walking on a piece of glass.  He has only used corn pads for relief    65 y.o. male presents with the above complaint. History confirmed with patient.   Objective:  Physical Exam: warm, good capillary refill, no trophic changes or ulcerative lesions, normal DP and PT pulses and normal sensory exam.   Right Foot: porokeratosis sub hallux painful to palpation with macerated skin around lesion  Assessment:   1. Porokeratosis   2. Corns and callosities   3. Pain      Plan:  Patient was evaluated and treated and all questions answered.   All symptomatic hyperkeratoses were safely debrided with a sterile #15 blade to patient's level of comfort without incident. We discussed preventative and palliative care of these lesions including supportive and accommodative shoegear, padding, prefabricated and custom molded accommodative orthoses, use of a pumice stone and lotions/creams daily.  Recommended urea cream and/or salicylic acid to get rid of the lesion. Applied salinocaine ointment today with bandage.  Return if symptoms worsen or fail to improve.

## 2020-09-18 ENCOUNTER — Encounter: Payer: Self-pay | Admitting: Orthopaedic Surgery

## 2020-09-18 ENCOUNTER — Ambulatory Visit (INDEPENDENT_AMBULATORY_CARE_PROVIDER_SITE_OTHER): Payer: BC Managed Care – PPO | Admitting: Orthopaedic Surgery

## 2020-09-18 ENCOUNTER — Other Ambulatory Visit: Payer: Self-pay

## 2020-09-18 DIAGNOSIS — M25511 Pain in right shoulder: Secondary | ICD-10-CM | POA: Diagnosis not present

## 2020-09-18 DIAGNOSIS — G8929 Other chronic pain: Secondary | ICD-10-CM

## 2020-09-18 DIAGNOSIS — M7541 Impingement syndrome of right shoulder: Secondary | ICD-10-CM

## 2020-09-18 DIAGNOSIS — M75121 Complete rotator cuff tear or rupture of right shoulder, not specified as traumatic: Secondary | ICD-10-CM | POA: Diagnosis not present

## 2020-09-18 NOTE — Progress Notes (Signed)
The patient is well-known to me.  He is finally scheduled for a right shoulder arthroscopy due to a full-thickness nontraumatic rotator cuff tear and chronic shoulder pain.  This will be on February 3 at [facility] outpatient surgical center on [address].  We have had to delay surgery waiting for medical clearance.  Mainly this was just related to cognitive deficits that the patient has had previously after anesthesia.  He is fully aware of this and has tried everything conservatively to try to get his shoulder feeling better.  On exam his right shoulder is still significantly weak and pain with overhead activities.  There is some grinding at the glenohumeral joint as well.  He has had no other acute change in medical status.  Was able to review his chart thoroughly and he is very well-known to me having seen him for many years now.  We we will see him the day of surgery and then 1 week postoperative.  I showed him a shoulder model and explained in detail what the surgery involves as well as a thorough risk and benefits discussion of surgery.

## 2020-09-26 ENCOUNTER — Other Ambulatory Visit: Payer: Self-pay

## 2020-10-01 ENCOUNTER — Other Ambulatory Visit: Payer: Self-pay | Admitting: Physician Assistant

## 2020-10-06 ENCOUNTER — Other Ambulatory Visit (HOSPITAL_COMMUNITY)
Admission: RE | Admit: 2020-10-06 | Discharge: 2020-10-06 | Disposition: A | Discharge status: Home / Self Care | Payer: BC Managed Care – PPO | Source: Ambulatory Visit | Attending: Orthopaedic Surgery | Admitting: Orthopaedic Surgery

## 2020-10-06 DIAGNOSIS — U071 COVID-19: Secondary | ICD-10-CM | POA: Insufficient documentation

## 2020-10-06 DIAGNOSIS — Z01812 Encounter for preprocedural laboratory examination: Secondary | ICD-10-CM | POA: Insufficient documentation

## 2020-10-06 LAB — SARS CORONAVIRUS 2 (TAT 6-24 HRS): SARS Coronavirus 2: POSITIVE — AB

## 2020-10-07 ENCOUNTER — Telehealth (HOSPITAL_COMMUNITY): Payer: Self-pay | Admitting: Family

## 2020-10-07 NOTE — Telephone Encounter (Signed)
Called to discuss with [name] about Covid symptoms and the use of casirivimab/imdevimab, a combination monoclonal antibody infusion for those with mild to moderate Covid symptoms and at a high risk of hospitalization.     Pt does not qualify for infusion therapy as he has asymptomatic infection. Isolation precautions discussed. Advised to contact back for consideration should he develop symptoms. Patient verbalized understanding.    Patient Active Problem List   Diagnosis Date Noted  . Chronic right shoulder pain 06/18/2020  . OSA (obstructive sleep apnea) 05/05/2020  . Hypersomnia with sleep apnea 12/20/2019  . Psychophysiological insomnia 09/18/2019  . Nocturia more than twice per night 09/18/2019  . Renal hypertension 09/18/2019  . Moderate asthma 09/18/2019  . Tobacco abuse 09/18/2019  . Non-restorative sleep 09/18/2019  . Nontraumatic complete tear of right rotator cuff 08/15/2019  . Delirium 03/16/2019  . Stage 3b chronic kidney disease   . CVA (cerebral infarction)   . Diastolic dysfunction   . Pseudarthrosis 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 02/28/2019  . Intervertebral disc disorder with radiculopathy of lumbar region 09/13/2017  . Acute gouty arthritis 03/07/2017  . Arthralgia of left foot 03/07/2017  . Nephrolithiasis   . Fever, unspecified 11/02/2015  . Essential hypertension 02/07/2015  . Pulmonary embolism ([place]) 01/30/2013  . Sarcoidosis 01/30/2013  . Hemoptysis 01/30/2013  . SHOULDER PAIN, LEFT 11/28/2009    [name],NP

## 2020-10-07 NOTE — Progress Notes (Signed)
Called and spoke w/ Kandice Hams, [place] scheduler for Dr Kathrynn Speed, inform her of pt covid test positive and will need to reschedule from 10-09-198. Sherri stated that Dr Ninfa Linden just had told he saw positive result in epic and to call pt.

## 2020-10-09 ENCOUNTER — Encounter (HOSPITAL_BASED_OUTPATIENT_CLINIC_OR_DEPARTMENT_OTHER): Admission: RE | Payer: Self-pay | Source: Home / Self Care

## 2020-10-09 ENCOUNTER — Ambulatory Visit (HOSPITAL_BASED_OUTPATIENT_CLINIC_OR_DEPARTMENT_OTHER)
Admission: RE | Admit: 2020-10-09 | Payer: BC Managed Care – PPO | Source: Home / Self Care | Admitting: Orthopaedic Surgery

## 2020-10-09 SURGERY — SHOULDER ARTHROSCOPY WITH ROTATOR CUFF REPAIR AND SUBACROMIAL DECOMPRESSION
Anesthesia: General | Site: Shoulder | Laterality: Right

## 2020-10-15 ENCOUNTER — Ambulatory Visit: Payer: BC Managed Care – PPO | Admitting: Orthopaedic Surgery

## 2020-10-22 ENCOUNTER — Encounter (HOSPITAL_BASED_OUTPATIENT_CLINIC_OR_DEPARTMENT_OTHER): Payer: Self-pay | Admitting: Orthopaedic Surgery

## 2020-10-22 ENCOUNTER — Other Ambulatory Visit: Payer: Self-pay

## 2020-10-28 ENCOUNTER — Encounter (HOSPITAL_BASED_OUTPATIENT_CLINIC_OR_DEPARTMENT_OTHER)
Admission: RE | Admit: 2020-10-28 | Discharge: 2020-10-28 | Disposition: A | Discharge status: Home / Self Care | Payer: BC Managed Care – PPO | Source: Ambulatory Visit | Attending: Orthopaedic Surgery | Admitting: Orthopaedic Surgery

## 2020-10-28 ENCOUNTER — Telehealth: Payer: Self-pay

## 2020-10-28 DIAGNOSIS — M7541 Impingement syndrome of right shoulder: Secondary | ICD-10-CM | POA: Diagnosis not present

## 2020-10-28 DIAGNOSIS — M19011 Primary osteoarthritis, right shoulder: Secondary | ICD-10-CM | POA: Diagnosis not present

## 2020-10-28 DIAGNOSIS — Z7982 Long term (current) use of aspirin: Secondary | ICD-10-CM | POA: Diagnosis not present

## 2020-10-28 DIAGNOSIS — G8929 Other chronic pain: Secondary | ICD-10-CM | POA: Diagnosis not present

## 2020-10-28 DIAGNOSIS — Z86711 Personal history of pulmonary embolism: Secondary | ICD-10-CM | POA: Diagnosis not present

## 2020-10-28 DIAGNOSIS — Z87891 Personal history of nicotine dependence: Secondary | ICD-10-CM | POA: Diagnosis not present

## 2020-10-28 DIAGNOSIS — M6250 Muscle wasting and atrophy, not elsewhere classified, unspecified site: Secondary | ICD-10-CM | POA: Diagnosis not present

## 2020-10-28 DIAGNOSIS — Z01810 Encounter for preprocedural cardiovascular examination: Secondary | ICD-10-CM | POA: Insufficient documentation

## 2020-10-28 DIAGNOSIS — Z7952 Long term (current) use of systemic steroids: Secondary | ICD-10-CM | POA: Diagnosis not present

## 2020-10-28 DIAGNOSIS — Z79899 Other long term (current) drug therapy: Secondary | ICD-10-CM | POA: Diagnosis not present

## 2020-10-28 DIAGNOSIS — Z9889 Other specified postprocedural states: Secondary | ICD-10-CM | POA: Diagnosis not present

## 2020-10-28 DIAGNOSIS — M75121 Complete rotator cuff tear or rupture of right shoulder, not specified as traumatic: Secondary | ICD-10-CM | POA: Diagnosis not present

## 2020-10-28 DIAGNOSIS — Z8673 Personal history of transient ischemic attack (TIA), and cerebral infarction without residual deficits: Secondary | ICD-10-CM | POA: Diagnosis not present

## 2020-10-28 MED ORDER — OXYCODONE HCL 5 MG PO TABS: 5.0000 mg | ORAL_TABLET | Freq: Four times a day (QID) | ORAL | 0 refills | Status: DC | PRN

## 2020-10-28 NOTE — Telephone Encounter (Signed)
Pt called and informed.

## 2020-10-28 NOTE — Telephone Encounter (Signed)
I went ahead and send in some pain medication to his pharmacy.

## 2020-10-28 NOTE — Progress Notes (Signed)

## 2020-10-28 NOTE — Telephone Encounter (Signed)
Pt called asking when he can get his wife to pick up his medication before his surgery

## 2020-10-30 ENCOUNTER — Ambulatory Visit (HOSPITAL_BASED_OUTPATIENT_CLINIC_OR_DEPARTMENT_OTHER)
Admission: RE | Admit: 2020-10-30 | Discharge: 2020-10-30 | Disposition: A | Discharge status: Home / Self Care | Payer: BC Managed Care – PPO | Attending: Orthopaedic Surgery | Admitting: Orthopaedic Surgery

## 2020-10-30 ENCOUNTER — Ambulatory Visit (HOSPITAL_BASED_OUTPATIENT_CLINIC_OR_DEPARTMENT_OTHER): Payer: BC Managed Care – PPO | Admitting: Anesthesiology

## 2020-10-30 ENCOUNTER — Encounter (HOSPITAL_BASED_OUTPATIENT_CLINIC_OR_DEPARTMENT_OTHER)
Admission: RE | Disposition: A | Discharge status: Home / Self Care | Payer: Self-pay | Source: Home / Self Care | Attending: Orthopaedic Surgery

## 2020-10-30 ENCOUNTER — Other Ambulatory Visit: Payer: Self-pay

## 2020-10-30 ENCOUNTER — Encounter (HOSPITAL_BASED_OUTPATIENT_CLINIC_OR_DEPARTMENT_OTHER): Payer: Self-pay | Admitting: Orthopaedic Surgery

## 2020-10-30 DIAGNOSIS — Z79899 Other long term (current) drug therapy: Secondary | ICD-10-CM | POA: Insufficient documentation

## 2020-10-30 DIAGNOSIS — M19011 Primary osteoarthritis, right shoulder: Secondary | ICD-10-CM | POA: Insufficient documentation

## 2020-10-30 DIAGNOSIS — M75121 Complete rotator cuff tear or rupture of right shoulder, not specified as traumatic: Secondary | ICD-10-CM | POA: Diagnosis present

## 2020-10-30 DIAGNOSIS — M7541 Impingement syndrome of right shoulder: Secondary | ICD-10-CM | POA: Insufficient documentation

## 2020-10-30 DIAGNOSIS — Z9889 Other specified postprocedural states: Secondary | ICD-10-CM | POA: Insufficient documentation

## 2020-10-30 DIAGNOSIS — Z7982 Long term (current) use of aspirin: Secondary | ICD-10-CM | POA: Insufficient documentation

## 2020-10-30 DIAGNOSIS — M6250 Muscle wasting and atrophy, not elsewhere classified, unspecified site: Secondary | ICD-10-CM | POA: Insufficient documentation

## 2020-10-30 DIAGNOSIS — Z86711 Personal history of pulmonary embolism: Secondary | ICD-10-CM | POA: Insufficient documentation

## 2020-10-30 DIAGNOSIS — Z8673 Personal history of transient ischemic attack (TIA), and cerebral infarction without residual deficits: Secondary | ICD-10-CM | POA: Insufficient documentation

## 2020-10-30 DIAGNOSIS — Z87891 Personal history of nicotine dependence: Secondary | ICD-10-CM | POA: Insufficient documentation

## 2020-10-30 DIAGNOSIS — G8929 Other chronic pain: Secondary | ICD-10-CM | POA: Insufficient documentation

## 2020-10-30 DIAGNOSIS — Z7952 Long term (current) use of systemic steroids: Secondary | ICD-10-CM | POA: Insufficient documentation

## 2020-10-30 HISTORY — PX: SHOULDER ARTHROSCOPY: SHX128

## 2020-10-30 SURGERY — ARTHROSCOPY, SHOULDER
Anesthesia: General | Site: Shoulder | Laterality: Right

## 2020-10-30 MED ORDER — LACTATED RINGERS IV SOLN: INTRAVENOUS | Status: DC

## 2020-10-30 MED ORDER — DEXAMETHASONE SODIUM PHOSPHATE 10 MG/ML IJ SOLN
INTRAMUSCULAR | Status: AC
  Filled 2020-10-30: qty 1

## 2020-10-30 MED ORDER — OXYCODONE HCL 5 MG PO TABS: 5.0000 mg | ORAL_TABLET | Freq: Once | ORAL | Status: DC | PRN

## 2020-10-30 MED ORDER — ALBUTEROL SULFATE (2.5 MG/3ML) 0.083% IN NEBU
2.5000 mg | INHALATION_SOLUTION | RESPIRATORY_TRACT | Status: AC | PRN
  Administered 2020-10-30: 2.5 mg via RESPIRATORY_TRACT

## 2020-10-30 MED ORDER — CEFAZOLIN SODIUM-DEXTROSE 2-4 GM/100ML-% IV SOLN
2.0000 g | INTRAVENOUS | Status: AC
  Administered 2020-10-30: 2 g via INTRAVENOUS

## 2020-10-30 MED ORDER — MIDAZOLAM HCL 2 MG/2ML IJ SOLN
INTRAMUSCULAR | Status: AC
  Filled 2020-10-30: qty 2

## 2020-10-30 MED ORDER — CEFAZOLIN SODIUM-DEXTROSE 2-4 GM/100ML-% IV SOLN
INTRAVENOUS | Status: AC
  Filled 2020-10-30: qty 100

## 2020-10-30 MED ORDER — ONDANSETRON HCL 4 MG/2ML IJ SOLN
INTRAMUSCULAR | Status: DC | PRN
  Administered 2020-10-30: 4 mg via INTRAVENOUS

## 2020-10-30 MED ORDER — DEXAMETHASONE SODIUM PHOSPHATE 10 MG/ML IJ SOLN
INTRAMUSCULAR | Status: DC | PRN
  Administered 2020-10-30: 10 mg via INTRAVENOUS

## 2020-10-30 MED ORDER — FENTANYL CITRATE (PF) 100 MCG/2ML IJ SOLN
INTRAMUSCULAR | Status: AC
  Filled 2020-10-30: qty 2

## 2020-10-30 MED ORDER — FENTANYL CITRATE (PF) 100 MCG/2ML IJ SOLN: 25.0000 ug | INTRAMUSCULAR | Status: DC | PRN

## 2020-10-30 MED ORDER — FENTANYL CITRATE (PF) 100 MCG/2ML IJ SOLN
50.0000 ug | Freq: Once | INTRAMUSCULAR | Status: AC
  Administered 2020-10-30: 50 ug via INTRAVENOUS

## 2020-10-30 MED ORDER — BUPIVACAINE-EPINEPHRINE (PF) 0.5% -1:200000 IJ SOLN
INTRAMUSCULAR | Status: DC | PRN
  Administered 2020-10-30: 25 mL via PERINEURAL

## 2020-10-30 MED ORDER — PROPOFOL 10 MG/ML IV BOLUS
INTRAVENOUS | Status: DC | PRN
  Administered 2020-10-30: 150 mg via INTRAVENOUS

## 2020-10-30 MED ORDER — EPHEDRINE 5 MG/ML INJ
INTRAVENOUS | Status: AC
  Filled 2020-10-30: qty 10

## 2020-10-30 MED ORDER — OXYCODONE HCL 5 MG/5ML PO SOLN: 5.0000 mg | Freq: Once | ORAL | Status: DC | PRN

## 2020-10-30 MED ORDER — ROCURONIUM BROMIDE 10 MG/ML (PF) SYRINGE
PREFILLED_SYRINGE | INTRAVENOUS | Status: AC
  Filled 2020-10-30: qty 10

## 2020-10-30 MED ORDER — ALBUTEROL SULFATE (2.5 MG/3ML) 0.083% IN NEBU
INHALATION_SOLUTION | RESPIRATORY_TRACT | Status: AC
  Filled 2020-10-30: qty 3

## 2020-10-30 MED ORDER — LIDOCAINE 2% (20 MG/ML) 5 ML SYRINGE
INTRAMUSCULAR | Status: AC
  Filled 2020-10-30: qty 5

## 2020-10-30 MED ORDER — MIDAZOLAM HCL 2 MG/2ML IJ SOLN
1.0000 mg | Freq: Once | INTRAMUSCULAR | Status: AC
  Administered 2020-10-30: 1 mg via INTRAVENOUS

## 2020-10-30 MED ORDER — ONDANSETRON HCL 4 MG/2ML IJ SOLN: 4.0000 mg | Freq: Four times a day (QID) | INTRAMUSCULAR | Status: DC | PRN

## 2020-10-30 MED ORDER — ONDANSETRON 4 MG PO TBDP: 4.0000 mg | ORAL_TABLET | Freq: Three times a day (TID) | ORAL | 0 refills | Status: DC | PRN

## 2020-10-30 MED ORDER — FENTANYL CITRATE (PF) 100 MCG/2ML IJ SOLN
INTRAMUSCULAR | Status: DC | PRN
  Administered 2020-10-30: 50 ug via INTRAVENOUS

## 2020-10-30 MED ORDER — EPHEDRINE SULFATE-NACL 50-0.9 MG/10ML-% IV SOSY
PREFILLED_SYRINGE | INTRAVENOUS | Status: DC | PRN
  Administered 2020-10-30 (×5): 10 mg via INTRAVENOUS

## 2020-10-30 MED ORDER — ONDANSETRON HCL 4 MG/2ML IJ SOLN
INTRAMUSCULAR | Status: AC
  Filled 2020-10-30: qty 2

## 2020-10-30 MED ORDER — ROCURONIUM BROMIDE 10 MG/ML (PF) SYRINGE
PREFILLED_SYRINGE | INTRAVENOUS | Status: DC | PRN
  Administered 2020-10-30: 60 mg via INTRAVENOUS

## 2020-10-30 MED ORDER — LIDOCAINE 2% (20 MG/ML) 5 ML SYRINGE
INTRAMUSCULAR | Status: DC | PRN
  Administered 2020-10-30: 50 mg via INTRAVENOUS

## 2020-10-30 MED ORDER — SUGAMMADEX SODIUM 200 MG/2ML IV SOLN
INTRAVENOUS | Status: DC | PRN
  Administered 2020-10-30: 300 mg via INTRAVENOUS

## 2020-10-30 SURGICAL SUPPLY — 57 items
AID PSTN UNV HD RSTRNT DISP (MISCELLANEOUS) ×2
BLADE SURG 15 STRL LF DISP TIS (BLADE) IMPLANT
BLADE SURG 15 STRL SS (BLADE)
BURR OVAL 8 FLU 4.0X13 (MISCELLANEOUS) ×3 IMPLANT
BURR OVAL 8 FLU 5.0X13 (MISCELLANEOUS) IMPLANT
CANNULA TWIST IN 8.25X7CM (CANNULA) IMPLANT
COVER WAND RF STERILE (DRAPES) IMPLANT
DECANTER SPIKE VIAL GLASS SM (MISCELLANEOUS) IMPLANT
DISSECTOR  3.8MM X 13CM (MISCELLANEOUS) ×3
DISSECTOR 3.8MM X 13CM (MISCELLANEOUS) ×2 IMPLANT
DISSECTOR 4.0MM X 13CM (MISCELLANEOUS) ×3 IMPLANT
DRAPE IMP U-DRAPE 54X76 (DRAPES) ×3 IMPLANT
DRAPE SHOULDER BEACH CHAIR (DRAPES) ×3 IMPLANT
DRAPE U-SHAPE 47X51 STRL (DRAPES) ×3 IMPLANT
DRSG PAD ABDOMINAL 8X10 ST (GAUZE/BANDAGES/DRESSINGS) ×3 IMPLANT
DURAPREP 26ML APPLICATOR (WOUND CARE) ×3 IMPLANT
ELECT REM PT RETURN 9FT ADLT (ELECTROSURGICAL)
ELECTRODE REM PT RTRN 9FT ADLT (ELECTROSURGICAL) IMPLANT
GAUZE SPONGE 4X4 12PLY STRL (GAUZE/BANDAGES/DRESSINGS) ×3 IMPLANT
GAUZE XEROFORM 1X8 LF (GAUZE/BANDAGES/DRESSINGS) ×3 IMPLANT
GLOVE SRG 8 PF TXTR STRL LF DI (GLOVE) ×4 IMPLANT
GLOVE SURG ENC MOIS LTX SZ7.5 (GLOVE) ×3 IMPLANT
GLOVE SURG ORTHO LTX SZ8 (GLOVE) ×3 IMPLANT
GLOVE SURG UNDER POLY LF SZ8 (GLOVE) ×6
GOWN STRL REUS W/ TWL LRG LVL3 (GOWN DISPOSABLE) ×2 IMPLANT
GOWN STRL REUS W/ TWL XL LVL3 (GOWN DISPOSABLE) ×2 IMPLANT
GOWN STRL REUS W/TWL LRG LVL3 (GOWN DISPOSABLE) ×3
GOWN STRL REUS W/TWL XL LVL3 (GOWN DISPOSABLE) ×3
KIT SHOULDER TRACTION (DRAPES) ×3 IMPLANT
MANIFOLD NEPTUNE II (INSTRUMENTS) ×3 IMPLANT
NDL SAFETY ECLIPSE 18X1.5 (NEEDLE) IMPLANT
NDL SCORPION MULTI FIRE (NEEDLE) IMPLANT
NEEDLE HYPO 18GX1.5 SHARP (NEEDLE)
NEEDLE SCORPION MULTI FIRE (NEEDLE) IMPLANT
NS IRRIG 1000ML POUR BTL (IV SOLUTION) IMPLANT
PACK ARTHROSCOPY DSU (CUSTOM PROCEDURE TRAY) ×3 IMPLANT
PACK BASIN DAY SURGERY FS (CUSTOM PROCEDURE TRAY) ×3 IMPLANT
PAD ORTHO SHOULDER 7X19 LRG (SOFTGOODS) IMPLANT
PENCIL SMOKE EVACUATOR (MISCELLANEOUS) IMPLANT
PORT APPOLLO RF 90DEGREE MULTI (SURGICAL WAND) ×3 IMPLANT
RESTRAINT HEAD UNIVERSAL NS (MISCELLANEOUS) ×3 IMPLANT
SLING ARM FOAM STRAP LRG (SOFTGOODS) IMPLANT
SLING ULTRA II MEDIUM (SOFTGOODS) IMPLANT
SPONGE LAP 4X18 RFD (DISPOSABLE) IMPLANT
SUCTION FRAZIER HANDLE 10FR (MISCELLANEOUS)
SUCTION TUBE FRAZIER 10FR DISP (MISCELLANEOUS) IMPLANT
SUT ETHIBOND 2 OS 4 DA (SUTURE) IMPLANT
SUT ETHILON 3 0 PS 1 (SUTURE) ×3 IMPLANT
SUT VIC AB 2-0 SH 27 (SUTURE)
SUT VIC AB 2-0 SH 27XBRD (SUTURE) IMPLANT
SYR 20ML LL LF (SYRINGE) IMPLANT
SYR BULB EAR ULCER 3OZ GRN STR (SYRINGE) IMPLANT
TAPE HYPAFIX 6X30 (GAUZE/BANDAGES/DRESSINGS) ×3 IMPLANT
TOWEL GREEN STERILE FF (TOWEL DISPOSABLE) ×3 IMPLANT
TUBE CONNECTING 20X1/4 (TUBING) ×3 IMPLANT
TUBING ARTHROSCOPY IRRIG 16FT (MISCELLANEOUS) ×3 IMPLANT
WATER STERILE IRR 1000ML POUR (IV SOLUTION) ×3 IMPLANT

## 2020-10-30 NOTE — Anesthesia Postprocedure Evaluation (Signed)
Anesthesia Post Note  [name]  Procedure(s) Performed: ARTHROSCOPY SHOULDER WITH EXTENSIVE DEBRIDEMENT (Right Shoulder)     Patient location during evaluation: PACU Anesthesia Type: General and Regional Level of consciousness: awake and alert Pain management: pain level controlled Vital Signs Assessment: post-procedure vital signs reviewed and stable Respiratory status: spontaneous breathing, nonlabored ventilation, respiratory function stable and patient connected to nasal cannula oxygen Cardiovascular status: blood pressure returned to baseline and stable Postop Assessment: no apparent nausea or vomiting Anesthetic complications: no   No complications documented.  Last Vitals:  Vitals:   10/30/20 1030 10/30/20 1243  BP: 128/82 132/88  Pulse: 60 63  Resp: 20 16  Temp:  36.5 C  SpO2: 93% 93%    Last Pain:  Vitals:   10/30/20 1243  TempSrc:   PainSc: 0-No pain                 Belen Zwahlen S

## 2020-10-30 NOTE — Anesthesia Procedure Notes (Signed)
Anesthesia Regional Block: Interscalene brachial plexus block   Pre-Anesthetic Checklist: ,, timeout performed, Correct Patient, Correct Site, Correct Laterality, Correct Procedure, Correct Position, site marked, Risks and benefits discussed,  Surgical consent,  Pre-op evaluation,  At surgeon's request and post-op pain management  Laterality: Right  Prep: chloraprep       Needles:  Injection technique: Single-shot  Needle Type: Echogenic Stimulator Needle     Needle Length: 5cm  Needle Gauge: 22     Additional Needles:   Procedures:, nerve stimulator,,,,,,,   Nerve Stimulator or Paresthesia:  Response: biceps flexion, 0.45 mA,   Additional Responses:   Narrative:  Start time: 10/30/2020 7:47 AM End time: 10/30/2020 7:52 AM Injection made incrementally with aspirations every 5 mL.  Performed by: Personally  Anesthesiologist: Albertha Ghee, MD  Additional Notes: Functioning IV was confirmed and monitors were applied.  A 55mm 22ga Arrow echogenic stimulator needle was used. Sterile prep and drape,hand hygiene and sterile gloves were used.  Negative aspiration and negative test dose prior to incremental administration of local anesthetic. The patient tolerated the procedure well.  Ultrasound guidance: relevent anatomy identified, needle position confirmed, local anesthetic spread visualized around nerve(s), vascular puncture avoided.  Image printed for medical record.

## 2020-10-30 NOTE — Discharge Instructions (Signed)
   Regional Anesthesia Blocks  1. Numbness or the inability to move the "blocked" extremity may last from 3-48 hours after placement. The length of time depends on the medication injected and your individual response to the medication. If the numbness is not going away after 48 hours, call your surgeon.  2. The extremity that is blocked will need to be protected until the numbness is gone and the  Strength has returned. Because you cannot feel it, you will need to take extra care to avoid injury. Because it may be weak, you may have difficulty moving it or using it. You may not know what position it is in without looking at it while the block is in effect.  3. For blocks in the legs and feet, returning to weight bearing and walking needs to be done carefully. You will need to wait until the numbness is entirely gone and the strength has returned. You should be able to move your leg and foot normally before you try and bear weight or walk. You will need someone to be with you when you first try to ensure you do not fall and possibly risk injury.  4. Bruising and tenderness at the needle site are common side effects and will resolve in a few days.  5. Persistent numbness or new problems with movement should be communicated to the surgeon or the [facility] [phone] [place] [phone]).     Post Anesthesia Home Care Instructions  Activity: Get plenty of rest for the remainder of the day. A responsible individual must stay with you for 24 hours following the procedure.  For the next 24 hours, DO NOT: -Drive a car -Paediatric nurse -Drink alcoholic beverages -Take any medication unless instructed by your physician -Make any legal decisions or sign important papers.  Meals: Start with liquid foods such as gelatin or soup. Progress to regular foods as tolerated. Avoid greasy, spicy, heavy foods. If nausea and/or vomiting occur, drink only clear liquids  until the nausea and/or vomiting subsides. Call your physician if vomiting continues.  Special Instructions/Symptoms: Your throat may feel dry or sore from the anesthesia or the breathing tube placed in your throat during surgery. If this causes discomfort, gargle with warm salt water. The discomfort should disappear within 24 hours.  If you had a scopolamine patch placed behind your ear for the management of post- operative nausea and/or vomiting:  1. The medication in the patch is effective for 72 hours, after which it should be removed.  Wrap patch in a tissue and discard in the trash. Wash hands thoroughly with soap and water. 2. You may remove the patch earlier than 72 hours if you experience unpleasant side effects which may include dry mouth, dizziness or visual disturbances. 3. Avoid touching the patch. Wash your hands with soap and water after contact with the patch.        Expect right shoulder pain and swelling -ice your shoulder periodically throughout the next few days. You can come in and out of your sling as comfort allows. You should try to work on gentle range of motion daily using your right shoulder. You can remove your dressings in 24 to 48 hours and get your 2 incisions wet in the shower daily. After each shower, place Band-Aids over the incisions daily. Do expect some drainage of fluid and blood-tinged fluid from the shoulder over the next few days.

## 2020-10-30 NOTE — Anesthesia Preprocedure Evaluation (Addendum)
Anesthesia Evaluation  Patient identified by MRN, date of birth, ID band Patient awake    Reviewed: Allergy & Precautions, H&P , NPO status , Patient's Chart, lab work & pertinent test results  Airway Mallampati: II   Neck ROM: full    Dental   Pulmonary asthma , sleep apnea , former smoker,    breath sounds clear to auscultation       Cardiovascular hypertension,  Rhythm:regular Rate:Normal     Neuro/Psych  Neuromuscular disease    GI/Hepatic GERD  ,  Endo/Other    Renal/GU Renal InsufficiencyRenal diseasestones     Musculoskeletal  (+) Arthritis ,   Abdominal   Peds  Hematology   Anesthesia Other Findings   Reproductive/Obstetrics                            Anesthesia Physical Anesthesia Plan  ASA: III  Anesthesia Plan: General   Post-op Pain Management:  Regional for Post-op pain   Induction: Intravenous  PONV Risk Score and Plan: 2 and Ondansetron, Dexamethasone, Midazolam and Treatment may vary due to age or medical condition  Airway Management Planned: Oral ETT  Additional Equipment:   Intra-op Plan:   Post-operative Plan: Extubation in OR  Informed Consent: I have reviewed the patients History and Physical, chart, labs and discussed the procedure including the risks, benefits and alternatives for the proposed anesthesia with the patient or authorized representative who has indicated his/her understanding and acceptance.     Dental advisory given  Plan Discussed with: CRNA, Anesthesiologist and Surgeon  Anesthesia Plan Comments:         Anesthesia Quick Evaluation

## 2020-10-30 NOTE — Brief Op Note (Signed)
10/30/2020  9:30 AM  PATIENT:  [name]  65 y.o. male  PRE-OPERATIVE DIAGNOSIS:  right shoulder complete rotator cuff tear  POST-OPERATIVE DIAGNOSIS:  right shoulder complete rotator cuff tear  PROCEDURE:  Procedure(s): ARTHROSCOPY SHOULDER WITH EXTENSIVE DEBRIDEMENT (Right)  SURGEON:  Surgeon(s) and Role:    Mcarthur Rossetti, MD - Primary  PHYSICIAN ASSISTANT:  Benita Stabile, PA-C  ANESTHESIA:   regional and general  EBL:  10 mL   COUNTS:  YES  DICTATION: .Other Dictation: Dictation Number [id number]  PLAN OF CARE: Discharge to home after PACU  PATIENT DISPOSITION:  PACU - hemodynamically stable.   Delay start of Pharmacological VTE agent (>24hrs) due to surgical blood loss or risk of bleeding: no

## 2020-10-30 NOTE — H&P (Signed)
[name] is an 65 y.o. male.   Chief Complaint: chronic right shoulder pain and weakness HPI:   The patient is a 65 yo male well-known to me.  I have seen him for many years for his right shoulder due to pain and weakness.  A MRI of the shoulder does show tearing of the rotator cuff.  We have tried conservative treatment for many years now and at this point, his pain and weakness is detrimentally affecting his quality of life and activities day living.  He has had significant medical issues that have kept him out of the surgery.  At this point those issues have been stabilized and he does wish to proceed with arthroscopic intervention with his right shoulder in an effort to decrease his pain and improve the shoulder mobility.  He understands that he would likely not be able to have a full rotator cuff repair, but a subacromial compression with debridement can be significantly helpful for him.  Past Medical History:  Diagnosis Date  . Back pain   . CKD (chronic kidney disease), stage III ([place])   . CVA (cerebral infarction)   . Diastolic dysfunction   . ED (erectile dysfunction)   . GERD (gastroesophageal reflux disease)   . Hypercalcemia   . Hyperlipidemia   . Hypertension   . Insomnia   . Long-term use of high-risk medication   . Nephrocalcinosis   . Nephrolithiasis   . Obesity   . Osteopenia   . Pancreatitis   . PE (pulmonary embolism)   . Proteinuria   . Sarcoidosis   . Smoker     Past Surgical History:  Procedure Laterality Date  . ABDOMINAL EXPLORATION SURGERY    . BACK SURGERY    . CHOLECYSTECTOMY    . SHOULDER SURGERY    . TRACHEOSTOMY     closed    Family History  Problem Relation Age of Onset  . Hypertension Father   . CVA Father   . Lung cancer Father   . Alzheimer's disease Mother   . Hypertension Sister    Social History:  reports that he has quit smoking. His smoking use included cigarettes. He has never used smokeless tobacco. He reports that he does  not drink alcohol and does not use drugs.  Allergies:  Allergies  Allergen Reactions  . Codeine Nausea Only  . Hydromorphone Other (See Comments)    hallucinations     Medications Prior to Admission  Medication Sig Dispense Refill  . atorvastatin (LIPITOR) 20 MG tablet Take 20 mg by mouth daily.    Marland Kitchen diltiazem (CARDIZEM CD) 360 MG 24 hr capsule Take 360 mg by mouth daily.    . hydrALAZINE (APRESOLINE) 50 MG tablet Take 50 mg by mouth 2 (two) times a day.    . labetalol (NORMODYNE) 300 MG tablet Take 300 mg by mouth 3 (three) times daily.    Marland Kitchen losartan (COZAAR) 100 MG tablet Take 100 mg by mouth daily.    Marland Kitchen MELATONIN PO Take 12.5 mg by mouth at bedtime.    . predniSONE (DELTASONE) 5 MG tablet Take 5 mg by mouth daily. Reported on 11/02/2015    . tamsulosin (FLOMAX) 0.4 MG CAPS capsule Take 0.4 mg by mouth daily.    . traZODone (DESYREL) 100 MG tablet Take 100 mg by mouth at bedtime.    Marland Kitchen albuterol (PROVENTIL HFA) 108 (90 Base) MCG/ACT inhaler Inhale 2 puffs into the lungs every 4 (four) hours as needed for wheezing or  shortness of breath. Needs office visit for any refills. 1 Inhaler 0  . ALPRAZolam (XANAX) 0.5 MG tablet Take 0.5 mg by mouth 2 (two) times daily as needed.    Marland Kitchen aspirin 81 MG tablet Take 1 tablet (81 mg total) by mouth daily.    . fluticasone (FLONASE) 50 MCG/ACT nasal spray Place 2 sprays into both nostrils daily as needed for allergies or rhinitis.    Marland Kitchen oxyCODONE (ROXICODONE) 5 MG immediate release tablet Take 1-2 tablets (5-10 mg total) by mouth every 6 (six) hours as needed for severe pain. 30 tablet 0    No results found for this or any previous visit (from the past 48 hour(s)). No results found.  Review of Systems  Blood pressure (!) 171/110, pulse 64, temperature 97.9 F (36.6 C), temperature source Oral, resp. rate 16, height 6' (1.829 m), weight 108 kg, SpO2 98 %. Physical Exam Vitals reviewed.  Constitutional:      Appearance: Normal appearance.  HENT:      Head: Normocephalic and atraumatic.  Eyes:     Pupils: Pupils are equal, round, and reactive to light.  Cardiovascular:     Rate and Rhythm: Normal rate.     Pulses: Normal pulses.  Pulmonary:     Effort: Pulmonary effort is normal.  Abdominal:     Palpations: Abdomen is soft.  Musculoskeletal:     Right shoulder: Tenderness and bony tenderness present. Decreased range of motion. Decreased strength.     Cervical back: Normal range of motion.  Neurological:     Mental Status: He is alert and oriented to person, place, and time.  Psychiatric:        Behavior: Behavior normal.      Assessment/Plan Right shoulder with chronic pain, impingement and history of rotator cuff tear  Plan is to proceed to surgery today for right shoulder arthroscopy with extensive debridement and subacromial decompression.  We will address the rotator cuff as necessary and needed depending on the nature of his tendon fibers and the nature of the tear of the cuff.  The risk and benefits of surgery been explained in detail.  Mcarthur Rossetti, MD 10/30/2020, 7:17 AM

## 2020-10-30 NOTE — Transfer of Care (Signed)
Immediate Anesthesia Transfer of Care Note  [name]  Procedure(s) Performed: ARTHROSCOPY SHOULDER WITH EXTENSIVE DEBRIDEMENT (Right Shoulder)  Patient Location: PACU  Anesthesia Type:GA combined with regional for post-op pain  Level of Consciousness: awake and alert   Airway & Oxygen Therapy: Patient Spontanous Breathing and Patient connected to face mask oxygen  Post-op Assessment: Report given to RN and Post -op Vital signs reviewed and stable  Post vital signs: Reviewed and stable  Last Vitals:  Vitals Value Taken Time  BP 137/79 10/30/20 0942  Temp    Pulse 64 10/30/20 0944  Resp 24 10/30/20 0944  SpO2 94 % 10/30/20 0944  Vitals shown include unvalidated device data.  Last Pain:  Vitals:   10/30/20 0703  TempSrc: Oral  PainSc: 4       Patients Stated Pain Goal: 5 (92/33/00 7622)  Complications: No complications documented.

## 2020-10-30 NOTE — Progress Notes (Signed)
Assisted Dr. Hodierne with right, ultrasound guided, interscalene  block. Side rails up, monitors on throughout procedure. See vital signs in flow sheet. Tolerated Procedure well. 

## 2020-10-30 NOTE — Op Note (Signed)
NAMETALAL, FRITCHMAN MEDICAL RECORD NO: [id number] ACCOUNT NO: [id number] DATE OF BIRTH: [birth year] FACILITY: [facility] LOCATION: [facility] PHYSICIAN: Lind Guest. Ninfa Linden, MD  Operative Report   DATE OF PROCEDURE: 10/30/2020  PREOPERATIVE DIAGNOSIS:  Right shoulder with rotator cuff arthropathy and retracted rotator cuff tear and extensive inflamed tissue.  POSTOPERATIVE DIAGNOSIS:  Right shoulder with rotator cuff arthropathy and retracted rotator cuff tear and extensive inflamed tissue.  PROCEDURE PERFORMED:  Right shoulder arthroscopy with extensive debridement.  FINDINGS:  Large retracted rotator cuff tear with moderate glenohumeral arthritis and retained sutures from previous attempted rotator cuff repair.  SURGEON:  Lind Guest. Ninfa Linden, MD  ASSISTANT:  Erskine Emery, PA-C.  ANESTHESIA: 1.  Right regional shoulder block. 2.  General.  ESTIMATED BLOOD LOSS:  Minimal.  COMPLICATIONS:  None.  INDICATIONS:  The patient is a 65 year old gentleman with chronic right shoulder pain and weakness.  He had a rotator cuff repair years ago arthroscopically.  He then developed worsening weakness in that shoulder with decreased range of motion and pain.   An MRI in November of 2020 showed a full thickness retracted rotator cuff tear to almost the level of the glenoid.  There was significant muscle atrophy as well.  After continued pain in his shoulder, he is wishing for an arthroscopic intervention.  We  talked about the possibility of sending him to someone for shoulder replacement.  He was hoping to have at least some minimal surgery in terms of an arthroscopic intervention to debride the shoulder and helping him from a pain standpoint.  He is a larger  individual and his ability to get the therapy, I think is limited.  He has had some other medical issues and occasionally has some cognitive defects after general anesthesia.  We talked in length about the surgery and I did obtain  clearance from his  primary care physician as well.  DESCRIPTION OF PROCEDURE:  After informed consent was obtained, appropriate right shoulder was marked.  Anesthesia obtained, a regional shoulder block in the holding room.  He was then brought to the operating room and placed supine on the operating  table.  General anesthesia was then obtained, he was then sat up in a beach chair position with appropriate positioning of the head and neck and padding down on the operative left arm.  His right shoulder was prepped and draped in DuraPrep and sterile  drapes including a sterile stockinette was placed in a fishing pole traction device using 15 pounds of traction, 45 degrees of forward flexion and neutral rotation.  A timeout was called to identify correct patient, correct right shoulder.  I then made a  posterolateral arthroscopy port and then entered the glenohumeral joint.  We could see right away there was a full thickness retracted rotator cuff tear.  There was one area in the central aspect of the glenoid with full cartilage loss.  There was  abundant inflamed tissue of the shoulder.  We went all the way up to the subacromial space and made a separate lateral portal.  We could see that there was retained sutures x2 from his previous rotator cuff repair that he had pulled away.  We were able  to place a grasper within the shoulder and remove both sutures.  I then placed a soft tissue ablation wand and carried out an extensive debridement in the subacromial outlet in the glenohumeral joint area.  I used arthroscopic shaver as well to debride  extensive inflamed tissue.  Again, the rotator  cuff was retracted and not repairable.  There was still adequate space in the subacromial and the humeral head was not significantly high riding.  There was still cartilage on the humeral head.  I then  removed all instrumentation from the shoulder and closed the portal sites with interrupted nylon suture.  Xeroform,  well-padded sterile dressing was applied.  Shoulder was placed in a standard sling.  He was awakened, extubated, and taken to recovery  room.  All final counts were correct.  There were no complications noted.  Postoperatively, we will eventually get him into physical therapy to hopefully rehabilitate the shoulder.  If this does not help him, there will be consideration for a shoulder  replacement.   PUS D: 10/30/2020 9:27:46 am T: 10/30/2020 12:47:00 pm  JOB: [id number]/ [id number]

## 2020-10-30 NOTE — Anesthesia Procedure Notes (Signed)
Procedure Name: Intubation Date/Time: 10/30/2020 8:47 AM Performed by: Genelle Bal, CRNA Pre-anesthesia Checklist: Patient identified, Emergency Drugs available, Suction available and Patient being monitored Patient Re-evaluated:Patient Re-evaluated prior to induction Oxygen Delivery Method: Circle system utilized Preoxygenation: Pre-oxygenation with 100% oxygen Induction Type: IV induction Ventilation: Mask ventilation without difficulty and Oral airway inserted - appropriate to patient size Laryngoscope Size: Sabra Heck and 2 Grade View: Grade I Tube type: Oral Tube size: 7.5 mm Number of attempts: 1 Airway Equipment and Method: Stylet and Oral airway Placement Confirmation: ETT inserted through vocal cords under direct vision,  positive ETCO2 and breath sounds checked- equal and bilateral Secured at: 22 cm Tube secured with: Tape Dental Injury: Teeth and Oropharynx as per pre-operative assessment

## 2020-10-31 ENCOUNTER — Encounter (HOSPITAL_BASED_OUTPATIENT_CLINIC_OR_DEPARTMENT_OTHER): Payer: Self-pay | Admitting: Orthopaedic Surgery

## 2020-11-05 ENCOUNTER — Ambulatory Visit (INDEPENDENT_AMBULATORY_CARE_PROVIDER_SITE_OTHER): Payer: BC Managed Care – PPO | Admitting: Orthopaedic Surgery

## 2020-11-05 ENCOUNTER — Other Ambulatory Visit: Payer: Self-pay

## 2020-11-05 ENCOUNTER — Encounter: Payer: Self-pay | Admitting: Orthopaedic Surgery

## 2020-11-05 DIAGNOSIS — G8929 Other chronic pain: Secondary | ICD-10-CM

## 2020-11-05 DIAGNOSIS — M25511 Pain in right shoulder: Secondary | ICD-10-CM

## 2020-11-05 DIAGNOSIS — Z9889 Other specified postprocedural states: Secondary | ICD-10-CM

## 2020-11-05 MED ORDER — OXYCODONE HCL 5 MG PO TABS: 5.0000 mg | ORAL_TABLET | Freq: Four times a day (QID) | ORAL | 0 refills | Status: DC | PRN

## 2020-11-05 NOTE — Progress Notes (Signed)
HPI: [name] returns today status post right shoulder arthroscopy with extensive debridement.Marland Kitchen  He underwent right shoulder arthroscopy found to have an unrepairable rotator cuff tear.  He states that the shoulder pain is slightly worse than it was prior to surgery.  Been in sling.  Physical exam: Right shoulder port sites well approximated with nylon sutures no signs of infection.  He has slight area of ecchymosis over the biceps region.  He is able to forward flex to approximately 70 degrees actively.  He has full range of motion of his right elbow.  Impression: Status post right shoulder arthroscopy with extensive debridement Right shoulder complete unrepairable rotator cuff tear  Plan: He will discontinue his sling only wearing it for comfort at this point.  He is encouraged to work on range of motion of the shoulder.  We will send him to physical therapy to work on range of motion, strengthening and to gain a home exercise program.  He will follow up with [place] in 1 month.  Sutures removed.  Questions were encouraged and answered.  Refill on patient's oxycodone was given.

## 2020-11-17 ENCOUNTER — Other Ambulatory Visit: Payer: Self-pay

## 2020-11-17 ENCOUNTER — Ambulatory Visit: Payer: BC Managed Care – PPO | Attending: Orthopaedic Surgery

## 2020-11-17 DIAGNOSIS — M25611 Stiffness of right shoulder, not elsewhere classified: Secondary | ICD-10-CM

## 2020-11-17 DIAGNOSIS — M6281 Muscle weakness (generalized): Secondary | ICD-10-CM

## 2020-11-17 DIAGNOSIS — M25511 Pain in right shoulder: Secondary | ICD-10-CM

## 2020-11-17 DIAGNOSIS — R600 Localized edema: Secondary | ICD-10-CM

## 2020-11-17 DIAGNOSIS — G8929 Other chronic pain: Secondary | ICD-10-CM

## 2020-11-17 DIAGNOSIS — M25612 Stiffness of left shoulder, not elsewhere classified: Secondary | ICD-10-CM

## 2020-11-17 NOTE — Therapy (Signed)
[address] [place], [name], [address] Phone: [phone]   Fax:  [phone]  Physical Therapy Evaluation  Patient Details  Name: [name] MRN: [id number] Date of Birth: [birth year] Referring Provider (PT): Ninfa Linden   Encounter Date: 11/17/2020   PT End of Session - 11/17/20 0930    Visit Number 1    Number of Visits 17    Date for PT Re-Evaluation 01/12/21    PT Start Time 0800    PT Stop Time 0841    PT Time Calculation (min) 41 min    Activity Tolerance Patient tolerated treatment well;Patient limited by pain    Behavior During Therapy [facility] for tasks assessed/performed           Past Medical History:  Diagnosis Date  . Back pain   . CKD (chronic kidney disease), stage III ([place])   . CVA (cerebral infarction)   . Diastolic dysfunction   . ED (erectile dysfunction)   . GERD (gastroesophageal reflux disease)   . Hypercalcemia   . Hyperlipidemia   . Hypertension   . Insomnia   . Long-term use of high-risk medication   . Nephrocalcinosis   . Nephrolithiasis   . Obesity   . Osteopenia   . Pancreatitis   . PE (pulmonary embolism)   . Proteinuria   . Sarcoidosis   . Smoker     Past Surgical History:  Procedure Laterality Date  . ABDOMINAL EXPLORATION SURGERY    . BACK SURGERY    . CHOLECYSTECTOMY    . SHOULDER ARTHROSCOPY Right 10/30/2020   Procedure: ARTHROSCOPY SHOULDER WITH EXTENSIVE DEBRIDEMENT;  Surgeon: Mcarthur Rossetti, MD;  Location: [facility];  Service: Orthopedics;  Laterality: Right;  . SHOULDER SURGERY    . TRACHEOSTOMY     closed    There were no vitals filed for this visit.    Subjective Assessment - 11/17/20 0804    Subjective History of Chronic R shoulder pain. s/p Right shoulder arthroscopy with extensive debridement 10/30/20    Pertinent History Previous R shoulder surgery 3 yrs ago. Previous RTC surgery on L shoulder 4-5 yrs ago. HTN. Back  surgery ~ 2 yrs ago (has had 3 or 4 ).    Patient Stated Goals to get mobility back, decrease pain    Currently in Pain? Yes    Pain Score 6     Pain Location Shoulder    Pain Orientation Right    Pain Descriptors / Indicators Aching   occasional sharp   Pain Type Acute pain;Chronic pain    Pain Onset More than a month ago    Pain Frequency Constant    Aggravating Factors  when using hands to push down to get up ( usually uses hands to help ge tinto standing)              [facility] PT Assessment - 11/17/20 0808      Assessment   Medical Diagnosis s/p Right shoulder arthroscopy with extensive debridement 10/30/20    Referring Provider (PT) Ninfa Linden    Hand Dominance Right    Prior Therapy After past back surgery, could not recall therapy for previous shoulder sugeries/pain      Precautions   Precaution Comments has sling - only using for comfort      Balance Screen   Has the patient fallen in the past 6 months No    Has the patient had a decrease in activity level because  of a fear of falling?  No    Is the patient reluctant to leave their home because of a fear of falling?  No      Home Environment   Additional Comments house. with wife and daughter with 4 grandchildren. 2 levels. 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 Surgical scar healing well at the right shoulder. Some swelling      ROM / Strength   AROM / PROM / Strength AROM;PROM;Strength      AROM   Overall AROM Comments Shoulder: Left Flex 125 A/ 150 P. Left ABD 115 A/130 P. Left ER 60 deg. Right Flex 60 AA/ P 90 deg.  Right passsive only Abd to 10 deg supine, ER to neutral. Limited by pain      Strength   Overall Strength Comments Left shoulder 3+/5 flex/abd. Left elbow and wrist 4/5. Right shoulder 2+/5 limited by pain. Right elbow and wrist 3+/5. Left grip 64#, Right grip 70#                      Objective measurements completed on examination: See above findings.               PT Education - 11/17/20 0843    Education Details PT POCand Initial HEP. Access Code: [email or web address]  Exercises  Ice - 1 x daily - 7 x weekly - 3 sets - 15-20 minutes. hold  Flexion-Extension Shoulder Pendulum with Table Support - 1 x daily - 7 x weekly - 3 sets - 30 seconds hold  Seated Scapular Retraction - 1 x daily - 7 x weekly - 3 sets - 10 reps  Seated Elbow Flexion and Extension AROM - 1 x daily - 7 x weekly - 3 sets - 10 reps    Person(s) Educated Patient    Methods Explanation;Demonstration    Comprehension Verbalized understanding;Returned demonstration;Need further instruction            PT Short Term Goals - 11/17/20 0844      PT SHORT TERM GOAL #1   Title Independent with initial HEP    Time 2    Period Weeks    Status New    Target Date 12/01/20             PT Long Term Goals - 11/17/20 0809      PT LONG TERM GOAL #1   Title Independent with advanced HEP    Time 8    Period Weeks    Status New    Target Date 01/12/21      PT LONG TERM GOAL #2   Title Report </= 2/10 R shoulder pain with ADLs using dominant right side    Time 8    Period Weeks    Status New    Target Date 01/12/21      PT LONG TERM GOAL #3   Title R shoulder AROM symmetrical to left    Time 8    Period Weeks    Status New  Target Date 01/12/21      PT LONG TERM GOAL #4   Title BUE strength 4+/5    Time 8    Period Weeks    Status New    Target Date 01/12/21                  Plan - 11/17/20 0931    Clinical Impression Statement Pt is a 65 yo male with history of chronic R shoulder  pain who presents to physical therapy eval s/p R shoulder arthroscopy with extensive debridement on 10/30/2020. PMH significant for previous R shoulder surgery, L shoulder surgery and back surgeries. He presents with significant muscle guarding, bruising along the right upper arm,  painful and limited PROM of the R shoulder, R shoulder weakness, and some stiffness and weakness of the left shoulder. He is right hand dominant and as a result of these impairments is having diffiuclty with ADLs, caring for grandchildren limited by pain and decreased functional mobility. He will benefit from skilled PT to address the aforementioned issues, work on decreasing pain and improving functional mobility.    Personal Factors and Comorbidities Past/Current Experience;Comorbidity 2;Fitness;Time since onset of injury/illness/exacerbation    Stability/Clinical Decision Making Stable/Uncomplicated    Clinical Decision Making Low    Rehab Potential Good    PT Frequency 2x / week    PT Duration 8 weeks    PT Treatment/Interventions ADLs/Self Care Home Management;Electrical Stimulation;Cryotherapy;Moist Heat;Neuromuscular re-education;Therapeutic exercise;Therapeutic activities;Functional mobility training;Patient/family education;Manual techniques;Energy conservation;Taping;Vasopneumatic Device;Scar mobilization    PT Next Visit Plan Reasses HEP. Shoulder FOTO. Initiate gentle PROM/AAROM within tolerance, emphasis on within pain free ranges. Manual and mdalities as needed.    PT Home Exercise Plan see pt edu    Consulted and Agree with Plan of Care Patient           Patient will benefit from skilled therapeutic intervention in order to improve the following deficits and impairments:  Decreased range of motion,Impaired UE functional use,Increased muscle spasms,Decreased endurance,Pain,Improper body mechanics,Impaired flexibility,Decreased mobility,Decreased strength,Increased edema  Visit Diagnosis: Muscle weakness  (generalized) - Plan: PT plan of care cert/re-cert  Chronic right shoulder pain - Plan: PT plan of care cert/re-cert  Stiffness of right shoulder, not elsewhere classified - Plan: PT plan of care cert/re-cert  Localized edema - Plan: PT plan of care cert/re-cert  Stiffness of left shoulder, not elsewhere classified - Plan: PT plan of care cert/re-cert     Problem List Patient Active Problem List   Diagnosis Date Noted  . Chronic right shoulder pain 06/18/2020  . OSA (obstructive sleep apnea) 05/05/2020  . Hypersomnia with sleep apnea 12/20/2019  . Psychophysiological insomnia 09/18/2019  . Nocturia more than twice per night 09/18/2019  . Renal hypertension 09/18/2019  . Moderate asthma 09/18/2019  . Tobacco abuse 09/18/2019  . Non-restorative sleep 09/18/2019  . Nontraumatic complete tear of right rotator cuff 08/15/2019  . Delirium 03/16/2019  . Stage 3b chronic kidney disease   . CVA (cerebral infarction)   . Diastolic dysfunction   . Pseudarthrosis 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 02/28/2019  . Intervertebral disc disorder with radiculopathy of lumbar region 09/13/2017  . Acute gouty arthritis 03/07/2017  . Arthralgia of left foot 03/07/2017  . Nephrolithiasis   . Fever, unspecified 11/02/2015  . Essential hypertension 02/07/2015  . Pulmonary embolism ([place]) 01/30/2013  . Sarcoidosis 01/30/2013  . Hemoptysis 01/30/2013  . SHOULDER PAIN, LEFT 11/28/2009    Hall Busing , PT, DPT 11/17/2020, 12:26 PM  [facility]  [address] [place], [name], [address] Phone: [phone]   Fax:  [phone]  Name: [name] MRN: [id number] Date of Birth: [birth year]

## 2020-11-17 NOTE — Patient Instructions (Signed)
Access Code: [email or web address] URL: [email or web address] Date: 11/17/2020 Prepared by: Sherlynn Stalls  Exercises Ice - 1 x daily - 7 x weekly - 3 sets - 15-20 minutes. hold Flexion-Extension Shoulder Pendulum with Table Support - 1 x daily - 7 x weekly - 3 sets - 30 seconds hold Seated Scapular Retraction - 1 x daily - 7 x weekly - 3 sets - 10 reps Seated Elbow Flexion and Extension AROM - 1 x daily - 7 x weekly - 3 sets - 10 reps

## 2020-11-18 ENCOUNTER — Other Ambulatory Visit: Payer: Self-pay

## 2020-11-18 ENCOUNTER — Emergency Department (HOSPITAL_COMMUNITY): Payer: BC Managed Care – PPO

## 2020-11-18 ENCOUNTER — Encounter (HOSPITAL_COMMUNITY): Payer: Self-pay

## 2020-11-18 ENCOUNTER — Inpatient Hospital Stay (HOSPITAL_COMMUNITY)
Admission: EM | Admit: 2020-11-18 | Discharge: 2020-11-22 | DRG: 871 | Disposition: A | Discharge status: Home Health | Payer: BC Managed Care – PPO | Attending: Family Medicine | Admitting: Family Medicine

## 2020-11-18 DIAGNOSIS — Z7952 Long term (current) use of systemic steroids: Secondary | ICD-10-CM

## 2020-11-18 DIAGNOSIS — N179 Acute kidney failure, unspecified: Secondary | ICD-10-CM | POA: Diagnosis present

## 2020-11-18 DIAGNOSIS — N1832 Chronic kidney disease, stage 3b: Secondary | ICD-10-CM | POA: Diagnosis present

## 2020-11-18 DIAGNOSIS — R7881 Bacteremia: Principal | ICD-10-CM | POA: Diagnosis present

## 2020-11-18 DIAGNOSIS — G039 Meningitis, unspecified: Secondary | ICD-10-CM

## 2020-11-18 DIAGNOSIS — J9811 Atelectasis: Secondary | ICD-10-CM | POA: Diagnosis present

## 2020-11-18 DIAGNOSIS — K219 Gastro-esophageal reflux disease without esophagitis: Secondary | ICD-10-CM | POA: Diagnosis present

## 2020-11-18 DIAGNOSIS — G629 Polyneuropathy, unspecified: Secondary | ICD-10-CM

## 2020-11-18 DIAGNOSIS — Z8249 Family history of ischemic heart disease and other diseases of the circulatory system: Secondary | ICD-10-CM

## 2020-11-18 DIAGNOSIS — Z6832 Body mass index (BMI) 32.0-32.9, adult: Secondary | ICD-10-CM

## 2020-11-18 DIAGNOSIS — J9600 Acute respiratory failure, unspecified whether with hypoxia or hypercapnia: Secondary | ICD-10-CM | POA: Diagnosis present

## 2020-11-18 DIAGNOSIS — Z885 Allergy status to narcotic agent status: Secondary | ICD-10-CM

## 2020-11-18 DIAGNOSIS — J9602 Acute respiratory failure with hypercapnia: Secondary | ICD-10-CM | POA: Diagnosis present

## 2020-11-18 DIAGNOSIS — Z8673 Personal history of transient ischemic attack (TIA), and cerebral infarction without residual deficits: Secondary | ICD-10-CM

## 2020-11-18 DIAGNOSIS — M8580 Other specified disorders of bone density and structure, unspecified site: Secondary | ICD-10-CM | POA: Diagnosis present

## 2020-11-18 DIAGNOSIS — I130 Hypertensive heart and chronic kidney disease with heart failure and stage 1 through stage 4 chronic kidney disease, or unspecified chronic kidney disease: Secondary | ICD-10-CM | POA: Diagnosis present

## 2020-11-18 DIAGNOSIS — D8689 Sarcoidosis of other sites: Secondary | ICD-10-CM | POA: Diagnosis present

## 2020-11-18 DIAGNOSIS — R911 Solitary pulmonary nodule: Secondary | ICD-10-CM | POA: Diagnosis present

## 2020-11-18 DIAGNOSIS — H5789 Other specified disorders of eye and adnexa: Secondary | ICD-10-CM | POA: Diagnosis present

## 2020-11-18 DIAGNOSIS — R4182 Altered mental status, unspecified: Secondary | ICD-10-CM

## 2020-11-18 DIAGNOSIS — B9689 Other specified bacterial agents as the cause of diseases classified elsewhere: Secondary | ICD-10-CM | POA: Diagnosis present

## 2020-11-18 DIAGNOSIS — I248 Other forms of acute ischemic heart disease: Secondary | ICD-10-CM | POA: Diagnosis present

## 2020-11-18 DIAGNOSIS — Z79899 Other long term (current) drug therapy: Secondary | ICD-10-CM

## 2020-11-18 DIAGNOSIS — Z820 Family history of epilepsy and other diseases of the nervous system: Secondary | ICD-10-CM

## 2020-11-18 DIAGNOSIS — G9340 Encephalopathy, unspecified: Secondary | ICD-10-CM | POA: Diagnosis present

## 2020-11-18 DIAGNOSIS — I10 Essential (primary) hypertension: Secondary | ICD-10-CM | POA: Diagnosis present

## 2020-11-18 DIAGNOSIS — G0490 Encephalitis and encephalomyelitis, unspecified: Secondary | ICD-10-CM | POA: Diagnosis not present

## 2020-11-18 DIAGNOSIS — R29810 Facial weakness: Secondary | ICD-10-CM | POA: Diagnosis present

## 2020-11-18 DIAGNOSIS — Z86711 Personal history of pulmonary embolism: Secondary | ICD-10-CM

## 2020-11-18 DIAGNOSIS — Z720 Tobacco use: Secondary | ICD-10-CM | POA: Diagnosis present

## 2020-11-18 DIAGNOSIS — R319 Hematuria, unspecified: Secondary | ICD-10-CM | POA: Diagnosis present

## 2020-11-18 DIAGNOSIS — N1831 Chronic kidney disease, stage 3a: Secondary | ICD-10-CM | POA: Diagnosis present

## 2020-11-18 DIAGNOSIS — I5031 Acute diastolic (congestive) heart failure: Secondary | ICD-10-CM | POA: Diagnosis present

## 2020-11-18 DIAGNOSIS — B9561 Methicillin susceptible Staphylococcus aureus infection as the cause of diseases classified elsewhere: Secondary | ICD-10-CM

## 2020-11-18 DIAGNOSIS — G4736 Sleep related hypoventilation in conditions classified elsewhere: Secondary | ICD-10-CM | POA: Diagnosis present

## 2020-11-18 DIAGNOSIS — I509 Heart failure, unspecified: Secondary | ICD-10-CM | POA: Diagnosis present

## 2020-11-18 DIAGNOSIS — F40240 Claustrophobia: Secondary | ICD-10-CM | POA: Diagnosis present

## 2020-11-18 DIAGNOSIS — D869 Sarcoidosis, unspecified: Secondary | ICD-10-CM | POA: Diagnosis present

## 2020-11-18 DIAGNOSIS — M7989 Other specified soft tissue disorders: Secondary | ICD-10-CM | POA: Diagnosis present

## 2020-11-18 DIAGNOSIS — R4701 Aphasia: Secondary | ICD-10-CM | POA: Diagnosis present

## 2020-11-18 DIAGNOSIS — Z87891 Personal history of nicotine dependence: Secondary | ICD-10-CM

## 2020-11-18 DIAGNOSIS — G6289 Other specified polyneuropathies: Secondary | ICD-10-CM | POA: Diagnosis present

## 2020-11-18 DIAGNOSIS — E860 Dehydration: Secondary | ICD-10-CM | POA: Diagnosis present

## 2020-11-18 DIAGNOSIS — E785 Hyperlipidemia, unspecified: Secondary | ICD-10-CM | POA: Diagnosis present

## 2020-11-18 DIAGNOSIS — Z801 Family history of malignant neoplasm of trachea, bronchus and lung: Secondary | ICD-10-CM

## 2020-11-18 DIAGNOSIS — R0902 Hypoxemia: Secondary | ICD-10-CM

## 2020-11-18 DIAGNOSIS — E669 Obesity, unspecified: Secondary | ICD-10-CM | POA: Diagnosis present

## 2020-11-18 DIAGNOSIS — Z823 Family history of stroke: Secondary | ICD-10-CM

## 2020-11-18 DIAGNOSIS — G4733 Obstructive sleep apnea (adult) (pediatric): Secondary | ICD-10-CM | POA: Diagnosis present

## 2020-11-18 DIAGNOSIS — Z7982 Long term (current) use of aspirin: Secondary | ICD-10-CM

## 2020-11-18 DIAGNOSIS — R7401 Elevation of levels of liver transaminase levels: Secondary | ICD-10-CM | POA: Diagnosis present

## 2020-11-18 DIAGNOSIS — J9601 Acute respiratory failure with hypoxia: Secondary | ICD-10-CM | POA: Diagnosis present

## 2020-11-18 DIAGNOSIS — G9341 Metabolic encephalopathy: Secondary | ICD-10-CM | POA: Diagnosis present

## 2020-11-18 DIAGNOSIS — Z20822 Contact with and (suspected) exposure to covid-19: Secondary | ICD-10-CM | POA: Diagnosis present

## 2020-11-18 LAB — COMPREHENSIVE METABOLIC PANEL
ALT: 74 U/L — ABNORMAL HIGH (ref 0–44)
AST: 63 U/L — ABNORMAL HIGH (ref 15–41)
Albumin: 3.4 g/dL — ABNORMAL LOW (ref 3.5–5.0)
Alkaline Phosphatase: 105 U/L (ref 38–126)
Anion gap: 8 (ref 5–15)
BUN: 14 mg/dL (ref 8–23)
CO2: 31 mmol/L (ref 22–32)
Calcium: 8.8 mg/dL — ABNORMAL LOW (ref 8.9–10.3)
Chloride: 98 mmol/L (ref 98–111)
Creatinine, Ser: 2.31 mg/dL — ABNORMAL HIGH (ref 0.61–1.24)
GFR, Estimated: 31 mL/min — ABNORMAL LOW (ref >60)
Glucose, Bld: 84 mg/dL (ref 70–99)
Potassium: 4.2 mmol/L (ref 3.5–5.1)
Sodium: 137 mmol/L (ref 135–145)
Total Bilirubin: 1.1 mg/dL (ref 0.3–1.2)
Total Protein: 6.3 g/dL — ABNORMAL LOW (ref 6.5–8.1)

## 2020-11-18 LAB — RESP PANEL BY RT-PCR (FLU A&B, COVID) ARPGX2
Influenza A by PCR: NEGATIVE
Influenza B by PCR: NEGATIVE
SARS Coronavirus 2 by RT PCR: NEGATIVE

## 2020-11-18 LAB — CBC WITH DIFFERENTIAL/PLATELET
Abs Immature Granulocytes: 0.02 10*3/uL (ref 0.00–0.07)
Basophils Absolute: 0.1 10*3/uL (ref 0.0–0.1)
Basophils Relative: 1 %
Eosinophils Absolute: 0.2 10*3/uL (ref 0.0–0.5)
Eosinophils Relative: 3 %
HCT: 39.4 % (ref 39.0–52.0)
Hemoglobin: 13 g/dL (ref 13.0–17.0)
Immature Granulocytes: 0 %
Lymphocytes Relative: 15 %
Lymphs Abs: 1.2 10*3/uL (ref 0.7–4.0)
MCH: 31 pg (ref 26.0–34.0)
MCHC: 33 g/dL (ref 30.0–36.0)
MCV: 93.8 fL (ref 80.0–100.0)
Monocytes Absolute: 0.7 10*3/uL (ref 0.1–1.0)
Monocytes Relative: 8 %
Neutro Abs: 5.8 10*3/uL (ref 1.7–7.7)
Neutrophils Relative %: 73 %
Platelets: 244 10*3/uL (ref 150–400)
RBC: 4.2 MIL/uL — ABNORMAL LOW (ref 4.22–5.81)
RDW: 14.4 % (ref 11.5–15.5)
WBC: 8 10*3/uL (ref 4.0–10.5)
nRBC: 0 % (ref 0.0–0.2)

## 2020-11-18 LAB — I-STAT VENOUS BLOOD GAS, ED
Acid-Base Excess: 4 mmol/L — ABNORMAL HIGH (ref 0.0–2.0)
Bicarbonate: 32.8 mmol/L — ABNORMAL HIGH (ref 20.0–28.0)
Calcium, Ion: 1.14 mmol/L — ABNORMAL LOW (ref 1.15–1.40)
HCT: 40 % (ref 39.0–52.0)
Hemoglobin: 13.6 g/dL (ref 13.0–17.0)
O2 Saturation: 59 %
Patient temperature: 101.3
Potassium: 4.2 mmol/L (ref 3.5–5.1)
Sodium: 138 mmol/L (ref 135–145)
TCO2: 35 mmol/L — ABNORMAL HIGH (ref 22–32)
pCO2, Ven: 71.6 mmHg (ref 44.0–60.0)
pH, Ven: 7.277 (ref 7.250–7.430)
pO2, Ven: 39 mmHg (ref 32.0–45.0)

## 2020-11-18 LAB — PROTIME-INR
INR: 1.2 (ref 0.8–1.2)
Prothrombin Time: 14.3 seconds (ref 11.4–15.2)

## 2020-11-18 LAB — LACTIC ACID, PLASMA: Lactic Acid, Venous: 1 mmol/L (ref 0.5–1.9)

## 2020-11-18 LAB — I-STAT CHEM 8, ED
BUN: 16 mg/dL (ref 8–23)
Calcium, Ion: 1.16 mmol/L (ref 1.15–1.40)
Chloride: 97 mmol/L — ABNORMAL LOW (ref 98–111)
Creatinine, Ser: 2.2 mg/dL — ABNORMAL HIGH (ref 0.61–1.24)
Glucose, Bld: 82 mg/dL (ref 70–99)
HCT: 40 % (ref 39.0–52.0)
Hemoglobin: 13.6 g/dL (ref 13.0–17.0)
Potassium: 4.2 mmol/L (ref 3.5–5.1)
Sodium: 138 mmol/L (ref 135–145)
TCO2: 31 mmol/L (ref 22–32)

## 2020-11-18 LAB — TROPONIN I (HIGH SENSITIVITY): Troponin I (High Sensitivity): 178 ng/L (ref <18)

## 2020-11-18 LAB — BRAIN NATRIURETIC PEPTIDE: B Natriuretic Peptide: 1079 pg/mL — ABNORMAL HIGH (ref 0.0–100.0)

## 2020-11-18 LAB — APTT: aPTT: 34 seconds (ref 24–36)

## 2020-11-18 MED ORDER — VANCOMYCIN HCL 2000 MG/400ML IV SOLN
2000.0000 mg | Freq: Once | INTRAVENOUS | Status: AC
  Administered 2020-11-18: 2000 mg via INTRAVENOUS
  Filled 2020-11-18: qty 400

## 2020-11-18 MED ORDER — VANCOMYCIN HCL 750 MG/150ML IV SOLN
750.0000 mg | Freq: Two times a day (BID) | INTRAVENOUS | Status: DC
  Administered 2020-11-19 – 2020-11-20 (×4): 750 mg via INTRAVENOUS
  Filled 2020-11-18 (×5): qty 150

## 2020-11-18 MED ORDER — IOHEXOL 350 MG/ML SOLN
60.0000 mL | Freq: Once | INTRAVENOUS | Status: AC | PRN
  Administered 2020-11-18: 60 mL via INTRAVENOUS

## 2020-11-18 MED ORDER — THIAMINE HCL 100 MG/ML IJ SOLN
100.0000 mg | Freq: Every day | INTRAMUSCULAR | Status: DC
  Administered 2020-11-18 – 2020-11-21 (×2): 100 mg via INTRAVENOUS
  Filled 2020-11-18 (×3): qty 2

## 2020-11-18 MED ORDER — SODIUM CHLORIDE 0.9 % IV SOLN
2.0000 g | Freq: Once | INTRAVENOUS | Status: AC
  Administered 2020-11-19: 2 g via INTRAVENOUS
  Filled 2020-11-18: qty 2

## 2020-11-18 MED ORDER — ACETAMINOPHEN 650 MG RE SUPP
650.0000 mg | Freq: Once | RECTAL | Status: AC
  Administered 2020-11-18: 650 mg via RECTAL
  Filled 2020-11-18: qty 1

## 2020-11-18 MED ORDER — HEPARIN BOLUS VIA INFUSION
6000.0000 [IU] | Freq: Once | INTRAVENOUS | Status: AC
  Administered 2020-11-19: 6000 [IU] via INTRAVENOUS
  Filled 2020-11-18: qty 6000

## 2020-11-18 MED ORDER — LACTATED RINGERS IV BOLUS (SEPSIS)
1000.0000 mL | Freq: Once | INTRAVENOUS | Status: AC
  Administered 2020-11-18: 1000 mL via INTRAVENOUS

## 2020-11-18 MED ORDER — SODIUM CHLORIDE 0.9 % IV SOLN
2.0000 g | Freq: Two times a day (BID) | INTRAVENOUS | Status: DC
  Administered 2020-11-19 (×2): 2 g via INTRAVENOUS
  Filled 2020-11-18 (×4): qty 2

## 2020-11-18 MED ORDER — HEPARIN (PORCINE) 25000 UT/250ML-% IV SOLN
1400.0000 [IU]/h | INTRAVENOUS | Status: DC
  Administered 2020-11-19: 1400 [IU]/h via INTRAVENOUS
  Filled 2020-11-18: qty 250

## 2020-11-18 MED ORDER — THIAMINE HCL 100 MG PO TABS
100.0000 mg | ORAL_TABLET | Freq: Every day | ORAL | Status: DC
  Administered 2020-11-19 – 2020-11-22 (×3): 100 mg via ORAL
  Filled 2020-11-18 (×4): qty 1

## 2020-11-18 MED ORDER — SODIUM CHLORIDE 0.9 % IV SOLN
2.0000 g | Freq: Four times a day (QID) | INTRAVENOUS | Status: DC
  Administered 2020-11-18 – 2020-11-20 (×4): 2 g via INTRAVENOUS
  Filled 2020-11-18: qty 2
  Filled 2020-11-18 (×7): qty 2000
  Filled 2020-11-18: qty 2
  Filled 2020-11-18 (×2): qty 2000

## 2020-11-18 NOTE — ED Notes (Signed)
[name], wife, [phone] would like an update when available

## 2020-11-18 NOTE — Progress Notes (Signed)
Pharmacy Antibiotic Note  [name] is a 65 y.o. male admitted on 11/18/2020 with questionable sepsis. Neuro ruling out meningitis. Pharmacy has been consulted for cefepime and vancomycin dosing.  WBC wnl, Tm 101.3. SCr 2.2.   Plan: -Cefepime 2 gm IV Q 12 hours -Vancomycin 2 gm IV load followed by vancomycin 750 mg IV Q 12 hours per traditional nomogram -Ampicillin 2 gm IV Q 6 hours for listeria coverage  -Monitor CBC, renal fx, cultures and clinical progress   Height: 6' (182.9 cm) Weight: 108 kg (238 lb 1.6 oz) IBW/kg (Calculated) : 77.6  Temp (24hrs), Avg:101.3 F (38.5 C), Min:101.3 F (38.5 C), Max:101.3 F (38.5 C)  No results for input(s): WBC, CREATININE, LATICACIDVEN, VANCOTROUGH, VANCOPEAK, VANCORANDOM, GENTTROUGH, GENTPEAK, GENTRANDOM, TOBRATROUGH, TOBRAPEAK, TOBRARND, AMIKACINPEAK, AMIKACINTROU, AMIKACIN in the last 168 hours.  CrCl cannot be calculated (Patient's most recent lab result is older than the maximum 21 days allowed.).    Allergies  Allergen Reactions  . Codeine Nausea Only  . Hydromorphone Other (See Comments)    hallucinations     Antimicrobials this admission: Cefepime 3/15 >>  Vanc 3/15 >>  Ampicillin 3/15 >>   Dose adjustments this admission:  Microbiology results: 3/15 BCx:  3/15 UCx:   3/15 CSF >>   Thank you for allowing pharmacy to be a part of this patient's care.  Albertina Parr, PharmD., BCPS, BCCCP Clinical Pharmacist Please refer to [facility] for unit-specific pharmacist

## 2020-11-18 NOTE — ED Provider Notes (Signed)
[facility] EMERGENCY DEPARTMENT Provider Note   CSN: [id number] Arrival date & time: 11/18/20  1840     History Chief Complaint  Patient presents with   Altered Mental Status    Normally a/o times 4, ambulatory, cares for self and drove, pt was normal at 0900, at 1630 pt was confused per daughter    [name] is a 65 y.o. male.  HPI Patient is a 65 year old male with a history of CVA, CKD stage III, diastolic dysfunction, PE no longer anticoagulated, who presents the emergency department due to altered mental status.  Per EMS, patient had new onset confusion earlier today.  His wife states that he is normally A&O x4 and takes care of himself.  He was last seen normal around 0900 this morning.  He was found this afternoon altered and EMS was called.  On my initial evaluation patient was febrile with a rectal temperature of 102 F.  He was able to state his name and location but otherwise could not answer questions clearly.  He did not appear dysarthric but appeared more to have a transient expressive aphasia.  He could follow commands and move all 4 extremities.  He had a small amount of swelling around the left eye that was concerning for possible facial droop as well.  Per records, patient had an arthroscopy of the right shoulder on February 24.  He has a history of PEs and used to take Xarelto but is no longer anticoagulated.  Last echocardiogram on March 15, 2019 showed an LVEF of 55 to 60%.  Level 5 caveat due to altered mental status.    Past Medical History:  Diagnosis Date   Back pain    CKD (chronic kidney disease), stage III (HCC)    CVA (cerebral infarction)    Diastolic dysfunction    ED (erectile dysfunction)    GERD (gastroesophageal reflux disease)    Hypercalcemia    Hyperlipidemia    Hypertension    Insomnia    Long-term use of high-risk medication    Nephrocalcinosis    Nephrolithiasis    Obesity    Osteopenia     Pancreatitis    PE (pulmonary embolism)    Proteinuria    Sarcoidosis    Smoker     Patient Active Problem List   Diagnosis Date Noted   Acute encephalopathy 11/18/2020   Chronic right shoulder pain 06/18/2020   OSA (obstructive sleep apnea) 05/05/2020   Hypersomnia with sleep apnea 12/20/2019   Psychophysiological insomnia 09/18/2019   Nocturia more than twice per night 09/18/2019   Renal hypertension 09/18/2019   Moderate asthma 09/18/2019   Tobacco abuse 09/18/2019   Non-restorative sleep 09/18/2019   Nontraumatic complete tear of right rotator cuff 08/15/2019   Delirium 03/16/2019   Stage 3b chronic kidney disease    CVA (cerebral infarction)    Diastolic dysfunction    Pseudarthrosis 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 02/28/2019   Intervertebral disc disorder with radiculopathy of lumbar region 09/13/2017   Acute gouty arthritis 03/07/2017   Arthralgia of left foot 03/07/2017   Nephrolithiasis    Fever, unspecified 11/02/2015   Essential hypertension 02/07/2015   Pulmonary embolism ([place]) 01/30/2013   Sarcoidosis 01/30/2013   Hemoptysis 01/30/2013   SHOULDER PAIN, LEFT 11/28/2009    Past Surgical History:  Procedure Laterality Date   ABDOMINAL EXPLORATION SURGERY     BACK SURGERY     CHOLECYSTECTOMY     SHOULDER ARTHROSCOPY Right 10/30/2020   Procedure: ARTHROSCOPY  SHOULDER WITH EXTENSIVE DEBRIDEMENT;  Surgeon: Mcarthur Rossetti, MD;  Location: [facility];  Service: Orthopedics;  Laterality: Right;   SHOULDER SURGERY     TRACHEOSTOMY     closed       Family History  Problem Relation Age of Onset   Hypertension Father    CVA Father    Lung cancer Father    Alzheimer's disease Mother    Hypertension Sister     Social History   Tobacco Use   Smoking status: Former Smoker    Types: Cigarettes   Smokeless tobacco: Never Used   Tobacco comment: quit less than five years ago  Substance Use Topics    Alcohol use: No    Alcohol/week: 0.0 standard drinks   Drug use: No    Home Medications Prior to Admission medications   Medication Sig Start Date End Date Taking? Authorizing Provider  albuterol (PROVENTIL HFA) 108 (90 Base) MCG/ACT inhaler Inhale 2 puffs into the lungs every 4 (four) hours as needed for wheezing or shortness of breath. Needs office visit for any refills. 01/05/17   Shawnee Knapp, MD  ALPRAZolam Duanne Moron) 0.5 MG tablet Take 0.5 mg by mouth 2 (two) times daily as needed. 07/03/20   [provider]  aspirin 81 MG tablet Take 1 tablet (81 mg total) by mouth daily. 02/01/13   [name], MD  atorvastatin (LIPITOR) 20 MG tablet Take 20 mg by mouth daily. 02/02/19   [provider]  diltiazem (CARDIZEM CD) 360 MG 24 hr capsule Take 360 mg by mouth daily.    [provider]  fluticasone (FLONASE) 50 MCG/ACT nasal spray Place 2 sprays into both nostrils daily as needed for allergies or rhinitis.    [provider]  hydrALAZINE (APRESOLINE) 50 MG tablet Take 50 mg by mouth 2 (two) times a day. 03/12/19   [provider]  labetalol (NORMODYNE) 300 MG tablet Take 300 mg by mouth 3 (three) times daily.    [provider]  losartan (COZAAR) 100 MG tablet Take 100 mg by mouth daily. 01/05/19   [provider]  MELATONIN PO Take 12.5 mg by mouth at bedtime.    [provider]  ondansetron (ZOFRAN ODT) 4 MG disintegrating tablet Take 1 tablet (4 mg total) by mouth every 8 (eight) hours as needed for nausea or vomiting. 10/30/20   Mcarthur Rossetti, MD  oxyCODONE (ROXICODONE) 5 MG immediate release tablet Take 1-2 tablets (5-10 mg total) by mouth every 6 (six) hours as needed for severe pain. 11/05/20   Pete Pelt, PA-C  predniSONE (DELTASONE) 5 MG tablet Take 5 mg by mouth daily. Reported on 11/02/2015    [provider]  tamsulosin (FLOMAX) 0.4 MG CAPS capsule Take 0.4 mg by mouth daily. 11/01/18   [provider]  traZODone (DESYREL) 100 MG tablet Take 100 mg by mouth at bedtime. 06/18/19   [provider]    Allergies    Codeine and Hydromorphone  Review of Systems   Review of Systems  Unable to perform ROS: Mental status change   Physical Exam Updated Vital Signs BP 127/77    Pulse 71    Temp 99.2 F (37.3 C) (Oral)    Resp (!) 21    Ht 6' (1.829 m)    Wt 108 kg    SpO2 99%    BMI 32.29 kg/m   Physical Exam Vitals and nursing note reviewed.  Constitutional:  General: He is in acute distress.     Appearance: He is ill-appearing. He is not toxic-appearing or diaphoretic.  HENT:     Head: Normocephalic and atraumatic.     Right Ear: External ear normal.     Left Ear: External ear normal.     Nose: Nose normal.     Mouth/Throat:     Pharynx: Oropharynx is clear.  Eyes:     General: No scleral icterus.       Right eye: No discharge.        Left eye: No discharge.     Extraocular Movements: Extraocular movements intact.     Conjunctiva/sclera: Conjunctivae normal.     Pupils: Pupils are equal, round, and reactive to light.     Comments: Pupils are 2 to 3 mm and fixed.  Nonreactive to light.  Cardiovascular:     Rate and Rhythm: Normal rate and regular rhythm.     Pulses: Normal pulses.     Heart sounds: Normal heart sounds. No murmur heard. No friction rub. No gallop.   Pulmonary:     Effort: Respiratory distress present.     Breath sounds: No stridor. Rales present. No wheezing or rhonchi.     Comments: Patient desaturated into the 70s on room air.  Placed on nonrebreather and oxygen saturations in the high 90s.  Rales noted in the lung bases. Abdominal:     General: Abdomen is flat.     Tenderness: There is no abdominal tenderness.  Musculoskeletal:        General: Normal range of motion.     Cervical back: Normal range of motion and neck supple. No tenderness.     Right lower leg: No edema.     Left lower leg: No edema.  Skin:    General: Skin  is warm and dry.  Neurological:     Comments: Patient able to state his name and location.  Otherwise, patient has difficulty answering questions.  Does not appear to be dysarthric.  Possible expressive aphasia.   Following commands.  Moving all 4 extremities when asked.  Strength appears to be intact in all 4 extremities.    ED Results / Procedures / Treatments   Labs (all labs ordered are listed, but only abnormal results are displayed) Labs Reviewed  COMPREHENSIVE METABOLIC PANEL - Abnormal; Notable for the following components:      Result Value   Creatinine, Ser 2.31 (*)    Calcium 8.8 (*)    Total Protein 6.3 (*)    Albumin 3.4 (*)    AST 63 (*)    ALT 74 (*)    GFR, Estimated 31 (*)    All other components within normal limits  CBC WITH DIFFERENTIAL/PLATELET - Abnormal; Notable for the following components:   RBC 4.20 (*)    All other components within normal limits  BRAIN NATRIURETIC PEPTIDE - Abnormal; Notable for the following components:   B Natriuretic Peptide 1,079.0 (*)    All other components within normal limits  I-STAT CHEM 8, ED - Abnormal; Notable for the following components:   Chloride 97 (*)    Creatinine, Ser 2.20 (*)    All other components within normal limits  I-STAT VENOUS BLOOD GAS, ED - Abnormal; Notable for the following components:   pCO2, Ven 71.6 (*)    Bicarbonate 32.8 (*)    TCO2 35 (*)    Acid-Base Excess 4.0 (*)    Calcium, Ion 1.14 (*)  All other components within normal limits  TROPONIN I (HIGH SENSITIVITY) - Abnormal; Notable for the following components:   Troponin I (High Sensitivity) 178 (*)    All other components within normal limits  RESP PANEL BY RT-PCR (FLU A&B, COVID) ARPGX2  URINE CULTURE  CULTURE, BLOOD (ROUTINE X 2)  CULTURE, BLOOD (ROUTINE X 2)  CSF CULTURE W GRAM STAIN  LACTIC ACID, PLASMA  PROTIME-INR  APTT  URINALYSIS, ROUTINE W REFLEX MICROSCOPIC  LACTIC ACID, PLASMA  PROTEIN AND GLUCOSE, CSF  ANGIOTENSIN  CONVERTING ENZYME  ANGIOTENSIN CONVERTING ENZYME, CSF  HERPES SIMPLEX VIRUS(HSV) DNA BY PCR  OLIGOCLONAL BANDS, CSF + SERM  DRAW EXTRA CLOT TUBE  SEDIMENTATION RATE  CSF CELL COUNT WITH DIFFERENTIAL  CSF CELL COUNT WITH DIFFERENTIAL  RAPID URINE DRUG SCREEN, HOSP PERFORMED  AMMONIA  FOLATE  VITAMIN B12  VITAMIN B1  IGG CSF INDEX  DRAW EXTRA CLOT TUBE  CYTOLOGY - NON PAP  TROPONIN I (HIGH SENSITIVITY)   EKG EKG Interpretation  Date/Time:  Tuesday November 18 2020 18:48:41 EDT Ventricular Rate:  86 PR Interval:    QRS Duration: 127 QT Interval:  396 QTC Calculation: 471 R Axis:   117 Text Interpretation: Atrial fibrillation Nonspecific intraventricular conduction delay Nonspecific repol abnormality, diffuse leads Confirmed by Madalyn Rob [phone]) on 11/18/2020 7:27:19 PM  Radiology DG Chest Port 1 View  Result Date: 11/18/2020 CLINICAL DATA:  Code stroke, questionable sepsis EXAM: PORTABLE CHEST 1 VIEW COMPARISON:  03/14/2019 FINDINGS: Single frontal view of the chest demonstrates marked enlargement the cardiac silhouette. There is central vascular congestion with bibasilar airspace disease and small bilateral effusions. No pneumothorax. No acute bony abnormalities. IMPRESSION: 1. Moderate congestive heart failure. Electronically Signed   By: Randa Ngo M.D.   On: 11/18/2020 19:45   CT HEAD CODE STROKE WO CONTRAST  Result Date: 11/18/2020 CLINICAL DATA:  Code stroke. Neuro deficit, acute, stroke suspected. Additional history provided: Last known well 0845. Expressive aphasia. EXAM: CT HEAD WITHOUT CONTRAST TECHNIQUE: Contiguous axial images were obtained from the base of the skull through the vertex without intravenous contrast. COMPARISON:  Brain MRI 03/14/2019. CT angiogram head/neck 03/14/2019. Noncontrast head CT 03/14/2019. FINDINGS: Brain: Cerebral volume is normal for age. Moderate patchy and ill-defined hypoattenuation within the cerebral white matter which is  nonspecific, but compatible with chronic small vessel ischemic disease. There is no acute intracranial hemorrhage. No demarcated cortical infarct is identified. No extra-axial fluid collection. No evidence of intracranial mass. No midline shift. Partially empty sella turcica. Vascular: No hyperdense vessel.  Atherosclerotic calcifications. Skull: Normal. Negative for fracture or focal lesion. Sinuses/Orbits: Visualized orbits show no acute finding. Trace scattered paranasal sinus mucosal thickening. ASPECTS ([place] Stroke Program Early CT Score) - Ganglionic level infarction (caudate, lentiform nuclei, internal capsule, insula, M1-M3 cortex): 7 - Supraganglionic infarction (M4-M6 cortex): 3 Total score (0-10 with 10 being normal): 10 These results were called by telephone at the time of interpretation on 11/18/2020 at 8:13 pm to provider [facility] , who verbally acknowledged these results. IMPRESSION: No evidence of acute infarct or acute intracranial hemorrhage. ASPECTS is 10. Redemonstrated moderate cerebral white matter chronic small vessel ischemic disease. Minimal scattered paranasal sinus mucosal thickening. Electronically Signed   By: Kellie Simmering DO   On: 11/18/2020 20:14   CT ANGIO HEAD CODE STROKE  Result Date: 11/18/2020 CLINICAL DATA:  Stroke/TIA, assess extracranial arteries. Stroke/TIA, assess intracranial arteries. EXAM: CT ANGIOGRAPHY HEAD AND NECK TECHNIQUE: Multidetector CT imaging of the head and neck was performed  using the standard protocol during bolus administration of intravenous contrast. Multiplanar CT image reconstructions and MIPs were obtained to evaluate the vascular anatomy. Carotid stenosis measurements (when applicable) are obtained utilizing NASCET criteria, using the distal internal carotid diameter as the denominator. CONTRAST:  60 mL Omnipaque 350 intravenous contrast. COMPARISON:  CT angiogram head/neck 03/14/2019. FINDINGS: CTA NECK FINDINGS Aortic arch: Standard  aortic branching. No hemodynamically significant innominate or proximal subclavian artery stenosis. Right carotid system: CCA and ICA patent within the neck without stenosis. Tortuosity of the mid to distal cervical ICA. Minimal calcified plaque at the carotid bifurcations bilaterally. Left carotid system: CCA and ICA patent within the neck without stenosis. Tortuosity of the mid to distal cervical ICA. Vertebral arteries: Vertebral arteries codominant and patent within the neck without stenosis. Skeleton: Cervical spondylosis with multilevel disc space narrowing, disc bulges, uncovertebral hypertrophy and facet arthrosis. No acute bony abnormality or aggressive osseous lesion. Other neck: No neck mass or cervical lymphadenopathy. Thyroid unremarkable. Upper chest: Interlobular septal thickening within the imaged lung apices. Partially imaged layering bilateral pleural effusions. 7 mm right upper lobe pulmonary nodule (series 9, image 342). Redemonstrated calcified mediastinal lymph nodes. Review of the MIP images confirms the above findings CTA HEAD FINDINGS Anterior circulation: The intracranial internal carotid arteries are patent. Mild calcified plaque within both vessels without stenosis. The M1 middle cerebral arteries are patent. No M2 proximal branch occlusion or high-grade proximal stenosis is identified. The left A1 is dominant and the right A1 is diminutive. The left ACA remains dominant with an azygos type ACA appearance. No intracranial aneurysm is identified. Posterior circulation: The intracranial vertebral arteries are patent. The basilar artery is patent. The posterior cerebral arteries are patent. A right posterior communicating artery is present. The left posterior communicating artery is hypoplastic or absent. Venous sinuses: Within the limitations of contrast timing, no convincing thrombus. Anatomic variants: As described. Other: There is asymmetric prominence and enhancement of the left  superior ophthalmic vein. No associated asymmetry of the cavernous sinuses is appreciated. Review of the MIP images confirms the above findings These results were called by telephone at the time of interpretation on 11/18/2020 at 8:40 pm to provider [facility] , who verbally acknowledged these results. IMPRESSION: CTA neck: 1. The bilateral common carotid, internal carotid and vertebral arteries are patent within the neck without stenosis. 2. Interlobular septal thickening within the imaged lung apices, nonspecific but possibly reflecting interstitial edema. Additionally, there are partially imaged layering bilateral pleural effusions. 3. 7 mm right upper lobe pulmonary nodule. Non-contrast chest CT at 6-12 months is recommended. If the nodule is stable at time of repeat CT, then future CT at 18-24 months (from today's scan) is considered optional for low-risk patients, but is recommended for high-risk patients. This recommendation follows the consensus statement: Guidelines for Management of Incidental Pulmonary Nodules Detected on CT Images: From the Fleischner Society 2017; Radiology 2017; 284:228-243. CTA head: 1. No intracranial large vessel occlusion or proximal high-grade arterial stenosis. 2. Asymmetric prominence and enhancement of the left superior ophthalmic vein, new from the prior CTA head/neck of 03/14/2019. No associated asymmetry of the cavernous sinuses is appreciated and this finding is indeterminate in etiology. Electronically Signed   By: Kellie Simmering DO   On: 11/18/2020 20:54   CT ANGIO NECK CODE STROKE  Result Date: 11/18/2020 CLINICAL DATA:  Stroke/TIA, assess extracranial arteries. Stroke/TIA, assess intracranial arteries. EXAM: CT ANGIOGRAPHY HEAD AND NECK TECHNIQUE: Multidetector CT imaging of the head and neck was performed using  the standard protocol during bolus administration of intravenous contrast. Multiplanar CT image reconstructions and MIPs were obtained to evaluate the  vascular anatomy. Carotid stenosis measurements (when applicable) are obtained utilizing NASCET criteria, using the distal internal carotid diameter as the denominator. CONTRAST:  60 mL Omnipaque 350 intravenous contrast. COMPARISON:  CT angiogram head/neck 03/14/2019. FINDINGS: CTA NECK FINDINGS Aortic arch: Standard aortic branching. No hemodynamically significant innominate or proximal subclavian artery stenosis. Right carotid system: CCA and ICA patent within the neck without stenosis. Tortuosity of the mid to distal cervical ICA. Minimal calcified plaque at the carotid bifurcations bilaterally. Left carotid system: CCA and ICA patent within the neck without stenosis. Tortuosity of the mid to distal cervical ICA. Vertebral arteries: Vertebral arteries codominant and patent within the neck without stenosis. Skeleton: Cervical spondylosis with multilevel disc space narrowing, disc bulges, uncovertebral hypertrophy and facet arthrosis. No acute bony abnormality or aggressive osseous lesion. Other neck: No neck mass or cervical lymphadenopathy. Thyroid unremarkable. Upper chest: Interlobular septal thickening within the imaged lung apices. Partially imaged layering bilateral pleural effusions. 7 mm right upper lobe pulmonary nodule (series 9, image 342). Redemonstrated calcified mediastinal lymph nodes. Review of the MIP images confirms the above findings CTA HEAD FINDINGS Anterior circulation: The intracranial internal carotid arteries are patent. Mild calcified plaque within both vessels without stenosis. The M1 middle cerebral arteries are patent. No M2 proximal branch occlusion or high-grade proximal stenosis is identified. The left A1 is dominant and the right A1 is diminutive. The left ACA remains dominant with an azygos type ACA appearance. No intracranial aneurysm is identified. Posterior circulation: The intracranial vertebral arteries are patent. The basilar artery is patent. The posterior cerebral  arteries are patent. A right posterior communicating artery is present. The left posterior communicating artery is hypoplastic or absent. Venous sinuses: Within the limitations of contrast timing, no convincing thrombus. Anatomic variants: As described. Other: There is asymmetric prominence and enhancement of the left superior ophthalmic vein. No associated asymmetry of the cavernous sinuses is appreciated. Review of the MIP images confirms the above findings These results were called by telephone at the time of interpretation on 11/18/2020 at 8:40 pm to provider [facility] , who verbally acknowledged these results. IMPRESSION: CTA neck: 1. The bilateral common carotid, internal carotid and vertebral arteries are patent within the neck without stenosis. 2. Interlobular septal thickening within the imaged lung apices, nonspecific but possibly reflecting interstitial edema. Additionally, there are partially imaged layering bilateral pleural effusions. 3. 7 mm right upper lobe pulmonary nodule. Non-contrast chest CT at 6-12 months is recommended. If the nodule is stable at time of repeat CT, then future CT at 18-24 months (from today's scan) is considered optional for low-risk patients, but is recommended for high-risk patients. This recommendation follows the consensus statement: Guidelines for Management of Incidental Pulmonary Nodules Detected on CT Images: From the Fleischner Society 2017; Radiology 2017; 284:228-243. CTA head: 1. No intracranial large vessel occlusion or proximal high-grade arterial stenosis. 2. Asymmetric prominence and enhancement of the left superior ophthalmic vein, new from the prior CTA head/neck of 03/14/2019. No associated asymmetry of the cavernous sinuses is appreciated and this finding is indeterminate in etiology. Electronically Signed   By: Kellie Simmering DO   On: 11/18/2020 20:54    Procedures .Critical Care Performed by: Manuel Sexton, PA-C Authorized by: Manuel Sexton, PA-C   Critical care provider statement:    Critical care time (minutes):  45   Critical care was necessary to treat or  prevent imminent or life-threatening deterioration of the following conditions:  Sepsis and respiratory failure   Critical care was time spent personally by me on the following activities:  Discussions with consultants, evaluation of patient's response to treatment, examination of patient, ordering and performing treatments and interventions, ordering and review of laboratory studies, ordering and review of radiographic studies, pulse oximetry, re-evaluation of patient's condition, obtaining history from patient or surrogate and review of old charts     Medications Ordered in ED Medications  ceFEPIme (MAXIPIME) 2 g in sodium chloride 0.9 % 100 mL IVPB (2 g Intravenous New Bag/Given 11/19/20 0003)  ampicillin (OMNIPEN) 2 g in sodium chloride 0.9 % 100 mL IVPB (0 g Intravenous Stopped 11/18/20 2257)  ceFEPIme (MAXIPIME) 2 g in sodium chloride 0.9 % 100 mL IVPB (has no administration in time range)  vancomycin (VANCOREADY) IVPB 750 mg/150 mL (has no administration in time range)  thiamine (B-1) injection 100 mg (100 mg Intravenous Given 11/18/20 2243)    Or  thiamine tablet 100 mg ( Oral See Alternative 11/18/20 2243)  heparin bolus via infusion 6,000 Units (has no administration in time range)  heparin ADULT infusion 100 units/mL (25000 units/250mL) (has no administration in time range)  lactated ringers bolus 1,000 mL (0 mLs Intravenous Stopped 11/18/20 2204)  acetaminophen (TYLENOL) suppository 650 mg (650 mg Rectal Given 11/18/20 1931)  vancomycin (VANCOREADY) IVPB 2000 mg/400 mL (0 mg Intravenous Stopped 11/19/20 0004)  iohexol (OMNIPAQUE) 350 MG/ML injection 60 mL (60 mLs Intravenous Contrast Given 11/18/20 2020)    ED Course  I have reviewed the triage vital signs and the nursing notes.  Pertinent labs & imaging results that were available during my care of the  patient were reviewed by me and considered in my medical decision making (see chart for details).  Clinical Course as of 11/19/20 0010  Tue Nov 18, 2020  1933 Patient febrile but also acutely altered.  Appears to have difficulty finding his words.  I am concerned that this is a possible expressive aphasia versus just delirium.  I spoke to neurology and they agree.  Given his age doubt that a fever/sepsis would cause this degree of encephalopathy.  Will activate code stroke and will obtain a stat CTA of the head and neck. They recommend abx coverage for possible meningitis.  [LJ]  2252 B Natriuretic Peptide(!): 1,079.0 [LJ]  2252 Troponin I (High Sensitivity)(!!): 178 [LJ]  2252 Patient discussed with neuro.  Feels that CT scan as well as CTA is reassuring.  He does note the patient has a history of sarcoid and is on steroids at the moment.  Consideration for possible neurosarcoid.  Also recommends that we obtain an LP.  He added ampicillin as well as acyclovir to patient's regimen today.  Requested that we obtain an MRI of the brain as well as MRV.  Technician with MRI declines and an MRV can be performed due to patient's current renal function. [LJ]    Clinical Course User Index [LJ] Manuel Sexton, PA-C   MDM Rules/Calculators/A&P                          Patient is a 65 year old male who presents the emergency department due to altered mental status, hypoxia, transient expressive aphasia.  Patient initially hypoxic on room air.  He was placed on a nonrebreather and oxygen saturations improved to the high 90s.  Given his expressive aphasia upon arrival, he was discussed with neurology.  He was found to be febrile at 102 F rectally.  Neither myself or neurology felt his fever at his age would likely cause this degree of encephalopathy so code stroke was initiated.  CT of the head as well as CTA of the head and neck were reassuring.  Patient has a wide differential diagnosis.  Possible  neurosarcoid, meningitis, PE.  Per records, patient has a history of pulmonary embolism.  He used to be anticoagulated on Xarelto.  He is no longer anticoagulated.  He recently had a right shoulder debridement in late February.  Patient had an elevated troponin at178.  BNP elevated at 1079.  Chest x-ray showing moderate congestive heart failure.  There was concern initially due to patient's fever and altered mental status that he was likely septic.  His lab work is reassuring from the standpoint of sepsis.  CBC shows no leukocytosis or neutrophilia.  No elevation in lactic acid.  Patient given 1 L of IV fluids.  Did not give additional fluids given his elevated BNP.  Neurology recommended an LP but this was unable to be performed at this time.  MRI of the brain pending.  There is concern regarding the possibility of pulmonary embolism given his hypoxia, history of PEs, and recent shoulder surgery. Possible AMS 2/2 hypoxia.  Patient started on heparin.  Unable to obtain a CTA of the chest given patient's AKI today.  Patient discussed with Dr. Hal Hope with our medicine team.  They will accept patient at this time.  Patient will likely receive a VQ scan in the morning.  Covid test is negative.  Final Clinical Impression(s) / ED Diagnoses Final diagnoses:  Altered mental status, unspecified altered mental status type  Hypoxia  Aphasia    Rx / DC Orders ED Discharge Orders    None       Manuel Sexton, PA-C 11/19/20 0010    Lucrezia Starch, MD 11/19/20 [phone]

## 2020-11-18 NOTE — Consult Note (Signed)
NEUROLOGY CONSULTATION NOTE   Date of service: November 18, 2020 Patient Name: [name] MRN:  [id number] DOB:  08/11/1956 Reason for consult: "Stroke code" _ _ _   _ __   _ __ _ _  __ __   _ __   __ _  History of Present Illness  [name] is a 65 y.o. 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 Patient is oriented to self and place but not to time. Has poor attention and unable to provide any meaningful history. Significant other reports that he recently had surgery on his R shoulder, about 2.5 weeks ago. She noticed that he was more lethargic since Saturday but was able to do most ADLs. Today, she last saw him at 0700 when he was going out to get some coffee. Daughter noticed that at around ~5PM, he was "out of it". Wife reports that she got here at 18PM and asked some basic questions and he could not answer those questions. He could not stand up or lift his arms. He had some trouble understanding words and seemed confused and could not stay awake. Wife also reports that he sounded wheezy today and he told his wife yesterday, that he was a little shortness of breath last weekend. She also reports that she noted today that he was a little shaky. Like tremoring uncontrollable today. Not sure if he has had a shingles vaccine. Nothing out of the ordinary otherwise, no other obvious signs or symptoms of an upper respiratory infection, no UTI symptoms that wife had noted. No significant EtOH intake. No new medications. Had a shoulder surgery 2/5 weeks ago. He was disoriented from anesthesia but was fine afterwards.  On further discussion with wife when she was present in person, she reports that he does get some left eyelid droop from time to time. The swelling of the left eye noted today is worse than when she saw him  earlier.  NIHSS components Score: Comment  1a Level of Conscious 0'[x]'  1'[]'  2'[]'  3'[]'      1b LOC Questions 0'[]'  1'[]'  2'[x]'       1c LOC Commands 0'[x]'  1'[]'  2'[]'       2 Best Gaze 0'[x]'  1'[]'  2'[]'       3 Visual 0'[x]'  1'[]'  2'[]'  3'[]'      4 Facial Palsy 0'[]'  1'[x]'  2'[]'  3'[]'    Left upper facial drooping  5a Motor Arm - left 0'[x]'  1'[]'  2'[]'  3'[]'  4'[]'  UN'[]'    5b Motor Arm - Right 0'[x]'  1'[]'  2'[]'  3'[]'  4'[]'  UN'[]'    6a Motor Leg - Left 0'[x]'  1'[]'  2'[]'  3'[]'  4'[]'  UN'[]'    6b Motor Leg - Right 0'[x]'  1'[]'  2'[]'  3'[]'  4'[]'  UN'[]'    7 Limb Ataxia 0'[x]'  1'[]'  2'[]'  3'[]'  UN'[]'     8 Sensory 0'[x]'  1'[]'  2'[]'  UN'[]'      9 Best Language 0'[]'  1'[x]'  2'[]'  3'[]'      10 Dysarthria 0'[x]'  1'[]'  2'[]'  UN'[]'      11 Extinct. and Inattention 0'[x]'  1'[]'  2'[]'       TOTAL: 4    LKW: 0700 on 11/18/20. NIHSS: 4. Mostly for encephalopathy but some concern for aphasia out of proportion to encephalopathy. TPA: outside the window Thrombectomy: No LVO.   ROS   Endorses feeling foggy in his head. Endorses pain all over but otherwise unable to get a detailed ROS  given his encephalopathy.  Past History   Past Medical History:  Diagnosis Date  . Back pain   . CKD (chronic kidney disease), stage III ([place])   . CVA (cerebral infarction)   . Diastolic dysfunction   . ED (erectile dysfunction)   . GERD (gastroesophageal reflux disease)   . Hypercalcemia   . Hyperlipidemia   . Hypertension   . Insomnia   . Long-term use of high-risk medication   . Nephrocalcinosis   . Nephrolithiasis   . Obesity   . Osteopenia   . Pancreatitis   . PE (pulmonary embolism)   . Proteinuria   . Sarcoidosis   . Smoker    Past Surgical History:  Procedure Laterality Date  . ABDOMINAL EXPLORATION SURGERY    . BACK SURGERY    . CHOLECYSTECTOMY    . SHOULDER ARTHROSCOPY Right 10/30/2020   Procedure: ARTHROSCOPY SHOULDER WITH EXTENSIVE DEBRIDEMENT;  Surgeon: Mcarthur Rossetti, MD;  Location: [facility];  Service: Orthopedics;  Laterality: Right;  . SHOULDER SURGERY    . TRACHEOSTOMY      closed   Family History  Problem Relation Age of Onset  . Hypertension Father   . CVA Father   . Lung cancer Father   . Alzheimer's disease Mother   . Hypertension Sister    Social History   Socioeconomic History  . Marital status: Married    Spouse name: Not on file  . Number of children: Not on file  . Years of education: Not on file  . Highest education level: Not on file  Occupational History  . Not on file  Tobacco Use  . Smoking status: Former Smoker    Types: Cigarettes  . Smokeless tobacco: Never Used  . Tobacco comment: quit less than five years ago  Substance and Sexual Activity  . Alcohol use: No    Alcohol/week: 0.0 standard drinks  . Drug use: No  . Sexual activity: Yes  Other Topics Concern  . Not on file  Social History Narrative  . Not on file   Social Determinants of Health   Financial Resource Strain: Not on file  Food Insecurity: Not on file  Transportation Needs: Not on file  Physical Activity: Not on file  Stress: Not on file  Social Connections: Not on file   Allergies  Allergen Reactions  . Codeine Nausea Only  . Hydromorphone Other (See Comments)    hallucinations     Medications  (Not in a hospital admission)    Vitals   Vitals:   11/18/20 1842 11/18/20 1850 11/18/20 1852 11/18/20 2049  BP:  (!) 134/110  136/85  Pulse:  86  79  Resp:  20  19  Temp:  (!) 101.3 F (38.5 C)    TempSrc:  Oral    SpO2: (S) (!) 65% 100%  98%  Weight:   108 kg   Height:   6' (1.829 m)      Body mass index is 32.29 kg/m.  Physical Exam   General: Laying comfortably in bed; in no acute distress. HENT: Normal oropharynx and mucosa. Normal external appearance of ears and nose. Neck: Supple, no pain or tenderness CV: No JVD. No peripheral edema. Pulmonary: Symmetric Chest rise. Normal respiratory effort. Abdomen: Soft to touch, non-tender. Ext: No cyanosis, edema, or deformity Skin: No rash. Normal palpation of skin.  Musculoskeletal:  Normal digits and nails by inspection. No clubbing.  Neurologic Examination  Mental status/Cognition: Alert, oriented to self, place, but  not to month and year, poor attention. Speech/language: Fluent, comprehension intact to simple commands, able to name some but not all objects. repetition intact. Loses his train of thoughts and bradyphrenia. Cranial nerves:   CN II Pupils equal and reactive to light, does seem to blink to threat BL.   CN III,IV,VI EOM intact, no gaze preference or deviation, no nystagmus. Left upper eyelid droop.   CN V normal sensation in V1, V2, and V3 segments bilaterally   CN VII Flattening of the forehead creases on the left along with drooping of the eyelid on the left. No lower facial droop.   CN VIII normal hearing to speech   CN IX & X normal palatal elevation, no uvular deviation   CN XI 5/5 head turn and 5/5 shoulder shrug bilaterally   CN XII midline tongue protrusion   Motor:  Muscle bulk: normal, tone normal, pronator drift none tremor none Mvmt Root Nerve  Muscle Right Left Comments  SA C5/6 Ax Deltoid     EF C5/6 Mc Biceps 5 5   EE C6/7/8 Rad Triceps 5 5   WF C6/7 Med FCR     WE C7/8 PIN ECU     F Ab C8/T1 U ADM/FDI 5 5   HF L1/2/3 Fem Illopsoas 5 5   KE L2/3/4 Fem Quad     DF L4/5 D Peron Tib Ant 5 5   PF S1/2 Tibial Grc/Sol      Reflexes:  Right Left Comments  Pectoralis      Biceps (C5/6) 2 2   Brachioradialis (C5/6) 2 2    Triceps (C6/7) 2 2    Patellar (L3/4) 2 2    Achilles (S1) 2 2    Hoffman      Plantar     Jaw jerk    Sensation:  Light touch Intact   Pin prick    Temperature    Vibration   Proprioception    Coordination/Complex Motor:  - Finger to Nose intact BL - Heel to shin unable to comprehend how to do it. - Rapid alternating movement are slowed. - Gait: deferred.  Labs   CBC:  Recent Labs  Lab 11/18/20 1858 11/18/20 1921 11/18/20 1922  WBC 8.0  --   --   NEUTROABS 5.8  --   --   HGB 13.0 13.6 13.6   HCT 39.4 40.0 40.0  MCV 93.8  --   --   PLT 244  --   --     Basic Metabolic Panel:  Lab Results  Component Value Date   NA 138 11/18/2020   K 4.2 11/18/2020   CO2 31 11/18/2020   GLUCOSE 82 11/18/2020   BUN 16 11/18/2020   CREATININE 2.20 (H) 11/18/2020   CALCIUM 8.8 (L) 11/18/2020   GFRNONAA 31 (L) 11/18/2020   GFRAA 58 (L) 03/14/2019   Alcohol Level     Component Value Date/Time   ETH <10 03/14/2019 1308    CT Head without contrast: CTH was negative for a large hypodensity concerning for a large territory infarct or hyperdensity concerning for an ICH  CT angio Head and Neck with contrast:  CTA neck: 1. The bilateral common carotid, internal carotid and vertebral arteries are patent within the neck without stenosis. 2. Interlobular septal thickening within the imaged lung apices, nonspecific but possibly reflecting interstitial edema. Additionally, there are partially imaged layering bilateral pleural effusions. 3. 7 mm right upper lobe pulmonary nodule. Non-contrast chest CT at 6-12 months is  recommended. If the nodule is stable at time of repeat CT, then future CT at 18-24 months (from today's scan) is considered optional for low-risk patients, but is recommended for high-risk patients. This recommendation follows the consensus statement: Guidelines for Management of Incidental Pulmonary Nodules Detected on CT Images: From the Fleischner Society 2017; Radiology 2017; 284:228-243.  CTA head: No LVO. Asymmetric prominence and enhancement of the left superior ophthalmic vein, new from the prior CTA head/neck of 03/14/2019. No associated asymmetry of the cavernous sinuses is appreciated and this finding is indeterminate in etiology.  MRI Brain pending  Impression   [name] is a 65 y.o. 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 His neurologic examination is notable for poor attention, cognitive and executive dysfunction along with some concern for aphasia out of proportion to his encephalopathy. He also has swelling around his left eye along with decreased creasing of left forehead and left upper eyelid drooping. Workup so far with CTH and CTA with left superior ophthalmic vein asymmetric enhancement(new since prior CTA), interstitial lung edema on imaged lung apices, 31m pulm nodule.  Differential is broad but most concerning for infection vs autoimmune vs vascular/sinus thrombosis. No nuchal rigidity, denies headache, negative kernigs or brudzinski's , but he is on steroids and does have fever and new acute onset encephalopathy. With his history of sarcoidosis, quick literature review does show that autoimmune processes could be potentially associated with idiopathic orbital inflammation which may present as superior orbital vein enhancement. Neurosarcoidosis also has a varied presentation and can present with cranial neuropathy and/or cognitive deficit.  Recommendations  - MRI Brain without contrast - no contrast at this time due to elevated creatinine - MR Venogram head - to rule out CVST. - Recommend broad spectrum antimicrobial coverage for potential meningitis/encephalitis with Vanc, Cefepime, Acyclovir and Ampicillin(listeria coverage). - Recommend routine EEG. Low suspicion for seizure at this time thou. - Recommend LP with opening pressure, CSF cell count, differential, protein, glucose, CSF lactate, CSF gram stain and Cultures, IgG index and Oligoclonal bands, CSF and serum ACE. - Serum TSH, Vit B12, folate, RPR, ESR and CRP. ______________________________________________________________________   Thank you for the opportunity to take part in the care of this patient. If you have any  further questions, please contact the neurology consultation attending.  Signed,  SComanchePager Number [phone]_ _ _   _ __   _ __ _ _  __ __   _ __   __ _

## 2020-11-18 NOTE — ED Triage Notes (Signed)
Received via EMS with new onset confusion, oriented to self only, overcomes gravity with extremities, follows simple commands, left eye swollen with clear drainage

## 2020-11-19 ENCOUNTER — Observation Stay (HOSPITAL_COMMUNITY): Payer: BC Managed Care – PPO

## 2020-11-19 ENCOUNTER — Observation Stay (HOSPITAL_BASED_OUTPATIENT_CLINIC_OR_DEPARTMENT_OTHER): Payer: BC Managed Care – PPO

## 2020-11-19 ENCOUNTER — Ambulatory Visit: Payer: BC Managed Care – PPO

## 2020-11-19 ENCOUNTER — Encounter (HOSPITAL_COMMUNITY): Payer: Self-pay | Admitting: Internal Medicine

## 2020-11-19 ENCOUNTER — Other Ambulatory Visit: Payer: Self-pay

## 2020-11-19 DIAGNOSIS — I5030 Unspecified diastolic (congestive) heart failure: Secondary | ICD-10-CM | POA: Diagnosis not present

## 2020-11-19 DIAGNOSIS — R7881 Bacteremia: Secondary | ICD-10-CM | POA: Diagnosis not present

## 2020-11-19 DIAGNOSIS — G629 Polyneuropathy, unspecified: Secondary | ICD-10-CM | POA: Diagnosis not present

## 2020-11-19 DIAGNOSIS — R0603 Acute respiratory distress: Secondary | ICD-10-CM

## 2020-11-19 DIAGNOSIS — N179 Acute kidney failure, unspecified: Secondary | ICD-10-CM | POA: Diagnosis present

## 2020-11-19 DIAGNOSIS — D869 Sarcoidosis, unspecified: Secondary | ICD-10-CM

## 2020-11-19 DIAGNOSIS — G9340 Encephalopathy, unspecified: Secondary | ICD-10-CM | POA: Diagnosis not present

## 2020-11-19 DIAGNOSIS — J9600 Acute respiratory failure, unspecified whether with hypoxia or hypercapnia: Secondary | ICD-10-CM | POA: Diagnosis present

## 2020-11-19 DIAGNOSIS — B9561 Methicillin susceptible Staphylococcus aureus infection as the cause of diseases classified elsewhere: Secondary | ICD-10-CM

## 2020-11-19 LAB — COMPREHENSIVE METABOLIC PANEL
ALT: 73 U/L — ABNORMAL HIGH (ref 0–44)
AST: 64 U/L — ABNORMAL HIGH (ref 15–41)
Albumin: 3.2 g/dL — ABNORMAL LOW (ref 3.5–5.0)
Alkaline Phosphatase: 103 U/L (ref 38–126)
Anion gap: 6 (ref 5–15)
BUN: 17 mg/dL (ref 8–23)
CO2: 30 mmol/L (ref 22–32)
Calcium: 8.4 mg/dL — ABNORMAL LOW (ref 8.9–10.3)
Chloride: 99 mmol/L (ref 98–111)
Creatinine, Ser: 2.37 mg/dL — ABNORMAL HIGH (ref 0.61–1.24)
GFR, Estimated: 30 mL/min — ABNORMAL LOW (ref >60)
Glucose, Bld: 111 mg/dL — ABNORMAL HIGH (ref 70–99)
Potassium: 4.2 mmol/L (ref 3.5–5.1)
Sodium: 135 mmol/L (ref 135–145)
Total Bilirubin: 1.4 mg/dL — ABNORMAL HIGH (ref 0.3–1.2)
Total Protein: 5.7 g/dL — ABNORMAL LOW (ref 6.5–8.1)

## 2020-11-19 LAB — BLOOD CULTURE ID PANEL (REFLEXED) - BCID2
A.calcoaceticus-baumannii: NOT DETECTED
Bacteroides fragilis: NOT DETECTED
Candida albicans: NOT DETECTED
Candida auris: NOT DETECTED
Candida glabrata: NOT DETECTED
Candida krusei: NOT DETECTED
Candida parapsilosis: NOT DETECTED
Candida tropicalis: NOT DETECTED
Cryptococcus neoformans/gattii: NOT DETECTED
Enterobacter cloacae complex: NOT DETECTED
Enterobacterales: NOT DETECTED
Enterococcus Faecium: NOT DETECTED
Enterococcus faecalis: NOT DETECTED
Escherichia coli: NOT DETECTED
Haemophilus influenzae: NOT DETECTED
Klebsiella aerogenes: NOT DETECTED
Klebsiella oxytoca: NOT DETECTED
Klebsiella pneumoniae: NOT DETECTED
Listeria monocytogenes: NOT DETECTED
Meth resistant mecA/C and MREJ: NOT DETECTED
Methicillin resistance mecA/C: DETECTED — AB
Neisseria meningitidis: NOT DETECTED
Proteus species: NOT DETECTED
Pseudomonas aeruginosa: NOT DETECTED
Salmonella species: NOT DETECTED
Serratia marcescens: NOT DETECTED
Staphylococcus aureus (BCID): DETECTED — AB
Staphylococcus epidermidis: DETECTED — AB
Staphylococcus lugdunensis: NOT DETECTED
Staphylococcus species: DETECTED — AB
Stenotrophomonas maltophilia: NOT DETECTED
Streptococcus agalactiae: NOT DETECTED
Streptococcus pneumoniae: NOT DETECTED
Streptococcus pyogenes: NOT DETECTED
Streptococcus species: NOT DETECTED

## 2020-11-19 LAB — FOLATE: Folate: 11.4 ng/mL (ref >5.9)

## 2020-11-19 LAB — ECHOCARDIOGRAM COMPLETE
AR max vel: 2.8 cm2
AV Area VTI: 2.72 cm2
AV Area mean vel: 2.83 cm2
AV Mean grad: 8 mmHg
AV Peak grad: 17.6 mmHg
Ao pk vel: 2.1 m/s
Area-P 1/2: 3.42 cm2
Height: 72 in
S' Lateral: 3.6 cm
Weight: 3809.55 oz

## 2020-11-19 LAB — CBC WITH DIFFERENTIAL/PLATELET
Abs Immature Granulocytes: 0.02 10*3/uL (ref 0.00–0.07)
Basophils Absolute: 0.1 10*3/uL (ref 0.0–0.1)
Basophils Relative: 1 %
Eosinophils Absolute: 0.2 10*3/uL (ref 0.0–0.5)
Eosinophils Relative: 3 %
HCT: 39.4 % (ref 39.0–52.0)
Hemoglobin: 12.4 g/dL — ABNORMAL LOW (ref 13.0–17.0)
Immature Granulocytes: 0 %
Lymphocytes Relative: 14 %
Lymphs Abs: 1 10*3/uL (ref 0.7–4.0)
MCH: 30.3 pg (ref 26.0–34.0)
MCHC: 31.5 g/dL (ref 30.0–36.0)
MCV: 96.3 fL (ref 80.0–100.0)
Monocytes Absolute: 0.7 10*3/uL (ref 0.1–1.0)
Monocytes Relative: 9 %
Neutro Abs: 5.1 10*3/uL (ref 1.7–7.7)
Neutrophils Relative %: 73 %
Platelets: 208 10*3/uL (ref 150–400)
RBC: 4.09 MIL/uL — ABNORMAL LOW (ref 4.22–5.81)
RDW: 14.3 % (ref 11.5–15.5)
WBC: 7 10*3/uL (ref 4.0–10.5)
nRBC: 0 % (ref 0.0–0.2)

## 2020-11-19 LAB — TROPONIN I (HIGH SENSITIVITY): Troponin I (High Sensitivity): 177 ng/L (ref <18)

## 2020-11-19 LAB — URINALYSIS, ROUTINE W REFLEX MICROSCOPIC
Bilirubin Urine: NEGATIVE
Glucose, UA: NEGATIVE mg/dL
Ketones, ur: NEGATIVE mg/dL
Leukocytes,Ua: NEGATIVE
Nitrite: NEGATIVE
Protein, ur: >=300 mg/dL — AB
RBC / HPF: >50 RBC/hpf — ABNORMAL HIGH (ref 0–5)
Specific Gravity, Urine: 1.032 — ABNORMAL HIGH (ref 1.005–1.030)
pH: 5 (ref 5.0–8.0)

## 2020-11-19 LAB — VITAMIN B12: Vitamin B-12: 374 pg/mL (ref 180–914)

## 2020-11-19 LAB — RAPID URINE DRUG SCREEN, HOSP PERFORMED
Amphetamines: NOT DETECTED
Barbiturates: NOT DETECTED
Benzodiazepines: NOT DETECTED
Cocaine: NOT DETECTED
Opiates: POSITIVE — AB
Tetrahydrocannabinol: NOT DETECTED

## 2020-11-19 LAB — CSF CELL COUNT WITH DIFFERENTIAL
RBC Count, CSF: 1 /mm3 — ABNORMAL HIGH
Tube #: 1
WBC, CSF: 1 /mm3 (ref 0–5)

## 2020-11-19 LAB — SEDIMENTATION RATE: Sed Rate: 19 mm/hr — ABNORMAL HIGH (ref 0–16)

## 2020-11-19 LAB — HEPATITIS C ANTIBODY: HCV Ab: NONREACTIVE

## 2020-11-19 LAB — CRYPTOCOCCAL ANTIGEN, CSF: Crypto Ag: NEGATIVE

## 2020-11-19 LAB — PROTEIN, CSF: Total  Protein, CSF: 27 mg/dL (ref 15–45)

## 2020-11-19 LAB — HEPATITIS B SURFACE ANTIGEN: Hepatitis B Surface Ag: NONREACTIVE

## 2020-11-19 LAB — TSH: TSH: 1.247 u[IU]/mL (ref 0.350–4.500)

## 2020-11-19 LAB — HIV ANTIBODY (ROUTINE TESTING W REFLEX): HIV Screen 4th Generation wRfx: NONREACTIVE

## 2020-11-19 LAB — AMMONIA: Ammonia: 22 umol/L (ref 9–35)

## 2020-11-19 LAB — GLUCOSE, CSF: Glucose, CSF: 90 mg/dL — ABNORMAL HIGH (ref 40–70)

## 2020-11-19 LAB — LACTIC ACID, PLASMA: Lactic Acid, Venous: 0.7 mmol/L (ref 0.5–1.9)

## 2020-11-19 LAB — HEPATITIS B CORE ANTIBODY, TOTAL: Hep B Core Total Ab: NONREACTIVE

## 2020-11-19 MED ORDER — HYDROCORTISONE NA SUCCINATE PF 100 MG IJ SOLR
50.0000 mg | Freq: Once | INTRAMUSCULAR | Status: AC
  Administered 2020-11-19: 50 mg via INTRAVENOUS
  Filled 2020-11-19: qty 2

## 2020-11-19 MED ORDER — ALBUTEROL SULFATE HFA 108 (90 BASE) MCG/ACT IN AERS: 2.0000 | INHALATION_SPRAY | RESPIRATORY_TRACT | Status: DC | PRN

## 2020-11-19 MED ORDER — HYDRALAZINE HCL 50 MG PO TABS
50.0000 mg | ORAL_TABLET | Freq: Two times a day (BID) | ORAL | Status: DC
  Administered 2020-11-19 – 2020-11-22 (×7): 50 mg via ORAL
  Filled 2020-11-19 (×7): qty 1

## 2020-11-19 MED ORDER — TECHNETIUM TO 99M ALBUMIN AGGREGATED
4.1000 | Freq: Once | INTRAVENOUS | Status: AC | PRN
  Administered 2020-11-19: 4.1 via INTRAVENOUS

## 2020-11-19 MED ORDER — ACETAMINOPHEN 325 MG PO TABS
650.0000 mg | ORAL_TABLET | Freq: Four times a day (QID) | ORAL | Status: DC | PRN
  Administered 2020-11-21 – 2020-11-22 (×3): 650 mg via ORAL
  Filled 2020-11-19 (×3): qty 2

## 2020-11-19 MED ORDER — TAMSULOSIN HCL 0.4 MG PO CAPS
0.4000 mg | ORAL_CAPSULE | Freq: Every day | ORAL | Status: DC
  Administered 2020-11-19 – 2020-11-22 (×4): 0.4 mg via ORAL
  Filled 2020-11-19 (×4): qty 1

## 2020-11-19 MED ORDER — DEXTROSE 5 % IV SOLN
10.0000 mg/kg | Freq: Two times a day (BID) | INTRAVENOUS | Status: DC
  Administered 2020-11-19: 775 mg via INTRAVENOUS
  Filled 2020-11-19 (×3): qty 15.5

## 2020-11-19 MED ORDER — DILTIAZEM HCL ER COATED BEADS 180 MG PO CP24
360.0000 mg | ORAL_CAPSULE | Freq: Every day | ORAL | Status: DC
  Administered 2020-11-19 – 2020-11-22 (×5): 360 mg via ORAL
  Filled 2020-11-19: qty 2
  Filled 2020-11-19: qty 1
  Filled 2020-11-19 (×4): qty 2

## 2020-11-19 MED ORDER — ACETAMINOPHEN 650 MG RE SUPP: 650.0000 mg | Freq: Four times a day (QID) | RECTAL | Status: DC | PRN

## 2020-11-19 MED ORDER — LIDOCAINE HCL (PF) 1 % IJ SOLN
5.0000 mL | Freq: Once | INTRAMUSCULAR | Status: AC
  Administered 2020-11-19: 5 mL via INTRADERMAL

## 2020-11-19 MED ORDER — LORAZEPAM 2 MG/ML IJ SOLN
1.0000 mg | Freq: Once | INTRAMUSCULAR | Status: AC
  Administered 2020-11-19: 1 mg via INTRAVENOUS
  Filled 2020-11-19: qty 1

## 2020-11-19 MED ORDER — PREDNISONE 5 MG PO TABS
5.0000 mg | ORAL_TABLET | Freq: Every day | ORAL | Status: DC
  Administered 2020-11-19 – 2020-11-22 (×4): 5 mg via ORAL
  Filled 2020-11-19 (×4): qty 1

## 2020-11-19 MED ORDER — ENOXAPARIN SODIUM 40 MG/0.4ML ~~LOC~~ SOLN
40.0000 mg | SUBCUTANEOUS | Status: DC
  Administered 2020-11-20 – 2020-11-21 (×2): 40 mg via SUBCUTANEOUS
  Filled 2020-11-19 (×2): qty 0.4

## 2020-11-19 MED ORDER — ATORVASTATIN CALCIUM 10 MG PO TABS
20.0000 mg | ORAL_TABLET | Freq: Every day | ORAL | Status: DC
  Administered 2020-11-19 – 2020-11-22 (×4): 20 mg via ORAL
  Filled 2020-11-19 (×4): qty 2

## 2020-11-19 MED ORDER — LABETALOL HCL 200 MG PO TABS
300.0000 mg | ORAL_TABLET | Freq: Three times a day (TID) | ORAL | Status: DC
  Administered 2020-11-19 – 2020-11-22 (×11): 300 mg via ORAL
  Filled 2020-11-19 (×12): qty 1

## 2020-11-19 MED ORDER — SODIUM CHLORIDE 0.9 % IV SOLN: INTRAVENOUS | Status: DC

## 2020-11-19 NOTE — Progress Notes (Signed)
Pharmacy Antibiotic Note  [name] is a 65 y.o. male admitted on 11/18/2020 with questionable sepsis. Neuro ruling out meningitis. Pharmacy has been consulted for cefepime and vancomycin dosing.  WBC wnl, Tm 101.3. SCr 2.2.   Scr trended up to 2.37. Acyclovir has also be ordered to r/o encephalitis. Due to its risk for causing further AKI, a maintenance fluid is recommended. Dr Bonner Puna would like to use NS @50  ml/hr due to CHF. Re-attempt for LP today.  CrCl 40 ml/min Plan: -Cefepime 2 gm IV Q 12 hours -Vancomycin 2 gm IV load followed by vancomycin 750 mg IV Q 12 hours per traditional nomogram -Ampicillin 2 gm IV Q 6 hours for listeria coverage  Acyclovir 775mg  IV q12 -Monitor CBC, renal fx, cultures and clinical progress   Height: 6' (182.9 cm) Weight: 108 kg (238 lb 1.6 oz) IBW/kg (Calculated) : 77.6  Temp (24hrs), Avg:99.7 F (37.6 C), Min:98.6 F (37 C), Max:101.3 F (38.5 C)  Recent Labs  Lab 11/18/20 1858 11/18/20 1922 11/18/20 2020 11/18/20 2350 11/19/20 0233  WBC 8.0  --   --   --  7.0  CREATININE 2.31* 2.20*  --   --  2.37*  LATICACIDVEN  --   --  1.0 0.7  --     Estimated Creatinine Clearance: 40.5 mL/min (A) (by C-G formula based on SCr of 2.37 mg/dL (H)).    Allergies  Allergen Reactions  . Codeine Nausea Only  . Hydromorphone Other (See Comments)    hallucinations     Antimicrobials this admission: Cefepime 3/15 >>  Vanc 3/15 >>  Ampicillin 3/15 >>  Acyclovir 3/16>>  Dose adjustments this admission:  Microbiology results: 3/15 BCx: ngtd 3/15 UCx: pend  3/15 CSF >>   Onnie Boer, PharmD, BCIDP, AAHIVP, CPP Infectious Disease Pharmacist 11/19/2020 10:00 AM

## 2020-11-19 NOTE — Progress Notes (Signed)
PROGRESS NOTE  Brief Narrative: [name] is a 65 y.o. male with a history of sarcoidosis on chronic prednisone 3m, stage IIIa CKD, OSA not on CPAP, HTN, and rotator cuff tear s/p repair 2 weeks prior who presented to the ED 3/15 with confusion. In the ED he was mildly hypoxic with CXR demonstrating vascular congestion. Pt was febrile to 101.49F with WBC of 8k. SCr was 2.3 (from baseline ~1.4). Neurology was consulted due to encephalopathy, recommending empiric Tx for meningitis/encephalitis pending fluoro-guided LP and MRI brain which was unremarkable. Blood cultures have grown GPC in 1 of 4. V/Q scan was negative and empiric heparin discontinued.  Subjective: Mentation is improved, though has waxed and waned over past 12 hours per wife at bedside. Pt denies current or previous HA, neck pain/stiffness. Did have chills but didn't know about fever PTA. Has had episodes of AMS per wife associated with medications, or anesthesia in the past and also with fever. Never been on oxygen and denies both chest pain and dyspnea.   Objective: BP 138/86 (BP Location: Left Arm)   Pulse 69   Temp 98.6 F (37 C)   Resp 16   Ht 6' (1.829 m)   Wt 108 kg   SpO2 91%   BMI 32.29 kg/m   Gen: Nontoxic male supine in no distress Pulm: Clear anteriorly (unable to raise after LP at this time.)  CV: RRR, no murmur, UTD JVD, no significant LE edema. Pulses normal, extremities warm and dry. GI: Soft, NT, ND, +BS  Neuro: Alert and oriented x4, does have HOH and occasional impaired recall of remote events. No focal deficits. Skin: No rashes, lesions or ulcers on visualized skin. Ecchymoses on RUE.  Assessment & Plan: Principal Problem:   Acute encephalopathy Active Problems:   Sarcoidosis   Essential hypertension   Tobacco abuse   OSA (obstructive sleep apnea)   ARF (acute renal failure) (HCC)   Acute respiratory failure (HCC)  Fever, acute encephalopathy: Presumed diagnosis is r/o  meningitis/encephalitis, also possibly metabolic with renal impairment. Folate, B12 wnl. ESR 19. TSH 1.247.  - LP pending. Studies ordered.  - MR venogram pending - EEG pending - Appreciate neurology consultation - Tx with vancomycin, ceftriaxone (neurology recommended cefepime), ampicillin (Listeria), and acyclovir. To reduce risk of crystal nephropathy start low rate of IVF.  - If AMS is waxing/waning, check ABG due to hx OSA.   Fever, bacteremia: Blood culture in 1 of 4 growing GPC w/rapid results indicating both S. epi and S. aureus with +methicillin resistance.  - ID consult pending. No change to abx currently.  Acute hypoxic respiratory failure: V/Q scan negative.  - Change heparin gtt to ppx lovenox given hx PE. To reduce risk of hemorrhage post LP, will delay initiation for 24 hours post LP. - Anticipate need to begin diuresing soon. Echocardiogram pending (previous results indicate a ?unicuspid AV but preserved LV function). BNP elevated, troponin elevated most likely due to demand ischemia as trend is flat and pt denies chest pain and no STEMI on ECG.  - Continue supplemental oxygen as needed to maintain adequate oxygenation.   AKI on stage IIIa CKD: Suspected to be due to dehydration.   Sarcoidosis:  - Continue chronic steroids. Given a dose of stress steroids.  HTN:  - Diltiazem, hydralazine, labetalol  HLD:  - Hold statin with LFT elevation.   Mild LFT elevations:  - Unclear etiology. Consider RUQ U/S if not improving  Hematuria:  - Repeat at follow up.  Otherwise, please see H&P from this AM by Dr. Hal Hope.   Patrecia Pour, MD Pager on amion 11/19/2020, 3:23 PM

## 2020-11-19 NOTE — H&P (Signed)
History and Physical    [name] XTG:[id number] DOB: Jul 13, 1956 DOA: 11/18/2020  PCP: [name], MD  Patient coming from: Home.  Chief Complaint: Altered mental status.  History obtained from patient's wife.  Neurologist.  Previous records.  ER physician.  HPI: [name] is a 65 y.o. male with history of sarcoidosis on chronic steroids, chronic kidney disease baseline creatinine around 1.4, hypertension, sleep apnea not using CPAP was found to be slightly confused around 5 PM last evening by patient's wife.  Until then patient was doing well.  Patient had a recent right rotator cuff surgery 2 weeks ago.  Has been taking some hydrocodone but the last dose was more than 2 days ago.  Patient was found to be confused not following commands and also had some difficulty talking.  Patient was not able to lift his extremities.  Was brought into the ER.  ED Course: In the ER patient was hemodynamically stable but became hypoxic soon requiring nonrebreather.  Chest x-ray showed congestion.  Patient had a fever of 101.3 with labs showing worsening renal function from 1.4 in July 2021 it is around 2.3.  CT angiogram of the head and neck did not show any acute findings.  On-call neurologist was consulted at this time neurologist recommended getting a lumbar puncture and also MRI brain but MRI brain was unremarkable.  Lumbar puncture was attempted by the ER physician but was not successful.  Patient was empirically started antibiotics for meningitis.  At the time of my exam patient became more alert awake and is able to give answers.  Covid test negative.  Patient admitted for acute encephalopathy with fever cause not clear.  Patient also became hypoxic and VQ scan is pending patient was empirically started on heparin by the ER physician since patient has had previous history of PE.  Review of Systems: As per HPI, rest all negative.   Past Medical History:  Diagnosis Date  . Back pain   . CKD  (chronic kidney disease), stage III ([place])   . CVA (cerebral infarction)   . Diastolic dysfunction   . ED (erectile dysfunction)   . GERD (gastroesophageal reflux disease)   . Hypercalcemia   . Hyperlipidemia   . Hypertension   . Insomnia   . Long-term use of high-risk medication   . Nephrocalcinosis   . Nephrolithiasis   . Obesity   . Osteopenia   . Pancreatitis   . PE (pulmonary embolism)   . Proteinuria   . Sarcoidosis   . Smoker     Past Surgical History:  Procedure Laterality Date  . ABDOMINAL EXPLORATION SURGERY    . BACK SURGERY    . CHOLECYSTECTOMY    . SHOULDER ARTHROSCOPY Right 10/30/2020   Procedure: ARTHROSCOPY SHOULDER WITH EXTENSIVE DEBRIDEMENT;  Surgeon: Mcarthur Rossetti, MD;  Location: [facility];  Service: Orthopedics;  Laterality: Right;  . SHOULDER SURGERY    . TRACHEOSTOMY     closed     reports that he has quit smoking. His smoking use included cigarettes. He has never used smokeless tobacco. He reports that he does not drink alcohol and does not use drugs.  Allergies  Allergen Reactions  . Codeine Nausea Only  . Hydromorphone Other (See Comments)    hallucinations     Family History  Problem Relation Age of Onset  . Hypertension Father   . CVA Father   . Lung cancer Father   . Alzheimer's disease Mother   .  Hypertension Sister     Prior to Admission medications   Medication Sig Start Date End Date Taking? Authorizing Provider  albuterol (PROVENTIL HFA) 108 (90 Base) MCG/ACT inhaler Inhale 2 puffs into the lungs every 4 (four) hours as needed for wheezing or shortness of breath. Needs office visit for any refills. 01/05/17   Shawnee Knapp, MD  ALPRAZolam Duanne Moron) 0.5 MG tablet Take 0.5 mg by mouth 2 (two) times daily as needed. 07/03/20   [provider]  aspirin 81 MG tablet Take 1 tablet (81 mg total) by mouth daily. 02/01/13   [name], MD  atorvastatin (LIPITOR) 20 MG tablet Take 20 mg by mouth daily. 02/02/19    [provider]  diltiazem (CARDIZEM CD) 360 MG 24 hr capsule Take 360 mg by mouth daily.    [provider]  fluticasone (FLONASE) 50 MCG/ACT nasal spray Place 2 sprays into both nostrils daily as needed for allergies or rhinitis.    [provider]  hydrALAZINE (APRESOLINE) 50 MG tablet Take 50 mg by mouth 2 (two) times a day. 03/12/19   [provider]  labetalol (NORMODYNE) 300 MG tablet Take 300 mg by mouth 3 (three) times daily.    [provider]  losartan (COZAAR) 100 MG tablet Take 100 mg by mouth daily. 01/05/19   [provider]  MELATONIN PO Take 12.5 mg by mouth at bedtime.    [provider]  ondansetron (ZOFRAN ODT) 4 MG disintegrating tablet Take 1 tablet (4 mg total) by mouth every 8 (eight) hours as needed for nausea or vomiting. 10/30/20   Mcarthur Rossetti, MD  oxyCODONE (ROXICODONE) 5 MG immediate release tablet Take 1-2 tablets (5-10 mg total) by mouth every 6 (six) hours as needed for severe pain. 11/05/20   Pete Pelt, PA-C  predniSONE (DELTASONE) 5 MG tablet Take 5 mg by mouth daily. Reported on 11/02/2015    [provider]  tamsulosin (FLOMAX) 0.4 MG CAPS capsule Take 0.4 mg by mouth daily. 11/01/18   [provider]  traZODone (DESYREL) 100 MG tablet Take 100 mg by mouth at bedtime. 06/18/19   [provider]    Physical Exam: Constitutional: Moderately built and nourished. Vitals:   11/18/20 2145 11/18/20 2200 11/18/20 2215 11/18/20 2218  BP: 129/80 130/78 127/77   Pulse: 73 72 71   Resp: (!) 21 17 (!) 21   Temp:    99.2 F (37.3 C)  TempSrc:    Oral  SpO2: 99% 99% 99%   Weight:      Height:       Eyes: Anicteric no pallor. ENMT: No discharge from the ears eyes nose or mouth. Neck: No mass felt.  No neck rigidity. Respiratory: No rhonchi or crepitations. Cardiovascular: S1-S2 heard. Abdomen: Soft nontender bowel sounds present. Musculoskeletal: No edema.  Able to  move all extremities. Skin: Recent surgical changes in the right shoulder. Neurologic: Alert awake oriented to his name and place and time moving all extremities. Psychiatric: Appears normal.  Normal affect.   Labs on Admission: I have personally reviewed following labs and imaging studies  CBC: Recent Labs  Lab 11/18/20 1858 11/18/20 1921 11/18/20 1922  WBC 8.0  --   --   NEUTROABS 5.8  --   --   HGB 13.0 13.6 13.6  HCT 39.4 40.0 40.0  MCV 93.8  --   --   PLT 244  --   --    Basic Metabolic Panel: Recent Labs  Lab 11/18/20 1858 11/18/20 1921 11/18/20 1922  NA 137 138 138  K 4.2 4.2 4.2  CL 98  --  97*  CO2 31  --   --   GLUCOSE 84  --  82  BUN 14  --  16  CREATININE 2.31*  --  2.20*  CALCIUM 8.8*  --   --    GFR: Estimated Creatinine Clearance: 43.7 mL/min (A) (by C-G formula based on SCr of 2.2 mg/dL (H)). Liver Function Tests: Recent Labs  Lab 11/18/20 1858  AST 63*  ALT 74*  ALKPHOS 105  BILITOT 1.1  PROT 6.3*  ALBUMIN 3.4*   No results for input(s): LIPASE, AMYLASE in the last 168 hours. Recent Labs  Lab 11/18/20 2350  AMMONIA 22   Coagulation Profile: Recent Labs  Lab 11/18/20 1858  INR 1.2   Cardiac Enzymes: No results for input(s): CKTOTAL, CKMB, CKMBINDEX, TROPONINI in the last 168 hours. BNP (last 3 results) No results for input(s): PROBNP in the last 8760 hours. HbA1C: No results for input(s): HGBA1C in the last 72 hours. CBG: No results for input(s): GLUCAP in the last 168 hours. Lipid Profile: No results for input(s): CHOL, HDL, LDLCALC, TRIG, CHOLHDL, LDLDIRECT in the last 72 hours. Thyroid Function Tests: No results for input(s): TSH, T4TOTAL, FREET4, T3FREE, THYROIDAB in the last 72 hours. Anemia Panel: No results for input(s): VITAMINB12, FOLATE, FERRITIN, TIBC, IRON, RETICCTPCT in the last 72 hours. Urine analysis:    Component Value Date/Time   COLORURINE YELLOW 11/18/2020 2345   APPEARANCEUR HAZY (A) 11/18/2020 2345    LABSPEC 1.032 (H) 11/18/2020 2345   PHURINE 5.0 11/18/2020 2345   GLUCOSEU NEGATIVE 11/18/2020 2345   HGBUR MODERATE (A) 11/18/2020 2345   BILIRUBINUR NEGATIVE 11/18/2020 2345   KETONESUR NEGATIVE 11/18/2020 2345   PROTEINUR >=300 (A) 11/18/2020 2345   UROBILINOGEN 0.2 11/16/2012 1004   NITRITE NEGATIVE 11/18/2020 2345   LEUKOCYTESUR NEGATIVE 11/18/2020 2345   Sepsis Labs: @LABRCNTIP (procalcitonin:4,lacticidven:4) ) Recent Results (from the past 240 hour(s))  Resp Panel by RT-PCR (Flu A&B, Covid) Nasopharyngeal Swab     Status: None   Collection Time: 11/18/20  9:25 PM   Specimen: Nasopharyngeal Swab; Nasopharyngeal(NP) swabs in vial transport medium  Result Value Ref Range Status   SARS Coronavirus 2 by RT PCR NEGATIVE NEGATIVE Final    Comment: (NOTE) SARS-CoV-2 target nucleic acids are NOT DETECTED.  The SARS-CoV-2 RNA is generally detectable in upper respiratory specimens during the acute phase of infection. The lowest concentration of SARS-CoV-2 viral copies this assay can detect is 138 copies/mL. A negative result does not preclude SARS-Cov-2 infection and should not be used as the sole basis for treatment or other patient management decisions. A negative result may occur with  improper specimen collection/handling, submission of specimen other than nasopharyngeal swab, presence of viral mutation(s) within the areas targeted by this assay, and inadequate number of viral copies(<138 copies/mL). A negative result must be combined with clinical observations, patient history, and epidemiological information. The expected result is Negative.  Fact Sheet for Patients:  EntrepreneurPulse.com.au  Fact Sheet for Healthcare Providers:  IncredibleEmployment.be  This test is no t yet approved or cleared by the [place] FDA and  has been authorized for detection and/or diagnosis of SARS-CoV-2 by FDA under an Emergency Use Authorization  (EUA). This EUA will remain  in effect (meaning this test can be used) for the duration of the COVID-19 declaration under Section 564(b)(1) of the Act, 21 U.S.C.section 360bbb-3(b)(1), unless the  authorization is terminated  or revoked sooner.       Influenza A by PCR NEGATIVE NEGATIVE Final   Influenza B by PCR NEGATIVE NEGATIVE Final    Comment: (NOTE) The Xpert Xpress SARS-CoV-2/FLU/RSV plus assay is intended as an aid in the diagnosis of influenza from Nasopharyngeal swab specimens and should not be used as a sole basis for treatment. Nasal washings and aspirates are unacceptable for Xpert Xpress SARS-CoV-2/FLU/RSV testing.  Fact Sheet for Patients: EntrepreneurPulse.com.au  Fact Sheet for Healthcare Providers: IncredibleEmployment.be  This test is not yet approved or cleared by the [place] FDA and has been authorized for detection and/or diagnosis of SARS-CoV-2 by FDA under an Emergency Use Authorization (EUA). This EUA will remain in effect (meaning this test can be used) for the duration of the COVID-19 declaration under Section 564(b)(1) of the Act, 21 U.S.C. section 360bbb-3(b)(1), unless the authorization is terminated or revoked.  Performed at [facility], [place] [address], [place], [place] [address]      Radiological Exams on Admission: DG Chest Port 1 View  Result Date: 11/18/2020 CLINICAL DATA:  Code stroke, questionable sepsis EXAM: PORTABLE CHEST 1 VIEW COMPARISON:  03/14/2019 FINDINGS: Single frontal view of the chest demonstrates marked enlargement the cardiac silhouette. There is central vascular congestion with bibasilar airspace disease and small bilateral effusions. No pneumothorax. No acute bony abnormalities. IMPRESSION: 1. Moderate congestive heart failure. Electronically Signed   By: Randa Ngo M.D.   On: 11/18/2020 19:45   CT HEAD CODE STROKE WO CONTRAST  Result Date: 11/18/2020 CLINICAL DATA:   Code stroke. Neuro deficit, acute, stroke suspected. Additional history provided: Last known well 0845. Expressive aphasia. EXAM: CT HEAD WITHOUT CONTRAST TECHNIQUE: Contiguous axial images were obtained from the base of the skull through the vertex without intravenous contrast. COMPARISON:  Brain MRI 03/14/2019. CT angiogram head/neck 03/14/2019. Noncontrast head CT 03/14/2019. FINDINGS: Brain: Cerebral volume is normal for age. Moderate patchy and ill-defined hypoattenuation within the cerebral white matter which is nonspecific, but compatible with chronic small vessel ischemic disease. There is no acute intracranial hemorrhage. No demarcated cortical infarct is identified. No extra-axial fluid collection. No evidence of intracranial mass. No midline shift. Partially empty sella turcica. Vascular: No hyperdense vessel.  Atherosclerotic calcifications. Skull: Normal. Negative for fracture or focal lesion. Sinuses/Orbits: Visualized orbits show no acute finding. Trace scattered paranasal sinus mucosal thickening. ASPECTS ([place] Stroke Program Early CT Score) - Ganglionic level infarction (caudate, lentiform nuclei, internal capsule, insula, M1-M3 cortex): 7 - Supraganglionic infarction (M4-M6 cortex): 3 Total score (0-10 with 10 being normal): 10 These results were called by telephone at the time of interpretation on 11/18/2020 at 8:13 pm to provider [facility] , who verbally acknowledged these results. IMPRESSION: No evidence of acute infarct or acute intracranial hemorrhage. ASPECTS is 10. Redemonstrated moderate cerebral white matter chronic small vessel ischemic disease. Minimal scattered paranasal sinus mucosal thickening. Electronically Signed   By: Kellie Simmering DO   On: 11/18/2020 20:14   CT ANGIO HEAD CODE STROKE  Result Date: 11/18/2020 CLINICAL DATA:  Stroke/TIA, assess extracranial arteries. Stroke/TIA, assess intracranial arteries. EXAM: CT ANGIOGRAPHY HEAD AND NECK TECHNIQUE: Multidetector  CT imaging of the head and neck was performed using the standard protocol during bolus administration of intravenous contrast. Multiplanar CT image reconstructions and MIPs were obtained to evaluate the vascular anatomy. Carotid stenosis measurements (when applicable) are obtained utilizing NASCET criteria, using the distal internal carotid diameter as the denominator. CONTRAST:  60 mL Omnipaque 350  intravenous contrast. COMPARISON:  CT angiogram head/neck 03/14/2019. FINDINGS: CTA NECK FINDINGS Aortic arch: Standard aortic branching. No hemodynamically significant innominate or proximal subclavian artery stenosis. Right carotid system: CCA and ICA patent within the neck without stenosis. Tortuosity of the mid to distal cervical ICA. Minimal calcified plaque at the carotid bifurcations bilaterally. Left carotid system: CCA and ICA patent within the neck without stenosis. Tortuosity of the mid to distal cervical ICA. Vertebral arteries: Vertebral arteries codominant and patent within the neck without stenosis. Skeleton: Cervical spondylosis with multilevel disc space narrowing, disc bulges, uncovertebral hypertrophy and facet arthrosis. No acute bony abnormality or aggressive osseous lesion. Other neck: No neck mass or cervical lymphadenopathy. Thyroid unremarkable. Upper chest: Interlobular septal thickening within the imaged lung apices. Partially imaged layering bilateral pleural effusions. 7 mm right upper lobe pulmonary nodule (series 9, image 342). Redemonstrated calcified mediastinal lymph nodes. Review of the MIP images confirms the above findings CTA HEAD FINDINGS Anterior circulation: The intracranial internal carotid arteries are patent. Mild calcified plaque within both vessels without stenosis. The M1 middle cerebral arteries are patent. No M2 proximal branch occlusion or high-grade proximal stenosis is identified. The left A1 is dominant and the right A1 is diminutive. The left ACA remains dominant with  an azygos type ACA appearance. No intracranial aneurysm is identified. Posterior circulation: The intracranial vertebral arteries are patent. The basilar artery is patent. The posterior cerebral arteries are patent. A right posterior communicating artery is present. The left posterior communicating artery is hypoplastic or absent. Venous sinuses: Within the limitations of contrast timing, no convincing thrombus. Anatomic variants: As described. Other: There is asymmetric prominence and enhancement of the left superior ophthalmic vein. No associated asymmetry of the cavernous sinuses is appreciated. Review of the MIP images confirms the above findings These results were called by telephone at the time of interpretation on 11/18/2020 at 8:40 pm to provider [facility] , who verbally acknowledged these results. IMPRESSION: CTA neck: 1. The bilateral common carotid, internal carotid and vertebral arteries are patent within the neck without stenosis. 2. Interlobular septal thickening within the imaged lung apices, nonspecific but possibly reflecting interstitial edema. Additionally, there are partially imaged layering bilateral pleural effusions. 3. 7 mm right upper lobe pulmonary nodule. Non-contrast chest CT at 6-12 months is recommended. If the nodule is stable at time of repeat CT, then future CT at 18-24 months (from today's scan) is considered optional for low-risk patients, but is recommended for high-risk patients. This recommendation follows the consensus statement: Guidelines for Management of Incidental Pulmonary Nodules Detected on CT Images: From the Fleischner Society 2017; Radiology 2017; 284:228-243. CTA head: 1. No intracranial large vessel occlusion or proximal high-grade arterial stenosis. 2. Asymmetric prominence and enhancement of the left superior ophthalmic vein, new from the prior CTA head/neck of 03/14/2019. No associated asymmetry of the cavernous sinuses is appreciated and this finding is  indeterminate in etiology. Electronically Signed   By: Kellie Simmering DO   On: 11/18/2020 20:54   CT ANGIO NECK CODE STROKE  Result Date: 11/18/2020 CLINICAL DATA:  Stroke/TIA, assess extracranial arteries. Stroke/TIA, assess intracranial arteries. EXAM: CT ANGIOGRAPHY HEAD AND NECK TECHNIQUE: Multidetector CT imaging of the head and neck was performed using the standard protocol during bolus administration of intravenous contrast. Multiplanar CT image reconstructions and MIPs were obtained to evaluate the vascular anatomy. Carotid stenosis measurements (when applicable) are obtained utilizing NASCET criteria, using the distal internal carotid diameter as the denominator. CONTRAST:  60 mL Omnipaque 350 intravenous  contrast. COMPARISON:  CT angiogram head/neck 03/14/2019. FINDINGS: CTA NECK FINDINGS Aortic arch: Standard aortic branching. No hemodynamically significant innominate or proximal subclavian artery stenosis. Right carotid system: CCA and ICA patent within the neck without stenosis. Tortuosity of the mid to distal cervical ICA. Minimal calcified plaque at the carotid bifurcations bilaterally. Left carotid system: CCA and ICA patent within the neck without stenosis. Tortuosity of the mid to distal cervical ICA. Vertebral arteries: Vertebral arteries codominant and patent within the neck without stenosis. Skeleton: Cervical spondylosis with multilevel disc space narrowing, disc bulges, uncovertebral hypertrophy and facet arthrosis. No acute bony abnormality or aggressive osseous lesion. Other neck: No neck mass or cervical lymphadenopathy. Thyroid unremarkable. Upper chest: Interlobular septal thickening within the imaged lung apices. Partially imaged layering bilateral pleural effusions. 7 mm right upper lobe pulmonary nodule (series 9, image 342). Redemonstrated calcified mediastinal lymph nodes. Review of the MIP images confirms the above findings CTA HEAD FINDINGS Anterior circulation: The intracranial  internal carotid arteries are patent. Mild calcified plaque within both vessels without stenosis. The M1 middle cerebral arteries are patent. No M2 proximal branch occlusion or high-grade proximal stenosis is identified. The left A1 is dominant and the right A1 is diminutive. The left ACA remains dominant with an azygos type ACA appearance. No intracranial aneurysm is identified. Posterior circulation: The intracranial vertebral arteries are patent. The basilar artery is patent. The posterior cerebral arteries are patent. A right posterior communicating artery is present. The left posterior communicating artery is hypoplastic or absent. Venous sinuses: Within the limitations of contrast timing, no convincing thrombus. Anatomic variants: As described. Other: There is asymmetric prominence and enhancement of the left superior ophthalmic vein. No associated asymmetry of the cavernous sinuses is appreciated. Review of the MIP images confirms the above findings These results were called by telephone at the time of interpretation on 11/18/2020 at 8:40 pm to provider [facility] , who verbally acknowledged these results. IMPRESSION: CTA neck: 1. The bilateral common carotid, internal carotid and vertebral arteries are patent within the neck without stenosis. 2. Interlobular septal thickening within the imaged lung apices, nonspecific but possibly reflecting interstitial edema. Additionally, there are partially imaged layering bilateral pleural effusions. 3. 7 mm right upper lobe pulmonary nodule. Non-contrast chest CT at 6-12 months is recommended. If the nodule is stable at time of repeat CT, then future CT at 18-24 months (from today's scan) is considered optional for low-risk patients, but is recommended for high-risk patients. This recommendation follows the consensus statement: Guidelines for Management of Incidental Pulmonary Nodules Detected on CT Images: From the Fleischner Society 2017; Radiology 2017;  284:228-243. CTA head: 1. No intracranial large vessel occlusion or proximal high-grade arterial stenosis. 2. Asymmetric prominence and enhancement of the left superior ophthalmic vein, new from the prior CTA head/neck of 03/14/2019. No associated asymmetry of the cavernous sinuses is appreciated and this finding is indeterminate in etiology. Electronically Signed   By: Kellie Simmering DO   On: 11/18/2020 20:54    EKG: Independently reviewed.  Normal sinus rhythm.  The computer is reading it as A. fib.  Assessment/Plan Principal Problem:   Acute encephalopathy Active Problems:   Sarcoidosis   Essential hypertension   Tobacco abuse   OSA (obstructive sleep apnea)   ARF (acute renal failure) (HCC)   Acute respiratory failure ([place])    1. Acute encephalopathy with fever cause not clear appreciate neurology input.  Among the broad differentials include infectious cause, metabolic, medications and also since patient has sarcoidosis  neurologist at this time is recommended getting a lumbar puncture and EEG and also TSH RPR sed rate and CRP.  MR venogram is pending.  MRI brain was unremarkable.  Patient at the time of my exam has become more alert awake.  Since patient's lumbar puncture was unsuccessful by the ER physician we will get under fluoroscopy guided. 2. Acute respiratory failure with hypoxia presently on nonrebreather.  Not in distress.  Could be possible CHF.  However since patient has had previous history of PE patient was empirically started on heparin infusion.  We will get a VQ scan. 3. Acute on chronic renal failure stage III per patient's wife patient has not been eating well recently.  Could be the cause.  Received fluid bolus in the ER.  Follow metabolic panel.  Likely causing patient's confusion. 4. Hypertension on Cardizem hydralazine and labetalol.  Follow blood pressure trends. 5. History of sarcoidosis on chronic steroids I have placed patient on 1 dose of stress dose steroids  continue prednisone.  Check ACE levels. 6. History of sleep apnea per the chart has not been using CPAP.  If patient again becomes confused may check ABG. 7. Hyperlipidemia on statins.   DVT prophylaxis: Heparin infusion. Code Status: Full code. Family Communication: Patient's wife. Disposition Plan: Home. Consults called: Neurology. Admission status: Observation.   Rise Patience MD [facility] Pager [phone].  If 7PM-7AM, please contact night-coverage [email or web address] Password [facility]  11/19/2020, 12:58 AM

## 2020-11-19 NOTE — ED Provider Notes (Signed)
.  Lumbar Puncture  Date/Time: 11/19/2020 11:27 AM Performed by: Lucrezia Starch, MD Authorized by: Lucrezia Starch, MD   Consent:    Consent obtained:  Verbal   Consent given by:  Patient and spouse   Risks, benefits, and alternatives were discussed: yes     Risks discussed:  Bleeding, headache, nerve damage, infection, pain and repeat procedure   Alternatives discussed:  No treatment, alternative treatment, delayed treatment, observation and referral Universal protocol:    Site/side marked: yes     Patient identity confirmed:  Verbally with patient Pre-procedure details:    Procedure purpose:  Diagnostic   Preparation: Patient was prepped and draped in usual sterile fashion   Anesthesia:    Anesthesia method:  Local infiltration   Local anesthetic:  Lidocaine 1% w/o epi Procedure details:    Lumbar space:  L4-L5 interspace   Patient position:  L lateral decubitus   Needle gauge:  20   Needle length (in):  2.5   Ultrasound guidance: no     Number of attempts:  2 Post-procedure details:    Procedure completion:  Tolerated well, no immediate complications Comments:     65 year old gentleman with concern for altered mental status fever.  Neurology recommending LP to rule out CNS infection.  I reviewed risks and benefits in detail with patient and wife at bedside.  Both provided verbal consent.  Patient does not have any known coagulopathy, not currently on blood thinners, CT head negative, does have prior history of lumbar fusion surgery.  Prior to procedure, discussed anticipated difficulty due to past surgical history.  Given indication, felt it was appropriate and worthwhile to proceed with LP attempt in ER.  After 2 attempts at the L4-L5 interspace were unsuccessful, terminated procedure.  Case discussed with hospitalist and requested they pursue IR guided LP as in patient.  Followed strict sterile technique, patient tolerated well, no immediate complications.      Lucrezia Starch, MD 11/19/20 1131

## 2020-11-19 NOTE — Progress Notes (Signed)
PHARMACY - PHYSICIAN COMMUNICATION CRITICAL VALUE ALERT - BLOOD CULTURE IDENTIFICATION (BCID)  [name] is an 65 y.o. male who presented to [facility] on 11/18/2020 with a chief complaint of AMS.   Assessment: Blood cultures growing GPCs in 1/4 bottles. BCID reporting Staph aureus, Staph epi, and mecA/C resistance detected. Patient currently on antibiotics for meningitis rule out. ID service to follow-up soon.   Name of physician (or Provider) Contacted: Vance Gather, MD  Current antibiotics: ampicillin 2g IV q6h, cefepime 2g IV q12h, vancomycin 750mg  IV q12h, acyclovir 10 mg/kg q12h  Changes to prescribed antibiotics recommended:  No changes to regimen at this time pending ID workup and CSF/Culture results.   Results for orders placed or performed during the hospital encounter of 11/18/20  Blood Culture ID Panel (Reflexed) (Collected: 11/18/2020  6:59 PM)  Result Value Ref Range   Enterococcus faecalis NOT DETECTED NOT DETECTED   Enterococcus Faecium NOT DETECTED NOT DETECTED   Listeria monocytogenes NOT DETECTED NOT DETECTED   Staphylococcus species DETECTED (A) NOT DETECTED   Staphylococcus aureus (BCID) DETECTED (A) NOT DETECTED   Staphylococcus epidermidis DETECTED (A) NOT DETECTED   Staphylococcus lugdunensis NOT DETECTED NOT DETECTED   Streptococcus species NOT DETECTED NOT DETECTED   Streptococcus agalactiae NOT DETECTED NOT DETECTED   Streptococcus pneumoniae NOT DETECTED NOT DETECTED   Streptococcus pyogenes NOT DETECTED NOT DETECTED   A.calcoaceticus-baumannii NOT DETECTED NOT DETECTED   Bacteroides fragilis NOT DETECTED NOT DETECTED   Enterobacterales NOT DETECTED NOT DETECTED   Enterobacter cloacae complex NOT DETECTED NOT DETECTED   Escherichia coli NOT DETECTED NOT DETECTED   Klebsiella aerogenes NOT DETECTED NOT DETECTED   Klebsiella oxytoca NOT DETECTED NOT DETECTED   Klebsiella pneumoniae NOT DETECTED NOT DETECTED   Proteus species NOT DETECTED NOT DETECTED    Salmonella species NOT DETECTED NOT DETECTED   Serratia marcescens NOT DETECTED NOT DETECTED   Haemophilus influenzae NOT DETECTED NOT DETECTED   Neisseria meningitidis NOT DETECTED NOT DETECTED   Pseudomonas aeruginosa NOT DETECTED NOT DETECTED   Stenotrophomonas maltophilia NOT DETECTED NOT DETECTED   Candida albicans NOT DETECTED NOT DETECTED   Candida auris NOT DETECTED NOT DETECTED   Candida glabrata NOT DETECTED NOT DETECTED   Candida krusei NOT DETECTED NOT DETECTED   Candida parapsilosis NOT DETECTED NOT DETECTED   Candida tropicalis NOT DETECTED NOT DETECTED   Cryptococcus neoformans/gattii NOT DETECTED NOT DETECTED   Methicillin resistance mecA/C DETECTED (A) NOT DETECTED   Meth resistant mecA/C and MREJ NOT DETECTED NOT DETECTED    Claudina Lick, PharmD PGY1 Acute Care Pharmacy Resident 11/19/2020 2:30 PM  Please check [email or web address] for unit-specific pharmacy phone numbers.

## 2020-11-19 NOTE — Progress Notes (Signed)
[facility] for Heparin  Indication: Rule out PE  Allergies  Allergen Reactions  . Codeine Nausea Only  . Hydromorphone Other (See Comments)    hallucinations     Patient Measurements: Height: 6' (182.9 cm) Weight: 108 kg (238 lb 1.6 oz) IBW/kg (Calculated) : 77.6  Vital Signs: Temp: 98.6 F (37 C) (03/16 0904) BP: 138/86 (03/16 1352) Pulse Rate: 69 (03/16 1352)  Labs: Recent Labs    11/18/20 1858 11/18/20 1921 11/18/20 1922 11/18/20 1952 11/18/20 2350 11/19/20 0233  HGB 13.0 13.6 13.6  --   --  12.4*  HCT 39.4 40.0 40.0  --   --  39.4  PLT 244  --   --   --   --  208  APTT 34  --   --   --   --   --   LABPROT 14.3  --   --   --   --   --   INR 1.2  --   --   --   --   --   CREATININE 2.31*  --  2.20*  --   --  2.37*  TROPONINIHS  --   --   --  178* 177*  --     Estimated Creatinine Clearance: 40.5 mL/min (A) (by C-G formula based on SCr of 2.37 mg/dL (H)).   Medical History: Past Medical History:  Diagnosis Date  . Back pain   . CKD (chronic kidney disease), stage III ([place])   . CVA (cerebral infarction)   . Diastolic dysfunction   . ED (erectile dysfunction)   . GERD (gastroesophageal reflux disease)   . Hypercalcemia   . Hyperlipidemia   . Hypertension   . Insomnia   . Long-term use of high-risk medication   . Nephrocalcinosis   . Nephrolithiasis   . Obesity   . Osteopenia   . Pancreatitis   . PE (pulmonary embolism)   . Proteinuria   . Sarcoidosis   . Smoker     Assessment: 65 y/o M who presented to the ED with altered mental status. Recent shoulder surgery. Pt has hx of PE but no longer on anti-coagulation. Mild troponin elevated. Starting heparin for rule out PE until VQ scan can be completed. CBC good, noted renal dysfunction.   V/Q scan negative. Heparin drip d/c'd per Dr. Bonner Puna. Pharmacy consulted to start Lovenox for VTE prophylaxis to start 24hrs post-LP.  Goal of Therapy:  Heparin level 0.3-0.7  units/ml Monitor platelets by anticoagulation protocol: Yes   Plan:  D/c heparin drip >> start Lovenox 40mg  [place] q12h on 3/17 at 1200 (24hrs post-LP) Monitor CBC, SCr, s/sx bleeding Pharmacy will s/o consult and monitor peripherally    Arturo Morton, PharmD, BCPS Please check AMION for all [facility] contact numbers Clinical Pharmacist 11/19/2020 3:51 PM

## 2020-11-19 NOTE — ED Notes (Signed)
Patient transported to MRI by nurse and TSS. Pt able to tolerate procedure with staff reassurance.

## 2020-11-19 NOTE — Plan of Care (Signed)
  Problem: Clinical Measurements: Goal: Respiratory complications will improve Outcome: Progressing   Problem: Clinical Measurements: Goal: Cardiovascular complication will be avoided Outcome: Progressing   Problem: Safety: Goal: Ability to remain free from injury will improve Outcome: Progressing   

## 2020-11-19 NOTE — Progress Notes (Signed)
Pt has MRI scheduled, initially refused due to difficult time in MRI last night. Pt is claustrophobic. He is willing to attempt, if MD can prescribe meds to help him relax.  MD paged and MRI updated.

## 2020-11-19 NOTE — Progress Notes (Signed)
  Echocardiogram 2D Echocardiogram has been performed.  [name] F 11/19/2020, 5:14 PM

## 2020-11-19 NOTE — Consult Note (Signed)
[facility]    Date of Admission:  11/18/2020     Reason for Consult: Staph aureus auto consult     Referring Physician: Automatic consult  Current antibiotics: Day 2 ampicillin, cefepime, vancomycin (11/18/2020--present) Day 1 acyclovir (11/19/20--present)   Principal Problem:   Gram-positive bacteremia Active Problems:   Sarcoidosis   Essential hypertension   Tobacco abuse   OSA (obstructive sleep apnea)   Acute encephalopathy   ARF (acute renal failure) (HCC)   Acute respiratory failure (HCC)   ASSESSMENT:    Gram-positive cocci in clusters positive blood culture: BC ID with staph aureus and staph epidermidis in 1 out of 4 cultures with methicillin resistance detected. Fevers, encephalopathy: Status post lumbar puncture today with opening pressure 46.5. Acute kidney injury: Baseline creatinine approximately 1.4 and  presented with creatinine 2.3. Transaminitis Sarcoidosis: On prednisone 5 mg chronically Pulmonary nodule: 7 mm right upper lobe pulmonary nodule.  Follow-up CT recommended. Pulmonary edema: Admission chest x-ray with moderate CHF and BNP greater than 1000.  Echo in 2020 with LVEF 55 to 60%. Recent shoulder surgery:  Surgical site does not have any concerning findings currently   Unclear etiology of overall presentation and what is related vs unrelated from an infection standpoint.  Blood cultures from admission with multiple organisms in 1 of 4 cultures including Staph aureus and Staph epi.  Would not consider Staph aureus a contaminant in almost any circumstance, but makes me wonder about the sterility of culture obtained.  His history of sarcoid and chronic prednisone broadens differential as to etiology of his encephalopathy.  The increased opening pressure raises suspicion for Crypto although patient had no prodromal symptoms of headaches and his encephalopathy seems to have improved dramatically in a short period of time.  His  pCO2 was quite high but pH not that low which makes me think of a more chronic process and not acute but perhaps he had an aspiration event while altered.  His right shoulder surgical site does not look concerning.  The results of his CSF studies will be informative.  ? Some sort of neurosarcoidosis process.  PLAN:    Await CSF studies.  Add on Crypto Ag and HSV PCR CSF cell count with diff, protein, glucose, cytology Will order serum Crypto Ag as well Echo Repeat blood cultures Hepatitis serologies I think imaging of his shoulder can be deferred for now Continue broad spectrum antibiotics at renal dosing for now.  Appreciate pharmacy assistance Appreciate neurology assistance as well for further thoughts on CSF studies  HPI:    ANTHEM FRAZER is a 65 y.o. male with past medical history of sarcoidosis (chronic prednisone 5 mg daily), CKD stage III, history of PE, hypertension, hyperlipidemia, osteopenia, OSA who presented last evening with encephalopathy and fevers.    He was found to be confused around 5 PM the evening prior to admission by his wife.  Prior to that point his wife reported an episode of confusion as well on Saturday when getting smoothies and daughter thought he was more tired than normal over the weekend.   Patient also endorsed some SOB and wheezing but no other localizing symptoms.  No fevers, chills, neck pain, headaches, new joint pains, or rash.  He subsequently was found to be more confused later last night and not following commands with difficulty talking.  This prompted him to be brought to the emergency room where he was noted to be hemodynamically stable but became more hypoxic requiring  nonrebreather.  Chest x-ray showed vascular congestion and he was febrile to 101.3.  Lab work-up revealed normal WBC and worsened renal function with mildly elevated LFTs.  BNP 1079.  Venous blood gas with PCO2 71.6, pH 7.27.  CTA of the head and neck did not show any acute findings.   On-call neurology was consulted who recommended lumbar puncture and MRI brain which was unremarkable.  LP was attempted by ER provider but unsuccessful.  He was started empirically on vancomycin, cefepime, ampicillin.  He was also started on heparin pending rule out for pulmonary embolism.  Patient subsequently improved and at the time of admission H&P was found to be alert, awake, oriented to name and place and able to move all extremities.  He had no nuchal rigidity.    Patient's admission blood cultures are positive in 1 out of 4 bottles for gram-positive cocci in clusters.  BC ID has identified staph aureus and staph epidermidis with methicillin resistance detected.  He underwent lumbar puncture this afternoon with an elevated opening pressure of 46.5 with clear CSF.  He denies any history of IVDU, has no prosthetic joints or valves, and no indwelling central lines.   Of note, He had a recent right shoulder arthroscopy with extensive debridement and found to have unrepairable rotator cuff tear with Dr. Ninfa Linden on 10/30/2020 and had been taking hydrocodone postoperatively but nothing in the last 2 to 3 days or so.  Seen on 11/05/20 for post-op visit and no concerns for infection noted at that time.       Past Medical History:  Diagnosis Date   Back pain    CKD (chronic kidney disease), stage III (HCC)    CVA (cerebral infarction)    Diastolic dysfunction    ED (erectile dysfunction)    GERD (gastroesophageal reflux disease)    Hypercalcemia    Hyperlipidemia    Hypertension    Insomnia    Long-term use of high-risk medication    Nephrocalcinosis    Nephrolithiasis    Obesity    Osteopenia    Pancreatitis    PE (pulmonary embolism)    Proteinuria    Sarcoidosis    Smoker     Social History   Tobacco Use   Smoking status: Former Smoker    Types: Cigarettes   Smokeless tobacco: Never Used   Tobacco comment: quit less than five years ago  Substance Use  Topics   Alcohol use: No    Alcohol/week: 0.0 standard drinks   Drug use: No    Family History  Problem Relation Age of Onset   Hypertension Father    CVA Father    Lung cancer Father    Alzheimer's disease Mother    Hypertension Sister     Allergies  Allergen Reactions   Codeine Nausea Only   Hydromorphone Other (See Comments)    hallucinations     Review of Systems  Constitutional: Positive for fever. Negative for chills, malaise/fatigue and weight loss.  HENT: Negative for congestion and sore throat.   Eyes: Negative.   Respiratory: Positive for shortness of breath and wheezing. Negative for cough and sputum production.   Cardiovascular: Negative for chest pain and PND.  Gastrointestinal: Negative.   Genitourinary: Negative.   Musculoskeletal: Positive for joint pain (improving Right shoulder pain). Negative for back pain and neck pain.  Skin: Negative for rash.  Neurological: Negative for weakness and headaches.       + confusion  Psychiatric/Behavioral: Negative.   All  other systems reviewed and are negative.   OBJECTIVE:   Blood pressure 138/86, pulse 69, temperature 98.6 F (37 C), resp. rate 16, height 6' (1.829 m), weight 108 kg, SpO2 91 %. Body mass index is 32.29 kg/m.  Physical Exam Constitutional:      General: He is not in acute distress.    Appearance: He is not ill-appearing.  HENT:     Head: Normocephalic and atraumatic.     Mouth/Throat:     Mouth: Mucous membranes are moist.     Pharynx: Oropharynx is clear.  Eyes:     Extraocular Movements: Extraocular movements intact.     Conjunctiva/sclera: Conjunctivae normal.  Cardiovascular:     Rate and Rhythm: Normal rate and regular rhythm.     Heart sounds: No murmur heard.   Pulmonary:     Effort: Pulmonary effort is normal.     Comments: Normal effort on nasal cannula with O2 sats in the upper 80s.  Breath sounds with bilateral crackles Abdominal:     General: There is no  distension.     Palpations: Abdomen is soft.     Tenderness: There is no abdominal tenderness.  Musculoskeletal:     Cervical back: Normal range of motion and neck supple. No rigidity.     Comments: Right shoulder incision clean/dry.  No erythema or swelling.  No tenderness.   Skin:    General: Skin is warm and dry.     Findings: Bruising present.  Neurological:     General: No focal deficit present.     Mental Status: He is alert and oriented to person, place, and time.     Cranial Nerves: No cranial nerve deficit.  Psychiatric:        Mood and Affect: Mood normal.        Behavior: Behavior normal.      Lab Results: Lab Results  Component Value Date   WBC 7.0 11/19/2020   HGB 12.4 (L) 11/19/2020   HCT 39.4 11/19/2020   MCV 96.3 11/19/2020   PLT 208 11/19/2020    Lab Results  Component Value Date   NA 135 11/19/2020   K 4.2 11/19/2020   CO2 30 11/19/2020   GLUCOSE 111 (H) 11/19/2020   BUN 17 11/19/2020   CREATININE 2.37 (H) 11/19/2020   CALCIUM 8.4 (L) 11/19/2020   GFRNONAA 30 (L) 11/19/2020   GFRAA 58 (L) 03/14/2019    Lab Results  Component Value Date   ALT 73 (H) 11/19/2020   AST 64 (H) 11/19/2020   ALKPHOS 103 11/19/2020   BILITOT 1.4 (H) 11/19/2020    No results found for: CRP     Component Value Date/Time   ESRSEDRATE 19 (H) 11/18/2020 2350    I have reviewed the micro and lab results in Epic.  Imaging: MR BRAIN WO CONTRAST  Result Date: 11/19/2020 CLINICAL DATA:  Altered mental status.  Fever EXAM: MRI HEAD WITHOUT CONTRAST TECHNIQUE: Multiplanar, multiecho pulse sequences of the brain and surrounding structures were obtained without intravenous contrast. COMPARISON:  Head CT and CTA from yesterday FINDINGS: Brain: No acute infarction, hemorrhage, hydrocephalus, extra-axial collection or mass lesion. Moderate for age FLAIR hyperintensity in the deep white matter attributed to chronic small vessel disease given medical history. Brain volume is  normal. Choroid fissure cyst on the right, incidental. Remote micro hemorrhages in the right thalamus, usually hypertensive Vascular: Normal flow voids. Skull and upper cervical spine: No focal marrow lesion Sinuses/Orbits: The left superior ophthalmic vein was highlighted  on prior. Intermittently these vessels appear distended which is usually from Valsalva. No asymmetry of these veins or the cavernous sinus region. IMPRESSION: 1. No emergent finding. 2. Moderate chronic small vessel ischemia. Electronically Signed   By: Monte Fantasia M.D.   On: 11/19/2020 05:15   NM Pulmonary Perfusion  Result Date: 11/19/2020 CLINICAL DATA:  Shortness of breath EXAM: NUCLEAR MEDICINE PERFUSION LUNG SCAN TECHNIQUE: Perfusion images were obtained in multiple projections after intravenous injection of radiopharmaceutical. Views: Anterior, posterior, left lateral, right lateral, RPO, LPO, RAO, LAO. RADIOPHARMACEUTICALS:  4.1 mCi Tc-66m MAA IV COMPARISON:  Chest radiograph November 18, 2020. FINDINGS: Radiotracer uptake is homogeneous and symmetric bilaterally. No perfusion defects evident. IMPRESSION: No perfusion defects evident. No findings indicative of pulmonary embolus. Electronically Signed   By: Lowella Grip III M.D.   On: 11/19/2020 13:04   DG Chest Port 1 View  Result Date: 11/18/2020 CLINICAL DATA:  Code stroke, questionable sepsis EXAM: PORTABLE CHEST 1 VIEW COMPARISON:  03/14/2019 FINDINGS: Single frontal view of the chest demonstrates marked enlargement the cardiac silhouette. There is central vascular congestion with bibasilar airspace disease and small bilateral effusions. No pneumothorax. No acute bony abnormalities. IMPRESSION: 1. Moderate congestive heart failure. Electronically Signed   By: Randa Ngo M.D.   On: 11/18/2020 19:45   CT HEAD CODE STROKE WO CONTRAST  Result Date: 11/18/2020 CLINICAL DATA:  Code stroke. Neuro deficit, acute, stroke suspected. Additional history provided: Last known  well 0845. Expressive aphasia. EXAM: CT HEAD WITHOUT CONTRAST TECHNIQUE: Contiguous axial images were obtained from the base of the skull through the vertex without intravenous contrast. COMPARISON:  Brain MRI 03/14/2019. CT angiogram head/neck 03/14/2019. Noncontrast head CT 03/14/2019. FINDINGS: Brain: Cerebral volume is normal for age. Moderate patchy and ill-defined hypoattenuation within the cerebral white matter which is nonspecific, but compatible with chronic small vessel ischemic disease. There is no acute intracranial hemorrhage. No demarcated cortical infarct is identified. No extra-axial fluid collection. No evidence of intracranial mass. No midline shift. Partially empty sella turcica. Vascular: No hyperdense vessel.  Atherosclerotic calcifications. Skull: Normal. Negative for fracture or focal lesion. Sinuses/Orbits: Visualized orbits show no acute finding. Trace scattered paranasal sinus mucosal thickening. ASPECTS ([facility] Stroke Program Early CT Score) - Ganglionic level infarction (caudate, lentiform nuclei, internal capsule, insula, M1-M3 cortex): 7 - Supraganglionic infarction (M4-M6 cortex): 3 Total score (0-10 with 10 being normal): 10 These results were called by telephone at the time of interpretation on 11/18/2020 at 8:13 pm to provider [facility] , who verbally acknowledged these results. IMPRESSION: No evidence of acute infarct or acute intracranial hemorrhage. ASPECTS is 10. Redemonstrated moderate cerebral white matter chronic small vessel ischemic disease. Minimal scattered paranasal sinus mucosal thickening. Electronically Signed   By: Kellie Simmering DO   On: 11/18/2020 20:14   CT ANGIO HEAD CODE STROKE  Result Date: 11/18/2020 CLINICAL DATA:  Stroke/TIA, assess extracranial arteries. Stroke/TIA, assess intracranial arteries. EXAM: CT ANGIOGRAPHY HEAD AND NECK TECHNIQUE: Multidetector CT imaging of the head and neck was performed using the standard protocol during bolus  administration of intravenous contrast. Multiplanar CT image reconstructions and MIPs were obtained to evaluate the vascular anatomy. Carotid stenosis measurements (when applicable) are obtained utilizing NASCET criteria, using the distal internal carotid diameter as the denominator. CONTRAST:  60 mL Omnipaque 350 intravenous contrast. COMPARISON:  CT angiogram head/neck 03/14/2019. FINDINGS: CTA NECK FINDINGS Aortic arch: Standard aortic branching. No hemodynamically significant innominate or proximal subclavian artery stenosis. Right carotid system: CCA and ICA patent within  the neck without stenosis. Tortuosity of the mid to distal cervical ICA. Minimal calcified plaque at the carotid bifurcations bilaterally. Left carotid system: CCA and ICA patent within the neck without stenosis. Tortuosity of the mid to distal cervical ICA. Vertebral arteries: Vertebral arteries codominant and patent within the neck without stenosis. Skeleton: Cervical spondylosis with multilevel disc space narrowing, disc bulges, uncovertebral hypertrophy and facet arthrosis. No acute bony abnormality or aggressive osseous lesion. Other neck: No neck mass or cervical lymphadenopathy. Thyroid unremarkable. Upper chest: Interlobular septal thickening within the imaged lung apices. Partially imaged layering bilateral pleural effusions. 7 mm right upper lobe pulmonary nodule (series 9, image 342). Redemonstrated calcified mediastinal lymph nodes. Review of the MIP images confirms the above findings CTA HEAD FINDINGS Anterior circulation: The intracranial internal carotid arteries are patent. Mild calcified plaque within both vessels without stenosis. The M1 middle cerebral arteries are patent. No M2 proximal branch occlusion or high-grade proximal stenosis is identified. The left A1 is dominant and the right A1 is diminutive. The left ACA remains dominant with an azygos type ACA appearance. No intracranial aneurysm is identified. Posterior  circulation: The intracranial vertebral arteries are patent. The basilar artery is patent. The posterior cerebral arteries are patent. A right posterior communicating artery is present. The left posterior communicating artery is hypoplastic or absent. Venous sinuses: Within the limitations of contrast timing, no convincing thrombus. Anatomic variants: As described. Other: There is asymmetric prominence and enhancement of the left superior ophthalmic vein. No associated asymmetry of the cavernous sinuses is appreciated. Review of the MIP images confirms the above findings These results were called by telephone at the time of interpretation on 11/18/2020 at 8:40 pm to provider [facility] , who verbally acknowledged these results. IMPRESSION: CTA neck: 1. The bilateral common carotid, internal carotid and vertebral arteries are patent within the neck without stenosis. 2. Interlobular septal thickening within the imaged lung apices, nonspecific but possibly reflecting interstitial edema. Additionally, there are partially imaged layering bilateral pleural effusions. 3. 7 mm right upper lobe pulmonary nodule. Non-contrast chest CT at 6-12 months is recommended. If the nodule is stable at time of repeat CT, then future CT at 18-24 months (from today's scan) is considered optional for low-risk patients, but is recommended for high-risk patients. This recommendation follows the consensus statement: Guidelines for Management of Incidental Pulmonary Nodules Detected on CT Images: From the Fleischner Society 2017; Radiology 2017; 284:228-243. CTA head: 1. No intracranial large vessel occlusion or proximal high-grade arterial stenosis. 2. Asymmetric prominence and enhancement of the left superior ophthalmic vein, new from the prior CTA head/neck of 03/14/2019. No associated asymmetry of the cavernous sinuses is appreciated and this finding is indeterminate in etiology. Electronically Signed   By: Kellie Simmering DO   On:  11/18/2020 20:54   CT ANGIO NECK CODE STROKE  Result Date: 11/18/2020 CLINICAL DATA:  Stroke/TIA, assess extracranial arteries. Stroke/TIA, assess intracranial arteries. EXAM: CT ANGIOGRAPHY HEAD AND NECK TECHNIQUE: Multidetector CT imaging of the head and neck was performed using the standard protocol during bolus administration of intravenous contrast. Multiplanar CT image reconstructions and MIPs were obtained to evaluate the vascular anatomy. Carotid stenosis measurements (when applicable) are obtained utilizing NASCET criteria, using the distal internal carotid diameter as the denominator. CONTRAST:  60 mL Omnipaque 350 intravenous contrast. COMPARISON:  CT angiogram head/neck 03/14/2019. FINDINGS: CTA NECK FINDINGS Aortic arch: Standard aortic branching. No hemodynamically significant innominate or proximal subclavian artery stenosis. Right carotid system: CCA and ICA patent within the  neck without stenosis. Tortuosity of the mid to distal cervical ICA. Minimal calcified plaque at the carotid bifurcations bilaterally. Left carotid system: CCA and ICA patent within the neck without stenosis. Tortuosity of the mid to distal cervical ICA. Vertebral arteries: Vertebral arteries codominant and patent within the neck without stenosis. Skeleton: Cervical spondylosis with multilevel disc space narrowing, disc bulges, uncovertebral hypertrophy and facet arthrosis. No acute bony abnormality or aggressive osseous lesion. Other neck: No neck mass or cervical lymphadenopathy. Thyroid unremarkable. Upper chest: Interlobular septal thickening within the imaged lung apices. Partially imaged layering bilateral pleural effusions. 7 mm right upper lobe pulmonary nodule (series 9, image 342). Redemonstrated calcified mediastinal lymph nodes. Review of the MIP images confirms the above findings CTA HEAD FINDINGS Anterior circulation: The intracranial internal carotid arteries are patent. Mild calcified plaque within both  vessels without stenosis. The M1 middle cerebral arteries are patent. No M2 proximal branch occlusion or high-grade proximal stenosis is identified. The left A1 is dominant and the right A1 is diminutive. The left ACA remains dominant with an azygos type ACA appearance. No intracranial aneurysm is identified. Posterior circulation: The intracranial vertebral arteries are patent. The basilar artery is patent. The posterior cerebral arteries are patent. A right posterior communicating artery is present. The left posterior communicating artery is hypoplastic or absent. Venous sinuses: Within the limitations of contrast timing, no convincing thrombus. Anatomic variants: As described. Other: There is asymmetric prominence and enhancement of the left superior ophthalmic vein. No associated asymmetry of the cavernous sinuses is appreciated. Review of the MIP images confirms the above findings These results were called by telephone at the time of interpretation on 11/18/2020 at 8:40 pm to provider [facility] , who verbally acknowledged these results. IMPRESSION: CTA neck: 1. The bilateral common carotid, internal carotid and vertebral arteries are patent within the neck without stenosis. 2. Interlobular septal thickening within the imaged lung apices, nonspecific but possibly reflecting interstitial edema. Additionally, there are partially imaged layering bilateral pleural effusions. 3. 7 mm right upper lobe pulmonary nodule. Non-contrast chest CT at 6-12 months is recommended. If the nodule is stable at time of repeat CT, then future CT at 18-24 months (from today's scan) is considered optional for low-risk patients, but is recommended for high-risk patients. This recommendation follows the consensus statement: Guidelines for Management of Incidental Pulmonary Nodules Detected on CT Images: From the Fleischner Society 2017; Radiology 2017; 284:228-243. CTA head: 1. No intracranial large vessel occlusion or proximal  high-grade arterial stenosis. 2. Asymmetric prominence and enhancement of the left superior ophthalmic vein, new from the prior CTA head/neck of 03/14/2019. No associated asymmetry of the cavernous sinuses is appreciated and this finding is indeterminate in etiology. Electronically Signed   By: Kellie Simmering DO   On: 11/18/2020 20:54   DG FL GUIDED LUMBAR PUNCTURE  Result Date: 11/19/2020 CLINICAL DATA:  Acute encephalopathy EXAM: DIAGNOSTIC LUMBAR PUNCTURE UNDER FLUOROSCOPIC GUIDANCE COMPARISON:  MRI head 11/19/2020.  MRI lumbar spine 12/18/2014 FLUOROSCOPY TIME:  Fluoroscopy Time:  0 minutes 6 seconds Radiation Exposure Index (if provided by the fluoroscopic device): Number of Acquired Spot Images: 1 PROCEDURE: Informed consent was obtained from the patient's wife prior to the procedure, including potential complications of headache, allergy, and pain. With the patient prone, the lower back was prepped with Betadine. 1% Lidocaine was used for local anesthesia. Lumbar puncture was performed at the L4-5 level using a 22 gauge needle with return of clear CSF with an opening pressure of 46.5 cm water.  11 ml of CSF were obtained for laboratory studies. The patient tolerated the procedure well and there were no apparent complications. IMPRESSION: Successful lumbar puncture. Elevated opening pressure 46.5 cm water. Clear CSF. Electronically Signed   By: Franchot Gallo M.D.   On: 11/19/2020 13:38     Imaging  independently reviewed in Epic.  [facility] [phone] pager 11/19/2020, 3:23 PM  I spent greater than 110 minutes with the patient including greater than 50% of time in face to face counsel of the patient and in coordination of their care.

## 2020-11-19 NOTE — Progress Notes (Signed)
Neurology F/u Note  Subjective: Mental status significantly improved, fully-oriented on my exam. Wife at bedside states that he was 30% of baseline cognitive function on admission and is currently 90%. Neurologic exam nonfocal except for impaired movement of L forehead accompanied by ?mild L periorbital edema.   Interval data: LP: OP elevated at 46   Exam: Vitals:   11/19/20 0904 11/19/20 1352  BP: (!) 147/94 138/86  Pulse: 67 69  Resp: 18 16  Temp: 98.6 F (37 C)   SpO2: 100% 91%   Gen: In bed, NAD Resp: non-labored breathing, no acute distress Abd: soft, nt  Neuro: MS: A&Ox4 CN: PERRL, EOMI, sensation intact, impaired forehead elevation L, hearing intact to voice, tongue midline Motor: 5/5 strength throughout Sensory:SILT DTR: 2+ symmetric throughout, toes down  Pertinent Labs:  ESR 19 TSH 1.247 B12 374 HIV NR  LP 3/16 RBC 1 WBC 1 Protein 27 Gluc 9 Gram stain: WBC predom mononuclear, no org Cx pending Crypto antigen neg HSV pending  CTA showed enhancement L supraorbital vein   MRI brain wo contrast 3/16  EXAM: MRI HEAD WITHOUT CONTRAST  TECHNIQUE: Multiplanar, multiecho pulse sequences of the brain and surrounding structures were obtained without intravenous contrast.  COMPARISON:  Head CT and CTA from yesterday  FINDINGS: Brain: No acute infarction, hemorrhage, hydrocephalus, extra-axial collection or mass lesion.  Moderate for age FLAIR hyperintensity in the deep white matter attributed to chronic small vessel disease given medical history. Brain volume is normal. Choroid fissure cyst on the right, incidental. Remote micro hemorrhages in the right thalamus, usually hypertensive  Vascular: Normal flow voids.  Skull and upper cervical spine: No focal marrow lesion  Sinuses/Orbits: The left superior ophthalmic vein was highlighted on prior. Intermittently these vessels appear distended which is usually from Valsalva. No asymmetry of  these veins or the cavernous sinus region.  IMPRESSION: 1. No emergent finding. 2. Moderate chronic small vessel ischemia.  CNS imaging personally reviewed. Agree with above findings.  Impression: 54M hx of sarcoidosis on steroids p/w lethargy x 2-3 days + several hours of L upper facial droop, fever and aphasia out of proportion to AMS. Encephalopathy now vastly improved without intervention. Neurologic exam remains significant for impaired forehead and periorbital facial movements on L. Has enhancement of L supraorbital vein on CTA. MRI wo contrast (2/2 AKI) showed no significant findings. LP w/ highly elevated OP recorded (46) but reassuring initial CSF labs including no pleiocytosis.  Recommendations: 1) F/u HSV. If negative from neurology standpoint does not need acyclovir, will defer to ID. 2) LP 3/16 w/ WBC 1: from neurology standpoint he does not need empiric CNS coverage for bacterial meningitis. Will defer to ID regarding utility of continuing abx for other indications 3) Reordered MRV r/o CSVT contributing to elevated OP 4) CSF neg crypto antigen. F/u crypto antigen blood ordered by ID. 5) Ophtho c/s AM eval L orbit abnl on exam and imaging + eval for papilledema. Note CTA showed enhancement L supraorbital vein 3/15. 6) Appreciate ID input per above  Will continue to follow.  Su Monks, MD [facility] [phone]  If 7pm- 7am, please page neurology on call as listed in [place].

## 2020-11-19 NOTE — Progress Notes (Signed)
ANTICOAGULATION CONSULT NOTE - Initial Consult  Pharmacy Consult for Heparin  Indication: Rule out PE  Allergies  Allergen Reactions  . Codeine Nausea Only  . Hydromorphone Other (See Comments)    hallucinations     Patient Measurements: Height: 6' (182.9 cm) Weight: 108 kg (238 lb 1.6 oz) IBW/kg (Calculated) : 77.6  Vital Signs: Temp: 99.2 F (37.3 C) (03/15 2218) Temp Source: Oral (03/15 2218) BP: 127/77 (03/15 2215) Pulse Rate: 71 (03/15 2215)  Labs: Recent Labs    11/18/20 1858 11/18/20 1921 11/18/20 1922 11/18/20 1952 11/18/20 2350  HGB 13.0 13.6 13.6  --   --   HCT 39.4 40.0 40.0  --   --   PLT 244  --   --   --   --   APTT 34  --   --   --   --   LABPROT 14.3  --   --   --   --   INR 1.2  --   --   --   --   CREATININE 2.31*  --  2.20*  --   --   TROPONINIHS  --   --   --  178* 177*    Estimated Creatinine Clearance: 43.7 mL/min (A) (by C-G formula based on SCr of 2.2 mg/dL (H)).   Medical History: Past Medical History:  Diagnosis Date  . Back pain   . CKD (chronic kidney disease), stage III ([place])   . CVA (cerebral infarction)   . Diastolic dysfunction   . ED (erectile dysfunction)   . GERD (gastroesophageal reflux disease)   . Hypercalcemia   . Hyperlipidemia   . Hypertension   . Insomnia   . Long-term use of high-risk medication   . Nephrocalcinosis   . Nephrolithiasis   . Obesity   . Osteopenia   . Pancreatitis   . PE (pulmonary embolism)   . Proteinuria   . Sarcoidosis   . Smoker     Assessment: 65 y/o M who presented to the ED with altered mental status. Recent shoulder surgery. Pt has hx of PE but no longer on anti-coagulation. Mild troponin elevated. Starting heparin for rule out PE until VQ scan can be completed. CBC good, noted renal dysfunction.   Goal of Therapy:  Heparin level 0.3-0.7 units/ml Monitor platelets by anticoagulation protocol: Yes   Plan:  Heparin 6000 units BOLUS Start heparin drip at 1400 units/hr 0900  Heparin level Daily CBC/Heparin level Monitor for bleeding F/U VQ scan results   Narda Bonds, PharmD, BCPS Clinical Pharmacist Phone: [phone]

## 2020-11-19 NOTE — ED Notes (Signed)
Patient denies pain and is resting comfortably.  

## 2020-11-20 DIAGNOSIS — D8689 Sarcoidosis of other sites: Secondary | ICD-10-CM | POA: Diagnosis present

## 2020-11-20 DIAGNOSIS — I10 Essential (primary) hypertension: Secondary | ICD-10-CM | POA: Diagnosis not present

## 2020-11-20 DIAGNOSIS — R609 Edema, unspecified: Secondary | ICD-10-CM | POA: Diagnosis not present

## 2020-11-20 DIAGNOSIS — Z20822 Contact with and (suspected) exposure to covid-19: Secondary | ICD-10-CM | POA: Diagnosis present

## 2020-11-20 DIAGNOSIS — G4736 Sleep related hypoventilation in conditions classified elsewhere: Secondary | ICD-10-CM | POA: Diagnosis present

## 2020-11-20 DIAGNOSIS — M8580 Other specified disorders of bone density and structure, unspecified site: Secondary | ICD-10-CM | POA: Diagnosis present

## 2020-11-20 DIAGNOSIS — B9689 Other specified bacterial agents as the cause of diseases classified elsewhere: Secondary | ICD-10-CM | POA: Diagnosis present

## 2020-11-20 DIAGNOSIS — G9340 Encephalopathy, unspecified: Secondary | ICD-10-CM | POA: Diagnosis not present

## 2020-11-20 DIAGNOSIS — G629 Polyneuropathy, unspecified: Secondary | ICD-10-CM

## 2020-11-20 DIAGNOSIS — J9811 Atelectasis: Secondary | ICD-10-CM | POA: Diagnosis present

## 2020-11-20 DIAGNOSIS — G4733 Obstructive sleep apnea (adult) (pediatric): Secondary | ICD-10-CM | POA: Diagnosis present

## 2020-11-20 DIAGNOSIS — R4701 Aphasia: Secondary | ICD-10-CM | POA: Diagnosis present

## 2020-11-20 DIAGNOSIS — J9601 Acute respiratory failure with hypoxia: Secondary | ICD-10-CM | POA: Diagnosis present

## 2020-11-20 DIAGNOSIS — R7401 Elevation of levels of liver transaminase levels: Secondary | ICD-10-CM | POA: Diagnosis present

## 2020-11-20 DIAGNOSIS — M7989 Other specified soft tissue disorders: Secondary | ICD-10-CM | POA: Diagnosis present

## 2020-11-20 DIAGNOSIS — R29810 Facial weakness: Secondary | ICD-10-CM | POA: Diagnosis present

## 2020-11-20 DIAGNOSIS — J9602 Acute respiratory failure with hypercapnia: Secondary | ICD-10-CM | POA: Diagnosis present

## 2020-11-20 DIAGNOSIS — G6289 Other specified polyneuropathies: Secondary | ICD-10-CM | POA: Diagnosis present

## 2020-11-20 DIAGNOSIS — N1831 Chronic kidney disease, stage 3a: Secondary | ICD-10-CM | POA: Diagnosis present

## 2020-11-20 DIAGNOSIS — G9341 Metabolic encephalopathy: Secondary | ICD-10-CM | POA: Diagnosis present

## 2020-11-20 DIAGNOSIS — I130 Hypertensive heart and chronic kidney disease with heart failure and stage 1 through stage 4 chronic kidney disease, or unspecified chronic kidney disease: Secondary | ICD-10-CM | POA: Diagnosis present

## 2020-11-20 DIAGNOSIS — H5789 Other specified disorders of eye and adnexa: Secondary | ICD-10-CM | POA: Diagnosis present

## 2020-11-20 DIAGNOSIS — F40240 Claustrophobia: Secondary | ICD-10-CM | POA: Diagnosis present

## 2020-11-20 DIAGNOSIS — R7881 Bacteremia: Secondary | ICD-10-CM | POA: Diagnosis present

## 2020-11-20 DIAGNOSIS — I509 Heart failure, unspecified: Secondary | ICD-10-CM | POA: Diagnosis present

## 2020-11-20 DIAGNOSIS — I248 Other forms of acute ischemic heart disease: Secondary | ICD-10-CM | POA: Diagnosis present

## 2020-11-20 DIAGNOSIS — I5031 Acute diastolic (congestive) heart failure: Secondary | ICD-10-CM | POA: Diagnosis present

## 2020-11-20 DIAGNOSIS — D869 Sarcoidosis, unspecified: Secondary | ICD-10-CM | POA: Diagnosis not present

## 2020-11-20 DIAGNOSIS — N179 Acute kidney failure, unspecified: Secondary | ICD-10-CM | POA: Diagnosis present

## 2020-11-20 LAB — CBC WITH DIFFERENTIAL/PLATELET
Abs Immature Granulocytes: 0.02 10*3/uL (ref 0.00–0.07)
Basophils Absolute: 0 10*3/uL (ref 0.0–0.1)
Basophils Relative: 1 %
Eosinophils Absolute: 0.3 10*3/uL (ref 0.0–0.5)
Eosinophils Relative: 5 %
HCT: 35.5 % — ABNORMAL LOW (ref 39.0–52.0)
Hemoglobin: 11.9 g/dL — ABNORMAL LOW (ref 13.0–17.0)
Immature Granulocytes: 0 %
Lymphocytes Relative: 12 %
Lymphs Abs: 0.8 10*3/uL (ref 0.7–4.0)
MCH: 30.4 pg (ref 26.0–34.0)
MCHC: 33.5 g/dL (ref 30.0–36.0)
MCV: 90.8 fL (ref 80.0–100.0)
Monocytes Absolute: 0.5 10*3/uL (ref 0.1–1.0)
Monocytes Relative: 8 %
Neutro Abs: 4.8 10*3/uL (ref 1.7–7.7)
Neutrophils Relative %: 74 %
Platelets: 200 10*3/uL (ref 150–400)
RBC: 3.91 MIL/uL — ABNORMAL LOW (ref 4.22–5.81)
RDW: 14.1 % (ref 11.5–15.5)
WBC: 6.4 10*3/uL (ref 4.0–10.5)
nRBC: 0 % (ref 0.0–0.2)

## 2020-11-20 LAB — COMPREHENSIVE METABOLIC PANEL
ALT: 54 U/L — ABNORMAL HIGH (ref 0–44)
AST: 31 U/L (ref 15–41)
Albumin: 3 g/dL — ABNORMAL LOW (ref 3.5–5.0)
Alkaline Phosphatase: 83 U/L (ref 38–126)
Anion gap: 8 (ref 5–15)
BUN: 14 mg/dL (ref 8–23)
CO2: 31 mmol/L (ref 22–32)
Calcium: 8.6 mg/dL — ABNORMAL LOW (ref 8.9–10.3)
Chloride: 103 mmol/L (ref 98–111)
Creatinine, Ser: 2.02 mg/dL — ABNORMAL HIGH (ref 0.61–1.24)
GFR, Estimated: 36 mL/min — ABNORMAL LOW (ref >60)
Glucose, Bld: 110 mg/dL — ABNORMAL HIGH (ref 70–99)
Potassium: 4.2 mmol/L (ref 3.5–5.1)
Sodium: 142 mmol/L (ref 135–145)
Total Bilirubin: 1.1 mg/dL (ref 0.3–1.2)
Total Protein: 5.6 g/dL — ABNORMAL LOW (ref 6.5–8.1)

## 2020-11-20 LAB — URINE CULTURE

## 2020-11-20 LAB — ANGIOTENSIN CONVERTING ENZYME, CSF: Angio Convert Enzyme: <1.5 U/L (ref 0.0–3.1)

## 2020-11-20 LAB — ANGIOTENSIN CONVERTING ENZYME: Angiotensin-Converting Enzyme: 28 U/L (ref 14–82)

## 2020-11-20 LAB — VDRL, CSF: VDRL Quant, CSF: NONREACTIVE

## 2020-11-20 LAB — HEPATITIS B SURFACE ANTIBODY, QUANTITATIVE: Hep B S AB Quant (Post): <3.1 m[IU]/mL — ABNORMAL LOW (ref >9.9)

## 2020-11-20 MED ORDER — LORAZEPAM 2 MG/ML IJ SOLN: 0.5000 mg | Freq: Once | INTRAMUSCULAR | Status: DC

## 2020-11-20 MED ORDER — PHENYLEPHRINE HCL 2.5 % OP SOLN
1.0000 [drp] | Freq: Once | OPHTHALMIC | Status: DC
  Filled 2020-11-20: qty 2

## 2020-11-20 MED ORDER — TROPICAMIDE 1 % OP SOLN
1.0000 [drp] | Freq: Once | OPHTHALMIC | Status: DC
  Filled 2020-11-20 (×3): qty 15

## 2020-11-20 NOTE — TOC Initial Note (Signed)
Transition of Care [facility]) - Initial/Assessment Note    Patient Details  Name: [name] MRN: [id number] Date of Birth: [birth year]  [facility]) CM/SW Contact:    Joanne Chars, LCSW Phone Number: 11/20/2020, 3:26 PM  Clinical Narrative:  CSW met with pt regarding discharge recommendation for outpt PT.  Pt had shoulder surgery recently and has been doing PT already at [facility], would like to continue.  Permission given to speak with wife and daughter.  Pt is vaccinated for covid and boosted.  No equipment needs, pt reports he had back surgery previously and "has everything" as far as equipment at home.                  Expected Discharge Plan: Home/Self Care Barriers to Discharge: Continued Medical Work up   Patient Goals and CMS Choice Patient states their goals for this hospitalization and ongoing recovery are:: "enjoy my senior years" CMS Medicare.gov Compare Post Acute Care list provided to::  (na) Choice offered to / list presented to : NA  Expected Discharge Plan and Services Expected Discharge Plan: Home/Self Care     Post Acute Care Choice: Resumption of Svcs/PTA Provider (outpt PT at [facility]) Living arrangements for the past 2 months: Single Family Home                 DME Arranged: N/A DME Agency: NA       HH Arranged: NA [place] Agency: NA        Prior Living Arrangements/Services Living arrangements for the past 2 months: [place] with:: Spouse,Adult Children (daughter) Patient language and need for interpreter reviewed:: Yes Do you feel safe going back to the place where you live?: Yes      Need for Family Participation in Patient Care: Yes (Comment) Care giver support system in place?: Yes (comment) Current home services: Other (comment) (none) Criminal Activity/Legal Involvement Pertinent to Current Situation/Hospitalization: No - Comment as needed  Activities of Daily Living Home Assistive  Devices/Equipment: Dentures (specify type),Eyeglasses (top and bottom) ADL Screening (condition at time of admission) Patient's cognitive ability adequate to safely complete daily activities?: No (wfie present) Is the patient deaf or have difficulty hearing?: No Does the patient have difficulty seeing, even when wearing glasses/contacts?: No Does the patient have difficulty concentrating, remembering, or making decisions?: Yes Patient able to express need for assistance with ADLs?: Yes Does the patient have difficulty dressing or bathing?: No Independently performs ADLs?: Yes (appropriate for developmental age) Does the patient have difficulty walking or climbing stairs?: No Weakness of Legs: None Weakness of Arms/Hands: Right  Permission Sought/Granted Permission sought to share information with : Family Supports Permission granted to share information with : Yes, Verbal Permission Granted  Share Information with NAME: wife [name]           Emotional Assessment Appearance:: Appears stated age Attitude/Demeanor/Rapport: Engaged Affect (typically observed): Appropriate,Pleasant Orientation: : Oriented to Self,Oriented to Place,Oriented to  Time,Oriented to Situation Alcohol / Substance Use: Not Applicable Psych Involvement: No (comment)  Admission diagnosis:  Aphasia [R47.01] Hypoxia [R09.02] Acute encephalopathy [G93.40] Altered mental status, unspecified altered mental status type [R41.82] Gram-positive bacteremia [R78.81] Patient Active Problem List   Diagnosis Date Noted  . ARF (acute renal failure) ([place]) 11/19/2020  . Acute respiratory failure ([place]) 11/19/2020  . Gram-positive bacteremia 11/19/2020  . Cranial neuropathy   . Acute encephalopathy 11/18/2020  . Chronic right shoulder pain 06/18/2020  . OSA (obstructive  sleep apnea) 05/05/2020  . Hypersomnia with sleep apnea 12/20/2019  . Psychophysiological insomnia 09/18/2019  . Nocturia more than twice per night  09/18/2019  . Renal hypertension 09/18/2019  . Moderate asthma 09/18/2019  . Tobacco abuse 09/18/2019  . Non-restorative sleep 09/18/2019  . Nontraumatic complete tear of right rotator cuff 08/15/2019  . Delirium 03/16/2019  . Stage 3b chronic kidney disease   . CVA (cerebral infarction)   . Diastolic dysfunction   . Pseudarthrosis 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 02/28/2019  . Intervertebral disc disorder with radiculopathy of lumbar region 09/13/2017  . Acute gouty arthritis 03/07/2017  . Arthralgia of left foot 03/07/2017  . Nephrolithiasis   . Fever, unspecified 11/02/2015  . Essential hypertension 02/07/2015  . Pulmonary embolism ([place]) 01/30/2013  . Sarcoidosis 01/30/2013  . Hemoptysis 01/30/2013  . SHOULDER PAIN, LEFT 11/28/2009   PCP:  [name], MD Pharmacy:   [id number] [place] [address]Phone: [phone]: [phone]    Social Determinants of Health (SDOH) Interventions    Readmission Risk Interventions No flowsheet data found.

## 2020-11-20 NOTE — Progress Notes (Addendum)
Pt had Ativan earlier prior to MRI, now pt is restless, attempting to leave, refusing tele monitor.  MD notified and new orders placed. Pt pulled off tele monitor, SpO2 sensor, one of his IVs. He refused all care, does not allow RN to attach medical devices back. He stated " I do not trust anyone here" Spoke with pt wife and she will come to stay with pt.

## 2020-11-20 NOTE — Evaluation (Signed)
Physical Therapy Evaluation and Discharge Patient Details Name: [name] MRN: [id number] DOB: Nov 10, 1955 Today's Date: 11/20/2020   History of Present Illness  65 y.o. 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  Patient had a recent right rotator cuff ~early March. CT head negative; MRI brain negative; LP attempted but not successful. Acute encephalopathy, acute respiratory failure, acute on chronic renal failure  Clinical Impression   Pt admitted with above diagnosis. Patient normally independent with mobility and demonstrated same here. (Required assist only for lines, monitor, oxygen). Patient at his baseline for mobility. Have asked OT to address rotator cuff while pt hospitalized. No further PT needs identified. PT is signing off.     Follow Up Recommendations Outpatient PT (for post-rotator cuff repair)    Equipment Recommendations  None recommended by PT    Recommendations for Other Services       Precautions / Restrictions Precautions Precautions: Fall Restrictions Weight Bearing Restrictions: No      Mobility  Bed Mobility               General bed mobility comments: sitting EOB on arrival    Transfers Overall transfer level: Needs assistance Equipment used: None Transfers: Sit to/from Stand Sit to Stand: Independent         General transfer comment: supervision for lines/monitors, but no physical assist or guarding required (pt mindful of lines/cords)  Ambulation/Gait Ambulation/Gait assistance: Supervision;Independent Gait Distance (Feet): 200 Feet Assistive device: None Gait Pattern/deviations: Shuffle Gait velocity: decr appropriately for shoes and lines/pole   General Gait Details: slippers too big and contributing to shuffling gait (wife reports this is more than usual, but  slippers are big). Supervision for lines, O2 but no physical assist or guarding needed  Stairs            Wheelchair Mobility    Modified Rankin (Stroke Patients Only)       Balance Overall balance assessment: No apparent balance deficits (not formally assessed)                                           Pertinent Vitals/Pain Pain Assessment: No/denies pain    Home Living Family/patient expects to be discharged to:: Private residence Living Arrangements: Spouse/significant other Available Help at Discharge: Family;Available 24 hours/day Type of Home: House Home Access: Level entry     Home Layout: Two level;Bed/bath upstairs Home Equipment: None      Prior Function Level of Independence: Independent         Comments: 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     Hand Dominance   Dominant Hand: Right    Extremity/Trunk Assessment   Upper Extremity Assessment Upper Extremity Assessment: Defer to OT evaluation    Lower Extremity Assessment Lower Extremity Assessment: Overall WFL for tasks assessed    Cervical / Trunk Assessment Cervical / Trunk Assessment: Normal  Communication   Communication: No difficulties  Cognition Arousal/Alertness: Suspect due to medications (drowsy) Behavior During Therapy: Flat affect Overall Cognitive Status: Impaired/Different from baseline Area of Impairment: Problem solving                             Problem Solving: Slow processing General  Comments: Wife present and attributes current drowsiness to Ativan given overnight      General Comments General comments (skin integrity, edema, etc.): Wife present throughout session. Patient had been to one OPPT session for his rotator cuff surgery post-op.    Exercises     Assessment/Plan    PT Assessment All further PT needs can be met in the next venue of care  PT Problem List Other (comment) (post-op rotator cuff surgery)       PT  Treatment Interventions      PT Goals (Current goals can be found in the Care Plan section)  Acute Rehab PT Goals Patient Stated Goal: to go home PT Goal Formulation: All assessment and education complete, DC therapy    Frequency     Barriers to discharge        Co-evaluation               AM-PAC PT "6 Clicks" Mobility  Outcome Measure Help needed turning from your back to your side while in a flat bed without using bedrails?: None Help needed moving from lying on your back to sitting on the side of a flat bed without using bedrails?: None Help needed moving to and from a bed to a chair (including a wheelchair)?: None Help needed standing up from a chair using your arms (e.g., wheelchair or bedside chair)?: None Help needed to walk in hospital room?: None Help needed climbing 3-5 steps with a railing? : None 6 Click Score: 24    End of Session Equipment Utilized During Treatment: Gait belt;Oxygen Activity Tolerance: Patient tolerated treatment well Patient left: in chair;with family/visitor present;Other (comment) (with OT) Nurse Communication: Mobility status;Other (comment) (decr O2 to 2L with sats >=94%) PT Visit Diagnosis: Difficulty in walking, not elsewhere classified (R26.2)    Time: 1415-9733 PT Time Calculation (min) (ACUTE ONLY): 25 min   Charges:   PT Evaluation $PT Eval Low Complexity: 1 Low PT Treatments $Gait Training: 8-22 mins         Arby Barrette, PT Pager [phone]   Rexanne Mano 11/20/2020, 11:11 AM

## 2020-11-20 NOTE — Progress Notes (Signed)
Neurology F/u Note  Subjective: Patient lucid and comfortable this evening, reports no neurologic concerns. Improved movement L forehead.  ID notes reviewed  Interval data:  MRV negative  Ophtho consulted; per Dr. Prudencio Burly ophtho exam was normal w/o evidence of papilledema and normal retinal veins/vasculature   Exam:  Vitals:   11/20/20 1438 11/20/20 1800  BP: (!) 169/91 (!) 147/95  Pulse: 64 61  Resp: (!) 25 (!) 22  Temp:  98.6 F (37 C)  SpO2: 93% 93%   Gen: In bed, NAD Resp: non-labored breathing, no acute distress Abd: soft, nt  Neuro: MS: A&Ox4 CN: PERRL, EOMI, sensation intact, impaired forehead elevation L is improving, hearing intact to voice, tongue midline Motor: 5/5 strength throughout Sensory:SILT DTR: 2+ symmetric throughout, toes down  Pertinent Labs:  ESR 19 TSH 1.247 B12 374 HIV NR  LP 3/16 RBC 1 WBC 1 Protein 27 Gluc 9 Gram stain: WBC predom mononuclear, no org Cx pending Crypto antigen neg HSV pending  CTA showed enhancement L supraorbital vein   MRI brain wo contrast 3/16  EXAM: MRI HEAD WITHOUT CONTRAST  TECHNIQUE: Multiplanar, multiecho pulse sequences of the brain and surrounding structures were obtained without intravenous contrast.  COMPARISON:  Head CT and CTA from yesterday  FINDINGS: Brain: No acute infarction, hemorrhage, hydrocephalus, extra-axial collection or mass lesion.  Moderate for age FLAIR hyperintensity in the deep white matter attributed to chronic small vessel disease given medical history. Brain volume is normal. Choroid fissure cyst on the right, incidental. Remote micro hemorrhages in the right thalamus, usually hypertensive  Vascular: Normal flow voids.  Skull and upper cervical spine: No focal marrow lesion  Sinuses/Orbits: The left superior ophthalmic vein was highlighted on prior. Intermittently these vessels appear distended which is usually from Valsalva. No asymmetry of these veins  or the cavernous sinus region.  IMPRESSION: 1. No emergent finding. 2. Moderate chronic small vessel ischemia.  CNS imaging personally reviewed. Agree with above findings.  Impression: 59M hx of sarcoidosis on steroids p/w lethargy x 2-3 days + several hours of L upper facial droop, fever and aphasia out of proportion to AMS. Encephalopathy now vastly improved without intervention. Occasional fluctuations are favored to be related to hypoxia/hypercarbia/matabolic derangements. Neurologic exam remains significant for impaired L forehead movement although this is improving.  Elevated opening pressure of 46 documented on IR LP favored to be spurious - no e/o meningitis on LP, no papilledema on ophtho exam. MRV neg for CSVT.  Recommendations: 1) Agree with discontinuation of broad spectrum coverage for CNS per ID. Patient continues on vanc per ID recs pending further blood cx results and susceptibilities. Agree w/ discontinuation of acyclovir given v low likelihood of HSV and renal insufficiency.  Neurology will not continue to actively follow, but please re-engage if new neurologic concerns arise. I will place an ambulatory referral to neurology for post-discharge o/p f/u.  Su Monks, MD [facility] [phone]  If 7pm- 7am, please page neurology on call as listed in [place].

## 2020-11-20 NOTE — Progress Notes (Signed)
Pt resting in bed, wife at the bedside, at this time RN able to place monitor and continuous pulse ox back on pt.  Pt cooperative at this time.

## 2020-11-20 NOTE — Evaluation (Signed)
Occupational Therapy Evaluation Patient Details Name: [name] MRN: [id number] DOB: 04/21/1956 Today's Date: 11/20/2020    History of Present Illness 65 y.o. 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  Patient had a recent right rotator cuff ~early March. CT head negative; MRI brain negative; LP attempted but not successful. Acute encephalopathy, acute respiratory failure, acute on chronic renal failure   Clinical Impression   Pt admitted with the above diagnoses and presents with below problem list. Pt will benefit from continued acute OT to address the below listed deficits and maximize independence with basic ADLs prior to d/c home. At baseline, pt is independent with ADLs. Pt currently needs setup to min guard assist with basic ADLs for safety. Pt does not appear to be at baseline with cognition. Spouse present throughout session.      Follow Up Recommendations  Other (comment) (continue with plan for OP therapy for R shoulder)    Equipment Recommendations  None recommended by OT    Recommendations for Other Services       Precautions / Restrictions Precautions Precautions: Fall Restrictions Weight Bearing Restrictions: No Other Position/Activity Restrictions: s/p R rotator cuff surgery on 2/24. Sling for comfort. Was about to start therapy to work on ROM.      Mobility Bed Mobility               General bed mobility comments: NT. Received OOB with PT, up to recliner at end of OT session.    Transfers Overall transfer level: Needs assistance Equipment used: None Transfers: Sit to/from Stand Sit to Stand: Supervision         General transfer comment: supervision for line management and 2/2 impaired cognition. No physical assist needed.    Balance Overall balance assessment: Mild deficits  observed, not formally tested                                         ADL either performed or assessed with clinical judgement   ADL Overall ADL's : Needs assistance/impaired Eating/Feeding: Set up;Sitting   Grooming: Min guard;Oral care;Set up;Standing Grooming Details (indicate cue type and reason): min guard for safety 2/2 lines and crowded space Upper Body Bathing: Set up;Sitting   Lower Body Bathing: Min guard;Sit to/from stand   Upper Body Dressing : Set up;Sitting   Lower Body Dressing: Min guard;Sit to/from stand   Toilet Transfer: Supervision/safety;Min guard;Cueing for safety;Ambulation   Toileting- Clothing Manipulation and Hygiene: Set up;Min guard;Sitting/lateral lean;Sit to/from stand   Tub/ Shower Transfer: Walk-in shower;Supervision/safety;Min guard;Cueing for safety;Ambulation   Functional mobility during ADLs: Supervision/safety General ADL Comments: Pt received after walking in the halls with PT. Pt completed oral care standing at sink then walked a lap in the room to assess O2 on 2L (had been on 4L with sats >92 walking household distances; RA at baseline) No physical assist needed but at supervision to min guard with OOB activity 2/2 imparied cognition and decreased safety awareness.     Vision         Perception     Praxis      Pertinent Vitals/Pain Pain Assessment: No/denies pain     Hand Dominance Right   Extremity/Trunk Assessment Upper Extremity Assessment Upper Extremity Assessment: Overall WFL for tasks  assessed;RUE deficits/detail RUE Deficits / Details: s/p R rotator cuff surgery 2/24. need to further assess ROM. Pt declined ROM testing to R shoulder on OT eval.   Lower Extremity Assessment Lower Extremity Assessment: Defer to PT evaluation   Cervical / Trunk Assessment Cervical / Trunk Assessment: Normal   Communication Communication Communication: No difficulties   Cognition Arousal/Alertness: Suspect due to  medications (drowsy) Behavior During Therapy: Flat affect Overall Cognitive Status: Impaired/Different from baseline Area of Impairment: Problem solving;Memory;Safety/judgement                     Memory: Decreased short-term memory   Safety/Judgement: Decreased awareness of deficits   Problem Solving: Slow processing;Decreased initiation General Comments: Wife present and attributes current drowsiness to Ativan given overnight   General Comments  Wife present throughout session. Patient had been to one OPPT session for his rotator cuff surgery post-op.    Exercises     Shoulder Instructions      Home Living Family/patient expects to be discharged to:: Private residence Living Arrangements: Spouse/significant other Available Help at Discharge: Family;Available 24 hours/day Type of Home: House Home Access: Level entry     Home Layout: Two level;Bed/bath upstairs Alternate Level Stairs-Number of Steps: flight   Bathroom Shower/Tub: Occupational psychologist: Handicapped height     Home Equipment: None   Additional Comments: spouse present and assisting with home setup and PLOF data.      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        OT Problem List: Decreased activity tolerance;Impaired balance (sitting and/or standing);Decreased cognition;Decreased safety awareness;Decreased knowledge of use of DME or AE;Decreased knowledge of precautions;Pain;Impaired UE functional use      OT Treatment/Interventions: Self-care/ADL training;Therapeutic exercise;DME and/or AE instruction;Therapeutic activities;Cognitive remediation/compensation;Patient/family education;Balance training    OT Goals(Current goals can be found in the care plan section) Acute Rehab OT Goals Patient Stated Goal: to go home OT Goal Formulation: With patient/family Time For Goal Achievement:  12/04/20 Potential to Achieve Goals: Good ADL Goals Pt Will Perform Grooming: Independently;standing Pt Will Perform Upper Body Dressing: with modified independence;sitting Pt Will Perform Lower Body Dressing: with modified independence;sit to/from stand Pt Will Perform Tub/Shower Transfer: Shower transfer;ambulating  OT Frequency: Min 2X/week   Barriers to D/C:            Co-evaluation              AM-PAC OT "6 Clicks" Daily Activity     Outcome Measure Help from another person eating meals?: None Help from another person taking care of personal grooming?: None Help from another person toileting, which includes using toliet, bedpan, or urinal?: A Little Help from another person bathing (including washing, rinsing, drying)?: A Little Help from another person to put on and taking off regular upper body clothing?: None Help from another person to put on and taking off regular lower body clothing?: A Little 6 Click Score: 21   End of Session Equipment Utilized During Treatment: Gait belt;Oxygen Nurse Communication: Other (comment) (O2 sats, able to come down from 4L to 2L with sats >94. left on 2L.)  Activity Tolerance: Patient tolerated treatment well Patient left: in chair;with call bell/phone within reach;with chair alarm set;with family/visitor present  OT Visit Diagnosis: Other symptoms and signs involving cognitive function;Pain;Unsteadiness on feet (R26.81)  Time: 1660-6301 OT Time Calculation (min): 10 min Charges:  OT General Charges $OT Visit: 1 Visit OT Evaluation $OT Eval Low Complexity: [place], OT Acute Rehabilitation Services Pager: [phone] Office: [phone]   Hortencia Pilar 11/20/2020, 11:35 AM

## 2020-11-20 NOTE — Progress Notes (Addendum)
PROGRESS NOTE  [name]  UDJ:[id number] DOB: 1956-04-03 DOA: 11/18/2020 PCP: [name], MD   Brief Narrative: [name] is a 65 y.o. male with a history of sarcoidosis on chronic prednisone 92m, stage IIIa CKD, OSA not on CPAP, HTN, and rotator cuff tear s/p repair 2 weeks prior who presented to the ED 3/15 with confusion. In the ED he was mildly hypoxic with CXR demonstrating vascular congestion. Pt was febrile to 101.35F with WBC of 8k. SCr was 2.3 (from baseline ~1.4). Neurology was consulted due to encephalopathy, recommending empiric Tx for meningitis/encephalitis pending fluoro-guided LP and MRI brain which was unremarkable. Blood cultures have grown GPC in 1 of 4. V/Q scan was negative and empiric heparin discontinued. LP not consistent with meningitis.   Assessment & Plan: Principal Problem:   Gram-positive bacteremia Active Problems:   Sarcoidosis   Essential hypertension   Tobacco abuse   OSA (obstructive sleep apnea)   Acute encephalopathy   ARF (acute renal failure) (HCC)   Acute respiratory failure (HCC)   Cranial neuropathy  Acute metabolic encephalopathy: LP not consistent with meningitis. Improvement prior to antiviral not consistent with encephalitis. Possibly metabolic with renal impairment. Folate, B12 wnl. ESR 19. TSH 1.247. Hypercarbia and hypoxia likely contributing as well. MRV negative.  - Delirium precautions. Urged wife to be at bedside if possible. She is unable to be here tomorrow 8a-4p. Would avoid ativan if possible.  - Will DC empiric antibiotic coverage and acyclovir.  - D/w neurology. Ophthalmology consulted for eval papilledema in setting of high opening pressures. This may have been spurious.  Fever, bacteremia: Blood culture in 2 of 4 growing GPC w/rapid results indicating both S. epi and S. aureus with +methicillin resistance.  - ID consult appreciated, on vancomycin monotherapy pending further culture data. Repeat cultures NGTD.    Acute hypoxic respiratory failure: V/Q scan negative.  - Echocardiogram showed LVEF 60-65%, G1DD, tricuspid AV without vegetations. IVC dilated. BNP elevated, troponin elevated most likely due to demand ischemia as trend is flat and pt denies chest pain and no STEMI on ECG. Pt not taking hardly any fluids. Will check LE U/S to eval for DVT with swelling. Will repeat CXR in AM, low threshold for diuresis.  - IS as atelectasis is very likely.  - Continue supplemental oxygen as needed to maintain SpO2 90-95%. Consider NIPPV if worsening as pt has OSA not on CPAP.   AKI on stage IIIa CKD: Suspected to be due to dehydration initially. Slightly improved.   Sarcoidosis:  - Continue chronic steroids. Given a dose of stress steroids.  HTN:  - Diltiazem, hydralazine, labetalol  HLD:  - Holding statin with LFT elevation.   Mild LFT elevations:  - Unclear etiology, improving.  Hematuria:  - Repeat at follow up.  Obesity: Estimated body mass index is 32.29 kg/m as calculated from the following:   Height as of this encounter: 6' (1.829 m).   Weight as of this encounter: 108 kg.  DVT prophylaxis: Lovenox Code Status: Full Family Communication: Wife Disposition Plan:  Status is: Inpatient  Remains inpatient appropriate because:Altered mental status and Ongoing diagnostic testing needed not appropriate for outpatient work up   Dispo: The patient is from: Home              Anticipated d/c is to: Home              Patient currently is not medically stable to d/c.   Difficult to place patient No  Consultants:   Neurology  Ophthalmology  Radiology  Infectious disease  Procedures:   LP  Antimicrobials:  Vancomycin, cefepime, ampicillin, acyclovir   Subjective: Drowsy after ativan overnight. No chest pain or dyspnea currently. Leg swelling slightly worse.   Objective: Vitals:   11/20/20 0658 11/20/20 0815 11/20/20 1230 11/20/20 1438  BP:  (!) 162/88 (!) 166/87 (!)  169/91  Pulse: 68 (!) 55 63 64  Resp: 18 (!) 22 (!) 23 (!) 25  Temp:  98.4 F (36.9 C) 98.3 F (36.8 C)   TempSrc:  Oral Oral   SpO2: 100% 96% 96% 93%  Weight:      Height:        Intake/Output Summary (Last 24 hours) at 11/20/2020 1457 Last data filed at 11/20/2020 0300 Gross per 24 hour  Intake 1600.11 ml  Output 840 ml  Net 760.11 ml   Filed Weights   11/18/20 1852  Weight: 108 kg   Gen: 64 y.o. male in no distress, drowsy but oriented Pulm: Non-labored breathing supplemental oxygen. Distant crackles. CV: Regular rate and rhythm. No murmur, rub, or gallop. UTD JVD, + pedal edema. GI: Abdomen soft, non-tender, non-distended, with normoactive bowel sounds. No organomegaly or masses felt. Ext: Warm, no deformities Skin: No rashes, lesions or ulcers Neuro: Rousable and intermittently cooperative, oriented. No focal neurological deficits. Psych: Judgement and insight appear fair. Mood & affect appropriate.   Data Reviewed: I have personally reviewed following labs and imaging studies  CBC: Recent Labs  Lab 11/18/20 1858 11/18/20 1921 11/18/20 1922 11/19/20 0233 11/20/20 0706  WBC 8.0  --   --  7.0 6.4  NEUTROABS 5.8  --   --  5.1 4.8  HGB 13.0 13.6 13.6 12.4* 11.9*  HCT 39.4 40.0 40.0 39.4 35.5*  MCV 93.8  --   --  96.3 90.8  PLT 244  --   --  208 194   Basic Metabolic Panel: Recent Labs  Lab 11/18/20 1858 11/18/20 1921 11/18/20 1922 11/19/20 0233 11/20/20 0706  NA 137 138 138 135 142  K 4.2 4.2 4.2 4.2 4.2  CL 98  --  97* 99 103  CO2 31  --   --  30 31  GLUCOSE 84  --  82 111* 110*  BUN 14  --  _0 CREATININE 2.31*  --  2.20* 2.37* 2.02*  CALCIUM 8.8*  --   --  8.4* 8.6*   GFR: Estimated Creatinine Clearance: 47.5 mL/min (A) (by C-G formula based on SCr of 2.02 mg/dL (H)). Liver Function Tests: Recent Labs  Lab 11/18/20 1858 11/19/20 0233 11/20/20 0706  AST 63* 64* 31  ALT 74* 73* 54*  ALKPHOS 105 103 83  BILITOT 1.1 1.4* 1.1  PROT 6.3*  5.7* 5.6*  ALBUMIN 3.4* 3.2* 3.0*   No results for input(s): LIPASE, AMYLASE in the last 168 hours. Recent Labs  Lab 11/18/20 2350  AMMONIA 22   Coagulation Profile: Recent Labs  Lab 11/18/20 1858  INR 1.2   Cardiac Enzymes: No results for input(s): CKTOTAL, CKMB, CKMBINDEX, TROPONINI in the last 168 hours. BNP (last 3 results) No results for input(s): PROBNP in the last 8760 hours. HbA1C: No results for input(s): HGBA1C in the last 72 hours. CBG: No results for input(s): GLUCAP in the last 168 hours. Lipid Profile: No results for input(s): CHOL, HDL, LDLCALC, TRIG, CHOLHDL, LDLDIRECT in the last 72 hours. Thyroid Function Tests: Recent Labs    11/19/20 0233  TSH 1.247  Anemia Panel: Recent Labs    11/18/20 2350  VITAMINB12 374  FOLATE 11.4   Urine analysis:    Component Value Date/Time   COLORURINE YELLOW 11/18/2020 2345   APPEARANCEUR HAZY (A) 11/18/2020 2345   LABSPEC 1.032 (H) 11/18/2020 2345   PHURINE 5.0 11/18/2020 2345   GLUCOSEU NEGATIVE 11/18/2020 2345   HGBUR MODERATE (A) 11/18/2020 2345   BILIRUBINUR NEGATIVE 11/18/2020 2345   KETONESUR NEGATIVE 11/18/2020 2345   PROTEINUR >=300 (A) 11/18/2020 2345   UROBILINOGEN 0.2 11/16/2012 1004   NITRITE NEGATIVE 11/18/2020 2345   LEUKOCYTESUR NEGATIVE 11/18/2020 2345   Recent Results (from the past 240 hour(s))  Urine culture     Status: Abnormal   Collection Time: 11/18/20  6:58 PM   Specimen: In/Out Cath Urine  Result Value Ref Range Status   Specimen Description IN/OUT CATH URINE  Final   Special Requests   Final    NONE Performed at [facility], [place] [address] [place], [place] [address]    Culture MULTIPLE SPECIES PRESENT, SUGGEST RECOLLECTION (A)  Final   Report Status 11/20/2020 FINAL  Final  Culture, blood (routine x 2)     Status: Abnormal (Preliminary result)   Collection Time: 11/18/20  6:59 PM   Specimen: BLOOD  Result Value Ref Range Status   Specimen Description BLOOD LEFT  ANTECUBITAL  Final   Special Requests   Final    BOTTLES DRAWN AEROBIC AND ANAEROBIC Blood Culture adequate volume   Culture  Setup Time   Final    GRAM POSITIVE COCCI IN CLUSTERS IN BOTH AEROBIC AND ANAEROBIC BOTTLES Organism ID to follow CRITICAL RESULT CALLED TO, READ BACK BY AND VERIFIED WITH: T JACKY PHARMD 1418 11/19/20 A BROWNING    Culture (A)  Final    STAPHYLOCOCCUS AUREUS SUSCEPTIBILITIES TO FOLLOW STAPHYLOCOCCUS EPIDERMIDIS THE SIGNIFICANCE OF ISOLATING THIS ORGANISM FROM A SINGLE SET OF BLOOD CULTURES WHEN MULTIPLE SETS ARE DRAWN IS UNCERTAIN. PLEASE NOTIFY THE MICROBIOLOGY DEPARTMENT WITHIN ONE WEEK IF SPECIATION AND SENSITIVITIES ARE REQUIRED. Performed at [facility], [place] [address], [place], [place] [address]    Report Status PENDING  Incomplete  Blood Culture ID Panel (Reflexed)     Status: Abnormal   Collection Time: 11/18/20  6:59 PM  Result Value Ref Range Status   Enterococcus faecalis NOT DETECTED NOT DETECTED Final   Enterococcus Faecium NOT DETECTED NOT DETECTED Final   Listeria monocytogenes NOT DETECTED NOT DETECTED Final   Staphylococcus species DETECTED (A) NOT DETECTED Final    Comment: CRITICAL RESULT CALLED TO, READ BACK BY AND VERIFIED WITH: T JACKY PHARMD 1418 11/19/20 A BROWNING    Staphylococcus aureus (BCID) DETECTED (A) NOT DETECTED Final    Comment: CRITICAL RESULT CALLED TO, READ BACK BY AND VERIFIED WITH: T JACKY PHARMD 1418 11/19/20 A BROWNING    Staphylococcus epidermidis DETECTED (A) NOT DETECTED Final    Comment: CRITICAL RESULT CALLED TO, READ BACK BY AND VERIFIED WITH: Lockie Mola PHARMD 1418 11/19/20 A BROWNING    Staphylococcus lugdunensis NOT DETECTED NOT DETECTED Final   Streptococcus species NOT DETECTED NOT DETECTED Final   Streptococcus agalactiae NOT DETECTED NOT DETECTED Final   Streptococcus pneumoniae NOT DETECTED NOT DETECTED Final   Streptococcus pyogenes NOT DETECTED NOT DETECTED Final   A.calcoaceticus-baumannii NOT  DETECTED NOT DETECTED Final   Bacteroides fragilis NOT DETECTED NOT DETECTED Final   Enterobacterales NOT DETECTED NOT DETECTED Final   Enterobacter cloacae complex NOT DETECTED NOT DETECTED Final   Escherichia  coli NOT DETECTED NOT DETECTED Final   Klebsiella aerogenes NOT DETECTED NOT DETECTED Final   Klebsiella oxytoca NOT DETECTED NOT DETECTED Final   Klebsiella pneumoniae NOT DETECTED NOT DETECTED Final   Proteus species NOT DETECTED NOT DETECTED Final   Salmonella species NOT DETECTED NOT DETECTED Final   Serratia marcescens NOT DETECTED NOT DETECTED Final   Haemophilus influenzae NOT DETECTED NOT DETECTED Final   Neisseria meningitidis NOT DETECTED NOT DETECTED Final   Pseudomonas aeruginosa NOT DETECTED NOT DETECTED Final   Stenotrophomonas maltophilia NOT DETECTED NOT DETECTED Final   Candida albicans NOT DETECTED NOT DETECTED Final   Candida auris NOT DETECTED NOT DETECTED Final   Candida glabrata NOT DETECTED NOT DETECTED Final   Candida krusei NOT DETECTED NOT DETECTED Final   Candida parapsilosis NOT DETECTED NOT DETECTED Final   Candida tropicalis NOT DETECTED NOT DETECTED Final   Cryptococcus neoformans/gattii NOT DETECTED NOT DETECTED Final   Methicillin resistance mecA/C DETECTED (A) NOT DETECTED Final    Comment: CRITICAL RESULT CALLED TO, READ BACK BY AND VERIFIED WITH: T JACKY PHARMD 1418 11/19/20 A BROWNING    Meth resistant mecA/C and MREJ NOT DETECTED NOT DETECTED Final    Comment: Performed at [facility], [place] [address], [place], [place] [address]  Culture, blood (routine x 2)     Status: None (Preliminary result)   Collection Time: 11/18/20  7:11 PM   Specimen: BLOOD  Result Value Ref Range Status   Specimen Description BLOOD RIGHT ANTECUBITAL  Final   Special Requests   Final    BOTTLES DRAWN AEROBIC AND ANAEROBIC Blood Culture adequate volume   Culture   Final    NO GROWTH 2 DAYS Performed at [facility], [place] [address], [place],   [address]    Report Status PENDING  Incomplete  Resp Panel by RT-PCR (Flu A&B, Covid) Nasopharyngeal Swab     Status: None   Collection Time: 11/18/20  9:25 PM   Specimen: Nasopharyngeal Swab; Nasopharyngeal(NP) swabs in vial transport medium  Result Value Ref Range Status   SARS Coronavirus 2 by RT PCR NEGATIVE NEGATIVE Final    Comment: (NOTE) SARS-CoV-2 target nucleic acids are NOT DETECTED.  The SARS-CoV-2 RNA is generally detectable in upper respiratory specimens during the acute phase of infection. The lowest concentration of SARS-CoV-2 viral copies this assay can detect is 138 copies/mL. A negative result does not preclude SARS-Cov-2 infection and should not be used as the sole basis for treatment or other patient management decisions. A negative result may occur with  improper specimen collection/handling, submission of specimen other than nasopharyngeal swab, presence of viral mutation(s) within the areas targeted by this assay, and inadequate number of viral copies(<138 copies/mL). A negative result must be combined with clinical observations, patient history, and epidemiological information. The expected result is Negative.  Fact Sheet for Patients:  EntrepreneurPulse.com.au  Fact Sheet for Healthcare Providers:  IncredibleEmployment.be  This test is no t yet approved or cleared by the [place] FDA and  has been authorized for detection and/or diagnosis of SARS-CoV-2 by FDA under an Emergency Use Authorization (EUA). This EUA will remain  in effect (meaning this test can be used) for the duration of the COVID-19 declaration under Section 564(b)(1) of the Act, 21 U.S.C.section 360bbb-3(b)(1), unless the authorization is terminated  or revoked sooner.       Influenza A by PCR NEGATIVE NEGATIVE Final   Influenza B by PCR NEGATIVE NEGATIVE Final  Comment: (NOTE) The Xpert Xpress SARS-CoV-2/FLU/RSV plus assay is intended as an  aid in the diagnosis of influenza from Nasopharyngeal swab specimens and should not be used as a sole basis for treatment. Nasal washings and aspirates are unacceptable for Xpert Xpress SARS-CoV-2/FLU/RSV testing.  Fact Sheet for Patients: EntrepreneurPulse.com.au  Fact Sheet for Healthcare Providers: IncredibleEmployment.be  This test is not yet approved or cleared by the [place] FDA and has been authorized for detection and/or diagnosis of SARS-CoV-2 by FDA under an Emergency Use Authorization (EUA). This EUA will remain in effect (meaning this test can be used) for the duration of the COVID-19 declaration under Section 564(b)(1) of the Act, 21 U.S.C. section 360bbb-3(b)(1), unless the authorization is terminated or revoked.  Performed at [facility], [place] [address], [place], [place] [address]   CSF culture w Gram Stain     Status: None (Preliminary result)   Collection Time: 11/19/20  1:22 PM   Specimen: CSF  Result Value Ref Range Status   Specimen Description CSF  Final   Special Requests NONE  Final   Gram Stain   Final    WBC PRESENT, PREDOMINANTLY MONONUCLEAR NO ORGANISMS SEEN CYTOSPIN SMEAR    Culture   Final    NO GROWTH < 24 HOURS Performed at [facility], [place] [address], [place], [place] [address]    Report Status PENDING  Incomplete  Culture, blood (Routine X 2) w Reflex to ID Panel     Status: None (Preliminary result)   Collection Time: 11/19/20  6:18 PM   Specimen: BLOOD RIGHT ARM  Result Value Ref Range Status   Specimen Description BLOOD RIGHT ARM  Final   Special Requests   Final    BOTTLES DRAWN AEROBIC AND ANAEROBIC Blood Culture adequate volume   Culture   Final    NO GROWTH < 12 HOURS Performed at [facility], [place] [address] [place], [place] [address]    Report Status PENDING  Incomplete  Culture, blood (Routine X 2) w Reflex to ID Panel     Status: None (Preliminary result)    Collection Time: 11/19/20  6:28 PM   Specimen: BLOOD RIGHT HAND  Result Value Ref Range Status   Specimen Description BLOOD RIGHT HAND  Final   Special Requests   Final    BOTTLES DRAWN AEROBIC AND ANAEROBIC Blood Culture results may not be optimal due to an inadequate volume of blood received in culture bottles   Culture   Final    NO GROWTH < 12 HOURS Performed at [facility], [place] [address] [place], [place] [address]    Report Status PENDING  Incomplete      Radiology Studies: MR BRAIN WO CONTRAST  Result Date: 11/19/2020 CLINICAL DATA:  Altered mental status.  Fever EXAM: MRI HEAD WITHOUT CONTRAST TECHNIQUE: Multiplanar, multiecho pulse sequences of the brain and surrounding structures were obtained without intravenous contrast. COMPARISON:  Head CT and CTA from yesterday FINDINGS: Brain: No acute infarction, hemorrhage, hydrocephalus, extra-axial collection or mass lesion. Moderate for age FLAIR hyperintensity in the deep white matter attributed to chronic small vessel disease given medical history. Brain volume is normal. Choroid fissure cyst on the right, incidental. Remote micro hemorrhages in the right thalamus, usually hypertensive Vascular: Normal flow voids. Skull and upper cervical spine: No focal marrow lesion Sinuses/Orbits: The left superior ophthalmic vein was highlighted on prior. Intermittently these vessels appear distended which is usually from Valsalva. No asymmetry of  these veins or the cavernous sinus region. IMPRESSION: 1. No emergent finding. 2. Moderate chronic small vessel ischemia. Electronically Signed   By: Monte Fantasia M.D.   On: 11/19/2020 05:15   MR Venogram Head  Result Date: 11/20/2020 CLINICAL DATA:  Initial evaluation for possible dural venous sinus thrombosis. EXAM: MR VENOGRAM OF THE HEAD WITHOUT CONTRAST TECHNIQUE: Angiographic images of the intracranial venous structures were obtained using MRV technique without intravenous contrast.  COMPARISON:  Prior MRI from earlier the same day. FINDINGS: Normal flow related signal seen throughout the superior sagittal sinus to the level of the torcula. Torcula itself appears patent. Transverse and sigmoid sinuses appear patent as do the visualized proximal internal jugular veins. Straight sinus, vein of Galen, internal cerebral veins, and basal veins of Rosenthal appear patent. No evidence for dural sinus thrombosis. No appreciable dural sinus stenosis. IMPRESSION: Normal intracranial MRV. No evidence for dural sinus thrombosis. Electronically Signed   By: Jeannine Boga M.D.   On: 11/20/2020 01:11   NM Pulmonary Perfusion  Result Date: 11/19/2020 CLINICAL DATA:  Shortness of breath EXAM: NUCLEAR MEDICINE PERFUSION LUNG SCAN TECHNIQUE: Perfusion images were obtained in multiple projections after intravenous injection of radiopharmaceutical. Views: Anterior, posterior, left lateral, right lateral, RPO, LPO, RAO, LAO. RADIOPHARMACEUTICALS:  4.1 mCi Tc-58mMAA IV COMPARISON:  Chest radiograph November 18, 2020. FINDINGS: Radiotracer uptake is homogeneous and symmetric bilaterally. No perfusion defects evident. IMPRESSION: No perfusion defects evident. No findings indicative of pulmonary embolus. Electronically Signed   By: WLowella GripIII M.D.   On: 11/19/2020 13:04   DG Chest Port 1 View  Result Date: 11/18/2020 CLINICAL DATA:  Code stroke, questionable sepsis EXAM: PORTABLE CHEST 1 VIEW COMPARISON:  03/14/2019 FINDINGS: Single frontal view of the chest demonstrates marked enlargement the cardiac silhouette. There is central vascular congestion with bibasilar airspace disease and small bilateral effusions. No pneumothorax. No acute bony abnormalities. IMPRESSION: 1. Moderate congestive heart failure. Electronically Signed   By: MRanda NgoM.D.   On: 11/18/2020 19:45   ECHOCARDIOGRAM COMPLETE  Result Date: 11/19/2020    ECHOCARDIOGRAM REPORT   Patient Name:   Manuel BUSHWAYDate of  Exam: 11/19/2020 Medical Rec #:  [id number]      Height:       72.0 in Accession #:    [id number]     Weight:       238.1 lb Date of Birth:  [birth year]      BSA:          2.294 m Patient Age:    89years        BP:           138/86 mmHg Patient Gender: M               HR:           91 bpm. Exam Location:  Inpatient Procedure: 2D Echo, Cardiac Doppler, Color Doppler and 3D Echo Indications:    Aortic valve disorder I35.9                 Acutre respiratory distress R06.03                 Congestive Heart Failure I50.9  History:        Patient has prior history of Echocardiogram examinations, most                 recent 03/15/2019. Stroke, Previous echo read unicuspid aortic  valve; Risk Factors:Hypertension. Sacoidosis. CKD III.  Sonographer:    Merrie Roof RDCS Referring Phys: [place]  1. The aortic valve is clearly tricuspid. Suspect prior study report was an error.  2. Left ventricular ejection fraction, by estimation, is 60 to 65%. The left ventricle has normal function. The left ventricle has no regional wall motion abnormalities. Left ventricular diastolic parameters are consistent with Grade I diastolic dysfunction (impaired relaxation).  3. Right ventricular systolic function is normal. The right ventricular size is normal. Tricuspid regurgitation signal is inadequate for assessing PA pressure.  4. Left atrial size was mildly dilated.  5. The mitral valve is grossly normal. Trivial mitral valve regurgitation. No evidence of mitral stenosis.  6. The aortic valve is tricuspid. Aortic valve regurgitation is not visualized. No aortic stenosis is present.  7. The inferior vena cava is dilated in size with >50% respiratory variability, suggesting right atrial pressure of 8 mmHg. FINDINGS  Left Ventricle: Left ventricular ejection fraction, by estimation, is 60 to 65%. The left ventricle has normal function. The left ventricle has no regional wall motion abnormalities. The left  ventricular internal cavity size was normal in size. There is  no left ventricular hypertrophy. Left ventricular diastolic parameters are consistent with Grade I diastolic dysfunction (impaired relaxation). Right Ventricle: The right ventricular size is normal. No increase in right ventricular wall thickness. Right ventricular systolic function is normal. Tricuspid regurgitation signal is inadequate for assessing PA pressure. Left Atrium: Left atrial size was mildly dilated. Right Atrium: Right atrial size was normal in size. Pericardium: Trivial pericardial effusion is present. Mitral Valve: The mitral valve is grossly normal. Trivial mitral valve regurgitation. No evidence of mitral valve stenosis. Tricuspid Valve: The tricuspid valve is grossly normal. Tricuspid valve regurgitation is trivial. No evidence of tricuspid stenosis. Aortic Valve: The aortic valve is tricuspid. Aortic valve regurgitation is not visualized. No aortic stenosis is present. Aortic valve mean gradient measures 8.0 mmHg. Aortic valve peak gradient measures 17.6 mmHg. Aortic valve area, by VTI measures 2.72  cm. Pulmonic Valve: The pulmonic valve was grossly normal. Pulmonic valve regurgitation is not visualized. No evidence of pulmonic stenosis. Aorta: The aortic root is normal in size and structure. Venous: The inferior vena cava is dilated in size with greater than 50% respiratory variability, suggesting right atrial pressure of 8 mmHg. IAS/Shunts: The atrial septum is grossly normal.  LEFT VENTRICLE PLAX 2D LVIDd:         5.90 cm  Diastology LVIDs:         3.60 cm  LV e' medial:    4.57 cm/s LV PW:         1.00 cm  LV E/e' medial:  13.8 LV IVS:        0.80 cm  LV e' lateral:   9.14 cm/s LVOT diam:     2.10 cm  LV E/e' lateral: 6.9 LV SV:         101 LV SV Index:   44 LVOT Area:     3.46 cm  RIGHT VENTRICLE          IVC RV Basal diam:  5.10 cm  IVC diam: 2.70 cm RV Mid diam:    4.30 cm LEFT ATRIUM            Index       RIGHT ATRIUM            Index LA diam:      4.80 cm  2.09 cm/m  RA Area:     26.10 cm LA Vol (A2C): 102.0 ml 44.46 ml/m RA Volume:   92.60 ml  40.36 ml/m LA Vol (A4C): 120.0 ml 52.30 ml/m  AORTIC VALVE AV Area (Vmax):    2.80 cm AV Area (Vmean):   2.83 cm AV Area (VTI):     2.72 cm AV Vmax:           210.00 cm/s AV Vmean:          132.000 cm/s AV VTI:            0.370 m AV Peak Grad:      17.6 mmHg AV Mean Grad:      8.0 mmHg LVOT Vmax:         170.00 cm/s LVOT Vmean:        108.000 cm/s LVOT VTI:          0.291 m LVOT/AV VTI ratio: 0.79  AORTA Ao Root diam: 3.50 cm Ao Asc diam:  3.80 cm MITRAL VALVE MV Area (PHT): 3.42 cm     SHUNTS MV Decel Time: 222 msec     Systemic VTI:  0.29 m MV E velocity: 63.00 cm/s   Systemic Diam: 2.10 cm MV A velocity: 102.00 cm/s MV E/A ratio:  0.62 Eleonore Chiquito MD Electronically signed by Eleonore Chiquito MD Signature Date/Time: 11/19/2020/5:43:12 PM    Final    CT HEAD CODE STROKE WO CONTRAST  Result Date: 11/18/2020 CLINICAL DATA:  Code stroke. Neuro deficit, acute, stroke suspected. Additional history provided: Last known well 0845. Expressive aphasia. EXAM: CT HEAD WITHOUT CONTRAST TECHNIQUE: Contiguous axial images were obtained from the base of the skull through the vertex without intravenous contrast. COMPARISON:  Brain MRI 03/14/2019. CT angiogram head/neck 03/14/2019. Noncontrast head CT 03/14/2019. FINDINGS: Brain: Cerebral volume is normal for age. Moderate patchy and ill-defined hypoattenuation within the cerebral white matter which is nonspecific, but compatible with chronic small vessel ischemic disease. There is no acute intracranial hemorrhage. No demarcated cortical infarct is identified. No extra-axial fluid collection. No evidence of intracranial mass. No midline shift. Partially empty sella turcica. Vascular: No hyperdense vessel.  Atherosclerotic calcifications. Skull: Normal. Negative for fracture or focal lesion. Sinuses/Orbits: Visualized orbits show no acute finding.  Trace scattered paranasal sinus mucosal thickening. ASPECTS ([place] Stroke Program Early CT Score) - Ganglionic level infarction (caudate, lentiform nuclei, internal capsule, insula, M1-M3 cortex): 7 - Supraganglionic infarction (M4-M6 cortex): 3 Total score (0-10 with 10 being normal): 10 These results were called by telephone at the time of interpretation on 11/18/2020 at 8:13 pm to provider [facility] , who verbally acknowledged these results. IMPRESSION: No evidence of acute infarct or acute intracranial hemorrhage. ASPECTS is 10. Redemonstrated moderate cerebral white matter chronic small vessel ischemic disease. Minimal scattered paranasal sinus mucosal thickening. Electronically Signed   By: Kellie Simmering DO   On: 11/18/2020 20:14   CT ANGIO HEAD CODE STROKE  Result Date: 11/18/2020 CLINICAL DATA:  Stroke/TIA, assess extracranial arteries. Stroke/TIA, assess intracranial arteries. EXAM: CT ANGIOGRAPHY HEAD AND NECK TECHNIQUE: Multidetector CT imaging of the head and neck was performed using the standard protocol during bolus administration of intravenous contrast. Multiplanar CT image reconstructions and MIPs were obtained to evaluate the vascular anatomy. Carotid stenosis measurements (when applicable) are obtained utilizing NASCET criteria, using the distal internal carotid diameter as the denominator. CONTRAST:  60 mL Omnipaque 350 intravenous contrast. COMPARISON:  CT angiogram head/neck 03/14/2019. FINDINGS: CTA NECK FINDINGS Aortic arch: Standard aortic  branching. No hemodynamically significant innominate or proximal subclavian artery stenosis. Right carotid system: CCA and ICA patent within the neck without stenosis. Tortuosity of the mid to distal cervical ICA. Minimal calcified plaque at the carotid bifurcations bilaterally. Left carotid system: CCA and ICA patent within the neck without stenosis. Tortuosity of the mid to distal cervical ICA. Vertebral arteries: Vertebral arteries  codominant and patent within the neck without stenosis. Skeleton: Cervical spondylosis with multilevel disc space narrowing, disc bulges, uncovertebral hypertrophy and facet arthrosis. No acute bony abnormality or aggressive osseous lesion. Other neck: No neck mass or cervical lymphadenopathy. Thyroid unremarkable. Upper chest: Interlobular septal thickening within the imaged lung apices. Partially imaged layering bilateral pleural effusions. 7 mm right upper lobe pulmonary nodule (series 9, image 342). Redemonstrated calcified mediastinal lymph nodes. Review of the MIP images confirms the above findings CTA HEAD FINDINGS Anterior circulation: The intracranial internal carotid arteries are patent. Mild calcified plaque within both vessels without stenosis. The M1 middle cerebral arteries are patent. No M2 proximal branch occlusion or high-grade proximal stenosis is identified. The left A1 is dominant and the right A1 is diminutive. The left ACA remains dominant with an azygos type ACA appearance. No intracranial aneurysm is identified. Posterior circulation: The intracranial vertebral arteries are patent. The basilar artery is patent. The posterior cerebral arteries are patent. A right posterior communicating artery is present. The left posterior communicating artery is hypoplastic or absent. Venous sinuses: Within the limitations of contrast timing, no convincing thrombus. Anatomic variants: As described. Other: There is asymmetric prominence and enhancement of the left superior ophthalmic vein. No associated asymmetry of the cavernous sinuses is appreciated. Review of the MIP images confirms the above findings These results were called by telephone at the time of interpretation on 11/18/2020 at 8:40 pm to provider [facility] , who verbally acknowledged these results. IMPRESSION: CTA neck: 1. The bilateral common carotid, internal carotid and vertebral arteries are patent within the neck without stenosis. 2.  Interlobular septal thickening within the imaged lung apices, nonspecific but possibly reflecting interstitial edema. Additionally, there are partially imaged layering bilateral pleural effusions. 3. 7 mm right upper lobe pulmonary nodule. Non-contrast chest CT at 6-12 months is recommended. If the nodule is stable at time of repeat CT, then future CT at 18-24 months (from today's scan) is considered optional for low-risk patients, but is recommended for high-risk patients. This recommendation follows the consensus statement: Guidelines for Management of Incidental Pulmonary Nodules Detected on CT Images: From the Fleischner Society 2017; Radiology 2017; 284:228-243. CTA head: 1. No intracranial large vessel occlusion or proximal high-grade arterial stenosis. 2. Asymmetric prominence and enhancement of the left superior ophthalmic vein, new from the prior CTA head/neck of 03/14/2019. No associated asymmetry of the cavernous sinuses is appreciated and this finding is indeterminate in etiology. Electronically Signed   By: Kellie Simmering DO   On: 11/18/2020 20:54   CT ANGIO NECK CODE STROKE  Result Date: 11/18/2020 CLINICAL DATA:  Stroke/TIA, assess extracranial arteries. Stroke/TIA, assess intracranial arteries. EXAM: CT ANGIOGRAPHY HEAD AND NECK TECHNIQUE: Multidetector CT imaging of the head and neck was performed using the standard protocol during bolus administration of intravenous contrast. Multiplanar CT image reconstructions and MIPs were obtained to evaluate the vascular anatomy. Carotid stenosis measurements (when applicable) are obtained utilizing NASCET criteria, using the distal internal carotid diameter as the denominator. CONTRAST:  60 mL Omnipaque 350 intravenous contrast. COMPARISON:  CT angiogram head/neck 03/14/2019. FINDINGS: CTA NECK FINDINGS Aortic arch: Standard aortic branching.  No hemodynamically significant innominate or proximal subclavian artery stenosis. Right carotid system: CCA and ICA  patent within the neck without stenosis. Tortuosity of the mid to distal cervical ICA. Minimal calcified plaque at the carotid bifurcations bilaterally. Left carotid system: CCA and ICA patent within the neck without stenosis. Tortuosity of the mid to distal cervical ICA. Vertebral arteries: Vertebral arteries codominant and patent within the neck without stenosis. Skeleton: Cervical spondylosis with multilevel disc space narrowing, disc bulges, uncovertebral hypertrophy and facet arthrosis. No acute bony abnormality or aggressive osseous lesion. Other neck: No neck mass or cervical lymphadenopathy. Thyroid unremarkable. Upper chest: Interlobular septal thickening within the imaged lung apices. Partially imaged layering bilateral pleural effusions. 7 mm right upper lobe pulmonary nodule (series 9, image 342). Redemonstrated calcified mediastinal lymph nodes. Review of the MIP images confirms the above findings CTA HEAD FINDINGS Anterior circulation: The intracranial internal carotid arteries are patent. Mild calcified plaque within both vessels without stenosis. The M1 middle cerebral arteries are patent. No M2 proximal branch occlusion or high-grade proximal stenosis is identified. The left A1 is dominant and the right A1 is diminutive. The left ACA remains dominant with an azygos type ACA appearance. No intracranial aneurysm is identified. Posterior circulation: The intracranial vertebral arteries are patent. The basilar artery is patent. The posterior cerebral arteries are patent. A right posterior communicating artery is present. The left posterior communicating artery is hypoplastic or absent. Venous sinuses: Within the limitations of contrast timing, no convincing thrombus. Anatomic variants: As described. Other: There is asymmetric prominence and enhancement of the left superior ophthalmic vein. No associated asymmetry of the cavernous sinuses is appreciated. Review of the MIP images confirms the above  findings These results were called by telephone at the time of interpretation on 11/18/2020 at 8:40 pm to provider [facility] , who verbally acknowledged these results. IMPRESSION: CTA neck: 1. The bilateral common carotid, internal carotid and vertebral arteries are patent within the neck without stenosis. 2. Interlobular septal thickening within the imaged lung apices, nonspecific but possibly reflecting interstitial edema. Additionally, there are partially imaged layering bilateral pleural effusions. 3. 7 mm right upper lobe pulmonary nodule. Non-contrast chest CT at 6-12 months is recommended. If the nodule is stable at time of repeat CT, then future CT at 18-24 months (from today's scan) is considered optional for low-risk patients, but is recommended for high-risk patients. This recommendation follows the consensus statement: Guidelines for Management of Incidental Pulmonary Nodules Detected on CT Images: From the Fleischner Society 2017; Radiology 2017; 284:228-243. CTA head: 1. No intracranial large vessel occlusion or proximal high-grade arterial stenosis. 2. Asymmetric prominence and enhancement of the left superior ophthalmic vein, new from the prior CTA head/neck of 03/14/2019. No associated asymmetry of the cavernous sinuses is appreciated and this finding is indeterminate in etiology. Electronically Signed   By: Kellie Simmering DO   On: 11/18/2020 20:54   DG FL GUIDED LUMBAR PUNCTURE  Result Date: 11/19/2020 CLINICAL DATA:  Acute encephalopathy EXAM: DIAGNOSTIC LUMBAR PUNCTURE UNDER FLUOROSCOPIC GUIDANCE COMPARISON:  MRI head 11/19/2020.  MRI lumbar spine 12/18/2014 FLUOROSCOPY TIME:  Fluoroscopy Time:  0 minutes 6 seconds Radiation Exposure Index (if provided by the fluoroscopic device): Number of Acquired Spot Images: 1 PROCEDURE: Informed consent was obtained from the patient's wife prior to the procedure, including potential complications of headache, allergy, and pain. With the patient  prone, the lower back was prepped with Betadine. 1% Lidocaine was used for local anesthesia. Lumbar puncture was performed at the L4-5  level using a 22 gauge needle with return of clear CSF with an opening pressure of 46.5 cm water. 11 ml of CSF were obtained for laboratory studies. The patient tolerated the procedure well and there were no apparent complications. IMPRESSION: Successful lumbar puncture. Elevated opening pressure 46.5 cm water. Clear CSF. Electronically Signed   By: Franchot Gallo M.D.   On: 11/19/2020 13:38    Scheduled Meds: . atorvastatin  20 mg Oral Daily  . diltiazem  360 mg Oral Daily  . enoxaparin (LOVENOX) injection  40 mg Subcutaneous Q24H  . hydrALAZINE  50 mg Oral BID  . labetalol  300 mg Oral Q8H  . LORazepam  0.5 mg Intravenous Once  . phenylephrine  1 drop Both Eyes Once  . predniSONE  5 mg Oral Q breakfast  . tamsulosin  0.4 mg Oral Daily  . thiamine injection  100 mg Intravenous Daily   Or  . thiamine  100 mg Oral Daily  . tropicamide  1 drop Both Eyes Once   Continuous Infusions: . vancomycin 750 mg (11/20/20 1213)     LOS: 0 days   Time spent: 35 minutes.  Patrecia Pour, MD [facility] [email or web address] 11/20/2020, 2:57 PM

## 2020-11-20 NOTE — Progress Notes (Signed)
[facility]  Date of Admission:  11/18/2020     Total days of antibiotics 3         ASSESSMENT:  [name] work up thus far has been mostly unremarkable with MRI with no emergent findings. Confused and lethargic today possibly related to lorazepam last evening and decreased amount of sleep. Does appear to have significant sleep apnea upon observation which brings the concern for hypoxia.CSF is unremarkable for meningitis. De-escalate antibiotic therapy and will continue with vancomycin as he had a positive blood culture. Opening pressure elevated which does not appear to fit with his clinical picture. Will continue to monitor mental status with vancomycin and await CSF lab work. Right now there is no clear evidence of infection.    PLAN:  1. De-escalate antibiotic therapy to vancomycin.  2. Monitor CSF cultures and testing.  3. Monitor blood cultures for clearance of previous MRSA.  4. Continue to monitor mental status.   Principal Problem:   Gram-positive bacteremia Active Problems:   Sarcoidosis   Essential hypertension   Tobacco abuse   OSA (obstructive sleep apnea)   Acute encephalopathy   ARF (acute renal failure) (HCC)   Acute respiratory failure (HCC)   Cranial neuropathy   . atorvastatin  20 mg Oral Daily  . diltiazem  360 mg Oral Daily  . enoxaparin (LOVENOX) injection  40 mg Subcutaneous Q24H  . hydrALAZINE  50 mg Oral BID  . labetalol  300 mg Oral Q8H  . LORazepam  0.5 mg Intravenous Once  . phenylephrine  1 drop Both Eyes Once  . predniSONE  5 mg Oral Q breakfast  . tamsulosin  0.4 mg Oral Daily  . thiamine injection  100 mg Intravenous Daily   Or  . thiamine  100 mg Oral Daily  . tropicamide  1 drop Both Eyes Once    SUBJECTIVE: Afebrile overnight. Received lorazepam late last night for MRI secondary to claustrophobia. Wife sitting in chair at bedside.   Allergies  Allergen Reactions  . Codeine Nausea Only  .  Hydromorphone Other (See Comments)    hallucinations      Review of Systems: Review of Systems  Unable to perform ROS: Mental status change      OBJECTIVE: Vitals:   11/20/20 0000 11/20/20 0640 11/20/20 0658 11/20/20 0815  BP: (!) 155/89 (!) 173/80  (!) 162/88  Pulse:  69 68 (!) 55  Resp:  20 18 (!) 22  Temp:  98.3 F (36.8 C)  98.4 F (36.9 C)  TempSrc:  Oral  Oral  SpO2:   100% 96%  Weight:      Height:       Body mass index is 32.29 kg/m.  Physical Exam Constitutional:      General: He is not in acute distress.    Appearance: He is well-developed. He is obese.  Cardiovascular:     Rate and Rhythm: Normal rate and regular rhythm.     Heart sounds: Normal heart sounds.  Pulmonary:     Effort: Accessory muscle usage present.     Breath sounds: Normal breath sounds.  Skin:    General: Skin is warm and dry.  Neurological:     Mental Status: He is confused.     Comments: Lethargic. Opens eyes to tactile stimulation and quickly falls back to sleep.   Psychiatric:        Behavior: Behavior normal.        Thought Content: Thought content  normal.        Judgment: Judgment normal.     Lab Results Lab Results  Component Value Date   WBC 6.4 11/20/2020   HGB 11.9 (L) 11/20/2020   HCT 35.5 (L) 11/20/2020   MCV 90.8 11/20/2020   PLT 200 11/20/2020    Lab Results  Component Value Date   CREATININE 2.02 (H) 11/20/2020   BUN 14 11/20/2020   NA 142 11/20/2020   K 4.2 11/20/2020   CL 103 11/20/2020   CO2 31 11/20/2020    Lab Results  Component Value Date   ALT 54 (H) 11/20/2020   AST 31 11/20/2020   ALKPHOS 83 11/20/2020   BILITOT 1.1 11/20/2020     Microbiology: Recent Results (from the past 240 hour(s))  Urine culture     Status: Abnormal   Collection Time: 11/18/20  6:58 PM   Specimen: In/Out Cath Urine  Result Value Ref Range Status   Specimen Description IN/OUT CATH URINE  Final   Special Requests   Final    NONE Performed at [facility], [address] [place], [place] [address]    Culture MULTIPLE SPECIES PRESENT, SUGGEST RECOLLECTION (A)  Final   Report Status 11/20/2020 FINAL  Final  Culture, blood (routine x 2)     Status: Abnormal (Preliminary result)   Collection Time: 11/18/20  6:59 PM   Specimen: BLOOD  Result Value Ref Range Status   Specimen Description BLOOD LEFT ANTECUBITAL  Final   Special Requests   Final    BOTTLES DRAWN AEROBIC AND ANAEROBIC Blood Culture adequate volume   Culture  Setup Time   Final    GRAM POSITIVE COCCI IN CLUSTERS IN BOTH AEROBIC AND ANAEROBIC BOTTLES Organism ID to follow CRITICAL RESULT CALLED TO, READ BACK BY AND VERIFIED WITH: T JACKY PHARMD 1418 11/19/20 A BROWNING    Culture (A)  Final    STAPHYLOCOCCUS AUREUS SUSCEPTIBILITIES TO FOLLOW STAPHYLOCOCCUS EPIDERMIDIS THE SIGNIFICANCE OF ISOLATING THIS ORGANISM FROM A SINGLE SET OF BLOOD CULTURES WHEN MULTIPLE SETS ARE DRAWN IS UNCERTAIN. PLEASE NOTIFY THE MICROBIOLOGY DEPARTMENT WITHIN ONE WEEK IF SPECIATION AND SENSITIVITIES ARE REQUIRED. Performed at [facility], [place] [address], [place], [place] [address]    Report Status PENDING  Incomplete  Blood Culture ID Panel (Reflexed)     Status: Abnormal   Collection Time: 11/18/20  6:59 PM  Result Value Ref Range Status   Enterococcus faecalis NOT DETECTED NOT DETECTED Final   Enterococcus Faecium NOT DETECTED NOT DETECTED Final   Listeria monocytogenes NOT DETECTED NOT DETECTED Final   Staphylococcus species DETECTED (A) NOT DETECTED Final    Comment: CRITICAL RESULT CALLED TO, READ BACK BY AND VERIFIED WITH: T JACKY PHARMD 1418 11/19/20 A BROWNING    Staphylococcus aureus (BCID) DETECTED (A) NOT DETECTED Final    Comment: CRITICAL RESULT CALLED TO, READ BACK BY AND VERIFIED WITH: T JACKY PHARMD 1418 11/19/20 A BROWNING    Staphylococcus epidermidis DETECTED (A) NOT DETECTED Final    Comment: CRITICAL RESULT CALLED TO, READ BACK BY AND VERIFIED WITH: Lockie Mola  PHARMD 1418 11/19/20 A BROWNING    Staphylococcus lugdunensis NOT DETECTED NOT DETECTED Final   Streptococcus species NOT DETECTED NOT DETECTED Final   Streptococcus agalactiae NOT DETECTED NOT DETECTED Final   Streptococcus pneumoniae NOT DETECTED NOT DETECTED Final   Streptococcus pyogenes NOT DETECTED NOT DETECTED Final   A.calcoaceticus-baumannii NOT DETECTED NOT DETECTED Final   Bacteroides fragilis NOT DETECTED NOT DETECTED  Final   Enterobacterales NOT DETECTED NOT DETECTED Final   Enterobacter cloacae complex NOT DETECTED NOT DETECTED Final   Escherichia coli NOT DETECTED NOT DETECTED Final   Klebsiella aerogenes NOT DETECTED NOT DETECTED Final   Klebsiella oxytoca NOT DETECTED NOT DETECTED Final   Klebsiella pneumoniae NOT DETECTED NOT DETECTED Final   Proteus species NOT DETECTED NOT DETECTED Final   Salmonella species NOT DETECTED NOT DETECTED Final   Serratia marcescens NOT DETECTED NOT DETECTED Final   Haemophilus influenzae NOT DETECTED NOT DETECTED Final   Neisseria meningitidis NOT DETECTED NOT DETECTED Final   Pseudomonas aeruginosa NOT DETECTED NOT DETECTED Final   Stenotrophomonas maltophilia NOT DETECTED NOT DETECTED Final   Candida albicans NOT DETECTED NOT DETECTED Final   Candida auris NOT DETECTED NOT DETECTED Final   Candida glabrata NOT DETECTED NOT DETECTED Final   Candida krusei NOT DETECTED NOT DETECTED Final   Candida parapsilosis NOT DETECTED NOT DETECTED Final   Candida tropicalis NOT DETECTED NOT DETECTED Final   Cryptococcus neoformans/gattii NOT DETECTED NOT DETECTED Final   Methicillin resistance mecA/C DETECTED (A) NOT DETECTED Final    Comment: CRITICAL RESULT CALLED TO, READ BACK BY AND VERIFIED WITH: T JACKY PHARMD 1418 11/19/20 A BROWNING    Meth resistant mecA/C and MREJ NOT DETECTED NOT DETECTED Final    Comment: Performed at [facility], [address] [place], [place] [address]  Culture, blood (routine x 2)     Status: None  (Preliminary result)   Collection Time: 11/18/20  7:11 PM   Specimen: BLOOD  Result Value Ref Range Status   Specimen Description BLOOD RIGHT ANTECUBITAL  Final   Special Requests   Final    BOTTLES DRAWN AEROBIC AND ANAEROBIC Blood Culture adequate volume   Culture   Final    NO GROWTH 2 DAYS Performed at [facility], [place] [address], [place], [place] [address]    Report Status PENDING  Incomplete  Resp Panel by RT-PCR (Flu A&B, Covid) Nasopharyngeal Swab     Status: None   Collection Time: 11/18/20  9:25 PM   Specimen: Nasopharyngeal Swab; Nasopharyngeal(NP) swabs in vial transport medium  Result Value Ref Range Status   SARS Coronavirus 2 by RT PCR NEGATIVE NEGATIVE Final    Comment: (NOTE) SARS-CoV-2 target nucleic acids are NOT DETECTED.  The SARS-CoV-2 RNA is generally detectable in upper respiratory specimens during the acute phase of infection. The lowest concentration of SARS-CoV-2 viral copies this assay can detect is 138 copies/mL. A negative result does not preclude SARS-Cov-2 infection and should not be used as the sole basis for treatment or other patient management decisions. A negative result may occur with  improper specimen collection/handling, submission of specimen other than nasopharyngeal swab, presence of viral mutation(s) within the areas targeted by this assay, and inadequate number of viral copies(<138 copies/mL). A negative result must be combined with clinical observations, patient history, and epidemiological information. The expected result is Negative.  Fact Sheet for Patients:  EntrepreneurPulse.com.au  Fact Sheet for Healthcare Providers:  IncredibleEmployment.be  This test is no t yet approved or cleared by the [place] FDA and  has been authorized for detection and/or diagnosis of SARS-CoV-2 by FDA under an Emergency Use Authorization (EUA). This EUA will remain  in effect (meaning this test can  be used) for the duration of the COVID-19 declaration under Section 564(b)(1) of the Act, 21 U.S.C.section 360bbb-3(b)(1), unless the authorization is terminated  or revoked sooner.  Influenza A by PCR NEGATIVE NEGATIVE Final   Influenza B by PCR NEGATIVE NEGATIVE Final    Comment: (NOTE) The Xpert Xpress SARS-CoV-2/FLU/RSV plus assay is intended as an aid in the diagnosis of influenza from Nasopharyngeal swab specimens and should not be used as a sole basis for treatment. Nasal washings and aspirates are unacceptable for Xpert Xpress SARS-CoV-2/FLU/RSV testing.  Fact Sheet for Patients: EntrepreneurPulse.com.au  Fact Sheet for Healthcare Providers: IncredibleEmployment.be  This test is not yet approved or cleared by the [place] FDA and has been authorized for detection and/or diagnosis of SARS-CoV-2 by FDA under an Emergency Use Authorization (EUA). This EUA will remain in effect (meaning this test can be used) for the duration of the COVID-19 declaration under Section 564(b)(1) of the Act, 21 U.S.C. section 360bbb-3(b)(1), unless the authorization is terminated or revoked.  Performed at [facility], [place] [address] [place], [place] [address]   CSF culture w Gram Stain     Status: None (Preliminary result)   Collection Time: 11/19/20  1:22 PM   Specimen: CSF  Result Value Ref Range Status   Specimen Description CSF  Final   Special Requests NONE  Final   Gram Stain   Final    WBC PRESENT, PREDOMINANTLY MONONUCLEAR NO ORGANISMS SEEN CYTOSPIN SMEAR    Culture   Final    NO GROWTH < 24 HOURS Performed at [facility], [place] [address] [place], [place] [address]    Report Status PENDING  Incomplete  Culture, blood (Routine X 2) w Reflex to ID Panel     Status: None (Preliminary result)   Collection Time: 11/19/20  6:18 PM   Specimen: BLOOD RIGHT ARM  Result Value Ref Range Status   Specimen Description BLOOD  RIGHT ARM  Final   Special Requests   Final    BOTTLES DRAWN AEROBIC AND ANAEROBIC Blood Culture adequate volume   Culture   Final    NO GROWTH < 12 HOURS Performed at [facility], [place] [address], [place], [place] [address]    Report Status PENDING  Incomplete  Culture, blood (Routine X 2) w Reflex to ID Panel     Status: None (Preliminary result)   Collection Time: 11/19/20  6:28 PM   Specimen: BLOOD RIGHT HAND  Result Value Ref Range Status   Specimen Description BLOOD RIGHT HAND  Final   Special Requests   Final    BOTTLES DRAWN AEROBIC AND ANAEROBIC Blood Culture results may not be optimal due to an inadequate volume of blood received in culture bottles   Culture   Final    NO GROWTH < 12 HOURS Performed at [facility], [place] [address] [place], [place] [address]    Report Status PENDING  Incomplete     Terri Piedra, NP [facility]  11/20/2020  11:51 AM

## 2020-11-20 NOTE — Consult Note (Signed)
Reason for consult:  HPI: [name] is an 65 y.o. male with known sarcoidosis p/w encephalopathy and L upper facial weakness we are asked to evaluate for papilledema. On exam he was found to have possible  periorbital swelling and conjunctival injection OS. On imaging there was a question of enhancement of L supraorbital vein on CTA. No e/o meningitis on LP but opening pressure was 46.   The patient and his wife at bedside are interviewed.  He denies any new or recent eye or vision complaints.  No diplopia.  No pain.     POH:  Sees Dr. Kathlen Mody at [facility].  No significant past ocular hx    Past Medical History:  Diagnosis Date  . Back pain   . CKD (chronic kidney disease), stage III ([place])   . CVA (cerebral infarction)   . Diastolic dysfunction   . ED (erectile dysfunction)   . GERD (gastroesophageal reflux disease)   . Hypercalcemia   . Hyperlipidemia   . Hypertension   . Insomnia   . Long-term use of high-risk medication   . Nephrocalcinosis   . Nephrolithiasis   . Obesity   . Osteopenia   . Pancreatitis   . PE (pulmonary embolism)   . Proteinuria   . Sarcoidosis   . Smoker    Past Surgical History:  Procedure Laterality Date  . ABDOMINAL EXPLORATION SURGERY    . BACK SURGERY    . CHOLECYSTECTOMY    . SHOULDER ARTHROSCOPY Right 10/30/2020   Procedure: ARTHROSCOPY SHOULDER WITH EXTENSIVE DEBRIDEMENT;  Surgeon: Mcarthur Rossetti, MD;  Location: [place];  Service: Orthopedics;  Laterality: Right;  . SHOULDER SURGERY    . TRACHEOSTOMY     closed   Family History  Problem Relation Age of Onset  . Hypertension Father   . CVA Father   . Lung cancer Father   . Alzheimer's disease Mother   . Hypertension Sister    Current Facility-Administered Medications  Medication Dose Route Frequency Provider Last Rate Last Admin  . 0.9 %  sodium chloride infusion   Intravenous Continuous Onnie Boer Q, RPH-CPP 50 mL/hr at 11/19/20 1623 New Bag at  11/19/20 1623  . acetaminophen (TYLENOL) tablet 650 mg  650 mg Oral Q6H PRN Rise Patience, MD       Or  . acetaminophen (TYLENOL) suppository 650 mg  650 mg Rectal Q6H PRN Rise Patience, MD      . albuterol (VENTOLIN HFA) 108 (90 Base) MCG/ACT inhaler 2 puff  2 puff Inhalation Q4H PRN Rise Patience, MD      . atorvastatin (LIPITOR) tablet 20 mg  20 mg Oral Daily Rise Patience, MD   20 mg at 11/20/20 1131  . diltiazem (CARDIZEM CD) 24 hr capsule 360 mg  360 mg Oral Daily Rise Patience, MD   360 mg at 11/20/20 1133  . enoxaparin (LOVENOX) injection 40 mg  40 mg Subcutaneous Q24H von Dohlen, Haley B, RPH   40 mg at 11/20/20 1136  . hydrALAZINE (APRESOLINE) tablet 50 mg  50 mg Oral BID Rise Patience, MD   50 mg at 11/20/20 1133  . labetalol (NORMODYNE) tablet 300 mg  300 mg Oral Q8H Rise Patience, MD   300 mg at 11/20/20 0640  . LORazepam (ATIVAN) injection 0.5 mg  0.5 mg Intravenous Once Zierle-Ghosh, Asia B, DO      . phenylephrine (MYDFRIN) 2.5 % ophthalmic solution 1 drop  1 drop  Both Eyes Once [name] Apo, MD      . predniSONE (DELTASONE) tablet 5 mg  5 mg Oral Q breakfast Rise Patience, MD   5 mg at 11/20/20 1132  . tamsulosin (FLOMAX) capsule 0.4 mg  0.4 mg Oral Daily Rise Patience, MD   0.4 mg at 11/20/20 1132  . thiamine (B-1) injection 100 mg  100 mg Intravenous Daily Rise Patience, MD   100 mg at 11/18/20 2243   Or  . thiamine tablet 100 mg  100 mg Oral Daily Rise Patience, MD   100 mg at 11/20/20 1133  . tropicamide (MYDRIACYL) 1 % ophthalmic solution 1 drop  1 drop Both Eyes Once [name] Apo, MD      . vancomycin (VANCOREADY) IVPB 750 mg/150 mL  750 mg Intravenous Q12H Rise Patience, MD 750 mL/hr at 11/20/20 1213 750 mg at 11/20/20 1213   Allergies  Allergen Reactions  . Codeine Nausea Only  . Hydromorphone Other (See Comments)    hallucinations    Social History   Socioeconomic History  .  Marital status: Married    Spouse name: Not on file  . Number of children: Not on file  . Years of education: Not on file  . Highest education level: Not on file  Occupational History  . Not on file  Tobacco Use  . Smoking status: Former Smoker    Types: Cigarettes  . Smokeless tobacco: Never Used  . Tobacco comment: quit less than five years ago  Substance and Sexual Activity  . Alcohol use: No    Alcohol/week: 0.0 standard drinks  . Drug use: No  . Sexual activity: Yes  Other Topics Concern  . Not on file  Social History Narrative  . Not on file   Social Determinants of Health   Financial Resource Strain: Not on file  Food Insecurity: Not on file  Transportation Needs: Not on file  Physical Activity: Not on file  Stress: Not on file  Social Connections: Not on file  Intimate Partner Violence: Not on file    Review of systems: ROS per HPI  Physical Exam:  Blood pressure (!) 162/88, pulse (!) 55, temperature 98.4 F (36.9 C), temperature source Oral, resp. rate (!) 22, height 6' (1.829 m), weight 108 kg, SpO2 96 %.   VA cc (OTC rdrs):  OD 20/40  OS  20/30  Pupils:   OD round, reactive to light, no APD            OS round, reactive to light, no APD   Motility:  OD full ductions  OS full ductions  Balance/alignment:  Ortho by Luiz Ochoa   Bedside examination:                                 OD                                       External/adnexa: Normal                                      Lids/lashes:        Normal  Conjunctiva        White, quiet        Cornea:              Clear                  AC:                     Deep, quiet                                Iris:                     Normal        Lens:                  Clear                                       OS                                       External/adnexa: Normal                                      Lids/lashes:        Normal                                       Conjunctiva        White, quiet        Cornea:              Clear                  AC:                     Deep, quiet                                Iris:                     Normal        Lens:                  Clear       Dilated fundus exam: (Neo 2.5; Myd 1%)      OD Vitreous            Clear, quiet                                Optic Disc:       Normal, perfused                      Macula:             Flat  Vessels:           Normal caliber,distribution         Periphery:         Flat, attached                                      OS Vitreous            Clear, quiet                                Optic Disc:       Normal, perfused                      Macula:             Flat                                            Vessels:           Normal caliber,distribution         Periphery:         Flat, attached        Labs/studies: Results for orders placed or performed during the hospital encounter of 11/18/20 (from the past 48 hour(s))  Comprehensive metabolic panel     Status: Abnormal   Collection Time: 11/18/20  6:58 PM  Result Value Ref Range   Sodium 137 135 - 145 mmol/L   Potassium 4.2 3.5 - 5.1 mmol/L   Chloride 98 98 - 111 mmol/L   CO2 31 22 - 32 mmol/L   Glucose, Bld 84 70 - 99 mg/dL    Comment: Glucose reference range applies only to samples taken after fasting for at least 8 hours.   BUN 14 8 - 23 mg/dL   Creatinine, Ser 2.31 (H) 0.61 - 1.24 mg/dL   Calcium 8.8 (L) 8.9 - 10.3 mg/dL   Total Protein 6.3 (L) 6.5 - 8.1 g/dL   Albumin 3.4 (L) 3.5 - 5.0 g/dL   AST 63 (H) 15 - 41 U/L   ALT 74 (H) 0 - 44 U/L   Alkaline Phosphatase 105 38 - 126 U/L   Total Bilirubin 1.1 0.3 - 1.2 mg/dL   GFR, Estimated 31 (L) >60 mL/min    Comment: (NOTE) Calculated using the CKD-EPI Creatinine Equation (2021)    Anion gap 8 5 - 15    Comment: Performed at [facility], [place] [address], [place], [place] [address]  CBC  WITH DIFFERENTIAL     Status: Abnormal   Collection Time: 11/18/20  6:58 PM  Result Value Ref Range   WBC 8.0 4.0 - 10.5 K/uL   RBC 4.20 (L) 4.22 - 5.81 MIL/uL   Hemoglobin 13.0 13.0 - 17.0 g/dL   HCT 39.4 39.0 - 52.0 %   MCV 93.8 80.0 - 100.0 fL   MCH 31.0 26.0 - 34.0 pg   MCHC 33.0 30.0 - 36.0 g/dL   RDW 14.4 11.5 - 15.5 %   Platelets 244 150 - 400 K/uL   nRBC 0.0 0.0 - 0.2 %   Neutrophils Relative % 73 %   Neutro Abs 5.8 1.7 - 7.7 K/uL   Lymphocytes Relative 15 %  Lymphs Abs 1.2 0.7 - 4.0 K/uL   Monocytes Relative 8 %   Monocytes Absolute 0.7 0.1 - 1.0 K/uL   Eosinophils Relative 3 %   Eosinophils Absolute 0.2 0.0 - 0.5 K/uL   Basophils Relative 1 %   Basophils Absolute 0.1 0.0 - 0.1 K/uL   Immature Granulocytes 0 %   Abs Immature Granulocytes 0.02 0.00 - 0.07 K/uL    Comment: Performed at [facility], [place] [address], [place], [place] [address]  Protime-INR     Status: None   Collection Time: 11/18/20  6:58 PM  Result Value Ref Range   Prothrombin Time 14.3 11.4 - 15.2 seconds   INR 1.2 0.8 - 1.2    Comment: (NOTE) INR goal varies based on device and disease states. Performed at [facility], [place] [address], [place], [place] [address]   APTT     Status: None   Collection Time: 11/18/20  6:58 PM  Result Value Ref Range   aPTT 34 24 - 36 seconds    Comment: Performed at [facility] [address], [place], [place] [address]  Urine culture     Status: Abnormal   Collection Time: 11/18/20  6:58 PM   Specimen: In/Out Cath Urine  Result Value Ref Range   Specimen Description IN/OUT CATH URINE    Special Requests      NONE Performed at [facility], [place] [address] [place], [place] [address]    Culture MULTIPLE SPECIES PRESENT, SUGGEST RECOLLECTION (A)    Report Status 11/20/2020 FINAL   Culture, blood (routine x 2)     Status: Abnormal (Preliminary result)   Collection Time: 11/18/20  6:59 PM   Specimen: BLOOD  Result Value Ref Range    Specimen Description BLOOD LEFT ANTECUBITAL    Special Requests      BOTTLES DRAWN AEROBIC AND ANAEROBIC Blood Culture adequate volume   Culture  Setup Time      GRAM POSITIVE COCCI IN CLUSTERS IN BOTH AEROBIC AND ANAEROBIC BOTTLES Organism ID to follow CRITICAL RESULT CALLED TO, READ BACK BY AND VERIFIED WITH: T JACKY PHARMD 1418 11/19/20 A BROWNING    Culture (A)     STAPHYLOCOCCUS AUREUS SUSCEPTIBILITIES TO FOLLOW STAPHYLOCOCCUS EPIDERMIDIS THE SIGNIFICANCE OF ISOLATING THIS ORGANISM FROM A SINGLE SET OF BLOOD CULTURES WHEN MULTIPLE SETS ARE DRAWN IS UNCERTAIN. PLEASE NOTIFY THE MICROBIOLOGY DEPARTMENT WITHIN ONE WEEK IF SPECIATION AND SENSITIVITIES ARE REQUIRED. Performed at [facility], [place] [address], [place], [place] [address]    Report Status PENDING   Blood Culture ID Panel (Reflexed)     Status: Abnormal   Collection Time: 11/18/20  6:59 PM  Result Value Ref Range   Enterococcus faecalis NOT DETECTED NOT DETECTED   Enterococcus Faecium NOT DETECTED NOT DETECTED   Listeria monocytogenes NOT DETECTED NOT DETECTED   Staphylococcus species DETECTED (A) NOT DETECTED    Comment: CRITICAL RESULT CALLED TO, READ BACK BY AND VERIFIED WITH: T JACKY PHARMD 1418 11/19/20 A BROWNING    Staphylococcus aureus (BCID) DETECTED (A) NOT DETECTED    Comment: CRITICAL RESULT CALLED TO, READ BACK BY AND VERIFIED WITH: T JACKY PHARMD 1418 11/19/20 A BROWNING    Staphylococcus epidermidis DETECTED (A) NOT DETECTED    Comment: CRITICAL RESULT CALLED TO, READ BACK BY AND VERIFIED WITH: T JACKY PHARMD 1418 11/19/20 A BROWNING    Staphylococcus lugdunensis NOT DETECTED NOT DETECTED   Streptococcus species NOT DETECTED NOT DETECTED  Streptococcus agalactiae NOT DETECTED NOT DETECTED   Streptococcus pneumoniae NOT DETECTED NOT DETECTED   Streptococcus pyogenes NOT DETECTED NOT DETECTED   A.calcoaceticus-baumannii NOT DETECTED NOT DETECTED   Bacteroides fragilis NOT DETECTED NOT DETECTED    Enterobacterales NOT DETECTED NOT DETECTED   Enterobacter cloacae complex NOT DETECTED NOT DETECTED   Escherichia coli NOT DETECTED NOT DETECTED   Klebsiella aerogenes NOT DETECTED NOT DETECTED   Klebsiella oxytoca NOT DETECTED NOT DETECTED   Klebsiella pneumoniae NOT DETECTED NOT DETECTED   Proteus species NOT DETECTED NOT DETECTED   Salmonella species NOT DETECTED NOT DETECTED   Serratia marcescens NOT DETECTED NOT DETECTED   Haemophilus influenzae NOT DETECTED NOT DETECTED   Neisseria meningitidis NOT DETECTED NOT DETECTED   Pseudomonas aeruginosa NOT DETECTED NOT DETECTED   Stenotrophomonas maltophilia NOT DETECTED NOT DETECTED   Candida albicans NOT DETECTED NOT DETECTED   Candida auris NOT DETECTED NOT DETECTED   Candida glabrata NOT DETECTED NOT DETECTED   Candida krusei NOT DETECTED NOT DETECTED   Candida parapsilosis NOT DETECTED NOT DETECTED   Candida tropicalis NOT DETECTED NOT DETECTED   Cryptococcus neoformans/gattii NOT DETECTED NOT DETECTED   Methicillin resistance mecA/C DETECTED (A) NOT DETECTED    Comment: CRITICAL RESULT CALLED TO, READ BACK BY AND VERIFIED WITH: Lockie Mola PHARMD 1418 11/19/20 A BROWNING    Meth resistant mecA/C and MREJ NOT DETECTED NOT DETECTED    Comment: Performed at [facility], [address] [place], [place] [address]  Culture, blood (routine x 2)     Status: None (Preliminary result)   Collection Time: 11/18/20  7:11 PM   Specimen: BLOOD  Result Value Ref Range   Specimen Description BLOOD RIGHT ANTECUBITAL    Special Requests      BOTTLES DRAWN AEROBIC AND ANAEROBIC Blood Culture adequate volume   Culture      NO GROWTH 2 DAYS Performed at [facility], [place] [address], [place], [place] [address]    Report Status PENDING   I-Stat venous blood gas, ED     Status: Abnormal   Collection Time: 11/18/20  7:21 PM  Result Value Ref Range   pH, Ven 7.277 7.250 - 7.430   pCO2, Ven 71.6 (HH) 44.0 - 60.0 mmHg   pO2, Ven 39.0 32.0  - 45.0 mmHg   Bicarbonate 32.8 (H) 20.0 - 28.0 mmol/L   TCO2 35 (H) 22 - 32 mmol/L   O2 Saturation 59.0 %   Acid-Base Excess 4.0 (H) 0.0 - 2.0 mmol/L   Sodium 138 135 - 145 mmol/L   Potassium 4.2 3.5 - 5.1 mmol/L   Calcium, Ion 1.14 (L) 1.15 - 1.40 mmol/L   HCT 40.0 39.0 - 52.0 %   Hemoglobin 13.6 13.0 - 17.0 g/dL   Patient temperature 101.3 F    Sample type VENOUS    Comment NOTIFIED PHYSICIAN   I-Stat Chem 8, ED     Status: Abnormal   Collection Time: 11/18/20  7:22 PM  Result Value Ref Range   Sodium 138 135 - 145 mmol/L   Potassium 4.2 3.5 - 5.1 mmol/L   Chloride 97 (L) 98 - 111 mmol/L   BUN 16 8 - 23 mg/dL   Creatinine, Ser 2.20 (H) 0.61 - 1.24 mg/dL   Glucose, Bld 82 70 - 99 mg/dL    Comment: Glucose reference range applies only to samples taken after fasting for at least 8 hours.   Calcium, Ion 1.16 1.15 - 1.40 mmol/L  TCO2 31 22 - 32 mmol/L   Hemoglobin 13.6 13.0 - 17.0 g/dL   HCT 40.0 39.0 - 52.0 %  Brain natriuretic peptide     Status: Abnormal   Collection Time: 11/18/20  7:52 PM  Result Value Ref Range   B Natriuretic Peptide 1,079.0 (H) 0.0 - 100.0 pg/mL    Comment: Performed at [facility] [address] [place], [name] [address]  Troponin I (High Sensitivity)     Status: Abnormal   Collection Time: 11/18/20  7:52 PM  Result Value Ref Range   Troponin I (High Sensitivity) 178 (HH) <18 ng/L    Comment: CRITICAL RESULT CALLED TO, READ BACK BY AND VERIFIED WITH: Ricard Dillon [facility] 11/18/20 2202 WAYK Performed at [facility] [address] [place], [place] [address]   Lactic acid, plasma     Status: None   Collection Time: 11/18/20  8:20 PM  Result Value Ref Range   Lactic Acid, Venous 1.0 0.5 - 1.9 mmol/L    Comment: Performed at [facility] [address], [place], [place] [address]  Resp Panel by RT-PCR (Flu A&B, Covid) Nasopharyngeal Swab     Status: None   Collection Time: 11/18/20  9:25 PM   Specimen: Nasopharyngeal Swab;  Nasopharyngeal(NP) swabs in vial transport medium  Result Value Ref Range   SARS Coronavirus 2 by RT PCR NEGATIVE NEGATIVE    Comment: (NOTE) SARS-CoV-2 target nucleic acids are NOT DETECTED.  The SARS-CoV-2 RNA is generally detectable in upper respiratory specimens during the acute phase of infection. The lowest concentration of SARS-CoV-2 viral copies this assay can detect is 138 copies/mL. A negative result does not preclude SARS-Cov-2 infection and should not be used as the sole basis for treatment or other patient management decisions. A negative result may occur with  improper specimen collection/handling, submission of specimen other than nasopharyngeal swab, presence of viral mutation(s) within the areas targeted by this assay, and inadequate number of viral copies(<138 copies/mL). A negative result must be combined with clinical observations, patient history, and epidemiological information. The expected result is Negative.  Fact Sheet for Patients:  EntrepreneurPulse.com.au  Fact Sheet for Healthcare Providers:  IncredibleEmployment.be  This test is no t yet approved or cleared by the [place] FDA and  has been authorized for detection and/or diagnosis of SARS-CoV-2 by FDA under an Emergency Use Authorization (EUA). This EUA will remain  in effect (meaning this test can be used) for the duration of the COVID-19 declaration under Section 564(b)(1) of the Act, 21 U.S.C.section 360bbb-3(b)(1), unless the authorization is terminated  or revoked sooner.       Influenza A by PCR NEGATIVE NEGATIVE   Influenza B by PCR NEGATIVE NEGATIVE    Comment: (NOTE) The Xpert Xpress SARS-CoV-2/FLU/RSV plus assay is intended as an aid in the diagnosis of influenza from Nasopharyngeal swab specimens and should not be used as a sole basis for treatment. Nasal washings and aspirates are unacceptable for Xpert Xpress  SARS-CoV-2/FLU/RSV testing.  Fact Sheet for Patients: EntrepreneurPulse.com.au  Fact Sheet for Healthcare Providers: IncredibleEmployment.be  This test is not yet approved or cleared by the [place] FDA and has been authorized for detection and/or diagnosis of SARS-CoV-2 by FDA under an Emergency Use Authorization (EUA). This EUA will remain in effect (meaning this test can be used) for the duration of the COVID-19 declaration under Section 564(b)(1) of the Act, 21 U.S.C. section 360bbb-3(b)(1), unless the authorization is terminated or revoked.  Performed  at [facility], [place] [address] [place], [place] [address]   Urinalysis, Routine w reflex microscopic Urine, Clean Catch     Status: Abnormal   Collection Time: 11/18/20 11:45 PM  Result Value Ref Range   Color, Urine YELLOW YELLOW   APPearance HAZY (A) CLEAR   Specific Gravity, Urine 1.032 (H) 1.005 - 1.030   pH 5.0 5.0 - 8.0   Glucose, UA NEGATIVE NEGATIVE mg/dL   Hgb urine dipstick MODERATE (A) NEGATIVE   Bilirubin Urine NEGATIVE NEGATIVE   Ketones, ur NEGATIVE NEGATIVE mg/dL   Protein, ur >=300 (A) NEGATIVE mg/dL   Nitrite NEGATIVE NEGATIVE   Leukocytes,Ua NEGATIVE NEGATIVE   RBC / HPF >50 (H) 0 - 5 RBC/hpf   WBC, UA 6-10 0 - 5 WBC/hpf   Bacteria, UA FEW (A) NONE SEEN   Squamous Epithelial / LPF 0-5 0 - 5   Mucus PRESENT     Comment: Performed at [facility], [address], [place], [place] [address]  Lactic acid, plasma     Status: None   Collection Time: 11/18/20 11:50 PM  Result Value Ref Range   Lactic Acid, Venous 0.7 0.5 - 1.9 mmol/L    Comment: Performed at [facility] [address] [place], [place] [address]  Troponin I (High Sensitivity)     Status: Abnormal   Collection Time: 11/18/20 11:50 PM  Result Value Ref Range   Troponin I (High Sensitivity) 177 (HH) <18 ng/L    Comment: CRITICAL VALUE NOTED.  VALUE IS CONSISTENT WITH PREVIOUSLY  REPORTED AND CALLED VALUE. (NOTE) Elevated high sensitivity troponin I (hsTnI) values and significant  changes across serial measurements may suggest ACS but many other  chronic and acute conditions are known to elevate hsTnI results.  Refer to the Links section for chest pain algorithms and additional  guidance. Performed at [facility], [place] [address] [place], [place] [address]   Sedimentation rate     Status: Abnormal   Collection Time: 11/18/20 11:50 PM  Result Value Ref Range   Sed Rate 19 (H) 0 - 16 mm/hr    Comment: Performed at [facility] [address] [place], [place] [address]  Ammonia     Status: None   Collection Time: 11/18/20 11:50 PM  Result Value Ref Range   Ammonia 22 9 - 35 umol/L    Comment: Performed at [facility], [place] [address], [place], [place] [address]  Folate     Status: None   Collection Time: 11/18/20 11:50 PM  Result Value Ref Range   Folate 11.4 >5.9 ng/mL    Comment: Performed at [facility], [place] [address], [place], [place] [address]  Vitamin B12     Status: None   Collection Time: 11/18/20 11:50 PM  Result Value Ref Range   Vitamin B-12 374 180 - 914 pg/mL    Comment: (NOTE) This assay is not validated for testing neonatal or myeloproliferative syndrome specimens for Vitamin B12 levels. Performed at [facility], [place] [address], [place], [place] [address]   Rapid urine drug screen (hospital performed)     Status: Abnormal   Collection Time: 11/18/20 11:58 PM  Result Value Ref Range   Opiates POSITIVE (A) NONE DETECTED   Cocaine NONE DETECTED NONE DETECTED   Benzodiazepines NONE DETECTED NONE DETECTED   Amphetamines NONE DETECTED NONE DETECTED   Tetrahydrocannabinol NONE DETECTED NONE DETECTED   Barbiturates NONE  DETECTED NONE DETECTED    Comment: (NOTE) DRUG SCREEN FOR MEDICAL PURPOSES ONLY.  IF CONFIRMATION IS NEEDED FOR ANY PURPOSE, NOTIFY LAB WITHIN 5 DAYS.  LOWEST DETECTABLE LIMITS FOR  URINE DRUG SCREEN Drug Class                     Cutoff (ng/mL) Amphetamine and metabolites    1000 Barbiturate and metabolites    200 Benzodiazepine                 315 Tricyclics and metabolites     300 Opiates and metabolites        300 Cocaine and metabolites        300 THC                            50 Performed at [facility], [place] [address], [place], [name] [address]   HIV Antibody (routine testing w rflx)     Status: None   Collection Time: 11/19/20  2:33 AM  Result Value Ref Range   HIV Screen 4th Generation wRfx Non Reactive Non Reactive    Comment: Performed at [facility], [place] [address], [place], [place] [address]  CBC WITH DIFFERENTIAL     Status: Abnormal   Collection Time: 11/19/20  2:33 AM  Result Value Ref Range   WBC 7.0 4.0 - 10.5 K/uL   RBC 4.09 (L) 4.22 - 5.81 MIL/uL   Hemoglobin 12.4 (L) 13.0 - 17.0 g/dL   HCT 39.4 39.0 - 52.0 %   MCV 96.3 80.0 - 100.0 fL   MCH 30.3 26.0 - 34.0 pg   MCHC 31.5 30.0 - 36.0 g/dL   RDW 14.3 11.5 - 15.5 %   Platelets 208 150 - 400 K/uL   nRBC 0.0 0.0 - 0.2 %   Neutrophils Relative % 73 %   Neutro Abs 5.1 1.7 - 7.7 K/uL   Lymphocytes Relative 14 %   Lymphs Abs 1.0 0.7 - 4.0 K/uL   Monocytes Relative 9 %   Monocytes Absolute 0.7 0.1 - 1.0 K/uL   Eosinophils Relative 3 %   Eosinophils Absolute 0.2 0.0 - 0.5 K/uL   Basophils Relative 1 %   Basophils Absolute 0.1 0.0 - 0.1 K/uL   Immature Granulocytes 0 %   Abs Immature Granulocytes 0.02 0.00 - 0.07 K/uL    Comment: Performed at [facility], [address] [place], [place] [address]  Comprehensive metabolic panel     Status: Abnormal   Collection Time: 11/19/20  2:33 AM  Result Value Ref Range   Sodium 135 135 - 145 mmol/L   Potassium 4.2 3.5 - 5.1 mmol/L   Chloride 99 98 - 111 mmol/L   CO2 30 22 - 32 mmol/L   Glucose, Bld 111 (H) 70 - 99 mg/dL    Comment: Glucose reference range applies only to samples taken after fasting for at least 8 hours.    BUN 17 8 - 23 mg/dL   Creatinine, Ser 2.37 (H) 0.61 - 1.24 mg/dL   Calcium 8.4 (L) 8.9 - 10.3 mg/dL   Total Protein 5.7 (L) 6.5 - 8.1 g/dL   Albumin 3.2 (L) 3.5 - 5.0 g/dL   AST 64 (H) 15 - 41 U/L   ALT 73 (H) 0 - 44 U/L   Alkaline Phosphatase 103 38 - 126 U/L   Total Bilirubin 1.4 (H) 0.3 - 1.2  mg/dL   GFR, Estimated 30 (L) >60 mL/min    Comment: (NOTE) Calculated using the CKD-EPI Creatinine Equation (2021)    Anion gap 6 5 - 15    Comment: Performed at [facility], [place] [address], [place], [place] [address]  TSH     Status: None   Collection Time: 11/19/20  2:33 AM  Result Value Ref Range   TSH 1.247 0.350 - 4.500 uIU/mL    Comment: Performed by a 3rd Generation assay with a functional sensitivity of <=0.01 uIU/mL. Performed at [facility], [place] [address] [place], [name] [address]   Glucose, CSF     Status: Abnormal   Collection Time: 11/19/20  1:22 PM  Result Value Ref Range   Glucose, CSF 90 (H) 40 - 70 mg/dL    Comment: Performed at [facility] [address] [place], [place] [address]  Protein, CSF     Status: None   Collection Time: 11/19/20  1:22 PM  Result Value Ref Range   Total  Protein, CSF 27 15 - 45 mg/dL    Comment: Performed at [facility] [address], [place], [place] [address]  CSF cell count with differential     Status: Abnormal   Collection Time: 11/19/20  1:22 PM  Result Value Ref Range   Tube # 1    Color, CSF COLORLESS COLORLESS   Appearance, CSF CLEAR CLEAR   Supernatant NOT INDICATED    RBC Count, CSF 1 (H) 0 /cu mm   WBC, CSF 1 0 - 5 /cu mm   Other Cells, CSF TOO FEW TO COUNT, SMEAR AVAILABLE FOR REVIEW     Comment: RARE MONOCYTES AND LYMPHOCYTES Performed at [facility] [address] [place], [place] [address]   CSF culture w Gram Stain     Status: None (Preliminary result)   Collection Time: 11/19/20  1:22 PM   Specimen: CSF  Result Value Ref Range   Specimen Description CSF    Special  Requests NONE    Gram Stain      WBC PRESENT, PREDOMINANTLY MONONUCLEAR NO ORGANISMS SEEN CYTOSPIN SMEAR    Culture      NO GROWTH < 24 HOURS Performed at [facility], [place] [address], [place], [place] [address]    Report Status PENDING   Cryptococcal antigen, CSF     Status: None   Collection Time: 11/19/20  1:22 PM  Result Value Ref Range   Crypto Ag NEGATIVE NEGATIVE   Cryptococcal Ag Titer NOT INDICATED NOT INDICATED    Comment: Performed at [facility], [place] [address] [place], [place] [address]  Culture, blood (Routine X 2) w Reflex to ID Panel     Status: None (Preliminary result)   Collection Time: 11/19/20  6:18 PM   Specimen: BLOOD RIGHT ARM  Result Value Ref Range   Specimen Description BLOOD RIGHT ARM    Special Requests      BOTTLES DRAWN AEROBIC AND ANAEROBIC Blood Culture adequate volume   Culture      NO GROWTH < 12 HOURS Performed at [facility], [place] [address] [place], [place] [address]    Report Status PENDING   Culture, blood (Routine X 2) w Reflex to ID Panel     Status: None (Preliminary result)   Collection Time: 11/19/20  6:28 PM   Specimen: BLOOD RIGHT HAND  Result Value Ref Range   Specimen  Description BLOOD RIGHT HAND    Special Requests      BOTTLES DRAWN AEROBIC AND ANAEROBIC Blood Culture results may not be optimal due to an inadequate volume of blood received in culture bottles   Culture      NO GROWTH < 12 HOURS Performed at [facility] [address] [place], [place] [address]    Report Status PENDING   Hepatitis B surface antigen     Status: None   Collection Time: 11/19/20  6:45 PM  Result Value Ref Range   Hepatitis B Surface Ag NON REACTIVE NON REACTIVE    Comment: Performed at [facility] [address], [place], [place] [address]  Hepatitis B core antibody, total     Status: None   Collection Time: 11/19/20  6:45 PM  Result Value Ref Range   Hep B Core Total Ab NON REACTIVE NON REACTIVE     Comment: Performed at [facility] [address] [place], [place] [address]  Hepatitis B surface antibody,quantitative     Status: Abnormal   Collection Time: 11/19/20  6:45 PM  Result Value Ref Range   Hepatitis B-Post <3.1 (L) Immunity>9.9 mIU/mL    Comment: (NOTE)  Status of Immunity                     Anti-HBs Level  ------------------                     -------------- Inconsistent with Immunity                   0.0 - 9.9 Consistent with Immunity                          >9.9 Performed At: [facility] [place], [name] [id number] Rush Farmer MD [id number]   Hepatitis C antibody     Status: None   Collection Time: 11/19/20  6:45 PM  Result Value Ref Range   HCV Ab NON REACTIVE NON REACTIVE    Comment: (NOTE) Nonreactive HCV antibody screen is consistent with no HCV infections,  unless recent infection is suspected or other evidence exists to indicate HCV infection.  Performed at [facility], [place] [address], [place], [place] [address]   Comprehensive metabolic panel     Status: Abnormal   Collection Time: 11/20/20  7:06 AM  Result Value Ref Range   Sodium 142 135 - 145 mmol/L   Potassium 4.2 3.5 - 5.1 mmol/L   Chloride 103 98 - 111 mmol/L   CO2 31 22 - 32 mmol/L   Glucose, Bld 110 (H) 70 - 99 mg/dL    Comment: Glucose reference range applies only to samples taken after fasting for at least 8 hours.   BUN 14 8 - 23 mg/dL   Creatinine, Ser 2.02 (H) 0.61 - 1.24 mg/dL   Calcium 8.6 (L) 8.9 - 10.3 mg/dL   Total Protein 5.6 (L) 6.5 - 8.1 g/dL   Albumin 3.0 (L) 3.5 - 5.0 g/dL   AST 31 15 - 41 U/L   ALT 54 (H) 0 - 44 U/L   Alkaline Phosphatase 83 38 - 126 U/L   Total Bilirubin 1.1 0.3 - 1.2 mg/dL   GFR, Estimated 36 (L) >60 mL/min    Comment: (NOTE) Calculated using the CKD-EPI Creatinine Equation (2021)    Anion gap 8 5 - 15  Comment: Performed at [facility], [place] [address], [place], [place] [address]  CBC with  Differential/Platelet     Status: Abnormal   Collection Time: 11/20/20  7:06 AM  Result Value Ref Range   WBC 6.4 4.0 - 10.5 K/uL   RBC 3.91 (L) 4.22 - 5.81 MIL/uL   Hemoglobin 11.9 (L) 13.0 - 17.0 g/dL   HCT 35.5 (L) 39.0 - 52.0 %   MCV 90.8 80.0 - 100.0 fL   MCH 30.4 26.0 - 34.0 pg   MCHC 33.5 30.0 - 36.0 g/dL   RDW 14.1 11.5 - 15.5 %   Platelets 200 150 - 400 K/uL   nRBC 0.0 0.0 - 0.2 %   Neutrophils Relative % 74 %   Neutro Abs 4.8 1.7 - 7.7 K/uL   Lymphocytes Relative 12 %   Lymphs Abs 0.8 0.7 - 4.0 K/uL   Monocytes Relative 8 %   Monocytes Absolute 0.5 0.1 - 1.0 K/uL   Eosinophils Relative 5 %   Eosinophils Absolute 0.3 0.0 - 0.5 K/uL   Basophils Relative 1 %   Basophils Absolute 0.0 0.0 - 0.1 K/uL   Immature Granulocytes 0 %   Abs Immature Granulocytes 0.02 0.00 - 0.07 K/uL    Comment: Performed at [facility], [address], [place], [place] [address]   Manuel BRAIN WO CONTRAST  Result Date: 11/19/2020 CLINICAL DATA:  Altered mental status.  Fever EXAM: MRI HEAD WITHOUT CONTRAST TECHNIQUE: Multiplanar, multiecho pulse sequences of the brain and surrounding structures were obtained without intravenous contrast. COMPARISON:  Head CT and CTA from yesterday FINDINGS: Brain: No acute infarction, hemorrhage, hydrocephalus, extra-axial collection or mass lesion. Moderate for age FLAIR hyperintensity in the deep white matter attributed to chronic small vessel disease given medical history. Brain volume is normal. Choroid fissure cyst on the right, incidental. Remote micro hemorrhages in the right thalamus, usually hypertensive Vascular: Normal flow voids. Skull and upper cervical spine: No focal marrow lesion Sinuses/Orbits: The left superior ophthalmic vein was highlighted on prior. Intermittently these vessels appear distended which is usually from Valsalva. No asymmetry of these veins or the cavernous sinus region. IMPRESSION: 1. No emergent finding. 2. Moderate chronic small  vessel ischemia. Electronically Signed   By: Monte Fantasia M.D.   On: 11/19/2020 05:15   Manuel Venogram Head  Result Date: 11/20/2020 CLINICAL DATA:  Initial evaluation for possible dural venous sinus thrombosis. EXAM: Manuel VENOGRAM OF THE HEAD WITHOUT CONTRAST TECHNIQUE: Angiographic images of the intracranial venous structures were obtained using MRV technique without intravenous contrast. COMPARISON:  Prior MRI from earlier the same day. FINDINGS: Normal flow related signal seen throughout the superior sagittal sinus to the level of the torcula. Torcula itself appears patent. Transverse and sigmoid sinuses appear patent as do the visualized proximal internal jugular veins. Straight sinus, vein of Galen, internal cerebral veins, and basal veins of Rosenthal appear patent. No evidence for dural sinus thrombosis. No appreciable dural sinus stenosis. IMPRESSION: Normal intracranial MRV. No evidence for dural sinus thrombosis. Electronically Signed   By: Jeannine Boga M.D.   On: 11/20/2020 01:11   NM Pulmonary Perfusion  Result Date: 11/19/2020 CLINICAL DATA:  Shortness of breath EXAM: NUCLEAR MEDICINE PERFUSION LUNG SCAN TECHNIQUE: Perfusion images were obtained in multiple projections after intravenous injection of radiopharmaceutical. Views: Anterior, posterior, left lateral, right lateral, RPO, LPO, RAO, LAO. RADIOPHARMACEUTICALS:  4.1 mCi Tc-57m MAA IV COMPARISON:  Chest radiograph November 18, 2020. FINDINGS: Radiotracer uptake is homogeneous and  symmetric bilaterally. No perfusion defects evident. IMPRESSION: No perfusion defects evident. No findings indicative of pulmonary embolus. Electronically Signed   By: Lowella Grip III M.D.   On: 11/19/2020 13:04   DG Chest Port 1 View  Result Date: 11/18/2020 CLINICAL DATA:  Code stroke, questionable sepsis EXAM: PORTABLE CHEST 1 VIEW COMPARISON:  03/14/2019 FINDINGS: Single frontal view of the chest demonstrates marked enlargement the cardiac  silhouette. There is central vascular congestion with bibasilar airspace disease and small bilateral effusions. No pneumothorax. No acute bony abnormalities. IMPRESSION: 1. Moderate congestive heart failure. Electronically Signed   By: Randa Ngo M.D.   On: 11/18/2020 19:45   ECHOCARDIOGRAM COMPLETE  Result Date: 11/19/2020    ECHOCARDIOGRAM REPORT   Patient Name:   [name] Date of Exam: 11/19/2020 Medical Rec #:  [id number]       Height:       72.0 in Accession #:    [id number]      Weight:       238.1 lb Date of Birth:  19-Nov-1955       BSA:          2.294 m Patient Age:    6 years        BP:           138/86 mmHg Patient Gender: M               HR:           91 bpm. Exam Location:  Inpatient Procedure: 2D Echo, Cardiac Doppler, Color Doppler and 3D Echo Indications:    Aortic valve disorder I35.9                 Acutre respiratory distress R06.03                 Congestive Heart Failure I50.9  History:        Patient has prior history of Echocardiogram examinations, most                 recent 03/15/2019. Stroke, Previous echo read unicuspid aortic                 valve; Risk Factors:Hypertension. Sacoidosis. CKD III.  Sonographer:    Merrie Roof RDCS Referring Phys: [place]  1. The aortic valve is clearly tricuspid. Suspect prior study report was an error.  2. Left ventricular ejection fraction, by estimation, is 60 to 65%. The left ventricle has normal function. The left ventricle has no regional wall motion abnormalities. Left ventricular diastolic parameters are consistent with Grade I diastolic dysfunction (impaired relaxation).  3. Right ventricular systolic function is normal. The right ventricular size is normal. Tricuspid regurgitation signal is inadequate for assessing PA pressure.  4. Left atrial size was mildly dilated.  5. The mitral valve is grossly normal. Trivial mitral valve regurgitation. No evidence of mitral stenosis.  6. The aortic valve is tricuspid. Aortic  valve regurgitation is not visualized. No aortic stenosis is present.  7. The inferior vena cava is dilated in size with >50% respiratory variability, suggesting right atrial pressure of 8 mmHg. FINDINGS  Left Ventricle: Left ventricular ejection fraction, by estimation, is 60 to 65%. The left ventricle has normal function. The left ventricle has no regional wall motion abnormalities. The left ventricular internal cavity size was normal in size. There is  no left ventricular hypertrophy. Left ventricular diastolic parameters are consistent with Grade I diastolic dysfunction (impaired relaxation). Right  Ventricle: The right ventricular size is normal. No increase in right ventricular wall thickness. Right ventricular systolic function is normal. Tricuspid regurgitation signal is inadequate for assessing PA pressure. Left Atrium: Left atrial size was mildly dilated. Right Atrium: Right atrial size was normal in size. Pericardium: Trivial pericardial effusion is present. Mitral Valve: The mitral valve is grossly normal. Trivial mitral valve regurgitation. No evidence of mitral valve stenosis. Tricuspid Valve: The tricuspid valve is grossly normal. Tricuspid valve regurgitation is trivial. No evidence of tricuspid stenosis. Aortic Valve: The aortic valve is tricuspid. Aortic valve regurgitation is not visualized. No aortic stenosis is present. Aortic valve mean gradient measures 8.0 mmHg. Aortic valve peak gradient measures 17.6 mmHg. Aortic valve area, by VTI measures 2.72  cm. Pulmonic Valve: The pulmonic valve was grossly normal. Pulmonic valve regurgitation is not visualized. No evidence of pulmonic stenosis. Aorta: The aortic root is normal in size and structure. Venous: The inferior vena cava is dilated in size with greater than 50% respiratory variability, suggesting right atrial pressure of 8 mmHg. IAS/Shunts: The atrial septum is grossly normal.  LEFT VENTRICLE PLAX 2D LVIDd:         5.90 cm  Diastology LVIDs:          3.60 cm  LV e' medial:    4.57 cm/s LV PW:         1.00 cm  LV E/e' medial:  13.8 LV IVS:        0.80 cm  LV e' lateral:   9.14 cm/s LVOT diam:     2.10 cm  LV E/e' lateral: 6.9 LV SV:         101 LV SV Index:   44 LVOT Area:     3.46 cm  RIGHT VENTRICLE          IVC RV Basal diam:  5.10 cm  IVC diam: 2.70 cm RV Mid diam:    4.30 cm LEFT ATRIUM            Index       RIGHT ATRIUM           Index LA diam:      4.80 cm  2.09 cm/m  RA Area:     26.10 cm LA Vol (A2C): 102.0 ml 44.46 ml/m RA Volume:   92.60 ml  40.36 ml/m LA Vol (A4C): 120.0 ml 52.30 ml/m  AORTIC VALVE AV Area (Vmax):    2.80 cm AV Area (Vmean):   2.83 cm AV Area (VTI):     2.72 cm AV Vmax:           210.00 cm/s AV Vmean:          132.000 cm/s AV VTI:            0.370 m AV Peak Grad:      17.6 mmHg AV Mean Grad:      8.0 mmHg LVOT Vmax:         170.00 cm/s LVOT Vmean:        108.000 cm/s LVOT VTI:          0.291 m LVOT/AV VTI ratio: 0.79  AORTA Ao Root diam: 3.50 cm Ao Asc diam:  3.80 cm MITRAL VALVE MV Area (PHT): 3.42 cm     SHUNTS MV Decel Time: 222 msec     Systemic VTI:  0.29 m MV E velocity: 63.00 cm/s   Systemic Diam: 2.10 cm MV A velocity: 102.00 cm/s MV E/A ratio:  0.62 [place]  O'Neal MD Electronically signed by Eleonore Chiquito MD Signature Date/Time: 11/19/2020/5:43:12 PM    Final    CT HEAD CODE STROKE WO CONTRAST  Result Date: 11/18/2020 CLINICAL DATA:  Code stroke. Neuro deficit, acute, stroke suspected. Additional history provided: Last known well 0845. Expressive aphasia. EXAM: CT HEAD WITHOUT CONTRAST TECHNIQUE: Contiguous axial images were obtained from the base of the skull through the vertex without intravenous contrast. COMPARISON:  Brain MRI 03/14/2019. CT angiogram head/neck 03/14/2019. Noncontrast head CT 03/14/2019. FINDINGS: Brain: Cerebral volume is normal for age. Moderate patchy and ill-defined hypoattenuation within the cerebral white matter which is nonspecific, but compatible with chronic small vessel  ischemic disease. There is no acute intracranial hemorrhage. No demarcated cortical infarct is identified. No extra-axial fluid collection. No evidence of intracranial mass. No midline shift. Partially empty sella turcica. Vascular: No hyperdense vessel.  Atherosclerotic calcifications. Skull: Normal. Negative for fracture or focal lesion. Sinuses/Orbits: Visualized orbits show no acute finding. Trace scattered paranasal sinus mucosal thickening. ASPECTS ([place] Stroke Program Early CT Score) - Ganglionic level infarction (caudate, lentiform nuclei, internal capsule, insula, M1-M3 cortex): 7 - Supraganglionic infarction (M4-M6 cortex): 3 Total score (0-10 with 10 being normal): 10 These results were called by telephone at the time of interpretation on 11/18/2020 at 8:13 pm to provider [facility] , who verbally acknowledged these results. IMPRESSION: No evidence of acute infarct or acute intracranial hemorrhage. ASPECTS is 10. Redemonstrated moderate cerebral white matter chronic small vessel ischemic disease. Minimal scattered paranasal sinus mucosal thickening. Electronically Signed   By: Kellie Simmering DO   On: 11/18/2020 20:14   CT ANGIO HEAD CODE STROKE  Result Date: 11/18/2020 CLINICAL DATA:  Stroke/TIA, assess extracranial arteries. Stroke/TIA, assess intracranial arteries. EXAM: CT ANGIOGRAPHY HEAD AND NECK TECHNIQUE: Multidetector CT imaging of the head and neck was performed using the standard protocol during bolus administration of intravenous contrast. Multiplanar CT image reconstructions and MIPs were obtained to evaluate the vascular anatomy. Carotid stenosis measurements (when applicable) are obtained utilizing NASCET criteria, using the distal internal carotid diameter as the denominator. CONTRAST:  60 mL Omnipaque 350 intravenous contrast. COMPARISON:  CT angiogram head/neck 03/14/2019. FINDINGS: CTA NECK FINDINGS Aortic arch: Standard aortic branching. No hemodynamically significant  innominate or proximal subclavian artery stenosis. Right carotid system: CCA and ICA patent within the neck without stenosis. Tortuosity of the mid to distal cervical ICA. Minimal calcified plaque at the carotid bifurcations bilaterally. Left carotid system: CCA and ICA patent within the neck without stenosis. Tortuosity of the mid to distal cervical ICA. Vertebral arteries: Vertebral arteries codominant and patent within the neck without stenosis. Skeleton: Cervical spondylosis with multilevel disc space narrowing, disc bulges, uncovertebral hypertrophy and facet arthrosis. No acute bony abnormality or aggressive osseous lesion. Other neck: No neck mass or cervical lymphadenopathy. Thyroid unremarkable. Upper chest: Interlobular septal thickening within the imaged lung apices. Partially imaged layering bilateral pleural effusions. 7 mm right upper lobe pulmonary nodule (series 9, image 342). Redemonstrated calcified mediastinal lymph nodes. Review of the MIP images confirms the above findings CTA HEAD FINDINGS Anterior circulation: The intracranial internal carotid arteries are patent. Mild calcified plaque within both vessels without stenosis. The M1 middle cerebral arteries are patent. No M2 proximal branch occlusion or high-grade proximal stenosis is identified. The left A1 is dominant and the right A1 is diminutive. The left ACA remains dominant with an azygos type ACA appearance. No intracranial aneurysm is identified. Posterior circulation: The intracranial vertebral arteries are patent. The basilar artery is patent. The  posterior cerebral arteries are patent. A right posterior communicating artery is present. The left posterior communicating artery is hypoplastic or absent. Venous sinuses: Within the limitations of contrast timing, no convincing thrombus. Anatomic variants: As described. Other: There is asymmetric prominence and enhancement of the left superior ophthalmic vein. No associated asymmetry of the  cavernous sinuses is appreciated. Review of the MIP images confirms the above findings These results were called by telephone at the time of interpretation on 11/18/2020 at 8:40 pm to provider [facility] , who verbally acknowledged these results. IMPRESSION: CTA neck: 1. The bilateral common carotid, internal carotid and vertebral arteries are patent within the neck without stenosis. 2. Interlobular septal thickening within the imaged lung apices, nonspecific but possibly reflecting interstitial edema. Additionally, there are partially imaged layering bilateral pleural effusions. 3. 7 mm right upper lobe pulmonary nodule. Non-contrast chest CT at 6-12 months is recommended. If the nodule is stable at time of repeat CT, then future CT at 18-24 months (from today's scan) is considered optional for low-risk patients, but is recommended for high-risk patients. This recommendation follows the consensus statement: Guidelines for Management of Incidental Pulmonary Nodules Detected on CT Images: From the Fleischner Society 2017; Radiology 2017; 284:228-243. CTA head: 1. No intracranial large vessel occlusion or proximal high-grade arterial stenosis. 2. Asymmetric prominence and enhancement of the left superior ophthalmic vein, new from the prior CTA head/neck of 03/14/2019. No associated asymmetry of the cavernous sinuses is appreciated and this finding is indeterminate in etiology. Electronically Signed   By: Kellie Simmering DO   On: 11/18/2020 20:54   CT ANGIO NECK CODE STROKE  Result Date: 11/18/2020 CLINICAL DATA:  Stroke/TIA, assess extracranial arteries. Stroke/TIA, assess intracranial arteries. EXAM: CT ANGIOGRAPHY HEAD AND NECK TECHNIQUE: Multidetector CT imaging of the head and neck was performed using the standard protocol during bolus administration of intravenous contrast. Multiplanar CT image reconstructions and MIPs were obtained to evaluate the vascular anatomy. Carotid stenosis measurements (when  applicable) are obtained utilizing NASCET criteria, using the distal internal carotid diameter as the denominator. CONTRAST:  60 mL Omnipaque 350 intravenous contrast. COMPARISON:  CT angiogram head/neck 03/14/2019. FINDINGS: CTA NECK FINDINGS Aortic arch: Standard aortic branching. No hemodynamically significant innominate or proximal subclavian artery stenosis. Right carotid system: CCA and ICA patent within the neck without stenosis. Tortuosity of the mid to distal cervical ICA. Minimal calcified plaque at the carotid bifurcations bilaterally. Left carotid system: CCA and ICA patent within the neck without stenosis. Tortuosity of the mid to distal cervical ICA. Vertebral arteries: Vertebral arteries codominant and patent within the neck without stenosis. Skeleton: Cervical spondylosis with multilevel disc space narrowing, disc bulges, uncovertebral hypertrophy and facet arthrosis. No acute bony abnormality or aggressive osseous lesion. Other neck: No neck mass or cervical lymphadenopathy. Thyroid unremarkable. Upper chest: Interlobular septal thickening within the imaged lung apices. Partially imaged layering bilateral pleural effusions. 7 mm right upper lobe pulmonary nodule (series 9, image 342). Redemonstrated calcified mediastinal lymph nodes. Review of the MIP images confirms the above findings CTA HEAD FINDINGS Anterior circulation: The intracranial internal carotid arteries are patent. Mild calcified plaque within both vessels without stenosis. The M1 middle cerebral arteries are patent. No M2 proximal branch occlusion or high-grade proximal stenosis is identified. The left A1 is dominant and the right A1 is diminutive. The left ACA remains dominant with an azygos type ACA appearance. No intracranial aneurysm is identified. Posterior circulation: The intracranial vertebral arteries are patent. The basilar artery is patent. The posterior  cerebral arteries are patent. A right posterior communicating artery  is present. The left posterior communicating artery is hypoplastic or absent. Venous sinuses: Within the limitations of contrast timing, no convincing thrombus. Anatomic variants: As described. Other: There is asymmetric prominence and enhancement of the left superior ophthalmic vein. No associated asymmetry of the cavernous sinuses is appreciated. Review of the MIP images confirms the above findings These results were called by telephone at the time of interpretation on 11/18/2020 at 8:40 pm to provider [facility] , who verbally acknowledged these results. IMPRESSION: CTA neck: 1. The bilateral common carotid, internal carotid and vertebral arteries are patent within the neck without stenosis. 2. Interlobular septal thickening within the imaged lung apices, nonspecific but possibly reflecting interstitial edema. Additionally, there are partially imaged layering bilateral pleural effusions. 3. 7 mm right upper lobe pulmonary nodule. Non-contrast chest CT at 6-12 months is recommended. If the nodule is stable at time of repeat CT, then future CT at 18-24 months (from today's scan) is considered optional for low-risk patients, but is recommended for high-risk patients. This recommendation follows the consensus statement: Guidelines for Management of Incidental Pulmonary Nodules Detected on CT Images: From the Fleischner Society 2017; Radiology 2017; 284:228-243. CTA head: 1. No intracranial large vessel occlusion or proximal high-grade arterial stenosis. 2. Asymmetric prominence and enhancement of the left superior ophthalmic vein, new from the prior CTA head/neck of 03/14/2019. No associated asymmetry of the cavernous sinuses is appreciated and this finding is indeterminate in etiology. Electronically Signed   By: Kellie Simmering DO   On: 11/18/2020 20:54   DG FL GUIDED LUMBAR PUNCTURE  Result Date: 11/19/2020 CLINICAL DATA:  Acute encephalopathy EXAM: DIAGNOSTIC LUMBAR PUNCTURE UNDER FLUOROSCOPIC GUIDANCE  COMPARISON:  MRI head 11/19/2020.  MRI lumbar spine 12/18/2014 FLUOROSCOPY TIME:  Fluoroscopy Time:  0 minutes 6 seconds Radiation Exposure Index (if provided by the fluoroscopic device): Number of Acquired Spot Images: 1 PROCEDURE: Informed consent was obtained from the patient's wife prior to the procedure, including potential complications of headache, allergy, and pain. With the patient prone, the lower back was prepped with Betadine. 1% Lidocaine was used for local anesthesia. Lumbar puncture was performed at the L4-5 level using a 22 gauge needle with return of clear CSF with an opening pressure of 46.5 cm water. 11 ml of CSF were obtained for laboratory studies. The patient tolerated the procedure well and there were no apparent complications. IMPRESSION: Successful lumbar puncture. Elevated opening pressure 46.5 cm water. Clear CSF. Electronically Signed   By: Franchot Gallo M.D.   On: 11/19/2020 13:38                             Assessment and Plan: [name] is 65 yo with known sarcoidosis p/w encephalopathy and L upper facial weakness that we are asked to eval for papilledema with:   -- Normal ophthalmic exam with no evidence of disc edema and normal retinal veins/vasculature     All of the above information was relayed to the patient and his wife.  Ophthalmic warning signs and symptoms were reviewed, and clear instructions for immediate phone contact and/or immediate return to the ED or clinic were provided should any of these signs or symptoms occur.  Follow up with primary eye care provider Dr. Kathlen Mody @ [facility]  All questions were answered.   [name] Apo 11/20/2020, 12:31 PM  [facility] [phone]

## 2020-11-21 ENCOUNTER — Inpatient Hospital Stay (HOSPITAL_COMMUNITY): Payer: BC Managed Care – PPO

## 2020-11-21 ENCOUNTER — Inpatient Hospital Stay: Payer: Self-pay

## 2020-11-21 DIAGNOSIS — R7881 Bacteremia: Secondary | ICD-10-CM | POA: Diagnosis not present

## 2020-11-21 DIAGNOSIS — G4733 Obstructive sleep apnea (adult) (pediatric): Secondary | ICD-10-CM

## 2020-11-21 DIAGNOSIS — I10 Essential (primary) hypertension: Secondary | ICD-10-CM

## 2020-11-21 DIAGNOSIS — R609 Edema, unspecified: Secondary | ICD-10-CM

## 2020-11-21 DIAGNOSIS — Z720 Tobacco use: Secondary | ICD-10-CM

## 2020-11-21 DIAGNOSIS — D869 Sarcoidosis, unspecified: Secondary | ICD-10-CM | POA: Diagnosis not present

## 2020-11-21 DIAGNOSIS — N179 Acute kidney failure, unspecified: Secondary | ICD-10-CM | POA: Diagnosis not present

## 2020-11-21 DIAGNOSIS — J9601 Acute respiratory failure with hypoxia: Secondary | ICD-10-CM

## 2020-11-21 LAB — BASIC METABOLIC PANEL
Anion gap: 8 (ref 5–15)
BUN: 6 mg/dL — ABNORMAL LOW (ref 8–23)
CO2: 29 mmol/L (ref 22–32)
Calcium: 8.8 mg/dL — ABNORMAL LOW (ref 8.9–10.3)
Chloride: 103 mmol/L (ref 98–111)
Creatinine, Ser: 1.66 mg/dL — ABNORMAL HIGH (ref 0.61–1.24)
GFR, Estimated: 46 mL/min — ABNORMAL LOW (ref >60)
Glucose, Bld: 97 mg/dL (ref 70–99)
Potassium: 3.2 mmol/L — ABNORMAL LOW (ref 3.5–5.1)
Sodium: 140 mmol/L (ref 135–145)

## 2020-11-21 LAB — CBC
HCT: 36.2 % — ABNORMAL LOW (ref 39.0–52.0)
Hemoglobin: 12 g/dL — ABNORMAL LOW (ref 13.0–17.0)
MCH: 30.2 pg (ref 26.0–34.0)
MCHC: 33.1 g/dL (ref 30.0–36.0)
MCV: 91.2 fL (ref 80.0–100.0)
Platelets: 204 10*3/uL (ref 150–400)
RBC: 3.97 MIL/uL — ABNORMAL LOW (ref 4.22–5.81)
RDW: 14.1 % (ref 11.5–15.5)
WBC: 6.4 10*3/uL (ref 4.0–10.5)
nRBC: 0 % (ref 0.0–0.2)

## 2020-11-21 LAB — CULTURE, BLOOD (ROUTINE X 2): Special Requests: ADEQUATE

## 2020-11-21 LAB — OLIGOCLONAL BANDS, CSF + SERM

## 2020-11-21 MED ORDER — TRAMADOL HCL 50 MG PO TABS
50.0000 mg | ORAL_TABLET | Freq: Two times a day (BID) | ORAL | Status: DC | PRN
  Administered 2020-11-21 – 2020-11-22 (×2): 50 mg via ORAL
  Filled 2020-11-21 (×2): qty 1

## 2020-11-21 MED ORDER — CEFAZOLIN SODIUM-DEXTROSE 2-4 GM/100ML-% IV SOLN
2.0000 g | Freq: Three times a day (TID) | INTRAVENOUS | Status: DC
  Administered 2020-11-21 – 2020-11-22 (×4): 2 g via INTRAVENOUS
  Filled 2020-11-21 (×5): qty 100

## 2020-11-21 MED ORDER — FUROSEMIDE 10 MG/ML IJ SOLN
40.0000 mg | Freq: Once | INTRAMUSCULAR | Status: AC
  Administered 2020-11-21: 40 mg via INTRAVENOUS
  Filled 2020-11-21: qty 4

## 2020-11-21 MED ORDER — POTASSIUM CHLORIDE CRYS ER 20 MEQ PO TBCR
40.0000 meq | EXTENDED_RELEASE_TABLET | Freq: Once | ORAL | Status: AC
  Administered 2020-11-21: 40 meq via ORAL
  Filled 2020-11-21: qty 2

## 2020-11-21 NOTE — Plan of Care (Addendum)
Dr. Josephine Cables paged concerning pt still reporting pain in shoulder.  Patient has had Ultram (q12hrs prn) as well as tylenol 650mg  q6 hours recently with pain level decreasing from 8 to 7.  Pt reports that he had take Oxycodone post op and it helped with pain, pt is requesting something else for his pain management.   Awaiting response   0037 Dr. Josephine Cables paged again for pain medication

## 2020-11-21 NOTE — Progress Notes (Addendum)
PHARMACY CONSULT NOTE FOR:  OUTPATIENT  PARENTERAL ANTIBIOTIC THERAPY (OPAT)  Indication: Bacteremia Regimen: Ancef 2g IV q8h End date: 12/17/2020  IV antibiotic discharge orders are pended. To discharging provider:  please sign these orders via discharge navigator,  Select New Orders & click on the button choice - Manage This Unsigned Work.     Thank you for allowing pharmacy to be a part of this patient's care.  Claudina Lick, PharmD PGY1 Acute Care Pharmacy Resident 11/21/2020 10:42 AM  Please check [email or web address] for unit-specific pharmacy phone numbers.

## 2020-11-21 NOTE — Plan of Care (Signed)
Pt reports the pain is not being relieved with current pain medication.  MD notified.  Problem: Education: Goal: Knowledge of General Education information will improve Description: Including pain rating scale, medication(s)/side effects and non-pharmacologic comfort measures Outcome: Progressing   Problem: Health Behavior/Discharge Planning: Goal: Ability to manage health-related needs will improve Outcome: Progressing   Problem: Clinical Measurements: Goal: Ability to maintain clinical measurements within normal limits will improve Outcome: Progressing Goal: Will remain free from infection Outcome: Not Progressing Goal: Diagnostic test results will improve Outcome: Progressing Goal: Respiratory complications will improve Outcome: Progressing Goal: Cardiovascular complication will be avoided Outcome: Progressing   Problem: Activity: Goal: Risk for activity intolerance will decrease Outcome: Progressing   Problem: Nutrition: Goal: Adequate nutrition will be maintained Outcome: Progressing   Problem: Coping: Goal: Level of anxiety will decrease Outcome: Progressing   Problem: Elimination: Goal: Will not experience complications related to bowel motility Outcome: Progressing Goal: Will not experience complications related to urinary retention Outcome: Progressing

## 2020-11-21 NOTE — Progress Notes (Signed)
[facility] for Infectious Disease  Date of Admission:  11/18/2020           Reason for visit: Follow up on Staph aureus bacteremia  Current antibiotics: Vancomycin 3/15-present    Principal Problem:   Gram-positive bacteremia Active Problems:   Sarcoidosis   Essential hypertension   Tobacco abuse   OSA (obstructive sleep apnea)   Acute encephalopathy   ARF (acute renal failure) (HCC)   Acute respiratory failure (HCC)   Cranial neuropathy   ASSESSMENT:    1. Staph aureus bacteremia: admission blood cultures 11/18/20 in 2 of 4 bottles positive for MSSA and likely MRSE based on BCID.  Repeat cultures negative 11/19/20 and TTE without vegetation.  Low suspicion for endocarditis given rapid blood culture clearance and no other localizing symptoms.  I think we can forgo a TEE.  Some concern about sterility of cultures obtained through the ED however would not consider Staph aureus a contaminant.  Will transition to cefazolin per pharmacy to cover MSSA in the setting of this clinical uncertainty.  Will do four weeks of therapy in setting of community onset infection, patient with recent surgery, and chronic prednisone. 2. Staph epi positive blood culture: suspected contaminant 3. AKI on CKD: improved 4. Encephalopathy: improved 5. Hypoxic respiratory failure: CXR reviewed this AM showed improvement 6. Sarcoidosis: on chronic prednisone 14m daily 7. Recent shoulder debridement: No hardware placed and patient without any concerning findings for infection  PLAN:    . Transition to cefazolin per pharmacy dosed for current renal function . Primary team to titrate oxygen . See OPAT note   Diagnosis: MSSA bacteremia  Culture Result: as above  Allergies  Allergen Reactions  . Codeine Nausea Only  . Hydromorphone Other (See Comments)    hallucinations     OPAT Orders Discharge antibiotics to be given via PICC line Discharge antibiotics: Per pharmacy protocol  Cefazolin 2gm q8h  Duration: 4 weeks End Date: 12/17/20  [facility] Protocol:  Home health RN for IV administration and teaching; PICC line care and labs.    Labs weekly while on IV antibiotics: _x_ CBC with differential _x_ BMP __ CMP __ CRP __ ESR __ Vancomycin trough __ CK  _x_ Please pull PIC at completion of IV antibiotics __ Please leave PIC in place until doctor has seen patient or been notified  Fax weekly labs to ([phone] Clinic Follow Up Appt: 12/04/20 at 930am with Dr CMegan Salon  SUBJECTIVE:   24 hour events:  No acute events  No new complaints. Tolerating antibiotics.  No fevers, chills, headaches, shoulder pain, N/V/D.   OBJECTIVE:   Blood pressure (!) 152/98, pulse 60, temperature 98.7 F (37.1 C), temperature source Oral, resp. rate 18, height 6' (1.829 m), weight 108 kg, SpO2 94 %. Body mass index is 32.29 kg/m.  Physical Exam Constitutional:      General: He is not in acute distress.    Appearance: Normal appearance.  Pulmonary:     Effort: Pulmonary effort is normal. No respiratory distress.     Comments: On nasal cannula Neurological:     General: No focal deficit present.     Mental Status: He is alert and oriented to person, place, and time.  Psychiatric:        Mood and Affect: Mood normal.        Behavior: Behavior normal.      Lab Results: Lab Results  Component Value Date   WBC 6.4  11/21/2020   HGB 12.0 (L) 11/21/2020   HCT 36.2 (L) 11/21/2020   MCV 91.2 11/21/2020   PLT 204 11/21/2020    Lab Results  Component Value Date   NA 140 11/21/2020   K 3.2 (L) 11/21/2020   CO2 29 11/21/2020   GLUCOSE 97 11/21/2020   BUN 6 (L) 11/21/2020   CREATININE 1.66 (H) 11/21/2020   CALCIUM 8.8 (L) 11/21/2020   GFRNONAA 46 (L) 11/21/2020   GFRAA 58 (L) 03/14/2019    Lab Results  Component Value Date   ALT 54 (H) 11/20/2020   AST 31 11/20/2020   ALKPHOS 83 11/20/2020   BILITOT 1.1 11/20/2020    No results found for:  CRP     Component Value Date/Time   ESRSEDRATE 19 (H) 11/18/2020 2350     I have reviewed the micro and lab results in Epic.  Imaging: MR Venogram Head  Result Date: 11/20/2020 CLINICAL DATA:  Initial evaluation for possible dural venous sinus thrombosis. EXAM: MR VENOGRAM OF THE HEAD WITHOUT CONTRAST TECHNIQUE: Angiographic images of the intracranial venous structures were obtained using MRV technique without intravenous contrast. COMPARISON:  Prior MRI from earlier the same day. FINDINGS: Normal flow related signal seen throughout the superior sagittal sinus to the level of the torcula. Torcula itself appears patent. Transverse and sigmoid sinuses appear patent as do the visualized proximal internal jugular veins. Straight sinus, vein of Galen, internal cerebral veins, and basal veins of Rosenthal appear patent. No evidence for dural sinus thrombosis. No appreciable dural sinus stenosis. IMPRESSION: Normal intracranial MRV. No evidence for dural sinus thrombosis. Electronically Signed   By: Jeannine Boga M.D.   On: 11/20/2020 01:11   NM Pulmonary Perfusion  Result Date: 11/19/2020 CLINICAL DATA:  Shortness of breath EXAM: NUCLEAR MEDICINE PERFUSION LUNG SCAN TECHNIQUE: Perfusion images were obtained in multiple projections after intravenous injection of radiopharmaceutical. Views: Anterior, posterior, left lateral, right lateral, RPO, LPO, RAO, LAO. RADIOPHARMACEUTICALS:  4.1 mCi Tc-71mMAA IV COMPARISON:  Chest radiograph November 18, 2020. FINDINGS: Radiotracer uptake is homogeneous and symmetric bilaterally. No perfusion defects evident. IMPRESSION: No perfusion defects evident. No findings indicative of pulmonary embolus. Electronically Signed   By: WLowella GripIII M.D.   On: 11/19/2020 13:04   DG CHEST PORT 1 VIEW  Result Date: 11/21/2020 CLINICAL DATA:  65year old male respiratory failure.  Sarcoidosis. EXAM: PORTABLE CHEST 1 VIEW COMPARISON:  Portable chest 11/18/2020 and  earlier. FINDINGS: Portable AP upright view at 0607 hours. Improved lung volumes from 11/18/2020. Bilateral veiling opacity compatible with small pleural effusions. Cardiac and mediastinal contours remain within normal limits. Visualized tracheal air column is within normal limits. No pneumothorax or pulmonary edema. No definite consolidation. Paucity of bowel gas in the upper abdomen. No acute osseous abnormality identified. IMPRESSION: Improved lung volumes with small bilateral pleural effusions suspected. No overt edema. Electronically Signed   By: HGenevie AnnM.D.   On: 11/21/2020 08:34   ECHOCARDIOGRAM COMPLETE  Result Date: 11/19/2020    ECHOCARDIOGRAM REPORT   Patient Name:   KDONNEL VENUTODate of Exam: 11/19/2020 Medical Rec #:  [id number]      Height:       72.0 in Accession #:    [id number]     Weight:       238.1 lb Date of Birth:  [birth year]      BSA:          2.294 m Patient Age:    89years  BP:           138/86 mmHg Patient Gender: M               HR:           91 bpm. Exam Location:  Inpatient Procedure: 2D Echo, Cardiac Doppler, Color Doppler and 3D Echo Indications:    Aortic valve disorder I35.9                 Acutre respiratory distress R06.03                 Congestive Heart Failure I50.9  History:        Patient has prior history of Echocardiogram examinations, most                 recent 03/15/2019. Stroke, Previous echo read unicuspid aortic                 valve; Risk Factors:Hypertension. Sacoidosis. CKD III.  Sonographer:    Merrie Roof RDCS Referring Phys: [place]  1. The aortic valve is clearly tricuspid. Suspect prior study report was an error.  2. Left ventricular ejection fraction, by estimation, is 60 to 65%. The left ventricle has normal function. The left ventricle has no regional wall motion abnormalities. Left ventricular diastolic parameters are consistent with Grade I diastolic dysfunction (impaired relaxation).  3. Right ventricular systolic  function is normal. The right ventricular size is normal. Tricuspid regurgitation signal is inadequate for assessing PA pressure.  4. Left atrial size was mildly dilated.  5. The mitral valve is grossly normal. Trivial mitral valve regurgitation. No evidence of mitral stenosis.  6. The aortic valve is tricuspid. Aortic valve regurgitation is not visualized. No aortic stenosis is present.  7. The inferior vena cava is dilated in size with >50% respiratory variability, suggesting right atrial pressure of 8 mmHg. FINDINGS  Left Ventricle: Left ventricular ejection fraction, by estimation, is 60 to 65%. The left ventricle has normal function. The left ventricle has no regional wall motion abnormalities. The left ventricular internal cavity size was normal in size. There is  no left ventricular hypertrophy. Left ventricular diastolic parameters are consistent with Grade I diastolic dysfunction (impaired relaxation). Right Ventricle: The right ventricular size is normal. No increase in right ventricular wall thickness. Right ventricular systolic function is normal. Tricuspid regurgitation signal is inadequate for assessing PA pressure. Left Atrium: Left atrial size was mildly dilated. Right Atrium: Right atrial size was normal in size. Pericardium: Trivial pericardial effusion is present. Mitral Valve: The mitral valve is grossly normal. Trivial mitral valve regurgitation. No evidence of mitral valve stenosis. Tricuspid Valve: The tricuspid valve is grossly normal. Tricuspid valve regurgitation is trivial. No evidence of tricuspid stenosis. Aortic Valve: The aortic valve is tricuspid. Aortic valve regurgitation is not visualized. No aortic stenosis is present. Aortic valve mean gradient measures 8.0 mmHg. Aortic valve peak gradient measures 17.6 mmHg. Aortic valve area, by VTI measures 2.72  cm. Pulmonic Valve: The pulmonic valve was grossly normal. Pulmonic valve regurgitation is not visualized. No evidence of pulmonic  stenosis. Aorta: The aortic root is normal in size and structure. Venous: The inferior vena cava is dilated in size with greater than 50% respiratory variability, suggesting right atrial pressure of 8 mmHg. IAS/Shunts: The atrial septum is grossly normal.  LEFT VENTRICLE PLAX 2D LVIDd:         5.90 cm  Diastology LVIDs:  3.60 cm  LV e' medial:    4.57 cm/s LV PW:         1.00 cm  LV E/e' medial:  13.8 LV IVS:        0.80 cm  LV e' lateral:   9.14 cm/s LVOT diam:     2.10 cm  LV E/e' lateral: 6.9 LV SV:         101 LV SV Index:   44 LVOT Area:     3.46 cm  RIGHT VENTRICLE          IVC RV Basal diam:  5.10 cm  IVC diam: 2.70 cm RV Mid diam:    4.30 cm LEFT ATRIUM            Index       RIGHT ATRIUM           Index LA diam:      4.80 cm  2.09 cm/m  RA Area:     26.10 cm LA Vol (A2C): 102.0 ml 44.46 ml/m RA Volume:   92.60 ml  40.36 ml/m LA Vol (A4C): 120.0 ml 52.30 ml/m  AORTIC VALVE AV Area (Vmax):    2.80 cm AV Area (Vmean):   2.83 cm AV Area (VTI):     2.72 cm AV Vmax:           210.00 cm/s AV Vmean:          132.000 cm/s AV VTI:            0.370 m AV Peak Grad:      17.6 mmHg AV Mean Grad:      8.0 mmHg LVOT Vmax:         170.00 cm/s LVOT Vmean:        108.000 cm/s LVOT VTI:          0.291 m LVOT/AV VTI ratio: 0.79  AORTA Ao Root diam: 3.50 cm Ao Asc diam:  3.80 cm MITRAL VALVE MV Area (PHT): 3.42 cm     SHUNTS MV Decel Time: 222 msec     Systemic VTI:  0.29 m MV E velocity: 63.00 cm/s   Systemic Diam: 2.10 cm MV A velocity: 102.00 cm/s MV E/A ratio:  0.62 Eleonore Chiquito MD Electronically signed by Eleonore Chiquito MD Signature Date/Time: 11/19/2020/5:43:12 PM    Final    [place] EKG SITE RITE  Result Date: 11/21/2020 If Site Rite image not attached, placement could not be confirmed due to current cardiac rhythm.  DG FL GUIDED LUMBAR PUNCTURE  Result Date: 11/19/2020 CLINICAL DATA:  Acute encephalopathy EXAM: DIAGNOSTIC LUMBAR PUNCTURE UNDER FLUOROSCOPIC GUIDANCE COMPARISON:  MRI head 11/19/2020.   MRI lumbar spine 12/18/2014 FLUOROSCOPY TIME:  Fluoroscopy Time:  0 minutes 6 seconds Radiation Exposure Index (if provided by the fluoroscopic device): Number of Acquired Spot Images: 1 PROCEDURE: Informed consent was obtained from the patient's wife prior to the procedure, including potential complications of headache, allergy, and pain. With the patient prone, the lower back was prepped with Betadine. 1% Lidocaine was used for local anesthesia. Lumbar puncture was performed at the L4-5 level using a 22 gauge needle with return of clear CSF with an opening pressure of 46.5 cm water. 11 ml of CSF were obtained for laboratory studies. The patient tolerated the procedure well and there were no apparent complications. IMPRESSION: Successful lumbar puncture. Elevated opening pressure 46.5 cm water. Clear CSF. Electronically Signed   By: Franchot Gallo M.D.   On: 11/19/2020 13:38  Imaging  independently reviewed in Epic.    [facility] [place] [phone] pager 11/21/2020, 10:52 AM  I spent greater than 35 minutes with the patient including greater than 50% of time in face to face counsel of the patient and in coordination of their care.

## 2020-11-21 NOTE — Progress Notes (Signed)
Lower extremity venous has been completed.   Preliminary results in CV Proc.   [name] 11/21/2020 2:31 PM

## 2020-11-21 NOTE — Progress Notes (Signed)
PROGRESS NOTE  [name]  ZOX:[id number] DOB: June 04, 1956 DOA: 11/18/2020 PCP: [name], MD   Brief Narrative: [name] is a 65 y.o. male with a history of sarcoidosis on chronic prednisone 34m, stage IIIa CKD, OSA not on CPAP, HTN, and rotator cuff tear s/p repair 2 weeks prior who presented to the ED 3/15 with confusion. In the ED he was mildly hypoxic with CXR demonstrating vascular congestion. Pt was febrile to 101.76F with WBC of 8k. SCr was 2.3 (from baseline ~1.4). Neurology was consulted due to encephalopathy, recommending empiric Tx for meningitis/encephalitis pending fluoro-guided LP and MRI brain which was unremarkable. Blood cultures have grown GPC in 1 of 4. V/Q scan was negative and empiric heparin discontinued. LP not consistent with meningitis.   Assessment & Plan: Principal Problem:   Gram-positive bacteremia Active Problems:   Sarcoidosis   Essential hypertension   Tobacco abuse   OSA (obstructive sleep apnea)   Acute encephalopathy   ARF (acute renal failure) (HCC)   Acute respiratory failure (HCC)   Cranial neuropathy  Acute metabolic encephalopathy: LP not consistent with meningitis. Opening pressure elevated though this may be spurious. No papilledema on ophthalmology evaluation and crypto Ag negative. Improvement prior to antiviral not consistent with encephalitis. Possibly metabolic with renal impairment. Folate, B12 wnl. ESR 19. TSH 1.247. Hypercarbia and hypoxia likely contributing as well. MRV negative.  - Delirium precautions, avoid sedating medications.  - Treat fever if recurrent.  Fever, bacteremia: Blood culture in 2 of 4 growing GPC w/rapid results indicating both S. epi and S. aureus with +methicillin resistance.  - ID consult appreciated, on vancomycin monotherapy pending further culture data. Repeat cultures NGTD thus far. May be planning daptomycin 1,0070mIV q24h with end-date 4/13 per pharmacy notes. Would need PICC placement.   Acute  hypoxic respiratory failure: V/Q scan negative.  - Remains hypoxic likely from combination of OSA and possibly some pulmonary edema. Will trial lasix 4067mV x1, monitor I/O, and attempt to liberate from oxygen over the next 24 hours. - Echocardiogram showed LVEF 60-65%, G1DD, tricuspid AV without vegetations. IVC dilated. BNP elevated, troponin elevated most likely due to demand ischemia as trend is flat and pt denies chest pain and no STEMI on ECG.  - Will check LE U/S to eval for DVT with swelling.   - IS as atelectasis is very likely.  - Continue supplemental oxygen as needed to maintain SpO2 90-95%. Consider NIPPV if worsening as pt has OSA not on CPAP.   AKI on stage IIIa CKD: Suspected to be due to dehydration initially. Much improved.   Sarcoidosis:  - Continue chronic steroids. Given a dose of stress steroids.  HTN:  - Diltiazem, hydralazine, labetalol  HLD:  - Holding statin with LFT elevation.   Mild LFT elevations:  - Unclear etiology, improving. Recheck in AM  Hematuria:  - Repeat UA at follow up.  Obesity: Estimated body mass index is 32.29 kg/m as calculated from the following:   Height as of this encounter: 6' (1.829 m).   Weight as of this encounter: 108 kg.  DVT prophylaxis: Lovenox Code Status: Full Family Communication: Wife by phone Disposition Plan:  Status is: Inpatient  Remains inpatient appropriate because:Altered mental status and Ongoing diagnostic testing needed not appropriate for outpatient work up   Dispo: The patient is from: Home              Anticipated d/c is to: Home likely 3/19 if able to liberate from oxygen.  Patient currently is not medically stable to d/c.   Difficult to place patient No  Consultants:   Neurology  Ophthalmology  Radiology  Infectious disease  Procedures:   LP  Antimicrobials:  Vancomycin, cefepime, ampicillin, acyclovir   Subjective: More alert this morning sitting on side of bed,  but still had reported disorientation this morning. No agitation. Denies chest pain or dyspnea, though still on oxygen even at rest.    Objective: Vitals:   11/20/20 1800 11/20/20 2015 11/21/20 0000 11/21/20 0400  BP: (!) 147/95 (!) 157/89 (!) 169/96 (!) 152/98  Pulse: 61 61 64 60  Resp: (!) _0 Temp: 98.6 F (37 C) 98.3 F (36.8 C) 98.8 F (37.1 C) 98.7 F (37.1 C)  TempSrc: Oral Oral Oral Oral  SpO2: 93% 96% 93% 94%  Weight:      Height:        Intake/Output Summary (Last 24 hours) at 11/21/2020 1043 Last data filed at 11/21/2020 0400 Gross per 24 hour  Intake -  Output 600 ml  Net -600 ml   Filed Weights   11/18/20 1852  Weight: 108 kg   Gen: 65 y.o. male in no distress Pulm: Nonlabored breathing supplemental oxygen at rest, crackles at bases. CV: Regular rate and rhythm. No murmur, rub, or gallop. NoUTD JVD, Stable dependent edema. GI: Abdomen soft, non-tender, non-distended, with normoactive bowel sounds.  Ext: Warm, no deformities Skin: No new rashes, lesions or ulcers on visualized skin. Neuro: Alert and oriented x3. Unclear what brought him to ED initially. No new focal neurological deficits. Psych: Judgement and insight appear fair. Mood euthymic & affect congruent. Behavior is appropriate.    Data Reviewed: I have personally reviewed following labs and imaging studies  CBC: Recent Labs  Lab 11/18/20 1858 11/18/20 1921 11/18/20 1922 11/19/20 0233 11/20/20 0706 11/21/20 0302  WBC 8.0  --   --  7.0 6.4 6.4  NEUTROABS 5.8  --   --  5.1 4.8  --   HGB 13.0 13.6 13.6 12.4* 11.9* 12.0*  HCT 39.4 40.0 40.0 39.4 35.5* 36.2*  MCV 93.8  --   --  96.3 90.8 91.2  PLT 244  --   --  208 200 300   Basic Metabolic Panel: Recent Labs  Lab 11/18/20 1858 11/18/20 1921 11/18/20 1922 11/19/20 0233 11/20/20 0706 11/21/20 0302  NA 137 138 138 135 142 140  K 4.2 4.2 4.2 4.2 4.2 3.2*  CL 98  --  97* 99 103 103  CO2 31  --   --  _1 GLUCOSE 84  --  82  111* 110* 97  BUN 14  --  _2 6*  CREATININE 2.31*  --  2.20* 2.37* 2.02* 1.66*  CALCIUM 8.8*  --   --  8.4* 8.6* 8.8*   GFR: Estimated Creatinine Clearance: 57.9 mL/min (A) (by C-G formula based on SCr of 1.66 mg/dL (H)). Liver Function Tests: Recent Labs  Lab 11/18/20 1858 11/19/20 0233 11/20/20 0706  AST 63* 64* 31  ALT 74* 73* 54*  ALKPHOS 105 103 83  BILITOT 1.1 1.4* 1.1  PROT 6.3* 5.7* 5.6*  ALBUMIN 3.4* 3.2* 3.0*   No results for input(s): LIPASE, AMYLASE in the last 168 hours. Recent Labs  Lab 11/18/20 2350  AMMONIA 22   Coagulation Profile: Recent Labs  Lab 11/18/20 1858  INR 1.2   Cardiac Enzymes: No results for input(s): CKTOTAL, CKMB, CKMBINDEX, TROPONINI in the last 168 hours.  BNP (last 3 results) No results for input(s): PROBNP in the last 8760 hours. HbA1C: No results for input(s): HGBA1C in the last 72 hours. CBG: No results for input(s): GLUCAP in the last 168 hours. Lipid Profile: No results for input(s): CHOL, HDL, LDLCALC, TRIG, CHOLHDL, LDLDIRECT in the last 72 hours. Thyroid Function Tests: Recent Labs    11/19/20 0233  TSH 1.247   Anemia Panel: Recent Labs    11/18/20 2350  VITAMINB12 374  FOLATE 11.4   Urine analysis:    Component Value Date/Time   COLORURINE YELLOW 11/18/2020 2345   APPEARANCEUR HAZY (A) 11/18/2020 2345   LABSPEC 1.032 (H) 11/18/2020 2345   PHURINE 5.0 11/18/2020 2345   GLUCOSEU NEGATIVE 11/18/2020 2345   HGBUR MODERATE (A) 11/18/2020 2345   BILIRUBINUR NEGATIVE 11/18/2020 2345   KETONESUR NEGATIVE 11/18/2020 2345   PROTEINUR >=300 (A) 11/18/2020 2345   UROBILINOGEN 0.2 11/16/2012 1004   NITRITE NEGATIVE 11/18/2020 2345   LEUKOCYTESUR NEGATIVE 11/18/2020 2345   Recent Results (from the past 240 hour(s))  Urine culture     Status: Abnormal   Collection Time: 11/18/20  6:58 PM   Specimen: In/Out Cath Urine  Result Value Ref Range Status   Specimen Description IN/OUT CATH URINE  Final   Special  Requests   Final    NONE Performed at [facility], [place] [address] [place], [place] [address]    Culture MULTIPLE SPECIES PRESENT, SUGGEST RECOLLECTION (A)  Final   Report Status 11/20/2020 FINAL  Final  Culture, blood (routine x 2)     Status: Abnormal   Collection Time: 11/18/20  6:59 PM   Specimen: BLOOD  Result Value Ref Range Status   Specimen Description BLOOD LEFT ANTECUBITAL  Final   Special Requests   Final    BOTTLES DRAWN AEROBIC AND ANAEROBIC Blood Culture adequate volume   Culture  Setup Time   Final    GRAM POSITIVE COCCI IN CLUSTERS IN BOTH AEROBIC AND ANAEROBIC BOTTLES Organism ID to follow CRITICAL RESULT CALLED TO, READ BACK BY AND VERIFIED WITH: T JACKY PHARMD 1418 11/19/20 A BROWNING    Culture (A)  Final    STAPHYLOCOCCUS AUREUS STAPHYLOCOCCUS EPIDERMIDIS THE SIGNIFICANCE OF ISOLATING THIS ORGANISM FROM A SINGLE SET OF BLOOD CULTURES WHEN MULTIPLE SETS ARE DRAWN IS UNCERTAIN. PLEASE NOTIFY THE MICROBIOLOGY DEPARTMENT WITHIN ONE WEEK IF SPECIATION AND SENSITIVITIES ARE REQUIRED. Performed at [facility], [place] [address] [place], [place] [address]    Report Status 11/21/2020 FINAL  Final   Organism ID, Bacteria STAPHYLOCOCCUS AUREUS  Final      Susceptibility   Staphylococcus aureus - MIC*    CIPROFLOXACIN <=0.5 SENSITIVE Sensitive     ERYTHROMYCIN >=8 RESISTANT Resistant     GENTAMICIN <=0.5 SENSITIVE Sensitive     OXACILLIN 0.5 SENSITIVE Sensitive     TETRACYCLINE <=1 SENSITIVE Sensitive     VANCOMYCIN 1 SENSITIVE Sensitive     TRIMETH/SULFA <=10 SENSITIVE Sensitive     CLINDAMYCIN RESISTANT Resistant     RIFAMPIN <=0.5 SENSITIVE Sensitive     Inducible Clindamycin POSITIVE Resistant     * STAPHYLOCOCCUS AUREUS  Blood Culture ID Panel (Reflexed)     Status: Abnormal   Collection Time: 11/18/20  6:59 PM  Result Value Ref Range Status   Enterococcus faecalis NOT DETECTED NOT DETECTED Final   Enterococcus Faecium NOT DETECTED NOT DETECTED  Final   Listeria monocytogenes NOT DETECTED NOT DETECTED Final   Staphylococcus species DETECTED (A) NOT  DETECTED Final    Comment: CRITICAL RESULT CALLED TO, READ BACK BY AND VERIFIED WITH: T JACKY PHARMD 1418 11/19/20 A BROWNING    Staphylococcus aureus (BCID) DETECTED (A) NOT DETECTED Final    Comment: CRITICAL RESULT CALLED TO, READ BACK BY AND VERIFIED WITH: T JACKY PHARMD 1418 11/19/20 A BROWNING    Staphylococcus epidermidis DETECTED (A) NOT DETECTED Final    Comment: CRITICAL RESULT CALLED TO, READ BACK BY AND VERIFIED WITH: Lockie Mola PHARMD 1418 11/19/20 A BROWNING    Staphylococcus lugdunensis NOT DETECTED NOT DETECTED Final   Streptococcus species NOT DETECTED NOT DETECTED Final   Streptococcus agalactiae NOT DETECTED NOT DETECTED Final   Streptococcus pneumoniae NOT DETECTED NOT DETECTED Final   Streptococcus pyogenes NOT DETECTED NOT DETECTED Final   A.calcoaceticus-baumannii NOT DETECTED NOT DETECTED Final   Bacteroides fragilis NOT DETECTED NOT DETECTED Final   Enterobacterales NOT DETECTED NOT DETECTED Final   Enterobacter cloacae complex NOT DETECTED NOT DETECTED Final   Escherichia coli NOT DETECTED NOT DETECTED Final   Klebsiella aerogenes NOT DETECTED NOT DETECTED Final   Klebsiella oxytoca NOT DETECTED NOT DETECTED Final   Klebsiella pneumoniae NOT DETECTED NOT DETECTED Final   Proteus species NOT DETECTED NOT DETECTED Final   Salmonella species NOT DETECTED NOT DETECTED Final   Serratia marcescens NOT DETECTED NOT DETECTED Final   Haemophilus influenzae NOT DETECTED NOT DETECTED Final   Neisseria meningitidis NOT DETECTED NOT DETECTED Final   Pseudomonas aeruginosa NOT DETECTED NOT DETECTED Final   Stenotrophomonas maltophilia NOT DETECTED NOT DETECTED Final   Candida albicans NOT DETECTED NOT DETECTED Final   Candida auris NOT DETECTED NOT DETECTED Final   Candida glabrata NOT DETECTED NOT DETECTED Final   Candida krusei NOT DETECTED NOT DETECTED Final    Candida parapsilosis NOT DETECTED NOT DETECTED Final   Candida tropicalis NOT DETECTED NOT DETECTED Final   Cryptococcus neoformans/gattii NOT DETECTED NOT DETECTED Final   Methicillin resistance mecA/C DETECTED (A) NOT DETECTED Final    Comment: CRITICAL RESULT CALLED TO, READ BACK BY AND VERIFIED WITH: T JACKY PHARMD 1418 11/19/20 A BROWNING    Meth resistant mecA/C and MREJ NOT DETECTED NOT DETECTED Final    Comment: Performed at [facility], [address] [place], [place] [address]  Culture, blood (routine x 2)     Status: None (Preliminary result)   Collection Time: 11/18/20  7:11 PM   Specimen: BLOOD  Result Value Ref Range Status   Specimen Description BLOOD RIGHT ANTECUBITAL  Final   Special Requests   Final    BOTTLES DRAWN AEROBIC AND ANAEROBIC Blood Culture adequate volume   Culture   Final    NO GROWTH 3 DAYS Performed at [facility], [place] [address] [place], [place] [address]    Report Status PENDING  Incomplete  Resp Panel by RT-PCR (Flu A&B, Covid) Nasopharyngeal Swab     Status: None   Collection Time: 11/18/20  9:25 PM   Specimen: Nasopharyngeal Swab; Nasopharyngeal(NP) swabs in vial transport medium  Result Value Ref Range Status   SARS Coronavirus 2 by RT PCR NEGATIVE NEGATIVE Final    Comment: (NOTE) SARS-CoV-2 target nucleic acids are NOT DETECTED.  The SARS-CoV-2 RNA is generally detectable in upper respiratory specimens during the acute phase of infection. The lowest concentration of SARS-CoV-2 viral copies this assay can detect is 138 copies/mL. A negative result does not preclude SARS-Cov-2 infection and should not be used as the sole basis for treatment or  other patient management decisions. A negative result may occur with  improper specimen collection/handling, submission of specimen other than nasopharyngeal swab, presence of viral mutation(s) within the areas targeted by this assay, and inadequate number of viral copies(<138  copies/mL). A negative result must be combined with clinical observations, patient history, and epidemiological information. The expected result is Negative.  Fact Sheet for Patients:  EntrepreneurPulse.com.au  Fact Sheet for Healthcare Providers:  IncredibleEmployment.be  This test is no t yet approved or cleared by the [place] FDA and  has been authorized for detection and/or diagnosis of SARS-CoV-2 by FDA under an Emergency Use Authorization (EUA). This EUA will remain  in effect (meaning this test can be used) for the duration of the COVID-19 declaration under Section 564(b)(1) of the Act, 21 U.S.C.section 360bbb-3(b)(1), unless the authorization is terminated  or revoked sooner.       Influenza A by PCR NEGATIVE NEGATIVE Final   Influenza B by PCR NEGATIVE NEGATIVE Final    Comment: (NOTE) The Xpert Xpress SARS-CoV-2/FLU/RSV plus assay is intended as an aid in the diagnosis of influenza from Nasopharyngeal swab specimens and should not be used as a sole basis for treatment. Nasal washings and aspirates are unacceptable for Xpert Xpress SARS-CoV-2/FLU/RSV testing.  Fact Sheet for Patients: EntrepreneurPulse.com.au  Fact Sheet for Healthcare Providers: IncredibleEmployment.be  This test is not yet approved or cleared by the [place] FDA and has been authorized for detection and/or diagnosis of SARS-CoV-2 by FDA under an Emergency Use Authorization (EUA). This EUA will remain in effect (meaning this test can be used) for the duration of the COVID-19 declaration under Section 564(b)(1) of the Act, 21 U.S.C. section 360bbb-3(b)(1), unless the authorization is terminated or revoked.  Performed at [facility], [place] [address], [place], [place] [address]   CSF culture w Gram Stain     Status: None (Preliminary result)   Collection Time: 11/19/20  1:22 PM   Specimen: CSF  Result Value  Ref Range Status   Specimen Description CSF  Final   Special Requests NONE  Final   Gram Stain   Final    WBC PRESENT, PREDOMINANTLY MONONUCLEAR NO ORGANISMS SEEN CYTOSPIN SMEAR    Culture   Final    NO GROWTH 2 DAYS Performed at [facility], [place] [address] [place], [place] [address]    Report Status PENDING  Incomplete  Culture, blood (Routine X 2) w Reflex to ID Panel     Status: None (Preliminary result)   Collection Time: 11/19/20  6:18 PM   Specimen: BLOOD RIGHT ARM  Result Value Ref Range Status   Specimen Description BLOOD RIGHT ARM  Final   Special Requests   Final    BOTTLES DRAWN AEROBIC AND ANAEROBIC Blood Culture adequate volume   Culture   Final    NO GROWTH 2 DAYS Performed at [facility], [place] [address] [place], [place] [address]    Report Status PENDING  Incomplete  Culture, blood (Routine X 2) w Reflex to ID Panel     Status: None (Preliminary result)   Collection Time: 11/19/20  6:28 PM   Specimen: BLOOD RIGHT HAND  Result Value Ref Range Status   Specimen Description BLOOD RIGHT HAND  Final   Special Requests   Final    BOTTLES DRAWN AEROBIC AND ANAEROBIC Blood Culture results may not be optimal due to an inadequate volume of blood received in culture bottles   Culture   Final  NO GROWTH 2 DAYS Performed at [facility], [place] [address] [place], [place] [address]    Report Status PENDING  Incomplete      Radiology Studies: MR Venogram Head  Result Date: 11/20/2020 CLINICAL DATA:  Initial evaluation for possible dural venous sinus thrombosis. EXAM: MR VENOGRAM OF THE HEAD WITHOUT CONTRAST TECHNIQUE: Angiographic images of the intracranial venous structures were obtained using MRV technique without intravenous contrast. COMPARISON:  Prior MRI from earlier the same day. FINDINGS: Normal flow related signal seen throughout the superior sagittal sinus to the level of the torcula. Torcula itself appears patent. Transverse and sigmoid  sinuses appear patent as do the visualized proximal internal jugular veins. Straight sinus, vein of Galen, internal cerebral veins, and basal veins of Rosenthal appear patent. No evidence for dural sinus thrombosis. No appreciable dural sinus stenosis. IMPRESSION: Normal intracranial MRV. No evidence for dural sinus thrombosis. Electronically Signed   By: Jeannine Boga M.D.   On: 11/20/2020 01:11   NM Pulmonary Perfusion  Result Date: 11/19/2020 CLINICAL DATA:  Shortness of breath EXAM: NUCLEAR MEDICINE PERFUSION LUNG SCAN TECHNIQUE: Perfusion images were obtained in multiple projections after intravenous injection of radiopharmaceutical. Views: Anterior, posterior, left lateral, right lateral, RPO, LPO, RAO, LAO. RADIOPHARMACEUTICALS:  4.1 mCi Tc-68mMAA IV COMPARISON:  Chest radiograph November 18, 2020. FINDINGS: Radiotracer uptake is homogeneous and symmetric bilaterally. No perfusion defects evident. IMPRESSION: No perfusion defects evident. No findings indicative of pulmonary embolus. Electronically Signed   By: WLowella GripIII M.D.   On: 11/19/2020 13:04   DG CHEST PORT 1 VIEW  Result Date: 11/21/2020 CLINICAL DATA:  65year old male respiratory failure.  Sarcoidosis. EXAM: PORTABLE CHEST 1 VIEW COMPARISON:  Portable chest 11/18/2020 and earlier. FINDINGS: Portable AP upright view at 0607 hours. Improved lung volumes from 11/18/2020. Bilateral veiling opacity compatible with small pleural effusions. Cardiac and mediastinal contours remain within normal limits. Visualized tracheal air column is within normal limits. No pneumothorax or pulmonary edema. No definite consolidation. Paucity of bowel gas in the upper abdomen. No acute osseous abnormality identified. IMPRESSION: Improved lung volumes with small bilateral pleural effusions suspected. No overt edema. Electronically Signed   By: HGenevie AnnM.D.   On: 11/21/2020 08:34   ECHOCARDIOGRAM COMPLETE  Result Date: 11/19/2020    ECHOCARDIOGRAM  REPORT   Patient Name:   KJAYTHEN HAMMEDate of Exam: 11/19/2020 Medical Rec #:  [id number]      Height:       72.0 in Accession #:    [id number]     Weight:       238.1 lb Date of Birth:  [birth year]      BSA:          2.294 m Patient Age:    63years        BP:           138/86 mmHg Patient Gender: M               HR:           91 bpm. Exam Location:  Inpatient Procedure: 2D Echo, Cardiac Doppler, Color Doppler and 3D Echo Indications:    Aortic valve disorder I35.9                 Acutre respiratory distress R06.03                 Congestive Heart Failure I50.9  History:  Patient has prior history of Echocardiogram examinations, most                 recent 03/15/2019. Stroke, Previous echo read unicuspid aortic                 valve; Risk Factors:Hypertension. Sacoidosis. CKD III.  Sonographer:    Merrie Roof RDCS Referring Phys: [place]  1. The aortic valve is clearly tricuspid. Suspect prior study report was an error.  2. Left ventricular ejection fraction, by estimation, is 60 to 65%. The left ventricle has normal function. The left ventricle has no regional wall motion abnormalities. Left ventricular diastolic parameters are consistent with Grade I diastolic dysfunction (impaired relaxation).  3. Right ventricular systolic function is normal. The right ventricular size is normal. Tricuspid regurgitation signal is inadequate for assessing PA pressure.  4. Left atrial size was mildly dilated.  5. The mitral valve is grossly normal. Trivial mitral valve regurgitation. No evidence of mitral stenosis.  6. The aortic valve is tricuspid. Aortic valve regurgitation is not visualized. No aortic stenosis is present.  7. The inferior vena cava is dilated in size with >50% respiratory variability, suggesting right atrial pressure of 8 mmHg. FINDINGS  Left Ventricle: Left ventricular ejection fraction, by estimation, is 60 to 65%. The left ventricle has normal function. The left ventricle has no  regional wall motion abnormalities. The left ventricular internal cavity size was normal in size. There is  no left ventricular hypertrophy. Left ventricular diastolic parameters are consistent with Grade I diastolic dysfunction (impaired relaxation). Right Ventricle: The right ventricular size is normal. No increase in right ventricular wall thickness. Right ventricular systolic function is normal. Tricuspid regurgitation signal is inadequate for assessing PA pressure. Left Atrium: Left atrial size was mildly dilated. Right Atrium: Right atrial size was normal in size. Pericardium: Trivial pericardial effusion is present. Mitral Valve: The mitral valve is grossly normal. Trivial mitral valve regurgitation. No evidence of mitral valve stenosis. Tricuspid Valve: The tricuspid valve is grossly normal. Tricuspid valve regurgitation is trivial. No evidence of tricuspid stenosis. Aortic Valve: The aortic valve is tricuspid. Aortic valve regurgitation is not visualized. No aortic stenosis is present. Aortic valve mean gradient measures 8.0 mmHg. Aortic valve peak gradient measures 17.6 mmHg. Aortic valve area, by VTI measures 2.72  cm. Pulmonic Valve: The pulmonic valve was grossly normal. Pulmonic valve regurgitation is not visualized. No evidence of pulmonic stenosis. Aorta: The aortic root is normal in size and structure. Venous: The inferior vena cava is dilated in size with greater than 50% respiratory variability, suggesting right atrial pressure of 8 mmHg. IAS/Shunts: The atrial septum is grossly normal.  LEFT VENTRICLE PLAX 2D LVIDd:         5.90 cm  Diastology LVIDs:         3.60 cm  LV e' medial:    4.57 cm/s LV PW:         1.00 cm  LV E/e' medial:  13.8 LV IVS:        0.80 cm  LV e' lateral:   9.14 cm/s LVOT diam:     2.10 cm  LV E/e' lateral: 6.9 LV SV:         101 LV SV Index:   44 LVOT Area:     3.46 cm  RIGHT VENTRICLE          IVC RV Basal diam:  5.10 cm  IVC diam: 2.70 cm RV Mid diam:  4.30 cm LEFT  ATRIUM            Index       RIGHT ATRIUM           Index LA diam:      4.80 cm  2.09 cm/m  RA Area:     26.10 cm LA Vol (A2C): 102.0 ml 44.46 ml/m RA Volume:   92.60 ml  40.36 ml/m LA Vol (A4C): 120.0 ml 52.30 ml/m  AORTIC VALVE AV Area (Vmax):    2.80 cm AV Area (Vmean):   2.83 cm AV Area (VTI):     2.72 cm AV Vmax:           210.00 cm/s AV Vmean:          132.000 cm/s AV VTI:            0.370 m AV Peak Grad:      17.6 mmHg AV Mean Grad:      8.0 mmHg LVOT Vmax:         170.00 cm/s LVOT Vmean:        108.000 cm/s LVOT VTI:          0.291 m LVOT/AV VTI ratio: 0.79  AORTA Ao Root diam: 3.50 cm Ao Asc diam:  3.80 cm MITRAL VALVE MV Area (PHT): 3.42 cm     SHUNTS MV Decel Time: 222 msec     Systemic VTI:  0.29 m MV E velocity: 63.00 cm/s   Systemic Diam: 2.10 cm MV A velocity: 102.00 cm/s MV E/A ratio:  0.62 Eleonore Chiquito MD Electronically signed by Eleonore Chiquito MD Signature Date/Time: 11/19/2020/5:43:12 PM    Final    DG FL GUIDED LUMBAR PUNCTURE  Result Date: 11/19/2020 CLINICAL DATA:  Acute encephalopathy EXAM: DIAGNOSTIC LUMBAR PUNCTURE UNDER FLUOROSCOPIC GUIDANCE COMPARISON:  MRI head 11/19/2020.  MRI lumbar spine 12/18/2014 FLUOROSCOPY TIME:  Fluoroscopy Time:  0 minutes 6 seconds Radiation Exposure Index (if provided by the fluoroscopic device): Number of Acquired Spot Images: 1 PROCEDURE: Informed consent was obtained from the patient's wife prior to the procedure, including potential complications of headache, allergy, and pain. With the patient prone, the lower back was prepped with Betadine. 1% Lidocaine was used for local anesthesia. Lumbar puncture was performed at the L4-5 level using a 22 gauge needle with return of clear CSF with an opening pressure of 46.5 cm water. 11 ml of CSF were obtained for laboratory studies. The patient tolerated the procedure well and there were no apparent complications. IMPRESSION: Successful lumbar puncture. Elevated opening pressure 46.5 cm water. Clear CSF.  Electronically Signed   By: Franchot Gallo M.D.   On: 11/19/2020 13:38    Scheduled Meds: . atorvastatin  20 mg Oral Daily  . diltiazem  360 mg Oral Daily  . enoxaparin (LOVENOX) injection  40 mg Subcutaneous Q24H  . furosemide  40 mg Intravenous Once  . hydrALAZINE  50 mg Oral BID  . labetalol  300 mg Oral Q8H  . LORazepam  0.5 mg Intravenous Once  . phenylephrine  1 drop Both Eyes Once  . potassium chloride  40 mEq Oral Once  . predniSONE  5 mg Oral Q breakfast  . tamsulosin  0.4 mg Oral Daily  . thiamine injection  100 mg Intravenous Daily   Or  . thiamine  100 mg Oral Daily  . tropicamide  1 drop Both Eyes Once   Continuous Infusions: . vancomycin 750 mg (11/20/20 2352)     LOS: 1  day   Time spent: 35 minutes.  Patrecia Pour, MD [facility] [email or web address] 11/21/2020, 10:43 AM

## 2020-11-22 LAB — COMPREHENSIVE METABOLIC PANEL
ALT: 36 U/L (ref 0–44)
AST: 23 U/L (ref 15–41)
Albumin: 3.2 g/dL — ABNORMAL LOW (ref 3.5–5.0)
Alkaline Phosphatase: 81 U/L (ref 38–126)
Anion gap: 7 (ref 5–15)
BUN: 8 mg/dL (ref 8–23)
CO2: 29 mmol/L (ref 22–32)
Calcium: 8.6 mg/dL — ABNORMAL LOW (ref 8.9–10.3)
Chloride: 102 mmol/L (ref 98–111)
Creatinine, Ser: 1.53 mg/dL — ABNORMAL HIGH (ref 0.61–1.24)
GFR, Estimated: 51 mL/min — ABNORMAL LOW (ref >60)
Glucose, Bld: 87 mg/dL (ref 70–99)
Potassium: 3.1 mmol/L — ABNORMAL LOW (ref 3.5–5.1)
Sodium: 138 mmol/L (ref 135–145)
Total Bilirubin: 1 mg/dL (ref 0.3–1.2)
Total Protein: 6 g/dL — ABNORMAL LOW (ref 6.5–8.1)

## 2020-11-22 LAB — HSV 1/2 PCR, CSF
HSV-1 DNA: NEGATIVE
HSV-2 DNA: NEGATIVE

## 2020-11-22 LAB — CSF CULTURE W GRAM STAIN: Culture: NO GROWTH

## 2020-11-22 LAB — MAGNESIUM: Magnesium: 1.9 mg/dL (ref 1.7–2.4)

## 2020-11-22 MED ORDER — CEFAZOLIN IV (FOR PTA / DISCHARGE USE ONLY): 2.0000 g | Freq: Three times a day (TID) | INTRAVENOUS | 0 refills | Status: AC

## 2020-11-22 MED ORDER — HEPARIN SOD (PORK) LOCK FLUSH 100 UNIT/ML IV SOLN
250.0000 [IU] | INTRAVENOUS | Status: AC | PRN
  Administered 2020-11-22: 250 [IU]
  Filled 2020-11-22: qty 2.5

## 2020-11-22 MED ORDER — CHLORHEXIDINE GLUCONATE CLOTH 2 % EX PADS
6.0000 | MEDICATED_PAD | Freq: Every day | CUTANEOUS | Status: DC
  Administered 2020-11-22: 6 via TOPICAL

## 2020-11-22 MED ORDER — SODIUM CHLORIDE 0.9% FLUSH
10.0000 mL | INTRAVENOUS | Status: DC | PRN
Start: 2020-11-22 — End: 2020-11-22

## 2020-11-22 MED ORDER — TRAMADOL HCL 50 MG PO TABS
50.0000 mg | ORAL_TABLET | Freq: Once | ORAL | Status: AC
  Administered 2020-11-22: 50 mg via ORAL
  Filled 2020-11-22: qty 1

## 2020-11-22 MED ORDER — POTASSIUM CHLORIDE CRYS ER 20 MEQ PO TBCR
40.0000 meq | EXTENDED_RELEASE_TABLET | Freq: Once | ORAL | Status: AC
  Administered 2020-11-22: 40 meq via ORAL
  Filled 2020-11-22: qty 2

## 2020-11-22 NOTE — Progress Notes (Signed)
Peripherally Inserted Central Catheter Placement  The IV Nurse has discussed with the patient and/or persons authorized to consent for the patient, the purpose of this procedure and the potential benefits and risks involved with this procedure.  The benefits include less needle sticks, lab draws from the catheter, and the patient may be discharged home with the catheter. Risks include, but not limited to, infection, bleeding, blood clot (thrombus formation), and puncture of an artery; nerve damage and irregular heartbeat and possibility to perform a PICC exchange if needed/ordered by physician.  Alternatives to this procedure were also discussed.  Bard Power PICC patient education guide, fact sheet on infection prevention and patient information card has been provided to patient /or left at bedside.    PICC Placement Documentation  PICC Single Lumen 24/17/53 PICC Right Basilic 45 cm 0 cm (Active)  Indication for Insertion or Continuance of Line Home intravenous therapies (PICC only) 11/22/20 0958  Exposed Catheter (cm) 0 cm 11/22/20 0958  Site Assessment Clean;Dry;Intact 11/22/20 0958  Line Status Flushed;Saline locked;Blood return noted 11/22/20 0958  Dressing Type Transparent;Securing device 11/22/20 0104  Dressing Status Clean;Dry;Intact 11/22/20 0958  Antimicrobial disc in place? Yes 11/22/20 0958  Safety Lock Not Applicable 04/59/13 6859  Dressing Change Due 11/29/20 11/22/20 0958       Frances Maywood 11/22/2020, 10:11 AM

## 2020-11-22 NOTE — Plan of Care (Signed)
  Problem: Education: Goal: Knowledge of General Education information will improve Description: Including pain rating scale, medication(s)/side effects and non-pharmacologic comfort measures Outcome: Adequate for Discharge   Problem: Health Behavior/Discharge Planning: Goal: Ability to manage health-related needs will improve Outcome: Adequate for Discharge   Problem: Clinical Measurements: Goal: Ability to maintain clinical measurements within normal limits will improve Outcome: Adequate for Discharge Goal: Will remain free from infection Outcome: Adequate for Discharge Goal: Diagnostic test results will improve Outcome: Adequate for Discharge Goal: Respiratory complications will improve Outcome: Adequate for Discharge Goal: Cardiovascular complication will be avoided Outcome: Adequate for Discharge   Problem: Activity: Goal: Risk for activity intolerance will decrease Outcome: Adequate for Discharge   Problem: Nutrition: Goal: Adequate nutrition will be maintained Outcome: Adequate for Discharge   Problem: Coping: Goal: Level of anxiety will decrease Outcome: Adequate for Discharge   Problem: Elimination: Goal: Will not experience complications related to bowel motility Outcome: Adequate for Discharge Goal: Will not experience complications related to urinary retention Outcome: Adequate for Discharge   Problem: Pain Managment: Goal: General experience of comfort will improve Outcome: Adequate for Discharge   Problem: Safety: Goal: Ability to remain free from injury will improve Outcome: Adequate for Discharge   Problem: Skin Integrity: Goal: Risk for impaired skin integrity will decrease Outcome: Adequate for Discharge   Problem: Acute Rehab OT Goals (only OT should resolve) Goal: Pt. Will Perform Grooming Outcome: Adequate for Discharge Goal: Pt. Will Perform Upper Body Dressing Outcome: Adequate for Discharge Goal: Pt. Will Perform Lower Body  Dressing Outcome: Adequate for Discharge Goal: Pt. Will Perform Tub/Shower Transfer Outcome: Adequate for Discharge

## 2020-11-22 NOTE — Discharge Summary (Signed)
Physician Discharge Summary  AREG BIALAS YBF:[id number] DOB: 11-21-1955 DOA: 11/18/2020  PCP: [name], MD  Admit date: 11/18/2020 Discharge date: 11/22/2020  Admitted From: Home Disposition: Home   Recommendations for Outpatient Follow-up:  1. Follow up with PCP in 1-2 weeks 2. Follow up with neurology (referral placed by neurohospitalist). With multiple bouts of confusion, may consider neuropsychiatric evaluation. 3. Follow up with [facility] as scheduled, will continue ancef thru PICC x4 weeks, end date 12/17/2020 for MSSA bacteremia. 4. Check BMP, CBC, urinalysis at PCP follow up  Home Health: RN Equipment/Devices: PICC placed 11/22/2020 Discharge Condition: Stable CODE STATUS: Full Diet recommendation: Heart healthy  Brief/Interim Summary: Nayel Purdy Bynumis O7 y.o.malewith a history of sarcoidosis on chronic prednisone 48m, stage IIIa CKD, OSA not on CPAP, HTN, and rotator cuff tear s/p repair 2 weeks prior who presented to the ED 3/15 with confusion. In the ED he was mildly hypoxic with CXR demonstrating vascular congestion. Pt was febrile to 101.3FwithWBC of 8k. SCr was 2.3 (from baseline ~1.4). Neurology was consulted due to encephalopathy, recommending empiric Tx for meningitis/encephalitis pending fluoro-guided LP and MRI brain which was unremarkable. Blood cultures have grown GPC in 1 of 4 with S. epi (suspected contaminant) and MSSA which must be treated as true infection. V/Q scan was negative and empiric heparin discontinued. LP not consistent with meningitis. Mental status normalized with antibiotic therapy and the patient remains stable for discharge.   Discharge Diagnoses:  Principal Problem:   Gram-positive bacteremia Active Problems:   Sarcoidosis   Essential hypertension   Tobacco abuse   OSA (obstructive sleep apnea)   Acute encephalopathy   ARF (acute renal failure) (HCC)   Acute respiratory failure (HCC)   Cranial neuropathy  MSSA bacteremia: admission  blood cultures 11/18/20 in 2 of 4 bottles positive for MSSA and likely MRSE based on BCID.  Repeat cultures negative 11/19/20 and TTE without vegetation.  Low suspicion for endocarditis given rapid blood culture clearance and no other localizing symptoms.  I think we can forgo a TEE.  Some concern about sterility of cultures obtained through the ED however would not consider Staph aureus a contaminant.  Will transition to cefazolin per pharmacy to cover MSSA in the setting of this clinical uncertainty.  Will do four weeks of therapy in setting of community onset infection, patient with recent surgery, and chronic prednisone. - Cefazolin 2gm q8h Duration: 4 weeks End Date: 12/17/20 Labs weekly while on IV antibiotics: _x_ CBC with differential _x_ BMP _x_ Please pull PIC at completion of IV antibiotics Fax weekly labs to ([phone] Up Appt: 12/04/20 at 930am with Dr CMegan Salon Staph epi positive blood culture: suspected contaminant  Acute metabolic encephalopathy: LP not consistent with meningitis. Opening pressure elevated though this may be spurious. No papilledema on ophthalmology evaluation and crypto Ag negative. Improvement prior to antiviral not consistent with encephalitis. Possibly metabolic with renal impairment.Folate, B12 wnl. ESR 19. TSH 1.247.Hypercarbia and hypoxia likely contributing as well. MRV negative.  - Outpatient neurology follow up, mentation normalized.   Acute hypoxic respiratory failure: V/Q scan negative. Hypoxia improved with improving mentation, leading to suspect hypoventilation/OSA, also contribution from IV fluids with hypoxia resolved after lasix x1. LE U/S negative for DVT. - Follow up with PCP  - IS as atelectasis is very likely.   AKI on stage IIIa CKD: Suspected to be due to dehydration initially. Much improved.   Sarcoidosis:  - Continue chronic steroids. Given a dose of stress steroids.  HTN:  -  Diltiazem, hydralazine, labetalol  HLD:   -Restart statin  Mild LFT elevations:  - Improved  Hematuria:  - Repeat UA at follow up.  Obesity: Estimated body mass index is 32.29 kg/m as calculated from the following:   Height as of this encounter: 6' (1.829 m).   Weight as of this encounter: 108 kg.  Discharge Instructions Discharge Instructions    [facility] pharmacist to adjust dose for Vancomycin, Aminoglycosides and other anti-infective therapies as requested by physician.   Complete by: As directed    [facility] pharmacist to adjust dose for Vancomycin, Aminoglycosides and other anti-infective therapies as requested by physician.   Complete by: As directed    [facility] to provide Cath Flo 751m   Complete by: As directed    Administer for PICC line occlusion and as ordered by physician for other access device issues.   [facility] to provide Cath Flo 230m  Complete by: As directed    Administer for PICC line occlusion and as ordered by physician for other access device issues.   Ambulatory referral to Neurology   Complete by: As directed    An appointment is requested in approximately: 2-3 wks post discharge   Anaphylaxis Kit: Provided to treat any anaphylactic reaction to the medication being provided to the patient if First Dose or when requested by physician   Complete by: As directed    Epinephrine 60m660ml vial / amp: Administer 0.51mg56m.51ml)56mbcutaneously once for moderate to severe anaphylaxis, nurse to call physician and pharmacy when reaction occurs and call 911 if needed for immediate care   Diphenhydramine 50mg/160mV vial: Administer 25-50mg I460m PRN for first dose reaction, rash, itching, mild reaction, nurse to call physician and pharmacy when reaction occurs   Sodium Chloride 0.9% NS 500ml IV360mminister if needed for hypovolemic blood pressure drop or as ordered by physician after call to physician with anaphylactic reaction   Anaphylaxis Kit: Provided to  treat any anaphylactic reaction to the medication being provided to the patient if First Dose or when requested by physician   Complete by: As directed    Epinephrine 60mg/ml v66m / amp: Administer 0.51mg (0.51m28msubcu1051meously once for moderate to severe anaphylaxis, nurse to call physician and pharmacy when reaction occurs and call 911 if needed for immediate care   Diphenhydramine 50mg/ml IV 98m: Administer 25-50mg IV/IM P25mor first dose reaction, rash, itching, mild reaction, nurse to call physician and pharmacy when reaction occurs   Sodium Chloride 0.9% NS 500ml IV: Admi39mer if needed for hypovolemic blood pressure drop or as ordered by physician after call to physician with anaphylactic reaction   Change dressing on IV access line weekly and PRN   Complete by: As directed    Change dressing on IV access line weekly and PRN   Complete by: As directed    Diet - low sodium heart healthy   Complete by: As directed    Discharge instructions   Complete by: As directed    You were evaluated for confusion and fever found to have a bloodstream infection called MSSA bacteremia. Your mental status has improved and vital signs have stabilized. You will need to continue an IV antibiotic called ANCEF for 4 total weeks, but you can be discharged home and continue that at home administered through a PICC line. A home health nurse and agency will help you with this. A follow up appointment with the infectious disease specialist has been scheduled on  3/31. A referral to neurology has also been placed. You have no evidence of meningitis or stroke at this time. There is also no evidence of a blood clot in your legs.   For a time you had an oxygen requirement which is thought to have been due to hypoventilation primarily and has resolved since your mental status has become more alert. You should follow up with your doctor for continued evaluation.  Please follow up with your primary doctor in the next 1-2  weeks or seek medical attention right away if your mental status changes, you develop fever, head or neck pain or stiffness, fainting, chest pain, shortness of breath, or other new worrisome symptoms.   Flush IV access with Sodium Chloride 0.9% and Heparin 10 units/ml or 100 units/ml   Complete by: As directed    Flush IV access with Sodium Chloride 0.9% and Heparin 10 units/ml or 100 units/ml   Complete by: As directed    Home infusion instructions - [facility]   Complete by: As directed    Instructions: Flush IV access with Sodium Chloride 0.9% and Heparin 10units/ml or 100units/ml   Change dressing on IV access line: Weekly and PRN   Instructions Cath Flo 33m: Administer for PICC Line occlusion and as ordered by physician for other access device   [facility] pharmacist to adjust dose for: Vancomycin, Aminoglycosides and other anti-infective therapies as requested by physician   Home infusion instructions - [facility]   Complete by: As directed    Instructions: Flush IV access with Sodium Chloride 0.9% and Heparin 10units/ml or 100units/ml   Change dressing on IV access line: Weekly and PRN   Instructions Cath Flo 228m Administer for PICC Line occlusion and as ordered by physician for other access device   [facility] pharmacist to adjust dose for: Vancomycin, Aminoglycosides and other anti-infective therapies as requested by physician   Method of administration may be changed at the discretion of home infusion pharmacist based upon assessment of the patient and/or caregiver's ability to self-administer the medication ordered   Complete by: As directed    Method of administration may be changed at the discretion of home infusion pharmacist based upon assessment of the patient and/or caregiver's ability to self-administer the medication ordered   Complete by: As directed    No wound care   Complete by: As directed      Allergies as of 11/22/2020       Reactions   Codeine Nausea Only   Hydromorphone Other (See Comments)   hallucinations       Medication List    TAKE these medications   albuterol 108 (90 Base) MCG/ACT inhaler Commonly known as: Proventil HFA Inhale 2 puffs into the lungs every 4 (four) hours as needed for wheezing or shortness of breath. Needs office visit for any refills.   aspirin 81 MG tablet Take 1 tablet (81 mg total) by mouth daily.   atorvastatin 20 MG tablet Commonly known as: LIPITOR Take 20 mg by mouth daily.   BLACK ELDERBERRY PO Take 2 each by mouth daily as needed (immune support). Gummies   ceFAZolin  IVPB Commonly known as: ANCEF Inject 2 g into the vein every 8 (eight) hours for 26 days. Indication: MSSA Bacteremia First Dose: Yes Last Day of Therapy:  12/17/2020 Labs - Once weekly:  CBC/D and BMP, Labs - Every other week:  ESR and CRP Method of administration: IV Push Method of administration may be  changed at the discretion of home infusion pharmacist based upon assessment of the patient and/or caregiver's ability to self-administer the medication ordered.   diltiazem 360 MG 24 hr capsule Commonly known as: CARDIZEM CD Take 360 mg by mouth daily.   fluticasone 50 MCG/ACT nasal spray Commonly known as: FLONASE Place 2 sprays into both nostrils daily as needed for allergies or rhinitis.   hydrALAZINE 50 MG tablet Commonly known as: APRESOLINE Take 50 mg by mouth 2 (two) times a day.   labetalol 300 MG tablet Commonly known as: NORMODYNE Take 300 mg by mouth 3 (three) times daily.   losartan 100 MG tablet Commonly known as: COZAAR Take 100 mg by mouth daily.   MELATONIN PO Take 12.5 mg by mouth at bedtime as needed (sleep).   ondansetron 4 MG disintegrating tablet Commonly known as: Zofran ODT Take 1 tablet (4 mg total) by mouth every 8 (eight) hours as needed for nausea or vomiting.   oxyCODONE 5 MG immediate release tablet Commonly known as: Roxicodone Take 1-2  tablets (5-10 mg total) by mouth every 6 (six) hours as needed for severe pain.   predniSONE 5 MG tablet Commonly known as: DELTASONE Take 5 mg by mouth daily. Reported on 11/02/2015   tamsulosin 0.4 MG Caps capsule Commonly known as: FLOMAX Take 0.4 mg by mouth daily.   traZODone 150 MG tablet Commonly known as: DESYREL Take 150 mg by mouth at bedtime as needed for sleep.            Discharge Care Instructions  (From admission, onward)         Start     Ordered   11/22/20 0000  Change dressing on IV access line weekly and PRN  (Home infusion instructions - [facility] )        11/22/20 0710   11/22/20 0000  Change dressing on IV access line weekly and PRN  (Home infusion instructions - [facility] )        11/22/20 0710          Follow-up Information    [name], MD. Schedule an appointment as soon as possible for a visit in 2 week(s).   Specialty: Internal Medicine Contact information: [address] [place] [address] [phone]        Michel Bickers, MD Follow up on 12/04/2020.   Specialty: Infectious Diseases Why: 9:30am Contact information: [address] [place] [address] [phone]              Allergies  Allergen Reactions  . Codeine Nausea Only  . Hydromorphone Other (See Comments)    hallucinations     Consultations:  Neurology  Ophthalmology   ID  Procedures/Studies: MR BRAIN WO CONTRAST  Result Date: 11/19/2020 CLINICAL DATA:  Altered mental status.  Fever EXAM: MRI HEAD WITHOUT CONTRAST TECHNIQUE: Multiplanar, multiecho pulse sequences of the brain and surrounding structures were obtained without intravenous contrast. COMPARISON:  Head CT and CTA from yesterday FINDINGS: Brain: No acute infarction, hemorrhage, hydrocephalus, extra-axial collection or mass lesion. Moderate for age FLAIR hyperintensity in the deep white matter attributed to chronic small vessel disease given  medical history. Brain volume is normal. Choroid fissure cyst on the right, incidental. Remote micro hemorrhages in the right thalamus, usually hypertensive Vascular: Normal flow voids. Skull and upper cervical spine: No focal marrow lesion Sinuses/Orbits: The left superior ophthalmic vein was highlighted on prior. Intermittently these vessels appear distended which is usually from Valsalva.  No asymmetry of these veins or the cavernous sinus region. IMPRESSION: 1. No emergent finding. 2. Moderate chronic small vessel ischemia. Electronically Signed   By: Monte Fantasia M.D.   On: 11/19/2020 05:15   MR Venogram Head  Result Date: 11/20/2020 CLINICAL DATA:  Initial evaluation for possible dural venous sinus thrombosis. EXAM: MR VENOGRAM OF THE HEAD WITHOUT CONTRAST TECHNIQUE: Angiographic images of the intracranial venous structures were obtained using MRV technique without intravenous contrast. COMPARISON:  Prior MRI from earlier the same day. FINDINGS: Normal flow related signal seen throughout the superior sagittal sinus to the level of the torcula. Torcula itself appears patent. Transverse and sigmoid sinuses appear patent as do the visualized proximal internal jugular veins. Straight sinus, vein of Galen, internal cerebral veins, and basal veins of Rosenthal appear patent. No evidence for dural sinus thrombosis. No appreciable dural sinus stenosis. IMPRESSION: Normal intracranial MRV. No evidence for dural sinus thrombosis. Electronically Signed   By: Jeannine Boga M.D.   On: 11/20/2020 01:11   NM Pulmonary Perfusion  Result Date: 11/19/2020 CLINICAL DATA:  Shortness of breath EXAM: NUCLEAR MEDICINE PERFUSION LUNG SCAN TECHNIQUE: Perfusion images were obtained in multiple projections after intravenous injection of radiopharmaceutical. Views: Anterior, posterior, left lateral, right lateral, RPO, LPO, RAO, LAO. RADIOPHARMACEUTICALS:  4.1 mCi Tc-26mMAA IV COMPARISON:  Chest radiograph November 18, 2020. FINDINGS: Radiotracer uptake is homogeneous and symmetric bilaterally. No perfusion defects evident. IMPRESSION: No perfusion defects evident. No findings indicative of pulmonary embolus. Electronically Signed   By: WLowella GripIII M.D.   On: 11/19/2020 13:04   DG CHEST PORT 1 VIEW  Result Date: 11/21/2020 CLINICAL DATA:  65year old male respiratory failure.  Sarcoidosis. EXAM: PORTABLE CHEST 1 VIEW COMPARISON:  Portable chest 11/18/2020 and earlier. FINDINGS: Portable AP upright view at 0607 hours. Improved lung volumes from 11/18/2020. Bilateral veiling opacity compatible with small pleural effusions. Cardiac and mediastinal contours remain within normal limits. Visualized tracheal air column is within normal limits. No pneumothorax or pulmonary edema. No definite consolidation. Paucity of bowel gas in the upper abdomen. No acute osseous abnormality identified. IMPRESSION: Improved lung volumes with small bilateral pleural effusions suspected. No overt edema. Electronically Signed   By: HGenevie AnnM.D.   On: 11/21/2020 08:34   DG Chest Port 1 View  Result Date: 11/18/2020 CLINICAL DATA:  Code stroke, questionable sepsis EXAM: PORTABLE CHEST 1 VIEW COMPARISON:  03/14/2019 FINDINGS: Single frontal view of the chest demonstrates marked enlargement the cardiac silhouette. There is central vascular congestion with bibasilar airspace disease and small bilateral effusions. No pneumothorax. No acute bony abnormalities. IMPRESSION: 1. Moderate congestive heart failure. Electronically Signed   By: MRanda NgoM.D.   On: 11/18/2020 19:45   ECHOCARDIOGRAM COMPLETE  Result Date: 11/19/2020    ECHOCARDIOGRAM REPORT   Patient Name:   KXZAYVIER FAGINDate of Exam: 11/19/2020 Medical Rec #:  [id number]      Height:       72.0 in Accession #:    [id number]     Weight:       238.1 lb Date of Birth:  [birth year]      BSA:          2.294 m Patient Age:    89years        BP:           138/86 mmHg Patient  Gender: M  HR:           91 bpm. Exam Location:  Inpatient Procedure: 2D Echo, Cardiac Doppler, Color Doppler and 3D Echo Indications:    Aortic valve disorder I35.9                 Acutre respiratory distress R06.03                 Congestive Heart Failure I50.9  History:        Patient has prior history of Echocardiogram examinations, most                 recent 03/15/2019. Stroke, Previous echo read unicuspid aortic                 valve; Risk Factors:Hypertension. Sacoidosis. CKD III.  Sonographer:    Merrie Roof RDCS Referring Phys: [place]  1. The aortic valve is clearly tricuspid. Suspect prior study report was an error.  2. Left ventricular ejection fraction, by estimation, is 60 to 65%. The left ventricle has normal function. The left ventricle has no regional wall motion abnormalities. Left ventricular diastolic parameters are consistent with Grade I diastolic dysfunction (impaired relaxation).  3. Right ventricular systolic function is normal. The right ventricular size is normal. Tricuspid regurgitation signal is inadequate for assessing PA pressure.  4. Left atrial size was mildly dilated.  5. The mitral valve is grossly normal. Trivial mitral valve regurgitation. No evidence of mitral stenosis.  6. The aortic valve is tricuspid. Aortic valve regurgitation is not visualized. No aortic stenosis is present.  7. The inferior vena cava is dilated in size with >50% respiratory variability, suggesting right atrial pressure of 8 mmHg. FINDINGS  Left Ventricle: Left ventricular ejection fraction, by estimation, is 60 to 65%. The left ventricle has normal function. The left ventricle has no regional wall motion abnormalities. The left ventricular internal cavity size was normal in size. There is  no left ventricular hypertrophy. Left ventricular diastolic parameters are consistent with Grade I diastolic dysfunction (impaired relaxation). Right Ventricle: The right ventricular  size is normal. No increase in right ventricular wall thickness. Right ventricular systolic function is normal. Tricuspid regurgitation signal is inadequate for assessing PA pressure. Left Atrium: Left atrial size was mildly dilated. Right Atrium: Right atrial size was normal in size. Pericardium: Trivial pericardial effusion is present. Mitral Valve: The mitral valve is grossly normal. Trivial mitral valve regurgitation. No evidence of mitral valve stenosis. Tricuspid Valve: The tricuspid valve is grossly normal. Tricuspid valve regurgitation is trivial. No evidence of tricuspid stenosis. Aortic Valve: The aortic valve is tricuspid. Aortic valve regurgitation is not visualized. No aortic stenosis is present. Aortic valve mean gradient measures 8.0 mmHg. Aortic valve peak gradient measures 17.6 mmHg. Aortic valve area, by VTI measures 2.72  cm. Pulmonic Valve: The pulmonic valve was grossly normal. Pulmonic valve regurgitation is not visualized. No evidence of pulmonic stenosis. Aorta: The aortic root is normal in size and structure. Venous: The inferior vena cava is dilated in size with greater than 50% respiratory variability, suggesting right atrial pressure of 8 mmHg. IAS/Shunts: The atrial septum is grossly normal.  LEFT VENTRICLE PLAX 2D LVIDd:         5.90 cm  Diastology LVIDs:         3.60 cm  LV e' medial:    4.57 cm/s LV PW:         1.00 cm  LV E/e' medial:  13.8 LV  IVS:        0.80 cm  LV e' lateral:   9.14 cm/s LVOT diam:     2.10 cm  LV E/e' lateral: 6.9 LV SV:         101 LV SV Index:   44 LVOT Area:     3.46 cm  RIGHT VENTRICLE          IVC RV Basal diam:  5.10 cm  IVC diam: 2.70 cm RV Mid diam:    4.30 cm LEFT ATRIUM            Index       RIGHT ATRIUM           Index LA diam:      4.80 cm  2.09 cm/m  RA Area:     26.10 cm LA Vol (A2C): 102.0 ml 44.46 ml/m RA Volume:   92.60 ml  40.36 ml/m LA Vol (A4C): 120.0 ml 52.30 ml/m  AORTIC VALVE AV Area (Vmax):    2.80 cm AV Area (Vmean):   2.83 cm  AV Area (VTI):     2.72 cm AV Vmax:           210.00 cm/s AV Vmean:          132.000 cm/s AV VTI:            0.370 m AV Peak Grad:      17.6 mmHg AV Mean Grad:      8.0 mmHg LVOT Vmax:         170.00 cm/s LVOT Vmean:        108.000 cm/s LVOT VTI:          0.291 m LVOT/AV VTI ratio: 0.79  AORTA Ao Root diam: 3.50 cm Ao Asc diam:  3.80 cm MITRAL VALVE MV Area (PHT): 3.42 cm     SHUNTS MV Decel Time: 222 msec     Systemic VTI:  0.29 m MV E velocity: 63.00 cm/s   Systemic Diam: 2.10 cm MV A velocity: 102.00 cm/s MV E/A ratio:  0.62 Eleonore Chiquito MD Electronically signed by Eleonore Chiquito MD Signature Date/Time: 11/19/2020/5:43:12 PM    Final    CT HEAD CODE STROKE WO CONTRAST  Result Date: 11/18/2020 CLINICAL DATA:  Code stroke. Neuro deficit, acute, stroke suspected. Additional history provided: Last known well 0845. Expressive aphasia. EXAM: CT HEAD WITHOUT CONTRAST TECHNIQUE: Contiguous axial images were obtained from the base of the skull through the vertex without intravenous contrast. COMPARISON:  Brain MRI 03/14/2019. CT angiogram head/neck 03/14/2019. Noncontrast head CT 03/14/2019. FINDINGS: Brain: Cerebral volume is normal for age. Moderate patchy and ill-defined hypoattenuation within the cerebral white matter which is nonspecific, but compatible with chronic small vessel ischemic disease. There is no acute intracranial hemorrhage. No demarcated cortical infarct is identified. No extra-axial fluid collection. No evidence of intracranial mass. No midline shift. Partially empty sella turcica. Vascular: No hyperdense vessel.  Atherosclerotic calcifications. Skull: Normal. Negative for fracture or focal lesion. Sinuses/Orbits: Visualized orbits show no acute finding. Trace scattered paranasal sinus mucosal thickening. ASPECTS ([place] Stroke Program Early CT Score) - Ganglionic level infarction (caudate, lentiform nuclei, internal capsule, insula, M1-M3 cortex): 7 - Supraganglionic infarction (M4-M6 cortex):  3 Total score (0-10 with 10 being normal): 10 These results were called by telephone at the time of interpretation on 11/18/2020 at 8:13 pm to provider [facility] , who verbally acknowledged these results. IMPRESSION: No evidence of acute infarct or acute intracranial hemorrhage. ASPECTS is 10. Redemonstrated  moderate cerebral white matter chronic small vessel ischemic disease. Minimal scattered paranasal sinus mucosal thickening. Electronically Signed   By: Kellie Simmering DO   On: 11/18/2020 20:14   VAS [place] LOWER EXTREMITY VENOUS (DVT)  Result Date: 11/21/2020  Lower Venous DVT Study Indications: Edema.  Comparison Study: no prior Performing Technologist: Abram Sander RVS  Examination Guidelines: A complete evaluation includes B-mode imaging, spectral Doppler, color Doppler, and power Doppler as needed of all accessible portions of each vessel. Bilateral testing is considered an integral part of a complete examination. Limited examinations for reoccurring indications may be performed as noted. The reflux portion of the exam is performed with the patient in reverse Trendelenburg.  +---------+---------------+---------+-----------+----------+--------------+ RIGHT    CompressibilityPhasicitySpontaneityPropertiesThrombus Aging +---------+---------------+---------+-----------+----------+--------------+ CFV      Full           Yes      Yes                                 +---------+---------------+---------+-----------+----------+--------------+ SFJ      Full                                                        +---------+---------------+---------+-----------+----------+--------------+ FV Prox  Full                                                        +---------+---------------+---------+-----------+----------+--------------+ FV Mid   Full                                                        +---------+---------------+---------+-----------+----------+--------------+ FV  DistalFull                                                        +---------+---------------+---------+-----------+----------+--------------+ PFV      Full                                                        +---------+---------------+---------+-----------+----------+--------------+ POP      Full           Yes      Yes                                 +---------+---------------+---------+-----------+----------+--------------+ PTV      Full                                                        +---------+---------------+---------+-----------+----------+--------------+  PERO     Full                                                        +---------+---------------+---------+-----------+----------+--------------+   +---------+---------------+---------+-----------+----------+-------------------+ LEFT     CompressibilityPhasicitySpontaneityPropertiesThrombus Aging      +---------+---------------+---------+-----------+----------+-------------------+ CFV      Full           Yes      Yes                                      +---------+---------------+---------+-----------+----------+-------------------+ SFJ      Full                                                             +---------+---------------+---------+-----------+----------+-------------------+ FV Prox  Full                                                             +---------+---------------+---------+-----------+----------+-------------------+ FV Mid   Full                                                             +---------+---------------+---------+-----------+----------+-------------------+ FV DistalFull                                                             +---------+---------------+---------+-----------+----------+-------------------+ PFV      Full                                                              +---------+---------------+---------+-----------+----------+-------------------+ POP      Full           Yes      Yes                                      +---------+---------------+---------+-----------+----------+-------------------+ PTV      Full                                                             +---------+---------------+---------+-----------+----------+-------------------+  PERO                                                  Not well visualized +---------+---------------+---------+-----------+----------+-------------------+     Summary: BILATERAL: - No evidence of deep vein thrombosis seen in the lower extremities, bilaterally. - No evidence of superficial venous thrombosis in the lower extremities, bilaterally. -No evidence of popliteal cyst, bilaterally.   *See table(s) above for measurements and observations.    Preliminary    [place] EKG SITE RITE  Result Date: 11/21/2020 If Site Rite image not attached, placement could not be confirmed due to current cardiac rhythm.  CT ANGIO HEAD CODE STROKE  Result Date: 11/18/2020 CLINICAL DATA:  Stroke/TIA, assess extracranial arteries. Stroke/TIA, assess intracranial arteries. EXAM: CT ANGIOGRAPHY HEAD AND NECK TECHNIQUE: Multidetector CT imaging of the head and neck was performed using the standard protocol during bolus administration of intravenous contrast. Multiplanar CT image reconstructions and MIPs were obtained to evaluate the vascular anatomy. Carotid stenosis measurements (when applicable) are obtained utilizing NASCET criteria, using the distal internal carotid diameter as the denominator. CONTRAST:  60 mL Omnipaque 350 intravenous contrast. COMPARISON:  CT angiogram head/neck 03/14/2019. FINDINGS: CTA NECK FINDINGS Aortic arch: Standard aortic branching. No hemodynamically significant innominate or proximal subclavian artery stenosis. Right carotid system: CCA and ICA patent within the neck without stenosis. Tortuosity of  the mid to distal cervical ICA. Minimal calcified plaque at the carotid bifurcations bilaterally. Left carotid system: CCA and ICA patent within the neck without stenosis. Tortuosity of the mid to distal cervical ICA. Vertebral arteries: Vertebral arteries codominant and patent within the neck without stenosis. Skeleton: Cervical spondylosis with multilevel disc space narrowing, disc bulges, uncovertebral hypertrophy and facet arthrosis. No acute bony abnormality or aggressive osseous lesion. Other neck: No neck mass or cervical lymphadenopathy. Thyroid unremarkable. Upper chest: Interlobular septal thickening within the imaged lung apices. Partially imaged layering bilateral pleural effusions. 7 mm right upper lobe pulmonary nodule (series 9, image 342). Redemonstrated calcified mediastinal lymph nodes. Review of the MIP images confirms the above findings CTA HEAD FINDINGS Anterior circulation: The intracranial internal carotid arteries are patent. Mild calcified plaque within both vessels without stenosis. The M1 middle cerebral arteries are patent. No M2 proximal branch occlusion or high-grade proximal stenosis is identified. The left A1 is dominant and the right A1 is diminutive. The left ACA remains dominant with an azygos type ACA appearance. No intracranial aneurysm is identified. Posterior circulation: The intracranial vertebral arteries are patent. The basilar artery is patent. The posterior cerebral arteries are patent. A right posterior communicating artery is present. The left posterior communicating artery is hypoplastic or absent. Venous sinuses: Within the limitations of contrast timing, no convincing thrombus. Anatomic variants: As described. Other: There is asymmetric prominence and enhancement of the left superior ophthalmic vein. No associated asymmetry of the cavernous sinuses is appreciated. Review of the MIP images confirms the above findings These results were called by telephone at the time  of interpretation on 11/18/2020 at 8:40 pm to provider [facility] , who verbally acknowledged these results. IMPRESSION: CTA neck: 1. The bilateral common carotid, internal carotid and vertebral arteries are patent within the neck without stenosis. 2. Interlobular septal thickening within the imaged lung apices, nonspecific but possibly reflecting interstitial edema. Additionally, there are partially imaged layering bilateral pleural effusions. 3. 7 mm right upper  lobe pulmonary nodule. Non-contrast chest CT at 6-12 months is recommended. If the nodule is stable at time of repeat CT, then future CT at 18-24 months (from today's scan) is considered optional for low-risk patients, but is recommended for high-risk patients. This recommendation follows the consensus statement: Guidelines for Management of Incidental Pulmonary Nodules Detected on CT Images: From the Fleischner Society 2017; Radiology 2017; 284:228-243. CTA head: 1. No intracranial large vessel occlusion or proximal high-grade arterial stenosis. 2. Asymmetric prominence and enhancement of the left superior ophthalmic vein, new from the prior CTA head/neck of 03/14/2019. No associated asymmetry of the cavernous sinuses is appreciated and this finding is indeterminate in etiology. Electronically Signed   By: Kellie Simmering DO   On: 11/18/2020 20:54   CT ANGIO NECK CODE STROKE  Result Date: 11/18/2020 CLINICAL DATA:  Stroke/TIA, assess extracranial arteries. Stroke/TIA, assess intracranial arteries. EXAM: CT ANGIOGRAPHY HEAD AND NECK TECHNIQUE: Multidetector CT imaging of the head and neck was performed using the standard protocol during bolus administration of intravenous contrast. Multiplanar CT image reconstructions and MIPs were obtained to evaluate the vascular anatomy. Carotid stenosis measurements (when applicable) are obtained utilizing NASCET criteria, using the distal internal carotid diameter as the denominator. CONTRAST:  60 mL Omnipaque  350 intravenous contrast. COMPARISON:  CT angiogram head/neck 03/14/2019. FINDINGS: CTA NECK FINDINGS Aortic arch: Standard aortic branching. No hemodynamically significant innominate or proximal subclavian artery stenosis. Right carotid system: CCA and ICA patent within the neck without stenosis. Tortuosity of the mid to distal cervical ICA. Minimal calcified plaque at the carotid bifurcations bilaterally. Left carotid system: CCA and ICA patent within the neck without stenosis. Tortuosity of the mid to distal cervical ICA. Vertebral arteries: Vertebral arteries codominant and patent within the neck without stenosis. Skeleton: Cervical spondylosis with multilevel disc space narrowing, disc bulges, uncovertebral hypertrophy and facet arthrosis. No acute bony abnormality or aggressive osseous lesion. Other neck: No neck mass or cervical lymphadenopathy. Thyroid unremarkable. Upper chest: Interlobular septal thickening within the imaged lung apices. Partially imaged layering bilateral pleural effusions. 7 mm right upper lobe pulmonary nodule (series 9, image 342). Redemonstrated calcified mediastinal lymph nodes. Review of the MIP images confirms the above findings CTA HEAD FINDINGS Anterior circulation: The intracranial internal carotid arteries are patent. Mild calcified plaque within both vessels without stenosis. The M1 middle cerebral arteries are patent. No M2 proximal branch occlusion or high-grade proximal stenosis is identified. The left A1 is dominant and the right A1 is diminutive. The left ACA remains dominant with an azygos type ACA appearance. No intracranial aneurysm is identified. Posterior circulation: The intracranial vertebral arteries are patent. The basilar artery is patent. The posterior cerebral arteries are patent. A right posterior communicating artery is present. The left posterior communicating artery is hypoplastic or absent. Venous sinuses: Within the limitations of contrast timing, no  convincing thrombus. Anatomic variants: As described. Other: There is asymmetric prominence and enhancement of the left superior ophthalmic vein. No associated asymmetry of the cavernous sinuses is appreciated. Review of the MIP images confirms the above findings These results were called by telephone at the time of interpretation on 11/18/2020 at 8:40 pm to provider [facility] , who verbally acknowledged these results. IMPRESSION: CTA neck: 1. The bilateral common carotid, internal carotid and vertebral arteries are patent within the neck without stenosis. 2. Interlobular septal thickening within the imaged lung apices, nonspecific but possibly reflecting interstitial edema. Additionally, there are partially imaged layering bilateral pleural effusions. 3. 7 mm right upper lobe  pulmonary nodule. Non-contrast chest CT at 6-12 months is recommended. If the nodule is stable at time of repeat CT, then future CT at 18-24 months (from today's scan) is considered optional for low-risk patients, but is recommended for high-risk patients. This recommendation follows the consensus statement: Guidelines for Management of Incidental Pulmonary Nodules Detected on CT Images: From the Fleischner Society 2017; Radiology 2017; 284:228-243. CTA head: 1. No intracranial large vessel occlusion or proximal high-grade arterial stenosis. 2. Asymmetric prominence and enhancement of the left superior ophthalmic vein, new from the prior CTA head/neck of 03/14/2019. No associated asymmetry of the cavernous sinuses is appreciated and this finding is indeterminate in etiology. Electronically Signed   By: Kellie Simmering DO   On: 11/18/2020 20:54   DG FL GUIDED LUMBAR PUNCTURE  Result Date: 11/19/2020 CLINICAL DATA:  Acute encephalopathy EXAM: DIAGNOSTIC LUMBAR PUNCTURE UNDER FLUOROSCOPIC GUIDANCE COMPARISON:  MRI head 11/19/2020.  MRI lumbar spine 12/18/2014 FLUOROSCOPY TIME:  Fluoroscopy Time:  0 minutes 6 seconds Radiation Exposure  Index (if provided by the fluoroscopic device): Number of Acquired Spot Images: 1 PROCEDURE: Informed consent was obtained from the patient's wife prior to the procedure, including potential complications of headache, allergy, and pain. With the patient prone, the lower back was prepped with Betadine. 1% Lidocaine was used for local anesthesia. Lumbar puncture was performed at the L4-5 level using a 22 gauge needle with return of clear CSF with an opening pressure of 46.5 cm water. 11 ml of CSF were obtained for laboratory studies. The patient tolerated the procedure well and there were no apparent complications. IMPRESSION: Successful lumbar puncture. Elevated opening pressure 46.5 cm water. Clear CSF. Electronically Signed   By: Franchot Gallo M.D.   On: 11/19/2020 13:38    Subjective: Feels very well, up in chair this morning, eager to go home. Fully oriented, conversant. Right shoulder pain stable. No neck or head pain.  Discharge Exam: Vitals:   11/22/20 1011 11/22/20 1203  BP: (!) 203/105 (!) 174/105  Pulse: 90 60  Resp: 19 20  Temp: 98 F (36.7 C) 98 F (36.7 C)  SpO2: 95% 98%   General: Pt is alert, awake, not in acute distress Cardiovascular: RRR, S1/S2 +, no rubs, no gallops Respiratory: CTA bilaterally, no wheezing, no rhonchi Abdominal: Soft, NT, ND, bowel sounds + Extremities: Trace LE edema, no cyanosis  Labs: BNP (last 3 results) Recent Labs    11/18/20 1952  BNP 4,332.9*   Basic Metabolic Panel: Recent Labs  Lab 11/18/20 1858 11/18/20 1921 11/18/20 1922 11/19/20 0233 11/20/20 0706 11/21/20 0302 11/22/20 0140  NA 137   < > 138 135 142 140 138  K 4.2   < > 4.2 4.2 4.2 3.2* 3.1*  CL 98  --  97* 99 103 103 102  CO2 31  --   --  '30 31 29 29  ' GLUCOSE 84  --  82 111* 110* 97 87  BUN 14  --  '16 17 14 ' 6* 8  CREATININE 2.31*  --  2.20* 2.37* 2.02* 1.66* 1.53*  CALCIUM 8.8*  --   --  8.4* 8.6* 8.8* 8.6*  MG  --   --   --   --   --   --  1.9   < > = values in this  interval not displayed.   Liver Function Tests: Recent Labs  Lab 11/18/20 1858 11/19/20 0233 11/20/20 0706 11/22/20 0140  AST 63* 64* 31 23  ALT 74* 73* 54* 36  ALKPHOS 105 103 83 81  BILITOT 1.1 1.4* 1.1 1.0  PROT 6.3* 5.7* 5.6* 6.0*  ALBUMIN 3.4* 3.2* 3.0* 3.2*   No results for input(s): LIPASE, AMYLASE in the last 168 hours. Recent Labs  Lab 11/18/20 2350  AMMONIA 22   CBC: Recent Labs  Lab 11/18/20 1858 11/18/20 1921 11/18/20 1922 11/19/20 0233 11/20/20 0706 11/21/20 0302  WBC 8.0  --   --  7.0 6.4 6.4  NEUTROABS 5.8  --   --  5.1 4.8  --   HGB 13.0 13.6 13.6 12.4* 11.9* 12.0*  HCT 39.4 40.0 40.0 39.4 35.5* 36.2*  MCV 93.8  --   --  96.3 90.8 91.2  PLT 244  --   --  208 200 204   Cardiac Enzymes: No results for input(s): CKTOTAL, CKMB, CKMBINDEX, TROPONINI in the last 168 hours. BNP: Invalid input(s): POCBNP CBG: No results for input(s): GLUCAP in the last 168 hours. D-Dimer No results for input(s): DDIMER in the last 72 hours. Hgb A1c No results for input(s): HGBA1C in the last 72 hours. Lipid Profile No results for input(s): CHOL, HDL, LDLCALC, TRIG, CHOLHDL, LDLDIRECT in the last 72 hours. Thyroid function studies No results for input(s): TSH, T4TOTAL, T3FREE, THYROIDAB in the last 72 hours.  Invalid input(s): FREET3 Anemia work up No results for input(s): VITAMINB12, FOLATE, FERRITIN, TIBC, IRON, RETICCTPCT in the last 72 hours. Urinalysis    Component Value Date/Time   COLORURINE YELLOW 11/18/2020 2345   APPEARANCEUR HAZY (A) 11/18/2020 2345   LABSPEC 1.032 (H) 11/18/2020 2345   PHURINE 5.0 11/18/2020 2345   GLUCOSEU NEGATIVE 11/18/2020 2345   HGBUR MODERATE (A) 11/18/2020 2345   BILIRUBINUR NEGATIVE 11/18/2020 2345   KETONESUR NEGATIVE 11/18/2020 2345   PROTEINUR >=300 (A) 11/18/2020 2345   UROBILINOGEN 0.2 11/16/2012 1004   NITRITE NEGATIVE 11/18/2020 2345   LEUKOCYTESUR NEGATIVE 11/18/2020 2345    Microbiology Recent Results (from  the past 240 hour(s))  Urine culture     Status: Abnormal   Collection Time: 11/18/20  6:58 PM   Specimen: In/Out Cath Urine  Result Value Ref Range Status   Specimen Description IN/OUT CATH URINE  Final   Special Requests   Final    NONE Performed at [facility], [place] [address], [place], [place] [address]    Culture MULTIPLE SPECIES PRESENT, SUGGEST RECOLLECTION (A)  Final   Report Status 11/20/2020 FINAL  Final  Culture, blood (routine x 2)     Status: Abnormal   Collection Time: 11/18/20  6:59 PM   Specimen: BLOOD  Result Value Ref Range Status   Specimen Description BLOOD LEFT ANTECUBITAL  Final   Special Requests   Final    BOTTLES DRAWN AEROBIC AND ANAEROBIC Blood Culture adequate volume   Culture  Setup Time   Final    GRAM POSITIVE COCCI IN CLUSTERS IN BOTH AEROBIC AND ANAEROBIC BOTTLES Organism ID to follow CRITICAL RESULT CALLED TO, READ BACK BY AND VERIFIED WITH: T JACKY PHARMD 1418 11/19/20 A BROWNING    Culture (A)  Final    STAPHYLOCOCCUS AUREUS STAPHYLOCOCCUS EPIDERMIDIS THE SIGNIFICANCE OF ISOLATING THIS ORGANISM FROM A SINGLE SET OF BLOOD CULTURES WHEN MULTIPLE SETS ARE DRAWN IS UNCERTAIN. PLEASE NOTIFY THE MICROBIOLOGY DEPARTMENT WITHIN ONE WEEK IF SPECIATION AND SENSITIVITIES ARE REQUIRED. Performed at [facility], [place] [address], [place], [place] [address]    Report Status 11/21/2020 FINAL  Final   Organism ID, Bacteria STAPHYLOCOCCUS AUREUS  Final  Susceptibility   Staphylococcus aureus - MIC*    CIPROFLOXACIN <=0.5 SENSITIVE Sensitive     ERYTHROMYCIN >=8 RESISTANT Resistant     GENTAMICIN <=0.5 SENSITIVE Sensitive     OXACILLIN 0.5 SENSITIVE Sensitive     TETRACYCLINE <=1 SENSITIVE Sensitive     VANCOMYCIN 1 SENSITIVE Sensitive     TRIMETH/SULFA <=10 SENSITIVE Sensitive     CLINDAMYCIN RESISTANT Resistant     RIFAMPIN <=0.5 SENSITIVE Sensitive     Inducible Clindamycin POSITIVE Resistant     * STAPHYLOCOCCUS AUREUS  Blood Culture  ID Panel (Reflexed)     Status: Abnormal   Collection Time: 11/18/20  6:59 PM  Result Value Ref Range Status   Enterococcus faecalis NOT DETECTED NOT DETECTED Final   Enterococcus Faecium NOT DETECTED NOT DETECTED Final   Listeria monocytogenes NOT DETECTED NOT DETECTED Final   Staphylococcus species DETECTED (A) NOT DETECTED Final    Comment: CRITICAL RESULT CALLED TO, READ BACK BY AND VERIFIED WITH: T JACKY PHARMD 1418 11/19/20 A BROWNING    Staphylococcus aureus (BCID) DETECTED (A) NOT DETECTED Final    Comment: CRITICAL RESULT CALLED TO, READ BACK BY AND VERIFIED WITH: T JACKY PHARMD 1418 11/19/20 A BROWNING    Staphylococcus epidermidis DETECTED (A) NOT DETECTED Final    Comment: CRITICAL RESULT CALLED TO, READ BACK BY AND VERIFIED WITH: Lockie Mola PHARMD 1418 11/19/20 A BROWNING    Staphylococcus lugdunensis NOT DETECTED NOT DETECTED Final   Streptococcus species NOT DETECTED NOT DETECTED Final   Streptococcus agalactiae NOT DETECTED NOT DETECTED Final   Streptococcus pneumoniae NOT DETECTED NOT DETECTED Final   Streptococcus pyogenes NOT DETECTED NOT DETECTED Final   A.calcoaceticus-baumannii NOT DETECTED NOT DETECTED Final   Bacteroides fragilis NOT DETECTED NOT DETECTED Final   Enterobacterales NOT DETECTED NOT DETECTED Final   Enterobacter cloacae complex NOT DETECTED NOT DETECTED Final   Escherichia coli NOT DETECTED NOT DETECTED Final   Klebsiella aerogenes NOT DETECTED NOT DETECTED Final   Klebsiella oxytoca NOT DETECTED NOT DETECTED Final   Klebsiella pneumoniae NOT DETECTED NOT DETECTED Final   Proteus species NOT DETECTED NOT DETECTED Final   Salmonella species NOT DETECTED NOT DETECTED Final   Serratia marcescens NOT DETECTED NOT DETECTED Final   Haemophilus influenzae NOT DETECTED NOT DETECTED Final   Neisseria meningitidis NOT DETECTED NOT DETECTED Final   Pseudomonas aeruginosa NOT DETECTED NOT DETECTED Final   Stenotrophomonas maltophilia NOT DETECTED NOT DETECTED  Final   Candida albicans NOT DETECTED NOT DETECTED Final   Candida auris NOT DETECTED NOT DETECTED Final   Candida glabrata NOT DETECTED NOT DETECTED Final   Candida krusei NOT DETECTED NOT DETECTED Final   Candida parapsilosis NOT DETECTED NOT DETECTED Final   Candida tropicalis NOT DETECTED NOT DETECTED Final   Cryptococcus neoformans/gattii NOT DETECTED NOT DETECTED Final   Methicillin resistance mecA/C DETECTED (A) NOT DETECTED Final    Comment: CRITICAL RESULT CALLED TO, READ BACK BY AND VERIFIED WITH: T JACKY PHARMD 1418 11/19/20 A BROWNING    Meth resistant mecA/C and MREJ NOT DETECTED NOT DETECTED Final    Comment: Performed at [facility], [address] [place], [place] [address]  Culture, blood (routine x 2)     Status: None (Preliminary result)   Collection Time: 11/18/20  7:11 PM   Specimen: BLOOD  Result Value Ref Range Status   Specimen Description BLOOD RIGHT ANTECUBITAL  Final   Special Requests   Final    BOTTLES DRAWN AEROBIC AND ANAEROBIC  Blood Culture adequate volume   Culture   Final    NO GROWTH 4 DAYS Performed at [facility] [address], [place], [place] [address]    Report Status PENDING  Incomplete  Resp Panel by RT-PCR (Flu A&B, Covid) Nasopharyngeal Swab     Status: None   Collection Time: 11/18/20  9:25 PM   Specimen: Nasopharyngeal Swab; Nasopharyngeal(NP) swabs in vial transport medium  Result Value Ref Range Status   SARS Coronavirus 2 by RT PCR NEGATIVE NEGATIVE Final    Comment: (NOTE) SARS-CoV-2 target nucleic acids are NOT DETECTED.  The SARS-CoV-2 RNA is generally detectable in upper respiratory specimens during the acute phase of infection. The lowest concentration of SARS-CoV-2 viral copies this assay can detect is 138 copies/mL. A negative result does not preclude SARS-Cov-2 infection and should not be used as the sole basis for treatment or other patient management decisions. A negative result may occur with  improper  specimen collection/handling, submission of specimen other than nasopharyngeal swab, presence of viral mutation(s) within the areas targeted by this assay, and inadequate number of viral copies(<138 copies/mL). A negative result must be combined with clinical observations, patient history, and epidemiological information. The expected result is Negative.  Fact Sheet for Patients:  EntrepreneurPulse.com.au  Fact Sheet for Healthcare Providers:  IncredibleEmployment.be  This test is no t yet approved or cleared by the [place] FDA and  has been authorized for detection and/or diagnosis of SARS-CoV-2 by FDA under an Emergency Use Authorization (EUA). This EUA will remain  in effect (meaning this test can be used) for the duration of the COVID-19 declaration under Section 564(b)(1) of the Act, 21 U.S.C.section 360bbb-3(b)(1), unless the authorization is terminated  or revoked sooner.       Influenza A by PCR NEGATIVE NEGATIVE Final   Influenza B by PCR NEGATIVE NEGATIVE Final    Comment: (NOTE) The Xpert Xpress SARS-CoV-2/FLU/RSV plus assay is intended as an aid in the diagnosis of influenza from Nasopharyngeal swab specimens and should not be used as a sole basis for treatment. Nasal washings and aspirates are unacceptable for Xpert Xpress SARS-CoV-2/FLU/RSV testing.  Fact Sheet for Patients: EntrepreneurPulse.com.au  Fact Sheet for Healthcare Providers: IncredibleEmployment.be  This test is not yet approved or cleared by the [place] FDA and has been authorized for detection and/or diagnosis of SARS-CoV-2 by FDA under an Emergency Use Authorization (EUA). This EUA will remain in effect (meaning this test can be used) for the duration of the COVID-19 declaration under Section 564(b)(1) of the Act, 21 U.S.C. section 360bbb-3(b)(1), unless the authorization is terminated or revoked.  Performed at  [facility], [place] [address], [place], [place] [id number]   CSF culture w Gram Stain     Status: None   Collection Time: 11/19/20  1:22 PM   Specimen: CSF  Result Value Ref Range Status   Specimen Description CSF  Final   Special Requests NONE  Final   Gram Stain   Final    WBC PRESENT, PREDOMINANTLY MONONUCLEAR NO ORGANISMS SEEN CYTOSPIN SMEAR    Culture   Final    NO GROWTH Performed at [facility], [place] [address], [place], [place] [address]    Report Status 11/22/2020 FINAL  Final  Culture, blood (Routine X 2) w Reflex to ID Panel     Status: None (Preliminary result)   Collection Time: 11/19/20  6:18 PM   Specimen: BLOOD RIGHT ARM  Result Value Ref Range  Status   Specimen Description BLOOD RIGHT ARM  Final   Special Requests   Final    BOTTLES DRAWN AEROBIC AND ANAEROBIC Blood Culture adequate volume   Culture   Final    NO GROWTH 3 DAYS Performed at [facility], [address] [place], [place] [address]    Report Status PENDING  Incomplete  Culture, blood (Routine X 2) w Reflex to ID Panel     Status: None (Preliminary result)   Collection Time: 11/19/20  6:28 PM   Specimen: BLOOD RIGHT HAND  Result Value Ref Range Status   Specimen Description BLOOD RIGHT HAND  Final   Special Requests   Final    BOTTLES DRAWN AEROBIC AND ANAEROBIC Blood Culture results may not be optimal due to an inadequate volume of blood received in culture bottles   Culture   Final    NO GROWTH 3 DAYS Performed at [facility], [place] [address], [place], [place] [address]    Report Status PENDING  Incomplete    Time coordinating discharge: Approximately 40 minutes  Patrecia Pour, MD  [facility] 11/22/2020, 5:49 PM

## 2020-11-22 NOTE — TOC Progression Note (Addendum)
Transition of Care [facility]) - Progression Note    Patient Details  Name: [name] MRN: [id number] Date of Birth: [birth year]  Transition of Care [facility]) CM/SW Contact  Carles Collet, RN Phone Number: 11/22/2020, 9:32 AM  Clinical Narrative:    09:30 Plan for patient to DC today with [facility] services and home IV Abx.  Patient lives at home w wife.  Referral placed to [facility] who will assist with finding [facility] agency for IV administration and PICC line care. Final confirmation of home health services pending.  Patient NOT ready for DC yet.  12:40 PICC placed, education done, referral complete for home IV abx. Patient can DC once current infusion complete and line flushed.     Expected Discharge Plan: Home/Self Care Barriers to Discharge: Continued Medical Work up  Expected Discharge Plan and Services Expected Discharge Plan: Home/Self Care     Post Acute Care Choice: Resumption of Svcs/PTA Provider (outpt PT at [facility]) Living arrangements for the past 2 months: Single Family Home Expected Discharge Date: 11/22/20               DME Arranged: N/A DME Agency: NA       HH Arranged: RN [place] Agency: NA         Social Determinants of Health (SDOH) Interventions    Readmission Risk Interventions No flowsheet data found.

## 2020-11-24 LAB — CULTURE, BLOOD (ROUTINE X 2)
Culture: NO GROWTH
Culture: NO GROWTH
Special Requests: ADEQUATE

## 2020-11-25 LAB — CULTURE, BLOOD (ROUTINE X 2)
Culture: NO GROWTH
Special Requests: ADEQUATE

## 2020-11-26 ENCOUNTER — Ambulatory Visit: Payer: BC Managed Care – PPO | Admitting: Physical Therapy

## 2020-11-28 ENCOUNTER — Ambulatory Visit: Payer: BC Managed Care – PPO | Admitting: Physical Therapy

## 2020-11-28 ENCOUNTER — Other Ambulatory Visit: Payer: Self-pay

## 2020-11-28 DIAGNOSIS — M25612 Stiffness of left shoulder, not elsewhere classified: Secondary | ICD-10-CM | POA: Diagnosis present

## 2020-11-28 DIAGNOSIS — M25511 Pain in right shoulder: Secondary | ICD-10-CM | POA: Diagnosis present

## 2020-11-28 DIAGNOSIS — G8929 Other chronic pain: Secondary | ICD-10-CM | POA: Diagnosis present

## 2020-11-28 DIAGNOSIS — M6281 Muscle weakness (generalized): Secondary | ICD-10-CM

## 2020-11-28 DIAGNOSIS — M25611 Stiffness of right shoulder, not elsewhere classified: Secondary | ICD-10-CM

## 2020-11-28 DIAGNOSIS — R600 Localized edema: Secondary | ICD-10-CM | POA: Insufficient documentation

## 2020-11-28 NOTE — Therapy (Signed)
[place]. [place], [name], [address] Phone: [phone]   Fax:  [phone]  Physical Therapy Treatment  Patient Details  Name: [name] MRN: [id number] Date of Birth: September 01, 1956 Referring Provider (PT): Hedy Camara Date: 11/28/2020   PT End of Session - 11/28/20 0827    Visit Number 2    Date for PT Re-Evaluation 01/12/21    PT Start Time 6073    PT Stop Time 0845    PT Time Calculation (min) 48 min           Past Medical History:  Diagnosis Date  . Back pain   . CKD (chronic kidney disease), stage III ([place])   . CVA (cerebral infarction)   . Diastolic dysfunction   . ED (erectile dysfunction)   . GERD (gastroesophageal reflux disease)   . Hypercalcemia   . Hyperlipidemia   . Hypertension   . Insomnia   . Long-term use of high-risk medication   . Nephrocalcinosis   . Nephrolithiasis   . Obesity   . Osteopenia   . Pancreatitis   . PE (pulmonary embolism)   . Proteinuria   . Sarcoidosis   . Smoker     Past Surgical History:  Procedure Laterality Date  . ABDOMINAL EXPLORATION SURGERY    . BACK SURGERY    . CHOLECYSTECTOMY    . SHOULDER ARTHROSCOPY Right 10/30/2020   Procedure: ARTHROSCOPY SHOULDER WITH EXTENSIVE DEBRIDEMENT;  Surgeon: Mcarthur Rossetti, MD;  Location: [place];  Service: Orthopedics;  Laterality: Right;  . SHOULDER SURGERY    . TRACHEOSTOMY     closed    There were no vitals filed for this visit.   Subjective Assessment - 11/28/20 0758    Subjective pt arrives with Pic line in RT arm and increased distal arma nd hand swelling for recent hospital stay. pt verb doing HEP    Currently in Pain? Yes    Pain Score 6     Pain Location Shoulder    Pain Orientation Right                             [facility] Adult PT Treatment/Exercise - 11/28/20 0001      Exercises   Exercises Shoulder;Elbow      Shoulder Exercises:  Supine   External Rotation 10 reps;AAROM;Both   with cane   Flexion AAROM;10 reps;Both   with cane   ABduction AAROM;10 reps;Both   with cane     Shoulder Exercises: Standing   Internal Rotation AAROM;Both;10 reps   with cane   Extension AAROM;Both;10 reps   with cane   Extension Limitations 2 sets 10 with yellow tband    Row Both;20 reps;Theraband    Theraband Level (Shoulder Row) Level 1 (Yellow)    Other Standing Exercises 3# shld shruggs and backward rolls 2 sets 10      Shoulder Exercises: ROM/Strengthening   UBE (Upper Arm Bike) L 1 2 min fwd/2 back Left UE to move RT - AA      Modalities   Modalities Vasopneumatic      Vasopneumatic   Number Minutes Vasopneumatic  10 minutes    Vasopnuematic Location  Shoulder    Vasopneumatic Pressure Medium    Vasopneumatic Temperature  34      Manual Therapy   Manual Therapy Passive ROM    Passive ROM RT shld all directions  PT Short Term Goals - 11/28/20 0827      PT SHORT TERM GOAL #1   Title Independent with initial HEP    Status Achieved             PT Long Term Goals - 11/17/20 0809      PT LONG TERM GOAL #1   Title Independent with advanced HEP    Time 8    Period Weeks    Status New    Target Date 01/12/21      PT LONG TERM GOAL #2   Title Report </= 2/10 R shoulder pain with ADLs using dominant right side    Time 8    Period Weeks    Status New    Target Date 01/12/21      PT LONG TERM GOAL #3   Title R shoulder AROM symmetrical to left    Time 8    Period Weeks    Status New    Target Date 01/12/21      PT LONG TERM GOAL #4   Title BUE strength 4+/5    Time 8    Period Weeks    Status New    Target Date 01/12/21                 Plan - 11/28/20 7829    Clinical Impression Statement STG met. Excellent PROM but some increase pain with "catch" come down from flexion. started slow AAROM and pt did well- verb and tactile cuing    PT Treatment/Interventions  ADLs/Self Care Home Management;Electrical Stimulation;Cryotherapy;Moist Heat;Neuromuscular re-education;Therapeutic exercise;Therapeutic activities;Functional mobility training;Patient/family education;Manual techniques;Energy conservation;Taping;Vasopneumatic Device;Scar mobilization           Patient will benefit from skilled therapeutic intervention in order to improve the following deficits and impairments:  Decreased range of motion,Impaired UE functional use,Increased muscle spasms,Decreased endurance,Pain,Improper body mechanics,Impaired flexibility,Decreased mobility,Decreased strength,Increased edema  Visit Diagnosis: Muscle weakness (generalized)  Chronic right shoulder pain  Stiffness of right shoulder, not elsewhere classified  Localized edema     Problem List Patient Active Problem List   Diagnosis Date Noted  . ARF (acute renal failure) ([place]) 11/19/2020  . Acute respiratory failure ([place]) 11/19/2020  . Gram-positive bacteremia 11/19/2020  . Cranial neuropathy   . Acute encephalopathy 11/18/2020  . Chronic right shoulder pain 06/18/2020  . OSA (obstructive sleep apnea) 05/05/2020  . Hypersomnia with sleep apnea 12/20/2019  . Psychophysiological insomnia 09/18/2019  . Nocturia more than twice per night 09/18/2019  . Renal hypertension 09/18/2019  . Moderate asthma 09/18/2019  . Tobacco abuse 09/18/2019  . Non-restorative sleep 09/18/2019  . Nontraumatic complete tear of right rotator cuff 08/15/2019  . Delirium 03/16/2019  . Stage 3b chronic kidney disease   . CVA (cerebral infarction)   . Diastolic dysfunction   . Pseudarthrosis 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 02/28/2019  . Intervertebral disc disorder with radiculopathy of lumbar region 09/13/2017  . Acute gouty arthritis 03/07/2017  . Arthralgia of left foot 03/07/2017  . Nephrolithiasis   . Fever, unspecified 11/02/2015  . Essential hypertension 02/07/2015  . Pulmonary embolism ([place]) 01/30/2013  .  Sarcoidosis 01/30/2013  . Hemoptysis 01/30/2013  . SHOULDER PAIN, LEFT 11/28/2009    Cesario Weidinger,ANGIE PTA 11/28/2020, 8:30 AM  [place]. [place], [name], [address] Phone: [phone]   Fax:  [phone]  Name: SANTO ZAHRADNIK MRN: [id number] Date of Birth: [birth year]

## 2020-12-02 ENCOUNTER — Other Ambulatory Visit: Payer: Self-pay

## 2020-12-02 ENCOUNTER — Ambulatory Visit: Payer: BC Managed Care – PPO | Admitting: Physical Therapy

## 2020-12-02 DIAGNOSIS — M6281 Muscle weakness (generalized): Secondary | ICD-10-CM

## 2020-12-02 DIAGNOSIS — M25611 Stiffness of right shoulder, not elsewhere classified: Secondary | ICD-10-CM

## 2020-12-02 DIAGNOSIS — G8929 Other chronic pain: Secondary | ICD-10-CM

## 2020-12-02 DIAGNOSIS — M25511 Pain in right shoulder: Secondary | ICD-10-CM

## 2020-12-02 DIAGNOSIS — R600 Localized edema: Secondary | ICD-10-CM

## 2020-12-02 NOTE — Therapy (Signed)
[place]. [place], [name], [address] Phone: [phone]   Fax:  [phone]  Physical Therapy Treatment  Patient Details  Name: [name] MRN: [id number] Date of Birth: 07-19-1956 Referring Provider (PT): Hedy Camara Date: 12/02/2020   PT End of Session - 12/02/20 0827    Visit Number 3    Number of Visits 17    Date for PT Re-Evaluation 01/12/21    PT Start Time 0757    PT Stop Time 0840    PT Time Calculation (min) 43 min           Past Medical History:  Diagnosis Date  . Back pain   . CKD (chronic kidney disease), stage III ([place])   . CVA (cerebral infarction)   . Diastolic dysfunction   . ED (erectile dysfunction)   . GERD (gastroesophageal reflux disease)   . Hypercalcemia   . Hyperlipidemia   . Hypertension   . Insomnia   . Long-term use of high-risk medication   . Nephrocalcinosis   . Nephrolithiasis   . Obesity   . Osteopenia   . Pancreatitis   . PE (pulmonary embolism)   . Proteinuria   . Sarcoidosis   . Smoker     Past Surgical History:  Procedure Laterality Date  . ABDOMINAL EXPLORATION SURGERY    . BACK SURGERY    . CHOLECYSTECTOMY    . SHOULDER ARTHROSCOPY Right 10/30/2020   Procedure: ARTHROSCOPY SHOULDER WITH EXTENSIVE DEBRIDEMENT;  Surgeon: Mcarthur Rossetti, MD;  Location: [place];  Service: Orthopedics;  Laterality: Right;  . SHOULDER SURGERY    . TRACHEOSTOMY     closed    There were no vitals filed for this visit.   Subjective Assessment - 12/02/20 0759    Subjective " alittle sore"    Currently in Pain? Yes    Pain Score 5     Pain Location Shoulder    Pain Orientation Right                             [facility] Adult PT Treatment/Exercise - 12/02/20 0001      Shoulder Exercises: Standing   External Rotation AAROM;Right;15 reps   cane   Internal Rotation AAROM;Both;15 reps   cane   Flexion  AAROM;Both;15 reps   cane   Extension AAROM;Both;15 reps   cane   Row Both;20 reps;Theraband   plus shld ext   Theraband Level (Shoulder Row) Level 1 (Yellow)    Other Standing Exercises 3# shld shruggs and backward rolls 2 sets 10   3# bicep curl 2 sets 10   Other Standing Exercises cane chest press 15x AA      Shoulder Exercises: ROM/Strengthening   UBE (Upper Arm Bike) L 1 2 min fwd/2 back Left UE to move RT - AA      Vasopneumatic   Number Minutes Vasopneumatic  10 minutes    Vasopnuematic Location  Shoulder    Vasopneumatic Pressure Medium    Vasopneumatic Temperature  34      Manual Therapy   Manual Therapy Passive ROM    Manual therapy comments attempted PROM in supine but pt had trouble breathing so done in sitting    Passive ROM RT shld all directions                    PT Short Term Goals -  11/28/20 0827      PT SHORT TERM GOAL #1   Title Independent with initial HEP    Status Achieved             PT Long Term Goals - 11/17/20 0809      PT LONG TERM GOAL #1   Title Independent with advanced HEP    Time 8    Period Weeks    Status New    Target Date 01/12/21      PT LONG TERM GOAL #2   Title Report </= 2/10 R shoulder pain with ADLs using dominant right side    Time 8    Period Weeks    Status New    Target Date 01/12/21      PT LONG TERM GOAL #3   Title R shoulder AROM symmetrical to left    Time 8    Period Weeks    Status New    Target Date 01/12/21      PT LONG TERM GOAL #4   Title BUE strength 4+/5    Time 8    Period Weeks    Status New    Target Date 01/12/21                 Plan - 12/02/20 1157    Clinical Impression Statement progressing ROM And strength within protocol for 5 weeks.    PT Treatment/Interventions ADLs/Self Care Home Management;Electrical Stimulation;Cryotherapy;Moist Heat;Neuromuscular re-education;Therapeutic exercise;Therapeutic activities;Functional mobility training;Patient/family  education;Manual techniques;Energy conservation;Taping;Vasopneumatic Device;Scar mobilization    PT Next Visit Plan Initiate gentle PROM/AAROM within tolerance, emphasis on within pain free ranges. Manual and mdalities as needed.           Patient will benefit from skilled therapeutic intervention in order to improve the following deficits and impairments:  Decreased range of motion,Impaired UE functional use,Increased muscle spasms,Decreased endurance,Pain,Improper body mechanics,Impaired flexibility,Decreased mobility,Decreased strength,Increased edema  Visit Diagnosis: Muscle weakness (generalized)  Chronic right shoulder pain  Stiffness of right shoulder, not elsewhere classified  Localized edema     Problem List Patient Active Problem List   Diagnosis Date Noted  . ARF (acute renal failure) ([place]) 11/19/2020  . Acute respiratory failure ([place]) 11/19/2020  . Gram-positive bacteremia 11/19/2020  . Cranial neuropathy   . Acute encephalopathy 11/18/2020  . Chronic right shoulder pain 06/18/2020  . OSA (obstructive sleep apnea) 05/05/2020  . Hypersomnia with sleep apnea 12/20/2019  . Psychophysiological insomnia 09/18/2019  . Nocturia more than twice per night 09/18/2019  . Renal hypertension 09/18/2019  . Moderate asthma 09/18/2019  . Tobacco abuse 09/18/2019  . Non-restorative sleep 09/18/2019  . Nontraumatic complete tear of right rotator cuff 08/15/2019  . Delirium 03/16/2019  . Stage 3b chronic kidney disease   . CVA (cerebral infarction)   . Diastolic dysfunction   . Pseudarthrosis 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 02/28/2019  . Intervertebral disc disorder with radiculopathy of lumbar region 09/13/2017  . Acute gouty arthritis 03/07/2017  . Arthralgia of left foot 03/07/2017  . Nephrolithiasis   . Fever, unspecified 11/02/2015  . Essential hypertension 02/07/2015  . Pulmonary embolism ([place]) 01/30/2013  . Sarcoidosis 01/30/2013  . Hemoptysis 01/30/2013  . SHOULDER  PAIN, LEFT 11/28/2009    Rutilio Yellowhair,ANGIE PTA 12/02/2020, 8:31 AM  [place]. [place], [name], [address] Phone: [phone]   Fax:  [phone]  Name: DEMARKIS GHEEN MRN: [id number] Date of Birth: November 22, 1955

## 2020-12-04 ENCOUNTER — Ambulatory Visit: Payer: BC Managed Care – PPO | Admitting: Internal Medicine

## 2020-12-04 ENCOUNTER — Other Ambulatory Visit: Payer: Self-pay

## 2020-12-04 DIAGNOSIS — R0602 Shortness of breath: Secondary | ICD-10-CM | POA: Diagnosis not present

## 2020-12-04 DIAGNOSIS — E8770 Fluid overload, unspecified: Secondary | ICD-10-CM | POA: Diagnosis not present

## 2020-12-04 DIAGNOSIS — R7881 Bacteremia: Secondary | ICD-10-CM

## 2020-12-04 DIAGNOSIS — B9561 Methicillin susceptible Staphylococcus aureus infection as the cause of diseases classified elsewhere: Secondary | ICD-10-CM

## 2020-12-04 DIAGNOSIS — M12811 Other specific arthropathies, not elsewhere classified, right shoulder: Secondary | ICD-10-CM | POA: Diagnosis not present

## 2020-12-04 LAB — VITAMIN B1: Vitamin B1 (Thiamine): 159.5 nmol/L (ref 66.5–200.0)

## 2020-12-04 MED ORDER — FUROSEMIDE 20 MG PO TABS: 20.0000 mg | ORAL_TABLET | Freq: Every day | ORAL | 1 refills | Status: DC

## 2020-12-04 NOTE — Assessment & Plan Note (Signed)
He has developed significant volume overload, weight gain, edema and shortness of breath.  He should not be getting excessive fluid with his IV cefazolin.  I will start him on low-dose furosemide and have recommended that he schedule a visit with his PCP, Dr. [name], as soon as possible.

## 2020-12-04 NOTE — Progress Notes (Signed)
[facility]  Patient Active Problem List   Diagnosis Date Noted  . Rotator cuff arthropathy, right 12/04/2020    Priority: High  . Bacteremia due to methicillin susceptible Staphylococcus aureus (MSSA) 11/19/2020    Priority: High  . Volume overload 12/04/2020    Priority: Medium  . Shortness of breath 12/04/2020    Priority: Medium  . ARF (acute renal failure) ([place]) 11/19/2020  . Acute respiratory failure ([place]) 11/19/2020  . Cranial neuropathy   . Acute encephalopathy 11/18/2020  . Chronic right shoulder pain 06/18/2020  . OSA (obstructive sleep apnea) 05/05/2020  . Hypersomnia with sleep apnea 12/20/2019  . Psychophysiological insomnia 09/18/2019  . Nocturia more than twice per night 09/18/2019  . Renal hypertension 09/18/2019  . Moderate asthma 09/18/2019  . Tobacco abuse 09/18/2019  . Non-restorative sleep 09/18/2019  . Nontraumatic complete tear of right rotator cuff 08/15/2019  . Delirium 03/16/2019  . Stage 3b chronic kidney disease   . CVA (cerebral infarction)   . Diastolic dysfunction   . Pseudarthrosis 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 02/28/2019  . Intervertebral disc disorder with radiculopathy of lumbar region 09/13/2017  . Acute gouty arthritis 03/07/2017  . Arthralgia of left foot 03/07/2017  . Nephrolithiasis   . Fever, unspecified 11/02/2015  . Essential hypertension 02/07/2015  . Pulmonary embolism ([place]) 01/30/2013  . Sarcoidosis 01/30/2013  . Hemoptysis 01/30/2013  . SHOULDER PAIN, LEFT 11/28/2009    Patient's Medications  New Prescriptions   FUROSEMIDE (LASIX) 20 MG TABLET    Take 1 tablet (20 mg total) by mouth daily.  Previous Medications   ALBUTEROL (PROVENTIL HFA) 108 (90 BASE) MCG/ACT INHALER    Inhale 2 puffs into the lungs every 4 (four) hours as needed for wheezing or shortness of breath. Needs office visit for any refills.   ASPIRIN 81 MG TABLET    Take 1 tablet (81 mg total) by mouth daily.    ATORVASTATIN (LIPITOR) 20 MG TABLET    Take 20 mg by mouth daily.   BLACK ELDERBERRY PO    Take 2 each by mouth daily as needed (immune support). Gummies   CEFAZOLIN (ANCEF) IVPB    Inject 2 g into the vein every 8 (eight) hours for 26 days. Indication: MSSA Bacteremia First Dose: Yes Last Day of Therapy:  12/17/2020 Labs - Once weekly:  CBC/D and BMP, Labs - Every other week:  ESR and CRP Method of administration: IV Push Method of administration may be changed at the discretion of home infusion pharmacist based upon assessment of the patient and/or caregiver's ability to self-administer the medication ordered.   DILTIAZEM (CARDIZEM CD) 360 MG 24 HR CAPSULE    Take 360 mg by mouth daily.   FLUTICASONE (FLONASE) 50 MCG/ACT NASAL SPRAY    Place 2 sprays into both nostrils daily as needed for allergies or rhinitis.   HYDRALAZINE (APRESOLINE) 50 MG TABLET    Take 50 mg by mouth 2 (two) times a day.   LABETALOL (NORMODYNE) 300 MG TABLET    Take 300 mg by mouth 3 (three) times daily.   LOSARTAN (COZAAR) 100 MG TABLET    Take 100 mg by mouth daily.   MELATONIN PO    Take 12.5 mg by mouth at bedtime as needed (sleep).   ONDANSETRON (ZOFRAN ODT) 4 MG DISINTEGRATING TABLET    Take 1 tablet (4 mg total) by mouth every 8 (eight) hours as needed for nausea or vomiting.   OXYCODONE (  ROXICODONE) 5 MG IMMEDIATE RELEASE TABLET    Take 1-2 tablets (5-10 mg total) by mouth every 6 (six) hours as needed for severe pain.   PREDNISONE (DELTASONE) 5 MG TABLET    Take 5 mg by mouth daily. Reported on 11/02/2015   TAMSULOSIN (FLOMAX) 0.4 MG CAPS CAPSULE    Take 0.4 mg by mouth daily.   TRAZODONE (DESYREL) 150 MG TABLET    Take 150 mg by mouth at bedtime as needed for sleep.  Modified Medications   No medications on file  Discontinued Medications   No medications on file    Subjective: Mr. Lisenbee is in for his hospital follow-up visit.  He was hospitalized on 11/18/2020 with delirium and sepsis and was found to have  MSSA bacteremia.  It was in only 1 of 2 bottles upon admission.  That set of blood cultures also grew staph epidermidis.  Repeat blood cultures the following day were negative and there was no evidence of endocarditis by TTE.  He improved rapidly.  My partner, Dr. Jule Ser, recommended PICC placement and 4 weeks of IV cefazolin through 12/17/2020.  Mr. Gronau had right shoulder surgery for rotator cuff tear and late February.  He has healed without incident and is undergoing physical therapy.  There is no obvious sign of postoperative infection when he was hospitalized.  He has not had any problems tolerating his cefazolin or his PICC.  However he has noted rapid weight gain since discharged with the development of pitting edema of both lower legs and feet.  He has also developed some shortness of breath and orthopnea with some nonproductive cough.  Review of Systems: Review of Systems  Constitutional: Positive for malaise/fatigue. Negative for chills, diaphoresis and fever.  Respiratory: Positive for cough and shortness of breath. Negative for sputum production and wheezing.   Cardiovascular: Negative for chest pain.  Gastrointestinal: Negative for abdominal pain, diarrhea, nausea and vomiting.  Genitourinary: Negative for dysuria.  Musculoskeletal: Positive for joint pain.    Past Medical History:  Diagnosis Date  . Back pain   . CKD (chronic kidney disease), stage III ([place])   . CVA (cerebral infarction)   . Diastolic dysfunction   . ED (erectile dysfunction)   . GERD (gastroesophageal reflux disease)   . Hypercalcemia   . Hyperlipidemia   . Hypertension   . Insomnia   . Long-term use of high-risk medication   . Nephrocalcinosis   . Nephrolithiasis   . Obesity   . Osteopenia   . Pancreatitis   . PE (pulmonary embolism)   . Proteinuria   . Sarcoidosis   . Smoker     Social History   Tobacco Use  . Smoking status: Former Smoker    Types: Cigarettes  . Smokeless  tobacco: Never Used  . Tobacco comment: quit less than five years ago  Substance Use Topics  . Alcohol use: No    Alcohol/week: 0.0 standard drinks  . Drug use: No    Family History  Problem Relation Age of Onset  . Hypertension Father   . CVA Father   . Lung cancer Father   . Alzheimer's disease Mother   . Hypertension Sister     Allergies  Allergen Reactions  . Codeine Nausea Only  . Hydromorphone Other (See Comments)    hallucinations     Objective: Vitals:   12/04/20 0933  BP: (!) 164/94  Pulse: 79  Temp: 98.7 F (37.1 C)  TempSrc: Oral  SpO2: (!) 82%  Weight: 260 lb (117.9 kg)   Body mass index is 35.26 kg/m.  Physical Exam Constitutional:      Comments: He is pleasant and in no distress.  He seems slightly groggy.  His weight is up 22 pounds over the past 2 weeks  Cardiovascular:     Rate and Rhythm: Normal rate and regular rhythm.     Heart sounds: No murmur heard.   Pulmonary:     Effort: Pulmonary effort is normal.     Breath sounds: Normal breath sounds. No wheezing, rhonchi or rales.  Abdominal:     General: There is distension.     Palpations: Abdomen is soft.     Tenderness: There is no abdominal tenderness.  Musculoskeletal:     Comments: His right shoulder incisions have healed nicely.  There is no unusual redness, swelling or tenderness with palpation.  Skin:    Findings: No rash.     Comments: He has a little bit of bruising at his right upper arm PICC insertion site.     Lab Results 12/03/2020 White blood cell count 9100 Hemoglobin 12.5 Platelets 215,000 Creatinine slightly elevated at 1.33 Sedimentation rate 8 C-reactive protein slightly elevated at 12    Problem List Items Addressed This Visit      High   Bacteremia due to methicillin susceptible Staphylococcus aureus (MSSA)    It seems as though his MSSA bacteremia responded promptly to antibiotic therapy.  I plan on continuing IV cefazolin for another 2 weeks.  He will  follow-up in 1 week.      Rotator cuff arthropathy, right    I do not find any evidence of postoperative shoulder infection.        Medium   Volume overload    He has developed significant volume overload, weight gain, edema and shortness of breath.  He should not be getting excessive fluid with his IV cefazolin.  I will start him on low-dose furosemide and have recommended that he schedule a visit with his PCP, Dr. [name], as soon as possible.      Relevant Medications   furosemide (LASIX) 20 MG tablet   Shortness of breath       Michel Bickers, MD [facility] [place] [phone] pager   [phone] cell 12/04/2020, 10:03 AM

## 2020-12-04 NOTE — Assessment & Plan Note (Signed)
It seems as though his MSSA bacteremia responded promptly to antibiotic therapy.  I plan on continuing IV cefazolin for another 2 weeks.  He will follow-up in 1 week.

## 2020-12-04 NOTE — Assessment & Plan Note (Signed)
I do not find any evidence of postoperative shoulder infection.

## 2020-12-05 ENCOUNTER — Ambulatory Visit: Payer: BC Managed Care – PPO | Attending: Orthopaedic Surgery

## 2020-12-05 DIAGNOSIS — G8929 Other chronic pain: Secondary | ICD-10-CM | POA: Diagnosis present

## 2020-12-05 DIAGNOSIS — M25511 Pain in right shoulder: Secondary | ICD-10-CM | POA: Insufficient documentation

## 2020-12-05 DIAGNOSIS — M25612 Stiffness of left shoulder, not elsewhere classified: Secondary | ICD-10-CM | POA: Diagnosis present

## 2020-12-05 DIAGNOSIS — M6281 Muscle weakness (generalized): Secondary | ICD-10-CM

## 2020-12-05 DIAGNOSIS — R600 Localized edema: Secondary | ICD-10-CM | POA: Insufficient documentation

## 2020-12-05 DIAGNOSIS — M25611 Stiffness of right shoulder, not elsewhere classified: Secondary | ICD-10-CM | POA: Diagnosis present

## 2020-12-05 NOTE — Therapy (Signed)
[place] [place], [name], [address] Phone: [phone]   Fax:  [phone]  Physical Therapy Treatment  Patient Details  Name: [name] MRN: [id number] Date of Birth: March 10, 1956 Referring Provider (PT): [name]   Encounter Date: 12/05/2020   PT End of Session - 12/05/20 0811    Visit Number 4    Number of Visits 17    Date for PT Re-Evaluation 01/12/21    PT Start Time 0800    PT Stop Time 0845    PT Time Calculation (min) 45 min    Activity Tolerance Patient tolerated treatment well    Behavior During Therapy Flat affect           Past Medical History:  Diagnosis Date  . Back pain   . CKD (chronic kidney disease), stage III ([place])   . CVA (cerebral infarction)   . Diastolic dysfunction   . ED (erectile dysfunction)   . GERD (gastroesophageal reflux disease)   . Hypercalcemia   . Hyperlipidemia   . Hypertension   . Insomnia   . Long-term use of high-risk medication   . Nephrocalcinosis   . Nephrolithiasis   . Obesity   . Osteopenia   . Pancreatitis   . PE (pulmonary embolism)   . Proteinuria   . Sarcoidosis   . Smoker     Past Surgical History:  Procedure Laterality Date  . ABDOMINAL EXPLORATION SURGERY    . BACK SURGERY    . CHOLECYSTECTOMY    . SHOULDER ARTHROSCOPY Right 10/30/2020   Procedure: ARTHROSCOPY SHOULDER WITH EXTENSIVE DEBRIDEMENT;  Surgeon: Manuel Rossetti, MD;  Location: [facility];  Service: Orthopedics;  Laterality: Right;  . SHOULDER SURGERY    . TRACHEOSTOMY     closed    There were no vitals filed for this visit.   Subjective Assessment - 12/05/20 0810    Subjective Doing okay - no changes    Pertinent History Previous R shoulder surgery 3 yrs ago. Previous RTC surgery on L shoulder 4-5 yrs ago. HTN. Back surgery ~ 2 yrs ago (has had 3 or 4 ).    Patient Stated Goals to get mobility back, decrease pain    Currently in Pain? Yes     Pain Score 4     Pain Location Shoulder    Pain Orientation Right             [facility] Adult PT Treatment/Exercise - 12/05/20 0001      Shoulder Exercises: Standing   External Rotation AAROM;Right;20 reps   cane   Internal Rotation AAROM;Both;20 reps   cane   Flexion AAROM;Both;15 reps   cane   Extension AAROM;Both;20 reps   cane   Row Both;20 reps;Theraband   plus shld ext   Theraband Level (Shoulder Row) Level 1 (Yellow)    Other Standing Exercises 3# shld shruggs and backward rolls 2 sets 10   3# bicep curl 2 sets 10   Other Standing Exercises cane chest press 10x AA      Shoulder Exercises: ROM/Strengthening   UBE (Upper Arm Bike) L 1 2 min fwd/2 back Left UE to move RT - AA      Modalities   Modalities Cryotherapy;Moist Heat      Moist Heat Therapy   Number Minutes Moist Heat 5 Minutes   right shoulder     Cryotherapy   Number Minutes Cryotherapy 3 Minutes    Cryotherapy Location --  R shoulder   Type of Cryotherapy Ice pack                           Manual Therapy   Manual Therapy Passive ROM    Manual therapy comments Seated    Passive ROM RT shld all directions                    PT Short Term Goals - 11/28/20 0827      PT SHORT TERM GOAL #1   Title Independent with initial HEP    Status Achieved             PT Long Term Goals - 11/17/20 0809      PT LONG TERM GOAL #1   Title Independent with advanced HEP    Time 8    Period Weeks    Status New    Target Date 01/12/21      PT LONG TERM GOAL #2   Title Report </= 2/10 R shoulder pain with ADLs using dominant right side    Time 8    Period Weeks    Status New    Target Date 01/12/21      PT LONG TERM GOAL #3   Title R shoulder AROM symmetrical to left    Time 8    Period Weeks    Status New    Target Date 01/12/21      PT LONG TERM GOAL #4   Title BUE strength 4+/5    Time 8    Period Weeks    Status New    Target Date 01/12/21                 Plan -  12/05/20 0812    Clinical Impression Statement Continues to progress ROM and strength within protocol for 5 weeks. Generally no c/o increased pain with exercises except for AA cane flexion requiring rests. He had questions about ice/cold reporting getting some increased pain with the ice, asked about using heat. Discussed benefits of heat vs cold and advised trial of 10-15 minutes heat before doing exercises at home to help calm shoulder and loosen up, and use of ice pack/cold after exercises or end of day to diminish any swelling or inflammation. trialed short period of moist heat to right shoulder with much improved shoulder pain today    Personal Factors and Comorbidities Past/Current Experience;Comorbidity 2;Fitness;Time since onset of injury/illness/exacerbation    Rehab Potential Good    PT Frequency 2x / week    PT Duration 8 weeks    PT Treatment/Interventions ADLs/Self Care Home Management;Electrical Stimulation;Cryotherapy;Moist Heat;Neuromuscular re-education;Therapeutic exercise;Therapeutic activities;Functional mobility training;Patient/family education;Manual techniques;Energy conservation;Taping;Vasopneumatic Device;Scar mobilization    PT Next Visit Plan Initiate gentle PROM/AAROM within tolerance, emphasis on within pain free ranges. Manual and mdalities as needed.    Consulted and Agree with Plan of Care Patient           Patient will benefit from skilled therapeutic intervention in order to improve the following deficits and impairments:  Decreased range of motion,Impaired UE functional use,Increased muscle spasms,Decreased endurance,Pain,Improper body mechanics,Impaired flexibility,Decreased mobility,Decreased strength,Increased edema  Visit Diagnosis: Muscle weakness (generalized)  Chronic right shoulder pain  Stiffness of right shoulder, not elsewhere classified  Localized edema  Stiffness of left shoulder, not elsewhere classified     Problem List Patient Active  Problem List   Diagnosis Date Noted  . Rotator cuff arthropathy, right 12/04/2020  .  Volume overload 12/04/2020  . Shortness of breath 12/04/2020  . ARF (acute renal failure) ([place]) 11/19/2020  . Acute respiratory failure ([place]) 11/19/2020  . Bacteremia due to methicillin susceptible Staphylococcus aureus (MSSA) 11/19/2020  . Cranial neuropathy   . Acute encephalopathy 11/18/2020  . Chronic right shoulder pain 06/18/2020  . OSA (obstructive sleep apnea) 05/05/2020  . Hypersomnia with sleep apnea 12/20/2019  . Psychophysiological insomnia 09/18/2019  . Nocturia more than twice per night 09/18/2019  . Renal hypertension 09/18/2019  . Moderate asthma 09/18/2019  . Tobacco abuse 09/18/2019  . Non-restorative sleep 09/18/2019  . Nontraumatic complete tear of right rotator cuff 08/15/2019  . Delirium 03/16/2019  . Stage 3b chronic kidney disease   . CVA (cerebral infarction)   . Diastolic dysfunction   . Pseudarthrosis 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 02/28/2019  . Intervertebral disc disorder with radiculopathy of lumbar region 09/13/2017  . Acute gouty arthritis 03/07/2017  . Arthralgia of left foot 03/07/2017  . Nephrolithiasis   . Fever, unspecified 11/02/2015  . Essential hypertension 02/07/2015  . Pulmonary embolism ([place]) 01/30/2013  . Sarcoidosis 01/30/2013  . Hemoptysis 01/30/2013  . SHOULDER PAIN, LEFT 11/28/2009    Hall Busing , PT, DPT 12/05/2020, 8:43 AM  [place]. [place], [name], [address] Phone: [phone]   Fax:  [phone]  Name: [name] MRN: [id number] Date of Birth: [birth year]

## 2020-12-08 LAB — CYTOLOGY - NON PAP

## 2020-12-09 ENCOUNTER — Ambulatory Visit: Payer: BC Managed Care – PPO | Admitting: Physical Therapy

## 2020-12-09 NOTE — Progress Notes (Signed)
Date:  12/10/2020   ID:  [name], DOB [birth year], MRN [id number]  PCP:  [name], MD  Cardiologist: Rex Kras, DO, [facility] (established care 12/10/2020)  REASON FOR CONSULT: Heart failure  REQUESTING PHYSICIAN:  [name], MD [address] [place],  [place] [address]  Chief Complaint  Patient presents with  . Heart failure, unspecified  . New Patient (Initial Visit)    HPI  [name] is a 65 y.o. male who presents to the office with a chief complaint of " heart failure evaluation." Patient's past medical history and cardiovascular risk factors include: Sarcoidosis, HTN, Tobacco abuse, Hx of PE (was on Xarelto for 1 year), OSA not on CPAP, MSSA Bacteremia (March 2022), Hx of cardiac arrest (2002 during his hospitalization for pancreatitis per wife).  He is referred to the office at the request of [name], MD for evaluation of heart failure.  Patient is accompanied by his wife at today's office visit.  Patient was recently hospitalized for an elective right shoulder repair surgery in February 2022 and did well.  However in March 2022 he had an episode of confusion and altered mental status for which he went to ED and was hospitalized.  He was diagnosed with MSSA bacteremia and is currently on IV antibiotics via PICC line.  Since discharge he has noticed shortness of breath at rest and with ambulation, he has lower extremity swelling right greater than left.  He also complains of orthopnea, paroxysmal nocturnal dyspnea and lower extremity swelling.  He followed up with his primary care provider was started on Lasix and potassium.  He has been on Lasix for about 1 week.  He denies any chest pain or syncopal events.  Patient states that he did not have chest discomfort either during the hospitalization or since discharge.  However on November 18, 2020 his high sensitive troponins were elevated at 178 and 177.  Patient states that the shortness of breath, lower  extremity swelling has worsened since hospitalization.  Patient states that he has a history of pulmonary embolism in the past for which she was on Xarelto for 1 year and then later discontinued.  No recurrence of DVT or PE.  FUNCTIONAL STATUS: No structured exercise program or daily routine.   ALLERGIES: Allergies  Allergen Reactions  . Codeine Nausea Only  . Hydromorphone Other (See Comments)    hallucinations     MEDICATION LIST PRIOR TO VISIT: Current Meds  Medication Sig  . albuterol (PROVENTIL HFA) 108 (90 Base) MCG/ACT inhaler Inhale 2 puffs into the lungs every 4 (four) hours as needed for wheezing or shortness of breath. Needs office visit for any refills.  Marland Kitchen aspirin 81 MG tablet Take 1 tablet (81 mg total) by mouth daily.  Marland Kitchen atorvastatin (LIPITOR) 20 MG tablet Take 20 mg by mouth daily.  Marland Kitchen BLACK ELDERBERRY PO Take 2 each by mouth daily as needed (immune support). Gummies  . bumetanide (BUMEX) 1 MG tablet Take 1 tablet (1 mg total) by mouth 2 (two) times daily for 14 days.  Marland Kitchen ceFAZolin (ANCEF) IVPB Inject 2 g into the vein every 8 (eight) hours for 26 days. Indication: MSSA Bacteremia First Dose: Yes Last Day of Therapy:  12/17/2020 Labs - Once weekly:  CBC/D and BMP, Labs - Every other week:  ESR and CRP Method of administration: IV Push Method of administration may be changed at the discretion of home infusion pharmacist based upon assessment of the patient and/or caregiver's ability to self-administer  the medication ordered.  . diltiazem (CARDIZEM CD) 360 MG 24 hr capsule Take 360 mg by mouth daily.  . fluticasone (FLONASE) 50 MCG/ACT nasal spray Place 2 sprays into both nostrils daily as needed for allergies or rhinitis.  Marland Kitchen isosorbide-hydrALAZINE (BIDIL) 20-37.5 MG tablet Take 1 tablet by mouth 3 (three) times daily.  Marland Kitchen KLOR-CON M20 20 MEQ tablet Take 20 mEq by mouth daily.  Marland Kitchen labetalol (NORMODYNE) 300 MG tablet Take 300 mg by mouth 3 (three) times daily.  Marland Kitchen losartan  (COZAAR) 100 MG tablet Take 100 mg by mouth daily.  Marland Kitchen MELATONIN PO Take 12.5 mg by mouth at bedtime as needed (sleep).  . ondansetron (ZOFRAN ODT) 4 MG disintegrating tablet Take 1 tablet (4 mg total) by mouth every 8 (eight) hours as needed for nausea or vomiting.  Marland Kitchen oxyCODONE (ROXICODONE) 5 MG immediate release tablet Take 1-2 tablets (5-10 mg total) by mouth every 6 (six) hours as needed for severe pain.  . predniSONE (DELTASONE) 5 MG tablet Take 5 mg by mouth daily. Reported on 11/02/2015  . tamsulosin (FLOMAX) 0.4 MG CAPS capsule Take 0.4 mg by mouth daily.  . traZODone (DESYREL) 150 MG tablet Take 150 mg by mouth at bedtime as needed for sleep.  . [DISCONTINUED] furosemide (LASIX) 20 MG tablet Take 1 tablet (20 mg total) by mouth daily.  . [DISCONTINUED] hydrALAZINE (APRESOLINE) 50 MG tablet Take 50 mg by mouth 2 (two) times a day.     PAST MEDICAL HISTORY: Past Medical History:  Diagnosis Date  . Back pain   . CKD (chronic kidney disease), stage III ([place])   . CVA (cerebral infarction)   . Diastolic dysfunction   . ED (erectile dysfunction)   . GERD (gastroesophageal reflux disease)   . Hypercalcemia   . Hyperlipidemia   . Hypertension   . Insomnia   . Long-term use of high-risk medication   . Nephrocalcinosis   . Nephrolithiasis   . Obesity   . Osteopenia   . Pancreatitis   . PE (pulmonary embolism)   . Proteinuria   . Sarcoidosis   . Smoker   Patient and wife denies hx of CVA.   PAST SURGICAL HISTORY: Past Surgical History:  Procedure Laterality Date  . ABDOMINAL EXPLORATION SURGERY    . BACK SURGERY    . CHOLECYSTECTOMY    . SHOULDER ARTHROSCOPY Right 10/30/2020   Procedure: ARTHROSCOPY SHOULDER WITH EXTENSIVE DEBRIDEMENT;  Surgeon: Mcarthur Rossetti, MD;  Location: [facility];  Service: Orthopedics;  Laterality: Right;  . SHOULDER SURGERY    . TRACHEOSTOMY     closed    FAMILY HISTORY: The patient family history includes Alzheimer's  disease in his mother; CVA in his father; Heart failure in his sister; Hypertension in his father and sister; Lung cancer in his father.  SOCIAL HISTORY:  The patient  reports that he has been smoking cigarettes. He has been smoking about 0.25 packs per day. He has never used smokeless tobacco. He reports that he does not drink alcohol and does not use drugs.  REVIEW OF SYSTEMS: Review of Systems  Constitutional: Positive for malaise/fatigue. Negative for chills and fever.  HENT: Negative for hoarse voice and nosebleeds.   Eyes: Negative for discharge, double vision and pain.  Cardiovascular: Positive for dyspnea on exertion, leg swelling, orthopnea and paroxysmal nocturnal dyspnea. Negative for chest pain, claudication, near-syncope, palpitations and syncope.  Respiratory: Positive for shortness of breath. Negative for hemoptysis.   Musculoskeletal: Negative for muscle cramps  and myalgias.  Gastrointestinal: Negative for abdominal pain, constipation, diarrhea, hematemesis, hematochezia, melena, nausea and vomiting.  Neurological: Negative for dizziness and light-headedness.    PHYSICAL EXAM: Vitals with BMI 12/10/2020 12/10/2020 12/04/2020  Height - '6\' 0"'  -  Weight - 244 lbs 260 lbs  BMI - 41.32 44.01  Systolic 027 253 664  Diastolic 93 97 94  Pulse 65 71 79    CONSTITUTIONAL: Appears older than stated age, slightly somnolent, well-developed and well-nourished. No acute distress.  SKIN: Skin is warm and dry. No rash noted. No cyanosis. No pallor. No jaundice HEAD: Normocephalic and atraumatic.  EYES: No scleral icterus MOUTH/THROAT: Moist oral membranes.  NECK: No JVD present. No thyromegaly noted. No carotid bruits  LYMPHATIC: No visible cervical adenopathy.  CHEST Normal respiratory effort. No intercostal retractions  LUNGS: Clear to auscultation bilaterally.  No stridor. No wheezes. No rales.  CARDIOVASCULAR: Regular, positive Q0-H4, soft holosystolic murmur heard at the apex, no  gallops or rubs. ABDOMINAL: Obese, soft soft, nontender, distended, positive bowel sounds in all 4 quadrants, no apparent ascites.  EXTREMITIES: Bilateral lower extremity swelling right greater than left, +2 pitting edema right lower extremity +1 left lower extremity, no open wounds or ulcers.  PICC line noted in the right antecubital fossa HEMATOLOGIC: No significant bruising NEUROLOGIC: Oriented to person, place, and time. Nonfocal. Normal muscle tone.  PSYCHIATRIC: Normal mood and affect. Normal behavior. Cooperative  CARDIAC DATABASE: EKG: 12/10/2020: Normal sinus rhythm, 63 bpm, left atrial enlargement, diffuse T waves in the precordial leads suggestive of possible ischemia without underlying injury pattern  Echocardiogram: 11/19/2020: The aortic valve is clearly tricuspid. Suspect prior study report was an error. Left ventricular ejection fraction, by estimation, is 60 to 65%. The left ventricle has normal function. The left ventricle has no regional wall motion abnormalities. Left ventricular diastolic parameters are consistent with Grade I diastolic dysfunction (impaired relaxation). Right ventricular systolic function is normal. The right ventricular size is normal. Tricuspid regurgitation signal is inadequate for assessing  PA pressure. Left atrial size was mildly dilated. The mitral valve is grossly normal. Trivial mitral valve regurgitation. No evidence of mitral stenosis. The aortic valve is tricuspid. Aortic valve regurgitation is not visualized. No aortic stenosis is present. The inferior vena cava is dilated in size with >50% respiratory variability, suggesting right atrial pressure of 8 mmHg.   Stress Testing: No results found for this or any previous visit from the past 1095 days.   Heart Catheterization: None  LABORATORY DATA: CBC Latest Ref Rng & Units 11/21/2020 11/20/2020 11/19/2020  WBC 4.0 - 10.5 K/uL 6.4 6.4 7.0  Hemoglobin 13.0 - 17.0 g/dL 12.0(L) 11.9(L) 12.4(L)   Hematocrit 39.0 - 52.0 % 36.2(L) 35.5(L) 39.4  Platelets 150 - 400 K/uL 204 200 208    CMP Latest Ref Rng & Units 11/22/2020 11/21/2020 11/20/2020  Glucose 70 - 99 mg/dL 87 97 110(H)  BUN 8 - 23 mg/dL 8 6(L) 14  Creatinine 0.61 - 1.24 mg/dL 1.53(H) 1.66(H) 2.02(H)  Sodium 135 - 145 mmol/L 138 140 142  Potassium 3.5 - 5.1 mmol/L 3.1(L) 3.2(L) 4.2  Chloride 98 - 111 mmol/L 102 103 103  CO2 22 - 32 mmol/L '29 29 31  ' Calcium 8.9 - 10.3 mg/dL 8.6(L) 8.8(L) 8.6(L)  Total Protein 6.5 - 8.1 g/dL 6.0(L) - 5.6(L)  Total Bilirubin 0.3 - 1.2 mg/dL 1.0 - 1.1  Alkaline Phos 38 - 126 U/L 81 - 83  AST 15 - 41 U/L 23 - 31  ALT 0 -  44 U/L 36 - 54(H)    Lipid Panel     Component Value Date/Time   CHOL 123 03/15/2019 0425   TRIG 130 03/15/2019 0425   HDL 32 (L) 03/15/2019 0425   CHOLHDL 3.8 03/15/2019 0425   VLDL 26 03/15/2019 0425   LDLCALC 65 03/15/2019 0425    No components found for: NTPROBNP No results for input(s): PROBNP in the last 8760 hours. Recent Labs    11/19/20 0233  TSH 1.247    BMP Recent Labs    11/20/20 0706 11/21/20 0302 11/22/20 0140  NA 142 140 138  K 4.2 3.2* 3.1*  CL 103 103 102  CO2 '31 29 29  ' GLUCOSE 110* 97 87  BUN 14 6* 8  CREATININE 2.02* 1.66* 1.53*  CALCIUM 8.6* 8.8* 8.6*  GFRNONAA 36* 46* 51*    HEMOGLOBIN A1C Lab Results  Component Value Date   HGBA1C 5.8 (H) 03/15/2019   MPG 120 03/15/2019    External Labs: Collected: 12/08/2020 Creatinine 1.7 mg/dL. (Serum creatinine 1.6 mg/dL collected 07/11/2020) eGFR: 40.9 mL/min per 1.73 m Sodium 143, potassium 3.5, chloride 104, bicarb 32 Hemoglobin 12.6, hematocrit 38.2 NT proBNP 5576  IMPRESSION:    ICD-10-CM   1. Acute heart failure with preserved ejection fraction (HFpEF) (HCC)  I50.31 EKG 12-Lead    isosorbide-hydrALAZINE (BIDIL) 20-37.5 MG tablet    bumetanide (BUMEX) 1 MG tablet    PCV ECHOCARDIOGRAM COMPLETE    Basic metabolic panel    Pro b natriuretic peptide (BNP)    Magnesium   2. Leg swelling  M79.89 US Venous Img Lower Bilateral (DVT)    CANCELED: VAS [place] LOWER EXTREMITY VENOUS (DVT)  3. Essential hypertension  I10   4. Bacteremia due to methicillin susceptible Staphylococcus aureus (MSSA)  R78.81    B95.61   5. Mixed hyperlipidemia  E78.2   6. Dyspnea on exertion  R06.00   7. Tobacco abuse  Z72.0   8. Hx pulmonary embolism  Z86.711   9. OSA (obstructive sleep apnea)  G47.33   10. Hx of cardiac arrest  Z86.74      RECOMMENDATIONS: [name] is a 65 y.o. male whose past medical history and cardiac risk factors include: Sarcoidosis, HTN, Tobacco abuse, Hx of PE (was on Xarelto for 1 year), OSA not on CPAP, MSSA Bacteremia (March 2022), Hx of cardiac arrest (2002 during his hospitalization for pancreatitis per wife).  Acute heart failure with preserved EF: Most recent echocardiogram from March 2022 notes preserved LVEF. However both the patient and wife note that clinically he has decompensated and has more symptoms of heart failure since that last echo.  Plan to repeat echocardiogram to evaluate LVEF and valvular heart disease given his recent MSSA infection.  Also on the differential diagnosis is endocarditis given his bacteremia. Discontinue Lasix Start Bumex 1 mg p.o. twice daily Blood work in 1 week to evaluate kidney function and electrolytes Discontinue hydralazine 50 mg p.o. twice daily transition to BiDil. Patient is asked to keep a log of his blood pressures and weight on daily basis and to bring it in at the next visit If he has any worsening heart failure symptoms, new onset of chest pain, or syncopal events he should go to the hospital for early evaluation. I also believe that he will benefit from a sleep study evaluation.  He has an appointment with [facility] I have asked them to bring this up at the next visit.  Lower extremity swelling: He has asymmetric  lower extremity swelling right worse than the left. He also has a  history of pulmonary embolism. He had a VQ scan during his recent hospitalization which was negative for PE I would still pursue a lower extremity duplex to rule out DVT. Lower extremity ultrasound ordered to be done today at [facility]  Benign essential hypertension: Blood pressures are not well controlled. Medications reconciled. Transition hydralazine to BiDil for now. Blood pressure log recommended. Low-salt diet recommended. We will monitor.  MSSA bacteremia: Currently following infectious disease and is on antibiotics via PICC line.  Will await transthoracic echo images but will consider possible TEE if needed.   Smoking: Tobacco cessation counseling: Currently smoking 0.5 packs/day   Patient was informed of the dangers of tobacco abuse including stroke, cancer, and MI, as well as benefits of tobacco cessation. Patient is not willing to quit at this time. Approximately 8 mins were spent counseling patient cessation techniques. We discussed various methods to help quit smoking, including deciding on a date to quit, joining a support group, pharmacological agents- nicotine gum/patch/lozenges, chantix.  I will reassess his progress at the next follow-up visit  Patient will benefit from an ischemic evaluation in the near future; however, he currently does not have any chest pain and would like him to be euvolemic prior to additional testing.   FINAL MEDICATION LIST END OF ENCOUNTER: Meds ordered this encounter  Medications  . isosorbide-hydrALAZINE (BIDIL) 20-37.5 MG tablet    Sig: Take 1 tablet by mouth 3 (three) times daily.    Dispense:  270 tablet    Refill:  0  . bumetanide (BUMEX) 1 MG tablet    Sig: Take 1 tablet (1 mg total) by mouth 2 (two) times daily for 14 days.    Dispense:  28 tablet    Refill:  0    Medications Discontinued During This Encounter  Medication Reason  . furosemide (LASIX) 20 MG tablet Change in therapy  . hydrALAZINE (APRESOLINE) 50 MG  tablet Discontinued by provider     Current Outpatient Medications:  .  albuterol (PROVENTIL HFA) 108 (90 Base) MCG/ACT inhaler, Inhale 2 puffs into the lungs every 4 (four) hours as needed for wheezing or shortness of breath. Needs office visit for any refills., Disp: 1 Inhaler, Rfl: 0 .  aspirin 81 MG tablet, Take 1 tablet (81 mg total) by mouth daily., Disp: , Rfl:  .  atorvastatin (LIPITOR) 20 MG tablet, Take 20 mg by mouth daily., Disp: , Rfl:  .  BLACK ELDERBERRY PO, Take 2 each by mouth daily as needed (immune support). Gummies, Disp: , Rfl:  .  bumetanide (BUMEX) 1 MG tablet, Take 1 tablet (1 mg total) by mouth 2 (two) times daily for 14 days., Disp: 28 tablet, Rfl: 0 .  ceFAZolin (ANCEF) IVPB, Inject 2 g into the vein every 8 (eight) hours for 26 days. Indication: MSSA Bacteremia First Dose: Yes Last Day of Therapy:  12/17/2020 Labs - Once weekly:  CBC/D and BMP, Labs - Every other week:  ESR and CRP Method of administration: IV Push Method of administration may be changed at the discretion of home infusion pharmacist based upon assessment of the patient and/or caregiver's ability to self-administer the medication ordered., Disp: 78 Units, Rfl: 0 .  diltiazem (CARDIZEM CD) 360 MG 24 hr capsule, Take 360 mg by mouth daily., Disp: , Rfl:  .  fluticasone (FLONASE) 50 MCG/ACT nasal spray, Place 2 sprays into both nostrils daily as needed for allergies or rhinitis.,  Disp: , Rfl:  .  isosorbide-hydrALAZINE (BIDIL) 20-37.5 MG tablet, Take 1 tablet by mouth 3 (three) times daily., Disp: 270 tablet, Rfl: 0 .  KLOR-CON M20 20 MEQ tablet, Take 20 mEq by mouth daily., Disp: , Rfl:  .  labetalol (NORMODYNE) 300 MG tablet, Take 300 mg by mouth 3 (three) times daily., Disp: , Rfl:  .  losartan (COZAAR) 100 MG tablet, Take 100 mg by mouth daily., Disp: , Rfl:  .  MELATONIN PO, Take 12.5 mg by mouth at bedtime as needed (sleep)., Disp: , Rfl:  .  ondansetron (ZOFRAN ODT) 4 MG disintegrating tablet, Take 1  tablet (4 mg total) by mouth every 8 (eight) hours as needed for nausea or vomiting., Disp: 20 tablet, Rfl: 0 .  oxyCODONE (ROXICODONE) 5 MG immediate release tablet, Take 1-2 tablets (5-10 mg total) by mouth every 6 (six) hours as needed for severe pain., Disp: 30 tablet, Rfl: 0 .  predniSONE (DELTASONE) 5 MG tablet, Take 5 mg by mouth daily. Reported on 11/02/2015, Disp: , Rfl:  .  tamsulosin (FLOMAX) 0.4 MG CAPS capsule, Take 0.4 mg by mouth daily., Disp: , Rfl:  .  traZODone (DESYREL) 150 MG tablet, Take 150 mg by mouth at bedtime as needed for sleep., Disp: , Rfl:   Orders Placed This Encounter  Procedures  . US Venous Img Lower Bilateral (DVT)  . Basic metabolic panel  . Pro b natriuretic peptide (BNP)  . Magnesium  . EKG 12-Lead  . PCV ECHOCARDIOGRAM COMPLETE    Patient Instructions  Discontinue Lasix and start Bumex, prescription sent to pharmacy. Discontinue hydralazine and start BiDil, prescription sent to pharmacy. Blood work in 1 week to evaluate kidney function and electrolytes. Lower extremity duplex ultrasound today to rule out deep venous thrombosis-to be done either at [facility] or [facility] Check daily weights and blood pressure in the morning and evening and bring the logs in at the next visit for review. Echocardiogram prior to the next office visit And discussed undergoing repeat sleep study when you see [facility].   --Continue cardiac medications as reconciled in final medication list. --Return in about 1 week (around 12/17/2020) for Follow up, heart failure management.. Or sooner if needed. --Continue follow-up with your primary care physician regarding the management of your other chronic comorbid conditions.  Patient's questions and concerns were addressed to his satisfaction. He voices understanding of the instructions provided during this encounter.   This note was created using a voice recognition software as a result  there may be grammatical errors inadvertently enclosed that do not reflect the nature of this encounter. Every attempt is made to correct such errors.  Rex Kras, [place], [facility]  Pager: [phone] Office: [phone]

## 2020-12-10 ENCOUNTER — Encounter: Payer: Self-pay | Admitting: Cardiology

## 2020-12-10 ENCOUNTER — Other Ambulatory Visit: Payer: Self-pay

## 2020-12-10 ENCOUNTER — Ambulatory Visit: Payer: BC Managed Care – PPO | Admitting: Cardiology

## 2020-12-10 ENCOUNTER — Ambulatory Visit
Admission: RE | Admit: 2020-12-10 | Discharge: 2020-12-10 | Disposition: A | Discharge status: Home / Self Care | Payer: BC Managed Care – PPO | Source: Ambulatory Visit | Attending: Cardiology | Admitting: Cardiology

## 2020-12-10 VITALS — BP 157/93 | HR 65 | Temp 98.2°F | Resp 16 | Ht 72.0 in | Wt 244.0 lb

## 2020-12-10 DIAGNOSIS — Z8674 Personal history of sudden cardiac arrest: Secondary | ICD-10-CM

## 2020-12-10 DIAGNOSIS — Z86711 Personal history of pulmonary embolism: Secondary | ICD-10-CM

## 2020-12-10 DIAGNOSIS — E782 Mixed hyperlipidemia: Secondary | ICD-10-CM

## 2020-12-10 DIAGNOSIS — R7881 Bacteremia: Secondary | ICD-10-CM

## 2020-12-10 DIAGNOSIS — I10 Essential (primary) hypertension: Secondary | ICD-10-CM

## 2020-12-10 DIAGNOSIS — R0600 Dyspnea, unspecified: Secondary | ICD-10-CM

## 2020-12-10 DIAGNOSIS — M7989 Other specified soft tissue disorders: Secondary | ICD-10-CM

## 2020-12-10 DIAGNOSIS — G4733 Obstructive sleep apnea (adult) (pediatric): Secondary | ICD-10-CM

## 2020-12-10 DIAGNOSIS — Z720 Tobacco use: Secondary | ICD-10-CM

## 2020-12-10 DIAGNOSIS — I5031 Acute diastolic (congestive) heart failure: Secondary | ICD-10-CM

## 2020-12-10 DIAGNOSIS — B9561 Methicillin susceptible Staphylococcus aureus infection as the cause of diseases classified elsewhere: Secondary | ICD-10-CM

## 2020-12-10 DIAGNOSIS — R0609 Other forms of dyspnea: Secondary | ICD-10-CM

## 2020-12-10 MED ORDER — BIDIL 20-37.5 MG PO TABS
1.0000 | ORAL_TABLET | Freq: Three times a day (TID) | ORAL | 0 refills | Status: DC
Start: 2020-12-10 — End: 2020-12-18

## 2020-12-10 MED ORDER — BUMETANIDE 1 MG PO TABS: 1.0000 mg | ORAL_TABLET | Freq: Two times a day (BID) | ORAL | 0 refills | Status: DC

## 2020-12-10 NOTE — Patient Instructions (Addendum)
Discontinue Lasix and start Bumex, prescription sent to pharmacy. Discontinue hydralazine and start BiDil, prescription sent to pharmacy. Blood work in 1 week to evaluate kidney function and electrolytes. Lower extremity duplex ultrasound today to rule out deep venous thrombosis-to be done either at [facility] or [facility] Check daily weights and blood pressure in the morning and evening and bring the logs in at the next visit for review. Echocardiogram prior to the next office visit And discussed undergoing repeat sleep study when you see [facility].

## 2020-12-11 ENCOUNTER — Encounter: Payer: Self-pay | Admitting: Internal Medicine

## 2020-12-11 ENCOUNTER — Ambulatory Visit: Payer: BC Managed Care – PPO | Admitting: Internal Medicine

## 2020-12-11 DIAGNOSIS — R7881 Bacteremia: Secondary | ICD-10-CM | POA: Diagnosis not present

## 2020-12-11 DIAGNOSIS — M12811 Other specific arthropathies, not elsewhere classified, right shoulder: Secondary | ICD-10-CM | POA: Diagnosis not present

## 2020-12-11 DIAGNOSIS — E8770 Fluid overload, unspecified: Secondary | ICD-10-CM

## 2020-12-11 DIAGNOSIS — B9561 Methicillin susceptible Staphylococcus aureus infection as the cause of diseases classified elsewhere: Secondary | ICD-10-CM | POA: Diagnosis not present

## 2020-12-11 NOTE — Assessment & Plan Note (Signed)
He is currently being evaluated for CHF.  Results of repeat echocardiogram are pending.

## 2020-12-11 NOTE — Assessment & Plan Note (Signed)
He appears to be responding well to therapy for MSSA bacteremia.  I will need to see the results of his repeat echocardiogram to make sure that endocarditis was not missed when he was in the hospital to help determine optimal duration of IV cefazolin therapy.  He will follow-up here on 12/23/2020.

## 2020-12-11 NOTE — Progress Notes (Signed)
[facility]  Patient Active Problem List   Diagnosis Date Noted  . Rotator cuff arthropathy, right 12/04/2020    Priority: High  . Bacteremia due to methicillin susceptible Staphylococcus aureus (MSSA) 11/19/2020    Priority: High  . Volume overload 12/04/2020    Priority: Medium  . Shortness of breath 12/04/2020    Priority: Medium  . ARF (acute renal failure) ([place]) 11/19/2020  . Acute respiratory failure ([place]) 11/19/2020  . Cranial neuropathy   . Acute encephalopathy 11/18/2020  . Chronic right shoulder pain 06/18/2020  . OSA (obstructive sleep apnea) 05/05/2020  . Hypersomnia with sleep apnea 12/20/2019  . Psychophysiological insomnia 09/18/2019  . Nocturia more than twice per night 09/18/2019  . Renal hypertension 09/18/2019  . Moderate asthma 09/18/2019  . Tobacco abuse 09/18/2019  . Non-restorative sleep 09/18/2019  . Nontraumatic complete tear of right rotator cuff 08/15/2019  . Delirium 03/16/2019  . Stage 3b chronic kidney disease   . CVA (cerebral infarction)   . Diastolic dysfunction   . Pseudarthrosis 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 02/28/2019  . Intervertebral disc disorder with radiculopathy of lumbar region 09/13/2017  . Acute gouty arthritis 03/07/2017  . Arthralgia of left foot 03/07/2017  . Nephrolithiasis   . Fever, unspecified 11/02/2015  . Essential hypertension 02/07/2015  . Pulmonary embolism ([place]) 01/30/2013  . Sarcoidosis 01/30/2013  . Hemoptysis 01/30/2013  . SHOULDER PAIN, LEFT 11/28/2009    Patient's Medications  New Prescriptions   No medications on file  Previous Medications   ALBUTEROL (PROVENTIL HFA) 108 (90 BASE) MCG/ACT INHALER    Inhale 2 puffs into the lungs every 4 (four) hours as needed for wheezing or shortness of breath. Needs office visit for any refills.   ASPIRIN 81 MG TABLET    Take 1 tablet (81 mg total) by mouth daily.   ATORVASTATIN (LIPITOR) 20 MG TABLET    Take 20 mg by mouth daily.    BLACK ELDERBERRY PO    Take 2 each by mouth daily as needed (immune support). Gummies   BUMETANIDE (BUMEX) 1 MG TABLET    Take 1 tablet (1 mg total) by mouth 2 (two) times daily for 14 days.   CEFAZOLIN (ANCEF) IVPB    Inject 2 g into the vein every 8 (eight) hours for 26 days. Indication: MSSA Bacteremia First Dose: Yes Last Day of Therapy:  12/17/2020 Labs - Once weekly:  CBC/D and BMP, Labs - Every other week:  ESR and CRP Method of administration: IV Push Method of administration may be changed at the discretion of home infusion pharmacist based upon assessment of the patient and/or caregiver's ability to self-administer the medication ordered.   DILTIAZEM (CARDIZEM CD) 360 MG 24 HR CAPSULE    Take 360 mg by mouth daily.   FLUTICASONE (FLONASE) 50 MCG/ACT NASAL SPRAY    Place 2 sprays into both nostrils daily as needed for allergies or rhinitis.   ISOSORBIDE-HYDRALAZINE (BIDIL) 20-37.5 MG TABLET    Take 1 tablet by mouth 3 (three) times daily.   KLOR-CON M20 20 MEQ TABLET    Take 20 mEq by mouth daily.   LABETALOL (NORMODYNE) 300 MG TABLET    Take 300 mg by mouth 3 (three) times daily.   LOSARTAN (COZAAR) 100 MG TABLET    Take 100 mg by mouth daily.   MELATONIN PO    Take 12.5 mg by mouth at bedtime as needed (sleep).   ONDANSETRON (ZOFRAN ODT) 4  MG DISINTEGRATING TABLET    Take 1 tablet (4 mg total) by mouth every 8 (eight) hours as needed for nausea or vomiting.   OXYCODONE (ROXICODONE) 5 MG IMMEDIATE RELEASE TABLET    Take 1-2 tablets (5-10 mg total) by mouth every 6 (six) hours as needed for severe pain.   PREDNISONE (DELTASONE) 5 MG TABLET    Take 5 mg by mouth daily. Reported on 11/02/2015   TAMSULOSIN (FLOMAX) 0.4 MG CAPS CAPSULE    Take 0.4 mg by mouth daily.   TRAZODONE (DESYREL) 150 MG TABLET    Take 150 mg by mouth at bedtime as needed for sleep.  Modified Medications   No medications on file  Discontinued Medications   No medications on file    Subjective: Mr. Bugarin is in  for his routine follow-up visit.  He was hospitalized on 11/18/2020 with delirium and sepsis and was found to have MSSA bacteremia.  It was in only 1 of 2 bottles upon admission.  That set of blood cultures also grew staph epidermidis.  Repeat blood cultures the following day were negative and there was no evidence of endocarditis by TTE.  He improved rapidly.  My partner, Dr. Jule Ser, recommended PICC placement and 4 weeks of IV cefazolin through 12/17/2020.  Mr. Sabic had right shoulder surgery for rotator cuff tear and late February.  He has healed without incident and is undergoing physical therapy.  There is no obvious sign of postoperative infection when he was hospitalized.  He has not had any problems tolerating his cefazolin or his PICC.  When I saw him last week he had experienced dramatic weight gain secondary to volume overload and CHF.  He was seen by cardiology yesterday who stopped his Lasix and hydrochlorothiazide and started BiDil and bumetanide.  He has lost 20 pounds in the last week and is feeling much better.  An echocardiogram was performed yesterday but the results are not available to me yet.  Review of Systems: Review of Systems  Constitutional: Positive for malaise/fatigue. Negative for chills, diaphoresis and fever.  Respiratory: Positive for shortness of breath. Negative for cough, sputum production and wheezing.   Cardiovascular: Negative for chest pain.  Gastrointestinal: Negative for abdominal pain, diarrhea, nausea and vomiting.  Genitourinary: Negative for dysuria.  Musculoskeletal: Positive for joint pain.    Past Medical History:  Diagnosis Date  . Back pain   . CKD (chronic kidney disease), stage III ([place])   . CVA (cerebral infarction)   . Diastolic dysfunction   . ED (erectile dysfunction)   . GERD (gastroesophageal reflux disease)   . Hypercalcemia   . Hyperlipidemia   . Hypertension   . Insomnia   . Long-term use of high-risk medication   .  Nephrocalcinosis   . Nephrolithiasis   . Obesity   . Osteopenia   . Pancreatitis   . PE (pulmonary embolism)   . Proteinuria   . Sarcoidosis   . Smoker     Social History   Tobacco Use  . Smoking status: Current Some Day Smoker    Packs/day: 0.25    Types: Cigarettes    Start date: 11/27/2020  . Smokeless tobacco: Never Used  Vaping Use  . Vaping Use: Never used  Substance Use Topics  . Alcohol use: No    Alcohol/week: 0.0 standard drinks  . Drug use: No    Family History  Problem Relation Age of Onset  . Hypertension Father   . CVA Father   .  Lung cancer Father   . Alzheimer's disease Mother   . Hypertension Sister   . Heart failure Sister     Allergies  Allergen Reactions  . Codeine Nausea Only  . Hydromorphone Other (See Comments)    hallucinations     Objective: Vitals:   12/11/20 0844  BP: (!) 158/92  Pulse: 92  Temp: 98 F (36.7 C)  Weight: 240 lb (108.9 kg)  Height: 6' (1.829 m)   Body mass index is 32.55 kg/m.  Physical Exam Constitutional:      Comments: He is in good spirits.  His weight is down 20 pounds since his visit last week.  Cardiovascular:     Rate and Rhythm: Normal rate and regular rhythm.     Heart sounds: No murmur heard.   Pulmonary:     Effort: Pulmonary effort is normal.     Breath sounds: Normal breath sounds. No wheezing, rhonchi or rales.  Abdominal:     General: There is distension.     Palpations: Abdomen is soft.     Tenderness: There is no abdominal tenderness.  Musculoskeletal:     Right lower leg: Edema present.     Left lower leg: Edema present.     Comments: His right shoulder incisions have healed nicely.  He still has some pitting edema of his lower legs but this is much improved over the last week.  Skin:    Findings: No rash.     Comments: He has a little bit of redness around his PICC insertion site but no drainage.  Psychiatric:        Mood and Affect: Mood normal.        Problem List Items  Addressed This Visit      High   Bacteremia due to methicillin susceptible Staphylococcus aureus (MSSA)    He appears to be responding well to therapy for MSSA bacteremia.  I will need to see the results of his repeat echocardiogram to make sure that endocarditis was not missed when he was in the hospital to help determine optimal duration of IV cefazolin therapy.  He will follow-up here on 12/23/2020.      Rotator cuff arthropathy, right    There is no evidence that his staph bacteremia is secondary to any shoulder infection.        Medium   Volume overload    He is currently being evaluated for CHF.  Results of repeat echocardiogram are pending.          Michel Bickers, MD [facility] [phone] pager   [phone] cell 12/11/2020, 9:04 AM

## 2020-12-11 NOTE — Progress Notes (Signed)
No answer left a vm to call back

## 2020-12-11 NOTE — Assessment & Plan Note (Signed)
There is no evidence that his staph bacteremia is secondary to any shoulder infection.

## 2020-12-11 NOTE — Progress Notes (Addendum)
Called advance and spoke Amy and gave verbal order to extend IV antibiotics through 12/25/20 per Dr. Megan Salon. Verbal order read back and understood. [name]

## 2020-12-12 ENCOUNTER — Other Ambulatory Visit: Payer: Self-pay

## 2020-12-12 ENCOUNTER — Ambulatory Visit: Payer: BC Managed Care – PPO | Admitting: Physical Therapy

## 2020-12-12 DIAGNOSIS — I5031 Acute diastolic (congestive) heart failure: Secondary | ICD-10-CM

## 2020-12-12 NOTE — Progress Notes (Signed)
Patient is aware 

## 2020-12-15 ENCOUNTER — Ambulatory Visit: Payer: BC Managed Care – PPO

## 2020-12-15 ENCOUNTER — Other Ambulatory Visit: Payer: Self-pay

## 2020-12-15 DIAGNOSIS — I5031 Acute diastolic (congestive) heart failure: Secondary | ICD-10-CM

## 2020-12-16 ENCOUNTER — Ambulatory Visit: Payer: BC Managed Care – PPO

## 2020-12-16 DIAGNOSIS — G8929 Other chronic pain: Secondary | ICD-10-CM

## 2020-12-16 DIAGNOSIS — M25612 Stiffness of left shoulder, not elsewhere classified: Secondary | ICD-10-CM

## 2020-12-16 DIAGNOSIS — M6281 Muscle weakness (generalized): Secondary | ICD-10-CM

## 2020-12-16 DIAGNOSIS — M25611 Stiffness of right shoulder, not elsewhere classified: Secondary | ICD-10-CM

## 2020-12-16 DIAGNOSIS — R600 Localized edema: Secondary | ICD-10-CM

## 2020-12-16 DIAGNOSIS — M25511 Pain in right shoulder: Secondary | ICD-10-CM

## 2020-12-16 LAB — BASIC METABOLIC PANEL
BUN/Creatinine Ratio: 8 — ABNORMAL LOW (ref 10–24)
BUN: 18 mg/dL (ref 8–27)
CO2: 27 mmol/L (ref 20–29)
Calcium: 9.4 mg/dL (ref 8.6–10.2)
Chloride: 102 mmol/L (ref 96–106)
Creatinine, Ser: 2.12 mg/dL — ABNORMAL HIGH (ref 0.76–1.27)
Glucose: 108 mg/dL — ABNORMAL HIGH (ref 65–99)
Potassium: 4 mmol/L (ref 3.5–5.2)
Sodium: 147 mmol/L — ABNORMAL HIGH (ref 134–144)
eGFR: 34 mL/min/{1.73_m2} — ABNORMAL LOW (ref >59)

## 2020-12-16 LAB — MAGNESIUM: Magnesium: 2 mg/dL (ref 1.6–2.3)

## 2020-12-16 LAB — PRO B NATRIURETIC PEPTIDE: NT-Pro BNP: 4473 pg/mL — ABNORMAL HIGH (ref 0–210)

## 2020-12-16 NOTE — Therapy (Signed)
[facility] [place], [name], [address] Phone: [phone]   Fax:  [phone]  Physical Therapy Treatment  Patient Details  Name: [name] MRN: [id number] Date of Birth: 1955/09/25 Referring Provider (PT): Ninfa Linden   Encounter Date: 12/16/2020   PT End of Session - 12/16/20 0818    Visit Number 5    Number of Visits 17    Date for PT Re-Evaluation 01/12/21    PT Start Time 0813    PT Stop Time 0845    PT Time Calculation (min) 32 min    Activity Tolerance Patient tolerated treatment well    Behavior During Therapy Flat affect           Past Medical History:  Diagnosis Date  . Back pain   . CKD (chronic kidney disease), stage III ([place])   . CVA (cerebral infarction)   . Diastolic dysfunction   . ED (erectile dysfunction)   . GERD (gastroesophageal reflux disease)   . Hypercalcemia   . Hyperlipidemia   . Hypertension   . Insomnia   . Long-term use of high-risk medication   . Nephrocalcinosis   . Nephrolithiasis   . Obesity   . Osteopenia   . Pancreatitis   . PE (pulmonary embolism)   . Proteinuria   . Sarcoidosis   . Smoker     Past Surgical History:  Procedure Laterality Date  . ABDOMINAL EXPLORATION SURGERY    . BACK SURGERY    . CHOLECYSTECTOMY    . SHOULDER ARTHROSCOPY Right 10/30/2020   Procedure: ARTHROSCOPY SHOULDER WITH EXTENSIVE DEBRIDEMENT;  Surgeon: Mcarthur Rossetti, MD;  Location: [facility];  Service: Orthopedics;  Laterality: Right;  . SHOULDER SURGERY    . TRACHEOSTOMY     closed    There were no vitals filed for this visit.   Subjective Assessment - 12/16/20 0817    Subjective Doing okay - says he saw cardiologist last week and everything all right    Pertinent History Previous R shoulder surgery 3 yrs ago. Previous RTC surgery on L shoulder 4-5 yrs ago. HTN. Back surgery ~ 2 yrs ago (has had 3 or 4 ).    Patient Stated Goals to get mobility  back, decrease pain    Currently in Pain? Yes    Pain Score 5     Pain Location Shoulder    Pain Orientation Right    Pain Descriptors / Indicators Aching                  [facility] Adult PT Treatment/Exercise - 12/16/20 0001      Shoulder Exercises: Standing   External Rotation AAROM;Right;20 reps   cane   Internal Rotation AAROM;Both;20 reps   cane   Flexion AAROM;Both   2x 10 with cane   Extension AAROM;Both;20 reps   cane   Row Both;20 reps;Theraband   plus shld ext   Theraband Level (Shoulder Row) Level 1 (Yellow)    Other Standing Exercises 3# shld shruggs 2 sets 10   3# bicep curl 2 sets 10   Other Standing Exercises wall finger climbs forward flexion  5 x 2 RUE. RUE scaption x5 within comfortable range      Shoulder Exercises: ROM/Strengthening   UBE (Upper Arm Bike) L 1 2 min fwd/2 back Left UE to move RT - AA          Manual Therapy   Manual Therapy Passive ROM  Manual therapy comments Seated    Passive ROM RT shld all directions                    PT Short Term Goals - 11/28/20 0827      PT SHORT TERM GOAL #1   Title Independent with initial HEP    Status Achieved             PT Long Term Goals - 11/17/20 0809      PT LONG TERM GOAL #1   Title Independent with advanced HEP    Time 8    Period Weeks    Status New    Target Date 01/12/21      PT LONG TERM GOAL #2   Title Report </= 2/10 R shoulder pain with ADLs using dominant right side    Time 8    Period Weeks    Status New    Target Date 01/12/21      PT LONG TERM GOAL #3   Title R shoulder AROM symmetrical to left    Time 8    Period Weeks    Status New    Target Date 01/12/21      PT LONG TERM GOAL #4   Title BUE strength 4+/5    Time 8    Period Weeks    Status New    Target Date 01/12/21                 Plan - 12/16/20 0829    Clinical Impression Statement Arrived 13 minutes late to session today so treatment abbreviated. Continues to progress ROM and  strength within protocol for 7 weeks. Generally no c/o increased pain with exercises except for AA cane flexion, reports of some"tingling" by shoulder with rows that diminished with rests. Initiated some AA wlal walks on finger ladder with fair tolerance    Personal Factors and Comorbidities Past/Current Experience;Comorbidity 2;Fitness;Time since onset of injury/illness/exacerbation    Rehab Potential Good    PT Frequency 2x / week    PT Duration 8 weeks    PT Treatment/Interventions ADLs/Self Care Home Management;Electrical Stimulation;Cryotherapy;Moist Heat;Neuromuscular re-education;Therapeutic exercise;Therapeutic activities;Functional mobility training;Patient/family education;Manual techniques;Energy conservation;Taping;Vasopneumatic Device;Scar mobilization    PT Next Visit Plan Initiate gentle PROM/AAROM within tolerance, emphasis on within pain free ranges. Manual and mdalities as needed.    Consulted and Agree with Plan of Care Patient           Patient will benefit from skilled therapeutic intervention in order to improve the following deficits and impairments:  Decreased range of motion,Impaired UE functional use,Increased muscle spasms,Decreased endurance,Pain,Improper body mechanics,Impaired flexibility,Decreased mobility,Decreased strength,Increased edema  Visit Diagnosis: Muscle weakness (generalized)  Chronic right shoulder pain  Stiffness of right shoulder, not elsewhere classified  Localized edema  Stiffness of left shoulder, not elsewhere classified     Problem List Patient Active Problem List   Diagnosis Date Noted  . Rotator cuff arthropathy, right 12/04/2020  . Volume overload 12/04/2020  . Shortness of breath 12/04/2020  . ARF (acute renal failure) ([place]) 11/19/2020  . Acute respiratory failure ([place]) 11/19/2020  . Bacteremia due to methicillin susceptible Staphylococcus aureus (MSSA) 11/19/2020  . Cranial neuropathy   . Acute encephalopathy 11/18/2020   . Chronic right shoulder pain 06/18/2020  . OSA (obstructive sleep apnea) 05/05/2020  . Hypersomnia with sleep apnea 12/20/2019  . Psychophysiological insomnia 09/18/2019  . Nocturia more than twice per night 09/18/2019  . Renal hypertension 09/18/2019  . Moderate asthma  09/18/2019  . Tobacco abuse 09/18/2019  . Non-restorative sleep 09/18/2019  . Nontraumatic complete tear of right rotator cuff 08/15/2019  . Delirium 03/16/2019  . Stage 3b chronic kidney disease   . CVA (cerebral infarction)   . Diastolic dysfunction   . Pseudarthrosis 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 02/28/2019  . Intervertebral disc disorder with radiculopathy of lumbar region 09/13/2017  . Acute gouty arthritis 03/07/2017  . Arthralgia of left foot 03/07/2017  . Nephrolithiasis   . Fever, unspecified 11/02/2015  . Essential hypertension 02/07/2015  . Pulmonary embolism ([place]) 01/30/2013  . Sarcoidosis 01/30/2013  . Hemoptysis 01/30/2013  . SHOULDER PAIN, LEFT 11/28/2009    Hall Busing, PT, DPT 12/16/2020, 9:39 AM  [facility]. [place], [name], [address] Phone: [phone]   Fax:  [phone]  Name: BENITO LEMMERMAN MRN: [id number] Date of Birth: 10-18-1955

## 2020-12-17 NOTE — Progress Notes (Signed)
Date:  12/18/2020   ID:  [name], DOB 31-Jan-1956, MRN [id number]  PCP:  Manuel Baton, MD  Cardiologist: Manuel Kras, DO, [facility] (established care 12/10/2020)  Date: 12/18/20 Last Office Visit: 12/10/2020  Chief Complaint  Patient presents with  . Acute heart failure with preserved ejection fraction   . Follow-up    1 week    HPI  [name] is a 65 y.o. male who presents to the office with a chief complaint of " heart failure management." Patient's past medical history and cardiovascular risk factors include: Sarcoidosis, HTN, Tobacco abuse, Hx of PE (was on Xarelto for 1 year), OSA not on CPAP, MSSA Bacteremia (March 2022), Hx of cardiac arrest (2002 during his hospitalization for pancreatitis per wife).  He is referred to the office at the request of Manuel Baton, MD for evaluation of heart failure.  Patient is accompanied by his wife [name]) at today's office visit.  Patient was recently hospitalized for an elective right shoulder repair surgery in February 2022 and did well.  However in March 2022 he had an episode of confusion and altered mental status for which he went to ED and was hospitalized.  He was diagnosed with MSSA bacteremia and is currently on IV antibiotics via PICC line until next wednesday.  Since discharge from the hospitalization patient was experiencing shortness of breath at rest and with effort related activities, lower extremity swelling right greater than left, orthopnea, paroxysmal nocturnal dyspnea.  At the last office visit I was concerned that he may have underlying DVT and he was sent to [facility] imaging for a stat lower extremity duplex which was negative.  Thereafter he was started on Bumex 1 mg p.o. twice daily but he is only taking it once a day.  He also is did not take losartan as prescribed until this past Sunday.  Most recent blood work after being on Bumex for 1 week reviewed with him in great detail.  Patient is informed that I am  concerned about the rising creatinine but it may be secondary to intravascular depletion due to diuresis.  He is also getting IV antibiotics through his PICC line which may be contributing.  Since diuresis patient states that he is less short of breath, denies any chest pain, no lower extremity swelling has improved.  He was also started on BiDil which has improved his blood pressures since last office visit.  FUNCTIONAL STATUS: No structured exercise program or daily routine.   ALLERGIES: Allergies  Allergen Reactions  . Codeine Nausea Only  . Hydromorphone Other (See Comments)    hallucinations     MEDICATION LIST PRIOR TO VISIT: Current Meds  Medication Sig  . albuterol (PROVENTIL HFA) 108 (90 Base) MCG/ACT inhaler Inhale 2 puffs into the lungs every 4 (four) hours as needed for wheezing or shortness of breath. Needs office visit for any refills.  Marland Kitchen aspirin 81 MG tablet Take 1 tablet (81 mg total) by mouth daily.  Marland Kitchen atorvastatin (LIPITOR) 20 MG tablet Take 20 mg by mouth daily.  Marland Kitchen BLACK ELDERBERRY PO Take 2 each by mouth daily as needed (immune support). Gummies  . ceFAZolin (ANCEF) IVPB Inject 2 g into the vein every 8 (eight) hours for 26 days. Indication: MSSA Bacteremia First Dose: Yes Last Day of Therapy:  12/17/2020 Labs - Once weekly:  CBC/D and BMP, Labs - Every other week:  ESR and CRP Method of administration: IV Push Method of administration may be changed at the discretion  of home infusion pharmacist based upon assessment of the patient and/or caregiver's ability to self-administer the medication ordered.  . diltiazem (CARDIZEM CD) 360 MG 24 hr capsule Take 360 mg by mouth daily.  . fluticasone (FLONASE) 50 MCG/ACT nasal spray Place 2 sprays into both nostrils daily as needed for allergies or rhinitis.  Marland Kitchen KLOR-CON M20 20 MEQ tablet Take 20 mEq by mouth daily.  Marland Kitchen labetalol (NORMODYNE) 300 MG tablet Take 300 mg by mouth 3 (three) times daily.  Marland Kitchen MELATONIN PO Take 12.5  mg by mouth at bedtime as needed (sleep).  Marland Kitchen oxyCODONE (ROXICODONE) 5 MG immediate release tablet Take 1-2 tablets (5-10 mg total) by mouth every 6 (six) hours as needed for severe pain.  . predniSONE (DELTASONE) 5 MG tablet Take 5 mg by mouth daily. Reported on 11/02/2015  . tamsulosin (FLOMAX) 0.4 MG CAPS capsule Take 0.4 mg by mouth daily.  . traZODone (DESYREL) 150 MG tablet Take 150 mg by mouth at bedtime as needed for sleep.  . [DISCONTINUED] bumetanide (BUMEX) 1 MG tablet Take 1 tablet (1 mg total) by mouth 2 (two) times daily for 14 days.  . [DISCONTINUED] isosorbide-hydrALAZINE (BIDIL) 20-37.5 MG tablet Take 1 tablet by mouth 3 (three) times daily.  . [DISCONTINUED] losartan (COZAAR) 100 MG tablet Take 100 mg by mouth daily.     PAST MEDICAL HISTORY: Past Medical History:  Diagnosis Date  . Back pain   . CKD (chronic kidney disease), stage III ([place])   . CVA (cerebral infarction)   . Diastolic dysfunction   . ED (erectile dysfunction)   . GERD (gastroesophageal reflux disease)   . Hypercalcemia   . Hyperlipidemia   . Hypertension   . Insomnia   . Long-term use of high-risk medication   . Nephrocalcinosis   . Nephrolithiasis   . Obesity   . Osteopenia   . Pancreatitis   . PE (pulmonary embolism)   . Proteinuria   . Sarcoidosis   . Smoker   Patient and wife denies hx of CVA.   PAST SURGICAL HISTORY: Past Surgical History:  Procedure Laterality Date  . ABDOMINAL EXPLORATION SURGERY    . BACK SURGERY    . CHOLECYSTECTOMY    . SHOULDER ARTHROSCOPY Right 10/30/2020   Procedure: ARTHROSCOPY SHOULDER WITH EXTENSIVE DEBRIDEMENT;  Surgeon: Mcarthur Rossetti, MD;  Location: [place];  Service: Orthopedics;  Laterality: Right;  . SHOULDER SURGERY    . TRACHEOSTOMY     closed    FAMILY HISTORY: The patient family history includes Alzheimer's disease in his mother; CVA in his father; Heart failure in his sister; Hypertension in his father and sister;  Lung cancer in his father.  SOCIAL HISTORY:  The patient  reports that he has been smoking cigarettes. He started smoking about 3 weeks ago. He has been smoking about 0.25 packs per day. He has never used smokeless tobacco. He reports that he does not drink alcohol and does not use drugs.  REVIEW OF SYSTEMS: Review of Systems  Constitutional: Positive for malaise/fatigue and weight gain. Negative for chills and fever.  HENT: Negative for hoarse voice and nosebleeds.   Eyes: Negative for discharge, double vision and pain.  Cardiovascular: Positive for dyspnea on exertion (improving), leg swelling (improving), orthopnea (2 pillow) and paroxysmal nocturnal dyspnea. Negative for chest pain, claudication, near-syncope, palpitations and syncope.  Respiratory: Positive for shortness of breath. Negative for hemoptysis.   Musculoskeletal: Negative for muscle cramps and myalgias.  Gastrointestinal: Negative for abdominal pain,  constipation, diarrhea, hematemesis, hematochezia, melena, nausea and vomiting.  Neurological: Negative for dizziness and light-headedness.    PHYSICAL EXAM: Vitals with BMI 12/18/2020 12/11/2020 12/10/2020  Height _0  _1  -  Weight 246 lbs 3 oz 240 lbs -  BMI 88.41 66.06 -  Systolic 301 601 093  Diastolic 84 92 93  Pulse 61 92 65    CONSTITUTIONAL: Appears older than stated age, slightly somnolent, well-developed and well-nourished. No acute distress.  SKIN: Skin is warm and dry. No rash noted. No cyanosis. No pallor. No jaundice HEAD: Normocephalic and atraumatic.  EYES: No scleral icterus MOUTH/THROAT: Moist oral membranes.  NECK: No JVD present. No thyromegaly noted. No carotid bruits  LYMPHATIC: No visible cervical adenopathy.  CHEST Normal respiratory effort. No intercostal retractions  LUNGS: Clear to auscultation bilaterally.  No stridor. No wheezes. No rales.  CARDIOVASCULAR: Regular, positive A3-F5, soft holosystolic murmur heard at the apex, no gallops or  rubs. ABDOMINAL: Obese, soft soft, nontender, distended, positive bowel sounds in all 4 quadrants, no apparent ascites.  EXTREMITIES: Bilateral lower extremity swelling right greater than left, +1 pitting edema bilaterally, no open wounds or ulcers.  PICC line noted in the right antecubital fossa HEMATOLOGIC: No significant bruising NEUROLOGIC: Oriented to person, place, and time. Nonfocal. Normal muscle tone.  PSYCHIATRIC: Normal mood and affect. Normal behavior. Cooperative  CARDIAC DATABASE: EKG: 12/18/2020: Sinus bradycardia, 58 bpm, left atrial enlargement, diffuse T wave inversions.  No significant change compared to prior ECG.  Echocardiogram: 12/15/2020:  Normal LV systolic function with visual EF 55-60%.  Left ventricle cavity is normal in size.  Moderate left ventricular hypertrophy. Normal global wall motion.  Indeterminate diastolic filling pattern, normal LAP.  Left atrial cavity is mildly dilated.  Mild (Grade I) aortic regurgitation.  Mild (Grade I) mitral regurgitation.  Mild tricuspid regurgitation. No evidence of pulmonary hypertension.  IVC is dilated with a respiratory response of >50%.  Prior study 11/19/2020: LVEF 60-65%, Grade I DD, Mild LAE, Trivial MR, RAP 38mHg.  Stress Testing: No results found for this or any previous visit from the past 1095 days.   Heart Catheterization: None  LABORATORY DATA: CBC Latest Ref Rng & Units 11/21/2020 11/20/2020 11/19/2020  WBC 4.0 - 10.5 K/uL 6.4 6.4 7.0  Hemoglobin 13.0 - 17.0 g/dL 12.0(L) 11.9(L) 12.4(L)  Hematocrit 39.0 - 52.0 % 36.2(L) 35.5(L) 39.4  Platelets 150 - 400 K/uL 204 200 208    CMP Latest Ref Rng & Units 12/15/2020 11/22/2020 11/21/2020  Glucose 65 - 99 mg/dL 108(H) 87 97  BUN 8 - 27 mg/dL 18 8 6(L)  Creatinine 0.76 - 1.27 mg/dL 2.12(H) 1.53(H) 1.66(H)  Sodium 134 - 144 mmol/L 147(H) 138 140  Potassium 3.5 - 5.2 mmol/L 4.0 3.1(L) 3.2(L)  Chloride 96 - 106 mmol/L 102 102 103  CO2 20 - 29 mmol/L _2 Calcium 8.6 - 10.2 mg/dL 9.4 8.6(L) 8.8(L)  Total Protein 6.5 - 8.1 g/dL - 6.0(L) -  Total Bilirubin 0.3 - 1.2 mg/dL - 1.0 -  Alkaline Phos 38 - 126 U/L - 81 -  AST 15 - 41 U/L - 23 -  ALT 0 - 44 U/L - 36 -    Lipid Panel     Component Value Date/Time   CHOL 123 03/15/2019 0425   TRIG 130 03/15/2019 0425   HDL 32 (L) 03/15/2019 0425   CHOLHDL 3.8 03/15/2019 0425   VLDL 26 03/15/2019 0425   LDLCALC 65 03/15/2019 0425    No  components found for: NTPROBNP Recent Labs    12/15/20 0909  PROBNP 4,473*   Recent Labs    11/19/20 0233  TSH 1.247    BMP Recent Labs    11/20/20 0706 11/21/20 0302 11/22/20 0140 12/15/20 0909  NA 142 140 138 147*  K 4.2 3.2* 3.1* 4.0  CL 103 103 102 102  CO2 _0 GLUCOSE 110* 97 87 108*  BUN 14 6* 8 18  CREATININE 2.02* 1.66* 1.53* 2.12*  CALCIUM 8.6* 8.8* 8.6* 9.4  GFRNONAA 36* 46* 51*  --     HEMOGLOBIN A1C Lab Results  Component Value Date   HGBA1C 5.8 (H) 03/15/2019   MPG 120 03/15/2019    External Labs: Collected: 12/08/2020 Creatinine 1.7 mg/dL. (Serum creatinine 1.6 mg/dL collected 07/11/2020) eGFR: 40.9 mL/min per 1.73 m Sodium 143, potassium 3.5, chloride 104, bicarb 32 Hemoglobin 12.6, hematocrit 38.2 NT proBNP 5576  IMPRESSION:    ICD-10-CM   1. Acute heart failure with preserved ejection fraction (HFpEF) (HCC)  I50.31 EKG 12-Lead    bumetanide (BUMEX) 1 MG tablet    isosorbide-hydrALAZINE (BIDIL) 20-37.5 MG tablet    Basic metabolic panel    Pro b natriuretic peptide (BNP)    Magnesium  2. AKI (acute kidney injury) ([place])  N17.9   3. Leg swelling  M79.89   4. Essential hypertension  I10   5. Bacteremia due to methicillin susceptible Staphylococcus aureus (MSSA)  R78.81    B95.61   6. Mixed hyperlipidemia  E78.2   7. Dyspnea on exertion  R06.00   8. Tobacco abuse  Z72.0   9. Hx pulmonary embolism  Z86.711   10. OSA (obstructive sleep apnea)  G47.33   11. Hx of cardiac arrest  Z86.74       RECOMMENDATIONS: SANJITH SIWEK is a 65 y.o. male whose past medical history and cardiac risk factors include: Sarcoidosis, HTN, Tobacco abuse, Hx of PE (was on Xarelto for 1 year), OSA not on CPAP, MSSA Bacteremia (March 2022), Hx of cardiac arrest (2002 during his hospitalization for pancreatitis per wife).  Acute heart failure with preserved EF:  Clinically patient appears to be less short of breath and less swelling bilaterally but his weight has gone up by 2 pounds.  Most recent lab work also reviewed with the patient and his wife at today's office visit.  I did voice my concern with regards to the rise in serum creatinine which is often seen with diuresis but needs to be monitored closely.  Discontinue losartan for now.  Increase BiDil to 2 tablets 3 times a day.  I have asked him to start taking Bumex 1 mg twice a day starting Sunday, December 21, 2020 and lab work in 1 week.  Strict I's and O's and daily weights.  Low-salt diet recommended.  No more than 1500 mg of liquid intake.    NT proBNP improving  If he has worsening orthopnea, paroxysmal nocturnal dyspnea or lower extremity swelling he is advised to go to the hospital for further evaluation and management.    Acute kidney injury:  Most likely secondary to intravascular depletion and diuresis induced.  Baseline serum creatinine most likely around 1.6-1.7-1.7 mg/dL  Encouraged low-salt diet.  Encouraged intake of healthy protein to help increase the albumin/prealbumin to help improve the oncotic pressure to prevent third spacing.  Avoid nephrotoxic agents.  Discontinue the losartan for now.  Avoid NSAIDs Motrins Naprosyn ibuprofen.  Lower extremity swelling: Improving  Lower extremity duplex  negative for DVT.  Patient is encouraged to use compression stockings as directed for 8 hours a day if possible.  Elevate the legs.  Low-salt diet recommended.  Patient was only taking Bumex 1 mg once a day will uptitrate  in the next several days after he discontinues losartan to help mitigate risk of worsening AKI   Benign essential hypertension:  Office blood pressure is very well controlled.    Medications reconciled.  Discontinue losartan due to acute kidney injury.  Increase BiDil to 2 tablets 3 times a day.    Low-salt diet recommended.  We will monitor.  MSSA bacteremia: Currently following infectious disease and is on antibiotics via PICC line.  Will await transthoracic echo images but will consider possible TEE if needed.   Smoking: Tobacco cessation counseling: Currently smoking 1 cigarette/day Patient was informed of the dangers of tobacco abuse including stroke, cancer, and MI, as well as benefits of tobacco cessation. Patient is not willing to quit at this time. Approximately 5 mins were spent counseling patient cessation techniques. We discussed various methods to help quit smoking, including deciding on a date to quit, joining a support group, pharmacological agents- nicotine gum/patch/lozenges, chantix.  I will reassess his progress at the next follow-up visit  Patient will benefit from an ischemic evaluation in the near future; however, he currently does not have any chest pain and would like him to be euvolemic prior to additional testing.  I have clearly wasted my concern with regards to his cardiovascular care and outcome both the patient and his wife at today's office visit.  The importance of medication compliance, low-salt diet, follow-up blood work greatly encouraged.  FINAL MEDICATION LIST END OF ENCOUNTER: Meds ordered this encounter  Medications  . bumetanide (BUMEX) 1 MG tablet    Sig: Take 1 tablet (1 mg total) by mouth 2 (two) times daily.    Dispense:  60 tablet    Refill:  0  . isosorbide-hydrALAZINE (BIDIL) 20-37.5 MG tablet    Sig: Take 2 tablets by mouth 3 (three) times daily.    Dispense:  180 tablet    Refill:  0    Medications Discontinued During This  Encounter  Medication Reason  . ondansetron (ZOFRAN ODT) 4 MG disintegrating tablet Error  . losartan (COZAAR) 100 MG tablet Discontinued by provider  . isosorbide-hydrALAZINE (BIDIL) 20-37.5 MG tablet Reorder  . bumetanide (BUMEX) 1 MG tablet Reorder     Current Outpatient Medications:  .  albuterol (PROVENTIL HFA) 108 (90 Base) MCG/ACT inhaler, Inhale 2 puffs into the lungs every 4 (four) hours as needed for wheezing or shortness of breath. Needs office visit for any refills., Disp: 1 Inhaler, Rfl: 0 .  aspirin 81 MG tablet, Take 1 tablet (81 mg total) by mouth daily., Disp: , Rfl:  .  atorvastatin (LIPITOR) 20 MG tablet, Take 20 mg by mouth daily., Disp: , Rfl:  .  BLACK ELDERBERRY PO, Take 2 each by mouth daily as needed (immune support). Gummies, Disp: , Rfl:  .  ceFAZolin (ANCEF) IVPB, Inject 2 g into the vein every 8 (eight) hours for 26 days. Indication: MSSA Bacteremia First Dose: Yes Last Day of Therapy:  12/17/2020 Labs - Once weekly:  CBC/D and BMP, Labs - Every other week:  ESR and CRP Method of administration: IV Push Method of administration may be changed at the discretion of home infusion pharmacist based upon assessment of the patient and/or caregiver's ability to self-administer the medication ordered., Disp: 78 Units,  Rfl: 0 .  diltiazem (CARDIZEM CD) 360 MG 24 hr capsule, Take 360 mg by mouth daily., Disp: , Rfl:  .  fluticasone (FLONASE) 50 MCG/ACT nasal spray, Place 2 sprays into both nostrils daily as needed for allergies or rhinitis., Disp: , Rfl:  .  KLOR-CON M20 20 MEQ tablet, Take 20 mEq by mouth daily., Disp: , Rfl:  .  labetalol (NORMODYNE) 300 MG tablet, Take 300 mg by mouth 3 (three) times daily., Disp: , Rfl:  .  MELATONIN PO, Take 12.5 mg by mouth at bedtime as needed (sleep)., Disp: , Rfl:  .  oxyCODONE (ROXICODONE) 5 MG immediate release tablet, Take 1-2 tablets (5-10 mg total) by mouth every 6 (six) hours as needed for severe pain., Disp: 30 tablet, Rfl: 0 .   predniSONE (DELTASONE) 5 MG tablet, Take 5 mg by mouth daily. Reported on 11/02/2015, Disp: , Rfl:  .  tamsulosin (FLOMAX) 0.4 MG CAPS capsule, Take 0.4 mg by mouth daily., Disp: , Rfl:  .  traZODone (DESYREL) 150 MG tablet, Take 150 mg by mouth at bedtime as needed for sleep., Disp: , Rfl:  .  bumetanide (BUMEX) 1 MG tablet, Take 1 tablet (1 mg total) by mouth 2 (two) times daily., Disp: 60 tablet, Rfl: 0 .  isosorbide-hydrALAZINE (BIDIL) 20-37.5 MG tablet, Take 2 tablets by mouth 3 (three) times daily., Disp: 180 tablet, Rfl: 0  Orders Placed This Encounter  Procedures  . Basic metabolic panel  . Pro b natriuretic peptide (BNP)  . Magnesium  . EKG 12-Lead    There are no Patient Instructions on file for this visit.  --Continue cardiac medications as reconciled in final medication list. --No follow-ups on file. Or sooner if needed. --Continue follow-up with your primary care physician regarding the management of your other chronic comorbid conditions.  Patient's questions and concerns were addressed to his satisfaction. He voices understanding of the instructions provided during this encounter.   This note was created using a voice recognition software as a result there may be grammatical errors inadvertently enclosed that do not reflect the nature of this encounter. Every attempt is made to correct such errors.  [name], [place], [facility]  Pager: [phone] Office: [phone]

## 2020-12-18 ENCOUNTER — Ambulatory Visit: Payer: BC Managed Care – PPO | Admitting: Cardiology

## 2020-12-18 ENCOUNTER — Encounter: Payer: Self-pay | Admitting: Cardiology

## 2020-12-18 ENCOUNTER — Other Ambulatory Visit: Payer: Self-pay

## 2020-12-18 ENCOUNTER — Ambulatory Visit: Payer: BC Managed Care – PPO | Admitting: Physical Therapy

## 2020-12-18 VITALS — BP 136/84 | HR 61 | Temp 98.5°F | Resp 16 | Ht 72.0 in | Wt 246.2 lb

## 2020-12-18 DIAGNOSIS — G8929 Other chronic pain: Secondary | ICD-10-CM

## 2020-12-18 DIAGNOSIS — M7989 Other specified soft tissue disorders: Secondary | ICD-10-CM

## 2020-12-18 DIAGNOSIS — I10 Essential (primary) hypertension: Secondary | ICD-10-CM

## 2020-12-18 DIAGNOSIS — Z8674 Personal history of sudden cardiac arrest: Secondary | ICD-10-CM

## 2020-12-18 DIAGNOSIS — G4733 Obstructive sleep apnea (adult) (pediatric): Secondary | ICD-10-CM

## 2020-12-18 DIAGNOSIS — Z86711 Personal history of pulmonary embolism: Secondary | ICD-10-CM

## 2020-12-18 DIAGNOSIS — B9561 Methicillin susceptible Staphylococcus aureus infection as the cause of diseases classified elsewhere: Secondary | ICD-10-CM

## 2020-12-18 DIAGNOSIS — M25611 Stiffness of right shoulder, not elsewhere classified: Secondary | ICD-10-CM

## 2020-12-18 DIAGNOSIS — R0609 Other forms of dyspnea: Secondary | ICD-10-CM

## 2020-12-18 DIAGNOSIS — R0600 Dyspnea, unspecified: Secondary | ICD-10-CM

## 2020-12-18 DIAGNOSIS — M6281 Muscle weakness (generalized): Secondary | ICD-10-CM | POA: Diagnosis not present

## 2020-12-18 DIAGNOSIS — I5031 Acute diastolic (congestive) heart failure: Secondary | ICD-10-CM

## 2020-12-18 DIAGNOSIS — Z720 Tobacco use: Secondary | ICD-10-CM

## 2020-12-18 DIAGNOSIS — N179 Acute kidney failure, unspecified: Secondary | ICD-10-CM

## 2020-12-18 DIAGNOSIS — R7881 Bacteremia: Secondary | ICD-10-CM

## 2020-12-18 DIAGNOSIS — M25511 Pain in right shoulder: Secondary | ICD-10-CM

## 2020-12-18 DIAGNOSIS — E782 Mixed hyperlipidemia: Secondary | ICD-10-CM

## 2020-12-18 MED ORDER — BUMETANIDE 1 MG PO TABS: 1.0000 mg | ORAL_TABLET | Freq: Two times a day (BID) | ORAL | 0 refills | Status: DC

## 2020-12-18 MED ORDER — BIDIL 20-37.5 MG PO TABS
2.0000 | ORAL_TABLET | Freq: Three times a day (TID) | ORAL | 0 refills | Status: DC
Start: 2020-12-18 — End: 2021-01-08

## 2020-12-18 NOTE — Therapy (Signed)
[facility]. [place], [name], [address] Phone: [phone]   Fax:  [phone]  Physical Therapy Treatment  Patient Details  Name: [name] MRN: [id number] Date of Birth: 11-Apr-1956 Referring Provider (PT): Hedy Camara Date: 12/18/2020   PT End of Session - 12/18/20 0818    Visit Number 6    Number of Visits 17    Date for PT Re-Evaluation 01/12/21    PT Start Time 1962    PT Stop Time 0811    PT Time Calculation (min) 16 min           Past Medical History:  Diagnosis Date  . Back pain   . CKD (chronic kidney disease), stage III ([place])   . CVA (cerebral infarction)   . Diastolic dysfunction   . ED (erectile dysfunction)   . GERD (gastroesophageal reflux disease)   . Hypercalcemia   . Hyperlipidemia   . Hypertension   . Insomnia   . Long-term use of high-risk medication   . Nephrocalcinosis   . Nephrolithiasis   . Obesity   . Osteopenia   . Pancreatitis   . PE (pulmonary embolism)   . Proteinuria   . Sarcoidosis   . Smoker     Past Surgical History:  Procedure Laterality Date  . ABDOMINAL EXPLORATION SURGERY    . BACK SURGERY    . CHOLECYSTECTOMY    . SHOULDER ARTHROSCOPY Right 10/30/2020   Procedure: ARTHROSCOPY SHOULDER WITH EXTENSIVE DEBRIDEMENT;  Surgeon: Mcarthur Rossetti, MD;  Location: [facility];  Service: Orthopedics;  Laterality: Right;  . SHOULDER SURGERY    . TRACHEOSTOMY     closed    There were no vitals filed for this visit.   Subjective Assessment - 12/18/20 0812    Subjective not feeling great- almost cancelled. slept on shld and more sore.    Currently in Pain? Yes    Pain Score 6     Pain Location Shoulder    Pain Orientation Right              [facility] PT Assessment - 12/18/20 0001      AROM   Overall AROM Comments Shoulder: Right Flex 170 A/ 180 P. RT  ABD 140 A/170 P. RT ER 90 A, IR 30 A 40 P Standing.                          [facility] Adult PT Treatment/Exercise - 12/18/20 0001      Manual Therapy   Manual Therapy Passive ROM    Manual therapy comments Seated    Passive ROM RT shld all directions                    PT Short Term Goals - 11/28/20 0827      PT SHORT TERM GOAL #1   Title Independent with initial HEP    Status Achieved             PT Long Term Goals - 12/18/20 0818      PT LONG TERM GOAL #1   Title Independent with advanced HEP    Status On-going      PT LONG TERM GOAL #2   Title Report </= 2/10 R shoulder pain with ADLs using dominant right side    Status On-going      PT LONG TERM GOAL #3   Title R shoulder  AROM symmetrical to left    Status Partially Met      PT LONG TERM GOAL #4   Title BUE strength 4+/5    Status On-going                 Plan - 12/18/20 0819    Clinical Impression Statement pt arrived stating he was increased pain , maybe from sleeping on shld. Verb he was not feeling weel. PTA noticed immediately pt had increased BIL swelling in neck and clavical area- pointed out to pt ( involved PT from last session and confirmed she did not notice last session) pt seeing cardio at 11 am so asked hom to address with MD. Decided to do only PROM this morning. Cued throughout to relax . Excellent increase in ROM standing, most limited in IR.    PT Treatment/Interventions ADLs/Self Care Home Management;Electrical Stimulation;Cryotherapy;Moist Heat;Neuromuscular re-education;Therapeutic exercise;Therapeutic activities;Functional mobility training;Patient/family education;Manual techniques;Energy conservation;Taping;Vasopneumatic Device;Scar mobilization    PT Next Visit Plan assess results of cardio appt and progress           Patient will benefit from skilled therapeutic intervention in order to improve the following deficits and impairments:  Decreased range of motion,Impaired UE functional use,Increased muscle  spasms,Decreased endurance,Pain,Improper body mechanics,Impaired flexibility,Decreased mobility,Decreased strength,Increased edema  Visit Diagnosis: Muscle weakness (generalized)  Chronic right shoulder pain  Stiffness of right shoulder, not elsewhere classified     Problem List Patient Active Problem List   Diagnosis Date Noted  . Rotator cuff arthropathy, right 12/04/2020  . Volume overload 12/04/2020  . Shortness of breath 12/04/2020  . ARF (acute renal failure) ([place]) 11/19/2020  . Acute respiratory failure ([place]) 11/19/2020  . Bacteremia due to methicillin susceptible Staphylococcus aureus (MSSA) 11/19/2020  . Cranial neuropathy   . Acute encephalopathy 11/18/2020  . Chronic right shoulder pain 06/18/2020  . OSA (obstructive sleep apnea) 05/05/2020  . Hypersomnia with sleep apnea 12/20/2019  . Psychophysiological insomnia 09/18/2019  . Nocturia more than twice per night 09/18/2019  . Renal hypertension 09/18/2019  . Moderate asthma 09/18/2019  . Tobacco abuse 09/18/2019  . Non-restorative sleep 09/18/2019  . Nontraumatic complete tear of right rotator cuff 08/15/2019  . Delirium 03/16/2019  . Stage 3b chronic kidney disease   . CVA (cerebral infarction)   . Diastolic dysfunction   . Pseudarthrosis 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 02/28/2019  . Intervertebral disc disorder with radiculopathy of lumbar region 09/13/2017  . Acute gouty arthritis 03/07/2017  . Arthralgia of left foot 03/07/2017  . Nephrolithiasis   . Fever, unspecified 11/02/2015  . Essential hypertension 02/07/2015  . Pulmonary embolism ([place]) 01/30/2013  . Sarcoidosis 01/30/2013  . Hemoptysis 01/30/2013  . SHOULDER PAIN, LEFT 11/28/2009    Drequan Ironside,ANGIE PTA 12/18/2020, 8:23 AM  [facility]. [place], [name], [address] Phone: [phone]   Fax:  [phone]  Name: [name] MRN: [id number] Date of Birth: October 31, 1955

## 2020-12-22 ENCOUNTER — Telehealth: Payer: Self-pay | Admitting: Neurology

## 2020-12-22 ENCOUNTER — Ambulatory Visit: Payer: BC Managed Care – PPO | Admitting: Neurology

## 2020-12-22 ENCOUNTER — Encounter: Payer: Self-pay | Admitting: Neurology

## 2020-12-22 VITALS — BP 143/80 | HR 67 | Ht 72.0 in | Wt 236.0 lb

## 2020-12-22 DIAGNOSIS — G3184 Mild cognitive impairment, so stated: Secondary | ICD-10-CM

## 2020-12-22 DIAGNOSIS — D869 Sarcoidosis, unspecified: Secondary | ICD-10-CM

## 2020-12-22 DIAGNOSIS — I5041 Acute combined systolic (congestive) and diastolic (congestive) heart failure: Secondary | ICD-10-CM | POA: Insufficient documentation

## 2020-12-22 DIAGNOSIS — N1832 Chronic kidney disease, stage 3b: Secondary | ICD-10-CM

## 2020-12-22 DIAGNOSIS — I2602 Saddle embolus of pulmonary artery with acute cor pulmonale: Secondary | ICD-10-CM

## 2020-12-22 DIAGNOSIS — I2692 Saddle embolus of pulmonary artery without acute cor pulmonale: Secondary | ICD-10-CM

## 2020-12-22 DIAGNOSIS — G9340 Encephalopathy, unspecified: Secondary | ICD-10-CM | POA: Diagnosis not present

## 2020-12-22 DIAGNOSIS — I6782 Cerebral ischemia: Secondary | ICD-10-CM | POA: Diagnosis not present

## 2020-12-22 DIAGNOSIS — G4734 Idiopathic sleep related nonobstructive alveolar hypoventilation: Secondary | ICD-10-CM | POA: Diagnosis not present

## 2020-12-22 DIAGNOSIS — G4733 Obstructive sleep apnea (adult) (pediatric): Secondary | ICD-10-CM

## 2020-12-22 DIAGNOSIS — I2782 Chronic pulmonary embolism: Secondary | ICD-10-CM

## 2020-12-22 HISTORY — DX: Acute combined systolic (congestive) and diastolic (congestive) heart failure: I50.41

## 2020-12-22 MED ORDER — TRAZODONE HCL 150 MG PO TABS: 150.0000 mg | ORAL_TABLET | Freq: Every evening | ORAL | 3 refills | Status: DC | PRN

## 2020-12-22 NOTE — Patient Instructions (Signed)
Mild Neurocognitive Disorder Mild neurocognitive disorder, formerly known as mild cognitive impairment, is a disorder in which memory does not work as well as it should. This disorder may also cause problems with other mental functions, including thought, communication, behavior, and completion of tasks. These problems can be noticed and measured, but they usually do not interfere with daily activities or the ability to live independently. Mild neurocognitive disorder typically develops after 64 years of age, but it can also develop at younger ages. It is not as serious as major neurocognitive disorder, also known as dementia, but it may be the first sign of it. Generally, symptoms of this condition get worse over time. In rare cases, symptoms can get better. What are the causes? This condition may be caused by:  Brain disorders like Alzheimer's disease, Parkinson's disease, and other conditions that gradually damage nerve cells (neurodegenerative conditions).  Diseases that affect blood vessels in the brain and result in small strokes.  Certain infections, such as HIV.  Traumatic brain injury.  Other medical conditions, such as brain tumors, underactive thyroid (hypothyroidism), and vitamin B12 deficiency.  Use of certain drugs or prescription medicines. What increases the risk? The following factors may make you more likely to develop this condition:  Being older than 65 years.  Being male.  Low education level.  Diabetes, high blood pressure, high cholesterol, and other conditions that increase the risk for blood vessel diseases.  Untreated or undertreated sleep apnea.  Having a certain type of gene that can be passed from parent to child (inherited).  Chronic health problems such as heart disease, lung disease, liver disease, kidney disease, or depression. What are the signs or symptoms? Symptoms of this condition include:  Difficulty remembering. You may: ? Forget names,  phone numbers, or details of recent events. ? Forget social events and appointments. ? Repeatedly forget where you put your car keys or other items.  Difficulty thinking and solving problems. You may have trouble with complex tasks, such as: ? Paying bills. ? Driving in unfamiliar places.  Difficulty communicating. You may have trouble: ? Finding the right word or naming an object. ? Forming a sentence that makes sense, or understanding what you read or hear.  Changes in your behavior or personality. When this happens, you may: ? Lose interest in the things that you used to enjoy. ? Withdraw from social situations. ? Get angry more easily than usual. ? Act before thinking. How is this diagnosed? This condition is diagnosed based on:  Your symptoms. Your health care provider may ask you and the people you spend time with, such as family and friends, about your symptoms.  Evaluation of mental functions (neuropsychological testing). Your health care provider may refer you to a neurologist or mental health specialist to evaluate your mental functions in detail. To identify the cause of your condition, your health care provider may:  Get a detailed medical history.  Ask about use of alcohol, drugs, and prescription medicines.  Do a physical exam.  Order blood tests and brain imaging exams. How is this treated? Mild neurocognitive disorder that is caused by medicine use, drug use, infection, or another medical condition may improve when the cause is treated, or when medicines or drugs are stopped. If this disorder has another cause, it generally does not improve and may get worse. In these cases, the goal of treatment is to help you manage the loss of mental function. Treatments in these cases include:  Medicine. Medicine mainly helps memory   and behavior symptoms.  Talk therapy. Talk therapy provides education, emotional support, memory aids, and other ways of making up for problems  with mental function.  Lifestyle changes, including: ? Getting regular exercise. ? Eating a healthy diet that includes omega-3 fatty acids. ? Challenging your thinking and memory skills. ? Having more social interaction. Follow these instructions at home: Eating and drinking  Drink enough fluid to keep your urine pale yellow.  Eat a healthy diet that includes omega-3 fatty acids. These can be found in: ? Fish. ? Nuts. ? Leafy vegetables. ? Vegetable oils.  If you drink alcohol: ? Limit how much you use to:  0-1 drink a day for women.  0-2 drinks a day for men. ? Be aware of how much alcohol is in your drink. In the [place] one drink equals one 12 oz bottle of beer (355 mL), one 5 oz glass of wine (148 mL), or one 1 oz glass of hard liquor (44 mL).   Lifestyle  Get regular exercise as told by your health care provider.  Do not use any products that contain nicotine or tobacco, such as cigarettes, e-cigarettes, and chewing tobacco. If you need help quitting, ask your health care provider.  Practice ways to manage stress. If you need help managing stress, ask your health care provider.  Continue to have social interaction.  Keep your mind active with stimulating activities you enjoy, such as reading or playing games.  Make sure to get quality sleep. Follow these tips: ? Avoid napping during the day. ? Keep your sleeping area dark and cool. ? Avoid exercising during the few hours before you go to bed. ? Avoid caffeine products in the evening.   General instructions  Take over-the-counter and prescription medicines only as told by your health care provider. Your health care provider may recommend that you avoid taking medicines that can affect thinking, such as pain medicines or sleep medicines.  Work with your health care provider to find out what you need help with and what your safety needs are.  Keep all follow-up visits. This is important. Where to find more  information  National Institute on Aging: www.nia.nih.gov Contact a health care provider if:  You have any new symptoms. Get help right away if:  You develop new confusion or your confusion gets worse.  You act in ways that place you or your family in danger. Summary  Mild neurocognitive disorder is a disorder in which memory does not work as well as it should.  Mild neurocognitive disorder can have many causes. It may be the first stage of dementia.  To manage your condition, get regular exercise, keep your mind active, get quality sleep, and eat a healthy diet. This information is not intended to replace advice given to you by your health care provider. Make sure you discuss any questions you have with your health care provider. Document Revised: 01/07/2020 Document Reviewed: 01/07/2020 Elsevier Patient Education  2021 [employer]  

## 2020-12-22 NOTE — Addendum Note (Signed)
Addended by: Larey Seat on: 12/22/2020 12:10 PM   Modules accepted: Orders

## 2020-12-22 NOTE — Progress Notes (Signed)
SLEEP MEDICINE CLINIC    Provider:  Larey Seat, MD  Primary Care Physician:  [name], MD [address] [name] [address]     Referring Provider: [name], Md [place],  [place] [address]   Dr. Terri Skains, DO, cardiology.  Dr. Posey Pronto, MD, Nephrology.          Chief Complaint according to patient   Patient presents with:    . New Patient (Initial Visit)     pt alone, rm 10. presents today this was initial CPAP follow up apt. pt states he decided not to pursue the CPAP.  pt here to discuss results and if there is alternative.      12-22-2020: RV; HISTORY OF PRESENT ILLNESS:  [name] is a 64 y.o. year old biracial male patient seen on 12/22/2020 , upon referral by HOSPITALIST. Chief concern according to patient :  I have the pleasure of meeting with [name] today who is an established sleep patient in my practice but today's visit was arranged by the hospitalist at [facility] after a stay in hospital between the 15th and 21 November 2020.  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  He was mildly hypoxic and his chest x-ray had demonstrated vascular congestion he was also febrile he had tested positive for COVID-19 in early February about 6 weeks earlier neurology was consulted to evaluate for encephalopathy and treated him empirically for meningitis encephalitis and LP was obtained that showed 1 red blood cell 1 white blood cells elevated glucose abnormal protein and MRI brain was unremarkable except for some small  vessel disease or white matter disease which can be age-related and certainly will affect a sarcoidosis patient more as well.  Blood cultures have grown in 1 of 4 specimens culture-+with Staph epidermidis which was suspected to be a contamination and also MSSA which was treated as a true infection.   To rule out a pulmonary embolism a VQ scan was a negative and empiric heparin was to discontinued the LP was not congested consistent with meningitis.  His mental status normalized with antibiotic therapy.  So the patient has a history of gram-positive bacteremia, or sarcoidosis which may not affect just the lung but also the brain, essential hypertension chronic kidney disease, tobacco abuse, obstructive sleep apnea in the past, currently not treated with CPAP.  Acute encephalopathy, acute renal failure, acute respiratory failure.   He was seen by his primary care physician Dr. [name], I noticed that he had been given oxycodone after his shoulder surgery which may also impair his mental status and cognition, he was given Zofran in case of nausea, his hypertension was treated with hydralazine labetalol losartan, he is on Lipitor T of thiazide and Flonase he took took an aspirin daily.  Xanax as needed and albuterol inhaler as needed he was every day on Deltasone 5 mg.  He is also on trazodone at night for sleep.  The last coated labs were normal he did not have white blood cell elevation, hemoglobin and hematocrit were  13/39.4 at 1 time he had low potassium now the last test was 4.2 chloride CO2 was 31 glucose had been variable but was not all fast tested.  His creatinine was 2.31 just 2 weeks ago.   Ammonia was normal one of his liver function test was severely elevated the other 1 was normal bilirubin was normal albumin was low which could be related to 3 days of nausea and not eating well.  He was also worked up as a code stroke because of his neuro deficit and expressive aphasia moderate patchy and  ill-defined hypoattenuation within the cerebral white matter chronic small vessel disease no hemorrhage no acute stroke was found.  CT angiogram without stenosis at least not with at this present hemorrhage significantly stenosis.  The carotid arteries were patent, the patient has recovered since he was treated with antibiotics and has felt better, I have not followed him for sleep apnea since April last year when he had a repeat home sleep test and had a strongly REM sleep dependent sleep apnea.  He was overall mild with only 15.5 apneas and hypopneas per hour of sleep.  Given that the patient was intermittently well treated with a narcotic for pain control after surgery it is very well positive that it induced additional respiratory dysfunction.       05-05-2020; I meeting today with [name], all who underwent a home sleep test on December 10, 2019 his past medical history as stated below, he had mild sleep apnea with an AHI of 15.5 but this was strongly REM sleep dependent REM apnea-hypopnea index rose to 37/h he was borderline tachycardia bradycardic there was no positional component there were 34 minutes of hypoxemia time noted 8% of the recorded sleep time.  Based on this data CPAP is actually the only treatment option hypoxemia cannot be corrected by a dental device or inspire device, neither REM dependent apnea.  The patient has decided not to pursue CPAP but there is really no alternative for this form of apnea.  He still has trouble to initiate sleep, but this is not related to an organic sleep disorder. Also home sleep test he fell asleep within 30 minutes.  He stayed asleep for much of the night there was 1 wakefulness around 1 AM and another 1 around 430 AM after which sleep became more fragmented.   HISTORY OF PRESENT ILLNESS:  [name] is a 65 y.o. year old biracial male patient seen on 12/22/2020 , upon referral by dr Virgina Jock. Chief concern according to patient :  " I don't  think I have sleep apnea" .   I have the pleasure of seeing [name] today, a right -handed male with a possible sleep disorder. He   has a past medical history of Back pain, CKD (chronic kidney disease), stage III,  Diastolic dysfunction, ED (erectile dysfunction), GERD (gastroesophageal reflux disease), Hypercalcemia, Hyperlipidemia, renal Hypertension,  Long-term use of high-risk medication- including opioids, postsurgical delirium,  Nephrocalcinosis-lithiasis, Obesity, Osteopenia, Pancreatitis, PE (pulmonary embolism), Proteinuria, Sarcoidosis, and former smoker.  Dr. 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  Severe gallstone pancreatitis in July 2002 laparoscopic cholecystectomy in December 2002 and a tracheostomy at the time.  He has a history of recurrent retroperitoneal abscesses, osteopenia epididymitis, he has microalbuminuria  proteinuria and CKD stage III.  His urologist is Dr. Ruby Cola, he had a kidney biopsy in June 2011.   Sleep relevant medical history: Nocturia/: 4-5 times, sleep walker in childhood, mother was too. Tonsillectomy at age 35, multiple  spine surgery, no deviated septum / no UPPP.    Family medical /sleep history: mother was sleep walking .    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   and lives in a household with 7 persons- with spouse, daughter and 4 grandchildren.  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 . Tobacco use- quit 10 years ago .  ETOH use : rare , Caffeine intake in form of Coffee(  Decaffeinated ) Soda( less than one a day ) Tea ( avoids ) or energy drinks. Regular exercise in form of walking    Hobbies : "  Grandchildren"   Sleep habits are as follows:  The patient's dinner time is between 6 PM. The patient goes to bed at 10 PM where he falls asleep at about 12 midnight. He watches TV some evenings in the bedroom.   Once asleep he continues to sleep for 2 hours, wakes for 4-5 bathroom breaks, the first time at 2 AM.   The preferred sleep position is supine , with the support of 2 pillows.  Dreams are reportedly infrequent.  5-6 AM is the usual rise time. The patient wakes up spontaneously. He reports not feeling refreshed or restored in AM, with symptoms such as dry mouth , but no morning headaches, mainly residual fatigue.  Naps are taken infrequently, lasting from 30 minutes and are more refreshing than nocturnal sleep.    Review of Systems: Out of a complete 14 system review, the patient complains of only the following symptoms, and all other reviewed systems are negative.:  Fatigue, sleepiness , snoring, fragmented sleep, Insomnia , sleep hygiene -    How likely are you to doze in the following situations: 0 = not likely, 1 = slight chance, 2 = moderate chance, 3 = high chance   Sitting and Reading? Watching Television? Sitting inactive in a public place (theater or meeting)? As a passenger in a car for an hour without a break? Lying down in the afternoon when circumstances permit? Sitting and talking to someone? Sitting quietly after lunch without alcohol? In a car, while stopped for a few minutes in traffic?   Total =2 / 24 points   FSS endorsed at 36/ 63 points.  Higher than before.   he can't stand using CPAP-   Inspire was not recommended as first line treatment because of REM dependent. He never has had a CPAP titration, he didn't follow up.   Social History   Socioeconomic History  . Marital status: Married    Spouse name: Not on file  . Number of children: Not on file  . Years of education: Not on file  . Highest education level: Not on file  Occupational History   . Not on file  Tobacco Use  . Smoking status: Current Some Day Smoker    Packs/day: 0.25    Types: Cigarettes    Start date: 11/27/2020  . Smokeless tobacco: Never Used  Vaping Use  . Vaping Use: Never used  Substance and Sexual Activity  . Alcohol use: No    Alcohol/week: 0.0 standard drinks  . Drug use: No  . Sexual  activity: Yes  Other Topics Concern  . Not on file  Social History Narrative  . Not on file   Social Determinants of Health   Financial Resource Strain: Not on file  Food Insecurity: Not on file  Transportation Needs: Not on file  Physical Activity: Not on file  Stress: Not on file  Social Connections: Not on file    Family History  Problem Relation Age of Onset  . Hypertension Father   . CVA Father   . Lung cancer Father   . Alzheimer's disease Mother   . Hypertension Sister   . Heart failure Sister     Past Medical History:  Diagnosis Date  . Back pain   . CKD (chronic kidney disease), stage III ([place])   . CVA (cerebral infarction)   . Diastolic dysfunction   . ED (erectile dysfunction)   . GERD (gastroesophageal reflux disease)   . Hypercalcemia   . Hyperlipidemia   . Hypertension   . Insomnia   . Long-term use of high-risk medication   . Nephrocalcinosis   . Nephrolithiasis   . Obesity   . Osteopenia   . Pancreatitis   . PE (pulmonary embolism)   . Proteinuria   . Sarcoidosis   . Smoker     Past Surgical History:  Procedure Laterality Date  . ABDOMINAL EXPLORATION SURGERY    . BACK SURGERY    . CHOLECYSTECTOMY    . SHOULDER ARTHROSCOPY Right 10/30/2020   Procedure: ARTHROSCOPY SHOULDER WITH EXTENSIVE DEBRIDEMENT;  Surgeon: Mcarthur Rossetti, MD;  Location: [facility];  Service: Orthopedics;  Laterality: Right;  . SHOULDER SURGERY    . TRACHEOSTOMY     closed     Current Outpatient Medications on File Prior to Visit  Medication Sig Dispense Refill  . albuterol (PROVENTIL HFA) 108 (90 Base) MCG/ACT  inhaler Inhale 2 puffs into the lungs every 4 (four) hours as needed for wheezing or shortness of breath. Needs office visit for any refills. 1 Inhaler 0  . aspirin 81 MG tablet Take 1 tablet (81 mg total) by mouth daily.    Marland Kitchen atorvastatin (LIPITOR) 20 MG tablet Take 20 mg by mouth daily.    Marland Kitchen BLACK ELDERBERRY PO Take 2 each by mouth daily as needed (immune support). Gummies    . bumetanide (BUMEX) 1 MG tablet Take 1 tablet (1 mg total) by mouth 2 (two) times daily. 60 tablet 0  . diltiazem (CARDIZEM CD) 360 MG 24 hr capsule Take 360 mg by mouth daily.    . fluticasone (FLONASE) 50 MCG/ACT nasal spray Place 2 sprays into both nostrils daily as needed for allergies or rhinitis.    Marland Kitchen isosorbide-hydrALAZINE (BIDIL) 20-37.5 MG tablet Take 2 tablets by mouth 3 (three) times daily. 180 tablet 0  . KLOR-CON M20 20 MEQ tablet Take 20 mEq by mouth daily.    Marland Kitchen labetalol (NORMODYNE) 300 MG tablet Take 300 mg by mouth 3 (three) times daily.    Marland Kitchen MELATONIN PO Take 12.5 mg by mouth at bedtime as needed (sleep).    . predniSONE (DELTASONE) 5 MG tablet Take 5 mg by mouth daily. Reported on 11/02/2015    . tamsulosin (FLOMAX) 0.4 MG CAPS capsule Take 0.4 mg by mouth daily.    . traZODone (DESYREL) 150 MG tablet Take 150 mg by mouth at bedtime as needed for sleep.     No current facility-administered medications on file prior to visit.    Allergies  Allergen Reactions  .  Codeine Nausea Only  . Hydromorphone Other (See Comments)    hallucinations     Physical exam:  Today's Vitals   12/22/20 1059  BP: (!) 143/80  Pulse: 67  Weight: 236 lb (107 kg)  Height: 6' (1.829 m)   Body mass index is 32.01 kg/m.   Wt Readings from Last 3 Encounters:  12/22/20 236 lb (107 kg)  12/18/20 246 lb 3.2 oz (111.7 kg)  12/11/20 240 lb (108.9 kg)     Ht Readings from Last 3 Encounters:  12/22/20 6' (1.829 m)  12/18/20 6' (1.829 m)  12/11/20 6' (1.829 m)      General: The patient is awake, alert and appears  not in acute distress.  Head: Normocephalic, atraumatic. Neck is supple. Mallampati 4,  neck circumference:18 inches . Nasal airflow is patent.  Retrognathia is seen.   Cardiovascular:  Regular rate and cardiac rhythm by pulse,  without distended neck veins. Respiratory: Lungs are clear to auscultation.  Skin:  Without evidence of ankle edema, or rash.  Trunk: The patient's posture is stooped.    Neurologic exam : The patient is awake and alert, oriented to place and time.   Memory subjective described as intact. His wife feels he is much more forgetful, and has  slowed word retrieving.  mini-mental status exam   Montreal Cognitive Assessment  12/22/2020  Visuospatial/ Executive (0/5) 1  Naming (0/3) 3  Attention: Read list of digits (0/2) 2  Attention: Read list of letters (0/1) 1  Attention: Serial 7 subtraction starting at 100 (0/3) 3  Language: Repeat phrase (0/2) 1  Language : Fluency (0/1) 0  Abstraction (0/2) 2  Delayed Recall (0/5) 1  Orientation (0/6) 5  Total 19   MMSE - Mini Mental State Exam 12/22/2020  Orientation to time 5  Orientation to Place 4  Registration 3  Attention/ Calculation 5  Recall 2  Language- name 2 objects 2  Language- repeat 1  Language- follow 3 step command 3  Language- read & follow direction 1  Write a sentence 1  Copy design 1  Total score 28         Attention span & concentration ability appears restricited.  Speech is fluent, with dysarthria,mild dysphonia or witnessed aphasia.  Mood and affect are depressed ,   CN : no loss of smell or taste, Pupil equal, left ptosis, full EOM and accomodation.  Facial  ( lower) symmetry otherwise.  He has inability to lift the left eyebrow-   Bells palsy on the left.  Normal Neck and shoulder ROM.    Normal muscle tone. No cogwheeling.  No handwriting changes. No postural gait change, he has shuffling gait and walks much slower.  His left arm did not produce any arm swing and he kept  his left hand fisted by walking he turns his 4 steps and previously turns with 3 steps.  His right foot is everted pointing outwards and he could not tiptoe on his right foot.  There is some weakness in the right lower extremity.  His rotator cuff surgery has brought some positive results and he has normal arm swing on that side - he is right dominant.    He has gained weight, has  highgrade upper airway narrowing , has had uncontrolled hypertension. He has been the same as he was in 10-2019.  He does have multiple medical risk factors for the presence of sleep apnea including a previous tracheostomy, high hypoxemia risk because of pulmonary embolism  history and former smoking, history of heart disease and CKD stage III.  There is a questionable history of CVA in 2014 that I could not verify.  Multiple surgeries.  In addition to sleep initiation insomnia that has been sleep fragmentation by nocturia and sleep apnea could be a cause for frequent nocturnal bathroom break    After spending a total time of 60 minutes face to face including time for physical and neurologic examination, review of laboratory studies, personal review of imaging studies, reports and results of other testing and review of referral information / records as far as provided in visit, I have established the following assessments:  1) [name] has ells palsy on the right, gait changes with right leg weakness, loss of arm swing on the left.  2) he had a spell of enkephalopathy,  A stroke was not found, but he had pulmonary edema, had mental status changes, had abnormal labs, and one out of 4 CSF cultures was positive.   3)He has not followed up on his HST results form last year, 12-26-2019. He had a REM dependent form of apnea which is best treated with PAP- given he has had recently opiate pain meds after rotator cuff surgery, it is possible that that induced encephalopathic and metabolic changes and central apneas as well.   4)  brain fog has been worse since covid 19, otherwise mild symptoms.  A lot of visual spatial interference. He should not drive at night or in bed weather .  5) smoking - still smoking !!! No opiate pain meds.   CPAP will be tried in form of a PAP nap-     My Plan is to proceed with:  1) I repeated that only PAP therapy can help with his kind of apnea ( REM sleep dependent with hypoxemia, and  Bradycardia),  and that the HST was not indicating more than 2 nocturia  breaks.   He declined after explaining that he already struggles with sleep and a CPAP would be an added discomfort.  I explained he will need to at least try a PAP nap, repeat a sleep study, Trazodone 50 mg 2 at bedtime.   He has to avoid screen time in bed, no TV , no devices.    2)  I ordered an EEG-  And we will repeat a MRI with contrast in the near future. 2-3   3) renal function follow up with Dr Posey Pronto.   I would like to thank [name], MD , [address] [place],  [place] [address] for allowing me to meet with his patient.   CC: I will share my notes with Dr Virgina Jock , Dr. Jeffie Pollock , Dr Posey Pronto.   Electronically signed by: Larey Seat, MD 12/22/2020 11:19 AM  [facility] and [facility] Board certified by The [employer] of Sleep Medicine and Diplomate of the [employer] of Sleep Medicine. Board certified In Neurology through the [place], Fellow of the [employer] of Neurology. Medical Director of [facility].

## 2020-12-22 NOTE — Telephone Encounter (Signed)
pt have [facility] faxed the order they will reach out to the patient to schedule. Ph # [phone] & fax # [phone].

## 2020-12-23 ENCOUNTER — Other Ambulatory Visit: Payer: Self-pay

## 2020-12-23 ENCOUNTER — Ambulatory Visit: Payer: BC Managed Care – PPO | Admitting: Physical Therapy

## 2020-12-23 DIAGNOSIS — M25511 Pain in right shoulder: Secondary | ICD-10-CM

## 2020-12-23 DIAGNOSIS — M6281 Muscle weakness (generalized): Secondary | ICD-10-CM

## 2020-12-23 DIAGNOSIS — I5031 Acute diastolic (congestive) heart failure: Secondary | ICD-10-CM

## 2020-12-23 DIAGNOSIS — G8929 Other chronic pain: Secondary | ICD-10-CM

## 2020-12-23 DIAGNOSIS — M25611 Stiffness of right shoulder, not elsewhere classified: Secondary | ICD-10-CM

## 2020-12-23 NOTE — Therapy (Signed)
[place]. [place], [name], [address] Phone: [phone]   Fax:  [phone]  Patient Details  Name: [name] MRN: [id number] Date of Birth: Jan 26, 1956 Referring Provider:  [name], MD  Encounter Date: 12/23/2020   Emberlee Sortino,ANGIE PTA 12/23/2020, 8:38 AM  [place]. [place], [name], [address] Phone: [phone]   Fax:  [phone]

## 2020-12-24 ENCOUNTER — Ambulatory Visit: Payer: BC Managed Care – PPO

## 2020-12-24 DIAGNOSIS — G4734 Idiopathic sleep related nonobstructive alveolar hypoventilation: Secondary | ICD-10-CM

## 2020-12-24 DIAGNOSIS — G3184 Mild cognitive impairment, so stated: Secondary | ICD-10-CM

## 2020-12-24 DIAGNOSIS — I6782 Cerebral ischemia: Secondary | ICD-10-CM

## 2020-12-24 DIAGNOSIS — D869 Sarcoidosis, unspecified: Secondary | ICD-10-CM

## 2020-12-24 DIAGNOSIS — R410 Disorientation, unspecified: Secondary | ICD-10-CM | POA: Diagnosis not present

## 2020-12-24 DIAGNOSIS — G4733 Obstructive sleep apnea (adult) (pediatric): Secondary | ICD-10-CM

## 2020-12-24 DIAGNOSIS — G9340 Encephalopathy, unspecified: Secondary | ICD-10-CM

## 2020-12-25 ENCOUNTER — Other Ambulatory Visit: Payer: Self-pay

## 2020-12-25 ENCOUNTER — Telehealth: Payer: Self-pay | Admitting: *Deleted

## 2020-12-25 ENCOUNTER — Ambulatory Visit: Payer: BC Managed Care – PPO | Admitting: Internal Medicine

## 2020-12-25 ENCOUNTER — Encounter: Payer: Self-pay | Admitting: Internal Medicine

## 2020-12-25 DIAGNOSIS — B9561 Methicillin susceptible Staphylococcus aureus infection as the cause of diseases classified elsewhere: Secondary | ICD-10-CM

## 2020-12-25 DIAGNOSIS — M12811 Other specific arthropathies, not elsewhere classified, right shoulder: Secondary | ICD-10-CM

## 2020-12-25 DIAGNOSIS — E8770 Fluid overload, unspecified: Secondary | ICD-10-CM

## 2020-12-25 DIAGNOSIS — R7881 Bacteremia: Secondary | ICD-10-CM

## 2020-12-25 NOTE — Progress Notes (Signed)
Per verbal order from Dr Megan Salon, 45 cm Single Lumen Peripherally Inserted Central Catheter removed from right basilic, tip intact. No sutures present. RN confirmed length per chart. Dressing was clean and dry. Petroleum dressing applied. Pt advised no heavy lifting with this arm, leave dressing for 24 hours and call the office or seek emergent care if dressing becomes soaked with blood, swelling, or sharp pain presents. Patient verbalized understanding and agreement.  Patient's questions answered to their satisfaction. Patient tolerated procedure well, RN walked patient to check out. Pharmacy notified. Landis Gandy, RN

## 2020-12-25 NOTE — Assessment & Plan Note (Signed)
His volume overload has resolved.  There is no evidence of systolic heart failure or endocarditis.

## 2020-12-25 NOTE — Assessment & Plan Note (Signed)
There is no evidence of postoperative right shoulder infection.

## 2020-12-25 NOTE — Assessment & Plan Note (Signed)
I suspect that his MSSA bacteremia has been cured.  I will have him stop cefazolin and have his PICC removed today.  He will follow-up in 4 weeks, sooner if he has any evidence of relapse.

## 2020-12-25 NOTE — Telephone Encounter (Signed)
Spoke with Amy at [facility] to let them know PICC was pulled at office visit per Dr Hale Bogus order. Landis Gandy, RN

## 2020-12-25 NOTE — Progress Notes (Signed)
[facility]  Patient Active Problem List   Diagnosis Date Noted  . Rotator cuff arthropathy, right 12/04/2020    Priority: High  . Bacteremia due to methicillin susceptible Staphylococcus aureus (MSSA) 11/19/2020    Priority: High  . Volume overload 12/04/2020    Priority: Medium  . Shortness of breath 12/04/2020    Priority: Medium  . Encephalopathy acute 12/22/2020  . 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  . Chronic intermittent hypoxia with obstructive sleep apnea 12/22/2020  . Mild cognitive impairment 12/22/2020  . Acute combined systolic and diastolic congestive heart failure ([place]) 12/22/2020  . ARF (acute renal failure) ([place]) 11/19/2020  . Acute respiratory failure ([place]) 11/19/2020  . Cranial neuropathy   . Acute encephalopathy 11/18/2020  . Chronic right shoulder pain 06/18/2020  . OSA (obstructive sleep apnea) 05/05/2020  . Hypersomnia with sleep apnea 12/20/2019  . Psychophysiological insomnia 09/18/2019  . Nocturia more than twice per night 09/18/2019  . Renal hypertension 09/18/2019  . Moderate asthma 09/18/2019  . Tobacco abuse 09/18/2019  . Non-restorative sleep 09/18/2019  . Nontraumatic complete tear of right rotator cuff 08/15/2019  . Delirium 03/16/2019  . Stage 3b chronic kidney disease   . CVA (cerebral infarction)   . Diastolic dysfunction   . Pseudarthrosis 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 02/28/2019  . Intervertebral disc disorder with radiculopathy of lumbar region 09/13/2017  . Acute gouty arthritis 03/07/2017  . Arthralgia of left foot 03/07/2017  . Nephrolithiasis   . Fever, unspecified 11/02/2015  . Essential hypertension 02/07/2015  . Pulmonary embolism ([place]) 01/30/2013  . Sarcoidosis 01/30/2013  . Hemoptysis 01/30/2013  . SHOULDER PAIN, LEFT 11/28/2009    Patient's Medications  New Prescriptions   No medications on file  Previous Medications   ALBUTEROL (PROVENTIL HFA) 108  (90 BASE) MCG/ACT INHALER    Inhale 2 puffs into the lungs every 4 (four) hours as needed for wheezing or shortness of breath. Needs office visit for any refills.   ASPIRIN 81 MG TABLET    Take 1 tablet (81 mg total) by mouth daily.   ATORVASTATIN (LIPITOR) 20 MG TABLET    Take 20 mg by mouth daily.   BLACK ELDERBERRY PO    Take 2 each by mouth daily as needed (immune support). Gummies   BUMETANIDE (BUMEX) 1 MG TABLET    Take 1 tablet (1 mg total) by mouth 2 (two) times daily.   CEFAZOLIN (ANCEF) IVPB    Inject into the vein.   DILTIAZEM (CARDIZEM CD) 360 MG 24 HR CAPSULE    Take 360 mg by mouth daily.   FLUTICASONE (FLONASE) 50 MCG/ACT NASAL SPRAY    Place 2 sprays into both nostrils daily as needed for allergies or rhinitis.   ISOSORBIDE-HYDRALAZINE (BIDIL) 20-37.5 MG TABLET    Take 2 tablets by mouth 3 (three) times daily.   KLOR-CON M20 20 MEQ TABLET    Take 20 mEq by mouth daily.   LABETALOL (NORMODYNE) 300 MG TABLET    Take 300 mg by mouth 3 (three) times daily.   MELATONIN PO    Take 12.5 mg by mouth at bedtime as needed (sleep).   PREDNISONE (DELTASONE) 5 MG TABLET    Take 5 mg by mouth daily. Reported on 11/02/2015   TAMSULOSIN (FLOMAX) 0.4 MG CAPS CAPSULE    Take 0.4 mg by mouth daily.   TRAZODONE (DESYREL) 150 MG TABLET    Take 1 tablet (150 mg total) by mouth  at bedtime as needed for sleep.  Modified Medications   No medications on file  Discontinued Medications   No medications on file    Subjective: [name] is in for his routine follow-up visit.  He was hospitalized on 11/18/2020 with delirium and sepsis and was found to have MSSA bacteremia.  It was in only 1 of 2 bottles upon admission.  That set of blood cultures also grew staph epidermidis.  Repeat blood cultures the following day were negative and there was no evidence of endocarditis by TTE.  He improved rapidly.  My partner, Dr. Jule Ser, recommended PICC placement and 4 weeks of IV cefazolin through 12/17/2020.  [name] had right shoulder surgery for rotator cuff tear and late February.  He has healed without incident and is undergoing physical therapy.  He feels like he is making progress with his therapy.  He has not had any problems tolerating his cefazolin or his PICC.  He has continued to lose weight since he was started on diuretics.  Repeat TTE on 12/16/2019 showed a normal ejection fraction and no evidence of endocarditis.  Review of Systems: Review of Systems  Constitutional: Negative for chills, diaphoresis, fever and malaise/fatigue.  Respiratory: Negative for cough, sputum production, shortness of breath and wheezing.   Cardiovascular: Negative for chest pain.  Gastrointestinal: Negative for abdominal pain, diarrhea, nausea and vomiting.  Genitourinary: Negative for dysuria.  Musculoskeletal: Positive for joint pain.    Past Medical History:  Diagnosis Date  . Back pain   . CKD (chronic kidney disease), stage III ([place])   . CVA (cerebral infarction)   . Diastolic dysfunction   . ED (erectile dysfunction)   . GERD (gastroesophageal reflux disease)   . Hypercalcemia   . Hyperlipidemia   . Hypertension   . Insomnia   . Long-term use of high-risk medication   . Nephrocalcinosis   . Nephrolithiasis   . Obesity   . Osteopenia   . Pancreatitis   . PE (pulmonary embolism)   . Proteinuria   . Sarcoidosis   . Smoker     Social History   Tobacco Use  . Smoking status: Former Smoker    Packs/day: 0.25    Types: Cigarettes    Start date: 11/27/2020  . Smokeless tobacco: Never Used  Vaping Use  . Vaping Use: Never used  Substance Use Topics  . Alcohol use: No    Alcohol/week: 0.0 standard drinks  . Drug use: No    Family History  Problem Relation Age of Onset  . Hypertension Father   . CVA Father   . Lung cancer Father   . Alzheimer's disease Mother   . Hypertension Sister   . Heart failure Sister     Allergies  Allergen Reactions  . Codeine Nausea Only  .  Hydromorphone Other (See Comments)    hallucinations     Objective: Vitals:   12/25/20 0837  BP: (!) 158/88  Pulse: 67  Temp: 97.9 F (36.6 C)  TempSrc: Oral  SpO2: 94%  Weight: 231 lb (104.8 kg)  Height: 6' (1.829 m)   Body mass index is 31.33 kg/m.  Physical Exam Constitutional:      Comments: He is in good spirits.  His weight is down 29 pounds.  Cardiovascular:     Rate and Rhythm: Normal rate and regular rhythm.     Heart sounds: No murmur heard.   Pulmonary:     Effort: Pulmonary effort is normal.  Breath sounds: Normal breath sounds. No wheezing, rhonchi or rales.  Abdominal:     General: There is distension.     Palpations: Abdomen is soft.     Tenderness: There is no abdominal tenderness.  Musculoskeletal:     Right lower leg: No edema.     Left lower leg: No edema.     Comments: His right shoulder incisions have healed nicely.    Skin:    Findings: No rash.     Comments: His right arm PICC site looks good.  Psychiatric:        Mood and Affect: Mood normal.        Problem List Items Addressed This Visit      High   Bacteremia due to methicillin susceptible Staphylococcus aureus (MSSA)    I suspect that his MSSA bacteremia has been cured.  I will have him stop cefazolin and have his PICC removed today.  He will follow-up in 4 weeks, sooner if he has any evidence of relapse.      Rotator cuff arthropathy, right    There is no evidence of postoperative right shoulder infection.        Medium   Volume overload    His volume overload has resolved.  There is no evidence of systolic heart failure or endocarditis.          Michel Bickers, MD [facility] [phone] pager   [phone] cell 12/25/2020, 8:53 AM

## 2020-12-26 ENCOUNTER — Other Ambulatory Visit: Payer: Self-pay | Admitting: Internal Medicine

## 2020-12-26 DIAGNOSIS — E8770 Fluid overload, unspecified: Secondary | ICD-10-CM

## 2020-12-28 NOTE — Procedures (Signed)
This patient is a 65 year old African-American gentleman referred by [facility] hospitalists.  The patient is an established sleep patient.  He was hospitalized between the 15th and 21 November 2020 he was febrile, mildly hypoxic, and had tested positive for COVID the previous month.  He was diagnosed with an acute encephalopathy and treated empirically for meningitis, encephalitis.  He developed gram-positive bacteremia.  His mental status normalized with antibiotic therapy.  He is currently CPAP noncompliant.  He had developed acute renal failure and acute respiratory failure as well as acute encephalopathy.  The patient has a history of pulmonary emboli in 2014.  It remains possible that narcotic pain medication after shoulder injury/ surgery contributed to the respiratory dysfunction and encephalopathy.  This EEG was performed under the international 10-20 placement system of electrodes.  Electrical activity was acquired with a sampling rate of 500 Hz, high-frequency filter and low frequency filters were set the EEG was accompanied by a single EKG channel.  There was no video recording.  A posterior dominant rhythm of 9 Hz appears well formed and symmetric.  This posterior dominant rhythm promptly attenuates with eye opening.  Hyperventilation was not performed, leading to an amplitude buildup to the 30 V range.  This is also symmetric.  There is a well organized anterior-posterior gradient.  Photic stimulation was performed showing minimal entrainment at all frequencies.  No epileptiform activity is noted the patient did not enter sleep.  Heart rate was regular with a rate between 60 and 68 bpm.  This is a normal EEG for the patient's age and conscious state.  Larey Seat, MD

## 2020-12-28 NOTE — Progress Notes (Signed)
No epileptiform activity is noted and the patient did not enter sleep.   Heart rate was regular with a rate between 60 and 68 bpm.  This is a normal EEG for the patient's age and conscious state.  Larey Seat, MD

## 2020-12-29 ENCOUNTER — Telehealth: Payer: Self-pay | Admitting: *Deleted

## 2020-12-29 NOTE — Telephone Encounter (Signed)
I spoke to the patient and provided him with his EEG results. He verbalized understanding.

## 2020-12-29 NOTE — Telephone Encounter (Signed)
-----   Message from Larey Seat, MD sent at 12/28/2020  7:26 PM EDT ----- No epileptiform activity is noted and the patient did not enter sleep.   Heart rate was regular with a rate between 60 and 68 bpm.  This is a normal EEG for the patient's age and conscious state.  Larey Seat, MD

## 2020-12-30 ENCOUNTER — Ambulatory Visit: Payer: BC Managed Care – PPO | Admitting: Physical Therapy

## 2020-12-30 ENCOUNTER — Telehealth: Payer: Self-pay

## 2020-12-30 NOTE — Telephone Encounter (Signed)
LVM for pt to call me back to schedule sleep study  

## 2021-01-01 ENCOUNTER — Other Ambulatory Visit: Payer: Self-pay

## 2021-01-01 ENCOUNTER — Ambulatory Visit: Payer: BC Managed Care – PPO | Admitting: Physical Therapy

## 2021-01-01 DIAGNOSIS — M6281 Muscle weakness (generalized): Secondary | ICD-10-CM | POA: Diagnosis not present

## 2021-01-01 DIAGNOSIS — G8929 Other chronic pain: Secondary | ICD-10-CM

## 2021-01-01 DIAGNOSIS — M25511 Pain in right shoulder: Secondary | ICD-10-CM

## 2021-01-01 DIAGNOSIS — M25611 Stiffness of right shoulder, not elsewhere classified: Secondary | ICD-10-CM

## 2021-01-01 NOTE — Therapy (Signed)
[facility] [place]. [place], [name], [address] Phone: [phone]   Fax:  [phone]  Physical Therapy Treatment  Patient Details  Name: [name] MRN: [id number] Date of Birth: 14-Feb-1956 Referring Provider (PT): Ninfa Linden   Encounter Date: 01/01/2021   PT End of Session - 01/01/21 0847    Visit Number 8    Number of Visits 17    Date for PT Re-Evaluation 01/12/21    PT Start Time 0845    PT Stop Time 0928    PT Time Calculation (min) 43 min    Activity Tolerance Patient tolerated treatment well    Behavior During Therapy [facility] for tasks assessed/performed           Past Medical History:  Diagnosis Date  . Back pain   . CKD (chronic kidney disease), stage III ([place])   . CVA (cerebral infarction)   . Diastolic dysfunction   . ED (erectile dysfunction)   . GERD (gastroesophageal reflux disease)   . Hypercalcemia   . Hyperlipidemia   . Hypertension   . Insomnia   . Long-term use of high-risk medication   . Nephrocalcinosis   . Nephrolithiasis   . Obesity   . Osteopenia   . Pancreatitis   . PE (pulmonary embolism)   . Proteinuria   . Sarcoidosis   . Smoker     Past Surgical History:  Procedure Laterality Date  . ABDOMINAL EXPLORATION SURGERY    . BACK SURGERY    . CHOLECYSTECTOMY    . SHOULDER ARTHROSCOPY Right 10/30/2020   Procedure: ARTHROSCOPY SHOULDER WITH EXTENSIVE DEBRIDEMENT;  Surgeon: Mcarthur Rossetti, MD;  Location: [facility];  Service: Orthopedics;  Laterality: Right;  . SHOULDER SURGERY    . TRACHEOSTOMY     closed    There were no vitals filed for this visit.   Subjective Assessment - 01/01/21 0846    Subjective Feeling sore today, and in a little pain but thinks its from the cold.    Currently in Pain? Yes    Pain Score 5     Pain Location Shoulder    Pain Orientation Right                             [facility] Adult PT  Treatment/Exercise - 01/01/21 0001      Shoulder Exercises: Seated   Horizontal ABduction Strengthening;20 reps    Theraband Level (Shoulder Horizontal ABduction) Level 1 (Yellow)    External Rotation Strengthening;Both;20 reps    Theraband Level (Shoulder External Rotation) Level 1 (Yellow)      Shoulder Exercises: Standing   Internal Rotation Strengthening;20 reps    Internal Rotation Weight (lbs) 2# weight bar    Other Standing Exercises cable pulleys 5# chest press, shld ext 2 x10    Other Standing Exercises scap stabilizing 3 way yellow Tband, both side, x5      Shoulder Exercises: ROM/Strengthening   UBE (Upper Arm Bike) L2 37fd/3bck    Lat Pull 20 reps   20#   Cybex Row 20 reps   20#     Manual Therapy   Manual Therapy Passive ROM    Manual therapy comments Seated    Passive ROM RT shld all directions                    PT Short Term Goals - 11/28/20 01157  PT SHORT TERM GOAL #1   Title Independent with initial HEP    Status Achieved             PT Long Term Goals - 01/01/21 0926      PT LONG TERM GOAL #1   Title Independent with advanced HEP    Status On-going      PT LONG TERM GOAL #2   Title Report </= 2/10 R shoulder pain with ADLs using dominant right side    Status On-going      PT LONG TERM GOAL #3   Title R shoulder AROM symmetrical to left    Status Partially Met      PT LONG TERM GOAL #4   Title BUE strength 4+/5    Status On-going                 Plan - 01/01/21 0929    Clinical Impression Statement Pt tolerated progession of TE well this session. Some verbal and tactile cueing needed for lat pull down, horizontal abduction and ER, to maintain proper form and posture. Some assistance needed to achieve increased ROM without compensation during other TE. He did well on funtional reaching in all planes.    PT Treatment/Interventions ADLs/Self Care Home Management;Electrical Stimulation;Cryotherapy;Moist Heat;Neuromuscular  re-education;Therapeutic exercise;Therapeutic activities;Functional mobility training;Patient/family education;Manual techniques;Energy conservation;Taping;Vasopneumatic Device;Scar mobilization    PT Next Visit Plan continue to progress ther ex and ROM as tolerated and indicated.           Patient will benefit from skilled therapeutic intervention in order to improve the following deficits and impairments:  Decreased range of motion,Impaired UE functional use,Increased muscle spasms,Decreased endurance,Pain,Improper body mechanics,Impaired flexibility,Decreased mobility,Decreased strength,Increased edema  Visit Diagnosis: Chronic right shoulder pain  Stiffness of right shoulder, not elsewhere classified  Muscle weakness (generalized)     Problem List Patient Active Problem List   Diagnosis Date Noted  . Encephalopathy acute 12/22/2020  . 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  . Chronic intermittent hypoxia with obstructive sleep apnea 12/22/2020  . Mild cognitive impairment 12/22/2020  . Acute combined systolic and diastolic congestive heart failure ([place]) 12/22/2020  . Rotator cuff arthropathy, right 12/04/2020  . Volume overload 12/04/2020  . Shortness of breath 12/04/2020  . ARF (acute renal failure) ([place]) 11/19/2020  . Acute respiratory failure ([place]) 11/19/2020  . Bacteremia due to methicillin susceptible Staphylococcus aureus (MSSA) 11/19/2020  . Cranial neuropathy   . Acute encephalopathy 11/18/2020  . Chronic right shoulder pain 06/18/2020  . OSA (obstructive sleep apnea) 05/05/2020  . Hypersomnia with sleep apnea 12/20/2019  . Psychophysiological insomnia 09/18/2019  . Nocturia more than twice per night 09/18/2019  . Renal hypertension 09/18/2019  . Moderate asthma 09/18/2019  . Tobacco abuse 09/18/2019  . Non-restorative sleep 09/18/2019  . Nontraumatic complete tear of right rotator cuff 08/15/2019  . Delirium 03/16/2019  . Stage 3b  chronic kidney disease   . CVA (cerebral infarction)   . Diastolic dysfunction   . Pseudarthrosis 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 02/28/2019  . Intervertebral disc disorder with radiculopathy of lumbar region 09/13/2017  . Acute gouty arthritis 03/07/2017  . Arthralgia of left foot 03/07/2017  . Nephrolithiasis   . Fever, unspecified 11/02/2015  . Essential hypertension 02/07/2015  . Pulmonary embolism ([place]) 01/30/2013  . Sarcoidosis 01/30/2013  . Hemoptysis 01/30/2013  . SHOULDER PAIN, LEFT 11/28/2009    Ernst Spell, SPTA 01/01/2021, 9:34 AM  [place]. [place], [name],  [address] Phone: [phone]   Fax:  [phone]  Name: TRAVARIUS LANGE MRN: [id number] Date of Birth: 1956/08/21

## 2021-01-03 LAB — BASIC METABOLIC PANEL
BUN/Creatinine Ratio: 10 (ref 10–24)
BUN: 19 mg/dL (ref 8–27)
CO2: 23 mmol/L (ref 20–29)
Calcium: 9.7 mg/dL (ref 8.6–10.2)
Chloride: 101 mmol/L (ref 96–106)
Creatinine, Ser: 1.97 mg/dL — ABNORMAL HIGH (ref 0.76–1.27)
Glucose: 166 mg/dL — ABNORMAL HIGH (ref 65–99)
Potassium: 4.3 mmol/L (ref 3.5–5.2)
Sodium: 145 mmol/L — ABNORMAL HIGH (ref 134–144)
eGFR: 37 mL/min/{1.73_m2} — ABNORMAL LOW (ref >59)

## 2021-01-03 LAB — MAGNESIUM: Magnesium: 1.8 mg/dL (ref 1.6–2.3)

## 2021-01-03 LAB — PRO B NATRIURETIC PEPTIDE: NT-Pro BNP: 2337 pg/mL — ABNORMAL HIGH (ref 0–210)

## 2021-01-04 ENCOUNTER — Telehealth: Payer: Self-pay | Admitting: Neurology

## 2021-01-04 DIAGNOSIS — D869 Sarcoidosis, unspecified: Secondary | ICD-10-CM

## 2021-01-04 NOTE — Telephone Encounter (Signed)
I received a call from [facility] about results.  "Impression: Enhancement along with mild thickening of the infundibulum, can be seen with Sarcoidosis or hypophysitis" per Levada Dy  Unfortunately cannot review myself,  report and images not in Epic, or in canopy as of yet. MRI 11/19/20 did not show this finding. Review of chart, patient does have a history of sarcoidosis.   I will not be in the office until Wednesday, please discuss with the work-in physician Monday morning as Dr. Brett Fairy is out of the office as well.

## 2021-01-05 ENCOUNTER — Telehealth: Payer: Self-pay | Admitting: Neurology

## 2021-01-05 NOTE — Telephone Encounter (Signed)
Yes I called and left a message on patient's wife's answering machine.

## 2021-01-05 NOTE — Telephone Encounter (Signed)
I reviewed MRI scan of the brain with and without contrast results on patient [name] done at [facility] on 01/02/2021 which shows mild changes of small vessel disease and nonspecific mild thickening of infundibulum which may be from patient's known history of sarcoidosis.  I called both the listed landline number and cell phone and left a message stating this findings are expected and nothing to worry about.  They were advised to call back the office if they had further questions.

## 2021-01-05 NOTE — Progress Notes (Signed)
Called pt no answer, left a vm

## 2021-01-05 NOTE — Telephone Encounter (Signed)
Dr Leonie Man, attempted to call the patient to review the report, no answer. LVM for patient.

## 2021-01-06 ENCOUNTER — Telehealth: Payer: Self-pay

## 2021-01-06 ENCOUNTER — Ambulatory Visit: Payer: BC Managed Care – PPO | Attending: Orthopaedic Surgery | Admitting: Physical Therapy

## 2021-01-06 ENCOUNTER — Other Ambulatory Visit: Payer: Self-pay

## 2021-01-06 DIAGNOSIS — M25612 Stiffness of left shoulder, not elsewhere classified: Secondary | ICD-10-CM | POA: Insufficient documentation

## 2021-01-06 DIAGNOSIS — R600 Localized edema: Secondary | ICD-10-CM | POA: Diagnosis present

## 2021-01-06 DIAGNOSIS — M25511 Pain in right shoulder: Secondary | ICD-10-CM

## 2021-01-06 DIAGNOSIS — G8929 Other chronic pain: Secondary | ICD-10-CM | POA: Insufficient documentation

## 2021-01-06 DIAGNOSIS — M6281 Muscle weakness (generalized): Secondary | ICD-10-CM

## 2021-01-06 DIAGNOSIS — M25611 Stiffness of right shoulder, not elsewhere classified: Secondary | ICD-10-CM | POA: Diagnosis present

## 2021-01-06 NOTE — Telephone Encounter (Signed)
Returned call back, in regards to scheduling sleep study that Dr. Brett Fairy has ordered.

## 2021-01-06 NOTE — Progress Notes (Signed)
Second attempt to call pt no answer

## 2021-01-06 NOTE — Progress Notes (Signed)
Called and spoke to pt regarding lab results, pt voiced understanding.

## 2021-01-06 NOTE — Therapy (Signed)
[place] [place], [name], [address] Phone: [phone]   Fax:  [phone]  Physical Therapy Treatment  Patient Details  Name: [name] MRN: [id number] Date of Birth: Oct 12, 1955 Referring Provider (PT): Ninfa Linden   Encounter Date: 01/06/2021   PT End of Session - 01/06/21 0753    Visit Number 9    Number of Visits 17    Date for PT Re-Evaluation 01/12/21    PT Start Time 0750    PT Stop Time 0830    PT Time Calculation (min) 40 min    Activity Tolerance Patient tolerated treatment well    Behavior During Therapy [facility] for tasks assessed/performed           Past Medical History:  Diagnosis Date  . Back pain   . CKD (chronic kidney disease), stage III ([place])   . CVA (cerebral infarction)   . Diastolic dysfunction   . ED (erectile dysfunction)   . GERD (gastroesophageal reflux disease)   . Hypercalcemia   . Hyperlipidemia   . Hypertension   . Insomnia   . Long-term use of high-risk medication   . Nephrocalcinosis   . Nephrolithiasis   . Obesity   . Osteopenia   . Pancreatitis   . PE (pulmonary embolism)   . Proteinuria   . Sarcoidosis   . Smoker     Past Surgical History:  Procedure Laterality Date  . ABDOMINAL EXPLORATION SURGERY    . BACK SURGERY    . CHOLECYSTECTOMY    . SHOULDER ARTHROSCOPY Right 10/30/2020   Procedure: ARTHROSCOPY SHOULDER WITH EXTENSIVE DEBRIDEMENT;  Surgeon: Mcarthur Rossetti, MD;  Location: [place];  Service: Orthopedics;  Laterality: Right;  . SHOULDER SURGERY    . TRACHEOSTOMY     closed    There were no vitals filed for this visit.   Subjective Assessment - 01/06/21 0753    Subjective Pt reports no pain today, "so far so good"    Currently in Pain? No/denies                             [facility] Adult PT Treatment/Exercise - 01/06/21 0001      Shoulder Exercises: Standing   External Rotation Strengthening;20  reps   red Tband   Internal Rotation Strengthening;20 reps   red tband   Flexion Strengthening;Both;20 reps    Shoulder Flexion Weight (lbs) 3# weighted bar    Other Standing Exercises cable pulleys 5# shld ext 2 x10    Other Standing Exercises 4 level shelf tap ext/abd 1x5 no weight, 1x10 3#, scap stabilizing 3 way red Tband R x10      Shoulder Exercises: ROM/Strengthening   UBE (Upper Arm Bike) L2 26fd/3back    Lat Pull 20 reps   25#   Cybex Press 20 reps   20#   Cybex Row 20 reps   25#     Manual Therapy   Manual Therapy Passive ROM    Manual therapy comments supine    Passive ROM RT shld all directions                    PT Short Term Goals - 11/28/20 0827      PT SHORT TERM GOAL #1   Title Independent with initial HEP    Status Achieved             PT Long  Term Goals - 01/06/21 7425      PT LONG TERM GOAL #1   Title Independent with advanced HEP    Status On-going      PT LONG TERM GOAL #2   Title Report </= 2/10 R shoulder pain with ADLs using dominant right side    Status On-going      PT LONG TERM GOAL #3   Title R shoulder AROM symmetrical to left    Status Partially Met      PT LONG TERM GOAL #4   Title BUE strength 4+/5    Status On-going                 Plan - 01/06/21 9563    Clinical Impression Statement Pt continued to tolerate progression of TE well. Verbal and tactile cueing needed for ER/IR for proper form and posture. Cueing needed during all standing ther ex to keep shoulders neutral and relaxed. Some assistance needed to increased ROM without compensation during flexion ex. Pt was unable to complete shoulder flexion with free weights keeping good form and not bending elbows, flexion with weighted bar was more successful.    PT Treatment/Interventions ADLs/Self Care Home Management;Electrical Stimulation;Cryotherapy;Moist Heat;Neuromuscular re-education;Therapeutic exercise;Therapeutic activities;Functional mobility  training;Patient/family education;Manual techniques;Energy conservation;Taping;Vasopneumatic Device;Scar mobilization    PT Next Visit Plan continue to progress ther ex and ROM as tolerated and indicated.           Patient will benefit from skilled therapeutic intervention in order to improve the following deficits and impairments:  Decreased range of motion,Impaired UE functional use,Increased muscle spasms,Decreased endurance,Pain,Improper body mechanics,Impaired flexibility,Decreased mobility,Decreased strength,Increased edema  Visit Diagnosis: Chronic right shoulder pain  Stiffness of right shoulder, not elsewhere classified  Muscle weakness (generalized)  Localized edema  Stiffness of left shoulder, not elsewhere classified     Problem List Patient Active Problem List   Diagnosis Date Noted  . Encephalopathy acute 12/22/2020  . 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  . Chronic intermittent hypoxia with obstructive sleep apnea 12/22/2020  . Mild cognitive impairment 12/22/2020  . Acute combined systolic and diastolic congestive heart failure ([place]) 12/22/2020  . Rotator cuff arthropathy, right 12/04/2020  . Volume overload 12/04/2020  . Shortness of breath 12/04/2020  . ARF (acute renal failure) ([place]) 11/19/2020  . Acute respiratory failure ([place]) 11/19/2020  . Bacteremia due to methicillin susceptible Staphylococcus aureus (MSSA) 11/19/2020  . Cranial neuropathy   . Acute encephalopathy 11/18/2020  . Chronic right shoulder pain 06/18/2020  . OSA (obstructive sleep apnea) 05/05/2020  . Hypersomnia with sleep apnea 12/20/2019  . Psychophysiological insomnia 09/18/2019  . Nocturia more than twice per night 09/18/2019  . Renal hypertension 09/18/2019  . Moderate asthma 09/18/2019  . Tobacco abuse 09/18/2019  . Non-restorative sleep 09/18/2019  . Nontraumatic complete tear of right rotator cuff 08/15/2019  . Delirium 03/16/2019  . Stage 3b  chronic kidney disease   . CVA (cerebral infarction)   . Diastolic dysfunction   . Pseudarthrosis 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 02/28/2019  . Intervertebral disc disorder with radiculopathy of lumbar region 09/13/2017  . Acute gouty arthritis 03/07/2017  . Arthralgia of left foot 03/07/2017  . Nephrolithiasis   . Fever, unspecified 11/02/2015  . Essential hypertension 02/07/2015  . Pulmonary embolism ([place]) 01/30/2013  . Sarcoidosis 01/30/2013  . Hemoptysis 01/30/2013  . SHOULDER PAIN, LEFT 11/28/2009    Ernst Spell, SPTA 01/06/2021, 8:36 AM  [address] [place], [name], [address]  Phone: [phone]   Fax:  [phone]  Name: [name] MRN: [id number] Date of Birth: [birth year]

## 2021-01-08 ENCOUNTER — Encounter: Payer: Self-pay | Admitting: Cardiology

## 2021-01-08 ENCOUNTER — Ambulatory Visit: Payer: BC Managed Care – PPO | Admitting: Cardiology

## 2021-01-08 ENCOUNTER — Other Ambulatory Visit: Payer: Self-pay

## 2021-01-08 VITALS — BP 145/87 | HR 64 | Temp 98.0°F | Resp 17 | Ht 72.0 in | Wt 222.6 lb

## 2021-01-08 DIAGNOSIS — M7989 Other specified soft tissue disorders: Secondary | ICD-10-CM

## 2021-01-08 DIAGNOSIS — R0609 Other forms of dyspnea: Secondary | ICD-10-CM

## 2021-01-08 DIAGNOSIS — Z720 Tobacco use: Secondary | ICD-10-CM

## 2021-01-08 DIAGNOSIS — I10 Essential (primary) hypertension: Secondary | ICD-10-CM

## 2021-01-08 DIAGNOSIS — B9561 Methicillin susceptible Staphylococcus aureus infection as the cause of diseases classified elsewhere: Secondary | ICD-10-CM

## 2021-01-08 DIAGNOSIS — N179 Acute kidney failure, unspecified: Secondary | ICD-10-CM

## 2021-01-08 DIAGNOSIS — R7881 Bacteremia: Secondary | ICD-10-CM

## 2021-01-08 DIAGNOSIS — E782 Mixed hyperlipidemia: Secondary | ICD-10-CM

## 2021-01-08 DIAGNOSIS — I5031 Acute diastolic (congestive) heart failure: Secondary | ICD-10-CM

## 2021-01-08 DIAGNOSIS — G4733 Obstructive sleep apnea (adult) (pediatric): Secondary | ICD-10-CM

## 2021-01-08 DIAGNOSIS — R0600 Dyspnea, unspecified: Secondary | ICD-10-CM

## 2021-01-08 MED ORDER — BUMETANIDE 1 MG PO TABS: 1.0000 mg | ORAL_TABLET | Freq: Every day | ORAL | 0 refills | Status: DC

## 2021-01-08 NOTE — Progress Notes (Signed)
Date:  01/08/2021   ID:  [name], DOB [birth year], MRN [id number]  PCP:  [name], MD  Cardiologist: Rex Kras, DO, [facility] (established care 12/10/2020)  Date: 01/08/21 Last Office Visit: 12/18/2020  Chief Complaint  Patient presents with  . Acute heart failure with preserved ejection fraction (HFpEF    2 WEEKS    HPI  [name] is a 65 y.o. male who presents to the office with a chief complaint of " 2-week follow-up for heart failure management." Patient's past medical history and cardiovascular risk factors include: Sarcoidosis, HTN, Tobacco abuse, Hx of PE (was on Xarelto for 1 year), OSA not on CPAP, MSSA Bacteremia (March 2022), Hx of cardiac arrest (2002 during his hospitalization for pancreatitis per wife).  He is referred to the office at the request of [name], MD for evaluation of heart failure.  Patient is accompanied by his wife [name] at today's office visit.  Patient was recently hospitalized for an elective right shoulder repair surgery in February 2022 and did well.  However in March 2022 he had an episode of confusion and altered mental status for which he went to ED and was hospitalized.  He was diagnosed with MSSA bacteremia treated with IV antibiotics via PICC line which has been discontinued since last visit.    After his hospitalization from March 2022 he has been experiencing shortness of breath at rest and with effort related activity, along with lower extremity swelling right greater than left, and symptoms suggestive of congestive heart failure which included orthopnea, paroxysmal nocturnal dyspnea.  He underwent a DVT study which was negative.  Subsequently uptitrate guideline directed medical therapy with focus on diuresis.  He now presents for 2-week follow-up..  Patient states that his lower extremity swelling, orthopnea, paroxysmal nocturnal dyspnea all have resolved.  Home blood pressure and weight log reviewed.  He is lost approximately  24 pounds based on his home scale over the last 2 weeks.  Independently reviewed labs from 01/02/2021.  I do not have a recent baseline kidney function but it appears that it may be closer to 1.5 mg/dL which was a last kidney function from July 2020.  Most recent lab notes improvement in serum creatinine as well as NT proBNP.  Patient states that he is 90% back to his baseline with regards to breathing and effort related symptoms.  Patient states that he is running behind his grandkids and is not short of breath as before.  Patient states that he is a 50% back to his baseline.  He would like to have some more energy and strength.  Patient is scheduled for a sleep study in the near future with Dr. Brett Fairy.  Patient is encouraged to consider CPAP intervention if clinically warranted as it will greatly impact/improve his cardiovascular comorbidities.  FUNCTIONAL STATUS: No structured exercise program or daily routine.   ALLERGIES: Allergies  Allergen Reactions  . Codeine Nausea Only  . Hydromorphone Other (See Comments)    hallucinations     MEDICATION LIST PRIOR TO VISIT: Current Meds  Medication Sig  . aspirin 81 MG tablet Take 1 tablet (81 mg total) by mouth daily.  Marland Kitchen atorvastatin (LIPITOR) 20 MG tablet Take 20 mg by mouth daily.  Marland Kitchen BLACK ELDERBERRY PO Take 2 each by mouth daily as needed (immune support). Gummies  . cetirizine (ZYRTEC) 10 MG tablet Take 1 tablet by mouth as needed.  . diltiazem (CARDIZEM CD) 360 MG 24 hr capsule Take 360 mg by  mouth daily.  . diphenhydrAMINE (BENADRYL ALLERGY) 25 mg capsule Take 25 mg by mouth every 6 (six) hours as needed.  . fluticasone (FLONASE) 50 MCG/ACT nasal spray Place 2 sprays into both nostrils daily as needed for allergies or rhinitis.  Marland Kitchen isosorbide-hydrALAZINE (BIDIL) 20-37.5 MG tablet Take 2 tablets by mouth 3 (three) times daily.  Marland Kitchen KLOR-CON M20 20 MEQ tablet Take 20 mEq by mouth daily.  Marland Kitchen labetalol (NORMODYNE) 300 MG tablet Take 300  mg by mouth 3 (three) times daily.  Marland Kitchen MELATONIN PO Take 12.5 mg by mouth at bedtime as needed (sleep).  . predniSONE (DELTASONE) 5 MG tablet Take 5 mg by mouth daily. Reported on 11/02/2015  . tamsulosin (FLOMAX) 0.4 MG CAPS capsule Take 0.4 mg by mouth daily.  Deborah Chalk ER 360 MG 24 hr capsule Take 360 mg by mouth daily.  . traZODone (DESYREL) 150 MG tablet Take 1 tablet (150 mg total) by mouth at bedtime as needed for sleep.  . [DISCONTINUED] bumetanide (BUMEX) 1 MG tablet Take 1 tablet (1 mg total) by mouth 2 (two) times daily.     PAST MEDICAL HISTORY: Past Medical History:  Diagnosis Date  . Back pain   . CKD (chronic kidney disease), stage III ([place])   . CVA (cerebral infarction)   . Diastolic dysfunction   . ED (erectile dysfunction)   . GERD (gastroesophageal reflux disease)   . Hypercalcemia   . Hyperlipidemia   . Hypertension   . Insomnia   . Long-term use of high-risk medication   . Nephrocalcinosis   . Nephrolithiasis   . Obesity   . Osteopenia   . Pancreatitis   . PE (pulmonary embolism)   . Proteinuria   . Sarcoidosis   . Smoker   Patient and wife denies hx of CVA.   PAST SURGICAL HISTORY: Past Surgical History:  Procedure Laterality Date  . ABDOMINAL EXPLORATION SURGERY    . BACK SURGERY    . CHOLECYSTECTOMY    . SHOULDER ARTHROSCOPY Right 10/30/2020   Procedure: ARTHROSCOPY SHOULDER WITH EXTENSIVE DEBRIDEMENT;  Surgeon: Mcarthur Rossetti, MD;  Location: [place];  Service: Orthopedics;  Laterality: Right;  . SHOULDER SURGERY    . TRACHEOSTOMY     closed    FAMILY HISTORY: The patient family history includes Alzheimer's disease in his mother; CVA in his father; Heart failure in his sister; Hypertension in his father and sister; Lung cancer in his father.  SOCIAL HISTORY:  The patient  reports that he quit smoking 9 days ago. His smoking use included cigarettes. He started smoking about 6 weeks ago. He has a 3.75 pack-year smoking  history. He has never used smokeless tobacco. He reports that he does not drink alcohol and does not use drugs.  REVIEW OF SYSTEMS: Review of Systems  Constitutional: Positive for malaise/fatigue (improved) and weight loss. Negative for chills, fever and weight gain.  HENT: Negative for hoarse voice and nosebleeds.   Eyes: Negative for discharge, double vision and pain.  Cardiovascular: Negative for chest pain, claudication, dyspnea on exertion, leg swelling, near-syncope, orthopnea, palpitations, paroxysmal nocturnal dyspnea and syncope.  Respiratory: Negative for hemoptysis and shortness of breath.   Musculoskeletal: Negative for muscle cramps and myalgias.  Gastrointestinal: Negative for abdominal pain, constipation, diarrhea, hematemesis, hematochezia, melena, nausea and vomiting.  Neurological: Negative for dizziness and light-headedness.    PHYSICAL EXAM: Vitals with BMI 01/08/2021 12/25/2020 12/22/2020  Height '6\' 0"'  '6\' 0"'  '6\' 0"'   Weight 222 lbs 10 oz  231 lbs 236 lbs  BMI 62.26 33.35 32  Systolic 456 256 389  Diastolic 87 88 80  Pulse 64 67 67    CONSTITUTIONAL: Appears older than stated age, slightly somnolent, well-developed and well-nourished. No acute distress.  SKIN: Skin is warm and dry. No rash noted. No cyanosis. No pallor. No jaundice HEAD: Normocephalic and atraumatic.  EYES: No scleral icterus MOUTH/THROAT: Moist oral membranes.  NECK: No JVD present. No thyromegaly noted. No carotid bruits  LYMPHATIC: No visible cervical adenopathy.  CHEST Normal respiratory effort. No intercostal retractions  LUNGS: Clear to auscultation bilaterally.  No stridor. No wheezes. No rales.  CARDIOVASCULAR: Regular, positive H7-D4, soft holosystolic murmur heard at the apex, no gallops or rubs. ABDOMINAL: Obese, soft soft, nontender, distended, positive bowel sounds in all 4 quadrants, no apparent ascites.  EXTREMITIES: Bilateral lower extremity swelling right greater than left, +1 pitting  edema bilaterally, no open wounds or ulcers.  PICC line noted in the right antecubital fossa HEMATOLOGIC: No significant bruising NEUROLOGIC: Oriented to person, place, and time. Nonfocal. Normal muscle tone.  PSYCHIATRIC: Normal mood and affect. Normal behavior. Cooperative  CARDIAC DATABASE: EKG: 12/18/2020: Sinus bradycardia, 58 bpm, left atrial enlargement, diffuse T wave inversions.  No significant change compared to prior ECG.  Echocardiogram: 12/15/2020:  Normal LV systolic function with visual EF 55-60%.  Left ventricle cavity is normal in size.  Moderate left ventricular hypertrophy. Normal global wall motion.  Indeterminate diastolic filling pattern, normal LAP.  Left atrial cavity is mildly dilated.  Mild (Grade I) aortic regurgitation.  Mild (Grade I) mitral regurgitation.  Mild tricuspid regurgitation. No evidence of pulmonary hypertension.  IVC is dilated with a respiratory response of >50%.  Prior study 11/19/2020: LVEF 60-65%, Grade I DD, Mild LAE, Trivial MR, RAP 23mHg.  Stress Testing: No results found for this or any previous visit from the past 1095 days.   Heart Catheterization: None  LABORATORY DATA: CBC Latest Ref Rng & Units 11/21/2020 11/20/2020 11/19/2020  WBC 4.0 - 10.5 K/uL 6.4 6.4 7.0  Hemoglobin 13.0 - 17.0 g/dL 12.0(L) 11.9(L) 12.4(L)  Hematocrit 39.0 - 52.0 % 36.2(L) 35.5(L) 39.4  Platelets 150 - 400 K/uL 204 200 208    CMP Latest Ref Rng & Units 01/02/2021 12/15/2020 11/22/2020  Glucose 65 - 99 mg/dL 166(H) 108(H) 87  BUN 8 - 27 mg/dL '19 18 8  ' Creatinine 0.76 - 1.27 mg/dL 1.97(H) 2.12(H) 1.53(H)  Sodium 134 - 144 mmol/L 145(H) 147(H) 138  Potassium 3.5 - 5.2 mmol/L 4.3 4.0 3.1(L)  Chloride 96 - 106 mmol/L 101 102 102  CO2 20 - 29 mmol/L '23 27 29  ' Calcium 8.6 - 10.2 mg/dL 9.7 9.4 8.6(L)  Total Protein 6.5 - 8.1 g/dL - - 6.0(L)  Total Bilirubin 0.3 - 1.2 mg/dL - - 1.0  Alkaline Phos 38 - 126 U/L - - 81  AST 15 - 41 U/L - - 23  ALT 0 - 44 U/L -  - 36    Lipid Panel     Component Value Date/Time   CHOL 123 03/15/2019 0425   TRIG 130 03/15/2019 0425   HDL 32 (L) 03/15/2019 0425   CHOLHDL 3.8 03/15/2019 0425   VLDL 26 03/15/2019 0425   LDLCALC 65 03/15/2019 0425    No components found for: NTPROBNP Recent Labs    12/15/20 0909 01/02/21 0752  PROBNP 4,473* 2,337*   Recent Labs    11/19/20 0233  TSH 1.247    BMP Recent Labs  11/20/20 0706 11/21/20 0302 11/22/20 0140 12/15/20 0909 01/02/21 0751  NA 142 140 138 147* 145*  K 4.2 3.2* 3.1* 4.0 4.3  CL 103 103 102 102 101  CO2 '31 29 29 27 23  ' GLUCOSE 110* 97 87 108* 166*  BUN 14 6* '8 18 19  ' CREATININE 2.02* 1.66* 1.53* 2.12* 1.97*  CALCIUM 8.6* 8.8* 8.6* 9.4 9.7  GFRNONAA 36* 46* 51*  --   --     HEMOGLOBIN A1C Lab Results  Component Value Date   HGBA1C 5.8 (H) 03/15/2019   MPG 120 03/15/2019    External Labs: Collected: 12/08/2020 Creatinine 1.7 mg/dL. (Serum creatinine 1.6 mg/dL collected 07/11/2020) eGFR: 40.9 mL/min per 1.73 m Sodium 143, potassium 3.5, chloride 104, bicarb 32 Hemoglobin 12.6, hematocrit 38.2 NT proBNP 5576  IMPRESSION:    ICD-10-CM   1. Acute heart failure with preserved ejection fraction (HFpEF) (HCC)  A76.81 Basic metabolic panel    Magnesium    Pro b natriuretic peptide (BNP)    bumetanide (BUMEX) 1 MG tablet  2. AKI (acute kidney injury) ([place])  N17.9   3. Leg swelling  M79.89   4. Essential hypertension  I10   5. Bacteremia due to methicillin susceptible Staphylococcus aureus (MSSA)  R78.81    B95.61   6. Mixed hyperlipidemia  E78.2   7. Dyspnea on exertion  R06.00   8. Tobacco abuse  Z72.0   9. OSA (obstructive sleep apnea)  G47.33      RECOMMENDATIONS: [name] is a 65 y.o. male whose past medical history and cardiac risk factors include: Sarcoidosis, HTN, Tobacco abuse, Hx of PE (was on Xarelto for 1 year), OSA not on CPAP, MSSA Bacteremia (March 2022), Hx of cardiac arrest (2002 during his  hospitalization for pancreatitis per wife).  Acute heart failure with preserved EF:  Improving.  Patient has lost 24 pounds secondary to diuresis.  Independently reviewed labs from 01/02/2021 which notes improving renal function and reduced NT proBNP.  Patient is not complaining of orthopnea, paroxysmal nocturnal dyspnea and lower extremity swelling has significantly improved.  Will decrease Bumex to 1 mg p.o. daily.  I would like him to start Entresto once his serum creatinine is relatively at baseline.  A voucher for [employer] has been provided for the first 30 days to the patient and his wife.  We will wait another week to see how his serum creatinine levels improve prior to starting Entresto.  Patient also has a sleep study scheduled with Dr. Brett Fairy in the near future.  Medications reconciled.  Acute kidney injury:  Improving.  Independently reviewed labs from 01/02/2021.  His baseline serum creatinine as of July 2020 is approximately 1.5 mg/dL.  We will decrease Bumex to 1 mg p.o. daily and will recheck blood work in 1 week.  If his serum creatinine is back to baseline we will discontinue Bumex and transition him to [place] at that time.  Encouraged him to keep a log of his weights on a daily basis.  Encouraged intake of healthy protein to help increase the albumin/prealbumin to help improve the oncotic pressure to prevent third spacing.  Avoid nephrotoxic agents.  Avoid NSAIDs Motrins Naprosyn ibuprofen.  Lower extremity swelling: Improving  Lower extremity duplex negative for DVT.  Patient is encouraged to use compression stockings as directed for 8 hours a day if possible.  Lower extremity swelling is essentially resolved.  He has lost approximately 24 pounds since last office visit.  Educated on the importance of a  low-salt diet and increasing protein by either drinking Ensure or boost to help improve his oncotic pressures.  Benign essential  hypertension:  Office blood pressure is better controlled.  Medications reconciled.  Discontinued losartan due to acute kidney injury in the past  We will decrease Bumex to 1 mg p.o. daily.  Increase BiDil to 2 tablets 3 times a day.    Low-salt diet recommended.  We will monitor.  MSSA bacteremia: Since last visit patient followed up with infectious disease and had his PICC line removed and he has completed the antibiotic course.  Patient informs me that over the last 2 weeks he has not been smoking.  He is congratulated on his efforts but will keep a close eye on this at the upcoming office visit as well.  FINAL MEDICATION LIST END OF ENCOUNTER: Meds ordered this encounter  Medications  . bumetanide (BUMEX) 1 MG tablet    Sig: Take 1 tablet (1 mg total) by mouth daily.    Dispense:  30 tablet    Refill:  0    Medications Discontinued During This Encounter  Medication Reason  . albuterol (PROVENTIL HFA) 108 (90 Base) MCG/ACT inhaler Error  . ceFAZolin (ANCEF) IVPB Error  . isosorbide-hydrALAZINE (BIDIL) 20-37.5 MG tablet Error  . bumetanide (BUMEX) 1 MG tablet      Current Outpatient Medications:  .  aspirin 81 MG tablet, Take 1 tablet (81 mg total) by mouth daily., Disp: , Rfl:  .  atorvastatin (LIPITOR) 20 MG tablet, Take 20 mg by mouth daily., Disp: , Rfl:  .  BLACK ELDERBERRY PO, Take 2 each by mouth daily as needed (immune support). Gummies, Disp: , Rfl:  .  cetirizine (ZYRTEC) 10 MG tablet, Take 1 tablet by mouth as needed., Disp: , Rfl:  .  diltiazem (CARDIZEM CD) 360 MG 24 hr capsule, Take 360 mg by mouth daily., Disp: , Rfl:  .  diphenhydrAMINE (BENADRYL ALLERGY) 25 mg capsule, Take 25 mg by mouth every 6 (six) hours as needed., Disp: , Rfl:  .  fluticasone (FLONASE) 50 MCG/ACT nasal spray, Place 2 sprays into both nostrils daily as needed for allergies or rhinitis., Disp: , Rfl:  .  isosorbide-hydrALAZINE (BIDIL) 20-37.5 MG tablet, Take 2 tablets by mouth 3  (three) times daily., Disp: , Rfl:  .  KLOR-CON M20 20 MEQ tablet, Take 20 mEq by mouth daily., Disp: , Rfl:  .  labetalol (NORMODYNE) 300 MG tablet, Take 300 mg by mouth 3 (three) times daily., Disp: , Rfl:  .  MELATONIN PO, Take 12.5 mg by mouth at bedtime as needed (sleep)., Disp: , Rfl:  .  predniSONE (DELTASONE) 5 MG tablet, Take 5 mg by mouth daily. Reported on 11/02/2015, Disp: , Rfl:  .  tamsulosin (FLOMAX) 0.4 MG CAPS capsule, Take 0.4 mg by mouth daily., Disp: , Rfl:  .  TIADYLT ER 360 MG 24 hr capsule, Take 360 mg by mouth daily., Disp: , Rfl:  .  traZODone (DESYREL) 150 MG tablet, Take 1 tablet (150 mg total) by mouth at bedtime as needed for sleep., Disp: 90 tablet, Rfl: 3 .  bumetanide (BUMEX) 1 MG tablet, Take 1 tablet (1 mg total) by mouth daily., Disp: 30 tablet, Rfl: 0  Orders Placed This Encounter  Procedures  . Basic metabolic panel  . Magnesium  . Pro b natriuretic peptide (BNP)    There are no Patient Instructions on file for this visit.  --Continue cardiac medications as reconciled in final medication list. --  Return in about 4 weeks (around 02/05/2021) for heart failure management.. Or sooner if needed. --Continue follow-up with your primary care physician regarding the management of your other chronic comorbid conditions.  Patient's questions and concerns were addressed to his satisfaction. He voices understanding of the instructions provided during this encounter.   This note was created using a voice recognition software as a result there may be grammatical errors inadvertently enclosed that do not reflect the nature of this encounter. Every attempt is made to correct such errors.  Rex Kras, [place], [facility]  Pager: [phone] Office: [phone]

## 2021-01-14 ENCOUNTER — Other Ambulatory Visit: Payer: Self-pay

## 2021-01-14 ENCOUNTER — Ambulatory Visit: Payer: BC Managed Care – PPO | Admitting: Physical Therapy

## 2021-01-16 ENCOUNTER — Ambulatory Visit: Payer: BC Managed Care – PPO | Admitting: Physical Therapy

## 2021-01-16 ENCOUNTER — Other Ambulatory Visit: Payer: Self-pay

## 2021-01-17 ENCOUNTER — Other Ambulatory Visit: Payer: Self-pay | Admitting: Cardiology

## 2021-01-17 DIAGNOSIS — I5031 Acute diastolic (congestive) heart failure: Secondary | ICD-10-CM

## 2021-01-17 LAB — PRO B NATRIURETIC PEPTIDE: NT-Pro BNP: 1789 pg/mL — ABNORMAL HIGH (ref 0–210)

## 2021-01-17 LAB — BASIC METABOLIC PANEL
BUN/Creatinine Ratio: 8 — ABNORMAL LOW (ref 10–24)
BUN: 15 mg/dL (ref 8–27)
CO2: 27 mmol/L (ref 20–29)
Calcium: 9.9 mg/dL (ref 8.6–10.2)
Chloride: 104 mmol/L (ref 96–106)
Creatinine, Ser: 1.96 mg/dL — ABNORMAL HIGH (ref 0.76–1.27)
Glucose: 112 mg/dL — ABNORMAL HIGH (ref 65–99)
Potassium: 4.6 mmol/L (ref 3.5–5.2)
Sodium: 147 mmol/L — ABNORMAL HIGH (ref 134–144)
eGFR: 37 mL/min/{1.73_m2} — ABNORMAL LOW (ref >59)

## 2021-01-17 LAB — MAGNESIUM: Magnesium: 2.1 mg/dL (ref 1.6–2.3)

## 2021-01-19 NOTE — Telephone Encounter (Signed)
I appreciate Dr Leonie Man calling the patient and leaving a message about MRI brain results. CD

## 2021-01-20 ENCOUNTER — Other Ambulatory Visit: Payer: Self-pay | Admitting: Cardiology

## 2021-01-20 DIAGNOSIS — I5031 Acute diastolic (congestive) heart failure: Secondary | ICD-10-CM

## 2021-01-20 MED ORDER — ENTRESTO 49-51 MG PO TABS
1.0000 | ORAL_TABLET | Freq: Two times a day (BID) | ORAL | 0 refills | Status: DC
Start: 2021-01-20 — End: 2021-02-23

## 2021-01-20 NOTE — Progress Notes (Signed)
Spoke to patient he voiced understanding

## 2021-01-21 ENCOUNTER — Encounter: Payer: BC Managed Care – PPO | Admitting: Physical Therapy

## 2021-01-22 ENCOUNTER — Telehealth (INDEPENDENT_AMBULATORY_CARE_PROVIDER_SITE_OTHER): Payer: BC Managed Care – PPO | Admitting: Internal Medicine

## 2021-01-22 ENCOUNTER — Other Ambulatory Visit: Payer: Self-pay

## 2021-01-22 DIAGNOSIS — R7881 Bacteremia: Secondary | ICD-10-CM | POA: Diagnosis not present

## 2021-01-22 DIAGNOSIS — B9561 Methicillin susceptible Staphylococcus aureus infection as the cause of diseases classified elsewhere: Secondary | ICD-10-CM | POA: Diagnosis not present

## 2021-01-22 NOTE — Progress Notes (Signed)
Virtual Visit via Video Note  I connected with [name] on 01/22/21 at  9:00 AM EDT by a video enabled telemedicine application and verified that I am speaking with the correct person using two identifiers.  Location: Patient: Home Provider: [facility]   I discussed the limitations of evaluation and management by telemedicine and the availability of in person appointments. The patient expressed understanding and agreed to proceed.  History of Present Illness: I conducted a video visit with [name] today. He was hospitalized on 11/18/2020 with delirium and sepsis and was found to have MSSA bacteremia.  It was in only 1 of 2 bottles upon admission.  That set of blood cultures also grew staph epidermidis.  Repeat blood cultures the following day were negative and there was no evidence of endocarditis by TTE.  He improved rapidly.  My partner, Dr. Jule Ser, recommended PICC placement and 4 weeks of IV cefazolin through 12/17/2020.  [name] had right shoulder surgery for rotator cuff tear and late February.  He has healed without incident and is undergoing physical therapy.  He feels like he is making progress with his therapy.  He has not had any problems tolerating his cefazolin or his PICC.  He has continued to lose weight since he was started on diuretics.  Repeat TTE on 12/16/2019 showed a normal ejection fraction and no evidence of endocarditis.  He is feeling much better.  He has not had any fever, chills or sweats.  He feels like he is recovered well from his shoulder surgery.  His weight has stayed down and he has not had any further problems with volume overload.   Observations/Objective:   Assessment and Plan: It appears as though his staph bacteremia has been cured.  He had no evidence of any focal infection in his shoulder or endocarditis.  His heart failure has also resolved.  Follow Up Instructions: Continue observation off of antibiotics and follow-up here as needed   I  discussed the assessment and treatment plan with the patient. The patient was provided an opportunity to ask questions and all were answered. The patient agreed with the plan and demonstrated an understanding of the instructions.   The patient was advised to call back or seek an in-person evaluation if the symptoms worsen or if the condition fails to improve as anticipated.  I provided 15 minutes of non-face-to-face time during this encounter.   Michel Bickers, MD

## 2021-01-23 ENCOUNTER — Ambulatory Visit: Payer: BC Managed Care – PPO | Admitting: Physical Therapy

## 2021-01-23 ENCOUNTER — Encounter: Payer: Self-pay | Admitting: Physical Therapy

## 2021-01-23 ENCOUNTER — Ambulatory Visit: Payer: BC Managed Care – PPO

## 2021-01-23 ENCOUNTER — Other Ambulatory Visit: Payer: Self-pay

## 2021-01-23 DIAGNOSIS — M6281 Muscle weakness (generalized): Secondary | ICD-10-CM

## 2021-01-23 DIAGNOSIS — G8929 Other chronic pain: Secondary | ICD-10-CM

## 2021-01-23 DIAGNOSIS — M25511 Pain in right shoulder: Secondary | ICD-10-CM

## 2021-01-23 DIAGNOSIS — R600 Localized edema: Secondary | ICD-10-CM

## 2021-01-23 DIAGNOSIS — M25611 Stiffness of right shoulder, not elsewhere classified: Secondary | ICD-10-CM

## 2021-01-23 DIAGNOSIS — M25612 Stiffness of left shoulder, not elsewhere classified: Secondary | ICD-10-CM

## 2021-01-23 NOTE — Therapy (Signed)
[employer]. [place], [name], [address] Phone: [phone]   Fax:  [phone]  Physical Therapy Treatment/ recertification Progress Note Reporting Period 11/17/20 to 01/23/21  See note below for Objective Data and Assessment of Progress/Goals.       Patient Details  Name: [name] MRN: [id number] Date of Birth: [birth year] Referring Provider (PT): Hedy Camara Date: 01/23/2021   PT End of Session - 01/23/21 0838    Visit Number 10    Number of Visits --    Date for PT Re-Evaluation 02/23/21    PT Start Time 0757    PT Stop Time 0840    PT Time Calculation (min) 43 min    Activity Tolerance Patient tolerated treatment well    Behavior During Therapy [facility] for tasks assessed/performed           Past Medical History:  Diagnosis Date  . Back pain   . CKD (chronic kidney disease), stage III ([place])   . CVA (cerebral infarction)   . Diastolic dysfunction   . ED (erectile dysfunction)   . GERD (gastroesophageal reflux disease)   . Hypercalcemia   . Hyperlipidemia   . Hypertension   . Insomnia   . Long-term use of high-risk medication   . Nephrocalcinosis   . Nephrolithiasis   . Obesity   . Osteopenia   . Pancreatitis   . PE (pulmonary embolism)   . Proteinuria   . Sarcoidosis   . Smoker     Past Surgical History:  Procedure Laterality Date  . ABDOMINAL EXPLORATION SURGERY    . BACK SURGERY    . CHOLECYSTECTOMY    . SHOULDER ARTHROSCOPY Right 10/30/2020   Procedure: ARTHROSCOPY SHOULDER WITH EXTENSIVE DEBRIDEMENT;  Surgeon: Mcarthur Rossetti, MD;  Location: [facility];  Service: Orthopedics;  Laterality: Right;  . SHOULDER SURGERY    . TRACHEOSTOMY     closed    There were no vitals filed for this visit.   Subjective Assessment - 01/23/21 0757    Subjective Doing ok, some pain    Currently in Pain? Yes    Pain Score 4     Pain Location Shoulder    Pain  Orientation Right              [facility] PT Assessment - 01/23/21 0001      AROM   AROM Assessment Site Shoulder    Right/Left Shoulder Right    Right Shoulder Flexion 164 Degrees    Right Shoulder ABduction 175 Degrees    Right Shoulder Internal Rotation 62 Degrees    Right Shoulder External Rotation 90 Degrees      Strength   Strength Assessment Site Shoulder    Right/Left Shoulder Right    Right Shoulder Flexion 4-/5    Right Shoulder ABduction 4/5                         [facility] Adult PT Treatment/Exercise - 01/23/21 0001      Shoulder Exercises: Standing   Horizontal ABduction Strengthening;Both;20 reps;Theraband    Theraband Level (Shoulder Horizontal ABduction) Level 3 (Green)    External Rotation Strengthening;20 reps;Theraband;Right    Theraband Level (Shoulder External Rotation) Level 2 (Red)    Internal Rotation Strengthening;Right;20 reps;Theraband    Theraband Level (Shoulder Internal Rotation) Level 2 (Red)    Flexion Strengthening;Both;20 reps    Shoulder Flexion Weight (lbs) 3  ABduction Strengthening;Both;20 reps;Weights   7 reps second set   Shoulder ABduction Weight (lbs) 3    Extension Strengthening;Both;20 reps;Weights    Extension Weight (lbs) 10    Row Theraband;15 reps;Strengthening;Both   x2   Theraband Level (Shoulder Row) Level 3 (Green)    Other Standing Exercises Tricep Ext 25lb 2x15      Shoulder Exercises: ROM/Strengthening   UBE (Upper Arm Bike) L2 45fd/3back    Other ROM/Strengthening Exercises Rows & Lats 25lb 2x12    Other ROM/Strengthening Exercises Chest press 20lb 2x12                    PT Short Term Goals - 11/28/20 0827      PT SHORT TERM GOAL #1   Title Independent with initial HEP    Status Achieved             PT Long Term Goals - 01/23/21 0840      PT LONG TERM GOAL #1   Title Independent with advanced HEP    Status Achieved      PT LONG TERM GOAL #2   Title Report </= 2/10 R shoulder  pain with ADLs using dominant right side    Status Partially Met      PT LONG TERM GOAL #3   Title R shoulder AROM symmetrical to left    Status Partially Met      PT LONG TERM GOAL #4   Title BUE strength 4+/5    Status On-going                 Plan - 01/23/21 0840    Clinical Impression Statement Progressing towards goals. He has good Shoulder ROM overall, but does have some weakness. Some shoulder elevation noted with active flexion. Tactile cues given to elbows needed with internal and external rotation for positioning. Shoulder did fatigue quick with abduction and horizontal abduction.    Personal Factors and Comorbidities Past/Current Experience;Comorbidity 2;Fitness;Time since onset of injury/illness/exacerbation    Stability/Clinical Decision Making Stable/Uncomplicated    Rehab Potential Good    PT Frequency 2x / week    PT Treatment/Interventions ADLs/Self Care Home Management;Electrical Stimulation;Cryotherapy;Moist Heat;Neuromuscular re-education;Therapeutic exercise;Therapeutic activities;Functional mobility training;Patient/family education;Manual techniques;Energy conservation;Taping;Vasopneumatic Device;Scar mobilization    PT Next Visit Plan continue to progress ther ex and ROM as tolerated and indicated.           Patient will benefit from skilled therapeutic intervention in order to improve the following deficits and impairments:  Decreased range of motion,Impaired UE functional use,Increased muscle spasms,Decreased endurance,Pain,Improper body mechanics,Impaired flexibility,Decreased mobility,Decreased strength,Increased edema  Visit Diagnosis: Stiffness of right shoulder, not elsewhere classified  Muscle weakness (generalized)  Localized edema  Stiffness of left shoulder, not elsewhere classified  Chronic right shoulder pain     Problem List Patient Active Problem List   Diagnosis Date Noted  . Encephalopathy acute 12/22/2020  . Subcortical  microvascular ischemic occlusive disease 12/22/2020  . Chronic intermittent hypoxia with obstructive sleep apnea 12/22/2020  . Mild cognitive impairment 12/22/2020  . Acute combined systolic and diastolic congestive heart failure ([place] 12/22/2020  . Rotator cuff arthropathy, right 12/04/2020  . Volume overload 12/04/2020  . Shortness of breath 12/04/2020  . ARF (acute renal failure) ([place] 11/19/2020  . Acute respiratory failure [place] 11/19/2020  . Bacteremia due to methicillin susceptible Staphylococcus aureus (MSSA) 11/19/2020  . Cranial neuropathy   . Acute encephalopathy 11/18/2020  . Chronic right shoulder pain 06/18/2020  . OSA (obstructive sleep  apnea) 05/05/2020  . Hypersomnia with sleep apnea 12/20/2019  . Psychophysiological insomnia 09/18/2019  . Nocturia more than twice per night 09/18/2019  . Renal hypertension 09/18/2019  . Moderate asthma 09/18/2019  . Tobacco abuse 09/18/2019  . Non-restorative sleep 09/18/2019  . Nontraumatic complete tear of right rotator cuff 08/15/2019  . Delirium 03/16/2019  . Stage 3b chronic kidney disease   . CVA (cerebral infarction)   . Diastolic dysfunction   . Pseudarthrosis 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 02/28/2019  . Intervertebral disc disorder with radiculopathy of lumbar region 09/13/2017  . Acute gouty arthritis 03/07/2017  . Arthralgia of left foot 03/07/2017  . Nephrolithiasis   . Fever, unspecified 11/02/2015  . Essential hypertension 02/07/2015  . Pulmonary embolism ([place]) 01/30/2013  . Sarcoidosis 01/30/2013  . Hemoptysis 01/30/2013  . SHOULDER PAIN, LEFT 11/28/2009    Justin Mend, PTA 01/23/2021, 11:38 AM Sherlynn Stalls, PT, DPT   [facility]. [place], [name], [address] Phone: [phone]   Fax:  [phone]  Name: [name] MRN: [id number] Date of Birth: 1956-02-24

## 2021-01-27 ENCOUNTER — Ambulatory Visit: Payer: BC Managed Care – PPO | Admitting: Physical Therapy

## 2021-01-29 ENCOUNTER — Other Ambulatory Visit: Payer: Self-pay

## 2021-01-29 ENCOUNTER — Ambulatory Visit: Payer: BC Managed Care – PPO

## 2021-01-29 DIAGNOSIS — G8929 Other chronic pain: Secondary | ICD-10-CM

## 2021-01-29 DIAGNOSIS — M25511 Pain in right shoulder: Secondary | ICD-10-CM | POA: Diagnosis not present

## 2021-01-29 DIAGNOSIS — R600 Localized edema: Secondary | ICD-10-CM

## 2021-01-29 DIAGNOSIS — M25612 Stiffness of left shoulder, not elsewhere classified: Secondary | ICD-10-CM

## 2021-01-29 DIAGNOSIS — M25611 Stiffness of right shoulder, not elsewhere classified: Secondary | ICD-10-CM

## 2021-01-29 DIAGNOSIS — M6281 Muscle weakness (generalized): Secondary | ICD-10-CM

## 2021-01-29 NOTE — Therapy (Signed)
[facility]. [place], [name], [address] Phone: [phone]   Fax:  [phone]  Physical Therapy Treatment  Patient Details  Name: [name] MRN: [id number] Date of Birth: 1955/12/06 Referring Provider (PT): Hedy Camara Date: 01/29/2021   PT End of Session - 01/29/21 0800    Visit Number 11    Date for PT Re-Evaluation 02/23/21    PT Start Time 0756    PT Stop Time 0838    PT Time Calculation (min) 42 min    Activity Tolerance Patient tolerated treatment well    Behavior During Therapy [facility] for tasks assessed/performed           Past Medical History:  Diagnosis Date  . Back pain   . CKD (chronic kidney disease), stage III ([place])   . CVA (cerebral infarction)   . Diastolic dysfunction   . ED (erectile dysfunction)   . GERD (gastroesophageal reflux disease)   . Hypercalcemia   . Hyperlipidemia   . Hypertension   . Insomnia   . Long-term use of high-risk medication   . Nephrocalcinosis   . Nephrolithiasis   . Obesity   . Osteopenia   . Pancreatitis   . PE (pulmonary embolism)   . Proteinuria   . Sarcoidosis   . Smoker     Past Surgical History:  Procedure Laterality Date  . ABDOMINAL EXPLORATION SURGERY    . BACK SURGERY    . CHOLECYSTECTOMY    . SHOULDER ARTHROSCOPY Right 10/30/2020   Procedure: ARTHROSCOPY SHOULDER WITH EXTENSIVE DEBRIDEMENT;  Surgeon: Mcarthur Rossetti, MD;  Location: [facility];  Service: Orthopedics;  Laterality: Right;  . SHOULDER SURGERY    . TRACHEOSTOMY     closed    There were no vitals filed for this visit.   Subjective Assessment - 01/29/21 0758    Subjective Shoulder is sore, has been about past week. Did not come to last appointment because of soreness    Currently in Pain? Yes    Pain Score 5     Pain Location Shoulder    Pain Orientation Right    Pain Descriptors / Indicators Sore                              [facility] Adult PT Treatment/Exercise - 01/29/21 0001      Shoulder Exercises: Standing   Horizontal ABduction Strengthening;Both;20 reps;Theraband    Theraband Level (Shoulder Horizontal ABduction) Level 3 (Green)    External Rotation Strengthening;20 reps;Theraband;Right    Theraband Level (Shoulder External Rotation) Level 2 (Red)    Internal Rotation Strengthening;Right;20 reps;Theraband    Theraband Level (Shoulder Internal Rotation) Level 2 (Red)    Flexion Strengthening;Both;20 reps   4 sets of 5   Shoulder Flexion Weight (lbs) 2    ABduction Strengthening;Both;20 reps;Weights   4 sets of 5   Shoulder ABduction Weight (lbs) 2    Extension Strengthening;Both;20 reps;Weights    Theraband Level (Shoulder Extension) Level 3 (Green)    Row [employer] reps;Strengthening;Both   x2   Theraband Level (Shoulder Row) Level 3 (Green)      Shoulder Exercises: ROM/Strengthening   UBE (Upper Arm Bike) L2- 78fd/3back    Lat Pull 20 reps    Lat Pull Limitations 25#    Cybex Row 20 reps    Cybex Row Limitations 25#    Other ROM/Strengthening Exercises Chest press 20lb  x10, x 5, x 4 - shoulder discomfort      Cryotherapy   Number Minutes Cryotherapy 8 Minutes    Cryotherapy Location Shoulder    Type of Cryotherapy Ice pack                  PT Education - 01/29/21 0850    Education Details Updated HEP provided with emphasis on strength. Access Code: [email or web address].    Person(s) Educated Patient    Methods Explanation;Demonstration;Handout    Comprehension Verbalized understanding;Returned demonstration            PT Short Term Goals - 11/28/20 0827      PT SHORT TERM GOAL #1   Title Independent with initial HEP    Status Achieved             PT Long Term Goals - 01/23/21 0840      PT LONG TERM GOAL #1   Title Independent with advanced HEP    Status Achieved      PT LONG TERM GOAL #2   Title Report </= 2/10 R shoulder pain with  ADLs using dominant right side    Status Partially Met      PT LONG TERM GOAL #3   Title R shoulder AROM symmetrical to left    Status Partially Met      PT LONG TERM GOAL #4   Title BUE strength 4+/5    Status On-going                 Plan - 01/29/21 0801    Clinical Impression Statement Continues to have good overall ROM but with continued weakness, mms fatigue at the shoulder. He c/o some soreness at the shoudler so we modified activities for less reps/more sets and modified amount of weight for strength exercises, tolerated this nicely.  We discussed today updated HEP to continue to make strength gains, and reinforced use of ice to help with mms soreness.    Personal Factors and Comorbidities Past/Current Experience;Comorbidity 2;Fitness;Time since onset of injury/illness/exacerbation    Stability/Clinical Decision Making Stable/Uncomplicated    Rehab Potential Good    PT Frequency --   discussed 1-2x/week with increasing HEP for strength   PT Treatment/Interventions ADLs/Self Care Home Management;Electrical Stimulation;Cryotherapy;Moist Heat;Neuromuscular re-education;Therapeutic exercise;Therapeutic activities;Functional mobility training;Patient/family education;Manual techniques;Energy conservation;Taping;Vasopneumatic Device;Scar mobilization    PT Next Visit Plan continue to progress ther ex and ROM as tolerated and indicated.    PT Home Exercise Plan reassess tolerance to updated HEP    Consulted and Agree with Plan of Care Patient           Patient will benefit from skilled therapeutic intervention in order to improve the following deficits and impairments:  Decreased range of motion,Impaired UE functional use,Increased muscle spasms,Decreased endurance,Pain,Improper body mechanics,Impaired flexibility,Decreased mobility,Decreased strength,Increased edema  Visit Diagnosis: Stiffness of right shoulder, not elsewhere classified  Muscle weakness  (generalized)  Localized edema  Stiffness of left shoulder, not elsewhere classified  Chronic right shoulder pain     Problem List Patient Active Problem List   Diagnosis Date Noted  . Encephalopathy acute 12/22/2020  . 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  . Chronic intermittent hypoxia with obstructive sleep apnea 12/22/2020  . Mild cognitive impairment 12/22/2020  . Acute combined systolic and diastolic congestive heart failure ([place]) 12/22/2020  . Rotator cuff arthropathy, right 12/04/2020  . Volume overload 12/04/2020  . Shortness of breath 12/04/2020  . ARF (acute renal failure) ([place]) 11/19/2020  . Acute  respiratory failure ([place]) 11/19/2020  . Bacteremia due to methicillin susceptible Staphylococcus aureus (MSSA) 11/19/2020  . Cranial neuropathy   . Acute encephalopathy 11/18/2020  . Chronic right shoulder pain 06/18/2020  . OSA (obstructive sleep apnea) 05/05/2020  . Hypersomnia with sleep apnea 12/20/2019  . Psychophysiological insomnia 09/18/2019  . Nocturia more than twice per night 09/18/2019  . Renal hypertension 09/18/2019  . Moderate asthma 09/18/2019  . Tobacco abuse 09/18/2019  . Non-restorative sleep 09/18/2019  . Nontraumatic complete tear of right rotator cuff 08/15/2019  . Delirium 03/16/2019  . Stage 3b chronic kidney disease   . CVA (cerebral infarction)   . Diastolic dysfunction   . Pseudarthrosis 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 02/28/2019  . Intervertebral disc disorder with radiculopathy of lumbar region 09/13/2017  . Acute gouty arthritis 03/07/2017  . Arthralgia of left foot 03/07/2017  . Nephrolithiasis   . Fever, unspecified 11/02/2015  . Essential hypertension 02/07/2015  . Pulmonary embolism ([place]) 01/30/2013  . Sarcoidosis 01/30/2013  . Hemoptysis 01/30/2013  . SHOULDER PAIN, LEFT 11/28/2009    Hall Busing, PT, DPT 01/29/2021, 8:53 AM  [facility]. [place], [name], [address] Phone: [phone]   Fax:  [phone]  Name: [name] MRN: [id number] Date of Birth: [birth year]

## 2021-01-29 NOTE — Patient Instructions (Signed)
Access Code: [email or web address] URL: [email or web address] Date: 01/29/2021 Prepared by: Sherlynn Stalls  Program Notes For shoulder exercises with dumbbells, please start with 1-3 lb weights. If you cannot find your dumbbells at home, please use cans or filled water bottles.    Exercises Standing Row with Anchored Resistance - 1 x daily - 5 x weekly - 3 sets - 10 reps Shoulder extension with resistance - Neutral - 1 x daily - 5 x weekly - 3 sets - 10 reps Standing Shoulder Horizontal Abduction with Resistance - 1 x daily - 5 x weekly - 2 sets - 10 reps Shoulder Abduction with Dumbbells - Thumbs Up - 1 x daily - 5 x weekly - 4 sets - 5 reps Standing Shoulder Flexion to 90 Degrees with Dumbbells - 1 x daily - 5 x weekly - 4 sets - 5 reps Seated Bicep Curls Supinated with Dumbbells - 1 x daily - 7 x weekly - 2 sets - 10 reps Seated Elbow Extension with Self-Anchored Resistance - 1 x daily - 7 x weekly - 2 sets - 10 reps Ice - 1 x daily - 7 x weekly - 10-20 minutes hold

## 2021-02-02 ENCOUNTER — Other Ambulatory Visit: Payer: Self-pay | Admitting: Cardiology

## 2021-02-02 DIAGNOSIS — I5031 Acute diastolic (congestive) heart failure: Secondary | ICD-10-CM

## 2021-02-03 ENCOUNTER — Ambulatory Visit: Payer: BC Managed Care – PPO | Admitting: Physical Therapy

## 2021-02-03 ENCOUNTER — Other Ambulatory Visit: Payer: Self-pay

## 2021-02-03 DIAGNOSIS — M6281 Muscle weakness (generalized): Secondary | ICD-10-CM

## 2021-02-03 DIAGNOSIS — M25511 Pain in right shoulder: Secondary | ICD-10-CM | POA: Diagnosis not present

## 2021-02-03 DIAGNOSIS — M25611 Stiffness of right shoulder, not elsewhere classified: Secondary | ICD-10-CM

## 2021-02-03 NOTE — Therapy (Signed)
[place]. [name], [name], [address] Phone: [phone]   Fax:  [phone]  Physical Therapy Treatment  Patient Details  Name: [name] MRN: [id number] Date of Birth: June 03, 1956 Referring Provider (PT): Hedy Camara Date: 02/03/2021   PT End of Session - 02/03/21 0839    Visit Number 12    Date for PT Re-Evaluation 02/23/21    PT Start Time 0800    PT Stop Time 0842    PT Time Calculation (min) 42 min           Past Medical History:  Diagnosis Date  . Back pain   . CKD (chronic kidney disease), stage III ([place])   . CVA (cerebral infarction)   . Diastolic dysfunction   . ED (erectile dysfunction)   . GERD (gastroesophageal reflux disease)   . Hypercalcemia   . Hyperlipidemia   . Hypertension   . Insomnia   . Long-term use of high-risk medication   . Nephrocalcinosis   . Nephrolithiasis   . Obesity   . Osteopenia   . Pancreatitis   . PE (pulmonary embolism)   . Proteinuria   . Sarcoidosis   . Smoker     Past Surgical History:  Procedure Laterality Date  . ABDOMINAL EXPLORATION SURGERY    . BACK SURGERY    . CHOLECYSTECTOMY    . SHOULDER ARTHROSCOPY Right 10/30/2020   Procedure: ARTHROSCOPY SHOULDER WITH EXTENSIVE DEBRIDEMENT;  Surgeon: Mcarthur Rossetti, MD;  Location: [facility];  Service: Orthopedics;  Laterality: Right;  . SHOULDER SURGERY    . TRACHEOSTOMY     closed    There were no vitals filed for this visit.   Subjective Assessment - 02/03/21 6160    Subjective certain mvmt still give me trouble, did not specify -stated "varies". verb doing updated HEP without issue    Currently in Pain? Yes    Pain Score 5     Pain Location Shoulder    Pain Orientation Right                             [facility] Adult PT Treatment/Exercise - 02/03/21 0001      Shoulder Exercises: Seated   Other Seated Exercises 5# row, 3# shld ext and  horz abd 2 sets 10      Shoulder Exercises: Standing   External Rotation Strengthening;Both;20 reps    Internal Rotation Strengthening;Both;20 reps    Diagonals Strengthening;Right;20 reps   3#   Other Standing Exercises 3# 4 pt rhy stab 10 each with rest   pain in W position stopped at 7   Other Standing Exercises Tricep Ext 25lb 2x15   Bicep 15# 2 sets 10     Shoulder Exercises: ROM/Strengthening   UBE (Upper Arm Bike) L3- 33fd/3back    Lat Pull 20 reps    Lat Pull Limitations 25#    Cybex Press 20 reps    Cybex Press Limitations 20# with serratus press    Cybex Row 20 reps    Cybex Row Limitations 25#      Manual Therapy   Manual Therapy Soft tissue mobilization;Joint mobilization;Taping    Joint Mobilization joint capsule stretching    Soft tissue mobilization RT lateral shld    Kinesiotex Create Space;Facilitate Muscle   delt and assiting with posterior pull as pt is very ant  PT Short Term Goals - 11/28/20 0827      PT SHORT TERM GOAL #1   Title Independent with initial HEP    Status Achieved             PT Long Term Goals - 01/23/21 0840      PT LONG TERM GOAL #1   Title Independent with advanced HEP    Status Achieved      PT LONG TERM GOAL #2   Title Report </= 2/10 R shoulder pain with ADLs using dominant right side    Status Partially Met      PT LONG TERM GOAL #3   Title R shoulder AROM symmetrical to left    Status Partially Met      PT LONG TERM GOAL #4   Title BUE strength 4+/5    Status On-going                 Plan - 02/03/21 0839    Clinical Impression Statement progressed stab ex with postural cuing needed. fatigue and pain limiting some ex. STW to lateral shld ,tenderness over delt incertion. trial of KT tape to create space and assist with posture. discussed need to work on keeping shld back as he is very rounded    PT Treatment/Interventions ADLs/Self Care Home Management;Electrical  Stimulation;Cryotherapy;Moist Heat;Neuromuscular re-education;Therapeutic exercise;Therapeutic activities;Functional mobility training;Patient/family education;Manual techniques;Energy conservation;Taping;Vasopneumatic Device;Scar mobilization    PT Next Visit Plan assess tape and progress           Patient will benefit from skilled therapeutic intervention in order to improve the following deficits and impairments:  Decreased range of motion,Impaired UE functional use,Increased muscle spasms,Decreased endurance,Pain,Improper body mechanics,Impaired flexibility,Decreased mobility,Decreased strength,Increased edema  Visit Diagnosis: Stiffness of right shoulder, not elsewhere classified  Muscle weakness (generalized)     Problem List Patient Active Problem List   Diagnosis Date Noted  . Encephalopathy acute 12/22/2020  . 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  . Chronic intermittent hypoxia with obstructive sleep apnea 12/22/2020  . Mild cognitive impairment 12/22/2020  . Acute combined systolic and diastolic congestive heart failure ([place]) 12/22/2020  . Rotator cuff arthropathy, right 12/04/2020  . Volume overload 12/04/2020  . Shortness of breath 12/04/2020  . ARF (acute renal failure) ([place]) 11/19/2020  . Acute respiratory failure ([place]) 11/19/2020  . Bacteremia due to methicillin susceptible Staphylococcus aureus (MSSA) 11/19/2020  . Cranial neuropathy   . Acute encephalopathy 11/18/2020  . Chronic right shoulder pain 06/18/2020  . OSA (obstructive sleep apnea) 05/05/2020  . Hypersomnia with sleep apnea 12/20/2019  . Psychophysiological insomnia 09/18/2019  . Nocturia more than twice per night 09/18/2019  . Renal hypertension 09/18/2019  . Moderate asthma 09/18/2019  . Tobacco abuse 09/18/2019  . Non-restorative sleep 09/18/2019  . Nontraumatic complete tear of right rotator cuff 08/15/2019  . Delirium 03/16/2019  . Stage 3b chronic kidney disease    . CVA (cerebral infarction)   . Diastolic dysfunction   . Pseudarthrosis 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 02/28/2019  . Intervertebral disc disorder with radiculopathy of lumbar region 09/13/2017  . Acute gouty arthritis 03/07/2017  . Arthralgia of left foot 03/07/2017  . Nephrolithiasis   . Fever, unspecified 11/02/2015  . Essential hypertension 02/07/2015  . Pulmonary embolism ([place]) 01/30/2013  . Sarcoidosis 01/30/2013  . Hemoptysis 01/30/2013  . SHOULDER PAIN, LEFT 11/28/2009    Eriyah Fernando,ANGIE PTA 02/03/2021, 8:44 AM  [place]. [place], [name], [address] Phone: [phone]  Fax:  [phone]  Name: SAHEED CARRINGTON MRN: [id number] Date of Birth: 09-Jan-1956

## 2021-02-03 NOTE — Progress Notes (Signed)
Spoke to patient he went today to get labs

## 2021-02-04 LAB — BASIC METABOLIC PANEL
BUN/Creatinine Ratio: 8 — ABNORMAL LOW (ref 10–24)
BUN: 15 mg/dL (ref 8–27)
CO2: 25 mmol/L (ref 20–29)
Calcium: 9.1 mg/dL (ref 8.6–10.2)
Chloride: 105 mmol/L (ref 96–106)
Creatinine, Ser: 1.81 mg/dL — ABNORMAL HIGH (ref 0.76–1.27)
Glucose: 108 mg/dL — ABNORMAL HIGH (ref 65–99)
Potassium: 4.4 mmol/L (ref 3.5–5.2)
Sodium: 144 mmol/L (ref 134–144)
eGFR: 41 mL/min/{1.73_m2} — ABNORMAL LOW (ref >59)

## 2021-02-04 LAB — PRO B NATRIURETIC PEPTIDE: NT-Pro BNP: 733 pg/mL — ABNORMAL HIGH (ref 0–210)

## 2021-02-04 LAB — MAGNESIUM: Magnesium: 2.1 mg/dL (ref 1.6–2.3)

## 2021-02-05 ENCOUNTER — Ambulatory Visit: Payer: BC Managed Care – PPO | Attending: Orthopaedic Surgery

## 2021-02-06 NOTE — Progress Notes (Signed)
Spoke to patient he voiced understanding he is taking Entresto 49-51 bid and stated his bp has been in the 135/90

## 2021-02-11 ENCOUNTER — Other Ambulatory Visit: Payer: Self-pay

## 2021-02-11 ENCOUNTER — Ambulatory Visit: Payer: BC Managed Care – PPO | Admitting: Physical Therapy

## 2021-02-11 DIAGNOSIS — N179 Acute kidney failure, unspecified: Secondary | ICD-10-CM

## 2021-02-11 DIAGNOSIS — I10 Essential (primary) hypertension: Secondary | ICD-10-CM

## 2021-02-11 DIAGNOSIS — M7989 Other specified soft tissue disorders: Secondary | ICD-10-CM

## 2021-02-11 DIAGNOSIS — I5031 Acute diastolic (congestive) heart failure: Secondary | ICD-10-CM

## 2021-02-11 NOTE — Progress Notes (Signed)
Spoke to patient he is aware to keep a bp log and to hold bidil for now per ST

## 2021-02-13 DIAGNOSIS — I10 Essential (primary) hypertension: Secondary | ICD-10-CM | POA: Diagnosis not present

## 2021-02-14 LAB — BASIC METABOLIC PANEL
BUN/Creatinine Ratio: 14 (ref 10–24)
BUN: 25 mg/dL (ref 8–27)
CO2: 26 mmol/L (ref 20–29)
Calcium: 9.3 mg/dL (ref 8.6–10.2)
Chloride: 101 mmol/L (ref 96–106)
Creatinine, Ser: 1.78 mg/dL — ABNORMAL HIGH (ref 0.76–1.27)
Glucose: 117 mg/dL — ABNORMAL HIGH (ref 65–99)
Potassium: 4.1 mmol/L (ref 3.5–5.2)
Sodium: 140 mmol/L (ref 134–144)
eGFR: 42 mL/min/{1.73_m2} — ABNORMAL LOW (ref >59)

## 2021-02-14 LAB — BRAIN NATRIURETIC PEPTIDE: BNP: 242.7 pg/mL — ABNORMAL HIGH (ref 0.0–100.0)

## 2021-02-14 LAB — MAGNESIUM: Magnesium: 2.4 mg/dL — ABNORMAL HIGH (ref 1.6–2.3)

## 2021-02-15 ENCOUNTER — Other Ambulatory Visit: Payer: Self-pay

## 2021-02-15 ENCOUNTER — Ambulatory Visit
Admission: EM | Admit: 2021-02-15 | Discharge: 2021-02-15 | Disposition: A | Discharge status: Home / Self Care | Payer: BC Managed Care – PPO | Attending: Emergency Medicine | Admitting: Emergency Medicine

## 2021-02-15 ENCOUNTER — Encounter: Payer: Self-pay | Admitting: Emergency Medicine

## 2021-02-15 DIAGNOSIS — H1033 Unspecified acute conjunctivitis, bilateral: Secondary | ICD-10-CM

## 2021-02-15 MED ORDER — POLYMYXIN B-TRIMETHOPRIM 10000-0.1 UNIT/ML-% OP SOLN: 1.0000 [drp] | Freq: Four times a day (QID) | OPHTHALMIC | 0 refills | Status: AC

## 2021-02-15 NOTE — ED Triage Notes (Signed)
Pt here for bilateral eye redness with itching and draining x 4 days

## 2021-02-15 NOTE — ED Provider Notes (Signed)
[facility]    CSN: [id number] Arrival date & time: 02/15/21  0827      History   Chief Complaint Chief Complaint  Patient presents with   Conjunctivitis    HPI [name] is a 65 y.o. male history of CKD stage III, GERD, hypertension, presenting today for evaluation of eye redness.  Reports over the past 4 days has had eye itching redness and thick pustular drainage.  Reports some mild blurring, but otherwise no changes in vision.  Denies eye pain.  Denies photophobia.  Denies injury or trauma.  Denies contact use.  Denies associated URI symptoms.  Symptoms started in left eye and spread to right  HPI  Past Medical History:  Diagnosis Date   Back pain    CKD (chronic kidney disease), stage III ([facility])    CVA (cerebral infarction)    Diastolic dysfunction    ED (erectile dysfunction)    GERD (gastroesophageal reflux disease)    Hypercalcemia    Hyperlipidemia    Hypertension    Insomnia    Long-term use of high-risk medication    Nephrocalcinosis    Nephrolithiasis    Obesity    Osteopenia    Pancreatitis    PE (pulmonary embolism)    Proteinuria    Sarcoidosis    Smoker     Patient Active Problem List   Diagnosis Date Noted   Encephalopathy acute 12/22/2020   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   Chronic intermittent hypoxia with obstructive sleep apnea 12/22/2020   Mild cognitive impairment 12/22/2020   Acute combined systolic and diastolic congestive heart failure ([place]) 12/22/2020   Rotator cuff arthropathy, right 12/04/2020   Volume overload 12/04/2020   Shortness of breath 12/04/2020   ARF (acute renal failure) ([place]) 11/19/2020   Acute respiratory failure ([place]) 11/19/2020   Bacteremia due to methicillin susceptible Staphylococcus aureus (MSSA) 11/19/2020   Cranial neuropathy    Acute encephalopathy 11/18/2020   Chronic right shoulder pain 06/18/2020   OSA (obstructive sleep apnea) 05/05/2020   Hypersomnia  with sleep apnea 12/20/2019   Psychophysiological insomnia 09/18/2019   Nocturia more than twice per night 09/18/2019   Renal hypertension 09/18/2019   Moderate asthma 09/18/2019   Tobacco abuse 09/18/2019   Non-restorative sleep 09/18/2019   Nontraumatic complete tear of right rotator cuff 08/15/2019   Delirium 03/16/2019   Stage 3b chronic kidney disease    CVA (cerebral infarction)    Diastolic dysfunction    Pseudarthrosis 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 02/28/2019   Intervertebral disc disorder with radiculopathy of lumbar region 09/13/2017   Acute gouty arthritis 03/07/2017   Arthralgia of left foot 03/07/2017   Nephrolithiasis    Fever, unspecified 11/02/2015   Essential hypertension 02/07/2015   Pulmonary embolism ([place]) 01/30/2013   Sarcoidosis 01/30/2013   Hemoptysis 01/30/2013   SHOULDER PAIN, LEFT 11/28/2009    Past Surgical History:  Procedure Laterality Date   ABDOMINAL EXPLORATION SURGERY     BACK SURGERY     CHOLECYSTECTOMY     SHOULDER ARTHROSCOPY Right 10/30/2020   Procedure: ARTHROSCOPY SHOULDER WITH EXTENSIVE DEBRIDEMENT;  Surgeon: Mcarthur Rossetti, MD;  Location: [facility];  Service: Orthopedics;  Laterality: Right;   SHOULDER SURGERY     TRACHEOSTOMY     closed       Home Medications    Prior to Admission medications   Medication Sig Start Date End Date Taking? Authorizing Provider  trimethoprim-polymyxin b (POLYTRIM) ophthalmic solution Place 1-2 drops into  both eyes every 6 (six) hours for 7 days. 02/15/21 02/22/21 Yes Shell Blanchette C, PA-C  aspirin 81 MG tablet Take 1 tablet (81 mg total) by mouth daily. 02/01/13   [name], MD  atorvastatin (LIPITOR) 20 MG tablet Take 20 mg by mouth daily. 02/02/19   [provider]  BLACK ELDERBERRY PO Take 2 each by mouth daily as needed (immune support). Gummies    [provider]  cetirizine (ZYRTEC) 10 MG tablet Take 1 tablet by mouth as needed. 03/17/15   [provider]  diltiazem (CARDIZEM CD) 360 MG 24 hr capsule Take 360 mg by mouth daily.    [provider]  diphenhydrAMINE (BENADRYL ALLERGY) 25 mg capsule Take 25 mg by mouth every 6 (six) hours as needed.    [provider]  fluticasone (FLONASE) 50 MCG/ACT nasal spray Place 2 sprays into both nostrils daily as needed for allergies or rhinitis.    [provider]  isosorbide-hydrALAZINE (BIDIL) 20-37.5 MG tablet Take 2 tablets by mouth 3 (three) times daily.    [provider]  KLOR-CON M20 20 MEQ tablet Take 20 mEq by mouth daily. 12/08/20   [provider]  labetalol (NORMODYNE) 300 MG tablet Take 300 mg by mouth 3 (three) times daily.    [provider]  MELATONIN PO Take 12.5 mg by mouth at bedtime as needed (sleep).    [provider]  predniSONE (DELTASONE) 5 MG tablet Take 5 mg by mouth daily. Reported on 11/02/2015    [provider]  sacubitril-valsartan (ENTRESTO) 49-51 MG Take 1 tablet by mouth 2 (two) times daily. 01/20/21 04/20/21  Tolia, Sunit, DO  tamsulosin (FLOMAX) 0.4 MG CAPS capsule Take 0.4 mg by mouth daily. 11/01/18   [provider]  TIADYLT ER 360 MG 24 hr capsule Take 360 mg by mouth daily. 12/21/20   [provider]  traZODone (DESYREL) 150 MG tablet Take 1 tablet (150 mg total) by mouth at bedtime as needed for sleep. 12/22/20   Dohmeier, Asencion Partridge, MD    Family History Family History  Problem Relation Age of Onset   Hypertension Father    CVA Father    Lung cancer Father    Alzheimer's disease Mother    Hypertension Sister    Heart failure Sister     Social History Social History   Tobacco Use   Smoking status: Former    Packs/day: 0.25    Years: 15.00    Pack years: 3.75    Types: Cigarettes    Start date: 11/27/2020    Quit date: 12/30/2020    Years since quitting: 0.1   Smokeless tobacco: Never  Vaping Use   Vaping Use: Never used  Substance Use Topics   Alcohol  use: No    Alcohol/week: 0.0 standard drinks   Drug use: No     Allergies   Codeine and Hydromorphone   Review of Systems Review of Systems  Constitutional:  Negative for activity change, appetite change, chills, fatigue and fever.  HENT:  Negative for congestion, ear pain, rhinorrhea, sinus pressure, sore throat and trouble swallowing.   Eyes:  Positive for discharge, redness and itching.  Respiratory:  Negative for cough, chest tightness and shortness of breath.   Cardiovascular:  Negative for chest pain.  Gastrointestinal:  Negative for abdominal pain, diarrhea, nausea and vomiting.  Musculoskeletal:  Negative for myalgias.  Skin:  Negative for rash.  Neurological:  Negative for dizziness, light-headedness and headaches.  Physical Exam Triage Vital Signs ED Triage Vitals  Enc Vitals Group     BP      Pulse      Resp      Temp      Temp src      SpO2      Weight      Height      Head Circumference      Peak Flow      Pain Score      Pain Loc      Pain Edu?      Excl. in [place]?    No data found.  Updated Vital Signs BP (!) 134/91 (BP Location: Left Arm)   Pulse 67   Temp 98.1 F (36.7 C) (Oral)   Resp 20   SpO2 95%   Visual Acuity Right Eye Distance:   Left Eye Distance:   Bilateral Distance:    Right Eye Near:   Left Eye Near:    Bilateral Near:     Physical Exam Vitals and nursing note reviewed.  Constitutional:      Appearance: He is well-developed.     Comments: No acute distress  HENT:     Head: Normocephalic and atraumatic.     Ears:     Comments: Bilateral ears without tenderness to palpation of external auricle, tragus and mastoid, EAC's without erythema or swelling, TM's with good bony landmarks and cone of light. Non erythematous.      Nose: Nose normal.     Mouth/Throat:     Comments: Oral mucosa pink and moist, no tonsillar enlargement or exudate. Posterior pharynx patent and nonerythematous, no uvula deviation or swelling. Normal  phonation.  Eyes:     Extraocular Movements: Extraocular movements intact.     Pupils: Pupils are equal, round, and reactive to light.     Comments: Bilateral eyes with mild upper lid swelling without erythema or induration, localized to around lash line, bilateral conjunctiva erythematous with thick pustular drainage noted within lids and on lashes, no photophobia with exam, anterior chamber clear  Cardiovascular:     Rate and Rhythm: Normal rate.  Pulmonary:     Effort: Pulmonary effort is normal. No respiratory distress.  Abdominal:     General: There is no distension.  Musculoskeletal:        General: Normal range of motion.     Cervical back: Neck supple.  Skin:    General: Skin is warm and dry.  Neurological:     Mental Status: He is alert and oriented to person, place, and time.     UC Treatments / Results  Labs (all labs ordered are listed, but only abnormal results are displayed) Labs Reviewed - No data to display  EKG   Radiology No results found.  Procedures Procedures (including critical care time)  Medications Ordered in UC Medications - No data to display  Initial Impression / Assessment and Plan / UC Course  I have reviewed the triage vital signs and the nursing notes.  Pertinent labs & imaging results that were available during my care of the patient were reviewed by me and considered in my medical decision making (see chart for details).     Bacterial conjunctivitis-Polytrim eyedrops x1 week, warm compresses, advised may continue to use over-the-counter allergy eyedrops help with itching.  No red flags, no change in vision.  No signs of surrounding cellulitis.  Discussed strict return precautions. Patient verbalized understanding and is agreeable with plan.  Final  Clinical Impressions(s) / UC Diagnoses   Final diagnoses:  Acute bacterial conjunctivitis of both eyes     Discharge Instructions      Begin Polytrim eyedrops 4 times daily Warm  compresses to eye with supple swelling and removing crusting/drainage May continue Opcon-A eyedrops as needed for itching/burning Follow-up if not improving or worsening     ED Prescriptions     Medication Sig Dispense Auth. Provider   trimethoprim-polymyxin b (POLYTRIM) ophthalmic solution Place 1-2 drops into both eyes every 6 (six) hours for 7 days. 10 mL Javone Ybanez, [place] C, PA-C      PDMP not reviewed this encounter.   Pearley Baranek, [place] C, PA-C 02/15/21 1000

## 2021-02-15 NOTE — Discharge Instructions (Addendum)
Begin Polytrim eyedrops 4 times daily Warm compresses to eye with supple swelling and removing crusting/drainage May continue Opcon-A eyedrops as needed for itching/burning Follow-up if not improving or worsening

## 2021-02-16 NOTE — Progress Notes (Signed)
Called pt to inform him about his lab results, pt understood

## 2021-02-17 ENCOUNTER — Ambulatory Visit: Payer: BC Managed Care – PPO | Admitting: Physical Therapy

## 2021-02-20 DIAGNOSIS — E785 Hyperlipidemia, unspecified: Secondary | ICD-10-CM | POA: Diagnosis not present

## 2021-02-20 DIAGNOSIS — Z125 Encounter for screening for malignant neoplasm of prostate: Secondary | ICD-10-CM | POA: Diagnosis not present

## 2021-02-20 DIAGNOSIS — Z0000 Encounter for general adult medical examination without abnormal findings: Secondary | ICD-10-CM | POA: Diagnosis not present

## 2021-02-20 DIAGNOSIS — R7301 Impaired fasting glucose: Secondary | ICD-10-CM | POA: Diagnosis not present

## 2021-02-20 DIAGNOSIS — H04553 Acquired stenosis of bilateral nasolacrimal duct: Secondary | ICD-10-CM | POA: Diagnosis not present

## 2021-02-23 ENCOUNTER — Encounter: Payer: Self-pay | Admitting: Cardiology

## 2021-02-23 ENCOUNTER — Ambulatory Visit: Payer: BC Managed Care – PPO | Admitting: Cardiology

## 2021-02-23 ENCOUNTER — Other Ambulatory Visit: Payer: Self-pay

## 2021-02-23 VITALS — BP 154/92 | HR 62 | Temp 97.4°F | Resp 16 | Ht 72.0 in | Wt 223.4 lb

## 2021-02-23 DIAGNOSIS — Z86711 Personal history of pulmonary embolism: Secondary | ICD-10-CM | POA: Diagnosis not present

## 2021-02-23 DIAGNOSIS — E782 Mixed hyperlipidemia: Secondary | ICD-10-CM

## 2021-02-23 DIAGNOSIS — N179 Acute kidney failure, unspecified: Secondary | ICD-10-CM | POA: Diagnosis not present

## 2021-02-23 DIAGNOSIS — R0609 Other forms of dyspnea: Secondary | ICD-10-CM | POA: Diagnosis not present

## 2021-02-23 DIAGNOSIS — R0600 Dyspnea, unspecified: Secondary | ICD-10-CM

## 2021-02-23 DIAGNOSIS — G4733 Obstructive sleep apnea (adult) (pediatric): Secondary | ICD-10-CM

## 2021-02-23 DIAGNOSIS — I10 Essential (primary) hypertension: Secondary | ICD-10-CM

## 2021-02-23 DIAGNOSIS — Z720 Tobacco use: Secondary | ICD-10-CM

## 2021-02-23 DIAGNOSIS — I5031 Acute diastolic (congestive) heart failure: Secondary | ICD-10-CM

## 2021-02-23 MED ORDER — ENTRESTO 49-51 MG PO TABS: 2.0000 | ORAL_TABLET | Freq: Two times a day (BID) | ORAL | 0 refills | Status: DC

## 2021-02-23 NOTE — Progress Notes (Signed)
Date:  02/23/2021   ID:  MAHAMADOU WELTZ, DOB 09/07/1955, MRN [id number]  PCP:  [name], MD  Cardiologist: Rex Kras, DO, [facility] (established care 12/10/2020)  Date: 02/23/21 Last Office Visit: 01/08/2021  Chief Complaint  Patient presents with   Heart failure management   Follow-up    4 weeks    HPI  [name] is a 65 y.o. male who presents to the office with a chief complaint of " 1 month follow up for heart failure." Patient's past medical history and cardiovascular risk factors include: Sarcoidosis, HTN, Tobacco abuse, Hx of PE (was on Xarelto for 1 year), OSA not on CPAP, MSSA Bacteremia (March 2022), Hx of cardiac arrest (2002 during his hospitalization for pancreatitis per wife).  He is referred to the office at the request of [name], MD for evaluation of heart failure.  Patient underwent right shoulder repair surgery in February 2022 and subsequently was hospitalized for altered mental status and diagnosed with MSSA bacteremia treated with IV antibiotics via PICC line.  During his hospitalization back in March 2022 he started experiencing shortness of breath, fluid overload, lower extremity swelling, orthopnea, PND.  He was referred to cardiology for heart failure management.  Since then patient's medications have been uptitrated slowly given his renal function and has achieved significant diuresis and is currently euvolemic.  He now presents for follow-up.  Patient states that his symptoms of shortness of breath with effort related activities have improved by at least 90% or better.  He is back to baseline.  He enjoys running around with his grandkids without becoming symptomatic.  Since last office visit patient's weight has remained relatively stable.  His home systolic blood pressures are ranging between 140-150 mmHg.  Most recent labs from 02/13/2021 independently reviewed.  And he is scheduled for a sleep study on Wednesday, 02/25/2021.  FUNCTIONAL  STATUS: No structured exercise program or daily routine.   ALLERGIES: Allergies  Allergen Reactions   Codeine Nausea Only   Hydromorphone Other (See Comments)    hallucinations     MEDICATION LIST PRIOR TO VISIT: Current Meds  Medication Sig   aspirin 81 MG tablet Take 1 tablet (81 mg total) by mouth daily.   atorvastatin (LIPITOR) 20 MG tablet Take 20 mg by mouth daily.   BLACK ELDERBERRY PO Take 2 each by mouth daily as needed (immune support). Gummies   diltiazem (CARDIZEM CD) 360 MG 24 hr capsule Take 360 mg by mouth daily.   diphenhydrAMINE (BENADRYL) 25 mg capsule Take 25 mg by mouth every 6 (six) hours as needed.   fluticasone (FLONASE) 50 MCG/ACT nasal spray Place 2 sprays into both nostrils daily as needed for allergies or rhinitis.   KLOR-CON M20 20 MEQ tablet Take 20 mEq by mouth daily.   labetalol (NORMODYNE) 300 MG tablet Take 300 mg by mouth 3 (three) times daily.   predniSONE (DELTASONE) 5 MG tablet Take 5 mg by mouth daily. Reported on 11/02/2015   tamsulosin (FLOMAX) 0.4 MG CAPS capsule Take 0.4 mg by mouth daily.   TIADYLT ER 360 MG 24 hr capsule Take 360 mg by mouth daily.   traZODone (DESYREL) 150 MG tablet Take 1 tablet (150 mg total) by mouth at bedtime as needed for sleep.   [DISCONTINUED] sacubitril-valsartan (ENTRESTO) 49-51 MG Take 1 tablet by mouth 2 (two) times daily.     PAST MEDICAL HISTORY: Past Medical History:  Diagnosis Date   Back pain    CKD (chronic kidney disease),  stage III (HCC)    CVA (cerebral infarction)    Diastolic dysfunction    ED (erectile dysfunction)    GERD (gastroesophageal reflux disease)    Hypercalcemia    Hyperlipidemia    Hypertension    Insomnia    Long-term use of high-risk medication    Nephrocalcinosis    Nephrolithiasis    Obesity    Osteopenia    Pancreatitis    PE (pulmonary embolism)    Proteinuria    Sarcoidosis    Smoker   Patient and wife denies hx of CVA.   PAST SURGICAL HISTORY: Past Surgical  History:  Procedure Laterality Date   ABDOMINAL EXPLORATION SURGERY     BACK SURGERY     CHOLECYSTECTOMY     SHOULDER ARTHROSCOPY Right 10/30/2020   Procedure: ARTHROSCOPY SHOULDER WITH EXTENSIVE DEBRIDEMENT;  Surgeon: Mcarthur Rossetti, MD;  Location: [facility];  Service: Orthopedics;  Laterality: Right;   SHOULDER SURGERY     TRACHEOSTOMY     closed    FAMILY HISTORY: The patient family history includes Alzheimer's disease in his mother; CVA in his father; Heart failure in his sister; Hypertension in his father and sister; Lung cancer in his father.  SOCIAL HISTORY:  The patient  reports that he quit smoking about 8 weeks ago. His smoking use included cigarettes. He started smoking about 2 months ago. He has a 3.75 pack-year smoking history. He has never used smokeless tobacco. He reports that he does not drink alcohol and does not use drugs.  REVIEW OF SYSTEMS: Review of Systems  Constitutional: Positive for malaise/fatigue (improved) and weight loss. Negative for chills, fever and weight gain.  HENT:  Negative for hoarse voice and nosebleeds.   Eyes:  Negative for discharge, double vision and pain.  Cardiovascular:  Negative for chest pain, claudication, dyspnea on exertion, leg swelling, near-syncope, orthopnea, palpitations, paroxysmal nocturnal dyspnea and syncope.  Respiratory:  Negative for hemoptysis and shortness of breath.   Musculoskeletal:  Negative for muscle cramps and myalgias.  Gastrointestinal:  Negative for abdominal pain, constipation, diarrhea, hematemesis, hematochezia, melena, nausea and vomiting.  Neurological:  Negative for dizziness and light-headedness.   PHYSICAL EXAM: Vitals with BMI 02/23/2021 02/23/2021 02/15/2021  Height - _0  -  Weight - 223 lbs 6 oz -  BMI - 52.77 -  Systolic 824 235 361  Diastolic 92 92 91  Pulse 62 57 67    CONSTITUTIONAL: Appears older than stated age, well-developed and well-nourished. No acute  distress.  SKIN: Skin is warm and dry. No rash noted. No cyanosis. No pallor. No jaundice HEAD: Normocephalic and atraumatic.  EYES: No scleral icterus MOUTH/THROAT: Moist oral membranes.  NECK: No JVD present. No thyromegaly noted. No carotid bruits  LYMPHATIC: No visible cervical adenopathy.  CHEST Normal respiratory effort. No intercostal retractions  LUNGS: Clear to auscultation bilaterally.  No stridor. No wheezes. No rales.  CARDIOVASCULAR: Regular, positive W4-R1, soft holosystolic murmur heard at the apex, no gallops or rubs. ABDOMINAL: Obese, soft soft, nontender, distended, positive bowel sounds in all 4 quadrants, no apparent ascites.  EXTREMITIES: Trace bilateral pitting edema, no open wounds or ulcers.   HEMATOLOGIC: No significant bruising NEUROLOGIC: Oriented to person, place, and time. Nonfocal. Normal muscle tone.  PSYCHIATRIC: Normal mood and affect. Normal behavior. Cooperative  CARDIAC DATABASE: EKG: 12/18/2020: Sinus bradycardia, 58 bpm, left atrial enlargement, diffuse T wave inversions.  No significant change compared to prior ECG.  Echocardiogram: 12/15/2020:  Normal LV systolic function with  visual EF 55-60%.  Left ventricle cavity is normal in size.  Moderate left ventricular hypertrophy. Normal global wall motion.  Indeterminate diastolic filling pattern, normal LAP.  Left atrial cavity is mildly dilated.  Mild (Grade I) aortic regurgitation.  Mild (Grade I) mitral regurgitation.  Mild tricuspid regurgitation. No evidence of pulmonary hypertension.  IVC is dilated with a respiratory response of >50%.  Prior study 11/19/2020: LVEF 60-65%, Grade I DD, Mild LAE, Trivial MR, RAP 6mHg.  Stress Testing: No results found for this or any previous visit from the past 1095 days.   Heart Catheterization: None  LABORATORY DATA: CBC Latest Ref Rng & Units 11/21/2020 11/20/2020 11/19/2020  WBC 4.0 - 10.5 K/uL 6.4 6.4 7.0  Hemoglobin 13.0 - 17.0 g/dL 12.0(L)  11.9(L) 12.4(L)  Hematocrit 39.0 - 52.0 % 36.2(L) 35.5(L) 39.4  Platelets 150 - 400 K/uL 204 200 208    CMP Latest Ref Rng & Units 02/13/2021 02/03/2021 01/16/2021  Glucose 65 - 99 mg/dL 117(H) 108(H) 112(H)  BUN 8 - 27 mg/dL _0 Creatinine 0.76 - 1.27 mg/dL 1.78(H) 1.81(H) 1.96(H)  Sodium 134 - 144 mmol/L 140 144 147(H)  Potassium 3.5 - 5.2 mmol/L 4.1 4.4 4.6  Chloride 96 - 106 mmol/L 101 105 104  CO2 20 - 29 mmol/L _1 Calcium 8.6 - 10.2 mg/dL 9.3 9.1 9.9  Total Protein 6.5 - 8.1 g/dL - - -  Total Bilirubin 0.3 - 1.2 mg/dL - - -  Alkaline Phos 38 - 126 U/L - - -  AST 15 - 41 U/L - - -  ALT 0 - 44 U/L - - -    Lipid Panel     Component Value Date/Time   CHOL 123 03/15/2019 0425   TRIG 130 03/15/2019 0425   HDL 32 (L) 03/15/2019 0425   CHOLHDL 3.8 03/15/2019 0425   VLDL 26 03/15/2019 0425   LDLCALC 65 03/15/2019 0425    No components found for: NTPROBNP Recent Labs    12/15/20 0909 01/02/21 0752 01/16/21 0713 02/03/21 0717  PROBNP 4,473* 2,337* 1,789* 733*   Recent Labs    11/19/20 0233  TSH 1.247    BMP Recent Labs    11/20/20 0706 11/21/20 0302 11/22/20 0140 12/15/20 0909 01/16/21 0713 02/03/21 0720 02/13/21 0732  NA 142 140 138   < > 147* 144 140  K 4.2 3.2* 3.1*   < > 4.6 4.4 4.1  CL 103 103 102   < > 104 105 101  CO2 _2 < > _3 GLUCOSE 110* 97 87   < > 112* 108* 117*  BUN 14 6* 8   < > _4 CREATININE 2.02* 1.66* 1.53*   < > 1.96* 1.81* 1.78*  CALCIUM 8.6* 8.8* 8.6*   < > 9.9 9.1 9.3  GFRNONAA 36* 46* 51*  --   --   --   --    < > = values in this interval not displayed.    HEMOGLOBIN A1C Lab Results  Component Value Date   HGBA1C 5.8 (H) 03/15/2019   MPG 120 03/15/2019    External Labs: Collected: 12/08/2020 Creatinine 1.7 mg/dL. (Serum creatinine 1.6 mg/dL collected 07/11/2020) eGFR: 40.9 mL/min per 1.73 m Sodium 143, potassium 3.5, chloride 104, bicarb 32 Hemoglobin 12.6, hematocrit 38.2 NT proBNP  5576  IMPRESSION:    ICD-10-CM   1. AKI (acute kidney injury) ([place]  N17.9     2. Acute  heart failure with preserved ejection fraction (HFpEF) (HCC)  I50.31 sacubitril-valsartan (ENTRESTO) 49-51 MG    Basic metabolic panel    Magnesium    Pro b natriuretic peptide (BNP)    3. Mixed hyperlipidemia  E78.2     4. Dyspnea on exertion  R06.00     5. Tobacco abuse  Z72.0     6. OSA (obstructive sleep apnea)  G47.33     7. Hx pulmonary embolism  Z86.711     8. Essential hypertension  I10        RECOMMENDATIONS: PRESTYN STANCO is a 65 y.o. male whose past medical history and cardiac risk factors include: Sarcoidosis, HTN, Tobacco abuse, Hx of PE (was on Xarelto for 1 year), OSA not on CPAP, MSSA Bacteremia (March 2022), Hx of cardiac arrest (2002 during his hospitalization for pancreatitis per wife).  Chronic heart failure with preserved EF, NYHA class II: Improving. Weight remains relatively stable. Patient's blood pressures could be better controlled. Patient has Entresto 49/51 mg p.o. tablets that were just filled.  For now I have asked him to take 2 tablets in the morning and 2 tablets in the evening.  Blood work in 1 week to evaluate kidney function and electrolytes. If he is tolerating that dose I will send in a prescription for Entresto 97/103 mg p.o. twice daily Patient is asked to keep a log of his blood pressures and his weight. If he has worsening shortness of breath, lower extremity swelling, or systolic blood pressure consistently greater than 140 mmHg he is asked to call the office for further medication titration Patient has an appointment for sleep study this coming Wednesday. Labs from 5/13, 5/31, and 6/10 independently reviewed with the patient at today's visit. Sleep study scheduled 02/25/2021, per patient Medications reconciled.  Acute kidney injury: Improving. Discontinued Bumex, as he is euvolemic. Had held BiDil to prevent hypotension which may be  contributing to his AKI. Medication changes as discussed above Avoid nephrotoxic agents. Avoid NSAIDs Motrins Naprosyn ibuprofen.  Benign essential hypertension: Office blood pressure can be better controlled. Medications reconciled-see above for medication changes Low-salt diet recommended. We will monitor   FINAL MEDICATION LIST END OF ENCOUNTER: Meds ordered this encounter  Medications   sacubitril-valsartan (ENTRESTO) 49-51 MG    Sig: Take 2 tablets by mouth 2 (two) times daily.    Dispense:  360 tablet    Refill:  0    Medications Discontinued During This Encounter  Medication Reason   MELATONIN PO Error   isosorbide-hydrALAZINE (BIDIL) 20-37.5 MG tablet Error   cetirizine (ZYRTEC) 10 MG tablet Error   sacubitril-valsartan (ENTRESTO) 49-51 MG      Current Outpatient Medications:    aspirin 81 MG tablet, Take 1 tablet (81 mg total) by mouth daily., Disp: , Rfl:    atorvastatin (LIPITOR) 20 MG tablet, Take 20 mg by mouth daily., Disp: , Rfl:    BLACK ELDERBERRY PO, Take 2 each by mouth daily as needed (immune support). Gummies, Disp: , Rfl:    diltiazem (CARDIZEM CD) 360 MG 24 hr capsule, Take 360 mg by mouth daily., Disp: , Rfl:    diphenhydrAMINE (BENADRYL) 25 mg capsule, Take 25 mg by mouth every 6 (six) hours as needed., Disp: , Rfl:    fluticasone (FLONASE) 50 MCG/ACT nasal spray, Place 2 sprays into both nostrils daily as needed for allergies or rhinitis., Disp: , Rfl:    KLOR-CON M20 20 MEQ tablet, Take 20 mEq by mouth daily., Disp: , Rfl:  labetalol (NORMODYNE) 300 MG tablet, Take 300 mg by mouth 3 (three) times daily., Disp: , Rfl:    predniSONE (DELTASONE) 5 MG tablet, Take 5 mg by mouth daily. Reported on 11/02/2015, Disp: , Rfl:    tamsulosin (FLOMAX) 0.4 MG CAPS capsule, Take 0.4 mg by mouth daily., Disp: , Rfl:    TIADYLT ER 360 MG 24 hr capsule, Take 360 mg by mouth daily., Disp: , Rfl:    traZODone (DESYREL) 150 MG tablet, Take 1 tablet (150 mg total) by  mouth at bedtime as needed for sleep., Disp: 90 tablet, Rfl: 3   sacubitril-valsartan (ENTRESTO) 49-51 MG, Take 2 tablets by mouth 2 (two) times daily., Disp: 360 tablet, Rfl: 0  Orders Placed This Encounter  Procedures   Basic metabolic panel   Magnesium   Pro b natriuretic peptide (BNP)    There are no Patient Instructions on file for this visit.  --Continue cardiac medications as reconciled in final medication list. --Return in about 3 months (around 05/26/2021) for Follow up, heart failure management.. Or sooner if needed. --Continue follow-up with your primary care physician regarding the management of your other chronic comorbid conditions.  Patient's questions and concerns were addressed to his satisfaction. He voices understanding of the instructions provided during this encounter.   This note was created using a voice recognition software as a result there may be grammatical errors inadvertently enclosed that do not reflect the nature of this encounter. Every attempt is made to correct such errors.  Rex Kras, [place], [facility]  Pager: [phone] Office: [phone]

## 2021-02-24 ENCOUNTER — Ambulatory Visit: Payer: BC Managed Care – PPO | Admitting: Physical Therapy

## 2021-02-27 DIAGNOSIS — M8580 Other specified disorders of bone density and structure, unspecified site: Secondary | ICD-10-CM | POA: Diagnosis not present

## 2021-02-27 DIAGNOSIS — N1831 Chronic kidney disease, stage 3a: Secondary | ICD-10-CM | POA: Diagnosis not present

## 2021-02-27 DIAGNOSIS — I129 Hypertensive chronic kidney disease with stage 1 through stage 4 chronic kidney disease, or unspecified chronic kidney disease: Secondary | ICD-10-CM | POA: Diagnosis not present

## 2021-02-27 DIAGNOSIS — Z79899 Other long term (current) drug therapy: Secondary | ICD-10-CM | POA: Diagnosis not present

## 2021-02-27 DIAGNOSIS — Z0000 Encounter for general adult medical examination without abnormal findings: Secondary | ICD-10-CM | POA: Diagnosis not present

## 2021-02-27 DIAGNOSIS — Z1212 Encounter for screening for malignant neoplasm of rectum: Secondary | ICD-10-CM | POA: Diagnosis not present

## 2021-02-27 DIAGNOSIS — I5189 Other ill-defined heart diseases: Secondary | ICD-10-CM | POA: Diagnosis not present

## 2021-02-27 DIAGNOSIS — Z86711 Personal history of pulmonary embolism: Secondary | ICD-10-CM | POA: Diagnosis not present

## 2021-02-27 DIAGNOSIS — E785 Hyperlipidemia, unspecified: Secondary | ICD-10-CM | POA: Diagnosis not present

## 2021-02-27 DIAGNOSIS — R0602 Shortness of breath: Secondary | ICD-10-CM | POA: Diagnosis not present

## 2021-02-27 DIAGNOSIS — R911 Solitary pulmonary nodule: Secondary | ICD-10-CM | POA: Diagnosis not present

## 2021-02-27 DIAGNOSIS — N2581 Secondary hyperparathyroidism of renal origin: Secondary | ICD-10-CM | POA: Diagnosis not present

## 2021-02-27 DIAGNOSIS — I5032 Chronic diastolic (congestive) heart failure: Secondary | ICD-10-CM | POA: Diagnosis not present

## 2021-03-03 ENCOUNTER — Ambulatory Visit: Payer: BC Managed Care – PPO | Admitting: Physical Therapy

## 2021-03-05 ENCOUNTER — Other Ambulatory Visit: Payer: Self-pay

## 2021-03-05 DIAGNOSIS — I5031 Acute diastolic (congestive) heart failure: Secondary | ICD-10-CM

## 2021-03-06 DIAGNOSIS — I5031 Acute diastolic (congestive) heart failure: Secondary | ICD-10-CM | POA: Diagnosis not present

## 2021-03-07 LAB — BASIC METABOLIC PANEL
BUN/Creatinine Ratio: 9 — ABNORMAL LOW (ref 10–24)
BUN: 15 mg/dL (ref 8–27)
CO2: 27 mmol/L (ref 20–29)
Calcium: 9.6 mg/dL (ref 8.6–10.2)
Chloride: 103 mmol/L (ref 96–106)
Creatinine, Ser: 1.72 mg/dL — ABNORMAL HIGH (ref 0.76–1.27)
Glucose: 105 mg/dL — ABNORMAL HIGH (ref 65–99)
Potassium: 3.9 mmol/L (ref 3.5–5.2)
Sodium: 144 mmol/L (ref 134–144)
eGFR: 44 mL/min/{1.73_m2} — ABNORMAL LOW (ref >59)

## 2021-03-07 LAB — PRO B NATRIURETIC PEPTIDE: NT-Pro BNP: 1171 pg/mL — ABNORMAL HIGH (ref 0–210)

## 2021-03-07 LAB — MAGNESIUM: Magnesium: 2 mg/dL (ref 1.6–2.3)

## 2021-03-09 ENCOUNTER — Other Ambulatory Visit: Payer: Self-pay | Admitting: Cardiology

## 2021-03-09 DIAGNOSIS — I5031 Acute diastolic (congestive) heart failure: Secondary | ICD-10-CM

## 2021-03-09 MED ORDER — ENTRESTO 97-103 MG PO TABS: 1.0000 | ORAL_TABLET | Freq: Two times a day (BID) | ORAL | 0 refills | Status: DC

## 2021-03-10 ENCOUNTER — Other Ambulatory Visit: Payer: Self-pay

## 2021-03-10 MED ORDER — ENTRESTO 97-103 MG PO TABS: 1.0000 | ORAL_TABLET | Freq: Two times a day (BID) | ORAL | 0 refills | Status: DC

## 2021-03-10 NOTE — Progress Notes (Signed)
Spoke to patient he voiced understanding

## 2021-03-17 ENCOUNTER — Other Ambulatory Visit: Payer: Self-pay

## 2021-03-17 MED ORDER — ENTRESTO 97-103 MG PO TABS: 1.0000 | ORAL_TABLET | Freq: Two times a day (BID) | ORAL | 0 refills | Status: DC

## 2021-03-22 ENCOUNTER — Other Ambulatory Visit: Payer: Self-pay

## 2021-03-22 ENCOUNTER — Emergency Department (HOSPITAL_COMMUNITY): Payer: PPO

## 2021-03-22 ENCOUNTER — Encounter (HOSPITAL_COMMUNITY): Payer: Self-pay | Admitting: Internal Medicine

## 2021-03-22 ENCOUNTER — Inpatient Hospital Stay (HOSPITAL_COMMUNITY)
Admission: EM | Admit: 2021-03-22 | Discharge: 2021-03-24 | DRG: 291 | Disposition: A | Discharge status: Home / Self Care | Payer: PPO | Attending: Internal Medicine | Admitting: Internal Medicine

## 2021-03-22 DIAGNOSIS — Z6830 Body mass index (BMI) 30.0-30.9, adult: Secondary | ICD-10-CM | POA: Diagnosis not present

## 2021-03-22 DIAGNOSIS — J9601 Acute respiratory failure with hypoxia: Secondary | ICD-10-CM

## 2021-03-22 DIAGNOSIS — Z801 Family history of malignant neoplasm of trachea, bronchus and lung: Secondary | ICD-10-CM

## 2021-03-22 DIAGNOSIS — E785 Hyperlipidemia, unspecified: Secondary | ICD-10-CM | POA: Diagnosis present

## 2021-03-22 DIAGNOSIS — Z7901 Long term (current) use of anticoagulants: Secondary | ICD-10-CM | POA: Diagnosis not present

## 2021-03-22 DIAGNOSIS — R4702 Dysphasia: Secondary | ICD-10-CM | POA: Diagnosis not present

## 2021-03-22 DIAGNOSIS — J9621 Acute and chronic respiratory failure with hypoxia: Secondary | ICD-10-CM | POA: Diagnosis not present

## 2021-03-22 DIAGNOSIS — Z8673 Personal history of transient ischemic attack (TIA), and cerebral infarction without residual deficits: Secondary | ICD-10-CM | POA: Diagnosis not present

## 2021-03-22 DIAGNOSIS — Z86711 Personal history of pulmonary embolism: Secondary | ICD-10-CM

## 2021-03-22 DIAGNOSIS — Z79899 Other long term (current) drug therapy: Secondary | ICD-10-CM

## 2021-03-22 DIAGNOSIS — Z20822 Contact with and (suspected) exposure to covid-19: Secondary | ICD-10-CM | POA: Diagnosis not present

## 2021-03-22 DIAGNOSIS — Z8249 Family history of ischemic heart disease and other diseases of the circulatory system: Secondary | ICD-10-CM | POA: Diagnosis not present

## 2021-03-22 DIAGNOSIS — E6609 Other obesity due to excess calories: Secondary | ICD-10-CM | POA: Diagnosis not present

## 2021-03-22 DIAGNOSIS — I5033 Acute on chronic diastolic (congestive) heart failure: Secondary | ICD-10-CM | POA: Diagnosis not present

## 2021-03-22 DIAGNOSIS — I10 Essential (primary) hypertension: Secondary | ICD-10-CM | POA: Diagnosis not present

## 2021-03-22 DIAGNOSIS — Z87891 Personal history of nicotine dependence: Secondary | ICD-10-CM

## 2021-03-22 DIAGNOSIS — I517 Cardiomegaly: Secondary | ICD-10-CM | POA: Diagnosis not present

## 2021-03-22 DIAGNOSIS — K219 Gastro-esophageal reflux disease without esophagitis: Secondary | ICD-10-CM | POA: Diagnosis present

## 2021-03-22 DIAGNOSIS — I130 Hypertensive heart and chronic kidney disease with heart failure and stage 1 through stage 4 chronic kidney disease, or unspecified chronic kidney disease: Secondary | ICD-10-CM | POA: Diagnosis not present

## 2021-03-22 DIAGNOSIS — Z6832 Body mass index (BMI) 32.0-32.9, adult: Secondary | ICD-10-CM | POA: Diagnosis not present

## 2021-03-22 DIAGNOSIS — R0989 Other specified symptoms and signs involving the circulatory and respiratory systems: Secondary | ICD-10-CM | POA: Diagnosis not present

## 2021-03-22 DIAGNOSIS — D869 Sarcoidosis, unspecified: Secondary | ICD-10-CM | POA: Diagnosis not present

## 2021-03-22 DIAGNOSIS — Z7952 Long term (current) use of systemic steroids: Secondary | ICD-10-CM | POA: Diagnosis not present

## 2021-03-22 DIAGNOSIS — R0602 Shortness of breath: Secondary | ICD-10-CM | POA: Diagnosis not present

## 2021-03-22 DIAGNOSIS — Z7982 Long term (current) use of aspirin: Secondary | ICD-10-CM | POA: Diagnosis not present

## 2021-03-22 DIAGNOSIS — N1832 Chronic kidney disease, stage 3b: Secondary | ICD-10-CM | POA: Diagnosis present

## 2021-03-22 DIAGNOSIS — J80 Acute respiratory distress syndrome: Secondary | ICD-10-CM | POA: Diagnosis not present

## 2021-03-22 DIAGNOSIS — E669 Obesity, unspecified: Secondary | ICD-10-CM | POA: Diagnosis not present

## 2021-03-22 DIAGNOSIS — N1831 Chronic kidney disease, stage 3a: Secondary | ICD-10-CM | POA: Diagnosis not present

## 2021-03-22 DIAGNOSIS — R0689 Other abnormalities of breathing: Secondary | ICD-10-CM | POA: Diagnosis not present

## 2021-03-22 DIAGNOSIS — I5032 Chronic diastolic (congestive) heart failure: Secondary | ICD-10-CM | POA: Diagnosis present

## 2021-03-22 DIAGNOSIS — J90 Pleural effusion, not elsewhere classified: Secondary | ICD-10-CM | POA: Diagnosis not present

## 2021-03-22 DIAGNOSIS — R0902 Hypoxemia: Secondary | ICD-10-CM | POA: Diagnosis present

## 2021-03-22 LAB — CBC WITH DIFFERENTIAL/PLATELET
Abs Immature Granulocytes: 0.04 10*3/uL (ref 0.00–0.07)
Basophils Absolute: 0 10*3/uL (ref 0.0–0.1)
Basophils Relative: 0 %
Eosinophils Absolute: 0.3 10*3/uL (ref 0.0–0.5)
Eosinophils Relative: 3 %
HCT: 39.8 % (ref 39.0–52.0)
Hemoglobin: 13.2 g/dL (ref 13.0–17.0)
Immature Granulocytes: 0 %
Lymphocytes Relative: 11 %
Lymphs Abs: 1.1 10*3/uL (ref 0.7–4.0)
MCH: 30.8 pg (ref 26.0–34.0)
MCHC: 33.2 g/dL (ref 30.0–36.0)
MCV: 92.8 fL (ref 80.0–100.0)
Monocytes Absolute: 0.9 10*3/uL (ref 0.1–1.0)
Monocytes Relative: 9 %
Neutro Abs: 7.5 10*3/uL (ref 1.7–7.7)
Neutrophils Relative %: 77 %
Platelets: 163 10*3/uL (ref 150–400)
RBC: 4.29 MIL/uL (ref 4.22–5.81)
RDW: 19.7 % — ABNORMAL HIGH (ref 11.5–15.5)
WBC: 9.9 10*3/uL (ref 4.0–10.5)
nRBC: 0 % (ref 0.0–0.2)

## 2021-03-22 LAB — RESP PANEL BY RT-PCR (FLU A&B, COVID) ARPGX2
Influenza A by PCR: NEGATIVE
Influenza B by PCR: NEGATIVE
SARS Coronavirus 2 by RT PCR: NEGATIVE

## 2021-03-22 LAB — COMPREHENSIVE METABOLIC PANEL
ALT: 35 U/L (ref 0–44)
AST: 33 U/L (ref 15–41)
Albumin: 3.4 g/dL — ABNORMAL LOW (ref 3.5–5.0)
Alkaline Phosphatase: 58 U/L (ref 38–126)
Anion gap: 7 (ref 5–15)
BUN: 16 mg/dL (ref 8–23)
CO2: 27 mmol/L (ref 22–32)
Calcium: 8.7 mg/dL — ABNORMAL LOW (ref 8.9–10.3)
Chloride: 108 mmol/L (ref 98–111)
Creatinine, Ser: 1.65 mg/dL — ABNORMAL HIGH (ref 0.61–1.24)
GFR, Estimated: 46 mL/min — ABNORMAL LOW (ref >60)
Glucose, Bld: 117 mg/dL — ABNORMAL HIGH (ref 70–99)
Potassium: 4.8 mmol/L (ref 3.5–5.1)
Sodium: 142 mmol/L (ref 135–145)
Total Bilirubin: 0.7 mg/dL (ref 0.3–1.2)
Total Protein: 6.3 g/dL — ABNORMAL LOW (ref 6.5–8.1)

## 2021-03-22 LAB — TROPONIN I (HIGH SENSITIVITY)
Troponin I (High Sensitivity): 12 ng/L (ref <18)
Troponin I (High Sensitivity): 13 ng/L (ref <18)

## 2021-03-22 LAB — BRAIN NATRIURETIC PEPTIDE: B Natriuretic Peptide: 3939.4 pg/mL — ABNORMAL HIGH (ref 0.0–100.0)

## 2021-03-22 MED ORDER — MAGNESIUM SULFATE 2 GM/50ML IV SOLN
2.0000 g | Freq: Once | INTRAVENOUS | Status: AC
  Administered 2021-03-22: 2 g via INTRAVENOUS
  Filled 2021-03-22: qty 50

## 2021-03-22 MED ORDER — FUROSEMIDE 10 MG/ML IJ SOLN: 40.0000 mg | Freq: Two times a day (BID) | INTRAMUSCULAR | Status: DC

## 2021-03-22 MED ORDER — BISACODYL 5 MG PO TBEC: 5.0000 mg | DELAYED_RELEASE_TABLET | Freq: Every day | ORAL | Status: DC | PRN

## 2021-03-22 MED ORDER — ALBUTEROL (5 MG/ML) CONTINUOUS INHALATION SOLN
10.0000 mg/h | INHALATION_SOLUTION | Freq: Once | RESPIRATORY_TRACT | Status: AC
  Administered 2021-03-22: 10 mg/h via RESPIRATORY_TRACT

## 2021-03-22 MED ORDER — ASPIRIN 81 MG PO CHEW
80.0000 mg | CHEWABLE_TABLET | Freq: Every day | ORAL | Status: DC
  Administered 2021-03-22 – 2021-03-24 (×3): 81 mg via ORAL
  Filled 2021-03-22 (×3): qty 1

## 2021-03-22 MED ORDER — ACETAMINOPHEN 325 MG PO TABS
650.0000 mg | ORAL_TABLET | Freq: Four times a day (QID) | ORAL | Status: DC | PRN
  Administered 2021-03-22: 650 mg via ORAL
  Filled 2021-03-22: qty 2

## 2021-03-22 MED ORDER — TAMSULOSIN HCL 0.4 MG PO CAPS
0.4000 mg | ORAL_CAPSULE | Freq: Every day | ORAL | Status: DC
  Administered 2021-03-22 – 2021-03-24 (×3): 0.4 mg via ORAL
  Filled 2021-03-22 (×3): qty 1

## 2021-03-22 MED ORDER — LABETALOL HCL 300 MG PO TABS
300.0000 mg | ORAL_TABLET | Freq: Three times a day (TID) | ORAL | Status: DC
  Administered 2021-03-22 – 2021-03-24 (×6): 300 mg via ORAL
  Filled 2021-03-22 (×2): qty 1
  Filled 2021-03-22: qty 2
  Filled 2021-03-22 (×3): qty 1

## 2021-03-22 MED ORDER — HYDRALAZINE HCL 20 MG/ML IJ SOLN: 5.0000 mg | INTRAMUSCULAR | Status: DC | PRN

## 2021-03-22 MED ORDER — SODIUM CHLORIDE 0.9% FLUSH
3.0000 mL | Freq: Two times a day (BID) | INTRAVENOUS | Status: DC
  Administered 2021-03-22 – 2021-03-24 (×5): 3 mL via INTRAVENOUS

## 2021-03-22 MED ORDER — IPRATROPIUM BROMIDE 0.02 % IN SOLN
0.5000 mg | Freq: Once | RESPIRATORY_TRACT | Status: AC
  Administered 2021-03-22: 0.5 mg via RESPIRATORY_TRACT

## 2021-03-22 MED ORDER — ACETAMINOPHEN 650 MG RE SUPP: 650.0000 mg | Freq: Four times a day (QID) | RECTAL | Status: DC | PRN

## 2021-03-22 MED ORDER — SACUBITRIL-VALSARTAN 97-103 MG PO TABS
1.0000 | ORAL_TABLET | Freq: Two times a day (BID) | ORAL | Status: DC
  Administered 2021-03-22 – 2021-03-24 (×5): 1 via ORAL
  Filled 2021-03-22 (×7): qty 1

## 2021-03-22 MED ORDER — DOCUSATE SODIUM 100 MG PO CAPS
100.0000 mg | ORAL_CAPSULE | Freq: Two times a day (BID) | ORAL | Status: DC
  Administered 2021-03-22 – 2021-03-24 (×4): 100 mg via ORAL
  Filled 2021-03-22 (×5): qty 1

## 2021-03-22 MED ORDER — ATORVASTATIN CALCIUM 10 MG PO TABS
20.0000 mg | ORAL_TABLET | Freq: Every day | ORAL | Status: DC
  Administered 2021-03-23: 20 mg via ORAL
  Filled 2021-03-22: qty 2

## 2021-03-22 MED ORDER — METHYLPREDNISOLONE SODIUM SUCC 125 MG IJ SOLR
125.0000 mg | Freq: Once | INTRAMUSCULAR | Status: AC
  Administered 2021-03-22: 125 mg via INTRAVENOUS
  Filled 2021-03-22: qty 2

## 2021-03-22 MED ORDER — TRAZODONE HCL 50 MG PO TABS
150.0000 mg | ORAL_TABLET | Freq: Every evening | ORAL | Status: DC | PRN
  Administered 2021-03-22 – 2021-03-23 (×2): 150 mg via ORAL
  Filled 2021-03-22 (×2): qty 1

## 2021-03-22 MED ORDER — BUMETANIDE 0.25 MG/ML IJ SOLN
1.0000 mg | Freq: Two times a day (BID) | INTRAMUSCULAR | Status: DC
  Administered 2021-03-22 – 2021-03-23 (×2): 1 mg via INTRAVENOUS
  Filled 2021-03-22 (×3): qty 4

## 2021-03-22 MED ORDER — ATORVASTATIN CALCIUM 10 MG PO TABS
20.0000 mg | ORAL_TABLET | Freq: Every day | ORAL | Status: DC
  Administered 2021-03-22: 20 mg via ORAL
  Filled 2021-03-22: qty 2

## 2021-03-22 MED ORDER — ONDANSETRON HCL 4 MG/2ML IJ SOLN: 4.0000 mg | Freq: Four times a day (QID) | INTRAMUSCULAR | Status: DC | PRN

## 2021-03-22 MED ORDER — POLYETHYLENE GLYCOL 3350 17 G PO PACK
17.0000 g | PACK | Freq: Every day | ORAL | Status: DC | PRN
  Administered 2021-03-22: 17 g via ORAL
  Filled 2021-03-22: qty 1

## 2021-03-22 MED ORDER — MORPHINE SULFATE (PF) 2 MG/ML IV SOLN: 2.0000 mg | INTRAVENOUS | Status: DC | PRN

## 2021-03-22 MED ORDER — ENOXAPARIN SODIUM 60 MG/0.6ML IJ SOSY
0.5000 mg/kg | PREFILLED_SYRINGE | INTRAMUSCULAR | Status: DC
  Administered 2021-03-22 – 2021-03-23 (×2): 50 mg via SUBCUTANEOUS
  Filled 2021-03-22: qty 0.5
  Filled 2021-03-22: qty 0.6

## 2021-03-22 MED ORDER — OXYCODONE HCL 5 MG PO TABS
5.0000 mg | ORAL_TABLET | ORAL | Status: DC | PRN
  Administered 2021-03-23: 5 mg via ORAL
  Filled 2021-03-22: qty 1

## 2021-03-22 MED ORDER — ONDANSETRON HCL 4 MG PO TABS: 4.0000 mg | ORAL_TABLET | Freq: Four times a day (QID) | ORAL | Status: DC | PRN

## 2021-03-22 MED ORDER — DILTIAZEM HCL ER COATED BEADS 180 MG PO CP24
360.0000 mg | ORAL_CAPSULE | Freq: Every day | ORAL | Status: DC
  Administered 2021-03-22 – 2021-03-24 (×3): 360 mg via ORAL
  Filled 2021-03-22: qty 2
  Filled 2021-03-22: qty 1
  Filled 2021-03-22: qty 2

## 2021-03-22 MED ORDER — PREDNISONE 5 MG PO TABS
5.0000 mg | ORAL_TABLET | Freq: Every day | ORAL | Status: DC
  Administered 2021-03-22 – 2021-03-24 (×3): 5 mg via ORAL
  Filled 2021-03-22 (×3): qty 1

## 2021-03-22 MED ORDER — FUROSEMIDE 10 MG/ML IJ SOLN
40.0000 mg | Freq: Once | INTRAMUSCULAR | Status: AC
  Administered 2021-03-22: 40 mg via INTRAVENOUS
  Filled 2021-03-22: qty 4

## 2021-03-22 MED ORDER — EMPAGLIFLOZIN 10 MG PO TABS
10.0000 mg | ORAL_TABLET | Freq: Every day | ORAL | Status: DC
  Administered 2021-03-22 – 2021-03-24 (×2): 10 mg via ORAL
  Filled 2021-03-22 (×3): qty 1

## 2021-03-22 NOTE — ED Notes (Signed)
5C Unable to take report at this time,

## 2021-03-22 NOTE — ED Provider Notes (Signed)
[facility] EMERGENCY DEPARTMENT Provider Note   CSN: [id number] Arrival date & time: 03/22/21  0602     History Chief Complaint  Patient presents with   Respiratory Distress    [name] is a 65 y.o. male.  Level 5 caveat for respiratory distress.  Patient brought in by EMS with difficulty breathing that woke him from sleep this AM.  Felt well he went to bed last night.  Does have a history of sarcoidosis, heart failure with preserved ejection fraction, hypertension, pulmonary embolism on Xarelto, sarcoidosis on chronic prednisone.  On EMS arrival he was saturating 72% on room air and placed on nonrebreather.  He was transitioned to CPAP in route then back to nonrebreather by his request.  On arrival he is tachypneic with diminished breath sounds and minimal air movement with retractions and accessory muscle use. History is limited.  Denies having chest pain, cough or fever.  Denies leg pain or leg swelling.  Denies any abdominal pain, nausea or vomiting.  The history is provided by the patient and the EMS personnel. The history is limited by the condition of the patient.      Past Medical History:  Diagnosis Date   Back pain    CKD (chronic kidney disease), stage III ([place])    CVA (cerebral infarction)    Diastolic dysfunction    ED (erectile dysfunction)    GERD (gastroesophageal reflux disease)    Hypercalcemia    Hyperlipidemia    Hypertension    Insomnia    Long-term use of high-risk medication    Nephrocalcinosis    Nephrolithiasis    Obesity    Osteopenia    Pancreatitis    PE (pulmonary embolism)    Proteinuria    Sarcoidosis    Smoker     Patient Active Problem List   Diagnosis Date Noted   Encephalopathy acute 12/22/2020   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   Chronic intermittent hypoxia with obstructive sleep apnea 12/22/2020   Mild cognitive impairment 12/22/2020   Acute combined systolic and  diastolic congestive heart failure ([place]) 12/22/2020   Rotator cuff arthropathy, right 12/04/2020   Volume overload 12/04/2020   Shortness of breath 12/04/2020   ARF (acute renal failure) ([place]) 11/19/2020   Acute respiratory failure ([place]) 11/19/2020   Bacteremia due to methicillin susceptible Staphylococcus aureus (MSSA) 11/19/2020   Cranial neuropathy    Acute encephalopathy 11/18/2020   Chronic right shoulder pain 06/18/2020   OSA (obstructive sleep apnea) 05/05/2020   Hypersomnia with sleep apnea 12/20/2019   Psychophysiological insomnia 09/18/2019   Nocturia more than twice per night 09/18/2019   Renal hypertension 09/18/2019   Moderate asthma 09/18/2019   Tobacco abuse 09/18/2019   Non-restorative sleep 09/18/2019   Nontraumatic complete tear of right rotator cuff 08/15/2019   Delirium 03/16/2019   Stage 3b chronic kidney disease    CVA (cerebral infarction)    Diastolic dysfunction    Pseudarthrosis 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 02/28/2019   Intervertebral disc disorder with radiculopathy of lumbar region 09/13/2017   Acute gouty arthritis 03/07/2017   Arthralgia of left foot 03/07/2017   Nephrolithiasis    Fever, unspecified 11/02/2015   Essential hypertension 02/07/2015   Pulmonary embolism ([place]) 01/30/2013   Sarcoidosis 01/30/2013   Hemoptysis 01/30/2013   SHOULDER PAIN, LEFT 11/28/2009    Past Surgical History:  Procedure Laterality Date   ABDOMINAL EXPLORATION SURGERY     BACK SURGERY     CHOLECYSTECTOMY  SHOULDER ARTHROSCOPY Right 10/30/2020   Procedure: ARTHROSCOPY SHOULDER WITH EXTENSIVE DEBRIDEMENT;  Surgeon: Mcarthur Rossetti, MD;  Location: [place];  Service: Orthopedics;  Laterality: Right;   SHOULDER SURGERY     TRACHEOSTOMY     closed       Family History  Problem Relation Age of Onset   Hypertension Father    CVA Father    Lung cancer Father    Alzheimer's disease Mother    Hypertension Sister    Heart failure Sister      Social History   Tobacco Use   Smoking status: Former    Packs/day: 0.25    Years: 15.00    Pack years: 3.75    Types: Cigarettes    Start date: 11/27/2020    Quit date: 12/30/2020    Years since quitting: 0.2   Smokeless tobacco: Never  Vaping Use   Vaping Use: Never used  Substance Use Topics   Alcohol use: No    Alcohol/week: 0.0 standard drinks   Drug use: No    Home Medications Prior to Admission medications   Medication Sig Start Date End Date Taking? Authorizing Provider  aspirin 81 MG tablet Take 1 tablet (81 mg total) by mouth daily. 02/01/13   [name], MD  atorvastatin (LIPITOR) 20 MG tablet Take 20 mg by mouth daily. 02/02/19   [provider]  BLACK ELDERBERRY PO Take 2 each by mouth daily as needed (immune support). Gummies    [provider]  diltiazem (CARDIZEM CD) 360 MG 24 hr capsule Take 360 mg by mouth daily.    [provider]  diphenhydrAMINE (BENADRYL) 25 mg capsule Take 25 mg by mouth every 6 (six) hours as needed.    [provider]  fluticasone (FLONASE) 50 MCG/ACT nasal spray Place 2 sprays into both nostrils daily as needed for allergies or rhinitis.    [provider]  KLOR-CON M20 20 MEQ tablet Take 20 mEq by mouth daily. 12/08/20   [provider]  labetalol (NORMODYNE) 300 MG tablet Take 300 mg by mouth 3 (three) times daily.    [provider]  predniSONE (DELTASONE) 5 MG tablet Take 5 mg by mouth daily. Reported on 11/02/2015    [provider]  sacubitril-valsartan (ENTRESTO) 97-103 MG Take 1 tablet by mouth 2 (two) times daily. 03/17/21 06/15/21  Tolia, Sunit, DO  tamsulosin (FLOMAX) 0.4 MG CAPS capsule Take 0.4 mg by mouth daily. 11/01/18   [provider]  TIADYLT ER 360 MG 24 hr capsule Take 360 mg by mouth daily. 12/21/20   [provider]  traZODone (DESYREL) 150 MG tablet Take 1 tablet (150 mg total) by mouth at bedtime as needed for sleep. 12/22/20    Dohmeier, Asencion Partridge, MD    Allergies    Codeine and Hydromorphone  Review of Systems   Review of Systems  Unable to perform ROS: Severe respiratory distress   Physical Exam Updated Vital Signs Temp (!) 96.8 F (36 C) (Temporal)   SpO2 100%   Physical Exam Vitals and nursing note reviewed.  Constitutional:      General: He is in acute distress.     Appearance: He is well-developed. He is ill-appearing.     Comments: Severe respiratory distress, speaking short phrases  HENT:     Head: Normocephalic and atraumatic.     Mouth/Throat:     Pharynx: No oropharyngeal exudate.  Eyes:     Conjunctiva/sclera: Conjunctivae normal.  Pupils: Pupils are equal, round, and reactive to light.  Neck:     Comments: No meningismus. Cardiovascular:     Rate and Rhythm: Normal rate and regular rhythm.     Heart sounds: Normal heart sounds. No murmur heard. Pulmonary:     Effort: Respiratory distress present.     Breath sounds: Wheezing present.     Comments: Minimal air exchange with expiratory wheezing Tachypneic, retractions, accessory muscle use Abdominal:     Palpations: Abdomen is soft.     Tenderness: There is no abdominal tenderness. There is no guarding or rebound.  Musculoskeletal:        General: No tenderness. Normal range of motion.     Cervical back: Normal range of motion and neck supple.  Skin:    General: Skin is warm.  Neurological:     Mental Status: He is alert and oriented to person, place, and time.     Cranial Nerves: No cranial nerve deficit.     Motor: No abnormal muscle tone.     Coordination: Coordination normal.     Comments:  5/5 strength throughout. CN 2-12 intact.Equal grip strength.   Psychiatric:        Behavior: Behavior normal.    ED Results / Procedures / Treatments   Labs (all labs ordered are listed, but only abnormal results are displayed) Labs Reviewed  RESP PANEL BY RT-PCR (FLU A&B, COVID) ARPGX2  CBC WITH DIFFERENTIAL/PLATELET   COMPREHENSIVE METABOLIC PANEL  BRAIN NATRIURETIC PEPTIDE  TROPONIN I (HIGH SENSITIVITY)    EKG EKG Interpretation  Date/Time:  Sunday March 22 2021 06:05:37 EDT Ventricular Rate:  75 PR Interval:  170 QRS Duration: 95 QT Interval:  441 QTC Calculation: 493 R Axis:   67 Text Interpretation: Sinus rhythm Anteroseptal infarct, age indeterminate Artifact needs repeat Confirmed by Ezequiel Essex [id number]) on 03/22/2021 6:24:05 AM  Radiology No results found.  Procedures .Critical Care  Date/Time: 03/22/2021 8:30 AM Performed by: Ezequiel Essex, MD Authorized by: Ezequiel Essex, MD   Critical care provider statement:    Critical care time (minutes):  45   Critical care was necessary to treat or prevent imminent or life-threatening deterioration of the following conditions:  Respiratory failure   Critical care was time spent personally by me on the following activities:  Discussions with consultants, evaluation of patient's response to treatment, examination of patient, ordering and performing treatments and interventions, ordering and review of laboratory studies, ordering and review of radiographic studies, pulse oximetry, re-evaluation of patient's condition, obtaining history from patient or surrogate and review of old charts   Medications Ordered in ED Medications  magnesium sulfate IVPB 2 g 50 mL (2 g Intravenous New Bag/Given 03/22/21 0621)  methylPREDNISolone sodium succinate (SOLU-MEDROL) 125 mg/2 mL injection 125 mg (125 mg Intravenous Given 03/22/21 0618)  albuterol (PROVENTIL,VENTOLIN) solution continuous neb (10 mg/hr Nebulization Given 03/22/21 0610)  ipratropium (ATROVENT) nebulizer solution 0.5 mg (0.5 mg Nebulization Given 03/22/21 7867)    ED Course  I have reviewed the triage vital signs and the nursing notes.  Pertinent labs & imaging results that were available during my care of the patient were reviewed by me and considered in my medical decision making (see  chart for details).    MDM Rules/Calculators/A&P                         Respiratory distress with hypoxic failure.  Very diminished on arrival.  Patient transition to continuous nebulizer, IV  steroids and magnesium given.  EKG nonischemic. CXR with edema.  Lasix added.  Improving work of breathing after nebulizers and steroids. Weaned to nasal cannula O2.  BNP elevated. Patient's cardiologist stopped his diuretics in June it appears.  Suspect multifactorial respiratory failure due to sarcoidosis as well as CHF. Given new O2 requirement, will need admission for further treatment.  D/w Dr. Lorin Mercy.   Final Clinical Impression(s) / ED Diagnoses Final diagnoses:  None    Rx / DC Orders ED Discharge Orders     None        Hellon Vaccarella, Annie Main, MD 03/22/21 [phone]

## 2021-03-22 NOTE — Consult Note (Signed)
CARDIOLOGY CONSULT NOTE  Patient ID: [name] MRN: [id number] DOB/AGE: 64/15/1957 64 y.o.  Admit date: 03/22/2021 Attending physician: Karmen Bongo, MD Primary Physician:  [name], MD Outpatient Cardiologist: Rex Kras, DO, [facility] Inpatient Cardiologist: Rex Kras, DO, [facility]  Reason of consultation: Acute heart failure exacerbation Referring physician: Karmen Bongo, MD  Chief complaint: Shortness of breath  HPI:  [name] is a 65 y.o. Caucasian male who presents with a chief complaint of " shortness of breath." His past medical history and cardiovascular risk factors include: Heart failure with preserved EF, sarcoidosis, HTN, former smoker, Hx of PE (was on Xarelto for 1 year), OSA not on CPAP, MSSA Bacteremia (March 2022), Hx of cardiac arrest (2002 during his hospitalization for pancreatitis per wife).  Patient states that he woke up in the middle of the night approximately 2 AM due to difficulty in breathing.  He did not want to disturb his wife so he goes downstairs and sits until 5 AM.  When his wife came downstairs and saw him she called EMS and patient was brought [facility] for further evaluation and management.  Patient was last seen in the office on 02/23/2021.  Clinically he was doing well.  We uptitrated his Entresto to 97/103 mg p.o. twice daily.  Given the fact that he was euvolemic and due to underlying chronic kidney disease this year decision was to hold off diuretics to prevent further deterioration of kidney function.  However, patient states that after the office visit he had started increasing salt in his diet despite knowing that he supposed be on salt restriction, his weight was fluctuating, and had started noticing his SBP being elevated around 160-172 mmHg.  For reasons unknown patient continued this dietary indiscretion with salt intake and did not call the office for further medication titration.  Review of electronic medical records  notes that in the EMS he was saturating 70% on room air, briefly placed on a nonrebreather and then transition to CPAP in route.  He is now on nasal cannula oxygen.  Work-up thus far notes chest x-ray findings of pulmonary vascular congestion, proBNP is elevated (not accurate as he is on Entresto), high sensitive troponins negative x2).  ALLERGIES: Allergies  Allergen Reactions   Codeine Nausea Only   Hydromorphone Other (See Comments)    hallucinations     PAST MEDICAL HISTORY: Past Medical History:  Diagnosis Date   Back pain    CKD (chronic kidney disease), stage III (HCC)    CVA (cerebral infarction)    Diastolic dysfunction    ED (erectile dysfunction)    GERD (gastroesophageal reflux disease)    Hypercalcemia    Hyperlipidemia    Hypertension    Insomnia    Long-term use of high-risk medication    Nephrocalcinosis    Nephrolithiasis    Obesity    Osteopenia    Pancreatitis    PE (pulmonary embolism)    Proteinuria    Sarcoidosis    Smoker     PAST SURGICAL HISTORY: Past Surgical History:  Procedure Laterality Date   ABDOMINAL EXPLORATION SURGERY     BACK SURGERY     CHOLECYSTECTOMY     SHOULDER ARTHROSCOPY Right 10/30/2020   Procedure: ARTHROSCOPY SHOULDER WITH EXTENSIVE DEBRIDEMENT;  Surgeon: Mcarthur Rossetti, MD;  Location: [place];  Service: Orthopedics;  Laterality: Right;   SHOULDER SURGERY     TRACHEOSTOMY     closed    FAMILY HISTORY: The patient  family history includes Alzheimer's disease in his mother; CVA in his father; Heart failure in his sister; Hypertension in his father and sister; Lung cancer in his father.   SOCIAL HISTORY:  The patient  reports that he quit smoking about 2 months ago. His smoking use included cigarettes. He started smoking about 3 months ago. He has a 3.75 pack-year smoking history. He has never used smokeless tobacco. He reports that he does not drink alcohol and does not use  drugs.  MEDICATIONS: Current Outpatient Medications  Medication Instructions   aspirin 81 mg, Oral, Daily   atorvastatin (LIPITOR) 20 mg, Oral, Daily   BLACK ELDERBERRY PO 2 each, Oral, Daily PRN, Gummies   Chlorphen-Phenyleph-ASA (ALKA-SELTZER PLUS COLD PO) 2 tablets, Oral, Every 6 hours PRN   diltiazem (CARDIZEM CD) 360 mg, Daily   diphenhydrAMINE (BENADRYL) 25 mg, Oral, Every 6 hours PRN   fluticasone (FLONASE) 50 MCG/ACT nasal spray 2 sprays, Each Nare, Daily PRN   KLOR-CON M20 20 MEQ tablet 20 mEq, Oral, Daily   labetalol (NORMODYNE) 300 mg, 3 times daily   predniSONE (DELTASONE) 5 mg, Oral, Daily, Reported on 11/02/2015   sacubitril-valsartan (ENTRESTO) 97-103 MG 1 tablet, Oral, 2 times daily   tamsulosin (FLOMAX) 0.4 mg, Oral, Daily   traZODone (DESYREL) 150 mg, Oral, At bedtime PRN    REVIEW OF SYSTEMS: Review of Systems  Constitutional: Negative for chills and fever.  HENT:  Negative for hoarse voice and nosebleeds.   Eyes:  Negative for discharge, double vision and pain.  Cardiovascular:  Positive for leg swelling and orthopnea. Negative for chest pain, claudication, dyspnea on exertion, near-syncope, palpitations, paroxysmal nocturnal dyspnea and syncope.  Respiratory:  Positive for shortness of breath. Negative for hemoptysis.   Musculoskeletal:  Negative for muscle cramps and myalgias.  Gastrointestinal:  Negative for abdominal pain, constipation, diarrhea, hematemesis, hematochezia, melena, nausea and vomiting.  Neurological:  Negative for dizziness and light-headedness.  All other systems reviewed and are negative.  PHYSICAL EXAM: Vitals with BMI 03/22/2021 03/22/2021 03/22/2021  Height - - -  Weight - - -  BMI - - -  Systolic 242 353 614  Diastolic 98 90 431  Pulse 73 65 73     Intake/Output Summary (Last 24 hours) at 03/22/2021 1628 Last data filed at 03/22/2021 5400 Gross per 24 hour  Intake 50 ml  Output --  Net 50 ml    Net IO Since Admission: 50 mL  [03/22/21 1628]  CONSTITUTIONAL: Age-appropriate male, hemodynamically stable, no acute distress. SKIN: Skin is warm and dry. No rash noted. No cyanosis. No pallor. No jaundice HEAD: Normocephalic and atraumatic.  EYES: No scleral icterus MOUTH/THROAT: Moist oral membranes.  NECK: JVD present. No thyromegaly noted. No carotid bruits  LYMPHATIC: No visible cervical adenopathy.  CHEST Normal respiratory effort. No intercostal retractions  LUNGS: Clear breath sounds anteriorly with rales noted posteriorly at the bases.  No expiratory wheezes or stridor.   CARDIOVASCULAR: Regular, positive Q6-P6, soft holosystolic murmur heard at the apex, no gallops or rubs. ABDOMINAL: Obese, soft, nontender, nondistended, positive bowel sounds in all 4 quadrants, no apparent ascites.  EXTREMITIES: Trace bilateral peripheral edema, warm to touch HEMATOLOGIC: No significant bruising NEUROLOGIC: Oriented to person, place, and time. Nonfocal. Normal muscle tone.  PSYCHIATRIC: Normal mood and affect. Normal behavior. Cooperative  RADIOLOGY: DG Chest Portable 1 View  Result Date: 03/22/2021 CLINICAL DATA:  65 year old male with history of shortness of breath. EXAM: PORTABLE CHEST 1 VIEW COMPARISON:  Chest x-ray 11/21/2020. FINDINGS:  There is cephalization of the pulmonary vasculature and slight indistinctness of the interstitial markings suggestive of mild-to-moderate pulmonary edema. Small bilateral pleural effusions (right greater than left). No pneumothorax. Mild cardiomegaly. Numerous calcified mediastinal and bilateral hilar lymph nodes are incidentally noted. Upper mediastinal contours are within normal limits. IMPRESSION: 1. The appearance the chest suggest congestive heart failure, as above. Electronically Signed   By: Vinnie Langton M.D.   On: 03/22/2021 06:38    LABORATORY DATA: Lab Results  Component Value Date   WBC 9.9 03/22/2021   HGB 13.2 03/22/2021   HCT 39.8 03/22/2021   MCV 92.8 03/22/2021    PLT 163 03/22/2021    Recent Labs  Lab 03/22/21 0608  NA 142  K 4.8  CL 108  CO2 27  BUN 16  CREATININE 1.65*  CALCIUM 8.7*  PROT 6.3*  BILITOT 0.7  ALKPHOS 58  ALT 35  AST 33  GLUCOSE 117*    Lipid Panel  Lab Results  Component Value Date   CHOL 123 03/15/2019   HDL 32 (L) 03/15/2019   LDLCALC 65 03/15/2019   TRIG 130 03/15/2019   CHOLHDL 3.8 03/15/2019    BNP (last 3 results) Recent Labs    11/18/20 1952 02/13/21 0731 03/22/21 0608  BNP 1,079.0* 242.7* 3,939.4*    HEMOGLOBIN A1C Lab Results  Component Value Date   HGBA1C 5.8 (H) 03/15/2019   MPG 120 03/15/2019    Cardiac Panel (last 3 results) Recent Labs    03/22/21 0608 03/22/21 1004  TROPONINIHS 12 13     TSH Recent Labs    11/19/20 0233  TSH 1.247     CARDIAC DATABASE: EKG: 03/22/2021: Normal sinus rhythm, 75 bpm, baseline artifact.  Echocardiogram: 12/15/2020: Normal LV systolic function with visual EF 55-60%. Left ventricle cavity is normal in size. Moderate left ventricular hypertrophy. Normal global wall motion. Indeterminate diastolic filling pattern, normal LAP.  Left atrial cavity is mildly dilated.  Mild (Grade I) aortic regurgitation.  Mild (Grade I) mitral regurgitation.  Mild tricuspid regurgitation. No evidence of pulmonary hypertension.  IVC is dilated with a respiratory response of >50%.  Prior study 11/19/2020: LVEF 60-65%, Grade I DD, Mild LAE, Trivial MR, RAP 6mmHg.   Stress Testing: No results found for this or any previous visit from the past 1095 days.   Heart Catheterization: None  IMPRESSION & RECOMMENDATIONS: [name] is a 65 y.o. Caucasian male whose past medical history and cardiovascular risk factors include: Heart failure with preserved EF, sarcoidosis, HTN, former smoker, Hx of PE (was on Xarelto for 1 year), OSA not on CPAP, MSSA Bacteremia (March 2022), Hx of cardiac arrest (2002 during his hospitalization for pancreatitis per  wife).  Acute on chronic HFpEF, stage C, NYHA class III: Exacerbation/hospitalization due to dietary indiscretion with salt intake Also secondary to flash pulmonary edema due to uncontrolled hypertension Weight on admission 101.3 kg which is very similar to his last office weight of 101.1 kg BNP is elevated but this is not accurate as he is on Entresto. Discontinued his Bumex at the last OV due to increasing his Entresto dose and given his renal function  Strict I's and O's, daily weights. Start Jardiance 10 mg p.o. daily. Change Lasix to Bumex 1 mg twice daily Continue home medications Echo ordered by primary team  Hypertension with chronic kidney disease stage III In the outpatient setting patient is blood pressure was trending low and therefore BiDil was discontinued as hypotension may have been attributing to his  acute kidney injury. Unfortunately, days prior to this hospitalization his blood pressures have been uncontrolled but patient failed to reach out to the office for further medication titration. Continue home medications May consider restarting BiDil if additional antihypertensive medications are needed Monitor for now  Pulmonary vascular congestion: Most likely secondary to acute on chronic HFpEF and uncontrolled hypertension Management as noted above  History of OSA: Currently not on CPAP Patient states that he had a scheduled appointment to see Dr. Brett Fairy tomorrow 03/23/2021 for reevaluation.  History of pulmonary embolism: Currently on oral anticoagulation.  Management per primary team  We will follow the patient with you during his hospitalization.  Total encounter time 88 minutes. *Total Encounter Time as defined by the Centers for Medicare and Medicaid Services includes, in addition to the face-to-face time of a patient visit (documented in the note above) non-face-to-face time: obtaining and reviewing outside history, ordering and reviewing medications, tests or  procedures, care coordination (communications with other health care professionals or caregivers) and documentation in the medical record.  Patient's questions and concerns were addressed to his satisfaction. He voices understanding of the instructions provided during this encounter.   This note was created using a voice recognition software as a result there may be grammatical errors inadvertently enclosed that do not reflect the nature of this encounter. Every attempt is made to correct such errors.  Mechele Claude [facility]  Pager: [phone] Office: [phone] 03/22/2021, 4:28 PM

## 2021-03-22 NOTE — ED Triage Notes (Signed)
Pt brought in to ED by [facility] via stretcher on non-rebreather at time of arrival with c/o of respiratory distress after new onset SOB awakened pt from sleep this AM. Per EMS, PT initial O2 saturation was 72% room air, pt placed on non-rebreather but accessory muscle usage noticed. Pt then placed on CPAP and was transitioned back to non-rebreather per request en route to facility.

## 2021-03-22 NOTE — ED Notes (Signed)
Awaiting bed placement, vitals are stable.

## 2021-03-22 NOTE — ED Notes (Signed)
[phone], direct nurse phone number, no answer.

## 2021-03-22 NOTE — H&P (Signed)
History and Physical    [name] PPI:[id number] DOB: [birth year] DOA: 03/22/2021  PCP: [name], MD Consultants:  Terri Skains - Cardiology Patient coming from:  Home - lives with wife; NOK: Wife, [name], [phone]  Chief Complaint: SOB  HPI: [name] is a 65 y.o. male with medical history significant of stage 3 CKD; CVA; chronic diastolic CHF; HLD; HTN; sarcoidosis; and obesity presenting with SOB.  He reports increasing LE edema, 25 pounds weight gain, orthopnea for several days.  Markedly worse overnight.  Some cough.  Bumex was discontinued at 6/20 appt, Bidil also discontinued recently.    ED Course: Acute hypoxic respiratory distress upon awakening.  NRB -> CPAP -> NRB.  Given nebs, steroids, mag, improving.  Now on [place] O2.  CXR looks like PNA vs. CHF.  Given Lasix.  Cardiology discontinued Bumex at 6/20 appointment.  Review of Systems: As per HPI; otherwise review of systems reviewed and negative.   Ambulatory Status:  Ambulates without assistance  COVID Vaccine Status:  Complete  Past Medical History:  Diagnosis Date   Back pain    CKD (chronic kidney disease), stage III (HCC)    CVA (cerebral infarction)    Diastolic dysfunction    ED (erectile dysfunction)    GERD (gastroesophageal reflux disease)    Hypercalcemia    Hyperlipidemia    Hypertension    Insomnia    Long-term use of high-risk medication    Nephrocalcinosis    Nephrolithiasis    Obesity    Osteopenia    Pancreatitis    PE (pulmonary embolism)    Proteinuria    Sarcoidosis    Smoker     Past Surgical History:  Procedure Laterality Date   ABDOMINAL EXPLORATION SURGERY     BACK SURGERY     CHOLECYSTECTOMY     SHOULDER ARTHROSCOPY Right 10/30/2020   Procedure: ARTHROSCOPY SHOULDER WITH EXTENSIVE DEBRIDEMENT;  Surgeon: Mcarthur Rossetti, MD;  Location: [place];  Service: Orthopedics;  Laterality: Right;   SHOULDER SURGERY     TRACHEOSTOMY     closed     Social History   Socioeconomic History   Marital status: Married    Spouse name: Not on file   Number of children: 2   Years of education: Not on file   Highest education level: Not on file  Occupational History   Not on file  Tobacco Use   Smoking status: Former    Packs/day: 0.25    Years: 15.00    Pack years: 3.75    Types: Cigarettes    Start date: 11/27/2020    Quit date: 12/30/2020    Years since quitting: 0.2   Smokeless tobacco: Never  Vaping Use   Vaping Use: Never used  Substance and Sexual Activity   Alcohol use: No    Alcohol/week: 0.0 standard drinks   Drug use: No   Sexual activity: Yes  Other Topics Concern   Not on file  Social History Narrative   Not on file   Social Determinants of Health   Financial Resource Strain: Not on file  Food Insecurity: Not on file  Transportation Needs: Not on file  Physical Activity: Not on file  Stress: Not on file  Social Connections: Not on file  Intimate Partner Violence: Not on file    Allergies  Allergen Reactions   Codeine Nausea Only   Hydromorphone Other (See Comments)    hallucinations     Family History  Problem Relation  Age of Onset   Hypertension Father    CVA Father    Lung cancer Father    Alzheimer's disease Mother    Hypertension Sister    Heart failure Sister     Prior to Admission medications   Medication Sig Start Date End Date Taking? Authorizing Provider  aspirin 81 MG tablet Take 1 tablet (81 mg total) by mouth daily. 02/01/13   [name], MD  atorvastatin (LIPITOR) 20 MG tablet Take 20 mg by mouth daily. 02/02/19   [provider]  BLACK ELDERBERRY PO Take 2 each by mouth daily as needed (immune support). Gummies    [provider]  diltiazem (CARDIZEM CD) 360 MG 24 hr capsule Take 360 mg by mouth daily.    [provider]  diphenhydrAMINE (BENADRYL) 25 mg capsule Take 25 mg by mouth every 6 (six) hours as needed.    [provider]   fluticasone (FLONASE) 50 MCG/ACT nasal spray Place 2 sprays into both nostrils daily as needed for allergies or rhinitis.    [provider]  KLOR-CON M20 20 MEQ tablet Take 20 mEq by mouth daily. 12/08/20   [provider]  labetalol (NORMODYNE) 300 MG tablet Take 300 mg by mouth 3 (three) times daily.    [provider]  predniSONE (DELTASONE) 5 MG tablet Take 5 mg by mouth daily. Reported on 11/02/2015    [provider]  sacubitril-valsartan (ENTRESTO) 97-103 MG Take 1 tablet by mouth 2 (two) times daily. 03/17/21 06/15/21  Tolia, Sunit, DO  tamsulosin (FLOMAX) 0.4 MG CAPS capsule Take 0.4 mg by mouth daily. 11/01/18   [provider]  TIADYLT ER 360 MG 24 hr capsule Take 360 mg by mouth daily. 12/21/20   [provider]  traZODone (DESYREL) 150 MG tablet Take 1 tablet (150 mg total) by mouth at bedtime as needed for sleep. 12/22/20   Dohmeier, Asencion Partridge, MD    Physical Exam: Vitals:   03/22/21 1144 03/22/21 1200 03/22/21 1330 03/22/21 1435  BP: (!) 160/92 (!) 163/104 (!) 157/90 (!) 170/98  Pulse: 64 73 65 73  Resp: (!) 23 20 (!) 26 16  Temp:   98.5 F (36.9 C) 98.8 F (37.1 C)  TempSrc:   Oral Oral  SpO2: 99% 99% 99% 98%  Weight:      Height:         General:  Appears calm and comfortable and is in NAD, on 3L [place] O2 Eyes:   EOMI, normal lids, iris ENT:  grossly normal hearing, lips & tongue, mmm Neck:  no LAD, masses or thyromegaly Cardiovascular:  RRR, no m/r/g. 1-2+ LE edema.  Respiratory:   CTA bilaterally with scant bibasilar crackles.  Mildly increased respiratory effort. Abdomen:  soft, NT, ND Skin:  no rash or induration seen on limited exam Musculoskeletal:  grossly normal tone BUE/BLE, good ROM, no bony abnormality Psychiatric:  grossly normal mood and affect, speech fluent and appropriate, AOx3 Neurologic:  CN 2-12 grossly intact, moves all extremities in coordinated fashion    Radiological Exams on  Admission: Independently reviewed - see discussion in A/P where applicable  DG Chest Portable 1 View  Result Date: 03/22/2021 CLINICAL DATA:  65 year old male with history of shortness of breath. EXAM: PORTABLE CHEST 1 VIEW COMPARISON:  Chest x-ray 11/21/2020. FINDINGS: There is cephalization of the pulmonary vasculature and slight indistinctness of the interstitial markings suggestive of mild-to-moderate pulmonary edema. Small bilateral pleural effusions (right greater than left). No pneumothorax. Mild cardiomegaly. Numerous  calcified mediastinal and bilateral hilar lymph nodes are incidentally noted. Upper mediastinal contours are within normal limits. IMPRESSION: 1. The appearance the chest suggest congestive heart failure, as above. Electronically Signed   By: Vinnie Langton M.D.   On: 03/22/2021 06:38    EKG: Independently reviewed.  NSR with rate 75; nonspecific ST changes with no evidence of acute ischemia   Labs on Admission: I have personally reviewed the available labs and imaging studies at the time of the admission.  Pertinent labs:   Glucose 117 BUN 16/Creatinine 1.65/GFR 46 - stable HS troponin 12, 13 BNP 39393.4 Unremarkable CBC COVID/flu negative   Assessment/Plan Principal Problem:   Acute on chronic diastolic CHF (congestive heart failure) (HCC) Active Problems:   Sarcoidosis   Essential hypertension   Stage 3b chronic kidney disease   Acute on chronic diastolic CHF -Patient with known h/o chronic diastolic CHF presenting with worsening SOB and hypoxia at home - EMS placed NRB -> CPAP -> NRB, able to wean to 3L [place] O2 in the ER -He was recently stopped on his diuretics -CXR consistent with pulmonary edema -Markedly elevated BNP  -With elevated BNP and abnl CXR, acute decompensated CHF seems probable as diagnosis -Will admit, as per the Emergency HF Mortality Risk Grade.  The patient has: severe pulmonary edema requiring new O2 therapy -Will request  echocardiogram -CHF order set utilized -Cardiology consult -Was given Lasix 40 mg x 1 in ER and will repeat with 40 mg IV BID -Continue [place] O2 for now -Stable kidney function at this time, will follow -Continue Entresto  HTN -Continue Labetalol, Entresto -Consider restarting Bidil -Will also add prn hydralazine  HLD -Continue Lipitor  Sarcoidosis -Continue daily prednisone for now, as this does not appear to be a flare  Stage 3b CKD -Stable at this time  Obesity -Body mass index is 30.29 kg/m..  -Weight loss should be encouraged -Outpatient PCP/bariatric medicine f/u encouraged       Note: This patient has been tested and is negative for the novel coronavirus COVID-19. He has been fully vaccinated against COVID-19.  Level of care: Telemetry Cardiac  DVT prophylaxis: Lovenox  Code Status:  Full - confirmed with patient/family Family Communication: Wife was present throughout evaluation Disposition Plan:  The patient is from: home  Anticipated d/c is to: home, possibly with [facility] services  Anticipated d/c date will depend on clinical response to treatment, likely 2-4 days  Patient is currently: acutely ill Consults called: Cardiology; [facility] team; PT/OT; Nutrition Admission status: Admit - It is my clinical opinion that admission to INPATIENT is reasonable and necessary because this patient will require at least 2 midnights in the hospital to treat this condition based on the medical complexity of the problems presented.  Given the aforementioned information, the predictability of an adverse outcome is felt to be significant.     Karmen Bongo MD [facility]   How to contact the [facility] Attending or Consulting provider [place] or covering provider during after hours [place], for this patient?  Check the care team in [facility] and look for a) attending/consulting [facility] provider listed and b) the [facility] team listed Log into [email or web address] and use [place]'s universal password to  access. If you do not have the password, please contact the hospital operator. Locate the [facility] The provider you are looking for under [facility] and page to a number that you can be directly reached. If you still have difficulty reaching the provider, please page the [facility] (  Director on Call) for the Hospitalists listed on amion for assistance.   03/22/2021, 6:07 PM

## 2021-03-22 NOTE — ED Notes (Signed)
Patient is sitting up sleeping with the wife at the bedside. He was easy to wake but falls asleep shortly after. He did take his pills with some coaching.

## 2021-03-22 NOTE — ED Notes (Signed)
Family updated as to patient's status.

## 2021-03-23 ENCOUNTER — Telehealth: Payer: Self-pay

## 2021-03-23 ENCOUNTER — Ambulatory Visit: Payer: BC Managed Care – PPO | Admitting: Neurology

## 2021-03-23 ENCOUNTER — Inpatient Hospital Stay (HOSPITAL_COMMUNITY): Payer: PPO

## 2021-03-23 DIAGNOSIS — I5033 Acute on chronic diastolic (congestive) heart failure: Secondary | ICD-10-CM

## 2021-03-23 LAB — BASIC METABOLIC PANEL
Anion gap: 6 (ref 5–15)
BUN: 28 mg/dL — ABNORMAL HIGH (ref 8–23)
CO2: 27 mmol/L (ref 22–32)
Calcium: 8.8 mg/dL — ABNORMAL LOW (ref 8.9–10.3)
Chloride: 107 mmol/L (ref 98–111)
Creatinine, Ser: 1.88 mg/dL — ABNORMAL HIGH (ref 0.61–1.24)
GFR, Estimated: 39 mL/min — ABNORMAL LOW (ref >60)
Glucose, Bld: 128 mg/dL — ABNORMAL HIGH (ref 70–99)
Potassium: 4 mmol/L (ref 3.5–5.1)
Sodium: 140 mmol/L (ref 135–145)

## 2021-03-23 LAB — ECHOCARDIOGRAM COMPLETE
AR max vel: 3.1 cm2
AV Area VTI: 3.02 cm2
AV Area mean vel: 2.84 cm2
AV Mean grad: 8 mmHg
AV Peak grad: 15.3 mmHg
AV Vena cont: 0.3 cm
Ao pk vel: 1.96 m/s
Area-P 1/2: 3.74 cm2
Calc EF: 64.6 %
Height: 72 in
MV M vel: 5.58 m/s
MV Peak grad: 124.5 mmHg
MV VTI: 2.99 cm2
P 1/2 time: 497 msec
S' Lateral: 3.4 cm
Single Plane A2C EF: 63.2 %
Single Plane A4C EF: 64.9 %
Weight: 3856 oz

## 2021-03-23 LAB — CBC
HCT: 37.9 % — ABNORMAL LOW (ref 39.0–52.0)
Hemoglobin: 12.6 g/dL — ABNORMAL LOW (ref 13.0–17.0)
MCH: 30.1 pg (ref 26.0–34.0)
MCHC: 33.2 g/dL (ref 30.0–36.0)
MCV: 90.7 fL (ref 80.0–100.0)
Platelets: 164 10*3/uL (ref 150–400)
RBC: 4.18 MIL/uL — ABNORMAL LOW (ref 4.22–5.81)
RDW: 19.4 % — ABNORMAL HIGH (ref 11.5–15.5)
WBC: 8.2 10*3/uL (ref 4.0–10.5)
nRBC: 0 % (ref 0.0–0.2)

## 2021-03-23 MED ORDER — BUMETANIDE 0.25 MG/ML IJ SOLN: 1.0000 mg | Freq: Every day | INTRAMUSCULAR | Status: DC

## 2021-03-23 MED ORDER — ISOSORB DINITRATE-HYDRALAZINE 20-37.5 MG PO TABS
1.0000 | ORAL_TABLET | Freq: Three times a day (TID) | ORAL | Status: DC
  Administered 2021-03-23 – 2021-03-24 (×4): 1 via ORAL
  Filled 2021-03-23 (×4): qty 1

## 2021-03-23 NOTE — Progress Notes (Signed)
Nutrition Education Note  RD consulted for nutrition education regarding low sodium diet education.  RD provided "Low Sodium Nutrition Therapy" handout from the Academy of [employer]. Patient states he has been on a low sodium diet in the past. He requested to review the education materials on his own.  Body mass index is 32.69 kg/m. Pt meets criteria for obesity based on current BMI.  Current diet order is heart healthy, 2 gm sodium, 1500 ml fluid restriction, patient is consuming approximately 75-100% of meals at this time. Labs and medications reviewed. Nutrition focused physical exam completed.  No muscle or subcutaneous fat depletion noticed. Patient reports good appetite. No further nutrition interventions warranted at this time. RD contact information provided. If additional nutrition issues arise, please re-consult RD.   [name], RD, LDN, CNSC Please refer to [facility] for contact information.

## 2021-03-23 NOTE — Progress Notes (Signed)
Progress Note  Patient Name: [name] Date of Encounter: 03/23/2021  Attending physician: Elmarie Shiley, MD Primary care provider: [name], MD Primary Cardiologist: Rex Kras, DO, [facility] Consultant:Terrian Ridlon [place], DO  Subjective: [name] is a 65 y.o. male who was seen and examined at bedside ~ 915am No events overnight. Shortness of breath has resolved. Currently on room air not requiring nasal cannula oxygen Able to ambulate on the floors without any difficulty breathing I's and O's and daily weights are not strictly calculated Case discussed and reviewed with his nurse.  Objective: Vital Signs in the last 24 hours: Temp:  [97.6 F (36.4 C)-98.8 F (37.1 C)] 97.6 F (36.4 C) (07/18 1117) Pulse Rate:  [55-84] 55 (07/18 1117) Resp:  [16-18] 18 (07/18 1117) BP: (147-170)/(79-98) 154/89 (07/18 1117) SpO2:  [93 %-99 %] 94 % (07/18 1117) Weight:  [109.3 kg] 109.3 kg (07/18 0448)  Intake/Output:  Intake/Output Summary (Last 24 hours) at 03/23/2021 1353 Last data filed at 03/23/2021 0939 Gross per 24 hour  Intake 485 ml  Output 1300 ml  Net -815 ml    Net IO Since Admission: -765 mL [03/23/21 1353]  Weights:  Filed Weights   03/22/21 0741 03/23/21 0448  Weight: 101.3 kg 109.3 kg    Telemetry: Personally reviewed. NSR without any significant dysrhythmia  Physical examination: PHYSICAL EXAM: Vitals with BMI 03/23/2021 03/23/2021 03/23/2021  Height - - -  Weight - - 241 lbs  BMI - - 05.39  Systolic 767 341 937  Diastolic 89 79 83  Pulse 55 84 62    CONSTITUTIONAL: Age-appropriate male, hemodynamically stable, no acute distress. SKIN: Skin is warm and dry. No rash noted. No cyanosis. No pallor. No jaundice HEAD: Normocephalic and atraumatic. EYES: No scleral icterus MOUTH/THROAT: Moist oral membranes. NECK: No JVD present. No thyromegaly noted. No carotid bruits LYMPHATIC: No visible cervical adenopathy. CHEST Normal respiratory effort. No  intercostal retractions LUNGS: Clear breath sounds anteriorly with rales noted posteriorly at the bases.  No expiratory wheezes or stridor.   CARDIOVASCULAR: Regular, positive T0-W4, soft holosystolic murmur heard at the apex, no gallops or rubs. ABDOMINAL: Obese, soft, nontender, nondistended, positive bowel sounds in all 4 quadrants, no apparent ascites. EXTREMITIES: Trace bilateral peripheral edema, warm to touch HEMATOLOGIC: No significant bruising NEUROLOGIC: Oriented to person, place, and time. Nonfocal. Normal muscle tone. PSYCHIATRIC: Normal mood and affect. Normal behavior. Cooperative  Lab Results: Hematology Recent Labs  Lab 03/22/21 0608 03/23/21 0458  WBC 9.9 8.2  RBC 4.29 4.18*  HGB 13.2 12.6*  HCT 39.8 37.9*  MCV 92.8 90.7  MCH 30.8 30.1  MCHC 33.2 33.2  RDW 19.7* 19.4*  PLT 163 164    Chemistry Recent Labs  Lab 03/22/21 0608 03/23/21 0458  NA 142 140  K 4.8 4.0  CL 108 107  CO2 27 27  GLUCOSE 117* 128*  BUN 16 28*  CREATININE 1.65* 1.88*  CALCIUM 8.7* 8.8*  PROT 6.3*  --   ALBUMIN 3.4*  --   AST 33  --   ALT 35  --   ALKPHOS 58  --   BILITOT 0.7  --   GFRNONAA 46* 39*  ANIONGAP 7 6     Cardiac Enzymes: Cardiac Panel (last 3 results) Recent Labs    03/22/21 0608 03/22/21 1004  TROPONINIHS 12 13    BNP (last 3 results) Recent Labs    11/18/20 1952 02/13/21 0731 03/22/21 0608  BNP 1,079.0* 242.7* 3,939.4*    ProBNP (last  3 results) Recent Labs    01/16/21 0713 02/03/21 0717 03/06/21 0712  PROBNP 1,789* 733* 1,171*     DDimer No results for input(s): DDIMER in the last 168 hours.   Hemoglobin A1c:  Lab Results  Component Value Date   HGBA1C 5.8 (H) 03/15/2019   MPG 120 03/15/2019    TSH  Recent Labs    11/19/20 0233  TSH 1.247    Lipid Panel     Component Value Date/Time   CHOL 123 03/15/2019 0425   TRIG 130 03/15/2019 0425   HDL 32 (L) 03/15/2019 0425   CHOLHDL 3.8 03/15/2019 0425   VLDL 26 03/15/2019  0425   LDLCALC 65 03/15/2019 0425    Imaging: DG Chest Portable 1 View  Result Date: 03/22/2021 CLINICAL DATA:  65 year old male with history of shortness of breath. EXAM: PORTABLE CHEST 1 VIEW COMPARISON:  Chest x-ray 11/21/2020. FINDINGS: There is cephalization of the pulmonary vasculature and slight indistinctness of the interstitial markings suggestive of mild-to-moderate pulmonary edema. Small bilateral pleural effusions (right greater than left). No pneumothorax. Mild cardiomegaly. Numerous calcified mediastinal and bilateral hilar lymph nodes are incidentally noted. Upper mediastinal contours are within normal limits. IMPRESSION: 1. The appearance the chest suggest congestive heart failure, as above. Electronically Signed   By: Vinnie Langton M.D.   On: 03/22/2021 06:38   ECHOCARDIOGRAM COMPLETE  Result Date: 03/23/2021    ECHOCARDIOGRAM REPORT   Patient Name:   [name] Date of Exam: 03/23/2021 Medical Rec #:  [id number]       Height:       72.0 in Accession #:    [id number]      Weight:       241.0 lb Date of Birth:  Sep 21, 1955       BSA:          2.306 m Patient Age:    32 years        BP:           160/79 mmHg Patient Gender: M               HR:           59 bpm. Exam Location:  Inpatient Procedure: 2D Echo, Cardiac Doppler and Color Doppler Indications:    CHF  History:        Patient has prior history of Echocardiogram examinations, most                 recent 12/15/2020. CHF, Stroke; Risk Factors:Dyslipidemia, Former                 Smoker and Hypertension.  Sonographer:    Luisa Hart RDCS Referring Phys: [place]  1. Left ventricular ejection fraction, by estimation, is 60 to 65%. The left ventricle has normal function. The left ventricle has no regional wall motion abnormalities. There is mild left ventricular hypertrophy. Left ventricular diastolic parameters are consistent with Grade I diastolic dysfunction (impaired relaxation).  2. Right ventricular  systolic function is normal. The right ventricular size is normal. There is moderately elevated pulmonary artery systolic pressure. The estimated right ventricular systolic pressure is 85.2 mmHg.  3. Left atrial size was severely dilated.  4. The mitral valve is normal in structure. Mild mitral valve regurgitation. No evidence of mitral stenosis.  5. The aortic valve is calcified. Aortic valve regurgitation is mild. No aortic stenosis is present.  6. The inferior vena cava is dilated in size with <50% respiratory  variability, suggesting right atrial pressure of 15 mmHg. FINDINGS  Left Ventricle: Left ventricular ejection fraction, by estimation, is 60 to 65%. The left ventricle has normal function. The left ventricle has no regional wall motion abnormalities. The left ventricular internal cavity size was normal in size. There is  mild left ventricular hypertrophy. Left ventricular diastolic parameters are consistent with Grade I diastolic dysfunction (impaired relaxation). Right Ventricle: The right ventricular size is normal. No increase in right ventricular wall thickness. Right ventricular systolic function is normal. There is moderately elevated pulmonary artery systolic pressure. The tricuspid regurgitant velocity is 3.21 m/s, and with an assumed right atrial pressure of 8 mmHg, the estimated right ventricular systolic pressure is 10.1 mmHg. Left Atrium: Left atrial size was severely dilated. Right Atrium: Right atrial size was normal in size. Pericardium: There is no evidence of pericardial effusion. Mitral Valve: The mitral valve is normal in structure. Mild mitral valve regurgitation. No evidence of mitral valve stenosis. MV peak gradient, 5.5 mmHg. The mean mitral valve gradient is 2.0 mmHg. Tricuspid Valve: The tricuspid valve is normal in structure. Tricuspid valve regurgitation is mild . No evidence of tricuspid stenosis. Aortic Valve: The aortic valve is calcified. Aortic valve regurgitation is mild.  Aortic regurgitation PHT measures 497 msec. No aortic stenosis is present. Aortic valve mean gradient measures 8.0 mmHg. Aortic valve peak gradient measures 15.3 mmHg. Aortic valve area, by VTI measures 3.02 cm. Pulmonic Valve: The pulmonic valve was normal in structure. Pulmonic valve regurgitation is not visualized. No evidence of pulmonic stenosis. Aorta: The aortic root is normal in size and structure. Venous: The inferior vena cava is dilated in size with less than 50% respiratory variability, suggesting right atrial pressure of 15 mmHg. IAS/Shunts: No atrial level shunt detected by color flow Doppler.  LEFT VENTRICLE PLAX 2D LVIDd:         5.90 cm      Diastology LVIDs:         3.40 cm      LV e' medial:    6.47 cm/s LV PW:         1.40 cm      LV E/e' medial:  16.7 LV IVS:        1.20 cm      LV e' lateral:   12.20 cm/s LVOT diam:     2.30 cm      LV E/e' lateral: 8.9 LV SV:         122 LV SV Index:   53 LVOT Area:     4.15 cm  LV Volumes (MOD) LV vol d, MOD A2C: 165.0 ml LV vol d, MOD A4C: 176.0 ml LV vol s, MOD A2C: 60.7 ml LV vol s, MOD A4C: 61.7 ml LV SV MOD A2C:     104.3 ml LV SV MOD A4C:     176.0 ml LV SV MOD BP:      111.7 ml RIGHT VENTRICLE RV Basal diam:  4.40 cm RV Mid diam:    2.60 cm RV S prime:     19.50 cm/s TAPSE (M-mode): 2.8 cm LEFT ATRIUM              Index       RIGHT ATRIUM           Index LA diam:        4.50 cm  1.95 cm/m  RA Area:     18.90 cm LA Vol (A2C):   138.0 ml 59.84 ml/m RA  Volume:   44.00 ml  19.08 ml/m LA Vol (A4C):   162.0 ml 70.24 ml/m LA Biplane Vol: 161.0 ml 69.81 ml/m  AORTIC VALVE                    PULMONIC VALVE AV Area (Vmax):    3.10 cm     PV Vmax:       1.15 m/s AV Area (Vmean):   2.84 cm     PV Vmean:      83.400 cm/s AV Area (VTI):     3.02 cm     PV VTI:        0.330 m AV Vmax:           195.50 cm/s  PV Peak grad:  5.3 mmHg AV Vmean:          128.000 cm/s PV Mean grad:  3.0 mmHg AV VTI:            0.403 m AV Peak Grad:      15.3 mmHg AV Mean Grad:       8.0 mmHg LVOT Vmax:         146.00 cm/s LVOT Vmean:        87.500 cm/s LVOT VTI:          0.293 m LVOT/AV VTI ratio: 0.73 AI PHT:            497 msec AR Vena Contracta: 0.30 cm  AORTA Ao Root diam: 3.50 cm Ao Asc diam:  3.70 cm MITRAL VALVE                TRICUSPID VALVE MV Area (PHT): 3.74 cm     TR Peak grad:   41.2 mmHg MV Area VTI:   2.99 cm     TR Vmax:        321.00 cm/s MV Peak grad:  5.5 mmHg MV Mean grad:  2.0 mmHg     SHUNTS MV Vmax:       1.17 m/s     Systemic VTI:  0.29 m MV Vmean:      69.9 cm/s    Systemic Diam: 2.30 cm MV Decel Time: 203 msec MR Peak grad: 124.5 mmHg MR Mean grad: 80.0 mmHg MR Vmax:      558.00 cm/s MR Vmean:     418.0 cm/s MV E velocity: 108.00 cm/s MV A velocity: 73.30 cm/s MV E/A ratio:  1.47 Candee Furbish MD Electronically signed by Candee Furbish MD Signature Date/Time: 03/23/2021/12:18:47 PM    Final     Cardiac database: EKG: 03/22/2021: Normal sinus rhythm, 75 bpm, baseline artifact.   Echocardiogram: 12/15/2020: Normal LV systolic function with visual EF 55-60%. Left ventricle cavity is normal in size. Moderate left ventricular hypertrophy. Normal global wall motion. Indeterminate diastolic filling pattern, normal LAP.  Left atrial cavity is mildly dilated.  Mild (Grade I) aortic regurgitation.  Mild (Grade I) mitral regurgitation.  Mild tricuspid regurgitation. No evidence of pulmonary hypertension.  IVC is dilated with a respiratory response of >50%.  Prior study 11/19/2020: LVEF 60-65%, Grade I DD, Mild LAE, Trivial MR, RAP 105mmHg.  03/23/2021: LVEF 60-65%, mild LVH, grade 1 diastolic impairment, normal right ventricular size and function, moderately elevated PASP 69mmHg, severely dilated left atrium, mild MR, RAP 15 mmHg.   Stress Testing: No results found for this or any previous visit from the past 1095 days.   Heart Catheterization: None  Scheduled Meds:  aspirin  81 mg Oral Daily  atorvastatin  20 mg Oral QHS   [START ON 03/24/2021]  bumetanide (BUMEX) IV  1 mg Intravenous Daily   diltiazem  360 mg Oral Daily   docusate sodium  100 mg Oral BID   empagliflozin  10 mg Oral Daily   enoxaparin (LOVENOX) injection  0.5 mg/kg Subcutaneous Q24H   isosorbide-hydrALAZINE  1 tablet Oral TID   labetalol  300 mg Oral TID   predniSONE  5 mg Oral Daily   sacubitril-valsartan  1 tablet Oral BID   sodium chloride flush  3 mL Intravenous Q12H   tamsulosin  0.4 mg Oral Daily    Continuous Infusions:   PRN Meds: acetaminophen **OR** acetaminophen, bisacodyl, hydrALAZINE, morphine injection, ondansetron **OR** ondansetron (ZOFRAN) IV, oxyCODONE, polyethylene glycol, traZODone   IMPRESSION & RECOMMENDATIONS: [name] is a 65 y.o. male whose past medical history and cardiac risk factors include: Heart failure with preserved EF, sarcoidosis, HTN, former smoker, Hx of PE (was on Xarelto for 1 year), OSA not on CPAP, MSSA Bacteremia (March 2022), Hx of cardiac arrest (2002 during his hospitalization for pancreatitis per wife).  Acute on chronic heart failure with preserved EF, stage C, NYHA class II/III: Exacerbated secondary to dietary indiscretion with salt intake. Symptoms may be also exacerbated by uncontrolled hypertension leading to pulmonary edema. BNP elevated on admission but not accurate as he is on Entresto. Due to worsening kidney function will decrease Bumex to 1 mg IV push daily. Strict I's and O's and daily weights appear to be not accurate, spoke to the patient's nurse. Started on Jardiance yesterday 03/22/2021. Start BiDil Medications reconciled. Prior to completing his progress note patient is echocardiogram results were back.  His LVEF remained stable, grade 1 diastolic impairment, right atrial pressure is 15 mmHg suggestive of congestion.  Hypertension with chronic kidney disease stage III: Blood pressure not well controlled.   Will start BiDil as discussed above. We encouraged the importance of low-salt  diet.  History of OSA: Currently not on CPAP. Patient is asked to reschedule his appoint with Dr. Brett Fairy for sleep apnea evaluation.  History of pulmonary embolism: Currently on oral anticoagulation.  Patient's questions and concerns were addressed to his satisfaction. He voices understanding of the instructions provided during this encounter.   This note was created using a voice recognition software as a result there may be grammatical errors inadvertently enclosed that do not reflect the nature of this encounter. Every attempt is made to correct such errors.  Rex Kras, DO, [facility]. [place] Office: [phone] 03/23/2021, 1:53 PM

## 2021-03-23 NOTE — Progress Notes (Signed)
*  PRELIMINARY RESULTS* Echocardiogram 2D Echocardiogram has been performed.  Luisa Hart RDCS 03/23/2021, 10:12 AM

## 2021-03-23 NOTE — Progress Notes (Signed)
Physical Therapy Note  (Full note to follow)  SATURATION QUALIFICATIONS: (This note is used to comply with regulatory documentation for home oxygen)  Patient Saturations on Room Air at Rest = 96%  Patient Saturations on Room Air while Ambulating = 92%  No need for supplemental O2 while ambulating.  [name], [place]  Acute Rehabilitation Services Pager [phone] Office [phone]

## 2021-03-23 NOTE — Evaluation (Signed)
Physical Therapy Evaluation Patient Details Name: [name] MRN: [id number] DOB: [birth year] Today's Date: 03/23/2021   History of Present Illness  65 y.o. male presenting to ED 7/17 with SOB, worsening LE edema, 25lb weight gain and orthopnea x severeal days. Patient admitted with acute on chronic CHF.  PMHx significant for sarcoidosis on chronic steroids, CKDIII, uncontrolled HTN, Hx of PE on AC, OSA on CPAP and R rotator cuff repair 11/2020.  Clinical Impression  Patient evaluated by Physical Therapy with no further acute PT needs identified, as he is independent with progressive amb, balance and functional mobility; All education has been completed and the patient has no further questions.  See below for any follow-up Physical Therapy or equipment needs. PT is signing off. Thank you for this referral.     Follow Up Recommendations No PT follow up    Equipment Recommendations  None recommended by PT    Recommendations for Other Services       Precautions / Restrictions Precautions Precautions: None      Mobility  Bed Mobility Overal bed mobility: Independent                  Transfers Overall transfer level: Independent                  Ambulation/Gait Ambulation/Gait assistance: Independent              Stairs            Wheelchair Mobility    Modified Rankin (Stroke Patients Only)       Balance Overall balance assessment: No apparent balance deficits (not formally assessed)                                           Pertinent Vitals/Pain Pain Assessment: No/denies pain    Home Living Family/patient expects to be discharged to:: Private residence Living Arrangements: Spouse/significant other Available Help at Discharge: Family;Available 24 hours/day Type of Home: House Home Access: Level entry     Home Layout: Two level;Bed/bath upstairs Home Equipment: None      Prior Function Level of Independence:  Independent         Comments: I w/ ADLs/IADLs; drives; mows own lawn; 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; enjoys playing the piano; keeps his 4 grandsons during the day, ages 2-9yo     Hand Dominance   Dominant Hand: Right    Extremity/Trunk Assessment   Upper Extremity Assessment Upper Extremity Assessment: Defer to OT evaluation    Lower Extremity Assessment Lower Extremity Assessment: Overall WFL for tasks assessed    Cervical / Trunk Assessment Cervical / Trunk Assessment: Normal  Communication   Communication: No difficulties  Cognition Arousal/Alertness: Awake/alert Behavior During Therapy: WFL for tasks assessed/performed Overall Cognitive Status: Within Functional Limits for tasks assessed                                        General Comments General comments (skin integrity, edema, etc.): HR up to 88 bpm with stair negotiation; mild dyspnea immediately after stairs; O2 sat 94%    Exercises     Assessment/Plan    PT Assessment Patent does not need any further PT services  PT Problem List  PT Treatment Interventions      PT Goals (Current goals can be found in the Care Plan section)  Acute Rehab PT Goals Patient Stated Goal: To return home. PT Goal Formulation: All assessment and education complete, DC therapy    Frequency     Barriers to discharge        Co-evaluation               AM-PAC PT "6 Clicks" Mobility  Outcome Measure Help needed turning from your back to your side while in a flat bed without using bedrails?: None Help needed moving from lying on your back to sitting on the side of a flat bed without using bedrails?: None Help needed moving to and from a bed to a chair (including a wheelchair)?: None Help needed standing up from a chair using your arms (e.g., wheelchair or bedside chair)?: None Help needed to walk in hospital room?: None Help needed climbing 3-5 steps with a railing? : None 6  Click Score: 24    End of Session   Activity Tolerance: Patient tolerated treatment well Patient left: Other (comment) (managing independently in teh room) Nurse Communication: Mobility status PT Visit Diagnosis: Other abnormalities of gait and mobility (R26.89)    Time: 2010-0712 PT Time Calculation (min) (ACUTE ONLY): 10 min   Charges:   PT Evaluation $PT Eval Low Complexity: [place], PT  Acute Rehabilitation Services Pager [phone] Office [phone]   [place] 03/23/2021, 9:52 AM

## 2021-03-23 NOTE — Evaluation (Signed)
Occupational Therapy Evaluation Patient Details Name: [name] MRN: [id number] DOB: Dec 13, 1955 Today's Date: 03/23/2021    History of Present Illness 65 y.o. male presenting to ED 7/17 with SOB, worsening LE edema, 25lb weight gain and orthopnea x severeal days. Patient admitted with acute on chronic CHF.  PMHx significant for sarcoidosis on chronic steroids, CKDIII, uncontrolled HTN, Hx of PE on AC, OSA on CPAP and R rotator cuff repair 11/2020.   Clinical Impression   PTA patient was living with his spouse in a 2-level private residence with bedroom/bathroom on 2nd floor and was grossly I with ADLs/IADLs without AD. Patient currently functioning at baseline demonstrating observed ADLs including LB dressing and functional mobility 227ft+ with I. All education provided. Patient with no questions. Patient does not require continued acute occupational therapy services with OT to sign off at this time.      Follow Up Recommendations  No OT follow up    Equipment Recommendations  None recommended by OT    Recommendations for Other Services       Precautions / Restrictions Precautions Precautions: None      Mobility Bed Mobility Overal bed mobility: Independent                  Transfers Overall transfer level: Independent                    Balance Overall balance assessment: No apparent balance deficits (not formally assessed)                                         ADL either performed or assessed with clinical judgement   ADL Overall ADL's : Independent                                             Vision Baseline Vision/History: Wears glasses Patient Visual Report: No change from baseline Vision Assessment?: No apparent visual deficits     Perception     Praxis      Pertinent Vitals/Pain Pain Assessment: No/denies pain     Hand Dominance Right   Extremity/Trunk Assessment Upper Extremity  Assessment Upper Extremity Assessment: Overall WFL for tasks assessed   Lower Extremity Assessment Lower Extremity Assessment: Overall WFL for tasks assessed   Cervical / Trunk Assessment Cervical / Trunk Assessment: Normal   Communication Communication Communication: No difficulties   Cognition Arousal/Alertness: Awake/alert Behavior During Therapy: WFL for tasks assessed/performed Overall Cognitive Status: Within Functional Limits for tasks assessed                                     General Comments  HR 70's at rest and 80's with mobility 268ft+, SpO2 93% on RA with activity.    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 OT Goal Formulation: With patient  OT Frequency:     Barriers to D/C:            Co-evaluation              AM-PAC OT "6 Clicks" Daily Activity     Outcome Measure Help from another person eating meals?: None Help from another person taking care of personal grooming?: None Help from another person toileting, which includes using toliet, bedpan, or urinal?: None Help from another person bathing (including washing,  rinsing, drying)?: None Help from another person to put on and taking off regular upper body clothing?: None Help from another person to put on and taking off regular lower body clothing?: None 6 Click Score: 24   End of Session Equipment Utilized During Treatment: Gait belt Nurse Communication: Mobility status;Other (comment) (Ok to be independent in room.)  Activity Tolerance: Patient tolerated treatment well Patient left: in chair;with call bell/phone within reach  OT Visit Diagnosis: Muscle weakness (generalized) (M62.81)                Time: 0712-0722 OT Time Calculation (min): 10 min Charges:  OT General Charges $OT Visit: 1 Visit OT Evaluation $OT Eval Low Complexity: 1 Low  [name] H. OTR/L Supplemental OT, Department of rehab services [phone]  [name] R H. 03/23/2021, 7:45 AM

## 2021-03-23 NOTE — Progress Notes (Signed)
PROGRESS NOTE    [name]  HBZ:[id number] DOB: [birth year] DOA: 03/22/2021 PCP: [name], MD   Brief Narrative: 65 year old with past medical history significant for stage III CKD, CVA, chronic diastolic heart failure, hypertension, sarcoidosis and obesity presents complaining of shortness of breath, lower extremity edema, 25 pound weight gain orthopnea.  Bumex and BiDil recently discontinue.    Assessment & Plan:   Principal Problem:   Acute on chronic diastolic CHF (congestive heart failure) (HCC) Active Problems:   Sarcoidosis   Essential hypertension   Stage 3b chronic kidney disease   1-Acute on chronic diastolic heart failure exacerbation: -Cardiology was consulted -Echo: Normal ejection fraction, diastolic dysfunction grade 1 -Patient presented with shortness of breath, increased weight, chest x-ray abnormal. -He was a started on Bumex IV 2 mg daily.  Due to increased creatinine Bumex was  decreased to 1 mg -Strict I's and O's and daily weight On Jardiance.  Hypertension: Continue with labetalol Entresto started on BiDil today.  Continue with Cardizem.  HLD: Continue with Lipitor.  Sarcoidosis: Continue with daily prednisone.   3B CKD: Per records creatinine range 1.41.7 Mildly increased creatinine today, Bumex dose decreased  Obesity: Needs lifestyle modification       Estimated body mass index is 32.69 kg/m as calculated from the following:   Height as of this encounter: 6' (1.829 m).   Weight as of this encounter: 109.3 kg.   DVT prophylaxis: Lovenox Code Status: Full code Family Communication: Discussed with patient Disposition Plan:  Status is: Inpatient  Remains inpatient appropriate because:IV treatments appropriate due to intensity of illness or inability to take PO  Dispo: The patient is from: Home              Anticipated d/c is to: Home              Patient currently is not medically stable to d/c.   Difficult to place patient  No        Consultants:  Cardiology  Procedures:  Echo: Normal ejection fraction, diastolic dysfunction grade 1  Antimicrobials:    Subjective: Feeling better, he denies worsening shortness of breath.  Objective: Vitals:   03/22/21 1806 03/22/21 2014 03/23/21 0026 03/23/21 0448  BP: (!) 155/89 (!) 147/83 (!) 154/90 (!) 159/83  Pulse: 69 62 (!) 55 62  Resp: 16 17 16 17   Temp: 98.3 F (36.8 C) 98 F (36.7 C) 97.9 F (36.6 C) 97.9 F (36.6 C)  TempSrc: Oral Oral Oral Oral  SpO2: 99% 97% 95% 95%  Weight:    109.3 kg  Height:       No intake or output data in the 24 hours ending 03/23/21 0752 Filed Weights   03/22/21 0741 03/23/21 0448  Weight: 101.3 kg 109.3 kg    Examination:  General exam: Appears calm and comfortable  Respiratory system: Bilateral crackles Cardiovascular system: S1 & S2 heard, RRR. No JVD, murmurs, rubs, gallops or clicks. No pedal edema. Gastrointestinal system: Abdomen is nondistended, soft and nontender. No organomegaly or masses felt. Normal bowel sounds heard. Central nervous system: Alert and oriented.  Extremities: Symmetric 5 x 5 power.    Data Reviewed: I have personally reviewed following labs and imaging studies  CBC: Recent Labs  Lab 03/22/21 0608 03/23/21 0458  WBC 9.9 8.2  NEUTROABS 7.5  --   HGB 13.2 12.6*  HCT 39.8 37.9*  MCV 92.8 90.7  PLT 163 101   Basic Metabolic Panel: Recent Labs  Lab 03/22/21 0608  03/23/21 0458  NA 142 140  K 4.8 4.0  CL 108 107  CO2 27 27  GLUCOSE 117* 128*  BUN 16 28*  CREATININE 1.65* 1.88*  CALCIUM 8.7* 8.8*   GFR: Estimated Creatinine Clearance: 50.7 mL/min (A) (by C-G formula based on SCr of 1.88 mg/dL (H)). Liver Function Tests: Recent Labs  Lab 03/22/21 0608  AST 33  ALT 35  ALKPHOS 58  BILITOT 0.7  PROT 6.3*  ALBUMIN 3.4*   No results for input(s): LIPASE, AMYLASE in the last 168 hours. No results for input(s): AMMONIA in the last 168 hours. Coagulation  Profile: No results for input(s): INR, PROTIME in the last 168 hours. Cardiac Enzymes: No results for input(s): CKTOTAL, CKMB, CKMBINDEX, TROPONINI in the last 168 hours. BNP (last 3 results) Recent Labs    01/16/21 0713 02/03/21 0717 03/06/21 0712  PROBNP 1,789* 733* 1,171*   HbA1C: No results for input(s): HGBA1C in the last 72 hours. CBG: No results for input(s): GLUCAP in the last 168 hours. Lipid Profile: No results for input(s): CHOL, HDL, LDLCALC, TRIG, CHOLHDL, LDLDIRECT in the last 72 hours. Thyroid Function Tests: No results for input(s): TSH, T4TOTAL, FREET4, T3FREE, THYROIDAB in the last 72 hours. Anemia Panel: No results for input(s): VITAMINB12, FOLATE, FERRITIN, TIBC, IRON, RETICCTPCT in the last 72 hours. Sepsis Labs: No results for input(s): PROCALCITON, LATICACIDVEN in the last 168 hours.  Recent Results (from the past 240 hour(s))  Resp Panel by RT-PCR (Flu A&B, Covid) Nasopharyngeal Swab     Status: None   Collection Time: 03/22/21  6:08 AM   Specimen: Nasopharyngeal Swab; Nasopharyngeal(NP) swabs in vial transport medium  Result Value Ref Range Status   SARS Coronavirus 2 by RT PCR NEGATIVE NEGATIVE Final    Comment: (NOTE) SARS-CoV-2 target nucleic acids are NOT DETECTED.  The SARS-CoV-2 RNA is generally detectable in upper respiratory specimens during the acute phase of infection. The lowest concentration of SARS-CoV-2 viral copies this assay can detect is 138 copies/mL. A negative result does not preclude SARS-Cov-2 infection and should not be used as the sole basis for treatment or other patient management decisions. A negative result may occur with  improper specimen collection/handling, submission of specimen other than nasopharyngeal swab, presence of viral mutation(s) within the areas targeted by this assay, and inadequate number of viral copies(<138 copies/mL). A negative result must be combined with clinical observations, patient history, and  epidemiological information. The expected result is Negative.  Fact Sheet for Patients:  EntrepreneurPulse.com.au  Fact Sheet for Healthcare Providers:  IncredibleEmployment.be  This test is no t yet approved or cleared by the [place] FDA and  has been authorized for detection and/or diagnosis of SARS-CoV-2 by FDA under an Emergency Use Authorization (EUA). This EUA will remain  in effect (meaning this test can be used) for the duration of the COVID-19 declaration under Section 564(b)(1) of the Act, 21 U.S.C.section 360bbb-3(b)(1), unless the authorization is terminated  or revoked sooner.       Influenza A by PCR NEGATIVE NEGATIVE Final   Influenza B by PCR NEGATIVE NEGATIVE Final    Comment: (NOTE) The Xpert Xpress SARS-CoV-2/FLU/RSV plus assay is intended as an aid in the diagnosis of influenza from Nasopharyngeal swab specimens and should not be used as a sole basis for treatment. Nasal washings and aspirates are unacceptable for Xpert Xpress SARS-CoV-2/FLU/RSV testing.  Fact Sheet for Patients: EntrepreneurPulse.com.au  Fact Sheet for Healthcare Providers: IncredibleEmployment.be  This test is not yet approved or cleared  by the [place] and has been authorized for detection and/or diagnosis of SARS-CoV-2 by FDA under an Emergency Use Authorization (EUA). This EUA will remain in effect (meaning this test can be used) for the duration of the COVID-19 declaration under Section 564(b)(1) of the Act, 21 U.S.C. section 360bbb-3(b)(1), unless the authorization is terminated or revoked.  Performed at [facility], [place] [address] [place], [place] [address]          Radiology Studies: DG Chest Portable 1 View  Result Date: 03/22/2021 CLINICAL DATA:  65 year old male with history of shortness of breath. EXAM: PORTABLE CHEST 1 VIEW COMPARISON:  Chest x-ray 11/21/2020. FINDINGS:  There is cephalization of the pulmonary vasculature and slight indistinctness of the interstitial markings suggestive of mild-to-moderate pulmonary edema. Small bilateral pleural effusions (right greater than left). No pneumothorax. Mild cardiomegaly. Numerous calcified mediastinal and bilateral hilar lymph nodes are incidentally noted. Upper mediastinal contours are within normal limits. IMPRESSION: 1. The appearance the chest suggest congestive heart failure, as above. Electronically Signed   By: Vinnie Langton M.D.   On: 03/22/2021 06:38        Scheduled Meds:  aspirin  81 mg Oral Daily   atorvastatin  20 mg Oral QHS   bumetanide (BUMEX) IV  1 mg Intravenous Q12H   diltiazem  360 mg Oral Daily   docusate sodium  100 mg Oral BID   empagliflozin  10 mg Oral Daily   enoxaparin (LOVENOX) injection  0.5 mg/kg Subcutaneous Q24H   labetalol  300 mg Oral TID   predniSONE  5 mg Oral Daily   sacubitril-valsartan  1 tablet Oral BID   sodium chloride flush  3 mL Intravenous Q12H   tamsulosin  0.4 mg Oral Daily   Continuous Infusions:   LOS: 1 day    Time spent: 35 minutes     Cartel Mauss A Leonna Schlee, MD [facility]   If 7PM-7AM, please contact night-coverage [email or web address]  03/23/2021, 7:52 AM

## 2021-03-24 LAB — BASIC METABOLIC PANEL
Anion gap: 6 (ref 5–15)
BUN: 24 mg/dL — ABNORMAL HIGH (ref 8–23)
CO2: 25 mmol/L (ref 22–32)
Calcium: 8.4 mg/dL — ABNORMAL LOW (ref 8.9–10.3)
Chloride: 110 mmol/L (ref 98–111)
Creatinine, Ser: 1.58 mg/dL — ABNORMAL HIGH (ref 0.61–1.24)
GFR, Estimated: 49 mL/min — ABNORMAL LOW (ref >60)
Glucose, Bld: 120 mg/dL — ABNORMAL HIGH (ref 70–99)
Potassium: 3.6 mmol/L (ref 3.5–5.1)
Sodium: 141 mmol/L (ref 135–145)

## 2021-03-24 LAB — BRAIN NATRIURETIC PEPTIDE: B Natriuretic Peptide: 1944.7 pg/mL — ABNORMAL HIGH (ref 0.0–100.0)

## 2021-03-24 MED ORDER — EMPAGLIFLOZIN 10 MG PO TABS: 10.0000 mg | ORAL_TABLET | Freq: Every day | ORAL | 1 refills | Status: DC

## 2021-03-24 MED ORDER — ISOSORB DINITRATE-HYDRALAZINE 20-37.5 MG PO TABS: 1.0000 | ORAL_TABLET | Freq: Three times a day (TID) | ORAL | 3 refills | Status: DC

## 2021-03-24 MED ORDER — DOCUSATE SODIUM 100 MG PO CAPS: 100.0000 mg | ORAL_CAPSULE | Freq: Two times a day (BID) | ORAL | 0 refills | Status: DC

## 2021-03-24 NOTE — Plan of Care (Signed)
Pt understanding of discharge instructions  

## 2021-03-24 NOTE — Progress Notes (Signed)
Progress Note  Patient Name: [name] Date of Encounter: 03/24/2021  Attending physician: Elmarie Shiley, MD Primary care provider: [name], MD Primary Cardiologist:  Consultant:Staceyann Knouff Terri Skains, DO  Subjective: [name] is a 65 y.o. male who was seen and examined at bedside. Patient wishes to be discharged home later today. Denies shortness of breath, chest pain, orthopnea, paroxysmal nocturnal dyspnea or lower extremity swelling.   Currently on room air. Telemetry reviewed. Case discussed and reviewed with his nurse.  Objective: Vital Signs in the last 24 hours: Temp:  [97.6 F (36.4 C)-98.1 F (36.7 C)] 98.1 F (36.7 C) (07/19 0405) Pulse Rate:  [55-62] 60 (07/19 0405) Resp:  [17-18] 18 (07/19 0405) BP: (143-155)/(81-90) 143/85 (07/19 0405) SpO2:  [93 %-97 %] 96 % (07/19 0405) Weight:  [108.3 kg] 108.3 kg (07/19 0452)  Intake/Output:  Intake/Output Summary (Last 24 hours) at 03/24/2021 0850 Last data filed at 03/24/2021 0409 Gross per 24 hour  Intake 240 ml  Output 1300 ml  Net -1060 ml    Net IO Since Admission: -525 mL [03/24/21 0850]  Weights:  Filed Weights   03/22/21 0741 03/23/21 0448 03/24/21 0452  Weight: 101.3 kg 109.3 kg 108.3 kg    Telemetry: Personally reviewed.  Physical examination: PHYSICAL EXAM: Vitals with BMI 03/24/2021 03/24/2021 03/24/2021  Height - - -  Weight 238 lbs 13 oz - -  BMI 67.12 - -  Systolic - 458 099  Diastolic - 85 89  Pulse - 60 59    CONSTITUTIONAL: Age-appropriate male, hemodynamically stable, no acute distress. SKIN: Skin is warm and dry. No rash noted. No cyanosis. No pallor. No jaundice HEAD: Normocephalic and atraumatic. EYES: No scleral icterus MOUTH/THROAT: Moist oral membranes. NECK: No JVD present. No thyromegaly noted. No carotid bruits LYMPHATIC: No visible cervical adenopathy. CHEST Normal respiratory effort. No intercostal retractions LUNGS: Clear breath sounds anteriorly with rales  noted posteriorly at the bases.  No expiratory wheezes or stridor.   CARDIOVASCULAR: Regular, positive I3-J8, soft holosystolic murmur heard at the apex, no gallops or rubs. ABDOMINAL: Obese, soft, nontender, nondistended, positive bowel sounds in all 4 quadrants, no apparent ascites. EXTREMITIES: Trace bilateral peripheral edema, warm to touch HEMATOLOGIC: No significant bruising NEUROLOGIC: Oriented to person, place, and time. Nonfocal. Normal muscle tone. PSYCHIATRIC: Normal mood and affect. Normal behavior. Cooperative  Lab Results: Hematology Recent Labs  Lab 03/22/21 0608 03/23/21 0458  WBC 9.9 8.2  RBC 4.29 4.18*  HGB 13.2 12.6*  HCT 39.8 37.9*  MCV 92.8 90.7  MCH 30.8 30.1  MCHC 33.2 33.2  RDW 19.7* 19.4*  PLT 163 164    Chemistry Recent Labs  Lab 03/22/21 0608 03/23/21 0458 03/24/21 0438  NA 142 140 141  K 4.8 4.0 3.6  CL 108 107 110  CO2 27 27 25   GLUCOSE 117* 128* 120*  BUN 16 28* 24*  CREATININE 1.65* 1.88* 1.58*  CALCIUM 8.7* 8.8* 8.4*  PROT 6.3*  --   --   ALBUMIN 3.4*  --   --   AST 33  --   --   ALT 35  --   --   ALKPHOS 58  --   --   BILITOT 0.7  --   --   GFRNONAA 46* 39* 49*  ANIONGAP 7 6 6      Cardiac Enzymes: Cardiac Panel (last 3 results) Recent Labs    03/22/21 0608 03/22/21 1004  TROPONINIHS 12 13    BNP (last 3 results) Recent Labs  11/18/20 1952 02/13/21 0731 03/22/21 0608  BNP 1,079.0* 242.7* 3,939.4*    ProBNP (last 3 results) Recent Labs    01/16/21 0713 02/03/21 0717 03/06/21 0712  PROBNP 1,789* 733* 1,171*     DDimer No results for input(s): DDIMER in the last 168 hours.   Hemoglobin A1c:  Lab Results  Component Value Date   HGBA1C 5.8 (H) 03/15/2019   MPG 120 03/15/2019    TSH  Recent Labs    11/19/20 0233  TSH 1.247    Lipid Panel     Component Value Date/Time   CHOL 123 03/15/2019 0425   TRIG 130 03/15/2019 0425   HDL 32 (L) 03/15/2019 0425   CHOLHDL 3.8 03/15/2019 0425   VLDL 26  03/15/2019 0425   LDLCALC 65 03/15/2019 0425    Imaging: ECHOCARDIOGRAM COMPLETE  Result Date: 03/23/2021    ECHOCARDIOGRAM REPORT   Patient Name:   [name] Date of Exam: 03/23/2021 Medical Rec #:  [id number]       Height:       72.0 in Accession #:    [id number]      Weight:       241.0 lb Date of Birth:  Jun 09, 1956       BSA:          2.306 m Patient Age:    33 years        BP:           160/79 mmHg Patient Gender: M               HR:           59 bpm. Exam Location:  Inpatient Procedure: 2D Echo, Cardiac Doppler and Color Doppler Indications:    CHF  History:        Patient has prior history of Echocardiogram examinations, most                 recent 12/15/2020. CHF, Stroke; Risk Factors:Dyslipidemia, Former                 Smoker and Hypertension.  Sonographer:    Luisa Hart RDCS Referring Phys: [place]  1. Left ventricular ejection fraction, by estimation, is 60 to 65%. The left ventricle has normal function. The left ventricle has no regional wall motion abnormalities. There is mild left ventricular hypertrophy. Left ventricular diastolic parameters are consistent with Grade I diastolic dysfunction (impaired relaxation).  2. Right ventricular systolic function is normal. The right ventricular size is normal. There is moderately elevated pulmonary artery systolic pressure. The estimated right ventricular systolic pressure is 93.2 mmHg.  3. Left atrial size was severely dilated.  4. The mitral valve is normal in structure. Mild mitral valve regurgitation. No evidence of mitral stenosis.  5. The aortic valve is calcified. Aortic valve regurgitation is mild. No aortic stenosis is present.  6. The inferior vena cava is dilated in size with <50% respiratory variability, suggesting right atrial pressure of 15 mmHg. FINDINGS  Left Ventricle: Left ventricular ejection fraction, by estimation, is 60 to 65%. The left ventricle has normal function. The left ventricle has no regional  wall motion abnormalities. The left ventricular internal cavity size was normal in size. There is  mild left ventricular hypertrophy. Left ventricular diastolic parameters are consistent with Grade I diastolic dysfunction (impaired relaxation). Right Ventricle: The right ventricular size is normal. No increase in right ventricular wall thickness. Right ventricular systolic function is normal. There is moderately elevated  pulmonary artery systolic pressure. The tricuspid regurgitant velocity is 3.21 m/s, and with an assumed right atrial pressure of 8 mmHg, the estimated right ventricular systolic pressure is 16.0 mmHg. Left Atrium: Left atrial size was severely dilated. Right Atrium: Right atrial size was normal in size. Pericardium: There is no evidence of pericardial effusion. Mitral Valve: The mitral valve is normal in structure. Mild mitral valve regurgitation. No evidence of mitral valve stenosis. MV peak gradient, 5.5 mmHg. The mean mitral valve gradient is 2.0 mmHg. Tricuspid Valve: The tricuspid valve is normal in structure. Tricuspid valve regurgitation is mild . No evidence of tricuspid stenosis. Aortic Valve: The aortic valve is calcified. Aortic valve regurgitation is mild. Aortic regurgitation PHT measures 497 msec. No aortic stenosis is present. Aortic valve mean gradient measures 8.0 mmHg. Aortic valve peak gradient measures 15.3 mmHg. Aortic valve area, by VTI measures 3.02 cm. Pulmonic Valve: The pulmonic valve was normal in structure. Pulmonic valve regurgitation is not visualized. No evidence of pulmonic stenosis. Aorta: The aortic root is normal in size and structure. Venous: The inferior vena cava is dilated in size with less than 50% respiratory variability, suggesting right atrial pressure of 15 mmHg. IAS/Shunts: No atrial level shunt detected by color flow Doppler.  LEFT VENTRICLE PLAX 2D LVIDd:         5.90 cm      Diastology LVIDs:         3.40 cm      LV e' medial:    6.47 cm/s LV PW:          1.40 cm      LV E/e' medial:  16.7 LV IVS:        1.20 cm      LV e' lateral:   12.20 cm/s LVOT diam:     2.30 cm      LV E/e' lateral: 8.9 LV SV:         122 LV SV Index:   53 LVOT Area:     4.15 cm  LV Volumes (MOD) LV vol d, MOD A2C: 165.0 ml LV vol d, MOD A4C: 176.0 ml LV vol s, MOD A2C: 60.7 ml LV vol s, MOD A4C: 61.7 ml LV SV MOD A2C:     104.3 ml LV SV MOD A4C:     176.0 ml LV SV MOD BP:      111.7 ml RIGHT VENTRICLE RV Basal diam:  4.40 cm RV Mid diam:    2.60 cm RV S prime:     19.50 cm/s TAPSE (M-mode): 2.8 cm LEFT ATRIUM              Index       RIGHT ATRIUM           Index LA diam:        4.50 cm  1.95 cm/m  RA Area:     18.90 cm LA Vol (A2C):   138.0 ml 59.84 ml/m RA Volume:   44.00 ml  19.08 ml/m LA Vol (A4C):   162.0 ml 70.24 ml/m LA Biplane Vol: 161.0 ml 69.81 ml/m  AORTIC VALVE                    PULMONIC VALVE AV Area (Vmax):    3.10 cm     PV Vmax:       1.15 m/s AV Area (Vmean):   2.84 cm     PV Vmean:      83.400 cm/s AV Area (  VTI):     3.02 cm     PV VTI:        0.330 m AV Vmax:           195.50 cm/s  PV Peak grad:  5.3 mmHg AV Vmean:          128.000 cm/s PV Mean grad:  3.0 mmHg AV VTI:            0.403 m AV Peak Grad:      15.3 mmHg AV Mean Grad:      8.0 mmHg LVOT Vmax:         146.00 cm/s LVOT Vmean:        87.500 cm/s LVOT VTI:          0.293 m LVOT/AV VTI ratio: 0.73 AI PHT:            497 msec AR Vena Contracta: 0.30 cm  AORTA Ao Root diam: 3.50 cm Ao Asc diam:  3.70 cm MITRAL VALVE                TRICUSPID VALVE MV Area (PHT): 3.74 cm     TR Peak grad:   41.2 mmHg MV Area VTI:   2.99 cm     TR Vmax:        321.00 cm/s MV Peak grad:  5.5 mmHg MV Mean grad:  2.0 mmHg     SHUNTS MV Vmax:       1.17 m/s     Systemic VTI:  0.29 m MV Vmean:      69.9 cm/s    Systemic Diam: 2.30 cm MV Decel Time: 203 msec MR Peak grad: 124.5 mmHg MR Mean grad: 80.0 mmHg MR Vmax:      558.00 cm/s MR Vmean:     418.0 cm/s MV E velocity: 108.00 cm/s MV A velocity: 73.30 cm/s MV E/A ratio:  1.47  Candee Furbish MD Electronically signed by Candee Furbish MD Signature Date/Time: 03/23/2021/12:18:47 PM    Final     Cardiac database: EKG: 03/22/2021: Normal sinus rhythm, 75 bpm, baseline artifact.   Echocardiogram: 12/15/2020: Normal LV systolic function with visual EF 55-60%. Left ventricle cavity is normal in size. Moderate left ventricular hypertrophy. Normal global wall motion. Indeterminate diastolic filling pattern, normal LAP.  Left atrial cavity is mildly dilated.  Mild (Grade I) aortic regurgitation.  Mild (Grade I) mitral regurgitation.  Mild tricuspid regurgitation. No evidence of pulmonary hypertension.  IVC is dilated with a respiratory response of >50%.  Prior study 11/19/2020: LVEF 60-65%, Grade I DD, Mild LAE, Trivial MR, RAP 61mmHg.   03/23/2021: LVEF 60-65%, mild LVH, grade 1 diastolic impairment, normal right ventricular size and function, moderately elevated PASP 10mmHg, severely dilated left atrium, mild MR, RAP 15 mmHg.   Stress Testing: No results found for this or any previous visit from the past 1095 days.   Heart Catheterization: None  Scheduled Meds:  aspirin  81 mg Oral Daily   atorvastatin  20 mg Oral QHS   diltiazem  360 mg Oral Daily   docusate sodium  100 mg Oral BID   empagliflozin  10 mg Oral Daily   enoxaparin (LOVENOX) injection  0.5 mg/kg Subcutaneous Q24H   isosorbide-hydrALAZINE  1 tablet Oral TID   labetalol  300 mg Oral TID   predniSONE  5 mg Oral Daily   sacubitril-valsartan  1 tablet Oral BID   sodium chloride flush  3 mL Intravenous Q12H   tamsulosin  0.4 mg Oral  Daily    Continuous Infusions:   PRN Meds: acetaminophen **OR** acetaminophen, bisacodyl, hydrALAZINE, morphine injection, ondansetron **OR** ondansetron (ZOFRAN) IV, oxyCODONE, polyethylene glycol, traZODone   IMPRESSION & RECOMMENDATIONS: [name] is a 66 y.o. male whose past medical history and cardiac risk factors include: Heart failure with preserved EF,  sarcoidosis, HTN, former smoker, Hx of PE (was on Xarelto for 1 year), OSA not on CPAP, MSSA Bacteremia (March 2022), Hx of cardiac arrest (2002 during his hospitalization for pancreatitis per wife).  Acute on chronic heart failure with preserved EF, stage C, NYHA class II: Exacerbation was most likely due to dietary indiscretion with salt intake. Echocardiogram notes preserved LVEF without any significant valvular heart disease. Telemetry reviewed.  Overall normal sinus rhythm with one episode of 4 beat NSVT while he was asleep.  Patient was asymptomatic.  Most likely secondary to undiagnosed/untreated sleep apnea.  Patient is already on AV nodal blocking agents.  Would recommend outpatient stress test. Tolerated the initiation of Jardiance and BiDil well without any side effects or intolerances.  Hypertension with chronic kidney disease stage III: Blood pressures overall improving since hospitalization. Tolerated initiation of BiDil well. Encouraged the importance of a low-salt diet  History of OSA: Currently does not use a CPAP. I have asked him to reach out to Dr. Brett Fairy to reschedule his consultation.  History of pulmonary embolism currently on oral anticoagulation.  From a cardiovascular standpoint patient can be discharged home with close follow-up.  He is encouraged to decrease the consumption of high salt foods and the importance of medication compliance to help prevent hospitalizations.  Patient is also asked to keep a log of his blood pressures and to bring it in at the next office visit.  Patient is thankful for the care and attention provided to him during his hospitalization.  A follow-up appointment has been made and depart has been updated.  Patient's questions and concerns were addressed to his satisfaction. He voices understanding of the instructions provided during this encounter.   This note was created using a voice recognition software as a result there may be  grammatical errors inadvertently enclosed that do not reflect the nature of this encounter. Every attempt is made to correct such errors.  Rex Kras, DO, [facility]. [place] Office: [phone] 03/24/2021, 8:50 AM

## 2021-03-24 NOTE — Progress Notes (Signed)
[employer] Founds to be D/C'd Home per MD order.  Discussed with the patient and all questions fully answered.   VSS, Skin clean, dry and intact without evidence of skin break down, no evidence of skin tears noted. IV catheter discontinued intact. Site without signs and symptoms of complications. Dressing and pressure applied.   An After Visit Summary was printed and given to the patient.    D/C education completed with patient/family including follow up instructions, medication list, d/c activities limitations if indicated, with other d/c instructions as indicated by MD - patient able to verbalize understanding, all questions fully answered.    Patient instructed to return to ED, call 911, or call MD for any changes in condition.    Patient escorted via [place], and D/C home via car.

## 2021-03-24 NOTE — Progress Notes (Signed)
Patient given discharge instructions and stated understanding. 

## 2021-03-24 NOTE — Discharge Summary (Signed)
Physician Discharge Summary  [name]:[id number] DOB: 09/05/1956 DOA: 03/22/2021  PCP: Manuel Baton, MD  Admit date: 03/22/2021 Discharge date: 03/24/2021  Admitted From: Home  Disposition:  Home   Recommendations for Outpatient Follow-up:  Follow up with PCP in 1-2 weeks Please obtain BMP/CBC in one week Follow up with cardiology for further care of HF>     Discharge Condition: Stable.Marland Kitchen  CODE STATUS:Full code Diet recommendation: Heart Healthy   Brief/Interim Summary: 65 year old with past medical history significant for stage III CKD, CVA, chronic diastolic heart failure, hypertension, sarcoidosis and obesity presents complaining of shortness of breath, lower extremity edema, 25 pound weight gain orthopnea.  Bumex and BiDil recently discontinue.   1-Acute on chronic diastolic heart failure exacerbation: -Cardiology was consulted -Echo: Normal ejection fraction, diastolic dysfunction grade 1 -Patient presented with shortness of breath, increased weight, chest x-ray abnormal. -He was a started on Bumex IV 2 mg daily.  Due to increased creatinine Bumex was  decreased to 1 mg on 7/19. Discussed with cardiology patient does not need to be discharge on Bumex.  Discharge on entresto, jardiance and Bidi.  -Strict I's and O's and daily weight Patient feeling well, weight down to 238.  Stable for discharge. We discussed low sodium diet    Hypertension: Continue with labetalol Entresto,  BiDil .  Continue with Cardizem. BP better controlled.   HLD: Continue with Lipitor.   Sarcoidosis: Continue with daily prednisone.     3B CKD: Per records creatinine range 1.41.7 Mildly increased creatinine on 7/18 , Bumex dose decreased Cr down to 1.5, patient baseline.   Obesity: Needs lifestyle modification         Discharge Diagnoses:  Principal Problem:   Acute on chronic diastolic CHF (congestive heart failure) (HCC) Active Problems:   Sarcoidosis   Essential  hypertension   Stage 3b chronic kidney disease    Discharge Instructions  Discharge Instructions     Diet - low sodium heart healthy   Complete by: As directed    Increase activity slowly   Complete by: As directed       Allergies as of 03/24/2021       Reactions   Codeine Nausea Only   Hydromorphone Other (See Comments)   hallucinations         Medication List     STOP taking these medications    Klor-Con M20 20 MEQ tablet Generic drug: potassium chloride SA       TAKE these medications    ALKA-SELTZER PLUS COLD PO Take 2 tablets by mouth every 6 (six) hours as needed (cold symptoms).   aspirin 81 MG tablet Take 1 tablet (81 mg total) by mouth daily.   atorvastatin 20 MG tablet Commonly known as: LIPITOR Take 20 mg by mouth daily.   BLACK ELDERBERRY PO Take 2 each by mouth daily as needed (immune support). Gummies   diltiazem 360 MG 24 hr capsule Commonly known as: CARDIZEM CD Take 360 mg by mouth daily.   diphenhydrAMINE 25 mg capsule Commonly known as: BENADRYL Take 25 mg by mouth every 6 (six) hours as needed.   docusate sodium 100 MG capsule Commonly known as: COLACE Take 1 capsule (100 mg total) by mouth 2 (two) times daily.   empagliflozin 10 MG Tabs tablet Commonly known as: JARDIANCE Take 1 tablet (10 mg total) by mouth daily. Start taking on: March 25, 2021   Entresto 97-103 MG Generic drug: sacubitril-valsartan Take 1 tablet by mouth 2 (two) times  daily.   fluticasone 50 MCG/ACT nasal spray Commonly known as: FLONASE Place 2 sprays into both nostrils daily as needed for allergies or rhinitis.   isosorbide-hydrALAZINE 20-37.5 MG tablet Commonly known as: BIDIL Take 1 tablet by mouth 3 (three) times daily.   labetalol 300 MG tablet Commonly known as: NORMODYNE Take 300 mg by mouth 3 (three) times daily.   predniSONE 5 MG tablet Commonly known as: DELTASONE Take 5 mg by mouth daily. Reported on 11/02/2015   tamsulosin 0.4  MG Caps capsule Commonly known as: FLOMAX Take 0.4 mg by mouth daily.   traZODone 150 MG tablet Commonly known as: DESYREL Take 1 tablet (150 mg total) by mouth at bedtime as needed for sleep.        Follow-up Information     Manuel Kras, DO Follow up on 04/06/2021.   Specialties: Cardiology, Vascular Surgery Why: 10:30 Bring cardiac medication bottles with you. Bring a log of your blood pressure and weight. The labs prior to the office visit. Contact information: [name] [address] [phone]                Allergies  Allergen Reactions   Codeine Nausea Only   Hydromorphone Other (See Comments)    hallucinations     Consultations: Cardiology    Procedures/Studies: DG Chest Portable 1 View  Result Date: 03/22/2021 CLINICAL DATA:  65 year old male with history of shortness of breath. EXAM: PORTABLE CHEST 1 VIEW COMPARISON:  Chest x-ray 11/21/2020. FINDINGS: There is cephalization of the pulmonary vasculature and slight indistinctness of the interstitial markings suggestive of mild-to-moderate pulmonary edema. Small bilateral pleural effusions (right greater than left). No pneumothorax. Mild cardiomegaly. Numerous calcified mediastinal and bilateral hilar lymph nodes are incidentally noted. Upper mediastinal contours are within normal limits. IMPRESSION: 1. The appearance the chest suggest congestive heart failure, as above. Electronically Signed   By: Vinnie Langton M.D.   On: 03/22/2021 06:38   ECHOCARDIOGRAM COMPLETE  Result Date: 03/23/2021    ECHOCARDIOGRAM REPORT   Patient Name:   [name] Date of Exam: 03/23/2021 Medical Rec #:  [id number]       Height:       72.0 in Accession #:    [id number]      Weight:       241.0 lb Date of Birth:  08/06/1956       BSA:          2.306 m Patient Age:    80 years        BP:           160/79 mmHg Patient Gender: M               HR:           59 bpm. Exam Location:  Inpatient Procedure: 2D  Echo, Cardiac Doppler and Color Doppler Indications:    CHF  History:        Patient has prior history of Echocardiogram examinations, most                 recent 12/15/2020. CHF, Stroke; Risk Factors:Dyslipidemia, Former                 Smoker and Hypertension.  Sonographer:    Luisa Hart RDCS Referring Phys: [place]  1. Left ventricular ejection fraction, by estimation, is 60 to 65%. The left ventricle has normal function. The left ventricle has no regional wall motion abnormalities.  There is mild left ventricular hypertrophy. Left ventricular diastolic parameters are consistent with Grade I diastolic dysfunction (impaired relaxation).  2. Right ventricular systolic function is normal. The right ventricular size is normal. There is moderately elevated pulmonary artery systolic pressure. The estimated right ventricular systolic pressure is 63.8 mmHg.  3. Left atrial size was severely dilated.  4. The mitral valve is normal in structure. Mild mitral valve regurgitation. No evidence of mitral stenosis.  5. The aortic valve is calcified. Aortic valve regurgitation is mild. No aortic stenosis is present.  6. The inferior vena cava is dilated in size with <50% respiratory variability, suggesting right atrial pressure of 15 mmHg. FINDINGS  Left Ventricle: Left ventricular ejection fraction, by estimation, is 60 to 65%. The left ventricle has normal function. The left ventricle has no regional wall motion abnormalities. The left ventricular internal cavity size was normal in size. There is  mild left ventricular hypertrophy. Left ventricular diastolic parameters are consistent with Grade I diastolic dysfunction (impaired relaxation). Right Ventricle: The right ventricular size is normal. No increase in right ventricular wall thickness. Right ventricular systolic function is normal. There is moderately elevated pulmonary artery systolic pressure. The tricuspid regurgitant velocity is 3.21 m/s, and  with an assumed right atrial pressure of 8 mmHg, the estimated right ventricular systolic pressure is 75.6 mmHg. Left Atrium: Left atrial size was severely dilated. Right Atrium: Right atrial size was normal in size. Pericardium: There is no evidence of pericardial effusion. Mitral Valve: The mitral valve is normal in structure. Mild mitral valve regurgitation. No evidence of mitral valve stenosis. MV peak gradient, 5.5 mmHg. The mean mitral valve gradient is 2.0 mmHg. Tricuspid Valve: The tricuspid valve is normal in structure. Tricuspid valve regurgitation is mild . No evidence of tricuspid stenosis. Aortic Valve: The aortic valve is calcified. Aortic valve regurgitation is mild. Aortic regurgitation PHT measures 497 msec. No aortic stenosis is present. Aortic valve mean gradient measures 8.0 mmHg. Aortic valve peak gradient measures 15.3 mmHg. Aortic valve area, by VTI measures 3.02 cm. Pulmonic Valve: The pulmonic valve was normal in structure. Pulmonic valve regurgitation is not visualized. No evidence of pulmonic stenosis. Aorta: The aortic root is normal in size and structure. Venous: The inferior vena cava is dilated in size with less than 50% respiratory variability, suggesting right atrial pressure of 15 mmHg. IAS/Shunts: No atrial level shunt detected by color flow Doppler.  LEFT VENTRICLE PLAX 2D LVIDd:         5.90 cm      Diastology LVIDs:         3.40 cm      LV e' medial:    6.47 cm/s LV PW:         1.40 cm      LV E/e' medial:  16.7 LV IVS:        1.20 cm      LV e' lateral:   12.20 cm/s LVOT diam:     2.30 cm      LV E/e' lateral: 8.9 LV SV:         122 LV SV Index:   53 LVOT Area:     4.15 cm  LV Volumes (MOD) LV vol d, MOD A2C: 165.0 ml LV vol d, MOD A4C: 176.0 ml LV vol s, MOD A2C: 60.7 ml LV vol s, MOD A4C: 61.7 ml LV SV MOD A2C:     104.3 ml LV SV MOD A4C:     176.0 ml LV SV  MOD BP:      111.7 ml RIGHT VENTRICLE RV Basal diam:  4.40 cm RV Mid diam:    2.60 cm RV S prime:     19.50 cm/s TAPSE  (M-mode): 2.8 cm LEFT ATRIUM              Index       RIGHT ATRIUM           Index LA diam:        4.50 cm  1.95 cm/m  RA Area:     18.90 cm LA Vol (A2C):   138.0 ml 59.84 ml/m RA Volume:   44.00 ml  19.08 ml/m LA Vol (A4C):   162.0 ml 70.24 ml/m LA Biplane Vol: 161.0 ml 69.81 ml/m  AORTIC VALVE                    PULMONIC VALVE AV Area (Vmax):    3.10 cm     PV Vmax:       1.15 m/s AV Area (Vmean):   2.84 cm     PV Vmean:      83.400 cm/s AV Area (VTI):     3.02 cm     PV VTI:        0.330 m AV Vmax:           195.50 cm/s  PV Peak grad:  5.3 mmHg AV Vmean:          128.000 cm/s PV Mean grad:  3.0 mmHg AV VTI:            0.403 m AV Peak Grad:      15.3 mmHg AV Mean Grad:      8.0 mmHg LVOT Vmax:         146.00 cm/s LVOT Vmean:        87.500 cm/s LVOT VTI:          0.293 m LVOT/AV VTI ratio: 0.73 AI PHT:            497 msec AR Vena Contracta: 0.30 cm  AORTA Ao Root diam: 3.50 cm Ao Asc diam:  3.70 cm MITRAL VALVE                TRICUSPID VALVE MV Area (PHT): 3.74 cm     TR Peak grad:   41.2 mmHg MV Area VTI:   2.99 cm     TR Vmax:        321.00 cm/s MV Peak grad:  5.5 mmHg MV Mean grad:  2.0 mmHg     SHUNTS MV Vmax:       1.17 m/s     Systemic VTI:  0.29 m MV Vmean:      69.9 cm/s    Systemic Diam: 2.30 cm MV Decel Time: 203 msec MR Peak grad: 124.5 mmHg MR Mean grad: 80.0 mmHg MR Vmax:      558.00 cm/s MR Vmean:     418.0 cm/s MV E velocity: 108.00 cm/s MV A velocity: 73.30 cm/s MV E/A ratio:  1.47 Candee Furbish MD Electronically signed by Candee Furbish MD Signature Date/Time: 03/23/2021/12:18:47 PM    Final      Subjective: He is alert, denies dyspnea. Feels better.   Discharge Exam: Vitals:   03/24/21 0405 03/24/21 1206  BP: (!) 143/85 (!) 147/87  Pulse: 60 62  Resp: 18 18  Temp: 98.1 F (36.7 C) 97.7 F (36.5 C)  SpO2: 96% 96%     General: Pt  is alert, awake, not in acute distress Cardiovascular: RRR, S1/S2 +, no rubs, no gallops Respiratory: CTA bilaterally, no wheezing, no  rhonchi Abdominal: Soft, NT, ND, bowel sounds + Extremities: no edema, no cyanosis    The results of significant diagnostics from this hospitalization (including imaging, microbiology, ancillary and laboratory) are listed below for reference.     Microbiology: Recent Results (from the past 240 hour(s))  Resp Panel by RT-PCR (Flu A&B, Covid) Nasopharyngeal Swab     Status: None   Collection Time: 03/22/21  6:08 AM   Specimen: Nasopharyngeal Swab; Nasopharyngeal(NP) swabs in vial transport medium  Result Value Ref Range Status   SARS Coronavirus 2 by RT PCR NEGATIVE NEGATIVE Final    Comment: (NOTE) SARS-CoV-2 target nucleic acids are NOT DETECTED.  The SARS-CoV-2 RNA is generally detectable in upper respiratory specimens during the acute phase of infection. The lowest concentration of SARS-CoV-2 viral copies this assay can detect is 138 copies/mL. A negative result does not preclude SARS-Cov-2 infection and should not be used as the sole basis for treatment or other patient management decisions. A negative result may occur with  improper specimen collection/handling, submission of specimen other than nasopharyngeal swab, presence of viral mutation(s) within the areas targeted by this assay, and inadequate number of viral copies(<138 copies/mL). A negative result must be combined with clinical observations, patient history, and epidemiological information. The expected result is Negative.  Fact Sheet for Patients:  EntrepreneurPulse.com.au  Fact Sheet for Healthcare Providers:  IncredibleEmployment.be  This test is no t yet approved or cleared by the [place] FDA and  has been authorized for detection and/or diagnosis of SARS-CoV-2 by FDA under an Emergency Use Authorization (EUA). This EUA will remain  in effect (meaning this test can be used) for the duration of the COVID-19 declaration under Section 564(b)(1) of the Act,  21 U.S.C.section 360bbb-3(b)(1), unless the authorization is terminated  or revoked sooner.       Influenza A by PCR NEGATIVE NEGATIVE Final   Influenza B by PCR NEGATIVE NEGATIVE Final    Comment: (NOTE) The Xpert Xpress SARS-CoV-2/FLU/RSV plus assay is intended as an aid in the diagnosis of influenza from Nasopharyngeal swab specimens and should not be used as a sole basis for treatment. Nasal washings and aspirates are unacceptable for Xpert Xpress SARS-CoV-2/FLU/RSV testing.  Fact Sheet for Patients: EntrepreneurPulse.com.au  Fact Sheet for Healthcare Providers: IncredibleEmployment.be  This test is not yet approved or cleared by the [place] FDA and has been authorized for detection and/or diagnosis of SARS-CoV-2 by FDA under an Emergency Use Authorization (EUA). This EUA will remain in effect (meaning this test can be used) for the duration of the COVID-19 declaration under Section 564(b)(1) of the Act, 21 U.S.C. section 360bbb-3(b)(1), unless the authorization is terminated or revoked.  Performed at [facility], [place] [address], [place], [place] [address]      Labs: BNP (last 3 results) Recent Labs    02/13/21 0731 03/22/21 0608 03/24/21 0902  BNP 242.7* 3,939.4* 4,174.0*   Basic Metabolic Panel: Recent Labs  Lab 03/22/21 0608 03/23/21 0458 03/24/21 0438  NA 142 140 141  K 4.8 4.0 3.6  CL 108 107 110  CO2 27 27 25   GLUCOSE 117* 128* 120*  BUN 16 28* 24*  CREATININE 1.65* 1.88* 1.58*  CALCIUM 8.7* 8.8* 8.4*   Liver Function Tests: Recent Labs  Lab 03/22/21 0608  AST 33  ALT 35  ALKPHOS 58  BILITOT 0.7  PROT  6.3*  ALBUMIN 3.4*   No results for input(s): LIPASE, AMYLASE in the last 168 hours. No results for input(s): AMMONIA in the last 168 hours. CBC: Recent Labs  Lab 03/22/21 0608 03/23/21 0458  WBC 9.9 8.2  NEUTROABS 7.5  --   HGB 13.2 12.6*  HCT 39.8 37.9*  MCV 92.8 90.7  PLT 163 164    Cardiac Enzymes: No results for input(s): CKTOTAL, CKMB, CKMBINDEX, TROPONINI in the last 168 hours. BNP: Invalid input(s): POCBNP CBG: No results for input(s): GLUCAP in the last 168 hours. D-Dimer No results for input(s): DDIMER in the last 72 hours. Hgb A1c No results for input(s): HGBA1C in the last 72 hours. Lipid Profile No results for input(s): CHOL, HDL, LDLCALC, TRIG, CHOLHDL, LDLDIRECT in the last 72 hours. Thyroid function studies No results for input(s): TSH, T4TOTAL, T3FREE, THYROIDAB in the last 72 hours.  Invalid input(s): FREET3 Anemia work up No results for input(s): VITAMINB12, FOLATE, FERRITIN, TIBC, IRON, RETICCTPCT in the last 72 hours. Urinalysis    Component Value Date/Time   COLORURINE YELLOW 11/18/2020 2345   APPEARANCEUR HAZY (A) 11/18/2020 2345   LABSPEC 1.032 (H) 11/18/2020 2345   PHURINE 5.0 11/18/2020 2345   GLUCOSEU NEGATIVE 11/18/2020 2345   HGBUR MODERATE (A) 11/18/2020 2345   BILIRUBINUR NEGATIVE 11/18/2020 2345   KETONESUR NEGATIVE 11/18/2020 2345   PROTEINUR >=300 (A) 11/18/2020 2345   UROBILINOGEN 0.2 11/16/2012 1004   NITRITE NEGATIVE 11/18/2020 2345   LEUKOCYTESUR NEGATIVE 11/18/2020 2345   Sepsis Labs Invalid input(s): PROCALCITONIN,  WBC,  LACTICIDVEN Microbiology Recent Results (from the past 240 hour(s))  Resp Panel by RT-PCR (Flu A&B, Covid) Nasopharyngeal Swab     Status: None   Collection Time: 03/22/21  6:08 AM   Specimen: Nasopharyngeal Swab; Nasopharyngeal(NP) swabs in vial transport medium  Result Value Ref Range Status   SARS Coronavirus 2 by RT PCR NEGATIVE NEGATIVE Final    Comment: (NOTE) SARS-CoV-2 target nucleic acids are NOT DETECTED.  The SARS-CoV-2 RNA is generally detectable in upper respiratory specimens during the acute phase of infection. The lowest concentration of SARS-CoV-2 viral copies this assay can detect is 138 copies/mL. A negative result does not preclude SARS-Cov-2 infection and should not  be used as the sole basis for treatment or other patient management decisions. A negative result may occur with  improper specimen collection/handling, submission of specimen other than nasopharyngeal swab, presence of viral mutation(s) within the areas targeted by this assay, and inadequate number of viral copies(<138 copies/mL). A negative result must be combined with clinical observations, patient history, and epidemiological information. The expected result is Negative.  Fact Sheet for Patients:  EntrepreneurPulse.com.au  Fact Sheet for Healthcare Providers:  IncredibleEmployment.be  This test is no t yet approved or cleared by the [place] FDA and  has been authorized for detection and/or diagnosis of SARS-CoV-2 by FDA under an Emergency Use Authorization (EUA). This EUA will remain  in effect (meaning this test can be used) for the duration of the COVID-19 declaration under Section 564(b)(1) of the Act, 21 U.S.C.section 360bbb-3(b)(1), unless the authorization is terminated  or revoked sooner.       Influenza A by PCR NEGATIVE NEGATIVE Final   Influenza B by PCR NEGATIVE NEGATIVE Final    Comment: (NOTE) The Xpert Xpress SARS-CoV-2/FLU/RSV plus assay is intended as an aid in the diagnosis of influenza from Nasopharyngeal swab specimens and should not be used as a sole basis for treatment. Nasal washings and aspirates are unacceptable  for Xpert Xpress SARS-CoV-2/FLU/RSV testing.  Fact Sheet for Patients: EntrepreneurPulse.com.au  Fact Sheet for Healthcare Providers: IncredibleEmployment.be  This test is not yet approved or cleared by the [place] FDA and has been authorized for detection and/or diagnosis of SARS-CoV-2 by FDA under an Emergency Use Authorization (EUA). This EUA will remain in effect (meaning this test can be used) for the duration of the COVID-19 declaration under Section  564(b)(1) of the Act, 21 U.S.C. section 360bbb-3(b)(1), unless the authorization is terminated or revoked.  Performed at [facility], [place] [address], [place], [place] [address]      Time coordinating discharge: 40 minutes  SIGNED:   Elmarie Shiley, MD  [facility]

## 2021-03-25 ENCOUNTER — Encounter: Payer: Self-pay | Admitting: Neurology

## 2021-03-25 ENCOUNTER — Other Ambulatory Visit: Payer: Self-pay

## 2021-03-25 DIAGNOSIS — R0600 Dyspnea, unspecified: Secondary | ICD-10-CM

## 2021-03-25 DIAGNOSIS — I10 Essential (primary) hypertension: Secondary | ICD-10-CM

## 2021-03-25 DIAGNOSIS — R0609 Other forms of dyspnea: Secondary | ICD-10-CM

## 2021-03-25 DIAGNOSIS — I5031 Acute diastolic (congestive) heart failure: Secondary | ICD-10-CM

## 2021-03-25 NOTE — Telephone Encounter (Signed)
Please order BMP, NTprobBNP, and Mg prior to next office visit.  Please release the orders as well.

## 2021-03-30 ENCOUNTER — Other Ambulatory Visit: Payer: Self-pay

## 2021-03-30 DIAGNOSIS — I10 Essential (primary) hypertension: Secondary | ICD-10-CM | POA: Diagnosis not present

## 2021-03-30 DIAGNOSIS — I5031 Acute diastolic (congestive) heart failure: Secondary | ICD-10-CM | POA: Diagnosis not present

## 2021-03-30 DIAGNOSIS — R0600 Dyspnea, unspecified: Secondary | ICD-10-CM | POA: Diagnosis not present

## 2021-03-30 MED ORDER — ISOSORB DINITRATE-HYDRALAZINE 20-37.5 MG PO TABS: 1.0000 | ORAL_TABLET | Freq: Three times a day (TID) | ORAL | 3 refills | Status: DC

## 2021-03-31 ENCOUNTER — Other Ambulatory Visit: Payer: Self-pay

## 2021-03-31 DIAGNOSIS — I5031 Acute diastolic (congestive) heart failure: Secondary | ICD-10-CM

## 2021-03-31 LAB — PRO B NATRIURETIC PEPTIDE: NT-Pro BNP: 2643 pg/mL — ABNORMAL HIGH (ref 0–210)

## 2021-03-31 LAB — BASIC METABOLIC PANEL
BUN/Creatinine Ratio: 10 (ref 10–24)
BUN: 19 mg/dL (ref 8–27)
CO2: 24 mmol/L (ref 20–29)
Calcium: 8.6 mg/dL (ref 8.6–10.2)
Chloride: 109 mmol/L — ABNORMAL HIGH (ref 96–106)
Creatinine, Ser: 1.84 mg/dL — ABNORMAL HIGH (ref 0.76–1.27)
Glucose: 106 mg/dL — ABNORMAL HIGH (ref 65–99)
Potassium: 3.8 mmol/L (ref 3.5–5.2)
Sodium: 145 mmol/L — ABNORMAL HIGH (ref 134–144)
eGFR: 40 mL/min/{1.73_m2} — ABNORMAL LOW (ref >59)

## 2021-03-31 LAB — MAGNESIUM: Magnesium: 2.3 mg/dL (ref 1.6–2.3)

## 2021-04-01 ENCOUNTER — Emergency Department (HOSPITAL_COMMUNITY)
Admission: EM | Admit: 2021-04-01 | Discharge: 2021-04-02 | Disposition: A | Discharge status: Home / Self Care | Payer: PPO | Attending: Emergency Medicine | Admitting: Emergency Medicine

## 2021-04-01 ENCOUNTER — Other Ambulatory Visit: Payer: Self-pay

## 2021-04-01 ENCOUNTER — Emergency Department (HOSPITAL_COMMUNITY): Payer: PPO

## 2021-04-01 DIAGNOSIS — R0602 Shortness of breath: Secondary | ICD-10-CM | POA: Insufficient documentation

## 2021-04-01 DIAGNOSIS — I509 Heart failure, unspecified: Secondary | ICD-10-CM | POA: Diagnosis not present

## 2021-04-01 DIAGNOSIS — Z20822 Contact with and (suspected) exposure to covid-19: Secondary | ICD-10-CM | POA: Insufficient documentation

## 2021-04-01 DIAGNOSIS — J90 Pleural effusion, not elsewhere classified: Secondary | ICD-10-CM | POA: Diagnosis not present

## 2021-04-01 DIAGNOSIS — Z5321 Procedure and treatment not carried out due to patient leaving prior to being seen by health care provider: Secondary | ICD-10-CM | POA: Diagnosis not present

## 2021-04-01 LAB — CBC WITH DIFFERENTIAL/PLATELET
Abs Immature Granulocytes: 0.04 10*3/uL (ref 0.00–0.07)
Basophils Absolute: 0 10*3/uL (ref 0.0–0.1)
Basophils Relative: 0 %
Eosinophils Absolute: 0.2 10*3/uL (ref 0.0–0.5)
Eosinophils Relative: 2 %
HCT: 34.6 % — ABNORMAL LOW (ref 39.0–52.0)
Hemoglobin: 11.2 g/dL — ABNORMAL LOW (ref 13.0–17.0)
Immature Granulocytes: 0 %
Lymphocytes Relative: 12 %
Lymphs Abs: 1 10*3/uL (ref 0.7–4.0)
MCH: 30.4 pg (ref 26.0–34.0)
MCHC: 32.4 g/dL (ref 30.0–36.0)
MCV: 93.8 fL (ref 80.0–100.0)
Monocytes Absolute: 0.8 10*3/uL (ref 0.1–1.0)
Monocytes Relative: 9 %
Neutro Abs: 6.9 10*3/uL (ref 1.7–7.7)
Neutrophils Relative %: 77 %
Platelets: 187 10*3/uL (ref 150–400)
RBC: 3.69 MIL/uL — ABNORMAL LOW (ref 4.22–5.81)
RDW: 19 % — ABNORMAL HIGH (ref 11.5–15.5)
WBC: 9 10*3/uL (ref 4.0–10.5)
nRBC: 0 % (ref 0.0–0.2)

## 2021-04-01 NOTE — ED Provider Notes (Signed)
MSE was initiated and I personally evaluated the patient and placed orders (if any) at  10:53 PM on April 01, 2021.  Patient to ED with SOB that started today. He reports admission last week for CHF and symptoms tonight feel the same. No fever, cough, chest pain or LE edema. He reports being compliant with medications.   Today's Vitals   04/01/21 2233 04/01/21 2252  BP: (!) 171/99   Pulse: 73   Resp: 20   Temp: 98.7 F (37.1 C)   TempSrc: Oral   SpO2: 91% 97%   There is no height or weight on file to calculate BMI.  Lungs clear RRR No LE edema  The patient appears stable so that the remainder of the MSE may be completed by another provider.   Charlann Lange, PA-C 04/01/21 2254    Margette Fast, MD 04/06/21 1248

## 2021-04-01 NOTE — ED Triage Notes (Signed)
Shortness of breath all day, denies cough or fevers. No pain.

## 2021-04-02 ENCOUNTER — Telehealth: Payer: Self-pay | Admitting: Cardiology

## 2021-04-02 LAB — COMPREHENSIVE METABOLIC PANEL
ALT: 39 U/L (ref 0–44)
AST: 36 U/L (ref 15–41)
Albumin: 3.1 g/dL — ABNORMAL LOW (ref 3.5–5.0)
Alkaline Phosphatase: 66 U/L (ref 38–126)
Anion gap: 4 — ABNORMAL LOW (ref 5–15)
BUN: 15 mg/dL (ref 8–23)
CO2: 25 mmol/L (ref 22–32)
Calcium: 8.1 mg/dL — ABNORMAL LOW (ref 8.9–10.3)
Chloride: 105 mmol/L (ref 98–111)
Creatinine, Ser: 1.73 mg/dL — ABNORMAL HIGH (ref 0.61–1.24)
GFR, Estimated: 44 mL/min — ABNORMAL LOW (ref >60)
Glucose, Bld: 99 mg/dL (ref 70–99)
Potassium: 3.3 mmol/L — ABNORMAL LOW (ref 3.5–5.1)
Sodium: 134 mmol/L — ABNORMAL LOW (ref 135–145)
Total Bilirubin: 0.7 mg/dL (ref 0.3–1.2)
Total Protein: 5.6 g/dL — ABNORMAL LOW (ref 6.5–8.1)

## 2021-04-02 LAB — TROPONIN I (HIGH SENSITIVITY)
Troponin I (High Sensitivity): 15 ng/L (ref <18)
Troponin I (High Sensitivity): 15 ng/L (ref <18)

## 2021-04-02 LAB — BRAIN NATRIURETIC PEPTIDE: B Natriuretic Peptide: 2627.5 pg/mL — ABNORMAL HIGH (ref 0.0–100.0)

## 2021-04-02 LAB — URINALYSIS, ROUTINE W REFLEX MICROSCOPIC
Bacteria, UA: NONE SEEN
Bilirubin Urine: NEGATIVE
Glucose, UA: 150 mg/dL — AB
Ketones, ur: NEGATIVE mg/dL
Leukocytes,Ua: NEGATIVE
Nitrite: NEGATIVE
Protein, ur: >=300 mg/dL — AB
Specific Gravity, Urine: 1.013 (ref 1.005–1.030)
pH: 6 (ref 5.0–8.0)

## 2021-04-02 LAB — SARS CORONAVIRUS 2 (TAT 6-24 HRS): SARS Coronavirus 2: NEGATIVE

## 2021-04-02 NOTE — ED Notes (Signed)
Pt stating he wants to leave AMA, convinced to stay at this time

## 2021-04-02 NOTE — Telephone Encounter (Signed)
PT recently discharged from hospital, is having shortness of breath per PT & would like to discuss recent symptoms/hospital stay w/CMA. PT has upcoming appt w/Dr. Terri Skains 8/5. [facility]

## 2021-04-02 NOTE — ED Notes (Signed)
Pt left AMA °

## 2021-04-06 ENCOUNTER — Ambulatory Visit: Payer: PPO | Admitting: Cardiology

## 2021-04-06 DIAGNOSIS — I5031 Acute diastolic (congestive) heart failure: Secondary | ICD-10-CM | POA: Diagnosis not present

## 2021-04-07 ENCOUNTER — Telehealth: Payer: Self-pay

## 2021-04-07 ENCOUNTER — Encounter (HOSPITAL_COMMUNITY): Payer: Self-pay | Admitting: Emergency Medicine

## 2021-04-07 ENCOUNTER — Inpatient Hospital Stay (HOSPITAL_COMMUNITY)
Admission: EM | Admit: 2021-04-07 | Discharge: 2021-04-09 | DRG: 291 | Disposition: A | Discharge status: Home / Self Care | Payer: PPO | Attending: Internal Medicine | Admitting: Internal Medicine

## 2021-04-07 ENCOUNTER — Emergency Department (HOSPITAL_COMMUNITY): Payer: PPO

## 2021-04-07 ENCOUNTER — Other Ambulatory Visit: Payer: Self-pay

## 2021-04-07 DIAGNOSIS — I161 Hypertensive emergency: Secondary | ICD-10-CM | POA: Diagnosis not present

## 2021-04-07 DIAGNOSIS — Z7982 Long term (current) use of aspirin: Secondary | ICD-10-CM | POA: Diagnosis not present

## 2021-04-07 DIAGNOSIS — Z9119 Patient's noncompliance with other medical treatment and regimen: Secondary | ICD-10-CM

## 2021-04-07 DIAGNOSIS — Z801 Family history of malignant neoplasm of trachea, bronchus and lung: Secondary | ICD-10-CM

## 2021-04-07 DIAGNOSIS — Z823 Family history of stroke: Secondary | ICD-10-CM | POA: Diagnosis not present

## 2021-04-07 DIAGNOSIS — Z885 Allergy status to narcotic agent status: Secondary | ICD-10-CM

## 2021-04-07 DIAGNOSIS — E785 Hyperlipidemia, unspecified: Secondary | ICD-10-CM | POA: Diagnosis not present

## 2021-04-07 DIAGNOSIS — Z86711 Personal history of pulmonary embolism: Secondary | ICD-10-CM | POA: Diagnosis not present

## 2021-04-07 DIAGNOSIS — D869 Sarcoidosis, unspecified: Secondary | ICD-10-CM | POA: Diagnosis not present

## 2021-04-07 DIAGNOSIS — M7989 Other specified soft tissue disorders: Secondary | ICD-10-CM | POA: Diagnosis not present

## 2021-04-07 DIAGNOSIS — G4733 Obstructive sleep apnea (adult) (pediatric): Secondary | ICD-10-CM | POA: Diagnosis present

## 2021-04-07 DIAGNOSIS — I10 Essential (primary) hypertension: Secondary | ICD-10-CM | POA: Diagnosis present

## 2021-04-07 DIAGNOSIS — I2510 Atherosclerotic heart disease of native coronary artery without angina pectoris: Secondary | ICD-10-CM | POA: Diagnosis present

## 2021-04-07 DIAGNOSIS — Z820 Family history of epilepsy and other diseases of the nervous system: Secondary | ICD-10-CM

## 2021-04-07 DIAGNOSIS — J441 Chronic obstructive pulmonary disease with (acute) exacerbation: Secondary | ICD-10-CM | POA: Diagnosis not present

## 2021-04-07 DIAGNOSIS — Z9049 Acquired absence of other specified parts of digestive tract: Secondary | ICD-10-CM

## 2021-04-07 DIAGNOSIS — I130 Hypertensive heart and chronic kidney disease with heart failure and stage 1 through stage 4 chronic kidney disease, or unspecified chronic kidney disease: Secondary | ICD-10-CM | POA: Diagnosis present

## 2021-04-07 DIAGNOSIS — R0602 Shortness of breath: Secondary | ICD-10-CM | POA: Diagnosis not present

## 2021-04-07 DIAGNOSIS — Z8673 Personal history of transient ischemic attack (TIA), and cerebral infarction without residual deficits: Secondary | ICD-10-CM

## 2021-04-07 DIAGNOSIS — I5033 Acute on chronic diastolic (congestive) heart failure: Principal | ICD-10-CM | POA: Diagnosis present

## 2021-04-07 DIAGNOSIS — J90 Pleural effusion, not elsewhere classified: Secondary | ICD-10-CM | POA: Diagnosis not present

## 2021-04-07 DIAGNOSIS — J9601 Acute respiratory failure with hypoxia: Secondary | ICD-10-CM | POA: Diagnosis present

## 2021-04-07 DIAGNOSIS — N1832 Chronic kidney disease, stage 3b: Secondary | ICD-10-CM | POA: Diagnosis present

## 2021-04-07 DIAGNOSIS — I252 Old myocardial infarction: Secondary | ICD-10-CM | POA: Diagnosis not present

## 2021-04-07 DIAGNOSIS — Z91199 Patient's noncompliance with other medical treatment and regimen due to unspecified reason: Secondary | ICD-10-CM

## 2021-04-07 DIAGNOSIS — Z79899 Other long term (current) drug therapy: Secondary | ICD-10-CM

## 2021-04-07 DIAGNOSIS — R5383 Other fatigue: Secondary | ICD-10-CM | POA: Diagnosis not present

## 2021-04-07 DIAGNOSIS — J811 Chronic pulmonary edema: Secondary | ICD-10-CM | POA: Diagnosis not present

## 2021-04-07 DIAGNOSIS — Z20822 Contact with and (suspected) exposure to covid-19: Secondary | ICD-10-CM | POA: Diagnosis not present

## 2021-04-07 DIAGNOSIS — Z8249 Family history of ischemic heart disease and other diseases of the circulatory system: Secondary | ICD-10-CM | POA: Diagnosis not present

## 2021-04-07 DIAGNOSIS — I517 Cardiomegaly: Secondary | ICD-10-CM | POA: Diagnosis not present

## 2021-04-07 DIAGNOSIS — Z87891 Personal history of nicotine dependence: Secondary | ICD-10-CM

## 2021-04-07 LAB — BASIC METABOLIC PANEL
Anion gap: 6 (ref 5–15)
BUN/Creatinine Ratio: 10 (ref 10–24)
BUN: 16 mg/dL (ref 8–27)
BUN: 15 mg/dL (ref 8–23)
CO2: 25 mmol/L (ref 20–29)
CO2: 28 mmol/L (ref 22–32)
Calcium: 8.7 mg/dL (ref 8.6–10.2)
Calcium: 8.9 mg/dL (ref 8.9–10.3)
Chloride: 109 mmol/L — ABNORMAL HIGH (ref 96–106)
Chloride: 106 mmol/L (ref 98–111)
Creatinine, Ser: 1.53 mg/dL — ABNORMAL HIGH (ref 0.76–1.27)
Creatinine, Ser: 1.63 mg/dL — ABNORMAL HIGH (ref 0.61–1.24)
GFR, Estimated: 47 mL/min — ABNORMAL LOW (ref >60)
Glucose, Bld: 145 mg/dL — ABNORMAL HIGH (ref 70–99)
Glucose: 124 mg/dL — ABNORMAL HIGH (ref 65–99)
Potassium: 3.6 mmol/L (ref 3.5–5.2)
Potassium: 3.6 mmol/L (ref 3.5–5.1)
Sodium: 146 mmol/L — ABNORMAL HIGH (ref 134–144)
Sodium: 140 mmol/L (ref 135–145)
eGFR: 50 mL/min/{1.73_m2} — ABNORMAL LOW (ref >59)

## 2021-04-07 LAB — TROPONIN I (HIGH SENSITIVITY)
Troponin I (High Sensitivity): 17 ng/L (ref <18)
Troponin I (High Sensitivity): 13 ng/L (ref <18)

## 2021-04-07 LAB — RESP PANEL BY RT-PCR (FLU A&B, COVID) ARPGX2
Influenza A by PCR: NEGATIVE
Influenza B by PCR: NEGATIVE
SARS Coronavirus 2 by RT PCR: NEGATIVE

## 2021-04-07 LAB — CBC WITH DIFFERENTIAL/PLATELET
Abs Immature Granulocytes: 0.05 10*3/uL (ref 0.00–0.07)
Basophils Absolute: 0 10*3/uL (ref 0.0–0.1)
Basophils Relative: 0 %
Eosinophils Absolute: 0.1 10*3/uL (ref 0.0–0.5)
Eosinophils Relative: 1 %
HCT: 34.7 % — ABNORMAL LOW (ref 39.0–52.0)
Hemoglobin: 11.6 g/dL — ABNORMAL LOW (ref 13.0–17.0)
Immature Granulocytes: 1 %
Lymphocytes Relative: 5 %
Lymphs Abs: 0.5 10*3/uL — ABNORMAL LOW (ref 0.7–4.0)
MCH: 31.2 pg (ref 26.0–34.0)
MCHC: 33.4 g/dL (ref 30.0–36.0)
MCV: 93.3 fL (ref 80.0–100.0)
Monocytes Absolute: 0.6 10*3/uL (ref 0.1–1.0)
Monocytes Relative: 6 %
Neutro Abs: 8.8 10*3/uL — ABNORMAL HIGH (ref 1.7–7.7)
Neutrophils Relative %: 87 %
Platelets: 195 10*3/uL (ref 150–400)
RBC: 3.72 MIL/uL — ABNORMAL LOW (ref 4.22–5.81)
RDW: 17.6 % — ABNORMAL HIGH (ref 11.5–15.5)
WBC: 10 10*3/uL (ref 4.0–10.5)
nRBC: 0 % (ref 0.0–0.2)

## 2021-04-07 LAB — PRO B NATRIURETIC PEPTIDE: NT-Pro BNP: 5653 pg/mL — ABNORMAL HIGH (ref 0–210)

## 2021-04-07 LAB — MAGNESIUM: Magnesium: 2 mg/dL (ref 1.6–2.3)

## 2021-04-07 LAB — BRAIN NATRIURETIC PEPTIDE: B Natriuretic Peptide: 3953.6 pg/mL — ABNORMAL HIGH (ref 0.0–100.0)

## 2021-04-07 MED ORDER — ASPIRIN EC 81 MG PO TBEC
80.0000 mg | DELAYED_RELEASE_TABLET | Freq: Every day | ORAL | Status: DC
  Administered 2021-04-07 – 2021-04-09 (×3): 81 mg via ORAL
  Filled 2021-04-07 (×3): qty 1

## 2021-04-07 MED ORDER — BUMETANIDE 0.25 MG/ML IJ SOLN
1.0000 mg | Freq: Two times a day (BID) | INTRAMUSCULAR | Status: DC
  Administered 2021-04-07 – 2021-04-08 (×3): 1 mg via INTRAVENOUS
  Filled 2021-04-07 (×4): qty 4

## 2021-04-07 MED ORDER — TAMSULOSIN HCL 0.4 MG PO CAPS
0.4000 mg | ORAL_CAPSULE | Freq: Every day | ORAL | Status: DC
  Administered 2021-04-07 – 2021-04-09 (×3): 0.4 mg via ORAL
  Filled 2021-04-07 (×3): qty 1

## 2021-04-07 MED ORDER — DILTIAZEM HCL ER COATED BEADS 180 MG PO CP24
360.0000 mg | ORAL_CAPSULE | Freq: Every day | ORAL | Status: DC
  Administered 2021-04-07 – 2021-04-09 (×3): 360 mg via ORAL
  Filled 2021-04-07: qty 1
  Filled 2021-04-07 (×2): qty 2
  Filled 2021-04-07: qty 3

## 2021-04-07 MED ORDER — PREDNISONE 10 MG PO TABS
10.0000 mg | ORAL_TABLET | Freq: Every day | ORAL | Status: DC
  Administered 2021-04-08 – 2021-04-09 (×2): 10 mg via ORAL
  Filled 2021-04-07 (×3): qty 1

## 2021-04-07 MED ORDER — HEPARIN SODIUM (PORCINE) 5000 UNIT/ML IJ SOLN
5000.0000 [IU] | Freq: Three times a day (TID) | INTRAMUSCULAR | Status: DC
  Administered 2021-04-07 – 2021-04-09 (×4): 5000 [IU] via SUBCUTANEOUS
  Filled 2021-04-07 (×5): qty 1

## 2021-04-07 MED ORDER — FLUTICASONE PROPIONATE 50 MCG/ACT NA SUSP: 2.0000 | Freq: Every day | NASAL | Status: DC | PRN

## 2021-04-07 MED ORDER — LABETALOL HCL 200 MG PO TABS
300.0000 mg | ORAL_TABLET | Freq: Three times a day (TID) | ORAL | Status: DC
  Administered 2021-04-07 – 2021-04-09 (×6): 300 mg via ORAL
  Filled 2021-04-07 (×5): qty 1
  Filled 2021-04-07: qty 2

## 2021-04-07 MED ORDER — ACETAMINOPHEN 325 MG PO TABS
650.0000 mg | ORAL_TABLET | Freq: Four times a day (QID) | ORAL | Status: DC | PRN
  Administered 2021-04-08: 650 mg via ORAL
  Filled 2021-04-07: qty 2

## 2021-04-07 MED ORDER — ISOSORB DINITRATE-HYDRALAZINE 20-37.5 MG PO TABS
1.0000 | ORAL_TABLET | Freq: Three times a day (TID) | ORAL | Status: DC
  Administered 2021-04-07 – 2021-04-09 (×5): 1 via ORAL
  Filled 2021-04-07 (×7): qty 1

## 2021-04-07 MED ORDER — ATORVASTATIN CALCIUM 10 MG PO TABS
20.0000 mg | ORAL_TABLET | Freq: Every day | ORAL | Status: DC
  Administered 2021-04-07 – 2021-04-09 (×3): 20 mg via ORAL
  Filled 2021-04-07 (×3): qty 2

## 2021-04-07 MED ORDER — ONDANSETRON HCL 4 MG/2ML IJ SOLN: 4.0000 mg | Freq: Four times a day (QID) | INTRAMUSCULAR | Status: DC | PRN

## 2021-04-07 MED ORDER — METHYLPREDNISOLONE SODIUM SUCC 40 MG IJ SOLR
40.0000 mg | Freq: Once | INTRAMUSCULAR | Status: AC
  Administered 2021-04-07: 40 mg via INTRAVENOUS
  Filled 2021-04-07: qty 1

## 2021-04-07 MED ORDER — ONDANSETRON HCL 4 MG PO TABS: 4.0000 mg | ORAL_TABLET | Freq: Four times a day (QID) | ORAL | Status: DC | PRN

## 2021-04-07 MED ORDER — ACETAMINOPHEN 650 MG RE SUPP: 650.0000 mg | Freq: Four times a day (QID) | RECTAL | Status: DC | PRN

## 2021-04-07 MED ORDER — TRAZODONE HCL 100 MG PO TABS
150.0000 mg | ORAL_TABLET | Freq: Every evening | ORAL | Status: DC | PRN
  Administered 2021-04-07 – 2021-04-08 (×2): 150 mg via ORAL
  Filled 2021-04-07 (×2): qty 1

## 2021-04-07 MED ORDER — FUROSEMIDE 10 MG/ML IJ SOLN
40.0000 mg | Freq: Once | INTRAMUSCULAR | Status: DC
Start: 2021-04-07 — End: 2021-04-07

## 2021-04-07 NOTE — ED Triage Notes (Addendum)
Pt here with increased shortness of breath, extremity swelling, and fatigue. Breathing is labored with use of accessory muscles. Saturations range from 87-89 on room air. Placed on 2l [place]. Denies chest pain.

## 2021-04-07 NOTE — ED Provider Notes (Signed)
[facility] EMERGENCY DEPARTMENT Provider Note   CSN: [id number] Arrival date & time: 04/07/21  1236     History Chief Complaint  Patient presents with   Shortness of Breath   Leg Swelling   Fatigue    [name] is a 65 y.o. male.  With PMH CKD 3, previous CVA, CHF with known diastolic dysfunction, HLD, previous PE, sarcoidosis, recent hospital admission for acute on chronic CHF exacerbation who presents the emergency department with shortness of breath.  Patient states that for the last 3 days he has had progressively worsening shortness of breath, lower extremity swelling and orthopnea.  He states it feels similar to his previous hospital admission.  He states he is currently on home BiDil but is not taking any home loop diuretics.  Denies chest pain, abdominal pain, nausea, vomiting, fevers or other systemic symptoms.   Shortness of Breath Associated symptoms: no abdominal pain, no chest pain, no cough, no ear pain, no fever, no rash, no sore throat and no vomiting       Past Medical History:  Diagnosis Date   Back pain    CKD (chronic kidney disease), stage III ([place])    CVA (cerebral infarction)    Diastolic dysfunction    ED (erectile dysfunction)    GERD (gastroesophageal reflux disease)    Hypercalcemia    Hyperlipidemia    Hypertension    Insomnia    Long-term use of high-risk medication    Nephrocalcinosis    Nephrolithiasis    Obesity    Osteopenia    Pancreatitis    PE (pulmonary embolism)    Proteinuria    Sarcoidosis    Smoker     Patient Active Problem List   Diagnosis Date Noted   Acute on chronic diastolic CHF (congestive heart failure) ([place]) 03/22/2021   Encephalopathy acute 12/22/2020   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   Chronic intermittent hypoxia with obstructive sleep apnea 12/22/2020   Mild cognitive impairment 12/22/2020   Acute combined systolic and diastolic congestive heart failure  ([place]) 12/22/2020   Rotator cuff arthropathy, right 12/04/2020   Volume overload 12/04/2020   Shortness of breath 12/04/2020   ARF (acute renal failure) ([place]) 11/19/2020   Acute respiratory failure ([place]) 11/19/2020   Bacteremia due to methicillin susceptible Staphylococcus aureus (MSSA) 11/19/2020   Cranial neuropathy    Acute encephalopathy 11/18/2020   Chronic right shoulder pain 06/18/2020   OSA (obstructive sleep apnea) 05/05/2020   Hypersomnia with sleep apnea 12/20/2019   Psychophysiological insomnia 09/18/2019   Nocturia more than twice per night 09/18/2019   Renal hypertension 09/18/2019   Moderate asthma 09/18/2019   Tobacco abuse 09/18/2019   Non-restorative sleep 09/18/2019   Nontraumatic complete tear of right rotator cuff 08/15/2019   Delirium 03/16/2019   Stage 3b chronic kidney disease    CVA (cerebral infarction)    Diastolic dysfunction    Pseudarthrosis 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 02/28/2019   Intervertebral disc disorder with radiculopathy of lumbar region 09/13/2017   Acute gouty arthritis 03/07/2017   Arthralgia of left foot 03/07/2017   Nephrolithiasis    Fever, unspecified 11/02/2015   Essential hypertension 02/07/2015   Pulmonary embolism ([place]) 01/30/2013   Sarcoidosis 01/30/2013   Hemoptysis 01/30/2013   SHOULDER PAIN, LEFT 11/28/2009    Past Surgical History:  Procedure Laterality Date   ABDOMINAL EXPLORATION SURGERY     BACK SURGERY     CHOLECYSTECTOMY     SHOULDER ARTHROSCOPY Right 10/30/2020  Procedure: ARTHROSCOPY SHOULDER WITH EXTENSIVE DEBRIDEMENT;  Surgeon: Mcarthur Rossetti, MD;  Location: [place];  Service: Orthopedics;  Laterality: Right;   SHOULDER SURGERY     TRACHEOSTOMY     closed       Family History  Problem Relation Age of Onset   Hypertension Father    CVA Father    Lung cancer Father    Alzheimer's disease Mother    Hypertension Sister    Heart failure Sister     Social History   Tobacco  Use   Smoking status: Former    Packs/day: 0.25    Years: 15.00    Pack years: 3.75    Types: Cigarettes    Start date: 11/27/2020    Quit date: 12/30/2020    Years since quitting: 0.2   Smokeless tobacco: Never  Vaping Use   Vaping Use: Never used  Substance Use Topics   Alcohol use: No    Alcohol/week: 0.0 standard drinks   Drug use: No    Home Medications Prior to Admission medications   Medication Sig Start Date End Date Taking? Authorizing Provider  aspirin 81 MG tablet Take 1 tablet (81 mg total) by mouth daily. 02/01/13   [name], MD  atorvastatin (LIPITOR) 20 MG tablet Take 20 mg by mouth daily. 02/02/19   [provider]  BLACK ELDERBERRY PO Take 2 each by mouth daily as needed (immune support). Gummies    [provider]  Chlorphen-Phenyleph-ASA (ALKA-SELTZER PLUS COLD PO) Take 2 tablets by mouth every 6 (six) hours as needed (cold symptoms).    [provider]  diltiazem (CARDIZEM CD) 360 MG 24 hr capsule Take 360 mg by mouth daily.    [provider]  diphenhydrAMINE (BENADRYL) 25 mg capsule Take 25 mg by mouth every 6 (six) hours as needed.    [provider]  docusate sodium (COLACE) 100 MG capsule Take 1 capsule (100 mg total) by mouth 2 (two) times daily. 03/24/21   Regalado, Belkys A, MD  empagliflozin (JARDIANCE) 10 MG TABS tablet Take 1 tablet (10 mg total) by mouth daily. Patient taking differently: Take 10 mg by mouth daily. Hold Patient is not taking. 03/25/21   Regalado, Belkys A, MD  fluticasone (FLONASE) 50 MCG/ACT nasal spray Place 2 sprays into both nostrils daily as needed for allergies or rhinitis.    [provider]  isosorbide-hydrALAZINE (BIDIL) 20-37.5 MG tablet Take 1 tablet by mouth 3 (three) times daily. 03/30/21   Tolia, Sunit, DO  labetalol (NORMODYNE) 300 MG tablet Take 300 mg by mouth 3 (three) times daily.    [provider]  predniSONE (DELTASONE) 5 MG tablet Take 5 mg by mouth  daily. Reported on 11/02/2015    [provider]  sacubitril-valsartan (ENTRESTO) 49-51 MG Take 1 tablet by mouth 2 (two) times daily.    [provider]  tamsulosin (FLOMAX) 0.4 MG CAPS capsule Take 0.4 mg by mouth daily. 11/01/18   [provider]  traZODone (DESYREL) 150 MG tablet Take 1 tablet (150 mg total) by mouth at bedtime as needed for sleep. 12/22/20   Dohmeier, Asencion Partridge, MD    Allergies    Codeine and Hydromorphone  Review of Systems   Review of Systems  Constitutional:  Negative for chills and fever.  HENT:  Negative for ear pain and sore throat.   Eyes:  Negative for pain and visual disturbance.  Respiratory:  Positive for shortness of breath. Negative for cough.  Cardiovascular:  Positive for leg swelling. Negative for chest pain and palpitations.  Gastrointestinal:  Negative for abdominal pain and vomiting.  Genitourinary:  Negative for dysuria and hematuria.  Musculoskeletal:  Negative for arthralgias and back pain.  Skin:  Negative for color change and rash.  Neurological:  Negative for seizures and syncope.  All other systems reviewed and are negative.  Physical Exam Updated Vital Signs BP (!) 146/90 (BP Location: Right Arm)   Pulse 69   Temp 99 F (37.2 C) (Oral)   Resp 20   SpO2 93%   Physical Exam Vitals and nursing note reviewed.  Constitutional:      Appearance: He is well-developed.  HENT:     Head: Normocephalic and atraumatic.  Eyes:     Conjunctiva/sclera: Conjunctivae normal.  Cardiovascular:     Rate and Rhythm: Normal rate and regular rhythm.     Heart sounds: No murmur heard. Pulmonary:     Effort: Pulmonary effort is normal. No respiratory distress.     Breath sounds: Examination of the right-lower field reveals rales. Examination of the left-lower field reveals rales. Rales present.  Abdominal:     Palpations: Abdomen is soft.     Tenderness: There is no abdominal tenderness.  Musculoskeletal:     Cervical  back: Neck supple.     Right lower leg: Edema present.     Left lower leg: Edema present.  Skin:    General: Skin is warm and dry.  Neurological:     Mental Status: He is alert.    ED Results / Procedures / Treatments   Labs (all labs ordered are listed, but only abnormal results are displayed) Labs Reviewed  RESP PANEL BY RT-PCR (FLU A&B, COVID) ARPGX2  CBC WITH DIFFERENTIAL/PLATELET  BASIC METABOLIC PANEL  BRAIN NATRIURETIC PEPTIDE  TROPONIN I (HIGH SENSITIVITY)    EKG None  Radiology DG Chest 2 View  Result Date: 04/07/2021 CLINICAL DATA:  Shortness of breath.  Leg swelling.  Fatigue. EXAM: CHEST - 2 VIEW COMPARISON:  04/01/2021. FINDINGS: Cardiomegaly venous congestion and bilateral interstitial prominence again noted. Right base and right upper lobe alveolar infiltrates. Progressive right pleural effusion. No pneumothorax. Degenerative changes scoliosis thoracic spine. IMPRESSION: 1. Cardiomegaly with pulmonary venous congestion, bilateral interstitial prominence, and progressive right pleural effusion. Findings consistent with CHF. 2. Right base and right upper lobe alveolar infiltrates. This could represent asymmetric pulmonary edema, pneumonia cannot be excluded. Electronically Signed   By: Marcello Moores  Register   On: 04/07/2021 13:31    Procedures Procedures   Medications Ordered in ED Medications  furosemide (LASIX) injection 40 mg (has no administration in time range)    ED Course  I have reviewed the triage vital signs and the nursing notes.  Pertinent labs & imaging results that were available during my care of the patient were reviewed by me and considered in my medical decision making (see chart for details).    MDM Rules/Calculators/A&P                           Patient seen the emergency department for evaluation of shortness of breath and orthopnea.  Patient arrives hypoxic to 89% on room air.  Placed on 2 L nasal cannula.  Physical exam reveals mild 1+  pitting edema to bilateral lower extremities and mild rales in bilateral lower lobes on pulmonary exam.  Chest x-ray reveals interstitial pulmonary edema.  Laboratory evaluation with a creatinine elevation to 1.63, hemoglobin  11.6, BNP elevated to 3953, normal high-sensitivity troponin.  Delta troponin pending.  Patient given 40 mg IV Lasix to begin diuresis and the patient will require admission for diuresis in the setting of acute on chronic CHF. Final Clinical Impression(s) / ED Diagnoses Final diagnoses:  None    Rx / DC Orders ED Discharge Orders     None        Zadyn Yardley, MD 04/07/21 1510

## 2021-04-07 NOTE — ED Notes (Signed)
Attempted report x 2 

## 2021-04-07 NOTE — ED Notes (Signed)
Attempted report x1. 

## 2021-04-07 NOTE — Plan of Care (Signed)
  Problem: Education: Goal: Ability to demonstrate management of disease process will improve Outcome: Progressing Goal: Ability to verbalize understanding of medication therapies will improve Outcome: Progressing Goal: Individualized Educational Video(s) Outcome: Progressing   Problem: Cardiac: Goal: Ability to achieve and maintain adequate cardiopulmonary perfusion will improve Outcome: Progressing   Problem: Clinical Measurements: Goal: Respiratory complications will improve Outcome: Progressing   Problem: Clinical Measurements: Goal: Cardiovascular complication will be avoided Outcome: Progressing

## 2021-04-07 NOTE — H&P (Signed)
[facility] History and Physical   [name] [id number]   PCP: [name], MD DOB: [birth year]   DOA: 04/07/2021   DOS: 04/07/2021   DOS: the patient was seen and examined on 04/07/2021  Patient coming from: The patient is coming from Home  Chief Complaint: Shortness of breath  HPI: [name] is a 65 y.o. male with Past medical history of CKD 3B, CVA, chronic diastolic CHF, HLD, HTN, sarcoidosis, obesity.  Presents with complaints of progressively worsening shortness of breath. Patient was recently hospitalized between 7/17-7/19 for acute on chronic CHF and was treated with IV Bumex.  On discharge patient was not on any diuretics. For last 2 days patient started having progressive worsening of shortness of breath associated with cough and orthopnea. Also reports swelling of the leg. Tells that he is checking his weight and it has been ranging between 230 and 233 pounds. Denies any chest pain or chest tightness.  No nausea no vomiting.  No fever no chills.  Remains compliant with all his medication.  No dizziness lightheadedness. Any diarrhea constipation.  No bleeding anywhere. Was also seen in ER on 7/27 for similar presentation.  ED Course: 87% on room air.  Placed on 2 L of nasal cannula.  Chest x-ray shows congestion.  Patient was recommended for admission.  Review of Systems: as mentioned in the history of present illness.  All other systems reviewed and are negative.  Past Medical History:  Diagnosis Date   Back pain    CKD (chronic kidney disease), stage III ([facility])    CVA (cerebral infarction)    Diastolic dysfunction    ED (erectile dysfunction)    GERD (gastroesophageal reflux disease)    Hypercalcemia    Hyperlipidemia    Hypertension    Insomnia    Long-term use of high-risk medication    Nephrocalcinosis    Nephrolithiasis    Obesity    Osteopenia    Pancreatitis    PE (pulmonary embolism)    Proteinuria    Sarcoidosis    Smoker     Past Surgical History:  Procedure Laterality Date   ABDOMINAL EXPLORATION SURGERY     BACK SURGERY     CHOLECYSTECTOMY     SHOULDER ARTHROSCOPY Right 10/30/2020   Procedure: ARTHROSCOPY SHOULDER WITH EXTENSIVE DEBRIDEMENT;  Surgeon: Mcarthur Rossetti, MD;  Location: [facility];  Service: Orthopedics;  Laterality: Right;   SHOULDER SURGERY     TRACHEOSTOMY     closed   Social History:  reports that he quit smoking about 3 months ago. His smoking use included cigarettes. He started smoking about 4 months ago. He has a 3.75 pack-year smoking history. He has never used smokeless tobacco. He reports that he does not drink alcohol and does not use drugs.  Allergies  Allergen Reactions   Codeine Nausea Only   Hydromorphone Other (See Comments)    hallucinations    Family history reviewed and not pertinent Family History  Problem Relation Age of Onset   Hypertension Father    CVA Father    Lung cancer Father    Alzheimer's disease Mother    Hypertension Sister    Heart failure Sister      Prior to Admission medications   Medication Sig Start Date End Date Taking? Authorizing Provider  aspirin 81 MG tablet Take 1 tablet (81 mg total) by mouth daily. 02/01/13   [name], MD  atorvastatin (LIPITOR) 20 MG tablet Take 20 mg  by mouth daily. 02/02/19   [provider]  BLACK ELDERBERRY PO Take 2 each by mouth daily as needed (immune support). Gummies    [provider]  Chlorphen-Phenyleph-ASA (ALKA-SELTZER PLUS COLD PO) Take 2 tablets by mouth every 6 (six) hours as needed (cold symptoms).    [provider]  diltiazem (CARDIZEM CD) 360 MG 24 hr capsule Take 360 mg by mouth daily.    [provider]  diphenhydrAMINE (BENADRYL) 25 mg capsule Take 25 mg by mouth every 6 (six) hours as needed.    [provider]  docusate sodium (COLACE) 100 MG capsule Take 1 capsule (100 mg total) by mouth 2 (two) times daily. 03/24/21    Regalado, Belkys A, MD  empagliflozin (JARDIANCE) 10 MG TABS tablet Take 1 tablet (10 mg total) by mouth daily. Patient taking differently: Take 10 mg by mouth daily. Hold Patient is not taking. 03/25/21   Regalado, Belkys A, MD  fluticasone (FLONASE) 50 MCG/ACT nasal spray Place 2 sprays into both nostrils daily as needed for allergies or rhinitis.    [provider]  isosorbide-hydrALAZINE (BIDIL) 20-37.5 MG tablet Take 1 tablet by mouth 3 (three) times daily. 03/30/21   Tolia, Sunit, DO  labetalol (NORMODYNE) 300 MG tablet Take 300 mg by mouth 3 (three) times daily.    [provider]  predniSONE (DELTASONE) 5 MG tablet Take 5 mg by mouth daily. Reported on 11/02/2015    [provider]  sacubitril-valsartan (ENTRESTO) 49-51 MG Take 1 tablet by mouth 2 (two) times daily.    [provider]  tamsulosin (FLOMAX) 0.4 MG CAPS capsule Take 0.4 mg by mouth daily. 11/01/18   [provider]  traZODone (DESYREL) 150 MG tablet Take 1 tablet (150 mg total) by mouth at bedtime as needed for sleep. 12/22/20   Dohmeier, Asencion Partridge, MD    Physical Exam: Vitals:   04/07/21 1500 04/07/21 1530 04/07/21 1600 04/07/21 1615  BP: (!) 159/99 (!) 160/98 (!) 161/97 (!) 157/97  Pulse: 71 71 76 67  Resp: (!) 29 (!) 27 (!) 26 (!) 30  Temp:      TempSrc:      SpO2: 97% 97% 98% 97%    General: alert and oriented to time, place, and person. Appear in marked distress, affect appropriate Eyes: PERRL, Conjunctiva normal ENT: Oral Mucosa Clear, moist  Neck: positive JVD, no Abnormal Mass Or lumps Cardiovascular: S1 and S2 Present, no Murmur, peripheral pulses symmetrical Respiratory: increased respiratory effort, Bilateral Air entry equal and Decreased, no signs of accessory muscle use, bilateral  Crackles, no wheezes Abdomen: Bowel Sound present, Soft and no tenderness, no hernia Skin: no rashes  Extremities: minimal Pedal edema, no calf tenderness Neurologic: without any new  focal findings Gait not checked due to patient safety concerns  Data Reviewed: I have personally reviewed and interpreted labs, imaging as discussed below.  CBC: Recent Labs  Lab 04/01/21 2303 04/07/21 1251  WBC 9.0 10.0  NEUTROABS 6.9 8.8*  HGB 11.2* 11.6*  HCT 34.6* 34.7*  MCV 93.8 93.3  PLT 187 0000000   Basic Metabolic Panel: Recent Labs  Lab 04/01/21 2303 04/06/21 0726 04/07/21 1251  NA 134* 146* 140  K 3.3* 3.6 3.6  CL 105 109* 106  CO2 '25 25 28  '$ GLUCOSE 99 124* 145*  BUN '15 16 15  '$ CREATININE 1.73* 1.53* 1.63*  CALCIUM 8.1* 8.7 8.9  MG  --  2.0  --    GFR: CrCl cannot be calculated (Unknown  ideal weight.). Liver Function Tests: Recent Labs  Lab 04/01/21 2303  AST 36  ALT 39  ALKPHOS 66  BILITOT 0.7  PROT 5.6*  ALBUMIN 3.1*   No results for input(s): LIPASE, AMYLASE in the last 168 hours. No results for input(s): AMMONIA in the last 168 hours. Coagulation Profile: No results for input(s): INR, PROTIME in the last 168 hours. Cardiac Enzymes: No results for input(s): CKTOTAL, CKMB, CKMBINDEX, TROPONINI in the last 168 hours. BNP (last 3 results) Recent Labs    03/06/21 0712 03/30/21 0707 04/06/21 0725  PROBNP 1,171* 2,643* 5,653*   HbA1C: No results for input(s): HGBA1C in the last 72 hours. CBG: No results for input(s): GLUCAP in the last 168 hours. Lipid Profile: No results for input(s): CHOL, HDL, LDLCALC, TRIG, CHOLHDL, LDLDIRECT in the last 72 hours. Thyroid Function Tests: No results for input(s): TSH, T4TOTAL, FREET4, T3FREE, THYROIDAB in the last 72 hours. Anemia Panel: No results for input(s): VITAMINB12, FOLATE, FERRITIN, TIBC, IRON, RETICCTPCT in the last 72 hours. Urine analysis:    Component Value Date/Time   COLORURINE YELLOW 04/01/2021 2255   APPEARANCEUR CLEAR 04/01/2021 2255   LABSPEC 1.013 04/01/2021 2255   PHURINE 6.0 04/01/2021 2255   GLUCOSEU 150 (A) 04/01/2021 2255   HGBUR SMALL (A) 04/01/2021 2255   BILIRUBINUR  NEGATIVE 04/01/2021 2255   KETONESUR NEGATIVE 04/01/2021 2255   PROTEINUR >=300 (A) 04/01/2021 2255   UROBILINOGEN 0.2 11/16/2012 1004   NITRITE NEGATIVE 04/01/2021 2255   LEUKOCYTESUR NEGATIVE 04/01/2021 2255    Radiological Exams on Admission: DG Chest 2 View  Result Date: 04/07/2021 CLINICAL DATA:  Shortness of breath.  Leg swelling.  Fatigue. EXAM: CHEST - 2 VIEW COMPARISON:  04/01/2021. FINDINGS: Cardiomegaly venous congestion and bilateral interstitial prominence again noted. Right base and right upper lobe alveolar infiltrates. Progressive right pleural effusion. No pneumothorax. Degenerative changes scoliosis thoracic spine. IMPRESSION: 1. Cardiomegaly with pulmonary venous congestion, bilateral interstitial prominence, and progressive right pleural effusion. Findings consistent with CHF. 2. Right base and right upper lobe alveolar infiltrates. This could represent asymmetric pulmonary edema, pneumonia cannot be excluded. Electronically Signed   By: Marcello Moores  Register   On: 04/07/2021 13:31   EKG: Independently reviewed. normal EKG, normal sinus rhythm, nonspecific ST and T waves changes. Echocardiogram: 03/23/2021 EF 60 to [id number], grade 1 diastolic dysfunction, no valvular abnormality.  I reviewed all nursing notes, pharmacy notes, vitals, pertinent old records.  Assessment/Plan 1. Acute on chronic diastolic heart failure ([place]) Patient with complaints of shortness of breath now with hypoxia. Chest x-ray definitely congested and. Patient's weighing scale at home does not appear to be accurate. Not on any as needed Lasix at home. Will initiate IV Bumex which was used last admission. Continue to provide oxygen therapy. Monitor response. May require as needed Lasix on discharge. Requested wife to bring the weight scale in the hospital to monitor accuracy.  2.  Hypertensive emergency Blood pressure elevated. Continuing home regimen. Monitor.  3.  History of sarcoidosis. Acute hypoxic  respiratory failure. Patient has bilateral rhonchus sound.  Concern is whether this is also component of a sarcoid flareup. Will give 1 dose of IV Solu-Medrol and increase the dose of the prednisone for short taper from 5 mg daily to 10 mg daily. Recommend follow-up with pulmonologist.  4.  CKD 3B. Renal function at baseline.  At risk for further worsening. Monitor.  5.  History of CAD. History of CVA. ACS ruled out. Troponins are negative.  EKG unremarkable.  No chest  pain. Continue aspirin.  6.  Concern for OSA. Scheduled for sleep study outpatient although got canceled. Will require referral again.  Nutrition: Cardiac diet DVT Prophylaxis: Subcutaneous Heparin   Advance goals of care discussion: Full code   Consults: none  Family Communication: family was present at bedside, at the time of interview.  Opportunity was given to ask question and all questions were answered satisfactorily.    Author: Berle Mull, MD [facility] 04/07/2021 5:50 PM   To reach On-call, see care teams to locate the attending and reach out to them via [email or web address]. If 7PM-7AM, please contact night-coverage If you still have difficulty reaching the attending provider, please page the [facility] (Director on Call) for [facility] on amion for assistance.

## 2021-04-07 NOTE — ED Provider Notes (Signed)
Emergency Medicine Provider Triage Evaluation Note  [name] , a 65 y.o. male  was evaluated in triage.  Pt complains of sob that started a few days ago. Denies cough, cp. Reports bue and ble swelling.  Review of Systems  Positive: Sob, leg swelling and arm swelling Negative: Chest pain, cough  Physical Exam  BP (!) 146/90 (BP Location: Right Arm)   Pulse 71   Temp 99 F (37.2 C) (Oral)   Resp 20   SpO2 (!) 89%  Gen:   Awake, no distress   Resp:  Normal effort  MSK:   Moves extremities without difficulty  Other:  Edema to the ble, bibasilar crackles  Medical Decision Making  Medically screening exam initiated at 12:48 PM.  Appropriate orders placed.  [name] was informed that the remainder of the evaluation will be completed by another provider, this initial triage assessment does not replace that evaluation, and the importance of remaining in the ED until their evaluation is complete.     Bishop Dublin 04/07/21 1250    Wyvonnia Dusky, MD 04/07/21 1740

## 2021-04-08 ENCOUNTER — Encounter (HOSPITAL_COMMUNITY): Payer: Self-pay | Admitting: Internal Medicine

## 2021-04-08 DIAGNOSIS — I10 Essential (primary) hypertension: Secondary | ICD-10-CM

## 2021-04-08 DIAGNOSIS — G4733 Obstructive sleep apnea (adult) (pediatric): Secondary | ICD-10-CM

## 2021-04-08 LAB — HEMOGLOBIN A1C
Hgb A1c MFr Bld: 5.7 % — ABNORMAL HIGH (ref 4.8–5.6)
Mean Plasma Glucose: 116.89 mg/dL

## 2021-04-08 LAB — ETHANOL: Alcohol, Ethyl (B): <10 mg/dL (ref <10)

## 2021-04-08 LAB — BASIC METABOLIC PANEL
Anion gap: 11 (ref 5–15)
BUN: 17 mg/dL (ref 8–23)
CO2: 26 mmol/L (ref 22–32)
Calcium: 9.2 mg/dL (ref 8.9–10.3)
Chloride: 103 mmol/L (ref 98–111)
Creatinine, Ser: 1.63 mg/dL — ABNORMAL HIGH (ref 0.61–1.24)
GFR, Estimated: 47 mL/min — ABNORMAL LOW (ref >60)
Glucose, Bld: 162 mg/dL — ABNORMAL HIGH (ref 70–99)
Potassium: 3.6 mmol/L (ref 3.5–5.1)
Sodium: 140 mmol/L (ref 135–145)

## 2021-04-08 LAB — CBC
HCT: 34.9 % — ABNORMAL LOW (ref 39.0–52.0)
Hemoglobin: 11.8 g/dL — ABNORMAL LOW (ref 13.0–17.0)
MCH: 31 pg (ref 26.0–34.0)
MCHC: 33.8 g/dL (ref 30.0–36.0)
MCV: 91.6 fL (ref 80.0–100.0)
Platelets: 188 10*3/uL (ref 150–400)
RBC: 3.81 MIL/uL — ABNORMAL LOW (ref 4.22–5.81)
RDW: 17.3 % — ABNORMAL HIGH (ref 11.5–15.5)
WBC: 8.1 10*3/uL (ref 4.0–10.5)
nRBC: 0 % (ref 0.0–0.2)

## 2021-04-08 MED ORDER — BUMETANIDE 0.25 MG/ML IJ SOLN
1.0000 mg | Freq: Every day | INTRAMUSCULAR | Status: DC
  Administered 2021-04-09: 1 mg via INTRAVENOUS
  Filled 2021-04-08: qty 4

## 2021-04-08 MED ORDER — DAPAGLIFLOZIN PROPANEDIOL 10 MG PO TABS
10.0000 mg | ORAL_TABLET | Freq: Every day | ORAL | Status: DC
  Administered 2021-04-08 – 2021-04-09 (×2): 10 mg via ORAL
  Filled 2021-04-08 (×2): qty 1

## 2021-04-08 NOTE — Telephone Encounter (Signed)
Called pt wife to see if he can stop by today. Pt wife mention pt is in the cardiovascular unit Room#3E16.

## 2021-04-08 NOTE — Evaluation (Signed)
Physical Therapy Evaluation & Discharge Patient Details Name: [name] MRN: [id number] DOB: Aug 04, 1956 Today's Date: 04/08/2021   History of Present Illness  Pt is a 65 y.o. male admitted 04/07/21 with SOB, cough, orthopnea, LE swelling. Workup for acute on chronic HF, HTN urgency. Of note, recent admission 7/17-7/19/22 for CHF. Other PMH includes sarcoidosis (chronic steroids), CKD 3, uncontrolled HTN, PE (on anticoagulants), OSA, R rotator cuff repair (11/2020).   Clinical Impression  Patient evaluated by Physical Therapy with no further acute PT needs identified. PTA, pt independent, active with family, retired from work, lives with supportive wife. Today, pt independent with mobility and ADL tasks; pt reports little to no SOB. All education has been completed and the patient has no further questions; encouraged frequent mobility, including hallway ambulation, during admission. Acute PT is signing off. Thank you for this referral.  SpO2 88-92% on RA with ambulation, briefly down to 85%, pt asymptomatic *Recommend re-check of ambulatory saturations prior to d/c   Follow Up Recommendations No PT follow up    Equipment Recommendations  None recommended by PT    Recommendations for Other Services       Precautions / Restrictions Precautions Precautions: Other (comment) Precaution Comments: Watch SpO2 Restrictions Weight Bearing Restrictions: No      Mobility  Bed Mobility Overal bed mobility: Independent                  Transfers Overall transfer level: Independent                  Ambulation/Gait Ambulation/Gait assistance: Independent Gait Distance (Feet): 500 Feet Assistive device: None Gait Pattern/deviations: WFL(Within Functional Limits)   Gait velocity interpretation: >2.62 ft/sec, indicative of community ambulatory    Stairs            Wheelchair Mobility    Modified Rankin (Stroke Patients Only)       Balance Overall balance  assessment: No apparent balance deficits (not formally assessed)                                           Pertinent Vitals/Pain Pain Assessment: No/denies pain    Home Living Family/patient expects to be discharged to:: Private residence Living Arrangements: Spouse/significant other Available Help at Discharge: Family;Available PRN/intermittently Type of Home: House Home Access: Level entry     Home Layout: Two level;Bed/bath upstairs Home Equipment: None Additional Comments: Reports that spouse works    Prior Function Level of Independence: Independent         Comments: I w/ ADLs/IADLs; drives; mows own lawn; retired from 4 yrs working Musician; enjoys playing the piano; keeps his grandsons during the day, ages 2-9yo     Engineer, manufacturing [employer]   Dominant Hand: Right    Extremity/Trunk Assessment   Upper Extremity Assessment Upper Extremity Assessment: Overall WFL for tasks assessed    Lower Extremity Assessment Lower Extremity Assessment: Overall WFL for tasks assessed    Cervical / Trunk Assessment Cervical / Trunk Assessment: Normal  Communication   Communication: No difficulties  Cognition Arousal/Alertness: Awake/alert Behavior During Therapy: WFL for tasks assessed/performed Overall Cognitive Status: Within Functional Limits for tasks assessed                                 General Comments: WFL for simple tasks,  not formally assessed; pt reports losing track of what time of day it is      General Comments General comments (skin integrity, edema, etc.): SpO2 briefly down to 85% with ambulation, maintaininy 88-92% on RA majority of walk; pt reports little to no SOB    Exercises     Assessment/Plan    PT Assessment Patent does not need any further PT services  PT Problem List         PT Treatment Interventions      PT Goals (Current goals can be found in the Care Plan section)  Acute Rehab PT Goals PT Goal  Formulation: All assessment and education complete, DC therapy    Frequency     Barriers to discharge        Co-evaluation               AM-PAC PT "6 Clicks" Mobility  Outcome Measure Help needed turning from your back to your side while in a flat bed without using bedrails?: None Help needed moving from lying on your back to sitting on the side of a flat bed without using bedrails?: None Help needed moving to and from a bed to a chair (including a wheelchair)?: None Help needed standing up from a chair using your arms (e.g., wheelchair or bedside chair)?: None Help needed to walk in hospital room?: None Help needed climbing 3-5 steps with a railing? : None 6 Click Score: 24    End of Session   Activity Tolerance: Patient tolerated treatment well Patient left: in chair;with call bell/phone within reach Nurse Communication: Mobility status PT Visit Diagnosis: Other abnormalities of gait and mobility (R26.89)    Time: [id number] PT Time Calculation (min) (ACUTE ONLY): 18 min   Charges:   PT Evaluation $PT Eval Low Complexity: [place], PT, DPT Acute Rehabilitation Services  Pager [phone] Office [place] 04/08/2021, 9:53 AM

## 2021-04-08 NOTE — Telephone Encounter (Signed)
Requested that he come in today for office visit. But his wife states that he went to ER yesterday.

## 2021-04-08 NOTE — Consult Note (Signed)
CARDIOLOGY CONSULT NOTE  Patient ID: [name] MRN: [id number] DOB/AGE: May 14, 1956 65 y.o.  Admit date: 04/07/2021 Attending physician: Oswald Hillock, MD Primary Physician:  [name], MD Outpatient Cardiologist: Rex Kras, DO, [facility] Inpatient Cardiologist: Rex Kras, DO, [facility]  Reason of consultation: Congestive heart failure Referring physician: Oswald Hillock, MD  Chief complaint: Shortness of breath  HPI:  [name] is a 65 y.o. Caucasian male who presents with a chief complaint of " shortness of breath." His past medical history and cardiovascular risk factors include: Heart failure with preserved EF, sarcoidosis, HTN, former smoker, Hx of PE (was on Xarelto for 1 year), OSA not on CPAP, MSSA Bacteremia (March 2022), Hx of cardiac arrest (2002 during his hospitalization for pancreatitis per wife).   Patient presents to the hospital with a chief complaint of shortness of breath.  Patient states that for the last 2 days he has been having progressive shortness of breath at rest and with effort related activities.  He has been having lower extremity swelling, coughing, and orthopnea.  He was recently hospitalized for congestive heart failure from 03/22/2021 - 03/24/2021 during which time his medications were titrated and he was diuresed.  He was supposed to see me in transition of care follow-up visit shortly after his hospitalization but labs noted acute kidney injury and therefore he was recommended to hold off taking Jardiance for 1 week with the hopes of improving his renal function.  He just had his lab work 2 days ago on 04/06/2021 which noted improvement in serum creatinine level but further elevation in NT proBNP.  I reached out to him to be seen in my office earlier this morning but was told that he had come to the hospital due to his symptoms.  Spoke to both the patient and his wife over the phone with regards to recurrent hospitalizations for congestive heart failure.   Both the patient and wife state that he has been taking his medications regularly as scheduled.  But has been indiscretion with salt and fluid restrictions.  ALLERGIES: Allergies  Allergen Reactions   Codeine Nausea Only   Hydromorphone Other (See Comments)    hallucinations     PAST MEDICAL HISTORY: Past Medical History:  Diagnosis Date   Back pain    CKD (chronic kidney disease), stage III (HCC)    CVA (cerebral infarction)    Diastolic dysfunction    ED (erectile dysfunction)    GERD (gastroesophageal reflux disease)    Hypercalcemia    Hyperlipidemia    Hypertension    Insomnia    Long-term use of high-risk medication    Nephrocalcinosis    Nephrolithiasis    Obesity    Osteopenia    Pancreatitis    PE (pulmonary embolism)    Proteinuria    Sarcoidosis    Smoker     PAST SURGICAL HISTORY: Past Surgical History:  Procedure Laterality Date   ABDOMINAL EXPLORATION SURGERY     BACK SURGERY     CHOLECYSTECTOMY     SHOULDER ARTHROSCOPY Right 10/30/2020   Procedure: ARTHROSCOPY SHOULDER WITH EXTENSIVE DEBRIDEMENT;  Surgeon: Mcarthur Rossetti, MD;  Location: [place];  Service: Orthopedics;  Laterality: Right;   SHOULDER SURGERY     TRACHEOSTOMY     closed    FAMILY HISTORY: The patient family history includes Alzheimer's disease in his mother; CVA in his father; Heart failure in his sister; Hypertension in his father and sister; Lung cancer in his father.  SOCIAL HISTORY:  The patient  reports that he quit smoking about 3 months ago. His smoking use included cigarettes. He started smoking about 4 months ago. He has a 3.75 pack-year smoking history. He has never used smokeless tobacco. He reports that he does not drink alcohol and does not use drugs.  MEDICATIONS: Current Outpatient Medications  Medication Instructions   aspirin 81 mg, Oral, Daily   atorvastatin (LIPITOR) 20 mg, Oral, Daily   BLACK ELDERBERRY PO 2 each, Oral, Daily PRN,  Gummies   Chlorphen-Phenyleph-ASA (ALKA-SELTZER PLUS COLD PO) 2 tablets, Oral, Every 6 hours PRN   diltiazem (CARDIZEM CD) 360 mg, Daily   diphenhydrAMINE (BENADRYL) 25 mg, Oral, Every 6 hours PRN   docusate sodium (COLACE) 100 mg, Oral, 2 times daily   empagliflozin (JARDIANCE) 10 mg, Oral, Daily   fluticasone (FLONASE) 50 MCG/ACT nasal spray 2 sprays, Each Nare, Daily PRN   isosorbide-hydrALAZINE (BIDIL) 20-37.5 MG tablet 1 tablet, Oral, 3 times daily   labetalol (NORMODYNE) 300 mg, 3 times daily   predniSONE (DELTASONE) 5 mg, Oral, Daily, Reported on 11/02/2015   sacubitril-valsartan (ENTRESTO) 49-51 MG 1 tablet, Oral, 2 times daily   tamsulosin (FLOMAX) 0.4 mg, Oral, Daily   traZODone (DESYREL) 150 mg, Oral, At bedtime PRN    REVIEW OF SYSTEMS: Review of Systems  Constitutional: Negative for chills and fever.  HENT:  Negative for hoarse voice and nosebleeds.   Eyes:  Negative for discharge, double vision and pain.  Cardiovascular:  Positive for dyspnea on exertion, leg swelling and orthopnea. Negative for chest pain, claudication, near-syncope, palpitations, paroxysmal nocturnal dyspnea and syncope.  Respiratory:  Positive for shortness of breath. Negative for hemoptysis.   Musculoskeletal:  Negative for muscle cramps and myalgias.  Gastrointestinal:  Negative for abdominal pain, constipation, diarrhea, hematemesis, hematochezia, melena, nausea and vomiting.  Neurological:  Negative for dizziness and light-headedness.  All other systems reviewed and are negative.  PHYSICAL EXAM: Vitals with BMI 04/08/2021 04/08/2021 04/08/2021  Height - - -  Weight - - -  BMI - - -  Systolic [id number]  Diastolic 91 86 97  Pulse 66 63 75     Intake/Output Summary (Last 24 hours) at 04/08/2021 1825 Last data filed at 04/08/2021 1812 Gross per 24 hour  Intake 580 ml  Output 850 ml  Net -270 ml    Net IO Since Admission: -270 mL [04/08/21 1825]  CONSTITUTIONAL: Age-appropriate male,  well-developed and well-nourished. No acute distress.  SKIN: Skin is warm and dry. No rash noted. No cyanosis. No pallor. No jaundice HEAD: Normocephalic and atraumatic.  EYES: No scleral icterus MOUTH/THROAT: Moist oral membranes.  NECK: JVP present. No thyromegaly noted. No carotid bruits  LYMPHATIC: No visible cervical adenopathy.  CHEST Normal respiratory effort. No intercostal retractions  LUNGS: Clear to auscultation bilaterally with rales noted at the bases.  No stridor. No wheezes. CARDIOVASCULAR: Regular rate and rhythm, positive [id number], soft holosystolic murmur heard at the apex, no gallops or rubs. ABDOMINAL: Obese, soft, nontender, nondistended, positive bowel sounds in all 4 quadrants, no apparent ascites.  EXTREMITIES: +1 bilateral pitting edema.  No peripheral edema  HEMATOLOGIC: No significant bruising NEUROLOGIC: Oriented to person, place, and time. Nonfocal. Normal muscle tone.  PSYCHIATRIC: Normal mood and affect. Normal behavior. Cooperative  RADIOLOGY: DG Chest 2 View  Result Date: 04/07/2021 CLINICAL DATA:  Shortness of breath.  Leg swelling.  Fatigue. EXAM: CHEST - 2 VIEW COMPARISON:  04/01/2021. FINDINGS: Cardiomegaly venous congestion and bilateral interstitial prominence again  noted. Right base and right upper lobe alveolar infiltrates. Progressive right pleural effusion. No pneumothorax. Degenerative changes scoliosis thoracic spine. IMPRESSION: 1. Cardiomegaly with pulmonary venous congestion, bilateral interstitial prominence, and progressive right pleural effusion. Findings consistent with CHF. 2. Right base and right upper lobe alveolar infiltrates. This could represent asymmetric pulmonary edema, pneumonia cannot be excluded. Electronically Signed   By: Marcello Moores  Register   On: 04/07/2021 13:31    LABORATORY DATA: Lab Results  Component Value Date   WBC 8.1 04/08/2021   HGB 11.8 (L) 04/08/2021   HCT 34.9 (L) 04/08/2021   MCV 91.6 04/08/2021   PLT 188 04/08/2021     Recent Labs  Lab 04/01/21 2303 04/06/21 0726 04/08/21 0232  NA 134*   < > 140  K 3.3*   < > 3.6  CL 105   < > 103  CO2 25   < > 26  BUN 15   < > 17  CREATININE 1.73*   < > 1.63*  CALCIUM 8.1*   < > 9.2  PROT 5.6*  --   --   BILITOT 0.7  --   --   ALKPHOS 66  --   --   ALT 39  --   --   AST 36  --   --   GLUCOSE 99   < > 162*   < > = values in this interval not displayed.    Lipid Panel  Lab Results  Component Value Date   CHOL 123 03/15/2019   HDL 32 (L) 03/15/2019   LDLCALC 65 03/15/2019   TRIG 130 03/15/2019   CHOLHDL 3.8 03/15/2019    BNP (last 3 results) Recent Labs    03/24/21 0902 04/01/21 2303 04/07/21 1251  BNP 1,944.7* 2,627.5* 3,953.6*    HEMOGLOBIN A1C Lab Results  Component Value Date   HGBA1C 5.8 (H) 03/15/2019   MPG 120 03/15/2019    Cardiac Panel (last 3 results) Recent Labs    04/07/21 1251 04/07/21 1457  TROPONINIHS 17 13     TSH Recent Labs    11/19/20 0233  TSH 1.247     CARDIAC DATABASE: EKG: 04/07/2021: Normal sinus rhythm 73 bpm, nonspecific T wave abnormality, without underlying injury pattern   Echocardiogram: 03/23/2021: LVEF 60 to 65%, mild LVH, grade 1 diastolic impairment, moderately elevated PASP 49 mmHg, severely dilated left atrium, mild MR, mild AR.   Stress Testing: No results found for this or any previous visit from the past 1095 days.   Heart Catheterization: None  IMPRESSION & RECOMMENDATIONS: [name] is a 65 y.o. Caucasian male whose past medical history and cardiovascular risk factors include: Heart failure with preserved EF, sarcoidosis, HTN, former smoker, Hx of PE (was on Xarelto for 1 year), OSA not on CPAP, MSSA Bacteremia (March 2022), Hx of cardiac arrest (2002 during his hospitalization for pancreatitis per wife).  Acute on chronic exacerbation of HFpEF, stage C, NYHA class III: Most likely exacerbated by salt and fluid indiscretion. Spoke to the patient's wife over the phone to  discuss why he has been having reoccurrence of hospitalization despite being on good medical therapy as tolerated by his hemodynamics and laboratory values. Start Farxiga 10 mg p.o. daily Change Bumex 1 mg IV push daily. Continue diltiazem, BiDil, labetalol. Strict I's and O's and daily weights Check BNP in the morning Check hemoglobin A1c and fasting lipid profile. Check serum alcohol level. Surface ECG nonischemic. High-sensitivity negative x2 Recently had an echocardiogram during prior hospitalization in  July 2022. Patient is in the process of being reevaluated for sleep apnea. Consider dietary consult during this hospitalization to educate the patient with regards to heart failure management.  Hypertensive urgency with chronic kidney disease stage III: Present on admission. Improving. Continue to further uptitrate GDMT as hemodynamics and kidney function allows. Educated on the importance of low-salt diet.  History of OSA: Patient is encouraged to follow-up with sleep medicine to be reevaluated for sleep apnea.  History of pulmonary embolism: Currently on oral anticoagulation  Total encounter time 85 minutes. *Total Encounter Time as defined by the Centers for Medicare and Medicaid Services includes, in addition to the face-to-face time of a patient visit (documented in the note above) non-face-to-face time: obtaining and reviewing outside history, ordering and reviewing medications, tests or procedures, care coordination (interacted with the attending physician to formulated plan of care, and also called the patient's wife [name] over the phone to discuss his care and the potential causes of why he is requiring hospitalizations for congestive heart failure repeatedly over the last several months) and documentation in the medical record.  Patient's questions and concerns were addressed to his satisfaction. He voices understanding of the instructions provided during this encounter.    This note was created using a voice recognition software as a result there may be grammatical errors inadvertently enclosed that do not reflect the nature of this encounter. Every attempt is made to correct such errors.  Mechele Claude [facility]  Pager: [phone] Office: [phone] 04/08/2021, 6:25 PM

## 2021-04-08 NOTE — Progress Notes (Signed)
[facility]  PROGRESS NOTE  [name] [id number] DOB: 11/24/1955 DOA: 04/07/2021 PCP: [name], MD   Brief HPI:   65 year old male with past medical history of CKD stage IIIb, CVA, chronic diastolic CHF, hyperlipidemia, hypertension, sarcoidosis, obesity presented with worsening shortness of breath.  He was recently hospitalized between 7/17 to 7/19 for acute on chronic CHF and was treated with IV Bumex.  Patient was not discharged on diuretics.  For past 2 days he has been having worsening shortness of breath along with cough and orthopnea. In the ED O2 sats was 87% on room air, placed on 2 L/min of oxygen via nasal cannula.  Chest x-ray shows congestion.   Subjective   Patient seen and examined, denies shortness of breath.   Assessment/Plan:     Acute on chronic diastolic CHF -Improved with IV Bumex 1 mg twice daily -BNP significantly elevated  3953.6 -Patient is not requiring oxygen -We will continue with diuresis -Cardiology to see patient in a.m.  Hypertensive urgency -Resolved -Continue labetalol, BiDil, diltiazem  History of sarcoidosis -Continue prednisone 10 mg daily -Dose of prednisone increased to 10 mg -Follow-up pulmonologist as outpatient  CKD stage IIIb -Creatinine 1.63, at baseline  History of CVA -Stable  History of CAD -Acute coronary syndrome ruled out -Troponins are negative  Concern for OSA -Scheduled for sleep study as outpatient   Scheduled medications:    aspirin EC  81 mg Oral Daily   atorvastatin  20 mg Oral Daily   bumetanide (BUMEX) IV  1 mg Intravenous BID   diltiazem  360 mg Oral Daily   heparin  5,000 Units Subcutaneous Q8H   isosorbide-hydrALAZINE  1 tablet Oral TID   labetalol  300 mg Oral TID   predniSONE  10 mg Oral Q breakfast   tamsulosin  0.4 mg Oral Daily         Data Reviewed:   CBG:  No results for input(s): GLUCAP in the last 168 hours.  SpO2: 92 % O2 Flow Rate (L/min): 2 L/min     Vitals:   04/08/21 0400 04/08/21 0714 04/08/21 1121 04/08/21 1602  BP: (!) 145/81 (!) 171/97 135/86 (!) 139/91  Pulse:  75 63 66  Resp:  '18 18 18  '$ Temp: 98.2 F (36.8 C) 98.3 F (36.8 C) 98 F (36.7 C) 98 F (36.7 C)  TempSrc: Oral Oral Oral Oral  SpO2: 95% 93% 94% 92%  Weight:      Height:         Intake/Output Summary (Last 24 hours) at 04/08/2021 1648 Last data filed at 04/08/2021 1300 Gross per 24 hour  Intake 460 ml  Output 650 ml  Net -190 ml    08/01 1901 - 08/03 0700 In: 240 [P.O.:240] Out: 650 [Urine:650]  Filed Weights   04/07/21 2120 04/08/21 0233  Weight: 106.4 kg 106.2 kg    CBC:  Recent Labs  Lab 04/01/21 2303 04/07/21 1251 04/08/21 0232  WBC 9.0 10.0 8.1  HGB 11.2* 11.6* 11.8*  HCT 34.6* 34.7* 34.9*  PLT 187 195 188  MCV 93.8 93.3 91.6  MCH 30.4 31.2 31.0  MCHC 32.4 33.4 33.8  RDW 19.0* 17.6* 17.3*  LYMPHSABS 1.0 0.5*  --   MONOABS 0.8 0.6  --   EOSABS 0.2 0.1  --   BASOSABS 0.0 0.0  --     Complete metabolic panel:  Recent Labs  Lab 04/01/21 2303 04/06/21 0726 04/07/21 1251 04/08/21 0232  NA 134* 146* 140 140  K  3.3* 3.6 3.6 3.6  CL 105 109* 106 103  CO2 '25 25 28 26  '$ GLUCOSE 99 124* 145* 162*  BUN '15 16 15 17  '$ CREATININE 1.73* 1.53* 1.63* 1.63*  CALCIUM 8.1* 8.7 8.9 9.2  AST 36  --   --   --   ALT 39  --   --   --   ALKPHOS 66  --   --   --   BILITOT 0.7  --   --   --   ALBUMIN 3.1*  --   --   --   MG  --  2.0  --   --   BNP 2,627.5*  --  3,953.6*  --     No results for input(s): LIPASE, AMYLASE in the last 168 hours.  Recent Labs  Lab 04/01/21 2256 04/01/21 2303 04/07/21 1251  BNP  --  2,627.5* 3,953.6*  SARSCOV2NAA NEGATIVE  --  NEGATIVE    ------------------------------------------------------------------------------------------------------------------ No results for input(s): CHOL, HDL, LDLCALC, TRIG, CHOLHDL, LDLDIRECT in the last 72 hours.  Lab Results  Component Value Date   HGBA1C 5.8 (H)  03/15/2019   ------------------------------------------------------------------------------------------------------------------ No results for input(s): TSH, T4TOTAL, T3FREE, THYROIDAB in the last 72 hours.  Invalid input(s): FREET3 ------------------------------------------------------------------------------------------------------------------ No results for input(s): VITAMINB12, FOLATE, FERRITIN, TIBC, IRON, RETICCTPCT in the last 72 hours.  Coagulation profile No results for input(s): INR, PROTIME in the last 168 hours. No results for input(s): DDIMER in the last 72 hours.  Cardiac Enzymes No results for input(s): CKTOTAL, CKMB, CKMBINDEX, TROPONINI in the last 168 hours.  ------------------------------------------------------------------------------------------------------------------    Component Value Date/Time   BNP 3,953.6 (H) 04/07/2021 1251     Antibiotics: Anti-infectives (From admission, onward)    None        Radiology Reports  DG Chest 2 View  Result Date: 04/07/2021 CLINICAL DATA:  Shortness of breath.  Leg swelling.  Fatigue. EXAM: CHEST - 2 VIEW COMPARISON:  04/01/2021. FINDINGS: Cardiomegaly venous congestion and bilateral interstitial prominence again noted. Right base and right upper lobe alveolar infiltrates. Progressive right pleural effusion. No pneumothorax. Degenerative changes scoliosis thoracic spine. IMPRESSION: 1. Cardiomegaly with pulmonary venous congestion, bilateral interstitial prominence, and progressive right pleural effusion. Findings consistent with CHF. 2. Right base and right upper lobe alveolar infiltrates. This could represent asymmetric pulmonary edema, pneumonia cannot be excluded. Electronically Signed   By: Marcello Moores  Register   On: 04/07/2021 13:31      DVT prophylaxis: Heparin  Code Status: Full code  Family Communication: No family at bedside   Consultants:   Procedures:     Objective    Physical  Examination:   General-appears in no acute distress Heart-S1-S2, regular, no murmur auscultated Lungs-clear to auscultation bilaterally, no wheezing or crackles auscultated Abdomen-soft, nontender, no organomegaly Extremities-no edema in the lower extremities Neuro-alert, oriented x3, no focal deficit noted  Status is: Inpatient  Dispo: The patient is from: Home              Anticipated d/c is to: Home              Anticipated d/c date is: 04/10/2021              Patient currently not stable for discharge  Barrier to discharge-CHF exacerbation  COVID-19 Labs  No results for input(s): DDIMER, FERRITIN, LDH, CRP in the last 72 hours.  Lab Results  Component Value Date   SARSCOV2NAA NEGATIVE 04/07/2021   [place] NEGATIVE 04/01/2021   [place] NEGATIVE 03/22/2021  [place] NEGATIVE 11/18/2020    Microbiology  Recent Results (from the past 240 hour(s))  SARS CORONAVIRUS 2 (TAT 6-24 HRS) Nasopharyngeal Nasopharyngeal Swab     Status: None   Collection Time: 04/01/21 10:56 PM   Specimen: Nasopharyngeal Swab  Result Value Ref Range Status   SARS Coronavirus 2 NEGATIVE NEGATIVE Final    Comment: (NOTE) SARS-CoV-2 target nucleic acids are NOT DETECTED.  The SARS-CoV-2 RNA is generally detectable in upper and lower respiratory specimens during the acute phase of infection. Negative results do not preclude SARS-CoV-2 infection, do not rule out co-infections with other pathogens, and should not be used as the sole basis for treatment or other patient management decisions. Negative results must be combined with clinical observations, patient history, and epidemiological information. The expected result is Negative.  Fact Sheet for Patients: SugarRoll.be  Fact Sheet for Healthcare Providers: https://www.woods-mathews.com/  This test is not yet approved or cleared by the [place] FDA and  has been authorized for detection  and/or diagnosis of SARS-CoV-2 by FDA under an Emergency Use Authorization (EUA). This EUA will remain  in effect (meaning this test can be used) for the duration of the COVID-19 declaration under Se ction 564(b)(1) of the Act, 21 U.S.C. section 360bbb-3(b)(1), unless the authorization is terminated or revoked sooner.  Performed at [facility], [place] [address] [place], [place] [address]   Resp Panel by RT-PCR (Flu A&B, Covid) Nasopharyngeal Swab     Status: None   Collection Time: 04/07/21 12:51 PM   Specimen: Nasopharyngeal Swab; Nasopharyngeal(NP) swabs in vial transport medium  Result Value Ref Range Status   SARS Coronavirus 2 by RT PCR NEGATIVE NEGATIVE Final    Comment: (NOTE) SARS-CoV-2 target nucleic acids are NOT DETECTED.  The SARS-CoV-2 RNA is generally detectable in upper respiratory specimens during the acute phase of infection. The lowest concentration of SARS-CoV-2 viral copies this assay can detect is 138 copies/mL. A negative result does not preclude SARS-Cov-2 infection and should not be used as the sole basis for treatment or other patient management decisions. A negative result may occur with  improper specimen collection/handling, submission of specimen other than nasopharyngeal swab, presence of viral mutation(s) within the areas targeted by this assay, and inadequate number of viral copies(<138 copies/mL). A negative result must be combined with clinical observations, patient history, and epidemiological information. The expected result is Negative.  Fact Sheet for Patients:  EntrepreneurPulse.com.au  Fact Sheet for Healthcare Providers:  IncredibleEmployment.be  This test is no t yet approved or cleared by the [place] FDA and  has been authorized for detection and/or diagnosis of SARS-CoV-2 by FDA under an Emergency Use Authorization (EUA). This EUA will remain  in effect (meaning this test can be used)  for the duration of the COVID-19 declaration under Section 564(b)(1) of the Act, 21 U.S.C.section 360bbb-3(b)(1), unless the authorization is terminated  or revoked sooner.       Influenza A by PCR NEGATIVE NEGATIVE Final   Influenza B by PCR NEGATIVE NEGATIVE Final    Comment: (NOTE) The Xpert Xpress SARS-CoV-2/FLU/RSV plus assay is intended as an aid in the diagnosis of influenza from Nasopharyngeal swab specimens and should not be used as a sole basis for treatment. Nasal washings and aspirates are unacceptable for Xpert Xpress SARS-CoV-2/FLU/RSV testing.  Fact Sheet for Patients: EntrepreneurPulse.com.au  Fact Sheet for Healthcare Providers: IncredibleEmployment.be  This test is not yet approved or cleared by the [place] FDA and has been authorized for detection  and/or diagnosis of SARS-CoV-2 by FDA under an Emergency Use Authorization (EUA). This EUA will remain in effect (meaning this test can be used) for the duration of the COVID-19 declaration under Section 564(b)(1) of the Act, 21 U.S.C. section 360bbb-3(b)(1), unless the authorization is terminated or revoked.  Performed at [facility], [place] [address], [place], [place] [address]              [facility] If 7PM-7AM, please contact night-coverage at [email or web address], Office  [phone]   04/08/2021, 4:48 PM  LOS: 1 day

## 2021-04-09 ENCOUNTER — Encounter (HOSPITAL_COMMUNITY): Payer: Self-pay | Admitting: Internal Medicine

## 2021-04-09 DIAGNOSIS — J441 Chronic obstructive pulmonary disease with (acute) exacerbation: Secondary | ICD-10-CM

## 2021-04-09 DIAGNOSIS — J9601 Acute respiratory failure with hypoxia: Secondary | ICD-10-CM

## 2021-04-09 DIAGNOSIS — Z9119 Patient's noncompliance with other medical treatment and regimen: Secondary | ICD-10-CM

## 2021-04-09 DIAGNOSIS — Z91199 Patient's noncompliance with other medical treatment and regimen due to unspecified reason: Secondary | ICD-10-CM

## 2021-04-09 DIAGNOSIS — D869 Sarcoidosis, unspecified: Secondary | ICD-10-CM

## 2021-04-09 LAB — LIPID PANEL
Cholesterol: 111 mg/dL (ref 0–200)
HDL: 62 mg/dL (ref >40)
LDL Cholesterol: 38 mg/dL (ref 0–99)
Total CHOL/HDL Ratio: 1.8 RATIO
Triglycerides: 53 mg/dL (ref <150)
VLDL: 11 mg/dL (ref 0–40)

## 2021-04-09 LAB — BRAIN NATRIURETIC PEPTIDE: B Natriuretic Peptide: 1645.7 pg/mL — ABNORMAL HIGH (ref 0.0–100.0)

## 2021-04-09 MED ORDER — BUMETANIDE 1 MG PO TABS: 1.0000 mg | ORAL_TABLET | Freq: Every day | ORAL | 0 refills | Status: DC

## 2021-04-09 MED ORDER — DAPAGLIFLOZIN PROPANEDIOL 10 MG PO TABS: 10.0000 mg | ORAL_TABLET | Freq: Every day | ORAL | 0 refills | Status: DC

## 2021-04-09 NOTE — Progress Notes (Signed)
Progress Note  Patient Name: [name] Date of Encounter: 04/09/2021  Attending physician: No att. providers found Primary care provider: [name], MD Primary Cardiologist: Rex Kras, DO, [facility] Consultant:Braydn Carneiro [place], DO, [facility]  Subjective: [name] is a 65 y.o. male who was seen and examined at bedside  Shortness of breath is better  Currently on RA Net negative UOP of 2.4L Denies CP, orthopnea, paroxysmal nocturnal dyspnea.  Lower extremity swelling present but improved.   Objective: Vital Signs in the last 24 hours: Temp:  [97.7 F (36.5 C)-98.3 F (36.8 C)] 98.3 F (36.8 C) (08/04 0729) Pulse Rate:  [58-63] 63 (08/04 0729) Resp:  [18-20] 19 (08/04 0729) BP: (129-148)/(73-93) 148/93 (08/04 0729) SpO2:  [93 %-94 %] 94 % (08/04 0729) Weight:  [107.4 kg] 107.4 kg (08/04 0009)  Intake/Output:  Intake/Output Summary (Last 24 hours) at 04/09/2021 2212 Last data filed at 04/09/2021 1029 Gross per 24 hour  Intake 480 ml  Output 1875 ml  Net -1395 ml    Net IO Since Admission: -2,350 mL [04/09/21 2212]  Weights:  Filed Weights   04/07/21 2120 04/08/21 0233 04/09/21 0009  Weight: 106.4 kg 106.2 kg 107.4 kg    Telemetry: Personally reviewed.  Physical examination: PHYSICAL EXAM: Vitals with BMI 04/09/2021 04/09/2021 04/09/2021  Height - - -  Weight - - 236 lbs 13 oz  BMI - - [id number]  Systolic [id number]  Diastolic 93 86 73  Pulse 63 61 58    CONSTITUTIONAL: Age-appropriate male, well-developed and well-nourished. No acute distress. SKIN: Skin is warm and dry. No rash noted. No cyanosis. No pallor. No jaundice HEAD: Normocephalic and atraumatic. EYES: No scleral icterus MOUTH/THROAT: Moist oral membranes. NECK: no JVP. No thyromegaly noted. No carotid bruits LYMPHATIC: No visible cervical adenopathy. CHEST Normal respiratory effort. No intercostal retractions LUNGS: Clear to auscultation bilaterally with rales noted at the bases.  No stridor. No  wheezes. CARDIOVASCULAR: Regular rate and rhythm, positive Q000111Q, soft holosystolic murmur heard at the apex, no gallops or rubs. ABDOMINAL: Obese, soft, nontender, nondistended, positive bowel sounds in all 4 quadrants, no apparent ascites. EXTREMITIES: +1 bilateral pitting edema.  No peripheral edema HEMATOLOGIC: No significant bruising NEUROLOGIC: Oriented to person, place, and time. Nonfocal. Normal muscle tone. PSYCHIATRIC: Normal mood and affect. Normal behavior. Cooperative  Lab Results: Hematology Recent Labs  Lab 04/07/21 1251 04/08/21 0232  WBC 10.0 8.1  RBC 3.72* 3.81*  HGB 11.6* 11.8*  HCT 34.7* 34.9*  MCV 93.3 91.6  MCH 31.2 31.0  MCHC 33.4 33.8  RDW 17.6* 17.3*  PLT 195 188    Chemistry Recent Labs  Lab 04/06/21 0726 04/07/21 1251 04/08/21 0232  NA 146* 140 140  K 3.6 3.6 3.6  CL 109* 106 103  CO2 '25 28 26  '$ GLUCOSE 124* 145* 162*  BUN '16 15 17  '$ CREATININE 1.53* 1.63* 1.63*  CALCIUM 8.7 8.9 9.2  GFRNONAA  --  47* 47*  ANIONGAP  --  6 11     Cardiac Enzymes: Cardiac Panel (last 3 results) Recent Labs    04/07/21 1251 04/07/21 1457  TROPONINIHS 17 13    BNP (last 3 results) Recent Labs    04/01/21 2303 04/07/21 1251 04/09/21 0147  BNP 2,627.5* 3,953.6* 1,645.7*    ProBNP (last 3 results) Recent Labs    03/06/21 0712 03/30/21 0707 04/06/21 0725  PROBNP 1,171* 2,643* [id number]*     DDimer No results for input(s): DDIMER in the last 168 hours.  Hemoglobin A1c:  Lab Results  Component Value Date   HGBA1C 5.7 (H) 04/08/2021   MPG 116.89 04/08/2021    TSH  Recent Labs    11/19/20 0233  TSH 1.247    Lipid Panel     Component Value Date/Time   CHOL 111 04/09/2021 0147   TRIG 53 04/09/2021 0147   HDL 62 04/09/2021 0147   CHOLHDL 1.8 04/09/2021 0147   VLDL 11 04/09/2021 0147   LDLCALC 38 04/09/2021 0147    Imaging: No results found.  CARDIAC DATABASE: EKG: 04/07/2021: Normal sinus rhythm 73 bpm, nonspecific T wave  abnormality, without underlying injury pattern   Echocardiogram: 03/23/2021: LVEF 60 to 65%, mild LVH, grade 1 diastolic impairment, moderately elevated PASP 49 mmHg, severely dilated left atrium, mild MR, mild AR.   Stress Testing: No results found for this or any previous visit from the past 1095 days.   Heart Catheterization: None  IMPRESSION & RECOMMENDATIONS: [name] is a 65 y.o. male whose past medical history and cardiac risk factors include: Heart failure with preserved EF, sarcoidosis, HTN, former smoker, Hx of PE (was on Xarelto for 1 year), OSA not on CPAP, MSSA Bacteremia (March 2022), Hx of cardiac arrest (2002 during his hospitalization for pancreatitis per wife).  Acute on chronic exacerbation of HFpEF, stage C, NYHA class II/III: Most likely exacerbated by salt and fluid indiscretion. Tolerated Farxiga 10 mg p.o. daily - will d/c him home on it. Hold Jardiance.  Transition to oral bumex 1 mg po qday.  Continue diltiazem, BiDil, labetalol. Strict I's and O's and daily weights BNP this am has improved since admission.  Net Negative 2.4L Surface ECG nonischemic. High-sensitivity negative x2 Recently had an echocardiogram during prior hospitalization in July 2022. Patient is in the process of being reevaluated for sleep apnea. May discharge him from cards standpoint. Patient is encouraged to take meds as directed, LOW SODIUM diet, Limit fluid intake to 1500cc per day.    Hypertensive urgency with chronic kidney disease stage III: Present on admission. Improving. Continue to further uptitrate GDMT as hemodynamics and kidney function allows. Educated on the importance of low-salt diet.   History of OSA: Patient is encouraged to follow-up with sleep medicine to be reevaluated for sleep apnea.   History of pulmonary embolism: Currently on oral anticoagulation  Patient's questions and concerns were addressed to his satisfaction. He voices understanding of the  instructions provided during this encounter.   This note was created using a voice recognition software as a result there may be grammatical errors inadvertently enclosed that do not reflect the nature of this encounter. Every attempt is made to correct such errors.  Rex Kras, DO, [facility]. [place] Office: [phone] 04/09/2021, 10:12 PM

## 2021-04-09 NOTE — TOC Initial Note (Signed)
Transition of Care [facility]) - Initial/Assessment Note    Patient Details  Name: [name] MRN: [id number] Date of Birth: 06/24/1956  Transition of Care [facility]) CM/SW Contact:    Zenon Mayo, RN Phone Number: 04/09/2021, 9:49 AM  Clinical Narrative:                 Patient is for dc today, he states his neighbor will be transporting him home, he has no issues with his medications.  He does not have any DME needs.    Expected Discharge Plan: Home/Self Care Barriers to Discharge: No Barriers Identified   Patient Goals and CMS Choice Patient states their goals for this hospitalization and ongoing recovery are:: to return home      Expected Discharge Plan and Services Expected Discharge Plan: Home/Self Care   Discharge Planning Services: CM Consult Post Acute Care Choice: NA Living arrangements for the past 2 months: Single Family Home Expected Discharge Date: 04/09/21                 DME Agency: NA       HH Arranged: NA          Prior Living Arrangements/Services Living arrangements for the past 2 months: Single Family Home Lives with:: Spouse Patient language and need for interpreter reviewed:: Yes Do you feel safe going back to the place where you live?: Yes      Need for Family Participation in Patient Care: No (Comment) Care giver support system in place?: Yes (comment)   Criminal Activity/Legal Involvement Pertinent to Current Situation/Hospitalization: No - Comment as needed  Activities of Daily Living Home Assistive Devices/Equipment: None ADL Screening (condition at time of admission) Patient's cognitive ability adequate to safely complete daily activities?: Yes Is the patient deaf or have difficulty hearing?: No Does the patient have difficulty seeing, even when wearing glasses/contacts?: No Does the patient have difficulty concentrating, remembering, or making decisions?: No Patient able to express need for assistance with ADLs?: No Does the  patient have difficulty dressing or bathing?: No Independently performs ADLs?: Yes (appropriate for developmental age) Does the patient have difficulty walking or climbing stairs?: No Weakness of Legs: None Weakness of Arms/Hands: None  Permission Sought/Granted                  Emotional Assessment   Attitude/Demeanor/Rapport: Engaged Affect (typically observed): Appropriate Orientation: : Oriented to Self, Oriented to Place, Oriented to  Time, Oriented to Situation Alcohol / Substance Use: Not Applicable Psych Involvement: No (comment)  Admission diagnosis:  Acute on chronic diastolic heart failure ([place]) [I50.33] Patient Active Problem List   Diagnosis Date Noted   Acute on chronic diastolic heart failure ([place]) 04/07/2021   Acute hypoxemic respiratory failure ([place]) 04/07/2021   Acute on chronic diastolic CHF (congestive heart failure) ([place]) 03/22/2021   Encephalopathy acute 12/22/2020   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   Chronic intermittent hypoxia with obstructive sleep apnea 12/22/2020   Mild cognitive impairment 12/22/2020   Acute combined systolic and diastolic congestive heart failure ([place]) 12/22/2020   Rotator cuff arthropathy, right 12/04/2020   Volume overload 12/04/2020   Shortness of breath 12/04/2020   ARF (acute renal failure) ([place]) 11/19/2020   Acute respiratory failure ([place]) 11/19/2020   Bacteremia due to methicillin susceptible Staphylococcus aureus (MSSA) 11/19/2020   Cranial neuropathy    Acute encephalopathy 11/18/2020   Chronic right shoulder pain 06/18/2020   OSA (obstructive sleep apnea) 05/05/2020   Hypersomnia with sleep apnea  12/20/2019   Psychophysiological insomnia 09/18/2019   Nocturia more than twice per night 09/18/2019   Renal hypertension 09/18/2019   Moderate asthma 09/18/2019   Tobacco abuse 09/18/2019   Non-restorative sleep 09/18/2019   Nontraumatic complete tear of right rotator cuff 08/15/2019    Delirium 03/16/2019   Stage 3b chronic kidney disease    CVA (cerebral infarction)    Diastolic dysfunction    Pseudarthrosis 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 02/28/2019   Intervertebral disc disorder with radiculopathy of lumbar region 09/13/2017   Acute gouty arthritis 03/07/2017   Arthralgia of left foot 03/07/2017   Nephrolithiasis    Fever, unspecified 11/02/2015   Essential hypertension 02/07/2015   Pulmonary embolism ([place]) 01/30/2013   Sarcoidosis 01/30/2013   Hemoptysis 01/30/2013   SHOULDER PAIN, LEFT 11/28/2009   PCP:  [name], MD Pharmacy:   [id number] [place] [address] [place] [address]Phone: [phone]: [phone] [facility] [address]Phone: [phone]: [phone]    Social Determinants of Health (SDOH) Interventions    Readmission Risk Interventions Readmission Risk Prevention Plan 04/09/2021  Transportation Screening Complete  PCP or Specialist Appt within 3-5 Days Complete  [place]  Social Work [place]sel[place] Complete  Palliative Care Screening Not Applicable  Medication Review (Press photographer Complete  Some recent data might be hidden

## 2021-04-09 NOTE — TOC Transition Note (Signed)
Transition of Care [facility]) - CM/SW Discharge Note   Patient Details  Name: [name] MRN: [id number] Date of Birth: [birth year]  Transition [facility]) CM/SW Contact:  Zenon Mayo, RN Phone Number: 04/09/2021, 9:51 AM   Clinical Narrative:    Patient is for dc today, he states his neighbor will be transporting him home, he has no issues with his medications.  He does not have any DME needs.      Final next level of care: Home/Self Care Barriers to Discharge: No Barriers Identified   Patient Goals and CMS Choice Patient states their goals for this hospitalization and ongoing recovery are:: to return home      Discharge Placement                       Discharge Plan and Services   Discharge Planning Services: CM Consult Post Acute Care Choice: NA            DME Agency: NA       HH Arranged: NA          Social Determinants of Health (SDOH) Interventions     Readmission Risk Interventions Readmission Risk Prevention Plan 04/09/2021  Transportation Screening Complete  PCP or Specialist Appt within 3-5 Days Complete  HRI or [place] Complete  Social Work Consult for [employer] Planning/Counseling Complete  Palliative Care Screening Not Applicable  Medication Review Press photographer) Complete  Some recent data might be hidden

## 2021-04-09 NOTE — Discharge Summary (Signed)
Physician Discharge Summary  [name] [id number] DOB: 1956/01/30 DOA: 04/07/2021  PCP: [name], MD  Admit date: 04/07/2021 Discharge date: 04/09/2021  Discharge disposition: Home   Recommendations for Outpatient Follow-Up:   Follow-up with Dr. Rex Kras, cardiologist, on 04/17/2021.   Discharge Diagnosis:   Principal Problem:   Acute on chronic diastolic heart failure (HCC) Active Problems:   Sarcoidosis   Essential hypertension   Stage 3b chronic kidney disease   OSA (obstructive sleep apnea)   Acute hypoxemic respiratory failure (Garza-Sal[place] II)    Discharge Condition: Stable.  Diet recommendation:  Diet Order             Diet - low sodium heart healthy           Diet Heart Room service appropriate? Yes; Fluid consistency: Thin  Diet effective now                     Code Status: Full Code     Hospital Course:   [name] is a 65 year old man with medical history significant for 7 hospitalization from 03/22/2021 to 03/24/2021 for CHF exacerbation, CKD stage IIIb, stroke, chronic diastolic CHF, hyperlipidemia, hypertension, sarcoidosis, obesity, who presented to the hospital because of worsening shortness of breath, orthopnea and cough. Oxygen saturation was 87% on room air.  He was admitted to the hospital for acute exacerbation of chronic diastolic CHF complicated by acute hypoxic respiratory failure.  He was treated with IV Bumex.  He was seen in consultation by both cardiologist, Dr. Terri Skains.  His condition has improved and history of stable for discharge to home today.     Medical Consultants:   Cardiologist   Discharge Exam:    Vitals:   04/08/21 1950 04/09/21 0009 04/09/21 0430 04/09/21 0729  BP: (!) 150/85 129/73 (!) 142/86 (!) 148/93  Pulse: 62 (!) 58 61 63  Resp: (!) '22 20 18 19  '$ Temp: 98.3 F (36.8 C) 98 F (36.7 C) 97.7 F (36.5 C) 98.3 F (36.8 C)  TempSrc: Oral Oral Oral Oral  SpO2: 94% 93% 94% 94%  Weight:  107.4 kg     Height:         GEN: NAD SKIN: No rash EYES: EOMI ENT: MMM CV: RRR PULM: CTA B ABD: soft, obese, NT, +BS CNS: AAO x 3, non focal EXT: Trace b/l leg edema, no tenderness   The results of significant diagnostics from this hospitalization (including imaging, microbiology, ancillary and laboratory) are listed below for reference.     Procedures and Diagnostic Studies:   DG Chest 2 View  Result Date: 04/07/2021 CLINICAL DATA:  Shortness of breath.  Leg swelling.  Fatigue. EXAM: CHEST - 2 VIEW COMPARISON:  04/01/2021. FINDINGS: Cardiomegaly venous congestion and bilateral interstitial prominence again noted. Right base and right upper lobe alveolar infiltrates. Progressive right pleural effusion. No pneumothorax. Degenerative changes scoliosis thoracic spine. IMPRESSION: 1. Cardiomegaly with pulmonary venous congestion, bilateral interstitial prominence, and progressive right pleural effusion. Findings consistent with CHF. 2. Right base and right upper lobe alveolar infiltrates. This could represent asymmetric pulmonary edema, pneumonia cannot be excluded. Electronically Signed   By: Marcello Moores  Register   On: 04/07/2021 13:31     Labs:   Basic Metabolic Panel: Recent Labs  Lab 04/06/21 0726 04/07/21 1251 04/08/21 0232  NA 146* 140 140  K 3.6 3.6 3.6  CL 109* 106 103  CO2 '25 28 26  '$ GLUCOSE 124* 145* 162*  BUN 16 15 17  CREATININE 1.53* 1.63* 1.63*  CALCIUM 8.7 8.9 9.2  MG 2.0  --   --    GFR Estimated Creatinine Clearance: 58 mL/min (A) (by C-G formula based on SCr of 1.63 mg/dL (H)). Liver Function Tests: No results for input(s): AST, ALT, ALKPHOS, BILITOT, PROT, ALBUMIN in the last 168 hours. No results for input(s): LIPASE, AMYLASE in the last 168 hours. No results for input(s): AMMONIA in the last 168 hours. Coagulation profile No results for input(s): INR, PROTIME in the last 168 hours.  CBC: Recent Labs  Lab 04/07/21 1251 04/08/21 0232  WBC 10.0 8.1   NEUTROABS 8.8*  --   HGB 11.6* 11.8*  HCT 34.7* 34.9*  MCV 93.3 91.6  PLT 195 188   Cardiac Enzymes: No results for input(s): CKTOTAL, CKMB, CKMBINDEX, TROPONINI in the last 168 hours. BNP: Invalid input(s): POCBNP CBG: No results for input(s): GLUCAP in the last 168 hours. D-Dimer No results for input(s): DDIMER in the last 72 hours. Hgb A1c Recent Labs    04/08/21 0232  HGBA1C 5.7*   Lipid Profile Recent Labs    04/09/21 0147  CHOL 111  HDL 62  LDLCALC 38  TRIG 53  CHOLHDL 1.8   Thyroid function studies No results for input(s): TSH, T4TOTAL, T3FREE, THYROIDAB in the last 72 hours.  Invalid input(s): FREET3 Anemia work up No results for input(s): VITAMINB12, FOLATE, FERRITIN, TIBC, IRON, RETICCTPCT in the last 72 hours. Microbiology Recent Results (from the past 240 hour(s))  SARS CORONAVIRUS 2 (TAT 6-24 HRS) Nasopharyngeal Nasopharyngeal Swab     Status: None   Collection Time: 04/01/21 10:56 PM   Specimen: Nasopharyngeal Swab  Result Value Ref Range Status   SARS Coronavirus 2 NEGATIVE NEGATIVE Final    Comment: (NOTE) SARS-CoV-2 target nucleic acids are NOT DETECTED.  The SARS-CoV-2 RNA is generally detectable in upper and lower respiratory specimens during the acute phase of infection. Negative results do not preclude SARS-CoV-2 infection, do not rule out co-infections with other pathogens, and should not be used as the sole basis for treatment or other patient management decisions. Negative results must be combined with clinical observations, patient history, and epidemiological information. The expected result is Negative.  Fact Sheet for Patients: SugarRoll.be  Fact Sheet for Healthcare Providers: https://www.woods-mathews.com/  This test is not yet approved or cleared by the [place] FDA and  has been authorized for detection and/or diagnosis of SARS-CoV-2 by FDA under an Emergency Use Authorization  (EUA). This EUA will remain  in effect (meaning this test can be used) for the duration of the COVID-19 declaration under Se ction 564(b)(1) of the Act, 21 U.S.C. section 360bbb-3(b)(1), unless the authorization is terminated or revoked sooner.  Performed at [facility], [place] [address], [place], [place] [address]   Resp Panel by RT-PCR (Flu A&B, Covid) Nasopharyngeal Swab     Status: None   Collection Time: 04/07/21 12:51 PM   Specimen: Nasopharyngeal Swab; Nasopharyngeal(NP) swabs in vial transport medium  Result Value Ref Range Status   SARS Coronavirus 2 by RT PCR NEGATIVE NEGATIVE Final    Comment: (NOTE) SARS-CoV-2 target nucleic acids are NOT DETECTED.  The SARS-CoV-2 RNA is generally detectable in upper respiratory specimens during the acute phase of infection. The lowest concentration of SARS-CoV-2 viral copies this assay can detect is 138 copies/mL. A negative result does not preclude SARS-Cov-2 infection and should not be used as the sole basis for treatment or other patient management decisions. A negative result may  occur with  improper specimen collection/handling, submission of specimen other than nasopharyngeal swab, presence of viral mutation(s) within the areas targeted by this assay, and inadequate number of viral copies(<138 copies/mL). A negative result must be combined with clinical observations, patient history, and epidemiological information. The expected result is Negative.  Fact Sheet for Patients:  EntrepreneurPulse.com.au  Fact Sheet for Healthcare Providers:  IncredibleEmployment.be  This test is no t yet approved or cleared by the [place] FDA and  has been authorized for detection and/or diagnosis of SARS-CoV-2 by FDA under an Emergency Use Authorization (EUA). This EUA will remain  in effect (meaning this test can be used) for the duration of the COVID-19 declaration under Section 564(b)(1) of the  Act, 21 U.S.C.section 360bbb-3(b)(1), unless the authorization is terminated  or revoked sooner.       Influenza A by PCR NEGATIVE NEGATIVE Final   Influenza B by PCR NEGATIVE NEGATIVE Final    Comment: (NOTE) The Xpert Xpress SARS-CoV-2/FLU/RSV plus assay is intended as an aid in the diagnosis of influenza from Nasopharyngeal swab specimens and should not be used as a sole basis for treatment. Nasal washings and aspirates are unacceptable for Xpert Xpress SARS-CoV-2/FLU/RSV testing.  Fact Sheet for Patients: EntrepreneurPulse.com.au  Fact Sheet for Healthcare Providers: IncredibleEmployment.be  This test is not yet approved or cleared by the [place] FDA and has been authorized for detection and/or diagnosis of SARS-CoV-2 by FDA under an Emergency Use Authorization (EUA). This EUA will remain in effect (meaning this test can be used) for the duration of the COVID-19 declaration under Section 564(b)(1) of the Act, 21 U.S.C. section 360bbb-3(b)(1), unless the authorization is terminated or revoked.  Performed at [facility], [place] [address], [place], [place] [address]      Discharge Instructions:   Discharge Instructions     (HEART FAILURE PATIENTS) Call MD:  Anytime you have any of the following symptoms: 1) 3 pound weight gain in 24 hours or 5 pounds in 1 week 2) shortness of breath, with or without a dry hacking cough 3) swelling in the hands, feet or stomach 4) if you have to sleep on extra pillows at night in order to breathe.   Complete by: As directed    Call MD for:  difficulty breathing, headache or visual disturbances   Complete by: As directed    Call MD for:  extreme fatigue   Complete by: As directed    Call MD for:  persistant dizziness or light-headedness   Complete by: As directed    Diet - low sodium heart healthy   Complete by: As directed    Heart Failure patients record your daily weight using the same  scale at the same time of day   Complete by: As directed    Increase activity slowly   Complete by: As directed    Increase activity slowly   Complete by: As directed    STOP any activity that causes chest pain, shortness of breath, dizziness, sweating, or exessive weakness   Complete by: As directed       Allergies as of 04/09/2021       Reactions   Codeine Nausea Only   Hydromorphone Other (See Comments)   hallucinations         Medication List     STOP taking these medications    empagliflozin 10 MG Tabs tablet Commonly known as: JARDIANCE       TAKE these medications    ALKA-SELTZER  PLUS COLD PO Take 2 tablets by mouth every 6 (six) hours as needed (cold symptoms).   aspirin 81 MG tablet Take 1 tablet (81 mg total) by mouth daily.   atorvastatin 20 MG tablet Commonly known as: LIPITOR Take 20 mg by mouth daily.   BLACK ELDERBERRY PO Take 2 each by mouth daily as needed (immune support). Gummies   bumetanide 1 MG tablet Commonly known as: BUMEX Take 1 tablet (1 mg total) by mouth daily.   dapagliflozin propanediol 10 MG Tabs tablet Commonly known as: FARXIGA Take 1 tablet (10 mg total) by mouth daily. Start taking on: April 10, 2021   diltiazem 360 MG 24 hr capsule Commonly known as: CARDIZEM CD Take 360 mg by mouth daily.   diphenhydrAMINE 25 mg capsule Commonly known as: BENADRYL Take 25 mg by mouth every 6 (six) hours as needed.   docusate sodium 100 MG capsule Commonly known as: COLACE Take 1 capsule (100 mg total) by mouth 2 (two) times daily.   Entresto 49-51 MG Generic drug: sacubitril-valsartan Take 1 tablet by mouth 2 (two) times daily.   fluticasone 50 MCG/ACT nasal spray Commonly known as: FLONASE Place 2 sprays into both nostrils daily as needed for allergies or rhinitis.   isosorbide-hydrALAZINE 20-37.5 MG tablet Commonly known as: BIDIL Take 1 tablet by mouth 3 (three) times daily.   labetalol 300 MG tablet Commonly known  as: NORMODYNE Take 300 mg by mouth 3 (three) times daily.   predniSONE 5 MG tablet Commonly known as: DELTASONE Take 5 mg by mouth daily. Reported on 11/02/2015   tamsulosin 0.4 MG Caps capsule Commonly known as: FLOMAX Take 0.4 mg by mouth daily.   traZODone 150 MG tablet Commonly known as: DESYREL Take 1 tablet (150 mg total) by mouth at bedtime as needed for sleep.        Follow-up Information     Tolia, Sunit, DO. Go on 04/17/2021.   Specialties: Cardiology, Vascular Surgery Contact information: [place] [phone]                  Time coordinating discharge: 31 minutes  Signed:  Jennye Boroughs  [facility] 04/09/2021, 9:35 AM   Pager on [email or web address]. If 7PM-7AM, please contact night-coverage at [email or web address]

## 2021-04-10 ENCOUNTER — Ambulatory Visit: Payer: PPO | Admitting: Cardiology

## 2021-04-10 DIAGNOSIS — R0600 Dyspnea, unspecified: Secondary | ICD-10-CM

## 2021-04-10 DIAGNOSIS — H04553 Acquired stenosis of bilateral nasolacrimal duct: Secondary | ICD-10-CM | POA: Diagnosis not present

## 2021-04-10 DIAGNOSIS — I5031 Acute diastolic (congestive) heart failure: Secondary | ICD-10-CM

## 2021-04-10 DIAGNOSIS — N179 Acute kidney failure, unspecified: Secondary | ICD-10-CM

## 2021-04-10 DIAGNOSIS — E782 Mixed hyperlipidemia: Secondary | ICD-10-CM

## 2021-04-10 DIAGNOSIS — I10 Essential (primary) hypertension: Secondary | ICD-10-CM

## 2021-04-10 DIAGNOSIS — Z86711 Personal history of pulmonary embolism: Secondary | ICD-10-CM

## 2021-04-10 DIAGNOSIS — Z720 Tobacco use: Secondary | ICD-10-CM

## 2021-04-10 DIAGNOSIS — G4733 Obstructive sleep apnea (adult) (pediatric): Secondary | ICD-10-CM

## 2021-04-10 DIAGNOSIS — H04552 Acquired stenosis of left nasolacrimal duct: Secondary | ICD-10-CM | POA: Diagnosis not present

## 2021-04-10 DIAGNOSIS — H04551 Acquired stenosis of right nasolacrimal duct: Secondary | ICD-10-CM | POA: Diagnosis not present

## 2021-04-14 DIAGNOSIS — I5031 Acute diastolic (congestive) heart failure: Secondary | ICD-10-CM | POA: Diagnosis not present

## 2021-04-15 LAB — PRO B NATRIURETIC PEPTIDE: NT-Pro BNP: 3473 pg/mL — ABNORMAL HIGH (ref 0–210)

## 2021-04-15 LAB — MAGNESIUM: Magnesium: 2.1 mg/dL (ref 1.6–2.3)

## 2021-04-15 NOTE — Progress Notes (Signed)
I called [facility] they will fax [place] a form either if it can be done or not

## 2021-04-17 ENCOUNTER — Inpatient Hospital Stay (HOSPITAL_COMMUNITY)
Admission: EM | Admit: 2021-04-17 | Discharge: 2021-04-23 | DRG: 291 | Disposition: A | Discharge status: Home / Self Care | Payer: PPO | Attending: Internal Medicine | Admitting: Internal Medicine

## 2021-04-17 ENCOUNTER — Other Ambulatory Visit: Payer: Self-pay

## 2021-04-17 ENCOUNTER — Ambulatory Visit: Payer: PPO | Admitting: Cardiology

## 2021-04-17 ENCOUNTER — Encounter (HOSPITAL_COMMUNITY): Payer: Self-pay | Admitting: *Deleted

## 2021-04-17 ENCOUNTER — Encounter: Payer: Self-pay | Admitting: Cardiology

## 2021-04-17 ENCOUNTER — Emergency Department (HOSPITAL_COMMUNITY): Payer: PPO

## 2021-04-17 VITALS — BP 138/78 | HR 67 | Temp 98.8°F | Resp 16 | Ht 72.0 in | Wt 222.0 lb

## 2021-04-17 DIAGNOSIS — Z86711 Personal history of pulmonary embolism: Secondary | ICD-10-CM

## 2021-04-17 DIAGNOSIS — R0602 Shortness of breath: Secondary | ICD-10-CM | POA: Diagnosis not present

## 2021-04-17 DIAGNOSIS — G4733 Obstructive sleep apnea (adult) (pediatric): Secondary | ICD-10-CM | POA: Diagnosis present

## 2021-04-17 DIAGNOSIS — Z720 Tobacco use: Secondary | ICD-10-CM | POA: Diagnosis present

## 2021-04-17 DIAGNOSIS — J069 Acute upper respiratory infection, unspecified: Secondary | ICD-10-CM | POA: Diagnosis present

## 2021-04-17 DIAGNOSIS — Z823 Family history of stroke: Secondary | ICD-10-CM

## 2021-04-17 DIAGNOSIS — I5033 Acute on chronic diastolic (congestive) heart failure: Secondary | ICD-10-CM | POA: Diagnosis present

## 2021-04-17 DIAGNOSIS — Z7982 Long term (current) use of aspirin: Secondary | ICD-10-CM

## 2021-04-17 DIAGNOSIS — Z79899 Other long term (current) drug therapy: Secondary | ICD-10-CM

## 2021-04-17 DIAGNOSIS — I517 Cardiomegaly: Secondary | ICD-10-CM | POA: Diagnosis not present

## 2021-04-17 DIAGNOSIS — Z8249 Family history of ischemic heart disease and other diseases of the circulatory system: Secondary | ICD-10-CM

## 2021-04-17 DIAGNOSIS — N1832 Chronic kidney disease, stage 3b: Secondary | ICD-10-CM | POA: Diagnosis not present

## 2021-04-17 DIAGNOSIS — J449 Chronic obstructive pulmonary disease, unspecified: Secondary | ICD-10-CM | POA: Diagnosis not present

## 2021-04-17 DIAGNOSIS — Z8673 Personal history of transient ischemic attack (TIA), and cerebral infarction without residual deficits: Secondary | ICD-10-CM

## 2021-04-17 DIAGNOSIS — R911 Solitary pulmonary nodule: Secondary | ICD-10-CM | POA: Diagnosis not present

## 2021-04-17 DIAGNOSIS — J441 Chronic obstructive pulmonary disease with (acute) exacerbation: Secondary | ICD-10-CM | POA: Diagnosis present

## 2021-04-17 DIAGNOSIS — J811 Chronic pulmonary edema: Secondary | ICD-10-CM | POA: Diagnosis not present

## 2021-04-17 DIAGNOSIS — I130 Hypertensive heart and chronic kidney disease with heart failure and stage 1 through stage 4 chronic kidney disease, or unspecified chronic kidney disease: Principal | ICD-10-CM | POA: Diagnosis present

## 2021-04-17 DIAGNOSIS — I77810 Thoracic aortic ectasia: Secondary | ICD-10-CM | POA: Diagnosis not present

## 2021-04-17 DIAGNOSIS — J9601 Acute respiratory failure with hypoxia: Secondary | ICD-10-CM | POA: Diagnosis present

## 2021-04-17 DIAGNOSIS — E782 Mixed hyperlipidemia: Secondary | ICD-10-CM

## 2021-04-17 DIAGNOSIS — R0600 Dyspnea, unspecified: Secondary | ICD-10-CM

## 2021-04-17 DIAGNOSIS — I10 Essential (primary) hypertension: Secondary | ICD-10-CM | POA: Diagnosis present

## 2021-04-17 DIAGNOSIS — Z801 Family history of malignant neoplasm of trachea, bronchus and lung: Secondary | ICD-10-CM

## 2021-04-17 DIAGNOSIS — Z87891 Personal history of nicotine dependence: Secondary | ICD-10-CM | POA: Diagnosis not present

## 2021-04-17 DIAGNOSIS — I5031 Acute diastolic (congestive) heart failure: Secondary | ICD-10-CM

## 2021-04-17 DIAGNOSIS — D869 Sarcoidosis, unspecified: Secondary | ICD-10-CM | POA: Diagnosis present

## 2021-04-17 DIAGNOSIS — I2510 Atherosclerotic heart disease of native coronary artery without angina pectoris: Secondary | ICD-10-CM | POA: Diagnosis not present

## 2021-04-17 DIAGNOSIS — E875 Hyperkalemia: Secondary | ICD-10-CM | POA: Diagnosis not present

## 2021-04-17 DIAGNOSIS — Z6831 Body mass index (BMI) 31.0-31.9, adult: Secondary | ICD-10-CM

## 2021-04-17 DIAGNOSIS — R0609 Other forms of dyspnea: Secondary | ICD-10-CM | POA: Diagnosis not present

## 2021-04-17 DIAGNOSIS — N179 Acute kidney failure, unspecified: Secondary | ICD-10-CM

## 2021-04-17 DIAGNOSIS — Z820 Family history of epilepsy and other diseases of the nervous system: Secondary | ICD-10-CM

## 2021-04-17 DIAGNOSIS — R9431 Abnormal electrocardiogram [ECG] [EKG]: Secondary | ICD-10-CM

## 2021-04-17 DIAGNOSIS — Z87442 Personal history of urinary calculi: Secondary | ICD-10-CM

## 2021-04-17 DIAGNOSIS — K219 Gastro-esophageal reflux disease without esophagitis: Secondary | ICD-10-CM | POA: Diagnosis present

## 2021-04-17 DIAGNOSIS — I110 Hypertensive heart disease with heart failure: Secondary | ICD-10-CM | POA: Diagnosis not present

## 2021-04-17 DIAGNOSIS — I509 Heart failure, unspecified: Secondary | ICD-10-CM

## 2021-04-17 DIAGNOSIS — D860 Sarcoidosis of lung: Secondary | ICD-10-CM | POA: Diagnosis not present

## 2021-04-17 DIAGNOSIS — I700 Atherosclerosis of aorta: Secondary | ICD-10-CM | POA: Diagnosis not present

## 2021-04-17 DIAGNOSIS — Z8674 Personal history of sudden cardiac arrest: Secondary | ICD-10-CM

## 2021-04-17 DIAGNOSIS — E785 Hyperlipidemia, unspecified: Secondary | ICD-10-CM | POA: Diagnosis present

## 2021-04-17 DIAGNOSIS — Z20822 Contact with and (suspected) exposure to covid-19: Secondary | ICD-10-CM | POA: Diagnosis not present

## 2021-04-17 DIAGNOSIS — J90 Pleural effusion, not elsewhere classified: Secondary | ICD-10-CM | POA: Diagnosis not present

## 2021-04-17 DIAGNOSIS — Z7952 Long term (current) use of systemic steroids: Secondary | ICD-10-CM

## 2021-04-17 DIAGNOSIS — Z885 Allergy status to narcotic agent status: Secondary | ICD-10-CM

## 2021-04-17 LAB — COMPREHENSIVE METABOLIC PANEL
ALT: 25 U/L (ref 0–44)
AST: 27 U/L (ref 15–41)
Albumin: 3.2 g/dL — ABNORMAL LOW (ref 3.5–5.0)
Alkaline Phosphatase: 83 U/L (ref 38–126)
Anion gap: 7 (ref 5–15)
BUN: 12 mg/dL (ref 8–23)
CO2: 31 mmol/L (ref 22–32)
Calcium: 9 mg/dL (ref 8.9–10.3)
Chloride: 99 mmol/L (ref 98–111)
Creatinine, Ser: 1.78 mg/dL — ABNORMAL HIGH (ref 0.61–1.24)
GFR, Estimated: 42 mL/min — ABNORMAL LOW (ref >60)
Glucose, Bld: 120 mg/dL — ABNORMAL HIGH (ref 70–99)
Potassium: 3.4 mmol/L — ABNORMAL LOW (ref 3.5–5.1)
Sodium: 137 mmol/L (ref 135–145)
Total Bilirubin: 0.5 mg/dL (ref 0.3–1.2)
Total Protein: 6.6 g/dL (ref 6.5–8.1)

## 2021-04-17 LAB — CBC WITH DIFFERENTIAL/PLATELET
Abs Immature Granulocytes: 0.03 10*3/uL (ref 0.00–0.07)
Basophils Absolute: 0 10*3/uL (ref 0.0–0.1)
Basophils Relative: 0 %
Eosinophils Absolute: 0.4 10*3/uL (ref 0.0–0.5)
Eosinophils Relative: 6 %
HCT: 38.6 % — ABNORMAL LOW (ref 39.0–52.0)
Hemoglobin: 12.4 g/dL — ABNORMAL LOW (ref 13.0–17.0)
Immature Granulocytes: 0 %
Lymphocytes Relative: 8 %
Lymphs Abs: 0.5 10*3/uL — ABNORMAL LOW (ref 0.7–4.0)
MCH: 29.8 pg (ref 26.0–34.0)
MCHC: 32.1 g/dL (ref 30.0–36.0)
MCV: 92.8 fL (ref 80.0–100.0)
Monocytes Absolute: 0.5 10*3/uL (ref 0.1–1.0)
Monocytes Relative: 8 %
Neutro Abs: 5.3 10*3/uL (ref 1.7–7.7)
Neutrophils Relative %: 78 %
Platelets: 246 10*3/uL (ref 150–400)
RBC: 4.16 MIL/uL — ABNORMAL LOW (ref 4.22–5.81)
RDW: 16 % — ABNORMAL HIGH (ref 11.5–15.5)
WBC: 6.8 10*3/uL (ref 4.0–10.5)
nRBC: 0 % (ref 0.0–0.2)

## 2021-04-17 LAB — BASIC METABOLIC PANEL
BUN/Creatinine Ratio: 8 — ABNORMAL LOW (ref 10–24)
BUN: 14 mg/dL (ref 8–27)
CO2: 26 mmol/L (ref 20–29)
Calcium: 9.4 mg/dL (ref 8.6–10.2)
Chloride: 103 mmol/L (ref 96–106)
Creatinine, Ser: 1.79 mg/dL — ABNORMAL HIGH (ref 0.76–1.27)
Glucose: 107 mg/dL — ABNORMAL HIGH (ref 65–99)
Potassium: 3.9 mmol/L (ref 3.5–5.2)
Sodium: 145 mmol/L — ABNORMAL HIGH (ref 134–144)
eGFR: 42 mL/min/{1.73_m2} — ABNORMAL LOW (ref >59)

## 2021-04-17 LAB — RESP PANEL BY RT-PCR (FLU A&B, COVID) ARPGX2
Influenza A by PCR: NEGATIVE
Influenza B by PCR: NEGATIVE
SARS Coronavirus 2 by RT PCR: NEGATIVE

## 2021-04-17 LAB — SPECIMEN STATUS REPORT

## 2021-04-17 LAB — TROPONIN I (HIGH SENSITIVITY)
Troponin I (High Sensitivity): 12 ng/L (ref <18)
Troponin I (High Sensitivity): 12 ng/L (ref <18)

## 2021-04-17 LAB — D-DIMER, QUANTITATIVE: D-Dimer, Quant: 0.82 ug/mL-FEU — ABNORMAL HIGH (ref 0.00–0.50)

## 2021-04-17 LAB — BRAIN NATRIURETIC PEPTIDE: B Natriuretic Peptide: 2729 pg/mL — ABNORMAL HIGH (ref 0.0–100.0)

## 2021-04-17 MED ORDER — ENTRESTO 49-51 MG PO TABS: 1.0000 | ORAL_TABLET | Freq: Two times a day (BID) | ORAL | 0 refills | Status: DC

## 2021-04-17 MED ORDER — ACETAMINOPHEN 325 MG PO TABS: 650.0000 mg | ORAL_TABLET | Freq: Four times a day (QID) | ORAL | Status: DC | PRN

## 2021-04-17 MED ORDER — ACETAMINOPHEN 650 MG RE SUPP: 650.0000 mg | Freq: Four times a day (QID) | RECTAL | Status: DC | PRN

## 2021-04-17 MED ORDER — LABETALOL HCL 200 MG PO TABS
300.0000 mg | ORAL_TABLET | Freq: Three times a day (TID) | ORAL | Status: DC
  Administered 2021-04-17 – 2021-04-21 (×13): 300 mg via ORAL
  Filled 2021-04-17 (×13): qty 2

## 2021-04-17 MED ORDER — SENNOSIDES-DOCUSATE SODIUM 8.6-50 MG PO TABS
1.0000 | ORAL_TABLET | Freq: Every evening | ORAL | Status: DC | PRN
  Administered 2021-04-19 – 2021-04-21 (×2): 1 via ORAL

## 2021-04-17 MED ORDER — ASPIRIN 81 MG PO CHEW
80.0000 mg | CHEWABLE_TABLET | Freq: Every day | ORAL | Status: DC
  Administered 2021-04-18 – 2021-04-23 (×6): 81 mg via ORAL
  Filled 2021-04-17 (×7): qty 1

## 2021-04-17 MED ORDER — DAPAGLIFLOZIN PROPANEDIOL 10 MG PO TABS
10.0000 mg | ORAL_TABLET | Freq: Every day | ORAL | Status: DC
  Administered 2021-04-18 – 2021-04-23 (×6): 10 mg via ORAL
  Filled 2021-04-17 (×6): qty 1

## 2021-04-17 MED ORDER — ISOSORB DINITRATE-HYDRALAZINE 20-37.5 MG PO TABS
1.0000 | ORAL_TABLET | Freq: Three times a day (TID) | ORAL | Status: DC
  Administered 2021-04-17 – 2021-04-22 (×16): 1 via ORAL
  Filled 2021-04-17 (×17): qty 1

## 2021-04-17 MED ORDER — TRAZODONE HCL 50 MG PO TABS
150.0000 mg | ORAL_TABLET | Freq: Every evening | ORAL | Status: DC | PRN
  Administered 2021-04-19 – 2021-04-22 (×4): 150 mg via ORAL
  Filled 2021-04-17 (×4): qty 1

## 2021-04-17 MED ORDER — SODIUM CHLORIDE 0.9% FLUSH: 3.0000 mL | INTRAVENOUS | Status: DC | PRN

## 2021-04-17 MED ORDER — ISOSORB DINITRATE-HYDRALAZINE 20-37.5 MG PO TABS: 1.0000 | ORAL_TABLET | Freq: Three times a day (TID) | ORAL | Status: DC

## 2021-04-17 MED ORDER — DILTIAZEM HCL ER COATED BEADS 180 MG PO CP24
360.0000 mg | ORAL_CAPSULE | Freq: Every day | ORAL | Status: DC
  Administered 2021-04-18 – 2021-04-23 (×6): 360 mg via ORAL
  Filled 2021-04-17 (×6): qty 2

## 2021-04-17 MED ORDER — BUMETANIDE 1 MG PO TABS: 1.0000 mg | ORAL_TABLET | Freq: Two times a day (BID) | ORAL | 0 refills | Status: DC

## 2021-04-17 MED ORDER — SACUBITRIL-VALSARTAN 49-51 MG PO TABS
1.0000 | ORAL_TABLET | Freq: Two times a day (BID) | ORAL | Status: DC
  Administered 2021-04-17: 1 via ORAL
  Filled 2021-04-17 (×2): qty 1

## 2021-04-17 MED ORDER — TAMSULOSIN HCL 0.4 MG PO CAPS
0.4000 mg | ORAL_CAPSULE | Freq: Every day | ORAL | Status: DC
  Administered 2021-04-18 – 2021-04-23 (×6): 0.4 mg via ORAL
  Filled 2021-04-17 (×6): qty 1

## 2021-04-17 MED ORDER — SODIUM CHLORIDE 0.9 % IV SOLN: 250.0000 mL | INTRAVENOUS | Status: DC | PRN

## 2021-04-17 MED ORDER — SODIUM CHLORIDE 0.9% FLUSH
3.0000 mL | Freq: Two times a day (BID) | INTRAVENOUS | Status: DC
  Administered 2021-04-18 – 2021-04-22 (×8): 3 mL via INTRAVENOUS

## 2021-04-17 MED ORDER — BUMETANIDE 0.25 MG/ML IJ SOLN
1.0000 mg | Freq: Two times a day (BID) | INTRAMUSCULAR | Status: DC
  Administered 2021-04-18: 1 mg via INTRAVENOUS
  Filled 2021-04-17 (×2): qty 4

## 2021-04-17 MED ORDER — ENOXAPARIN SODIUM 100 MG/ML IJ SOSY
1.0000 mg/kg | PREFILLED_SYRINGE | Freq: Two times a day (BID) | INTRAMUSCULAR | Status: DC
  Administered 2021-04-17: 100 mg via SUBCUTANEOUS
  Filled 2021-04-17 (×2): qty 1

## 2021-04-17 MED ORDER — NICOTINE 7 MG/24HR TD PT24
7.0000 mg | MEDICATED_PATCH | Freq: Every day | TRANSDERMAL | Status: DC
  Administered 2021-04-18 – 2021-04-23 (×6): 7 mg via TRANSDERMAL
  Filled 2021-04-17 (×6): qty 1

## 2021-04-17 MED ORDER — BUMETANIDE 0.25 MG/ML IJ SOLN
1.0000 mg | Freq: Once | INTRAMUSCULAR | Status: AC
  Administered 2021-04-17: 1 mg via INTRAVENOUS
  Filled 2021-04-17: qty 4

## 2021-04-17 MED ORDER — ATORVASTATIN CALCIUM 10 MG PO TABS
20.0000 mg | ORAL_TABLET | Freq: Every day | ORAL | Status: DC
  Administered 2021-04-18 – 2021-04-23 (×6): 20 mg via ORAL
  Filled 2021-04-17 (×6): qty 2

## 2021-04-17 NOTE — ED Triage Notes (Signed)
The pt is c/o sob today  low sats on arrival here  he was placed on nasal 02 sats 70% on arrival to the treatemnt room   sats on nasal 02 are 95%  visibly sob

## 2021-04-17 NOTE — Progress Notes (Signed)
Pt placed on bipap tolerating well at this time.

## 2021-04-17 NOTE — H&P (Signed)
History and Physical    BRANDEN FILLINGER [id number] DOB: 1956-06-01 DOA: 04/17/2021  PCP: [name], MD   Patient coming from: Home  Chief Complaint: SOB  HPI: [name] is a 65 y.o. male with medical history significant for HFpEF, sarcoidosis, HTN, HLD, hx of CVA, CKD 3B, tobacco use who presents with complaint of SOB.  Recently admitted for CHF exacerbation was discharged on his fourth.  During the hospitalization he was diuresed with IV Bumex which he tolerated well.  He does not use oxygen at home.  He reports that last few days he has felt some nasal congestion but he has not had fevers.  Last night he was unable to sleep as he cannot get comfortable secondary to the congestion he reports.  He developed a cough 2 days ago which became productive of a mild white phlegm.  He saw his cardiologist for follow-up this morning and at that time his oxygen saturation was 88 to 89% on room air which was a little low for him.  After he went home he had worsening shortness of breath.  He notes that any kind of exertion worsens his shortness of breath.  He does not report any chest pain or palpitations.  Reports he has not had any nausea vomiting diarrhea, abdominal pain, urinary symptoms, syncope, numbness or weakness of extremities.  When his wife got home around 230 or 3:00 they called his PCP and described his symptoms and they called out a Z-Pak for him.  He took 1 dose of this at home and a few hours later he was having worsening shortness of breath so came to the hospital for evaluation.  Patient has a history of PE 5 years ago and was on Xarelto for 1 year after that.  He is not on blood thinners at this time.  He has not had any recent travel or prolonged immobilization. Reports he continues to smoke but is quitting.  He is down to less than a third of a pack of cigarettes a day.  Denies alcohol or illicit drug use.  ED Course: In the emergency room patient had hypoxia and was placed on  BiPAP.  He is tolerating the BiPAP well.  He did have elevated blood pressure readings in the emergency room.  Initial troponins were negative at 12 time it was checked.  BNP level was elevated at 2729.  Chest x-ray showed vascular congestion and pulmonary edema.  CBC was unremarkable.  Sodium was 137, potassium 3.4, chloride 99, bicarb 31, creatinine 1.78, BUN 12, LFTs normal.  He was given dose of IV Bumex in the emergency room to initiate diuresis.  D-dimer level has been ordered and is pending.  Hospitalist service been asked to admit for further management  Review of Systems:  General: Denies fever, chills, weight loss, night sweats.  Denies dizziness.  Denies change in appetite HENT: Denies head trauma, headache, denies change in hearing, tinnitus. Denies nasal bleeding.  Denies sore throat, sores in mouth.  Denies difficulty swallowing Eyes: Denies blurry vision, pain in eye, drainage.  Denies discoloration of eyes. Neck: Denies pain.  Denies swelling.  Denies pain with movement. Cardiovascular: Denies chest pain, palpitations.  Reports edema. Reports orthopnea Respiratory: Reports shortness of breath, cough.  Denies wheezing.  Denies sputum production Gastrointestinal: Denies abdominal pain, swelling.  Denies nausea, vomiting, diarrhea.  Denies melena.  Denies hematemesis. Musculoskeletal: Denies limitation of movement.  Denies deformity or swelling.  Denies pain.  Denies arthralgias or myalgias. Genitourinary:  Denies pelvic pain.  Denies urinary frequency or hesitancy.  Denies dysuria.  Skin: Denies rash.  Denies petechiae, purpura, ecchymosis. Neurological: Denies syncope. Denies seizure activity.  Denies paresthesia.  Denies slurred speech, drooping face.  Denies visual change. Psychiatric: Denies depression, anxiety. Denies hallucinations.  Past Medical History:  Diagnosis Date   Back pain    CKD (chronic kidney disease), stage III (HCC)    CVA (cerebral infarction)    Diastolic  dysfunction    ED (erectile dysfunction)    GERD (gastroesophageal reflux disease)    Hypercalcemia    Hyperlipidemia    Hypertension    Insomnia    Long-term use of high-risk medication    Nephrocalcinosis    Nephrolithiasis    Obesity    Osteopenia    Pancreatitis    PE (pulmonary embolism)    Proteinuria    Sarcoidosis    Smoker     Past Surgical History:  Procedure Laterality Date   ABDOMINAL EXPLORATION SURGERY     BACK SURGERY     CHOLECYSTECTOMY     SHOULDER ARTHROSCOPY Right 10/30/2020   Procedure: ARTHROSCOPY SHOULDER WITH EXTENSIVE DEBRIDEMENT;  Surgeon: Mcarthur Rossetti, MD;  Location: [facility];  Service: Orthopedics;  Laterality: Right;   SHOULDER SURGERY     TRACHEOSTOMY     closed    Social History  reports that he quit smoking about 3 months ago. His smoking use included cigarettes. He started smoking about 4 months ago. He has a 3.75 pack-year smoking history. He has never used smokeless tobacco. He reports that he does not drink alcohol and does not use drugs.  Allergies  Allergen Reactions   Codeine Nausea Only   Hydromorphone Other (See Comments)    hallucinations     Family History  Problem Relation Age of Onset   Hypertension Father    CVA Father    Lung cancer Father    Alzheimer's disease Mother    Hypertension Sister    Heart failure Sister      Prior to Admission medications   Medication Sig Start Date End Date Taking? Authorizing Provider  aspirin 81 MG tablet Take 1 tablet (81 mg total) by mouth daily. 02/01/13  Yes [name], MD  atorvastatin (LIPITOR) 20 MG tablet Take 20 mg by mouth daily. 02/02/19  Yes [provider]  BLACK ELDERBERRY PO Take 2 each by mouth daily as needed (immune support). Gummies   Yes [provider]  bumetanide (BUMEX) 1 MG tablet Take 1 tablet (1 mg total) by mouth 2 (two) times daily. 04/17/21 07/16/21 Yes Tolia, Sunit, DO  dapagliflozin propanediol (FARXIGA) 10 MG  TABS tablet Take 1 tablet (10 mg total) by mouth daily. 04/10/21 04/10/22 Yes Jennye Boroughs, MD  diltiazem (CARDIZEM CD) 360 MG 24 hr capsule Take 360 mg by mouth daily.   Yes [provider]  fluticasone (FLONASE) 50 MCG/ACT nasal spray Place 2 sprays into both nostrils daily as needed for allergies or rhinitis.   Yes [provider]  isosorbide-hydrALAZINE (BIDIL) 20-37.5 MG tablet Take 1 tablet by mouth 3 (three) times daily. 03/30/21  Yes Tolia, Sunit, DO  labetalol (NORMODYNE) 300 MG tablet Take 300 mg by mouth 3 (three) times daily.   Yes [provider]  sacubitril-valsartan (ENTRESTO) 49-51 MG Take 1 tablet by mouth 2 (two) times daily. 04/17/21 07/16/21 Yes Tolia, Sunit, DO  tamsulosin (FLOMAX) 0.4 MG CAPS capsule Take 0.4 mg by mouth daily. 11/01/18  Yes [provider]  traZODone (DESYREL) 150 MG tablet Take 1 tablet (150 mg total) by mouth at bedtime as needed for sleep. 12/22/20  Yes Dohmeier, Asencion Partridge, MD    Physical Exam: Vitals:   04/17/21 1945 04/17/21 1953 04/17/21 2015 04/17/21 2145  BP: (!) 177/110  (!) 182/120 (!) 168/105  Pulse: 74  81 72  Resp: 19  20 (!) 26  Temp:  98.3 F (36.8 C)    TempSrc:  Oral    SpO2: 94%  (!) 86% 97%  Weight:      Height:        Constitutional: Tired appearing.  Comfortable on Bipap Vitals:   04/17/21 1945 04/17/21 1953 04/17/21 2015 04/17/21 2145  BP: (!) 177/110  (!) 182/120 (!) 168/105  Pulse: 74  81 72  Resp: 19  20 (!) 26  Temp:  98.3 F (36.8 C)    TempSrc:  Oral    SpO2: 94%  (!) 86% 97%  Weight:      Height:       General: WDWN, Alert and oriented x3.  Eyes: EOMI, PERRL, conjunctivae normal.  Sclera nonicteric HENT:  [place]/AT, external ears normal.  Nares patent without epistasis. Bipap mask in good position. No external mouth lesions. Neck: Soft, normal range of motion, supple, no masses, Trachea midline Respiratory: On bipap. Diminished breath sounds with diffuse rales and basilar crackles. no  wheezing. Normal respiratory effort. No accessory muscle use.  Cardiovascular: Regular rate and rhythm, no murmurs / rubs / gallops. +1 lower extremity edema. 1+ pedal pulses.  Abdomen: Soft, no tenderness, nondistended, no rebound or guarding.  No masses palpated. Bowel sounds normoactive Musculoskeletal: FROM. no cyanosis. No joint deformity upper and lower extremities. Normal muscle tone.  Skin: Warm, dry, intact no rashes, lesions, ulcers. No induration Neurologic: CN 2-12 grossly intact.  Normal speech.  Sensation intact to touch. Strength 5/5 in all extremities.   Psychiatric: Normal judgment and insight.  Normal mood.    Labs on Admission: I have personally reviewed following labs and imaging studies  CBC: Recent Labs  Lab 04/17/21 1930  WBC 6.8  NEUTROABS 5.3  HGB 12.4*  HCT 38.6*  MCV 92.8  PLT 0000000    Basic Metabolic Panel: Recent Labs  Lab 04/14/21 0712 04/14/21 0713 04/17/21 1930  NA 145*  --  137  K 3.9  --  3.4*  CL 103  --  99  CO2 26  --  31  GLUCOSE 107*  --  120*  BUN 14  --  12  CREATININE 1.79*  --  1.78*  CALCIUM 9.4  --  9.0  MG  --  2.1  --     GFR: Estimated Creatinine Clearance: 51.5 mL/min (A) (by C-G formula based on SCr of 1.78 mg/dL (H)).  Liver Function Tests: Recent Labs  Lab 04/17/21 1930  AST 27  ALT 25  ALKPHOS 83  BILITOT 0.5  PROT 6.6  ALBUMIN 3.2*    Urine analysis:    Component Value Date/Time   COLORURINE YELLOW 04/01/2021 2255   APPEARANCEUR CLEAR 04/01/2021 2255   LABSPEC 1.013 04/01/2021 2255   PHURINE 6.0 04/01/2021 2255   GLUCOSEU 150 (A) 04/01/2021 2255   HGBUR SMALL (A) 04/01/2021 2255   BILIRUBINUR NEGATIVE 04/01/2021 2255   KETONESUR NEGATIVE 04/01/2021 2255   PROTEINUR >=300 (A) 04/01/2021 2255   UROBILINOGEN 0.2 11/16/2012 1004   NITRITE NEGATIVE 04/01/2021 2255   LEUKOCYTESUR NEGATIVE 04/01/2021 2255    Radiological Exams on Admission: DG Chest  Portable 1 View  Result Date: 04/17/2021 CLINICAL  DATA:  Shortness of breath EXAM: PORTABLE CHEST 1 VIEW COMPARISON:  04/07/2021 FINDINGS: Cardiomegaly with vascular congestion and pulmonary edema. Probable small right-sided pleural effusion. No pneumothorax. IMPRESSION: Cardiomegaly with vascular congestion, pulmonary edema and possible small right effusion Electronically Signed   By: Donavan Foil M.D.   On: 04/17/2021 21:18    EKG: Independently reviewed.  EKG shows normal sinus rhythm with no acute ST elevation or depression.  QTc prolonged at 524  Assessment/Plan Principal Problem:   Acute on chronic heart failure with preserved ejection fraction (HFpEF) Mr. Scalone is admitted. Continue Bipap to help support respiration and push fluid from lung fields.  Bumex to diuresis. Monitor I&Os, daily weight.  Continue Clydia Llano, Farxiga for HFpEF. Continue Beta blocker therapy. Check serial troponins overnight.  Initial troponins were negative at 12 in the ER. Had an echo in July 2022 so would not be repeated at this time.  Noted EF of 60 to 65% with mild LVH and diastolic dysfunction  Active Problems:   Acute hypoxemic respiratory failure Pt is on Bipap and tolerating it well. Will work to wean as condition improves. RT following.  Wife reports that his PCP office called out a z-pak earlier today and he took dose at home around 3 pm. No signs of infection and with prolonged QT interval on EKG will not continue azithromyocin.     Essential hypertension Continue home medications of labetolol, diltiazem, bildil, entresto. Monitor BP    Stage 3b chronic kidney disease Chronic. Monitor renal function with labs in am    Sarcoidosis Reports he is on prednisone 5 mg a day. This is not on his medication list so will need to be verified by pharmacy and resumed.     Prolonged QT interval Avoid medications were to further prolong QT interval.    Tobacco abuse Patient states he is actively trying to quit smoking and is smoking a less than a  third of a pack cigarettes a day.  Nicotine patch provided to help prevent withdrawals while in the hospital    DVT prophylaxis: Pt is placed on Lovenox. Is given therapeutic dose tonight while work up for PE ongoing  Code Status:   Full Code  Family Communication:  Diagnosis and plan discussed with patient who is at bedside.  They verbalized understanding and agree with plan.  Questions answered.  Further recommendations to follow as clinically indicated Disposition Plan:   Patient is from:  Home  Anticipated DC to:  Home  Anticipated DC date:  Anticipate 2 midnight or more stay in hospital  Anticipated DC barriers: No barriers to discharge identified at this time  Admission status:  Inpatient   Yevonne Aline Fedor Kazmierski MD [facility]  How to contact the [name] Attending or Consulting provider [place] or covering provider during after hours [place], for this patient?   Check the care team in [facility] and look for a) attending/consulting [facility] provider listed and b) the [facility] team listed Log into [email or web address] and use [place]'s universal password to access. If you do not have the password, please contact the hospital operator. Locate the [facility] provider you are looking for under [facility] and page to a number that you can be directly reached. If you still have difficulty reaching the provider, please page the [facility] (Director on Call) for the Hospitalists listed on amion for assistance.  04/17/2021, 10:44 PM

## 2021-04-17 NOTE — ED Provider Notes (Signed)
[facility] EMERGENCY DEPARTMENT Provider Note   CSN: [id number] Arrival date & time: 04/17/21  1906     History Chief Complaint  Patient presents with   Shortness of Breath    [name] is a 65 y.o. male with a history congestive heart failure, sarcoidosis, presenting the emergency department with shortness of breath.  Patient was just discharged from the hospital on August 4 after admission for acute on chronic diastolic heart failure, subsequently proved with IV diuresis.  He is normally not requiring oxygen at home.  He and his wife report that he has had nasal congestion for the past several days, but 2 days ago began having a productive cough and difficulty breathing.  They noted at the cardiology office that his oxygenation levels around 80%, which is low for him.  He normally is on room air not requiring oxygen.  Denies any history of COPD.  He is a former smoker but quit.  In the past he has been diuresed with IV Bumex in the hospital.  Last echo on 03/23/21: IMPRESSIONS     1. Left ventricular ejection fraction, by estimation, is 60 to 65%. The  left ventricle has normal function. The left ventricle has no regional  wall motion abnormalities. There is mild left ventricular hypertrophy.  Left ventricular diastolic parameters  are consistent with Grade I diastolic dysfunction (impaired relaxation).   2. Right ventricular systolic function is normal. The right ventricular  size is normal. There is moderately elevated pulmonary artery systolic  pressure. The estimated right ventricular systolic pressure is 0000000 mmHg.   3. Left atrial size was severely dilated.   4. The mitral valve is normal in structure. Mild mitral valve  regurgitation. No evidence of mitral stenosis.   5. The aortic valve is calcified. Aortic valve regurgitation is mild. No  aortic stenosis is present.   6. The inferior vena cava is dilated in size with <50% respiratory   variability, suggesting right atrial pressure of 15 mmHg.   HPI     Past Medical History:  Diagnosis Date   Back pain    CKD (chronic kidney disease), stage III (HCC)    CVA (cerebral infarction)    Diastolic dysfunction    ED (erectile dysfunction)    GERD (gastroesophageal reflux disease)    Hypercalcemia    Hyperlipidemia    Hypertension    Insomnia    Long-term use of high-risk medication    Nephrocalcinosis    Nephrolithiasis    Obesity    Osteopenia    Pancreatitis    PE (pulmonary embolism)    Proteinuria    Sarcoidosis    Smoker     Patient Active Problem List   Diagnosis Date Noted   Acute on chronic heart failure with preserved ejection fraction (HFpEF) ([place]) 04/17/2021   Prolonged QT interval 04/17/2021   COPD exacerbation ([place])    Noncompliance    Acute on chronic diastolic heart failure ([place]) 04/07/2021   Acute hypoxemic respiratory failure ([place]) 04/07/2021   Acute on chronic diastolic CHF (congestive heart failure) ([place]) 03/22/2021   Encephalopathy acute 12/22/2020   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   Chronic intermittent hypoxia with obstructive sleep apnea 12/22/2020   Mild cognitive impairment 12/22/2020   Acute combined systolic and diastolic congestive heart failure ([place]) 12/22/2020   Rotator cuff arthropathy, right 12/04/2020   Volume overload 12/04/2020   Shortness of breath 12/04/2020   ARF (acute renal failure) ([place]) 11/19/2020  Acute respiratory failure ([place]) 11/19/2020   Bacteremia due to methicillin susceptible Staphylococcus aureus (MSSA) 11/19/2020   Cranial neuropathy    Acute encephalopathy 11/18/2020   Chronic right shoulder pain 06/18/2020   OSA (obstructive sleep apnea) 05/05/2020   Hypersomnia with sleep apnea 12/20/2019   Psychophysiological insomnia 09/18/2019   Nocturia more than twice per night 09/18/2019   Renal hypertension 09/18/2019   Moderate asthma 09/18/2019   Tobacco abuse  09/18/2019   Non-restorative sleep 09/18/2019   Nontraumatic complete tear of right rotator cuff 08/15/2019   Delirium 03/16/2019   Stage 3b chronic kidney disease    CVA (cerebral infarction)    Diastolic dysfunction    Pseudarthrosis 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 02/28/2019   Intervertebral disc disorder with radiculopathy of lumbar region 09/13/2017   Acute gouty arthritis 03/07/2017   Arthralgia of left foot 03/07/2017   Nephrolithiasis    Fever, unspecified 11/02/2015   Essential hypertension 02/07/2015   Pulmonary embolism ([place]) 01/30/2013   Sarcoidosis 01/30/2013   Hemoptysis 01/30/2013   SHOULDER PAIN, LEFT 11/28/2009    Past Surgical History:  Procedure Laterality Date   ABDOMINAL EXPLORATION SURGERY     BACK SURGERY     CHOLECYSTECTOMY     SHOULDER ARTHROSCOPY Right 10/30/2020   Procedure: ARTHROSCOPY SHOULDER WITH EXTENSIVE DEBRIDEMENT;  Surgeon: Mcarthur Rossetti, MD;  Location: [place];  Service: Orthopedics;  Laterality: Right;   SHOULDER SURGERY     TRACHEOSTOMY     closed       Family History  Problem Relation Age of Onset   Hypertension Father    CVA Father    Lung cancer Father    Alzheimer's disease Mother    Hypertension Sister    Heart failure Sister     Social History   Tobacco Use   Smoking status: Former    Packs/day: 0.25    Years: 15.00    Pack years: 3.75    Types: Cigarettes    Start date: 11/27/2020    Quit date: 12/30/2020    Years since quitting: 0.2   Smokeless tobacco: Never  Vaping Use   Vaping Use: Never used  Substance Use Topics   Alcohol use: No    Alcohol/week: 0.0 standard drinks   Drug use: No    Home Medications Prior to Admission medications   Medication Sig Start Date End Date Taking? Authorizing Provider  aspirin 81 MG tablet Take 1 tablet (81 mg total) by mouth daily. 02/01/13  Yes [name], MD  atorvastatin (LIPITOR) 20 MG tablet Take 20 mg by mouth daily. 02/02/19  Yes [provider]  BLACK ELDERBERRY PO Take 2 each by mouth daily as needed (immune support). Gummies   Yes [provider]  bumetanide (BUMEX) 1 MG tablet Take 1 tablet (1 mg total) by mouth 2 (two) times daily. 04/17/21 07/16/21 Yes Tolia, Sunit, DO  dapagliflozin propanediol (FARXIGA) 10 MG TABS tablet Take 1 tablet (10 mg total) by mouth daily. 04/10/21 04/10/22 Yes Jennye Boroughs, MD  diltiazem (CARDIZEM CD) 360 MG 24 hr capsule Take 360 mg by mouth daily.   Yes [provider]  fluticasone (FLONASE) 50 MCG/ACT nasal spray Place 2 sprays into both nostrils daily as needed for allergies or rhinitis.   Yes [provider]  isosorbide-hydrALAZINE (BIDIL) 20-37.5 MG tablet Take 1 tablet by mouth 3 (three) times daily. 03/30/21  Yes Tolia, Sunit, DO  labetalol (NORMODYNE) 300 MG tablet Take 300 mg by mouth 3 (three) times  daily.   Yes [provider]  sacubitril-valsartan (ENTRESTO) 49-51 MG Take 1 tablet by mouth 2 (two) times daily. 04/17/21 07/16/21 Yes Tolia, Sunit, DO  tamsulosin (FLOMAX) 0.4 MG CAPS capsule Take 0.4 mg by mouth daily. 11/01/18  Yes [provider]  traZODone (DESYREL) 150 MG tablet Take 1 tablet (150 mg total) by mouth at bedtime as needed for sleep. 12/22/20  Yes Dohmeier, Asencion Partridge, MD    Allergies    Codeine and Hydromorphone  Review of Systems   Review of Systems  Constitutional:  Negative for chills and fever.  HENT:  Positive for congestion. Negative for ear pain and sore throat.   Eyes:  Negative for pain and visual disturbance.  Respiratory:  Positive for cough and shortness of breath.   Cardiovascular:  Positive for leg swelling. Negative for chest pain and palpitations.  Gastrointestinal:  Negative for abdominal pain and vomiting.  Genitourinary:  Negative for dysuria and hematuria.  Musculoskeletal:  Negative for arthralgias and back pain.  Skin:  Negative for color change and rash.  Neurological:  Negative for seizures and  syncope.  All other systems reviewed and are negative.  Physical Exam Updated Vital Signs BP (!) 169/111   Pulse 79   Temp 98.3 F (36.8 C) (Oral)   Resp (!) 31   Ht 6' (1.829 m)   Wt 100.7 kg   SpO2 94%   BMI 30.11 kg/m   Physical Exam Constitutional:      General: He is not in acute distress. HENT:     Head: Normocephalic and atraumatic.  Eyes:     Conjunctiva/sclera: Conjunctivae normal.     Pupils: Pupils are equal, round, and reactive to light.  Cardiovascular:     Rate and Rhythm: Normal rate and regular rhythm.  Pulmonary:     Comments: Bilateral rales, 85% on room air, RR 30 Abdominal:     General: There is no distension.     Tenderness: There is no abdominal tenderness.  Musculoskeletal:     Right lower leg: Edema present.     Left lower leg: Edema present.  Skin:    General: Skin is warm and dry.  Neurological:     General: No focal deficit present.     Mental Status: He is alert. Mental status is at baseline.  Psychiatric:        Mood and Affect: Mood normal.        Behavior: Behavior normal.    ED Results / Procedures / Treatments   Labs (all labs ordered are listed, but only abnormal results are displayed) Labs Reviewed  CBC WITH DIFFERENTIAL/PLATELET - Abnormal; Notable for the following components:      Result Value   RBC 4.16 (*)    Hemoglobin 12.4 (*)    HCT 38.6 (*)    RDW 16.0 (*)    Lymphs Abs 0.5 (*)    All other components within normal limits  COMPREHENSIVE METABOLIC PANEL - Abnormal; Notable for the following components:   Potassium 3.4 (*)    Glucose, Bld 120 (*)    Creatinine, Ser 1.78 (*)    Albumin 3.2 (*)    GFR, Estimated 42 (*)    All other components within normal limits  BRAIN NATRIURETIC PEPTIDE - Abnormal; Notable for the following components:   B Natriuretic Peptide 2,729.0 (*)    All other components within normal limits  D-DIMER, QUANTITATIVE - Abnormal; Notable for the following components:   D-Dimer, Quant 0.82  (*)  All other components within normal limits  RESP PANEL BY RT-PCR (FLU A&B, COVID) ARPGX2  BASIC METABOLIC PANEL  CBC  TROPONIN I (HIGH SENSITIVITY)  TROPONIN I (HIGH SENSITIVITY)  TROPONIN I (HIGH SENSITIVITY)    EKG EKG Interpretation  Date/Time:  Friday April 17 2021 19:41:07 EDT Ventricular Rate:  75 PR Interval:  171 QRS Duration: 102 QT Interval:  469 QTC Calculation: 524 R Axis:   26 Text Interpretation: Sinus rhythm Baseline wander lateral leads Prolonged QT interval Confirmed by Octaviano Glow [phone]) on 04/18/2021 12:33:56 AM  Radiology DG Chest Portable 1 View  Result Date: 04/17/2021 CLINICAL DATA:  Shortness of breath EXAM: PORTABLE CHEST 1 VIEW COMPARISON:  04/07/2021 FINDINGS: Cardiomegaly with vascular congestion and pulmonary edema. Probable small right-sided pleural effusion. No pneumothorax. IMPRESSION: Cardiomegaly with vascular congestion, pulmonary edema and possible small right effusion Electronically Signed   By: Donavan Foil M.D.   On: 04/17/2021 21:18    Procedures .Critical Care  Date/Time: 04/17/2021 8:47 PM Performed by: Wyvonnia Dusky, MD Authorized by: Wyvonnia Dusky, MD   Critical care provider statement:    Critical care time (minutes):  45   Critical care time was exclusive of:  Separately billable procedures and treating other patients   Critical care was necessary to treat or prevent imminent or life-threatening deterioration of the following conditions:  Respiratory failure   Critical care was time spent personally by me on the following activities:  Discussions with consultants, evaluation of patient's response to treatment, examination of patient, ordering and performing treatments and interventions, ordering and review of laboratory studies, ordering and review of radiographic studies, pulse oximetry, re-evaluation of patient's condition, obtaining history from patient or surrogate and review of old charts   I assumed  direction of critical care for this patient from another provider in my specialty: no     Care discussed with: admitting provider     Medications Ordered in ED Medications  isosorbide-hydrALAZINE (BIDIL) 20-37.5 MG per tablet 1 tablet (1 tablet Oral Given 04/17/21 2147)  aspirin chewable tablet 81 mg (has no administration in time range)  atorvastatin (LIPITOR) tablet 20 mg (has no administration in time range)  diltiazem (CARDIZEM CD) 24 hr capsule 360 mg (has no administration in time range)  labetalol (NORMODYNE) tablet 300 mg (300 mg Oral Given 04/17/21 2327)  sacubitril-valsartan (ENTRESTO) 49-51 mg per tablet (1 tablet Oral Given 04/17/21 2328)  traZODone (DESYREL) tablet 150 mg (has no administration in time range)  dapagliflozin propanediol (FARXIGA) tablet 10 mg (has no administration in time range)  tamsulosin (FLOMAX) capsule 0.4 mg (has no administration in time range)  enoxaparin (LOVENOX) injection 100 mg (100 mg Subcutaneous Given 04/17/21 2357)  sodium chloride flush (NS) 0.9 % injection 3 mL (3 mLs Intravenous Not Given 04/17/21 2328)  sodium chloride flush (NS) 0.9 % injection 3 mL (has no administration in time range)  0.9 %  sodium chloride infusion (has no administration in time range)  acetaminophen (TYLENOL) tablet 650 mg (has no administration in time range)    Or  acetaminophen (TYLENOL) suppository 650 mg (has no administration in time range)  senna-docusate (Senokot-S) tablet 1 tablet (has no administration in time range)  nicotine (NICODERM CQ - dosed in mg/24 hr) patch 7 mg (has no administration in time range)  bumetanide (BUMEX) injection 1 mg (has no administration in time range)  bumetanide (BUMEX) injection 1 mg (1 mg Intravenous Given 04/17/21 2144)    ED Course  I have reviewed the  triage vital signs and the nursing notes.  Pertinent labs & imaging results that were available during my care of the patient were reviewed by me and considered in my medical  decision making (see chart for details).  This patient complains of shortness of breath. This involves an extensive number of treatment options, and is a complaint that carries with it a high risk of complications and morbidity.  The differential diagnosis includes exacerbation of congestive heart failure versus pneumonia versus COVID-19 versus other.  This is mostly massive pulmonary embolism causing hypoxia.  He has no tachycardia.  His troponin is 12.  Clinically is most consistent with CHF exacerbation.  Unclear what the underlying cause may be, but perhaps related to sarcoidosis.  KHARTER REXROTH was evaluated in Emergency Department on 04/18/2021 for the symptoms described in the history of present illness. He was evaluated in the context of the global COVID-19 pandemic, which necessitated consideration that the patient might be at risk for infection with the SARS-CoV-2 virus that causes COVID-19. Institutional protocols and algorithms that pertain to the evaluation of patients at risk for COVID-19 are in a state of rapid change based on information released by regulatory bodies including the CDC and federal and state organizations. These policies and algorithms were followed during the patient's care in the ED.  Reviewed his labs.  His BNP is elevated from discharge one week ago, today is 2729. I ordered medication IV Bumex for diuresis.  P.o. blood pressure medication. I ordered imaging studies which included dg chest I independently visualized and interpreted imaging which showed vascular congestion and cardiomegaly Additional history was obtained from patient's wife Previous records obtained and reviewed showing recent hospitalization course and echocardiogram.  WBC normal - no evidence of PNA on xray.  Doubt sepsis.   Clinical Course as of 04/18/21 0034  Ludwig Clarks Apr 17, 2021  2046 Advised we start the patient on BiPAP due to his hypoxia and increased work of breathing.  I do not believe  requires immediate intubation. [MT]  2155 COVID is negative.  On reassessment the patient is breathing much more comfortably on BiPAP.  Diuresis just started now.  Consulted hospitalist for admission. [MT]    Clinical Course User Index [MT] Jaylani Mcguinn, Carola Rhine, MD    Final Clinical Impression(s) / ED Diagnoses Final diagnoses:  Acute on chronic congestive heart failure, unspecified heart failure type [facility])    Rx / DC Orders ED Discharge Orders     None        Wyvonnia Dusky, MD 04/18/21 [phone]

## 2021-04-17 NOTE — ED Notes (Signed)
Called to room by spouse. Found pt out of bed standing at side of bed. Pt had urinated on himself and in the bed. Pt changed and sheets changed at this time. Pt was placed back in bed and placed back on the monitor at this time

## 2021-04-17 NOTE — ED Notes (Signed)
Patient placed on primofit

## 2021-04-17 NOTE — ED Notes (Signed)
resp at bedside

## 2021-04-17 NOTE — ED Provider Notes (Addendum)
Emergency Medicine Provider Triage Evaluation Note  [name] , a 65 y.o. male  was evaluated in triage.  Pt complains of sob that started a few days ago. Further reports rle swelling x3 days. Recently admitted for chf exacerbation.  Review of Systems  Positive: Sob, cough, leg swelling Negative: Chest pain  Physical Exam  BP (!) 172/96 (BP Location: Right Arm)   Pulse 69   Temp 98.4 F (36.9 C) (Oral)   Resp (!) 22   SpO2 95%  Gen:   Awake, ill appearing Resp:  Normal effort  MSK:   Moves extremities without difficulty  Other:  Rhonchi and rales, speaking in 1-2 word sentences, rle edema  Medical Decision Making  Medically screening exam initiated at 7:18 PM.  Appropriate orders placed.  [name] was informed that the remainder of the evaluation will be completed by another provider, this initial triage assessment does not replace that evaluation, and the importance of remaining in the ED until their evaluation is complete.  Nursing staff made aware that pt needs room   Rodney Booze, PA-C 04/17/21 1921    Bishop Dublin 04/17/21 1923    Hayden Rasmussen, MD 04/18/21 1055

## 2021-04-17 NOTE — Progress Notes (Signed)
ID:  SHIELDS PAUTZ, DOB 04-21-1956, MRN [id number]  PCP:  [name], MD  Cardiologist: Rex Kras, DO, [facility] (established care 12/10/2020)  Date: 04/17/21 Last Office Visit: 02/23/2021  Chief Complaint  Patient presents with   Acute heart failure with preserved ejection fraction (HFpEF   Hospitalization Follow-up    HPI  [name] is a 65 y.o. male who presents to the office with a chief complaint of " heart failure & post hospitalization visit." Patient's past medical history and cardiovascular risk factors include: Sarcoidosis, HTN, Tobacco abuse, Hx of PE (was on Xarelto for 1 year), OSA not on CPAP, MSSA Bacteremia (March 2022), Hx of cardiac arrest (2002 during his hospitalization for pancreatitis per wife).  He is referred to the office at the request of [name], MD for evaluation of heart failure.  Patient has been followed by our practice for management of HFpEF and was doing very well with regards to shortness of breath, remaining euvolemic, and being compliant with medical therapy.  However, since last office visit he has been hospitalized approximately twice for dyspnea/hypoxia.  He was treated for heart failure management while in the hospital with parenteral diuretics and now presents for follow-up.  Despite multiple attempts with regards to educating him on bringing his cardiac medication bottles with him on accurate medication list he fails to do so.  In addition, he had labs on 04/14/2021 which notes improvement in NT proBNP compared to April 06, 2021.  The BMP was not processed and [facility] has already been contacted regarding this.  Clinically patient appears to be experiencing shortness of breath but he has been having upper respiratory tract infection-like symptoms.  He states that the symptoms are not as bad as his last 2 hospitalizations.  Patient states that he has not smoked cigarettes over the last 1 week.  He has been compliant with his heart failure  medications.  FUNCTIONAL STATUS: No structured exercise program or daily routine.   ALLERGIES: Allergies  Allergen Reactions   Codeine Nausea Only   Hydromorphone Other (See Comments)    hallucinations     MEDICATION LIST PRIOR TO VISIT: Current Meds  Medication Sig   aspirin 81 MG tablet Take 1 tablet (81 mg total) by mouth daily.   atorvastatin (LIPITOR) 20 MG tablet Take 20 mg by mouth daily.   BLACK ELDERBERRY PO Take 2 each by mouth daily as needed (immune support). Gummies   dapagliflozin propanediol (FARXIGA) 10 MG TABS tablet Take 1 tablet (10 mg total) by mouth daily.   diltiazem (CARDIZEM CD) 360 MG 24 hr capsule Take 360 mg by mouth daily.   diphenhydrAMINE (BENADRYL) 25 mg capsule Take 25 mg by mouth every 6 (six) hours as needed.   fluticasone (FLONASE) 50 MCG/ACT nasal spray Place 2 sprays into both nostrils daily as needed for allergies or rhinitis.   isosorbide-hydrALAZINE (BIDIL) 20-37.5 MG tablet Take 1 tablet by mouth 3 (three) times daily.   labetalol (NORMODYNE) 300 MG tablet Take 300 mg by mouth 3 (three) times daily.   predniSONE (DELTASONE) 5 MG tablet Take 5 mg by mouth daily. Reported on 11/02/2015   tamsulosin (FLOMAX) 0.4 MG CAPS capsule Take 0.4 mg by mouth daily.   traZODone (DESYREL) 150 MG tablet Take 1 tablet (150 mg total) by mouth at bedtime as needed for sleep.   [DISCONTINUED] bumetanide (BUMEX) 1 MG tablet Take 1 tablet (1 mg total) by mouth daily.   [DISCONTINUED] sacubitril-valsartan (ENTRESTO) 49-51 MG Take  1 tablet by mouth 2 (two) times daily.     PAST MEDICAL HISTORY: Past Medical History:  Diagnosis Date   Back pain    CKD (chronic kidney disease), stage III (HCC)    CVA (cerebral infarction)    Diastolic dysfunction    ED (erectile dysfunction)    GERD (gastroesophageal reflux disease)    Hypercalcemia    Hyperlipidemia    Hypertension    Insomnia    Long-term use of high-risk medication    Nephrocalcinosis    Nephrolithiasis     Obesity    Osteopenia    Pancreatitis    PE (pulmonary embolism)    Proteinuria    Sarcoidosis    Smoker   Patient and wife denies hx of CVA.   PAST SURGICAL HISTORY: Past Surgical History:  Procedure Laterality Date   ABDOMINAL EXPLORATION SURGERY     BACK SURGERY     CHOLECYSTECTOMY     SHOULDER ARTHROSCOPY Right 10/30/2020   Procedure: ARTHROSCOPY SHOULDER WITH EXTENSIVE DEBRIDEMENT;  Surgeon: Mcarthur Rossetti, MD;  Location: [place];  Service: Orthopedics;  Laterality: Right;   SHOULDER SURGERY     TRACHEOSTOMY     closed    FAMILY HISTORY: The patient family history includes Alzheimer's disease in his mother; CVA in his father; Heart failure in his sister; Hypertension in his father and sister; Lung cancer in his father.  SOCIAL HISTORY:  The patient  reports that he quit smoking about 3 months ago. His smoking use included cigarettes. He started smoking about 4 months ago. He has a 3.75 pack-year smoking history. He has never used smokeless tobacco. He reports that he does not drink alcohol and does not use drugs.  REVIEW OF SYSTEMS: Review of Systems  Constitutional: Positive for malaise/fatigue (improved) and weight loss. Negative for chills, fever and weight gain.  HENT:  Negative for hoarse voice and nosebleeds.   Eyes:  Negative for discharge, double vision and pain.  Cardiovascular:  Negative for chest pain, claudication, dyspnea on exertion, leg swelling, near-syncope, orthopnea, palpitations, paroxysmal nocturnal dyspnea and syncope.  Respiratory:  Negative for hemoptysis and shortness of breath.   Musculoskeletal:  Negative for muscle cramps and myalgias.  Gastrointestinal:  Negative for abdominal pain, constipation, diarrhea, hematemesis, hematochezia, melena, nausea and vomiting.  Neurological:  Negative for dizziness and light-headedness.   PHYSICAL EXAM: Vitals with BMI 04/17/2021 04/17/2021 04/09/2021  Height - '6\' 0"'  -  Weight -  222 lbs -  BMI - 99.8 -  Systolic 338 250 539  Diastolic 78 96 93  Pulse 67 72 63    CONSTITUTIONAL: Appears older than stated age, well-developed and well-nourished. No acute distress.  SKIN: Skin is warm and dry. No rash noted. No cyanosis. No pallor. No jaundice HEAD: Normocephalic and atraumatic.  EYES: No scleral icterus MOUTH/THROAT: Moist oral membranes.  NECK: No JVD present. No thyromegaly noted. No carotid bruits  LYMPHATIC: No visible cervical adenopathy.  CHEST Normal respiratory effort. No intercostal retractions  LUNGS: Clear to auscultation bilaterally.  No stridor. No wheezes. No rales.  CARDIOVASCULAR: Regular, positive J6-B3, soft holosystolic murmur heard at the apex, no gallops or rubs. ABDOMINAL: Obese, soft soft, nontender, distended, positive bowel sounds in all 4 quadrants, no apparent ascites.  EXTREMITIES: Trace bilateral pitting edema, no open wounds or ulcers.   HEMATOLOGIC: No significant bruising NEUROLOGIC: Oriented to person, place, and time. Nonfocal. Normal muscle tone.  PSYCHIATRIC: Normal mood and affect. Normal behavior. Cooperative  CARDIAC DATABASE:  EKG: 12/18/2020: Sinus bradycardia, 58 bpm, left atrial enlargement, diffuse T wave inversions.  No significant change compared to prior ECG.  Echocardiogram: 03/23/2021: LVEF 60-65%, mild LVH, grade 1 diastolic impairment, RVSP 49 mmHg consistent with mild pulmonary hypertension, severely dilated left atrium, mild MR, mild AR,  Stress Testing: No results found for this or any previous visit from the past 1095 days.   Heart Catheterization: None  LABORATORY DATA: CBC Latest Ref Rng & Units 04/08/2021 04/07/2021 04/01/2021  WBC 4.0 - 10.5 K/uL 8.1 10.0 9.0  Hemoglobin 13.0 - 17.0 g/dL 11.8(L) 11.6(L) 11.2(L)  Hematocrit 39.0 - 52.0 % 34.9(L) 34.7(L) 34.6(L)  Platelets 150 - 400 K/uL 188 195 187    CMP Latest Ref Rng & Units 04/08/2021 04/07/2021 04/06/2021  Glucose 70 - 99 mg/dL 162(H) 145(H) 124(H)   BUN 8 - 23 mg/dL '17 15 16  ' Creatinine 0.61 - 1.24 mg/dL 1.63(H) 1.63(H) 1.53(H)  Sodium 135 - 145 mmol/L 140 140 146(H)  Potassium 3.5 - 5.1 mmol/L 3.6 3.6 3.6  Chloride 98 - 111 mmol/L 103 106 109(H)  CO2 22 - 32 mmol/L '26 28 25  ' Calcium 8.9 - 10.3 mg/dL 9.2 8.9 8.7  Total Protein 6.5 - 8.1 g/dL - - -  Total Bilirubin 0.3 - 1.2 mg/dL - - -  Alkaline Phos 38 - 126 U/L - - -  AST 15 - 41 U/L - - -  ALT 0 - 44 U/L - - -    Lipid Panel     Component Value Date/Time   CHOL 111 04/09/2021 0147   TRIG 53 04/09/2021 0147   HDL 62 04/09/2021 0147   CHOLHDL 1.8 04/09/2021 0147   VLDL 11 04/09/2021 0147   LDLCALC 38 04/09/2021 0147    No components found for: NTPROBNP Recent Labs    12/15/20 0909 01/02/21 0752 01/16/21 0713 02/03/21 0717 03/06/21 0712 03/30/21 0707 04/06/21 0725 04/14/21 0712  PROBNP 4,473* 2,337* 1,789* 733* 1,171* 2,643* 5,653* 3,473*   Recent Labs    11/19/20 0233  TSH 1.247    BMP Recent Labs    04/01/21 2303 04/06/21 0726 04/07/21 1251 04/08/21 0232  NA 134* 146* 140 140  K 3.3* 3.6 3.6 3.6  CL 105 109* 106 103  CO2 '25 25 28 26  ' GLUCOSE 99 124* 145* 162*  BUN '15 16 15 17  ' CREATININE 1.73* 1.53* 1.63* 1.63*  CALCIUM 8.1* 8.7 8.9 9.2  GFRNONAA 44*  --  47* 47*    HEMOGLOBIN A1C Lab Results  Component Value Date   HGBA1C 5.7 (H) 04/08/2021   MPG 116.89 04/08/2021    External Labs: Collected: 12/08/2020 Creatinine 1.7 mg/dL. (Serum creatinine 1.6 mg/dL collected 07/11/2020) eGFR: 40.9 mL/min per 1.73 m Sodium 143, potassium 3.5, chloride 104, bicarb 32 Hemoglobin 12.6, hematocrit 38.2 NT proBNP 5576  IMPRESSION:    ICD-10-CM   1. Acute heart failure with preserved ejection fraction (HFpEF) (HCC)  I50.31 sacubitril-valsartan (ENTRESTO) 49-51 MG    bumetanide (BUMEX) 1 MG tablet    Basic metabolic panel    Magnesium    Pro b natriuretic peptide (BNP)    2. Essential hypertension  I10     3. Dyspnea on exertion  R06.00      4. Tobacco abuse  Z72.0     5. Hx pulmonary embolism  Z86.711     6. OSA (obstructive sleep apnea)  G47.33     7. Mixed hyperlipidemia  E78.2        RECOMMENDATIONS: Kawika Bischoff  Hanshaw is a 65 y.o. male whose past medical history and cardiac risk factors include: Sarcoidosis, HTN, Tobacco abuse, Hx of PE (was on Xarelto for 1 year), OSA not on CPAP, MSSA Bacteremia (March 2022), Hx of cardiac arrest (2002 during his hospitalization for pancreatitis per wife).  Acute on Chronic heart failure with preserved EF, NYHA class II: Most recent labs from 04/14/2021 reviewed.  BMP is currently pending [facility] has been contacted Refilled Entresto 49/51 mg p.o. twice daily Increase Bumex to 1 mg p.o. twice daily Medications reconciled.  He is really encouraged on the importance of bringing cardiac medication bottles or list with him to accurately complete medication reconciliation. Recent hospitalization records including discharge summary, progress notes, reviewed Patient has been hospitalized at least twice since last office visit. His weight on the last discharge was 107.4 kg compared to today it is 100.7kg.   Dyspnea on exertion: Multifactorial given his lung sarcoidosis, HFpEF, URI symptoms, and tobacco use. Patient's pulse ox during today's office visit was as high as 88%. Patient states that he feels better than his prior hospitalizations.  I have asked him to go to the hospital if his pulse ox were to be less than 88% for further evaluation and management.  Patient verbalizes understanding. Educated on the importance of low-salt diet and fluid restrictions up to 1200 cc  Benign essential hypertension: Medications reconciled. Low-salt diet recommended.  Tobacco use: Patient states that he stopped smoking approximately 1 week ago. He is congratulated on his efforts. Will reevaluate at the next office visit.  FINAL MEDICATION LIST END OF ENCOUNTER: Meds ordered this encounter   Medications   sacubitril-valsartan (ENTRESTO) 49-51 MG    Sig: Take 1 tablet by mouth 2 (two) times daily.    Dispense:  180 tablet    Refill:  0   bumetanide (BUMEX) 1 MG tablet    Sig: Take 1 tablet (1 mg total) by mouth 2 (two) times daily.    Dispense:  180 tablet    Refill:  0     Medications Discontinued During This Encounter  Medication Reason   docusate sodium (COLACE) 100 MG capsule Error   Chlorphen-Phenyleph-ASA (ALKA-SELTZER PLUS COLD PO) Error   sacubitril-valsartan (ENTRESTO) 49-51 MG Reorder   bumetanide (BUMEX) 1 MG tablet Reorder     Current Outpatient Medications:    aspirin 81 MG tablet, Take 1 tablet (81 mg total) by mouth daily., Disp: , Rfl:    atorvastatin (LIPITOR) 20 MG tablet, Take 20 mg by mouth daily., Disp: , Rfl:    BLACK ELDERBERRY PO, Take 2 each by mouth daily as needed (immune support). Gummies, Disp: , Rfl:    dapagliflozin propanediol (FARXIGA) 10 MG TABS tablet, Take 1 tablet (10 mg total) by mouth daily., Disp: 30 tablet, Rfl: 0   diltiazem (CARDIZEM CD) 360 MG 24 hr capsule, Take 360 mg by mouth daily., Disp: , Rfl:    diphenhydrAMINE (BENADRYL) 25 mg capsule, Take 25 mg by mouth every 6 (six) hours as needed., Disp: , Rfl:    fluticasone (FLONASE) 50 MCG/ACT nasal spray, Place 2 sprays into both nostrils daily as needed for allergies or rhinitis., Disp: , Rfl:    isosorbide-hydrALAZINE (BIDIL) 20-37.5 MG tablet, Take 1 tablet by mouth 3 (three) times daily., Disp: 90 tablet, Rfl: 3   labetalol (NORMODYNE) 300 MG tablet, Take 300 mg by mouth 3 (three) times daily., Disp: , Rfl:    predniSONE (DELTASONE) 5 MG tablet, Take 5 mg by mouth daily. Reported  on 11/02/2015, Disp: , Rfl:    tamsulosin (FLOMAX) 0.4 MG CAPS capsule, Take 0.4 mg by mouth daily., Disp: , Rfl:    traZODone (DESYREL) 150 MG tablet, Take 1 tablet (150 mg total) by mouth at bedtime as needed for sleep., Disp: 90 tablet, Rfl: 3   bumetanide (BUMEX) 1 MG tablet, Take 1 tablet (1 mg  total) by mouth 2 (two) times daily., Disp: 180 tablet, Rfl: 0   sacubitril-valsartan (ENTRESTO) 49-51 MG, Take 1 tablet by mouth 2 (two) times daily., Disp: 180 tablet, Rfl: 0  Orders Placed This Encounter  Procedures   Basic metabolic panel   Magnesium   Pro b natriuretic peptide (BNP)     There are no Patient Instructions on file for this visit.  --Continue cardiac medications as reconciled in final medication list. --Return in about 4 weeks (around 05/15/2021) for Follow up, heart failure management.. Or sooner if needed. --Continue follow-up with your primary care physician regarding the management of your other chronic comorbid conditions.  Patient's questions and concerns were addressed to his satisfaction. He voices understanding of the instructions provided during this encounter.   This note was created using a voice recognition software as a result there may be grammatical errors inadvertently enclosed that do not reflect the nature of this encounter. Every attempt is made to correct such errors.  Today's encounter was approximately 40 minutes which included discussion with the patient today with regards to the management of acute heart failure, medication compliance, diet modifications, reviewed hospitalization records including H&P/progress notes/diagnostic testing results/labs/discharge summary.  Rex Kras, [place], [facility]  Pager: [phone] Office: [phone]

## 2021-04-17 NOTE — ED Notes (Signed)
Provider at bedside at this time

## 2021-04-18 LAB — BASIC METABOLIC PANEL
Anion gap: 6 (ref 5–15)
BUN: 16 mg/dL (ref 8–23)
CO2: 29 mmol/L (ref 22–32)
Calcium: 8.4 mg/dL — ABNORMAL LOW (ref 8.9–10.3)
Chloride: 101 mmol/L (ref 98–111)
Creatinine, Ser: 1.8 mg/dL — ABNORMAL HIGH (ref 0.61–1.24)
GFR, Estimated: 42 mL/min — ABNORMAL LOW (ref >60)
Glucose, Bld: 110 mg/dL — ABNORMAL HIGH (ref 70–99)
Potassium: 5.8 mmol/L — ABNORMAL HIGH (ref 3.5–5.1)
Sodium: 136 mmol/L (ref 135–145)

## 2021-04-18 LAB — CBC
HCT: 35.7 % — ABNORMAL LOW (ref 39.0–52.0)
Hemoglobin: 11.4 g/dL — ABNORMAL LOW (ref 13.0–17.0)
MCH: 29.7 pg (ref 26.0–34.0)
MCHC: 31.9 g/dL (ref 30.0–36.0)
MCV: 93 fL (ref 80.0–100.0)
Platelets: 231 10*3/uL (ref 150–400)
RBC: 3.84 MIL/uL — ABNORMAL LOW (ref 4.22–5.81)
RDW: 16.1 % — ABNORMAL HIGH (ref 11.5–15.5)
WBC: 7.7 10*3/uL (ref 4.0–10.5)
nRBC: 0 % (ref 0.0–0.2)

## 2021-04-18 LAB — TROPONIN I (HIGH SENSITIVITY): Troponin I (High Sensitivity): 14 ng/L (ref <18)

## 2021-04-18 LAB — PROCALCITONIN: Procalcitonin: <0.1 ng/mL

## 2021-04-18 MED ORDER — PREDNISONE 5 MG PO TABS
5.0000 mg | ORAL_TABLET | Freq: Every day | ORAL | Status: DC
  Administered 2021-04-19: 5 mg via ORAL
  Filled 2021-04-18: qty 1

## 2021-04-18 MED ORDER — BUMETANIDE 0.25 MG/ML IJ SOLN
2.0000 mg | Freq: Two times a day (BID) | INTRAMUSCULAR | Status: DC
  Administered 2021-04-18 – 2021-04-19 (×2): 2 mg via INTRAVENOUS
  Filled 2021-04-18 (×2): qty 8

## 2021-04-18 MED ORDER — PREDNISONE 5 MG PO TABS: 5.0000 mg | ORAL_TABLET | Freq: Every day | ORAL | Status: DC

## 2021-04-18 MED ORDER — METHYLPREDNISOLONE SODIUM SUCC 125 MG IJ SOLR
60.0000 mg | INTRAMUSCULAR | Status: AC
  Administered 2021-04-18: 60 mg via INTRAVENOUS
  Filled 2021-04-18: qty 2

## 2021-04-18 MED ORDER — IPRATROPIUM-ALBUTEROL 0.5-2.5 (3) MG/3ML IN SOLN
3.0000 mL | Freq: Four times a day (QID) | RESPIRATORY_TRACT | Status: DC
  Administered 2021-04-18 (×3): 3 mL via RESPIRATORY_TRACT
  Filled 2021-04-18 (×3): qty 3

## 2021-04-18 MED ORDER — IPRATROPIUM-ALBUTEROL 0.5-2.5 (3) MG/3ML IN SOLN
RESPIRATORY_TRACT | Status: AC
  Administered 2021-04-18: 3 mL via RESPIRATORY_TRACT
  Filled 2021-04-18: qty 3

## 2021-04-18 MED ORDER — ENOXAPARIN SODIUM 40 MG/0.4ML IJ SOSY
40.0000 mg | PREFILLED_SYRINGE | INTRAMUSCULAR | Status: DC
  Administered 2021-04-18 – 2021-04-23 (×6): 40 mg via SUBCUTANEOUS
  Filled 2021-04-18 (×7): qty 0.4

## 2021-04-18 MED ORDER — IPRATROPIUM-ALBUTEROL 0.5-2.5 (3) MG/3ML IN SOLN
3.0000 mL | Freq: Three times a day (TID) | RESPIRATORY_TRACT | Status: DC
  Administered 2021-04-19 – 2021-04-20 (×3): 3 mL via RESPIRATORY_TRACT
  Filled 2021-04-18 (×4): qty 3

## 2021-04-18 NOTE — ED Notes (Signed)
resp called and made aware of admit status

## 2021-04-18 NOTE — Progress Notes (Signed)
Unable to complete admission required documentation due to pt being on BiPap.

## 2021-04-18 NOTE — Progress Notes (Signed)
   04/18/21 0204  Assess: MEWS Score  Temp (!) 100.4 F (38 C)  BP (!) 170/106  Pulse Rate 76  ECG Heart Rate 80  Resp (!) 35  SpO2 96 %  Assess: MEWS Score  MEWS Temp 0  MEWS Systolic 0  MEWS Pulse 0  MEWS RR 2  MEWS LOC 0  MEWS Score 2  MEWS Score Color Yellow  Assess: if the MEWS score is Yellow or Red  Were vital signs taken at a resting state? Yes  Focused Assessment Change from prior assessment (see assessment flowsheet)  Early Detection of Sepsis Score *See Row Information* Low  MEWS guidelines implemented *See Row Information* Yes  Treat  Pain Scale 0-10  Pain Score 0  Patients Stated Pain Goal 0  Take Vital Signs  Increase Vital Sign Frequency  Yellow: Q 2hr X 2 then Q 4hr X 2, if remains yellow, continue Q 4hrs  Escalate  MEWS: Escalate Yellow: discuss with charge nurse/RN and consider discussing with provider and RRT  Notify: Charge Nurse/RN  Name of Charge Nurse/RN Notified Dana, RN  Date Charge Nurse/RN Notified 04/18/21  Time Charge Nurse/RN Notified 0205  Document  Patient Outcome Stabilized after interventions  Progress note created (see row info) Yes

## 2021-04-18 NOTE — Progress Notes (Signed)
PROGRESS NOTE    [name]  [id number] DOB: [birth year] DOA: 04/17/2021 PCP: [name], MD   Chief Complaint  Patient presents with   Shortness of Breath    Brief Narrative: 65 year old male with history of diastolic CHF, sarcoidosis, hypertension, hyperlipidemia, history of CVA, CKD stage IIIb, presented to the ED with shortness of breath on the day of admission was hypoxic on arrival at 70%.  He has been having productive cough, shortness of breath nasal congestion x2 days, was seen in the cardiology office with he was short of breath appears to have upper respiratory infection with symptoms, also hypoxic and sent to the ED. In the ED, work-up showed creatinine 1.78, BNP 2729 troponin negative x2, labs no leukocytosis w/ chronic anemia, d dimer 0.8, COVID-19 influenza screen negative.CXR showed  "Cardiomegaly with vascular congestion, pulmonary edema and possible small right effusion".  Last admission 8/2-8/4 for acute on chronic CHF exacerbation, acute hypoxic respiratory failure managed with IV Bumex in the [facility] cardiology discharged home  In the ED patient was placed on BiPAP, IV Bumex was given and admitted.  Subjective: Overnight low-grade fever 100.4 , tachypneic in the high 20s to 30s On bipap at 35% fio2 14/8 saturating at 96%, HR 73. Earlier was off bipapa brushed his teeth. Feels some improvement in breathing  Assessment & Plan:  Acute on chronic heart failure with preserved ejection fraction Patient needing BiPAP.  Continue on IV Bumex, blood pressure control is on Entresto ( holding today for hyper k) BiDil Farxiga, and beta-blocker.  Recent echo from July reviewed.  Monitor intake output Daily weight as below. I have consulted cardiology.  Patient not moving much air we will add bronchodilators, and give solumderol 60 mg x 1. Net IO Since Admission: No IO data has been entered for this period [04/18/21 1000]  No intake or output data in the 24 hours ending  04/18/21 1000  Filed Weights   04/17/21 1920 04/18/21 0600  Weight: 100.7 kg 101.2 kg      03/30/21 0707 04/06/21 0725 04/14/21 0712  PROBNP 2,643* [id number]* 3,473*   Acute hypoxemic respiratory failure: Continue BiPAP wean as tolerated.  Repeat chest x-ray today.  Low-grade fever ? URTI viral no leukocytosis, check procalcitonin and if high will add antibiotics. It seems he took dose of zeepack form pcp recently.  Sarcoidosis history on chronic prednisone 5 mg-resume from am., solumedrol  x1 today  Essential hypertension: Blood pressure stable cont his  labetalol bidil   as above  Mild Hyperkalemia: Avoid potassium supplementation.  Hold entresto today. Repeat labs in am.  CKD Stage 3b: Renal function remains close to baseline as below.  Monitor while on diuretics Recent Labs    03/23/21 0458 03/24/21 0438 03/30/21 0708 04/01/21 2303 04/06/21 0726 04/07/21 1251 04/08/21 0232 04/14/21 0712 04/17/21 1930 04/18/21 0250  BUN 28* 24* '19 15 16 15 17 14 12 16  '$ CREATININE 1.88* 1.58* 1.84* 1.73* 1.53* 1.63* 1.63* 1.79* 1.78* 1.80*    Tobacco abuse: Advised cessation-he is actively quitting smoking.  Prolonged QT interval: Monitor , avoid QT prolonging medication.    Morbid Obesity w/ BMI 30: Patient will benefit  Monitor closely.  At risk of decompensation, low threshold for ICU evaluation  Diet Order             Diet NPO time specified  Diet effective now                   Patient's Body mass index  is 30.24 kg/m.  DVT prophylaxis: lovenox Code Status:   Code Status: Full Code  Family Communication: plan of care discussed with patient at bedside. Status is: Inpatient Remains inpatient appropriate because:IV treatments appropriate due to intensity of illness or inability to take PO and Inpatient level of care appropriate due to severity of illness  Dispo: The patient is from: Home              Anticipated d/c is to: Home              Patient currently is not  medically stable to d/c.   Difficult to place patient No    Unresulted Labs (From admission, onward)    None      Medications reviewed:  Scheduled Meds:  aspirin  81 mg Oral Daily   atorvastatin  20 mg Oral Daily   bumetanide (BUMEX) IV  1 mg Intravenous Q12H   dapagliflozin propanediol  10 mg Oral Daily   diltiazem  360 mg Oral Daily   enoxaparin (LOVENOX) injection  1 mg/kg Subcutaneous Q12H   ipratropium-albuterol  3 mL Nebulization Q6H   isosorbide-hydrALAZINE  1 tablet Oral TID   labetalol  300 mg Oral TID   nicotine  7 mg Transdermal Daily   sacubitril-valsartan  1 tablet Oral BID   sodium chloride flush  3 mL Intravenous Q12H   tamsulosin  0.4 mg Oral Daily   Continuous Infusions:  sodium chloride     Consultants:see note  Procedures:see note Antimicrobials: Anti-infectives (From admission, onward)    None      Culture/Microbiology    Component Value Date/Time   SDES BLOOD RIGHT HAND 11/19/2020 1828   SPECREQUEST  11/19/2020 1828    BOTTLES DRAWN AEROBIC AND ANAEROBIC Blood Culture results may not be optimal due to an inadequate volume of blood received in culture bottles   CULT  11/19/2020 1828    NO GROWTH 5 DAYS Performed at [facility], [place] [address], [place], [place] [address]    REPTSTATUS 11/24/2020 FINAL 11/19/2020 1828    Other culture-see note  Objective: Vitals: Today's Vitals   04/18/21 0440 04/18/21 0500 04/18/21 0600 04/18/21 0753  BP: (!) 150/91 (!) 147/78 (!) 150/86   Pulse: 64 67 69 75  Resp: (!) 28 (!) 32 (!) 29 (!) 31  Temp:   99.5 F (37.5 C)   TempSrc:   Axillary   SpO2: 97% 97% 97% 96%  Weight:   101.2 kg   Height:   6' (1.829 m)   PainSc:       No intake or output data in the 24 hours ending 04/18/21 0956 Filed Weights   04/17/21 1920 04/18/21 0600  Weight: 100.7 kg 101.2 kg   Weight change:   Intake/Output from previous day: No intake/output data recorded. Intake/Output this shift: No intake/output data  recorded. Filed Weights   04/17/21 1920 04/18/21 0600  Weight: 100.7 kg 101.2 kg   Examination: General exam: AAOx3, obese, alert, comfortable on bipap HEENT:Oral mucosa moist, Ear/Nose WNL grossly,dentition normal. Respiratory system: bilaterally diminished with crackles aind wheezing, no use of accessory muscle, non tender. Cardiovascular system: S1 & S2 +,No JVD. Gastrointestinal system: Abdomen soft, NT,ND, BS+. Nervous System:Alert, awake, moving extremities Extremities: b/l LE edema +, distal peripheral pulses palpable.  Skin: No rashes,no icterus. MSK: Normal muscle bulk,tone, power Data Reviewed: I have personally reviewed following labs and imaging studies CBC: Recent Labs  Lab 04/17/21 1930 04/18/21 0250  WBC 6.8 7.7  NEUTROABS 5.3  --   HGB 12.4* 11.4*  HCT 38.6* 35.7*  MCV 92.8 93.0  PLT 246 [id number]   Basic Metabolic Panel: Recent Labs  Lab 04/14/21 0712 04/14/21 0713 04/17/21 1930 04/18/21 0250  NA 145*  --  137 136  K 3.9  --  3.4* 5.8*  CL 103  --  99 101  CO2 26  --  31 29  GLUCOSE 107*  --  120* 110*  BUN 14  --  12 16  CREATININE 1.79*  --  1.78* 1.80*  CALCIUM 9.4  --  9.0 8.4*  MG  --  2.1  --   --    GFR: Estimated Creatinine Clearance: 51 mL/min (A) (by C-G formula based on SCr of 1.8 mg/dL (H)). Liver Function Tests: Recent Labs  Lab 04/17/21 1930  AST 27  ALT 25  ALKPHOS 83  BILITOT 0.5  PROT 6.6  ALBUMIN 3.2*   No results for input(s): LIPASE, AMYLASE in the last 168 hours. No results for input(s): AMMONIA in the last 168 hours. Coagulation Profile: No results for input(s): INR, PROTIME in the last 168 hours. Cardiac Enzymes: No results for input(s): CKTOTAL, CKMB, CKMBINDEX, TROPONINI in the last 168 hours. BNP (last 3 results) Recent Labs    03/30/21 0707 04/06/21 0725 04/14/21 0712  PROBNP 2,643* 5,653* 3,473*   HbA1C: No results for input(s): HGBA1C in the last 72 hours. CBG: No results for input(s): GLUCAP in the  last 168 hours. Lipid Profile: No results for input(s): CHOL, HDL, LDLCALC, TRIG, CHOLHDL, LDLDIRECT in the last 72 hours. Thyroid Function Tests: No results for input(s): TSH, T4TOTAL, FREET4, T3FREE, THYROIDAB in the last 72 hours. Anemia Panel: No results for input(s): VITAMINB12, FOLATE, FERRITIN, TIBC, IRON, RETICCTPCT in the last 72 hours. Sepsis Labs: No results for input(s): PROCALCITON, LATICACIDVEN in the last 168 hours.  Recent Results (from the past 240 hour(s))  Resp Panel by RT-PCR (Flu A&B, Covid) Nasopharyngeal Swab     Status: None   Collection Time: 04/17/21  7:45 PM   Specimen: Nasopharyngeal Swab; Nasopharyngeal(NP) swabs in vial transport medium  Result Value Ref Range Status   SARS Coronavirus 2 by RT PCR NEGATIVE NEGATIVE Final    Comment: (NOTE) SARS-CoV-2 target nucleic acids are NOT DETECTED.  The SARS-CoV-2 RNA is generally detectable in upper respiratory specimens during the acute phase of infection. The lowest concentration of SARS-CoV-2 viral copies this assay can detect is 138 copies/mL. A negative result does not preclude SARS-Cov-2 infection and should not be used as the sole basis for treatment or other patient management decisions. A negative result may occur with  improper specimen collection/handling, submission of specimen other than nasopharyngeal swab, presence of viral mutation(s) within the areas targeted by this assay, and inadequate number of viral copies(<138 copies/mL). A negative result must be combined with clinical observations, patient history, and epidemiological information. The expected result is Negative.  Fact Sheet for Patients:  EntrepreneurPulse.com.au  Fact Sheet for Healthcare Providers:  IncredibleEmployment.be  This test is no t yet approved or cleared by the [place] FDA and  has been authorized for detection and/or diagnosis of SARS-CoV-2 by FDA under an Emergency Use  Authorization (EUA). This EUA will remain  in effect (meaning this test can be used) for the duration of the COVID-19 declaration under Section 564(b)(1) of the Act, 21 U.S.C.section 360bbb-3(b)(1), unless the authorization is terminated  or revoked sooner.       Influenza A by PCR NEGATIVE  NEGATIVE Final   Influenza B by PCR NEGATIVE NEGATIVE Final    Comment: (NOTE) The Xpert Xpress SARS-CoV-2/FLU/RSV plus assay is intended as an aid in the diagnosis of influenza from Nasopharyngeal swab specimens and should not be used as a sole basis for treatment. Nasal washings and aspirates are unacceptable for Xpert Xpress SARS-CoV-2/FLU/RSV testing.  Fact Sheet for Patients: EntrepreneurPulse.com.au  Fact Sheet for Healthcare Providers: IncredibleEmployment.be  This test is not yet approved or cleared by the [place] FDA and has been authorized for detection and/or diagnosis of SARS-CoV-2 by FDA under an Emergency Use Authorization (EUA). This EUA will remain in effect (meaning this test can be used) for the duration of the COVID-19 declaration under Section 564(b)(1) of the Act, 21 U.S.C. section 360bbb-3(b)(1), unless the authorization is terminated or revoked.  Performed at [facility], [place] [address] [place], [place] [address]      Radiology Studies: DG Chest Portable 1 View  Result Date: 04/17/2021 CLINICAL DATA:  Shortness of breath EXAM: PORTABLE CHEST 1 VIEW COMPARISON:  04/07/2021 FINDINGS: Cardiomegaly with vascular congestion and pulmonary edema. Probable small right-sided pleural effusion. No pneumothorax. IMPRESSION: Cardiomegaly with vascular congestion, pulmonary edema and possible small right effusion Electronically Signed   By: Donavan Foil M.D.   On: 04/17/2021 21:18     LOS: 1 day   Antonieta Pert, MD [facility]  04/18/2021, 9:56 AM

## 2021-04-18 NOTE — Consult Note (Signed)
CARDIOLOGY CONSULT NOTE  Patient ID: [name] MRN: [id number] DOB/AGE: April 22, 1956 65 y.o.  Admit date: 04/17/2021 Referring Physician: [facility] Primary Physician:  [name], MD Reason for Consultation:  Heart failure exacerbation  HPI:   65 y/o [place] American male with hypertension, sarcoidosis, tobacco dependence, h/o PE, OSA not on CPAP, MSSA Bacteremia (March 2022)  Patient was seen in the office on 04/17/2021 by my partner Dr. Terri Skains. There has been concern about compliance at baseline. Patient has had recurrent hospitalizations with dyspnea and heart failure exacerbation. He was last discharged on 03/09/2021, was treated with IV bumex then. He presented to the hospital on 8/12 night with worsening dyspnea and orthopnea, cough with clear sputum. In the ER, he was placed on BiPAP due to hypoxia. Trops negative, BNP elevated at 2729. Chest Xray showed pulmonary edema. D-dimer was minimally elevated at 0.82. He has had 100.4 F temp.    Past Medical History:  Diagnosis Date   Back pain    CKD (chronic kidney disease), stage III (HCC)    CVA (cerebral infarction)    Diastolic dysfunction    ED (erectile dysfunction)    GERD (gastroesophageal reflux disease)    Hypercalcemia    Hyperlipidemia    Hypertension    Insomnia    Long-term use of high-risk medication    Nephrocalcinosis    Nephrolithiasis    Obesity    Osteopenia    Pancreatitis    PE (pulmonary embolism)    Proteinuria    Sarcoidosis    Smoker      Past Surgical History:  Procedure Laterality Date   ABDOMINAL EXPLORATION SURGERY     BACK SURGERY     CHOLECYSTECTOMY     SHOULDER ARTHROSCOPY Right 10/30/2020   Procedure: ARTHROSCOPY SHOULDER WITH EXTENSIVE DEBRIDEMENT;  Surgeon: Mcarthur Rossetti, MD;  Location: [facility];  Service: Orthopedics;  Laterality: Right;   SHOULDER SURGERY     TRACHEOSTOMY     closed      Family History  Problem Relation Age of Onset    Hypertension Father    CVA Father    Lung cancer Father    Alzheimer's disease Mother    Hypertension Sister    Heart failure Sister      Social History: Social History   Socioeconomic History   Marital status: Married    Spouse name: Not on file   Number of children: 2   Years of education: Not on file   Highest education level: Not on file  Occupational History   Not on file  Tobacco Use   Smoking status: Former    Packs/day: 0.25    Years: 15.00    Pack years: 3.75    Types: Cigarettes    Start date: 11/27/2020    Quit date: 12/30/2020    Years since quitting: 0.2   Smokeless tobacco: Never  Vaping Use   Vaping Use: Never used  Substance and Sexual Activity   Alcohol use: No    Alcohol/week: 0.0 standard drinks   Drug use: No   Sexual activity: Yes  Other Topics Concern   Not on file  Social History Narrative   Not on file   Social Determinants of Health   Financial Resource Strain: Not on file  Food Insecurity: Not on file  Transportation Needs: Not on file  Physical Activity: Not on file  Stress: Not on file  Social Connections: Not on file  Intimate Partner Violence: Not on file  Medications Prior to Admission  Medication Sig Dispense Refill Last Dose   aspirin 81 MG tablet Take 1 tablet (81 mg total) by mouth daily.   04/17/2021   atorvastatin (LIPITOR) 20 MG tablet Take 20 mg by mouth daily.   04/16/2021   BLACK ELDERBERRY PO Take 2 each by mouth daily as needed (immune support). Gummies   04/16/2021   bumetanide (BUMEX) 1 MG tablet Take 1 tablet (1 mg total) by mouth 2 (two) times daily. 180 tablet 0 04/17/2021   dapagliflozin propanediol (FARXIGA) 10 MG TABS tablet Take 1 tablet (10 mg total) by mouth daily. 30 tablet 0 04/17/2021   diltiazem (CARDIZEM CD) 360 MG 24 hr capsule Take 360 mg by mouth daily.   04/17/2021   fluticasone (FLONASE) 50 MCG/ACT nasal spray Place 2 sprays into both nostrils daily as needed for allergies or rhinitis.   Past Week    isosorbide-hydrALAZINE (BIDIL) 20-37.5 MG tablet Take 1 tablet by mouth 3 (three) times daily. 90 tablet 3 04/16/2021   labetalol (NORMODYNE) 300 MG tablet Take 300 mg by mouth 3 (three) times daily.   04/17/2021 at [place] (ENTRESTO) 49-51 MG Take 1 tablet by mouth 2 (two) times daily. 180 tablet 0 04/16/2021   tamsulosin (FLOMAX) 0.4 MG CAPS capsule Take 0.4 mg by mouth daily.   04/17/2021   traZODone (DESYREL) 150 MG tablet Take 1 tablet (150 mg total) by mouth at bedtime as needed for sleep. 90 tablet 3 04/16/2021    Review of Systems  Constitutional: Negative for decreased appetite, malaise/fatigue, weight gain and weight loss.  HENT:  Negative for congestion.   Eyes:  Negative for visual disturbance.  Cardiovascular:  Positive for dyspnea on exertion. Negative for chest pain, leg swelling, palpitations and syncope.  Respiratory:  Positive for cough and shortness of breath.   Endocrine: Negative for cold intolerance.  Hematologic/Lymphatic: Does not bruise/bleed easily.  Skin:  Negative for itching and rash.  Musculoskeletal:  Negative for myalgias.  Gastrointestinal:  Negative for abdominal pain, nausea and vomiting.  Genitourinary:  Negative for dysuria.  Neurological:  Negative for dizziness and weakness.  Psychiatric/Behavioral:  The patient is not nervous/anxious.   All other systems reviewed and are negative.    Physical Exam: Physical Exam Vitals and nursing note reviewed.  Constitutional:      General: He is not in acute distress.    Appearance: He is well-developed.  HENT:     Head: Normocephalic and atraumatic.  Eyes:     Conjunctiva/sclera: Conjunctivae normal.     Pupils: Pupils are equal, round, and reactive to light.  Neck:     Vascular: No JVD.  Cardiovascular:     Rate and Rhythm: Normal rate and regular rhythm.     Pulses: Normal pulses and intact distal pulses.     Heart sounds: No murmur heard. Pulmonary:     Effort: Pulmonary effort  is normal.     Breath sounds: Examination of the right-lower field reveals decreased breath sounds and wheezing. Examination of the left-lower field reveals decreased breath sounds and wheezing. Decreased breath sounds and wheezing present. No rales.  Abdominal:     General: Bowel sounds are normal.     Palpations: Abdomen is soft.     Tenderness: There is no rebound.  Musculoskeletal:        General: No tenderness. Normal range of motion.     Right lower leg: No edema.     Left lower leg: No edema.  Lymphadenopathy:     Cervical: No cervical adenopathy.  Skin:    General: Skin is warm and dry.  Neurological:     Mental Status: He is alert and oriented to person, place, and time.     Cranial Nerves: No cranial nerve deficit.     Labs:   Lab Results  Component Value Date   WBC 7.7 04/18/2021   HGB 11.4 (L) 04/18/2021   HCT 35.7 (L) 04/18/2021   MCV 93.0 04/18/2021   PLT 231 04/18/2021    Recent Labs  Lab 04/17/21 1930 04/18/21 0250  NA 137 136  K 3.4* 5.8*  CL 99 101  CO2 31 29  BUN 12 16  CREATININE 1.78* 1.80*  CALCIUM 9.0 8.4*  PROT 6.6  --   BILITOT 0.5  --   ALKPHOS 83  --   ALT 25  --   AST 27  --   GLUCOSE 120* 110*    Lipid Panel     Component Value Date/Time   CHOL 111 04/09/2021 0147   TRIG 53 04/09/2021 0147   HDL 62 04/09/2021 0147   CHOLHDL 1.8 04/09/2021 0147   VLDL 11 04/09/2021 0147   LDLCALC 38 04/09/2021 0147    BNP (last 3 results) Recent Labs    04/07/21 1251 04/09/21 0147 04/17/21 1930  BNP 3,953.6* 1,645.7* 2,729.0*    HEMOGLOBIN A1C Lab Results  Component Value Date   HGBA1C 5.7 (H) 04/08/2021   MPG 116.89 04/08/2021    Cardiac Panel (last 3 results) No results for input(s): CKTOTAL, CKMB, RELINDX in the last 8760 hours.  Invalid input(s): TROPONINHS  Lab Results  Component Value Date   CKTOTAL 51 04/28/2009   CKMB 0.9 04/28/2009     TSH Recent Labs    11/19/20 0233  TSH 1.247      Radiology: DG  Chest Portable 1 View  Result Date: 04/17/2021 CLINICAL DATA:  Shortness of breath EXAM: PORTABLE CHEST 1 VIEW COMPARISON:  04/07/2021 FINDINGS: Cardiomegaly with vascular congestion and pulmonary edema. Probable small right-sided pleural effusion. No pneumothorax. IMPRESSION: Cardiomegaly with vascular congestion, pulmonary edema and possible small right effusion Electronically Signed   By: Donavan Foil M.D.   On: 04/17/2021 21:18    Scheduled Meds:  aspirin  81 mg Oral Daily   atorvastatin  20 mg Oral Daily   bumetanide (BUMEX) IV  1 mg Intravenous Q12H   dapagliflozin propanediol  10 mg Oral Daily   diltiazem  360 mg Oral Daily   enoxaparin (LOVENOX) injection  1 mg/kg Subcutaneous Q12H   ipratropium-albuterol  3 mL Nebulization Q6H   isosorbide-hydrALAZINE  1 tablet Oral TID   labetalol  300 mg Oral TID   nicotine  7 mg Transdermal Daily   sacubitril-valsartan  1 tablet Oral BID   sodium chloride flush  3 mL Intravenous Q12H   tamsulosin  0.4 mg Oral Daily   Continuous Infusions:  sodium chloride     PRN Meds:.sodium chloride, acetaminophen **OR** acetaminophen, senna-docusate, sodium chloride flush, traZODone  CARDIAC STUDIES:  EKG 04/17/2021: Sinus rhythm 75 bpm Prolonged QT interval  Echocardiogram 03/23/2021:  1. Left ventricular ejection fraction, by estimation, is 60 to 65%. The  left ventricle has normal function. The left ventricle has no regional  wall motion abnormalities. There is mild left ventricular hypertrophy.  Left ventricular diastolic parameters  are consistent with Grade I diastolic dysfunction (impaired relaxation).   2. Right ventricular systolic function is normal. The right ventricular  size is normal.  There is moderately elevated pulmonary artery systolic  pressure. The estimated right ventricular systolic pressure is 0000000 mmHg.   3. Left atrial size was severely dilated.   4. The mitral valve is normal in structure. Mild mitral valve   regurgitation. No evidence of mitral stenosis.   5. The aortic valve is calcified. Aortic valve regurgitation is mild. No  aortic stenosis is present.   6. The inferior vena cava is dilated in size with <50% respiratory  variability, suggesting right atrial pressure of 15 mmHg.   Assessment & Recommendations:  65 y/o [place] American male with hypertension, sarcoidosis, tobacco dependence, h/o PE, OSA not on CPAP, MSSA Bacteremia (11/2020), admitted with acute on chronic HFpEF  Acute on chronic HFpEF: Decompensated.  Continue Farxiga 10 mg daily,  Hold Entresto 49-51 mg bid, given K 5.8 Also on labetalol 300 mg tid, Bidil 20-37.5 mg tid, primarily for hypertension Continue IV Bumex 1 mg bid. Strict I/O, daily weights. If <1L output by 6 PM, will increase to 2 mg IV bid.  Suspect also a component of possible COPD exacerbation/atypical pneumonia. Management as per primary team.   Hypertension: As above    [name], MD Pager: [phone] Office: [phone]

## 2021-04-18 NOTE — ED Notes (Signed)
Attempted to call report and was advised that it had not been assigned as of this time

## 2021-04-18 NOTE — Progress Notes (Signed)
Pt has elevated D-dimer at 0.82. MD was notified. No new order given.

## 2021-04-19 DIAGNOSIS — J9601 Acute respiratory failure with hypoxia: Secondary | ICD-10-CM

## 2021-04-19 LAB — COMPREHENSIVE METABOLIC PANEL
ALT: 20 U/L (ref 0–44)
AST: 19 U/L (ref 15–41)
Albumin: 2.6 g/dL — ABNORMAL LOW (ref 3.5–5.0)
Alkaline Phosphatase: 62 U/L (ref 38–126)
Anion gap: 6 (ref 5–15)
BUN: 20 mg/dL (ref 8–23)
CO2: 33 mmol/L — ABNORMAL HIGH (ref 22–32)
Calcium: 8.3 mg/dL — ABNORMAL LOW (ref 8.9–10.3)
Chloride: 100 mmol/L (ref 98–111)
Creatinine, Ser: 2.03 mg/dL — ABNORMAL HIGH (ref 0.61–1.24)
GFR, Estimated: 36 mL/min — ABNORMAL LOW (ref >60)
Glucose, Bld: 104 mg/dL — ABNORMAL HIGH (ref 70–99)
Potassium: 3.6 mmol/L (ref 3.5–5.1)
Sodium: 139 mmol/L (ref 135–145)
Total Bilirubin: 0.7 mg/dL (ref 0.3–1.2)
Total Protein: 5.6 g/dL — ABNORMAL LOW (ref 6.5–8.1)

## 2021-04-19 LAB — CBC
HCT: 31.6 % — ABNORMAL LOW (ref 39.0–52.0)
Hemoglobin: 10.4 g/dL — ABNORMAL LOW (ref 13.0–17.0)
MCH: 30.6 pg (ref 26.0–34.0)
MCHC: 32.9 g/dL (ref 30.0–36.0)
MCV: 92.9 fL (ref 80.0–100.0)
Platelets: 228 10*3/uL (ref 150–400)
RBC: 3.4 MIL/uL — ABNORMAL LOW (ref 4.22–5.81)
RDW: 16 % — ABNORMAL HIGH (ref 11.5–15.5)
WBC: 5.7 10*3/uL (ref 4.0–10.5)
nRBC: 0 % (ref 0.0–0.2)

## 2021-04-19 LAB — PROCALCITONIN: Procalcitonin: <0.1 ng/mL

## 2021-04-19 MED ORDER — POLYETHYLENE GLYCOL 3350 17 G PO PACK: 17.0000 g | PACK | Freq: Every day | ORAL | Status: DC

## 2021-04-19 MED ORDER — POTASSIUM CHLORIDE CRYS ER 20 MEQ PO TBCR
40.0000 meq | EXTENDED_RELEASE_TABLET | Freq: Once | ORAL | Status: AC
  Administered 2021-04-19: 40 meq via ORAL
  Filled 2021-04-19: qty 2

## 2021-04-19 MED ORDER — SENNOSIDES-DOCUSATE SODIUM 8.6-50 MG PO TABS
1.0000 | ORAL_TABLET | Freq: Two times a day (BID) | ORAL | Status: DC
  Administered 2021-04-19 – 2021-04-23 (×6): 1 via ORAL
  Filled 2021-04-19 (×9): qty 1

## 2021-04-19 MED ORDER — POLYETHYLENE GLYCOL 3350 17 G PO PACK
17.0000 g | PACK | Freq: Every day | ORAL | Status: AC
Start: 2021-04-19 — End: 2021-04-22
  Filled 2021-04-19: qty 1

## 2021-04-19 MED ORDER — METHYLPREDNISOLONE SODIUM SUCC 125 MG IJ SOLR
80.0000 mg | INTRAMUSCULAR | Status: DC
Start: 2021-04-19 — End: 2021-04-21
  Administered 2021-04-19 – 2021-04-21 (×3): 80 mg via INTRAVENOUS
  Filled 2021-04-19 (×3): qty 2

## 2021-04-19 MED ORDER — GUAIFENESIN ER 600 MG PO TB12
600.0000 mg | ORAL_TABLET | Freq: Two times a day (BID) | ORAL | Status: DC
  Administered 2021-04-19 – 2021-04-23 (×9): 600 mg via ORAL
  Filled 2021-04-19 (×9): qty 1

## 2021-04-19 MED ORDER — PANTOPRAZOLE SODIUM 40 MG PO TBEC
40.0000 mg | DELAYED_RELEASE_TABLET | Freq: Every day | ORAL | Status: DC
  Administered 2021-04-19 – 2021-04-23 (×5): 40 mg via ORAL
  Filled 2021-04-19 (×5): qty 1

## 2021-04-19 MED ORDER — BUMETANIDE 0.25 MG/ML IJ SOLN
1.0000 mg | Freq: Two times a day (BID) | INTRAMUSCULAR | Status: DC
  Administered 2021-04-19 – 2021-04-20 (×3): 1 mg via INTRAVENOUS
  Filled 2021-04-19 (×4): qty 4

## 2021-04-19 NOTE — Progress Notes (Addendum)
PROGRESS NOTE    [name]  [id number] DOB: Oct 26, 1955 DOA: 04/17/2021 PCP: [name], MD   Chief Complaint  Patient presents with   Shortness of Breath    Brief Narrative: 65 year old male with history of diastolic CHF, sarcoidosis, hypertension, hyperlipidemia, history of CVA, CKD stage IIIb, presented to the ED with shortness of breath on the day of admission was hypoxic on arrival at 70%.  He has been having productive cough, shortness of breath nasal congestion x2 days, was seen in the cardiology office with he was short of breath appears to have upper respiratory infection with symptoms, also hypoxic and sent to the ED. In the ED, work-up showed creatinine 1.78, BNP 2729 troponin negative x2, labs no leukocytosis w/ chronic anemia, d dimer 0.8, COVID-19 influenza screen negative.CXR showed  "Cardiomegaly with vascular congestion, pulmonary edema and possible small right effusion".  Last admission 8/2-8/4 for acute on chronic CHF exacerbation, acute hypoxic respiratory failure managed with IV Bumex in the [facility] cardiology discharged home  In the ED patient was placed on BiPAP, IV Bumex was given and admitted.  Subjective:  Patient does report some dyspnea, congestion, reports cough present he feels congested but unable to produce phlegm, he did require BiPAP for couple hours overnight.   Assessment & Plan:  Acute on chronic heart failure with preserved ejection fraction Patient needing BiPAP.  Continue on IV Bumex, blood pressure control is on Entresto ( holding today for hyper k) BiDil Farxiga, and beta-blocker.  Recent echo from July reviewed.  Monitor intake output Daily weight as below. Neurology input greatly appreciated, continue with current dose  -Cardiology input greatly appreciated, will hold on increasing max dose given volume overload appears to be improving as well with worsening renal function. -Entresto remains on hold given AKI and hyperkalemia on  presentation.   Net IO Since Admission: -660 mL [04/19/21 1526]   Intake/Output Summary (Last 24 hours) at 04/19/2021 1526 Last data filed at 04/18/2021 2000 Gross per 24 hour  Intake 240 ml  Output 400 ml  Net -160 ml    Filed Weights   04/17/21 1920 04/18/21 0600 04/19/21 0407  Weight: 100.7 kg 101.2 kg 101.7 kg      03/30/21 0707 04/06/21 0725 04/14/21 0712  PROBNP 2,643* [id number]* 3,473*   Acute hypoxemic respiratory failure: Continue BiPAP wean as tolerated.  COPD exacerbation -Patient with diminished air entry and significant wheezing bilaterally, he will be started on scheduled duo nebs and IV Solu-Medrol.  Low-grade fever ? URTI viral no leukocytosis, check procalcitonin and if high will add antibiotics. It seems he took dose of zeepack form pcp recently.  Sarcoidosis history on chronic prednisone 5 mg-resume from am., solumedrol  x1 today  Essential hypertension: Blood pressure stable cont his  labetalol bidil   as above  Mild Hyperkalemia: Avoid potassium supplementation.  Hold entresto today. Repeat labs in am.  CKD Stage 3b: Renal function remains close to baseline as below.  Monitor while on diuretics Recent Labs    03/24/21 0438 03/30/21 0708 04/01/21 2303 04/06/21 0726 04/07/21 1251 04/08/21 0232 04/14/21 0712 04/17/21 1930 04/18/21 0250 04/19/21 0240  BUN 24* '19 15 16 15 17 14 12 16 20  '$ CREATININE 1.58* 1.84* 1.73* 1.53* 1.63* 1.63* 1.79* 1.78* 1.80* 2.03*    Tobacco abuse: Advised cessation-he is actively quitting smoking.  Prolonged QT interval: Monitor , avoid QT prolonging medication.    Morbid Obesity w/ BMI 30: Patient will benefit  Monitor closely.  At risk of  decompensation, low threshold for ICU evaluation  Diet Order             Diet Heart Room service appropriate? Yes; Fluid consistency: Thin  Diet effective now                   Patient's Body mass index is 30.41 kg/m.  DVT prophylaxis: enoxaparin (LOVENOX) injection 40  mg Start: 04/18/21 1215lovenox Code Status:   Code Status: Full Code  Family Communication: plan of care discussed with patient at bedside. Status is: Inpatient Remains inpatient appropriate because:IV treatments appropriate due to intensity of illness or inability to take PO and Inpatient level of care appropriate due to severity of illness  Dispo: The patient is from: Home              Anticipated d/c is to: Home              Patient currently is not medically stable to d/c.   Difficult to place patient No    Unresulted Labs (From admission, onward)     Start     Ordered   04/19/21 0500  Procalcitonin  Daily,   R      04/18/21 1044           Medications reviewed:  Scheduled Meds:  aspirin  81 mg Oral Daily   atorvastatin  20 mg Oral Daily   bumetanide (BUMEX) IV  1 mg Intravenous Q12H   dapagliflozin propanediol  10 mg Oral Daily   diltiazem  360 mg Oral Daily   enoxaparin (LOVENOX) injection  40 mg Subcutaneous Q24H   guaiFENesin  600 mg Oral BID   ipratropium-albuterol  3 mL Nebulization TID   isosorbide-hydrALAZINE  1 tablet Oral TID   labetalol  300 mg Oral TID   methylPREDNISolone (SOLU-MEDROL) injection  80 mg Intravenous Q24H   nicotine  7 mg Transdermal Daily   pantoprazole  40 mg Oral Daily   polyethylene glycol  17 g Oral Daily   senna-docusate  1 tablet Oral BID   sodium chloride flush  3 mL Intravenous Q12H   tamsulosin  0.4 mg Oral Daily   Continuous Infusions:  sodium chloride     Consultants:see note  Procedures:see note Antimicrobials: Anti-infectives (From admission, onward)    None      Culture/Microbiology    Component Value Date/Time   SDES BLOOD RIGHT HAND 11/19/2020 1828   SPECREQUEST  11/19/2020 1828    BOTTLES DRAWN AEROBIC AND ANAEROBIC Blood Culture results may not be optimal due to an inadequate volume of blood received in culture bottles   CULT  11/19/2020 1828    NO GROWTH 5 DAYS Performed at [facility], [place]  [address] [place], [place] [address]    REPTSTATUS 11/24/2020 FINAL 11/19/2020 1828    Other culture-see note  Objective: Vitals: Today's Vitals   04/19/21 0731 04/19/21 0746 04/19/21 0815 04/19/21 1118  BP:  132/84  125/85  Pulse:  62  62  Resp:  (!) 23  14  Temp:  97.7 F (36.5 C)  97.9 F (36.6 C)  TempSrc:  Oral  Oral  SpO2: 95% 94%  95%  Weight:      Height:      PainSc:   0-No pain     Intake/Output Summary (Last 24 hours) at 04/19/2021 1526 Last data filed at 04/18/2021 2000 Gross per 24 hour  Intake 240 ml  Output 400 ml  Net -160 ml   Filed  Weights   04/17/21 1920 04/18/21 0600 04/19/21 0407  Weight: 100.7 kg 101.2 kg 101.7 kg   Weight change: 0.996 kg  Intake/Output from previous day: 08/13 0701 - 08/14 0700 In: 240 [P.O.:240] Out: 900 [Urine:900] Intake/Output this shift: No intake/output data recorded. Filed Weights   04/17/21 1920 04/18/21 0600 04/19/21 0407  Weight: 100.7 kg 101.2 kg 101.7 kg   Examination:  Awake Alert, Oriented X 3, No new F.N deficits, Normal affect Symmetrical Chest wall movement, and with diffuse wheezing bilaterally and diminished air entry RRR,No Gallops,Rubs or new Murmurs, No Parasternal Heave +ve B.Sounds, Abd Soft, No tenderness, No rebound - guarding or rigidity. No Cyanosis, Clubbing , has trace edema, No new Rash or bruise     Data Reviewed: I have personally reviewed following labs and imaging studies CBC: Recent Labs  Lab 04/17/21 1930 04/18/21 0250 04/19/21 0240  WBC 6.8 7.7 5.7  NEUTROABS 5.3  --   --   HGB 12.4* 11.4* 10.4*  HCT 38.6* 35.7* 31.6*  MCV 92.8 93.0 92.9  PLT 246 231 [id number]   Basic Metabolic Panel: Recent Labs  Lab 04/14/21 0712 04/14/21 0713 04/17/21 1930 04/18/21 0250 04/19/21 0240  NA 145*  --  137 136 139  K 3.9  --  3.4* 5.8* 3.6  CL 103  --  99 101 100  CO2 26  --  31 29 33*  GLUCOSE 107*  --  120* 110* 104*  BUN 14  --  '12 16 20  '$ CREATININE 1.79*  --  1.78* 1.80* 2.03*  CALCIUM  9.4  --  9.0 8.4* 8.3*  MG  --  2.1  --   --   --    GFR: Estimated Creatinine Clearance: 45.3 mL/min (A) (by C-G formula based on SCr of 2.03 mg/dL (H)). Liver Function Tests: Recent Labs  Lab 04/17/21 1930 04/19/21 0240  AST 27 19  ALT 25 20  ALKPHOS 83 62  BILITOT 0.5 0.7  PROT 6.6 5.6*  ALBUMIN 3.2* 2.6*   No results for input(s): LIPASE, AMYLASE in the last 168 hours. No results for input(s): AMMONIA in the last 168 hours. Coagulation Profile: No results for input(s): INR, PROTIME in the last 168 hours. Cardiac Enzymes: No results for input(s): CKTOTAL, CKMB, CKMBINDEX, TROPONINI in the last 168 hours. BNP (last 3 results) Recent Labs    03/30/21 0707 04/06/21 0725 04/14/21 0712  PROBNP 2,643* 5,653* 3,473*   HbA1C: No results for input(s): HGBA1C in the last 72 hours. CBG: No results for input(s): GLUCAP in the last 168 hours. Lipid Profile: No results for input(s): CHOL, HDL, LDLCALC, TRIG, CHOLHDL, LDLDIRECT in the last 72 hours. Thyroid Function Tests: No results for input(s): TSH, T4TOTAL, FREET4, T3FREE, THYROIDAB in the last 72 hours. Anemia Panel: No results for input(s): VITAMINB12, FOLATE, FERRITIN, TIBC, IRON, RETICCTPCT in the last 72 hours. Sepsis Labs: Recent Labs  Lab 04/18/21 0250 04/19/21 0240  PROCALCITON <0.10 <0.10    Recent Results (from the past 240 hour(s))  Resp Panel by RT-PCR (Flu A&B, Covid) Nasopharyngeal Swab     Status: None   Collection Time: 04/17/21  7:45 PM   Specimen: Nasopharyngeal Swab; Nasopharyngeal(NP) swabs in vial transport medium  Result Value Ref Range Status   SARS Coronavirus 2 by RT PCR NEGATIVE NEGATIVE Final    Comment: (NOTE) SARS-CoV-2 target nucleic acids are NOT DETECTED.  The SARS-CoV-2 RNA is generally detectable in upper respiratory specimens during the acute phase of infection. The lowest concentration of  SARS-CoV-2 viral copies this assay can detect is 138 copies/mL. A negative result does not  preclude SARS-Cov-2 infection and should not be used as the sole basis for treatment or other patient management decisions. A negative result may occur with  improper specimen collection/handling, submission of specimen other than nasopharyngeal swab, presence of viral mutation(s) within the areas targeted by this assay, and inadequate number of viral copies(<138 copies/mL). A negative result must be combined with clinical observations, patient history, and epidemiological information. The expected result is Negative.  Fact Sheet for Patients:  EntrepreneurPulse.com.au  Fact Sheet for Healthcare Providers:  IncredibleEmployment.be  This test is no t yet approved or cleared by the [place] FDA and  has been authorized for detection and/or diagnosis of SARS-CoV-2 by FDA under an Emergency Use Authorization (EUA). This EUA will remain  in effect (meaning this test can be used) for the duration of the COVID-19 declaration under Section 564(b)(1) of the Act, 21 U.S.C.section 360bbb-3(b)(1), unless the authorization is terminated  or revoked sooner.       Influenza A by PCR NEGATIVE NEGATIVE Final   Influenza B by PCR NEGATIVE NEGATIVE Final    Comment: (NOTE) The Xpert Xpress SARS-CoV-2/FLU/RSV plus assay is intended as an aid in the diagnosis of influenza from Nasopharyngeal swab specimens and should not be used as a sole basis for treatment. Nasal washings and aspirates are unacceptable for Xpert Xpress SARS-CoV-2/FLU/RSV testing.  Fact Sheet for Patients: EntrepreneurPulse.com.au  Fact Sheet for Healthcare Providers: IncredibleEmployment.be  This test is not yet approved or cleared by the [place] FDA and has been authorized for detection and/or diagnosis of SARS-CoV-2 by FDA under an Emergency Use Authorization (EUA). This EUA will remain in effect (meaning this test can be used) for the  duration of the COVID-19 declaration under Section 564(b)(1) of the Act, 21 U.S.C. section 360bbb-3(b)(1), unless the authorization is terminated or revoked.  Performed at [facility], [place] [address] [place], [place] [address]      Radiology Studies: DG Chest Portable 1 View  Result Date: 04/17/2021 CLINICAL DATA:  Shortness of breath EXAM: PORTABLE CHEST 1 VIEW COMPARISON:  04/07/2021 FINDINGS: Cardiomegaly with vascular congestion and pulmonary edema. Probable small right-sided pleural effusion. No pneumothorax. IMPRESSION: Cardiomegaly with vascular congestion, pulmonary edema and possible small right effusion Electronically Signed   By: Donavan Foil M.D.   On: 04/17/2021 21:18     LOS: 2 days   Phillips Climes, MD [facility]  04/19/2021, 3:26 PM

## 2021-04-19 NOTE — Progress Notes (Addendum)
Subjective:  Breathing improved  Cr increased from 1.8 to 2.0 Also on steroids and bronchodilators  Objective:  Vital Signs in the last 24 hours: Temp:  [97.7 F (36.5 C)-98.8 F (37.1 C)] 97.9 F (36.6 C) (08/14 1118) Pulse Rate:  [60-79] 62 (08/14 1118) Resp:  [14-36] 14 (08/14 1118) BP: (114-179)/(64-111) 125/85 (08/14 1118) SpO2:  [90 %-99 %] 95 % (08/14 1118) Weight:  [101.7 kg] 101.7 kg (08/14 0407)  Intake/Output from previous day: 08/13 0701 - 08/14 0700 In: 240 [P.O.:240] Out: 900 [Urine:900]  Physical Exam Vitals and nursing note reviewed.  Constitutional:      General: He is not in acute distress.    Appearance: He is well-developed.  HENT:     Head: Normocephalic and atraumatic.  Eyes:     Conjunctiva/sclera: Conjunctivae normal.     Pupils: Pupils are equal, round, and reactive to light.  Neck:     Vascular: No JVD.  Cardiovascular:     Rate and Rhythm: Normal rate and regular rhythm.     Pulses: Normal pulses and intact distal pulses.     Heart sounds: No murmur heard. Pulmonary:     Effort: Pulmonary effort is normal.     Breath sounds: Wheezing present. No rales.     Comments: On 3 L O2 Abdominal:     General: Bowel sounds are normal.     Palpations: Abdomen is soft.     Tenderness: There is no rebound.  Musculoskeletal:        General: No tenderness. Normal range of motion.     Right lower leg: No edema.     Left lower leg: No edema.  Lymphadenopathy:     Cervical: No cervical adenopathy.  Skin:    General: Skin is warm and dry.  Neurological:     Mental Status: He is alert and oriented to person, place, and time.     Cranial Nerves: No cranial nerve deficit.     Lab Results: BMP Recent Labs    04/17/21 1930 04/18/21 0250 04/19/21 0240  NA 137 136 139  K 3.4* 5.8* 3.6  CL 99 101 100  CO2 31 29 33*  GLUCOSE 120* 110* 104*  BUN '12 16 20  '$ CREATININE 1.78* 1.80* 2.03*  CALCIUM 9.0 8.4* 8.3*  GFRNONAA 42* 42* 36*     CBC Recent Labs  Lab 04/17/21 1930 04/18/21 0250 04/19/21 0240  WBC 6.8   < > 5.7  RBC 4.16*   < > 3.40*  HGB 12.4*   < > 10.4*  HCT 38.6*   < > 31.6*  PLT 246   < > 228  MCV 92.8   < > 92.9  MCH 29.8   < > 30.6  MCHC 32.1   < > 32.9  RDW 16.0*   < > 16.0*  LYMPHSABS 0.5*  --   --   MONOABS 0.5  --   --   EOSABS 0.4  --   --   BASOSABS 0.0  --   --    < > = values in this interval not displayed.    HEMOGLOBIN A1C Lab Results  Component Value Date   HGBA1C 5.7 (H) 04/08/2021   MPG 116.89 04/08/2021    Cardiac Panel (last 3 results) No results for input(s): CKTOTAL, CKMB, TROPONINI, RELINDX in the last 8760 hours.  BNP (last 3 results) Recent Labs    04/07/21 1251 04/09/21 0147 04/17/21 1930  BNP 3,953.6* 1,645.7* 2,729.0*    TSH  Recent Labs    11/19/20 0233  TSH 1.247    Lipid Panel     Component Value Date/Time   CHOL 111 04/09/2021 0147   TRIG 53 04/09/2021 0147   HDL 62 04/09/2021 0147   CHOLHDL 1.8 04/09/2021 0147   VLDL 11 04/09/2021 0147   LDLCALC 38 04/09/2021 0147     Hepatic Function Panel Recent Labs    04/01/21 2303 04/17/21 1930 04/19/21 0240  PROT 5.6* 6.6 5.6*  ALBUMIN 3.1* 3.2* 2.6*  AST 36 27 19  ALT 39 25 20  ALKPHOS 66 83 62  BILITOT 0.7 0.5 0.7    Imaging: Chest Xray 04/17/2021: Cardiomegaly with vascular congestion, pulmonary edema and possible small right effusion    Cardiac Studies:  EKG 04/17/2021: Sinus rhythm 75 bpm Prolonged QT interval   Echocardiogram 03/23/2021:  1. Left ventricular ejection fraction, by estimation, is 60 to 65%. The  left ventricle has normal function. The left ventricle has no regional  wall motion abnormalities. There is mild left ventricular hypertrophy.  Left ventricular diastolic parameters  are consistent with Grade I diastolic dysfunction (impaired relaxation).   2. Right ventricular systolic function is normal. The right ventricular  size is normal. There is moderately  elevated pulmonary artery systolic  pressure. The estimated right ventricular systolic pressure is 0000000 mmHg.   3. Left atrial size was severely dilated.   4. The mitral valve is normal in structure. Mild mitral valve  regurgitation. No evidence of mitral stenosis.   5. The aortic valve is calcified. Aortic valve regurgitation is mild. No  aortic stenosis is present.   6. The inferior vena cava is dilated in size with <50% respiratory  variability, suggesting right atrial pressure of 15 mmHg.    Assessment & Recommendations:   66 y/o [place] American male with hypertension, sarcoidosis, tobacco dependence, h/o PE, OSA not on CPAP, MSSA Bacteremia (11/2020), admitted with acute on chronic HFpEF   Acute hypoxic respiratory failure; Likely combination of acute on chronic HFpEFand possible COPD exacerbation.  Mild d-Dimer elevation on admission, but clinically less likely to be PE (Significant CXR abnormalities unlike what would be expected with PE). Cr elevated precludes CTA use. Baseline lung abnormalities, VQ scan may not help. Clinically, he has improved with steroids and bronchodilators, even without much urine putput. In hindsight, BNP elevation likely not a good correlation to heart failure exacerbation, given that he was on Entresto. ProBNP would be a better marker to check.  Cr increased from 18 to 2.0. Will back off Bumex to 1 mg bid from 2 mg bid Continue Farxiga 10 mg daily,  Hold Entresto 49-51 mg bid given AKI and hyperkalemia on presentation. Also on labetalol 300 mg tid, Bidil 20-37.5 mg tid, primarily for hypertension Strict I/O, daily weights.    Hypertension: Controlled    Manuel Mormon, MD Pager: [phone] Office: [phone]

## 2021-04-20 ENCOUNTER — Telehealth: Payer: Self-pay | Admitting: Neurology

## 2021-04-20 DIAGNOSIS — J441 Chronic obstructive pulmonary disease with (acute) exacerbation: Secondary | ICD-10-CM

## 2021-04-20 DIAGNOSIS — I10 Essential (primary) hypertension: Secondary | ICD-10-CM

## 2021-04-20 LAB — BASIC METABOLIC PANEL
Anion gap: 8 (ref 5–15)
BUN: 23 mg/dL (ref 8–23)
CO2: 30 mmol/L (ref 22–32)
Calcium: 8.5 mg/dL — ABNORMAL LOW (ref 8.9–10.3)
Chloride: 102 mmol/L (ref 98–111)
Creatinine, Ser: 1.93 mg/dL — ABNORMAL HIGH (ref 0.61–1.24)
GFR, Estimated: 38 mL/min — ABNORMAL LOW (ref >60)
Glucose, Bld: 174 mg/dL — ABNORMAL HIGH (ref 70–99)
Potassium: 3.9 mmol/L (ref 3.5–5.1)
Sodium: 140 mmol/L (ref 135–145)

## 2021-04-20 LAB — CBC
HCT: 33 % — ABNORMAL LOW (ref 39.0–52.0)
Hemoglobin: 10.6 g/dL — ABNORMAL LOW (ref 13.0–17.0)
MCH: 29.6 pg (ref 26.0–34.0)
MCHC: 32.1 g/dL (ref 30.0–36.0)
MCV: 92.2 fL (ref 80.0–100.0)
Platelets: 218 10*3/uL (ref 150–400)
RBC: 3.58 MIL/uL — ABNORMAL LOW (ref 4.22–5.81)
RDW: 15.5 % (ref 11.5–15.5)
WBC: 4.6 10*3/uL (ref 4.0–10.5)
nRBC: 0 % (ref 0.0–0.2)

## 2021-04-20 MED ORDER — MENTHOL 3 MG MT LOZG
1.0000 | LOZENGE | OROMUCOSAL | Status: DC | PRN
  Filled 2021-04-20: qty 9

## 2021-04-20 MED ORDER — IPRATROPIUM-ALBUTEROL 0.5-2.5 (3) MG/3ML IN SOLN: 3.0000 mL | Freq: Four times a day (QID) | RESPIRATORY_TRACT | Status: DC | PRN

## 2021-04-20 MED ORDER — MAGNESIUM HYDROXIDE 400 MG/5ML PO SUSP
30.0000 mL | Freq: Every day | ORAL | Status: DC | PRN
  Administered 2021-04-20: 30 mL via ORAL
  Filled 2021-04-20: qty 30

## 2021-04-20 MED ORDER — SALINE SPRAY 0.65 % NA SOLN
1.0000 | NASAL | Status: DC | PRN
  Filled 2021-04-20: qty 44

## 2021-04-20 NOTE — Telephone Encounter (Signed)
Called the patient's wife back. He has been constantly in/out of the hospital due to medical conditions. The last office visit here in April the patient was ordered to completed a split night study so patient could be set up on CPAP and fitted over night. The study appears was scheduled originally for 5/20 and then again 6/22 and both studies were cancelled due to technician was sick.  The MD at the hospital is hesitant about dc the patient home until they know he is scheduled for sleep study because the events have been so bad in hospital setting.  Advised the wife I would reach out to the sleep lab manager to have her follow up on getting the patient scheduled. She was very appreciative for the call back and information.

## 2021-04-20 NOTE — Progress Notes (Signed)
RT note. Patient currently on Rm air sat 96% w/ stable VS, no labored breathing noted at this time, not requiring bipap at this time.  V60 in patients room if needed later, RT will continue to monitor

## 2021-04-20 NOTE — Progress Notes (Signed)
PROGRESS NOTE    [name]  [id number] DOB: 1955/11/02 DOA: 04/17/2021 PCP: [name], MD   Chief Complaint  Patient presents with   Shortness of Breath    Brief Narrative: 65 year old male with history of diastolic CHF, sarcoidosis, hypertension, hyperlipidemia, history of CVA, CKD stage IIIb, presented to the ED with shortness of breath on the day of admission was hypoxic on arrival at 70%.  He has been having productive cough, shortness of breath nasal congestion x2 days, was seen in the cardiology office with he was short of breath appears to have upper respiratory infection with symptoms, also hypoxic and sent to the ED. In the ED, work-up showed creatinine 1.78, BNP 2729 troponin negative x2, labs no leukocytosis w/ chronic anemia, d dimer 0.8, COVID-19 influenza screen negative.CXR showed  "Cardiomegaly with vascular congestion, pulmonary edema and possible small right effusion".  Last admission 8/2-8/4 for acute on chronic CHF exacerbation, acute hypoxic respiratory failure managed with IV Bumex in the [facility] cardiology discharged home  In the ED patient was placed on BiPAP, IV Bumex was given and admitted.  Subjective:  Patient does report some dyspnea, congestion, reports cough present he feels congested but unable to produce phlegm, he did require BiPAP for couple hours overnight.   Assessment & Plan:  Acute on chronic heart failure with preserved ejection fraction Patient needing BiPAP.   Continue on IV Bumex, blood pressure control is on Entresto ( holding today for hyper k) BiDil Farxiga, and beta-blocker.  Recent echo from July reviewed.  Monitor intake output Daily weight as below. Neurology input greatly appreciated, continue with current dose  -Cardiology input greatly appreciated, continue  with current dose IV diuresis.   -Entresto remains on hold given AKI and hyperkalemia on presentation.   Net IO Since Admission: -2,145 mL [04/20/21 1230]    Intake/Output Summary (Last 24 hours) at 04/20/2021 1230 Last data filed at 04/20/2021 1200 Gross per 24 hour  Intake --  Output 1485 ml  Net -1485 ml    Filed Weights   04/18/21 0600 04/19/21 0407 04/20/21 0445  Weight: 101.2 kg 101.7 kg 102.5 kg      03/30/21 0707 04/06/21 0725 04/14/21 0712  PROBNP 2,643* [id number]* 3,473*   Acute hypoxemic respiratory failure:  -Required BiPAP initially, currently improved after IV diuresis and steroids, no BiPAP requirement overnight, he is on 2 L nasal cannula, will try to wean.  COPD exacerbation -Improving with IV steroids and scheduled duo nebs.    Low-grade fever ? URTI viral no leukocytosis, check procalcitonin and if high will add antibiotics. It seems he took dose of zeepack form pcp recently.  Sarcoidosis history on chronic prednisone 5 mg-resume from am.  Essential hypertension: Blood pressure stable cont his  labetalol bidil   as above  Mild Hyperkalemia: Avoid potassium supplementation.  Hold entresto today. Repeat labs in am.  CKD Stage 3b: Renal function remains close to baseline as below.  Monitor while on diuretics Recent Labs    03/30/21 0708 04/01/21 2303 04/06/21 0726 04/07/21 1251 04/08/21 0232 04/14/21 0712 04/17/21 1930 04/18/21 0250 04/19/21 0240 04/20/21 0217  BUN '19 15 16 15 17 14 12 16 20 23  '$ CREATININE 1.84* 1.73* 1.53* 1.63* 1.63* 1.79* 1.78* 1.80* 2.03* 1.93*    Tobacco abuse: Advised cessation-he is actively quitting smoking.  Prolonged QT interval: Monitor , avoid QT prolonging medication.    Morbid Obesity w/ BMI 30: Patient will benefit  Monitor closely.  At risk of decompensation, low threshold  for ICU evaluation  Diet Order             Diet Heart Room service appropriate? Yes; Fluid consistency: Thin  Diet effective now                   Patient's Body mass index is 30.64 kg/m.  DVT prophylaxis: enoxaparin (LOVENOX) injection 40 mg Start: 04/18/21 1215lovenox Code Status:    Code Status: Full Code  Family Communication: plan of care discussed with patient at bedside. Status is: Inpatient Remains inpatient appropriate because:IV treatments appropriate due to intensity of illness or inability to take PO and Inpatient level of care appropriate due to severity of illness  Dispo: The patient is from: Home              Anticipated d/c is to: Home              Patient currently is not medically stable to d/c.   Difficult to place patient No    Unresulted Labs (From admission, onward)    None      Medications reviewed:  Scheduled Meds:  aspirin  81 mg Oral Daily   atorvastatin  20 mg Oral Daily   bumetanide (BUMEX) IV  1 mg Intravenous Q12H   dapagliflozin propanediol  10 mg Oral Daily   diltiazem  360 mg Oral Daily   enoxaparin (LOVENOX) injection  40 mg Subcutaneous Q24H   guaiFENesin  600 mg Oral BID   ipratropium-albuterol  3 mL Nebulization TID   isosorbide-hydrALAZINE  1 tablet Oral TID   labetalol  300 mg Oral TID   methylPREDNISolone (SOLU-MEDROL) injection  80 mg Intravenous Q24H   nicotine  7 mg Transdermal Daily   pantoprazole  40 mg Oral Daily   polyethylene glycol  17 g Oral Daily   senna-docusate  1 tablet Oral BID   sodium chloride flush  3 mL Intravenous Q12H   tamsulosin  0.4 mg Oral Daily   Continuous Infusions:  sodium chloride     Consultants:see note  Procedures:see note Antimicrobials: Anti-infectives (From admission, onward)    None      Culture/Microbiology    Component Value Date/Time   SDES BLOOD RIGHT HAND 11/19/2020 1828   SPECREQUEST  11/19/2020 1828    BOTTLES DRAWN AEROBIC AND ANAEROBIC Blood Culture results may not be optimal due to an inadequate volume of blood received in culture bottles   CULT  11/19/2020 1828    NO GROWTH 5 DAYS Performed at [facility], [place] [address] [place], [place] [address]    REPTSTATUS 11/24/2020 FINAL 11/19/2020 1828    Other culture-see  note  Objective: Vitals: Today's Vitals   04/19/21 2152 04/20/21 0445 04/20/21 0452 04/20/21 0911  BP:  125/65 125/65 (!) 146/85  Pulse:  77  67  Resp:  (!) 22 20   Temp:  97.7 F (36.5 C)    TempSrc:  Oral    SpO2:  96%  92%  Weight:  102.5 kg    Height:      PainSc: 0-No pain       Intake/Output Summary (Last 24 hours) at 04/20/2021 1230 Last data filed at 04/20/2021 1200 Gross per 24 hour  Intake --  Output 1485 ml  Net -1485 ml   Filed Weights   04/18/21 0600 04/19/21 0407 04/20/21 0445  Weight: 101.2 kg 101.7 kg 102.5 kg   Weight change: 0.771 kg  Intake/Output from previous day: 08/14 0701 - 08/15 0700 In: -  Out: 1360 [Urine:1360] Intake/Output this shift: Total I/O In: -  Out: 125 [Urine:125] Filed Weights   04/18/21 0600 04/19/21 0407 04/20/21 0445  Weight: 101.2 kg 101.7 kg 102.5 kg   Examination:  Awake Alert, Oriented X 3, No new F.N deficits, Normal affect Symmetrical Chest wall movement, improved air entry today, wheezing has subsided RRR,No Gallops,Rubs or new Murmurs, No Parasternal Heave +ve B.Sounds, Abd Soft, No tenderness, No rebound - guarding or rigidity. No Cyanosis, Clubbing, has minimal edema, No new Rash or bruise      Data Reviewed: I have personally reviewed following labs and imaging studies CBC: Recent Labs  Lab 04/17/21 1930 04/18/21 0250 04/19/21 0240 04/20/21 0217  WBC 6.8 7.7 5.7 4.6  NEUTROABS 5.3  --   --   --   HGB 12.4* 11.4* 10.4* 10.6*  HCT 38.6* 35.7* 31.6* 33.0*  MCV 92.8 93.0 92.9 92.2  PLT 246 231 228 [id number]   Basic Metabolic Panel: Recent Labs  Lab 04/14/21 0712 04/14/21 0713 04/17/21 1930 04/18/21 0250 04/19/21 0240 04/20/21 0217  NA 145*  --  137 136 139 140  K 3.9  --  3.4* 5.8* 3.6 3.9  CL 103  --  99 101 100 102  CO2 26  --  31 29 33* 30  GLUCOSE 107*  --  120* 110* 104* 174*  BUN 14  --  '12 16 20 23  '$ CREATININE 1.79*  --  1.78* 1.80* 2.03* 1.93*  CALCIUM 9.4  --  9.0 8.4* 8.3* 8.5*  MG   --  2.1  --   --   --   --    GFR: Estimated Creatinine Clearance: 47.9 mL/min (A) (by C-G formula based on SCr of 1.93 mg/dL (H)). Liver Function Tests: Recent Labs  Lab 04/17/21 1930 04/19/21 0240  AST 27 19  ALT 25 20  ALKPHOS 83 62  BILITOT 0.5 0.7  PROT 6.6 5.6*  ALBUMIN 3.2* 2.6*   No results for input(s): LIPASE, AMYLASE in the last 168 hours. No results for input(s): AMMONIA in the last 168 hours. Coagulation Profile: No results for input(s): INR, PROTIME in the last 168 hours. Cardiac Enzymes: No results for input(s): CKTOTAL, CKMB, CKMBINDEX, TROPONINI in the last 168 hours. BNP (last 3 results) Recent Labs    03/30/21 0707 04/06/21 0725 04/14/21 0712  PROBNP 2,643* 5,653* 3,473*   HbA1C: No results for input(s): HGBA1C in the last 72 hours. CBG: No results for input(s): GLUCAP in the last 168 hours. Lipid Profile: No results for input(s): CHOL, HDL, LDLCALC, TRIG, CHOLHDL, LDLDIRECT in the last 72 hours. Thyroid Function Tests: No results for input(s): TSH, T4TOTAL, FREET4, T3FREE, THYROIDAB in the last 72 hours. Anemia Panel: No results for input(s): VITAMINB12, FOLATE, FERRITIN, TIBC, IRON, RETICCTPCT in the last 72 hours. Sepsis Labs: Recent Labs  Lab 04/18/21 0250 04/19/21 0240  PROCALCITON <0.10 <0.10    Recent Results (from the past 240 hour(s))  Resp Panel by RT-PCR (Flu A&B, Covid) Nasopharyngeal Swab     Status: None   Collection Time: 04/17/21  7:45 PM   Specimen: Nasopharyngeal Swab; Nasopharyngeal(NP) swabs in vial transport medium  Result Value Ref Range Status   SARS Coronavirus 2 by RT PCR NEGATIVE NEGATIVE Final    Comment: (NOTE) SARS-CoV-2 target nucleic acids are NOT DETECTED.  The SARS-CoV-2 RNA is generally detectable in upper respiratory specimens during the acute phase of infection. The lowest concentration of SARS-CoV-2 viral copies this assay can detect is 138 copies/mL. A  negative result does not preclude  SARS-Cov-2 infection and should not be used as the sole basis for treatment or other patient management decisions. A negative result may occur with  improper specimen collection/handling, submission of specimen other than nasopharyngeal swab, presence of viral mutation(s) within the areas targeted by this assay, and inadequate number of viral copies(<138 copies/mL). A negative result must be combined with clinical observations, patient history, and epidemiological information. The expected result is Negative.  Fact Sheet for Patients:  EntrepreneurPulse.com.au  Fact Sheet for Healthcare Providers:  IncredibleEmployment.be  This test is no t yet approved or cleared by the [place] FDA and  has been authorized for detection and/or diagnosis of SARS-CoV-2 by FDA under an Emergency Use Authorization (EUA). This EUA will remain  in effect (meaning this test can be used) for the duration of the COVID-19 declaration under Section 564(b)(1) of the Act, 21 U.S.C.section 360bbb-3(b)(1), unless the authorization is terminated  or revoked sooner.       Influenza A by PCR NEGATIVE NEGATIVE Final   Influenza B by PCR NEGATIVE NEGATIVE Final    Comment: (NOTE) The Xpert Xpress SARS-CoV-2/FLU/RSV plus assay is intended as an aid in the diagnosis of influenza from Nasopharyngeal swab specimens and should not be used as a sole basis for treatment. Nasal washings and aspirates are unacceptable for Xpert Xpress SARS-CoV-2/FLU/RSV testing.  Fact Sheet for Patients: EntrepreneurPulse.com.au  Fact Sheet for Healthcare Providers: IncredibleEmployment.be  This test is not yet approved or cleared by the [place] FDA and has been authorized for detection and/or diagnosis of SARS-CoV-2 by FDA under an Emergency Use Authorization (EUA). This EUA will remain in effect (meaning this test can be used) for the duration of  the COVID-19 declaration under Section 564(b)(1) of the Act, 21 U.S.C. section 360bbb-3(b)(1), unless the authorization is terminated or revoked.  Performed at [facility], [place] [address], [place], [place] [address]      Radiology Studies: No results found.   LOS: 3 days   Phillips Climes, MD [facility]  04/20/2021, 12:30 PM

## 2021-04-20 NOTE — Progress Notes (Signed)
Patient resting comfortably on 2L [place]. No distress noted. BIPAP on stand-by.

## 2021-04-20 NOTE — Telephone Encounter (Signed)
Pt's wife called states her husband is current;y admitted in the ED. States he does need the CPAP machine however pt is having a hard breathing and the machine might get in the way./ wife is requesting a call back.

## 2021-04-20 NOTE — Progress Notes (Signed)
Progress Note  Patient Name: [name] Date of Encounter: 04/20/2021  Attending physician: Albertine Patricia, MD Primary care provider: [name], MD  Subjective: [name] is a 65 y.o. male who was seen and examined at bedside  No events overnight. No chest pain.  Shortness of breath is much improved since admission.  On 2L [place] at rest.  LE swelling is resolved.  Denies PND, LE swelling, and orthopena.  Case discussed and reviewed with his nurse.  Objective: Vital Signs in the last 24 hours: Temp:  [97.7 F (36.5 C)-98.6 F (37 C)] 97.7 F (36.5 C) (08/15 0445) Pulse Rate:  [62-77] 67 (08/15 0911) Resp:  [14-23] 20 (08/15 0452) BP: (125-146)/(65-88) 146/85 (08/15 0911) SpO2:  [92 %-98 %] 92 % (08/15 0911) FiO2 (%):  [30 %] 30 % (08/15 0452) Weight:  [102.5 kg] 102.5 kg (08/15 0445)  Intake/Output:  Intake/Output Summary (Last 24 hours) at 04/20/2021 1011 Last data filed at 04/20/2021 0447 Gross per 24 hour  Intake --  Output 1360 ml  Net -1360 ml    Net IO Since Admission: -2,020 mL [04/20/21 1011]  Weights:  Filed Weights   04/18/21 0600 04/19/21 0407 04/20/21 0445  Weight: 101.2 kg 101.7 kg 102.5 kg    Telemetry: Personally reviewed -normal sinus with rare NSVT  Physical examination: PHYSICAL EXAM: Vitals with BMI 04/20/2021 04/20/2021 04/20/2021  Height - - -  Weight - - 225 lbs 14 oz  BMI - - 99991111  Systolic 123456 0000000 0000000  Diastolic 85 65 65  Pulse 67 - 77    CONSTITUTIONAL: Well-developed and well-nourished. No acute distress.  SKIN: Skin is warm and dry. No rash noted. No cyanosis. No pallor. No jaundice HEAD: Normocephalic and atraumatic.  EYES: No scleral icterus MOUTH/THROAT: Moist oral membranes.  NECK: No JVD present. No thyromegaly noted. No carotid bruits  LYMPHATIC: No visible cervical adenopathy.  CHEST Normal respiratory effort. No intercostal retractions  LUNGS: Clear to auscultation bilaterally.  No stridor. No wheezes. No  rales.  CARDIOVASCULAR: Regular, positive S1-S2, no murmurs rubs or gallops appreciated. ABDOMINAL: Soft, nontender, nondistended, positive bowel sounds in all 4 quadrants, no apparent ascites.  EXTREMITIES: No peripheral edema, warm to touch bilaterally. HEMATOLOGIC: No significant bruising NEUROLOGIC: Oriented to person, place, and time. Nonfocal. Normal muscle tone.  PSYCHIATRIC: Normal mood and affect. Normal behavior. Cooperative  Lab Results: Hematology Recent Labs  Lab 04/18/21 0250 04/19/21 0240 04/20/21 0217  WBC 7.7 5.7 4.6  RBC 3.84* 3.40* 3.58*  HGB 11.4* 10.4* 10.6*  HCT 35.7* 31.6* 33.0*  MCV 93.0 92.9 92.2  MCH 29.7 30.6 29.6  MCHC 31.9 32.9 32.1  RDW 16.1* 16.0* 15.5  PLT 231 228 218    Chemistry Recent Labs  Lab 04/17/21 1930 04/18/21 0250 04/19/21 0240 04/20/21 0217  NA 137 136 139 140  K 3.4* 5.8* 3.6 3.9  CL 99 101 100 102  CO2 31 29 33* 30  GLUCOSE 120* 110* 104* 174*  BUN '12 16 20 23  '$ CREATININE 1.78* 1.80* 2.03* 1.93*  CALCIUM 9.0 8.4* 8.3* 8.5*  PROT 6.6  --  5.6*  --   ALBUMIN 3.2*  --  2.6*  --   AST 27  --  19  --   ALT 25  --  20  --   ALKPHOS 83  --  62  --   BILITOT 0.5  --  0.7  --   GFRNONAA 42* 42* 36* 38*  ANIONGAP 7 6  6 8     Cardiac Enzymes: Cardiac Panel (last 3 results) Recent Labs    04/17/21 1930 04/17/21 2153 04/18/21 0250  TROPONINIHS '12 12 14    '$ BNP (last 3 results) Recent Labs    04/07/21 1251 04/09/21 0147 04/17/21 1930  BNP 3,953.6* 1,645.7* 2,729.0*    ProBNP (last 3 results) Recent Labs    03/30/21 0707 04/06/21 0725 04/14/21 0712  PROBNP 2,643* 5,653* 3,473*     DDimer  Recent Labs  Lab 04/17/21 1942  DDIMER 0.82*     Hemoglobin A1c:  Lab Results  Component Value Date   HGBA1C 5.7 (H) 04/08/2021   MPG 116.89 04/08/2021    TSH  Recent Labs    11/19/20 0233  TSH 1.247    Lipid Panel     Component Value Date/Time   CHOL 111 04/09/2021 0147   TRIG 53 04/09/2021 0147    HDL 62 04/09/2021 0147   CHOLHDL 1.8 04/09/2021 0147   VLDL 11 04/09/2021 0147   LDLCALC 38 04/09/2021 0147    Imaging: No results found.  Cardiac database: EKG 04/17/2021: Sinus rhythm 75 bpm Prolonged QT interval   Echocardiogram 03/23/2021:  1. Left ventricular ejection fraction, by estimation, is 60 to 65%. The  left ventricle has normal function. The left ventricle has no regional  wall motion abnormalities. There is mild left ventricular hypertrophy.  Left ventricular diastolic parameters  are consistent with Grade I diastolic dysfunction (impaired relaxation).   2. Right ventricular systolic function is normal. The right ventricular  size is normal. There is moderately elevated pulmonary artery systolic  pressure. The estimated right ventricular systolic pressure is 0000000 mmHg.   3. Left atrial size was severely dilated.   4. The mitral valve is normal in structure. Mild mitral valve  regurgitation. No evidence of mitral stenosis.   5. The aortic valve is calcified. Aortic valve regurgitation is mild. No  aortic stenosis is present.   6. The inferior vena cava is dilated in size with <50% respiratory  variability, suggesting right atrial pressure of 15 mmHg.  Scheduled Meds:  aspirin  81 mg Oral Daily   atorvastatin  20 mg Oral Daily   bumetanide (BUMEX) IV  1 mg Intravenous Q12H   dapagliflozin propanediol  10 mg Oral Daily   diltiazem  360 mg Oral Daily   enoxaparin (LOVENOX) injection  40 mg Subcutaneous Q24H   guaiFENesin  600 mg Oral BID   ipratropium-albuterol  3 mL Nebulization TID   isosorbide-hydrALAZINE  1 tablet Oral TID   labetalol  300 mg Oral TID   methylPREDNISolone (SOLU-MEDROL) injection  80 mg Intravenous Q24H   nicotine  7 mg Transdermal Daily   pantoprazole  40 mg Oral Daily   polyethylene glycol  17 g Oral Daily   senna-docusate  1 tablet Oral BID   sodium chloride flush  3 mL Intravenous Q12H   tamsulosin  0.4 mg Oral Daily    Continuous  Infusions:  sodium chloride      PRN Meds: sodium chloride, acetaminophen **OR** acetaminophen, senna-docusate, sodium chloride flush, traZODone   IMPRESSION & RECOMMENDATIONS: [name] is a 65 y.o. male whose past medical history and cardiac risk factors include: Sarcoidosis, HTN, Tobacco abuse, Hx of PE (was on Xarelto for 1 year), OSA not on CPAP, MSSA Bacteremia (March 2022), Hx of cardiac arrest (2002 during his hospitalization for pancreatitis per wife).  Acute on chronic heart failure with preserved EF, stage C, NYHA class II: Net  negative urine output of 2 L Educated on the importance of low-salt diet, compliance with medication Continue labetalol, BiDil, and [place]. Entresto and Bumex currently held secondary to AKI Had a long discussion with the patient with regards to recurrent hospitalization for congestive heart failure over the last 1.5-2 months which is most likely due to dietary indiscretion, questionable medication compliance. Most recent echocardiogram results reviewed and noted above. Currently on 2 L nasal cannula oxygen  Acute hypoxic respiratory failure: Multifactorial: Underlying COPD, history of smoking, history of OSA not on CPAP, recurrent hospitalizations in the recent past predisposing him to HCAP, and acute HFpEF exacerbation. Heart failure management as discussed above. Patient has improved clinically with administration of steroids and bronchodilators suggesting that this time it is hospitalizations are most likely due to pulmonary as opposed to cardiac conditions.   Recommend pulmonary evaluation   Acute kidney injury: Most likely secondary to intravascular depletion due to diuresis Hold Bumex and Entresto for now Would like resolution of AKI and restarting of diuretics prior to discharge  Benign essential hypertension: Blood pressure within acceptable range.  Patient's questions and concerns were addressed to his satisfaction. He voices  understanding of the instructions provided during this encounter.   This note was created using a voice recognition software as a result there may be grammatical errors inadvertently enclosed that do not reflect the nature of this encounter. Every attempt is made to correct such errors.  Rex Kras, DO, [facility]. [place] Office: [phone] 04/20/2021, 10:11 AM

## 2021-04-21 ENCOUNTER — Encounter (HOSPITAL_COMMUNITY): Payer: Self-pay | Admitting: Family Medicine

## 2021-04-21 ENCOUNTER — Inpatient Hospital Stay (HOSPITAL_COMMUNITY): Payer: PPO

## 2021-04-21 ENCOUNTER — Other Ambulatory Visit (HOSPITAL_COMMUNITY): Payer: Self-pay

## 2021-04-21 DIAGNOSIS — D869 Sarcoidosis, unspecified: Secondary | ICD-10-CM

## 2021-04-21 DIAGNOSIS — N179 Acute kidney failure, unspecified: Secondary | ICD-10-CM

## 2021-04-21 DIAGNOSIS — I5033 Acute on chronic diastolic (congestive) heart failure: Secondary | ICD-10-CM

## 2021-04-21 LAB — BASIC METABOLIC PANEL
Anion gap: 10 (ref 5–15)
BUN: 23 mg/dL (ref 8–23)
CO2: 29 mmol/L (ref 22–32)
Calcium: 8.7 mg/dL — ABNORMAL LOW (ref 8.9–10.3)
Chloride: 101 mmol/L (ref 98–111)
Creatinine, Ser: 2 mg/dL — ABNORMAL HIGH (ref 0.61–1.24)
GFR, Estimated: 37 mL/min — ABNORMAL LOW (ref >60)
Glucose, Bld: 212 mg/dL — ABNORMAL HIGH (ref 70–99)
Potassium: 3.7 mmol/L (ref 3.5–5.1)
Sodium: 140 mmol/L (ref 135–145)

## 2021-04-21 MED ORDER — PREDNISONE 20 MG PO TABS
40.0000 mg | ORAL_TABLET | Freq: Every day | ORAL | Status: DC
  Administered 2021-04-22 – 2021-04-23 (×2): 40 mg via ORAL
  Filled 2021-04-21 (×2): qty 2

## 2021-04-21 MED ORDER — MOMETASONE FURO-FORMOTEROL FUM 100-5 MCG/ACT IN AERO
2.0000 | INHALATION_SPRAY | Freq: Two times a day (BID) | RESPIRATORY_TRACT | Status: DC
  Administered 2021-04-22 – 2021-04-23 (×3): 2 via RESPIRATORY_TRACT
  Filled 2021-04-21: qty 8.8

## 2021-04-21 NOTE — Care Management Important Message (Signed)
Important Message  Patient Details  Name: [name] MRN: [id number] Date of Birth: 1956/01/29   Medicare Important Message Given:  Yes     [name] 04/21/2021, 11:40 AM

## 2021-04-21 NOTE — Progress Notes (Addendum)
Heart Failure Stewardship Pharmacist Progress Note   PCP: [name], MD PCP-Cardiologist: None    HPI:  65 yo M with PMH of CHF, sarcoidosis, HTN, HLD, CVA, and CKD IIIb. He presented to the ED on 8/12 with shortness of breath, cough, and LE edema.  Recent hospitalization from 8/2-8/4 for acute on chronic diastolic heart failure. His last ECHO was done on 03/23/21 and LVEF was 60-65% (relatively stable from prior [facility]). Concern for dietary indiscretion vs medication noncompliance. CXR on admission significant for CHF.   Current HF Medications: Farxiga 10 mg daily BiDil 20/37.5 mg 1 tab TID  Prior to admission HF Medications: Bumetanide 1 mg BID Entresto 49/51 mg BID BiDil 20/37.5 mg 1 tab TID Farxiga 10 mg daily  Pertinent Lab Values: Serum creatinine 2.00 (baseline ~1.6-1.8), BUN 23, Potassium 3.7, Sodium 140, BNP 2729.0  Vital Signs: Weight: 226 lbs (admission weight: 224 lbs) Blood pressure: 140/80s  Heart rate: 60s   Medication Assistance / Insurance Benefits Check: Does the patient have prescription insurance?  Yes Type of insurance plan: [employer]  Outpatient Pharmacy:  Prior to admission [facility] Is the patient willing to use [facility] at discharge? Yes Is the patient willing to transition their outpatient pharmacy to utilize a [facility] outpatient pharmacy?   Pending    Assessment: 1. Acute on chronic diastolic CHF (EF 123456). NYHA class II symptoms. - Off IV bumex with improvement in LE edema and shortness of breath - Entresto on hold with AKI. Would resume at discharge - Continue Farxiga 10 mg daily - Continue BiDil 20/37.5 mg 1 tab TID   Plan: 1) Medication changes recommended at this time: - Continue current regimen - Resume Entresto and bumex at discharge  2) Patient assistance: - HF TOC appt scheduled for 8/24 - Patient is in coverage gap (donut hole) with insurance. Will enroll in PAN grant to bring  copay for Entresto down to $0 per month - BiDil is now available as generic   3)  Education  - Patient has been educated on current HF medications and potential additions to HF medication regimen - Patient verbalizes understanding that over the next few months, these medication doses may change and more medications may be added to optimize HF regimen - Patient has been educated on basic disease state pathophysiology and goals of therapy  Kerby Nora, PharmD, BCPS Heart Failure Stewardship Pharmacist Phone [phone]

## 2021-04-21 NOTE — Progress Notes (Signed)
Heart Failure Nurse Navigator Progress Note  PCP: [name], MD Primary Cardiologist: Brennan Bailey., DO Admission Diagnosis: Acute on chronic CHF Admitted from: home with spouse  Presentation:   [name] presented on 8/12 with SOB, cough and BLE edema. Recent hospitalization for A/CHF. Pt interactive with interview process, but did not know many answers and appeared slightly aloof to some financial, medication and self-care questions. Pt states he sets up his own medications and takes as prescribed. Pt states he follows a low sodium diet and "doesn't drink that much" regarding fluid restrictions. Pt lives with spouse and adult daughter with her 37 sons, all members independent. Per pt, spouse works within [employer] and better understands the system. Asked permission to call spouse to give update about transition plan, pt declined as she is at work right now and cannot talk. Pt given HV TOC appointment card and explained clinic goals.   ECHO/ LVEF: 03/23/21 60-65%.   Clinical Course:  Past Medical History:  Diagnosis Date   Back pain    CKD (chronic kidney disease), stage III (HCC)    CVA (cerebral infarction)    Diastolic dysfunction    ED (erectile dysfunction)    GERD (gastroesophageal reflux disease)    Hypercalcemia    Hyperlipidemia    Hypertension    Insomnia    Long-term use of high-risk medication    Nephrocalcinosis    Nephrolithiasis    Obesity    Osteopenia    Pancreatitis    PE (pulmonary embolism)    Proteinuria    Sarcoidosis    Smoker      Social History   Socioeconomic History   Marital status: Married    Spouse name: Building services engineer   Number of children: 2   Years of education: Not on file   Highest education level: Associate degree: academic program  Occupational History   Not on file  Tobacco Use   Smoking status: Former    Packs/day: 0.25    Years: 15.00    Pack years: 3.75    Types: Cigarettes    Start date: 09/06/2005    Quit date: 02/23/2021     Years since quitting: 0.1   Smokeless tobacco: Never  Vaping Use   Vaping Use: Never used  Substance and Sexual Activity   Alcohol use: No    Alcohol/week: 0.0 standard drinks   Drug use: No   Sexual activity: Yes  Other Topics Concern   Not on file  Social History Narrative   Not on file   Social Determinants of Health   Financial Resource Strain: Low Risk    Difficulty of Paying Living Expenses: Not very hard  Food Insecurity: Not on file  Transportation Needs: No Transportation Needs   Lack of Transportation (Medical): No   Lack of Transportation (Non-Medical): No  Physical Activity: Not on file  Stress: Not on file  Social Connections: Not on file    High Risk Criteria for Readmission and/or Poor Patient Outcomes: Heart failure hospital admissions (last 6 months): 3 No Show rate: 8% Difficult social situation: no Demonstrates medication adherence: questionable Primary Language: English Literacy level: able to read and write. Concern for comprehension.  Education Assessment and Provision:  Detailed education and instructions provided on heart failure disease management including the following:  Signs and symptoms of Heart Failure When to call the physician Importance of daily weights Low sodium diet Fluid restriction Medication management Anticipated future follow-up appointments  Patient education given on each of  the above topics.  Patient acknowledges understanding via teach back method and acceptance of all instructions.  Education Materials:  "Living Better With Heart Failure" Booklet, HF zone tool, & Daily Weight Tracker Tool.  Patient has scale at home: yes Patient has pill box at home: yes   Barriers of Care:   -understanding/comprehension -dietary/fluid modification compliance -medication compliance?  Considerations/Referrals:   Referral made to Heart Failure Pharmacist Stewardship: yes Referral made to Heart & Vascular TOC clinic:  yes  Items for Follow-up on DC/TOC: -medication compliance -medication cost -dietary modifications  Pricilla Holm, RN, BSN Heart Failure Nurse Navigator [phone]

## 2021-04-21 NOTE — Consult Note (Signed)
NAME:  [name], MRN:  [id number], DOB:  19-Oct-1955, LOS: 4 ADMISSION DATE:  04/17/2021, CONSULTATION DATE:  04/21/21 REFERRING MD:  Elgergawy, CHIEF COMPLAINT:  Shortness of breath   History of Present Illness:   65 y.o. M with PMH significant for Sarcoidosis, CKD stage III,  HTN, HL, PE, tobacco abuse  and HFpEF with recent admission for HF exacerbation who presented to the ED on 8/12 with shortness of breath.  Hospital course Admitted. Therapeutic interventions have included: supplemental oxygen, IV lasix 3.7 liters negative since admit  to 8/16). Required BIPAP initially. Was started on pulse dose steroids 8/13 (typically on pred '5mg'$ /d) for wheezing.  On 8/15 still had some shortness of breath requiring BIPAP intermittently. On 8/16 was on oxygen so CT chest ordered. This showed widespread patchy nodular thickening, peri-bronchovascular -GG changes. Concern for progressive sarcoid. PCCM asked to see for recommendations of steroid taper and also to re-establish care    Pertinent  Medical History   has a past medical history of Back pain, CKD (chronic kidney disease), stage III ([place]), CVA (cerebral infarction), Diastolic dysfunction, ED (erectile dysfunction), GERD (gastroesophageal reflux disease), Hypercalcemia, Hyperlipidemia, Hypertension, Insomnia, Long-term use of high-risk medication, Nephrocalcinosis, Nephrolithiasis, Obesity, Osteopenia, Pancreatitis, PE (pulmonary embolism), Proteinuria, Sarcoidosis, and Smoker.   Significant Hospital Events: Including procedures, antibiotic start and stop dates in addition to other pertinent events   8/12 admitted w/working dx HF 8/13 to 8/16: Was started on pulse dose steroids 8/13 (typically on pred '5mg'$ /d) for wheezing.  On 8/15 still had some shortness of breath requiring BIPAP intermittently. On 8/16 was on oxygen so CT chest ordered. This showed widespread patchy nodular thickening, peri-bronchovascular -GG changes. Concern for progressive  sarcoid. PCCM asked to see for recommendations of steroid taper and also to re-establish care   Interim History / Subjective:   Objective   Blood pressure (!) 145/84, pulse 61, temperature 97.7 F (36.5 C), temperature source Oral, resp. rate 18, height 6' (1.829 m), weight 102.8 kg, SpO2 96 %.        Intake/Output Summary (Last 24 hours) at 04/21/2021 1634 Last data filed at 04/21/2021 0426 Gross per 24 hour  Intake 240 ml  Output 1875 ml  Net -1635 ml   Filed Weights   04/19/21 0407 04/20/21 0445 04/21/21 0225  Weight: 101.7 kg 102.5 kg 102.8 kg    Examination: General: 65 year old male resting in bed no distress HENT: NCAT no JVD MMM  Lungs: decreased t/o. No accessory use  Cardiovascular: RRR no JVD  Abdomen: soft not tender Extremities: LE edema TED hose in place  Neuro: awake. Alert. Affect flat.  GU: voids  Resolved Hospital Problem list     Assessment & Plan:   Acute hypoxic respiratory failure  Acute on chronic heart failure  H/o OSA Pulmonary edema  HTN CKD stage 3b Fluid and electrolyte imbalance    Acute hypoxic respiratory failure 2/2 acute on chronic heart failure w/ pulmonary edema +/- flare of his underlying Sarcoid.  Seems like also untreated sleep apnea may be a contributing factor to the decompensated diastolic HF component  -not clear which he got more benefit from.. steroids vs diuresis-->perhaps both?  Plan/rec Cont supplemental oxygen --> may need to go home on this. Would recommend walking oximetry prior to dc Cont systemic steroids. Will transition him to prednisone '40mg'$ /d starting 8/17 would taper slowly ->'10mg'$  every 5 days. Should see [place] in office before he gets to the '5mg'$ /d dose  We can address  OSA in out-pt  Cont to treat HF Will add ICS/LABA  Will set him up to see [place] in office.    Best Practice (right click and "Reselect all SmartList Selections" daily)  Per primary   Labs   CBC: Recent Labs  Lab 04/17/21 1930 04/18/21 0250  04/19/21 0240 04/20/21 0217  WBC 6.8 7.7 5.7 4.6  NEUTROABS 5.3  --   --   --   HGB 12.4* 11.4* 10.4* 10.6*  HCT 38.6* 35.7* 31.6* 33.0*  MCV 92.8 93.0 92.9 92.2  PLT 246 231 228 [id number]    Basic Metabolic Panel: Recent Labs  Lab 04/17/21 1930 04/18/21 0250 04/19/21 0240 04/20/21 0217 04/21/21 0258  NA 137 136 139 140 140  K 3.4* 5.8* 3.6 3.9 3.7  CL 99 101 100 102 101  CO2 31 29 33* 30 29  GLUCOSE 120* 110* 104* 174* 212*  BUN '12 16 20 23 23  '$ CREATININE 1.78* 1.80* 2.03* 1.93* 2.00*  CALCIUM 9.0 8.4* 8.3* 8.5* 8.7*   GFR: Estimated Creatinine Clearance: 46.3 mL/min (A) (by C-G formula based on SCr of 2 mg/dL (H)). Recent Labs  Lab 04/17/21 1930 04/18/21 0250 04/19/21 0240 04/20/21 0217  PROCALCITON  --  <0.10 <0.10  --   WBC 6.8 7.7 5.7 4.6    Liver Function Tests: Recent Labs  Lab 04/17/21 1930 04/19/21 0240  AST 27 19  ALT 25 20  ALKPHOS 83 62  BILITOT 0.5 0.7  PROT 6.6 5.6*  ALBUMIN 3.2* 2.6*   No results for input(s): LIPASE, AMYLASE in the last 168 hours. No results for input(s): AMMONIA in the last 168 hours.  ABG    Component Value Date/Time   HCO3 32.8 (H) 11/18/2020 1921   TCO2 31 11/18/2020 1922   O2SAT 59.0 11/18/2020 1921     Coagulation Profile: No results for input(s): INR, PROTIME in the last 168 hours.  Cardiac Enzymes: No results for input(s): CKTOTAL, CKMB, CKMBINDEX, TROPONINI in the last 168 hours.  HbA1C: Hgb A1c MFr Bld  Date/Time Value Ref Range Status  04/08/2021 02:32 AM 5.7 (H) 4.8 - 5.6 % Final    Comment:    (NOTE) Pre diabetes:          5.7%-6.4%  Diabetes:              >6.4%  Glycemic control for   <7.0% adults with diabetes   03/15/2019 04:25 AM 5.8 (H) 4.8 - 5.6 % Final    Comment:    (NOTE)         Prediabetes: 5.7 - 6.4         Diabetes: >6.4         Glycemic control for adults with diabetes: <7.0     CBG: No results for input(s): GLUCAP in the last 168 hours.  Review of Systems:   Review of  Systems  Constitutional: Negative.   HENT: Negative.    Eyes: Negative.   Respiratory:  Positive for shortness of breath and wheezing.   Cardiovascular:  Positive for orthopnea and leg swelling. Negative for chest pain and palpitations.  Gastrointestinal: Negative.   Genitourinary: Negative.   Musculoskeletal: Negative.   Skin: Negative.   Neurological: Negative.   Endo/Heme/Allergies: Negative.   Psychiatric/Behavioral: Negative.      Past Medical History:  He,  has a past medical history of Back pain, CKD (chronic kidney disease), stage III ([place]), CVA (cerebral infarction), Diastolic dysfunction, ED (erectile dysfunction), GERD (gastroesophageal reflux disease), Hypercalcemia, Hyperlipidemia, Hypertension,  Insomnia, Long-term use of high-risk medication, Nephrocalcinosis, Nephrolithiasis, Obesity, Osteopenia, Pancreatitis, PE (pulmonary embolism), Proteinuria, Sarcoidosis, and Smoker.   Surgical History:   Past Surgical History:  Procedure Laterality Date   ABDOMINAL EXPLORATION SURGERY     BACK SURGERY     CHOLECYSTECTOMY     SHOULDER ARTHROSCOPY Right 10/30/2020   Procedure: ARTHROSCOPY SHOULDER WITH EXTENSIVE DEBRIDEMENT;  Surgeon: Mcarthur Rossetti, MD;  Location: [facility];  Service: Orthopedics;  Laterality: Right;   SHOULDER SURGERY     TRACHEOSTOMY     closed     Social History:   reports that he quit smoking about 8 weeks ago. His smoking use included cigarettes. He started smoking about 15 years ago. He has a 3.75 pack-year smoking history. He has never used smokeless tobacco. He reports that he does not drink alcohol and does not use drugs.   Family History:  His family history includes Alzheimer's disease in his mother; CVA in his father; Heart failure in his sister; Hypertension in his father and sister; Lung cancer in his father.   Allergies Allergies  Allergen Reactions   Codeine Nausea Only   Hydromorphone Other (See Comments)     hallucinations      Home Medications  Prior to Admission medications   Medication Sig Start Date End Date Taking? Authorizing Provider  aspirin 81 MG tablet Take 1 tablet (81 mg total) by mouth daily. 02/01/13  Yes [name], MD  atorvastatin (LIPITOR) 20 MG tablet Take 20 mg by mouth daily. 02/02/19  Yes [provider]  BLACK ELDERBERRY PO Take 2 each by mouth daily as needed (immune support). Gummies   Yes [provider]  bumetanide (BUMEX) 1 MG tablet Take 1 tablet (1 mg total) by mouth 2 (two) times daily. 04/17/21 07/16/21 Yes Tolia, Sunit, DO  dapagliflozin propanediol (FARXIGA) 10 MG TABS tablet Take 1 tablet (10 mg total) by mouth daily. 04/10/21 04/10/22 Yes Jennye Boroughs, MD  diltiazem (CARDIZEM CD) 360 MG 24 hr capsule Take 360 mg by mouth daily.   Yes [provider]  fluticasone (FLONASE) 50 MCG/ACT nasal spray Place 2 sprays into both nostrils daily as needed for allergies or rhinitis.   Yes [provider]  isosorbide-hydrALAZINE (BIDIL) 20-37.5 MG tablet Take 1 tablet by mouth 3 (three) times daily. 03/30/21  Yes Tolia, Sunit, DO  labetalol (NORMODYNE) 300 MG tablet Take 300 mg by mouth 3 (three) times daily.   Yes [provider]  sacubitril-valsartan (ENTRESTO) 49-51 MG Take 1 tablet by mouth 2 (two) times daily. 04/17/21 07/16/21 Yes Tolia, Sunit, DO  tamsulosin (FLOMAX) 0.4 MG CAPS capsule Take 0.4 mg by mouth daily. 11/01/18  Yes [provider]  traZODone (DESYREL) 150 MG tablet Take 1 tablet (150 mg total) by mouth at bedtime as needed for sleep. 12/22/20  Yes Dohmeier, Asencion Partridge, MD     Critical care time: NA    Erick Colace ACNP-BC [place] Pager # [phone] OR # [phone] if no answer

## 2021-04-21 NOTE — Progress Notes (Signed)
Heart Failure Stewardship Pharmacist Progress Note  The patient was approved for an Dance movement psychotherapist through the [employer].  BIN: [id number] Hook ID: [id number] Group: [id number]   This will be billed as a secondary insurance after billing Medicare to lower copays to $0 per month.  Approved through 04/20/22  Amount: $1,000 Phone Number: [phone]  Kerby Nora, PharmD, BCPS Heart Failure Stewardship Pharmacist Phone [phone]  Please check [email or web address] for unit-specific pharmacist phone numbers

## 2021-04-21 NOTE — Evaluation (Signed)
Physical Therapy Evaluation Patient Details Name: [name] MRN: [id number] DOB: 1956/03/29 Today's Date: 04/21/2021   History of Present Illness  65 y.o. male presents to [facility] ED on 04/17/2021 with reports of SOB. Pt recently admitted for CHF exacerbation and discharged 04/09/2021. Pt admitted for management of acute on chronic heart failure and acute hypoxemic respiratory failure. PMH includes diastolic CHF, sarcoidosis, hypertension, hyperlipidemia, history of CVA, CKD stage IIIb.  Clinical Impression  Pt presents to PT with deficits in cardiopulmonary endurance. Pt ambulates independently at this time, however does desaturate when mobilizing on room air at this time. Pt is encouraged to ambulate out of the room multiple times each day to improve activity tolerance and cardiopulmonary capacity. PT recommends no PT or DME at the time of discharge.    Follow Up Recommendations No PT follow up    Equipment Recommendations  None recommended by PT    Recommendations for Other Services       Precautions / Restrictions Precautions Precautions: Other (comment) Precaution Comments: monitor SpO2 Restrictions Weight Bearing Restrictions: No      Mobility  Bed Mobility Overal bed mobility: Independent                  Transfers Overall transfer level: Independent                  Ambulation/Gait Ambulation/Gait assistance: Independent Gait Distance (Feet): 300 Feet Assistive device: None Gait Pattern/deviations: WFL(Within Functional Limits) Gait velocity: functional Gait velocity interpretation: >2.62 ft/sec, indicative of community ambulatory General Gait Details: steady step-through gait  Stairs            Wheelchair Mobility    Modified Rankin (Stroke Patients Only)       Balance Overall balance assessment: No apparent balance deficits (not formally assessed)                                           Pertinent Vitals/Pain  Pain Assessment: No/denies pain    Home Living Family/patient expects to be discharged to:: Private residence Living Arrangements: Spouse/significant other;Children;Other relatives Available Help at Discharge: Family;Available PRN/intermittently Type of Home: House Home Access: Level entry     Home Layout: Two level;Bed/bath upstairs Home Equipment: None Additional Comments: Reports that spouse works    Prior Function Level of Independence: Independent         Comments: I w/ ADLs/IADLs; drives; mows own lawn; 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; enjoys playing the piano; keeps his grandsons during the day, ages 2-9yo     Engineer, manufacturing Dominance   Dominant Hand: Right    Extremity/Trunk Assessment   Upper Extremity Assessment Upper Extremity Assessment: Overall WFL for tasks assessed    Lower Extremity Assessment Lower Extremity Assessment: Overall WFL for tasks assessed    Cervical / Trunk Assessment Cervical / Trunk Assessment: Normal  Communication   Communication: No difficulties  Cognition Arousal/Alertness: Awake/alert Behavior During Therapy: WFL for tasks assessed/performed Overall Cognitive Status: Within Functional Limits for tasks assessed                                        General Comments General comments (skin integrity, edema, etc.): pt on RA upon arrival, sats in mid-90s at rest. Pt desats to 88% when ambulating on room  air, denies SOB. Sats recover to 95% within 30 seconds of seated rest break    Exercises     Assessment/Plan    PT Assessment Patient needs continued PT services  PT Problem List Decreased activity tolerance;Cardiopulmonary status limiting activity       PT Treatment Interventions Therapeutic exercise;Therapeutic activities;Patient/family education    PT Goals (Current goals can be found in the Care Plan section)  Acute Rehab PT Goals Patient Stated Goal: to go home PT Goal Formulation: With patient Time  For Goal Achievement: 05/05/21 Potential to Achieve Goals: Good Additional Goals Additional Goal #1: Pt will ambulate for >400' with report of 2/10 DOE or less to indicate improved tolerance for community mobility    Frequency Min 3X/week   Barriers to discharge        Co-evaluation               AM-PAC PT "6 Clicks" Mobility  Outcome Measure Help needed turning from your back to your side while in a flat bed without using bedrails?: None Help needed moving from lying on your back to sitting on the side of a flat bed without using bedrails?: None Help needed moving to and from a bed to a chair (including a wheelchair)?: None Help needed standing up from a chair using your arms (e.g., wheelchair or bedside chair)?: None Help needed to walk in hospital room?: None Help needed climbing 3-5 steps with a railing? : A Little 6 Click Score: 23    End of Session   Activity Tolerance: Patient tolerated treatment well Patient left: in bed;with call bell/phone within reach Nurse Communication: Mobility status PT Visit Diagnosis: Other abnormalities of gait and mobility (R26.89)    Time: [id number] PT Time Calculation (min) (ACUTE ONLY): 12 min   Charges:   PT Evaluation $PT Eval Low Complexity: [place], PT, DPT Acute Rehabilitation Pager: [place] 04/21/2021, 1:49 PM

## 2021-04-21 NOTE — Progress Notes (Signed)
PROGRESS NOTE    [name]  [id number] DOB: [birth year] DOA: 04/17/2021 PCP: [name], MD   Chief Complaint  Patient presents with   Shortness of Breath    Brief Narrative: 65 year old male with history of diastolic CHF, sarcoidosis, hypertension, hyperlipidemia, history of CVA, CKD stage IIIb, presented to the ED with shortness of breath on the day of admission was hypoxic on arrival at 70%.  He has been having productive cough, shortness of breath nasal congestion x2 days, was seen in the cardiology office with he was short of breath appears to have upper respiratory infection with symptoms, also hypoxic and sent to the ED. In the ED, work-up showed creatinine 1.78, BNP 2729 troponin negative x2, labs no leukocytosis w/ chronic anemia, d dimer 0.8, COVID-19 influenza screen negative.CXR showed  "Cardiomegaly with vascular congestion, pulmonary edema and possible small right effusion".  Last admission 8/2-8/4 for acute on chronic CHF exacerbation, acute hypoxic respiratory failure managed with IV Bumex in the [facility] cardiology discharged home  In the ED patient was placed on BiPAP, IV Bumex was given and admitted.  Subjective:  No requiring oxygen yesterday, but he was on 2 L this morning, overall reports his dyspnea has improved, did not require BiPAP overnight.    Assessment & Plan:  Acute on chronic heart failure with preserved ejection fraction Patient needing BiPAP initially this has improved, no BiPAP requirement over the last 24 hours -Cardiology input greatly appreciated, diuresis per cardiology, on IV Bumex, on hold today given elevated renal function.   -He is -3.7 L since admission -holding Entresto given elevated potassium  - continue BiDil Farxiga, and beta-blocker.   -Entresto remains on hold given AKI and hyperkalemia on presentation.   Net IO Since Admission: -3,780 mL [04/21/21 1604]   Intake/Output Summary (Last 24 hours) at 04/21/2021 1604 Last  data filed at 04/21/2021 0426 Gross per 24 hour  Intake 240 ml  Output 1875 ml  Net -1635 ml    Filed Weights   04/19/21 0407 04/20/21 0445 04/21/21 0225  Weight: 101.7 kg 102.5 kg 102.8 kg      03/30/21 0707 04/06/21 0725 04/14/21 0712  PROBNP 2,643* [id number]* 3,473*   Acute hypoxemic respiratory failure:  -Required BiPAP initially, currently improved after IV diuresis and steroids -Factorial, in the setting of volume overload due to CHF, still exacerbation, and pulmonary sarcoidosis.  COPD exacerbation -Improving with IV steroids and scheduled duo nebs.     Low-grade fever ? URTI viral no leukocytosis, check procalcitonin and if high will add antibiotics. It seems he took dose of zeepack form pcp recently.  Sarcoidosis history on chronic prednisone 5 mg-resume once off IV steroids.  CT chest significant for widespread patchy nodular thickening, mass 3.7 focus of consolidation anterior right lower lobe, being suspicious for progressive pulmonary sarcoidosis, will request pulmonary input.  Ectatic 4.4 cm ascending thoracic aorta. - radiology  Recommend annual imaging followup by CTA or MRA.  Essential hypertension: Blood pressure stable cont his  labetalol bidil   as above  Mild Hyperkalemia: Avoid potassium supplementation.  Hold entresto today. Repeat labs in am.  CKD Stage 3b: Renal function remains close to baseline as below.  Monitor while on diuretics Recent Labs    04/01/21 2303 04/06/21 0726 04/07/21 1251 04/08/21 0232 04/14/21 0712 04/17/21 1930 04/18/21 0250 04/19/21 0240 04/20/21 0217 04/21/21 0258  BUN '15 16 15 17 14 12 16 20 23 23  '$ CREATININE 1.73* 1.53* 1.63* 1.63* 1.79* 1.78* 1.80* 2.03* 1.93* 2.00*  Tobacco abuse: Advised cessation-he is actively quitting smoking.  Prolonged QT interval: Monitor , avoid QT prolonging medication.    Morbid Obesity w/ BMI 30: Patient will benefit  Monitor closely.  At risk of decompensation, low threshold for ICU  evaluation  Diet Order             Diet Heart Room service appropriate? Yes; Fluid consistency: Thin  Diet effective now                   Patient's Body mass index is 30.73 kg/m.  DVT prophylaxis: Place TED hose Start: 04/21/21 1008 enoxaparin (LOVENOX) injection 40 mg Start: 04/18/21 1215lovenox Code Status:   Code Status: Full Code  Family Communication: plan of care discussed with patient at bedside. Status is: Inpatient Remains inpatient appropriate because:IV treatments appropriate due to intensity of illness or inability to take PO and Inpatient level of care appropriate due to severity of illness  Dispo: The patient is from: Home              Anticipated d/c is to: Home              Patient currently is not medically stable to d/c.   Difficult to place patient No    Unresulted Labs (From admission, onward)    None      Medications reviewed:  Scheduled Meds:  aspirin  81 mg Oral Daily   atorvastatin  20 mg Oral Daily   dapagliflozin propanediol  10 mg Oral Daily   diltiazem  360 mg Oral Daily   enoxaparin (LOVENOX) injection  40 mg Subcutaneous Q24H   guaiFENesin  600 mg Oral BID   isosorbide-hydrALAZINE  1 tablet Oral TID   labetalol  300 mg Oral TID   methylPREDNISolone (SOLU-MEDROL) injection  80 mg Intravenous Q24H   nicotine  7 mg Transdermal Daily   pantoprazole  40 mg Oral Daily   polyethylene glycol  17 g Oral Daily   senna-docusate  1 tablet Oral BID   sodium chloride flush  3 mL Intravenous Q12H   tamsulosin  0.4 mg Oral Daily   Continuous Infusions:  sodium chloride     Consultants:see note  Procedures:see note Antimicrobials: Anti-infectives (From admission, onward)    None      Culture/Microbiology    Component Value Date/Time   SDES BLOOD RIGHT HAND 11/19/2020 1828   SPECREQUEST  11/19/2020 1828    BOTTLES DRAWN AEROBIC AND ANAEROBIC Blood Culture results may not be optimal due to an inadequate volume of blood received in  culture bottles   CULT  11/19/2020 1828    NO GROWTH 5 DAYS Performed at [facility], [place] [address] [place], [place] [address]    REPTSTATUS 11/24/2020 FINAL 11/19/2020 1828    Other culture-see note  Objective: Vitals: Today's Vitals   04/21/21 0422 04/21/21 0820 04/21/21 0852 04/21/21 1324  BP: (!) 141/88   (!) 145/84  Pulse: 63  67 61  Resp: '18  16 18  '$ Temp: 97.8 F (36.6 C)   97.7 F (36.5 C)  TempSrc: Oral   Oral  SpO2: 95%  92% 96%  Weight:      Height:      PainSc:  0-No pain      Intake/Output Summary (Last 24 hours) at 04/21/2021 1604 Last data filed at 04/21/2021 0426 Gross per 24 hour  Intake 240 ml  Output 1875 ml  Net -1635 ml   Filed Weights   04/19/21  0407 04/20/21 0445 04/21/21 0225  Weight: 101.7 kg 102.5 kg 102.8 kg   Weight change: 0.318 kg  Intake/Output from previous day: 08/15 0701 - 08/16 0700 In: 240 [P.O.:240] Out: 2000 [Urine:2000] Intake/Output this shift: No intake/output data recorded. Filed Weights   04/19/21 0407 04/20/21 0445 04/21/21 0225  Weight: 101.7 kg 102.5 kg 102.8 kg   Examination:  Awake Alert, Oriented X 3, No new F.N deficits, Normal affect Symmetrical Chest wall movement, remains with scattered wheezing and rhonchi RRR,No Gallops,Rubs or new Murmurs, No Parasternal Heave +ve B.Sounds, Abd Soft, No tenderness, No rebound - guarding or rigidity. No Cyanosis, Clubbing or edema, No new Rash or bruise       Data Reviewed: I have personally reviewed following labs and imaging studies CBC: Recent Labs  Lab 04/17/21 1930 04/18/21 0250 04/19/21 0240 04/20/21 0217  WBC 6.8 7.7 5.7 4.6  NEUTROABS 5.3  --   --   --   HGB 12.4* 11.4* 10.4* 10.6*  HCT 38.6* 35.7* 31.6* 33.0*  MCV 92.8 93.0 92.9 92.2  PLT 246 231 228 [id number]   Basic Metabolic Panel: Recent Labs  Lab 04/17/21 1930 04/18/21 0250 04/19/21 0240 04/20/21 0217 04/21/21 0258  NA 137 136 139 140 140  K 3.4* 5.8* 3.6 3.9 3.7  CL 99 101 100 102  101  CO2 31 29 33* 30 29  GLUCOSE 120* 110* 104* 174* 212*  BUN '12 16 20 23 23  '$ CREATININE 1.78* 1.80* 2.03* 1.93* 2.00*  CALCIUM 9.0 8.4* 8.3* 8.5* 8.7*   GFR: Estimated Creatinine Clearance: 46.3 mL/min (A) (by C-G formula based on SCr of 2 mg/dL (H)). Liver Function Tests: Recent Labs  Lab 04/17/21 1930 04/19/21 0240  AST 27 19  ALT 25 20  ALKPHOS 83 62  BILITOT 0.5 0.7  PROT 6.6 5.6*  ALBUMIN 3.2* 2.6*   No results for input(s): LIPASE, AMYLASE in the last 168 hours. No results for input(s): AMMONIA in the last 168 hours. Coagulation Profile: No results for input(s): INR, PROTIME in the last 168 hours. Cardiac Enzymes: No results for input(s): CKTOTAL, CKMB, CKMBINDEX, TROPONINI in the last 168 hours. BNP (last 3 results) Recent Labs    03/30/21 0707 04/06/21 0725 04/14/21 0712  PROBNP 2,643* 5,653* 3,473*   HbA1C: No results for input(s): HGBA1C in the last 72 hours. CBG: No results for input(s): GLUCAP in the last 168 hours. Lipid Profile: No results for input(s): CHOL, HDL, LDLCALC, TRIG, CHOLHDL, LDLDIRECT in the last 72 hours. Thyroid Function Tests: No results for input(s): TSH, T4TOTAL, FREET4, T3FREE, THYROIDAB in the last 72 hours. Anemia Panel: No results for input(s): VITAMINB12, FOLATE, FERRITIN, TIBC, IRON, RETICCTPCT in the last 72 hours. Sepsis Labs: Recent Labs  Lab 04/18/21 0250 04/19/21 0240  PROCALCITON <0.10 <0.10    Recent Results (from the past 240 hour(s))  Resp Panel by RT-PCR (Flu A&B, Covid) Nasopharyngeal Swab     Status: None   Collection Time: 04/17/21  7:45 PM   Specimen: Nasopharyngeal Swab; Nasopharyngeal(NP) swabs in vial transport medium  Result Value Ref Range Status   SARS Coronavirus 2 by RT PCR NEGATIVE NEGATIVE Final    Comment: (NOTE) SARS-CoV-2 target nucleic acids are NOT DETECTED.  The SARS-CoV-2 RNA is generally detectable in upper respiratory specimens during the acute phase of infection. The  lowest concentration of SARS-CoV-2 viral copies this assay can detect is 138 copies/mL. A negative result does not preclude SARS-Cov-2 infection and should not be used as the sole basis  for treatment or other patient management decisions. A negative result may occur with  improper specimen collection/handling, submission of specimen other than nasopharyngeal swab, presence of viral mutation(s) within the areas targeted by this assay, and inadequate number of viral copies(<138 copies/mL). A negative result must be combined with clinical observations, patient history, and epidemiological information. The expected result is Negative.  Fact Sheet for Patients:  EntrepreneurPulse.com.au  Fact Sheet for Healthcare Providers:  IncredibleEmployment.be  This test is no t yet approved or cleared by the [place] FDA and  has been authorized for detection and/or diagnosis of SARS-CoV-2 by FDA under an Emergency Use Authorization (EUA). This EUA will remain  in effect (meaning this test can be used) for the duration of the COVID-19 declaration under Section 564(b)(1) of the Act, 21 U.S.C.section 360bbb-3(b)(1), unless the authorization is terminated  or revoked sooner.       Influenza A by PCR NEGATIVE NEGATIVE Final   Influenza B by PCR NEGATIVE NEGATIVE Final    Comment: (NOTE) The Xpert Xpress SARS-CoV-2/FLU/RSV plus assay is intended as an aid in the diagnosis of influenza from Nasopharyngeal swab specimens and should not be used as a sole basis for treatment. Nasal washings and aspirates are unacceptable for Xpert Xpress SARS-CoV-2/FLU/RSV testing.  Fact Sheet for Patients: EntrepreneurPulse.com.au  Fact Sheet for Healthcare Providers: IncredibleEmployment.be  This test is not yet approved or cleared by the [place] FDA and has been authorized for detection and/or diagnosis of SARS-CoV-2 by FDA under  an Emergency Use Authorization (EUA). This EUA will remain in effect (meaning this test can be used) for the duration of the COVID-19 declaration under Section 564(b)(1) of the Act, 21 U.S.C. section 360bbb-3(b)(1), unless the authorization is terminated or revoked.  Performed at [facility], [place] [address] [place], [place] [address]      Radiology Studies: CT CHEST WO CONTRAST  Result Date: 04/21/2021 CLINICAL DATA:  Inpatient. Dyspnea. COPD exacerbation. History of sarcoidosis. EXAM: CT CHEST WITHOUT CONTRAST TECHNIQUE: Multidetector CT imaging of the chest was performed following the standard protocol without IV contrast. COMPARISON:  04/17/2021 chest radiograph. 01/30/2013 chest CT angiogram. FINDINGS: Cardiovascular: Mild cardiomegaly. No significant pericardial effusion/thickening. Three-vessel coronary atherosclerosis. Mildly atherosclerotic thoracic aorta with dilated 4.4 cm ascending thoracic aorta. Dilated main pulmonary artery (4.1 cm diameter). Mediastinum/Nodes: No discrete thyroid nodules. Unremarkable esophagus. No axillary adenopathy. Mildly to moderately enlarged right paratracheal, prevascular, subcarinal and bilateral hilar lymph nodes, several of which are coarsely calcified, stable to mildly increased since 01/30/2013 chest CT and with many nodes increasingly calcified in the interval. Representative noncalcified enlarged 1.6 cm left prevascular node (series 2/image 63), mildly increased from 1.1 cm. Representative enlarged calcified 1.5 cm right paratracheal node, previously 1.2 cm, mildly increased in size and increased in calcification. Lungs/Pleura: No pneumothorax. No pleural effusion. Masslike 3.7 x 2.4 cm focus of consolidation in the anterior right lower lobe (series 3/image 105), new. Widespread patchy nodular thickening of the peribronchovascular interstitium throughout both lungs, most prominent in the upper lobes, with associated patchy peribronchovascular  ground-glass opacity, generally worsened in the interval. Representative indistinct peribronchovascular 0.7 cm posterior right upper lobe nodule (series 3/image 56), new. Nodular and bandlike peribronchovascular consolidation in the left lower lobe (series 3/image 114), new. No significant regions of traction bronchiectasis. Upper abdomen: Cholecystectomy. Musculoskeletal: No aggressive appearing focal osseous lesions. Moderate thoracic spondylosis. IMPRESSION: 1. Widespread patchy nodular thickening of the peribronchovascular interstitium throughout both lungs, most prominent in the upper lobes, with associated patchy  peribronchovascular ground-glass opacity. Masslike 3.7 cm focus of consolidation in the anterior right lower lobe, new. 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 Follow-up outpatient high-resolution chest CT suggested in 3-6 months. 2. Mild cardiomegaly. Three-vessel coronary atherosclerosis. 3. Dilated main pulmonary artery, suggesting pulmonary arterial hypertension. 4. Ectatic 4.4 cm ascending thoracic aorta. Recommend annual imaging followup by CTA or MRA. This recommendation follows 2010 ACCF/AHA/AATS/ACR/ASA/SCA/SCAI/SIR/STS/SVM Guidelines for the Diagnosis and Management of Patients with Thoracic Aortic Disease. Circulation. 2010; [id number]. Aortic aneurysm NOS (ICD10-I71.9). 5. Aortic Atherosclerosis (ICD10-I70.0). Electronically Signed   By: Ilona Sorrel M.D.   On: 04/21/2021 15:48     LOS: 4 days   Phillips Climes, MD [facility]  04/21/2021, 4:04 PM

## 2021-04-21 NOTE — Progress Notes (Signed)
Progress Note  Patient Name: [name] Date of Encounter: 04/21/2021  Attending physician: Albertine Patricia, MD Primary care provider: [name], MD  Subjective: [name] is a 65 y.o. male who was seen and examined at bedside  No events overnight. No chest pain.  Nasal cannula oxygen discontinued earlier this morning by RT. Did not require BiPAP support at night. Shortness of breath is much improved since admission.  Denies PND, LE swelling, and orthopena.  Case discussed and reviewed with his nurse.  Objective: Vital Signs in the last 24 hours: Temp:  [97.8 F (36.6 C)-98.7 F (37.1 C)] 97.8 F (36.6 C) (08/16 0422) Pulse Rate:  [62-69] 67 (08/16 0852) Resp:  [16-18] 16 (08/16 0852) BP: (131-141)/(72-88) 141/88 (08/16 0422) SpO2:  [92 %-100 %] 92 % (08/16 0852) Weight:  [102.8 kg] 102.8 kg (08/16 0225)  Intake/Output:  Intake/Output Summary (Last 24 hours) at 04/21/2021 1007 Last data filed at 04/21/2021 0426 Gross per 24 hour  Intake 240 ml  Output 2000 ml  Net -1760 ml    Net IO Since Admission: -3,780 mL [04/21/21 1007]  Weights:  Filed Weights   04/19/21 0407 04/20/21 0445 04/21/21 0225  Weight: 101.7 kg 102.5 kg 102.8 kg    Telemetry: Personally reviewed -normal sinus with rare PVCs  Physical examination: PHYSICAL EXAM: Vitals with BMI 04/21/2021 04/21/2021 04/21/2021  Height - - -  Weight - - 226 lbs 10 oz  BMI - - 99991111  Systolic - [id number] -  Diastolic - 88 -  Pulse 67 63 -    CONSTITUTIONAL: Well-developed and well-nourished. No acute distress.  SKIN: Skin is warm and dry. No rash noted. No cyanosis. No pallor. No jaundice HEAD: Normocephalic and atraumatic.  EYES: No scleral icterus MOUTH/THROAT: Moist oral membranes.  NECK: No JVD present. No thyromegaly noted. No carotid bruits  LYMPHATIC: No visible cervical adenopathy.  CHEST Normal respiratory effort. No intercostal retractions  LUNGS: Clear to auscultation bilaterally.  No  stridor. Mild expiratory wheezes. No rales.  CARDIOVASCULAR: Regular, positive S1-S2, no murmurs rubs or gallops appreciated. ABDOMINAL: Soft, nontender, nondistended, positive bowel sounds in all 4 quadrants, no apparent ascites.  EXTREMITIES: Trace bilateral peripheral edema, warm to touch bilaterally. HEMATOLOGIC: No significant bruising NEUROLOGIC: Oriented to person, place, and time. Nonfocal. Normal muscle tone.  PSYCHIATRIC: Normal mood and affect. Normal behavior. Cooperative  Lab Results: Hematology Recent Labs  Lab 04/18/21 0250 04/19/21 0240 04/20/21 0217  WBC 7.7 5.7 4.6  RBC 3.84* 3.40* 3.58*  HGB 11.4* 10.4* 10.6*  HCT 35.7* 31.6* 33.0*  MCV 93.0 92.9 92.2  MCH 29.7 30.6 29.6  MCHC 31.9 32.9 32.1  RDW 16.1* 16.0* 15.5  PLT 231 228 218    Chemistry Recent Labs  Lab 04/17/21 1930 04/18/21 0250 04/19/21 0240 04/20/21 0217 04/21/21 0258  NA 137   < > 139 140 140  K 3.4*   < > 3.6 3.9 3.7  CL 99   < > 100 102 101  CO2 31   < > 33* 30 29  GLUCOSE 120*   < > 104* 174* 212*  BUN 12   < > '20 23 23  '$ CREATININE 1.78*   < > 2.03* 1.93* 2.00*  CALCIUM 9.0   < > 8.3* 8.5* 8.7*  PROT 6.6  --  5.6*  --   --   ALBUMIN 3.2*  --  2.6*  --   --   AST 27  --  19  --   --  ALT 25  --  20  --   --   ALKPHOS 83  --  62  --   --   BILITOT 0.5  --  0.7  --   --   GFRNONAA 42*   < > 36* 38* 37*  ANIONGAP 7   < > '6 8 10   '$ < > = values in this interval not displayed.     Cardiac Enzymes: Cardiac Panel (last 3 results) No results for input(s): CKTOTAL, CKMB, TROPONINIHS, RELINDX in the last 72 hours.   BNP (last 3 results) Recent Labs    04/07/21 1251 04/09/21 0147 04/17/21 1930  BNP 3,953.6* 1,645.7* 2,729.0*    ProBNP (last 3 results) Recent Labs    03/30/21 0707 04/06/21 0725 04/14/21 0712  PROBNP 2,643* 5,653* 3,473*     DDimer  Recent Labs  Lab 04/17/21 1942  DDIMER 0.82*     Hemoglobin A1c:  Lab Results  Component Value Date   HGBA1C 5.7  (H) 04/08/2021   MPG 116.89 04/08/2021    TSH  Recent Labs    11/19/20 0233  TSH 1.247    Lipid Panel     Component Value Date/Time   CHOL 111 04/09/2021 0147   TRIG 53 04/09/2021 0147   HDL 62 04/09/2021 0147   CHOLHDL 1.8 04/09/2021 0147   VLDL 11 04/09/2021 0147   LDLCALC 38 04/09/2021 0147    Imaging: No results found.  Cardiac database: EKG 04/17/2021: Sinus rhythm 75 bpm Prolonged QT interval   Echocardiogram 03/23/2021:  1. Left ventricular ejection fraction, by estimation, is 60 to 65%. The  left ventricle has normal function. The left ventricle has no regional  wall motion abnormalities. There is mild left ventricular hypertrophy.  Left ventricular diastolic parameters  are consistent with Grade I diastolic dysfunction (impaired relaxation).   2. Right ventricular systolic function is normal. The right ventricular  size is normal. There is moderately elevated pulmonary artery systolic  pressure. The estimated right ventricular systolic pressure is 0000000 mmHg.   3. Left atrial size was severely dilated.   4. The mitral valve is normal in structure. Mild mitral valve  regurgitation. No evidence of mitral stenosis.   5. The aortic valve is calcified. Aortic valve regurgitation is mild. No  aortic stenosis is present.   6. The inferior vena cava is dilated in size with <50% respiratory  variability, suggesting right atrial pressure of 15 mmHg.  Scheduled Meds:  aspirin  81 mg Oral Daily   atorvastatin  20 mg Oral Daily   dapagliflozin propanediol  10 mg Oral Daily   diltiazem  360 mg Oral Daily   enoxaparin (LOVENOX) injection  40 mg Subcutaneous Q24H   guaiFENesin  600 mg Oral BID   isosorbide-hydrALAZINE  1 tablet Oral TID   labetalol  300 mg Oral TID   methylPREDNISolone (SOLU-MEDROL) injection  80 mg Intravenous Q24H   nicotine  7 mg Transdermal Daily   pantoprazole  40 mg Oral Daily   polyethylene glycol  17 g Oral Daily   senna-docusate  1 tablet  Oral BID   sodium chloride flush  3 mL Intravenous Q12H   tamsulosin  0.4 mg Oral Daily    Continuous Infusions:  sodium chloride      PRN Meds: sodium chloride, acetaminophen **OR** acetaminophen, ipratropium-albuterol, magnesium hydroxide, menthol-cetylpyridinium, senna-docusate, sodium chloride, sodium chloride flush, traZODone   IMPRESSION & RECOMMENDATIONS: [name] is a 65 y.o. male whose past medical history and cardiac  risk factors include: Sarcoidosis, HTN, Tobacco abuse, Hx of PE (was on Xarelto for 1 year), OSA not on CPAP, MSSA Bacteremia (March 2022), Hx of cardiac arrest (2002 during his hospitalization for pancreatitis per wife).  Acute on chronic heart failure with preserved EF, stage C, NYHA class II: Net negative urine output of 3.7 L Educated on the importance of low-salt diet, compliance with medication Continue labetalol, BiDil, and [place]. Entresto currently held secondary to AKI. Will d/c Bumex due to AKI as well.  Had a long discussion with the patient with regards to recurrent hospitalization for congestive heart failure over the last 1.5-2 months which is most likely due to dietary indiscretion, questionable medication compliance. Most recent echocardiogram results reviewed and noted above. Recommend ambulating on the floors today.  Ted hose stockings to help mobilize LE swelling.   Acute hypoxic respiratory failure: Multifactorial: Underlying COPD, history of smoking, history of OSA not on CPAP, recurrent hospitalizations in the recent past predisposing him to HCAP, and acute HFpEF exacerbation. Heart failure management as discussed above. He reached out to sleep medicine and has an appt with them next week.  Patient has improved clinically with administration of steroids and bronchodilators suggesting that this time it is hospitalizations are most likely due to pulmonary as opposed to cardiac conditions.   Recommend pulmonary evaluation   Acute  kidney injury: Most likely secondary to intravascular depletion due to diuresis Hold Bumex and Entresto for now Would like resolution of AKI before restarting of diuretics prior to discharge  Benign essential hypertension: Blood pressure within acceptable range.  Plan of care discussed with attending physician as well.   Patient's questions and concerns were addressed to his satisfaction. He voices understanding of the instructions provided during this encounter.   This note was created using a voice recognition software as a result there may be grammatical errors inadvertently enclosed that do not reflect the nature of this encounter. Every attempt is made to correct such errors.  Total time spent: 37 minutes   Manuel Lipari Eastman, DO, [facility]. [place] Office: [phone] 04/21/2021, 10:07 AM

## 2021-04-22 ENCOUNTER — Other Ambulatory Visit: Payer: Self-pay | Admitting: Student

## 2021-04-22 ENCOUNTER — Telehealth: Payer: Self-pay | Admitting: Pulmonary Disease

## 2021-04-22 DIAGNOSIS — N179 Acute kidney failure, unspecified: Secondary | ICD-10-CM

## 2021-04-22 DIAGNOSIS — I5031 Acute diastolic (congestive) heart failure: Secondary | ICD-10-CM

## 2021-04-22 DIAGNOSIS — N1832 Chronic kidney disease, stage 3b: Secondary | ICD-10-CM

## 2021-04-22 LAB — BASIC METABOLIC PANEL
Anion gap: 6 (ref 5–15)
BUN: 25 mg/dL — ABNORMAL HIGH (ref 8–23)
CO2: 31 mmol/L (ref 22–32)
Calcium: 8.6 mg/dL — ABNORMAL LOW (ref 8.9–10.3)
Chloride: 101 mmol/L (ref 98–111)
Creatinine, Ser: 1.95 mg/dL — ABNORMAL HIGH (ref 0.61–1.24)
GFR, Estimated: 38 mL/min — ABNORMAL LOW (ref >60)
Glucose, Bld: 163 mg/dL — ABNORMAL HIGH (ref 70–99)
Potassium: 3.6 mmol/L (ref 3.5–5.1)
Sodium: 138 mmol/L (ref 135–145)

## 2021-04-22 LAB — CBC
HCT: 30.8 % — ABNORMAL LOW (ref 39.0–52.0)
Hemoglobin: 10.2 g/dL — ABNORMAL LOW (ref 13.0–17.0)
MCH: 30 pg (ref 26.0–34.0)
MCHC: 33.1 g/dL (ref 30.0–36.0)
MCV: 90.6 fL (ref 80.0–100.0)
Platelets: 221 10*3/uL (ref 150–400)
RBC: 3.4 MIL/uL — ABNORMAL LOW (ref 4.22–5.81)
RDW: 15.7 % — ABNORMAL HIGH (ref 11.5–15.5)
WBC: 10 10*3/uL (ref 4.0–10.5)
nRBC: 0 % (ref 0.0–0.2)

## 2021-04-22 LAB — BRAIN NATRIURETIC PEPTIDE: B Natriuretic Peptide: 1205.3 pg/mL — ABNORMAL HIGH (ref 0.0–100.0)

## 2021-04-22 MED ORDER — BUMETANIDE 0.25 MG/ML IJ SOLN
0.5000 mg | Freq: Once | INTRAMUSCULAR | Status: DC
  Filled 2021-04-22: qty 2

## 2021-04-22 MED ORDER — BUMETANIDE 1 MG PO TABS
1.0000 mg | ORAL_TABLET | Freq: Every day | ORAL | Status: DC
  Administered 2021-04-22 – 2021-04-23 (×2): 1 mg via ORAL
  Filled 2021-04-22 (×2): qty 1

## 2021-04-22 MED ORDER — METOPROLOL SUCCINATE ER 100 MG PO TB24
200.0000 mg | ORAL_TABLET | Freq: Every day | ORAL | Status: DC
  Administered 2021-04-22 – 2021-04-23 (×2): 200 mg via ORAL
  Filled 2021-04-22 (×2): qty 2

## 2021-04-22 NOTE — Progress Notes (Signed)
PROGRESS NOTE    [name]  [id number] DOB: [birth year] DOA: 04/17/2021 PCP: [name], MD   Chief Complaint  Patient presents with   Shortness of Breath    Brief Narrative: 65 year old male with history of diastolic CHF, sarcoidosis, hypertension, hyperlipidemia, history of CVA, CKD stage IIIb, presented to the ED with shortness of breath on the day of admission was hypoxic on arrival at 70%.  He has been having productive cough, shortness of breath nasal congestion x2 days, was seen in the cardiology office with he was short of breath appears to have upper respiratory infection with symptoms, also hypoxic and sent to the ED. In the ED, work-up showed creatinine 1.78, BNP 2729 troponin negative x2, labs no leukocytosis w/ chronic anemia, d dimer 0.8, COVID-19 influenza screen negative.CXR showed  "Cardiomegaly with vascular congestion, pulmonary edema and possible small right effusion".  Last admission 8/2-8/4 for acute on chronic CHF exacerbation, acute hypoxic respiratory failure managed with IV Bumex in the [facility] cardiology discharged home  In the ED patient was placed on BiPAP, IV Bumex was given and admitted.  Subjective:  No BiPAP requirement for last 48 hours, he is on room air, ambulated in the hallway today with no hypoxia on room air.   Assessment & Plan:  Acute on chronic heart failure with preserved ejection fraction Patient needing BiPAP initially this has improved, no BiPAP requirement over the last 24 hours -Cardiology input greatly appreciated, diuresis per cardiology, initially on hold given worsening renal function, now renal function has stabilized to receive Bumex today.  . -He is -5.2 L during hospital stay -holding Entresto given elevated potassium worsening renal function - continue BiDil Farxiga, labetalol has been changed to metoprolol XL 200 mg oral daily by cardiology -Delene Loll remains on hold given AKI and hyperkalemia on presentation.   Net  IO Since Admission: -5,270 mL [04/22/21 1620]   Intake/Output Summary (Last 24 hours) at 04/22/2021 1620 Last data filed at 04/22/2021 [id number] Gross per 24 hour  Intake 240 ml  Output 1730 ml  Net -1490 ml    Filed Weights   04/20/21 0445 04/21/21 0225 04/22/21 0331  Weight: 102.5 kg 102.8 kg 103.8 kg      03/30/21 0707 04/06/21 0725 04/14/21 0712  PROBNP 2,643* [id number]* 3,473*   Acute hypoxemic respiratory failure:  -Required BiPAP initially, currently improved after IV diuresis and steroids -Mutlifactorial, in the setting of volume overload due to CHF, COPD  exacerbation, and pulmonary sarcoidosis.  COPD exacerbation -Improving with IV steroids and scheduled duo nebs.     Low-grade fever ? URTI viral no leukocytosis, check procalcitonin and if high will add antibiotics. It seems he took dose of zeepack form pcp recently.  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  CT chest significant for widespread patchy nodular thickening, mass 3.7 focus of consolidation anterior right lower lobe, being suspicious for progressive pulmonary sarcoidosis, appears to be on pulmonary sarcoidosis exacerbation, will need prolonged prednisone, PCCM will arrange for outpatient follow-up.  Ectatic 4.4 cm ascending thoracic aorta. - radiology  Recommend annual imaging followup by CTA or MRA.  Essential hypertension: Blood pressure stable cont his  labetalol bidil   as above  Mild Hyperkalemia: Avoid potassium supplementation.  Hold entresto today. Repeat labs in am.  CKD Stage 3b: Renal function remains close to baseline as below.  Monitor while on diuretics Recent Labs    04/06/21 0726 04/07/21 1251 04/08/21 0232 04/14/21 0712 04/17/21 1930 04/18/21 0250  04/19/21 0240 04/20/21 0217 04/21/21 0258 04/22/21 0149  BUN '16 15 17 14 12 16 20 23 23 '$ 25*  CREATININE 1.53* 1.63* 1.63* 1.79* 1.78* 1.80* 2.03* 1.93* 2.00* 1.95*    Tobacco abuse: Advised  cessation-he is actively quitting smoking.  Prolonged QT interval: Monitor , avoid QT prolonging medication.    Morbid Obesity w/ BMI 30: Patient will benefit  Monitor closely.  At risk of decompensation, low threshold for ICU evaluation  Diet Order             Diet Heart Room service appropriate? Yes; Fluid consistency: Thin  Diet effective now                   Patient's Body mass index is 31.04 kg/m.  DVT prophylaxis: Place TED hose Start: 04/21/21 1008 enoxaparin (LOVENOX) injection 40 mg Start: 04/18/21 1215lovenox Code Status:   Code Status: Full Code  Family Communication: plan of care discussed with patient at bedside. Status is: Inpatient Remains inpatient appropriate because:IV treatments appropriate due to intensity of illness or inability to take PO and Inpatient level of care appropriate due to severity of illness  Dispo: The patient is from: Home              Anticipated d/c is to: Home              Patient currently is not medically stable to d/c.   Difficult to place patient No    Unresulted Labs (From admission, onward)     Start     Ordered   04/23/21 [id number]  Basic metabolic panel  Once,   R       Question:  Specimen collection method  Answer:  Lab=Lab collect   04/22/21 1415           Medications reviewed:  Scheduled Meds:  aspirin  81 mg Oral Daily   atorvastatin  20 mg Oral Daily   bumetanide (BUMEX) IV  0.5 mg Intravenous Once   bumetanide  1 mg Oral Daily   dapagliflozin propanediol  10 mg Oral Daily   diltiazem  360 mg Oral Daily   enoxaparin (LOVENOX) injection  40 mg Subcutaneous Q24H   guaiFENesin  600 mg Oral BID   isosorbide-hydrALAZINE  1 tablet Oral TID   metoprolol succinate  200 mg Oral Daily   mometasone-formoterol  2 puff Inhalation BID   nicotine  7 mg Transdermal Daily   pantoprazole  40 mg Oral Daily   predniSONE  40 mg Oral Q breakfast   senna-docusate  1 tablet Oral BID   sodium chloride flush  3 mL Intravenous Q12H    tamsulosin  0.4 mg Oral Daily   Continuous Infusions:  sodium chloride     Consultants:see note  Procedures:see note Antimicrobials: Anti-infectives (From admission, onward)    None      Culture/Microbiology    Component Value Date/Time   SDES BLOOD RIGHT HAND 11/19/2020 1828   SPECREQUEST  11/19/2020 1828    BOTTLES DRAWN AEROBIC AND ANAEROBIC Blood Culture results may not be optimal due to an inadequate volume of blood received in culture bottles   CULT  11/19/2020 1828    NO GROWTH 5 DAYS Performed at [facility], [place] [address], [place], [place] [address]    REPTSTATUS 11/24/2020 FINAL 11/19/2020 1828    Other culture-see note  Objective: Vitals: Today's Vitals   04/21/21 2010 04/21/21 2145 04/22/21 0331 04/22/21 0945  BP: (!) 152/91  (!) 156/90 Marland Kitchen)  151/89  Pulse: 60  65 67  Resp: '18  18 20  '$ Temp: 98.6 F (37 C)  98.3 F (36.8 C) 98.3 F (36.8 C)  TempSrc: Oral  Oral Oral  SpO2: 95%  91% 96%  Weight:   103.8 kg   Height:      PainSc:  0-No pain  0-No pain    Intake/Output Summary (Last 24 hours) at 04/22/2021 1620 Last data filed at 04/22/2021 [id number] Gross per 24 hour  Intake 240 ml  Output 1730 ml  Net -1490 ml   Filed Weights   04/20/21 0445 04/21/21 0225 04/22/21 0331  Weight: 102.5 kg 102.8 kg 103.8 kg   Weight change: 1.043 kg  Intake/Output from previous day: 08/16 0701 - 08/17 0700 In: 240 [P.O.:240] Out: 1730 [Urine:1730] Intake/Output this shift: No intake/output data recorded. Filed Weights   04/20/21 0445 04/21/21 0225 04/22/21 0331  Weight: 102.5 kg 102.8 kg 103.8 kg   Examination:  Awake Alert, Oriented X 3, No new F.N deficits, Normal affect Symmetrical Chest wall movement, Good air movement bilaterally, scattered rhonchi/wheezing has improved RRR,No Gallops,Rubs or new Murmurs, No Parasternal Heave +ve B.Sounds, Abd Soft, No tenderness, No rebound - guarding or rigidity. No Cyanosis, Clubbing or edema, No new Rash or  bruise       Data Reviewed: I have personally reviewed following labs and imaging studies CBC: Recent Labs  Lab 04/17/21 1930 04/18/21 0250 04/19/21 0240 04/20/21 0217 04/22/21 0149  WBC 6.8 7.7 5.7 4.6 10.0  NEUTROABS 5.3  --   --   --   --   HGB 12.4* 11.4* 10.4* 10.6* 10.2*  HCT 38.6* 35.7* 31.6* 33.0* 30.8*  MCV 92.8 93.0 92.9 92.2 90.6  PLT 246 231 228 218 [id number]   Basic Metabolic Panel: Recent Labs  Lab 04/18/21 0250 04/19/21 0240 04/20/21 0217 04/21/21 0258 04/22/21 0149  NA 136 139 140 140 138  K 5.8* 3.6 3.9 3.7 3.6  CL 101 100 102 101 101  CO2 29 33* '30 29 31  '$ GLUCOSE 110* 104* 174* 212* 163*  BUN '16 20 23 23 '$ 25*  CREATININE 1.80* 2.03* 1.93* 2.00* 1.95*  CALCIUM 8.4* 8.3* 8.5* 8.7* 8.6*   GFR: Estimated Creatinine Clearance: 47.7 mL/min (A) (by C-G formula based on SCr of 1.95 mg/dL (H)). Liver Function Tests: Recent Labs  Lab 04/17/21 1930 04/19/21 0240  AST 27 19  ALT 25 20  ALKPHOS 83 62  BILITOT 0.5 0.7  PROT 6.6 5.6*  ALBUMIN 3.2* 2.6*   No results for input(s): LIPASE, AMYLASE in the last 168 hours. No results for input(s): AMMONIA in the last 168 hours. Coagulation Profile: No results for input(s): INR, PROTIME in the last 168 hours. Cardiac Enzymes: No results for input(s): CKTOTAL, CKMB, CKMBINDEX, TROPONINI in the last 168 hours. BNP (last 3 results) Recent Labs    03/30/21 0707 04/06/21 0725 04/14/21 0712  PROBNP 2,643* 5,653* 3,473*   HbA1C: No results for input(s): HGBA1C in the last 72 hours. CBG: No results for input(s): GLUCAP in the last 168 hours. Lipid Profile: No results for input(s): CHOL, HDL, LDLCALC, TRIG, CHOLHDL, LDLDIRECT in the last 72 hours. Thyroid Function Tests: No results for input(s): TSH, T4TOTAL, FREET4, T3FREE, THYROIDAB in the last 72 hours. Anemia Panel: No results for input(s): VITAMINB12, FOLATE, FERRITIN, TIBC, IRON, RETICCTPCT in the last 72 hours. Sepsis Labs: Recent Labs  Lab  04/18/21 0250 04/19/21 0240  PROCALCITON <0.10 <0.10    Recent Results (from the past 240  hour(s))  Resp Panel by RT-PCR (Flu A&B, Covid) Nasopharyngeal Swab     Status: None   Collection Time: 04/17/21  7:45 PM   Specimen: Nasopharyngeal Swab; Nasopharyngeal(NP) swabs in vial transport medium  Result Value Ref Range Status   SARS Coronavirus 2 by RT PCR NEGATIVE NEGATIVE Final    Comment: (NOTE) SARS-CoV-2 target nucleic acids are NOT DETECTED.  The SARS-CoV-2 RNA is generally detectable in upper respiratory specimens during the acute phase of infection. The lowest concentration of SARS-CoV-2 viral copies this assay can detect is 138 copies/mL. A negative result does not preclude SARS-Cov-2 infection and should not be used as the sole basis for treatment or other patient management decisions. A negative result may occur with  improper specimen collection/handling, submission of specimen other than nasopharyngeal swab, presence of viral mutation(s) within the areas targeted by this assay, and inadequate number of viral copies(<138 copies/mL). A negative result must be combined with clinical observations, patient history, and epidemiological information. The expected result is Negative.  Fact Sheet for Patients:  EntrepreneurPulse.com.au  Fact Sheet for Healthcare Providers:  IncredibleEmployment.be  This test is no t yet approved or cleared by the [place] FDA and  has been authorized for detection and/or diagnosis of SARS-CoV-2 by FDA under an Emergency Use Authorization (EUA). This EUA will remain  in effect (meaning this test can be used) for the duration of the COVID-19 declaration under Section 564(b)(1) of the Act, 21 U.S.C.section 360bbb-3(b)(1), unless the authorization is terminated  or revoked sooner.       Influenza A by PCR NEGATIVE NEGATIVE Final   Influenza B by PCR NEGATIVE NEGATIVE Final    Comment: (NOTE) The  Xpert Xpress SARS-CoV-2/FLU/RSV plus assay is intended as an aid in the diagnosis of influenza from Nasopharyngeal swab specimens and should not be used as a sole basis for treatment. Nasal washings and aspirates are unacceptable for Xpert Xpress SARS-CoV-2/FLU/RSV testing.  Fact Sheet for Patients: EntrepreneurPulse.com.au  Fact Sheet for Healthcare Providers: IncredibleEmployment.be  This test is not yet approved or cleared by the [place] FDA and has been authorized for detection and/or diagnosis of SARS-CoV-2 by FDA under an Emergency Use Authorization (EUA). This EUA will remain in effect (meaning this test can be used) for the duration of the COVID-19 declaration under Section 564(b)(1) of the Act, 21 U.S.C. section 360bbb-3(b)(1), unless the authorization is terminated or revoked.  Performed at [facility], [place] [address], [place], [place] [address]      Radiology Studies: CT CHEST WO CONTRAST  Result Date: 04/21/2021 CLINICAL DATA:  Inpatient. Dyspnea. COPD exacerbation. History of sarcoidosis. EXAM: CT CHEST WITHOUT CONTRAST TECHNIQUE: Multidetector CT imaging of the chest was performed following the standard protocol without IV contrast. COMPARISON:  04/17/2021 chest radiograph. 01/30/2013 chest CT angiogram. FINDINGS: Cardiovascular: Mild cardiomegaly. No significant pericardial effusion/thickening. Three-vessel coronary atherosclerosis. Mildly atherosclerotic thoracic aorta with dilated 4.4 cm ascending thoracic aorta. Dilated main pulmonary artery (4.1 cm diameter). Mediastinum/Nodes: No discrete thyroid nodules. Unremarkable esophagus. No axillary adenopathy. Mildly to moderately enlarged right paratracheal, prevascular, subcarinal and bilateral hilar lymph nodes, several of which are coarsely calcified, stable to mildly increased since 01/30/2013 chest CT and with many nodes increasingly calcified in the interval.  Representative noncalcified enlarged 1.6 cm left prevascular node (series 2/image 63), mildly increased from 1.1 cm. Representative enlarged calcified 1.5 cm right paratracheal node, previously 1.2 cm, mildly increased in size and increased in calcification. Lungs/Pleura: No pneumothorax. No pleural effusion. Masslike 3.7  x 2.4 cm focus of consolidation in the anterior right lower lobe (series 3/image 105), new. Widespread patchy nodular thickening of the peribronchovascular interstitium throughout both lungs, most prominent in the upper lobes, with associated patchy peribronchovascular ground-glass opacity, generally worsened in the interval. Representative indistinct peribronchovascular 0.7 cm posterior right upper lobe nodule (series 3/image 56), new. Nodular and bandlike peribronchovascular consolidation in the left lower lobe (series 3/image 114), new. No significant regions of traction bronchiectasis. Upper abdomen: Cholecystectomy. Musculoskeletal: No aggressive appearing focal osseous lesions. Moderate thoracic spondylosis. IMPRESSION: 1. Widespread patchy nodular thickening of the peribronchovascular interstitium throughout both lungs, most prominent in the upper lobes, with associated patchy peribronchovascular ground-glass opacity. Masslike 3.7 cm focus of consolidation in the anterior right lower lobe, new. 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 Follow-up outpatient high-resolution chest CT suggested in 3-6 months. 2. Mild cardiomegaly. Three-vessel coronary atherosclerosis. 3. Dilated main pulmonary artery, suggesting pulmonary arterial hypertension. 4. Ectatic 4.4 cm ascending thoracic aorta. Recommend annual imaging followup by CTA or MRA. This recommendation follows 2010 ACCF/AHA/AATS/ACR/ASA/SCA/SCAI/SIR/STS/SVM Guidelines for the Diagnosis and Management of Patients with Thoracic Aortic Disease. Circulation. 2010; [id number]. Aortic aneurysm  NOS (ICD10-I71.9). 5. Aortic Atherosclerosis (ICD10-I70.0). Electronically Signed   By: Ilona Sorrel M.D.   On: 04/21/2021 15:48     LOS: 5 days   Phillips Climes, MD [facility]  04/22/2021, 4:20 PM

## 2021-04-22 NOTE — Progress Notes (Signed)
Progress Note  Patient Name: [name] Date of Encounter: 04/22/2021  Attending physician: Albertine Patricia, MD Primary care provider: [name], MD  Subjective: [name] is a 65 y.o. male who was seen and examined at bedside at approximately 8 AM Patient sitting up resting comfortably eating breakfast. Denies chest pain, reports shortness of breath has significantly improved.  Continues to have bilateral lower leg edema. Patient presently on room air. Walked in the hallway twice yesterday without dyspnea.  Objective: Vital Signs in the last 24 hours: Temp:  [97.7 F (36.5 C)-98.6 F (37 C)] 98.3 F (36.8 C) (08/17 0331) Pulse Rate:  [60-67] 65 (08/17 0331) Resp:  [16-18] 18 (08/17 0331) BP: (145-156)/(84-91) 156/90 (08/17 0331) SpO2:  [91 %-96 %] 91 % (08/17 0331) Weight:  [103.8 kg] 103.8 kg (08/17 0331)  Intake/Output:  Intake/Output Summary (Last 24 hours) at 04/22/2021 0741 Last data filed at 04/22/2021 0625 Gross per 24 hour  Intake 240 ml  Output 1730 ml  Net -1490 ml    Net IO Since Admission: -5,270 mL [04/22/21 0741]  Weights:  Filed Weights   04/20/21 0445 04/21/21 0225 04/22/21 0331  Weight: 102.5 kg 102.8 kg 103.8 kg    Telemetry: Normal sinus rhythm with occasional PVCs  Physical examination: PHYSICAL EXAM: Vitals with BMI 04/22/2021 04/21/2021 04/21/2021  Height - - -  Weight 228 lbs 14 oz - -  BMI [id number] - -  Systolic [id number] 0000000 [id number]  Diastolic 90 91 84  Pulse 65 60 61   Physical Exam Vitals reviewed.  HENT:     Head: Normocephalic and atraumatic.  Neck:     Vascular: JVD present.  Cardiovascular:     Rate and Rhythm: Normal rate and regular rhythm.     Pulses: Intact distal pulses.     Heart sounds: S1 normal and S2 normal. No murmur heard.   No gallop.  Pulmonary:     Effort: Pulmonary effort is normal. No respiratory distress.     Breath sounds: No wheezing, rhonchi or rales.  Abdominal:     General: Bowel sounds are  normal.     Tenderness: There is no abdominal tenderness.  Musculoskeletal:     Right lower leg: Edema (1+ pitting) present.     Left lower leg: Edema (1+ pitting) present.  Skin:    General: Skin is warm and dry.  Neurological:     Mental Status: He is alert.    Lab Results: Hematology Recent Labs  Lab 04/19/21 0240 04/20/21 0217 04/22/21 0149  WBC 5.7 4.6 10.0  RBC 3.40* 3.58* 3.40*  HGB 10.4* 10.6* 10.2*  HCT 31.6* 33.0* 30.8*  MCV 92.9 92.2 90.6  MCH 30.6 29.6 30.0  MCHC 32.9 32.1 33.1  RDW 16.0* 15.5 15.7*  PLT 228 218 221    Chemistry Recent Labs  Lab 04/17/21 1930 04/18/21 0250 04/19/21 0240 04/20/21 0217 04/21/21 0258 04/22/21 0149  NA 137   < > 139 140 140 138  K 3.4*   < > 3.6 3.9 3.7 3.6  CL 99   < > 100 102 101 101  CO2 31   < > 33* '30 29 31  '$ GLUCOSE 120*   < > 104* 174* 212* 163*  BUN 12   < > '20 23 23 '$ 25*  CREATININE 1.78*   < > 2.03* 1.93* 2.00* 1.95*  CALCIUM 9.0   < > 8.3* 8.5* 8.7* 8.6*  PROT 6.6  --  5.6*  --   --   --  ALBUMIN 3.2*  --  2.6*  --   --   --   AST 27  --  19  --   --   --   ALT 25  --  20  --   --   --   ALKPHOS 83  --  62  --   --   --   BILITOT 0.5  --  0.7  --   --   --   GFRNONAA 42*   < > 36* 38* 37* 38*  ANIONGAP 7   < > '6 8 10 6   '$ < > = values in this interval not displayed.     Cardiac Enzymes: Cardiac Panel (last 3 results) No results for input(s): CKTOTAL, CKMB, TROPONINIHS, RELINDX in the last 72 hours.   BNP (last 3 results) Recent Labs    04/07/21 1251 04/09/21 0147 04/17/21 1930  BNP 3,953.6* 1,645.7* 2,729.0*    ProBNP (last 3 results) Recent Labs    03/30/21 0707 04/06/21 0725 04/14/21 0712  PROBNP 2,643* 5,653* 3,473*     DDimer  Recent Labs  Lab 04/17/21 1942  DDIMER 0.82*     Hemoglobin A1c:  Lab Results  Component Value Date   HGBA1C 5.7 (H) 04/08/2021   MPG 116.89 04/08/2021    TSH  Recent Labs    11/19/20 0233  TSH 1.247    Lipid Panel     Component Value  Date/Time   CHOL 111 04/09/2021 0147   TRIG 53 04/09/2021 0147   HDL 62 04/09/2021 0147   CHOLHDL 1.8 04/09/2021 0147   VLDL 11 04/09/2021 0147   LDLCALC 38 04/09/2021 0147    Imaging: CT CHEST WO CONTRAST 04/21/2021 1. Widespread patchy nodular thickening of the peribronchovascular interstitium throughout both lungs, most prominent in the upper lobes, with associated patchy peribronchovascular ground-glass opacity. Masslike 3.7 cm focus of consolidation in the anterior right lower lobe, new. 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 Follow-up outpatient high-resolution chest CT suggested in 3-6 months.  2. Mild cardiomegaly. Three-vessel coronary atherosclerosis.  3. Dilated main pulmonary artery, suggesting pulmonary arterial hypertension.  4. Ectatic 4.4 cm ascending thoracic aorta. Recommend annual imaging followup by CTA or MRA. This recommendation follows 2010 ACCF/AHA/AATS/ACR/ASA/SCA/SCAI/SIR/STS/SVM Guidelines for the Diagnosis and Management of Patients with Thoracic Aortic Disease. Circulation. 2010; [id number]. Aortic aneurysm NOS (ICD10-I71.9).  5. Aortic Atherosclerosis (ICD10-I70.0).   Chest x-ray 04/17/2021: Cardiomegaly with vascular congestion, pulmonary edema and possible small right effusion  Cardiac database: EKG 04/17/2021: Sinus rhythm 75 bpm Prolonged QT interval   Echocardiogram 03/23/2021:  1. Left ventricular ejection fraction, by estimation, is 60 to 65%. The  left ventricle has normal function. The left ventricle has no regional  wall motion abnormalities. There is mild left ventricular hypertrophy.  Left ventricular diastolic parameters  are consistent with Grade I diastolic dysfunction (impaired relaxation).   2. Right ventricular systolic function is normal. The right ventricular  size is normal. There is moderately elevated pulmonary artery systolic  pressure. The estimated right ventricular systolic  pressure is 0000000 mmHg.   3. Left atrial size was severely dilated.   4. The mitral valve is normal in structure. Mild mitral valve  regurgitation. No evidence of mitral stenosis.   5. The aortic valve is calcified. Aortic valve regurgitation is mild. No  aortic stenosis is present.   6. The inferior vena cava is dilated in size with <50% respiratory  variability, suggesting right atrial pressure of 15 mmHg.  Scheduled Meds:  aspirin  81 mg Oral Daily   atorvastatin  20 mg Oral Daily   dapagliflozin propanediol  10 mg Oral Daily   diltiazem  360 mg Oral Daily   enoxaparin (LOVENOX) injection  40 mg Subcutaneous Q24H   guaiFENesin  600 mg Oral BID   isosorbide-hydrALAZINE  1 tablet Oral TID   labetalol  300 mg Oral TID   mometasone-formoterol  2 puff Inhalation BID   nicotine  7 mg Transdermal Daily   pantoprazole  40 mg Oral Daily   polyethylene glycol  17 g Oral Daily   predniSONE  40 mg Oral Q breakfast   senna-docusate  1 tablet Oral BID   sodium chloride flush  3 mL Intravenous Q12H   tamsulosin  0.4 mg Oral Daily    Continuous Infusions:  sodium chloride      PRN Meds: sodium chloride, acetaminophen **OR** acetaminophen, ipratropium-albuterol, magnesium hydroxide, menthol-cetylpyridinium, senna-docusate, sodium chloride, sodium chloride flush, traZODone   IMPRESSION & RECOMMENDATIONS: [name] is a 65 y.o. male whose past medical history and cardiac risk factors include: Sarcoidosis, HTN, Tobacco abuse, Hx of PE (was on Xarelto for 1 year), OSA not on CPAP, MSSA Bacteremia (March 2022), Hx of cardiac arrest (2002 during his hospitalization for pancreatitis per wife).  Acute on chronic heart failure with preserved EF, stage C, NYHA class II: Net negative urine output of 5.2 L Continue BiDil and Farxiga Switch patient from labetalol to metoprolol succinate 20 mg daily Patient appears more volume overloaded on exam compared to yesterday.  We will give single dose  of IV Bumex at this time. Will obtain BNP.  If BNP >1000 we will initiate Bumex 1 mg p.o. twice daily.  If BMP <1000 we will start Bumex 1 mg p.o. daily. Continue to monitor renal function closely.  Patient's creatinine has improved today.  Continue strict INO's, daily weights, low-sodium diet, TED hose Again discussed with patient importance of dietary compliance, tobacco cessation, and weight loss. Will continue to hold Entresto   Acute hypoxic respiratory failure: Likely multifactorial including underlying COPD, OSA (not on CPAP), and acute HFpEF exacerbation. Continue HFpEF management as per above Patient is aware of the importance of scheduling for follow-up with sleep medicine, has appointment tonight, but will reschedule this for 2 weeks from now. Patient's dyspnea has significantly improved. Pulmonology now on board, recommendations appreciated.  Acute kidney injury: Creatinine improved today, review of renal function history with Dr. Einar Gip shows patient's renal function is relatively stable. Will continue to hold Entresto Will reinitiate gentle diuresis, and continue to monitor renal function closely.  Benign essential hypertension: Blood pressure within acceptable range.  We will continue to monitor closely with switch from labetalol to metoprolol.  Patient was seen in collaboration with Dr. Einar Gip. He also reviewed patient's chart and examined the patient. Dr. Einar Gip is in agreement of the plan.     Alethia Berthold, PA-C 04/22/2021, 9:33 AM Office: [phone]

## 2021-04-22 NOTE — Telephone Encounter (Signed)
Appt scheduled for the pt on 09/07 with BI at 1030.  Will forward to JD to make him aware.

## 2021-04-22 NOTE — Telephone Encounter (Signed)
Please schedule patient for hospital follow-up with either Dr. Valeta Harms or Dr. Lamonte Sakai in the next 2 to 4 weeks for lung mass and sarcoidosis.  Freda Jackson, MD [facility] Office: [phone]   See Amion for personal pager PCCM on call pager [phone] until 7pm. Please call Elink 7p-7a. [phone]

## 2021-04-22 NOTE — Progress Notes (Signed)
Heart Failure Stewardship Pharmacist Progress Note   PCP: [name], MD PCP-Cardiologist: None    HPI:  65 yo M with PMH of CHF, sarcoidosis, HTN, HLD, CVA, and CKD IIIb. He presented to the ED on 8/12 with shortness of breath, cough, and LE edema.  Recent hospitalization from 8/2-8/4 for acute on chronic diastolic heart failure. His last ECHO was done on 03/23/21 and LVEF was 60-65% (relatively stable from prior [facility]). Concern for dietary indiscretion vs medication noncompliance. CXR on admission significant for CHF.   Current HF Medications: Bumetanide 0.5 mg IV x 1 Metoprolol XL 200 mg daily Farxiga 10 mg daily BiDil 20/37.5 mg 1 tab TID  Prior to admission HF Medications: Bumetanide 1 mg BID Entresto 49/51 mg BID BiDil 20/37.5 mg 1 tab TID Farxiga 10 mg daily  Pertinent Lab Values: Serum creatinine 1.95 (baseline ~1.6-1.8), BUN 25, Potassium 3.6, Sodium 138, BNP 2729.0  Vital Signs: Weight: 228 lbs (admission weight: 224 lbs) Blood pressure: 150/80s  Heart rate: 60s   Medication Assistance / Insurance Benefits Check: Does the patient have prescription insurance?  Yes Type of insurance plan: [employer]  Outpatient Pharmacy:  Prior to admission outpatient pharmacy: [facility] Is the patient willing to use [facility] at discharge? Yes Is the patient willing to transition their outpatient pharmacy to utilize a [facility] outpatient pharmacy?   Pending    Assessment: 1. Acute on chronic diastolic CHF (EF [id number]). NYHA class II symptoms. - Bumex 0.5 mg IV x 1 today - Agree with restarting metoprolol XL 200 mg daily - Entresto on hold with AKI. Would resume at discharge - Continue Farxiga 10 mg daily - Continue BiDil 20/37.5 mg 1 tab TID   Plan: 1) Medication changes recommended at this time: - Continue current regimen  2) Patient assistance: - HF TOC appt scheduled for 8/24 - Patient is in coverage gap (donut hole) with insurance. Enrolled  successfully in PAN grant to bring copay for Entresto down to $0 per month - BiDil is now available as generic   3)  Education  - Patient has been educated on current HF medications and potential additions to HF medication regimen - Patient verbalizes understanding that over the next few months, these medication doses may change and more medications may be added to optimize HF regimen - Patient has been educated on basic disease state pathophysiology and goals of therapy  Kerby Nora, PharmD, BCPS Heart Failure Stewardship Pharmacist Phone [phone]

## 2021-04-22 NOTE — Progress Notes (Signed)
Patient ambulating in hall with steady gait, independently. Denies pain, shortness of breath, or feeling lightheaded or dizzy. Patients oxygen saturation with ambulation 90-96%.

## 2021-04-23 LAB — BASIC METABOLIC PANEL
Anion gap: 10 (ref 5–15)
BUN: 27 mg/dL — ABNORMAL HIGH (ref 8–23)
CO2: 32 mmol/L (ref 22–32)
Calcium: 9.3 mg/dL (ref 8.9–10.3)
Chloride: 100 mmol/L (ref 98–111)
Creatinine, Ser: 1.84 mg/dL — ABNORMAL HIGH (ref 0.61–1.24)
GFR, Estimated: 40 mL/min — ABNORMAL LOW (ref >60)
Glucose, Bld: 135 mg/dL — ABNORMAL HIGH (ref 70–99)
Potassium: 3.4 mmol/L — ABNORMAL LOW (ref 3.5–5.1)
Sodium: 142 mmol/L (ref 135–145)

## 2021-04-23 MED ORDER — ISOSORB DINITRATE-HYDRALAZINE 20-37.5 MG PO TABS: 2.0000 | ORAL_TABLET | Freq: Three times a day (TID) | ORAL | 0 refills | Status: DC

## 2021-04-23 MED ORDER — BUMETANIDE 1 MG PO TABS: 1.0000 mg | ORAL_TABLET | Freq: Every day | ORAL | 0 refills | Status: DC

## 2021-04-23 MED ORDER — PANTOPRAZOLE SODIUM 40 MG PO TBEC: 40.0000 mg | DELAYED_RELEASE_TABLET | Freq: Every day | ORAL | 1 refills | Status: DC

## 2021-04-23 MED ORDER — ISOSORB DINITRATE-HYDRALAZINE 20-37.5 MG PO TABS
2.0000 | ORAL_TABLET | Freq: Three times a day (TID) | ORAL | Status: DC
  Administered 2021-04-23: 2 via ORAL
  Filled 2021-04-23: qty 2

## 2021-04-23 MED ORDER — PREDNISONE 10 MG PO TABS: ORAL_TABLET | ORAL | 0 refills | Status: DC

## 2021-04-23 MED ORDER — POTASSIUM CHLORIDE CRYS ER 20 MEQ PO TBCR
40.0000 meq | EXTENDED_RELEASE_TABLET | Freq: Once | ORAL | Status: AC
  Administered 2021-04-23: 40 meq via ORAL
  Filled 2021-04-23: qty 2

## 2021-04-23 MED ORDER — POTASSIUM CHLORIDE CRYS ER 10 MEQ PO TBCR
10.0000 meq | EXTENDED_RELEASE_TABLET | Freq: Every day | ORAL | Status: DC
  Administered 2021-04-23: 10 meq via ORAL
  Filled 2021-04-23: qty 1

## 2021-04-23 MED ORDER — METOPROLOL SUCCINATE ER 200 MG PO TB24: 200.0000 mg | ORAL_TABLET | Freq: Every day | ORAL | 0 refills | Status: DC

## 2021-04-23 NOTE — Care Management Important Message (Signed)
Important Message  Patient Details  Name: [name] MRN: [id number] Date of Birth: 04-16-1956   Medicare Important Message Given:  Yes     [name] 04/23/2021, 12:10 PM

## 2021-04-23 NOTE — Progress Notes (Signed)
Progress Note  Patient Name: [name] Date of Encounter: 04/23/2021  Attending physician: Albertine Patricia, MD Primary care provider: [name], MD  Subjective: [name] is a 65 y.o. male who was seen and examined at bedside at approximately 7:45 AM.  Is sitting on the edge of the bed resting comfortably. Denies chest pain, dyspnea. Patient does continue to have bilateral lower leg edema 1+ pitting.   Ambulated again yesterday without issue.  Objective: Vital Signs in the last 24 hours: Temp:  [98.1 F (36.7 C)-98.3 F (36.8 C)] 98.1 F (36.7 C) (08/18 0430) Pulse Rate:  [62-68] 62 (08/18 0826) Resp:  [18-20] 18 (08/18 0430) BP: (152-153)/(87-92) 153/87 (08/18 0826) SpO2:  [92 %-95 %] 92 % (08/18 0430) Weight:  [103.6 kg] 103.6 kg (08/18 0430)  Intake/Output:  Intake/Output Summary (Last 24 hours) at 04/23/2021 1230 Last data filed at 04/23/2021 1116 Gross per 24 hour  Intake 240 ml  Output 2700 ml  Net -2460 ml    Net IO Since Admission: -7,730 mL [04/23/21 1230]  Weights:  Filed Weights   04/21/21 0225 04/22/21 0331 04/23/21 0430  Weight: 102.8 kg 103.8 kg 103.6 kg    Telemetry: Normal sinus rhythm with occasional PVCs  Physical examination: PHYSICAL EXAM: Vitals with BMI 04/23/2021 04/23/2021 04/23/2021  Height - - -  Weight - - 228 lbs 8 oz  BMI - - [id number]  Systolic 0000000 0000000 0000000  Diastolic 87 87 88  Pulse 62 - 68   Physical Exam Vitals reviewed.  Neck:     Vascular: No JVD.  Cardiovascular:     Rate and Rhythm: Normal rate and regular rhythm.     Pulses: Intact distal pulses.     Heart sounds: S1 normal and S2 normal. No murmur heard.   No gallop.  Pulmonary:     Effort: Pulmonary effort is normal. No respiratory distress.     Breath sounds: No wheezing, rhonchi or rales.  Musculoskeletal:     Right lower leg: Edema (1+ pitting) present.     Left lower leg: Edema (1+ pitting) present.  Skin:    General: Skin is warm and dry.   Neurological:     Mental Status: He is alert.    Lab Results: Hematology Recent Labs  Lab 04/19/21 0240 04/20/21 0217 04/22/21 0149  WBC 5.7 4.6 10.0  RBC 3.40* 3.58* 3.40*  HGB 10.4* 10.6* 10.2*  HCT 31.6* 33.0* 30.8*  MCV 92.9 92.2 90.6  MCH 30.6 29.6 30.0  MCHC 32.9 32.1 33.1  RDW 16.0* 15.5 15.7*  PLT 228 218 221    Chemistry Recent Labs  Lab 04/17/21 1930 04/18/21 0250 04/19/21 0240 04/20/21 0217 04/21/21 0258 04/22/21 0149 04/23/21 0356  NA 137   < > 139   < > 140 138 142  K 3.4*   < > 3.6   < > 3.7 3.6 3.4*  CL 99   < > 100   < > 101 101 100  CO2 31   < > 33*   < > 29 31 32  GLUCOSE 120*   < > 104*   < > 212* 163* 135*  BUN 12   < > 20   < > 23 25* 27*  CREATININE 1.78*   < > 2.03*   < > 2.00* 1.95* 1.84*  CALCIUM 9.0   < > 8.3*   < > 8.7* 8.6* 9.3  PROT 6.6  --  5.6*  --   --   --   --  ALBUMIN 3.2*  --  2.6*  --   --   --   --   AST 27  --  19  --   --   --   --   ALT 25  --  20  --   --   --   --   ALKPHOS 83  --  62  --   --   --   --   BILITOT 0.5  --  0.7  --   --   --   --   GFRNONAA 42*   < > 36*   < > 37* 38* 40*  ANIONGAP 7   < > 6   < > '10 6 10   '$ < > = values in this interval not displayed.     Cardiac Enzymes: Cardiac Panel (last 3 results) No results for input(s): CKTOTAL, CKMB, TROPONINIHS, RELINDX in the last 72 hours.   BNP (last 3 results) Recent Labs    04/09/21 0147 04/17/21 1930 04/22/21 0149  BNP 1,645.7* 2,729.0* 1,205.3*    ProBNP (last 3 results) Recent Labs    03/30/21 0707 04/06/21 0725 04/14/21 0712  PROBNP 2,643* 5,653* 3,473*     DDimer  Recent Labs  Lab 04/17/21 1942  DDIMER 0.82*     Hemoglobin A1c:  Lab Results  Component Value Date   HGBA1C 5.7 (H) 04/08/2021   MPG 116.89 04/08/2021    TSH  Recent Labs    11/19/20 0233  TSH 1.247    Lipid Panel     Component Value Date/Time   CHOL 111 04/09/2021 0147   TRIG 53 04/09/2021 0147   HDL 62 04/09/2021 0147   CHOLHDL 1.8  04/09/2021 0147   VLDL 11 04/09/2021 0147   LDLCALC 38 04/09/2021 0147    Imaging: CT CHEST WO CONTRAST 04/21/2021 1. Widespread patchy nodular thickening of the peribronchovascular interstitium throughout both lungs, most prominent in the upper lobes, with associated patchy peribronchovascular ground-glass opacity. Masslike 3.7 cm focus of consolidation in the anterior right lower lobe, new. 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 Follow-up outpatient high-resolution chest CT suggested in 3-6 months.  2. Mild cardiomegaly. Three-vessel coronary atherosclerosis.  3. Dilated main pulmonary artery, suggesting pulmonary arterial hypertension.  4. Ectatic 4.4 cm ascending thoracic aorta. Recommend annual imaging followup by CTA or MRA. This recommendation follows 2010 ACCF/AHA/AATS/ACR/ASA/SCA/SCAI/SIR/STS/SVM Guidelines for the Diagnosis and Management of Patients with Thoracic Aortic Disease. Circulation. 2010; [id number]. Aortic aneurysm NOS (ICD10-I71.9).  5. Aortic Atherosclerosis (ICD10-I70.0).   Chest x-ray 04/17/2021: Cardiomegaly with vascular congestion, pulmonary edema and possible small right effusion  Cardiac database: EKG 04/17/2021: Sinus rhythm 75 bpm Prolonged QT interval   Echocardiogram 03/23/2021:  1. Left ventricular ejection fraction, by estimation, is 60 to 65%. The  left ventricle has normal function. The left ventricle has no regional  wall motion abnormalities. There is mild left ventricular hypertrophy.  Left ventricular diastolic parameters  are consistent with Grade I diastolic dysfunction (impaired relaxation).   2. Right ventricular systolic function is normal. The right ventricular  size is normal. There is moderately elevated pulmonary artery systolic  pressure. The estimated right ventricular systolic pressure is 0000000 mmHg.   3. Left atrial size was severely dilated.   4. The mitral valve is normal in  structure. Mild mitral valve  regurgitation. No evidence of mitral stenosis.   5. The aortic valve is calcified. Aortic valve regurgitation is mild. No  aortic stenosis is present.  6. The inferior vena cava is dilated in size with <50% respiratory  variability, suggesting right atrial pressure of 15 mmHg.   Scheduled Meds:  aspirin  81 mg Oral Daily   atorvastatin  20 mg Oral Daily   bumetanide (BUMEX) IV  0.5 mg Intravenous Once   bumetanide  1 mg Oral Daily   dapagliflozin propanediol  10 mg Oral Daily   diltiazem  360 mg Oral Daily   enoxaparin (LOVENOX) injection  40 mg Subcutaneous Q24H   guaiFENesin  600 mg Oral BID   isosorbide-hydrALAZINE  2 tablet Oral TID   metoprolol succinate  200 mg Oral Daily   mometasone-formoterol  2 puff Inhalation BID   nicotine  7 mg Transdermal Daily   pantoprazole  40 mg Oral Daily   potassium chloride  10 mEq Oral Daily   predniSONE  40 mg Oral Q breakfast   senna-docusate  1 tablet Oral BID   sodium chloride flush  3 mL Intravenous Q12H   tamsulosin  0.4 mg Oral Daily    Continuous Infusions:  sodium chloride      PRN Meds: sodium chloride, acetaminophen **OR** acetaminophen, ipratropium-albuterol, magnesium hydroxide, menthol-cetylpyridinium, senna-docusate, sodium chloride, sodium chloride flush, traZODone   IMPRESSION & RECOMMENDATIONS: [name] is a 65 y.o. male whose past medical history and cardiac risk factors include: Sarcoidosis, HTN, Tobacco abuse, Hx of PE (was on Xarelto for 1 year), OSA not on CPAP, MSSA Bacteremia (March 2022), Hx of cardiac arrest (2002 during his hospitalization for pancreatitis per wife).  Acute on chronic heart failure with preserved EF, stage C, NYHA class II: Net negative urine output of 6.77 L Increase BiDil to 2 tablets 3 times daily Continue Farxiga and metoprolol Continue Bumex 1 mg p.o. daily. Initiate potassium supplement Patient has diuresed well and renal function remained  stable. Reiterated the importance of dietary compliance, tobacco cessation, and weight loss. Will hold off on reinitiating Entresto at this time. Patient will eventually need left and right heart catheterization, will follow up on this on an outpatient basis.  Acute hypoxic respiratory failure: Likely multifactorial including underlying COPD, OSA (not on CPAP), and acute HFpEF exacerbation. Continue HFpEF management as per above Patient will follow-up outpatient with Dr. Brett Fairy for evaluation/treatment of underlying sleep apnea. Patient's dyspnea has essentially resolved, now at baseline. Patient will eventually need left and right heart catheterization, will follow up on this on an outpatient basis.  Acute kidney injury: Renal function stable with diuresis Will repeat BMP prior to office follow-up. Continue to hold Entresto at this time.  Benign essential hypertension: Blood pressure remains uncontrolled, however it is in acceptable range at this time. Continue diltiazem, metoprolol Increase BiDil from 1 to 2 tablets p.o. 3 times daily   From cardiovascular standpoint patient is stable for discharge, we have arranged outpatient follow-up.  Patient was seen in collaboration with Dr. Einar Gip. He also reviewed patient's chart and examined the patient. Dr. Einar Gip is in agreement of the plan.     Alethia Berthold, PA-C 04/23/2021, 12:30 PM Office: [phone]

## 2021-04-23 NOTE — Progress Notes (Signed)
Physical Therapy Treatment Patient Details Name: [name] MRN: [id number] DOB: [birth year] Today's Date: 04/23/2021    History of Present Illness 65 y.o. male presents to [facility] ED on 04/17/2021 with reports of SOB. Pt recently admitted for CHF exacerbation and discharged 04/09/2021. Pt admitted for management of acute on chronic heart failure and acute hypoxemic respiratory failure. PMH includes diastolic CHF, sarcoidosis, hypertension, hyperlipidemia, history of CVA, CKD stage IIIb.    PT Comments    Pt tolerates treatment well, ambulating independently for community distances. Pt denies SOB at this time. Pt has no further acute PT needs at this time. Acute PT signing off.   Follow Up Recommendations  No PT follow up     Equipment Recommendations  None recommended by PT    Recommendations for Other Services       Precautions / Restrictions Precautions Precautions: None Restrictions Weight Bearing Restrictions: No    Mobility  Bed Mobility                    Transfers Overall transfer level: Independent                  Ambulation/Gait Ambulation/Gait assistance: Independent Gait Distance (Feet): 300 Feet Assistive device: None Gait Pattern/deviations: WFL(Within Functional Limits) Gait velocity: functional Gait velocity interpretation: >2.62 ft/sec, indicative of community ambulatory General Gait Details: steady step-through gait   Stairs             Wheelchair Mobility    Modified Rankin (Stroke Patients Only)       Balance                                            Cognition Arousal/Alertness: Awake/alert Behavior During Therapy: WFL for tasks assessed/performed Overall Cognitive Status: Within Functional Limits for tasks assessed                                        Exercises      General Comments General comments (skin integrity, edema, etc.): VSS on RA, sats from 92-98%, pt denies  SOB      Pertinent Vitals/Pain Pain Assessment: No/denies pain    Home Living                      Prior Function            PT Goals (current goals can now be found in the care plan section) Acute Rehab PT Goals Patient Stated Goal: to go home Progress towards PT goals: Goals met/education completed, patient discharged from PT    Frequency    Min 3X/week      PT Plan Current plan remains appropriate    Co-evaluation              AM-PAC PT "6 Clicks" Mobility   Outcome Measure  Help needed turning from your back to your side while in a flat bed without using bedrails?: None Help needed moving from lying on your back to sitting on the side of a flat bed without using bedrails?: None Help needed moving to and from a bed to a chair (including a wheelchair)?: None Help needed standing up from a chair using your arms (e.g., wheelchair or bedside chair)?: None Help needed to walk  in hospital room?: None Help needed climbing 3-5 steps with a railing? : None 6 Click Score: 24    End of Session   Activity Tolerance: Patient tolerated treatment well Patient left: in bed;with call bell/phone within reach Nurse Communication: Mobility status PT Visit Diagnosis: Other abnormalities of gait and mobility (R26.89)     Time: 1460-4799 PT Time Calculation (min) (ACUTE ONLY): 10 min  Charges:  $Therapeutic Activity: 8-22 mins                     Zenaida Niece, PT, DPT Acute Rehabilitation Pager: [phone]    Zenaida Niece 04/23/2021, 12:46 PM

## 2021-04-23 NOTE — Discharge Instructions (Signed)
Follow with Primary MD [name], MD in 7 days   Get CBC, CMP, checked  by Primary MD next visit.    Activity: As tolerated with Full fall precautions use walker/cane & assistance as needed   Disposition Home   Diet: Heart Healthy  , with feeding assistance and aspiration precautions.  For Heart failure patients - Check your Weight same time everyday, if you gain over 2 pounds, or you develop in leg swelling, experience more shortness of breath or chest pain, call your Primary MD immediately. Follow Cardiac Low Salt Diet and 1.5 lit/day fluid restriction.   On your next visit with your primary care physician please Get Medicines reviewed and adjusted.   Please request your Prim.MD to go over all Hospital Tests and Procedure/Radiological results at the follow up, please get all Hospital records sent to your Prim MD by signing hospital release before you go home.   If you experience worsening of your admission symptoms, develop shortness of breath, life threatening emergency, suicidal or homicidal thoughts you must seek medical attention immediately by calling 911 or calling your MD immediately  if symptoms less severe.  You Must read complete instructions/literature along with all the possible adverse reactions/side effects for all the Medicines you take and that have been prescribed to you. Take any new Medicines after you have completely understood and accpet all the possible adverse reactions/side effects.   Do not drive, operating heavy machinery, perform activities at heights, swimming or participation in water activities or provide baby sitting services if your were admitted for syncope or siezures until you have seen by Primary MD or a Neurologist and advised to do so again.  Do not drive when taking Pain medications.    Do not take more than prescribed Pain, Sleep and Anxiety Medications  Special Instructions: If you have smoked or chewed Tobacco  in the last 2 yrs please  stop smoking, stop any regular Alcohol  and or any Recreational drug use.  Wear Seat belts while driving.   Please note  You were cared for by a hospitalist during your hospital stay. If you have any questions about your discharge medications or the care you received while you were in the hospital after you are discharged, you can call the unit and asked to speak with the hospitalist on call if the hospitalist that took care of you is not available. Once you are discharged, your primary care physician will handle any further medical issues. Please note that NO REFILLS for any discharge medications will be authorized once you are discharged, as it is imperative that you return to your primary care physician (or establish a relationship with a primary care physician if you do not have one) for your aftercare needs so that they can reassess your need for medications and monitor your lab values.

## 2021-04-23 NOTE — Discharge Summary (Signed)
Physician Discharge Summary  ANDRES KAWCZYNSKI [id number] DOB: Dec 24, 1955 DOA: 04/17/2021  PCP: [name], MD  Admit date: 04/17/2021 Discharge date: 04/23/2021  Admitted From: Home Disposition:  Home   Recommendations for Outpatient Follow-up:  Follow up with PCP in 1-2 weeks Please obtain BMP/CBC in one week Patient to follow with pulmonary as an outpatient appointment has been scheduled for 9/7. Ectatic 4.4 cm ascending thoracic aorta.- radiology  Recommend annual imaging followup by CTA or MRA.  Home Health:NO   Discharge Condition:Stable CODE STATUS:FULL Diet recommendation: Heart Healthy   Brief/Interim Summary:  Acute on chronic heart failure with preserved ejection fraction Patient needing BiPAP initially this has improved, no BiPAP requirement over the last 3 days, he was quickly weaned off oxygen, he is tolerating room air even with activity. -Cardiology input greatly appreciated, diuresis per cardiology, diuresis has been on hold over last couple of days given worsening renal function . -He is -7.7 L during hospital stay. -holding Entresto on discharge given elevated potassium worsening renal function -Discharged on increased dose BiDil, as well his labetalol has been changed to metoprolol XL, he will be discharged on Toprol-XL 200 mg oral daily, and his Bumex dose has been lowered to once daily.    Acute hypoxemic respiratory failure:  -Required BiPAP initially, currently improved after IV diuresis and steroids -Mutlifactorial, in the setting of volume overload due to CHF, COPD  exacerbation, and pulmonary sarcoidosis.  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  CT chest significant for widespread patchy nodular thickening, mass 3.7 focus of consolidation anterior right lower lobe, being suspicious for progressive pulmonary sarcoidosis, appears to  be on pulmonary sarcoidosis exacerbation, will need prolonged prednisone, PCCM will arrange for outpatient follow-up. -He will be discharged on prednisone taper   Ectatic 4.4 cm ascending thoracic aorta. - radiology  Recommend annual imaging followup by CTA or MRA.   Essential hypertension: Blood pressure stable cont his Toprol, Cardizem, bidil   as above   Mild Hyperkalemia: Resolved, holding Entresto on discharge   AKI on CKD Stage 3b:  -Creatinine has worsened with diuresis, Entresto has been discontinued at time of discharge.  As well Bumex dose has been lowered on discharge.   Tobacco abuse: Advised cessation-he is actively quitting smoking.  Prolonged QT interval: Monitor , avoid QT prolonging medication.      Discharge Diagnoses:  Principal Problem:   Acute on chronic heart failure with preserved ejection fraction (HFpEF) (HCC) Active Problems:   Sarcoidosis   Essential hypertension   Stage 3b chronic kidney disease   Tobacco abuse   Acute hypoxemic respiratory failure (HCC)   Prolonged QT interval   AKI (acute kidney injury) [facility])    Discharge Instructions  Discharge Instructions     Diet - low sodium heart healthy   Complete by: As directed    Discharge instructions   Complete by: As directed    Follow with Primary MD [name], MD in 7 days   Get CBC, CMP, checked  by Primary MD next visit.    Activity: As tolerated with Full fall precautions use walker/cane & assistance as needed   Disposition Home   Diet: Heart Healthy  , with feeding assistance and aspiration precautions.  For Heart failure patients - Check your Weight same time everyday, if you gain over 2 pounds, or you develop in leg swelling, experience more shortness of  breath or chest pain, call your Primary MD immediately. Follow Cardiac Low Salt Diet and 1.5 lit/day fluid restriction.   On your next visit with your primary care physician please Get Medicines reviewed and  adjusted.   Please request your Prim.MD to go over all Hospital Tests and Procedure/Radiological results at the follow up, please get all Hospital records sent to your Prim MD by signing hospital release before you go home.   If you experience worsening of your admission symptoms, develop shortness of breath, life threatening emergency, suicidal or homicidal thoughts you must seek medical attention immediately by calling 911 or calling your MD immediately  if symptoms less severe.  You Must read complete instructions/literature along with all the possible adverse reactions/side effects for all the Medicines you take and that have been prescribed to you. Take any new Medicines after you have completely understood and accpet all the possible adverse reactions/side effects.   Do not drive, operating heavy machinery, perform activities at heights, swimming or participation in water activities or provide baby sitting services if your were admitted for syncope or siezures until you have seen by Primary MD or a Neurologist and advised to do so again.  Do not drive when taking Pain medications.    Do not take more than prescribed Pain, Sleep and Anxiety Medications  Special Instructions: If you have smoked or chewed Tobacco  in the last 2 yrs please stop smoking, stop any regular Alcohol  and or any Recreational drug use.  Wear Seat belts while driving.   Please note  You were cared for by a hospitalist during your hospital stay. If you have any questions about your discharge medications or the care you received while you were in the hospital after you are discharged, you can call the unit and asked to speak with the hospitalist on call if the hospitalist that took care of you is not available. Once you are discharged, your primary care physician will handle any further medical issues. Please note that NO REFILLS for any discharge medications will be authorized once you are discharged, as it is  imperative that you return to your primary care physician (or establish a relationship with a primary care physician if you do not have one) for your aftercare needs so that they can reassess your need for medications and monitor your lab values.   Increase activity slowly   Complete by: As directed       Allergies as of 04/23/2021       Reactions   Codeine Nausea Only   Hydromorphone Other (See Comments)   hallucinations         Medication List     STOP taking these medications    Entresto 49-51 MG Generic drug: sacubitril-valsartan   labetalol 300 MG tablet Commonly known as: NORMODYNE       TAKE these medications    aspirin 81 MG tablet Take 1 tablet (81 mg total) by mouth daily.   atorvastatin 20 MG tablet Commonly known as: LIPITOR Take 20 mg by mouth daily.   BLACK ELDERBERRY PO Take 2 each by mouth daily as needed (immune support). Gummies   bumetanide 1 MG tablet Commonly known as: BUMEX Take 1 tablet (1 mg total) by mouth daily. What changed: when to take this   dapagliflozin propanediol 10 MG Tabs tablet Commonly known as: FARXIGA Take 1 tablet (10 mg total) by mouth daily.   diltiazem 360 MG 24 hr capsule Commonly known as: CARDIZEM CD Take  360 mg by mouth daily.   fluticasone 50 MCG/ACT nasal spray Commonly known as: FLONASE Place 2 sprays into both nostrils daily as needed for allergies or rhinitis.   isosorbide-hydrALAZINE 20-37.5 MG tablet Commonly known as: BIDIL Take 2 tablets by mouth 3 (three) times daily. What changed: how much to take   metoprolol 200 MG 24 hr tablet Commonly known as: TOPROL-XL Take 1 tablet (200 mg total) by mouth daily. Take with or immediately following a meal. Start taking on: April 24, 2021   pantoprazole 40 MG tablet Commonly known as: Protonix Take 1 tablet (40 mg total) by mouth daily.   predniSONE 10 MG tablet Commonly known as: DELTASONE 40 mg oral daily, decrease by 10 mg every 5 days.    tamsulosin 0.4 MG Caps capsule Commonly known as: FLOMAX Take 0.4 mg by mouth daily.   traZODone 150 MG tablet Commonly known as: DESYREL Take 1 tablet (150 mg total) by mouth at bedtime as needed for sleep.        Follow-up Information     Tolia, Sunit, DO. Go on 04/30/2021.   Specialties: Cardiology, Vascular Surgery Why: Appointment at 11:45 AM 04/30/2021.  Please go to [facility] prior to appointment for blood work to be done.  Bring all medications with you to follow-up appointment. Contact information: [place] [phone]                Allergies  Allergen Reactions   Codeine Nausea Only   Hydromorphone Other (See Comments)    hallucinations     Consultations: Cardiology   Procedures/Studies: DG Chest 2 View  Result Date: 04/07/2021 CLINICAL DATA:  Shortness of breath.  Leg swelling.  Fatigue. EXAM: CHEST - 2 VIEW COMPARISON:  04/01/2021. FINDINGS: Cardiomegaly venous congestion and bilateral interstitial prominence again noted. Right base and right upper lobe alveolar infiltrates. Progressive right pleural effusion. No pneumothorax. Degenerative changes scoliosis thoracic spine. IMPRESSION: 1. Cardiomegaly with pulmonary venous congestion, bilateral interstitial prominence, and progressive right pleural effusion. Findings consistent with CHF. 2. Right base and right upper lobe alveolar infiltrates. This could represent asymmetric pulmonary edema, pneumonia cannot be excluded. Electronically Signed   By: Marcello Moores  Register   On: 04/07/2021 13:31   DG Chest 2 View  Result Date: 04/01/2021 CLINICAL DATA:  Shortness of breath. EXAM: CHEST - 2 VIEW COMPARISON:  March 22, 2021 FINDINGS: Mild, diffusely increased interstitial lung markings are seen. Mild areas of atelectasis and/or infiltrate are also noted within the bilateral lung bases. There are small bilateral pleural effusions. No pneumothorax is identified. The cardiac  silhouette is mildly enlarged and unchanged in size. The visualized skeletal structures are unremarkable. IMPRESSION: Stable cardiomegaly with mild to moderate severity interstitial edema and mild bibasilar atelectasis and/or infiltrate. Electronically Signed   By: Virgina Norfolk M.D.   On: 04/01/2021 23:57   CT CHEST WO CONTRAST  Result Date: 04/21/2021 CLINICAL DATA:  Inpatient. Dyspnea. COPD exacerbation. History of sarcoidosis. EXAM: CT CHEST WITHOUT CONTRAST TECHNIQUE: Multidetector CT imaging of the chest was performed following the standard protocol without IV contrast. COMPARISON:  04/17/2021 chest radiograph. 01/30/2013 chest CT angiogram. FINDINGS: Cardiovascular: Mild cardiomegaly. No significant pericardial effusion/thickening. Three-vessel coronary atherosclerosis. Mildly atherosclerotic thoracic aorta with dilated 4.4 cm ascending thoracic aorta. Dilated main pulmonary artery (4.1 cm diameter). Mediastinum/Nodes: No discrete thyroid nodules. Unremarkable esophagus. No axillary adenopathy. Mildly to moderately enlarged right paratracheal, prevascular, subcarinal and bilateral hilar lymph nodes, several of which are coarsely calcified,  stable to mildly increased since 01/30/2013 chest CT and with many nodes increasingly calcified in the interval. Representative noncalcified enlarged 1.6 cm left prevascular node (series 2/image 63), mildly increased from 1.1 cm. Representative enlarged calcified 1.5 cm right paratracheal node, previously 1.2 cm, mildly increased in size and increased in calcification. Lungs/Pleura: No pneumothorax. No pleural effusion. Masslike 3.7 x 2.4 cm focus of consolidation in the anterior right lower lobe (series 3/image 105), new. Widespread patchy nodular thickening of the peribronchovascular interstitium throughout both lungs, most prominent in the upper lobes, with associated patchy peribronchovascular ground-glass opacity, generally worsened in the interval.  Representative indistinct peribronchovascular 0.7 cm posterior right upper lobe nodule (series 3/image 56), new. Nodular and bandlike peribronchovascular consolidation in the left lower lobe (series 3/image 114), new. No significant regions of traction bronchiectasis. Upper abdomen: Cholecystectomy. Musculoskeletal: No aggressive appearing focal osseous lesions. Moderate thoracic spondylosis. IMPRESSION: 1. Widespread patchy nodular thickening of the peribronchovascular interstitium throughout both lungs, most prominent in the upper lobes, with associated patchy peribronchovascular ground-glass opacity. Masslike 3.7 cm focus of consolidation in the anterior right lower lobe, new. 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 Follow-up outpatient high-resolution chest CT suggested in 3-6 months. 2. Mild cardiomegaly. Three-vessel coronary atherosclerosis. 3. Dilated main pulmonary artery, suggesting pulmonary arterial hypertension. 4. Ectatic 4.4 cm ascending thoracic aorta. Recommend annual imaging followup by CTA or MRA. This recommendation follows 2010 ACCF/AHA/AATS/ACR/ASA/SCA/SCAI/SIR/STS/SVM Guidelines for the Diagnosis and Management of Patients with Thoracic Aortic Disease. Circulation. 2010; [id number]. Aortic aneurysm NOS (ICD10-I71.9). 5. Aortic Atherosclerosis (ICD10-I70.0). Electronically Signed   By: Ilona Sorrel M.D.   On: 04/21/2021 15:48   DG Chest Portable 1 View  Result Date: 04/17/2021 CLINICAL DATA:  Shortness of breath EXAM: PORTABLE CHEST 1 VIEW COMPARISON:  04/07/2021 FINDINGS: Cardiomegaly with vascular congestion and pulmonary edema. Probable small right-sided pleural effusion. No pneumothorax. IMPRESSION: Cardiomegaly with vascular congestion, pulmonary edema and possible small right effusion Electronically Signed   By: Donavan Foil M.D.   On: 04/17/2021 21:18      Subjective:  Patient reports she is feeling better today, no  dyspnea, no chest pain, he is eager to go home. Discharge Exam: Vitals:   04/23/21 0824 04/23/21 0826  BP: (!) 153/87 (!) 153/87  Pulse:  62  Resp:    Temp:    SpO2:     Vitals:   04/22/21 2053 04/23/21 0430 04/23/21 0824 04/23/21 0826  BP: (!) 152/92 (!) 152/88 (!) 153/87 (!) 153/87  Pulse: 66 68  62  Resp: 20 18    Temp: 98.3 F (36.8 C) 98.1 F (36.7 C)    TempSrc: Oral Oral    SpO2: 95% 92%    Weight:  103.6 kg    Height:        General: Pt is alert, awake, not in acute distress Cardiovascular: RRR, S1/S2 +, no rubs, no gallops Respiratory: CTA bilaterally, no wheezing, no rhonchi Abdominal: Soft, NT, ND, bowel sounds + Extremities: no edema, no cyanosis    The results of significant diagnostics from this hospitalization (including imaging, microbiology, ancillary and laboratory) are listed below for reference.     Microbiology: Recent Results (from the past 240 hour(s))  Resp Panel by RT-PCR (Flu A&B, Covid) Nasopharyngeal Swab     Status: None   Collection Time: 04/17/21  7:45 PM   Specimen: Nasopharyngeal Swab; Nasopharyngeal(NP) swabs in vial transport medium  Result Value Ref Range Status   SARS Coronavirus 2 by RT PCR NEGATIVE  NEGATIVE Final    Comment: (NOTE) SARS-CoV-2 target nucleic acids are NOT DETECTED.  The SARS-CoV-2 RNA is generally detectable in upper respiratory specimens during the acute phase of infection. The lowest concentration of SARS-CoV-2 viral copies this assay can detect is 138 copies/mL. A negative result does not preclude SARS-Cov-2 infection and should not be used as the sole basis for treatment or other patient management decisions. A negative result may occur with  improper specimen collection/handling, submission of specimen other than nasopharyngeal swab, presence of viral mutation(s) within the areas targeted by this assay, and inadequate number of viral copies(<138 copies/mL). A negative result must be combined  with clinical observations, patient history, and epidemiological information. The expected result is Negative.  Fact Sheet for Patients:  EntrepreneurPulse.com.au  Fact Sheet for Healthcare Providers:  IncredibleEmployment.be  This test is no t yet approved or cleared by the [place] FDA and  has been authorized for detection and/or diagnosis of SARS-CoV-2 by FDA under an Emergency Use Authorization (EUA). This EUA will remain  in effect (meaning this test can be used) for the duration of the COVID-19 declaration under Section 564(b)(1) of the Act, 21 U.S.C.section 360bbb-3(b)(1), unless the authorization is terminated  or revoked sooner.       Influenza A by PCR NEGATIVE NEGATIVE Final   Influenza B by PCR NEGATIVE NEGATIVE Final    Comment: (NOTE) The Xpert Xpress SARS-CoV-2/FLU/RSV plus assay is intended as an aid in the diagnosis of influenza from Nasopharyngeal swab specimens and should not be used as a sole basis for treatment. Nasal washings and aspirates are unacceptable for Xpert Xpress SARS-CoV-2/FLU/RSV testing.  Fact Sheet for Patients: EntrepreneurPulse.com.au  Fact Sheet for Healthcare Providers: IncredibleEmployment.be  This test is not yet approved or cleared by the [place] FDA and has been authorized for detection and/or diagnosis of SARS-CoV-2 by FDA under an Emergency Use Authorization (EUA). This EUA will remain in effect (meaning this test can be used) for the duration of the COVID-19 declaration under Section 564(b)(1) of the Act, 21 U.S.C. section 360bbb-3(b)(1), unless the authorization is terminated or revoked.  Performed at [facility], [place] [address], [place], [place] [address]      Labs: BNP (last 3 results) Recent Labs    04/09/21 0147 04/17/21 1930 04/22/21 0149  BNP 1,645.7* 2,729.0* [id number]   Basic Metabolic Panel: Recent Labs  Lab  04/19/21 0240 04/20/21 0217 04/21/21 0258 04/22/21 0149 04/23/21 0356  NA 139 140 140 138 142  K 3.6 3.9 3.7 3.6 3.4*  CL 100 102 101 101 100  CO2 33* '30 29 31 '$ 32  GLUCOSE 104* 174* 212* 163* 135*  BUN '20 23 23 '$ 25* 27*  CREATININE 2.03* 1.93* 2.00* 1.95* 1.84*  CALCIUM 8.3* 8.5* 8.7* 8.6* 9.3   Liver Function Tests: Recent Labs  Lab 04/17/21 1930 04/19/21 0240  AST 27 19  ALT 25 20  ALKPHOS 83 62  BILITOT 0.5 0.7  PROT 6.6 5.6*  ALBUMIN 3.2* 2.6*   No results for input(s): LIPASE, AMYLASE in the last 168 hours. No results for input(s): AMMONIA in the last 168 hours. CBC: Recent Labs  Lab 04/17/21 1930 04/18/21 0250 04/19/21 0240 04/20/21 0217 04/22/21 0149  WBC 6.8 7.7 5.7 4.6 10.0  NEUTROABS 5.3  --   --   --   --   HGB 12.4* 11.4* 10.4* 10.6* 10.2*  HCT 38.6* 35.7* 31.6* 33.0* 30.8*  MCV 92.8 93.0 92.9 92.2 90.6  PLT 246 231 228  218 221   Cardiac Enzymes: No results for input(s): CKTOTAL, CKMB, CKMBINDEX, TROPONINI in the last 168 hours. BNP: Invalid input(s): POCBNP CBG: No results for input(s): GLUCAP in the last 168 hours. D-Dimer No results for input(s): DDIMER in the last 72 hours. Hgb A1c No results for input(s): HGBA1C in the last 72 hours. Lipid Profile No results for input(s): CHOL, HDL, LDLCALC, TRIG, CHOLHDL, LDLDIRECT in the last 72 hours. Thyroid function studies No results for input(s): TSH, T4TOTAL, T3FREE, THYROIDAB in the last 72 hours.  Invalid input(s): FREET3 Anemia work up No results for input(s): VITAMINB12, FOLATE, FERRITIN, TIBC, IRON, RETICCTPCT in the last 72 hours. Urinalysis    Component Value Date/Time   COLORURINE YELLOW 04/01/2021 2255   APPEARANCEUR CLEAR 04/01/2021 2255   LABSPEC 1.013 04/01/2021 2255   PHURINE 6.0 04/01/2021 2255   GLUCOSEU 150 (A) 04/01/2021 2255   HGBUR SMALL (A) 04/01/2021 2255   BILIRUBINUR NEGATIVE 04/01/2021 2255   KETONESUR NEGATIVE 04/01/2021 2255   PROTEINUR >=300 (A) 04/01/2021 2255    UROBILINOGEN 0.2 11/16/2012 1004   NITRITE NEGATIVE 04/01/2021 2255   LEUKOCYTESUR NEGATIVE 04/01/2021 2255   Sepsis Labs Invalid input(s): PROCALCITONIN,  WBC,  LACTICIDVEN Microbiology Recent Results (from the past 240 hour(s))  Resp Panel by RT-PCR (Flu A&B, Covid) Nasopharyngeal Swab     Status: None   Collection Time: 04/17/21  7:45 PM   Specimen: Nasopharyngeal Swab; Nasopharyngeal(NP) swabs in vial transport medium  Result Value Ref Range Status   SARS Coronavirus 2 by RT PCR NEGATIVE NEGATIVE Final    Comment: (NOTE) SARS-CoV-2 target nucleic acids are NOT DETECTED.  The SARS-CoV-2 RNA is generally detectable in upper respiratory specimens during the acute phase of infection. The lowest concentration of SARS-CoV-2 viral copies this assay can detect is 138 copies/mL. A negative result does not preclude SARS-Cov-2 infection and should not be used as the sole basis for treatment or other patient management decisions. A negative result may occur with  improper specimen collection/handling, submission of specimen other than nasopharyngeal swab, presence of viral mutation(s) within the areas targeted by this assay, and inadequate number of viral copies(<138 copies/mL). A negative result must be combined with clinical observations, patient history, and epidemiological information. The expected result is Negative.  Fact Sheet for Patients:  EntrepreneurPulse.com.au  Fact Sheet for Healthcare Providers:  IncredibleEmployment.be  This test is no t yet approved or cleared by the [place] FDA and  has been authorized for detection and/or diagnosis of SARS-CoV-2 by FDA under an Emergency Use Authorization (EUA). This EUA will remain  in effect (meaning this test can be used) for the duration of the COVID-19 declaration under Section 564(b)(1) of the Act, 21 U.S.C.section 360bbb-3(b)(1), unless the authorization is terminated  or revoked  sooner.       Influenza A by PCR NEGATIVE NEGATIVE Final   Influenza B by PCR NEGATIVE NEGATIVE Final    Comment: (NOTE) The Xpert Xpress SARS-CoV-2/FLU/RSV plus assay is intended as an aid in the diagnosis of influenza from Nasopharyngeal swab specimens and should not be used as a sole basis for treatment. Nasal washings and aspirates are unacceptable for Xpert Xpress SARS-CoV-2/FLU/RSV testing.  Fact Sheet for Patients: EntrepreneurPulse.com.au  Fact Sheet for Healthcare Providers: IncredibleEmployment.be  This test is not yet approved or cleared by the [place] FDA and has been authorized for detection and/or diagnosis of SARS-CoV-2 by FDA under an Emergency Use Authorization (EUA). This EUA will remain in effect (meaning this test can be  used) for the duration of the COVID-19 declaration under Section 564(b)(1) of the Act, 21 U.S.C. section 360bbb-3(b)(1), unless the authorization is terminated or revoked.  Performed at [facility], [place] [address] [place], [place] [address]      Time coordinating discharge: Over 30 minutes  SIGNED:   Phillips Climes, MD  [facility] 04/23/2021, 4:42 PM Pager   If 7PM-7AM, please contact night-coverage [email or web address] Password [email or web address]

## 2021-04-23 NOTE — Progress Notes (Signed)
Heart Failure Stewardship Pharmacist Progress Note   PCP: [name], MD PCP-Cardiologist: None    HPI:  65 yo M with PMH of CHF, sarcoidosis, HTN, HLD, CVA, and CKD IIIb. He presented to the ED on 8/12 with shortness of breath, cough, and LE edema.  Recent hospitalization from 8/2-8/4 for acute on chronic diastolic heart failure. His last ECHO was done on 03/23/21 and LVEF was 60-65% (relatively stable from prior [facility]). Concern for dietary indiscretion vs medication noncompliance. CXR on admission significant for CHF.   Current HF Medications: Bumetanide 1 mg PO daily Metoprolol XL 200 mg daily Farxiga 10 mg daily BiDil 20/37.5 mg 2 tabs TID  Prior to admission HF Medications: Bumetanide 1 mg BID Entresto 49/51 mg BID BiDil 20/37.5 mg 1 tab TID Farxiga 10 mg daily  Pertinent Lab Values: Serum creatinine 1.84 (baseline ~1.6-1.8), BUN 27, Potassium 3.4, Sodium 142, BNP 2729.0  Vital Signs: Weight: 228 lbs (admission weight: 224 lbs) Blood pressure: 150/80s  Heart rate: 60s   Medication Assistance / Insurance Benefits Check: Does the patient have prescription insurance?  Yes Type of insurance plan: [employer]  Outpatient Pharmacy:  Prior to admission [facility] Is the patient willing to use [facility] at discharge? Yes Is the patient willing to transition their outpatient pharmacy to utilize a [facility] outpatient pharmacy?   Pending    Assessment: 1. Acute on chronic diastolic CHF (EF 123456). NYHA class II symptoms. - Agree with starting Bumex 1 mg PO daily - Continue metoprolol XL 200 mg daily - Entresto on hold with AKI. Consider resuming at discharge - Continue Farxiga 10 mg daily - Agree with increasing to BiDil 20/37.5 mg 2 tabs TID   Plan: 1) Medication changes recommended at this time: - Agree with changes as above  2) Patient assistance: - HF TOC appt scheduled for 8/24 - Patient is in coverage gap (donut hole)  with insurance. Enrolled successfully in [employer] grant to bring copay for Entresto down to $0 per month - BiDil is now available as generic   3)  Education  - Patient has been educated on current HF medications and potential additions to HF medication regimen - Patient verbalizes understanding that over the next few months, these medication doses may change and more medications may be added to optimize HF regimen - Patient has been educated on basic disease state pathophysiology and goals of therapy  Kerby Nora, PharmD, BCPS Heart Failure Stewardship Pharmacist Phone [phone]

## 2021-04-24 ENCOUNTER — Telehealth: Payer: Self-pay

## 2021-04-24 NOTE — Telephone Encounter (Signed)
Patient was approved by [employer] foundation for [employer]

## 2021-04-24 NOTE — Telephone Encounter (Signed)
Please order BMP,Ntprobnp and mg two days before visit Please remind the patient to bring the caridac medication bottles at the next office visit to help facilitate a more accurate medication reconciliation.

## 2021-04-27 DIAGNOSIS — I5031 Acute diastolic (congestive) heart failure: Secondary | ICD-10-CM | POA: Diagnosis not present

## 2021-04-28 ENCOUNTER — Telehealth (HOSPITAL_COMMUNITY): Payer: Self-pay

## 2021-04-28 ENCOUNTER — Other Ambulatory Visit: Payer: Self-pay

## 2021-04-28 DIAGNOSIS — I5031 Acute diastolic (congestive) heart failure: Secondary | ICD-10-CM

## 2021-04-28 DIAGNOSIS — I10 Essential (primary) hypertension: Secondary | ICD-10-CM

## 2021-04-28 LAB — BASIC METABOLIC PANEL
BUN/Creatinine Ratio: 15 (ref 10–24)
BUN: 26 mg/dL (ref 8–27)
CO2: 28 mmol/L (ref 20–29)
Calcium: 9.3 mg/dL (ref 8.6–10.2)
Chloride: 107 mmol/L — ABNORMAL HIGH (ref 96–106)
Creatinine, Ser: 1.7 mg/dL — ABNORMAL HIGH (ref 0.76–1.27)
Glucose: 123 mg/dL — ABNORMAL HIGH (ref 65–99)
Potassium: 3.9 mmol/L (ref 3.5–5.2)
Sodium: 147 mmol/L — ABNORMAL HIGH (ref 134–144)
eGFR: 44 mL/min/{1.73_m2} — ABNORMAL LOW (ref >59)

## 2021-04-28 LAB — MAGNESIUM: Magnesium: 2.4 mg/dL — ABNORMAL HIGH (ref 1.6–2.3)

## 2021-04-28 LAB — BRAIN NATRIURETIC PEPTIDE: BNP: 1102.7 pg/mL — ABNORMAL HIGH (ref 0.0–100.0)

## 2021-04-28 NOTE — Telephone Encounter (Signed)
Called to confirm Heart & Vascular Transitions of Care appointment at [facility] 8/24. Patient reminded to bring all medications and pill box organizer with them. Confirmed patient has transportation. Gave directions, instructed to utilize [place] parking.  Confirmed appointment prior to ending call.   Pricilla Holm, MSN, RN Heart Failure Nurse Navigator [phone]

## 2021-04-28 NOTE — Progress Notes (Signed)
Just heads up. He is seeing [facility] tomorrow and you on Thursday. BNP pretty stable from d/c (1205).

## 2021-04-28 NOTE — Progress Notes (Signed)
Heart [facility] Vascular Center Transitions of Care Clinic  PCP: [name] Primary Cardiologist: Rex Kras  HPI:  [name] is a 65 y.o.  male  with a PMH significant for  HFpEF, sarcoidosis, HTN, HLD,  CKD 3B, tobacco use disorder, OSA not on CPAP, hx of PE   History of Sarcoidosis says he was diagnosed with pulmonary sarcoidosis about 25 years ago based on chest x-ray.  Started on prednisone and under surveillance used to see Baird Lyons for 2-3 years it was considered dormant and he was released.  Followed by Dr. Terri Skains for HFpEF.  Has had difficulty with medication adherence.  Multiple admissions this year 2/2 respiratory failure.  Last echo 03/23/2021 EF 60 to 65%, G1 DD 8, RVSP estimate 49.2 mmHg, severely dilated left atrium, mild MR dilated IVC with less than 50% respiratory variability.  Also developed MSSA bacteremia March 2020.   Last admission 04/17/2021 initially thought to be primarily acute on chronic HFpEF.  BNP 2729, normal high-sensitivity troponin x2, chest x-ray with cardiomegaly, vascular congestion, pulmonary edema.  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 Pulmonology was consulted.  Patient was started on prednisone taper and pulmonology follow-up was arranged.  Weight trend 224 pounds >228 pounds.    Has been feeling better since hospitalization.  Having trouble with his bp at home since discharge.  Trending high about 160/95 at home lately.  Denies dyspnea at rest.  Still having trouble climbing stairs after about 7-8 steps.  He attributes it at least partly to having leg weakness.  Weighing himself at home.  By home scale has been about 219 and relatively stable although this morning weight up 2lbs to 221.    ROS: All systems negative except as listed in HPI, PMH and Problem List.  SH:  Social History    Socioeconomic History   Marital status: Married    Spouse name: Building services engineer   Number of children: 2   Years of education: Not on file   Highest education level: Associate degree: academic program  Occupational History   Not on file  Tobacco Use   Smoking status: Former    Packs/day: 0.25    Years: 15.00    Pack years: 3.75    Types: Cigarettes    Start date: 09/06/2005    Quit date: 02/23/2021    Years since quitting: 0.1   Smokeless tobacco: Never  Vaping Use   Vaping Use: Never used  Substance and Sexual Activity   Alcohol use: No    Alcohol/week: 0.0 standard drinks   Drug use: No   Sexual activity: Yes  Other Topics Concern   Not on file  Social History Narrative   Not on file   Social Determinants of Health   Financial Resource Strain: Low Risk    Difficulty of Paying Living Expenses: Not very hard  Food Insecurity: Not on file  Transportation Needs: No Transportation Needs   Lack of Transportation (Medical): No   Lack of Transportation (Non-Medical): No  Physical Activity: Not on file  Stress: Not on file  Social Connections: Not on file  Intimate Partner Violence: Not on file    FH:  Family History  Problem Relation Age of Onset   Hypertension Father    CVA Father    Lung cancer Father    Alzheimer's disease Mother    Hypertension Sister  Heart failure Sister     Past Medical History:  Diagnosis Date   Back pain    CKD (chronic kidney disease), stage III (HCC)    CVA (cerebral infarction)    Diastolic dysfunction    ED (erectile dysfunction)    GERD (gastroesophageal reflux disease)    Hypercalcemia    Hyperlipidemia    Hypertension    Insomnia    Long-term use of high-risk medication    Nephrocalcinosis    Nephrolithiasis    Obesity    Osteopenia    Pancreatitis    PE (pulmonary embolism)    Proteinuria    Sarcoidosis    Smoker     Current Outpatient Medications  Medication Sig Dispense Refill   aspirin 81 MG tablet Take 1  tablet (81 mg total) by mouth daily.     atorvastatin (LIPITOR) 20 MG tablet Take 20 mg by mouth daily.     BLACK ELDERBERRY PO Take 2 each by mouth daily as needed (immune support). Gummies     bumetanide (BUMEX) 1 MG tablet Take 1 tablet (1 mg total) by mouth daily. 30 tablet 0   dapagliflozin propanediol (FARXIGA) 10 MG TABS tablet Take 1 tablet (10 mg total) by mouth daily. 30 tablet 0   diltiazem (CARDIZEM CD) 360 MG 24 hr capsule Take 360 mg by mouth daily.     fluticasone (FLONASE) 50 MCG/ACT nasal spray Place 2 sprays into both nostrils daily as needed for allergies or rhinitis.     isosorbide-hydrALAZINE (BIDIL) 20-37.5 MG tablet Take 2 tablets by mouth 3 (three) times daily. 180 tablet 0   metoprolol succinate (TOPROL-XL) 200 MG 24 hr tablet Take 1 tablet (200 mg total) by mouth daily. Take with or immediately following a meal. 30 tablet 0   pantoprazole (PROTONIX) 40 MG tablet Take 1 tablet (40 mg total) by mouth daily. 30 tablet 1   predniSONE (DELTASONE) 10 MG tablet 40 mg oral daily, decrease by 10 mg every 5 days. 45 tablet 0   tamsulosin (FLOMAX) 0.4 MG CAPS capsule Take 0.4 mg by mouth daily.     traZODone (DESYREL) 150 MG tablet Take 1 tablet (150 mg total) by mouth at bedtime as needed for sleep. 90 tablet 3   No current facility-administered medications for this encounter.    Vitals:   04/29/21 1057  BP: (!) 180/100  Pulse: 74  SpO2: 92%  Weight: 102.7 kg (226 lb 6.4 oz)    PHYSICAL EXAM: Cardiac: JVD 10cm , normal rate and rhythm, clear s1 and s2, no murmurs, rubs or gallops, 1+ LE edema Pulmonary: CTAB, not in distress Abdominal: non distended abdomen, soft and nontender Psych: Alert, conversant, in good spirits   ASSESSMENT & PLAN: HFpEF -Last echo 03/23/2021 EF 60 to 65%, G1 DD 8, RVSP estimate 49.2 mmHg, severely dilated left atrium, mild MR dilated IVC with less than 50% respiratory variability. 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 Valeta Harms next week  HTN   -remains hypertensive likely worsened by steroids and recent medication changes during hospitalization  -hydral/nitrate increase as outlined above  CKD 3B -renal fx improving bmp 2 days ago cr down to 1.7 with normal BUN  Tobacco use disorder  -Encouraged continued cessation  OSA not on CPAP -has repeat sleep study tonight  Dilated Ascending Aorta -4.4cm by CT, will need annual CTA/MRA studies going forward   Follow up with Dr. Terri Skains tomorrow

## 2021-04-29 ENCOUNTER — Ambulatory Visit (INDEPENDENT_AMBULATORY_CARE_PROVIDER_SITE_OTHER): Payer: PPO | Admitting: Neurology

## 2021-04-29 ENCOUNTER — Ambulatory Visit (HOSPITAL_COMMUNITY)
Admit: 2021-04-29 | Discharge: 2021-04-29 | Disposition: A | Discharge status: Home / Self Care | Payer: PPO | Attending: Internal Medicine | Admitting: Internal Medicine

## 2021-04-29 ENCOUNTER — Other Ambulatory Visit (HOSPITAL_COMMUNITY): Payer: Self-pay

## 2021-04-29 ENCOUNTER — Encounter (HOSPITAL_COMMUNITY): Payer: Self-pay

## 2021-04-29 ENCOUNTER — Other Ambulatory Visit: Payer: Self-pay

## 2021-04-29 VITALS — BP 180/100 | HR 74 | Wt 226.4 lb

## 2021-04-29 DIAGNOSIS — Z87891 Personal history of nicotine dependence: Secondary | ICD-10-CM | POA: Insufficient documentation

## 2021-04-29 DIAGNOSIS — I5032 Chronic diastolic (congestive) heart failure: Secondary | ICD-10-CM | POA: Diagnosis not present

## 2021-04-29 DIAGNOSIS — I77810 Thoracic aortic ectasia: Secondary | ICD-10-CM | POA: Diagnosis not present

## 2021-04-29 DIAGNOSIS — I340 Nonrheumatic mitral (valve) insufficiency: Secondary | ICD-10-CM | POA: Diagnosis not present

## 2021-04-29 DIAGNOSIS — I5033 Acute on chronic diastolic (congestive) heart failure: Secondary | ICD-10-CM | POA: Diagnosis not present

## 2021-04-29 DIAGNOSIS — Z8673 Personal history of transient ischemic attack (TIA), and cerebral infarction without residual deficits: Secondary | ICD-10-CM | POA: Diagnosis not present

## 2021-04-29 DIAGNOSIS — Z7952 Long term (current) use of systemic steroids: Secondary | ICD-10-CM | POA: Insufficient documentation

## 2021-04-29 DIAGNOSIS — E785 Hyperlipidemia, unspecified: Secondary | ICD-10-CM | POA: Insufficient documentation

## 2021-04-29 DIAGNOSIS — G4733 Obstructive sleep apnea (adult) (pediatric): Secondary | ICD-10-CM

## 2021-04-29 DIAGNOSIS — G3184 Mild cognitive impairment, so stated: Secondary | ICD-10-CM

## 2021-04-29 DIAGNOSIS — D869 Sarcoidosis, unspecified: Secondary | ICD-10-CM

## 2021-04-29 DIAGNOSIS — F17200 Nicotine dependence, unspecified, uncomplicated: Secondary | ICD-10-CM | POA: Diagnosis not present

## 2021-04-29 DIAGNOSIS — Z8249 Family history of ischemic heart disease and other diseases of the circulatory system: Secondary | ICD-10-CM | POA: Insufficient documentation

## 2021-04-29 DIAGNOSIS — Z86711 Personal history of pulmonary embolism: Secondary | ICD-10-CM | POA: Insufficient documentation

## 2021-04-29 DIAGNOSIS — Z7984 Long term (current) use of oral hypoglycemic drugs: Secondary | ICD-10-CM | POA: Diagnosis not present

## 2021-04-29 DIAGNOSIS — N1832 Chronic kidney disease, stage 3b: Secondary | ICD-10-CM | POA: Insufficient documentation

## 2021-04-29 DIAGNOSIS — I130 Hypertensive heart and chronic kidney disease with heart failure and stage 1 through stage 4 chronic kidney disease, or unspecified chronic kidney disease: Secondary | ICD-10-CM | POA: Diagnosis not present

## 2021-04-29 DIAGNOSIS — Z7982 Long term (current) use of aspirin: Secondary | ICD-10-CM | POA: Insufficient documentation

## 2021-04-29 DIAGNOSIS — G9340 Encephalopathy, unspecified: Secondary | ICD-10-CM

## 2021-04-29 DIAGNOSIS — Z79899 Other long term (current) drug therapy: Secondary | ICD-10-CM | POA: Insufficient documentation

## 2021-04-29 DIAGNOSIS — I6782 Cerebral ischemia: Secondary | ICD-10-CM

## 2021-04-29 MED ORDER — ISOSORBIDE MONONITRATE ER 30 MG PO TB24
90.0000 mg | ORAL_TABLET | Freq: Every day | ORAL | 6 refills | Status: DC
  Filled 2021-04-29 (×2): qty 90, 30d supply, fill #0
  Filled 2021-05-29: qty 90, 30d supply, fill #1
  Filled 2021-06-28: qty 90, 30d supply, fill #2
  Filled 2021-08-01: qty 90, 30d supply, fill #3
  Filled 2021-08-30: qty 90, 30d supply, fill #4
  Filled 2021-09-22: qty 90, 30d supply, fill #5
  Filled 2021-10-25: qty 90, 30d supply, fill #6

## 2021-04-29 MED ORDER — HYDRALAZINE HCL 100 MG PO TABS
100.0000 mg | ORAL_TABLET | Freq: Three times a day (TID) | ORAL | 3 refills | Status: DC
  Filled 2021-04-29: qty 12, 5d supply, fill #0
  Filled 2021-04-29: qty 90, 30d supply, fill #0
  Filled 2021-04-29: qty 78, 25d supply, fill #0
  Filled 2021-05-29: qty 90, 30d supply, fill #1
  Filled 2021-06-28: qty 90, 30d supply, fill #2
  Filled 2021-08-15: qty 90, 30d supply, fill #3

## 2021-04-29 NOTE — Progress Notes (Signed)
Heart and Vascular Center Transitions of [place] Heart Failure Pharmacist Encounter  PCP: [name], MD PCP-Cardiologist: None  Discussed with patient and his wife about HF medication assistance. Entresto assistance was obtained through [employer] foundation during hospitalization but this has since been discontinued. He is scheduled for cardiology follow up tomorrow. Informed him that if Delene Loll is not resumed, then we will contact [employer] foundation and have them transfer grant information from [place] to [place].  BiDil is being changed to hydralazine/Imdur today in clinic to allow for uptitration of dose and to help with cost.  All questions and concerns addressed during visit.   Kerby Nora, PharmD, BCPS Heart Failure Transitions of Care Clinic Pharmacist [phone]

## 2021-04-29 NOTE — Patient Instructions (Signed)
Good to see you! Stop taking BiDil Begin taking Hydralazine 100 mg three times daily Begin taking Imdur 90 mg daily Take extra dose of Bumex today

## 2021-04-30 ENCOUNTER — Encounter: Payer: Self-pay | Admitting: Cardiology

## 2021-04-30 ENCOUNTER — Other Ambulatory Visit (HOSPITAL_COMMUNITY): Payer: Self-pay

## 2021-04-30 ENCOUNTER — Ambulatory Visit: Payer: PPO | Admitting: Cardiology

## 2021-04-30 VITALS — BP 152/96 | HR 68 | Temp 97.9°F | Resp 16 | Ht 72.0 in | Wt 229.0 lb

## 2021-04-30 DIAGNOSIS — I5031 Acute diastolic (congestive) heart failure: Secondary | ICD-10-CM

## 2021-04-30 DIAGNOSIS — I5032 Chronic diastolic (congestive) heart failure: Secondary | ICD-10-CM | POA: Diagnosis not present

## 2021-04-30 DIAGNOSIS — I4729 Other ventricular tachycardia: Secondary | ICD-10-CM

## 2021-04-30 DIAGNOSIS — G4733 Obstructive sleep apnea (adult) (pediatric): Secondary | ICD-10-CM | POA: Diagnosis not present

## 2021-04-30 DIAGNOSIS — Z86711 Personal history of pulmonary embolism: Secondary | ICD-10-CM | POA: Diagnosis not present

## 2021-04-30 DIAGNOSIS — I712 Thoracic aortic aneurysm, without rupture: Secondary | ICD-10-CM | POA: Diagnosis not present

## 2021-04-30 DIAGNOSIS — R0600 Dyspnea, unspecified: Secondary | ICD-10-CM

## 2021-04-30 DIAGNOSIS — E782 Mixed hyperlipidemia: Secondary | ICD-10-CM

## 2021-04-30 DIAGNOSIS — N1831 Chronic kidney disease, stage 3a: Secondary | ICD-10-CM | POA: Diagnosis not present

## 2021-04-30 DIAGNOSIS — Z720 Tobacco use: Secondary | ICD-10-CM

## 2021-04-30 DIAGNOSIS — D860 Sarcoidosis of lung: Secondary | ICD-10-CM | POA: Diagnosis not present

## 2021-04-30 DIAGNOSIS — I10 Essential (primary) hypertension: Secondary | ICD-10-CM

## 2021-04-30 DIAGNOSIS — F17210 Nicotine dependence, cigarettes, uncomplicated: Secondary | ICD-10-CM | POA: Diagnosis not present

## 2021-04-30 DIAGNOSIS — R911 Solitary pulmonary nodule: Secondary | ICD-10-CM | POA: Diagnosis not present

## 2021-04-30 DIAGNOSIS — I472 Ventricular tachycardia: Secondary | ICD-10-CM

## 2021-04-30 DIAGNOSIS — I5189 Other ill-defined heart diseases: Secondary | ICD-10-CM | POA: Diagnosis not present

## 2021-04-30 DIAGNOSIS — R0609 Other forms of dyspnea: Secondary | ICD-10-CM | POA: Diagnosis not present

## 2021-04-30 DIAGNOSIS — R809 Proteinuria, unspecified: Secondary | ICD-10-CM | POA: Diagnosis not present

## 2021-04-30 DIAGNOSIS — I129 Hypertensive chronic kidney disease with stage 1 through stage 4 chronic kidney disease, or unspecified chronic kidney disease: Secondary | ICD-10-CM | POA: Diagnosis not present

## 2021-04-30 DIAGNOSIS — E785 Hyperlipidemia, unspecified: Secondary | ICD-10-CM | POA: Diagnosis not present

## 2021-04-30 DIAGNOSIS — I700 Atherosclerosis of aorta: Secondary | ICD-10-CM | POA: Diagnosis not present

## 2021-04-30 DIAGNOSIS — R0602 Shortness of breath: Secondary | ICD-10-CM | POA: Diagnosis not present

## 2021-04-30 DIAGNOSIS — J449 Chronic obstructive pulmonary disease, unspecified: Secondary | ICD-10-CM | POA: Diagnosis not present

## 2021-04-30 DIAGNOSIS — D869 Sarcoidosis, unspecified: Secondary | ICD-10-CM | POA: Diagnosis not present

## 2021-04-30 MED ORDER — NICOTINE 7 MG/24HR TD PT24
7.0000 mg | MEDICATED_PATCH | Freq: Every day | TRANSDERMAL | 0 refills | Status: DC
  Filled 2021-04-30: qty 28, 28d supply, fill #0

## 2021-04-30 NOTE — Progress Notes (Signed)
ID:  AQUAN KOPE, DOB [birth year], MRN [id number]  PCP:  [name], MD  Cardiologist: Rex Kras, DO, [facility] (established care 12/10/2020)  Date: 04/30/21 Last Office Visit: 04/17/2021  Chief Complaint  Patient presents with   Acute heart failure with preserved ejection fraction    Hospitalization Follow-up    HPI  [name] is a 65 y.o. male who presents to the office with a chief complaint of " heart failure management." Patient's past medical history and cardiovascular risk factors include: Sarcoidosis, HTN, Tobacco abuse, Hx of PE (was on Xarelto for 1 year), OSA not on CPAP, MSSA Bacteremia (March 2022), Hx of cardiac arrest (2002 during his hospitalization for pancreatitis per wife).  He is referred to the office at the request of [name], MD for evaluation of heart failure.  Patient is accompanied by his wife [name] at today's office visit.  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  Since discharge patient states that he is doing well from a cardiovascular standpoint.  His weight remains relatively stable with fluctuations between 1-3 pounds in either direction most likely secondary to his steroid use for pulmonary sarcoidosis.  Patient states that he has an upcoming office visit with pulmonary medicine in May 13, 2021.  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  Patient also had a sleep study yesterday and is awaiting results.  Most recent labs independently reviewed which notes stable renal function and NT proBNP.  Patient continues to smoke 0.5 packs/day.  FUNCTIONAL STATUS: No structured  exercise program or daily routine.   ALLERGIES: Allergies  Allergen Reactions   Codeine Nausea Only   Hydromorphone Other (See Comments)    hallucinations     MEDICATION LIST PRIOR TO VISIT: Current Meds  Medication Sig   aspirin 81 MG tablet Take 1 tablet (81 mg total) by mouth daily.   atorvastatin (LIPITOR) 20 MG tablet Take 20 mg by mouth daily.   BLACK ELDERBERRY PO Take 2 each by mouth daily as needed (immune support). Gummies   bumetanide (BUMEX) 1 MG tablet Take 1 tablet (1 mg total) by mouth daily.   dapagliflozin propanediol (FARXIGA) 10 MG TABS tablet Take 1 tablet (10 mg total) by mouth daily.   diltiazem (CARDIZEM CD) 360 MG 24 hr capsule Take 360 mg by mouth daily.   fluticasone (FLONASE) 50 MCG/ACT nasal spray Place 2 sprays into both nostrils daily as needed for allergies or rhinitis.   hydrALAZINE (APRESOLINE) 100 MG tablet Take 1 tablet (100 mg total) by mouth 3 (three) times daily.   isosorbide mononitrate (IMDUR) 30 MG 24 hr tablet Take 3 tablets (90 mg total) by mouth daily.   metoprolol succinate (TOPROL-XL) 200 MG 24 hr tablet Take 1 tablet (200 mg total) by mouth daily. Take with or immediately following a meal.   nicotine (NICODERM CQ - DOSED IN MG/24 HR) 7 mg/24hr patch Place 1 patch onto the skin daily.   pantoprazole (PROTONIX) 40 MG tablet Take 1 tablet (40 mg total) by mouth daily.   predniSONE (DELTASONE) 10 MG tablet 40 mg oral daily, decrease by 10 mg every 5 days.   tamsulosin (FLOMAX) 0.4 MG CAPS capsule Take  0.4 mg by mouth daily.   traZODone (DESYREL) 150 MG tablet Take 1 tablet (150 mg total) by mouth at bedtime as needed for sleep.     PAST MEDICAL HISTORY: Past Medical History:  Diagnosis Date   Back pain    CKD (chronic kidney disease), stage III (HCC)    CVA (cerebral infarction)    Diastolic dysfunction    ED (erectile dysfunction)    GERD (gastroesophageal reflux disease)    Hypercalcemia    Hyperlipidemia    Hypertension     Insomnia    Long-term use of high-risk medication    Nephrocalcinosis    Nephrolithiasis    Obesity    Osteopenia    Pancreatitis    PE (pulmonary embolism)    Proteinuria    Sarcoidosis    Smoker   Patient and wife denies hx of CVA.   PAST SURGICAL HISTORY: Past Surgical History:  Procedure Laterality Date   ABDOMINAL EXPLORATION SURGERY     BACK SURGERY     CHOLECYSTECTOMY     SHOULDER ARTHROSCOPY Right 10/30/2020   Procedure: ARTHROSCOPY SHOULDER WITH EXTENSIVE DEBRIDEMENT;  Surgeon: Mcarthur Rossetti, MD;  Location: [place];  Service: Orthopedics;  Laterality: Right;   SHOULDER SURGERY     TRACHEOSTOMY     closed    FAMILY HISTORY: The patient family history includes Alzheimer's disease in his mother; CVA in his father; Heart failure in his sister; Hypertension in his father and sister; Lung cancer in his father.  SOCIAL HISTORY:  The patient  reports that he quit smoking about 2 months ago. His smoking use included cigarettes. He started smoking about 15 years ago. He has a 3.75 pack-year smoking history. He has never used smokeless tobacco. He reports that he does not drink alcohol and does not use drugs.  REVIEW OF SYSTEMS: Review of Systems  Constitutional: Positive for malaise/fatigue (improved) and weight loss. Negative for chills, fever and weight gain.  HENT:  Negative for hoarse voice and nosebleeds.   Eyes:  Negative for discharge, double vision and pain.  Cardiovascular:  Negative for chest pain, claudication, dyspnea on exertion, leg swelling, near-syncope, orthopnea, palpitations, paroxysmal nocturnal dyspnea and syncope.  Respiratory:  Negative for hemoptysis and shortness of breath.   Musculoskeletal:  Negative for muscle cramps and myalgias.  Gastrointestinal:  Negative for abdominal pain, constipation, diarrhea, hematemesis, hematochezia, melena, nausea and vomiting.  Neurological:  Negative for dizziness and light-headedness.    PHYSICAL EXAM: Vitals with BMI 04/30/2021 04/29/2021 04/23/2021  Height '6\' 0"'  - -  Weight 229 lbs 226 lbs 6 oz -  BMI 35.57 - -  Systolic 322 025 427  Diastolic 96 062 87  Pulse 68 74 62    CONSTITUTIONAL: Appears older than stated age, well-developed and well-nourished. No acute distress.  SKIN: Skin is warm and dry. No rash noted. No cyanosis. No pallor. No jaundice HEAD: Normocephalic and atraumatic.  EYES: No scleral icterus MOUTH/THROAT: Moist oral membranes.  NECK: No JVD present. No thyromegaly noted. No carotid bruits  LYMPHATIC: No visible cervical adenopathy.  CHEST Normal respiratory effort. No intercostal retractions  LUNGS: Clear to auscultation bilaterally.  No stridor. No wheezes. No rales.  CARDIOVASCULAR: Regular, positive B7-S2, soft holosystolic murmur heard at the apex, no gallops or rubs. ABDOMINAL: Obese, soft soft, nontender, distended, positive bowel sounds in all 4 quadrants, no apparent ascites.  EXTREMITIES: Trace bilateral pitting edema, no open wounds or ulcers.   HEMATOLOGIC: No significant bruising NEUROLOGIC: Oriented  to person, place, and time. Nonfocal. Normal muscle tone.  PSYCHIATRIC: Normal mood and affect. Normal behavior. Cooperative  CARDIAC DATABASE: EKG: 12/18/2020: Sinus bradycardia, 58 bpm, left atrial enlargement, diffuse T wave inversions.  No significant change compared to prior ECG.  Echocardiogram: 03/23/2021: LVEF 60-65%, mild LVH, grade 1 diastolic impairment, RVSP 49 mmHg consistent with mild pulmonary hypertension, severely dilated left atrium, mild MR, mild AR,  Stress Testing: No results found for this or any previous visit from the past 1095 days.  Heart Catheterization: None  CT chest without contrast: 04/21/2021 1. Widespread patchy nodular thickening of the peribronchovascular interstitium throughout both lungs, most prominent in the upper lobes, with associated patchy peribronchovascular ground-glass opacity.  Masslike 3.7 cm focus of consolidation in the anterior right lower lobe, new. 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 Follow-up outpatient high-resolution chest CT suggested in 3-6 months. 2. Mild cardiomegaly. Three-vessel coronary atherosclerosis. 3. Dilated main pulmonary artery, suggesting pulmonary arterial hypertension. 4. Ectatic 4.4 cm ascending thoracic aorta. Recommend annual imaging followup by CTA or MRA. This recommendation follows 2010 ACCF/AHA/AATS/ACR/ASA/SCA/SCAI/SIR/STS/SVM Guidelines for the Diagnosis and Management of Patients with Thoracic Aortic Disease. Circulation. 2010; 121: B262-M355. Aortic aneurysm NOS (ICD10-I71.9). 5. Aortic Atherosclerosis (ICD10-I70.0).  LABORATORY DATA: CBC Latest Ref Rng & Units 04/22/2021 04/20/2021 04/19/2021  WBC 4.0 - 10.5 K/uL 10.0 4.6 5.7  Hemoglobin 13.0 - 17.0 g/dL 10.2(L) 10.6(L) 10.4(L)  Hematocrit 39.0 - 52.0 % 30.8(L) 33.0(L) 31.6(L)  Platelets 150 - 400 K/uL 221 218 228    CMP Latest Ref Rng & Units 04/27/2021 04/23/2021 04/22/2021  Glucose 65 - 99 mg/dL 123(H) 135(H) 163(H)  BUN 8 - 27 mg/dL 26 27(H) 25(H)  Creatinine 0.76 - 1.27 mg/dL 1.70(H) 1.84(H) 1.95(H)  Sodium 134 - 144 mmol/L 147(H) 142 138  Potassium 3.5 - 5.2 mmol/L 3.9 3.4(L) 3.6  Chloride 96 - 106 mmol/L 107(H) 100 101  CO2 20 - 29 mmol/L 28 32 31  Calcium 8.6 - 10.2 mg/dL 9.3 9.3 8.6(L)  Total Protein 6.5 - 8.1 g/dL - - -  Total Bilirubin 0.3 - 1.2 mg/dL - - -  Alkaline Phos 38 - 126 U/L - - -  AST 15 - 41 U/L - - -  ALT 0 - 44 U/L - - -    Lipid Panel     Component Value Date/Time   CHOL 111 04/09/2021 0147   TRIG 53 04/09/2021 0147   HDL 62 04/09/2021 0147   CHOLHDL 1.8 04/09/2021 0147   VLDL 11 04/09/2021 0147   LDLCALC 38 04/09/2021 0147    No components found for: NTPROBNP Recent Labs    12/15/20 0909 01/02/21 0752 01/16/21 0713 02/03/21 0717 03/06/21 0712 03/30/21 0707  04/06/21 0725 04/14/21 0712  PROBNP 4,473* 2,337* 1,789* 733* 1,171* 2,643* 5,653* 3,473*   Recent Labs    11/19/20 0233  TSH 1.247    BMP Recent Labs    04/21/21 0258 04/22/21 0149 04/23/21 0356 04/27/21 0715  NA 140 138 142 147*  K 3.7 3.6 3.4* 3.9  CL 101 101 100 107*  CO2 29 31 32 28  GLUCOSE 212* 163* 135* 123*  BUN 23 25* 27* 26  CREATININE 2.00* 1.95* 1.84* 1.70*  CALCIUM 8.7* 8.6* 9.3 9.3  GFRNONAA 37* 38* 40*  --     HEMOGLOBIN A1C Lab Results  Component Value Date   HGBA1C 5.7 (H) 04/08/2021   MPG 116.89 04/08/2021    External Labs: Collected: 12/08/2020 Creatinine 1.7 mg/dL. (Serum creatinine 1.6  mg/dL collected 07/11/2020) eGFR: 40.9 mL/min per 1.73 m Sodium 143, potassium 3.5, chloride 104, bicarb 32 Hemoglobin 12.6, hematocrit 38.2 NT proBNP 5576  IMPRESSION:    ICD-10-CM   1. Acute heart failure with preserved ejection fraction (HFpEF) (HCC)  I50.31 EKG 12-Lead    2. Dyspnea on exertion  R06.00     3. NSVT (nonsustained ventricular tachycardia) (HCC)  I47.2     4. Essential hypertension  I10     5. Mixed hyperlipidemia  E78.2     6. Hx pulmonary embolism  Z86.711     7. Tobacco abuse  Z72.0 nicotine (NICODERM CQ - DOSED IN MG/24 HR) 7 mg/24hr patch    8. OSA (obstructive sleep apnea)  G47.33     9. Sarcoidosis of lung ([place])  D86.0        RECOMMENDATIONS: [name] is a 65 y.o. male whose past medical history and cardiac risk factors include: Sarcoidosis, HTN, Tobacco abuse, Hx of PE (was on Xarelto for 1 year), OSA not on CPAP, MSSA Bacteremia (March 2022), Hx of cardiac arrest (2002 during his hospitalization for pancreatitis per wife).  Acute on Chronic heart failure with preserved EF, NYHA class II: Most recent labs from 04/27/2021 independently reviewed at today's visit. Weight remains relatively stable. Medications reconciled. Hospitalization records reviewed as a part of today's office visit. I have asked the patient  to take an additional dose of Bumex if his weight is greater than 3 pounds over the course of 24 hours. I suspect that some of the weight fluctuation is due to the steroids that he is currently getting for pulmonary sarcoidosis. Will need close monitoring to prevent hospitalizations for heart failure.  Dyspnea on exertion: Multifactorial given his lung sarcoidosis, HFpEF, URI symptoms, and tobacco use. Patient has had at least 3 hospitalizations for congestive heart failure/hypoxemic since 2022. Recent CT of the chest noted three-vessel coronary artery calcification, aortic atherosclerosis, and during hospitalization he was noted to have episodes of PVCs and NSVT.  I had a long discussion with both the patient and his wife [name] at today's office visit with regards to considering left and right heart catheterization to evaluate for obstructive CAD and hemodynamics respectively.  After discussing the risks, benefits, and alternatives they would like to proceed with right and left heart catheterization. The procedure of left and right heart catheterization with possible intervention was explained to the patient in detail.  The indication, alternatives, risks and benefits were reviewed.  Complications include but not limited to bleeding, infection, vascular injury, stroke, myocardial infection, arrhythmia, kidney injury leading to short or long-term hemodialysis, requiring temporary or permanent pacemaker,, radiation-related injury in the case of prolonged fluoroscopy use, emergency cardiac surgery, and death. The patient understands the risks of serious complication is 1-2 in 7017 with diagnostic cardiac cath and 1-2% or less with angioplasty/stenting.  The patient and his wife voices understanding and provides verbal feedback and wishes to proceed   Benign essential hypertension: Currently not well controlled. Just started on new doses of hydralazine/isosorbide. Asked him to give [place] a call in 1 week  to see what his blood pressure has been ranging at home. Medications reconciled. Low-salt diet recommended.  Tobacco use: Tobacco cessation counseling: Currently smoking 0.5 packs/day   Patient is willing to quit at this time. Approximately 9 mins were spent counseling patient cessation techniques. We discussed various methods to help quit smoking, including deciding on a date to quit, joining a support group, pharmacological agents- nicotine gum/patch/lozenges, chantix.  Will prescribe nicotine as this really helped him during his hospitalization I will reassess his progress at the next follow-up visit  FINAL MEDICATION LIST END OF ENCOUNTER: Meds ordered this encounter  Medications   nicotine (NICODERM CQ - DOSED IN MG/24 HR) 7 mg/24hr patch    Sig: Place 1 patch onto the skin daily.    Dispense:  28 patch    Refill:  0     There are no discontinued medications.    Current Outpatient Medications:    aspirin 81 MG tablet, Take 1 tablet (81 mg total) by mouth daily., Disp: , Rfl:    atorvastatin (LIPITOR) 20 MG tablet, Take 20 mg by mouth daily., Disp: , Rfl:    BLACK ELDERBERRY PO, Take 2 each by mouth daily as needed (immune support). Gummies, Disp: , Rfl:    bumetanide (BUMEX) 1 MG tablet, Take 1 tablet (1 mg total) by mouth daily., Disp: 30 tablet, Rfl: 0   dapagliflozin propanediol (FARXIGA) 10 MG TABS tablet, Take 1 tablet (10 mg total) by mouth daily., Disp: 30 tablet, Rfl: 0   diltiazem (CARDIZEM CD) 360 MG 24 hr capsule, Take 360 mg by mouth daily., Disp: , Rfl:    fluticasone (FLONASE) 50 MCG/ACT nasal spray, Place 2 sprays into both nostrils daily as needed for allergies or rhinitis., Disp: , Rfl:    hydrALAZINE (APRESOLINE) 100 MG tablet, Take 1 tablet (100 mg total) by mouth 3 (three) times daily., Disp: 90 tablet, Rfl: 3   isosorbide mononitrate (IMDUR) 30 MG 24 hr tablet, Take 3 tablets (90 mg total) by mouth daily., Disp: 90 tablet, Rfl: 6   metoprolol succinate  (TOPROL-XL) 200 MG 24 hr tablet, Take 1 tablet (200 mg total) by mouth daily. Take with or immediately following a meal., Disp: 30 tablet, Rfl: 0   nicotine (NICODERM CQ - DOSED IN MG/24 HR) 7 mg/24hr patch, Place 1 patch onto the skin daily., Disp: 28 patch, Rfl: 0   pantoprazole (PROTONIX) 40 MG tablet, Take 1 tablet (40 mg total) by mouth daily., Disp: 30 tablet, Rfl: 1   predniSONE (DELTASONE) 10 MG tablet, 40 mg oral daily, decrease by 10 mg every 5 days., Disp: 45 tablet, Rfl: 0   tamsulosin (FLOMAX) 0.4 MG CAPS capsule, Take 0.4 mg by mouth daily., Disp: , Rfl:    traZODone (DESYREL) 150 MG tablet, Take 1 tablet (150 mg total) by mouth at bedtime as needed for sleep., Disp: 90 tablet, Rfl: 3  Orders Placed This Encounter  Procedures   EKG 12-Lead     There are no Patient Instructions on file for this visit.  --Continue cardiac medications as reconciled in final medication list. --Return in about 4 weeks (around 05/28/2021) for Post heart catheterization. Or sooner if needed. --Continue follow-up with your primary care physician regarding the management of your other chronic comorbid conditions.  Patient's questions and concerns were addressed to his satisfaction. He voices understanding of the instructions provided during this encounter.   This note was created using a voice recognition software as a result there may be grammatical errors inadvertently enclosed that do not reflect the nature of this encounter. Every attempt is made to correct such errors.   Rex Kras, [place], [facility]  Pager: [phone] Office: [phone]

## 2021-05-04 NOTE — Addendum Note (Signed)
Addended by: Larey Seat on: 05/04/2021 01:11 PM   Modules accepted: Orders

## 2021-05-04 NOTE — Progress Notes (Signed)
POLYSOMNOGRAPHY IMPRESSION:   1. Obstructive Sleep Apnea (OSA) with a REM dependent apnea form and prolonged hypoxia.  2. Dysfunctions associated with sleep stages or arousals from sleep until CPAP was initiated.  3. abnormal EKG CPAP without EPR at 10 cm water was able to control the apnea and improve the patient's hypoxia.  No added oxygen was needed. The patient does best in reclined position, sleeping with the chest and head elevated.   RECOMMENDATIONS: CPAP to be set 6-11cm water, no EPR and the patient is to elevate the head of bed or use a wedge. FFM of his choice was a wide F30 i.    A follow up appointment will be scheduled with NP in the Sleep Clinic at [facility].

## 2021-05-04 NOTE — Procedures (Signed)
PATIENT'S NAME:  [name], [name] DOB:      1955-12-12      MR#:    [id number]     DATE OF RECORDING: 04/29/2021 REFERRING M.D.:  Manuel Baton, MD Study Performed:  Split-Night Titration Study HISTORY:  [name] was an established sleep patient in my practice, but today's visit was arranged by the hospitalist at [facility] after a stay in hospital:.   Home sleep test on December 10, 2019, his past medical history as stated below, he had mild sleep apnea with an AHI of 15.5 but this was strongly REM sleep dependent REM apnea-hypopnea index rose to 37/h he was borderline tachycardia bradycardic there was no positional component there were 34 minutes of hypoxemia time noted 8% of the recorded sleep time.  Based on this data CPAP is actually the only treatment option as hypoxemia cannot be corrected by a dental device or inspire device, neither can REM dependent apnea. The patient has decided not to pursue CPAP but there is really no alternative for this form of apnea. He reports today he" can't stand CPAP" Hospitalization between the 15th and 21 November 2020: He is a 65 year old African American gentleman with a history of sarcoidosis chronically treated with prednisone 5 mg; he has chronic kidney disease probably stage III b; obstructive sleep apnea but not on CPAP, hypertension, and rotator cuff tear. He had been presented for a repair of his rotator cuff tear 2 weeks prior to the hospitalization when he presented to the emergency room on 3-15 with confusion.  He was mildly hypoxic, and his chest x-ray had demonstrated vascular congestion. He was also febrile, To rule out a pulmonary embolism a VQ scan was obtained, and negative empiric heparin was discontinued. The LP result was not consistent with meningitis.   His mental status normalized with antibiotic therapy. The patient has a history of gram-positive bacteremia, or sarcoidosis which may not affect just the lung but also the brain, essential  hypertension chronic kidney disease, tobacco abuse, obstructive sleep apnea in the past, currently not treated with CPAP.  Acute encephalopathy, acute renal failure, acute respiratory failure. The patient endorsed the Epworth Sleepiness Scale at 2/24 points   The patient's weight 236 pounds with a height of 72 (inches), resulting in a BMI of 32.0 kg/m2. The patient's neck circumference measured 18 inches.  CURRENT MEDICATIONS: Proventil HFA, Aspirin, Lipitor, Black Elderberry, Bumex, Cardizem CD, Flonase, Klor-Con, Normodyne, Melatonin, Deltasone, Flomax, Desyrel  PROCEDURE:  This is a multichannel digital polysomnogram utilizing the Somnostar 11.2 system.  Electrodes and sensors were applied and monitored per AASM Specifications.   EEG, EOG, Chin and Limb EMG, were sampled at 200 Hz.  ECG, Snore and Nasal Pressure, Thermal Airflow, Respiratory Effort, CPAP Flow and Pressure, Oximetry was sampled at 50 Hz. Digital video and audio were recorded.       BASELINE STUDY WITHOUT CPAP RESULTS Lights Out was at 22:05 and Lights On at 05:11.  Total recording time (TRT) was 190.5, with a total sleep time (TST) of 121 minutes.   The patient's sleep latency was 50 minutes.  REM latency was 95.5 minutes.  The sleep efficiency was 63.5 %.    SLEEP ARCHITECTURE: WASO (Wake after sleep onset) was 17.5 minutes, Stage N1 was 34 minutes, Stage N2 was 52 minutes, Stage N3 was 17 minutes and Stage R (REM sleep) was 18 minutes.  The percentages were Stage N1 28.1%, Stage N2 43.%, Stage N3 14.% and Stage R (REM sleep) 14.9%.  RESPIRATORY ANALYSIS:  There were a total of 47 respiratory events:  1 obstructive apnea, 0 central apneas and 0 mixed apneas with a total of 1 apnea and an apnea index (AI) of 0.5.  There were 46 hypopneas with a hypopnea index of 22.8. The patient also had 0 respiratory event related arousals (RERAs).      The total APNEA/HYPOPNEA INDEX (AHI) was 23.3 /hour.  12 events occurred in REM sleep and  68 events in NREM. The REM AHI was 40 /hour versus a non-REM AHI of 20.4 /hour. The patient spent 42 minutes sleep time in the supine position 272 minutes in non-supine. The supine AHI was 18.9 /hour versus a non-supine AHI of 24.6 /hour.  OXYGEN SATURATION & C02:  The wake baseline 02 saturation was 92%, with the lowest being 78%. Time spent below 89% saturation equaled 89 minutes.  PERIODIC LIMB MOVEMENTS: The patient had a total of 0 Periodic Limb Movements.    Snoring was noted EKG was in irregular rhythm. Bradycardia was noted.    TITRATION STUDY WITH CPAP RESULTS:   CPAP was initiated under Simplus FFM but changed back to an F 30I mask, in wide form.  The patient slept propped up, on wedge and pillow.  Beginning at 5 cmH20 with heated humidity per AASM split night standards, the pressure was advanced to 10 cmH20 because of hypopneas, apneas and desaturations.  At a PAP pressure of 10 cmH20, there was a reduction of the AHI to 0.0 /hour.   Total recording time (TRT) was 235.5 minutes, with a total sleep time (TST) of 193 minutes. The patient's sleep latency was 22 minutes. REM latency was 137 minutes.  The sleep efficiency was 82. %.    SLEEP ARCHITECTURE: Wake after sleep was 35 minutes, Stage N1 25 minutes, Stage N2 124 minutes, Stage N3 0 minutes and Stage R (REM sleep) 44 minutes. The percentages were: Stage N1 13.%, Stage N2 64.2%, Stage N3 0% and Stage R (REM sleep) 22.8%. The sleep architecture was notable for sustained sleep after 8 cm CPAP were reached and exceeded. Uninterrupted REM sleep under 8,9 and 10 cm water CPAP were noted.   The arousals were noted as: 4 were spontaneous, 0 were associated with PLMs, 6 were associated with respiratory events.  RESPIRATORY ANALYSIS:  There were a total of 36 respiratory events: 0 obstructive apneas, 0 central apneas and 0 mixed apneas with a total of 0 apneas and an apnea index (AI) of 0. There were 36 hypopneas with a hypopnea index of  11.2 /hour.  The patient also had 0 respiratory event related arousals (RERAs).      The total APNEA/HYPOPNEA INDEX (AHI) was 11.2 /hour. 9 events occurred in REM sleep and 27 events in NREM. The REM AHI was 12.3 /hour versus a non-REM AHI of 10.9 /hour. The patient spent 7% of total sleep time in the supine position. The patient preferred a reclined position.  The supine AHI was 26.7/hour, versus a non-supine AHI of 10.0/hour.  OXYGEN SATURATION & C02:  The wake baseline 02 saturation was 91%, with the lowest being 83%. Time spent below 89% saturation equaled 16 minutes. PERIODIC LIMB MOVEMENTS:    The patient had a total of 0 Periodic Limb Movements.   POLYSOMNOGRAPHY IMPRESSION :   Obstructive Sleep Apnea (OSA) with a REM dependent apnea form and prolonged hypoxia.  Dysfunctions associated with sleep stages or arousals from sleep until CPAP was initiated.  abnormal EKG CPAP without EPR at  10 cm water was able to control the apnea and improve the patient's hypoxia.  No added oxygen was needed. The patient does best in reclined position, sleeping with the chest and head elevated.   RECOMMENDATIONS: CPAP to be set 6-11cm water, no EPR and the patient is to elevate the head of bed or use a wedge. FFM of his choice was a wide F30 i.    A follow up appointment will be scheduled with NP in the Sleep Clinic at [facility].     I certify that I have reviewed the entire raw data recording prior to the issuance of this report in accordance with the Standards of Accreditation of the American Academy of Sleep Medicine (AASM)  Larey Seat, M.D. Diplomat, Tax adviser of Psychiatry and Neurology  Diplomat, Tax adviser of Sleep Medicine Market researcher, [employer] [facility] at [employer]

## 2021-05-05 ENCOUNTER — Telehealth: Payer: Self-pay

## 2021-05-05 DIAGNOSIS — G4733 Obstructive sleep apnea (adult) (pediatric): Secondary | ICD-10-CM

## 2021-05-05 NOTE — Telephone Encounter (Signed)
I called pt. I advised pt that Dr. Brett Fairy reviewed their sleep study results and found that pt did well with cpap. Dr. Brett Fairy recommends that pt start a cpap at home. I reviewed PAP compliance expectations with the pt. Pt is agreeable to starting a CPAP. I advised pt that an order will be sent to a DME, [facility], and [facility] will call the pt within about one week after they file with the pt's insurance. [facility] will show the pt how to use the machine, fit for masks, and troubleshoot the CPAP if needed. A follow up appt was made for insurance purposes with Dr. Brett Fairy on 08/24/2021 at 8:30am. Pt verbalized understanding to arrive 15 minutes early and bring their CPAP. A letter with all of this information in it will be mailed to the pt as a reminder. I verified with the pt that the address we have on file is correct. Pt verbalized understanding of results. Pt had no questions at this time but was encouraged to call back if questions arise. I have sent the order to [facility] and have received confirmation that they have received the order.

## 2021-05-05 NOTE — Telephone Encounter (Signed)
-----   Message from Larey Seat, MD sent at 05/04/2021  1:11 PM EDT ----- POLYSOMNOGRAPHY IMPRESSION:   1. Obstructive Sleep Apnea (OSA) with a REM dependent apnea form and prolonged hypoxia.  2. Dysfunctions associated with sleep stages or arousals from sleep until CPAP was initiated.  3. abnormal EKG CPAP without EPR at 10 cm water was able to control the apnea and improve the patient's hypoxia.  No added oxygen was needed. The patient does best in reclined position, sleeping with the chest and head elevated.   RECOMMENDATIONS: CPAP to be set 6-11cm water, no EPR and the patient is to elevate the head of bed or use a wedge. FFM of his choice was a wide F30 i.    A follow up appointment will be scheduled with NP in the Sleep Clinic at [facility].

## 2021-05-05 NOTE — Telephone Encounter (Signed)
I called patient to discuss. No answer, left a voicemail asking him to call me back. 

## 2021-05-06 DIAGNOSIS — I10 Essential (primary) hypertension: Secondary | ICD-10-CM | POA: Diagnosis not present

## 2021-05-06 DIAGNOSIS — I5031 Acute diastolic (congestive) heart failure: Secondary | ICD-10-CM | POA: Diagnosis not present

## 2021-05-07 LAB — BASIC METABOLIC PANEL
BUN/Creatinine Ratio: 14 (ref 10–24)
BUN: 21 mg/dL (ref 8–27)
CO2: 28 mmol/L (ref 20–29)
Calcium: 8.9 mg/dL (ref 8.6–10.2)
Chloride: 105 mmol/L (ref 96–106)
Creatinine, Ser: 1.53 mg/dL — ABNORMAL HIGH (ref 0.76–1.27)
Glucose: 122 mg/dL — ABNORMAL HIGH (ref 65–99)
Potassium: 4 mmol/L (ref 3.5–5.2)
Sodium: 146 mmol/L — ABNORMAL HIGH (ref 134–144)
eGFR: 50 mL/min/{1.73_m2} — ABNORMAL LOW (ref >59)

## 2021-05-07 LAB — MAGNESIUM: Magnesium: 2.3 mg/dL (ref 1.6–2.3)

## 2021-05-07 LAB — PRO B NATRIURETIC PEPTIDE: NT-Pro BNP: 3912 pg/mL — ABNORMAL HIGH (ref 0–210)

## 2021-05-08 ENCOUNTER — Other Ambulatory Visit (HOSPITAL_COMMUNITY): Payer: Self-pay

## 2021-05-08 ENCOUNTER — Other Ambulatory Visit: Payer: Self-pay | Admitting: Cardiology

## 2021-05-08 DIAGNOSIS — I5031 Acute diastolic (congestive) heart failure: Secondary | ICD-10-CM

## 2021-05-08 MED ORDER — DAPAGLIFLOZIN PROPANEDIOL 10 MG PO TABS
10.0000 mg | ORAL_TABLET | Freq: Every day | ORAL | 0 refills | Status: DC
  Filled 2021-05-08: qty 90, 90d supply, fill #0

## 2021-05-08 MED ORDER — ENTRESTO 97-103 MG PO TABS
0.5000 | ORAL_TABLET | Freq: Two times a day (BID) | ORAL | 0 refills | Status: AC
Start: 2021-05-08 — End: 2021-06-07

## 2021-05-08 NOTE — Addendum Note (Signed)
Addended by: Lester Quintana A on: 05/08/2021 08:05 AM   Modules accepted: Orders

## 2021-05-08 NOTE — Telephone Encounter (Signed)
Received this notice from [facility]: "Hey we are missing the pressure on this RX. Can you please put a new one in there with the pressures?"  Order has been fixed per Dr. Edwena Felty sleep study result note.

## 2021-05-11 DIAGNOSIS — I5030 Unspecified diastolic (congestive) heart failure: Secondary | ICD-10-CM

## 2021-05-12 ENCOUNTER — Other Ambulatory Visit (HOSPITAL_COMMUNITY): Payer: Self-pay

## 2021-05-12 ENCOUNTER — Other Ambulatory Visit: Payer: Self-pay

## 2021-05-12 ENCOUNTER — Ambulatory Visit (HOSPITAL_COMMUNITY)
Admission: RE | Disposition: A | Discharge status: Home / Self Care | Payer: Self-pay | Source: Ambulatory Visit | Attending: Cardiology

## 2021-05-12 ENCOUNTER — Encounter (HOSPITAL_COMMUNITY): Payer: Self-pay | Admitting: Cardiology

## 2021-05-12 ENCOUNTER — Ambulatory Visit (HOSPITAL_COMMUNITY)
Admission: RE | Admit: 2021-05-12 | Discharge: 2021-05-12 | Disposition: A | Discharge status: Home / Self Care | Payer: PPO | Source: Ambulatory Visit | Attending: Cardiology | Admitting: Cardiology

## 2021-05-12 DIAGNOSIS — I2510 Atherosclerotic heart disease of native coronary artery without angina pectoris: Secondary | ICD-10-CM | POA: Diagnosis not present

## 2021-05-12 DIAGNOSIS — I5030 Unspecified diastolic (congestive) heart failure: Secondary | ICD-10-CM

## 2021-05-12 DIAGNOSIS — I5031 Acute diastolic (congestive) heart failure: Secondary | ICD-10-CM | POA: Diagnosis not present

## 2021-05-12 DIAGNOSIS — Z86711 Personal history of pulmonary embolism: Secondary | ICD-10-CM | POA: Diagnosis not present

## 2021-05-12 DIAGNOSIS — I5032 Chronic diastolic (congestive) heart failure: Secondary | ICD-10-CM | POA: Diagnosis not present

## 2021-05-12 DIAGNOSIS — I472 Ventricular tachycardia: Secondary | ICD-10-CM | POA: Insufficient documentation

## 2021-05-12 DIAGNOSIS — Z885 Allergy status to narcotic agent status: Secondary | ICD-10-CM | POA: Insufficient documentation

## 2021-05-12 DIAGNOSIS — F17210 Nicotine dependence, cigarettes, uncomplicated: Secondary | ICD-10-CM | POA: Insufficient documentation

## 2021-05-12 DIAGNOSIS — G4733 Obstructive sleep apnea (adult) (pediatric): Secondary | ICD-10-CM | POA: Diagnosis not present

## 2021-05-12 DIAGNOSIS — Z7982 Long term (current) use of aspirin: Secondary | ICD-10-CM | POA: Diagnosis not present

## 2021-05-12 DIAGNOSIS — E782 Mixed hyperlipidemia: Secondary | ICD-10-CM | POA: Diagnosis not present

## 2021-05-12 DIAGNOSIS — R0600 Dyspnea, unspecified: Secondary | ICD-10-CM | POA: Diagnosis not present

## 2021-05-12 DIAGNOSIS — Z79899 Other long term (current) drug therapy: Secondary | ICD-10-CM | POA: Diagnosis not present

## 2021-05-12 DIAGNOSIS — I110 Hypertensive heart disease with heart failure: Secondary | ICD-10-CM | POA: Insufficient documentation

## 2021-05-12 DIAGNOSIS — D860 Sarcoidosis of lung: Secondary | ICD-10-CM | POA: Insufficient documentation

## 2021-05-12 HISTORY — PX: RIGHT/LEFT HEART CATH AND CORONARY ANGIOGRAPHY: CATH118266

## 2021-05-12 LAB — POCT I-STAT 7, (LYTES, BLD GAS, ICA,H+H)
Acid-Base Excess: 2 mmol/L (ref 0.0–2.0)
Bicarbonate: 28.6 mmol/L — ABNORMAL HIGH (ref 20.0–28.0)
Calcium, Ion: 1.15 mmol/L (ref 1.15–1.40)
HCT: 34 % — ABNORMAL LOW (ref 39.0–52.0)
Hemoglobin: 11.6 g/dL — ABNORMAL LOW (ref 13.0–17.0)
O2 Saturation: 95 %
Potassium: 3.5 mmol/L (ref 3.5–5.1)
Sodium: 146 mmol/L — ABNORMAL HIGH (ref 135–145)
TCO2: 30 mmol/L (ref 22–32)
pCO2 arterial: 54.7 mmHg — ABNORMAL HIGH (ref 32.0–48.0)
pH, Arterial: 7.327 — ABNORMAL LOW (ref 7.350–7.450)
pO2, Arterial: 85 mmHg (ref 83.0–108.0)

## 2021-05-12 LAB — POCT I-STAT EG7
Acid-Base Excess: 2 mmol/L (ref 0.0–2.0)
Bicarbonate: 29.7 mmol/L — ABNORMAL HIGH (ref 20.0–28.0)
Calcium, Ion: 1.15 mmol/L (ref 1.15–1.40)
HCT: 35 % — ABNORMAL LOW (ref 39.0–52.0)
Hemoglobin: 11.9 g/dL — ABNORMAL LOW (ref 13.0–17.0)
O2 Saturation: 73 %
Potassium: 3.5 mmol/L (ref 3.5–5.1)
Sodium: 146 mmol/L — ABNORMAL HIGH (ref 135–145)
TCO2: 31 mmol/L (ref 22–32)
pCO2, Ven: 59.3 mmHg (ref 44.0–60.0)
pH, Ven: 7.307 (ref 7.250–7.430)
pO2, Ven: 44 mmHg (ref 32.0–45.0)

## 2021-05-12 SURGERY — RIGHT/LEFT HEART CATH AND CORONARY ANGIOGRAPHY
Anesthesia: LOCAL

## 2021-05-12 MED ORDER — IOHEXOL 350 MG/ML SOLN
INTRAVENOUS | Status: DC | PRN
  Administered 2021-05-12: 45 mL via INTRA_ARTERIAL

## 2021-05-12 MED ORDER — SODIUM CHLORIDE 0.9% FLUSH: 3.0000 mL | INTRAVENOUS | Status: DC | PRN

## 2021-05-12 MED ORDER — LIDOCAINE HCL (PF) 1 % IJ SOLN
INTRAMUSCULAR | Status: DC | PRN
  Administered 2021-05-12: 2 mL via SUBCUTANEOUS
  Administered 2021-05-12: 5 mL via SUBCUTANEOUS

## 2021-05-12 MED ORDER — MIDAZOLAM HCL 2 MG/2ML IJ SOLN
INTRAMUSCULAR | Status: DC | PRN
  Administered 2021-05-12 (×2): 1 mg via INTRAVENOUS

## 2021-05-12 MED ORDER — HYDRALAZINE HCL 20 MG/ML IJ SOLN: 10.0000 mg | INTRAMUSCULAR | Status: DC | PRN

## 2021-05-12 MED ORDER — HEPARIN SODIUM (PORCINE) 1000 UNIT/ML IJ SOLN
INTRAMUSCULAR | Status: AC
  Filled 2021-05-12: qty 1

## 2021-05-12 MED ORDER — ACETAMINOPHEN 325 MG PO TABS: 650.0000 mg | ORAL_TABLET | ORAL | Status: DC | PRN

## 2021-05-12 MED ORDER — FENTANYL CITRATE (PF) 100 MCG/2ML IJ SOLN
INTRAMUSCULAR | Status: DC | PRN
  Administered 2021-05-12 (×2): 25 ug via INTRAVENOUS

## 2021-05-12 MED ORDER — MIDAZOLAM HCL 2 MG/2ML IJ SOLN
INTRAMUSCULAR | Status: AC
  Filled 2021-05-12: qty 2

## 2021-05-12 MED ORDER — SODIUM CHLORIDE 0.9 % IV SOLN: 250.0000 mL | INTRAVENOUS | Status: DC | PRN

## 2021-05-12 MED ORDER — DAPAGLIFLOZIN PROPANEDIOL 10 MG PO TABS
10.0000 mg | ORAL_TABLET | Freq: Every day | ORAL | 0 refills | Status: DC
Start: 2021-05-12 — End: 2021-05-26
  Filled 2021-05-12: qty 30, 30d supply, fill #0
  Filled 2021-05-14: qty 90, 90d supply, fill #0

## 2021-05-12 MED ORDER — LIDOCAINE HCL (PF) 1 % IJ SOLN
INTRAMUSCULAR | Status: AC
  Filled 2021-05-12: qty 30

## 2021-05-12 MED ORDER — VERAPAMIL HCL 2.5 MG/ML IV SOLN
INTRAVENOUS | Status: AC
  Filled 2021-05-12: qty 2

## 2021-05-12 MED ORDER — ASPIRIN 81 MG PO CHEW: 81.0000 mg | CHEWABLE_TABLET | ORAL | Status: AC

## 2021-05-12 MED ORDER — VERAPAMIL HCL 2.5 MG/ML IV SOLN
INTRAVENOUS | Status: DC | PRN
  Administered 2021-05-12: 10 mL via INTRA_ARTERIAL

## 2021-05-12 MED ORDER — SODIUM CHLORIDE 0.9 % IV SOLN: INTRAVENOUS | Status: AC

## 2021-05-12 MED ORDER — SODIUM CHLORIDE 0.9% FLUSH: 3.0000 mL | Freq: Two times a day (BID) | INTRAVENOUS | Status: DC

## 2021-05-12 MED ORDER — HEPARIN SODIUM (PORCINE) 1000 UNIT/ML IJ SOLN
INTRAMUSCULAR | Status: DC | PRN
  Administered 2021-05-12: 5000 [IU] via INTRAVENOUS

## 2021-05-12 MED ORDER — SODIUM CHLORIDE 0.9 % IV SOLN: INTRAVENOUS | Status: DC

## 2021-05-12 MED ORDER — FENTANYL CITRATE (PF) 100 MCG/2ML IJ SOLN
INTRAMUSCULAR | Status: AC
  Filled 2021-05-12: qty 2

## 2021-05-12 MED ORDER — LABETALOL HCL 5 MG/ML IV SOLN: 10.0000 mg | INTRAVENOUS | Status: DC | PRN

## 2021-05-12 MED ORDER — HEPARIN (PORCINE) IN NACL 1000-0.9 UT/500ML-% IV SOLN
INTRAVENOUS | Status: AC
  Filled 2021-05-12: qty 1000

## 2021-05-12 SURGICAL SUPPLY — 11 items
CATH 5FR JL3.5 JR4 ANG PIG MP (CATHETERS) ×1 IMPLANT
CATH BALLN WEDGE 5F 110CM (CATHETERS) ×1 IMPLANT
DEVICE RAD COMP TR BAND LRG (VASCULAR PRODUCTS) ×1 IMPLANT
GLIDESHEATH SLEND A-KIT 6F 22G (SHEATH) ×1 IMPLANT
GUIDEWIRE INQWIRE 1.5J.035X260 (WIRE) IMPLANT
INQWIRE 1.5J .035X260CM (WIRE) ×2
KIT HEART LEFT (KITS) ×2 IMPLANT
PACK CARDIAC CATHETERIZATION (CUSTOM PROCEDURE TRAY) ×2 IMPLANT
SHEATH GLIDE SLENDER 4/5FR (SHEATH) ×1 IMPLANT
TRANSDUCER W/STOPCOCK (MISCELLANEOUS) ×2 IMPLANT
TUBING CIL FLEX 10 FLL-RA (TUBING) ×2 IMPLANT

## 2021-05-12 NOTE — Discharge Instructions (Signed)

## 2021-05-12 NOTE — H&P (Signed)
OV 04/10/2021 copied for documentation    ID:  [name], [name] [birth year], MRN [id number]  PCP:  [name], MD  Cardiologist: Rex Kras, DO, [facility] (established care 12/10/2020)  Date: 04/30/21 Last Office Visit: 04/17/2021  Chief Complaint  Patient presents with   Acute heart failure with preserved ejection fraction    Hospitalization Follow-up    HPI  [name] is a 65 y.o. male who presents to the office with a chief complaint of " heart failure management." Patient's past medical history and cardiovascular risk factors include: Sarcoidosis, HTN, Tobacco abuse, Hx of PE (was on Xarelto for 1 year), OSA not on CPAP, MSSA Bacteremia (March 2022), Hx of cardiac arrest (2002 during his hospitalization for pancreatitis per wife).  He is referred to the office at the request of [name], MD for evaluation of heart failure.  Patient is accompanied by his wife [name] at today's office visit.  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  Since discharge patient states that he is doing well from a cardiovascular standpoint.  His weight remains relatively stable with fluctuations between 1-3 pounds in either direction most likely secondary to his steroid use for pulmonary sarcoidosis.  Patient states that he has an upcoming office visit with pulmonary medicine in May 13, 2021.  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  Patient also had a sleep study yesterday and is awaiting results.  Most recent labs independently reviewed which notes stable renal function and NT proBNP.  Patient continues to smoke 0.5  packs/day.  FUNCTIONAL STATUS: No structured exercise program or daily routine.   ALLERGIES: Allergies  Allergen Reactions   Codeine Nausea Only   Hydromorphone Other (See Comments)    hallucinations     MEDICATION LIST PRIOR TO VISIT: Current Meds  Medication Sig   aspirin 81 MG tablet Take 1 tablet (81 mg total) by mouth daily.   atorvastatin (LIPITOR) 20 MG tablet Take 20 mg by mouth daily.   BLACK ELDERBERRY PO Take 2 each by mouth daily as needed (immune support). Gummies   bumetanide (BUMEX) 1 MG tablet Take 1 tablet (1 mg total) by mouth daily.   dapagliflozin propanediol (FARXIGA) 10 MG TABS tablet Take 1 tablet (10 mg total) by mouth daily.   diltiazem (CARDIZEM CD) 360 MG 24 hr capsule Take 360 mg by mouth daily.   fluticasone (FLONASE) 50 MCG/ACT nasal spray Place 2 sprays into both nostrils daily as needed for allergies or rhinitis.   hydrALAZINE (APRESOLINE) 100 MG tablet Take 1 tablet (100 mg total) by mouth 3 (three) times daily.   isosorbide mononitrate (IMDUR) 30 MG 24 hr tablet Take 3 tablets (90 mg total) by mouth daily.   metoprolol succinate (TOPROL-XL) 200 MG 24 hr tablet Take 1 tablet (200 mg total) by mouth daily. Take with or immediately following a meal.   nicotine (NICODERM CQ - DOSED IN MG/24 HR) 7 mg/24hr patch Place 1 patch onto the skin daily.   pantoprazole (PROTONIX) 40 MG tablet Take 1 tablet (40 mg total) by mouth daily.   predniSONE (DELTASONE) 10 MG tablet 40 mg oral daily, decrease by 10 mg every 5 days.   tamsulosin (  FLOMAX) 0.4 MG CAPS capsule Take 0.4 mg by mouth daily.   traZODone (DESYREL) 150 MG tablet Take 1 tablet (150 mg total) by mouth at bedtime as needed for sleep.     PAST MEDICAL HISTORY: Past Medical History:  Diagnosis Date   Back pain    CKD (chronic kidney disease), stage III (HCC)    CVA (cerebral infarction)    Diastolic dysfunction    ED (erectile dysfunction)    GERD (gastroesophageal reflux disease)     Hypercalcemia    Hyperlipidemia    Hypertension    Insomnia    Long-term use of high-risk medication    Nephrocalcinosis    Nephrolithiasis    Obesity    Osteopenia    Pancreatitis    PE (pulmonary embolism)    Proteinuria    Sarcoidosis    Smoker   Patient and wife denies hx of CVA.   PAST SURGICAL HISTORY: Past Surgical History:  Procedure Laterality Date   ABDOMINAL EXPLORATION SURGERY     BACK SURGERY     CHOLECYSTECTOMY     SHOULDER ARTHROSCOPY Right 10/30/2020   Procedure: ARTHROSCOPY SHOULDER WITH EXTENSIVE DEBRIDEMENT;  Surgeon: Mcarthur Rossetti, MD;  Location: [place];  Service: Orthopedics;  Laterality: Right;   SHOULDER SURGERY     TRACHEOSTOMY     closed    FAMILY HISTORY: The patient family history includes Alzheimer's disease in his mother; CVA in his father; Heart failure in his sister; Hypertension in his father and sister; Lung cancer in his father.  SOCIAL HISTORY:  The patient  reports that he quit smoking about 2 months ago. His smoking use included cigarettes. He started smoking about 15 years ago. He has a 3.75 pack-year smoking history. He has never used smokeless tobacco. He reports that he does not drink alcohol and does not use drugs.  REVIEW OF SYSTEMS: Review of Systems  Constitutional: Positive for malaise/fatigue (improved) and weight loss. Negative for chills, fever and weight gain.  HENT:  Negative for hoarse voice and nosebleeds.   Eyes:  Negative for discharge, double vision and pain.  Cardiovascular:  Negative for chest pain, claudication, dyspnea on exertion, leg swelling, near-syncope, orthopnea, palpitations, paroxysmal nocturnal dyspnea and syncope.  Respiratory:  Negative for hemoptysis and shortness of breath.   Musculoskeletal:  Negative for muscle cramps and myalgias.  Gastrointestinal:  Negative for abdominal pain, constipation, diarrhea, hematemesis, hematochezia, melena, nausea and vomiting.   Neurological:  Negative for dizziness and light-headedness.   PHYSICAL EXAM: Vitals with BMI 04/30/2021 04/29/2021 04/23/2021  Height '6\' 0"'  - -  Weight 229 lbs 226 lbs 6 oz -  BMI 53.74 - -  Systolic 827 078 675  Diastolic 96 449 87  Pulse 68 74 62    CONSTITUTIONAL: Appears older than stated age, well-developed and well-nourished. No acute distress.  SKIN: Skin is warm and dry. No rash noted. No cyanosis. No pallor. No jaundice HEAD: Normocephalic and atraumatic.  EYES: No scleral icterus MOUTH/THROAT: Moist oral membranes.  NECK: No JVD present. No thyromegaly noted. No carotid bruits  LYMPHATIC: No visible cervical adenopathy.  CHEST Normal respiratory effort. No intercostal retractions  LUNGS: Clear to auscultation bilaterally.  No stridor. No wheezes. No rales.  CARDIOVASCULAR: Regular, positive E0-F0, soft holosystolic murmur heard at the apex, no gallops or rubs. ABDOMINAL: Obese, soft soft, nontender, distended, positive bowel sounds in all 4 quadrants, no apparent ascites.  EXTREMITIES: Trace bilateral pitting edema, no open wounds or ulcers.  HEMATOLOGIC: No significant bruising NEUROLOGIC: Oriented to person, place, and time. Nonfocal. Normal muscle tone.  PSYCHIATRIC: Normal mood and affect. Normal behavior. Cooperative  CARDIAC DATABASE: EKG: 12/18/2020: Sinus bradycardia, 58 bpm, left atrial enlargement, diffuse T wave inversions.  No significant change compared to prior ECG.  Echocardiogram: 03/23/2021: LVEF 60-65%, mild LVH, grade 1 diastolic impairment, RVSP 49 mmHg consistent with mild pulmonary hypertension, severely dilated left atrium, mild MR, mild AR,  Stress Testing: No results found for this or any previous visit from the past 1095 days.  Heart Catheterization: None  CT chest without contrast: 04/21/2021 1. Widespread patchy nodular thickening of the peribronchovascular interstitium throughout both lungs, most prominent in the upper lobes, with  associated patchy peribronchovascular ground-glass opacity. Masslike 3.7 cm focus of consolidation in the anterior right lower lobe, new. 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 Follow-up outpatient high-resolution chest CT suggested in 3-6 months. 2. Mild cardiomegaly. Three-vessel coronary atherosclerosis. 3. Dilated main pulmonary artery, suggesting pulmonary arterial hypertension. 4. Ectatic 4.4 cm ascending thoracic aorta. Recommend annual imaging followup by CTA or MRA. This recommendation follows 2010 ACCF/AHA/AATS/ACR/ASA/SCA/SCAI/SIR/STS/SVM Guidelines for the Diagnosis and Management of Patients with Thoracic Aortic Disease. Circulation. 2010; 121: D782-U235. Aortic aneurysm NOS (ICD10-I71.9). 5. Aortic Atherosclerosis (ICD10-I70.0).  LABORATORY DATA: CBC Latest Ref Rng & Units 04/22/2021 04/20/2021 04/19/2021  WBC 4.0 - 10.5 K/uL 10.0 4.6 5.7  Hemoglobin 13.0 - 17.0 g/dL 10.2(L) 10.6(L) 10.4(L)  Hematocrit 39.0 - 52.0 % 30.8(L) 33.0(L) 31.6(L)  Platelets 150 - 400 K/uL 221 218 228    CMP Latest Ref Rng & Units 04/27/2021 04/23/2021 04/22/2021  Glucose 65 - 99 mg/dL 123(H) 135(H) 163(H)  BUN 8 - 27 mg/dL 26 27(H) 25(H)  Creatinine 0.76 - 1.27 mg/dL 1.70(H) 1.84(H) 1.95(H)  Sodium 134 - 144 mmol/L 147(H) 142 138  Potassium 3.5 - 5.2 mmol/L 3.9 3.4(L) 3.6  Chloride 96 - 106 mmol/L 107(H) 100 101  CO2 20 - 29 mmol/L 28 32 31  Calcium 8.6 - 10.2 mg/dL 9.3 9.3 8.6(L)  Total Protein 6.5 - 8.1 g/dL - - -  Total Bilirubin 0.3 - 1.2 mg/dL - - -  Alkaline Phos 38 - 126 U/L - - -  AST 15 - 41 U/L - - -  ALT 0 - 44 U/L - - -    Lipid Panel     Component Value Date/Time   CHOL 111 04/09/2021 0147   TRIG 53 04/09/2021 0147   HDL 62 04/09/2021 0147   CHOLHDL 1.8 04/09/2021 0147   VLDL 11 04/09/2021 0147   LDLCALC 38 04/09/2021 0147    No components found for: NTPROBNP Recent Labs    12/15/20 0909 01/02/21 0752  01/16/21 0713 02/03/21 0717 03/06/21 0712 03/30/21 0707 04/06/21 0725 04/14/21 0712  PROBNP 4,473* 2,337* 1,789* 733* 1,171* 2,643* 5,653* 3,473*   Recent Labs    11/19/20 0233  TSH 1.247    BMP Recent Labs    04/21/21 0258 04/22/21 0149 04/23/21 0356 04/27/21 0715  NA 140 138 142 147*  K 3.7 3.6 3.4* 3.9  CL 101 101 100 107*  CO2 29 31 32 28  GLUCOSE 212* 163* 135* 123*  BUN 23 25* 27* 26  CREATININE 2.00* 1.95* 1.84* 1.70*  CALCIUM 8.7* 8.6* 9.3 9.3  GFRNONAA 37* 38* 40*  --     HEMOGLOBIN A1C Lab Results  Component Value Date   HGBA1C 5.7 (H) 04/08/2021   MPG 116.89 04/08/2021    External Labs: Collected: 12/08/2020  Creatinine 1.7 mg/dL. (Serum creatinine 1.6 mg/dL collected 07/11/2020) eGFR: 40.9 mL/min per 1.73 m Sodium 143, potassium 3.5, chloride 104, bicarb 32 Hemoglobin 12.6, hematocrit 38.2 NT proBNP 5576  IMPRESSION:    ICD-10-CM   1. Acute heart failure with preserved ejection fraction (HFpEF) (HCC)  I50.31 EKG 12-Lead    2. Dyspnea on exertion  R06.00     3. NSVT (nonsustained ventricular tachycardia) (HCC)  I47.2     4. Essential hypertension  I10     5. Mixed hyperlipidemia  E78.2     6. Hx pulmonary embolism  Z86.711     7. Tobacco abuse  Z72.0 nicotine (NICODERM CQ - DOSED IN MG/24 HR) 7 mg/24hr patch    8. OSA (obstructive sleep apnea)  G47.33     9. Sarcoidosis of lung ([place])  D86.0        RECOMMENDATIONS: DAYMEON FISCHMAN is a 65 y.o. male whose past medical history and cardiac risk factors include: Sarcoidosis, HTN, Tobacco abuse, Hx of PE (was on Xarelto for 1 year), OSA not on CPAP, MSSA Bacteremia (March 2022), Hx of cardiac arrest (2002 during his hospitalization for pancreatitis per wife).  Acute on Chronic heart failure with preserved EF, NYHA class II: Most recent labs from 04/27/2021 independently reviewed at today's visit. Weight remains relatively stable. Medications reconciled. Hospitalization records reviewed as  a part of today's office visit. I have asked the patient to take an additional dose of Bumex if his weight is greater than 3 pounds over the course of 24 hours. I suspect that some of the weight fluctuation is due to the steroids that he is currently getting for pulmonary sarcoidosis. Will need close monitoring to prevent hospitalizations for heart failure.  Dyspnea on exertion: Multifactorial given his lung sarcoidosis, HFpEF, URI symptoms, and tobacco use. Patient has had at least 3 hospitalizations for congestive heart failure/hypoxemic since 2022. Recent CT of the chest noted three-vessel coronary artery calcification, aortic atherosclerosis, and during hospitalization he was noted to have episodes of PVCs and NSVT.  I had a long discussion with both the patient and his wife [name] at today's office visit with regards to considering left and right heart catheterization to evaluate for obstructive CAD and hemodynamics respectively.  After discussing the risks, benefits, and alternatives they would like to proceed with right and left heart catheterization. The procedure of left and right heart catheterization with possible intervention was explained to the patient in detail.  The indication, alternatives, risks and benefits were reviewed.  Complications include but not limited to bleeding, infection, vascular injury, stroke, myocardial infection, arrhythmia, kidney injury leading to short or long-term hemodialysis, requiring temporary or permanent pacemaker,, radiation-related injury in the case of prolonged fluoroscopy use, emergency cardiac surgery, and death. The patient understands the risks of serious complication is 1-2 in 8338 with diagnostic cardiac cath and 1-2% or less with angioplasty/stenting.  The patient and his wife voices understanding and provides verbal feedback and wishes to proceed   Benign essential hypertension: Currently not well controlled. Just started on new doses of  hydralazine/isosorbide. Asked him to give [place] a call in 1 week to see what his blood pressure has been ranging at home. Medications reconciled. Low-salt diet recommended.  Tobacco use: Tobacco cessation counseling: Currently smoking 0.5 packs/day   Patient is willing to quit at this time. Approximately 9 mins were spent counseling patient cessation techniques. We discussed various methods to help quit smoking, including deciding on a date to quit, joining a support  group, pharmacological agents- nicotine gum/patch/lozenges, chantix.  Will prescribe nicotine as this really helped him during his hospitalization I will reassess his progress at the next follow-up visit  FINAL MEDICATION LIST END OF ENCOUNTER: Meds ordered this encounter  Medications   nicotine (NICODERM CQ - DOSED IN MG/24 HR) 7 mg/24hr patch    Sig: Place 1 patch onto the skin daily.    Dispense:  28 patch    Refill:  0     There are no discontinued medications.    Current Outpatient Medications:    aspirin 81 MG tablet, Take 1 tablet (81 mg total) by mouth daily., Disp: , Rfl:    atorvastatin (LIPITOR) 20 MG tablet, Take 20 mg by mouth daily., Disp: , Rfl:    BLACK ELDERBERRY PO, Take 2 each by mouth daily as needed (immune support). Gummies, Disp: , Rfl:    bumetanide (BUMEX) 1 MG tablet, Take 1 tablet (1 mg total) by mouth daily., Disp: 30 tablet, Rfl: 0   dapagliflozin propanediol (FARXIGA) 10 MG TABS tablet, Take 1 tablet (10 mg total) by mouth daily., Disp: 30 tablet, Rfl: 0   diltiazem (CARDIZEM CD) 360 MG 24 hr capsule, Take 360 mg by mouth daily., Disp: , Rfl:    fluticasone (FLONASE) 50 MCG/ACT nasal spray, Place 2 sprays into both nostrils daily as needed for allergies or rhinitis., Disp: , Rfl:    hydrALAZINE (APRESOLINE) 100 MG tablet, Take 1 tablet (100 mg total) by mouth 3 (three) times daily., Disp: 90 tablet, Rfl: 3   isosorbide mononitrate (IMDUR) 30 MG 24 hr tablet, Take 3 tablets (90 mg total) by  mouth daily., Disp: 90 tablet, Rfl: 6   metoprolol succinate (TOPROL-XL) 200 MG 24 hr tablet, Take 1 tablet (200 mg total) by mouth daily. Take with or immediately following a meal., Disp: 30 tablet, Rfl: 0   nicotine (NICODERM CQ - DOSED IN MG/24 HR) 7 mg/24hr patch, Place 1 patch onto the skin daily., Disp: 28 patch, Rfl: 0   pantoprazole (PROTONIX) 40 MG tablet, Take 1 tablet (40 mg total) by mouth daily., Disp: 30 tablet, Rfl: 1   predniSONE (DELTASONE) 10 MG tablet, 40 mg oral daily, decrease by 10 mg every 5 days., Disp: 45 tablet, Rfl: 0   tamsulosin (FLOMAX) 0.4 MG CAPS capsule, Take 0.4 mg by mouth daily., Disp: , Rfl:    traZODone (DESYREL) 150 MG tablet, Take 1 tablet (150 mg total) by mouth at bedtime as needed for sleep., Disp: 90 tablet, Rfl: 3  Orders Placed This Encounter  Procedures   EKG 12-Lead     There are no Patient Instructions on file for this visit.  --Continue cardiac medications as reconciled in final medication list. --Return in about 4 weeks (around 05/28/2021) for Post heart catheterization. Or sooner if needed. --Continue follow-up with your primary care physician regarding the management of your other chronic comorbid conditions.  Patient's questions and concerns were addressed to his satisfaction. He voices understanding of the instructions provided during this encounter.   This note was created using a voice recognition software as a result there may be grammatical errors inadvertently enclosed that do not reflect the nature of this encounter. Every attempt is made to correct such errors.   Rex Kras, [place], [facility]  Pager: [phone] Office: [phone]

## 2021-05-12 NOTE — Interval H&P Note (Signed)
History and Physical Interval Note:  05/12/2021 7:52 AM  [name]  has presented today for surgery, with the diagnosis of shortness of breath  - heart failure.  The various methods of treatment have been discussed with the patient and family. After consideration of risks, benefits and other options for treatment, the patient has consented to  Procedure(s): RIGHT/LEFT HEART CATH AND CORONARY ANGIOGRAPHY (N/A) as a surgical intervention.  The patient's history has been reviewed, patient examined, no change in status, stable for surgery.  I have reviewed the patient's chart and labs.  Questions were answered to the patient's satisfaction.    AUC App not available LHC/RHC for HFpEF  Luiza Carranco J Caelen Reierson

## 2021-05-13 ENCOUNTER — Encounter: Payer: Self-pay | Admitting: Pulmonary Disease

## 2021-05-13 ENCOUNTER — Telehealth: Payer: Self-pay | Admitting: Pulmonary Disease

## 2021-05-13 ENCOUNTER — Other Ambulatory Visit: Payer: Self-pay | Admitting: Cardiology

## 2021-05-13 ENCOUNTER — Telehealth (HOSPITAL_COMMUNITY): Payer: Self-pay | Admitting: Pharmacy Technician

## 2021-05-13 ENCOUNTER — Ambulatory Visit: Payer: PPO | Admitting: Pulmonary Disease

## 2021-05-13 ENCOUNTER — Other Ambulatory Visit (HOSPITAL_COMMUNITY): Payer: Self-pay

## 2021-05-13 VITALS — BP 126/68 | HR 64 | Temp 98.1°F | Ht 71.0 in | Wt 231.0 lb

## 2021-05-13 DIAGNOSIS — D869 Sarcoidosis, unspecified: Secondary | ICD-10-CM

## 2021-05-13 DIAGNOSIS — I5031 Acute diastolic (congestive) heart failure: Secondary | ICD-10-CM

## 2021-05-13 DIAGNOSIS — R918 Other nonspecific abnormal finding of lung field: Secondary | ICD-10-CM

## 2021-05-13 DIAGNOSIS — R9389 Abnormal findings on diagnostic imaging of other specified body structures: Secondary | ICD-10-CM

## 2021-05-13 DIAGNOSIS — Z87891 Personal history of nicotine dependence: Secondary | ICD-10-CM

## 2021-05-13 MED FILL — Heparin Sod (Porcine)-NaCl IV Soln 1000 Unit/500ML-0.9%: INTRAVENOUS | Qty: 500 | Status: AC

## 2021-05-13 NOTE — Telephone Encounter (Signed)
Advanced Heart Failure Patient Advocate Encounter  Patient sent a message that he was not able to pick up [place]. Upon further review, the patient's insurance prefers [place]. Called and spoke with the patient. Advised him to follow up with Dr. Terri Skains since that is his primary cardiologist to get that changed. Spoke with [facility] outpatient pharmacy and asked them to send a message to the office about the change as well.   The grant information is already in [facility] outpatient system, should be $0 after the grant is applied.   Charlann Boxer, CPhT

## 2021-05-13 NOTE — Patient Instructions (Signed)
Thank you for visiting Dr. Valeta Harms at [facility]. Today we recommend the following:  Orders Placed This Encounter  Procedures   Procedural/ Surgical Case Request: VIDEO BRONCHOSCOPY WITH ENDOBRONCHIAL NAVIGATION   NM PET Image Initial (PI) Skull Base To Thigh (F-18 FDG)   CT Super D Chest Wo Contrast   Ambulatory referral to Pulmonology   06/02/2021 planned bronchoscopy  We need PET scan complete prior to the case   Return in about 4 weeks (around 06/10/2021) for with APP.    Please do your part to reduce the spread of COVID-19.

## 2021-05-13 NOTE — Progress Notes (Signed)
Synopsis: Referred in September 2022 for abnormal CT by [name], MD  Subjective:   PATIENT ID: [name] GENDER: male DOB: 05-Dec-1955, MRN: [id number]  Chief Complaint  Patient presents with   Consult    Possible Lung mass    This is a 65 year old gentleman, history of CVA, hypertension, hyperlipidemia, smoker.  Has had a recent cardiac catheterization which showed mild pulmonary hypertension on his right heart cath and coronary artery disease on his left.  This was completed yesterday.  He had a Hospitalization in August.  Has a history of sarcoidosis and COPD.  Due to shortness of breath he had a CT scan of the chest which revealed mediastinal adenopathy as well as a 3.7 x 2.4 cm anterior right lower lobe peripheral based lesion.  Patient was referred today for evaluation of abnormal CT imaging.  From respiratory standpoint he is doing well with no complaints today.   Past Medical History:  Diagnosis Date   Back pain    CKD (chronic kidney disease), stage III (HCC)    CVA (cerebral infarction)    Diastolic dysfunction    ED (erectile dysfunction)    GERD (gastroesophageal reflux disease)    Hypercalcemia    Hyperlipidemia    Hypertension    Insomnia    Long-term use of high-risk medication    Nephrocalcinosis    Nephrolithiasis    Obesity    Osteopenia    Pancreatitis    PE (pulmonary embolism)    Proteinuria    Sarcoidosis    Smoker      Family History  Problem Relation Age of Onset   Hypertension Father    CVA Father    Lung cancer Father    Alzheimer's disease Mother    Hypertension Sister    Heart failure Sister      Past Surgical History:  Procedure Laterality Date   ABDOMINAL EXPLORATION SURGERY     BACK SURGERY     CHOLECYSTECTOMY     RIGHT/LEFT HEART CATH AND CORONARY ANGIOGRAPHY N/A 05/12/2021   Procedure: RIGHT/LEFT HEART CATH AND CORONARY ANGIOGRAPHY;  Surgeon: [name], MD;  Location: [facility];  Service:  Cardiovascular;  Laterality: N/A;   SHOULDER ARTHROSCOPY Right 10/30/2020   Procedure: ARTHROSCOPY SHOULDER WITH EXTENSIVE DEBRIDEMENT;  Surgeon: Mcarthur Rossetti, MD;  Location: [facility];  Service: Orthopedics;  Laterality: Right;   SHOULDER SURGERY     TRACHEOSTOMY     closed    Social History   Socioeconomic History   Marital status: Married    Spouse name: Building services engineer   Number of children: 2   Years of education: Not on file   Highest education level: Associate degree: academic program  Occupational History   Not on file  Tobacco Use   Smoking status: Former    Packs/day: 0.25    Years: 15.00    Pack years: 3.75    Types: Cigarettes    Start date: 09/06/2005    Quit date: 02/23/2021    Years since quitting: 0.2   Smokeless tobacco: Never  Vaping Use   Vaping Use: Never used  Substance and Sexual Activity   Alcohol use: No    Alcohol/week: 0.0 standard drinks   Drug use: No   Sexual activity: Yes  Other Topics Concern   Not on file  Social History Narrative   Not on file   Social Determinants of Health   Financial Resource Strain: Low Risk  Difficulty of Paying Living Expenses: Not very hard  Food Insecurity: Not on file  Transportation Needs: No Transportation Needs   Lack of Transportation (Medical): No   Lack of Transportation (Non-Medical): No  Physical Activity: Not on file  Stress: Not on file  Social Connections: Not on file  Intimate Partner Violence: Not on file     Allergies  Allergen Reactions   Codeine Nausea Only   Hydrochlorothiazide     Unknown   Hydromorphone Other (See Comments)    hallucinations      Outpatient Medications Prior to Visit  Medication Sig Dispense Refill   aspirin 81 MG tablet Take 1 tablet (81 mg total) by mouth daily.     atorvastatin (LIPITOR) 20 MG tablet Take 20 mg by mouth daily.     BLACK ELDERBERRY PO Take 2 each by mouth daily as needed (immune support). Gummies     bumetanide  (BUMEX) 1 MG tablet Take 1 tablet (1 mg total) by mouth daily. 30 tablet 0   dapagliflozin propanediol (FARXIGA) 10 MG TABS tablet Take 1 tablet (10 mg total) by mouth daily. 90 tablet 0   diltiazem (CARDIZEM CD) 360 MG 24 hr capsule Take 360 mg by mouth daily.     fluticasone (FLONASE) 50 MCG/ACT nasal spray Place 2 sprays into both nostrils daily as needed for allergies or rhinitis.     hydrALAZINE (APRESOLINE) 100 MG tablet Take 1 tablet (100 mg total) by mouth 3 (three) times daily. 90 tablet 3   isosorbide mononitrate (IMDUR) 30 MG 24 hr tablet Take 3 tablets (90 mg total) by mouth daily. 90 tablet 6   metoprolol succinate (TOPROL-XL) 200 MG 24 hr tablet Take 1 tablet (200 mg total) by mouth daily. Take with or immediately following a meal. 30 tablet 0   nicotine (NICODERM CQ - DOSED IN MG/24 HR) 7 mg/24hr patch Place 1 patch onto the skin daily. 28 patch 0   pantoprazole (PROTONIX) 40 MG tablet Take 1 tablet (40 mg total) by mouth daily. 30 tablet 1   predniSONE (DELTASONE) 10 MG tablet 40 mg oral daily, decrease by 10 mg every 5 days. 45 tablet 0   sacubitril-valsartan (ENTRESTO) 97-103 MG Take 0.5 tablets by mouth 2 (two) times daily. 30 tablet 0   tamsulosin (FLOMAX) 0.4 MG CAPS capsule Take 0.4 mg by mouth daily.     traZODone (DESYREL) 150 MG tablet Take 1 tablet (150 mg total) by mouth at bedtime as needed for sleep. 90 tablet 3   No facility-administered medications prior to visit.    Review of Systems  Constitutional:  Negative for chills, fever, malaise/fatigue and weight loss.  HENT:  Negative for hearing loss, sore throat and tinnitus.   Eyes:  Negative for blurred vision and double vision.  Respiratory:  Negative for cough, hemoptysis, sputum production, shortness of breath, wheezing and stridor.   Cardiovascular:  Negative for chest pain, palpitations, orthopnea, leg swelling and PND.  Gastrointestinal:  Negative for abdominal pain, constipation, diarrhea, heartburn, nausea  and vomiting.  Genitourinary:  Negative for dysuria, hematuria and urgency.  Musculoskeletal:  Negative for joint pain and myalgias.  Skin:  Negative for itching and rash.  Neurological:  Negative for dizziness, tingling, weakness and headaches.  Endo/Heme/Allergies:  Negative for environmental allergies. Does not bruise/bleed easily.  Psychiatric/Behavioral:  Negative for depression. The patient is not nervous/anxious and does not have insomnia.   All other systems reviewed and are negative.   Objective:  Physical Exam Vitals reviewed.  Constitutional:      General: He is not in acute distress.    Appearance: He is well-developed. He is obese.  HENT:     Head: Normocephalic and atraumatic.  Eyes:     General: No scleral icterus.    Conjunctiva/sclera: Conjunctivae normal.     Pupils: Pupils are equal, round, and reactive to light.  Neck:     Vascular: No JVD.     Trachea: No tracheal deviation.  Cardiovascular:     Rate and Rhythm: Normal rate and regular rhythm.     Heart sounds: Normal heart sounds. No murmur heard. Pulmonary:     Effort: Pulmonary effort is normal. No tachypnea, accessory muscle usage or respiratory distress.     Breath sounds: Normal breath sounds. No stridor. No wheezing, rhonchi or rales.  Abdominal:     General: Bowel sounds are normal. There is no distension.     Palpations: Abdomen is soft.     Tenderness: There is no abdominal tenderness.  Musculoskeletal:        General: No tenderness.     Cervical back: Neck supple.     Comments: Right wrist with bandage from recent right heart catheterization.  Lymphadenopathy:     Cervical: No cervical adenopathy.  Skin:    General: Skin is warm and dry.     Capillary Refill: Capillary refill takes less than 2 seconds.     Findings: No rash.  Neurological:     Mental Status: He is alert and oriented to person, place, and time.  Psychiatric:        Behavior: Behavior normal.     Vitals:   05/13/21  1053  BP: 126/68  Pulse: 64  Temp: 98.1 F (36.7 C)  TempSrc: Oral  SpO2: 96%  Weight: 231 lb (104.8 kg)  Height: '5\' 11"'$  (1.803 m)   96% on RA BMI Readings from Last 3 Encounters:  05/13/21 32.22 kg/m  05/12/21 31.52 kg/m  04/30/21 31.06 kg/m   Wt Readings from Last 3 Encounters:  05/13/21 231 lb (104.8 kg)  05/12/21 226 lb (102.5 kg)  04/30/21 229 lb (103.9 kg)     CBC    Component Value Date/Time   WBC 10.0 04/22/2021 0149   RBC 3.40 (L) 04/22/2021 0149   HGB 11.6 (L) 05/12/2021 0811   HCT 34.0 (L) 05/12/2021 0811   PLT 221 04/22/2021 0149   MCV 90.6 04/22/2021 0149   MCV 89.4 11/02/2015 1003   MCH 30.0 04/22/2021 0149   MCHC 33.1 04/22/2021 0149   RDW 15.7 (H) 04/22/2021 0149   LYMPHSABS 0.5 (L) 04/17/2021 1930   MONOABS 0.5 04/17/2021 1930   EOSABS 0.4 04/17/2021 1930   BASOSABS 0.0 04/17/2021 1930    Chest Imaging: 04/21/2021: CT scan of the chest. 3.7 x 2.4 cm right lower lobe peripheral based lesion, areas of patchy nodularity and peribronchovascular thickening with his history of sarcoid. The patient's images have been independently reviewed by me.    Pulmonary Functions Testing Results: No flowsheet data found.  FeNO: no  Pathology: no  Echocardiogram: no  Heart Catheterization:   05/12/2021: LM: Normal LAD: Ectatic vessel         Diag 1 ostial 50% stenosis LCX: Ectatic vessel         Ostial Lcx 30% stenosis RCA: Ectatic vessel, tapers rapidly distally         Large RV marginal branch supplies more territory than distal RCA  Distal RCA focal 70% stenosis          Borderline pulmonary hypertension (mPAP 20 mmHg) Most likely WHO GRP III Normal PCW 9 mmHg   In absence of angina symptoms, recommend continued medical management. If he has angina symptoms in future, could consider PCI to distal RCA.    Assessment & Plan:     ICD-10-CM   1. Right lower lobe lung mass  R91.8 NM PET Image Initial (PI) Skull Base To Thigh (F-18 FDG)     Procedural/ Surgical Case Request: VIDEO BRONCHOSCOPY WITH ENDOBRONCHIAL NAVIGATION    Ambulatory referral to Pulmonology    CT Super D Chest Wo Contrast    2. Sarcoidosis  D86.9 NM PET Image Initial (PI) Skull Base To Thigh (F-18 FDG)    Procedural/ Surgical Case Request: VIDEO BRONCHOSCOPY WITH ENDOBRONCHIAL NAVIGATION    Ambulatory referral to Pulmonology    CT Super D Chest Wo Contrast    3. Abnormal CT of the chest  R93.89     4. Former smoker  Z87.891       Discussion:  This is a 65 year old gentleman, recent CT scan of the chest with lower lobe peripheral based 3 cm mass.  Has a history of sarcoidosis by imaging.  No previous lung biopsy in his history.  He was recently hospitalized for evaluation.  He is a smoker for the past 15 years, quit 2 weeks ago.  Plan: Due to the patient's history of smoking as well as the finding of the new peripheral based 3 cm mass there is concern for underlying malignancy. He also has a imaging diagnosis concerned of sarcoidosis. He has other peribronchovascular nodular infiltrates present as well. We discussed all of the images today in the office. I think next best step would be to obtain a nuclear medicine PET scan. Either way due to the size of the lesion we need to consider tissue sampling and biopsy. Patient is agreeable to proceed with navigational bronchoscopy. We discussed the risk benefits alternatives of bronchoscopy. We will plan for this a few weeks from now which can be done after the PET scan. I would like to have the PET scan to review prior to the bronchoscopy. Patient is agreeable to this.  Tentative bronchoscopy date on 06/02/2021.   Current Outpatient Medications:    aspirin 81 MG tablet, Take 1 tablet (81 mg total) by mouth daily., Disp: , Rfl:    atorvastatin (LIPITOR) 20 MG tablet, Take 20 mg by mouth daily., Disp: , Rfl:    BLACK ELDERBERRY PO, Take 2 each by mouth daily as needed (immune support). Gummies, Disp: ,  Rfl:    bumetanide (BUMEX) 1 MG tablet, Take 1 tablet (1 mg total) by mouth daily., Disp: 30 tablet, Rfl: 0   dapagliflozin propanediol (FARXIGA) 10 MG TABS tablet, Take 1 tablet (10 mg total) by mouth daily., Disp: 90 tablet, Rfl: 0   diltiazem (CARDIZEM CD) 360 MG 24 hr capsule, Take 360 mg by mouth daily., Disp: , Rfl:    fluticasone (FLONASE) 50 MCG/ACT nasal spray, Place 2 sprays into both nostrils daily as needed for allergies or rhinitis., Disp: , Rfl:    hydrALAZINE (APRESOLINE) 100 MG tablet, Take 1 tablet (100 mg total) by mouth 3 (three) times daily., Disp: 90 tablet, Rfl: 3   isosorbide mononitrate (IMDUR) 30 MG 24 hr tablet, Take 3 tablets (90 mg total) by mouth daily., Disp: 90 tablet, Rfl: 6   metoprolol succinate (TOPROL-XL) 200 MG 24 hr tablet,  Take 1 tablet (200 mg total) by mouth daily. Take with or immediately following a meal., Disp: 30 tablet, Rfl: 0   nicotine (NICODERM CQ - DOSED IN MG/24 HR) 7 mg/24hr patch, Place 1 patch onto the skin daily., Disp: 28 patch, Rfl: 0   pantoprazole (PROTONIX) 40 MG tablet, Take 1 tablet (40 mg total) by mouth daily., Disp: 30 tablet, Rfl: 1   predniSONE (DELTASONE) 10 MG tablet, 40 mg oral daily, decrease by 10 mg every 5 days., Disp: 45 tablet, Rfl: 0   sacubitril-valsartan (ENTRESTO) 97-103 MG, Take 0.5 tablets by mouth 2 (two) times daily., Disp: 30 tablet, Rfl: 0   tamsulosin (FLOMAX) 0.4 MG CAPS capsule, Take 0.4 mg by mouth daily., Disp: , Rfl:    traZODone (DESYREL) 150 MG tablet, Take 1 tablet (150 mg total) by mouth at bedtime as needed for sleep., Disp: 90 tablet, Rfl: 3  I spent 63 minutes dedicated to the care of this patient on the date of this encounter to include pre-visit review of records, face-to-face time with the patient discussing conditions above, post visit ordering of testing, clinical documentation with the electronic health record, making appropriate referrals as documented, and communicating necessary findings to  members of the patients care team.   Garner Nash, [facility] 05/13/2021 5:55 PM

## 2021-05-14 ENCOUNTER — Other Ambulatory Visit (HOSPITAL_COMMUNITY): Payer: Self-pay

## 2021-05-15 ENCOUNTER — Other Ambulatory Visit (HOSPITAL_COMMUNITY): Payer: Self-pay

## 2021-05-15 ENCOUNTER — Other Ambulatory Visit: Payer: Self-pay

## 2021-05-15 DIAGNOSIS — I5031 Acute diastolic (congestive) heart failure: Secondary | ICD-10-CM

## 2021-05-15 MED ORDER — DAPAGLIFLOZIN PROPANEDIOL 10 MG PO TABS
10.0000 mg | ORAL_TABLET | Freq: Every day | ORAL | 3 refills | Status: DC
  Filled 2021-05-15 – 2021-08-12 (×3): qty 90, 90d supply, fill #0
  Filled 2021-11-07: qty 90, 90d supply, fill #1
  Filled 2022-02-15: qty 30, 30d supply, fill #2
  Filled 2022-03-11: qty 30, 30d supply, fill #3
  Filled 2022-04-16: qty 30, 30d supply, fill #4

## 2021-05-18 ENCOUNTER — Ambulatory Visit: Payer: PPO | Admitting: Cardiology

## 2021-05-19 ENCOUNTER — Other Ambulatory Visit (HOSPITAL_COMMUNITY): Payer: Self-pay

## 2021-05-19 MED ORDER — KAPSPARGO SPRINKLE 200 MG PO CS24
EXTENDED_RELEASE_CAPSULE | ORAL | 3 refills | Status: DC
  Filled 2021-05-19: qty 90, 90d supply, fill #0

## 2021-05-19 MED ORDER — ATORVASTATIN CALCIUM 20 MG PO TABS
ORAL_TABLET | ORAL | 3 refills | Status: DC
  Filled 2021-05-19 – 2021-05-26 (×2): qty 90, 90d supply, fill #0
  Filled 2021-08-15: qty 90, 90d supply, fill #1

## 2021-05-20 ENCOUNTER — Other Ambulatory Visit (HOSPITAL_COMMUNITY): Payer: Self-pay

## 2021-05-21 ENCOUNTER — Other Ambulatory Visit (HOSPITAL_COMMUNITY): Payer: Self-pay

## 2021-05-21 MED ORDER — METOPROLOL SUCCINATE ER 200 MG PO TB24
ORAL_TABLET | ORAL | 0 refills | Status: DC
  Filled 2021-05-21: qty 30, 30d supply, fill #0

## 2021-05-22 ENCOUNTER — Other Ambulatory Visit (HOSPITAL_COMMUNITY): Payer: Self-pay

## 2021-05-25 ENCOUNTER — Other Ambulatory Visit (HOSPITAL_COMMUNITY): Payer: Self-pay

## 2021-05-25 MED ORDER — METOPROLOL SUCCINATE ER 200 MG PO TB24
ORAL_TABLET | ORAL | 3 refills | Status: DC
  Filled 2021-05-25 – 2021-06-22 (×2): qty 90, 90d supply, fill #0
  Filled 2021-09-12: qty 90, 90d supply, fill #1
  Filled 2021-12-13: qty 90, 90d supply, fill #2
  Filled 2022-03-27: qty 90, 90d supply, fill #3

## 2021-05-26 ENCOUNTER — Other Ambulatory Visit (HOSPITAL_COMMUNITY): Payer: Self-pay

## 2021-05-27 ENCOUNTER — Encounter (HOSPITAL_COMMUNITY): Payer: Self-pay | Admitting: Pulmonary Disease

## 2021-05-27 ENCOUNTER — Other Ambulatory Visit: Payer: Self-pay

## 2021-05-27 ENCOUNTER — Ambulatory Visit (HOSPITAL_COMMUNITY): Payer: PPO

## 2021-05-27 ENCOUNTER — Encounter (HOSPITAL_COMMUNITY)
Admission: RE | Admit: 2021-05-27 | Discharge: 2021-05-27 | Disposition: A | Discharge status: Home / Self Care | Payer: PPO | Source: Ambulatory Visit | Attending: Pulmonary Disease | Admitting: Pulmonary Disease

## 2021-05-27 DIAGNOSIS — I2510 Atherosclerotic heart disease of native coronary artery without angina pectoris: Secondary | ICD-10-CM | POA: Diagnosis not present

## 2021-05-27 DIAGNOSIS — D869 Sarcoidosis, unspecified: Secondary | ICD-10-CM | POA: Diagnosis not present

## 2021-05-27 DIAGNOSIS — I517 Cardiomegaly: Secondary | ICD-10-CM | POA: Diagnosis not present

## 2021-05-27 DIAGNOSIS — K5730 Diverticulosis of large intestine without perforation or abscess without bleeding: Secondary | ICD-10-CM | POA: Diagnosis not present

## 2021-05-27 DIAGNOSIS — I700 Atherosclerosis of aorta: Secondary | ICD-10-CM | POA: Diagnosis not present

## 2021-05-27 DIAGNOSIS — R918 Other nonspecific abnormal finding of lung field: Secondary | ICD-10-CM | POA: Diagnosis not present

## 2021-05-27 LAB — GLUCOSE, CAPILLARY: Glucose-Capillary: 143 mg/dL — ABNORMAL HIGH (ref 70–99)

## 2021-05-27 MED ORDER — FLUDEOXYGLUCOSE F - 18 (FDG) INJECTION
11.6000 | Freq: Once | INTRAVENOUS | Status: AC | PRN
  Administered 2021-05-27: 11.42 via INTRAVENOUS

## 2021-05-27 NOTE — Progress Notes (Signed)
Spoke with pt. Given pre-op instructions.  Instructed to hold farxigo 06/01/2021  none on 06/02/2021

## 2021-05-28 ENCOUNTER — Encounter (HOSPITAL_COMMUNITY): Payer: Self-pay | Admitting: Pulmonary Disease

## 2021-05-28 ENCOUNTER — Encounter (HOSPITAL_COMMUNITY): Payer: Self-pay | Admitting: Vascular Surgery

## 2021-05-28 NOTE — Anesthesia Preprocedure Evaluation (Deleted)
Anesthesia Evaluation    Airway        Dental   Pulmonary Patient abstained from smoking., former smoker,           Cardiovascular hypertension,   RHC/LHC 05/12/21 Virgina Jock, Manesh, MD): LM: Normal LAD: Ectatic vessel Diag 1 ostial 50% stenosis LCX: Ectatic vessel Ostial Lcx 30% stenosis RCA: Ectatic vessel, tapers rapidly distally Large RV marginal branch supplies more territory than distal RCA  Distal RCA focal 70% stenosis  Borderline pulmonary hypertension (mPAP 20 mmHg) Most likely WHO GRP III Normal PCW 9 mmHg  In absence of angina symptoms, recommend continued medical management. If he has angina symptoms in future, could consider PCI to distal RCA.    Echo 03/23/21: IMPRESSIONS  1. Left ventricular ejection fraction, by estimation, is 60 to 65%. The  left ventricle has normal function. The left ventricle has no regional  wall motion abnormalities. There is mild left ventricular hypertrophy.  Left ventricular diastolic parameters  are consistent with Grade I diastolic dysfunction (impaired relaxation).  2. Right ventricular systolic function is normal. The right ventricular  size is normal. There is moderately elevated pulmonary artery systolic  pressure. The estimated right ventricular systolic pressure is 16.1 mmHg.  3. Left atrial size was severely dilated.  4. The mitral valve is normal in structure. Mild mitral valve  regurgitation. No evidence of mitral stenosis.  5. The aortic valve is calcified. Aortic valve regurgitation is mild. No  aortic stenosis is present.  6. The inferior vena cava is dilated in size with <50% respiratory  variability, suggesting right atrial pressure of 15 mmHg.     Neuro/Psych    GI/Hepatic   Endo/Other    Renal/GU      Musculoskeletal   Abdominal   Peds  Hematology   Anesthesia Other Findings    Reproductive/Obstetrics                             Anesthesia Physical Anesthesia Plan  ASA:   Anesthesia Plan:    Post-op Pain Management:    Induction:   PONV Risk Score and Plan:   Airway Management Planned:   Additional Equipment:   Intra-op Plan:   Post-operative Plan:   Informed Consent:   Plan Discussed with:   Anesthesia Plan Comments: (PAT note written 05/28/2021 by Myra Gianotti, PA-C. )        Anesthesia Quick Evaluation

## 2021-05-28 NOTE — Progress Notes (Signed)
Anesthesia Chart Review: SAME DAY WORK-UP  Case: [id number] Date/Time: 06/02/21 1100   Procedure: VIDEO BRONCHOSCOPY WITH ENDOBRONCHIAL NAVIGATION (Right) - ION   Anesthesia type: General   Diagnosis:      Right lower lobe lung mass [R91.8]     Sarcoidosis [D86.9]   Pre-op diagnosis: lung mass   Location: [facility] / [facility]   Surgeons: Garner Nash, DO       DISCUSSION: Patient is a 65 year old male scheduled for the above procedure.   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), PE (01/30/13, s/p one year Xarelto), CVA (clinical CVA with negative MRI 11/2012), GERD, CKD (stage III), hypercalcemia (hospitalized 02/2007), nephrolithiasis, OSA, exertional dyspnea, gallstone pancreatitis (complicated by sepsis, ARF s/p CVVHD, VDRF s/p tracheostomy, bradycardia/asystolic event due to mucous plugging, underwent cholecystectomy 05/26/01; admitted 03/19/01-06/05/01; s/p I&D, LOA for recurrent retroperitoneal abscess), spinal surgery (L4-5 fusion 2011; L3-4 ALIF 09/12/17; revision L3-5 PSF 02/28/19), right shoulder rotator cuff repair 10/30/20, MSSA bacteremia (11/2020, s/p 4 weeks IV cefazolin), COVID-19 (10/2020).   Last cardiology visit with Dr. Terri Skains 04/30/21. A RHC;LHC was recommended given exertional dyspnea, at least 3 hospitalizations for CHF/hypoxemia since 2022 (last 04/17/21-04/23/21), and 3V coronary calcifications on imaging. 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 Medical therapy recommended, but could consider PCI of RCA should he become symptomatic.   He is a same day work-up, so labs as indicated and anesthesia team evaluation on the day of surgery. 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    VS:  BP Readings from Last 3 Encounters:  05/13/21 126/68  05/12/21 112/67  04/30/21 (!) 152/96   Pulse Readings from Last 3 Encounters:  05/13/21 64  05/12/21 (!) 55  04/30/21 68     PROVIDERS: [name], MD is PCP is PCP  Rex Kras, MD is cardiologist [facility]) Dohmeier, Asencion Partridge, MD is neurologist ([facility]) Michel Bickers, MD is ID   LABS: For day of surgery as indicated. Currently, last results include: Lab Results  Component Value Date   WBC 10.0 04/22/2021   HGB 11.6 (L) 05/12/2021   HCT 34.0 (L) 05/12/2021   PLT 221 04/22/2021   GLUCOSE 122 (H) 05/06/2021   ALT 20 04/19/2021   AST 19 04/19/2021   NA 146 (H) 05/12/2021   K 3.5 05/12/2021   CL 105 05/06/2021   CREATININE 1.53 (H) 05/06/2021   BUN 21 05/06/2021   CO2 28 05/06/2021   TSH 1.247 11/19/2020   INR 1.2 11/18/2020   HGBA1C 5.7 (H) 04/08/2021    OTHER: POLYSOMNOGRAPHY 04/29/21: IMPRESSION :  1. Obstructive Sleep Apnea (OSA) with a REM dependent apnea form and prolonged hypoxia.  2.  Dysfunctions associated with sleep stages or arousals from sleep until CPAP was initiated.  3.  Abnormal EKG CPAP without EPR at 10 cm water was able to control the apnea and improve the patient's hypoxia. No added oxygen was needed. The patient does best in reclined position, sleeping with the chest and head elevated.  RECOMMENDATIONS:  CPAP to be set 6-11cm water, no EPR and the patient is to elevate the head of bed or use a wedge. FFM of  his choice was a wide F30 i.    Home Sleep Test 12/10/19: Summary:  Mild sleep apnea at AHI 15.5/h and strongly REM sleep dependent. REM AHI rose to 37.0/h.  Borderline tacky-bradycardia.  No positional dependent apnea.  There were also 34 minutes total hypoxemia time noted, about 8% of the recorded sleep time.  Snoring level was not high. Recommendations:  Strongly REM dependent sleep apnea will require positive airway pressure  therapy.  This patient would also be at high risk for central sleep apnea, which this HST did not find present. CPAP ordered.    IMAGES: PET Scan 05/27/21: IMPRESSION: 1. Interval resolution of the previously visualized mass like consolidation in the anterior right lower lobe. 2. Interval decrease in the widespread patchy nodular thickening of the peribronchovascular interstitium and associated patchy peribronchovascular ground-glass opacities and nodularity. Similar to slightly decreased size of the calcified and noncalcified mediastinal and hilar lymph nodes. Constellation of findings again likely reflecting sequela of pulmonary sarcoid.  CT Chest 04/21/21: IMPRESSION: 1. Widespread patchy nodular thickening of the peribronchovascular interstitium throughout both lungs, most prominent in the upper lobes, with associated patchy peribronchovascular ground-glass opacity. Masslike 3.7 cm focus of consolidation in the anterior right lower lobe, new. 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 Follow-up outpatient high-resolution chest CT suggested in 3-6 months. 2. Mild cardiomegaly. Three-vessel coronary atherosclerosis. 3. Dilated main pulmonary artery, suggesting pulmonary arterial hypertension. 4. Ectatic 4.4 cm ascending thoracic aorta. Recommend annual imaging followup by CTA or MRA. This recommendation follows 2010 ACCF/AHA/AATS/ACR/ASA/SCA/SCAI/SIR/STS/SVM Guidelines for the Diagnosis and Management of Patients with Thoracic Aortic Disease. Circulation. 2010; 121: Y865-H846. Aortic aneurysm NOS (ICD10-I71.9). 5. Aortic Atherosclerosis (ICD10-I70.0).  MRI Head 01/02/21 ([facility]): IMPRESSION:  1.  Enhancement along with mild thickening of the infundibulum, nonspecific but can be seen with hypophysitis or from sarcoidosis.   MR Venogram Head 11/19/20: IMPRESSION: Normal intracranial MRV. No evidence for dural sinus  thrombosis.  CTA head/neck 11/18/20: IMPRESSION: CTA neck: 1. The bilateral common carotid, internal carotid and vertebral arteries are patent within the neck without stenosis. 2. Interlobular septal thickening within the imaged lung apices, nonspecific but possibly reflecting interstitial edema. Additionally, there are partially imaged layering bilateral pleural effusions. 3. 7 mm right upper lobe pulmonary nodule. Non-contrast chest CT at 6-12 months is recommended. If the nodule is stable at time of repeat CT, then future CT at 18-24 months (from today's scan) is considered optional for low-risk patients, but is recommended for high-risk patients. This recommendation follows the consensus statement: Guidelines for Management of Incidental Pulmonary Nodules Detected on CT Images: From the Fleischner Society 2017; Radiology 2017; 284:228-243. CTA head: 1. No intracranial large vessel occlusion or proximal high-grade arterial stenosis. 2. Asymmetric prominence and enhancement of the left superior ophthalmic vein, new from the prior CTA head/neck of 03/14/2019. No associated asymmetry of the cavernous sinuses is appreciated and this finding is indeterminate in etiology.   EKG: EKG 04/30/2021: Normal sinus rhythm, 68 bpm, left atrial enlargement, nonspecific ST-T changes, without underlying injury pattern.   CV: RHC/LHC 05/12/21 Virgina Jock, Manesh, MD): LM: Normal LAD: Ectatic vessel         Diag 1 ostial 50% stenosis LCX: Ectatic vessel         Ostial Lcx 30% stenosis RCA: Ectatic vessel, tapers rapidly distally         Large RV marginal branch supplies more territory than distal RCA           Distal RCA focal  70% stenosis          Borderline pulmonary hypertension (mPAP 20 mmHg) Most likely WHO GRP III Normal PCW 9 mmHg   In absence of angina symptoms, recommend continued medical management. If he has angina symptoms in future, could consider PCI to distal RCA.      Echo 03/23/21: IMPRESSIONS   1. Left ventricular ejection fraction, by estimation, is 60 to 65%. The  left ventricle has normal function. The left ventricle has no regional  wall motion abnormalities. There is mild left ventricular hypertrophy.  Left ventricular diastolic parameters  are consistent with Grade I diastolic dysfunction (impaired relaxation).   2. Right ventricular systolic function is normal. The right ventricular  size is normal. There is moderately elevated pulmonary artery systolic  pressure. The estimated right ventricular systolic pressure is 16.1 mmHg.   3. Left atrial size was severely dilated.   4. The mitral valve is normal in structure. Mild mitral valve  regurgitation. No evidence of mitral stenosis.   5. The aortic valve is calcified. Aortic valve regurgitation is mild. No  aortic stenosis is present.   6. The inferior vena cava is dilated in size with <50% respiratory  variability, suggesting right atrial pressure of 15 mmHg.    BLE Venous US 12/10/20: IMPRESSION: No evidence of deep venous thrombosis in either lower extremity.    Past Medical History:  Diagnosis Date   Back pain    CHF (congestive heart failure) (HCC)    CKD (chronic kidney disease), stage III (HCC)    CVA (cerebral infarction)    Diastolic dysfunction    ED (erectile dysfunction)    GERD (gastroesophageal reflux disease)    Hypercalcemia    Hyperlipidemia    Hypertension    Insomnia    Long-term use of high-risk medication    Nephrocalcinosis    Nephrolithiasis    Obesity    Osteopenia    Pancreatitis    PE (pulmonary embolism)    Proteinuria    Sarcoidosis    Sleep apnea    Smoker    Stroke [facility])     Past Surgical History:  Procedure Laterality Date   ABDOMINAL EXPLORATION SURGERY     BACK SURGERY     CHOLECYSTECTOMY     RIGHT/LEFT HEART CATH AND CORONARY ANGIOGRAPHY N/A 05/12/2021   Procedure: RIGHT/LEFT HEART CATH AND CORONARY ANGIOGRAPHY;  Surgeon: [name], MD;  Location: [place] [facility];  Service: Cardiovascular;  Laterality: N/A;   SHOULDER ARTHROSCOPY Right 10/30/2020   Procedure: ARTHROSCOPY SHOULDER WITH EXTENSIVE DEBRIDEMENT;  Surgeon: Mcarthur Rossetti, MD;  Location: [place];  Service: Orthopedics;  Laterality: Right;   SHOULDER SURGERY     TRACHEOSTOMY     closed    MEDICATIONS: No current facility-administered medications for this encounter.    aspirin 81 MG tablet   atorvastatin (LIPITOR) 20 MG tablet   BLACK ELDERBERRY PO   bumetanide (BUMEX) 1 MG tablet   dapagliflozin propanediol (FARXIGA) 10 MG TABS tablet   diltiazem (CARDIZEM CD) 360 MG 24 hr capsule   fluticasone (FLONASE) 50 MCG/ACT nasal spray   hydrALAZINE (APRESOLINE) 100 MG tablet   isosorbide mononitrate (IMDUR) 30 MG 24 hr tablet   metoprolol (TOPROL-XL) 200 MG 24 hr tablet   nicotine (NICODERM CQ - DOSED IN MG/24 HR) 7 mg/24hr patch   pantoprazole (PROTONIX) 40 MG tablet   predniSONE (DELTASONE) 10 MG tablet   sacubitril-valsartan (ENTRESTO) 97-103 MG   tamsulosin (FLOMAX) 0.4  MG CAPS capsule   traZODone (DESYREL) 150 MG tablet   Metoprolol Succinate (KAPSPARGO SPRINKLE) 200 MG CS24   metoprolol succinate (TOPROL-XL) 200 MG 24 hr tablet    Myra Gianotti, PA-C Surgical Short Stay/Anesthesiology [facility] Phone [phone] [facility] Phone [phone] 05/28/2021 2:22 PM

## 2021-05-29 ENCOUNTER — Other Ambulatory Visit (HOSPITAL_COMMUNITY): Payer: Self-pay

## 2021-05-29 ENCOUNTER — Telehealth: Payer: Self-pay | Admitting: Pulmonary Disease

## 2021-05-29 NOTE — Telephone Encounter (Signed)
I called and spoke with patient regarding message on PET scan. Patient stated PET scan showed nothing and wanted to know if Bronch was still needed on Tuesday. I checked and see Dr. Valeta Harms has not reviewed scan yet so informed patient that I will route to him to see what he recs. Patient verbalized understanding.  Dr. Valeta Harms, please advise on PET and bronch on Tuesday. Thanks!

## 2021-06-01 ENCOUNTER — Ambulatory Visit: Payer: PPO | Admitting: Cardiology

## 2021-06-01 ENCOUNTER — Telehealth: Payer: Self-pay | Admitting: Pulmonary Disease

## 2021-06-01 DIAGNOSIS — I5031 Acute diastolic (congestive) heart failure: Secondary | ICD-10-CM | POA: Diagnosis not present

## 2021-06-01 NOTE — Telephone Encounter (Signed)
I called and spoke with patient regarding bronch. Patient verbalized understanding, nothing further needed.

## 2021-06-01 NOTE — Telephone Encounter (Signed)
I have cancelled the procedure will call the patient and make them aware  Left detailed message for the patient

## 2021-06-01 NOTE — Telephone Encounter (Signed)
Called and spoke with patient who is calling about his PET scan results. Advised him that I was going to send Dr. Valeta Harms a message and we would get back to him once we heard back from him. Patient expressed understanding.  Dr. Valeta Harms please advise

## 2021-06-02 ENCOUNTER — Ambulatory Visit (HOSPITAL_COMMUNITY): Admission: RE | Admit: 2021-06-02 | Payer: PPO | Source: Home / Self Care | Admitting: Pulmonary Disease

## 2021-06-02 DIAGNOSIS — R918 Other nonspecific abnormal finding of lung field: Secondary | ICD-10-CM | POA: Insufficient documentation

## 2021-06-02 DIAGNOSIS — D869 Sarcoidosis, unspecified: Secondary | ICD-10-CM | POA: Insufficient documentation

## 2021-06-02 HISTORY — DX: Heart failure, unspecified: I50.9

## 2021-06-02 HISTORY — DX: Sleep apnea, unspecified: G47.30

## 2021-06-02 HISTORY — DX: Thoracic aortic ectasia: I77.810

## 2021-06-02 HISTORY — DX: Cerebral infarction, unspecified: I63.9

## 2021-06-02 LAB — MAGNESIUM: Magnesium: 2.1 mg/dL (ref 1.6–2.3)

## 2021-06-02 LAB — BASIC METABOLIC PANEL
BUN/Creatinine Ratio: 10 (ref 10–24)
BUN: 16 mg/dL (ref 8–27)
CO2: 27 mmol/L (ref 20–29)
Calcium: 8.9 mg/dL (ref 8.6–10.2)
Chloride: 103 mmol/L (ref 96–106)
Creatinine, Ser: 1.54 mg/dL — ABNORMAL HIGH (ref 0.76–1.27)
Glucose: 132 mg/dL — ABNORMAL HIGH (ref 70–99)
Potassium: 3.3 mmol/L — ABNORMAL LOW (ref 3.5–5.2)
Sodium: 143 mmol/L (ref 134–144)
eGFR: 50 mL/min/{1.73_m2} — ABNORMAL LOW (ref >59)

## 2021-06-02 LAB — PRO B NATRIURETIC PEPTIDE: NT-Pro BNP: 1994 pg/mL — ABNORMAL HIGH (ref 0–210)

## 2021-06-02 SURGERY — VIDEO BRONCHOSCOPY WITH ENDOBRONCHIAL NAVIGATION
Anesthesia: General | Laterality: Right

## 2021-06-02 NOTE — Telephone Encounter (Signed)
Icard, Manuel Graves, [name]  Cook, Mandi R, CMA; [facility] Triage Pool 32 minutes ago (10:18 AM)   Please separate note.  Case cancelled for today  Follow up in clinic already scheduled   Thanks   BLI     Pt was originally scheduled for bronch today 9/27 but due to nothing showing up on PET, bronch was cancelled. Pt told to f/u as scheduled.  Called and spoke with pt letting him know this info and he verbalized understanding. Nothing further needed.

## 2021-06-03 ENCOUNTER — Other Ambulatory Visit (HOSPITAL_COMMUNITY): Payer: Self-pay

## 2021-06-04 ENCOUNTER — Other Ambulatory Visit: Payer: Self-pay

## 2021-06-04 ENCOUNTER — Other Ambulatory Visit (HOSPITAL_COMMUNITY): Payer: Self-pay

## 2021-06-04 MED ORDER — POTASSIUM CHLORIDE CRYS ER 20 MEQ PO TBCR
20.0000 meq | EXTENDED_RELEASE_TABLET | Freq: Every day | ORAL | 0 refills | Status: DC
  Filled 2021-06-04: qty 90, 90d supply, fill #0

## 2021-06-04 NOTE — Progress Notes (Signed)
Tried calling patient no answer left vm to call back

## 2021-06-04 NOTE — Telephone Encounter (Signed)
Bronch canceled. See later phone notes. Will close encounter.

## 2021-06-04 NOTE — Progress Notes (Signed)
Patient called back he is aware of results and medication

## 2021-06-10 ENCOUNTER — Encounter: Payer: Self-pay | Admitting: Cardiology

## 2021-06-10 ENCOUNTER — Ambulatory Visit: Payer: PPO | Admitting: Adult Health

## 2021-06-10 ENCOUNTER — Other Ambulatory Visit: Payer: Self-pay

## 2021-06-10 ENCOUNTER — Ambulatory Visit: Payer: PPO | Admitting: Cardiology

## 2021-06-10 ENCOUNTER — Encounter: Payer: Self-pay | Admitting: Adult Health

## 2021-06-10 VITALS — BP 154/98 | HR 68 | Temp 97.1°F | Resp 16 | Ht 72.0 in | Wt 230.0 lb

## 2021-06-10 VITALS — BP 140/80 | HR 65 | Temp 98.0°F | Ht 72.0 in | Wt 230.6 lb

## 2021-06-10 DIAGNOSIS — J45909 Unspecified asthma, uncomplicated: Secondary | ICD-10-CM

## 2021-06-10 DIAGNOSIS — Z23 Encounter for immunization: Secondary | ICD-10-CM | POA: Diagnosis not present

## 2021-06-10 DIAGNOSIS — Z86711 Personal history of pulmonary embolism: Secondary | ICD-10-CM | POA: Diagnosis not present

## 2021-06-10 DIAGNOSIS — Z87891 Personal history of nicotine dependence: Secondary | ICD-10-CM | POA: Diagnosis not present

## 2021-06-10 DIAGNOSIS — F17210 Nicotine dependence, cigarettes, uncomplicated: Secondary | ICD-10-CM

## 2021-06-10 DIAGNOSIS — D869 Sarcoidosis, unspecified: Secondary | ICD-10-CM

## 2021-06-10 DIAGNOSIS — I10 Essential (primary) hypertension: Secondary | ICD-10-CM | POA: Diagnosis not present

## 2021-06-10 DIAGNOSIS — G4733 Obstructive sleep apnea (adult) (pediatric): Secondary | ICD-10-CM

## 2021-06-10 DIAGNOSIS — E782 Mixed hyperlipidemia: Secondary | ICD-10-CM

## 2021-06-10 DIAGNOSIS — D860 Sarcoidosis of lung: Secondary | ICD-10-CM | POA: Diagnosis not present

## 2021-06-10 DIAGNOSIS — I5032 Chronic diastolic (congestive) heart failure: Secondary | ICD-10-CM | POA: Diagnosis not present

## 2021-06-10 DIAGNOSIS — R918 Other nonspecific abnormal finding of lung field: Secondary | ICD-10-CM | POA: Diagnosis not present

## 2021-06-10 DIAGNOSIS — Z720 Tobacco use: Secondary | ICD-10-CM

## 2021-06-10 DIAGNOSIS — I4729 Other ventricular tachycardia: Secondary | ICD-10-CM

## 2021-06-10 NOTE — Progress Notes (Signed)
ID:  KHY PITRE, DOB [birth year], MRN [id number]  PCP:  [name], MD  Cardiologist: Rex Kras, DO, [facility] (established care 12/10/2020)  Date: 06/10/21 Last Office Visit: 04/30/2021  Chief Complaint  Patient presents with   Acute heart failure with preserved ejection fraction    Follow-up    HPI  [name] is a 65 y.o. male who presents to the office with a chief complaint of " heart failure management." Patient's past medical history and cardiovascular risk factors include: Sarcoidosis, HTN, Tobacco abuse, Hx of PE (was on Xarelto for 1 year), OSA not on CPAP, MSSA Bacteremia (March 2022), Hx of cardiac arrest (2002 during his hospitalization for pancreatitis per wife).  He is referred to the office at the request of [name], MD for evaluation of heart failure.  In the recent past patient has had multiple episodes of hospitalizations for shortness of breath/heart failure exacerbation.  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  In addition, during his hospitalization he was noted to have NSVT's and frequent PVCs on monitor despite being on beta-blockers and calcium channel blockers.  At the last office visit the shared decision was to proceed with left and right heart catheterization.  He underwent angiography in September 2022 results were reviewed with him in great detail and noted below for further reference.  Patient's weight has remained stable since last office visit.  He is compliant with his heart failure medications.  His most recent labs from 06/01/2021 notes stable renal function and improvement in NT proBNP.  Currently patient is taking Entresto 97/103 mg half a tablet twice daily and Bumex 1 mg p.o. twice daily.  FUNCTIONAL STATUS: No structured exercise program or daily routine.   ALLERGIES: Allergies  Allergen Reactions   Codeine  Nausea Only   Hydrochlorothiazide     Unknown   Hydromorphone Other (See Comments)    hallucinations     MEDICATION LIST PRIOR TO VISIT: Current Meds  Medication Sig   aspirin 81 MG tablet Take 1 tablet (81 mg total) by mouth daily.   atorvastatin (LIPITOR) 20 MG tablet TAKE 1 TABLET BY MOUTH EVERY DAY   bumetanide (BUMEX) 1 MG tablet Take 1 mg by mouth daily.   dapagliflozin propanediol (FARXIGA) 10 MG TABS tablet Take 1 tablet (10 mg total) by mouth daily.   diltiazem (TIADYLT ER) 360 MG 24 hr capsule Take 360 mg by mouth daily.   fluticasone (FLONASE) 50 MCG/ACT nasal spray Place 2 sprays into both nostrils daily as needed for allergies or rhinitis.   hydrALAZINE (APRESOLINE) 100 MG tablet Take 1 tablet (100 mg total) by mouth 3 (three) times daily.   isosorbide mononitrate (IMDUR) 30 MG 24 hr tablet Take 3 tablets (90 mg total) by mouth daily.   metoprolol (TOPROL-XL) 200 MG 24 hr tablet Take 1 tablet by mouth once daily   potassium chloride SA (KLOR-CON) 20 MEQ tablet Take 1 tablet (20 mEq total) by mouth daily.   predniSONE (DELTASONE) 10 MG tablet 40 mg oral daily, decrease by 10 mg every 5 days. (Patient taking differently: Take 5 mg by mouth daily with breakfast.)   sacubitril-valsartan (ENTRESTO) 97-103 MG Take 1 tablet by mouth 2 (two) times daily.   tamsulosin (FLOMAX) 0.4 MG CAPS capsule Take 0.4 mg by mouth daily.   traZODone (DESYREL) 150 MG tablet Take 1 tablet (150 mg total)  by mouth at bedtime as needed for sleep.   [DISCONTINUED] metoprolol succinate (TOPROL-XL) 200 MG 24 hr tablet Take 1 tablet (200 mg total) by mouth daily. Take with or immediately following a meal.     PAST MEDICAL HISTORY: 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  CT   ED (erectile dysfunction)    GERD (gastroesophageal reflux disease)    Hypercalcemia    Hyperlipidemia    Hypertension    Insomnia    Long-term use of high-risk medication    Nephrocalcinosis    Nephrolithiasis    Obesity    Osteopenia    Pancreatitis    admitted 03/19/01-06/05/01   PE (pulmonary embolism) 01/30/2013   Proteinuria    Sarcoidosis    Sleep apnea    Smoker    Stroke [facility])   Patient and wife denies hx of CVA.   PAST SURGICAL HISTORY: Past Surgical History:  Procedure Laterality Date   ABDOMINAL EXPLORATION SURGERY     BACK SURGERY     CHOLECYSTECTOMY     RIGHT/LEFT HEART CATH AND CORONARY ANGIOGRAPHY N/A 05/12/2021   Procedure: RIGHT/LEFT HEART CATH AND CORONARY ANGIOGRAPHY;  Surgeon: [name], MD;  Location: [place] CV [facility];  Service: Cardiovascular;  Laterality: N/A;   SHOULDER ARTHROSCOPY Right 10/30/2020   Procedure: ARTHROSCOPY SHOULDER WITH EXTENSIVE DEBRIDEMENT;  Surgeon: Mcarthur Rossetti, MD;  Location: [place];  Service: Orthopedics;  Laterality: Right;   SHOULDER SURGERY     TRACHEOSTOMY     closed    FAMILY HISTORY: The patient family history includes Alzheimer's disease in his mother; CVA in his father; Heart failure in his sister; Hypertension in his father and sister; Lung cancer in his father.  SOCIAL HISTORY:  The patient  reports that he quit smoking about 3 months ago. His smoking use included cigarettes. He started smoking about 15 years ago. He has a 3.75 pack-year smoking history. He has never used smokeless tobacco. He reports that he does not drink alcohol and does not use drugs.  REVIEW OF SYSTEMS: Review of Systems  Constitutional: Positive for malaise/fatigue (improved) and weight loss. Negative for chills, fever and weight gain.  HENT:  Negative for hoarse voice and nosebleeds.   Eyes:  Negative for discharge, double vision and pain.  Cardiovascular:  Negative for chest pain, claudication,  dyspnea on exertion, leg swelling, near-syncope, orthopnea, palpitations, paroxysmal nocturnal dyspnea and syncope.  Respiratory:  Negative for hemoptysis and shortness of breath.   Musculoskeletal:  Negative for muscle cramps and myalgias.  Gastrointestinal:  Negative for abdominal pain, constipation, diarrhea, hematemesis, hematochezia, melena, nausea and vomiting.  Neurological:  Negative for dizziness and light-headedness.   PHYSICAL EXAM: Vitals with BMI 06/10/2021 06/10/2021 06/10/2021  Height - '6\' 0"'  '6\' 0"'   Weight - 230 lbs 230 lbs 10 oz  BMI - 97.41 63.84  Systolic 536 468 032  Diastolic 98 95 80  Pulse 68 66 65    CONSTITUTIONAL: Appears older than stated age, well-developed and well-nourished. No acute distress.  SKIN: Skin is warm and dry. No rash noted. No cyanosis. No pallor. No jaundice HEAD: Normocephalic and atraumatic.  EYES: No scleral icterus MOUTH/THROAT: Moist oral membranes.  NECK: No  JVD present. No thyromegaly noted. No carotid bruits  LYMPHATIC: No visible cervical adenopathy.  CHEST Normal respiratory effort. No intercostal retractions  LUNGS: Clear to auscultation bilaterally.  No stridor. No wheezes. No rales.  CARDIOVASCULAR: Regular, positive O2-H4, soft holosystolic murmur heard at the apex, no gallops or rubs. ABDOMINAL: Obese, soft soft, nontender, distended, positive bowel sounds in all 4 quadrants, no apparent ascites.  EXTREMITIES: Trace bilateral pitting edema, no open wounds or ulcers.   HEMATOLOGIC: No significant bruising NEUROLOGIC: Oriented to person, place, and time. Nonfocal. Normal muscle tone.  PSYCHIATRIC: Normal mood and affect. Normal behavior. Cooperative  CARDIAC DATABASE: EKG: 12/18/2020: Sinus bradycardia, 58 bpm, left atrial enlargement, diffuse T wave inversions.  No significant change compared to prior ECG.  Echocardiogram: 03/23/2021: LVEF 60-65%, mild LVH, grade 1 diastolic impairment, RVSP 49 mmHg consistent with mild  pulmonary hypertension, severely dilated left atrium, mild MR, mild AR,  Stress Testing: No results found for this or any previous visit from the past 1095 days.  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 If he has angina symptoms in future, could consider PCI to distal RCA.  CT chest without contrast: 04/21/2021 1. Widespread patchy nodular thickening of the peribronchovascular interstitium throughout both lungs, most prominent in the upper lobes, with associated patchy peribronchovascular ground-glass opacity. Masslike 3.7 cm focus of consolidation in the anterior right lower lobe, new. 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 Follow-up outpatient high-resolution chest CT suggested in 3-6 months. 2. Mild cardiomegaly. Three-vessel coronary atherosclerosis. 3. Dilated main pulmonary artery, suggesting pulmonary arterial hypertension. 4. Ectatic 4.4 cm ascending thoracic aorta. Recommend annual imaging followup by CTA or MRA. This recommendation follows 2010 ACCF/AHA/AATS/ACR/ASA/SCA/SCAI/SIR/STS/SVM Guidelines for the Diagnosis and Management of Patients with Thoracic Aortic Disease. Circulation. 2010; 121: T654-Y503. Aortic aneurysm NOS (ICD10-I71.9). 5. Aortic Atherosclerosis (ICD10-I70.0).  LABORATORY DATA: CBC Latest Ref Rng & Units 05/12/2021 05/12/2021 04/22/2021  WBC 4.0 - 10.5 K/uL - - 10.0  Hemoglobin 13.0 - 17.0 g/dL 11.6(L) 11.9(L) 10.2(L)  Hematocrit 39.0 - 52.0 % 34.0(L) 35.0(L)  30.8(L)  Platelets 150 - 400 K/uL - - 221    CMP Latest Ref Rng & Units 06/01/2021 05/12/2021 05/12/2021  Glucose 70 - 99 mg/dL 132(H) - -  BUN 8 - 27 mg/dL 16 - -  Creatinine 0.76 - 1.27 mg/dL 1.54(H) - -  Sodium 134 - 144 mmol/L 143 146(H) 146(H)  Potassium 3.5 - 5.2 mmol/L 3.3(L) 3.5 3.5  Chloride 96 - 106 mmol/L 103 - -  CO2 20 - 29 mmol/L 27 - -  Calcium 8.6 - 10.2 mg/dL 8.9 - -  Total Protein 6.5 - 8.1 g/dL - - -  Total Bilirubin 0.3 - 1.2 mg/dL - - -  Alkaline Phos 38 - 126 U/L - - -  AST 15 - 41 U/L - - -  ALT 0 - 44 U/L - - -  Lipid Panel     Component Value Date/Time   CHOL 111 04/09/2021 0147   TRIG 53 04/09/2021 0147   HDL 62 04/09/2021 0147   CHOLHDL 1.8 04/09/2021 0147   VLDL 11 04/09/2021 0147   LDLCALC 38 04/09/2021 0147    No components found for: NTPROBNP Recent Labs    12/15/20 0909 01/02/21 0752 01/16/21 0713 02/03/21 0717 03/06/21 0712 03/30/21 0707 04/06/21 0725 04/14/21 0712 05/06/21 0713 06/01/21 0713  PROBNP 4,473* 2,337* 1,789* 733* 1,171* 2,643* 5,653* 3,473* 3,912* 1,994*   Recent Labs    11/19/20 0233  TSH 1.247    BMP Recent Labs    04/21/21 0258 04/22/21 0149 04/23/21 0356 04/27/21 0715 05/06/21 0714 05/12/21 0810 05/12/21 0811 06/01/21 0713  NA 140 138 142 147* 146* 146* 146* 143  K 3.7 3.6 3.4* 3.9 4.0 3.5 3.5 3.3*  CL 101 101 100 107* 105  --   --  103  CO2 29 31 32 28 28  --   --  27  GLUCOSE 212* 163* 135* 123* 122*  --   --  132*  BUN 23 25* 27* 26 21  --   --  16  CREATININE 2.00* 1.95* 1.84* 1.70* 1.53*  --   --  1.54*  CALCIUM 8.7* 8.6* 9.3 9.3 8.9  --   --  8.9  GFRNONAA 37* 38* 40*  --   --   --   --   --     HEMOGLOBIN A1C Lab Results  Component Value Date   HGBA1C 5.7 (H) 04/08/2021   MPG 116.89 04/08/2021    External Labs: Collected: 12/08/2020 Creatinine 1.7 mg/dL. (Serum creatinine 1.6 mg/dL collected 07/11/2020) eGFR: 40.9 mL/min per 1.73 m Sodium 143, potassium 3.5, chloride 104, bicarb  32 Hemoglobin 12.6, hematocrit 38.2 NT proBNP 5576  IMPRESSION:    ICD-10-CM   1. Chronic heart failure with preserved ejection fraction (HFpEF) (HCC)  Z61.09 Basic metabolic panel    Magnesium    Pro b natriuretic peptide (BNP)    2. NSVT (nonsustained ventricular tachycardia)  I47.29     3. Essential hypertension  I10     4. Mixed hyperlipidemia  E78.2     5. Hx pulmonary embolism  Z86.711     6. OSA (obstructive sleep apnea)  G47.33     7. Sarcoidosis of lung ([place])  D86.0     8. Former smoker  Z87.891         RECOMMENDATIONS: [name] is a 65 y.o. male whose past medical history and cardiac risk factors include: Sarcoidosis, HTN, Tobacco abuse, Hx of PE (was on Xarelto for 1 year), OSA not on CPAP, MSSA Bacteremia (March 2022), Hx of cardiac arrest (2002 during his hospitalization for pancreatitis per wife).  Chronic heart failure with preserved ejection fraction (HFpEF) ([place]) Last hospitalization in August 2022. Remains euvolemic and not in congestive heart failure. Most recent labs from 06/01/2021 independently reviewed which notes improvement in NT proBNP and stable renal function. Medications reconciled Currently taking Entresto 97/103 mg 0.5 tablets twice a day, increase it to 1 tablet twice a day. Currently taking Bumex 1 mg p.o. twice daily, reduce it to 1 mg p.o. daily. Labs in 1 week to evaluate kidney function and electrolytes  NSVT (nonsustained ventricular tachycardia) Currently on beta-blockers and calcium channel blockers. Underwent an ischemic evaluation as outlined above. Continue to monitor.  Essential hypertension Home blood pressures are very well controlled. Office blood pressures within acceptable range but could be  better. Increase Entresto as discussed above. Low-salt diet recommended.  Mixed hyperlipidemia Currently on atorvastatin.   He denies myalgia or other side effects. Most recent lipids dated 04/2021 reviewed as noted  above. Currently managed by primary care provider.  OSA (obstructive sleep apnea) Currently waiting for CPAP machine, tentatively scheduled for January 2023  Sarcoidosis of lung [facility]) Follows up with pulmonary medicine.  Former smoker Has stopped smoking as of September 2022. He is congratulated on his efforts. Continue to monitor.  FINAL MEDICATION LIST END OF ENCOUNTER: No orders of the defined types were placed in this encounter.    Medications Discontinued During This Encounter  Medication Reason   BLACK ELDERBERRY PO Error   diltiazem (CARDIZEM CD) 360 MG 24 hr capsule Error   Metoprolol Succinate (KAPSPARGO SPRINKLE) 200 MG CS24 Error   nicotine (NICODERM CQ - DOSED IN MG/24 HR) 7 mg/24hr patch Error   pantoprazole (PROTONIX) 40 MG tablet Error   metoprolol succinate (TOPROL-XL) 200 MG 24 hr tablet Error      Current Outpatient Medications:    aspirin 81 MG tablet, Take 1 tablet (81 mg total) by mouth daily., Disp: , Rfl:    atorvastatin (LIPITOR) 20 MG tablet, TAKE 1 TABLET BY MOUTH EVERY DAY, Disp: 90 tablet, Rfl: 3   bumetanide (BUMEX) 1 MG tablet, Take 1 mg by mouth daily., Disp: , Rfl:    dapagliflozin propanediol (FARXIGA) 10 MG TABS tablet, Take 1 tablet (10 mg total) by mouth daily., Disp: 90 tablet, Rfl: 3   diltiazem (TIADYLT ER) 360 MG 24 hr capsule, Take 360 mg by mouth daily., Disp: , Rfl:    fluticasone (FLONASE) 50 MCG/ACT nasal spray, Place 2 sprays into both nostrils daily as needed for allergies or rhinitis., Disp: , Rfl:    hydrALAZINE (APRESOLINE) 100 MG tablet, Take 1 tablet (100 mg total) by mouth 3 (three) times daily., Disp: 90 tablet, Rfl: 3   isosorbide mononitrate (IMDUR) 30 MG 24 hr tablet, Take 3 tablets (90 mg total) by mouth daily., Disp: 90 tablet, Rfl: 6   metoprolol (TOPROL-XL) 200 MG 24 hr tablet, Take 1 tablet by mouth once daily, Disp: 90 tablet, Rfl: 3   potassium chloride SA (KLOR-CON) 20 MEQ tablet, Take 1 tablet (20 mEq total) by  mouth daily., Disp: 90 tablet, Rfl: 0   predniSONE (DELTASONE) 10 MG tablet, 40 mg oral daily, decrease by 10 mg every 5 days. (Patient taking differently: Take 5 mg by mouth daily with breakfast.), Disp: 45 tablet, Rfl: 0   sacubitril-valsartan (ENTRESTO) 97-103 MG, Take 1 tablet by mouth 2 (two) times daily., Disp: , Rfl:    tamsulosin (FLOMAX) 0.4 MG CAPS capsule, Take 0.4 mg by mouth daily., Disp: , Rfl:    traZODone (DESYREL) 150 MG tablet, Take 1 tablet (150 mg total) by mouth at bedtime as needed for sleep., Disp: 90 tablet, Rfl: 3  Orders Placed This Encounter  Procedures   Basic metabolic panel   Magnesium   Pro b natriuretic peptide (BNP)      There are no Patient Instructions on file for this visit.  --Continue cardiac medications as reconciled in final medication list. --Return in about 3 months (around 09/10/2021) for Follow up, heart failure management.. Or sooner if needed. --Continue follow-up with your primary care physician regarding the management of your other chronic comorbid conditions.  Patient's questions and concerns were addressed to his satisfaction. He voices understanding of the instructions provided during this encounter.   This note was  created using a voice recognition software as a result there may be grammatical errors inadvertently enclosed that do not reflect the nature of this encounter. Every attempt is made to correct such errors.   Rex Kras, [place], [facility]  Pager: [phone] Office: [phone]

## 2021-06-10 NOTE — Assessment & Plan Note (Signed)
Smoking cessation encouraged!

## 2021-06-10 NOTE — Assessment & Plan Note (Signed)
PET scan 05/27/21 showed resolution.  Most likely indicative of recent inflammatory/infectious process versus sarcoid flare.   Plan  Patient Instructions  Continue follow up with eye doctor  Flu shot today  Activity as tolerated.  Working on not smoking .  Continue follow up with neuro for sleep apnea.  Follow up with Dr Valeta Harms or Minoru Chap NP in 3 months with PFT and As needed

## 2021-06-10 NOTE — Assessment & Plan Note (Signed)
Underlying obstructive sleep apnea.  Following with neurology.  Awaiting his CPAP arrival

## 2021-06-10 NOTE — Patient Instructions (Signed)
Continue follow up with eye doctor  Flu shot today  Activity as tolerated.  Working on not smoking .  Continue follow up with neuro for sleep apnea.  Follow up with Dr Valeta Harms or Tameka Hoiland NP in 3 months with PFT and As needed

## 2021-06-10 NOTE — Assessment & Plan Note (Signed)
Continue follow-up with cardiology and maintenance regimen

## 2021-06-10 NOTE — Addendum Note (Signed)
Addended by: Mathis Bud on: 06/10/2021 12:37 PM   Modules accepted: Orders

## 2021-06-10 NOTE — Assessment & Plan Note (Signed)
Longstanding history of sarcoidosis-diagnosis made by CT imaging.  Patient without biopsy.  Most recent PET scan last month showing no hypermetabolic activity in pulmonary nodules masses or adenopathy.  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 Needs PFTs.  Check ACE level.  Plan  Patient Instructions  Continue follow up with eye doctor  Flu shot today  Activity as tolerated.  Working on not smoking .  Continue follow up with neuro for sleep apnea.  Follow up with Dr Valeta Harms or Rapheal Masso NP in 3 months with PFT and As needed

## 2021-06-10 NOTE — Progress Notes (Signed)
@Patient  ID: [name], male    DOB: October 10, 1955, 65 y.o.   MRN: [id number]  Chief Complaint  Patient presents with   Follow-up    Referring provider: [name], MD  HPI: 65 year old male active smoker seen for pulmonary consult May 13, 2021 for abnormal CT chest with mediastinal adenopathy and right lower lobe lung lesion.  Medical history is significant for sarcoidosis diagnosed by imaging only with no previous biopsy. Medical history significant for congestive heart failure, PE, CVA, chronic kidney disease, prolonged QT interval  TEST/EVENTS :  04/21/2021: CT scan of the chest. 3.7 x 2.4 cm right lower lobe peripheral based lesion, areas of patchy nodularity and peribronchovascular thickening with his history of sarcoid  R/L Heart Cath borderline pulmonary hypertension with mean pulmonary artery pressure 20 mmHg, nonobstructive coronary artery disease  06/10/2021 Follow up : Sarcoid , Abnormal CT chest Patient returns for a 1 month follow-up.  Patient was seen last visit for pulmonary consult for abnormal CT chest that revealed 3.7 x 2.4 focus of consolidation in the right lower lobe.  Widespread patchy nodular thickening in the peribronchial interstitium bilaterally and mediastinal adenopathy Patient was set up for a PET scan that was completed on May 27, 2021.  This showed no hypermetabolic mediastinal or hilar lymph nodes.  No hypermetabolic pulmonary nodules or masses.  Previously visualized masslike consolidation in the anterior right lower lobe has resolved.  Slightly decreased widespread patchy nodular thickening of the peribronchial vascular interstitium.  Most prominent in the upper lobes with associated patchy groundglass opacities.  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 No significant cough or dyspnea. No rash  Follows ophthalmology 3 times a year.  LFTs normal .  Patient says he has had sarcoidosis diagnosis for more than 20 years.  Has been on prednisone for a very long time baseline prednisone dose is at 5 mg year a little and then I on those my and then and then it like forms at 10.  Retired from [employer]. Mild activities, light yard work, house chores , driving , grandkids .   Has COPD listed on medical history. No PFTs on file. Not on inhalers. Active smoker . Started smoking 15 yr ago, 1 PPD. Has cut back recently to 5 cigs daily . Using nicotine patches to quit . Smoking cessation .    Patient was admitted in August 2022 for COPD exacerbation acute hypoxic respiratory failure, and decompensated heart failure.  He was treated with BiPAP support.  Aggressive diuresis.  And IV steroids.  He was discharged on prednisone 5 mg daily.  As he has been on chronic steroids for several years.  Patient has OSA. CPAP has been ordered . Followed by neurology . Waiting on CPAP to come.     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   ED (erectile dysfunction)    GERD (gastroesophageal reflux disease)    Hypercalcemia    Hyperlipidemia    Hypertension    Insomnia    Long-term use of high-risk medication    Nephrocalcinosis    Nephrolithiasis    Obesity    Osteopenia    Pancreatitis    admitted 03/19/01-06/05/01   PE (pulmonary embolism) 01/30/2013   Proteinuria     Sarcoidosis    Sleep apnea    Smoker    Stroke ([place])     Tobacco History: Social History   Tobacco Use  Smoking Status Former   Packs/day: 0.25   Years: 15.00   Pack years: 3.75   Types: Cigarettes   Start date: 09/06/2005   Quit date: 02/23/2021   Years since quitting: 0.2  Smokeless Tobacco Never   Counseling given: Yes   Outpatient Medications Prior to Visit  Medication Sig Dispense Refill   aspirin 81 MG tablet Take 1 tablet (81 mg total) by mouth daily.     atorvastatin (LIPITOR) 20 MG tablet TAKE 1 TABLET BY MOUTH EVERY DAY 90 tablet 3   BLACK ELDERBERRY PO Take 2 each by mouth daily as needed (immune support). Gummies     bumetanide (BUMEX) 1 MG tablet Take 1 mg by mouth daily.     dapagliflozin propanediol (FARXIGA) 10 MG TABS tablet Take 1 tablet (10 mg total) by mouth daily. 90 tablet 3   diltiazem (CARDIZEM CD) 360 MG 24 hr capsule Take 360 mg by mouth daily.     fluticasone (FLONASE) 50 MCG/ACT nasal spray Place 2 sprays into both nostrils daily as needed for allergies or rhinitis.     hydrALAZINE (APRESOLINE) 100 MG tablet Take 1 tablet (100 mg total) by mouth 3 (three) times daily. 90 tablet 3   isosorbide mononitrate (IMDUR) 30 MG 24 hr tablet Take 3 tablets (90 mg total) by mouth daily. 90 tablet 6   metoprolol (TOPROL-XL) 200 MG 24 hr tablet Take 1 tablet by mouth once daily 90 tablet 3   Metoprolol Succinate (KAPSPARGO SPRINKLE) 200 MG CS24 Take 1 capsule by mouth once a day 90 capsule 3   nicotine (NICODERM CQ - DOSED IN MG/24 HR) 7 mg/24hr patch Place 1 patch onto the skin daily. 28 patch 0   pantoprazole (PROTONIX) 40 MG tablet Take 1 tablet (40 mg total) by mouth daily. 30 tablet 1   potassium chloride SA (KLOR-CON) 20 MEQ tablet Take 1 tablet (20 mEq total) by mouth daily. 90 tablet 0   predniSONE (DELTASONE) 10 MG tablet 40 mg oral daily, decrease by 10 mg every 5 days. (Patient taking differently: Take 5 mg by mouth daily with breakfast.) 45 tablet 0    tamsulosin (FLOMAX) 0.4 MG CAPS capsule Take 0.4 mg by mouth daily.     traZODone (DESYREL) 150 MG tablet Take 1 tablet (150 mg total) by mouth at bedtime as needed for sleep. 90 tablet 3   metoprolol succinate (TOPROL-XL) 200 MG 24 hr tablet Take 1 tablet (200 mg total) by mouth daily. Take with or immediately following a meal. (Patient not taking: No sig reported) 30 tablet 0   No facility-administered medications prior to visit.     Review of Systems:   Constitutional:   No  weight loss, night sweats,  Fevers, chills,  +fatigue, or  lassitude.  HEENT:   No headaches,  Difficulty swallowing,  Tooth/dental problems, or  Sore throat,  No sneezing, itching, ear ache, nasal congestion, post nasal drip,   CV:  No chest pain,  Orthopnea, PND, swelling in lower extremities, anasarca, dizziness, palpitations, syncope.   GI  No heartburn, indigestion, abdominal pain, nausea, vomiting, diarrhea, change in bowel habits, loss of appetite, bloody stools.   Resp: .  No chest wall deformity  Skin: no rash or lesions.  GU: no dysuria, change in color of urine, no urgency or frequency.  No flank pain, no hematuria   MS:  No joint pain or swelling.  No decreased range of motion.  No back pain.    Physical Exam  BP 140/80 (BP Location: Left Arm, Cuff Size: Large)   Pulse 65   Temp 98 F (36.7 C) (Temporal)   Ht 6' (1.829 m)   Wt 230 lb 9.6 oz (104.6 kg)   SpO2 96%   BMI 31.27 kg/m   GEN: A/Ox3; pleasant , NAD, well nourished    HEENT:  [place]/AT,   NOSE-clear, THROAT-clear, no lesions, no postnasal drip or exudate noted. Class 3 MP airway   NECK:  Supple w/ fair ROM; no JVD; normal carotid impulses w/o bruits; no thyromegaly or nodules palpated; no lymphadenopathy.    RESP  Clear  P & A; w/o, wheezes/ rales/ or rhonchi. no accessory muscle use, no dullness to percussion  CARD:  RRR, no m/r/g, no peripheral edema, pulses intact, no cyanosis or clubbing.  GI:   Soft & nt;  nml bowel sounds; no organomegaly or masses detected.   Musco: Warm bil, no deformities or joint swelling noted.   Neuro: alert, no focal deficits noted.    Skin: Warm, no lesions or rashes    Lab Results:  CBC    Component Value Date/Time   WBC 10.0 04/22/2021 0149   RBC 3.40 (L) 04/22/2021 0149   HGB 11.6 (L) 05/12/2021 0811   HCT 34.0 (L) 05/12/2021 0811   PLT 221 04/22/2021 0149   MCV 90.6 04/22/2021 0149   MCV 89.4 11/02/2015 1003   MCH 30.0 04/22/2021 0149   MCHC 33.1 04/22/2021 0149   RDW 15.7 (H) 04/22/2021 0149   LYMPHSABS 0.5 (L) 04/17/2021 1930   MONOABS 0.5 04/17/2021 1930   EOSABS 0.4 04/17/2021 1930   BASOSABS 0.0 04/17/2021 1930    BMET    Component Value Date/Time   NA 143 06/01/2021 0713   K 3.3 (L) 06/01/2021 0713   CL 103 06/01/2021 0713   CO2 27 06/01/2021 0713   GLUCOSE 132 (H) 06/01/2021 0713   GLUCOSE 135 (H) 04/23/2021 0356   BUN 16 06/01/2021 0713   CREATININE 1.54 (H) 06/01/2021 0713   CREATININE 1.58 (H) 02/07/2015 0920   CALCIUM 8.9 06/01/2021 0713   CALCIUM 11.9 Result repeated and verified. (H) 02/08/2007 0350   GFRNONAA 40 (L) 04/23/2021 0356   GFRAA 58 (L) 03/14/2019 1124    BNP    Component Value Date/Time   BNP 1,102.7 (H) 04/27/2021 0715   BNP 1,205.3 (H) 04/22/2021 0149    ProBNP    Component Value Date/Time   PROBNP 1,994 (H) 06/01/2021 0713    Imaging: CARDIAC CATHETERIZATION  Addendum Date: 05/12/2021   LM: Normal LAD: Ectatic vessel         Diag 1 ostial 50% stenosis LCX: Ectatic vessel         Ostial Lcx 30% stenosis RCA: Ectatic vessel, tapers rapidly distally         Large RV marginal branch supplies more territory than distal RCA  Distal RCA focal 70% stenosis         Borderline pulmonary hypertension (mPAP 20 mmHg) 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 If he has angina symptoms in future, could consider PCI to distal RCA.  [name], MD Pager: [phone] Office: [phone]   Result Date: 05/12/2021 Images from the original result were not included. 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 If he has angina symptoms in future, could consider PCI to distal RCA. [name], MD Pager: [phone] Office: [phone]  NM PET Image Initial (PI) Skull Base To Thigh (F-18 FDG)  Result Date: 05/28/2021 CLINICAL DATA:  Initial treatment strategy for masslike consolidation in the anterior right lower lobe. EXAM: NUCLEAR MEDICINE PET SKULL BASE TO THIGH TECHNIQUE: 11.4 mCi F-18 FDG was injected intravenously. Full-ring PET imaging was performed from the skull base to thigh after the radiotracer. CT data was obtained and used for attenuation correction and anatomic localization. Fasting blood glucose: 143 mg/dl COMPARISON:  Chest CT April 21, 2021 FINDINGS: Mediastinal blood pool activity: SUV max 2.81 Liver activity: SUV max NA NECK: No hypermetabolic lymph nodes in the neck. Incidental CT findings: No pathologically enlarged cervical lymph nodes. Dental hardware. CHEST: No hypermetabolic mediastinal or hilar nodes and no hypermetabolic pulmonary nodules or masses. Previously visualized masslike consolidation in the anterior right lower lobe has resolved in the interval. Slightly decreased widespread patchy nodular thickening of the peribronchovascular interstitium bilaterally, most prominent in the upper lobes with associated patchy peribronchovascular ground-glass opacities similar prior. Previously indexed nodular  and and band like peribronchovascular consolidation in the left lower lobe is non hypermetabolic within nodular component measuring 1.6 by 8 mm and is not significantly changed from CT April 21, 2021. Interval resolution of the represent is indistinct peribronchovascular 7 mm posterior right upper lobe nodule. Prominent and mildly enlarged mediastinal and hilar lymph nodes several of which are coarsely calcified. Decreased size of the representative noncalcified left pre-vascular lymph node which now measures 1 cm on image 70/4 previously 1.6 cm. Incidental CT findings: Aortic atherosclerosis without aneurysmal dilation. Coronary artery calcifications. Cardiomegaly. ABDOMEN/PELVIS: No abnormal hypermetabolic activity within the liver, pancreas, adrenal glands, or spleen. No hypermetabolic lymph nodes in the abdomen or pelvis. Incidental CT findings: Unremarkable noncontrast appearance of the hepatic, pancreatic and splenic parenchyma. No hydronephrosis. Normal appendix. Nonobstructive bowel gas pattern. Colonic diverticulosis without findings of acute diverticulitis. SKELETON: No focal hypermetabolic activity to suggest skeletal metastasis. Incidental CT findings: Lumbar fusion hardware. No aggressive lytic or blastic lesion of bone. IMPRESSION: 1. Interval resolution of the previously visualized mass like consolidation in the anterior right lower lobe. 2. Interval decrease in the widespread patchy nodular thickening of the peribronchovascular interstitium and associated patchy peribronchovascular ground-glass opacities and nodularity. Similar to slightly decreased size of the calcified and noncalcified mediastinal and hilar lymph nodes. Constellation of findings again likely reflecting sequela of  pulmonary sarcoid. Electronically Signed   By: Dahlia Bailiff M.D.   On: 05/28/2021 10:35      No flowsheet data found.  No results found for: NITRICOXIDE      Assessment & Plan:   No problem-specific  Assessment & Plan notes found for this encounter.     Rexene Edison, NP 06/10/2021

## 2021-06-15 ENCOUNTER — Other Ambulatory Visit: Payer: PPO

## 2021-06-15 DIAGNOSIS — D869 Sarcoidosis, unspecified: Secondary | ICD-10-CM | POA: Diagnosis not present

## 2021-06-15 DIAGNOSIS — J45909 Unspecified asthma, uncomplicated: Secondary | ICD-10-CM | POA: Diagnosis not present

## 2021-06-16 ENCOUNTER — Other Ambulatory Visit (HOSPITAL_COMMUNITY): Payer: Self-pay

## 2021-06-17 ENCOUNTER — Telehealth: Payer: Self-pay | Admitting: Cardiology

## 2021-06-17 ENCOUNTER — Other Ambulatory Visit (HOSPITAL_COMMUNITY): Payer: Self-pay

## 2021-06-17 LAB — ANGIOTENSIN CONVERTING ENZYME: Angiotensin-Converting Enzyme: 13 U/L (ref 9–67)

## 2021-06-17 MED ORDER — TAMSULOSIN HCL 0.4 MG PO CAPS
ORAL_CAPSULE | ORAL | 6 refills | Status: DC
  Filled 2021-06-17: qty 30, 30d supply, fill #0
  Filled 2021-07-18: qty 30, 30d supply, fill #1
  Filled 2021-08-15: qty 30, 30d supply, fill #2
  Filled 2021-09-12: qty 30, 30d supply, fill #3
  Filled 2021-10-16: qty 30, 30d supply, fill #4
  Filled 2021-11-25: qty 30, 30d supply, fill #5
  Filled 2021-12-27: qty 30, 30d supply, fill #6

## 2021-06-17 NOTE — Telephone Encounter (Signed)
Req refill for    sacubitril-valsartan (ENTRESTO) 97-103 MG     [facility]

## 2021-06-18 ENCOUNTER — Other Ambulatory Visit: Payer: Self-pay

## 2021-06-18 ENCOUNTER — Other Ambulatory Visit (HOSPITAL_COMMUNITY): Payer: Self-pay

## 2021-06-18 MED ORDER — ENTRESTO 97-103 MG PO TABS
1.0000 | ORAL_TABLET | Freq: Two times a day (BID) | ORAL | 3 refills | Status: DC
  Filled 2021-06-18 – 2021-06-19 (×2): qty 60, 30d supply, fill #0
  Filled 2021-07-18: qty 60, 30d supply, fill #1
  Filled 2021-08-15: qty 60, 30d supply, fill #2
  Filled 2021-09-22: qty 60, 30d supply, fill #3

## 2021-06-18 MED ORDER — PREDNISONE 5 MG PO TABS
ORAL_TABLET | ORAL | 3 refills | Status: DC
  Filled 2021-06-18: qty 90, 90d supply, fill #0
  Filled 2021-09-12: qty 90, 90d supply, fill #1

## 2021-06-18 MED ORDER — ENTRESTO 97-103 MG PO TABS
1.0000 | ORAL_TABLET | Freq: Two times a day (BID) | ORAL | 3 refills | Status: DC
  Filled 2021-06-18: qty 60, 30d supply, fill #0

## 2021-06-19 ENCOUNTER — Other Ambulatory Visit (HOSPITAL_COMMUNITY): Payer: Self-pay

## 2021-06-23 ENCOUNTER — Other Ambulatory Visit (HOSPITAL_COMMUNITY): Payer: Self-pay

## 2021-06-24 ENCOUNTER — Other Ambulatory Visit: Payer: Self-pay

## 2021-06-24 DIAGNOSIS — I5032 Chronic diastolic (congestive) heart failure: Secondary | ICD-10-CM

## 2021-06-29 ENCOUNTER — Other Ambulatory Visit (HOSPITAL_COMMUNITY): Payer: Self-pay

## 2021-06-29 MED ORDER — TRAZODONE HCL 150 MG PO TABS
150.0000 mg | ORAL_TABLET | Freq: Every evening | ORAL | 0 refills | Status: DC
  Filled 2021-06-29: qty 90, 90d supply, fill #0

## 2021-07-18 ENCOUNTER — Other Ambulatory Visit (HOSPITAL_COMMUNITY): Payer: Self-pay

## 2021-07-20 ENCOUNTER — Other Ambulatory Visit: Payer: Self-pay

## 2021-07-20 ENCOUNTER — Other Ambulatory Visit (HOSPITAL_COMMUNITY): Payer: Self-pay

## 2021-07-20 DIAGNOSIS — I129 Hypertensive chronic kidney disease with stage 1 through stage 4 chronic kidney disease, or unspecified chronic kidney disease: Secondary | ICD-10-CM | POA: Diagnosis not present

## 2021-07-20 DIAGNOSIS — I10 Essential (primary) hypertension: Secondary | ICD-10-CM | POA: Diagnosis not present

## 2021-07-20 DIAGNOSIS — D860 Sarcoidosis of lung: Secondary | ICD-10-CM | POA: Diagnosis not present

## 2021-07-20 DIAGNOSIS — N2581 Secondary hyperparathyroidism of renal origin: Secondary | ICD-10-CM | POA: Diagnosis not present

## 2021-07-20 DIAGNOSIS — N209 Urinary calculus, unspecified: Secondary | ICD-10-CM | POA: Diagnosis not present

## 2021-07-20 DIAGNOSIS — I2510 Atherosclerotic heart disease of native coronary artery without angina pectoris: Secondary | ICD-10-CM | POA: Diagnosis not present

## 2021-07-20 DIAGNOSIS — N183 Chronic kidney disease, stage 3 unspecified: Secondary | ICD-10-CM | POA: Diagnosis not present

## 2021-07-20 DIAGNOSIS — D631 Anemia in chronic kidney disease: Secondary | ICD-10-CM | POA: Diagnosis not present

## 2021-07-20 DIAGNOSIS — R319 Hematuria, unspecified: Secondary | ICD-10-CM | POA: Diagnosis not present

## 2021-07-20 DIAGNOSIS — N051 Unspecified nephritic syndrome with focal and segmental glomerular lesions: Secondary | ICD-10-CM | POA: Diagnosis not present

## 2021-07-20 MED ORDER — DILTIAZEM HCL ER BEADS 360 MG PO CP24
ORAL_CAPSULE | ORAL | 3 refills | Status: DC
  Filled 2021-07-20: qty 90, 90d supply, fill #0

## 2021-07-20 MED ORDER — BUMETANIDE 1 MG PO TABS
1.0000 mg | ORAL_TABLET | Freq: Every day | ORAL | 0 refills | Status: DC
  Filled 2021-07-20: qty 90, 90d supply, fill #0

## 2021-07-24 DIAGNOSIS — G4733 Obstructive sleep apnea (adult) (pediatric): Secondary | ICD-10-CM | POA: Diagnosis not present

## 2021-08-01 ENCOUNTER — Other Ambulatory Visit (HOSPITAL_COMMUNITY): Payer: Self-pay

## 2021-08-04 ENCOUNTER — Other Ambulatory Visit (HOSPITAL_COMMUNITY): Payer: Self-pay

## 2021-08-04 ENCOUNTER — Other Ambulatory Visit: Payer: Self-pay | Admitting: Internal Medicine

## 2021-08-04 DIAGNOSIS — R911 Solitary pulmonary nodule: Secondary | ICD-10-CM

## 2021-08-04 MED ORDER — OSELTAMIVIR PHOSPHATE 75 MG PO CAPS
ORAL_CAPSULE | ORAL | 0 refills | Status: DC
  Filled 2021-08-04: qty 10, 5d supply, fill #0

## 2021-08-05 DIAGNOSIS — I509 Heart failure, unspecified: Secondary | ICD-10-CM | POA: Diagnosis not present

## 2021-08-05 DIAGNOSIS — Z6832 Body mass index (BMI) 32.0-32.9, adult: Secondary | ICD-10-CM | POA: Diagnosis not present

## 2021-08-11 ENCOUNTER — Other Ambulatory Visit (HOSPITAL_COMMUNITY): Payer: Self-pay

## 2021-08-11 MED ORDER — CEFDINIR 300 MG PO CAPS
ORAL_CAPSULE | ORAL | 0 refills | Status: DC
  Filled 2021-08-11: qty 20, 10d supply, fill #0

## 2021-08-12 ENCOUNTER — Other Ambulatory Visit (HOSPITAL_COMMUNITY): Payer: Self-pay

## 2021-08-12 DIAGNOSIS — R0981 Nasal congestion: Secondary | ICD-10-CM | POA: Diagnosis not present

## 2021-08-12 DIAGNOSIS — R059 Cough, unspecified: Secondary | ICD-10-CM | POA: Diagnosis not present

## 2021-08-12 DIAGNOSIS — R509 Fever, unspecified: Secondary | ICD-10-CM | POA: Diagnosis not present

## 2021-08-12 DIAGNOSIS — R5383 Other fatigue: Secondary | ICD-10-CM | POA: Diagnosis not present

## 2021-08-12 DIAGNOSIS — J449 Chronic obstructive pulmonary disease, unspecified: Secondary | ICD-10-CM | POA: Diagnosis not present

## 2021-08-12 DIAGNOSIS — Z1152 Encounter for screening for COVID-19: Secondary | ICD-10-CM | POA: Diagnosis not present

## 2021-08-12 DIAGNOSIS — G4733 Obstructive sleep apnea (adult) (pediatric): Secondary | ICD-10-CM | POA: Diagnosis not present

## 2021-08-12 MED ORDER — PROMETHAZINE-DM 6.25-15 MG/5ML PO SYRP
ORAL_SOLUTION | ORAL | 0 refills | Status: DC
Start: 2021-08-12 — End: 2021-09-21
  Filled 2021-08-12: qty 140, 7d supply, fill #0

## 2021-08-17 ENCOUNTER — Other Ambulatory Visit (HOSPITAL_COMMUNITY): Payer: Self-pay

## 2021-08-20 ENCOUNTER — Other Ambulatory Visit (HOSPITAL_COMMUNITY): Payer: Self-pay

## 2021-08-23 DIAGNOSIS — G4733 Obstructive sleep apnea (adult) (pediatric): Secondary | ICD-10-CM | POA: Diagnosis not present

## 2021-08-24 ENCOUNTER — Encounter: Payer: Self-pay | Admitting: Neurology

## 2021-08-24 ENCOUNTER — Ambulatory Visit: Payer: PPO | Admitting: Neurology

## 2021-08-24 VITALS — BP 130/85 | HR 72 | Ht 72.0 in | Wt 224.5 lb

## 2021-08-24 DIAGNOSIS — Z789 Other specified health status: Secondary | ICD-10-CM | POA: Diagnosis not present

## 2021-08-24 DIAGNOSIS — J441 Chronic obstructive pulmonary disease with (acute) exacerbation: Secondary | ICD-10-CM

## 2021-08-24 DIAGNOSIS — I5041 Acute combined systolic (congestive) and diastolic (congestive) heart failure: Secondary | ICD-10-CM | POA: Diagnosis not present

## 2021-08-24 DIAGNOSIS — G478 Other sleep disorders: Secondary | ICD-10-CM | POA: Diagnosis not present

## 2021-08-24 DIAGNOSIS — G4734 Idiopathic sleep related nonobstructive alveolar hypoventilation: Secondary | ICD-10-CM

## 2021-08-24 DIAGNOSIS — G4733 Obstructive sleep apnea (adult) (pediatric): Secondary | ICD-10-CM

## 2021-08-24 NOTE — Patient Instructions (Signed)
Sleep Study, Adult A sleep study (polysomnogram) is a series of tests done while you are sleeping. A sleep study records your brain waves, heart rate, breathing rate, oxygen level, and eye and leg movements. A sleep study helps your health care provider: See how well you sleep. Diagnose a sleep disorder. Determine how severe your sleep disorder is. Create a plan to treat your sleep disorder. Your health care provider may recommend a sleep study if you: Feel sleepy on most days. Snore loudly while sleeping. Have unusual behaviors while you sleep, such as walking. Have brief periods in which you stop breathing during sleep (sleepapnea). Fall asleep suddenly during the day (narcolepsy). Have trouble falling asleep or staying asleep (insomnia). Feel like you need to move your legs when trying to fall asleep (restless legs syndrome). Move your legs by flexing and extending them regularly while asleep (periodic limb movement disorder). Act out your dreams while you sleep (sleep behavior disorder). Feel like you cannot move when you first wake up (sleep paralysis). What tests are part of a sleep study? Most sleep studies record the following during sleep: Brain activity. Eye movements. Heart rate and rhythm. Breathing rate and rhythm. Blood-oxygen level. Blood pressure. Chest and belly movement as you breathe. Arm and leg movements. Snoring or other noises. Body position. Where are sleep studies done? Sleep studies are done at sleep centers. A sleep center may be inside a hospital, office, or clinic. The room where you have the study may look like a hospital room or a hotel room. The health care providers doing the study may come in and out of the room during the study. Most of the time, they will be in another room monitoring your test as you sleep. How are sleep studies done? Most sleep studies are done during a normal period of time for a full night of sleep. You will arrive at the  study center in the evening and go home in the morning. Before the test Bring your pajamas and toothbrush with you to the sleep study. Do not have caffeine on the day of your sleep study. Do not drink alcohol on the day of your sleep study. Your health care provider will let you know if you should stop taking any of your regular medicines before the test. During the test   Round, sticky patches with sensors attached to recording wires (electrodes) are placed on your scalp, face, chest, and limbs. Wires from all the electrodes and sensors run from your bed to a computer. The wires can be taken off and put back on if you need to get out of bed to go to the bathroom. A sensor is placed over your nose to measure airflow. A finger clip is put on your finger or ear to measure your blood oxygen level (pulse oximetry). A belt is placed around your belly and a belt is placed around your chest to measure breathing movements. If you have signs of the sleep disorder called sleep apnea during your test, you may get a treatment mask to wear for the second half of the night. The mask provides positive airway pressure (PAP) to help you breathe better during sleep. This may greatly improve your sleep apnea. You will then have all tests done again with the mask in place to see if your measurements and recordings change. After the test A medical doctor who specializes in sleep will evaluate the results of your sleep study and share them with you and your primary health  care provider. Based on your results, your medical history, and a physical exam, you may be diagnosed with a sleep disorder, such as: Sleep apnea. Restless legs syndrome. Sleep-related behavior disorder. Sleep-related movement disorders. Sleep-related seizure disorders. Your health care team will help determine your treatment options based on your diagnosis. This may include: Improving your sleep habits (sleep hygiene). Wearing a continuous  positive airway pressure (CPAP) or bi-level positive airway pressure (BPAP) mask. Wearing an oral device at night to improve breathing and reduce snoring. Taking medicines. Follow these instructions at home: Take over-the-counter and prescription medicines only as told by your health care provider. If you are instructed to use a CPAP or BPAP mask, make sure you use it nightly as directed. Make any lifestyle changes that your health care provider recommends. If you were given a device to open your airway while you sleep, use it only as told by your health care provider. Do not use any tobacco products, such as cigarettes, chewing tobacco, and e-cigarettes. If you need help quitting, ask your health care provider. Keep all follow-up visits as told by your health care provider. This is important. Summary A sleep study (polysomnogram) is a series of tests done while you are sleeping. It shows how well you sleep. Most sleep studies are done over one full night of sleep. You will arrive at the study center in the evening and go home in the morning. If you have signs of the sleep disorder called sleep apnea during your test, you may get a treatment mask to wear for the second half of the night. A medical doctor who specializes in sleep will evaluate the results of your sleep study and share them with your primary health care provider. This information is not intended to replace advice given to you by your health care provider. Make sure you discuss any questions you have with your health care provider. Document Revised: 04/01/2021 Document Reviewed: 09/20/2017 [employer] Patient Education  [employer].

## 2021-08-24 NOTE — Progress Notes (Signed)
SLEEP MEDICINE CLINIC    Provider:  Larey Seat, MD  Primary Care Physician:  [name], MD [address] [name] [address]     Referring Provider: [name], Md [place],  [place] [address]   Dr. Terri Skains, DO, cardiology.  Dr. Posey Pronto, MD, Nephrology.          Chief Complaint according to patient   Patient presents with:     New Patient (Initial Visit)     pt alone, rm 10. presents [name] this was initial CPAP follow up apt. pt states he decided not to pursue the CPAP.  He did not bring the machine.  pt here to discuss results and if there is alternative.       HISTORY :  08-24-2021, Rv  encephalopathy. EEG normal, CPAP has not worked well for him, he didn't bring the machine with him. He reports condensation water in tubing and mask, he also uses a FFM.  [name] was diagnosed in a split-night polysomnography from 04-29-2021.  He was originally seen in referral from a hospitalist at [facility] after a stay.  Had a home sleep test in April 2021 had a 15.4 AHI at that time but strong REM dependency.  REM AHI was 37/h and he was tachycardic bradycardic.  34 minutes of hypoxemia were noted and his repeat study in lab he did have again a REM dependent apnea form and prolonged hypoxia and he has truly woken up very frequently before CPAP was initiated he slept better and more sustained when the CPAP was in place.  No added oxygen was needed CPAP at 10 cmH2O helped hypoxia" his apnea he was sleeping best in a reclined position with the chest and head of bed elevated. He is not a loud snorer, and reports improved nasal patency. Marland Kitchen  MMSE [name] 28/ 30 points.    12-22-2020: [name] is a 65 y.o. year old biracial male patient seen on 12/22/2020 , upon referral by HOSPITALIST. Chief concern according to patient :  I have the pleasure of meeting with [name] who is an established sleep patient in my practice but [name]'s visit was arranged by  the hospitalist at [facility] after a stay in hospital between the 15th and 21 November 2020.  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  He was mildly hypoxic and his chest x-ray had demonstrated vascular congestion he was also febrile he had tested positive for COVID-19 in early February about 6 weeks earlier neurology was consulted to evaluate for encephalopathy and treated him empirically for meningitis encephalitis and LP was obtained that showed 1 red blood cell 1 white blood cells elevated glucose abnormal protein and MRI brain was unremarkable except for some small vessel disease or white matter disease which can be age-related and certainly will affect a sarcoidosis patient more as well.  Blood cultures have grown in 1 of 4 specimens culture-+with Staph epidermidis which was suspected to be a contamination and also MSSA which was treated as a true infection.  To rule out a pulmonary embolism a VQ scan was a negative and empiric heparin was to discontinued the LP was not consistent with meningitis.  His mental status normalized with antibiotic therapy.  So the patient has a history of gram-positive bacteremia, or sarcoidosis which may not affect just the lung but also the brain, essential hypertension chronic kidney disease, tobacco abuse, obstructive sleep apnea in the past, currently not treated with CPAP.  Acute encephalopathy, acute renal failure, acute respiratory failure.   He was seen by his primary care physician Dr. [name], I noticed that he had been given  oxycodone after his shoulder surgery which may also impair his mental status and cognition, he was given Zofran in case of nausea, his hypertension was treated with hydralazine labetalol losartan, he is on Lipitor T of thiazide and Flonase he took took an aspirin daily.  Xanax as needed and albuterol inhaler as needed he was every day on Deltasone 5 mg.  He is also on trazodone at night for sleep.   Ammonia was normal one of his liver function test was severely elevated the other 1 was normal bilirubin was normal albumin was low which could be related to 3 days of nausea and not eating well.  He was also worked up as a code stroke because of his neuro deficit and expressive aphasia moderate patchy and ill-defined hypoattenuation within the cerebral white matter chronic small vessel disease no hemorrhage no acute stroke was found.  CT angiogram without stenosis at least not with at this present hemorrhage significantly stenosis.  The carotid arteries were patent, the patient has recovered since he was treated with antibiotics and has felt better, I have not followed him for sleep apnea since April last year when he had a repeat home sleep test and had a strongly REM sleep dependent sleep apnea.   He was overall mild with only 15.5 apneas and hypopneas per hour of sleep.  Given that the patient was intermittently well treated with a narcotic for pain control after surgery it is very well positive that it induced additional respiratory dysfunction.       05-05-2020; I meeting [name] with [name]. [name], all who underwent a home sleep test on December 10, 2019 his past medical history as stated below, he had mild sleep apnea with an AHI of 15.5 but this was strongly REM sleep dependent REM apnea-hypopnea index rose to 37/h he was borderline tachycardia bradycardic there was no positional component there were 34 minutes of hypoxemia time noted 8% of the recorded sleep time.  Based on this data CPAP is actually  the only treatment option hypoxemia cannot be corrected by a dental device or inspire device, neither REM dependent apnea.  The patient has decided not to pursue CPAP but there is really no alternative for this form of apnea.  He still has trouble to initiate sleep, but this is not related to an organic sleep disorder. Also home sleep test he fell asleep within 30 minutes.  He stayed asleep for much of the night there was 1 wakefulness around 1 AM and another 1 around 430 AM after which sleep became more fragmented.   HISTORY OF PRESENT ILLNESS:  [name] is a 65 y.o. year old biracial male patient seen on 08/24/2021 , upon referral by dr Virgina Jock. Chief concern according to patient :  " I don't think I have sleep apnea" .   I have the pleasure of seeing [name]  [name], a right -handed male with a possible sleep disorder. He   has a past medical history of Back pain, CKD (chronic kidney disease), stage III,  Diastolic dysfunction, ED (erectile dysfunction), GERD (gastroesophageal reflux disease), Hypercalcemia, Hyperlipidemia, renal Hypertension,  Long-term use of high-risk medication- including opioids, postsurgical delirium,  Nephrocalcinosis-lithiasis, Obesity, Osteopenia, Pancreatitis, PE (pulmonary embolism), Proteinuria, Sarcoidosis, and former smoker.  Dr. 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  Severe gallstone pancreatitis in July 2002 laparoscopic cholecystectomy in December 2002 and a tracheostomy at the time.  He has a history of recurrent retroperitoneal abscesses, osteopenia epididymitis, he has microalbuminuria proteinuria and CKD stage III.  His urologist is Dr. Ruby Cola, he had a kidney biopsy in June 2011.   Sleep relevant medical history: Nocturia/:  4-5 times, sleep walker in childhood, mother was too. Tonsillectomy at age 84, multiple  spine surgery, no deviated septum / no UPPP.    Family medical /sleep history: mother was sleep walking .    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   and lives in a household with 7 persons- with spouse, daughter and 4 grandchildren.  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 . Tobacco use- quit 10 years ago .  ETOH use : rare , Caffeine intake in form of Coffee(  Decaffeinated ) Soda( less than one a day ) Tea ( avoids ) or energy drinks. Regular exercise in form of walking    Hobbies : " Grandchildren"   Sleep habits are as follows:  The patient's dinner time is between 6 PM. The patient goes to bed at 10 PM where he falls asleep at about 12 midnight. He watches TV some evenings in the bedroom.   Once asleep he continues to sleep for 2 hours, wakes for 4-5 bathroom breaks, the first time at 2 AM.   The preferred sleep position is supine , with the support of 2 pillows.  Dreams are reportedly infrequent.  5-6 AM is the usual rise time. The patient wakes up spontaneously. He reports not feeling refreshed or restored in AM, with symptoms such as dry mouth , but no morning headaches, mainly residual fatigue.  Naps are taken infrequently, lasting from 30 minutes and are more refreshing than nocturnal sleep.    Review of Systems: Out of a complete 14 system review, the patient complains of only the following symptoms, and all other reviewed systems are negative.:  Fatigue, sleepiness , snoring, fragmented sleep, Insomnia , sleep hygiene -    How likely are you to doze in the following situations: 0 = not likely, 1 = slight chance, 2 = moderate chance, 3 = high chance   Sitting and Reading? Watching Television? Sitting inactive in a public place (theater or meeting)? As a passenger in a car for an  hour without a break? Lying down in the afternoon when circumstances permit? Sitting and talking to someone? Sitting quietly after lunch without alcohol? In a car, while stopped for a few minutes in traffic?   Total =13 / 24 points he struggles with not falling asleep now- that is new. 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   Higher than before.   he can't stand using CPAP-  yet - has not spoken to DME or use about the difficulties, has not brought his machine.   Dawna Part was not recommended as first line treatment because of REM dependent. He never has had a CPAP titration, he didn't follow up.   Social History   Socioeconomic History   Marital status: Married    Spouse name: Jatavious Peppard   Number of children: 2   Years of education: Not on file   Highest education level: Associate degree: academic program  Occupational History   Not on file  Tobacco Use   Smoking status: Former    Packs/day: 0.25    Years: 15.00    Pack years: 3.75    Types: Cigarettes    Start date: 09/06/2005    Quit date: 02/23/2021    Years since quitting: 0.4   Smokeless tobacco: Never  Vaping Use   Vaping Use: Never used  Substance and Sexual Activity   Alcohol use: No    Alcohol/week: 0.0 standard drinks   Drug use: No   Sexual activity: Yes  Other Topics Concern   Not on file  Social History Narrative   Not on file   Social Determinants of Health   Financial Resource Strain: Low Risk    Difficulty of Paying Living Expenses: Not very hard  Food Insecurity: Not on file  Transportation Needs: No Transportation Needs   Lack of Transportation (Medical): No   Lack of Transportation (Non-Medical): No  Physical Activity: Not on file  Stress: Not on file  Social Connections: Not on file    Family History  Problem Relation Age of Onset   Hypertension Father    CVA Father    Lung cancer Father    Alzheimer's  disease Mother    Hypertension Sister    Heart failure Sister     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   ED (erectile dysfunction)    GERD (gastroesophageal reflux disease)    Hypercalcemia    Hyperlipidemia    Hypertension    Insomnia    Long-term use of high-risk medication    Nephrocalcinosis    Nephrolithiasis    Obesity    Osteopenia    Pancreatitis    admitted 03/19/01-06/05/01   PE (pulmonary embolism) 01/30/2013   Proteinuria    Sarcoidosis    Sleep apnea    Smoker    Stroke [facility])     Past Surgical History:  Procedure Laterality Date   ABDOMINAL EXPLORATION SURGERY     BACK SURGERY     CHOLECYSTECTOMY     RIGHT/LEFT HEART CATH AND CORONARY ANGIOGRAPHY N/A 05/12/2021   Procedure: RIGHT/LEFT HEART CATH AND CORONARY ANGIOGRAPHY;  Surgeon: [name], MD;  Location: [facility];  Service: Cardiovascular;  Laterality: N/A;   SHOULDER ARTHROSCOPY Right 10/30/2020   Procedure: ARTHROSCOPY SHOULDER WITH EXTENSIVE DEBRIDEMENT;  Surgeon: Mcarthur Rossetti, MD;  Location: [facility];  Service: Orthopedics;  Laterality: Right;   SHOULDER SURGERY     TRACHEOSTOMY     closed     Current  Outpatient Medications on File Prior to Visit  Medication Sig Dispense Refill   aspirin 81 MG tablet Take 1 tablet (81 mg total) by mouth daily.     atorvastatin (LIPITOR) 20 MG tablet TAKE 1 TABLET BY MOUTH EVERY DAY 90 tablet 3   bumetanide (BUMEX) 1 MG tablet Take 1 tablet (1 mg total) by mouth daily. 90 tablet 0   cefdinir (OMNICEF) 300 MG capsule Take 1 capsule by mouth twice daily for 10 days. 20 capsule 0   dapagliflozin propanediol (FARXIGA) 10 MG TABS tablet Take 1 tablet (10 mg total) by mouth daily. 90 tablet  3   diltiazem (TIADYLT ER) 360 MG 24 hr capsule TAKE ONE CAPSULE BY MOUTH EVERY DAY 90 capsule 3   diltiazem (TIAZAC) 360 MG 24 hr capsule Take 360 mg by mouth daily.     fluticasone (FLONASE) 50 MCG/ACT nasal spray Place 2 sprays into both nostrils daily as needed for allergies or rhinitis.     hydrALAZINE (APRESOLINE) 100 MG tablet Take 1 tablet (100 mg total) by mouth 3 (three) times daily. 90 tablet 3   isosorbide mononitrate (IMDUR) 30 MG 24 hr tablet Take 3 tablets (90 mg total) by mouth daily. 90 tablet 6   metoprolol (TOPROL-XL) 200 MG 24 hr tablet Take 1 tablet by mouth once daily 90 tablet 3   oseltamivir (TAMIFLU) 75 MG capsule Take 1 capsule by mouth 2 times a day for 5 days 10 capsule 0   potassium chloride SA (KLOR-CON) 20 MEQ tablet Take 1 tablet (20 mEq total) by mouth daily. 90 tablet 0   predniSONE (DELTASONE) 10 MG tablet 40 mg oral daily, decrease by 10 mg every 5 days. (Patient taking differently: Take 5 mg by mouth daily with breakfast.) 45 tablet 0   predniSONE (DELTASONE) 5 MG tablet Take 1 tablet by mouth once daily. 90 tablet 3   promethazine-dextromethorphan (PROMETHAZINE-DM) 6.25-15 MG/5ML syrup Take 5 mL by mouth as needed every 6 hours for up to 7 days as needed for  cough. Caution sedation. 140 mL 0   sacubitril-valsartan (ENTRESTO) 97-103 MG Take 1 tablet by mouth 2 (two) times daily. 60 tablet 3   tamsulosin (FLOMAX) 0.4 MG CAPS capsule Take 0.4 mg by mouth daily.     tamsulosin (FLOMAX) 0.4 MG CAPS capsule Take 1 capsule by mouth daily - need appointment for more refills 30 capsule 6   traZODone (DESYREL) 150 MG tablet Take 1 tablet (150 mg total) by mouth at bedtime as needed for sleep. 90 tablet 3   traZODone (DESYREL) 150 MG tablet Take 1 tablet (150 mg total) by mouth at bedtime. 90 tablet 0   No current facility-administered medications on file prior to visit.    Allergies  Allergen Reactions   Codeine Nausea Only   Hydrochlorothiazide     Unknown    Hydromorphone Other (See Comments)    hallucinations     Physical exam:  [name]'s Vitals   08/24/21 0806  BP: 130/85  Pulse: 72  Weight: 224 lb 8 oz (101.8 kg)  Height: 6' (1.829 m)   Body mass index is 30.45 kg/m.   Wt Readings from Last 3 Encounters:  08/24/21 224 lb 8 oz (101.8 kg)  06/10/21 230 lb (104.3 kg)  06/10/21 230 lb 9.6 oz (104.6 kg)     Ht Readings from Last 3 Encounters:  08/24/21 6' (1.829 m)  06/10/21 6' (1.829 m)  06/10/21 6' (1.829 m)      General: The patient  is awake, alert and appears not in acute distress.  Head: Normocephalic, atraumatic. Neck is supple. Mallampati 4,  neck circumference:18 inches . Nasal airflow is patent.  Retrognathia is seen.   Cardiovascular:  Regular rate and cardiac rhythm by pulse,  without distended neck veins. Respiratory: Lungs are clear to auscultation.  Skin:  Without evidence of ankle edema, or rash.  Trunk: The patient's posture is stooped.    Neurologic exam : The patient is awake and alert, oriented to place and time.   Memory subjective described as intact. His wife feels he is much more forgetful, and has  slowed word retrieving.  mini-mental status exam   Montreal Cognitive Assessment  12/22/2020  Visuospatial/ Executive (0/5) 1  Naming (0/3) 3  Attention: Read list of digits (0/2) 2  Attention: Read list of letters (0/1) 1  Attention: Serial 7 subtraction starting at 100 (0/3) 3  Language: Repeat phrase (0/2) 1  Language : Fluency (0/1) 0  Abstraction (0/2) 2  Delayed Recall (0/5) 1  Orientation (0/6) 5  Total 19   MMSE - Mini Mental State Exam 08/24/2021 12/22/2020  Orientation to time 4 5  Orientation to Place 5 4  Registration 3 3  Attention/ Calculation 4 5  Recall 3 2  Language- name 2 objects 2 2  Language- repeat 1 1  Language- follow 3 step command 3 3  Language- read & follow direction 1 1  Write a sentence 1 1  Copy design 1 1  Total score 28 28         Attention span &  concentration ability appears improved. Speech is fluent, with dysarthria,mild dysphonia / no witnessed aphasia.  Mood and affect are depressed ,   CN : no loss of smell or taste, Pupil equal, left ptosis, full EOM and accomodation.  Facial  ( lower) symmetry otherwise.  He has inability to lift the left eyebrow-   Bells palsy on the left.  Normal Neck and shoulder ROM.    Normal muscle tone. No cogwheeling.  No handwriting changes. No postural gait change, he has shuffling gait and walks much slower.  His left arm did not produce any arm swing and he kept his left hand fisted by walking he turns his 4 steps and previously turns with 3 steps.  His right foot is everted pointing outwards and he could not tiptoe on his right foot.  There is some weakness in the right lower extremity.  His rotator cuff surgery has brought some positive results and he has normal arm swing on that side - he is right hand -dominant.   Assessment :  highgrade upper airway narrowing , has had uncontrolled hypertension. He has been the same as he was in 10-2019.  He does have multiple medical risk factors for the presence of sleep apnea including a previous tracheostomy, high hypoxemia risk because of pulmonary embolism history and former smoking, history of heart disease and CKD stage III.   There is a questionable history of CVA in 2014 that I could not verify.  Multiple surgeries.  In addition to sleep initiation insomnia that has been sleep fragmentation by nocturia and sleep apnea could be a cause for frequent nocturnal bathroom break    After spending a total time of 20 minutes face to face including time for physical and neurologic examination, review of laboratory studies, personal review of imaging studies, reports and results of other testing and review of referral information / records as far as provided  in visit, I have established the following assessments:  1) Mr. Hiemstra has fully recovered from his   Bell's palsy on the right,  2) he had Encephalopathy, fully recovered. A stroke was not found, but he had pulmonary edema, had mental status changes, had abnormal labs, and one out of 4 CSF cultures was positive.   3)He has now  followed up on his PSG and did not bring his machine.   4) quit smoking - congratulations. .   CPAP will be continued with a mask of his choice, reduced heated humiditity, in form of an auto CPAP , I had not data to change settings based on AHI>     My Plan is to proceed with:  1) Patient will need to see Np in RV and bring his machine -  he will first get the DME to download and fax results to [place]. Rv with NP in 3 months. I would like to thank [name], MD , [address] [place],  [place] [address] for allowing me to meet with his patient.   CC: I will share my notes with Dr Virgina Jock , Dr. Jeffie Pollock , Dr Posey Pronto.   Electronically signed by: Larey Seat, MD 08/24/2021 8:44 AM  [facility] Board certified by The [employer] of Sleep Medicine and Diplomate of the [employer] of Sleep Medicine. Board certified In Neurology through the [employer], Fellow of the [employer] of Neurology. Medical Director of [facility].

## 2021-08-27 ENCOUNTER — Ambulatory Visit
Admission: RE | Admit: 2021-08-27 | Discharge: 2021-08-27 | Disposition: A | Discharge status: Home / Self Care | Payer: PPO | Source: Ambulatory Visit | Attending: Internal Medicine | Admitting: Internal Medicine

## 2021-08-27 ENCOUNTER — Other Ambulatory Visit: Payer: Self-pay

## 2021-08-27 DIAGNOSIS — R911 Solitary pulmonary nodule: Secondary | ICD-10-CM | POA: Diagnosis not present

## 2021-08-27 DIAGNOSIS — I700 Atherosclerosis of aorta: Secondary | ICD-10-CM | POA: Diagnosis not present

## 2021-08-30 ENCOUNTER — Other Ambulatory Visit: Payer: Self-pay | Admitting: Cardiology

## 2021-08-30 ENCOUNTER — Other Ambulatory Visit (HOSPITAL_COMMUNITY): Payer: Self-pay

## 2021-09-01 ENCOUNTER — Other Ambulatory Visit (HOSPITAL_COMMUNITY): Payer: Self-pay

## 2021-09-01 MED ORDER — POTASSIUM CHLORIDE CRYS ER 20 MEQ PO TBCR
20.0000 meq | EXTENDED_RELEASE_TABLET | Freq: Every day | ORAL | 0 refills | Status: DC
  Filled 2021-09-01: qty 90, 90d supply, fill #0

## 2021-09-08 DIAGNOSIS — E785 Hyperlipidemia, unspecified: Secondary | ICD-10-CM | POA: Diagnosis not present

## 2021-09-08 DIAGNOSIS — R809 Proteinuria, unspecified: Secondary | ICD-10-CM | POA: Diagnosis not present

## 2021-09-08 DIAGNOSIS — G4700 Insomnia, unspecified: Secondary | ICD-10-CM | POA: Diagnosis not present

## 2021-09-08 DIAGNOSIS — L84 Corns and callosities: Secondary | ICD-10-CM | POA: Diagnosis not present

## 2021-09-08 DIAGNOSIS — I700 Atherosclerosis of aorta: Secondary | ICD-10-CM | POA: Diagnosis not present

## 2021-09-08 DIAGNOSIS — I129 Hypertensive chronic kidney disease with stage 1 through stage 4 chronic kidney disease, or unspecified chronic kidney disease: Secondary | ICD-10-CM | POA: Diagnosis not present

## 2021-09-08 DIAGNOSIS — R7301 Impaired fasting glucose: Secondary | ICD-10-CM | POA: Diagnosis not present

## 2021-09-08 DIAGNOSIS — N1831 Chronic kidney disease, stage 3a: Secondary | ICD-10-CM | POA: Diagnosis not present

## 2021-09-08 DIAGNOSIS — I5032 Chronic diastolic (congestive) heart failure: Secondary | ICD-10-CM | POA: Diagnosis not present

## 2021-09-08 DIAGNOSIS — D692 Other nonthrombocytopenic purpura: Secondary | ICD-10-CM | POA: Diagnosis not present

## 2021-09-08 DIAGNOSIS — E669 Obesity, unspecified: Secondary | ICD-10-CM | POA: Diagnosis not present

## 2021-09-08 DIAGNOSIS — M8580 Other specified disorders of bone density and structure, unspecified site: Secondary | ICD-10-CM | POA: Diagnosis not present

## 2021-09-09 ENCOUNTER — Other Ambulatory Visit (HOSPITAL_COMMUNITY): Payer: Self-pay

## 2021-09-09 DIAGNOSIS — Z85828 Personal history of other malignant neoplasm of skin: Secondary | ICD-10-CM | POA: Diagnosis not present

## 2021-09-09 DIAGNOSIS — D225 Melanocytic nevi of trunk: Secondary | ICD-10-CM | POA: Diagnosis not present

## 2021-09-09 DIAGNOSIS — D2361 Other benign neoplasm of skin of right upper limb, including shoulder: Secondary | ICD-10-CM | POA: Diagnosis not present

## 2021-09-09 DIAGNOSIS — L300 Nummular dermatitis: Secondary | ICD-10-CM | POA: Diagnosis not present

## 2021-09-09 DIAGNOSIS — L821 Other seborrheic keratosis: Secondary | ICD-10-CM | POA: Diagnosis not present

## 2021-09-09 MED ORDER — TRIAMCINOLONE ACETONIDE 0.1 % EX CREA
TOPICAL_CREAM | CUTANEOUS | 2 refills | Status: AC
  Filled 2021-09-09: qty 80, 30d supply, fill #0

## 2021-09-10 DIAGNOSIS — N2581 Secondary hyperparathyroidism of renal origin: Secondary | ICD-10-CM | POA: Diagnosis not present

## 2021-09-10 DIAGNOSIS — I129 Hypertensive chronic kidney disease with stage 1 through stage 4 chronic kidney disease, or unspecified chronic kidney disease: Secondary | ICD-10-CM | POA: Diagnosis not present

## 2021-09-12 ENCOUNTER — Other Ambulatory Visit (HOSPITAL_COMMUNITY): Payer: Self-pay

## 2021-09-21 ENCOUNTER — Encounter: Payer: Self-pay | Admitting: Student

## 2021-09-21 ENCOUNTER — Ambulatory Visit: Payer: PPO | Admitting: Cardiology

## 2021-09-21 ENCOUNTER — Other Ambulatory Visit: Payer: Self-pay

## 2021-09-21 ENCOUNTER — Ambulatory Visit: Payer: PPO | Admitting: Student

## 2021-09-21 VITALS — BP 136/84 | HR 65 | Temp 97.8°F | Resp 16 | Ht 72.0 in | Wt 219.0 lb

## 2021-09-21 DIAGNOSIS — I4729 Other ventricular tachycardia: Secondary | ICD-10-CM

## 2021-09-21 DIAGNOSIS — I10 Essential (primary) hypertension: Secondary | ICD-10-CM

## 2021-09-21 DIAGNOSIS — I5032 Chronic diastolic (congestive) heart failure: Secondary | ICD-10-CM

## 2021-09-21 NOTE — Progress Notes (Signed)
ID:  [name], DOB 01-24-1956, MRN [id number]  PCP:  [name], MD  Cardiologist: Rex Kras, DO, [facility] (established care 12/10/2020)  Date: 09/21/21 Last Office Visit: 04/30/2021  Chief Complaint  Patient presents with   Congestive Heart Failure   Follow-up    3 month    HPI  [name] is a 66 y.o. male who presents to the office with a chief complaint of " heart failure management." Patient's past medical history and cardiovascular risk factors include: Sarcoidosis, HTN, Tobacco abuse, Hx of PE (was on Xarelto for 1 year), OSA not on CPAP, MSSA Bacteremia (March 2022), Hx of cardiac arrest (2002 during his hospitalization for pancreatitis per wife).  He is referred to the office at the request of [name], MD for evaluation of heart failure.  In the recent past patient has had multiple episodes of hospitalizations for shortness of breath/heart failure exacerbation.  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  In addition, during his hospitalization he was noted to have NSVT's and frequent PVCs on monitor despite being on beta-blockers and calcium channel blockers.  He underwent angiography in September 2022 results were reviewed with him in great detail and noted below for further reference.  At last visit increased Entresto from 97/23 mg half a tablet to a full tablet twice daily. Unfortunately repeat BMP has not been done until this morning, results are pending.  He is tolerating increased Entresto dose without issue.  He is without specific complaints today.  He does however admit to some dietary indiscretion and notices on home monitoring that his blood pressure is high after eating out.  In fact he ate out 3 times over the last 2 days.  He is taking Bumex 1 mg p.o. daily, has not needed extra diuresis. His weight has trended down an additional 5  lbs and he is euvolemic on exam.   FUNCTIONAL STATUS: No structured exercise program or daily routine.   ALLERGIES: Allergies  Allergen Reactions   Codeine Nausea Only   Hydrochlorothiazide     Unknown   Hydromorphone Other (See Comments)    hallucinations     MEDICATION LIST PRIOR TO VISIT: Current Meds  Medication Sig   aspirin 81 MG tablet Take 1 tablet (81 mg total) by mouth daily.   atorvastatin (LIPITOR) 20 MG tablet Take 1 tablet by mouth daily.   bumetanide (BUMEX) 1 MG tablet Take 1 tablet (1 mg total) by mouth daily.   dapagliflozin propanediol (FARXIGA) 10 MG TABS tablet Take 1 tablet (10 mg total) by mouth daily.   diltiazem (TIAZAC) 360 MG 24 hr capsule Take 360 mg by mouth daily.   fluticasone (FLONASE) 50 MCG/ACT nasal spray Place 2 sprays into both nostrils daily as needed for allergies or rhinitis.   hydrALAZINE (APRESOLINE) 100 MG tablet Take 1 tablet (100 mg total) by mouth 3 (three) times daily.   isosorbide mononitrate (IMDUR) 30 MG 24 hr tablet Take 3 tablets (90 mg total) by mouth daily.   metoprolol (TOPROL-XL) 200 MG 24 hr tablet Take 1 tablet by mouth once daily   potassium chloride SA (KLOR-CON M) 20 MEQ tablet Take 1 tablet  by mouth daily.   predniSONE (DELTASONE) 10 MG tablet 40 mg oral daily, decrease by 10 mg every 5 days. (Patient taking differently: Take 5 mg by mouth daily with breakfast.)  predniSONE (DELTASONE) 5 MG tablet Take 1 tablet by mouth once daily.   sacubitril-valsartan (ENTRESTO) 97-103 MG Take 1 tablet by mouth 2 (two) times daily.   tamsulosin (FLOMAX) 0.4 MG CAPS capsule Take 0.4 mg by mouth daily.   tamsulosin (FLOMAX) 0.4 MG CAPS capsule Take 1 capsule by mouth daily - need appointment for more refills   traZODone (DESYREL) 150 MG tablet Take 1 tablet (150 mg total) by mouth at bedtime as needed for sleep.   traZODone (DESYREL) 150 MG tablet Take 1 tablet (150 mg total) by mouth at bedtime.   triamcinolone cream (KENALOG) 0.1 %  Apply to affected areas twice a day as needed for itching/inflammation.   [DISCONTINUED] atorvastatin (LIPITOR) 20 MG tablet TAKE 1 TABLET BY MOUTH EVERY DAY   [DISCONTINUED] diltiazem (TIADYLT ER) 360 MG 24 hr capsule TAKE ONE CAPSULE BY MOUTH EVERY DAY     PAST MEDICAL HISTORY: 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   ED (erectile dysfunction)    GERD (gastroesophageal reflux disease)    Hypercalcemia    Hyperlipidemia    Hypertension    Insomnia    Long-term use of high-risk medication    Nephrocalcinosis    Nephrolithiasis    Obesity    Osteopenia    Pancreatitis    admitted 03/19/01-06/05/01   PE (pulmonary embolism) 01/30/2013   Proteinuria    Sarcoidosis    Sleep apnea    Smoker    Stroke [facility])   Patient and wife denies hx of CVA.   PAST SURGICAL HISTORY: Past Surgical History:  Procedure Laterality Date   ABDOMINAL EXPLORATION SURGERY     BACK SURGERY     CHOLECYSTECTOMY     RIGHT/LEFT HEART CATH AND CORONARY ANGIOGRAPHY N/A 05/12/2021   Procedure: RIGHT/LEFT HEART CATH AND CORONARY ANGIOGRAPHY;  Surgeon: [name], MD;  Location: [facility];  Service: Cardiovascular;  Laterality: N/A;   SHOULDER ARTHROSCOPY Right 10/30/2020   Procedure: ARTHROSCOPY SHOULDER WITH EXTENSIVE DEBRIDEMENT;  Surgeon: Mcarthur Rossetti, MD;  Location: [facility];  Service: Orthopedics;  Laterality: Right;   SHOULDER SURGERY     TRACHEOSTOMY     closed    FAMILY HISTORY: The patient family history includes Alzheimer's disease in his mother; CVA in his father; Heart failure in his sister; Hypertension in his father and sister; Lung cancer in his father.  SOCIAL HISTORY:  The patient  reports that he quit smoking  about 6 months ago. His smoking use included cigarettes. He started smoking about 16 years ago. He has a 3.75 pack-year smoking history. He has never used smokeless tobacco. He reports that he does not drink alcohol and does not use drugs.  REVIEW OF SYSTEMS: Review of Systems  Constitutional: Positive for weight loss. Negative for chills, fever, malaise/fatigue and weight gain.  HENT:  Negative for hoarse voice and nosebleeds.   Eyes:  Negative for discharge, double vision and pain.  Cardiovascular:  Negative for chest pain, claudication, dyspnea on exertion, leg swelling, near-syncope, orthopnea, palpitations, paroxysmal nocturnal dyspnea and syncope.  Respiratory:  Negative for hemoptysis and shortness of breath.   Musculoskeletal:  Negative for muscle cramps and myalgias.  Gastrointestinal:  Negative for abdominal pain, constipation, diarrhea, hematemesis, hematochezia, melena, nausea and vomiting.  Neurological:  Negative for dizziness and light-headedness.   PHYSICAL EXAM: Vitals with BMI 09/21/2021 09/21/2021 09/21/2021  Height - - _0   Weight - - 219 lbs  BMI - - 65.0  Systolic 354 656 812  Diastolic 84 96 93  Pulse - 65 64    CONSTITUTIONAL: Appears older than stated age, well-developed and well-nourished. No acute distress.  SKIN: Skin is warm and dry. No rash noted. No cyanosis. No pallor. No jaundice HEAD: Normocephalic and atraumatic.  EYES: No scleral icterus MOUTH/THROAT: Moist oral membranes.  NECK: No JVD present. No thyromegaly noted. No carotid bruits  LYMPHATIC: No visible cervical adenopathy.  CHEST Normal respiratory effort. No intercostal retractions  LUNGS: Clear to auscultation bilaterally.  No stridor. No wheezes. No rales.  CARDIOVASCULAR: Regular, positive X5-T7, soft holosystolic murmur heard at the apex, no gallops or rubs. ABDOMINAL: Obese, soft soft, nontender, distended, positive bowel sounds in all 4 quadrants, no apparent ascites.  EXTREMITIES:  Trace bilateral pitting edema, no open wounds or ulcers.   HEMATOLOGIC: No significant bruising NEUROLOGIC: Oriented to person, place, and time. Nonfocal. Normal muscle tone.  PSYCHIATRIC: Normal mood and affect. Normal behavior. Cooperative Physical exam unchanged compared to previous office visit.  CARDIAC DATABASE: EKG: 12/18/2020: Sinus bradycardia, 58 bpm, left atrial enlargement, diffuse T wave inversions.  No significant change compared to prior ECG. 04/30/2021: Normal sinus rhythm, 68 bpm, left atrial enlargement, nonspecific ST-T changes, without underlying injury pattern. 09/21/2021: Sinus rhythm at a rate of 61 bpm.  Left atrial enlargement.  Nonspecific ST-T wave changes unchanged compared to 04/30/2021.  Echocardiogram: 03/23/2021: LVEF 60-65%, mild LVH, grade 1 diastolic impairment, RVSP 49 mmHg consistent with mild pulmonary hypertension, severely dilated left atrium, mild MR, mild AR,  Stress Testing: No results found for this or any previous visit from the past 1095 days.  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 If he has angina symptoms in future, could consider PCI to distal RCA.  CT chest without contrast: 04/21/2021 1. Widespread patchy nodular thickening of the peribronchovascular interstitium throughout both lungs, most prominent in the upper lobes, with associated patchy peribronchovascular ground-glass opacity. Masslike 3.7 cm focus of consolidation in the anterior right lower lobe, new. While nonspecific, these findings  most likely represent progressive pulmonary sarcoidosis. Follow-up outpatient high-resolution chest CT suggested in 3-6 months. 2. Mild cardiomegaly. Three-vessel coronary atherosclerosis. 3. Dilated main pulmonary artery, suggesting pulmonary arterial hypertension. 4. Ectatic 4.4 cm ascending thoracic aorta. Recommend annual imaging followup by CTA or MRA. This recommendation follows 2010 ACCF/AHA/AATS/ACR/ASA/SCA/SCAI/SIR/STS/SVM Guidelines for the Diagnosis and Management of Patients with Thoracic Aortic Disease. Circulation. 2010; 121: G017-C944. Aortic aneurysm NOS (ICD10-I71.9). 5. Aortic Atherosclerosis (ICD10-I70.0).  LABORATORY DATA: CBC Latest Ref Rng & Units 05/12/2021 05/12/2021 04/22/2021  WBC 4.0 - 10.5 K/uL - - 10.0  Hemoglobin 13.0 - 17.0  g/dL 11.6(L) 11.9(L) 10.2(L)  Hematocrit 39.0 - 52.0 % 34.0(L) 35.0(L) 30.8(L)  Platelets 150 - 400 K/uL - - 221    CMP Latest Ref Rng & Units 06/01/2021 05/12/2021 05/12/2021  Glucose 70 - 99 mg/dL 132(H) - -  BUN 8 - 27 mg/dL 16 - -  Creatinine 0.76 - 1.27 mg/dL 1.54(H) - -  Sodium 134 - 144 mmol/L 143 146(H) 146(H)  Potassium 3.5 - 5.2 mmol/L 3.3(L) 3.5 3.5  Chloride 96 - 106 mmol/L 103 - -  CO2 20 - 29 mmol/L 27 - -  Calcium 8.6 - 10.2 mg/dL 8.9 - -  Total Protein 6.5 - 8.1 g/dL - - -  Total Bilirubin 0.3 - 1.2 mg/dL - - -  Alkaline Phos 38 - 126 U/L - - -  AST 15 - 41 U/L - - -  ALT 0 - 44 U/L - - -    Lipid Panel     Component Value Date/Time   CHOL 111 04/09/2021 0147   TRIG 53 04/09/2021 0147   HDL 62 04/09/2021 0147   CHOLHDL 1.8 04/09/2021 0147   VLDL 11 04/09/2021 0147   LDLCALC 38 04/09/2021 0147    No components found for: NTPROBNP Recent Labs    12/15/20 0909 01/02/21 0752 01/16/21 0713 02/03/21 0717 03/06/21 0712 03/30/21 0707 04/06/21 0725 04/14/21 0712 05/06/21 0713 06/01/21 0713  PROBNP 4,473* 2,337* 1,789* 733* 1,171* 2,643* 5,653* 3,473* 3,912* 1,994*   Recent Labs    11/19/20 0233  TSH  1.247    BMP Recent Labs    04/21/21 0258 04/22/21 0149 04/23/21 0356 04/27/21 0715 05/06/21 0714 05/12/21 0810 05/12/21 0811 06/01/21 0713  NA 140 138 142 147* 146* 146* 146* 143  K 3.7 3.6 3.4* 3.9 4.0 3.5 3.5 3.3*  CL 101 101 100 107* 105  --   --  103  CO2 29 31 32 28 28  --   --  27  GLUCOSE 212* 163* 135* 123* 122*  --   --  132*  BUN 23 25* 27* 26 21  --   --  16  CREATININE 2.00* 1.95* 1.84* 1.70* 1.53*  --   --  1.54*  CALCIUM 8.7* 8.6* 9.3 9.3 8.9  --   --  8.9  GFRNONAA 37* 38* 40*  --   --   --   --   --     HEMOGLOBIN A1C Lab Results  Component Value Date   HGBA1C 5.7 (H) 04/08/2021   MPG 116.89 04/08/2021    External Labs: Collected: 12/08/2020 Creatinine 1.7 mg/dL. (Serum creatinine 1.6 mg/dL collected 07/11/2020) eGFR: 40.9 mL/min per 1.73 m Sodium 143, potassium 3.5, chloride 104, bicarb 32 Hemoglobin 12.6, hematocrit 38.2 NT proBNP 5576  IMPRESSION:    ICD-10-CM   1. Chronic heart failure with preserved ejection fraction (HFpEF) (HCC)  I50.32 EKG 12-Lead    PCV ECHOCARDIOGRAM COMPLETE    2. Essential hypertension  I10     3. NSVT (nonsustained ventricular tachycardia)  I47.29         RECOMMENDATIONS: [name] is a 66 y.o. male whose past medical history and cardiac risk factors include: Sarcoidosis, HTN, Tobacco abuse, Hx of PE (was on Xarelto for 1 year), OSA not on CPAP, MSSA Bacteremia (March 2022), Hx of cardiac arrest (2002 during his hospitalization for pancreatitis per wife).  Chronic heart failure with preserved ejection fraction (HFpEF) ([place]) Last hospitalization in August 2022. Remains euvolemic and not in congestive heart failure. Repeat BMP results  pending Tolerating current medical therapy without issue Continue Farxiga, metoprolol, BiDil, and Entresto Continue Bumex 1 mg p.o. daily Will repeat echocardiogram prior to next office visit   NSVT (nonsustained ventricular tachycardia) Currently on beta-blockers and  calcium channel blockers. Underwent an ischemic evaluation as outlined above. Continue to monitor.  Essential hypertension Office blood pressure is mildly elevated above goal.  However patient brings with him a written log of home blood pressure readings which are fairly well controlled.  Blood pressure was elevated at the end of December 2022 for several days, likely due to dietary indiscretion during the holidays. Blood pressure has since improved on monitoring. Again reiterated the importance of low-sodium diet Patient will continue to monitor closely.  Mixed hyperlipidemia Currently on atorvastatin.   He denies myalgia or other side effects. Currently managed by PCP.  OSA (obstructive sleep apnea) Managed by Dr. Brett Fairy  Sarcoidosis of lung [place]) Follows up with pulmonary medicine.  Former smoker Has stopped smoking as of September 2022. He is congratulated on his efforts. Continue to monitor.  FINAL MEDICATION LIST END OF ENCOUNTER: No orders of the defined types were placed in this encounter.     Medications Discontinued During This Encounter  Medication Reason   oseltamivir (TAMIFLU) 75 MG capsule    promethazine-dextromethorphan (PROMETHAZINE-DM) 6.25-15 MG/5ML syrup    cefdinir (OMNICEF) 300 MG capsule    atorvastatin (LIPITOR) 20 MG tablet Duplicate   diltiazem (TIADYLT ER) 360 MG 24 hr capsule Duplicate      Current Outpatient Medications:    aspirin 81 MG tablet, Take 1 tablet (81 mg total) by mouth daily., Disp: , Rfl:    atorvastatin (LIPITOR) 20 MG tablet, Take 1 tablet by mouth daily., Disp: , Rfl:    bumetanide (BUMEX) 1 MG tablet, Take 1 tablet (1 mg total) by mouth daily., Disp: 90 tablet, Rfl: 0   dapagliflozin propanediol (FARXIGA) 10 MG TABS tablet, Take 1 tablet (10 mg total) by mouth daily., Disp: 90 tablet, Rfl: 3   diltiazem (TIAZAC) 360 MG 24 hr capsule, Take 360 mg by mouth daily., Disp: , Rfl:    fluticasone (FLONASE) 50 MCG/ACT nasal  spray, Place 2 sprays into both nostrils daily as needed for allergies or rhinitis., Disp: , Rfl:    hydrALAZINE (APRESOLINE) 100 MG tablet, Take 1 tablet (100 mg total) by mouth 3 (three) times daily., Disp: 90 tablet, Rfl: 3   isosorbide mononitrate (IMDUR) 30 MG 24 hr tablet, Take 3 tablets (90 mg total) by mouth daily., Disp: 90 tablet, Rfl: 6   metoprolol (TOPROL-XL) 200 MG 24 hr tablet, Take 1 tablet by mouth once daily, Disp: 90 tablet, Rfl: 3   potassium chloride SA (KLOR-CON M) 20 MEQ tablet, Take 1 tablet  by mouth daily., Disp: 90 tablet, Rfl: 0   predniSONE (DELTASONE) 10 MG tablet, 40 mg oral daily, decrease by 10 mg every 5 days. (Patient taking differently: Take 5 mg by mouth daily with breakfast.), Disp: 45 tablet, Rfl: 0   predniSONE (DELTASONE) 5 MG tablet, Take 1 tablet by mouth once daily., Disp: 90 tablet, Rfl: 3   sacubitril-valsartan (ENTRESTO) 97-103 MG, Take 1 tablet by mouth 2 (two) times daily., Disp: 60 tablet, Rfl: 3   tamsulosin (FLOMAX) 0.4 MG CAPS capsule, Take 0.4 mg by mouth daily., Disp: , Rfl:    tamsulosin (FLOMAX) 0.4 MG CAPS capsule, Take 1 capsule by mouth daily - need appointment for more refills, Disp: 30 capsule, Rfl: 6   traZODone (DESYREL) 150 MG  tablet, Take 1 tablet (150 mg total) by mouth at bedtime as needed for sleep., Disp: 90 tablet, Rfl: 3   traZODone (DESYREL) 150 MG tablet, Take 1 tablet (150 mg total) by mouth at bedtime., Disp: 90 tablet, Rfl: 0   triamcinolone cream (KENALOG) 0.1 %, Apply to affected areas twice a day as needed for itching/inflammation., Disp: 80 g, Rfl: 2  Orders Placed This Encounter  Procedures   EKG 12-Lead   PCV ECHOCARDIOGRAM COMPLETE      There are no Patient Instructions on file for this visit.  --Continue cardiac medications as reconciled in final medication list. --Return in about 6 months (around 03/21/2022) for HF, HTN . Or sooner if needed. --Continue follow-up with your primary care physician regarding  the management of your other chronic comorbid conditions.  Patient's questions and concerns were addressed to his satisfaction. He voices understanding of the instructions provided during this encounter.   This note was created using a voice recognition software as a result there may be grammatical errors inadvertently enclosed that do not reflect the nature of this encounter. Every attempt is made to correct such errors.   Alethia Berthold, PA-C 09/21/2021, 9:05 AM Office: [phone]

## 2021-09-22 ENCOUNTER — Other Ambulatory Visit (HOSPITAL_COMMUNITY): Payer: Self-pay

## 2021-09-22 LAB — BASIC METABOLIC PANEL
BUN/Creatinine Ratio: 8 — ABNORMAL LOW (ref 10–24)
BUN: 15 mg/dL (ref 8–27)
CO2: 25 mmol/L (ref 20–29)
Calcium: 9.2 mg/dL (ref 8.6–10.2)
Chloride: 111 mmol/L — ABNORMAL HIGH (ref 96–106)
Creatinine, Ser: 1.94 mg/dL — ABNORMAL HIGH (ref 0.76–1.27)
Glucose: 117 mg/dL — ABNORMAL HIGH (ref 70–99)
Potassium: 3.8 mmol/L (ref 3.5–5.2)
Sodium: 151 mmol/L — ABNORMAL HIGH (ref 134–144)
eGFR: 38 mL/min/{1.73_m2} — ABNORMAL LOW (ref >59)

## 2021-09-22 LAB — MAGNESIUM: Magnesium: 2.3 mg/dL (ref 1.6–2.3)

## 2021-09-22 LAB — PRO B NATRIURETIC PEPTIDE: NT-Pro BNP: 1795 pg/mL — ABNORMAL HIGH (ref 0–210)

## 2021-09-23 ENCOUNTER — Other Ambulatory Visit (HOSPITAL_COMMUNITY): Payer: Self-pay

## 2021-09-23 DIAGNOSIS — G4733 Obstructive sleep apnea (adult) (pediatric): Secondary | ICD-10-CM | POA: Diagnosis not present

## 2021-09-25 ENCOUNTER — Other Ambulatory Visit (HOSPITAL_COMMUNITY): Payer: Self-pay

## 2021-09-25 ENCOUNTER — Ambulatory Visit: Payer: PPO | Admitting: Pulmonary Disease

## 2021-09-25 ENCOUNTER — Other Ambulatory Visit: Payer: Self-pay

## 2021-09-25 ENCOUNTER — Encounter: Payer: Self-pay | Admitting: Pulmonary Disease

## 2021-09-25 VITALS — BP 116/76 | HR 61 | Temp 97.7°F | Ht 70.0 in | Wt 224.4 lb

## 2021-09-25 DIAGNOSIS — J45909 Unspecified asthma, uncomplicated: Secondary | ICD-10-CM

## 2021-09-25 DIAGNOSIS — D869 Sarcoidosis, unspecified: Secondary | ICD-10-CM | POA: Diagnosis not present

## 2021-09-25 DIAGNOSIS — Z87891 Personal history of nicotine dependence: Secondary | ICD-10-CM

## 2021-09-25 MED ORDER — TRAZODONE HCL 150 MG PO TABS
ORAL_TABLET | ORAL | 3 refills | Status: DC
  Filled 2021-09-25: qty 90, 90d supply, fill #0

## 2021-09-25 MED ORDER — TRAZODONE HCL 150 MG PO TABS
ORAL_TABLET | ORAL | 0 refills | Status: DC
  Filled 2021-09-25 – 2021-09-30 (×2): qty 90, 90d supply, fill #0

## 2021-09-25 NOTE — Patient Instructions (Addendum)
Thank you for visiting Dr. Valeta Harms at [facility]. Today we recommend the following:  Return if symptoms worsen or fail to improve, for Appt to see Dr. Loanne Drilling for sarcoidosis .    Please do your part to reduce the spread of COVID-19.

## 2021-09-25 NOTE — Progress Notes (Signed)
Synopsis: Referred in September 2022 for abnormal CT by Manuel Baton, MD  Subjective:   PATIENT ID: [name] GENDER: male DOB: 02/08/1956, MRN: [id number]  Chief Complaint  Patient presents with   Follow-up    Patient is here to talk about CT    This is a 66 year old gentleman, history of CVA, hypertension, hyperlipidemia, smoker.  Has had a recent cardiac catheterization which showed mild pulmonary hypertension on his right heart cath and coronary artery disease on his left.  This was completed yesterday.  He had a Hospitalization in August.  Has a history of sarcoidosis and COPD.  Due to shortness of breath he had a CT scan of the chest which revealed mediastinal adenopathy as well as a 3.7 x 2.4 cm anterior right lower lobe peripheral based lesion.  Patient was referred today for evaluation of abnormal CT imaging.  From respiratory standpoint he is doing well with no complaints today.  OV 09/25/2021: Here today for follow-up.Patient was last seen in the office by Manuel Nettle, NP PET scan in September showed resolution of abnormality seen in the right lower lobe.  Patient had a history of sarcoidosis documented based on imaging.  No previous lung biopsy.  He initially saw me after a hospitalization.  We thought it would be best to obtain the PET before consideration for biopsy.  Patient had a repeat CT scan of the chest and December 2022.  He has areas of tree-in-bud opacities within the lung seen on this CT scan.  He also has calcified nodes within the mediastinum.  Patient still taking 2.5 mg prednisone daily.  Has been on this for many years.   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   ED (erectile dysfunction)    GERD (gastroesophageal reflux  disease)    Hypercalcemia    Hyperlipidemia    Hypertension    Insomnia    Long-term use of high-risk medication    Nephrocalcinosis    Nephrolithiasis    Obesity    Osteopenia    Pancreatitis    admitted 03/19/01-06/05/01   PE (pulmonary embolism) 01/30/2013   Proteinuria    Sarcoidosis    Sleep apnea    Smoker    Stroke [facility])      Family History  Problem Relation Age of Onset   Hypertension Father    CVA Father    Lung cancer Father    Alzheimer's disease Mother    Hypertension Sister    Heart failure Sister      Past Surgical History:  Procedure Laterality Date   ABDOMINAL EXPLORATION SURGERY     BACK SURGERY     CHOLECYSTECTOMY     RIGHT/LEFT HEART CATH AND CORONARY ANGIOGRAPHY N/A 05/12/2021   Procedure: RIGHT/LEFT HEART CATH AND CORONARY ANGIOGRAPHY;  Surgeon: Manuel Mormon, MD;  Location: [place] [facility];  Service: Cardiovascular;  Laterality: N/A;   SHOULDER ARTHROSCOPY Right 10/30/2020   Procedure: ARTHROSCOPY SHOULDER WITH EXTENSIVE DEBRIDEMENT;  Surgeon: Manuel Rossetti, MD;  Location: [place];  Service: Orthopedics;  Laterality: Right;   SHOULDER SURGERY     TRACHEOSTOMY     closed    Social History   Socioeconomic History  Marital status: Married    Spouse name: [name]   Number of children: 2   Years of education: Not on file   Highest education level: Associate degree: academic program  Occupational History   Not on file  Tobacco Use   Smoking status: Former    Packs/day: 0.25    Years: 15.00    Pack years: 3.75    Types: Cigarettes    Start date: 09/06/2005    Quit date: 02/23/2021    Years since quitting: 0.5   Smokeless tobacco: Never  Vaping Use   Vaping Use: Never used  Substance and Sexual Activity   Alcohol use: No    Alcohol/week: 0.0 standard drinks   Drug use: No   Sexual activity: Yes  Other Topics Concern   Not on file  Social History Narrative   Not on file   Social Determinants of  Health   Financial Resource Strain: Low Risk    Difficulty of Paying Living Expenses: Not very hard  Food Insecurity: Not on file  Transportation Needs: No Transportation Needs   Lack of Transportation (Medical): No   Lack of Transportation (Non-Medical): No  Physical Activity: Not on file  Stress: Not on file  Social Connections: Not on file  Intimate Partner Violence: Not on file     Allergies  Allergen Reactions   Codeine Nausea Only   Hydrochlorothiazide     Unknown   Hydromorphone Other (See Comments)    hallucinations      Outpatient Medications Prior to Visit  Medication Sig Dispense Refill   aspirin 81 MG tablet Take 1 tablet (81 mg total) by mouth daily.     atorvastatin (LIPITOR) 20 MG tablet Take 1 tablet by mouth daily.     bumetanide (BUMEX) 1 MG tablet Take 1 tablet (1 mg total) by mouth daily. 90 tablet 0   dapagliflozin propanediol (FARXIGA) 10 MG TABS tablet Take 1 tablet (10 mg total) by mouth daily. 90 tablet 3   diltiazem (TIAZAC) 360 MG 24 hr capsule Take 360 mg by mouth daily.     fluticasone (FLONASE) 50 MCG/ACT nasal spray Place 2 sprays into both nostrils daily as needed for allergies or rhinitis.     hydrALAZINE (APRESOLINE) 100 MG tablet Take 1 tablet (100 mg total) by mouth 3 (three) times daily. 90 tablet 3   isosorbide mononitrate (IMDUR) 30 MG 24 hr tablet Take 3 tablets (90 mg total) by mouth daily. 90 tablet 6   metoprolol (TOPROL-XL) 200 MG 24 hr tablet Take 1 tablet by mouth once daily 90 tablet 3   potassium chloride SA (KLOR-CON M) 20 MEQ tablet Take 1 tablet  by mouth daily. 90 tablet 0   predniSONE (DELTASONE) 10 MG tablet 40 mg oral daily, decrease by 10 mg every 5 days. (Patient taking differently: Take 5 mg by mouth daily with breakfast.) 45 tablet 0   predniSONE (DELTASONE) 5 MG tablet Take 1 tablet by mouth once daily. 90 tablet 3   sacubitril-valsartan (ENTRESTO) 97-103 MG Take 1 tablet by mouth 2 (two) times daily. 60 tablet 3    tamsulosin (FLOMAX) 0.4 MG CAPS capsule Take 0.4 mg by mouth daily.     tamsulosin (FLOMAX) 0.4 MG CAPS capsule Take 1 capsule by mouth daily - need appointment for more refills 30 capsule 6   traZODone (DESYREL) 150 MG tablet Take 1 tablet (150 mg total) by mouth at bedtime as needed for sleep. 90 tablet 3   traZODone (DESYREL) 150  MG tablet TAKE 1 TABLET AT BEDTIME 90 tablet 3   triamcinolone cream (KENALOG) 0.1 % Apply to affected areas twice a day as needed for itching/inflammation. 80 g 2   No facility-administered medications prior to visit.    Review of Systems  Constitutional:  Negative for chills, fever, malaise/fatigue and weight loss.  HENT:  Negative for hearing loss, sore throat and tinnitus.   Eyes:  Negative for blurred vision and double vision.  Respiratory:  Positive for shortness of breath. Negative for cough, hemoptysis, sputum production, wheezing and stridor.   Cardiovascular:  Negative for chest pain, palpitations, orthopnea, leg swelling and PND.  Gastrointestinal:  Negative for abdominal pain, constipation, diarrhea, heartburn, nausea and vomiting.  Genitourinary:  Negative for dysuria, hematuria and urgency.  Musculoskeletal:  Negative for joint pain and myalgias.  Skin:  Negative for itching and rash.  Neurological:  Negative for dizziness, tingling, weakness and headaches.  Endo/Heme/Allergies:  Negative for environmental allergies. Does not bruise/bleed easily.  Psychiatric/Behavioral:  Negative for depression. The patient is not nervous/anxious and does not have insomnia.   All other systems reviewed and are negative.   Objective:  Physical Exam Vitals reviewed.  Constitutional:      General: He is not in acute distress.    Appearance: He is well-developed.  HENT:     Head: Normocephalic and atraumatic.  Eyes:     General: No scleral icterus.    Conjunctiva/sclera: Conjunctivae normal.     Pupils: Pupils are equal, round, and reactive to light.  Neck:      Vascular: No JVD.     Trachea: No tracheal deviation.  Cardiovascular:     Rate and Rhythm: Normal rate and regular rhythm.     Heart sounds: Normal heart sounds. No murmur heard. Pulmonary:     Effort: Pulmonary effort is normal. No tachypnea, accessory muscle usage or respiratory distress.     Breath sounds: No stridor. No wheezing, rhonchi or rales.  Abdominal:     General: There is no distension.     Palpations: Abdomen is soft.     Tenderness: There is no abdominal tenderness.  Musculoskeletal:        General: No tenderness.     Cervical back: Neck supple.  Lymphadenopathy:     Cervical: No cervical adenopathy.  Skin:    General: Skin is warm and dry.     Capillary Refill: Capillary refill takes less than 2 seconds.     Findings: No rash.  Neurological:     Mental Status: He is alert and oriented to person, place, and time.  Psychiatric:        Behavior: Behavior normal.     Vitals:   09/25/21 1038  BP: 116/76  Pulse: 61  Temp: 97.7 F (36.5 C)  TempSrc: Oral  SpO2: 95%  Weight: 224 lb 6.4 oz (101.8 kg)  Height: 5\' 10"  (1.778 m)   95% on RA BMI Readings from Last 3 Encounters:  09/25/21 32.20 kg/m  09/21/21 29.70 kg/m  08/24/21 30.45 kg/m   Wt Readings from Last 3 Encounters:  09/25/21 224 lb 6.4 oz (101.8 kg)  09/21/21 219 lb (99.3 kg)  08/24/21 224 lb 8 oz (101.8 kg)     CBC    Component Value Date/Time   WBC 10.0 04/22/2021 0149   RBC 3.40 (L) 04/22/2021 0149   HGB 11.6 (L) 05/12/2021 0811   HCT 34.0 (L) 05/12/2021 0811   PLT 221 04/22/2021 0149   MCV 90.6 04/22/2021  0149   MCV 89.4 11/02/2015 1003   MCH 30.0 04/22/2021 0149   MCHC 33.1 04/22/2021 0149   RDW 15.7 (H) 04/22/2021 0149   LYMPHSABS 0.5 (L) 04/17/2021 1930   MONOABS 0.5 04/17/2021 1930   EOSABS 0.4 04/17/2021 1930   BASOSABS 0.0 04/17/2021 1930    Chest Imaging: 04/21/2021: CT scan of the chest. 3.7 x 2.4 cm right lower lobe peripheral based lesion, areas of patchy  nodularity and peribronchovascular thickening with his history of sarcoid. The patient's images have been independently reviewed by me.    Pulmonary Functions Testing Results: No flowsheet data found.  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 If he has angina symptoms in future, could consider PCI to distal RCA.    Assessment & Plan:     ICD-10-CM   1. Sarcoidosis  D86.9     2. Moderate asthma, unspecified whether complicated, unspecified whether persistent  J45.909     3. Former smoker  Z87.891       Discussion:  This is a 66 year old gentleman, initially seen with a peripheral 3 cm lower lobe based mass.  Follow-up imaging showed resolution of this lesion.  He has been followed by primary care for several years on low-dose prednisone.  Presumed diagnosis of sarcoidosis no previous lung biopsy.  Repeat CT imaging completed a few weeks ago revealed calcification within the mediastinal nodes and the few areas of tree-in-bud opacities.  P: We talked about being on chronic prednisone. He does have evidence of what appears to be calcified nodes in the chest which would be consistent with a history of sarcoidosis that is potentially burnt out by now. I am going to have him meet with Dr. Loanne Drilling who is a special interest in managing her sarcoid patient population.  See whether or not she thinks it would be advisable to come off of prednisone.  I think being on it for a longstanding period is  going to lead to some additional problems over time. Patient is agreeable to this plan.    Current Outpatient Medications:    aspirin 81 MG tablet, Take 1 tablet (81 mg total) by mouth daily., Disp: , Rfl:    atorvastatin (LIPITOR) 20 MG tablet, Take 1 tablet by mouth daily., Disp: , Rfl:    bumetanide (BUMEX) 1 MG tablet, Take 1 tablet (1 mg total) by mouth daily., Disp: 90 tablet, Rfl: 0   dapagliflozin propanediol (FARXIGA) 10 MG TABS tablet, Take 1 tablet (10 mg total) by mouth daily., Disp: 90 tablet, Rfl: 3   diltiazem (TIAZAC) 360 MG 24 hr capsule, Take 360 mg by mouth daily., Disp: , Rfl:    fluticasone (FLONASE) 50 MCG/ACT nasal spray, Place 2 sprays into both nostrils daily as needed for allergies or rhinitis., Disp: , Rfl:    hydrALAZINE (APRESOLINE) 100 MG tablet, Take  1 tablet (100 mg total) by mouth 3 (three) times daily., Disp: 90 tablet, Rfl: 3   isosorbide mononitrate (IMDUR) 30 MG 24 hr tablet, Take 3 tablets (90 mg total) by mouth daily., Disp: 90 tablet, Rfl: 6   metoprolol (TOPROL-XL) 200 MG 24 hr tablet, Take 1 tablet by mouth once daily, Disp: 90 tablet, Rfl: 3   potassium chloride SA (KLOR-CON M) 20 MEQ tablet, Take 1 tablet  by mouth daily., Disp: 90 tablet, Rfl: 0   predniSONE (DELTASONE) 10 MG tablet, 40 mg oral daily, decrease by 10 mg every 5 days. (Patient taking differently: Take 5 mg by mouth daily with breakfast.), Disp: 45 tablet, Rfl: 0   predniSONE (DELTASONE) 5 MG tablet, Take 1 tablet by mouth once daily., Disp: 90 tablet, Rfl: 3   sacubitril-valsartan (ENTRESTO) 97-103 MG, Take 1 tablet by mouth 2 (two) times daily., Disp: 60 tablet, Rfl: 3   tamsulosin (FLOMAX) 0.4 MG CAPS capsule, Take 0.4 mg by mouth daily., Disp: , Rfl:    tamsulosin (FLOMAX) 0.4 MG CAPS capsule, Take 1 capsule by mouth daily - need appointment for more refills, Disp: 30 capsule, Rfl: 6   traZODone (DESYREL) 150 MG tablet, Take 1 tablet (150 mg total) by mouth at bedtime as needed for  sleep., Disp: 90 tablet, Rfl: 3   traZODone (DESYREL) 150 MG tablet, TAKE 1 TABLET AT BEDTIME, Disp: 90 tablet, Rfl: 3   triamcinolone cream (KENALOG) 0.1 %, Apply to affected areas twice a day as needed for itching/inflammation., Disp: 80 g, Rfl: 2   Garner Nash, DO [facility] 09/25/2021 10:45 AM

## 2021-09-30 ENCOUNTER — Other Ambulatory Visit (HOSPITAL_COMMUNITY): Payer: Self-pay

## 2021-09-30 DIAGNOSIS — N183 Chronic kidney disease, stage 3 unspecified: Secondary | ICD-10-CM | POA: Diagnosis not present

## 2021-09-30 DIAGNOSIS — N2581 Secondary hyperparathyroidism of renal origin: Secondary | ICD-10-CM | POA: Diagnosis not present

## 2021-09-30 DIAGNOSIS — I129 Hypertensive chronic kidney disease with stage 1 through stage 4 chronic kidney disease, or unspecified chronic kidney disease: Secondary | ICD-10-CM | POA: Diagnosis not present

## 2021-09-30 DIAGNOSIS — N051 Unspecified nephritic syndrome with focal and segmental glomerular lesions: Secondary | ICD-10-CM | POA: Diagnosis not present

## 2021-09-30 DIAGNOSIS — D631 Anemia in chronic kidney disease: Secondary | ICD-10-CM | POA: Diagnosis not present

## 2021-09-30 DIAGNOSIS — I10 Essential (primary) hypertension: Secondary | ICD-10-CM | POA: Diagnosis not present

## 2021-10-02 DIAGNOSIS — H35039 Hypertensive retinopathy, unspecified eye: Secondary | ICD-10-CM | POA: Diagnosis not present

## 2021-10-02 DIAGNOSIS — Z87891 Personal history of nicotine dependence: Secondary | ICD-10-CM | POA: Diagnosis not present

## 2021-10-02 DIAGNOSIS — D860 Sarcoidosis of lung: Secondary | ICD-10-CM | POA: Diagnosis not present

## 2021-10-02 DIAGNOSIS — N2581 Secondary hyperparathyroidism of renal origin: Secondary | ICD-10-CM | POA: Diagnosis not present

## 2021-10-02 DIAGNOSIS — I25118 Atherosclerotic heart disease of native coronary artery with other forms of angina pectoris: Secondary | ICD-10-CM | POA: Diagnosis not present

## 2021-10-02 DIAGNOSIS — I130 Hypertensive heart and chronic kidney disease with heart failure and stage 1 through stage 4 chronic kidney disease, or unspecified chronic kidney disease: Secondary | ICD-10-CM | POA: Diagnosis not present

## 2021-10-02 DIAGNOSIS — Z6831 Body mass index (BMI) 31.0-31.9, adult: Secondary | ICD-10-CM | POA: Diagnosis not present

## 2021-10-02 DIAGNOSIS — Z7984 Long term (current) use of oral hypoglycemic drugs: Secondary | ICD-10-CM | POA: Diagnosis not present

## 2021-10-02 DIAGNOSIS — I5030 Unspecified diastolic (congestive) heart failure: Secondary | ICD-10-CM | POA: Diagnosis not present

## 2021-10-02 DIAGNOSIS — N183 Chronic kidney disease, stage 3 unspecified: Secondary | ICD-10-CM | POA: Diagnosis not present

## 2021-10-02 DIAGNOSIS — J449 Chronic obstructive pulmonary disease, unspecified: Secondary | ICD-10-CM | POA: Diagnosis not present

## 2021-10-02 DIAGNOSIS — I712 Thoracic aortic aneurysm, without rupture, unspecified: Secondary | ICD-10-CM | POA: Diagnosis not present

## 2021-10-03 ENCOUNTER — Other Ambulatory Visit (HOSPITAL_COMMUNITY): Payer: Self-pay

## 2021-10-06 ENCOUNTER — Other Ambulatory Visit (HOSPITAL_COMMUNITY): Payer: Self-pay

## 2021-10-07 ENCOUNTER — Other Ambulatory Visit (HOSPITAL_COMMUNITY): Payer: Self-pay

## 2021-10-07 MED ORDER — HYDRALAZINE HCL 100 MG PO TABS
100.0000 mg | ORAL_TABLET | Freq: Three times a day (TID) | ORAL | 3 refills | Status: DC
  Filled 2021-10-07: qty 90, 30d supply, fill #0
  Filled 2021-10-19 (×2): qty 90, 30d supply, fill #1
  Filled 2021-11-28 – 2021-12-13 (×2): qty 90, 30d supply, fill #2
  Filled 2022-01-15: qty 90, 30d supply, fill #3

## 2021-10-08 ENCOUNTER — Other Ambulatory Visit (HOSPITAL_COMMUNITY): Payer: Self-pay

## 2021-10-12 DIAGNOSIS — Z7984 Long term (current) use of oral hypoglycemic drugs: Secondary | ICD-10-CM | POA: Diagnosis not present

## 2021-10-12 DIAGNOSIS — I5030 Unspecified diastolic (congestive) heart failure: Secondary | ICD-10-CM | POA: Diagnosis not present

## 2021-10-12 DIAGNOSIS — I110 Hypertensive heart disease with heart failure: Secondary | ICD-10-CM | POA: Diagnosis not present

## 2021-10-14 ENCOUNTER — Ambulatory Visit: Payer: PPO | Admitting: Pulmonary Disease

## 2021-10-16 ENCOUNTER — Telehealth: Payer: Self-pay | Admitting: Student

## 2021-10-16 NOTE — Telephone Encounter (Signed)
Called patient back, he stated that he checked his BP yesterday before breakfast and it was 118/110. Patient denies missing any BP medication doses and wants know what is the next step. Should he  increase his BP medication or come in to see about medication adjustments. Please advise. Last appointment with you was 09/21/2021

## 2021-10-16 NOTE — Telephone Encounter (Signed)
Please advise patient to monitor his blood pressure once daily for the next week and call us back next week with readings. Avoid salt intake and continue current medications. His systolic BP is soft so would not increase meds right now.

## 2021-10-16 NOTE — Telephone Encounter (Signed)
Patient voiced understanding, he will call back in 1 week with BP reading.

## 2021-10-16 NOTE — Telephone Encounter (Signed)
Pt is requesting a call from nurse regarding some elevated bp readings

## 2021-10-17 ENCOUNTER — Other Ambulatory Visit (HOSPITAL_COMMUNITY): Payer: Self-pay

## 2021-10-19 ENCOUNTER — Ambulatory Visit: Payer: PPO | Admitting: Podiatry

## 2021-10-19 ENCOUNTER — Other Ambulatory Visit: Payer: Self-pay

## 2021-10-19 ENCOUNTER — Other Ambulatory Visit (HOSPITAL_COMMUNITY): Payer: Self-pay

## 2021-10-19 DIAGNOSIS — S91209S Unspecified open wound of unspecified toe(s) with damage to nail, sequela: Secondary | ICD-10-CM | POA: Diagnosis not present

## 2021-10-19 DIAGNOSIS — Q828 Other specified congenital malformations of skin: Secondary | ICD-10-CM | POA: Diagnosis not present

## 2021-10-19 NOTE — Patient Instructions (Signed)
Salicylic acid pads or ointment can be bought on [employer] for the corn, apply daily for a week at a time, may need to take breaks in between applications

## 2021-10-19 NOTE — Progress Notes (Signed)
°  Subjective:  Patient ID: [name], male    DOB: 1955/11/18,  MRN: [id number]  Chief Complaint  Patient presents with   Nail Problem    Bilateral great toenails are thick    66 y.o. male presents with the above complaint. History confirmed with patient. Left great toenail was pulled off last year and has been growing back slowly, not particularly painful. Also has a continued corn on the right hallux  Objective:  Physical Exam: warm, good capillary refill, no trophic changes or ulcerative lesions, normal DP and PT pulses, and normal sensory exam. Left Foot: hallux nail dystrophic and growing back slowly Right Foot: hallux corn plantar    Assessment:  No diagnosis found.   Plan:  Patient was evaluated and treated and all questions answered.  Discussed mechanical etiology and treatment options of the corn and callus.  Discussed with him he can begin to apply salicylic acid ointment or cream or pads to see if this alleviates it.  He has been using the urea cream.  Debrided the lesion as a courtesy today.  Left hallux nail shows significant dystrophy from prior traumatic avulsion.  Seems to be growing slowly.  Not currently tender or painful.  Discussed that I would allow the nail to continue to grow if not improving within 4 to 5 months we will consider total nail avulsion to see if this would alleviate it and allow it to regrow completely.  Return in about 5 months (around 03/18/2022).

## 2021-10-22 ENCOUNTER — Other Ambulatory Visit (HOSPITAL_COMMUNITY): Payer: Self-pay

## 2021-10-22 MED ORDER — DILTIAZEM HCL ER BEADS 360 MG PO CP24
360.0000 mg | ORAL_CAPSULE | Freq: Every day | ORAL | 3 refills | Status: DC
  Filled 2021-10-22: qty 90, 90d supply, fill #0
  Filled 2022-01-15: qty 90, 90d supply, fill #1
  Filled 2022-04-16: qty 90, 90d supply, fill #2
  Filled 2022-08-22: qty 90, 90d supply, fill #3

## 2021-10-23 ENCOUNTER — Other Ambulatory Visit (HOSPITAL_COMMUNITY): Payer: Self-pay

## 2021-10-23 ENCOUNTER — Other Ambulatory Visit: Payer: Self-pay

## 2021-10-23 ENCOUNTER — Ambulatory Visit: Payer: PPO | Admitting: Pulmonary Disease

## 2021-10-23 ENCOUNTER — Encounter: Payer: Self-pay | Admitting: Pulmonary Disease

## 2021-10-23 VITALS — BP 160/94 | HR 75 | Temp 98.1°F | Ht 72.0 in | Wt 225.4 lb

## 2021-10-23 DIAGNOSIS — R599 Enlarged lymph nodes, unspecified: Secondary | ICD-10-CM

## 2021-10-23 DIAGNOSIS — D869 Sarcoidosis, unspecified: Secondary | ICD-10-CM | POA: Diagnosis not present

## 2021-10-23 DIAGNOSIS — Z87891 Personal history of nicotine dependence: Secondary | ICD-10-CM

## 2021-10-23 DIAGNOSIS — J45909 Unspecified asthma, uncomplicated: Secondary | ICD-10-CM | POA: Diagnosis not present

## 2021-10-23 MED ORDER — PREDNISONE 1 MG PO TABS
1.0000 mg | ORAL_TABLET | Freq: Every day | ORAL | 0 refills | Status: DC
  Filled 2021-10-23: qty 90, 90d supply, fill #0

## 2021-10-23 NOTE — Progress Notes (Signed)
Synopsis: Referred in September 2022 for abnormal CT by [name], MD  Subjective:   PATIENT ID: [name] GENDER: male DOB: [birth year], MRN: [id number]  Chief Complaint  Patient presents with   Follow-up    Follow up. Patient has no complaints.     This is a 66 year old gentleman, history of CVA, hypertension, hyperlipidemia, smoker.  Has had a recent cardiac catheterization which showed mild pulmonary hypertension on his right heart cath and coronary artery disease on his left.  This was completed yesterday.  He had a Hospitalization in August.  Has a history of sarcoidosis and COPD.  Due to shortness of breath he had a CT scan of the chest which revealed mediastinal adenopathy as well as a 3.7 x 2.4 cm anterior right lower lobe peripheral based lesion.  Patient was referred today for evaluation of abnormal CT imaging.  From respiratory standpoint he is doing well with no complaints today.  OV 09/25/2021: Here today for follow-up.Patient was last seen in the office by Patricia Nettle, NP PET scan in September showed resolution of abnormality seen in the right lower lobe.  Patient had a history of sarcoidosis documented based on imaging.  No previous lung biopsy.  He initially saw me after a hospitalization.  We thought it would be best to obtain the PET before consideration for biopsy.  Patient had a repeat CT scan of the chest and December 2022.  He has areas of tree-in-bud opacities within the lung seen on this CT scan.  He also has calcified nodes within the mediastinum.  Patient still taking 2.5 mg prednisone daily.  Has been on this for many years.  OV 10/23/2021: Here today for follow-up regarding sarcoidosis.  Overall doing well with no significant issues at this time.  Has been on prednisone for a long time.  Last time we talked about decreasing his prednisone dosing.  He also has other scattered areas of tree-in-bud nodularity.  Additionally has calcified nodal disease.   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   ED (erectile dysfunction)    GERD (gastroesophageal reflux disease)    Hypercalcemia    Hyperlipidemia    Hypertension    Insomnia    Long-term use of high-risk medication    Nephrocalcinosis    Nephrolithiasis    Obesity    Osteopenia    Pancreatitis    admitted 03/19/01-06/05/01   PE (pulmonary embolism) 01/30/2013   Proteinuria    Sarcoidosis    Sleep apnea    Smoker    Stroke [facility])      Family History  Problem Relation Age of Onset   Hypertension Father    CVA Father    Lung cancer Father    Alzheimer's disease Mother    Hypertension Sister    Heart failure Sister      Past Surgical History:  Procedure Laterality Date   ABDOMINAL EXPLORATION SURGERY     BACK SURGERY     CHOLECYSTECTOMY     RIGHT/LEFT HEART CATH AND CORONARY ANGIOGRAPHY N/A 05/12/2021   Procedure: RIGHT/LEFT HEART CATH AND CORONARY ANGIOGRAPHY;  Surgeon: [name], MD;  Location: [place] [facility];  Service: Cardiovascular;  Laterality: N/A;  SHOULDER ARTHROSCOPY Right 10/30/2020   Procedure: ARTHROSCOPY SHOULDER WITH EXTENSIVE DEBRIDEMENT;  Surgeon: Mcarthur Rossetti, MD;  Location: [place];  Service: Orthopedics;  Laterality: Right;   SHOULDER SURGERY     TRACHEOSTOMY     closed    Social History   Socioeconomic History   Marital status: Married    Spouse name: Building services engineer   Number of children: 2   Years of education: Not on file   Highest education level: Associate degree: academic program  Occupational History   Not on file  Tobacco Use   Smoking status: Former    Packs/day: 0.25    Years: 15.00    Pack years: 3.75    Types: Cigarettes    Start date: 09/06/2005    Quit date:  02/23/2021    Years since quitting: 0.6   Smokeless tobacco: Never  Vaping Use   Vaping Use: Never used  Substance and Sexual Activity   Alcohol use: No    Alcohol/week: 0.0 standard drinks   Drug use: No   Sexual activity: Yes  Other Topics Concern   Not on file  Social History Narrative   Not on file   Social Determinants of Health   Financial Resource Strain: Low Risk    Difficulty of Paying Living Expenses: Not very hard  Food Insecurity: Not on file  Transportation Needs: No Transportation Needs   Lack of Transportation (Medical): No   Lack of Transportation (Non-Medical): No  Physical Activity: Not on file  Stress: Not on file  Social Connections: Not on file  Intimate Partner Violence: Not on file     Allergies  Allergen Reactions   Codeine Nausea Only   Hydrochlorothiazide     Unknown   Hydromorphone Other (See Comments)    hallucinations      Outpatient Medications Prior to Visit  Medication Sig Dispense Refill   aspirin 81 MG tablet Take 1 tablet (81 mg total) by mouth daily.     atorvastatin (LIPITOR) 20 MG tablet Take 1 tablet by mouth daily.     bumetanide (BUMEX) 1 MG tablet Take 1 tablet (1 mg total) by mouth daily. 90 tablet 0   dapagliflozin propanediol (FARXIGA) 10 MG TABS tablet Take 1 tablet (10 mg total) by mouth daily. 90 tablet 3   diltiazem (TIAZAC) 360 MG 24 hr capsule Take 360 mg by mouth daily.     diltiazem (TIAZAC) 360 MG 24 hr capsule TAKE ONE CAPSULE BY MOUTH EVERY DAY 90 capsule 3   fluticasone (FLONASE) 50 MCG/ACT nasal spray Place 2 sprays into both nostrils daily as needed for allergies or rhinitis.     hydrALAZINE (APRESOLINE) 100 MG tablet Take 1 tablet (100 mg total) by mouth 3 (three) times daily. 90 tablet 3   isosorbide mononitrate (IMDUR) 30 MG 24 hr tablet Take 3 tablets (90 mg total) by mouth daily. 90 tablet 6   metoprolol (TOPROL-XL) 200 MG 24 hr tablet Take 1 tablet by mouth once daily 90 tablet 3   potassium chloride SA  (KLOR-CON M) 20 MEQ tablet Take 1 tablet  by mouth daily. 90 tablet 0   predniSONE (DELTASONE) 10 MG tablet 40 mg oral daily, decrease by 10 mg every 5 days. (Patient taking differently: Take 5 mg by mouth daily with breakfast.) 45 tablet 0   predniSONE (DELTASONE) 5 MG tablet Take 1 tablet by mouth once daily. 90 tablet 3   sacubitril-valsartan (ENTRESTO) 97-103 MG Take 1 tablet by mouth 2 (  two) times daily. 60 tablet 3   tamsulosin (FLOMAX) 0.4 MG CAPS capsule Take 0.4 mg by mouth daily.     tamsulosin (FLOMAX) 0.4 MG CAPS capsule Take 1 capsule by mouth daily - need appointment for more refills 30 capsule 6   traZODone (DESYREL) 150 MG tablet Take 1 tablet (150 mg total) by mouth at bedtime as needed for sleep. 90 tablet 3   traZODone (DESYREL) 150 MG tablet TAKE 1 TABLET AT BEDTIME 90 tablet 3   traZODone (DESYREL) 150 MG tablet Take 1 tablet (150 mg total) by mouth at bedtime. 90 tablet 0   triamcinolone cream (KENALOG) 0.1 % Apply to affected areas twice a day as needed for itching/inflammation. 80 g 2   No facility-administered medications prior to visit.    Review of Systems  Constitutional:  Negative for chills, fever, malaise/fatigue and weight loss.  HENT:  Negative for hearing loss, sore throat and tinnitus.   Eyes:  Negative for blurred vision and double vision.  Respiratory:  Negative for cough, hemoptysis, sputum production, shortness of breath, wheezing and stridor.   Cardiovascular:  Negative for chest pain, palpitations, orthopnea, leg swelling and PND.  Gastrointestinal:  Negative for abdominal pain, constipation, diarrhea, heartburn, nausea and vomiting.  Genitourinary:  Negative for dysuria, hematuria and urgency.  Musculoskeletal:  Negative for joint pain and myalgias.  Skin:  Negative for itching and rash.  Neurological:  Negative for dizziness, tingling, weakness and headaches.  Endo/Heme/Allergies:  Negative for environmental allergies. Does not bruise/bleed easily.   Psychiatric/Behavioral:  Negative for depression. The patient is not nervous/anxious and does not have insomnia.   All other systems reviewed and are negative.   Objective:  Physical Exam Vitals reviewed.  Constitutional:      General: He is not in acute distress.    Appearance: He is well-developed.  HENT:     Head: Normocephalic and atraumatic.  Eyes:     General: No scleral icterus.    Conjunctiva/sclera: Conjunctivae normal.     Pupils: Pupils are equal, round, and reactive to light.  Neck:     Vascular: No JVD.     Trachea: No tracheal deviation.  Cardiovascular:     Rate and Rhythm: Normal rate and regular rhythm.     Heart sounds: Normal heart sounds. No murmur heard. Pulmonary:     Effort: Pulmonary effort is normal. No tachypnea, accessory muscle usage or respiratory distress.     Breath sounds: No stridor. No wheezing, rhonchi or rales.  Abdominal:     General: There is no distension.     Palpations: Abdomen is soft.     Tenderness: There is no abdominal tenderness.  Musculoskeletal:        General: No tenderness.     Cervical back: Neck supple.  Lymphadenopathy:     Cervical: No cervical adenopathy.  Skin:    General: Skin is warm and dry.     Capillary Refill: Capillary refill takes less than 2 seconds.     Findings: No rash.  Neurological:     Mental Status: He is alert and oriented to person, place, and time.  Psychiatric:        Behavior: Behavior normal.     Vitals:   10/23/21 0902  BP: (!) 160/94  Pulse: 75  Temp: 98.1 F (36.7 C)  TempSrc: Oral  SpO2: 94%  Weight: 225 lb 6.4 oz (102.2 kg)  Height: 6' (1.829 m)   94% on RA BMI Readings from Last 3  Encounters:  10/23/21 30.57 kg/m  09/25/21 32.20 kg/m  09/21/21 29.70 kg/m   Wt Readings from Last 3 Encounters:  10/23/21 225 lb 6.4 oz (102.2 kg)  09/25/21 224 lb 6.4 oz (101.8 kg)  09/21/21 219 lb (99.3 kg)     CBC    Component Value Date/Time   WBC 10.0 04/22/2021 0149   RBC  3.40 (L) 04/22/2021 0149   HGB 11.6 (L) 05/12/2021 0811   HCT 34.0 (L) 05/12/2021 0811   PLT 221 04/22/2021 0149   MCV 90.6 04/22/2021 0149   MCV 89.4 11/02/2015 1003   MCH 30.0 04/22/2021 0149   MCHC 33.1 04/22/2021 0149   RDW 15.7 (H) 04/22/2021 0149   LYMPHSABS 0.5 (L) 04/17/2021 1930   MONOABS 0.5 04/17/2021 1930   EOSABS 0.4 04/17/2021 1930   BASOSABS 0.0 04/17/2021 1930    Chest Imaging: 04/21/2021: CT scan of the chest. 3.7 x 2.4 cm right lower lobe peripheral based lesion, areas of patchy nodularity and peribronchovascular thickening with his history of sarcoid. The patient's images have been independently reviewed by me.    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  Calcified lymph nodes within the mediastinum. The patient's images have been independently reviewed by me.    Pulmonary Functions Testing Results: No flowsheet data found.  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 If he has angina symptoms in future, could consider PCI to distal RCA.    Assessment & Plan:     ICD-10-CM   1. Sarcoidosis  D86.9     2. Moderate asthma, unspecified whether complicated, unspecified whether persistent  J45.909     3. Former smoker  Z87.891        Discussion:  This is a 66 year old gentleman initially seen with a peripheral 3 cm lower lobe based mass follow-up imaging showed resolution of this presumed  inflammatory has been on a long time low-dose prednisone for several years.  No previous diagnosis with biopsy confirmation of sarcoid however felt to be related to sarcoidosis.  He has calcified mediastinal nodes and scattered areas of tree-in-bud which may represent chronic MAI.  Plan: I do not see a reason for him to stay on chronic prednisone. I think with calcified nodal disease he likely has burned-out sarcoidosis. I would like to taper him off of prednisone to see if he has any significant respiratory changes.  If he has new radiographic findings concerning for sarcoidosis it would be nice to confirm a tissue diagnosis of sarcoid before we committed him to prolonged immune suppression.  Additionally if he is having progressive disease despite low-dose prednisone he may be better served on a different form of immune suppression such as methotrexate or Arava. I will take his oral prednisone dose to 1 mg a day. Have him follow  back and see [place] in a couple of months with repeat CT imaging of the chest.    Current Outpatient Medications:    aspirin 81 MG tablet, Take 1 tablet (81 mg total) by mouth daily., Disp: , Rfl:    atorvastatin (LIPITOR) 20 MG tablet, Take 1 tablet by mouth daily., Disp: , Rfl:    bumetanide (BUMEX) 1 MG tablet, Take 1 tablet (1 mg total) by mouth daily., Disp: 90 tablet, Rfl: 0   dapagliflozin propanediol (FARXIGA) 10 MG TABS tablet, Take 1 tablet (10 mg total) by mouth daily., Disp: 90 tablet, Rfl: 3   diltiazem (TIAZAC) 360 MG 24 hr capsule, Take 360 mg by mouth daily., Disp: , Rfl:    diltiazem (TIAZAC) 360 MG 24 hr capsule, TAKE ONE CAPSULE BY MOUTH EVERY DAY, Disp: 90 capsule, Rfl: 3   fluticasone (FLONASE) 50 MCG/ACT nasal spray, Place 2 sprays into both nostrils daily as needed for allergies or rhinitis., Disp: , Rfl:    hydrALAZINE (APRESOLINE) 100 MG tablet, Take 1 tablet (100 mg total) by mouth 3 (three) times daily., Disp: 90 tablet, Rfl: 3   isosorbide  mononitrate (IMDUR) 30 MG 24 hr tablet, Take 3 tablets (90 mg total) by mouth daily., Disp: 90 tablet, Rfl: 6   metoprolol (TOPROL-XL) 200 MG 24 hr tablet, Take 1 tablet by mouth once daily, Disp: 90 tablet, Rfl: 3   potassium chloride SA (KLOR-CON M) 20 MEQ tablet, Take 1 tablet  by mouth daily., Disp: 90 tablet, Rfl: 0   predniSONE (DELTASONE) 1 MG tablet, Take 1 tablet by mouth daily with breakfast., Disp: 90 tablet, Rfl: 0   predniSONE (DELTASONE) 10 MG tablet, 40 mg oral daily, decrease by 10 mg every 5 days. (Patient taking differently: Take 5 mg by mouth daily with breakfast.), Disp: 45 tablet, Rfl: 0   predniSONE (DELTASONE) 5 MG tablet, Take 1 tablet by mouth once daily., Disp: 90 tablet, Rfl: 3   sacubitril-valsartan (ENTRESTO) 97-103 MG, Take 1 tablet by mouth 2 (two) times daily., Disp: 60 tablet, Rfl: 3   tamsulosin (FLOMAX) 0.4 MG CAPS capsule, Take 0.4 mg by mouth daily., Disp: , Rfl:    tamsulosin (FLOMAX) 0.4 MG CAPS capsule, Take 1 capsule by mouth daily - need appointment for more refills, Disp: 30 capsule, Rfl: 6   traZODone (DESYREL) 150 MG tablet, Take 1 tablet (150 mg total) by mouth at bedtime as needed for sleep., Disp: 90 tablet, Rfl: 3   traZODone (DESYREL) 150 MG tablet, TAKE 1 TABLET AT BEDTIME, Disp: 90 tablet, Rfl: 3   traZODone (DESYREL) 150 MG tablet, Take 1 tablet (150 mg total) by mouth at bedtime., Disp: 90 tablet, Rfl: 0   triamcinolone cream (KENALOG) 0.1 %, Apply to affected areas twice a day as needed for itching/inflammation., Disp: 80 g, Rfl: 2    Garner Nash, DO [facility] 10/23/2021 10:03 AM

## 2021-10-23 NOTE — Patient Instructions (Signed)
Thank you for visiting Dr. Valeta Harms at [facility]. Today we recommend the following:  Meds ordered this encounter  Medications   predniSONE (DELTASONE) 1 MG tablet    Sig: Take 1 tablet (1 mg total) by mouth daily with breakfast.    Dispense:  90 tablet    Refill:  0   Return in about 3 months (around 01/20/2022). W/ Dr. Valeta Harms or APP     Please do your part to reduce the spread of COVID-19.

## 2021-10-24 DIAGNOSIS — G4733 Obstructive sleep apnea (adult) (pediatric): Secondary | ICD-10-CM | POA: Diagnosis not present

## 2021-10-25 ENCOUNTER — Other Ambulatory Visit: Payer: Self-pay | Admitting: Cardiology

## 2021-10-26 ENCOUNTER — Other Ambulatory Visit (HOSPITAL_COMMUNITY): Payer: Self-pay

## 2021-10-26 DIAGNOSIS — H04553 Acquired stenosis of bilateral nasolacrimal duct: Secondary | ICD-10-CM | POA: Diagnosis not present

## 2021-10-26 DIAGNOSIS — H04123 Dry eye syndrome of bilateral lacrimal glands: Secondary | ICD-10-CM | POA: Diagnosis not present

## 2021-10-26 DIAGNOSIS — H25813 Combined forms of age-related cataract, bilateral: Secondary | ICD-10-CM | POA: Diagnosis not present

## 2021-10-26 DIAGNOSIS — H401131 Primary open-angle glaucoma, bilateral, mild stage: Secondary | ICD-10-CM | POA: Diagnosis not present

## 2021-10-26 DIAGNOSIS — H524 Presbyopia: Secondary | ICD-10-CM | POA: Diagnosis not present

## 2021-10-26 MED ORDER — ENTRESTO 97-103 MG PO TABS
1.0000 | ORAL_TABLET | Freq: Two times a day (BID) | ORAL | 3 refills | Status: DC
  Filled 2021-10-26: qty 60, 30d supply, fill #0
  Filled 2021-12-13: qty 60, 30d supply, fill #1
  Filled 2022-01-15: qty 60, 30d supply, fill #2
  Filled 2022-02-12: qty 60, 30d supply, fill #3

## 2021-10-28 DIAGNOSIS — I10 Essential (primary) hypertension: Secondary | ICD-10-CM | POA: Diagnosis not present

## 2021-10-29 DIAGNOSIS — G4733 Obstructive sleep apnea (adult) (pediatric): Secondary | ICD-10-CM | POA: Diagnosis not present

## 2021-11-07 ENCOUNTER — Other Ambulatory Visit (HOSPITAL_COMMUNITY): Payer: Self-pay

## 2021-11-13 ENCOUNTER — Other Ambulatory Visit (HOSPITAL_COMMUNITY): Payer: Self-pay

## 2021-11-13 MED ORDER — CEFDINIR 300 MG PO CAPS
ORAL_CAPSULE | ORAL | 0 refills | Status: DC
Start: 2021-11-13 — End: 2022-01-23
  Filled 2021-11-13: qty 20, 10d supply, fill #0

## 2021-11-17 ENCOUNTER — Other Ambulatory Visit (HOSPITAL_COMMUNITY): Payer: Self-pay

## 2021-11-17 MED ORDER — GABAPENTIN 300 MG PO CAPS
ORAL_CAPSULE | ORAL | 0 refills | Status: DC
  Filled 2021-11-17: qty 90, 90d supply, fill #0

## 2021-11-23 ENCOUNTER — Ambulatory Visit: Payer: PPO | Admitting: Adult Health

## 2021-11-23 ENCOUNTER — Encounter: Payer: Self-pay | Admitting: Adult Health

## 2021-11-23 ENCOUNTER — Other Ambulatory Visit: Payer: Self-pay

## 2021-11-23 VITALS — BP 143/88 | HR 60 | Ht 71.0 in | Wt 222.0 lb

## 2021-11-23 DIAGNOSIS — G4733 Obstructive sleep apnea (adult) (pediatric): Secondary | ICD-10-CM | POA: Diagnosis not present

## 2021-11-23 DIAGNOSIS — Z789 Other specified health status: Secondary | ICD-10-CM | POA: Diagnosis not present

## 2021-11-23 NOTE — Progress Notes (Signed)
?[facility] ?[address] ?[place]. [place] [address] ?[phone] ? ?     OFFICE FOLLOW UP NOTE ? ?[name] ?Date of Birth:  Aug 26, 1956 ?Medical Record Number:  [id number]  ? ? ?Primary neurologist: Dr Brett Fairy  ?Reason for visit: sleep apnea follow up ? ? ? ?SUBJECTIVE: ? ? ?CHIEF COMPLAINT:  ?Chief Complaint  ?Patient presents with  ? Obstructive Sleep Apnea  ?  Rm 2 alone ?Pt is well, states mask wont stay on his face.  ? ? ?HPI:  ? ?Update 11/23/2021 JM: 66 year old male patient of Dr. Brett Fairy for sleep apnea. He was seen by Dr. Brett Fairy 3 months ago for initial CPAP follow-up. He returns today for CPAP follow up visit.  Review of compliance report as below. Reports continued difficulty tolerating CPAP nose mask, he has not used since December.  At times will drool or feels like mask is shifting. He has a difficult time sleeping with mask on.  He has not contacted DME company about different mask types.  He has no concerns regarding pressure settings.  Epworth Sleepiness Scale 1/24.  Fatigue severity scale 17/63. ? ? ? ? ? ? ? ? ? ?Update 08-24-2021 JM: Rv  encephalopathy. EEG normal, CPAP has not worked well for him, he didn't bring the machine with him. He reports condensation water in tubing and mask, he also uses a FFM.  ?[name] was diagnosed in a split-night polysomnography from 04-29-2021.  He was originally seen in referral from a hospitalist at [facility] after a stay.  Had a home sleep test in April 2021 had a 15.4 AHI at that time but strong REM dependency.  REM AHI was 37/h and he was tachycardic bradycardic.  34 minutes of hypoxemia were noted and his repeat study in lab he did have again a REM dependent apnea form and prolonged hypoxia and he has truly woken up very frequently before CPAP was initiated he slept better and more sustained when the CPAP was in place.  No added oxygen was needed CPAP at 10 cmH2O helped hypoxia" his apnea he was sleeping best in a  reclined position with the chest and head of bed elevated. ?He is not a loud snorer, and reports improved nasal patency. Marland Kitchen  ?MMSE today 28/ 30 points.  ?  ? ? ? ? ? ?ROS:   ?14 system review of systems performed and negative with exception of those listed in HPI ? ?PMH:  ?Past Medical History:  ?Diagnosis Date  ? Back pain   ? CHF (congestive heart failure) ([place])   ? CKD (chronic kidney disease), stage III ([place])   ? CVA (cerebral infarction)   ? Diastolic dysfunction   ? Dilation of thoracic aorta (HCC)   ? 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  ? ED (erectile dysfunction)   ? GERD (gastroesophageal reflux disease)   ? Hypercalcemia   ? Hyperlipidemia   ? Hypertension   ? Insomnia   ? Long-term use of high-risk medication   ? Nephrocalcinosis   ? Nephrolithiasis   ? Obesity   ? Osteopenia   ? Pancreatitis   ? admitted 03/19/01-06/05/01  ? PE (pulmonary embolism) 01/30/2013  ? Proteinuria   ? Sarcoidosis   ? Sleep apnea   ? Smoker   ? Stroke [name])   ? ? ?PSH:  ?Past Surgical History:  ?Procedure Laterality Date  ? ABDOMINAL EXPLORATION SURGERY    ? BACK SURGERY    ? CHOLECYSTECTOMY    ?  RIGHT/LEFT HEART CATH AND CORONARY ANGIOGRAPHY N/A 05/12/2021  ? Procedure: RIGHT/LEFT HEART CATH AND CORONARY ANGIOGRAPHY;  Surgeon: [name], MD;  Location: [place] [facility];  Service: Cardiovascular;  Laterality: N/A;  ? SHOULDER ARTHROSCOPY Right 10/30/2020  ? Procedure: ARTHROSCOPY SHOULDER WITH EXTENSIVE DEBRIDEMENT;  Surgeon: Mcarthur Rossetti, MD;  Location: [place];  Service: Orthopedics;  Laterality: Right;  ? SHOULDER SURGERY    ? TRACHEOSTOMY    ? closed  ? ? ?Social History:  ?Social History  ? ?Socioeconomic History  ? Marital status: Married  ?  Spouse name: Rondall Radigan  ? Number of children: 2  ? Years of education: Not on file  ? Highest education level: Associate degree: academic program  ?Occupational History  ? Not on file  ?Tobacco Use  ? Smoking status: Former  ?  Packs/day:  0.25  ?  Years: 15.00  ?  Pack years: 3.75  ?  Types: Cigarettes  ?  Start date: 09/06/2005  ?  Quit date: 02/23/2021  ?  Years since quitting: 0.7  ? Smokeless tobacco: Never  ?Vaping Use  ? Vaping Use: Never used  ?Substance and Sexual Activity  ? Alcohol use: No  ?  Alcohol/week: 0.0 standard drinks  ? Drug use: No  ? Sexual activity: Yes  ?Other Topics Concern  ? Not on file  ?Social History Narrative  ? Not on file  ? ?Social Determinants of Health  ? ?Financial Resource Strain: Low Risk   ? Difficulty of Paying Living Expenses: Not very hard  ?Food Insecurity: Not on file  ?Transportation Needs: No Transportation Needs  ? Lack of Transportation (Medical): No  ? Lack of Transportation (Non-Medical): No  ?Physical Activity: Not on file  ?Stress: Not on file  ?Social Connections: Not on file  ?Intimate Partner Violence: Not on file  ? ? ?Family History:  ?Family History  ?Problem Relation Age of Onset  ? Hypertension Father   ? CVA Father   ? Lung cancer Father   ? Alzheimer's disease Mother   ? Hypertension Sister   ? Heart failure Sister   ? ? ?Medications:   ?Current Outpatient Medications on File Prior to Visit  ?Medication Sig Dispense Refill  ? aspirin 81 MG tablet Take 1 tablet (81 mg total) by mouth daily.    ? atorvastatin (LIPITOR) 20 MG tablet Take 1 tablet by mouth daily.    ? bumetanide (BUMEX) 1 MG tablet Take 1 tablet (1 mg total) by mouth daily. 90 tablet 0  ? cefdinir (OMNICEF) 300 MG capsule Take 1 capsule by mouth 2 times  a day for 10 days as directed 20 capsule 0  ? dapagliflozin propanediol (FARXIGA) 10 MG TABS tablet Take 1 tablet (10 mg total) by mouth daily. 90 tablet 3  ? diltiazem (TIAZAC) 360 MG 24 hr capsule TAKE ONE CAPSULE BY MOUTH EVERY DAY 90 capsule 3  ? fluticasone (FLONASE) 50 MCG/ACT nasal spray Place 2 sprays into both nostrils daily as needed for allergies or rhinitis.    ? gabapentin (NEURONTIN) 300 MG capsule Take 1 capsule by mouth at bedtime for pain 90 capsule 0  ?  hydrALAZINE (APRESOLINE) 100 MG tablet Take 1 tablet (100 mg total) by mouth 3 (three) times daily. 90 tablet 3  ? isosorbide mononitrate (IMDUR) 30 MG 24 hr tablet Take 3 tablets by mouth daily. 90 tablet 6  ? metoprolol (TOPROL-XL) 200 MG 24 hr tablet Take 1 tablet by mouth once daily  90 tablet 3  ? potassium chloride SA (KLOR-CON M) 20 MEQ tablet Take 1 tablet  by mouth daily. 90 tablet 0  ? predniSONE (DELTASONE) 1 MG tablet Take 1 tablet by mouth daily with breakfast. 90 tablet 0  ? sacubitril-valsartan (ENTRESTO) 97-103 MG Take 1 tablet by mouth 2 (two) times daily. 60 tablet 3  ? tamsulosin (FLOMAX) 0.4 MG CAPS capsule Take 1 capsule by mouth daily - need appointment for more refills 30 capsule 6  ? traZODone (DESYREL) 150 MG tablet Take 1 tablet (150 mg total) by mouth at bedtime as needed for sleep. 90 tablet 3  ? triamcinolone cream (KENALOG) 0.1 % Apply to affected areas twice a day as needed for itching/inflammation. 80 g 2  ? ?No current facility-administered medications on file prior to visit.  ? ? ?Allergies:   ?Allergies  ?Allergen Reactions  ? Codeine Nausea Only  ? Hydrochlorothiazide   ?  Unknown  ? Hydromorphone Other (See Comments)  ?  hallucinations   ? ? ? ? ?OBJECTIVE: ? ?Physical Exam ? ?Vitals:  ? 11/23/21 0808  ?BP: (!) 143/88  ?Pulse: 60  ?Weight: 222 lb (100.7 kg)  ?Height: '5\' 11"'$  (1.803 m)  ? ?Body mass index is 30.96 kg/m?Marland Kitchen ?No results found. ? ?General: well developed, well nourished, pleasant middle-aged male, seated, in no evident distress ?Head: head normocephalic and atraumatic.   ?Neck: supple with no carotid or supraclavicular bruits ?Cardiovascular: regular rate and rhythm, no murmurs ?Musculoskeletal: no deformity ?Skin:  no rash/petichiae ?Vascular:  Normal pulses all extremities ?  ?Neurologic Exam ?Mental Status: Awake and fully alert. Oriented to place and time. Recent and remote memory intact. Attention span, concentration and fund of knowledge appropriate. Mood and  affect appropriate.  ?Cranial Nerves:  Pupils equal, briskly reactive to light. Extraocular movements full without nystagmus. Visual fields full to confrontation. Hearing intact. Facial sensation intact. Face, tongue,

## 2021-11-24 ENCOUNTER — Other Ambulatory Visit: Payer: Self-pay | Admitting: Cardiology

## 2021-11-25 ENCOUNTER — Other Ambulatory Visit (HOSPITAL_COMMUNITY): Payer: Self-pay

## 2021-11-25 MED ORDER — POTASSIUM CHLORIDE CRYS ER 20 MEQ PO TBCR
20.0000 meq | EXTENDED_RELEASE_TABLET | Freq: Every day | ORAL | 0 refills | Status: DC
  Filled 2021-11-25: qty 90, 90d supply, fill #0

## 2021-11-26 ENCOUNTER — Other Ambulatory Visit (HOSPITAL_COMMUNITY): Payer: Self-pay

## 2021-11-28 ENCOUNTER — Other Ambulatory Visit (HOSPITAL_COMMUNITY): Payer: Self-pay

## 2021-12-07 ENCOUNTER — Other Ambulatory Visit (HOSPITAL_COMMUNITY): Payer: Self-pay

## 2021-12-14 ENCOUNTER — Other Ambulatory Visit (HOSPITAL_COMMUNITY): Payer: Self-pay

## 2021-12-21 ENCOUNTER — Other Ambulatory Visit: Payer: Self-pay | Admitting: Cardiology

## 2021-12-22 ENCOUNTER — Other Ambulatory Visit (HOSPITAL_COMMUNITY): Payer: Self-pay

## 2021-12-22 DIAGNOSIS — H401131 Primary open-angle glaucoma, bilateral, mild stage: Secondary | ICD-10-CM | POA: Diagnosis not present

## 2021-12-22 MED ORDER — BUMETANIDE 1 MG PO TABS
1.0000 mg | ORAL_TABLET | Freq: Every day | ORAL | 0 refills | Status: DC
  Filled 2021-12-22: qty 90, 90d supply, fill #0

## 2021-12-23 ENCOUNTER — Other Ambulatory Visit (HOSPITAL_COMMUNITY): Payer: Self-pay

## 2021-12-28 ENCOUNTER — Other Ambulatory Visit (HOSPITAL_COMMUNITY): Payer: Self-pay

## 2021-12-29 ENCOUNTER — Other Ambulatory Visit (HOSPITAL_COMMUNITY): Payer: Self-pay

## 2022-01-15 ENCOUNTER — Ambulatory Visit
Admission: RE | Admit: 2022-01-15 | Discharge: 2022-01-15 | Disposition: A | Discharge status: Home / Self Care | Payer: PPO | Source: Ambulatory Visit | Attending: Internal Medicine | Admitting: Internal Medicine

## 2022-01-15 ENCOUNTER — Other Ambulatory Visit (HOSPITAL_COMMUNITY): Payer: Self-pay

## 2022-01-15 DIAGNOSIS — J929 Pleural plaque without asbestos: Secondary | ICD-10-CM | POA: Diagnosis not present

## 2022-01-15 DIAGNOSIS — D869 Sarcoidosis, unspecified: Secondary | ICD-10-CM

## 2022-01-15 DIAGNOSIS — R599 Enlarged lymph nodes, unspecified: Secondary | ICD-10-CM

## 2022-01-15 DIAGNOSIS — I898 Other specified noninfective disorders of lymphatic vessels and lymph nodes: Secondary | ICD-10-CM | POA: Diagnosis not present

## 2022-01-15 DIAGNOSIS — I2510 Atherosclerotic heart disease of native coronary artery without angina pectoris: Secondary | ICD-10-CM | POA: Diagnosis not present

## 2022-01-15 DIAGNOSIS — D8689 Sarcoidosis of other sites: Secondary | ICD-10-CM | POA: Diagnosis not present

## 2022-01-19 DIAGNOSIS — N2581 Secondary hyperparathyroidism of renal origin: Secondary | ICD-10-CM | POA: Diagnosis not present

## 2022-01-20 ENCOUNTER — Inpatient Hospital Stay (HOSPITAL_COMMUNITY)
Admission: EM | Admit: 2022-01-20 | Discharge: 2022-01-23 | DRG: 291 | Disposition: A | Discharge status: Home / Self Care | Payer: PPO | Attending: Internal Medicine | Admitting: Internal Medicine

## 2022-01-20 ENCOUNTER — Encounter (HOSPITAL_COMMUNITY): Payer: Self-pay

## 2022-01-20 ENCOUNTER — Emergency Department (HOSPITAL_COMMUNITY): Payer: PPO

## 2022-01-20 DIAGNOSIS — J9601 Acute respiratory failure with hypoxia: Secondary | ICD-10-CM | POA: Diagnosis present

## 2022-01-20 DIAGNOSIS — D860 Sarcoidosis of lung: Secondary | ICD-10-CM | POA: Diagnosis present

## 2022-01-20 DIAGNOSIS — I272 Pulmonary hypertension, unspecified: Secondary | ICD-10-CM | POA: Diagnosis present

## 2022-01-20 DIAGNOSIS — Z8673 Personal history of transient ischemic attack (TIA), and cerebral infarction without residual deficits: Secondary | ICD-10-CM

## 2022-01-20 DIAGNOSIS — G4733 Obstructive sleep apnea (adult) (pediatric): Secondary | ICD-10-CM | POA: Diagnosis present

## 2022-01-20 DIAGNOSIS — J449 Chronic obstructive pulmonary disease, unspecified: Secondary | ICD-10-CM | POA: Diagnosis present

## 2022-01-20 DIAGNOSIS — I5033 Acute on chronic diastolic (congestive) heart failure: Secondary | ICD-10-CM | POA: Diagnosis present

## 2022-01-20 DIAGNOSIS — Z7982 Long term (current) use of aspirin: Secondary | ICD-10-CM | POA: Diagnosis not present

## 2022-01-20 DIAGNOSIS — I161 Hypertensive emergency: Secondary | ICD-10-CM | POA: Diagnosis present

## 2022-01-20 DIAGNOSIS — E876 Hypokalemia: Secondary | ICD-10-CM | POA: Diagnosis present

## 2022-01-20 DIAGNOSIS — Z20822 Contact with and (suspected) exposure to covid-19: Secondary | ICD-10-CM | POA: Diagnosis present

## 2022-01-20 DIAGNOSIS — Z87891 Personal history of nicotine dependence: Secondary | ICD-10-CM | POA: Diagnosis not present

## 2022-01-20 DIAGNOSIS — Z888 Allergy status to other drugs, medicaments and biological substances status: Secondary | ICD-10-CM

## 2022-01-20 DIAGNOSIS — R0689 Other abnormalities of breathing: Secondary | ICD-10-CM | POA: Diagnosis not present

## 2022-01-20 DIAGNOSIS — Z8249 Family history of ischemic heart disease and other diseases of the circulatory system: Secondary | ICD-10-CM | POA: Diagnosis not present

## 2022-01-20 DIAGNOSIS — Z885 Allergy status to narcotic agent status: Secondary | ICD-10-CM | POA: Diagnosis not present

## 2022-01-20 DIAGNOSIS — I10 Essential (primary) hypertension: Secondary | ICD-10-CM | POA: Diagnosis not present

## 2022-01-20 DIAGNOSIS — Z79899 Other long term (current) drug therapy: Secondary | ICD-10-CM | POA: Diagnosis not present

## 2022-01-20 DIAGNOSIS — K219 Gastro-esophageal reflux disease without esophagitis: Secondary | ICD-10-CM | POA: Diagnosis present

## 2022-01-20 DIAGNOSIS — I2510 Atherosclerotic heart disease of native coronary artery without angina pectoris: Secondary | ICD-10-CM | POA: Diagnosis present

## 2022-01-20 DIAGNOSIS — I110 Hypertensive heart disease with heart failure: Secondary | ICD-10-CM | POA: Diagnosis not present

## 2022-01-20 DIAGNOSIS — I5032 Chronic diastolic (congestive) heart failure: Secondary | ICD-10-CM | POA: Diagnosis present

## 2022-01-20 DIAGNOSIS — I130 Hypertensive heart and chronic kidney disease with heart failure and stage 1 through stage 4 chronic kidney disease, or unspecified chronic kidney disease: Principal | ICD-10-CM | POA: Diagnosis present

## 2022-01-20 DIAGNOSIS — I491 Atrial premature depolarization: Secondary | ICD-10-CM | POA: Diagnosis not present

## 2022-01-20 DIAGNOSIS — Z823 Family history of stroke: Secondary | ICD-10-CM

## 2022-01-20 DIAGNOSIS — R0902 Hypoxemia: Secondary | ICD-10-CM | POA: Diagnosis not present

## 2022-01-20 DIAGNOSIS — Z86711 Personal history of pulmonary embolism: Secondary | ICD-10-CM

## 2022-01-20 DIAGNOSIS — E785 Hyperlipidemia, unspecified: Secondary | ICD-10-CM | POA: Diagnosis present

## 2022-01-20 DIAGNOSIS — N1832 Chronic kidney disease, stage 3b: Secondary | ICD-10-CM | POA: Diagnosis present

## 2022-01-20 DIAGNOSIS — R609 Edema, unspecified: Secondary | ICD-10-CM | POA: Diagnosis not present

## 2022-01-20 DIAGNOSIS — I509 Heart failure, unspecified: Secondary | ICD-10-CM | POA: Diagnosis not present

## 2022-01-20 DIAGNOSIS — R0602 Shortness of breath: Secondary | ICD-10-CM | POA: Diagnosis not present

## 2022-01-20 NOTE — ED Triage Notes (Signed)
BIB [facility]. Pt c/o SOB started earlier today while cleaning, 82%RA with medic. Presents to ED on CPAP. Hx of COPD and CHF but doesn't wear o2 at home.   ?

## 2022-01-21 ENCOUNTER — Encounter (HOSPITAL_COMMUNITY): Payer: Self-pay | Admitting: Internal Medicine

## 2022-01-21 ENCOUNTER — Other Ambulatory Visit: Payer: Self-pay

## 2022-01-21 DIAGNOSIS — Z885 Allergy status to narcotic agent status: Secondary | ICD-10-CM | POA: Diagnosis not present

## 2022-01-21 DIAGNOSIS — Z86711 Personal history of pulmonary embolism: Secondary | ICD-10-CM | POA: Diagnosis not present

## 2022-01-21 DIAGNOSIS — I161 Hypertensive emergency: Secondary | ICD-10-CM | POA: Diagnosis present

## 2022-01-21 DIAGNOSIS — N1832 Chronic kidney disease, stage 3b: Secondary | ICD-10-CM

## 2022-01-21 DIAGNOSIS — E785 Hyperlipidemia, unspecified: Secondary | ICD-10-CM | POA: Diagnosis present

## 2022-01-21 DIAGNOSIS — I130 Hypertensive heart and chronic kidney disease with heart failure and stage 1 through stage 4 chronic kidney disease, or unspecified chronic kidney disease: Secondary | ICD-10-CM | POA: Diagnosis present

## 2022-01-21 DIAGNOSIS — Z79899 Other long term (current) drug therapy: Secondary | ICD-10-CM | POA: Diagnosis not present

## 2022-01-21 DIAGNOSIS — G4733 Obstructive sleep apnea (adult) (pediatric): Secondary | ICD-10-CM | POA: Diagnosis present

## 2022-01-21 DIAGNOSIS — I5033 Acute on chronic diastolic (congestive) heart failure: Secondary | ICD-10-CM | POA: Diagnosis present

## 2022-01-21 DIAGNOSIS — Z888 Allergy status to other drugs, medicaments and biological substances status: Secondary | ICD-10-CM | POA: Diagnosis not present

## 2022-01-21 DIAGNOSIS — E876 Hypokalemia: Secondary | ICD-10-CM | POA: Diagnosis present

## 2022-01-21 DIAGNOSIS — Z20822 Contact with and (suspected) exposure to covid-19: Secondary | ICD-10-CM | POA: Diagnosis present

## 2022-01-21 DIAGNOSIS — J9601 Acute respiratory failure with hypoxia: Secondary | ICD-10-CM | POA: Diagnosis present

## 2022-01-21 DIAGNOSIS — Z7982 Long term (current) use of aspirin: Secondary | ICD-10-CM | POA: Diagnosis not present

## 2022-01-21 DIAGNOSIS — I2510 Atherosclerotic heart disease of native coronary artery without angina pectoris: Secondary | ICD-10-CM | POA: Diagnosis present

## 2022-01-21 DIAGNOSIS — J449 Chronic obstructive pulmonary disease, unspecified: Secondary | ICD-10-CM | POA: Diagnosis present

## 2022-01-21 DIAGNOSIS — Z87891 Personal history of nicotine dependence: Secondary | ICD-10-CM | POA: Diagnosis not present

## 2022-01-21 DIAGNOSIS — K219 Gastro-esophageal reflux disease without esophagitis: Secondary | ICD-10-CM

## 2022-01-21 DIAGNOSIS — Z823 Family history of stroke: Secondary | ICD-10-CM | POA: Diagnosis not present

## 2022-01-21 DIAGNOSIS — R0602 Shortness of breath: Secondary | ICD-10-CM | POA: Diagnosis not present

## 2022-01-21 DIAGNOSIS — I272 Pulmonary hypertension, unspecified: Secondary | ICD-10-CM | POA: Diagnosis present

## 2022-01-21 DIAGNOSIS — D860 Sarcoidosis of lung: Secondary | ICD-10-CM | POA: Diagnosis present

## 2022-01-21 DIAGNOSIS — Z8673 Personal history of transient ischemic attack (TIA), and cerebral infarction without residual deficits: Secondary | ICD-10-CM | POA: Diagnosis not present

## 2022-01-21 DIAGNOSIS — Z8249 Family history of ischemic heart disease and other diseases of the circulatory system: Secondary | ICD-10-CM | POA: Diagnosis not present

## 2022-01-21 LAB — CBC WITH DIFFERENTIAL/PLATELET
Abs Immature Granulocytes: 0.03 10*3/uL (ref 0.00–0.07)
Abs Immature Granulocytes: 0.04 10*3/uL (ref 0.00–0.07)
Basophils Absolute: 0 10*3/uL (ref 0.0–0.1)
Basophils Absolute: 0 10*3/uL (ref 0.0–0.1)
Basophils Relative: 0 %
Basophils Relative: 0 %
Eosinophils Absolute: 0.1 10*3/uL (ref 0.0–0.5)
Eosinophils Absolute: 0.1 10*3/uL (ref 0.0–0.5)
Eosinophils Relative: 1 %
Eosinophils Relative: 1 %
HCT: 35 % — ABNORMAL LOW (ref 39.0–52.0)
HCT: 37.5 % — ABNORMAL LOW (ref 39.0–52.0)
Hemoglobin: 11.2 g/dL — ABNORMAL LOW (ref 13.0–17.0)
Hemoglobin: 12.5 g/dL — ABNORMAL LOW (ref 13.0–17.0)
Immature Granulocytes: 0 %
Immature Granulocytes: 0 %
Lymphocytes Relative: 8 %
Lymphocytes Relative: 8 %
Lymphs Abs: 0.6 10*3/uL — ABNORMAL LOW (ref 0.7–4.0)
Lymphs Abs: 0.8 10*3/uL (ref 0.7–4.0)
MCH: 28.9 pg (ref 26.0–34.0)
MCH: 29.6 pg (ref 26.0–34.0)
MCHC: 32 g/dL (ref 30.0–36.0)
MCHC: 33.3 g/dL (ref 30.0–36.0)
MCV: 88.9 fL (ref 80.0–100.0)
MCV: 90.2 fL (ref 80.0–100.0)
Monocytes Absolute: 1 10*3/uL (ref 0.1–1.0)
Monocytes Absolute: 1.1 10*3/uL — ABNORMAL HIGH (ref 0.1–1.0)
Monocytes Relative: 11 %
Monocytes Relative: 12 %
Neutro Abs: 6.2 10*3/uL (ref 1.7–7.7)
Neutro Abs: 7.9 10*3/uL — ABNORMAL HIGH (ref 1.7–7.7)
Neutrophils Relative %: 79 %
Neutrophils Relative %: 80 %
Platelets: 174 10*3/uL (ref 150–400)
Platelets: 188 10*3/uL (ref 150–400)
RBC: 3.88 MIL/uL — ABNORMAL LOW (ref 4.22–5.81)
RBC: 4.22 MIL/uL (ref 4.22–5.81)
RDW: 15.7 % — ABNORMAL HIGH (ref 11.5–15.5)
RDW: 15.8 % — ABNORMAL HIGH (ref 11.5–15.5)
WBC: 7.9 10*3/uL (ref 4.0–10.5)
WBC: 9.9 10*3/uL (ref 4.0–10.5)
nRBC: 0 % (ref 0.0–0.2)
nRBC: 0 % (ref 0.0–0.2)

## 2022-01-21 LAB — COMPREHENSIVE METABOLIC PANEL
ALT: 50 U/L — ABNORMAL HIGH (ref 0–44)
ALT: 59 U/L — ABNORMAL HIGH (ref 0–44)
AST: 37 U/L (ref 15–41)
AST: 49 U/L — ABNORMAL HIGH (ref 15–41)
Albumin: 3.4 g/dL — ABNORMAL LOW (ref 3.5–5.0)
Albumin: 3.7 g/dL (ref 3.5–5.0)
Alkaline Phosphatase: 71 U/L (ref 38–126)
Alkaline Phosphatase: 80 U/L (ref 38–126)
Anion gap: 5 (ref 5–15)
Anion gap: 7 (ref 5–15)
BUN: 17 mg/dL (ref 8–23)
BUN: 17 mg/dL (ref 8–23)
CO2: 25 mmol/L (ref 22–32)
CO2: 26 mmol/L (ref 22–32)
Calcium: 8.5 mg/dL — ABNORMAL LOW (ref 8.9–10.3)
Calcium: 8.9 mg/dL (ref 8.9–10.3)
Chloride: 103 mmol/L (ref 98–111)
Chloride: 106 mmol/L (ref 98–111)
Creatinine, Ser: 1.72 mg/dL — ABNORMAL HIGH (ref 0.61–1.24)
Creatinine, Ser: 1.8 mg/dL — ABNORMAL HIGH (ref 0.61–1.24)
GFR, Estimated: 41 mL/min — ABNORMAL LOW (ref >60)
GFR, Estimated: 44 mL/min — ABNORMAL LOW (ref >60)
Glucose, Bld: 115 mg/dL — ABNORMAL HIGH (ref 70–99)
Glucose, Bld: 94 mg/dL (ref 70–99)
Potassium: 3.2 mmol/L — ABNORMAL LOW (ref 3.5–5.1)
Potassium: 3.8 mmol/L (ref 3.5–5.1)
Sodium: 133 mmol/L — ABNORMAL LOW (ref 135–145)
Sodium: 139 mmol/L (ref 135–145)
Total Bilirubin: 0.9 mg/dL (ref 0.3–1.2)
Total Bilirubin: 1.1 mg/dL (ref 0.3–1.2)
Total Protein: 6.1 g/dL — ABNORMAL LOW (ref 6.5–8.1)
Total Protein: 6.5 g/dL (ref 6.5–8.1)

## 2022-01-21 LAB — I-STAT ARTERIAL BLOOD GAS, ED
Acid-Base Excess: 1 mmol/L (ref 0.0–2.0)
Bicarbonate: 27.8 mmol/L (ref 20.0–28.0)
Calcium, Ion: 1.25 mmol/L (ref 1.15–1.40)
HCT: 33 % — ABNORMAL LOW (ref 39.0–52.0)
Hemoglobin: 11.2 g/dL — ABNORMAL LOW (ref 13.0–17.0)
O2 Saturation: 97 %
Potassium: 3.2 mmol/L — ABNORMAL LOW (ref 3.5–5.1)
Sodium: 138 mmol/L (ref 135–145)
TCO2: 29 mmol/L (ref 22–32)
pCO2 arterial: 51.5 mmHg — ABNORMAL HIGH (ref 32–48)
pH, Arterial: 7.34 — ABNORMAL LOW (ref 7.35–7.45)
pO2, Arterial: 101 mmHg (ref 83–108)

## 2022-01-21 LAB — MAGNESIUM: Magnesium: 2.1 mg/dL (ref 1.7–2.4)

## 2022-01-21 LAB — HIV ANTIBODY (ROUTINE TESTING W REFLEX): HIV Screen 4th Generation wRfx: NONREACTIVE

## 2022-01-21 LAB — RESP PANEL BY RT-PCR (FLU A&B, COVID) ARPGX2
Influenza A by PCR: NEGATIVE
Influenza B by PCR: NEGATIVE
SARS Coronavirus 2 by RT PCR: NEGATIVE

## 2022-01-21 LAB — BRAIN NATRIURETIC PEPTIDE: B Natriuretic Peptide: >4500 pg/mL — ABNORMAL HIGH (ref 0.0–100.0)

## 2022-01-21 LAB — LACTIC ACID, PLASMA: Lactic Acid, Venous: 0.8 mmol/L (ref 0.5–1.9)

## 2022-01-21 LAB — TROPONIN I (HIGH SENSITIVITY)
Troponin I (High Sensitivity): 11 ng/L (ref <18)
Troponin I (High Sensitivity): 13 ng/L (ref <18)

## 2022-01-21 MED ORDER — ACETAMINOPHEN 650 MG RE SUPP: 650.0000 mg | Freq: Four times a day (QID) | RECTAL | Status: DC | PRN

## 2022-01-21 MED ORDER — BUMETANIDE 1 MG PO TABS: 1.0000 mg | ORAL_TABLET | Freq: Every day | ORAL | Status: DC

## 2022-01-21 MED ORDER — ASPIRIN 81 MG PO TBEC
81.0000 mg | DELAYED_RELEASE_TABLET | Freq: Every day | ORAL | Status: DC
  Administered 2022-01-21 – 2022-01-23 (×3): 81 mg via ORAL
  Filled 2022-01-21 (×3): qty 1

## 2022-01-21 MED ORDER — HYDRALAZINE HCL 50 MG PO TABS
100.0000 mg | ORAL_TABLET | Freq: Three times a day (TID) | ORAL | Status: DC
  Administered 2022-01-21 – 2022-01-23 (×8): 100 mg via ORAL
  Filled 2022-01-21 (×8): qty 2

## 2022-01-21 MED ORDER — FUROSEMIDE 10 MG/ML IJ SOLN
60.0000 mg | Freq: Two times a day (BID) | INTRAMUSCULAR | Status: DC
Start: 2022-01-21 — End: 2022-01-22
  Administered 2022-01-21 (×2): 60 mg via INTRAVENOUS
  Filled 2022-01-21 (×2): qty 6

## 2022-01-21 MED ORDER — ISOSORBIDE MONONITRATE ER 60 MG PO TB24
90.0000 mg | ORAL_TABLET | Freq: Every day | ORAL | Status: DC
  Administered 2022-01-21: 90 mg via ORAL
  Filled 2022-01-21: qty 3

## 2022-01-21 MED ORDER — HYDRALAZINE HCL 20 MG/ML IJ SOLN: 10.0000 mg | Freq: Four times a day (QID) | INTRAMUSCULAR | Status: DC | PRN

## 2022-01-21 MED ORDER — LABETALOL HCL 5 MG/ML IV SOLN
20.0000 mg | INTRAVENOUS | Status: DC | PRN
Start: 2022-01-21 — End: 2022-01-23

## 2022-01-21 MED ORDER — GABAPENTIN 300 MG PO CAPS
300.0000 mg | ORAL_CAPSULE | Freq: Every day | ORAL | Status: DC
Start: 2022-01-21 — End: 2022-01-23
  Administered 2022-01-21 – 2022-01-22 (×2): 300 mg via ORAL
  Filled 2022-01-21 (×2): qty 1

## 2022-01-21 MED ORDER — NITROGLYCERIN IN D5W 200-5 MCG/ML-% IV SOLN
0.0000 ug/min | INTRAVENOUS | Status: DC
  Administered 2022-01-21: 5 ug/min via INTRAVENOUS
  Filled 2022-01-21: qty 250

## 2022-01-21 MED ORDER — ACETAMINOPHEN 325 MG PO TABS: 650.0000 mg | ORAL_TABLET | Freq: Four times a day (QID) | ORAL | Status: DC | PRN

## 2022-01-21 MED ORDER — ENOXAPARIN SODIUM 40 MG/0.4ML IJ SOSY
40.0000 mg | PREFILLED_SYRINGE | Freq: Every day | INTRAMUSCULAR | Status: DC
  Administered 2022-01-21 – 2022-01-23 (×3): 40 mg via SUBCUTANEOUS
  Filled 2022-01-21 (×3): qty 0.4

## 2022-01-21 MED ORDER — PREDNISONE 1 MG PO TABS
1.0000 mg | ORAL_TABLET | Freq: Every day | ORAL | Status: DC
  Administered 2022-01-21 – 2022-01-23 (×3): 1 mg via ORAL
  Filled 2022-01-21 (×3): qty 1

## 2022-01-21 MED ORDER — TAMSULOSIN HCL 0.4 MG PO CAPS
0.4000 mg | ORAL_CAPSULE | Freq: Every day | ORAL | Status: DC
  Administered 2022-01-21 – 2022-01-22 (×2): 0.4 mg via ORAL
  Filled 2022-01-21 (×2): qty 1

## 2022-01-21 MED ORDER — METOPROLOL SUCCINATE ER 50 MG PO TB24
200.0000 mg | ORAL_TABLET | Freq: Every day | ORAL | Status: DC
  Administered 2022-01-21 – 2022-01-23 (×3): 200 mg via ORAL
  Filled 2022-01-21: qty 4
  Filled 2022-01-21: qty 8
  Filled 2022-01-21: qty 4

## 2022-01-21 MED ORDER — SACUBITRIL-VALSARTAN 97-103 MG PO TABS
1.0000 | ORAL_TABLET | Freq: Two times a day (BID) | ORAL | Status: DC
  Administered 2022-01-21 – 2022-01-23 (×5): 1 via ORAL
  Filled 2022-01-21 (×6): qty 1

## 2022-01-21 MED ORDER — FLUTICASONE PROPIONATE 50 MCG/ACT NA SUSP: 2.0000 | Freq: Every day | NASAL | Status: DC | PRN

## 2022-01-21 MED ORDER — PANTOPRAZOLE SODIUM 40 MG PO TBEC
40.0000 mg | DELAYED_RELEASE_TABLET | Freq: Every day | ORAL | Status: DC
  Administered 2022-01-21 – 2022-01-23 (×3): 40 mg via ORAL
  Filled 2022-01-21 (×3): qty 1

## 2022-01-21 MED ORDER — DILTIAZEM HCL ER COATED BEADS 120 MG PO CP24
360.0000 mg | ORAL_CAPSULE | Freq: Every day | ORAL | Status: DC
  Administered 2022-01-21 – 2022-01-23 (×3): 360 mg via ORAL
  Filled 2022-01-21 (×3): qty 3

## 2022-01-21 MED ORDER — TRAZODONE HCL 50 MG PO TABS
150.0000 mg | ORAL_TABLET | Freq: Every evening | ORAL | Status: DC | PRN
  Administered 2022-01-21 – 2022-01-22 (×3): 150 mg via ORAL
  Filled 2022-01-21 (×4): qty 3

## 2022-01-21 MED ORDER — ATORVASTATIN CALCIUM 10 MG PO TABS
20.0000 mg | ORAL_TABLET | Freq: Every day | ORAL | Status: DC
Start: 2022-01-21 — End: 2022-01-23
  Administered 2022-01-21 – 2022-01-23 (×3): 20 mg via ORAL
  Filled 2022-01-21 (×3): qty 2

## 2022-01-21 MED ORDER — FUROSEMIDE 10 MG/ML IJ SOLN
80.0000 mg | Freq: Once | INTRAMUSCULAR | Status: AC
  Administered 2022-01-21: 80 mg via INTRAVENOUS
  Filled 2022-01-21: qty 8

## 2022-01-21 MED ORDER — POLYETHYLENE GLYCOL 3350 17 G PO PACK: 17.0000 g | PACK | Freq: Every day | ORAL | Status: DC | PRN

## 2022-01-21 NOTE — Progress Notes (Signed)
PROGRESS NOTE  [name]  YYT:[id number] DOB: Sep 15, 1955 DOA: 01/20/2022 PCP: [name], MD   Brief Narrative:  Patient is a 66 year old male with history of OSA on CPAP, pulm hypertension, sarcoidosis, COPD, GERD, hypertension, hyperlipidemia, CKD stage IIIb, coronary artery disease, PE, diastolic congestive heart failure who presented from home with complaints of shortness of breath, bilateral lower extremity swelling.  On presentation he was hypoxic and had to put on BiPAP.  Lab work showed BNP of 4500.  Patient was started on IV Lasix.  Blood pressure also noted to be high and was placed on nitroglycerin infusion.  Assessment & Plan:  Principal Problem:   Acute on chronic diastolic CHF (congestive heart failure) (HCC) Active Problems:   Hypertensive emergency   Coronary artery disease involving native coronary artery of native heart without angina pectoris   OSA (obstructive sleep apnea)   Stage 3b chronic kidney disease   GERD without esophagitis   Acute respiratory failure with hypoxia (HCC)    Acute on chronic diastolic congestive heart failure: Presented with 2 to 3 days history of dyspnea on exertion, peripheral edema, PND.  BNP severely elevated.  Started on IV Lasix.  Last echo in 2022 showed EF of 60 to 27%, grade 2 diastolic dysfunction.  New echocardiogram pending.  Continue to monitor input/output, daily monitoring of weight. Takes bumex at home  Acute hypoxic respiratory failure: Initially had put on BiPAP now currently weaned and is on 1 to 2 L of oxygen per minute.  We will try to monitor him on room air  Hypertensive emergency: Started on nitroglycerin infusion on presentation due to severe hypertension which might have contributed to CHF also.  Monitor blood pressure .continue home medications. We will titrate the medications as needed  Coronary artery disease: Currently no anginal symptoms.  Continue home regimen of antiplatelet therapy, lipid-lowering  therapy and AV nodal blocking therapy  OSA: Continue CPAP at night  History of pulmonary sarcoidosis: On prednisone 1 mg daily  CKD stage IIIb: Baseline creatinine around 1.5-1.7.  Currently kidney function close to baseline.Follows with Dr Posey Pronto  GERD: Continue PPI      DVT prophylaxis:enoxaparin (LOVENOX) injection 40 mg Start: 01/21/22 1000     Code Status: Full Code  Family Communication: None at bedside  Patient status:Inpatient  Patient is from :Home  Anticipated discharge NT:ZGYF  Estimated DC date:1-2 days   Consultants: None  Procedures:None  Antimicrobials:  Anti-infectives (From admission, onward)    None       Subjective: Patient seen and examined at the bedside this morning.  Hemodynamically stable.  He has been weaned to 2 L of oxygen per minute.  Feels better  Objective: Vitals:   01/21/22 0630 01/21/22 0645 01/21/22 0815 01/21/22 0945  BP: (!) 164/99 (!) 166/93 (!) 160/99 (!) 163/95  Pulse: 71 73 73 67  Resp: (!) 24 (!) 27 (!) 24 19  Temp:      TempSrc:      SpO2: 97% 96% 98% 98%  Weight:      Height:        Intake/Output Summary (Last 24 hours) at 01/21/2022 1305 Last data filed at 01/21/2022 7494 Gross per 24 hour  Intake 42.41 ml  Output 2725 ml  Net -2682.59 ml   Filed Weights   01/20/22 2348  Weight: 100.7 kg    Examination:  General exam: Overall comfortable, not in distress HEENT: PERRL Respiratory system:  no wheezes or crackles  Cardiovascular system: S1 & S2 heard,  RRR.  Gastrointestinal system: Abdomen is nondistended, soft and nontender. Central nervous system: Alert and oriented Extremities: Trace bilateral lower extremity  edema, no clubbing ,no cyanosis Skin: No rashes, no ulcers,no icterus     Data Reviewed: I have personally reviewed following labs and imaging studies  CBC: Recent Labs  Lab 01/20/22 2352 01/21/22 0404 01/21/22 0738  WBC 9.9  --  7.9  NEUTROABS 7.9*  --  6.2  HGB 12.5* 11.2* 11.2*   HCT 37.5* 33.0* 35.0*  MCV 88.9  --  90.2  PLT 188  --  175   Basic Metabolic Panel: Recent Labs  Lab 01/20/22 2352 01/21/22 0404 01/21/22 0738  NA 133* 138 139  K 3.8 3.2* 3.2*  CL 103  --  106  CO2 25  --  26  GLUCOSE 115*  --  94  BUN 17  --  17  CREATININE 1.72*  --  1.80*  CALCIUM 8.9  --  8.5*  MG  --   --  2.1     Recent Results (from the past 240 hour(s))  Resp Panel by RT-PCR (Flu A&B, Covid) Nasopharyngeal Swab     Status: None   Collection Time: 01/21/22 12:24 AM   Specimen: Nasopharyngeal Swab; Nasopharyngeal(NP) swabs in vial transport medium  Result Value Ref Range Status   SARS Coronavirus 2 by RT PCR NEGATIVE NEGATIVE Final    Comment: (NOTE) SARS-CoV-2 target nucleic acids are NOT DETECTED.  The SARS-CoV-2 RNA is generally detectable in upper respiratory specimens during the acute phase of infection. The lowest concentration of SARS-CoV-2 viral copies this assay can detect is 138 copies/mL. A negative result does not preclude SARS-Cov-2 infection and should not be used as the sole basis for treatment or other patient management decisions. A negative result may occur with  improper specimen collection/handling, submission of specimen other than nasopharyngeal swab, presence of viral mutation(s) within the areas targeted by this assay, and inadequate number of viral copies(<138 copies/mL). A negative result must be combined with clinical observations, patient history, and epidemiological information. The expected result is Negative.  Fact Sheet for Patients:  EntrepreneurPulse.com.au  Fact Sheet for Healthcare Providers:  IncredibleEmployment.be  This test is no t yet approved or cleared by the [place] FDA and  has been authorized for detection and/or diagnosis of SARS-CoV-2 by FDA under an Emergency Use Authorization (EUA). This EUA will remain  in effect (meaning this test can be used) for the duration of  the COVID-19 declaration under Section 564(b)(1) of the Act, 21 U.S.C.section 360bbb-3(b)(1), unless the authorization is terminated  or revoked sooner.       Influenza A by PCR NEGATIVE NEGATIVE Final   Influenza B by PCR NEGATIVE NEGATIVE Final    Comment: (NOTE) The Xpert Xpress SARS-CoV-2/FLU/RSV plus assay is intended as an aid in the diagnosis of influenza from Nasopharyngeal swab specimens and should not be used as a sole basis for treatment. Nasal washings and aspirates are unacceptable for Xpert Xpress SARS-CoV-2/FLU/RSV testing.  Fact Sheet for Patients: EntrepreneurPulse.com.au  Fact Sheet for Healthcare Providers: IncredibleEmployment.be  This test is not yet approved or cleared by the [place] FDA and has been authorized for detection and/or diagnosis of SARS-CoV-2 by FDA under an Emergency Use Authorization (EUA). This EUA will remain in effect (meaning this test can be used) for the duration of the COVID-19 declaration under Section 564(b)(1) of the Act, 21 U.S.C. section 360bbb-3(b)(1), unless the authorization is terminated or revoked.  Performed at [facility]  [facility], [place] [address], [place], [phone]      Radiology Studies: DG Chest Port 1 View  Result Date: 01/21/2022 CLINICAL DATA:  Shortness of breath EXAM: PORTABLE CHEST 1 VIEW COMPARISON:  CT chest dated 01/15/2022 FINDINGS: Mild scarring in the right mid lung. Mild scarring/atelectasis in the left lower lobe. Mild nodular opacity in the left mid lung. These are all better evaluated on recent CT. No focal consolidation or frank interstitial edema. No pleural effusion or pneumothorax. Cardiomegaly. Mild thoracic atherosclerosis. IMPRESSION: No evidence of acute cardiopulmonary disease. Stable nodular opacity in the left mid lung, better evaluated on recent CT. Scattered scarring/atelectasis. Electronically Signed   By: Julian Hy M.D.   On: 01/21/2022  00:19    Scheduled Meds:  aspirin EC  81 mg Oral Daily   atorvastatin  20 mg Oral Daily   diltiazem  360 mg Oral Daily   enoxaparin (LOVENOX) injection  40 mg Subcutaneous Daily   furosemide  60 mg Intravenous BID   gabapentin  300 mg Oral QHS   hydrALAZINE  100 mg Oral Q8H   isosorbide mononitrate  90 mg Oral Daily   metoprolol  200 mg Oral Daily   pantoprazole  40 mg Oral Daily   predniSONE  1 mg Oral Q breakfast   sacubitril-valsartan  1 tablet Oral BID   tamsulosin  0.4 mg Oral QPC supper   Continuous Infusions:   LOS: 0 days   Shelly Coss, MD [facility] P5/18/2023, 1:05 PM

## 2022-01-21 NOTE — ED Notes (Signed)
Condom cath replaced. Linen changed and peri care performed.

## 2022-01-21 NOTE — Assessment & Plan Note (Signed)
?   Patient presenting with symptoms of 2 to 3-day history of dyspnea on exertion, peripheral edema and paroxysmal nocturnal dyspnea consistent with acute on chronic diastolic congestive heart failure. ?? Work-up reveals markedly elevated BNP.  Pulmonary edema is not clear based on chest x-ray however film is obscured by scarring due to known pulmonary sarcoidosis ?? Patient has been placed on intravenous diuretics. ?? Strict input and output monitoring ?? Patient was temporarily on BiPAP overnight has been transitioned off.  We will continue to provide supplemental oxygen as necessary for bouts of hypoxia. ?? Monitoring renal function and electrolytes with serial chemistires ?? Daily weights ?? Monitoring patient on telemetry ? ? ? ?

## 2022-01-21 NOTE — ED Notes (Signed)
Patient was incont of stool. Linen changed and peri care performed. Condom cath emptied.

## 2022-01-21 NOTE — H&P (Signed)
History and Physical    [name] MRN: [id number] DOA: 01/20/2022  Date of Service: the patient was seen and examined on 01/21/2022  Patient coming from: Home via EMS  Chief Complaint:  Chief Complaint  Patient presents with   Shortness of Breath    HPI:   66 year old male with past medical history of obstructive sleep apnea on CPAP, mild pulmonary hypertension, pulmonary sarcoidosis, COPD, gastroesophageal reflux disease, hypertension, hyperlipidemia,  chronic kidney disease stage IIIb (baseline Cr 1.5 to 1.7), coronary artery disease (Cath 05/2021 nonobstructive disease including 70% distal RCA stenosis , medically managed), history of PE (completed 1 year of Xarelto), MSSA Bacteremia (12/9447), diastolic congestive heart failure (Echo 03/2021 EF 60-65% with G1DD) presenting to [facility] emergency department via EMS with complaints of shortness of breath.  Patient explains that over the past several days he has noted that his legs have been swelling up more than usual.  Patient been instructed to take as needed Bumex for volume overload and states that he has taken it without improvement.  As patient's legs have continued to swell patient has begun to develop associated shortness of breath.  Earlier in the day on 5/17 patient developed severe shortness of breath.  This progressively worsened throughout the day.  Shortness of breath is worse with exertion and improved with rest.  Patient denies fevers, cough, sick contacts, recent travel or contact with confirmed COVID-19 infection.  Due to progressively worsening symptoms EMS was eventually contacted who promptly came to evaluate the patient and then brought him into [facility] emergency department for evaluation.  Upon evaluation in the emergency department patient was clinically felt to be suffering from acute congestive heart failure with a BNP of greater than 4500.  A milligrams of intravenous Lasix was  administered.  Patient was then placed on BiPAP therapy due to work of breathing.  Patient was additionally placed on a nitroglycerin infusion due to markedly elevated blood pressures as high as 184/110.  The hospitalist group was then called to assess the patient for admission to the hospital.  Review of Systems: Review of Systems  Respiratory:  Positive for shortness of breath.   Cardiovascular:  Positive for leg swelling.  All other systems reviewed and are negative.   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   ED (erectile dysfunction)    GERD (gastroesophageal reflux disease)    Hypercalcemia    Hyperlipidemia    Hypertension    Insomnia    Long-term use of high-risk medication    Nephrocalcinosis    Nephrolithiasis    Obesity    Osteopenia    Pancreatitis    admitted 03/19/01-06/05/01   PE (pulmonary embolism) 01/30/2013   Proteinuria    Sarcoidosis    Sleep apnea    Smoker    Stroke [facility])     Past Surgical History:  Procedure Laterality Date   ABDOMINAL EXPLORATION SURGERY     BACK SURGERY     CHOLECYSTECTOMY     RIGHT/LEFT HEART CATH AND CORONARY ANGIOGRAPHY N/A 05/12/2021   Procedure: RIGHT/LEFT HEART CATH AND CORONARY ANGIOGRAPHY;  Surgeon: [name], MD;  Location: [facility];  Service: Cardiovascular;  Laterality: N/A;   SHOULDER ARTHROSCOPY Right 10/30/2020   Procedure: ARTHROSCOPY SHOULDER WITH EXTENSIVE DEBRIDEMENT;  Surgeon: Mcarthur Rossetti, MD;  Location: [facility];  Service: Orthopedics;  Laterality: Right;   SHOULDER SURGERY     TRACHEOSTOMY     closed    Social History:  reports that he quit smoking about 10 months ago. His smoking use included cigarettes. He started smoking  about 16 years ago. He has a 3.75 pack-year smoking history. He has never used smokeless tobacco. He reports that he does not drink alcohol and does not use drugs.  Allergies  Allergen Reactions   Codeine Nausea Only   Hydrochlorothiazide     Unknown   Hydromorphone Other (See Comments)    hallucinations     Family History  Problem Relation Age of Onset   Hypertension Father    CVA Father    Lung cancer Father    Alzheimer's disease Mother    Hypertension Sister    Heart failure Sister     Prior to Admission medications   Medication Sig Start Date End Date Taking? Authorizing Provider  aspirin 81 MG tablet Take 1 tablet (81 mg total) by mouth daily. 02/01/13   [name], MD  atorvastatin (LIPITOR) 20 MG tablet Take 1 tablet by mouth daily.    [provider]  bumetanide (BUMEX) 1 MG tablet Take 1 tablet (1 mg total) by mouth daily. 12/22/21   Tolia, Sunit, DO  cefdinir (OMNICEF) 300 MG capsule Take 1 capsule by mouth 2 times  a day for 10 days as directed 11/13/21     dapagliflozin propanediol (FARXIGA) 10 MG TABS tablet Take 1 tablet (10 mg total) by mouth daily. 05/15/21 02/08/22  Patwardhan, Reynold Bowen, MD  diltiazem (TIAZAC) 360 MG 24 hr capsule TAKE ONE CAPSULE BY MOUTH EVERY DAY 10/22/21     fluticasone (FLONASE) 50 MCG/ACT nasal spray Place 2 sprays into both nostrils daily as needed for allergies or rhinitis.    [provider]  gabapentin (NEURONTIN) 300 MG capsule Take 1 capsule by mouth at bedtime for pain 11/17/21     hydrALAZINE (APRESOLINE) 100 MG tablet Take 1 tablet (100 mg total) by mouth 3 (three) times daily. 10/07/21     isosorbide mononitrate (IMDUR) 30 MG 24 hr tablet Take 3 tablets by mouth daily. 04/29/21   Katherine Roan, MD  metoprolol (TOPROL-XL) 200 MG 24 hr tablet Take 1 tablet by mouth once daily 05/25/21     potassium chloride SA (KLOR-CON M) 20 MEQ tablet Take 1 tablet  by mouth daily. 11/25/21   Tolia, Sunit, DO  predniSONE (DELTASONE) 1 MG  tablet Take 1 tablet by mouth daily with breakfast. 10/23/21 01/21/22  Icard, Octavio Graves, DO  sacubitril-valsartan (ENTRESTO) 97-103 MG Take 1 tablet by mouth 2 (two) times daily. 10/26/21   Cantwell, Celeste C, PA-C  tamsulosin (FLOMAX) 0.4 MG CAPS capsule Take 1 capsule by mouth daily - need appointment for more refills 06/17/21     traZODone (DESYREL) 150 MG tablet Take 1 tablet (150 mg total) by mouth at bedtime as needed for sleep. 12/22/20   Dohmeier, Asencion Partridge, MD  triamcinolone cream (KENALOG) 0.1 % Apply to affected areas twice a day as needed for itching/inflammation. 09/09/21       Physical Exam:  Vitals:   01/21/22 0530 01/21/22 0630 01/21/22 0645 01/21/22 0815  BP: (!) 151/108 (!) 164/99 (!) 166/93 (!) 160/99  Pulse: 65 71 73 73  Resp: (!) 27 (!)  24 (!) 27 (!) 24  Temp:      TempSrc:      SpO2: 100% 97% 96% 98%  Weight:      Height:        Constitutional: Awake alert and oriented x3, no associated distress.   Skin: no rashes, no lesions, good skin turgor noted. Eyes: Pupils are equally reactive to light.  No evidence of scleral icterus or conjunctival pallor.  ENMT: Moist mucous membranes noted.  Posterior pharynx clear of any exudate or lesions.   Neck: normal, supple, no masses, no thyromegaly.  Markedly elevated jugular venous pulse noted.   Respiratory: Notable bibasilar and mid field rales.  No evidence of significant wheezing.  Minimal increased respiratory effort with mild assessor muscle use.   Cardiovascular: Regular rate and rhythm, no murmurs / rubs / gallops.  Bilateral lower extremity pitting edema that tracks in the feet up to just below the knees.  2+ pedal pulses. No carotid bruits.  Chest:   Nontender without crepitus or deformity.   Back:   Nontender without crepitus or deformity. Abdomen: Abdomen is soft and nontender.  No evidence of intra-abdominal masses.  Positive bowel sounds noted in all quadrants.   Musculoskeletal: No joint deformity upper and lower  extremities. Good ROM, no contractures. Normal muscle tone.  Neurologic: CN 2-12 grossly intact. Sensation intact.  Patient moving all 4 extremities spontaneously.  Patient is following all commands.  Patient is responsive to verbal stimuli.   Psychiatric: Patient exhibits normal mood with appropriate affect.  Patient seems to possess insight as to their current situation.    Data Reviewed:  I have personally reviewed and interpreted labs, imaging.  Significant findings are:  Hemoglobin 12.5, hematocrit 37.5 Sodium 133, creatinine 1.72. Chest x-ray personally reviewed.  Notable interstitial markings with notable scarring in the left lower lobe and a nodular opacity in the left midlung without obvious edema.  EKG: Personally reviewed.  Rhythm is normal sinus rhythm with heart rate of 83 bpm.  Left anterior fascicular block.  No dynamic ST segment changes appreciated.   Assessment and Plan: * Acute on chronic diastolic CHF (congestive heart failure) (HCC) Patient presenting with symptoms of 2 to 3-day history of dyspnea on exertion, peripheral edema and paroxysmal nocturnal dyspnea consistent with acute on chronic diastolic congestive heart failure. Work-up reveals markedly elevated BNP.  Pulmonary edema is not clear based on chest x-ray however film is obscured by scarring due to known pulmonary sarcoidosis Patient has been placed on intravenous diuretics. Strict input and output monitoring Patient was temporarily on BiPAP overnight has been transitioned off.  We will continue to provide supplemental oxygen as necessary for bouts of hypoxia. Monitoring renal function and electrolytes with serial chemistires Daily weights Monitoring patient on telemetry     Hypertensive emergency ER provider initiated nitroglycerin infusion due to concerns for severe hypertension contributing to patient's presentation Blood pressures are slowly improving We will attempt to resume home regimen of  antihypertensive therapy and slowly wean nitroglycerin infusion simultaneously  Coronary artery disease involving native coronary artery of native heart without angina pectoris Patient is currently chest pain free Monitoring patient on telemetry Continue home regimen of antiplatelet therapy, lipid lowering therapy and AV nodal blocking therapy   OSA (obstructive sleep apnea) Continuing home regimen of CPAP nightly   Stage 3b chronic kidney disease Strict intake and output monitoring Creatinine near baseline Minimizing nephrotoxic agents as much as possible Serial chemistries to monitor renal function and electrolytes   GERD  without esophagitis Continuing home regimen of daily PPI therapy.        Code Status:  Full code  code status decision has been confirmed with: patient Family Communication: deferred   Consults: none  Severity of Illness:  The appropriate patient status for this patient is INPATIENT. Inpatient status is judged to be reasonable and necessary in order to provide the required intensity of service to ensure the patient's safety. The patient's presenting symptoms, physical exam findings, and initial radiographic and laboratory data in the context of their chronic comorbidities is felt to place them at high risk for further clinical deterioration. Furthermore, it is not anticipated that the patient will be medically stable for discharge from the hospital within 2 midnights of admission.   * I certify that at the point of admission it is my clinical judgment that the patient will require inpatient hospital care spanning beyond 2 midnights from the point of admission due to high intensity of service, high risk for further deterioration and high frequency of surveillance required.*  Author:  Vernelle Emerald MD  01/21/2022 9:23 AM

## 2022-01-21 NOTE — Progress Notes (Signed)
Pt taken off Bipap at this time for a break. Pt placed on 3LNC, pt looks comfortable

## 2022-01-21 NOTE — Assessment & Plan Note (Signed)
.   Continuing home regimen of CPAP nightly  

## 2022-01-21 NOTE — Assessment & Plan Note (Signed)
Strict intake and output monitoring Creatinine near baseline Minimizing nephrotoxic agents as much as possible Serial chemistries to monitor renal function and electrolytes  

## 2022-01-21 NOTE — ED Notes (Signed)
This RN called the lab to check on the status of the BNP - was informed it was "never received in." They stated they will add it on the previous lab collection at this time. Dr. Betsey Holiday informed.

## 2022-01-21 NOTE — ED Provider Notes (Signed)
[facility] EMERGENCY DEPARTMENT Provider Note   CSN: [id number] Arrival date & time: 01/20/22  2344     History  Chief Complaint  Patient presents with   Shortness of Breath    [name] is a 66 y.o. male.  Patient presents to the emergency department for evaluation of shortness of breath.  Patient has been experiencing shortness of breath and increasing swelling of his legs over the last few days.  He takes Bumex intermittently, has been taking it for the last few days without improvement.  Patient became very short of breath tonight.  No associated chest pain.  EMS report room air oxygen saturation of 82% upon their arrival.  He does not use oxygen at home.  Patient very hypertensive as well.  Patient placed on CPAP and given sublingual nitroglycerin.      Home Medications Prior to Admission medications   Medication Sig Start Date End Date Taking? Authorizing Provider  aspirin 81 MG tablet Take 1 tablet (81 mg total) by mouth daily. 02/01/13   [name], MD  atorvastatin (LIPITOR) 20 MG tablet Take 1 tablet by mouth daily.    [provider]  bumetanide (BUMEX) 1 MG tablet Take 1 tablet (1 mg total) by mouth daily. 12/22/21   Tolia, Sunit, DO  cefdinir (OMNICEF) 300 MG capsule Take 1 capsule by mouth 2 times  a day for 10 days as directed 11/13/21     dapagliflozin propanediol (FARXIGA) 10 MG TABS tablet Take 1 tablet (10 mg total) by mouth daily. 05/15/21 02/08/22  Patwardhan, Reynold Bowen, MD  diltiazem (TIAZAC) 360 MG 24 hr capsule TAKE ONE CAPSULE BY MOUTH EVERY DAY 10/22/21     fluticasone (FLONASE) 50 MCG/ACT nasal spray Place 2 sprays into both nostrils daily as needed for allergies or rhinitis.    [provider]  gabapentin (NEURONTIN) 300 MG capsule Take 1 capsule by mouth at bedtime for pain 11/17/21     hydrALAZINE (APRESOLINE) 100 MG tablet Take 1 tablet (100 mg total) by mouth 3 (three) times daily. 10/07/21     isosorbide mononitrate  (IMDUR) 30 MG 24 hr tablet Take 3 tablets by mouth daily. 04/29/21   Katherine Roan, MD  metoprolol (TOPROL-XL) 200 MG 24 hr tablet Take 1 tablet by mouth once daily 05/25/21     potassium chloride SA (KLOR-CON M) 20 MEQ tablet Take 1 tablet  by mouth daily. 11/25/21   Tolia, Sunit, DO  predniSONE (DELTASONE) 1 MG tablet Take 1 tablet by mouth daily with breakfast. 10/23/21 01/21/22  Icard, Octavio Graves, DO  sacubitril-valsartan (ENTRESTO) 97-103 MG Take 1 tablet by mouth 2 (two) times daily. 10/26/21   Cantwell, Celeste C, PA-C  tamsulosin (FLOMAX) 0.4 MG CAPS capsule Take 1 capsule by mouth daily - need appointment for more refills 06/17/21     traZODone (DESYREL) 150 MG tablet Take 1 tablet (150 mg total) by mouth at bedtime as needed for sleep. 12/22/20   Dohmeier, Asencion Partridge, MD  triamcinolone cream (KENALOG) 0.1 % Apply to affected areas twice a day as needed for itching/inflammation. 09/09/21         Allergies    Codeine, Hydrochlorothiazide, and Hydromorphone    Review of Systems   Review of Systems  Physical Exam Updated Vital Signs BP (!) 172/92   Pulse 68   Temp 99.4 F (37.4 C) (Axillary)   Resp 15   Ht '5\' 11"'$  (1.803 m)   Wt 100.7 kg   SpO2 99%  BMI 30.96 kg/m  Physical Exam Vitals and nursing note reviewed.  Constitutional:      General: He is in acute distress.     Appearance: He is well-developed.  HENT:     Head: Normocephalic and atraumatic.     Mouth/Throat:     Mouth: Mucous membranes are moist.  Eyes:     General: Vision grossly intact. Gaze aligned appropriately.     Extraocular Movements: Extraocular movements intact.     Conjunctiva/sclera: Conjunctivae normal.  Neck:     Vascular: JVD present.  Cardiovascular:     Rate and Rhythm: Normal rate and regular rhythm.     Pulses: Normal pulses.     Heart sounds: Normal heart sounds, S1 normal and S2 normal. No murmur heard.   No friction rub. No gallop.  Pulmonary:     Effort: Tachypnea, accessory muscle usage  and respiratory distress present.     Breath sounds: Decreased breath sounds present.  Abdominal:     Palpations: Abdomen is soft.     Tenderness: There is no abdominal tenderness. There is no guarding or rebound.     Hernia: No hernia is present.  Musculoskeletal:        General: No swelling.     Cervical back: Full passive range of motion without pain, normal range of motion and neck supple. No pain with movement, spinous process tenderness or muscular tenderness. Normal range of motion.     Right lower leg: Edema present.     Left lower leg: Edema present.  Skin:    General: Skin is warm and dry.     Capillary Refill: Capillary refill takes less than 2 seconds.     Findings: No ecchymosis, erythema, lesion or wound.  Neurological:     Mental Status: He is alert and oriented to person, place, and time.     GCS: GCS eye subscore is 4. GCS verbal subscore is 5. GCS motor subscore is 6.     Cranial Nerves: Cranial nerves 2-12 are intact.     Sensory: Sensation is intact.     Motor: Motor function is intact. No weakness or abnormal muscle tone.     Coordination: Coordination is intact.  Psychiatric:        Mood and Affect: Mood normal.        Speech: Speech normal.        Behavior: Behavior normal.    ED Results / Procedures / Treatments   Labs (all labs ordered are listed, but only abnormal results are displayed) Labs Reviewed  CBC WITH DIFFERENTIAL/PLATELET - Abnormal; Notable for the following components:      Result Value   Hemoglobin 12.5 (*)    HCT 37.5 (*)    RDW 15.8 (*)    Neutro Abs 7.9 (*)    Monocytes Absolute 1.1 (*)    All other components within normal limits  COMPREHENSIVE METABOLIC PANEL - Abnormal; Notable for the following components:   Sodium 133 (*)    Glucose, Bld 115 (*)    Creatinine, Ser 1.72 (*)    AST 49 (*)    ALT 59 (*)    GFR, Estimated 44 (*)    All other components within normal limits  BRAIN NATRIURETIC PEPTIDE - Abnormal; Notable for the  following components:   B Natriuretic Peptide >4,500.0 (*)    All other components within normal limits  RESP PANEL BY RT-PCR (FLU A&B, COVID) [id number]  LACTIC ACID, PLASMA  TROPONIN I (HIGH SENSITIVITY)  TROPONIN I (HIGH SENSITIVITY)    EKG EKG Interpretation  Date/Time:  Wednesday Jan 20 2022 23:47:19 EDT Ventricular Rate:  83 PR Interval:  180 QRS Duration: 101 QT Interval:  418 QTC Calculation: 492 R Axis:   -76 Text Interpretation: Sinus rhythm Probable left atrial enlargement LAD, consider left anterior fascicular block Borderline prolonged QT interval Confirmed by Orpah Greek [phone]) on 01/21/2022 1:54:15 AM  Radiology DG Chest Port 1 View  Result Date: 01/21/2022 CLINICAL DATA:  Shortness of breath EXAM: PORTABLE CHEST 1 VIEW COMPARISON:  CT chest dated 01/15/2022 FINDINGS: Mild scarring in the right mid lung. Mild scarring/atelectasis in the left lower lobe. Mild nodular opacity in the left mid lung. These are all better evaluated on recent CT. No focal consolidation or frank interstitial edema. No pleural effusion or pneumothorax. Cardiomegaly. Mild thoracic atherosclerosis. IMPRESSION: No evidence of acute cardiopulmonary disease. Stable nodular opacity in the left mid lung, better evaluated on recent CT. Scattered scarring/atelectasis. Electronically Signed   By: Julian Hy M.D.   On: 01/21/2022 00:19    Procedures Procedures    Medications Ordered in ED Medications  nitroGLYCERIN 50 mg in dextrose 5 % 250 mL (0.2 mg/mL) infusion (20 mcg/min Intravenous Rate/Dose Change 01/21/22 0256)  furosemide (LASIX) injection 80 mg (80 mg Intravenous Given 01/21/22 0013)    ED Course/ Medical Decision Making/ A&P                           Medical Decision Making Amount and/or Complexity of Data Reviewed Labs: ordered. Radiology: ordered.  Risk Prescription drug management.   Patient with COPD and CHF presents to the emergency department with shortness of  breath.  Patient has noticed increased swelling of his lower extremities over the last couple of days.  He has been taking his Bumex but has not noticed improvement.  Patient acutely became distressed tonight and was brought to the ER.  EMS found him very hypoxic, tachypneic and distressed, placed him on CPAP and brought him to the ER.  Frontal diagnosis considered includes unstable angina/ACS, hypertensive urgency, congestive heart failure exacerbation, COPD exacerbation, pneumonia.  Examination reveals JVD, bilateral lower extremity pitting edema.  Chest x-ray performed at arrival was independently reviewed by myself and did appear to show signs of congestive heart failure, although radiology interpretation reports no acute process.  Presentation consistent with hypertensive urgency with CHF.  Patient placed on nitroglycerin drip for blood pressure controlled and decreased preload.  BiPAP continued at arrival.  Patient given IV Lasix for diuresis.  He has improved, will admit to hospitalist service for further management.  CRITICAL CARE Performed by: Orpah Greek   Total critical care time: 35 minutes  Critical care time was exclusive of separately billable procedures and treating other patients.  Critical care was necessary to treat or prevent imminent or life-threatening deterioration.  Critical care was time spent personally by me on the following activities: development of treatment plan with patient and/or surrogate as well as nursing, discussions with consultants, evaluation of patient's response to treatment, examination of patient, obtaining history from patient or surrogate, ordering and performing treatments and interventions, ordering and review of laboratory studies, ordering and review of radiographic studies, pulse oximetry and re-evaluation of patient's condition.         Final Clinical Impression(s) / ED Diagnoses Final diagnoses:  Acute on chronic congestive  heart failure, unspecified heart failure type ([place])  Acute respiratory failure with hypoxia (HCC)  Rx / DC Orders ED Discharge Orders     None         Demeco Ducksworth, Gwenyth Allegra, MD 01/21/22 [phone]

## 2022-01-21 NOTE — Assessment & Plan Note (Signed)
   ER provider initiated nitroglycerin infusion due to concerns for severe hypertension contributing to patient's presentation  Blood pressures are slowly improving  We will attempt to resume home regimen of antihypertensive therapy and slowly wean nitroglycerin infusion simultaneously

## 2022-01-21 NOTE — Plan of Care (Signed)

## 2022-01-21 NOTE — Assessment & Plan Note (Signed)
•   Patient is currently chest pain free °• Monitoring patient on telemetry °• Continue home regimen of antiplatelet therapy, lipid lowering therapy and AV nodal blocking therapy ° °

## 2022-01-21 NOTE — Assessment & Plan Note (Signed)
Continuing home regimen of daily PPI therapy.  

## 2022-01-21 NOTE — ED Notes (Signed)
Pt found standing in room with Bipap disconnected. Pt had a bowel movement and was attempting to clean self up. Pt cleaned and assisted back to bed and educated on use of call light for assistance.

## 2022-01-22 DIAGNOSIS — I5033 Acute on chronic diastolic (congestive) heart failure: Secondary | ICD-10-CM | POA: Diagnosis not present

## 2022-01-22 LAB — BLOOD GAS, ARTERIAL
Acid-Base Excess: 8.5 mmol/L — ABNORMAL HIGH (ref 0.0–2.0)
Bicarbonate: 35.3 mmol/L — ABNORMAL HIGH (ref 20.0–28.0)
O2 Saturation: 98.5 %
Patient temperature: 36.6
pCO2 arterial: 56 mmHg — ABNORMAL HIGH (ref 32–48)
pH, Arterial: 7.41 (ref 7.35–7.45)
pO2, Arterial: 88 mmHg (ref 83–108)

## 2022-01-22 LAB — BASIC METABOLIC PANEL
Anion gap: 8 (ref 5–15)
BUN: 17 mg/dL (ref 8–23)
CO2: 30 mmol/L (ref 22–32)
Calcium: 9 mg/dL (ref 8.9–10.3)
Chloride: 106 mmol/L (ref 98–111)
Creatinine, Ser: 1.77 mg/dL — ABNORMAL HIGH (ref 0.61–1.24)
GFR, Estimated: 42 mL/min — ABNORMAL LOW (ref >60)
Glucose, Bld: 148 mg/dL — ABNORMAL HIGH (ref 70–99)
Potassium: 2.9 mmol/L — ABNORMAL LOW (ref 3.5–5.1)
Sodium: 144 mmol/L (ref 135–145)

## 2022-01-22 MED ORDER — ISOSORBIDE MONONITRATE ER 60 MG PO TB24
120.0000 mg | ORAL_TABLET | Freq: Every day | ORAL | Status: DC
  Administered 2022-01-22 – 2022-01-23 (×2): 120 mg via ORAL
  Filled 2022-01-22 (×2): qty 2

## 2022-01-22 MED ORDER — POTASSIUM CHLORIDE CRYS ER 20 MEQ PO TBCR
40.0000 meq | EXTENDED_RELEASE_TABLET | ORAL | Status: AC
  Administered 2022-01-22 (×2): 40 meq via ORAL
  Filled 2022-01-22: qty 2

## 2022-01-22 MED ORDER — FUROSEMIDE 10 MG/ML IJ SOLN
60.0000 mg | Freq: Two times a day (BID) | INTRAMUSCULAR | Status: DC
  Administered 2022-01-22: 60 mg via INTRAVENOUS
  Filled 2022-01-22 (×2): qty 6

## 2022-01-22 NOTE — Progress Notes (Signed)
SATURATION QUALIFICATIONS: (This note is used to comply with regulatory documentation for home oxygen)  Patient Saturations on Room Air at Rest = 94%  Patient Saturations on Room Air while Ambulating = 90%  Patient Saturations on n/a Liters of oxygen while Ambulating = n/a  Please briefly explain why patient needs home oxygen: N/A

## 2022-01-22 NOTE — Progress Notes (Signed)
PROGRESS NOTE  [name]  YBW:[id number] DOB: 1956/08/11 DOA: 01/20/2022 PCP: [name], MD   Brief Narrative:  Patient is a 66 year old male with history of OSA on CPAP, pulm hypertension, sarcoidosis, COPD, GERD, hypertension, hyperlipidemia, CKD stage IIIb, coronary artery disease, PE, diastolic congestive heart failure who presented from home with complaints of shortness of breath, bilateral lower extremity swelling.  On presentation he was hypoxic and had to put on BiPAP.  Lab work showed BNP of 4500.  Patient was started on IV Lasix.  Blood pressure also noted to be high and was placed on nitroglycerin infusion.  Assessment & Plan:  Principal Problem:   Acute on chronic diastolic CHF (congestive heart failure) (HCC) Active Problems:   Hypertensive emergency   Coronary artery disease involving native coronary artery of native heart without angina pectoris   OSA (obstructive sleep apnea)   Stage 3b chronic kidney disease   GERD without esophagitis   Acute respiratory failure with hypoxia (HCC)    Acute on chronic diastolic congestive heart failure: Presented with 2 to 3 days history of dyspnea on exertion, peripheral edema, PND.  BNP severely elevated.  Started on IV Lasix.  Last echo in 2022 showed EF of 60 to 76%, grade 2 diastolic dysfunction.  New echocardiogram pending.  Continue to monitor input/output, daily monitoring of weight. Takes bumex at home  Acute hypoxic respiratory failure: Initially had put on BiPAP now currently weaned and is on 1 to 2 L of oxygen per minute.  We tried  to monitor him on room air but he desaturated.  We will continue IV Lasix, will check ABG because he was also noted to be sleepy.  We will check if he qualifies for home oxygen  Hypertensive emergency: Started on nitroglycerin infusion on presentation due to severe hypertension which might have contributed to CHF also.  Monitor blood pressure .continue home medications. Dose of Imdur  increased  Coronary artery disease: Currently no anginal symptoms.  Continue home regimen of antiplatelet therapy, lipid-lowering therapy and AV nodal blocking therapy  OSA: Continue CPAP at night  History of pulmonary sarcoidosis: On prednisone 1 mg daily  CKD stage IIIb: Baseline creatinine around 1.5-1.7.  Currently kidney function close to baseline.Follows with Dr Posey Pronto.  Monitor BMP while on Lasix  GERD: Continue PPI      DVT prophylaxis:enoxaparin (LOVENOX) injection 40 mg Start: 01/21/22 1000     Code Status: Full Code  Family Communication: None at bedside  Patient status:Inpatient  Patient is from :Home  Anticipated discharge OT:LXBW  Estimated DC date:1-2 days   Consultants: None  Procedures:None  Antimicrobials:  Anti-infectives (From admission, onward)    None       Subjective: Patient seen and examined at the bedside this morning.  Hemodynamically stable.  He feels comfortable.  Bilateral lower extremity edema has completely resolved.  He was still  on 2 L of oxygen per minute.  Objective: Vitals:   01/21/22 1601 01/21/22 2043 01/22/22 0611 01/22/22 0644  BP: (!) 156/102 (!) 163/80 (!) 149/80 (!) 158/92  Pulse: 69 82 (!) 58 64  Resp: '17 18  20  '$ Temp: 98.3 F (36.8 C) 98.3 F (36.8 C)  97.9 F (36.6 C)  TempSrc: Oral Oral  Oral  SpO2: 96% 98%  91%  Weight:      Height:        Intake/Output Summary (Last 24 hours) at 01/22/2022 1140 Last data filed at 01/22/2022 0220 Gross per 24 hour  Intake 480  ml  Output 2000 ml  Net -1520 ml   Filed Weights   01/20/22 2348  Weight: 100.7 kg    Examination:  General exam: Overall comfortable, not in distress HEENT: PERRL Respiratory system:  no wheezes or crackles ,diminished air sounds on bases Cardiovascular system: S1 & S2 heard, RRR.  Gastrointestinal system: Abdomen is nondistended, soft and nontender. Central nervous system: Alert and oriented Extremities: No edema, no clubbing ,no  cyanosis Skin: No rashes, no ulcers,no icterus     Data Reviewed: I have personally reviewed following labs and imaging studies  CBC: Recent Labs  Lab 01/20/22 2352 01/21/22 0404 01/21/22 0738  WBC 9.9  --  7.9  NEUTROABS 7.9*  --  6.2  HGB 12.5* 11.2* 11.2*  HCT 37.5* 33.0* 35.0*  MCV 88.9  --  90.2  PLT 188  --  992   Basic Metabolic Panel: Recent Labs  Lab 01/20/22 2352 01/21/22 0404 01/21/22 0738 01/22/22 0758  NA 133* 138 139 144  K 3.8 3.2* 3.2* 2.9*  CL 103  --  106 106  CO2 25  --  26 30  GLUCOSE 115*  --  94 148*  BUN 17  --  17 17  CREATININE 1.72*  --  1.80* 1.77*  CALCIUM 8.9  --  8.5* 9.0  MG  --   --  2.1  --      Recent Results (from the past 240 hour(s))  Resp Panel by RT-PCR (Flu A&B, Covid) Nasopharyngeal Swab     Status: None   Collection Time: 01/21/22 12:24 AM   Specimen: Nasopharyngeal Swab; Nasopharyngeal(NP) swabs in vial transport medium  Result Value Ref Range Status   SARS Coronavirus 2 by RT PCR NEGATIVE NEGATIVE Final    Comment: (NOTE) SARS-CoV-2 target nucleic acids are NOT DETECTED.  The SARS-CoV-2 RNA is generally detectable in upper respiratory specimens during the acute phase of infection. The lowest concentration of SARS-CoV-2 viral copies this assay can detect is 138 copies/mL. A negative result does not preclude SARS-Cov-2 infection and should not be used as the sole basis for treatment or other patient management decisions. A negative result may occur with  improper specimen collection/handling, submission of specimen other than nasopharyngeal swab, presence of viral mutation(s) within the areas targeted by this assay, and inadequate number of viral copies(<138 copies/mL). A negative result must be combined with clinical observations, patient history, and epidemiological information. The expected result is Negative.  Fact Sheet for Patients:  EntrepreneurPulse.com.au  Fact Sheet for Healthcare  Providers:  IncredibleEmployment.be  This test is no t yet approved or cleared by the [place] FDA and  has been authorized for detection and/or diagnosis of SARS-CoV-2 by FDA under an Emergency Use Authorization (EUA). This EUA will remain  in effect (meaning this test can be used) for the duration of the COVID-19 declaration under Section 564(b)(1) of the Act, 21 U.S.C.section 360bbb-3(b)(1), unless the authorization is terminated  or revoked sooner.       Influenza A by PCR NEGATIVE NEGATIVE Final   Influenza B by PCR NEGATIVE NEGATIVE Final    Comment: (NOTE) The Xpert Xpress SARS-CoV-2/FLU/RSV plus assay is intended as an aid in the diagnosis of influenza from Nasopharyngeal swab specimens and should not be used as a sole basis for treatment. Nasal washings and aspirates are unacceptable for Xpert Xpress SARS-CoV-2/FLU/RSV testing.  Fact Sheet for Patients: EntrepreneurPulse.com.au  Fact Sheet for Healthcare Providers: IncredibleEmployment.be  This test is not yet approved or cleared by  the [employer] and has been authorized for detection and/or diagnosis of SARS-CoV-2 by FDA under an Emergency Use Authorization (EUA). This EUA will remain in effect (meaning this test can be used) for the duration of the COVID-19 declaration under Section 564(b)(1) of the Act, 21 U.S.C. section 360bbb-3(b)(1), unless the authorization is terminated or revoked.  Performed at [facility], [place] [address], [place], [place] [address]      Radiology Studies: DG Chest Port 1 View  Result Date: 01/21/2022 CLINICAL DATA:  Shortness of breath EXAM: PORTABLE CHEST 1 VIEW COMPARISON:  CT chest dated 01/15/2022 FINDINGS: Mild scarring in the right mid lung. Mild scarring/atelectasis in the left lower lobe. Mild nodular opacity in the left mid lung. These are all better evaluated on recent CT. No focal consolidation or frank  interstitial edema. No pleural effusion or pneumothorax. Cardiomegaly. Mild thoracic atherosclerosis. IMPRESSION: No evidence of acute cardiopulmonary disease. Stable nodular opacity in the left mid lung, better evaluated on recent CT. Scattered scarring/atelectasis. Electronically Signed   By: Julian Hy M.D.   On: 01/21/2022 00:19    Scheduled Meds:  aspirin EC  81 mg Oral Daily   atorvastatin  20 mg Oral Daily   diltiazem  360 mg Oral Daily   enoxaparin (LOVENOX) injection  40 mg Subcutaneous Daily   furosemide  60 mg Intravenous Q12H   gabapentin  300 mg Oral QHS   hydrALAZINE  100 mg Oral Q8H   isosorbide mononitrate  120 mg Oral Daily   metoprolol  200 mg Oral Daily   pantoprazole  40 mg Oral Daily   potassium chloride  40 mEq Oral Q1 Hr x 2   predniSONE  1 mg Oral Q breakfast   sacubitril-valsartan  1 tablet Oral BID   tamsulosin  0.4 mg Oral QPC supper   Continuous Infusions:   LOS: 1 day   Shelly Coss, MD [facility] P5/19/2023, 11:40 AM

## 2022-01-22 NOTE — Progress Notes (Signed)
Mobility Specialist Progress Note   01/22/22 1853  Mobility  Activity Ambulated with assistance in room  Level of Assistance Standby assist, set-up cues, supervision of patient - no hands on  Assistive Device None  Distance Ambulated (ft) 206 ft  Activity Response Tolerated well  $Mobility charge 1 Mobility   Pre Mobility: 94% - 95% SpO2 on RA During Mobility: 89% - 90% SpO2 on RA Post Mobility: 94% SpO2 on RA  Received pt on 3LO2 in bed having no complaints and agreeable. Pt sitting at 100% SpO2 while in room. Weaned pt down to RA while ambulating out in the hall and pt did not desat pass 89% SpO2. Returned back to bed w/o fault, call bell in reach and all needs met. Notified Retail banker to relay to [employer].   Holland Falling Mobility Specialist Phone Number [phone]

## 2022-01-22 NOTE — Plan of Care (Signed)

## 2022-01-23 ENCOUNTER — Other Ambulatory Visit (HOSPITAL_COMMUNITY): Payer: Self-pay

## 2022-01-23 DIAGNOSIS — I5033 Acute on chronic diastolic (congestive) heart failure: Secondary | ICD-10-CM | POA: Diagnosis not present

## 2022-01-23 LAB — BASIC METABOLIC PANEL
Anion gap: 8 (ref 5–15)
BUN: 20 mg/dL (ref 8–23)
CO2: 30 mmol/L (ref 22–32)
Calcium: 8.8 mg/dL — ABNORMAL LOW (ref 8.9–10.3)
Chloride: 107 mmol/L (ref 98–111)
Creatinine, Ser: 1.66 mg/dL — ABNORMAL HIGH (ref 0.61–1.24)
GFR, Estimated: 45 mL/min — ABNORMAL LOW (ref >60)
Glucose, Bld: 120 mg/dL — ABNORMAL HIGH (ref 70–99)
Potassium: 3.1 mmol/L — ABNORMAL LOW (ref 3.5–5.1)
Sodium: 145 mmol/L (ref 135–145)

## 2022-01-23 LAB — MAGNESIUM: Magnesium: 2 mg/dL (ref 1.7–2.4)

## 2022-01-23 MED ORDER — PREDNISONE 1 MG PO TABS
1.0000 mg | ORAL_TABLET | Freq: Every day | ORAL | 0 refills | Status: AC
Start: 2022-01-23 — End: 2022-04-23

## 2022-01-23 MED ORDER — TORSEMIDE 20 MG PO TABS
40.0000 mg | ORAL_TABLET | Freq: Every day | ORAL | 1 refills | Status: DC
  Filled 2022-01-23: qty 60, 30d supply, fill #0
  Filled 2022-02-20: qty 60, 30d supply, fill #1

## 2022-01-23 MED ORDER — POTASSIUM CHLORIDE CRYS ER 20 MEQ PO TBCR
40.0000 meq | EXTENDED_RELEASE_TABLET | Freq: Once | ORAL | Status: AC
  Administered 2022-01-23: 40 meq via ORAL
  Filled 2022-01-23: qty 2

## 2022-01-23 MED ORDER — ISOSORBIDE MONONITRATE ER 120 MG PO TB24
120.0000 mg | ORAL_TABLET | Freq: Every day | ORAL | 1 refills | Status: DC
  Filled 2022-01-23: qty 30, 30d supply, fill #0
  Filled 2022-02-12: qty 30, 30d supply, fill #1

## 2022-01-23 MED ORDER — POTASSIUM CHLORIDE CRYS ER 20 MEQ PO TBCR
20.0000 meq | EXTENDED_RELEASE_TABLET | Freq: Every day | ORAL | 1 refills | Status: DC
  Filled 2022-01-23 – 2022-02-12 (×2): qty 30, 30d supply, fill #0
  Filled 2022-03-27: qty 30, 30d supply, fill #1

## 2022-01-23 MED ORDER — FUROSEMIDE 10 MG/ML IJ SOLN: 60.0000 mg | Freq: Two times a day (BID) | INTRAMUSCULAR | Status: DC

## 2022-01-23 NOTE — Progress Notes (Signed)
Pt had 8 beats of vtach per tele.  Pt is stable and WNLs.

## 2022-01-23 NOTE — Discharge Summary (Signed)
Physician Discharge Summary  [name] MPN:[id number] DOB: [birth year] DOA: 01/20/2022  PCP: [name], MD  Admit date: 01/20/2022 Discharge date: 01/23/2022  Admitted From: Home Disposition:  Home  Discharge Condition:Stable CODE STATUS:FULL Diet recommendation: Heart Healthy  Brief/Interim Summary: Patient is a 66 year old male with history of OSA on CPAP, pulm hypertension, sarcoidosis, COPD, GERD, hypertension, hyperlipidemia, CKD stage IIIb, coronary artery disease, PE, diastolic congestive heart failure who presented from home with complaints of shortness of breath, bilateral lower extremity swelling.  On presentation he was hypoxic and had to put on BiPAP.  Lab work showed BNP of 4500.  Patient was started on IV Lasix.  Blood pressure also noted to be high and was placed on nitroglycerin infusion.  Currently he is hemodynamically stable.  He has been adequately diuresed during this hospitalization.  He is on room air now.  We will change the diuretic to torsemide 40 mg daily.  He is medically stable for discharge home today.  Following problems were addressed during his hospitalization:  Acute on chronic diastolic congestive heart failure: Presented with 2 to 3 days history of dyspnea on exertion, peripheral edema, PND.  BNP severely elevated.  Started on IV Lasix.  Last echo in 03/2021 showed EF of 60 to 00%, grade 1 diastolic dysfunction.   Takes bumex at home.  He has been adequately diuresed.  Diuretics changed to torsemide 40 mg daily on discharge.  We will continue other home medications.  He needs to follow-up with cardiology as an outpatient.   Acute hypoxic respiratory failure: Initially had put on BiPAP now currently weaned to room air.     Hypertensive emergency: Started on nitroglycerin infusion on presentation due to severe hypertension which might have contributed to CHF also.  Monitor blood pressure .continue home medications. Dose of Imdur increased   Coronary  artery disease: Currently no anginal symptoms.  Continue home regimen of antiplatelet therapy, lipid-lowering therapy and AV nodal blocking therapy.  History of nonsustained V. tach   OSA: Continue CPAP at night   History of pulmonary sarcoidosis: On prednisone 1 mg daily.  Follows with Dr. Valeta Harms pulmonology   CKD stage IIIb: Baseline creatinine around 1.5-1.7.  Currently kidney function close to baseline.Follows with Dr Posey Pronto.Check BMP in a week  Hypokalemia: Continue supplementation.  Check BMP in a week   GERD: Continue PPI       Discharge Diagnoses:  Principal Problem:   Acute on chronic diastolic CHF (congestive heart failure) (HCC) Active Problems:   Hypertensive emergency   Coronary artery disease involving native coronary artery of native heart without angina pectoris   OSA (obstructive sleep apnea)   Stage 3b chronic kidney disease   GERD without esophagitis   Acute respiratory failure with hypoxia [facility])    Discharge Instructions  Discharge Instructions     Diet - low sodium heart healthy   Complete by: As directed    Discharge instructions   Complete by: As directed    1)Please take prescribed medications as instructed 2)Monitor your blood pressure at home 3)Follow up with your PCP in a week and do a BMP test to check your kidney function 4)Restrict fluid intake to less than 1.2 L a day, salt intake to less than 2 g a day, monitor weight at home 5)Follow up with your nephrologist, cardiologist and pulmonologist   Increase activity slowly   Complete by: As directed       Allergies as of 01/23/2022       Reactions  Codeine Nausea Only   Hydrochlorothiazide    Unknown   Hydromorphone Other (See Comments)   hallucinations         Medication List     STOP taking these medications    bumetanide 1 MG tablet Commonly known as: BUMEX   cefdinir 300 MG capsule Commonly known as: OMNICEF       TAKE these medications    aspirin 81 MG  tablet Take 1 tablet (81 mg total) by mouth daily.   atorvastatin 20 MG tablet Commonly known as: LIPITOR Take 20 mg by mouth daily.   diltiazem 360 MG 24 hr capsule Commonly known as: TIAZAC TAKE ONE CAPSULE BY MOUTH EVERY DAY   Entresto 97-103 MG Generic drug: sacubitril-valsartan Take 1 tablet by mouth 2 (two) times daily.   Farxiga 10 MG Tabs tablet Generic drug: dapagliflozin propanediol Take 1 tablet (10 mg total) by mouth daily.   fluticasone 50 MCG/ACT nasal spray Commonly known as: FLONASE Place 2 sprays into both nostrils daily as needed for allergies or rhinitis.   gabapentin 300 MG capsule Commonly known as: NEURONTIN Take 1 capsule by mouth at bedtime for pain What changed:  how much to take how to take this when to take this reasons to take this   hydrALAZINE 100 MG tablet Commonly known as: APRESOLINE Take 1 tablet (100 mg total) by mouth 3 (three) times daily.   isosorbide mononitrate 120 MG 24 hr tablet Commonly known as: IMDUR Take 1 tablet (120 mg total) by mouth daily. Start taking on: Jan 24, 2022 What changed:  medication strength how much to take   metoprolol 200 MG 24 hr tablet Commonly known as: TOPROL-XL Take 1 tablet by mouth once daily What changed:  how much to take when to take this   potassium chloride SA 20 MEQ tablet Commonly known as: KLOR-CON M Take 1 tablet  by mouth daily.   predniSONE 1 MG tablet Commonly known as: DELTASONE Take 1 tablet by mouth daily with breakfast.   tamsulosin 0.4 MG Caps capsule Commonly known as: Flomax Take 1 capsule by mouth daily - need appointment for more refills What changed:  how much to take when to take this   Torsemide 40 MG Tabs Take 40 mg by mouth daily.   traZODone 150 MG tablet Commonly known as: DESYREL Take 1 tablet (150 mg total) by mouth at bedtime as needed for sleep.   triamcinolone cream 0.1 % Commonly known as: KENALOG Apply to affected areas twice a day as  needed for itching/inflammation.        Follow-up Information     [name], MD. Schedule an appointment as soon as possible for a visit in 1 week(s).   Specialty: Internal Medicine Contact information: [place] [address] [phone]                Allergies  Allergen Reactions   Codeine Nausea Only   Hydrochlorothiazide     Unknown   Hydromorphone Other (See Comments)    hallucinations     Consultations: None   Procedures/Studies: CT CHEST HIGH RESOLUTION  Result Date: 01/18/2022 CLINICAL DATA:  66 year old male with history of lymphadenopathy. Possible sarcoidosis. Follow-up study. EXAM: CT CHEST WITHOUT CONTRAST TECHNIQUE: Multidetector CT imaging of the chest was performed following the standard protocol without intravenous contrast. High resolution imaging of the lungs, as well as inspiratory and expiratory imaging, was performed. RADIATION DOSE REDUCTION: This exam was performed according to the departmental dose-optimization  program which includes automated exposure control, adjustment of the mA and/or kV according to patient size and/or use of iterative reconstruction technique. COMPARISON:  Chest CT 08/27/2021. FINDINGS: Cardiovascular: Heart size is borderline enlarged. There is no significant pericardial fluid, thickening or pericardial calcification. There is aortic atherosclerosis, as well as atherosclerosis of the great vessels of the mediastinum and the coronary arteries, including calcified atherosclerotic plaque in the left main, left anterior descending, left circumflex and right coronary arteries. Mediastinum/Nodes: Multiple prominent but nonenlarged mediastinal and bilateral hilar lymph nodes are noted. Many densely calcified mediastinal and bilateral hilar lymph nodes are also noted. Esophagus is unremarkable in appearance. No axillary lymphadenopathy. Lungs/Pleura: Scattered areas of nodularity are again noted throughout the lungs  bilaterally, most evident in peribronchovascular and subpleural distributions (i.e., the overall distribution is perilymphatic). This appears largely similar to the prior examination, with exception of an enlarged nodular area in the posterolateral aspect of the left upper lobe abutting the major fissure (axial image 82 of series 9) measuring 1.9 x 1.5 cm. No acute consolidative airspace disease. No pleural effusions. High-resolution images demonstrate no significant regions of traction bronchiectasis or honeycombing. A few scattered areas of mild septal thickening and linear architectural distortion are noted in the lungs bilaterally. Inspiratory and expiratory imaging demonstrates some mild air trapping indicative of mild small airways disease. Upper Abdomen: Status post cholecystectomy. Musculoskeletal: There are no aggressive appearing lytic or blastic lesions noted in the visualized portions of the skeleton. IMPRESSION: 1. Chronic imaging stigmata of sarcoidosis, generally similar to prior studies, as above. The exception is an enlarging nodular area in the posterolateral aspect of the left upper lobe measuring 1.9 x 1.5 cm on today's examination. This is favored to be part of the underlying chronic sarcoidosis, however, the possibility of neoplasm is not entirely excluded. Close attention on follow-up studies is recommended. 2. Mild air trapping indicative of small airways disease. 3. Aortic atherosclerosis, in addition to left main and three-vessel coronary artery disease. Please note that although the presence of coronary artery calcium documents the presence of coronary artery disease, the severity of this disease and any potential stenosis cannot be assessed on this non-gated CT examination. Assessment for potential risk factor modification, dietary therapy or pharmacologic therapy may be warranted, if clinically indicated. Aortic Atherosclerosis (ICD10-I70.0). Electronically Signed   By: Vinnie Langton  M.D.   On: 01/18/2022 10:24   DG Chest Port 1 View  Result Date: 01/21/2022 CLINICAL DATA:  Shortness of breath EXAM: PORTABLE CHEST 1 VIEW COMPARISON:  CT chest dated 01/15/2022 FINDINGS: Mild scarring in the right mid lung. Mild scarring/atelectasis in the left lower lobe. Mild nodular opacity in the left mid lung. These are all better evaluated on recent CT. No focal consolidation or frank interstitial edema. No pleural effusion or pneumothorax. Cardiomegaly. Mild thoracic atherosclerosis. IMPRESSION: No evidence of acute cardiopulmonary disease. Stable nodular opacity in the left mid lung, better evaluated on recent CT. Scattered scarring/atelectasis. Electronically Signed   By: Julian Hy M.D.   On: 01/21/2022 00:19      Subjective: Patient seen and examined at the bedside this morning.  Hemodynamically stable for discharge today.  Discharge Exam: Vitals:   01/23/22 0504 01/23/22 0738  BP: (!) 153/99   Pulse: (!) 58   Resp:    Temp: 98.4 F (36.9 C) 98.5 F (36.9 C)  SpO2: 97%    Vitals:   01/22/22 1958 01/22/22 2050 01/23/22 0504 01/23/22 0738  BP: 134/74  (!) 153/99  Pulse: (!) 58 60 (!) 58   Resp: 16 16    Temp: 98.1 F (36.7 C)  98.4 F (36.9 C) 98.5 F (36.9 C)  TempSrc: Oral  Oral Oral  SpO2: 95% 95% 97%   Weight:      Height:        General: Pt is alert, awake, not in acute distress Cardiovascular: RRR, S1/S2 +, no rubs, no gallops Respiratory: CTA bilaterally, no wheezing, no rhonchi Abdominal: Soft, NT, ND, bowel sounds + Extremities: no edema, no cyanosis    The results of significant diagnostics from this hospitalization (including imaging, microbiology, ancillary and laboratory) are listed below for reference.     Microbiology: Recent Results (from the past 240 hour(s))  Resp Panel by RT-PCR (Flu A&B, Covid) Nasopharyngeal Swab     Status: None   Collection Time: 01/21/22 12:24 AM   Specimen: Nasopharyngeal Swab; Nasopharyngeal(NP) swabs in  vial transport medium  Result Value Ref Range Status   SARS Coronavirus 2 by RT PCR NEGATIVE NEGATIVE Final    Comment: (NOTE) SARS-CoV-2 target nucleic acids are NOT DETECTED.  The SARS-CoV-2 RNA is generally detectable in upper respiratory specimens during the acute phase of infection. The lowest concentration of SARS-CoV-2 viral copies this assay can detect is 138 copies/mL. A negative result does not preclude SARS-Cov-2 infection and should not be used as the sole basis for treatment or other patient management decisions. A negative result may occur with  improper specimen collection/handling, submission of specimen other than nasopharyngeal swab, presence of viral mutation(s) within the areas targeted by this assay, and inadequate number of viral copies(<138 copies/mL). A negative result must be combined with clinical observations, patient history, and epidemiological information. The expected result is Negative.  Fact Sheet for Patients:  EntrepreneurPulse.com.au  Fact Sheet for Healthcare Providers:  IncredibleEmployment.be  This test is no t yet approved or cleared by the [place] FDA and  has been authorized for detection and/or diagnosis of SARS-CoV-2 by FDA under an Emergency Use Authorization (EUA). This EUA will remain  in effect (meaning this test can be used) for the duration of the COVID-19 declaration under Section 564(b)(1) of the Act, 21 U.S.C.section 360bbb-3(b)(1), unless the authorization is terminated  or revoked sooner.       Influenza A by PCR NEGATIVE NEGATIVE Final   Influenza B by PCR NEGATIVE NEGATIVE Final    Comment: (NOTE) The Xpert Xpress SARS-CoV-2/FLU/RSV plus assay is intended as an aid in the diagnosis of influenza from Nasopharyngeal swab specimens and should not be used as a sole basis for treatment. Nasal washings and aspirates are unacceptable for Xpert Xpress SARS-CoV-2/FLU/RSV testing.  Fact  Sheet for Patients: EntrepreneurPulse.com.au  Fact Sheet for Healthcare Providers: IncredibleEmployment.be  This test is not yet approved or cleared by the [place] FDA and has been authorized for detection and/or diagnosis of SARS-CoV-2 by FDA under an Emergency Use Authorization (EUA). This EUA will remain in effect (meaning this test can be used) for the duration of the COVID-19 declaration under Section 564(b)(1) of the Act, 21 U.S.C. section 360bbb-3(b)(1), unless the authorization is terminated or revoked.  Performed at [facility], [place] [address], [place], [place] [address]      Labs: BNP (last 3 results) Recent Labs    04/22/21 0149 04/27/21 0715 01/20/22 2352  BNP 1,205.3* 1,102.7* >0,102.7*   Basic Metabolic Panel: Recent Labs  Lab 01/20/22 2352 01/21/22 0404 01/21/22 0738 01/22/22 0758 01/23/22 0157  NA 133* 138 139  144 145  K 3.8 3.2* 3.2* 2.9* 3.1*  CL 103  --  106 106 107  CO2 25  --  '26 30 30  '$ GLUCOSE 115*  --  94 148* 120*  BUN 17  --  '17 17 20  '$ CREATININE 1.72*  --  1.80* 1.77* 1.66*  CALCIUM 8.9  --  8.5* 9.0 8.8*  MG  --   --  2.1  --   --    Liver Function Tests: Recent Labs  Lab 01/20/22 2352 01/21/22 0738  AST 49* 37  ALT 59* 50*  ALKPHOS 80 71  BILITOT 0.9 1.1  PROT 6.5 6.1*  ALBUMIN 3.7 3.4*   No results for input(s): LIPASE, AMYLASE in the last 168 hours. No results for input(s): AMMONIA in the last 168 hours. CBC: Recent Labs  Lab 01/20/22 2352 01/21/22 0404 01/21/22 0738  WBC 9.9  --  7.9  NEUTROABS 7.9*  --  6.2  HGB 12.5* 11.2* 11.2*  HCT 37.5* 33.0* 35.0*  MCV 88.9  --  90.2  PLT 188  --  174   Cardiac Enzymes: No results for input(s): CKTOTAL, CKMB, CKMBINDEX, TROPONINI in the last 168 hours. BNP: Invalid input(s): POCBNP CBG: No results for input(s): GLUCAP in the last 168 hours. D-Dimer No results for input(s): DDIMER in the last 72 hours. Hgb A1c No  results for input(s): HGBA1C in the last 72 hours. Lipid Profile No results for input(s): CHOL, HDL, LDLCALC, TRIG, CHOLHDL, LDLDIRECT in the last 72 hours. Thyroid function studies No results for input(s): TSH, T4TOTAL, T3FREE, THYROIDAB in the last 72 hours.  Invalid input(s): FREET3 Anemia work up No results for input(s): VITAMINB12, FOLATE, FERRITIN, TIBC, IRON, RETICCTPCT in the last 72 hours. Urinalysis    Component Value Date/Time   COLORURINE YELLOW 04/01/2021 2255   APPEARANCEUR CLEAR 04/01/2021 2255   LABSPEC 1.013 04/01/2021 2255   PHURINE 6.0 04/01/2021 2255   GLUCOSEU 150 (A) 04/01/2021 2255   HGBUR SMALL (A) 04/01/2021 2255   BILIRUBINUR NEGATIVE 04/01/2021 2255   KETONESUR NEGATIVE 04/01/2021 2255   PROTEINUR >=300 (A) 04/01/2021 2255   UROBILINOGEN 0.2 11/16/2012 1004   NITRITE NEGATIVE 04/01/2021 2255   LEUKOCYTESUR NEGATIVE 04/01/2021 2255   Sepsis Labs Invalid input(s): PROCALCITONIN,  WBC,  LACTICIDVEN Microbiology Recent Results (from the past 240 hour(s))  Resp Panel by RT-PCR (Flu A&B, Covid) Nasopharyngeal Swab     Status: None   Collection Time: 01/21/22 12:24 AM   Specimen: Nasopharyngeal Swab; Nasopharyngeal(NP) swabs in vial transport medium  Result Value Ref Range Status   SARS Coronavirus 2 by RT PCR NEGATIVE NEGATIVE Final    Comment: (NOTE) SARS-CoV-2 target nucleic acids are NOT DETECTED.  The SARS-CoV-2 RNA is generally detectable in upper respiratory specimens during the acute phase of infection. The lowest concentration of SARS-CoV-2 viral copies this assay can detect is 138 copies/mL. A negative result does not preclude SARS-Cov-2 infection and should not be used as the sole basis for treatment or other patient management decisions. A negative result may occur with  improper specimen collection/handling, submission of specimen other than nasopharyngeal swab, presence of viral mutation(s) within the areas targeted by this assay, and  inadequate number of viral copies(<138 copies/mL). A negative result must be combined with clinical observations, patient history, and epidemiological information. The expected result is Negative.  Fact Sheet for Patients:  EntrepreneurPulse.com.au  Fact Sheet for Healthcare Providers:  IncredibleEmployment.be  This test is no t yet approved or cleared by the [place]  States FDA and  has been authorized for detection and/or diagnosis of SARS-CoV-2 by FDA under an Emergency Use Authorization (EUA). This EUA will remain  in effect (meaning this test can be used) for the duration of the COVID-19 declaration under Section 564(b)(1) of the Act, 21 U.S.C.section 360bbb-3(b)(1), unless the authorization is terminated  or revoked sooner.       Influenza A by PCR NEGATIVE NEGATIVE Final   Influenza B by PCR NEGATIVE NEGATIVE Final    Comment: (NOTE) The Xpert Xpress SARS-CoV-2/FLU/RSV plus assay is intended as an aid in the diagnosis of influenza from Nasopharyngeal swab specimens and should not be used as a sole basis for treatment. Nasal washings and aspirates are unacceptable for Xpert Xpress SARS-CoV-2/FLU/RSV testing.  Fact Sheet for Patients: EntrepreneurPulse.com.au  Fact Sheet for Healthcare Providers: IncredibleEmployment.be  This test is not yet approved or cleared by the [place] FDA and has been authorized for detection and/or diagnosis of SARS-CoV-2 by FDA under an Emergency Use Authorization (EUA). This EUA will remain in effect (meaning this test can be used) for the duration of the COVID-19 declaration under Section 564(b)(1) of the Act, 21 U.S.C. section 360bbb-3(b)(1), unless the authorization is terminated or revoked.  Performed at [facility], [place] [address], [place], [place] [address]     Please note: You were cared for by a hospitalist during your hospital stay. Once you are  discharged, your primary care physician will handle any further medical issues. Please note that NO REFILLS for any discharge medications will be authorized once you are discharged, as it is imperative that you return to your primary care physician (or establish a relationship with a primary care physician if you do not have one) for your post hospital discharge needs so that they can reassess your need for medications and monitor your lab values.    Time coordinating discharge: 40 minutes  SIGNED:   Shelly Coss, MD  [facility] 01/23/2022, 10:26 AM Pager [phone]  If 7PM-7AM, please contact night-coverage [email or web address] Password [email or web address]

## 2022-01-23 NOTE — Progress Notes (Signed)
Mobility Specialist Progress Note   01/23/22 1159  Mobility  Activity Ambulated independently in hallway  Level of Assistance Independent after set-up  Assistive Device None  Distance Ambulated (ft) 362 ft  Activity Response Tolerated well  $Mobility charge 1 Mobility   Pre Mobility: 74 HR, 94% SpO2 on Ra During Mobility: 79 HR, 89% - 90% SpO2 on RA Post Mobility: 64 HR, 98% SpO2 on RA  Received pt sitting EOB having no complaints and agreeable to mobility. Ambulated w/o fault but RR slightly increased to 45, pt showing no visible signs of exertion and SpO2 remained >89%. Returned back to EOB w/ no complaints, call bell in reach and all needs met.  Holland Falling Mobility Specialist Phone Number [phone]

## 2022-01-25 ENCOUNTER — Other Ambulatory Visit (HOSPITAL_COMMUNITY): Payer: Self-pay

## 2022-01-25 ENCOUNTER — Ambulatory Visit: Payer: PPO | Admitting: Pulmonary Disease

## 2022-01-25 ENCOUNTER — Encounter: Payer: Self-pay | Admitting: Pulmonary Disease

## 2022-01-25 VITALS — BP 126/84 | HR 58 | Temp 97.9°F | Ht 72.0 in | Wt 218.2 lb

## 2022-01-25 DIAGNOSIS — I5032 Chronic diastolic (congestive) heart failure: Secondary | ICD-10-CM

## 2022-01-25 DIAGNOSIS — R9389 Abnormal findings on diagnostic imaging of other specified body structures: Secondary | ICD-10-CM

## 2022-01-25 DIAGNOSIS — D869 Sarcoidosis, unspecified: Secondary | ICD-10-CM | POA: Diagnosis not present

## 2022-01-25 DIAGNOSIS — R911 Solitary pulmonary nodule: Secondary | ICD-10-CM

## 2022-01-25 DIAGNOSIS — H401121 Primary open-angle glaucoma, left eye, mild stage: Secondary | ICD-10-CM | POA: Diagnosis not present

## 2022-01-25 MED ORDER — PREDNISOLONE ACETATE 1 % OP SUSP
OPHTHALMIC | 0 refills | Status: DC
  Filled 2022-01-25: qty 5, 4d supply, fill #0

## 2022-01-25 MED ORDER — TRAZODONE HCL 150 MG PO TABS
ORAL_TABLET | ORAL | 3 refills | Status: DC
  Filled 2022-01-25: qty 90, 90d supply, fill #0
  Filled 2022-04-03: qty 90, 90d supply, fill #1

## 2022-01-25 NOTE — Progress Notes (Signed)
Synopsis: Referred in September 2022 for abnormal CT by [name], MD  Subjective:   PATIENT ID: [name] GENDER: male DOB: 03-02-1956, MRN: [id number]  Chief Complaint  Patient presents with   Follow-up    Follow up.     This is a 66 year old gentleman, history of CVA, hypertension, hyperlipidemia, smoker.  Has had a recent cardiac catheterization which showed mild pulmonary hypertension on his right heart cath and coronary artery disease on his left.  This was completed yesterday.  He had a Hospitalization in August.  Has a history of sarcoidosis and COPD.  Due to shortness of breath he had a CT scan of the chest which revealed mediastinal adenopathy as well as a 3.7 x 2.4 cm anterior right lower lobe peripheral based lesion.  Patient was referred today for evaluation of abnormal CT imaging.  From respiratory standpoint he is doing well with no complaints today.  OV 09/25/2021: Here today for follow-up.Patient was last seen in the office by Patricia Nettle, NP PET scan in September showed resolution of abnormality seen in the right lower lobe.  Patient had a history of sarcoidosis documented based on imaging.  No previous lung biopsy.  He initially saw me after a hospitalization.  We thought it would be best to obtain the PET before consideration for biopsy.  Patient had a repeat CT scan of the chest and December 2022.  He has areas of tree-in-bud opacities within the lung seen on this CT scan.  He also has calcified nodes within the mediastinum.  Patient still taking 2.5 mg prednisone daily.  Has been on this for many years.  OV 10/23/2021: Here today for follow-up regarding sarcoidosis.  Overall doing well with no significant issues at this time.  Has been on prednisone for a long time.  Last time we talked about decreasing his prednisone dosing.  He also has other scattered areas of tree-in-bud nodularity.  Additionally has calcified nodal disease.  OV 01/25/2022: here for follow up of  sarcoidosis .  Patient has been on prednisone treatments for 25+ years.  No previous lung biopsy.  Has had some waxing and waning nodules.  Was initially seen after hospitalization that had a right lower lobe nodule that disappeared.  He has a repeat HRCT scan completed last week we reviewed this today that shows a new peripheral left-sided lung nodule.  It has grown in size since his CT 5 months ago.  He has no other respiratory symptoms has been on 1 mg of prednisone daily.  He seems to be fine from a symptom standpoint no shortness of breath cough sputum production no hemoptysis.   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   ED (erectile dysfunction)    GERD (gastroesophageal reflux disease)    Hypercalcemia    Hyperlipidemia    Hypertension    Insomnia    Long-term use of high-risk medication    Nephrocalcinosis    Nephrolithiasis    Obesity    Osteopenia    Pancreatitis    admitted 03/19/01-06/05/01   PE (pulmonary embolism) 01/30/2013   Proteinuria    Sarcoidosis    Sleep apnea    Smoker    Stroke ([place])  Family History  Problem Relation Age of Onset   Hypertension Father    CVA Father    Lung cancer Father    Alzheimer's disease Mother    Hypertension Sister    Heart failure Sister      Past Surgical History:  Procedure Laterality Date   ABDOMINAL EXPLORATION SURGERY     BACK SURGERY     CHOLECYSTECTOMY     RIGHT/LEFT HEART CATH AND CORONARY ANGIOGRAPHY N/A 05/12/2021   Procedure: RIGHT/LEFT HEART CATH AND CORONARY ANGIOGRAPHY;  Surgeon: [name], MD;  Location: [facility];  Service: Cardiovascular;  Laterality: N/A;   SHOULDER ARTHROSCOPY Right 10/30/2020   Procedure: ARTHROSCOPY SHOULDER WITH EXTENSIVE  DEBRIDEMENT;  Surgeon: Mcarthur Rossetti, MD;  Location: [facility];  Service: Orthopedics;  Laterality: Right;   SHOULDER SURGERY     TRACHEOSTOMY     closed    Social History   Socioeconomic History   Marital status: Married    Spouse name: Building services engineer   Number of children: 2   Years of education: Not on file   Highest education level: Associate degree: academic program  Occupational History   Not on file  Tobacco Use   Smoking status: Former    Packs/day: 0.25    Years: 15.00    Pack years: 3.75    Types: Cigarettes    Start date: 09/06/2005    Quit date: 02/23/2021    Years since quitting: 0.9   Smokeless tobacco: Never  Vaping Use   Vaping Use: Never used  Substance and Sexual Activity   Alcohol use: No    Alcohol/week: 0.0 standard drinks   Drug use: No   Sexual activity: Yes  Other Topics Concern   Not on file  Social History Narrative   Not on file   Social Determinants of Health   Financial Resource Strain: Low Risk    Difficulty of Paying Living Expenses: Not very hard  Food Insecurity: Not on file  Transportation Needs: No Transportation Needs   Lack of Transportation (Medical): No   Lack of Transportation (Non-Medical): No  Physical Activity: Not on file  Stress: Not on file  Social Connections: Not on file  Intimate Partner Violence: Not on file     Allergies  Allergen Reactions   Codeine Nausea Only   Hydrochlorothiazide     Unknown   Hydromorphone Other (See Comments)    hallucinations      Outpatient Medications Prior to Visit  Medication Sig Dispense Refill   aspirin 81 MG tablet Take 1 tablet (81 mg total) by mouth daily.     atorvastatin (LIPITOR) 20 MG tablet Take 20 mg by mouth daily.     dapagliflozin propanediol (FARXIGA) 10 MG TABS tablet Take 1 tablet (10 mg total) by mouth daily. 90 tablet 3   diltiazem (TIAZAC) 360 MG 24 hr capsule TAKE ONE CAPSULE BY MOUTH EVERY DAY 90 capsule 3   fluticasone (FLONASE) 50  MCG/ACT nasal spray Place 2 sprays into both nostrils daily as needed for allergies or rhinitis.     gabapentin (NEURONTIN) 300 MG capsule Take 1 capsule by mouth at bedtime for pain (Patient taking differently: Take 300 mg by mouth at bedtime as needed (pain).) 90 capsule 0   hydrALAZINE (APRESOLINE) 100 MG tablet Take 1 tablet (100 mg total) by mouth 3 (three) times daily. 90 tablet 3   isosorbide mononitrate (IMDUR) 120 MG 24 hr tablet Take 1 tablet (120 mg total) by  mouth daily. 30 tablet 1   metoprolol (TOPROL-XL) 200 MG 24 hr tablet Take 1 tablet by mouth once daily (Patient taking differently: Take 200 mg by mouth daily.) 90 tablet 3   potassium chloride SA (KLOR-CON M) 20 MEQ tablet Take 1 tablet  by mouth daily. 30 tablet 1   prednisoLONE acetate (PRED FORTE) 1 % ophthalmic suspension Instill 1 drop into left eye 4 times a day for 4 days then save bottle for right eye 5 mL 0   predniSONE (DELTASONE) 1 MG tablet Take 1 tablet by mouth daily with breakfast. 90 tablet 0   sacubitril-valsartan (ENTRESTO) 97-103 MG Take 1 tablet by mouth 2 (two) times daily. 60 tablet 3   tamsulosin (FLOMAX) 0.4 MG CAPS capsule Take 1 capsule by mouth daily - need appointment for more refills (Patient taking differently: Take 0.4 mg by mouth daily.) 30 capsule 6   torsemide (DEMADEX) 20 MG tablet Take 2 tablets (40 mg total) by mouth daily. 60 tablet 1   traZODone (DESYREL) 150 MG tablet Take 1 tablet (150 mg total) by mouth at bedtime as needed for sleep. 90 tablet 3   triamcinolone cream (KENALOG) 0.1 % Apply to affected areas twice a day as needed for itching/inflammation. 80 g 2   No facility-administered medications prior to visit.    Review of Systems  Constitutional:  Negative for chills, fever, malaise/fatigue and weight loss.  HENT:  Negative for hearing loss, sore throat and tinnitus.   Eyes:  Negative for blurred vision and double vision.  Respiratory:  Negative for cough, hemoptysis, sputum  production, shortness of breath, wheezing and stridor.   Cardiovascular:  Negative for chest pain, palpitations, orthopnea, leg swelling and PND.  Gastrointestinal:  Negative for abdominal pain, constipation, diarrhea, heartburn, nausea and vomiting.  Genitourinary:  Negative for dysuria, hematuria and urgency.  Musculoskeletal:  Negative for joint pain and myalgias.  Skin:  Negative for itching and rash.  Neurological:  Negative for dizziness, tingling, weakness and headaches.  Endo/Heme/Allergies:  Negative for environmental allergies. Does not bruise/bleed easily.  Psychiatric/Behavioral:  Negative for depression. The patient is not nervous/anxious and does not have insomnia.   All other systems reviewed and are negative.   Objective:  Physical Exam Vitals reviewed.  Constitutional:      General: He is not in acute distress.    Appearance: He is well-developed.  HENT:     Head: Normocephalic and atraumatic.  Eyes:     General: No scleral icterus.    Conjunctiva/sclera: Conjunctivae normal.     Pupils: Pupils are equal, round, and reactive to light.  Neck:     Vascular: No JVD.     Trachea: No tracheal deviation.  Cardiovascular:     Rate and Rhythm: Normal rate and regular rhythm.     Heart sounds: Normal heart sounds. No murmur heard. Pulmonary:     Effort: Pulmonary effort is normal. No tachypnea, accessory muscle usage or respiratory distress.     Breath sounds: No stridor. No wheezing, rhonchi or rales.  Abdominal:     Palpations: Abdomen is soft.     Tenderness: There is no abdominal tenderness.  Musculoskeletal:        General: No tenderness.     Cervical back: Neck supple.  Lymphadenopathy:     Cervical: No cervical adenopathy.  Skin:    General: Skin is warm and dry.     Capillary Refill: Capillary refill takes less than 2 seconds.     Findings:  No rash.  Neurological:     Mental Status: He is alert and oriented to person, place, and time.  Psychiatric:         Behavior: Behavior normal.     Vitals:   01/25/22 0851  BP: 126/84  Pulse: (!) 58  Temp: 97.9 F (36.6 C)  TempSrc: Oral  SpO2: 93%  Weight: 218 lb 3.2 oz (99 kg)  Height: 6' (1.829 m)   93% on RA BMI Readings from Last 3 Encounters:  01/25/22 29.59 kg/m  01/20/22 30.96 kg/m  11/23/21 30.96 kg/m   Wt Readings from Last 3 Encounters:  01/25/22 218 lb 3.2 oz (99 kg)  01/20/22 222 lb 0.1 oz (100.7 kg)  11/23/21 222 lb (100.7 kg)     CBC    Component Value Date/Time   WBC 7.9 01/21/2022 0738   RBC 3.88 (L) 01/21/2022 0738   HGB 11.2 (L) 01/21/2022 0738   HCT 35.0 (L) 01/21/2022 0738   PLT 174 01/21/2022 0738   MCV 90.2 01/21/2022 0738   MCV 89.4 11/02/2015 1003   MCH 28.9 01/21/2022 0738   MCHC 32.0 01/21/2022 0738   RDW 15.7 (H) 01/21/2022 0738   LYMPHSABS 0.6 (L) 01/21/2022 0738   MONOABS 1.0 01/21/2022 0738   EOSABS 0.1 01/21/2022 0738   BASOSABS 0.0 01/21/2022 0738    Chest Imaging: 04/21/2021: CT scan of the chest. 3.7 x 2.4 cm right lower lobe peripheral based lesion, areas of patchy nodularity and peribronchovascular thickening with his history of sarcoid. The patient's images have been independently reviewed by me.    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  Calcified lymph nodes within the mediastinum. The patient's images have been independently reviewed by me.    HRCT May 2023: Scattered areas of nodularity, calcified lymph nodes within the mediastinum. New left upper lobe peripheral lung nodule. The patient's images have been independently reviewed by me.    Pulmonary Functions Testing Results:     View : No data to display.          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 If he has angina symptoms in future, could consider PCI to distal RCA.    Assessment & Plan:     ICD-10-CM   1. Lung nodule  R91.1 CT CHEST HIGH RESOLUTION    2. Sarcoidosis  D86.9     3. Chronic heart failure with preserved ejection fraction (HCC)  I50.32     4. Abnormal CT of the chest  R93.89        Discussion:  This is a 66 year old gentleman with originally a peripheral 3 cm right lower lobe lesion that was presumed inflammatory as it resolved over a short period of time on CT.  Remains on low-dose prednisone.  No previous biopsy  confirmation of sarcoidosis but is imaging that was diagnosed in nearly 20+ years ago was felt to be related to sarcoid and he has been on prolonged chronic immune suppression up until the time we first met.  His last office visit in February we decided to decrease his prednisone to 1 mg a day.  He had repeat CT imaging last week which demonstrates a new peripheral nodule on the left.  He has some other calcified mediastinal nodes and areas of tree-in-bud which may represent chronic MAI.  Plan: I think that the new nodule that has progressed within 5 months is less likely to be malignancy and likely more inflammatory especially due to the patient's previous history of waxing and waning nodules. After discussing this with the patient he has agreed with that we will follow this closely with a repeat CT in 6 months. I did explain that if he started to have any changes in his respiratory status or symptoms such as cough sputum production or fevers the being on chronic prednisone may dispose him to development of additional chronic infections and consideration  for bronchoscopy and cultures may be needed in the future. Patient is agreeable to this plan. Continue 1 mg of prednisone daily.  If he has any progression on his CT imaging we may need to consider alternate immune suppression.  Even though he has been on this for many many years started prior to the time that I met him.  Patient to return to clinic in 6 months after CT chest complete.  Can see me or APP in follow up.    Current Outpatient Medications:    aspirin 81 MG tablet, Take 1 tablet (81 mg total) by mouth daily., Disp: , Rfl:    atorvastatin (LIPITOR) 20 MG tablet, Take 20 mg by mouth daily., Disp: , Rfl:    dapagliflozin propanediol (FARXIGA) 10 MG TABS tablet, Take 1 tablet (10 mg total) by mouth daily., Disp: 90 tablet, Rfl: 3   diltiazem (TIAZAC) 360 MG 24 hr capsule, TAKE ONE CAPSULE BY MOUTH EVERY DAY, Disp: 90 capsule, Rfl: 3   fluticasone (FLONASE) 50 MCG/ACT nasal spray, Place 2 sprays into both nostrils daily as needed for allergies or rhinitis., Disp: , Rfl:    gabapentin (NEURONTIN) 300 MG capsule, Take 1 capsule by mouth at bedtime for pain (Patient taking differently: Take 300 mg by mouth at bedtime as needed (pain).), Disp: 90 capsule, Rfl: 0   hydrALAZINE (APRESOLINE) 100 MG tablet, Take 1 tablet (100 mg total) by mouth 3 (three) times daily., Disp: 90 tablet, Rfl: 3   isosorbide mononitrate (IMDUR) 120 MG 24 hr tablet, Take 1 tablet (120 mg total) by mouth daily., Disp: 30 tablet, Rfl: 1   metoprolol (TOPROL-XL) 200 MG 24 hr tablet, Take 1 tablet by mouth once daily (Patient taking differently: Take 200 mg by mouth daily.), Disp: 90 tablet, Rfl: 3   potassium chloride SA (KLOR-CON M) 20 MEQ tablet, Take 1 tablet  by mouth daily., Disp: 30 tablet, Rfl: 1   prednisoLONE acetate (PRED FORTE) 1 % ophthalmic suspension, Instill 1 drop into left eye 4 times a day for 4 days then save bottle for right eye, Disp: 5 mL, Rfl: 0   predniSONE (DELTASONE) 1 MG tablet, Take 1 tablet by  mouth daily with breakfast., Disp: 90 tablet, Rfl: 0   sacubitril-valsartan (ENTRESTO) 97-103 MG, Take 1 tablet by mouth 2 (two) times daily., Disp: 60 tablet, Rfl: 3   tamsulosin (FLOMAX) 0.4 MG CAPS  capsule, Take 1 capsule by mouth daily - need appointment for more refills (Patient taking differently: Take 0.4 mg by mouth daily.), Disp: 30 capsule, Rfl: 6   torsemide (DEMADEX) 20 MG tablet, Take 2 tablets (40 mg total) by mouth daily., Disp: 60 tablet, Rfl: 1   traZODone (DESYREL) 150 MG tablet, Take 1 tablet (150 mg total) by mouth at bedtime as needed for sleep., Disp: 90 tablet, Rfl: 3   triamcinolone cream (KENALOG) 0.1 %, Apply to affected areas twice a day as needed for itching/inflammation., Disp: 80 g, Rfl: 2    Garner Nash, DO [facility] 01/25/2022 9:18 AM

## 2022-01-25 NOTE — Patient Instructions (Signed)
Thank you for visiting Dr. Valeta Harms at [facility]. Today we recommend the following:  Orders Placed This Encounter  Procedures   CT CHEST HIGH RESOLUTION    Return in about 6 months (around 07/28/2022) for with Eric Form, NP, or Dr. Valeta Harms.    Please do your part to reduce the spread of COVID-19.

## 2022-01-26 DIAGNOSIS — I509 Heart failure, unspecified: Secondary | ICD-10-CM | POA: Diagnosis not present

## 2022-01-28 ENCOUNTER — Other Ambulatory Visit (HOSPITAL_COMMUNITY): Payer: Self-pay

## 2022-01-28 DIAGNOSIS — I5032 Chronic diastolic (congestive) heart failure: Secondary | ICD-10-CM | POA: Diagnosis not present

## 2022-01-28 DIAGNOSIS — J9601 Acute respiratory failure with hypoxia: Secondary | ICD-10-CM | POA: Diagnosis not present

## 2022-01-28 DIAGNOSIS — I2510 Atherosclerotic heart disease of native coronary artery without angina pectoris: Secondary | ICD-10-CM | POA: Diagnosis not present

## 2022-01-28 DIAGNOSIS — I129 Hypertensive chronic kidney disease with stage 1 through stage 4 chronic kidney disease, or unspecified chronic kidney disease: Secondary | ICD-10-CM | POA: Diagnosis not present

## 2022-01-28 DIAGNOSIS — N1831 Chronic kidney disease, stage 3a: Secondary | ICD-10-CM | POA: Diagnosis not present

## 2022-01-28 DIAGNOSIS — D869 Sarcoidosis, unspecified: Secondary | ICD-10-CM | POA: Diagnosis not present

## 2022-01-28 DIAGNOSIS — Z1152 Encounter for screening for COVID-19: Secondary | ICD-10-CM | POA: Diagnosis not present

## 2022-01-28 DIAGNOSIS — R911 Solitary pulmonary nodule: Secondary | ICD-10-CM | POA: Diagnosis not present

## 2022-01-28 DIAGNOSIS — E876 Hypokalemia: Secondary | ICD-10-CM | POA: Diagnosis not present

## 2022-01-28 DIAGNOSIS — I161 Hypertensive emergency: Secondary | ICD-10-CM | POA: Diagnosis not present

## 2022-01-28 DIAGNOSIS — J069 Acute upper respiratory infection, unspecified: Secondary | ICD-10-CM | POA: Diagnosis not present

## 2022-01-28 DIAGNOSIS — I509 Heart failure, unspecified: Secondary | ICD-10-CM | POA: Diagnosis not present

## 2022-01-28 MED ORDER — AMOXICILLIN-POT CLAVULANATE 875-125 MG PO TABS
ORAL_TABLET | ORAL | 0 refills | Status: DC
  Filled 2022-01-28: qty 14, 7d supply, fill #0

## 2022-01-29 DIAGNOSIS — I110 Hypertensive heart disease with heart failure: Secondary | ICD-10-CM | POA: Diagnosis not present

## 2022-01-29 DIAGNOSIS — Z7984 Long term (current) use of oral hypoglycemic drugs: Secondary | ICD-10-CM | POA: Diagnosis not present

## 2022-01-29 DIAGNOSIS — I5030 Unspecified diastolic (congestive) heart failure: Secondary | ICD-10-CM | POA: Diagnosis not present

## 2022-01-30 ENCOUNTER — Other Ambulatory Visit (HOSPITAL_COMMUNITY): Payer: Self-pay

## 2022-02-02 ENCOUNTER — Other Ambulatory Visit (HOSPITAL_COMMUNITY): Payer: Self-pay

## 2022-02-02 MED ORDER — TAMSULOSIN HCL 0.4 MG PO CAPS
ORAL_CAPSULE | ORAL | 6 refills | Status: DC
  Filled 2022-02-02: qty 30, 30d supply, fill #0
  Filled 2022-02-27: qty 30, 30d supply, fill #1
  Filled 2022-04-03: qty 30, 30d supply, fill #2
  Filled 2022-05-11: qty 30, 30d supply, fill #3
  Filled 2022-06-19: qty 30, 30d supply, fill #4
  Filled 2022-08-22: qty 30, 30d supply, fill #5
  Filled 2022-09-19: qty 30, 30d supply, fill #6

## 2022-02-03 DIAGNOSIS — G4733 Obstructive sleep apnea (adult) (pediatric): Secondary | ICD-10-CM | POA: Diagnosis not present

## 2022-02-05 ENCOUNTER — Ambulatory Visit: Payer: PPO | Admitting: Cardiology

## 2022-02-05 ENCOUNTER — Encounter: Payer: Self-pay | Admitting: Cardiology

## 2022-02-05 ENCOUNTER — Other Ambulatory Visit (HOSPITAL_COMMUNITY): Payer: Self-pay

## 2022-02-05 VITALS — BP 126/79 | HR 60 | Temp 98.5°F | Resp 16 | Ht 72.0 in | Wt 208.4 lb

## 2022-02-05 DIAGNOSIS — I4729 Other ventricular tachycardia: Secondary | ICD-10-CM

## 2022-02-05 DIAGNOSIS — F17210 Nicotine dependence, cigarettes, uncomplicated: Secondary | ICD-10-CM | POA: Diagnosis not present

## 2022-02-05 DIAGNOSIS — E782 Mixed hyperlipidemia: Secondary | ICD-10-CM

## 2022-02-05 DIAGNOSIS — I5032 Chronic diastolic (congestive) heart failure: Secondary | ICD-10-CM | POA: Diagnosis not present

## 2022-02-05 DIAGNOSIS — G4733 Obstructive sleep apnea (adult) (pediatric): Secondary | ICD-10-CM | POA: Diagnosis not present

## 2022-02-05 DIAGNOSIS — Z86711 Personal history of pulmonary embolism: Secondary | ICD-10-CM | POA: Diagnosis not present

## 2022-02-05 DIAGNOSIS — I10 Essential (primary) hypertension: Secondary | ICD-10-CM

## 2022-02-05 DIAGNOSIS — D860 Sarcoidosis of lung: Secondary | ICD-10-CM | POA: Diagnosis not present

## 2022-02-05 MED ORDER — NICOTINE POLACRILEX 4 MG MT LOZG
4.0000 mg | LOZENGE | OROMUCOSAL | 0 refills | Status: DC | PRN
  Filled 2022-02-05: qty 120, fill #0

## 2022-02-05 MED ORDER — NICOTINE 7 MG/24HR TD PT24
7.0000 mg | MEDICATED_PATCH | Freq: Every day | TRANSDERMAL | 0 refills | Status: DC
  Filled 2022-02-05: qty 28, 28d supply, fill #0
  Filled 2022-02-27: qty 28, 28d supply, fill #1

## 2022-02-05 NOTE — Progress Notes (Signed)
ID:  [name], DOB May 13, 1956, MRN [id number]  PCP:  [name], MD  Cardiologist: Rex Kras, DO, [facility] (established care 12/10/2020)  Date: 02/05/22 Last Office Visit: 06/10/2021  Chief Complaint  Patient presents with   Follow-up    Status post hospitalization for heart failure exacerbation    HPI  [name] is a 66 y.o. 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  Referred to the practice for evaluation of heart failure.  In the past patient has had multiple hospitalizations for shortness of breath secondary to his pulmonary conditions as well as HFpEF.  His medications have been uptitrated and was doing well until recently he was hospitalized in May 2023 for acute exacerbation of HFpEF likely due to uncontrolled hypertension.  During his hospitalization he was diuresed appropriately and was discharged home on torsemide as opposed Bumex.   The patient continues to smoke approximately 2 cigarettes/day and is requesting nicotine patches.  FUNCTIONAL STATUS: No structured exercise program or daily routine.   ALLERGIES: Allergies  Allergen Reactions   Codeine Nausea Only   Hydrochlorothiazide     Unknown   Hydromorphone Other (See Comments)    hallucinations     MEDICATION LIST PRIOR TO VISIT: Current Meds  Medication Sig   aspirin 81 MG tablet Take 1 tablet (81 mg total) by mouth daily.   atorvastatin (LIPITOR) 20 MG tablet Take 20 mg by mouth daily.   Cholecalciferol (VITAMIN D) 125 MCG (5000 UT) CAPS Take 1 capsule by mouth daily.   dapagliflozin propanediol (FARXIGA) 10 MG  TABS tablet Take 1 tablet (10 mg total) by mouth daily.   diltiazem (TIAZAC) 360 MG 24 hr capsule TAKE ONE CAPSULE BY MOUTH EVERY DAY   fluticasone (FLONASE) 50 MCG/ACT nasal spray Place 2 sprays into both nostrils daily as needed for allergies or rhinitis.   guaifenesin (HUMIBID E) 400 MG TABS tablet Take 400 mg by mouth every 4 (four) hours.   hydrALAZINE (APRESOLINE) 100 MG tablet Take 1 tablet (100 mg total) by mouth 3 (three) times daily.   isosorbide mononitrate (IMDUR) 120 MG 24 hr tablet Take 1 tablet (120 mg total) by mouth daily.   metoprolol (TOPROL-XL) 200 MG 24 hr tablet Take 1 tablet by mouth once daily (Patient taking differently: Take 200 mg by mouth daily.)   nicotine (NICODERM CQ - DOSED IN MG/24 HR) 7 mg/24hr patch Place 1 patch (7 mg total) onto the skin daily.   nicotine polacrilex (COMMIT) 4 MG lozenge Take 1 lozenge (4 mg total) by mouth as needed for up to 90 doses for smoking cessation.   potassium chloride SA (KLOR-CON M) 20 MEQ tablet Take 1 tablet  by mouth daily.   predniSONE (DELTASONE) 1 MG tablet Take 1 tablet by mouth daily with breakfast.   Probiotic Product (PROBIOTIC DIGESTIVE SUPP) CAPS Take 1 capsule by mouth daily.   sacubitril-valsartan (ENTRESTO) 97-103 MG Take 1 tablet by mouth 2 (two) times daily.   tamsulosin (FLOMAX) 0.4 MG CAPS capsule Take 1 capsule by mouth daily - need appointment for more refills   torsemide (DEMADEX) 20 MG tablet Take 2 tablets (40 mg total) by mouth daily. (Patient  taking differently: Take 20 mg by mouth daily.)   traZODone (DESYREL) 150 MG tablet Take 1 tablet (150 mg total) by mouth at bedtime as needed for sleep.   traZODone (DESYREL) 150 MG tablet TAKE 1 TABLET AT BEDTIME   triamcinolone cream (KENALOG) 0.1 % Apply to affected areas twice a day as needed for itching/inflammation.     PAST MEDICAL HISTORY: Past Medical History:  Diagnosis Date   Back pain    CHF (congestive heart failure) (HCC)    CKD (chronic kidney  disease), stage III (HCC)    CVA (cerebral infarction)    Diastolic dysfunction    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   ED (erectile dysfunction)    GERD (gastroesophageal reflux disease)    Hypercalcemia    Hyperlipidemia    Hypertension    Insomnia    Long-term use of high-risk medication    Nephrocalcinosis    Nephrolithiasis    Obesity    Osteopenia    Pancreatitis    admitted 03/19/01-06/05/01   PE (pulmonary embolism) 01/30/2013   Proteinuria    Sarcoidosis    Sleep apnea    Smoker    Stroke [facility])   Patient and wife denies hx of CVA.   PAST SURGICAL HISTORY: Past Surgical History:  Procedure Laterality Date   ABDOMINAL EXPLORATION SURGERY     BACK SURGERY     CHOLECYSTECTOMY     RIGHT/LEFT HEART CATH AND CORONARY ANGIOGRAPHY N/A 05/12/2021   Procedure: RIGHT/LEFT HEART CATH AND CORONARY ANGIOGRAPHY;  Surgeon: [name], MD;  Location: [place] [facility];  Service: Cardiovascular;  Laterality: N/A;   SHOULDER ARTHROSCOPY Right 10/30/2020   Procedure: ARTHROSCOPY SHOULDER WITH EXTENSIVE DEBRIDEMENT;  Surgeon: Mcarthur Rossetti, MD;  Location: [place];  Service: Orthopedics;  Laterality: Right;   SHOULDER SURGERY     TRACHEOSTOMY     closed    FAMILY HISTORY: The patient family history includes Alzheimer's disease in his mother; CVA in his father; Heart failure in his sister; Hypertension in his father and sister; Lung cancer in his father.  SOCIAL HISTORY:  The patient  reports that he quit smoking about a year ago. His smoking use included cigarettes. He started smoking about 16 years ago. He has a 3.75 pack-year smoking history. He has never used smokeless tobacco. He reports that he does not drink alcohol and does not use drugs.  REVIEW OF SYSTEMS: Review of Systems  Cardiovascular:  Positive for dyspnea on exertion (Chronic and stable). Negative for chest pain, leg swelling, orthopnea,  palpitations, paroxysmal nocturnal dyspnea and syncope.  Respiratory:  Positive for cough (Dry, nonproductive) and shortness of breath (Chronic and stable).    PHYSICAL EXAM:    02/05/2022   11:19 AM 01/25/2022    8:51 AM 01/23/2022    1:32 PM  Vitals with BMI  Height _0  _1    Weight 208 lbs 6 oz 218 lbs 3 oz   BMI 48.54 62.70   Systolic 350 093 818  Diastolic 79 84 86  Pulse 60 58     CONSTITUTIONAL: Appears older than stated age, well-developed and well-nourished. No acute distress.  SKIN: Skin is warm and dry. No rash noted. No cyanosis. No pallor. No jaundice HEAD: Normocephalic and atraumatic.  EYES: No scleral icterus MOUTH/THROAT: Moist oral membranes.  NECK: No JVD present. No thyromegaly noted. No carotid bruits  CHEST Normal respiratory effort. No intercostal retractions  LUNGS: Clear to auscultation bilaterally.  No stridor. No wheezes. No rales.  CARDIOVASCULAR: Regular, positive N3-Z7, soft holosystolic murmur heard at the apex, no gallops or rubs. ABDOMINAL: Obese, soft soft, nontender, distended, positive bowel sounds in all 4 quadrants, no apparent ascites.  EXTREMITIES: no  bilateral pitting edema, no open wounds or ulcers.   HEMATOLOGIC: No significant bruising NEUROLOGIC: Oriented to person, place, and time. Nonfocal. Normal muscle tone.  PSYCHIATRIC: Normal mood and affect. Normal behavior. Cooperative  CARDIAC DATABASE: EKG: 12/18/2020: Sinus bradycardia, 58 bpm, left atrial enlargement, diffuse T wave inversions.  No significant change compared to prior ECG.  Echocardiogram: 03/23/2021: LVEF 60-65%, mild LVH, grade 1 diastolic impairment, RVSP 49 mmHg consistent with mild pulmonary hypertension, severely dilated left atrium, mild MR, mild AR,  Stress Testing: No results found for this or any previous visit from the past 1095 days.  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 If he has angina symptoms in future, could consider PCI to distal RCA.  CT chest without contrast: 04/21/2021 1. Widespread patchy nodular thickening of the peribronchovascular interstitium throughout both lungs, most prominent in the upper lobes, with associated patchy peribronchovascular ground-glass opacity. Masslike 3.7 cm focus of consolidation in the anterior right lower lobe, new. 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 Follow-up outpatient high-resolution chest CT suggested in 3-6 months. 2. Mild cardiomegaly. Three-vessel coronary atherosclerosis. 3. Dilated main pulmonary artery, suggesting pulmonary arterial hypertension. 4. Ectatic 4.4 cm ascending thoracic aorta. Recommend annual imaging followup by CTA or MRA. This recommendation follows 2010 ACCF/AHA/AATS/ACR/ASA/SCA/SCAI/SIR/STS/SVM Guidelines for the Diagnosis and Management of Patients with Thoracic Aortic Disease. Circulation. 2010; 121: Q734-L937. Aortic aneurysm NOS (ICD10-I71.9). 5. Aortic Atherosclerosis (ICD10-I70.0).  LABORATORY DATA:    Latest Ref Rng & Units 01/21/2022    7:38 AM 01/21/2022    4:04 AM 01/20/2022   11:52 PM  CBC  WBC 4.0 - 10.5 K/uL 7.9    9.9    Hemoglobin 13.0 - 17.0 g/dL 11.2   11.2   12.5    Hematocrit 39.0 - 52.0 % 35.0   33.0   37.5    Platelets 150 - 400 K/uL 174    188         Latest Ref Rng & Units 01/23/2022    1:57 AM 01/22/2022    7:58 AM 01/21/2022    7:38 AM  CMP  Glucose 70 - 99 mg/dL 120   148   94    BUN 8 - 23 mg/dL _0 Creatinine 0.61  - 1.24 mg/dL 1.66   1.77   1.80    Sodium 135 - 145 mmol/L 145   144   139    Potassium 3.5 - 5.1 mmol/L 3.1   2.9   3.2    Chloride 98 - 111 mmol/L 107   106  106    CO2 22 - 32 mmol/L _0 Calcium 8.9 - 10.3 mg/dL 8.8   9.0   8.5    Total Protein 6.5 - 8.1 g/dL   6.1    Total Bilirubin 0.3 - 1.2 mg/dL   1.1    Alkaline Phos 38 - 126 U/L   71    AST 15 - 41 U/L   37    ALT 0 - 44 U/L   50      Lipid Panel     Component Value Date/Time   CHOL 111 04/09/2021 0147   TRIG 53 04/09/2021 0147   HDL 62 04/09/2021 0147   CHOLHDL 1.8 04/09/2021 0147   VLDL 11 04/09/2021 0147   LDLCALC 38 04/09/2021 0147    No components found for: [place]    03/06/21 0712 03/30/21 0707 04/06/21 0725 04/14/21 0712 05/06/21 0713 06/01/21 0713 09/21/21 0720  PROBNP 1,171* 2,643* 5,653* 3,473* 3,912* 1,994* 1,795*   No results for input(s): TSH in the last 8760 hours.   BMP Recent Labs    01/21/22 0738 01/22/22 0758 01/23/22 0157  NA 139 144 145  K 3.2* 2.9* 3.1*  CL 106 106 107  CO2 _1 GLUCOSE 94 148* 120*  BUN _2 CREATININE 1.80* 1.77* 1.66*  CALCIUM 8.5* 9.0 8.8*  GFRNONAA 41* 42* 45*    HEMOGLOBIN A1C Lab Results  Component Value Date   HGBA1C 5.7 (H) 04/08/2021   MPG 116.89 04/08/2021    External Labs: Collected: 12/08/2020 Creatinine 1.7 mg/dL. (Serum creatinine 1.6 mg/dL collected 07/11/2020) eGFR: 40.9 mL/min per 1.73 m Sodium 143, potassium 3.5, chloride 104, bicarb 32 Hemoglobin 12.6, hematocrit 38.2 NT proBNP 5576  IMPRESSION:    ICD-10-CM   1. Chronic heart failure with preserved ejection fraction (HFpEF) (HCC)  I50.32 Pro b natriuretic peptide (BNP)    Basic metabolic panel    Magnesium    2. NSVT (nonsustained ventricular tachycardia) (HCC)  I47.29     3. Essential hypertension  I10 EKG 12-Lead    4. Mixed hyperlipidemia  E78.2     5. Hx pulmonary embolism  Z86.711     6. OSA (obstructive sleep apnea)  G47.33      7. Sarcoidosis of lung ([place])  D86.0     8. Cigarette smoker  F17.210 nicotine (NICODERM CQ - DOSED IN MG/24 HR) 7 mg/24hr patch    nicotine polacrilex (COMMIT) 4 MG lozenge        RECOMMENDATIONS: [name] is a 66 y.o. male whose past medical history and cardiac risk factors include: Sarcoidosis, HTN, Tobacco abuse, Hx of PE (was on Xarelto for 1 year), OSA not on CPAP, MSSA Bacteremia (March 2022), Hx of cardiac arrest (2002 during his hospitalization for pancreatitis per wife).  Chronic heart failure with preserved ejection fraction (HFpEF) ([place]) Last hospitalization for congestive heart failure May 2023, prior to that was August 2022. Stage C, NYHA class II/III Appears euvolemic on physical examination. Most recent hospitalization exacerbated secondary to uncontrolled hypertension. Recent hospitalization records reviewed including H&P/discharge summary/provider progress notes. Medications reconciled. Status post hospitalization he was transitioned from Bumex to torsemide 20 mg p.o. daily. Check labs to evaluate kidney function, NT proBNP, and magnesium level. Monitor for now  NSVT (nonsustained ventricular tachycardia) (HCC) Currently on calcium and beta-blockers. Ischemic work-up recently performed and outlined above for further reference Monitor for now  Essential hypertension Office blood  pressures are now better controlled Medications reconciled. Reemphasized importance of low-salt diet.  Mixed hyperlipidemia Currently on atorvastatin.   He denies myalgia or other side effects.  OSA (obstructive sleep apnea) Intermittently uses CPAP, educated on importance of regular use/compliance.  Sarcoidosis of lung ([place]) Follows with pulmonary medicine.  Currently on prednisone.  Cigarette smoker Tobacco cessation counseling: Currently smoking 2 cigarettes/day Patient was informed of the dangers of tobacco abuse including stroke, cancer, and MI, as well as benefits  of tobacco cessation. Patient is willing to quit at this time. 74mins were spent counseling patient cessation techniques. We discussed various methods to help quit smoking, including deciding on a date to quit, joining a support group, pharmacological agents- nicotine gum/patch/lozenges.  Prescribed nicotine patches and lozenges. I will reassess his progress at the next follow-up visit  FINAL MEDICATION LIST END OF ENCOUNTER: Meds ordered this encounter  Medications   nicotine (NICODERM CQ - DOSED IN MG/24 HR) 7 mg/24hr patch    Sig: Place 1 patch (7 mg total) onto the skin daily.    Dispense:  30 patch    Refill:  0   nicotine polacrilex (COMMIT) 4 MG lozenge    Sig: Take 1 lozenge (4 mg total) by mouth as needed for up to 90 doses for smoking cessation.    Dispense:  100 tablet    Refill:  0     Medications Discontinued During This Encounter  Medication Reason   prednisoLONE acetate (PRED FORTE) 1 % ophthalmic suspension    gabapentin (NEURONTIN) 300 MG capsule    amoxicillin-clavulanate (AUGMENTIN) 875-125 MG tablet       Current Outpatient Medications:    aspirin 81 MG tablet, Take 1 tablet (81 mg total) by mouth daily., Disp: , Rfl:    atorvastatin (LIPITOR) 20 MG tablet, Take 20 mg by mouth daily., Disp: , Rfl:    Cholecalciferol (VITAMIN D) 125 MCG (5000 UT) CAPS, Take 1 capsule by mouth daily., Disp: , Rfl:    dapagliflozin propanediol (FARXIGA) 10 MG TABS tablet, Take 1 tablet (10 mg total) by mouth daily., Disp: 90 tablet, Rfl: 3   diltiazem (TIAZAC) 360 MG 24 hr capsule, TAKE ONE CAPSULE BY MOUTH EVERY DAY, Disp: 90 capsule, Rfl: 3   fluticasone (FLONASE) 50 MCG/ACT nasal spray, Place 2 sprays into both nostrils daily as needed for allergies or rhinitis., Disp: , Rfl:    guaifenesin (HUMIBID E) 400 MG TABS tablet, Take 400 mg by mouth every 4 (four) hours., Disp: , Rfl:    hydrALAZINE (APRESOLINE) 100 MG tablet, Take 1 tablet (100 mg total) by mouth 3 (three) times  daily., Disp: 90 tablet, Rfl: 3   isosorbide mononitrate (IMDUR) 120 MG 24 hr tablet, Take 1 tablet (120 mg total) by mouth daily., Disp: 30 tablet, Rfl: 1   metoprolol (TOPROL-XL) 200 MG 24 hr tablet, Take 1 tablet by mouth once daily (Patient taking differently: Take 200 mg by mouth daily.), Disp: 90 tablet, Rfl: 3   nicotine (NICODERM CQ - DOSED IN MG/24 HR) 7 mg/24hr patch, Place 1 patch (7 mg total) onto the skin daily., Disp: 30 patch, Rfl: 0   nicotine polacrilex (COMMIT) 4 MG lozenge, Take 1 lozenge (4 mg total) by mouth as needed for up to 90 doses for smoking cessation., Disp: 100 tablet, Rfl: 0   potassium chloride SA (KLOR-CON M) 20 MEQ tablet, Take 1 tablet  by mouth daily., Disp: 30 tablet, Rfl: 1   predniSONE (DELTASONE) 1 MG tablet, Take 1  tablet by mouth daily with breakfast., Disp: 90 tablet, Rfl: 0   Probiotic Product (PROBIOTIC DIGESTIVE SUPP) CAPS, Take 1 capsule by mouth daily., Disp: , Rfl:    sacubitril-valsartan (ENTRESTO) 97-103 MG, Take 1 tablet by mouth 2 (two) times daily., Disp: 60 tablet, Rfl: 3   tamsulosin (FLOMAX) 0.4 MG CAPS capsule, Take 1 capsule by mouth daily - need appointment for more refills, Disp: 30 capsule, Rfl: 6   torsemide (DEMADEX) 20 MG tablet, Take 2 tablets (40 mg total) by mouth daily. (Patient taking differently: Take 20 mg by mouth daily.), Disp: 60 tablet, Rfl: 1   traZODone (DESYREL) 150 MG tablet, Take 1 tablet (150 mg total) by mouth at bedtime as needed for sleep., Disp: 90 tablet, Rfl: 3   traZODone (DESYREL) 150 MG tablet, TAKE 1 TABLET AT BEDTIME, Disp: 90 tablet, Rfl: 3   triamcinolone cream (KENALOG) 0.1 %, Apply to affected areas twice a day as needed for itching/inflammation., Disp: 80 g, Rfl: 2  Orders Placed This Encounter  Procedures   Pro b natriuretic peptide (BNP)   Basic metabolic panel   Magnesium   EKG 12-Lead      There are no Patient Instructions on file for this visit.  --Continue cardiac medications as  reconciled in final medication list. --Return in about 3 months (around 05/08/2022) for Follow up, heart failure management.. Or sooner if needed. --Continue follow-up with your primary care physician regarding the management of your other chronic comorbid conditions.  Patient's questions and concerns were addressed to his satisfaction. He voices understanding of the instructions provided during this encounter.   This note was created using a voice recognition software as a result there may be grammatical errors inadvertently enclosed that do not reflect the nature of this encounter. Every attempt is made to correct such errors.   Rex Kras, [place], [facility]  Pager: [phone] Office: [phone]

## 2022-02-09 DIAGNOSIS — I5032 Chronic diastolic (congestive) heart failure: Secondary | ICD-10-CM | POA: Diagnosis not present

## 2022-02-10 LAB — BASIC METABOLIC PANEL
BUN/Creatinine Ratio: 8 — ABNORMAL LOW (ref 10–24)
BUN: 16 mg/dL (ref 8–27)
CO2: 28 mmol/L (ref 20–29)
Calcium: 9.3 mg/dL (ref 8.6–10.2)
Chloride: 101 mmol/L (ref 96–106)
Creatinine, Ser: 2.06 mg/dL — ABNORMAL HIGH (ref 0.76–1.27)
Glucose: 103 mg/dL — ABNORMAL HIGH (ref 70–99)
Potassium: 3.1 mmol/L — ABNORMAL LOW (ref 3.5–5.2)
Sodium: 141 mmol/L (ref 134–144)
eGFR: 35 mL/min/{1.73_m2} — ABNORMAL LOW (ref >59)

## 2022-02-10 LAB — PRO B NATRIURETIC PEPTIDE: NT-Pro BNP: 2662 pg/mL — ABNORMAL HIGH (ref 0–376)

## 2022-02-10 LAB — MAGNESIUM: Magnesium: 2.1 mg/dL (ref 1.6–2.3)

## 2022-02-12 ENCOUNTER — Other Ambulatory Visit (HOSPITAL_COMMUNITY): Payer: Self-pay

## 2022-02-13 ENCOUNTER — Other Ambulatory Visit (HOSPITAL_COMMUNITY): Payer: Self-pay

## 2022-02-15 ENCOUNTER — Other Ambulatory Visit (HOSPITAL_COMMUNITY): Payer: Self-pay

## 2022-02-16 ENCOUNTER — Other Ambulatory Visit (HOSPITAL_COMMUNITY): Payer: Self-pay

## 2022-02-16 ENCOUNTER — Other Ambulatory Visit (HOSPITAL_BASED_OUTPATIENT_CLINIC_OR_DEPARTMENT_OTHER): Payer: Self-pay

## 2022-02-16 NOTE — Progress Notes (Signed)
Tried calling patient --no answer

## 2022-02-17 ENCOUNTER — Other Ambulatory Visit (HOSPITAL_COMMUNITY): Payer: Self-pay

## 2022-02-17 ENCOUNTER — Other Ambulatory Visit: Payer: Self-pay

## 2022-02-17 DIAGNOSIS — I5032 Chronic diastolic (congestive) heart failure: Secondary | ICD-10-CM

## 2022-02-17 MED ORDER — LIDOCAINE 5 % EX PTCH
MEDICATED_PATCH | CUTANEOUS | 5 refills | Status: DC
  Filled 2022-02-17: qty 30, 30d supply, fill #0

## 2022-02-17 NOTE — Progress Notes (Signed)
Patient called back and he is aware orders have been released

## 2022-02-19 DIAGNOSIS — H401111 Primary open-angle glaucoma, right eye, mild stage: Secondary | ICD-10-CM | POA: Diagnosis not present

## 2022-02-20 ENCOUNTER — Other Ambulatory Visit (HOSPITAL_COMMUNITY): Payer: Self-pay

## 2022-02-22 ENCOUNTER — Other Ambulatory Visit (HOSPITAL_COMMUNITY): Payer: Self-pay

## 2022-02-23 ENCOUNTER — Ambulatory Visit: Payer: PPO | Admitting: Neurology

## 2022-02-23 ENCOUNTER — Other Ambulatory Visit (HOSPITAL_COMMUNITY): Payer: Self-pay

## 2022-02-23 DIAGNOSIS — I5032 Chronic diastolic (congestive) heart failure: Secondary | ICD-10-CM | POA: Diagnosis not present

## 2022-02-23 MED ORDER — HYDRALAZINE HCL 100 MG PO TABS
100.0000 mg | ORAL_TABLET | Freq: Three times a day (TID) | ORAL | 0 refills | Status: DC
  Filled 2022-02-23: qty 90, 30d supply, fill #0

## 2022-02-24 LAB — BASIC METABOLIC PANEL
BUN/Creatinine Ratio: 10 (ref 10–24)
BUN: 16 mg/dL (ref 8–27)
CO2: 25 mmol/L (ref 20–29)
Calcium: 9.4 mg/dL (ref 8.6–10.2)
Chloride: 106 mmol/L (ref 96–106)
Creatinine, Ser: 1.66 mg/dL — ABNORMAL HIGH (ref 0.76–1.27)
Glucose: 94 mg/dL (ref 70–99)
Potassium: 4.1 mmol/L (ref 3.5–5.2)
Sodium: 145 mmol/L — ABNORMAL HIGH (ref 134–144)
eGFR: 45 mL/min/{1.73_m2} — ABNORMAL LOW (ref >59)

## 2022-02-24 LAB — MAGNESIUM: Magnesium: 2.7 mg/dL — ABNORMAL HIGH (ref 1.6–2.3)

## 2022-02-24 LAB — PRO B NATRIURETIC PEPTIDE: NT-Pro BNP: 2247 pg/mL — ABNORMAL HIGH (ref 0–376)

## 2022-02-26 ENCOUNTER — Other Ambulatory Visit (HOSPITAL_COMMUNITY): Payer: Self-pay

## 2022-02-27 ENCOUNTER — Other Ambulatory Visit (HOSPITAL_COMMUNITY): Payer: Self-pay

## 2022-02-27 ENCOUNTER — Other Ambulatory Visit: Payer: Self-pay | Admitting: Cardiology

## 2022-02-27 DIAGNOSIS — F17210 Nicotine dependence, cigarettes, uncomplicated: Secondary | ICD-10-CM

## 2022-03-01 DIAGNOSIS — Z125 Encounter for screening for malignant neoplasm of prostate: Secondary | ICD-10-CM | POA: Diagnosis not present

## 2022-03-01 DIAGNOSIS — R7301 Impaired fasting glucose: Secondary | ICD-10-CM | POA: Diagnosis not present

## 2022-03-01 DIAGNOSIS — R5383 Other fatigue: Secondary | ICD-10-CM | POA: Diagnosis not present

## 2022-03-01 DIAGNOSIS — R7989 Other specified abnormal findings of blood chemistry: Secondary | ICD-10-CM | POA: Diagnosis not present

## 2022-03-01 DIAGNOSIS — E559 Vitamin D deficiency, unspecified: Secondary | ICD-10-CM | POA: Diagnosis not present

## 2022-03-01 DIAGNOSIS — E785 Hyperlipidemia, unspecified: Secondary | ICD-10-CM | POA: Diagnosis not present

## 2022-03-03 NOTE — Progress Notes (Signed)
Called pt, no answer. Left vm requesting call back?

## 2022-03-05 ENCOUNTER — Other Ambulatory Visit (HOSPITAL_COMMUNITY): Payer: Self-pay

## 2022-03-05 MED ORDER — NICOTINE 7 MG/24HR TD PT24
7.0000 mg | MEDICATED_PATCH | Freq: Every day | TRANSDERMAL | 0 refills | Status: AC
  Filled 2022-03-05: qty 30, 30d supply, fill #0
  Filled 2022-03-11: qty 28, 28d supply, fill #0

## 2022-03-05 NOTE — Progress Notes (Signed)
Called and spoke to patient he is aware

## 2022-03-05 NOTE — Telephone Encounter (Signed)
Refill request

## 2022-03-08 DIAGNOSIS — E785 Hyperlipidemia, unspecified: Secondary | ICD-10-CM | POA: Diagnosis not present

## 2022-03-08 DIAGNOSIS — N1831 Chronic kidney disease, stage 3a: Secondary | ICD-10-CM | POA: Diagnosis not present

## 2022-03-08 DIAGNOSIS — G4733 Obstructive sleep apnea (adult) (pediatric): Secondary | ICD-10-CM | POA: Diagnosis not present

## 2022-03-08 DIAGNOSIS — I5032 Chronic diastolic (congestive) heart failure: Secondary | ICD-10-CM | POA: Diagnosis not present

## 2022-03-08 DIAGNOSIS — I700 Atherosclerosis of aorta: Secondary | ICD-10-CM | POA: Diagnosis not present

## 2022-03-08 DIAGNOSIS — D692 Other nonthrombocytopenic purpura: Secondary | ICD-10-CM | POA: Diagnosis not present

## 2022-03-08 DIAGNOSIS — J449 Chronic obstructive pulmonary disease, unspecified: Secondary | ICD-10-CM | POA: Diagnosis not present

## 2022-03-08 DIAGNOSIS — E876 Hypokalemia: Secondary | ICD-10-CM | POA: Diagnosis not present

## 2022-03-08 DIAGNOSIS — Z0000 Encounter for general adult medical examination without abnormal findings: Secondary | ICD-10-CM | POA: Diagnosis not present

## 2022-03-08 DIAGNOSIS — M8580 Other specified disorders of bone density and structure, unspecified site: Secondary | ICD-10-CM | POA: Diagnosis not present

## 2022-03-08 DIAGNOSIS — R82998 Other abnormal findings in urine: Secondary | ICD-10-CM | POA: Diagnosis not present

## 2022-03-08 DIAGNOSIS — E669 Obesity, unspecified: Secondary | ICD-10-CM | POA: Diagnosis not present

## 2022-03-08 DIAGNOSIS — Z23 Encounter for immunization: Secondary | ICD-10-CM | POA: Diagnosis not present

## 2022-03-10 ENCOUNTER — Ambulatory Visit: Payer: PPO | Admitting: Neurology

## 2022-03-10 ENCOUNTER — Encounter: Payer: Self-pay | Admitting: Neurology

## 2022-03-10 VITALS — BP 129/85 | HR 67 | Ht 72.0 in | Wt 212.5 lb

## 2022-03-10 DIAGNOSIS — G4733 Obstructive sleep apnea (adult) (pediatric): Secondary | ICD-10-CM | POA: Diagnosis not present

## 2022-03-10 DIAGNOSIS — G4734 Idiopathic sleep related nonobstructive alveolar hypoventilation: Secondary | ICD-10-CM

## 2022-03-10 DIAGNOSIS — Z789 Other specified health status: Secondary | ICD-10-CM | POA: Diagnosis not present

## 2022-03-10 DIAGNOSIS — I509 Heart failure, unspecified: Secondary | ICD-10-CM | POA: Diagnosis not present

## 2022-03-10 DIAGNOSIS — D692 Other nonthrombocytopenic purpura: Secondary | ICD-10-CM | POA: Diagnosis not present

## 2022-03-10 DIAGNOSIS — Z7982 Long term (current) use of aspirin: Secondary | ICD-10-CM | POA: Diagnosis not present

## 2022-03-10 NOTE — Progress Notes (Signed)
SLEEP MEDICINE CLINIC    Provider:  Larey Seat, MD  Primary Care Physician:  Manuel Baton, MD [address] [name] [address]     Referring Provider: [name], Md [place],  [place] [address]   Dr. Terri Skains, DO, cardiology.  Dr. Posey Pronto, MD, Nephrology.          Chief Complaint according to patient   Patient presents with:     New Patient (Initial Visit)     pt alone, rm 10. presents [name] this was initial CPAP follow up apt. pt states he decided not to pursue the CPAP.  He did not bring the machine.  pt here to discuss results and if there is alternative.       Interval HISTORY :    03-10-2022: Rv with Manuel [name], a 66 year old patient who reports not being able to use his CPAP at all, can't sleep with CPAP with any mask, he is drooling and this bothers him. He was furnished with a nasal cradle FFM , wide, but this leaves a 4 mm room between nose and mask- he has never tightened it, but it seems not to fit him either.  His hypoxia as seen in his sleep studies doesn't make him a good condidate for  INSPIRE, and he lost weight - can he undergo a new HST to see what may have changed in his apnea at baseline? I think yes. CPAP has no data to review.      08-24-2021, Rv  encephalopathy. EEG normal, CPAP has not worked well for him, he didn't bring the machine with him. He reports condensation water in tubing and mask, he also uses a FFM.  Manuel [name] was diagnosed in a split-night polysomnography from 04-29-2021.  He was originally seen in referral from a hospitalist at [facility] after a stay.  Had a home sleep test in April 2021 had a 15.4 AHI at that time but strong REM dependency.  REM AHI was 37/h and he was tachy-bradycardic.  34 minutes of hypoxemia were noted and his repeat study in lab he did have again a REM dependent apnea form and prolonged hypoxia and he has truly woken up very frequently before CPAP was initiated he slept better and more  sustained when the CPAP was in place.  No added oxygen was needed CPAP at 10 cmH2O helped hypoxia" his apnea he was sleeping best in a reclined position with the chest and head of bed elevated. He is not a loud snorer, and reports improved nasal patency. Marland Kitchen  MMSE [name] 28/ 30 points.    12-22-2020: Manuel [name] is a 66 y.o. year old biracial male patient seen on 12/22/2020 , upon referral by HOSPITALIST. Chief concern according to patient :  I have the pleasure of meeting with Manuel [name] who is an established sleep patient in my practice but [name]'s visit was arranged by the hospitalist at [facility] after a stay in hospital between the 15th and 21 November 2020.  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  He was mildly hypoxic and his chest x-ray had demonstrated vascular congestion he was also febrile he had tested positive for COVID-19 in early February about 6 weeks earlier neurology was consulted to evaluate for encephalopathy and treated him empirically for meningitis encephalitis and LP was obtained that showed 1 red blood cell 1 white blood cells elevated glucose abnormal protein and MRI brain was unremarkable except for some small vessel disease or white matter disease which can be age-related and certainly will affect a sarcoidosis patient more as well.  Blood cultures have grown in 1 of 4 specimens culture-+with Staph epidermidis which was suspected to be a contamination and also MSSA which was treated as a true infection.   To rule out a pulmonary  embolism a VQ scan was a negative and empiric heparin was to discontinued the LP was not consistent with meningitis.  His mental status normalized with antibiotic therapy.  So the patient has a history of gram-positive bacteremia, or sarcoidosis which may not affect just the lung but also the brain, essential hypertension chronic kidney disease, tobacco abuse, obstructive sleep apnea in the past, currently not treated with CPAP.  Acute encephalopathy, acute renal failure, acute respiratory failure.   He was seen by his primary care physician Dr. Shon [name], I noticed that he had been given oxycodone after his shoulder surgery which may also impair his mental status and cognition, he was given Zofran in case of nausea, his hypertension was treated with hydralazine labetalol losartan, he is on Lipitor T of thiazide and Flonase he took took an aspirin daily.  Xanax as needed and albuterol inhaler as needed he was every day on Deltasone 5 mg.  He is also on trazodone at night for sleep.   Ammonia was normal one of his liver function test was severely elevated the other 1 was normal bilirubin was normal albumin was low which could be related to 3 days of nausea and not eating well.  He was also worked up as a code stroke because of his neuro deficit and expressive aphasia moderate patchy and ill-defined hypoattenuation within the cerebral white matter chronic small vessel disease no hemorrhage no acute stroke was found.  CT angiogram without stenosis at least not with at this present hemorrhage significantly stenosis.  The carotid arteries were patent, the patient has recovered since he was treated with antibiotics and has felt better, I have not followed him for sleep apnea since April last year when he had a repeat home sleep test and had a strongly REM sleep dependent sleep apnea.   He was overall mild with only 15.5 apneas and hypopneas per hour of sleep.  Given that the patient was intermittently well treated  with a narcotic for pain control after surgery it is very well positive that it induced additional respiratory dysfunction.       05-05-2020; I meeting [name] with Manuel [name]. Estis, all who underwent a home sleep test on December 10, 2019 his past medical history as stated below, he had mild sleep apnea with an AHI of 15.5 but this was strongly REM sleep dependent REM apnea-hypopnea index rose to 37/h he was borderline tachycardia bradycardic there was no positional component there were 34 minutes of hypoxemia time noted 8% of the recorded sleep time.  Based on this data CPAP is actually the only treatment option hypoxemia cannot be corrected by a dental device or inspire device, neither REM dependent apnea.  The patient has decided not  to pursue CPAP but there is really no alternative for this form of apnea.  He still has trouble to initiate sleep, but this is not related to an organic sleep disorder. Also home sleep test he fell asleep within 30 minutes.  He stayed asleep for much of the night there was 1 wakefulness around 1 AM and another 1 around 430 AM after which sleep became more fragmented.   HISTORY OF PRESENT ILLNESS:  Manuel [name] is a 66 y.o. year old biracial male patient seen on 03/10/2022 , upon referral by dr Virgina Jock. Chief concern according to patient :  " I don't think I have sleep apnea" .   I have the pleasure of seeing Manuel [name], a right -handed male with a possible sleep disorder. He   has a past medical history of Back pain, CKD (chronic kidney disease), stage III,  Diastolic dysfunction, ED (erectile dysfunction), GERD (gastroesophageal reflux disease), Hypercalcemia, Hyperlipidemia, renal Hypertension,  Long-term use of high-risk medication- including opioids, postsurgical delirium,  Nephrocalcinosis-lithiasis, Obesity, Osteopenia, Pancreatitis, PE (pulmonary embolism), Proteinuria, Sarcoidosis, and former smoker.  Dr. 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  Severe gallstone pancreatitis in July 2002 laparoscopic cholecystectomy in December 2002 and a tracheostomy at the time.  He has a history of recurrent retroperitoneal abscesses, osteopenia epididymitis, he has microalbuminuria proteinuria and CKD stage III.  His urologist is Dr. Ruby Cola, he had a kidney biopsy in June 2011.   Sleep relevant medical history: Nocturia/: 4-5 times, sleep walker in childhood, mother was too. Tonsillectomy at age 85, multiple  spine surgery, no deviated septum / no UPPP.    Family medical /sleep history: mother was sleep walking .    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   and lives in a household with 7 persons- with spouse, daughter and 4 grandchildren.  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 . Tobacco use- quit 10 years ago .  ETOH use : rare , Caffeine intake in form of Coffee(  Decaffeinated ) Soda( less than one a day ) Tea ( avoids ) or energy drinks. Regular exercise in form of walking    Hobbies : " Grandchildren"   Sleep habits are as follows:  The patient's dinner time is between 6 PM. The patient goes to bed at 10 PM where he falls asleep at about 12 midnight. He watches TV some evenings in the bedroom.   Once asleep he continues to sleep for 2 hours, wakes for 4-5 bathroom breaks, the first time at 2 AM.   The preferred sleep position is supine , with the support of 2 pillows.  Dreams are reportedly infrequent.  5-6 AM is the usual rise time. The patient wakes up spontaneously. He reports not feeling refreshed or restored in AM, with symptoms such as dry mouth , but no morning headaches, mainly residual fatigue.  Naps are  taken infrequently, lasting from 30 minutes and are more refreshing than nocturnal sleep.    Review of Systems: Out of a complete 14 system review, the patient complains of only the following symptoms, and all other reviewed systems are negative.:  Fatigue, sleepiness , snoring, fragmented  sleep, Insomnia , sleep hygiene -  He lost weight.   He seems forgetful     How likely are you to doze in the following situations: 0 = not likely, 1 = slight chance, 2 = moderate chance, 3 = high chance   Sitting and Reading? Watching Television? Sitting inactive in a public place (theater or meeting)? As a passenger in a car for an hour without a break? Lying down in the afternoon when circumstances permit? Sitting and talking to someone? Sitting quietly after lunch without alcohol? In a car, while stopped for a few minutes in traffic?   Total =he reports one one point on ESS- on 03-10-2022:  13 / 24 points he struggles with not falling asleep now- that is new. 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   Higher than before.   he can't stand using CPAP-  yet - has not spoken to DME or use about the difficulties, has not brought his machine.   Dawna Part was not recommended as first line treatment because of REM dependent. He never has had a CPAP titration, he didn't follow up.   He was furnished with a nasal cradle FFM , wide, but this leaves a 4 mm room between nose and mask- he has never tightened it, but it seems not to fit him either.  His hypoxia as seen in his sleep studies doesn't make him a good condidate for  INSPIRE, and he lost weight - can he undergo a new HST to see what may have changed in his apnea at baseline? I think yes. CPAP has no data to review.   Social History   Socioeconomic History   Marital status: Married    Spouse name: Building services engineer   Number of children: 2   Years of education: Not on file    Highest education level: Associate degree: academic program  Occupational History   Not on file  Tobacco Use   Smoking status: Former    Packs/day: 0.25    Years: 15.00    Total pack years: 3.75    Types: Cigarettes    Start date: 09/06/2005    Quit date: 02/23/2021    Years since quitting: 1.0   Smokeless tobacco: Never  Vaping Use   Vaping Use: Never used  Substance and Sexual Activity   Alcohol use: No    Alcohol/week: 0.0 standard drinks of alcohol   Drug use: No   Sexual activity: Yes  Other Topics Concern   Not on file  Social History Narrative   Not on file   Social Determinants of Health   Financial Resource Strain: Low Risk  (04/21/2021)   Overall Financial Resource Strain (CARDIA)    Difficulty of Paying Living Expenses: Not very hard  Food Insecurity: Not on file  Transportation Needs: No Transportation Needs (04/21/2021)   PRAPARE - Hydrologist (Medical): No    Lack of Transportation (Non-Medical): No  Physical Activity: Not on file  Stress: Not on file  Social Connections: Not on file    Family History  Problem Relation Age of Onset   Hypertension Father    CVA Father    Lung cancer Father    Alzheimer's disease Mother    Hypertension Sister    Heart failure Sister     Past Medical History:  Diagnosis Date   Back pain  CHF (congestive heart failure) (HCC)    CKD (chronic kidney disease), stage III (HCC)    CVA (cerebral infarction)    Diastolic dysfunction    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   ED (erectile dysfunction)    GERD (gastroesophageal reflux disease)    Hypercalcemia    Hyperlipidemia    Hypertension    Insomnia    Long-term use of high-risk medication    Nephrocalcinosis    Nephrolithiasis    Obesity    Osteopenia    Pancreatitis    admitted 03/19/01-06/05/01   PE (pulmonary embolism) 01/30/2013   Proteinuria    Sarcoidosis    Sleep apnea     Smoker    Stroke [facility])     Past Surgical History:  Procedure Laterality Date   ABDOMINAL EXPLORATION SURGERY     BACK SURGERY     CHOLECYSTECTOMY     RIGHT/LEFT HEART CATH AND CORONARY ANGIOGRAPHY N/A 05/12/2021   Procedure: RIGHT/LEFT HEART CATH AND CORONARY ANGIOGRAPHY;  Surgeon: [name], MD;  Location: [facility];  Service: Cardiovascular;  Laterality: N/A;   SHOULDER ARTHROSCOPY Right 10/30/2020   Procedure: ARTHROSCOPY SHOULDER WITH EXTENSIVE DEBRIDEMENT;  Surgeon: Mcarthur Rossetti, MD;  Location: [facility];  Service: Orthopedics;  Laterality: Right;   SHOULDER SURGERY     TRACHEOSTOMY     closed     Current Outpatient Medications on File Prior to Visit  Medication Sig Dispense Refill   aspirin 81 MG tablet Take 1 tablet (81 mg total) by mouth daily.     atorvastatin (LIPITOR) 20 MG tablet Take 20 mg by mouth daily.     Cholecalciferol (VITAMIN D) 125 MCG (5000 UT) CAPS Take 1 capsule by mouth daily.     dapagliflozin propanediol (FARXIGA) 10 MG TABS tablet Take 1 tablet (10 mg total) by mouth daily. 90 tablet 3   diltiazem (TIAZAC) 360 MG 24 hr capsule TAKE ONE CAPSULE BY MOUTH EVERY DAY 90 capsule 3   fluticasone (FLONASE) 50 MCG/ACT nasal spray Place 2 sprays into both nostrils daily as needed for allergies or rhinitis.     guaifenesin (HUMIBID E) 400 MG TABS tablet Take 400 mg by mouth every 4 (four) hours.     hydrALAZINE (APRESOLINE) 100 MG tablet Take 1 tablet by mouth 3 times daily. 90 tablet 0   isosorbide mononitrate (IMDUR) 120 MG 24 hr tablet Take 1 tablet (120 mg total) by mouth daily. 30 tablet 1   lidocaine (LIDODERM) 5 % Apply 1 patch to skin every day (May wear up to 12hours.) 30 patch 5   metoprolol (TOPROL-XL) 200 MG 24 hr tablet Take 1 tablet by mouth once daily (Patient taking differently: Take 200 mg by mouth daily.) 90 tablet 3   nicotine (NICODERM CQ - DOSED IN MG/24 HR) 7 mg/24hr patch Place 1 patch (7 mg total)  onto the skin daily. 30 patch 0   nicotine polacrilex (COMMIT) 4 MG lozenge Take 1 lozenge (4 mg total) by mouth as needed for up to 90 doses for smoking cessation. 100 tablet 0   potassium chloride SA (KLOR-CON M) 20 MEQ tablet Take 1 tablet by mouth daily. 30 tablet 1   predniSONE (DELTASONE) 1 MG tablet Take 1 tablet by mouth daily with breakfast. 90 tablet 0   Probiotic Product (PROBIOTIC DIGESTIVE SUPP) CAPS Take 1 capsule by mouth daily.     sacubitril-valsartan (ENTRESTO) 97-103 MG Take  1 tablet by mouth 2 (two) times daily. 60 tablet 3   tamsulosin (FLOMAX) 0.4 MG CAPS capsule Take 1 capsule by mouth daily - need appointment for more refills 30 capsule 6   torsemide (DEMADEX) 20 MG tablet Take 2 tablets (40 mg total) by mouth daily. (Patient taking differently: Take 20 mg by mouth daily.) 60 tablet 1   traZODone (DESYREL) 150 MG tablet Take 1 tablet (150 mg total) by mouth at bedtime as needed for sleep. 90 tablet 3   traZODone (DESYREL) 150 MG tablet TAKE 1 TABLET AT BEDTIME 90 tablet 3   triamcinolone cream (KENALOG) 0.1 % Apply to affected areas twice a day as needed for itching/inflammation. 80 g 2   No current facility-administered medications on file prior to visit.    Allergies  Allergen Reactions   Codeine Nausea Only   Hydrochlorothiazide     Unknown   Hydromorphone Other (See Comments)    hallucinations     Physical exam:  [name]'s Vitals   03/10/22 0840  BP: 129/85  Pulse: 67  Weight: 212 lb 8 oz (96.4 kg)  Height: 6' (1.829 m)   Body mass index is 28.82 kg/m.   Wt Readings from Last 3 Encounters:  03/10/22 212 lb 8 oz (96.4 kg)  02/05/22 208 lb 6.4 oz (94.5 kg)  01/25/22 218 lb 3.2 oz (99 kg)     Ht Readings from Last 3 Encounters:  03/10/22 6' (1.829 m)  02/05/22 6' (1.829 m)  01/25/22 6' (1.829 m)      General: The patient is awake, alert and appears not in acute distress.  Head: Normocephalic, atraumatic. Neck is supple. Mallampati 4,  neck  circumference:18 inches . Nasal airflow is patent.  Retrognathia is seen.   Cardiovascular:  Regular rate and cardiac rhythm by pulse,  without distended neck veins. Respiratory: Lungs are clear to auscultation.  Skin:  Without evidence of ankle edema, or rash.  Trunk: The patient's posture is stooped.    Neurologic exam : The patient is awake and alert, oriented to place and time.   Memory subjective described as intact. His wife feels he is much more forgetful, and has  slowed word retrieving.  mini-mental status exam      12/22/2020   10:49 AM  Montreal Cognitive Assessment   Visuospatial/ Executive (0/5) 1  Naming (0/3) 3  Attention: Read list of digits (0/2) 2  Attention: Read list of letters (0/1) 1  Attention: Serial 7 subtraction starting at 100 (0/3) 3  Language: Repeat phrase (0/2) 1  Language : Fluency (0/1) 0  Abstraction (0/2) 2  Delayed Recall (0/5) 1  Orientation (0/6) 5  Total 19      08/24/2021    8:12 AM 12/22/2020   10:54 AM  MMSE - Mini Mental State Exam  Orientation to time 4 5  Orientation to Place 5 4  Registration 3 3  Attention/ Calculation 4 5  Recall 3 2  Language- name 2 objects 2 2  Language- repeat 1 1  Language- follow 3 step command 3 3  Language- read & follow direction 1 1  Write a sentence 1 1  Copy design 1 1  Total score 28 28         Attention span & concentration ability appears improved. Speech is fluent, with dysarthria,mild dysphonia / no witnessed aphasia.  Mood and affect : he is sluggish, seems depressed.   CN : no loss of smell or taste, Pupil equal, left ptosis,  full EOM and accomodation.  Facial  ( lower) symmetry otherwise.  He has inability to lift the left eyebrow-   Bells palsy on the left.  Normal Neck and shoulder ROM.    Normal muscle tone. No cogwheeling.  No handwriting changes. No postural gait change, he has still shuffling gait and walks  slowly, but steadily. Marland Kitchen  His left arm did not produce  any arm swing and he kept his left hand fisted by walking he turns his 4 steps and previously turns with 3 steps.  His right foot is everted pointing outwards and he could not tiptoe on his right foot.  There is some weakness in the right lower extremity. His rotator cuff surgery has brought some positive results and he has normal arm swing on that side - he is right hand -dominant.   Assessment :  OSA, mild but REM accentuated and associated with hypoxia in 2021: CPAP intolerant.  He was furnished with a nasal cradle FFM , wide, but this leaves a 4 mm room between nose and mask- he has never tightened it, but it seems not to fit him either.  His hypoxia as seen in his sleep studies doesn't make him a good condidate for  INSPIRE, and he lost weight - can he undergo a new HST to see what may have changed in his apnea at baseline? I think yes. CPAP has no data to review.       After spending a total time of 20 minutes face to face including time for physical and neurologic examination, review of laboratory studies, personal review of imaging studies, reports and results of other testing and review of referral information / records as far as provided in visit, I have established the following assessments:  1) Manuel [name] has now a BMI of 28.8 , 03-10-2022.    2) he had Encephalopathy, fully recovered. A stroke was not found, but he had pulmonary edema, had mental status changes, had abnormal labs, and one out of 4 CSF cultures was positive.   3)He has now first time followed up on his PSG , brought his new CPAP, and this had no data of use for over 90 days.OSA, mild but REM accentuated and associated with hypoxia in 2021: CPAP intolerant.  He was furnished with a nasal cradle FFM , wide, but this leaves a 4 mm room between nose and mask- he has never tightened it, but it seems not to fit him either.  His hypoxia as seen in his sleep studies doesn't make him a good condidate for  INSPIRE, and he lost  weight - can he undergo a new HST to see what may have changed in his apnea at baseline? I think yes. CPAP has no data to review.    4) He quit smoking 2020 - congratulations.    My Plan is to proceed with:  1) Repeat a HST to see if his weight loss has had an effect on baseline hypoxia and apnea. He is unlikely to be treatable with inspire and not keen on surgery, a dental device will also not likely work for his constellation of symptoms.    I would like to thank Manuel Baton, MD , [address] [place],  [place] [address] for allowing me to meet with his patient.   CC: I will share my notes with Dr Virgina Jock , Dr. Jeffie Pollock , Dr Manuel [name].   Electronically signed by: Manuel Seat, MD 03/10/2022 9:09 AM  [facility] and  [employer] Sleep Board certified by [employer] of Sleep Medicine and Diplomate of the [employer] of Sleep Medicine. Board certified In Neurology through the [employer], Fellow of the [employer] of Neurology. Medical Director of [employer].

## 2022-03-11 ENCOUNTER — Telehealth: Payer: Self-pay | Admitting: Neurology

## 2022-03-11 ENCOUNTER — Other Ambulatory Visit (HOSPITAL_COMMUNITY): Payer: Self-pay

## 2022-03-11 NOTE — Telephone Encounter (Signed)
HTA pending faxed notes 

## 2022-03-12 ENCOUNTER — Other Ambulatory Visit (HOSPITAL_COMMUNITY): Payer: Self-pay

## 2022-03-15 ENCOUNTER — Other Ambulatory Visit: Payer: PPO

## 2022-03-17 ENCOUNTER — Other Ambulatory Visit (HOSPITAL_COMMUNITY): Payer: Self-pay

## 2022-03-18 ENCOUNTER — Ambulatory Visit: Payer: PPO | Admitting: Podiatry

## 2022-03-18 NOTE — Telephone Encounter (Signed)
LVM for pt to call back to schedule   HTA auth: [id number] (exp. 03/11/22 to 06/09/22)

## 2022-03-22 ENCOUNTER — Ambulatory Visit: Payer: PPO | Admitting: Podiatry

## 2022-03-22 ENCOUNTER — Other Ambulatory Visit (HOSPITAL_COMMUNITY): Payer: Self-pay

## 2022-03-22 DIAGNOSIS — L603 Nail dystrophy: Secondary | ICD-10-CM

## 2022-03-22 NOTE — Progress Notes (Signed)
  Subjective:  Patient ID: [name], male    DOB: 09-21-1955,  MRN: [id number]  Chief Complaint  Patient presents with   Toe Injury    Patient is present today for his left great toenail follow up. Patient states that he still has some soreness in that left great toe. Patient also states that he notice that his left 2nd toe was bleeding but doesn't remember injuring that 2nd left toe.    66 y.o. male presents with the above complaint. History confirmed with patient.  Still has some sensitivity in the toenail, it is improving.  He cut the left second toenail this morning while trimming his nails  Objective:  Physical Exam: warm, good capillary refill, no trophic changes or ulcerative lesions, normal DP and PT pulses, and normal sensory exam. Left Foot: hallux nail dystrophic, significant growth since his last visit probably 75% improvement     Assessment:   1. Nail dystrophy      Plan:  Patient was evaluated and treated and all questions answered.  Dystrophic nail is growing out and is having improvement.  I recommend allowing this to continue to heal.  The nail was debrided with a sharp nail nipper and mechanical bur today.  He will return as needed  No follow-ups on file.

## 2022-03-27 ENCOUNTER — Other Ambulatory Visit: Payer: Self-pay | Admitting: Student

## 2022-03-27 ENCOUNTER — Other Ambulatory Visit (HOSPITAL_COMMUNITY): Payer: Self-pay

## 2022-03-29 ENCOUNTER — Ambulatory Visit: Payer: PPO | Admitting: Student

## 2022-03-29 ENCOUNTER — Other Ambulatory Visit (HOSPITAL_COMMUNITY): Payer: Self-pay

## 2022-03-29 MED ORDER — ENTRESTO 97-103 MG PO TABS
1.0000 | ORAL_TABLET | Freq: Two times a day (BID) | ORAL | 3 refills | Status: DC
Start: 2022-03-29 — End: 2022-09-30
  Filled 2022-03-29: qty 60, 30d supply, fill #0
  Filled 2022-05-11: qty 60, 30d supply, fill #1
  Filled 2022-06-19: qty 60, 30d supply, fill #2
  Filled 2022-08-22: qty 60, 30d supply, fill #3

## 2022-03-29 MED ORDER — HYDRALAZINE HCL 100 MG PO TABS
ORAL_TABLET | ORAL | 1 refills | Status: DC
  Filled 2022-03-29: qty 270, 90d supply, fill #0
  Filled 2022-08-26: qty 270, 90d supply, fill #1

## 2022-03-30 ENCOUNTER — Other Ambulatory Visit (HOSPITAL_COMMUNITY): Payer: Self-pay

## 2022-03-30 NOTE — Telephone Encounter (Signed)
I called the patient again and spoke with him. HST- HTA auth: [id number] (exp. 03/11/22 to 06/09/22)  he is scheduled at [facility] for 04/20/22 at 8 Am.  I mailed packet to the patient.

## 2022-03-31 ENCOUNTER — Other Ambulatory Visit (HOSPITAL_COMMUNITY): Payer: Self-pay

## 2022-04-01 ENCOUNTER — Other Ambulatory Visit (HOSPITAL_COMMUNITY): Payer: Self-pay

## 2022-04-03 ENCOUNTER — Other Ambulatory Visit (HOSPITAL_COMMUNITY): Payer: Self-pay

## 2022-04-05 ENCOUNTER — Other Ambulatory Visit (HOSPITAL_COMMUNITY): Payer: Self-pay

## 2022-04-05 DIAGNOSIS — N183 Chronic kidney disease, stage 3 unspecified: Secondary | ICD-10-CM | POA: Diagnosis not present

## 2022-04-05 DIAGNOSIS — H401131 Primary open-angle glaucoma, bilateral, mild stage: Secondary | ICD-10-CM | POA: Diagnosis not present

## 2022-04-05 DIAGNOSIS — N2581 Secondary hyperparathyroidism of renal origin: Secondary | ICD-10-CM | POA: Diagnosis not present

## 2022-04-05 DIAGNOSIS — D631 Anemia in chronic kidney disease: Secondary | ICD-10-CM | POA: Diagnosis not present

## 2022-04-05 DIAGNOSIS — I10 Essential (primary) hypertension: Secondary | ICD-10-CM | POA: Diagnosis not present

## 2022-04-05 DIAGNOSIS — I129 Hypertensive chronic kidney disease with stage 1 through stage 4 chronic kidney disease, or unspecified chronic kidney disease: Secondary | ICD-10-CM | POA: Diagnosis not present

## 2022-04-05 DIAGNOSIS — N051 Unspecified nephritic syndrome with focal and segmental glomerular lesions: Secondary | ICD-10-CM | POA: Diagnosis not present

## 2022-04-09 ENCOUNTER — Other Ambulatory Visit (HOSPITAL_COMMUNITY): Payer: Self-pay

## 2022-04-09 DIAGNOSIS — Z1211 Encounter for screening for malignant neoplasm of colon: Secondary | ICD-10-CM | POA: Diagnosis not present

## 2022-04-09 DIAGNOSIS — K5900 Constipation, unspecified: Secondary | ICD-10-CM | POA: Diagnosis not present

## 2022-04-10 ENCOUNTER — Other Ambulatory Visit (HOSPITAL_COMMUNITY): Payer: Self-pay

## 2022-04-16 ENCOUNTER — Other Ambulatory Visit (HOSPITAL_COMMUNITY): Payer: Self-pay

## 2022-04-17 ENCOUNTER — Other Ambulatory Visit (HOSPITAL_COMMUNITY): Payer: Self-pay

## 2022-04-20 ENCOUNTER — Ambulatory Visit: Payer: PPO | Admitting: Neurology

## 2022-04-20 DIAGNOSIS — G4733 Obstructive sleep apnea (adult) (pediatric): Secondary | ICD-10-CM

## 2022-04-20 DIAGNOSIS — Z789 Other specified health status: Secondary | ICD-10-CM

## 2022-04-21 ENCOUNTER — Other Ambulatory Visit (HOSPITAL_COMMUNITY): Payer: Self-pay

## 2022-04-21 DIAGNOSIS — L218 Other seborrheic dermatitis: Secondary | ICD-10-CM | POA: Diagnosis not present

## 2022-04-21 DIAGNOSIS — Z85828 Personal history of other malignant neoplasm of skin: Secondary | ICD-10-CM | POA: Diagnosis not present

## 2022-04-21 MED ORDER — TRIAMCINOLONE ACETONIDE 0.1 % EX CREA
TOPICAL_CREAM | CUTANEOUS | 3 refills | Status: DC
  Filled 2022-04-21: qty 80, 30d supply, fill #0

## 2022-04-21 NOTE — Progress Notes (Signed)
[facility] TEST REPORT ( by Watch PAT)   STUDY DATE:  04-21-2022 Male, 66 y.o., 1956-06-19 MRN: [id number]   ORDERING CLINICIAN: Larey Seat, MD  REFERRING CLINICIAN:  [name], MD  Cc: Cardiologist  Dr Azalia Bilis, and Nephrologist Dr. Posey Pronto, MD.   CLINICAL INFORMATION/HISTORY: 03-10-2022: Rv with [name], a 66 year old patient ,who reports not being able to use his CPAP at all, he can't sleep with CPAP, rejects any mask, he is drooling - he has not contacted the DME , has not brought the machine in today- no data to review- and he wants to discuss alternative treatment options.   His hypoxia as seen in his sleep studies doesn't make him a good condidate for  INSPIRE, but he lost weight - can he undergo a new HST to see what may have changed in his apnea at baseline?  I think yes. CPAP is for him no acceptable option- his SPLIT night PS 04-29-2021 was performed under a FFM- CPAP at 10 cm water reached an AHI of 0/h but patient needed an elevated head of bed.   Epworth sleepiness score:  13/24. FSS at 36/63 points. Was a night shift worker.    BMI: 28.7 kg/m   Neck Circumference: 18"   FINDINGS:   Sleep Summary:   Total Recording Time (hours, min): 8 hours and 24 minutes of which 7 hours and 10 minutes was a total sleep time.  REM sleep was present for 24.1% of total sleep time.      Respiratory Indices:   Calculated pAHI (per hour): 40.5/h.                            REM pAHI:   60.5/h                                              NREM pAHI:   34.2/h                           Positional AHI: The patient slept for over 200 minutes in the right lateral position associated with an AHI of 36.1, in supine sleep his AHI was 41.4/h and an left sided sleep 59.3/h.  Snoring level reached a mean volume of 41 dB which is considered mild.  Snoring was present for about 18% of total sleep time.  Sleep was moderately fragmented.                                                   Oxygen Saturation Statistics:    Minimum oxygen saturation (%):    The oxygen nadir was 87%        O2 Saturation Range (%):   Between 87 and 98% saturation with a mean saturation of 92%                                    O2 Saturation (minutes) <89%:     2.6 minutes      Pulse Rate Statistics:   Pulse Mean (bpm):  58 bpm            Pulse Range:      Between 45 and 85 bpm           IMPRESSION:  This HST confirms the presence of severe obstructive sleep apnea and very much exacerbated during REM sleep as well.  Remarkably , there is only mild to moderate snoring present and no clinically significant degree of hypoxemia was found.    RECOMMENDATION: I am surprised  that the AHI has doubled since last test . No oxygen is needed.   My recommendation would still be positive airway pressure therapy for this patient, given the REM sleep AHI.  The AHI for this HST broke down as shown here: about 120 apneas and hypopneas during REM sleep, versus 160 events in NREM sleep.   PS :I want to set the expectations here: An inspire device cannot treat REM sleep dependent AHI,  it works only well to correct the non-REM sleep AHI.  REM sleep AHI is an expression of hypoventilation-  much more dependent on the strength of diaphragmatic movement and restricted by chest wall motor strength and by abdominal weight.  Bringing the tongue forward is not helping here. Neither can a dental device help much. A total reduction of 50-60% of the AHI is not enough to prevent strokes and cardiac disease in the future.   Remaining  recommendations are further weight loss, core strength build up, sleeping with the upper body elevated, eliminating alcohol and muscle relaxants, improving sleep hygiene.       INTERPRETING PHYSICIAN:   Larey Seat, MD   Medical Director of [facility] at [facility].

## 2022-04-23 ENCOUNTER — Telehealth: Payer: Self-pay | Admitting: Neurology

## 2022-04-23 ENCOUNTER — Other Ambulatory Visit (HOSPITAL_COMMUNITY): Payer: Self-pay

## 2022-04-23 NOTE — Telephone Encounter (Signed)
04-21-2022, HST , [employer].  IMPRESSION:  This HST confirms the presence of severe obstructive sleep apnea and very much exacerbated during REM sleep as well.  Remarkably , there is only mild to moderate snoring present and no clinically significant degree of hypoxemia was found.     RECOMMENDATION: I am surprised  that the AHI has doubled since last test . No oxygen is needed.    My recommendation would still be positive airway pressure therapy for this patient, given the REM sleep AHI.  The AHI for this HST broke down as shown here: about 120 apneas and hypopneas during REM sleep, versus 160 events in NREM sleep.    PS :I want to set the expectations of alternative treatments:  An inspire device cannot treat REM sleep dependent AHI,  it works only well to correct the non-REM sleep AHI.  REM sleep AHI is an expression of hypoventilation-  much more dependent on the strength of diaphragmatic movement and restriction by chest wall motor strength and by abdominal weight.  Bringing the tongue forward is not helping here. Neither can a dental device help much. A total reduction of 50-60% of the AHI is not enough to prevent strokes and cardiac disease in the future.    Remaining  recommendations are further weight loss, core strength build up, sleeping with the upper body elevated, eliminating alcohol and muscle relaxants, improving sleep hygiene.          INTERPRETING PHYSICIAN:    Larey Seat, MD    Medical Director of [facility] at [facility].

## 2022-04-25 ENCOUNTER — Other Ambulatory Visit (HOSPITAL_COMMUNITY): Payer: Self-pay

## 2022-04-27 NOTE — Telephone Encounter (Signed)
Called patient to discuss sleep study results. No answer at this time. LVM for the patient to call back.   

## 2022-04-29 ENCOUNTER — Other Ambulatory Visit (HOSPITAL_COMMUNITY): Payer: Self-pay

## 2022-05-01 ENCOUNTER — Other Ambulatory Visit (HOSPITAL_COMMUNITY): Payer: Self-pay

## 2022-05-04 ENCOUNTER — Other Ambulatory Visit: Payer: PPO

## 2022-05-07 ENCOUNTER — Ambulatory Visit: Payer: PPO

## 2022-05-07 ENCOUNTER — Other Ambulatory Visit: Payer: Self-pay

## 2022-05-07 ENCOUNTER — Other Ambulatory Visit (HOSPITAL_COMMUNITY): Payer: Self-pay

## 2022-05-07 DIAGNOSIS — I5032 Chronic diastolic (congestive) heart failure: Secondary | ICD-10-CM | POA: Diagnosis not present

## 2022-05-07 MED ORDER — POTASSIUM CHLORIDE CRYS ER 20 MEQ PO TBCR
20.0000 meq | EXTENDED_RELEASE_TABLET | Freq: Every day | ORAL | 1 refills | Status: DC
  Filled 2022-05-07: qty 90, 90d supply, fill #0
  Filled 2022-08-26: qty 90, 90d supply, fill #1

## 2022-05-07 MED ORDER — TORSEMIDE 20 MG PO TABS
20.0000 mg | ORAL_TABLET | Freq: Every day | ORAL | 1 refills | Status: DC
  Filled 2022-05-07: qty 90, 90d supply, fill #0
  Filled 2022-08-22: qty 90, 90d supply, fill #1

## 2022-05-11 ENCOUNTER — Ambulatory Visit: Payer: PPO | Admitting: Cardiology

## 2022-05-11 ENCOUNTER — Other Ambulatory Visit (HOSPITAL_COMMUNITY): Payer: Self-pay

## 2022-05-13 ENCOUNTER — Other Ambulatory Visit (HOSPITAL_COMMUNITY): Payer: Self-pay

## 2022-05-14 ENCOUNTER — Other Ambulatory Visit (HOSPITAL_COMMUNITY): Payer: Self-pay

## 2022-05-14 MED ORDER — ATORVASTATIN CALCIUM 20 MG PO TABS
20.0000 mg | ORAL_TABLET | Freq: Every day | ORAL | 3 refills | Status: DC
Start: 2022-05-14 — End: 2022-05-20
  Filled 2022-05-14 – 2022-05-17 (×2): qty 90, 90d supply, fill #0

## 2022-05-17 ENCOUNTER — Other Ambulatory Visit (HOSPITAL_COMMUNITY): Payer: Self-pay

## 2022-05-18 ENCOUNTER — Other Ambulatory Visit (HOSPITAL_COMMUNITY): Payer: Self-pay

## 2022-05-20 ENCOUNTER — Encounter: Payer: Self-pay | Admitting: Internal Medicine

## 2022-05-20 ENCOUNTER — Ambulatory Visit: Payer: PPO | Admitting: Internal Medicine

## 2022-05-20 ENCOUNTER — Other Ambulatory Visit (HOSPITAL_COMMUNITY): Payer: Self-pay

## 2022-05-20 VITALS — BP 125/90 | HR 64 | Temp 97.8°F | Resp 16 | Ht 72.0 in | Wt 214.0 lb

## 2022-05-20 DIAGNOSIS — D860 Sarcoidosis of lung: Secondary | ICD-10-CM | POA: Diagnosis not present

## 2022-05-20 DIAGNOSIS — I4729 Other ventricular tachycardia: Secondary | ICD-10-CM

## 2022-05-20 DIAGNOSIS — Z86711 Personal history of pulmonary embolism: Secondary | ICD-10-CM | POA: Diagnosis not present

## 2022-05-20 DIAGNOSIS — E782 Mixed hyperlipidemia: Secondary | ICD-10-CM | POA: Diagnosis not present

## 2022-05-20 DIAGNOSIS — I10 Essential (primary) hypertension: Secondary | ICD-10-CM | POA: Diagnosis not present

## 2022-05-20 DIAGNOSIS — I5032 Chronic diastolic (congestive) heart failure: Secondary | ICD-10-CM | POA: Diagnosis not present

## 2022-05-20 MED ORDER — NICOTINE 21 MG/24HR TD PT24
21.0000 mg | MEDICATED_PATCH | Freq: Every day | TRANSDERMAL | 1 refills | Status: DC
  Filled 2022-05-20: qty 28, 28d supply, fill #0

## 2022-05-20 MED ORDER — DIAZEPAM 5 MG PO TABS
10.0000 mg | ORAL_TABLET | Freq: Once | ORAL | 0 refills | Status: DC
  Filled 2022-05-20: qty 2, 1d supply, fill #0

## 2022-05-20 NOTE — Progress Notes (Signed)
ID:  [name], DOB 03/28/1956, MRN [id number]  PCP:  [name], MD  Cardiologist: Rex Kras, DO, [facility] (established care 12/10/2020)  Date: 05/20/22 Last Office Visit: 06/10/2021  No chief complaint on file.   HPI  [name] is a 66 y.o. 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  In the past patient has had multiple hospitalizations for shortness of breath secondary to his pulmonary conditions as well as HFpEF.  His medications have been uptitrated and was doing well until recently he was hospitalized in May 2023 for acute exacerbation of HFpEF likely due to uncontrolled hypertension.  During his hospitalization he was diuresed appropriately and was discharged home on torsemide as opposed Bumex.   He stopped smoking 2 months ago and is using nicotine patches. He is walking 3 times a week for approximately 1 mile. He is following a low sodium, heart healthy diet. He denies chest pain, dyspnea, leg swelling, or fatigue.  FUNCTIONAL STATUS: No structured exercise program or daily routine.   ALLERGIES: Allergies  Allergen Reactions   Codeine Nausea Only   Hydrochlorothiazide     Unknown   Hydrocodone-Acetaminophen Nausea And Vomiting   Hydromorphone Other (See Comments)    hallucinations     MEDICATION LIST PRIOR TO VISIT: Current Meds  Medication Sig   aspirin 81 MG tablet Take 1 tablet (81 mg total) by mouth daily.   atorvastatin (LIPITOR) 20 MG tablet Take 20 mg by mouth daily.   Cholecalciferol (VITAMIN D) 125 MCG (5000 UT) CAPS Take 1 capsule by mouth daily.   dapagliflozin  propanediol (FARXIGA) 10 MG TABS tablet Take 1 tablet (10 mg total) by mouth daily.   diltiazem (TIAZAC) 360 MG 24 hr capsule TAKE ONE CAPSULE BY MOUTH EVERY DAY   fluticasone (FLONASE) 50 MCG/ACT nasal spray Place 2 sprays into both nostrils daily as needed for allergies or rhinitis.   guaifenesin (HUMIBID E) 400 MG TABS tablet Take 400 mg by mouth every 4 (four) hours.   hydrALAZINE (APRESOLINE) 100 MG tablet Take 1 tablet (100 mg total) by mouth 3 (three) times daily.   isosorbide mononitrate (IMDUR) 120 MG 24 hr tablet Take 1 tablet (120 mg total) by mouth daily.   lidocaine (LIDODERM) 5 % Apply 1 patch to skin every day (May wear up to 12hours.)   metoprolol (TOPROL-XL) 200 MG 24 hr tablet Take 1 tablet by mouth once daily (Patient taking differently: Take 200 mg by mouth daily.)   nicotine polacrilex (COMMIT) 4 MG lozenge Take 1 lozenge (4 mg total) by mouth as needed for up to 90 doses for smoking cessation.   potassium chloride SA (KLOR-CON M) 20 MEQ tablet Take 1 tablet by mouth daily.   Probiotic Product (PROBIOTIC DIGESTIVE SUPP) CAPS Take 1 capsule by mouth daily.   sacubitril-valsartan (ENTRESTO) 97-103 MG Take 1 tablet by mouth 2 (two) times daily.   tamsulosin (FLOMAX) 0.4 MG CAPS capsule Take 1 capsule by mouth daily - need appointment for more refills   torsemide (DEMADEX) 20 MG tablet Take 1 tablet (20 mg total) by mouth daily.   traZODone (DESYREL) 150 MG tablet Take 1 tablet (150 mg total) by  mouth at bedtime as needed for sleep.   triamcinolone cream (KENALOG) 0.1 % Apply to affected areas twice a day as needed for itching/inflammation.     PAST MEDICAL HISTORY: 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   ED (erectile dysfunction)    GERD  (gastroesophageal reflux disease)    Hypercalcemia    Hyperlipidemia    Hypertension    Insomnia    Long-term use of high-risk medication    Nephrocalcinosis    Nephrolithiasis    Obesity    Osteopenia    Pancreatitis    admitted 03/19/01-06/05/01   PE (pulmonary embolism) 01/30/2013   Proteinuria    Sarcoidosis    Sleep apnea    Smoker    Stroke [facility])   Patient and wife denies hx of CVA.   PAST SURGICAL HISTORY: Past Surgical History:  Procedure Laterality Date   ABDOMINAL EXPLORATION SURGERY     BACK SURGERY     CHOLECYSTECTOMY     RIGHT/LEFT HEART CATH AND CORONARY ANGIOGRAPHY N/A 05/12/2021   Procedure: RIGHT/LEFT HEART CATH AND CORONARY ANGIOGRAPHY;  Surgeon: [name], MD;  Location: [place] [facility];  Service: Cardiovascular;  Laterality: N/A;   SHOULDER ARTHROSCOPY Right 10/30/2020   Procedure: ARTHROSCOPY SHOULDER WITH EXTENSIVE DEBRIDEMENT;  Surgeon: Mcarthur Rossetti, MD;  Location: [place];  Service: Orthopedics;  Laterality: Right;   SHOULDER SURGERY     TRACHEOSTOMY     closed    FAMILY HISTORY: The patient family history includes Alzheimer's disease in his mother; CVA in his father; Heart failure in his sister; Hypertension in his father and sister; Lung cancer in his father.  SOCIAL HISTORY:  The patient  reports that he quit smoking about 14 months ago. His smoking use included cigarettes. He started smoking about 16 years ago. He has a 3.75 pack-year smoking history. He has never used smokeless tobacco. He reports that he does not drink alcohol and does not use drugs.  REVIEW OF SYSTEMS: Review of Systems  Constitutional: Negative for malaise/fatigue, weight gain and weight loss.  Cardiovascular:  Negative for chest pain, dyspnea on exertion (Chronic and stable), leg swelling, orthopnea, palpitations, paroxysmal nocturnal dyspnea and syncope.  Respiratory:  Positive for cough (Dry, nonproductive). Negative for shortness of  breath (Chronic and stable).     PHYSICAL EXAM: Physical Exam Neck:     Vascular: No carotid bruit.  Cardiovascular:     Rate and Rhythm: Normal rate and regular rhythm.     Pulses: Normal pulses.          Carotid pulses are 2+ on the right side and 2+ on the left side.      Radial pulses are 2+ on the right side and 2+ on the left side.       Femoral pulses are 2+ on the right side and 2+ on the left side.      Popliteal pulses are 2+ on the right side and 2+ on the left side.       Dorsalis pedis pulses are 2+ on the right side and 2+ on the left side.       Posterior tibial pulses are 2+ on the right side  and 2+ on the left side.     Heart sounds: S1 normal and S2 normal. No murmur heard. Pulmonary:     Effort: Pulmonary effort is normal.     Breath sounds: Normal breath sounds. No wheezing or rales.     Comments: Diminished lung sounds at bilateral bases Abdominal:     General: Bowel sounds are normal.     Palpations: Abdomen is soft.  Musculoskeletal:     Cervical back: Neck supple.     Right lower leg: No edema.     Left lower leg: No edema.  Neurological:     Mental Status: He is alert.       05/20/2022    9:10 AM 03/10/2022    8:40 AM 02/05/2022   11:19 AM  Vitals with BMI  Height '6\' 0"'  '6\' 0"'  '6\' 0"'   Weight 214 lbs 212 lbs 8 oz 208 lbs 6 oz  BMI 29.02 67.34 19.37  Systolic 902 409 735  Diastolic 90 85 79  Pulse 64 67 60   CARDIAC DATABASE: EKG: 12/18/2020: Sinus bradycardia, 58 bpm, left atrial enlargement, diffuse T wave inversions.  No significant change compared to prior ECG.  Echocardiogram: 05/07/2022: 1. Normal LV systolic function with visual EF 55-60%. Left ventricle size is decreased. Severe concentric hypertrophy of the left ventricle. Normal global wall motion. Indeterminate diastolic filling pattern, normal LAP. Calculated EF 59%. 2. Structurally normal trileaflet aortic valve. Trace aortic regurgitation. 3. Structurally normal tricuspid valve with  trace regurgitation. No evidence of pulmonary hypertension. 4. No significant change compared to 12/2020.  Stress Testing: No results found for this or any previous visit from the past 1095 days.  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 If he has angina symptoms in future, could consider PCI to distal RCA.  CT chest without contrast: 04/21/2021 1. Widespread patchy nodular thickening of the peribronchovascular interstitium throughout both lungs, most prominent in the upper lobes, with associated patchy peribronchovascular ground-glass opacity. Masslike 3.7 cm focus of consolidation in the anterior right lower lobe, new. 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 Follow-up outpatient high-resolution chest CT suggested in 3-6 months. 2. Mild cardiomegaly. Three-vessel coronary atherosclerosis. 3. Dilated main pulmonary artery, suggesting pulmonary arterial hypertension. 4. Ectatic 4.4 cm ascending thoracic aorta. Recommend annual imaging followup by CTA or MRA. This recommendation follows 2010 ACCF/AHA/AATS/ACR/ASA/SCA/SCAI/SIR/STS/SVM Guidelines for the Diagnosis and Management of Patients with Thoracic Aortic Disease. Circulation. 2010; 121: H299-M426. Aortic aneurysm NOS (ICD10-I71.9). 5. Aortic Atherosclerosis (ICD10-I70.0).  LABORATORY DATA:    Latest Ref Rng & Units 01/21/2022    7:38 AM 01/21/2022    4:04 AM 01/20/2022   11:52 PM  CBC  WBC 4.0 - 10.5 K/uL 7.9   9.9   Hemoglobin 13.0  - 17.0 g/dL 11.2  11.2  12.5   Hematocrit 39.0 - 52.0 % 35.0  33.0  37.5   Platelets  150 - 400 K/uL 174   188        Latest Ref Rng & Units 02/23/2022    7:08 AM 02/09/2022    7:13 AM 01/23/2022    1:57 AM  CMP  Glucose 70 - 99 mg/dL 94  103  120   BUN 8 - 27 mg/dL '16  16  20   ' Creatinine 0.76 - 1.27 mg/dL 1.66  2.06  1.66   Sodium 134 - 144 mmol/L 145  141  145   Potassium 3.5 - 5.2 mmol/L 4.1  3.1  3.1   Chloride 96 - 106 mmol/L 106  101  107   CO2 20 - 29 mmol/L '25  28  30   ' Calcium 8.6 - 10.2 mg/dL 9.4  9.3  8.8     Lipid Panel     Component Value Date/Time   CHOL 111 04/09/2021 0147   TRIG 53 04/09/2021 0147   HDL 62 04/09/2021 0147   CHOLHDL 1.8 04/09/2021 0147   VLDL 11 04/09/2021 0147   LDLCALC 38 04/09/2021 0147    No components found for: "NTPROBNP" Recent Labs    06/01/21 0713 09/21/21 0720 02/09/22 0713 02/23/22 0709  PROBNP 1,994* 1,795* 2,662* 2,247*   No results for input(s): "TSH" in the last 8760 hours.   BMP Recent Labs    01/21/22 0738 01/22/22 0758 01/23/22 0157 02/09/22 0713 02/23/22 0708  NA 139 144 145 141 145*  K 3.2* 2.9* 3.1* 3.1* 4.1  CL 106 106 107 101 106  CO2 '26 30 30 28 25  ' GLUCOSE 94 148* 120* 103* 94  BUN '17 17 20 16 16  ' CREATININE 1.80* 1.77* 1.66* 2.06* 1.66*  CALCIUM 8.5* 9.0 8.8* 9.3 9.4  GFRNONAA 41* 42* 45*  --   --     HEMOGLOBIN A1C Lab Results  Component Value Date   HGBA1C 5.7 (H) 04/08/2021   MPG 116.89 04/08/2021    External Labs: Collected: 12/08/2020 Creatinine 1.7 mg/dL. (Serum creatinine 1.6 mg/dL collected 07/11/2020) eGFR: 40.9 mL/min per 1.73 m Sodium 143, potassium 3.5, chloride 104, bicarb 32 Hemoglobin 12.6, hematocrit 38.2 NT proBNP 5576  IMPRESSION:    ICD-10-CM   1. Chronic heart failure with preserved ejection fraction (HFpEF) (HCC)  I50.32     2. Essential hypertension  I10     3. Mixed hyperlipidemia  E78.2     4. NSVT (nonsustained ventricular tachycardia) (HCC)  I47.29      5. Sarcoidosis of lung ([place])  D86.0     6. Hx pulmonary embolism  Z86.711      Medications Discontinued During This Encounter  Medication Reason   atorvastatin (LIPITOR) 20 MG tablet    diazepam (VALIUM) 5 MG tablet    traZODone (DESYREL) 150 MG tablet    triamcinolone cream (KENALOG) 0.1 %     Current Outpatient Medications:    aspirin 81 MG tablet, Take 1 tablet (81 mg total) by mouth daily., Disp: , Rfl:    atorvastatin (LIPITOR) 20 MG tablet, Take 20 mg by mouth daily., Disp: , Rfl:    Cholecalciferol (VITAMIN D) 125 MCG (5000 UT) CAPS, Take 1 capsule by mouth daily., Disp: , Rfl:    dapagliflozin propanediol (FARXIGA) 10 MG TABS tablet, Take 1 tablet (10 mg total) by mouth daily., Disp: 90 tablet, Rfl: 3   diltiazem (TIAZAC) 360 MG 24 hr capsule, TAKE ONE CAPSULE BY MOUTH EVERY DAY, Disp: 90 capsule, Rfl: 3   fluticasone (FLONASE) 50 MCG/ACT nasal spray, Place  2 sprays into both nostrils daily as needed for allergies or rhinitis., Disp: , Rfl:    guaifenesin (HUMIBID E) 400 MG TABS tablet, Take 400 mg by mouth every 4 (four) hours., Disp: , Rfl:    hydrALAZINE (APRESOLINE) 100 MG tablet, Take 1 tablet (100 mg total) by mouth 3 (three) times daily., Disp: 270 tablet, Rfl: 1   isosorbide mononitrate (IMDUR) 120 MG 24 hr tablet, Take 1 tablet (120 mg total) by mouth daily., Disp: 30 tablet, Rfl: 1   lidocaine (LIDODERM) 5 %, Apply 1 patch to skin every day (May wear up to 12hours.), Disp: 30 patch, Rfl: 5   metoprolol (TOPROL-XL) 200 MG 24 hr tablet, Take 1 tablet by mouth once daily (Patient taking differently: Take 200 mg by mouth daily.), Disp: 90 tablet, Rfl: 3   nicotine polacrilex (COMMIT) 4 MG lozenge, Take 1 lozenge (4 mg total) by mouth as needed for up to 90 doses for smoking cessation., Disp: 100 tablet, Rfl: 0   potassium chloride SA (KLOR-CON M) 20 MEQ tablet, Take 1 tablet by mouth daily., Disp: 90 tablet, Rfl: 1   Probiotic Product (PROBIOTIC DIGESTIVE SUPP) CAPS, Take 1  capsule by mouth daily., Disp: , Rfl:    sacubitril-valsartan (ENTRESTO) 97-103 MG, Take 1 tablet by mouth 2 (two) times daily., Disp: 60 tablet, Rfl: 3   tamsulosin (FLOMAX) 0.4 MG CAPS capsule, Take 1 capsule by mouth daily - need appointment for more refills, Disp: 30 capsule, Rfl: 6   torsemide (DEMADEX) 20 MG tablet, Take 1 tablet (20 mg total) by mouth daily., Disp: 90 tablet, Rfl: 1   traZODone (DESYREL) 150 MG tablet, Take 1 tablet (150 mg total) by mouth at bedtime as needed for sleep., Disp: 90 tablet, Rfl: 3   triamcinolone cream (KENALOG) 0.1 %, Apply to affected areas twice a day as needed for itching/inflammation., Disp: 80 g, Rfl: 2  No orders of the defined types were placed in this encounter.  RECOMMENDATIONS:  [name] is a 66 y.o. male whose past medical history and cardiac risk factors include: Sarcoidosis, HTN, Tobacco abuse, Hx of PE (was on Xarelto for 1 year), OSA not on CPAP, MSSA Bacteremia (March 2022), Hx of cardiac arrest (2002 during his hospitalization for pancreatitis per wife).  Chronic heart failure with preserved ejection fraction (HFpEF) ([place]) Last hospitalization for congestive heart failure May 2023, prior to that was August 2022. Stage C, NYHA class II/III Appears euvolemic on physical examination. Most recent hospitalization exacerbated secondary to uncontrolled hypertension. Recent hospitalization records reviewed including H&P/discharge summary/provider progress notes. Continue current medications without changes Status post hospitalization he was transitioned from Bumex to torsemide 20 mg p.o. daily. Labs reviewed  NSVT (nonsustained ventricular tachycardia) (HCC) Currently on calcium and beta-blockers. Ischemic work-up recently performed and outlined above for further reference  Essential hypertension Office blood pressures are now better controlled Continue current medications Reemphasized importance of low-salt diet.  Mixed  hyperlipidemia Currently on atorvastatin.   He denies myalgia or other side effects.  OSA (obstructive sleep apnea) Intermittently uses CPAP, educated on importance of regular use/compliance.  Sarcoidosis of lung ([place]) Follows with pulmonary medicine.  Cigarette smoker He is stopped smoking 2 months ago, currently using nicotine patches Refilled nicotine patches I will reassess his progress at the next follow-up visit  FINAL MEDICATION LIST END OF ENCOUNTER: No orders of the defined types were placed in this encounter.  Follow-up in 6 months or sooner if needed    [facility], DO,  [facility] Office: [phone]

## 2022-05-22 ENCOUNTER — Other Ambulatory Visit: Payer: Self-pay | Admitting: Cardiology

## 2022-05-22 DIAGNOSIS — I5031 Acute diastolic (congestive) heart failure: Secondary | ICD-10-CM

## 2022-05-24 ENCOUNTER — Other Ambulatory Visit (HOSPITAL_COMMUNITY): Payer: Self-pay

## 2022-05-24 MED ORDER — DAPAGLIFLOZIN PROPANEDIOL 10 MG PO TABS
10.0000 mg | ORAL_TABLET | Freq: Every day | ORAL | 3 refills | Status: AC
  Filled 2022-05-24: qty 30, 30d supply, fill #0
  Filled 2022-07-31: qty 30, 30d supply, fill #1
  Filled 2022-08-22: qty 30, 30d supply, fill #2
  Filled 2022-09-19: qty 30, 30d supply, fill #3
  Filled 2022-10-22: qty 30, 30d supply, fill #4
  Filled 2022-11-28: qty 30, 30d supply, fill #5
  Filled 2022-12-17 – 2023-01-03 (×2): qty 30, 30d supply, fill #6
  Filled 2023-01-26 – 2023-02-01 (×2): qty 30, 30d supply, fill #7
  Filled 2023-03-09: qty 30, 30d supply, fill #8
  Filled 2023-04-04 – 2023-04-15 (×2): qty 30, 30d supply, fill #9
  Filled 2023-05-09: qty 30, 30d supply, fill #10

## 2022-05-25 ENCOUNTER — Other Ambulatory Visit: Payer: Self-pay | Admitting: Orthopedic Surgery

## 2022-05-25 DIAGNOSIS — M4326 Fusion of spine, lumbar region: Secondary | ICD-10-CM

## 2022-05-25 DIAGNOSIS — M545 Low back pain, unspecified: Secondary | ICD-10-CM

## 2022-05-25 DIAGNOSIS — M5416 Radiculopathy, lumbar region: Secondary | ICD-10-CM

## 2022-05-28 ENCOUNTER — Other Ambulatory Visit (HOSPITAL_COMMUNITY): Payer: Self-pay

## 2022-05-31 ENCOUNTER — Encounter: Payer: Self-pay | Admitting: Neurology

## 2022-05-31 DIAGNOSIS — G4733 Obstructive sleep apnea (adult) (pediatric): Secondary | ICD-10-CM

## 2022-06-01 ENCOUNTER — Other Ambulatory Visit (HOSPITAL_COMMUNITY): Payer: Self-pay

## 2022-06-03 NOTE — Telephone Encounter (Signed)
Called and spoke w/ pt about sleep study results. He is agreeable to retry CPAP therapy. Aware we will send orders to [facility]. Scheduled initial cpap for 07/22/22 at 9am with AL,NP.  Sent pt mychart message with this info.

## 2022-06-08 DIAGNOSIS — I10 Essential (primary) hypertension: Secondary | ICD-10-CM | POA: Diagnosis not present

## 2022-06-11 ENCOUNTER — Other Ambulatory Visit (HOSPITAL_COMMUNITY): Payer: Self-pay

## 2022-06-11 MED ORDER — DIAZEPAM 5 MG PO TABS
5.0000 mg | ORAL_TABLET | Freq: Every day | ORAL | 0 refills | Status: DC
  Filled 2022-06-11: qty 2, 2d supply, fill #0

## 2022-06-12 ENCOUNTER — Ambulatory Visit
Admission: RE | Admit: 2022-06-12 | Discharge: 2022-06-12 | Disposition: A | Discharge status: Home / Self Care | Payer: PPO | Source: Ambulatory Visit | Attending: Orthopedic Surgery | Admitting: Orthopedic Surgery

## 2022-06-12 DIAGNOSIS — M545 Low back pain, unspecified: Secondary | ICD-10-CM

## 2022-06-12 DIAGNOSIS — M4316 Spondylolisthesis, lumbar region: Secondary | ICD-10-CM | POA: Diagnosis not present

## 2022-06-12 DIAGNOSIS — M4326 Fusion of spine, lumbar region: Secondary | ICD-10-CM

## 2022-06-12 DIAGNOSIS — M5416 Radiculopathy, lumbar region: Secondary | ICD-10-CM

## 2022-06-12 DIAGNOSIS — M5127 Other intervertebral disc displacement, lumbosacral region: Secondary | ICD-10-CM | POA: Diagnosis not present

## 2022-06-12 MED ORDER — GADOBENATE DIMEGLUMINE 529 MG/ML IV SOLN
20.0000 mL | Freq: Once | INTRAVENOUS | Status: AC | PRN
  Administered 2022-06-12: 20 mL via INTRAVENOUS

## 2022-06-19 ENCOUNTER — Other Ambulatory Visit (HOSPITAL_COMMUNITY): Payer: Self-pay

## 2022-06-21 ENCOUNTER — Other Ambulatory Visit (HOSPITAL_COMMUNITY): Payer: Self-pay

## 2022-06-21 MED ORDER — METOPROLOL SUCCINATE ER 200 MG PO TB24
200.0000 mg | ORAL_TABLET | Freq: Every day | ORAL | 3 refills | Status: DC
  Filled 2022-06-21: qty 90, 90d supply, fill #0
  Filled 2022-10-22: qty 90, 90d supply, fill #1
  Filled 2023-01-26: qty 90, 90d supply, fill #2
  Filled 2023-05-09: qty 90, 90d supply, fill #3

## 2022-06-23 ENCOUNTER — Observation Stay (HOSPITAL_BASED_OUTPATIENT_CLINIC_OR_DEPARTMENT_OTHER)
Admission: EM | Admit: 2022-06-23 | Discharge: 2022-06-29 | Disposition: A | Discharge status: Skilled Nursing Facility | Payer: PPO | Attending: Internal Medicine | Admitting: Internal Medicine

## 2022-06-23 ENCOUNTER — Emergency Department (HOSPITAL_BASED_OUTPATIENT_CLINIC_OR_DEPARTMENT_OTHER): Payer: PPO

## 2022-06-23 ENCOUNTER — Encounter (HOSPITAL_BASED_OUTPATIENT_CLINIC_OR_DEPARTMENT_OTHER): Payer: Self-pay | Admitting: Emergency Medicine

## 2022-06-23 ENCOUNTER — Other Ambulatory Visit: Payer: Self-pay

## 2022-06-23 ENCOUNTER — Encounter (HOSPITAL_COMMUNITY): Payer: Self-pay

## 2022-06-23 DIAGNOSIS — E876 Hypokalemia: Secondary | ICD-10-CM | POA: Diagnosis not present

## 2022-06-23 DIAGNOSIS — S93324A Dislocation of tarsometatarsal joint of right foot, initial encounter: Secondary | ICD-10-CM | POA: Diagnosis not present

## 2022-06-23 DIAGNOSIS — S92314A Nondisplaced fracture of first metatarsal bone, right foot, initial encounter for closed fracture: Principal | ICD-10-CM | POA: Diagnosis present

## 2022-06-23 DIAGNOSIS — S99921A Unspecified injury of right foot, initial encounter: Secondary | ICD-10-CM | POA: Diagnosis present

## 2022-06-23 DIAGNOSIS — N1832 Chronic kidney disease, stage 3b: Secondary | ICD-10-CM | POA: Insufficient documentation

## 2022-06-23 DIAGNOSIS — Z7984 Long term (current) use of oral hypoglycemic drugs: Secondary | ICD-10-CM | POA: Diagnosis not present

## 2022-06-23 DIAGNOSIS — N179 Acute kidney failure, unspecified: Secondary | ICD-10-CM | POA: Diagnosis present

## 2022-06-23 DIAGNOSIS — W010XXA Fall on same level from slipping, tripping and stumbling without subsequent striking against object, initial encounter: Secondary | ICD-10-CM | POA: Insufficient documentation

## 2022-06-23 DIAGNOSIS — I5032 Chronic diastolic (congestive) heart failure: Secondary | ICD-10-CM | POA: Diagnosis not present

## 2022-06-23 DIAGNOSIS — Z8673 Personal history of transient ischemic attack (TIA), and cerebral infarction without residual deficits: Secondary | ICD-10-CM | POA: Diagnosis not present

## 2022-06-23 DIAGNOSIS — Z7982 Long term (current) use of aspirin: Secondary | ICD-10-CM | POA: Insufficient documentation

## 2022-06-23 DIAGNOSIS — Z86711 Personal history of pulmonary embolism: Secondary | ICD-10-CM | POA: Diagnosis not present

## 2022-06-23 DIAGNOSIS — S0990XA Unspecified injury of head, initial encounter: Secondary | ICD-10-CM | POA: Insufficient documentation

## 2022-06-23 DIAGNOSIS — Y92002 Bathroom of unspecified non-institutional (private) residence single-family (private) house as the place of occurrence of the external cause: Secondary | ICD-10-CM | POA: Diagnosis not present

## 2022-06-23 DIAGNOSIS — Z79899 Other long term (current) drug therapy: Secondary | ICD-10-CM | POA: Insufficient documentation

## 2022-06-23 DIAGNOSIS — E785 Hyperlipidemia, unspecified: Secondary | ICD-10-CM | POA: Diagnosis present

## 2022-06-23 DIAGNOSIS — Z87891 Personal history of nicotine dependence: Secondary | ICD-10-CM | POA: Insufficient documentation

## 2022-06-23 DIAGNOSIS — M7989 Other specified soft tissue disorders: Secondary | ICD-10-CM | POA: Diagnosis not present

## 2022-06-23 DIAGNOSIS — R600 Localized edema: Secondary | ICD-10-CM | POA: Diagnosis not present

## 2022-06-23 DIAGNOSIS — Y92009 Unspecified place in unspecified non-institutional (private) residence as the place of occurrence of the external cause: Secondary | ICD-10-CM

## 2022-06-23 DIAGNOSIS — W19XXXA Unspecified fall, initial encounter: Secondary | ICD-10-CM

## 2022-06-23 DIAGNOSIS — S199XXA Unspecified injury of neck, initial encounter: Secondary | ICD-10-CM | POA: Diagnosis not present

## 2022-06-23 DIAGNOSIS — I130 Hypertensive heart and chronic kidney disease with heart failure and stage 1 through stage 4 chronic kidney disease, or unspecified chronic kidney disease: Secondary | ICD-10-CM | POA: Insufficient documentation

## 2022-06-23 DIAGNOSIS — S098XXA Other specified injuries of head, initial encounter: Secondary | ICD-10-CM | POA: Diagnosis not present

## 2022-06-23 HISTORY — DX: Nondisplaced fracture of first metatarsal bone, right foot, initial encounter for closed fracture: S92.314A

## 2022-06-23 MED ORDER — OXYCODONE-ACETAMINOPHEN 5-325 MG PO TABS
1.0000 | ORAL_TABLET | Freq: Once | ORAL | Status: AC
  Administered 2022-06-23: 1 via ORAL
  Filled 2022-06-23: qty 1

## 2022-06-23 MED ORDER — ENOXAPARIN SODIUM 40 MG/0.4ML IJ SOSY
40.0000 mg | PREFILLED_SYRINGE | Freq: Once | INTRAMUSCULAR | Status: AC
  Administered 2022-06-23: 40 mg via SUBCUTANEOUS
  Filled 2022-06-23: qty 0.4

## 2022-06-23 MED ORDER — HYDROMORPHONE HCL 1 MG/ML IJ SOLN
1.0000 mg | Freq: Once | INTRAMUSCULAR | Status: AC
  Administered 2022-06-23: 1 mg via INTRAMUSCULAR
  Filled 2022-06-23: qty 1

## 2022-06-23 MED ORDER — ENOXAPARIN SODIUM 40 MG/0.4ML IJ SOSY
40.0000 mg | PREFILLED_SYRINGE | Freq: Every day | INTRAMUSCULAR | Status: DC
  Administered 2022-06-24 – 2022-06-25 (×2): 40 mg via SUBCUTANEOUS
  Filled 2022-06-23 (×2): qty 0.4

## 2022-06-23 MED ORDER — ONDANSETRON 4 MG PO TBDP
4.0000 mg | ORAL_TABLET | Freq: Once | ORAL | Status: AC
  Administered 2022-06-23: 4 mg via ORAL
  Filled 2022-06-23: qty 1

## 2022-06-23 NOTE — ED Triage Notes (Signed)
Fall in bathroom, trip over mat. Injury to right foot/ankle Hit back of head. Denies loc, but felt dizzy after. Foot swelling, bruising, +cap refill

## 2022-06-23 NOTE — Progress Notes (Signed)
  [facility] will assume care on arrival to accepting facility. Until arrival, care as per EDP. However, [facility] available 24/7 for questions and assistance.   Nursing staff please page [facility] Admits and Consults ([phone]) as soon as the patient arrives to the hospital.  Kaedyn Belardo, DO [facility]  

## 2022-06-23 NOTE — ED Provider Notes (Signed)
[facility] Provider Note   CSN: [id number] Arrival date & time: 06/23/22  1756     History  Chief Complaint  Patient presents with   [name] is a 66 y.o. male.  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  He states that same occurred around 1 PM today when he was in the bathroom and tripped over the bath mat.  He states that he did hit the back of his head but did not lose consciousness.  He is not on anticoagulation.  States that he did initially feel dizzy and lightheaded afterwards but that has since resolved.  He has not been able to ambulate since the incident due to right foot pain.  Also endorses mild headache and neck pain.  Denies any blurred vision, diplopia, numbness/tingling, or sharp shooting pain down his extremities.  The history is provided by the patient. No language interpreter was used.  Fall       Home Medications Prior to Admission medications   Medication Sig Start Date End Date Taking? Authorizing Provider  aspirin 81 MG tablet Take 1 tablet (81 mg total) by mouth daily. 02/01/13   [name], MD  atorvastatin (LIPITOR) 20 MG tablet Take 20 mg by mouth daily.    [provider]  Cholecalciferol (VITAMIN D) 125 MCG (5000 UT) CAPS Take 1 capsule by mouth daily.    [provider]  dapagliflozin propanediol (FARXIGA) 10 MG TABS tablet Take 1 tablet (10 mg total) by mouth daily. 05/24/22 08/22/22  Custovic, Collene Mares, DO  diazepam (VALIUM) 5 MG tablet Take 1-2 tabs 60 minutes before procedure as needed (if having injection in office then take at office. do not take prior to arrival) 06/11/22     diltiazem (TIAZAC) 360 MG 24 hr capsule TAKE ONE CAPSULE BY MOUTH EVERY DAY 10/22/21     fluticasone (FLONASE) 50 MCG/ACT nasal spray Place 2 sprays into both nostrils daily as needed for allergies or  rhinitis.    [provider]  guaifenesin (HUMIBID E) 400 MG TABS tablet Take 400 mg by mouth every 4 (four) hours.    [provider]  hydrALAZINE (APRESOLINE) 100 MG tablet Take 1 tablet (100 mg total) by mouth 3 (three) times daily. 03/28/22     isosorbide mononitrate (IMDUR) 120 MG 24 hr tablet Take 1 tablet (120 mg total) by mouth daily. 01/24/22   Shelly Coss, MD  lidocaine (LIDODERM) 5 % Apply 1 patch to skin every day (May wear up to 12hours.) 02/17/22     metoprolol (TOPROL-XL) 200 MG 24 hr tablet Take 1 tablet (200 mg total) by mouth daily. 06/20/22     nicotine (NICOTINE STEP 1) 21 mg/24hr patch Place 1 patch (21 mg total) onto the skin daily. 05/20/22   Custovic, Collene Mares, DO  nicotine polacrilex (COMMIT) 4 MG lozenge Take 1 lozenge (4 mg total) by mouth as needed for up to 90 doses for smoking cessation. 02/05/22   Tolia, Sunit, DO  potassium chloride SA (KLOR-CON M) 20 MEQ tablet Take 1 tablet by mouth daily. 05/07/22   Tolia, Sunit, DO  Probiotic Product (PROBIOTIC DIGESTIVE SUPP) CAPS Take 1 capsule by mouth daily.    [provider]  sacubitril-valsartan (ENTRESTO) 97-103 MG Take 1 tablet by mouth 2 (two) times daily. 03/29/22   Cantwell, Celeste C, PA-C  tamsulosin (FLOMAX) 0.4 MG CAPS capsule Take 1  capsule by mouth daily - need appointment for more refills 02/02/22     torsemide (DEMADEX) 20 MG tablet Take 1 tablet (20 mg total) by mouth daily. 05/07/22   Tolia, Sunit, DO  traZODone (DESYREL) 150 MG tablet Take 1 tablet (150 mg total) by mouth at bedtime as needed for sleep. 12/22/20   Dohmeier, Asencion Partridge, MD  triamcinolone cream (KENALOG) 0.1 % Apply to affected areas twice a day as needed for itching/inflammation. 09/09/21         Allergies    Codeine, Hydrochlorothiazide, Hydrocodone-acetaminophen, and Hydromorphone    Review of Systems   Review of Systems  Musculoskeletal:  Positive for arthralgias.  All other systems reviewed and are negative.   Physical  Exam Updated Vital Signs BP 97/63 (BP Location: Right Arm)   Pulse (!) 52   Temp 98.1 F (36.7 C) (Oral)   Resp 16   SpO2 96%  Physical Exam Vitals and nursing note reviewed.  Constitutional:      General: He is not in acute distress.    Appearance: Normal appearance. He is normal weight. He is not ill-appearing, toxic-appearing or diaphoretic.  HENT:     Head: Normocephalic and atraumatic.     Comments: No battle sign or raccoon eyes Eyes:     Extraocular Movements: Extraocular movements intact.     Pupils: Pupils are equal, round, and reactive to light.  Neck:     Comments: No midline cervical spine tenderness. Cardiovascular:     Rate and Rhythm: Normal rate.  Pulmonary:     Effort: Pulmonary effort is normal. No respiratory distress.  Musculoskeletal:        General: Normal range of motion.     Cervical back: Normal range of motion.     Comments: Substantial swelling and bruising noted to the right foot and ankle.  ROM limited due to pain and swelling.  DP and PT pulses intact and 2+.  Capillary refill less than 2 seconds.  Distal sensation intact.  Compartments soft.  Superficial abrasion noted to the medial right foot.  No evidence of foreign body or bone protruding from wound.  See images below for further  Skin:    General: Skin is warm and dry.  Neurological:     General: No focal deficit present.     Mental Status: He is alert and oriented to person, place, and time.  Psychiatric:        Mood and Affect: Mood normal.        Behavior: Behavior normal.      ED Results / Procedures / Treatments   Labs (all labs ordered are listed, but only abnormal results are displayed) Labs Reviewed - No data to display  EKG None  Radiology DG Foot Complete Right  Result Date: 06/23/2022 CLINICAL DATA:  Trauma, fall EXAM: RIGHT FOOT COMPLETE - 3+ VIEW COMPARISON:  None Available. FINDINGS: There is comminuted undisplaced fracture in the base and shaft of proximal phalanx  of big toe. There is comminuted fracture in the necks of right second and third metatarsals with medial displacement of distal fracture fragments. There is a proximally 5 mm distraction of fracture fragments. There is faint lucency in the medial aspect of head of the fourth metacarpal seen in the AP view which may be a normal variation or suggest undisplaced fracture. There is comminuted fracture in the base of first metatarsal. Comminuted fracture is seen in the base of second metatarsal. As far as seen, there is no significant medial  displacement of base of the first metatarsal or lateral displacement of bases of the third, fourth and fifth metatarsals. There is widening of space between bases of the first and second metatarsals. There is offset in alignment of distal articular surfaces of medial and middle cuneiform. There is marked soft tissue swelling over the dorsum of the foot. IMPRESSION: Comminuted undisplaced fracture is seen in the proximal phalanx of right big toe. Comminuted displaced fractures are seen in the distal portions of right second and third metatarsals. Comminuted fractures are seen in bases of first and second metatarsals. Possibility of Lisfranc type injury should be considered. Follow-up CT as clinically warranted may be considered. Electronically Signed   By: Elmer Picker M.D.   On: 06/23/2022 19:52    Procedures Procedures    Medications Ordered in ED Medications  oxyCODONE-acetaminophen (PERCOCET/ROXICET) 5-325 MG per tablet 1 tablet (has no administration in time range)    ED Course/ Medical Decision Making/ A&P Clinical Course as of 06/23/22 2331  Wed Jun 23, 5136  [id number] 67 year old male with multiple Medical comorbidities who presented after trip and fall with no loss of consciousness and no preceding symptoms.  He had resulting Lisfranc fracture of the right foot.  I discussed case at length with Dr. Marcelino Scot who said that his swelling is very significant so may  limit immediate operative intervention but will likely eventually require operative intervention.  He is also high risk for DVT and will need to start Lovenox 40 mg daily.  If he is still having difficulties with pain control or ambulating will likely require admission for PT/OT, continue pain control, DVT prophylaxis and they can evaluate inpatient tomorrow.  Otherwise, if pain controlled and feels like he is able to go home, would have close outpatient follow-up with orthopedics, posterior splint placed, nonweightbearing status, continued RICE therapy, pain meds and Lovenox DVT prophylaxis. [VB]    Clinical Course User Index [VB] Elgie Congo, MD                           Medical Decision Making Amount and/or Complexity of Data Reviewed Labs: ordered. Radiology: ordered.  Risk Prescription drug management.   This patient presents to the ED for concern of mechanical fall earlier today.   Co morbidities that complicate the patient evaluation  Hx CVA, CHF, CKD, PE   Additional history obtained:  Additional history obtained from epic chart review   Lab Tests:  I Ordered, and personally interpreted labs.  The pertinent results include:  labs pending at shift change   Imaging Studies ordered:  I ordered imaging studies including CT head, CT cervical spine, dg right foot, CT right foot  I independently visualized and interpreted imaging which showed  DG right foot: Comminuted undisplaced fracture is seen in the proximal phalanx of right big toe. Comminuted displaced fractures are seen in the distal portions of right second and third metatarsals. Comminuted fractures are seen in bases of first and second metatarsals. Possibility of Lisfranc type injury should be considered. Follow-up CT as clinically warranted may be considered. CT right foot: 1. Multiple metatarsal and midfoot fractures including the first through fourth proximal metatarsals, the second and third metatarsal  fractures are segmental with additional fractures of the distal metatarsal necks. 2. Fractures involve the Lisfranc ligament insertion which is poorly defined and likely torn. 3. Tiny fracture from the lateral cuneiform. 4. Comminuted first proximal phalanx fracture.  CT head and cervical spine: no  acute findings I agree with the radiologist interpretation   Cardiac Monitoring: / EKG:  The patient was maintained on a cardiac monitor.  I personally viewed and interpreted the cardiac monitored which showed an underlying rhythm of: Qtc 548, no STEMI   Consultations Obtained:  I requested consultation with the orthopedics Dr. Marcelino Scot,  and discussed lab and imaging findings as well as pertinent plan - they recommend: DVT prophylaxis given his high risk for same.  Antibiotics given his abrasion with concern for open fracture.  Splinting and nonweightbearing.  Admit if unable to tolerate pain or non-weightbearing status. They will see the patient if plan for admission   Problem List / ED Course / Critical interventions / Medication management  I ordered medication including percocet and dilaudid for pain  Reevaluation of the patient after these medicines showed that the patient improved I have reviewed the patients home medicines and have made adjustments as needed   Test / Admission - Considered:  Patient presents today with complaints of mechanical fall earlier today.  He is afebrile, nontoxic-appearing, and in no acute distress with reassuring vital signs.  CT imaging does reveal Lisfranc injury.  Discussed with orthopedics and given his age and comorbid conditions, plan for DVT prophylaxis.  Plan to splint and be nonweightbearing until he can follow-up with orthopedics.  I discussed this plan with the patient who given his age and comorbid conditions does not feel safe to return home tonight.  He also states that his pain is being poorly managed with p.o. medication.  Given this, will admit  for pain control and PT/OT eval.  Patient is understanding and amenable with this plan. Laboratory evaluation pending at shift change.  After this results, patient will need hospitalist admission.   This is a shared visit with supervising physician Dr. Nechama Guard who has independently evaluated patient & provided guidance in evaluation/management/disposition, in agreement with care   Care handoff to Dr. Nechama Guard at shift change.  Please see their note for further evaluation and dispo.   Final Clinical Impression(s) / ED Diagnoses Final diagnoses:  Fall, initial encounter  Dislocation of tarsometatarsal joint of right foot, initial encounter    Rx / DC Orders ED Discharge Orders     None         Nestor Lewandowsky 06/23/22 2347    Elgie Congo, MD 06/24/22 [phone]

## 2022-06-24 ENCOUNTER — Encounter (HOSPITAL_COMMUNITY): Payer: Self-pay | Admitting: Internal Medicine

## 2022-06-24 DIAGNOSIS — W19XXXA Unspecified fall, initial encounter: Secondary | ICD-10-CM | POA: Diagnosis not present

## 2022-06-24 DIAGNOSIS — Z79899 Other long term (current) drug therapy: Secondary | ICD-10-CM | POA: Diagnosis not present

## 2022-06-24 DIAGNOSIS — I5032 Chronic diastolic (congestive) heart failure: Secondary | ICD-10-CM | POA: Diagnosis not present

## 2022-06-24 DIAGNOSIS — E785 Hyperlipidemia, unspecified: Secondary | ICD-10-CM | POA: Diagnosis present

## 2022-06-24 DIAGNOSIS — E876 Hypokalemia: Secondary | ICD-10-CM | POA: Diagnosis not present

## 2022-06-24 DIAGNOSIS — S0990XA Unspecified injury of head, initial encounter: Secondary | ICD-10-CM | POA: Diagnosis not present

## 2022-06-24 DIAGNOSIS — Z7982 Long term (current) use of aspirin: Secondary | ICD-10-CM | POA: Diagnosis not present

## 2022-06-24 DIAGNOSIS — S92314A Nondisplaced fracture of first metatarsal bone, right foot, initial encounter for closed fracture: Secondary | ICD-10-CM | POA: Diagnosis not present

## 2022-06-24 DIAGNOSIS — Y92002 Bathroom of unspecified non-institutional (private) residence single-family (private) house as the place of occurrence of the external cause: Secondary | ICD-10-CM | POA: Diagnosis not present

## 2022-06-24 DIAGNOSIS — I2699 Other pulmonary embolism without acute cor pulmonale: Secondary | ICD-10-CM | POA: Diagnosis not present

## 2022-06-24 DIAGNOSIS — Z86711 Personal history of pulmonary embolism: Secondary | ICD-10-CM | POA: Diagnosis not present

## 2022-06-24 DIAGNOSIS — N179 Acute kidney failure, unspecified: Secondary | ICD-10-CM | POA: Diagnosis not present

## 2022-06-24 DIAGNOSIS — Y92009 Unspecified place in unspecified non-institutional (private) residence as the place of occurrence of the external cause: Secondary | ICD-10-CM

## 2022-06-24 DIAGNOSIS — Z8673 Personal history of transient ischemic attack (TIA), and cerebral infarction without residual deficits: Secondary | ICD-10-CM | POA: Diagnosis not present

## 2022-06-24 DIAGNOSIS — Z87891 Personal history of nicotine dependence: Secondary | ICD-10-CM | POA: Diagnosis not present

## 2022-06-24 DIAGNOSIS — S93324A Dislocation of tarsometatarsal joint of right foot, initial encounter: Secondary | ICD-10-CM | POA: Diagnosis not present

## 2022-06-24 DIAGNOSIS — Z7984 Long term (current) use of oral hypoglycemic drugs: Secondary | ICD-10-CM | POA: Diagnosis not present

## 2022-06-24 DIAGNOSIS — N1832 Chronic kidney disease, stage 3b: Secondary | ICD-10-CM | POA: Diagnosis not present

## 2022-06-24 DIAGNOSIS — I130 Hypertensive heart and chronic kidney disease with heart failure and stage 1 through stage 4 chronic kidney disease, or unspecified chronic kidney disease: Secondary | ICD-10-CM | POA: Diagnosis not present

## 2022-06-24 DIAGNOSIS — W010XXA Fall on same level from slipping, tripping and stumbling without subsequent striking against object, initial encounter: Secondary | ICD-10-CM | POA: Diagnosis not present

## 2022-06-24 DIAGNOSIS — S99921A Unspecified injury of right foot, initial encounter: Secondary | ICD-10-CM | POA: Diagnosis present

## 2022-06-24 DIAGNOSIS — I5041 Acute combined systolic (congestive) and diastolic (congestive) heart failure: Secondary | ICD-10-CM | POA: Diagnosis not present

## 2022-06-24 LAB — CBC WITH DIFFERENTIAL/PLATELET
Abs Immature Granulocytes: 0.03 10*3/uL (ref 0.00–0.07)
Basophils Absolute: 0 10*3/uL (ref 0.0–0.1)
Basophils Relative: 1 %
Eosinophils Absolute: 0.5 10*3/uL (ref 0.0–0.5)
Eosinophils Relative: 7 %
HCT: 35.6 % — ABNORMAL LOW (ref 39.0–52.0)
Hemoglobin: 12.1 g/dL — ABNORMAL LOW (ref 13.0–17.0)
Immature Granulocytes: 0 %
Lymphocytes Relative: 13 %
Lymphs Abs: 1.1 10*3/uL (ref 0.7–4.0)
MCH: 31.2 pg (ref 26.0–34.0)
MCHC: 34 g/dL (ref 30.0–36.0)
MCV: 91.8 fL (ref 80.0–100.0)
Monocytes Absolute: 0.9 10*3/uL (ref 0.1–1.0)
Monocytes Relative: 11 %
Neutro Abs: 5.6 10*3/uL (ref 1.7–7.7)
Neutrophils Relative %: 68 %
Platelets: 183 10*3/uL (ref 150–400)
RBC: 3.88 MIL/uL — ABNORMAL LOW (ref 4.22–5.81)
RDW: 13.8 % (ref 11.5–15.5)
WBC: 8.1 10*3/uL (ref 4.0–10.5)
nRBC: 0 % (ref 0.0–0.2)

## 2022-06-24 LAB — CBC
HCT: 36.5 % — ABNORMAL LOW (ref 39.0–52.0)
Hemoglobin: 12.6 g/dL — ABNORMAL LOW (ref 13.0–17.0)
MCH: 31.4 pg (ref 26.0–34.0)
MCHC: 34.5 g/dL (ref 30.0–36.0)
MCV: 91 fL (ref 80.0–100.0)
Platelets: 165 10*3/uL (ref 150–400)
RBC: 4.01 MIL/uL — ABNORMAL LOW (ref 4.22–5.81)
RDW: 13.9 % (ref 11.5–15.5)
WBC: 8 10*3/uL (ref 4.0–10.5)
nRBC: 0 % (ref 0.0–0.2)

## 2022-06-24 LAB — COMPREHENSIVE METABOLIC PANEL
ALT: 26 U/L (ref 0–44)
AST: 61 U/L — ABNORMAL HIGH (ref 15–41)
Albumin: 3.3 g/dL — ABNORMAL LOW (ref 3.5–5.0)
Alkaline Phosphatase: 66 U/L (ref 38–126)
Anion gap: 9 (ref 5–15)
BUN: 28 mg/dL — ABNORMAL HIGH (ref 8–23)
CO2: 27 mmol/L (ref 22–32)
Calcium: 9 mg/dL (ref 8.9–10.3)
Chloride: 102 mmol/L (ref 98–111)
Creatinine, Ser: 2.36 mg/dL — ABNORMAL HIGH (ref 0.61–1.24)
GFR, Estimated: 30 mL/min — ABNORMAL LOW (ref >60)
Glucose, Bld: 91 mg/dL (ref 70–99)
Potassium: 3.1 mmol/L — ABNORMAL LOW (ref 3.5–5.1)
Sodium: 138 mmol/L (ref 135–145)
Total Bilirubin: 0.8 mg/dL (ref 0.3–1.2)
Total Protein: 5.8 g/dL — ABNORMAL LOW (ref 6.5–8.1)

## 2022-06-24 LAB — PROTIME-INR
INR: 1.1 (ref 0.8–1.2)
Prothrombin Time: 13.8 seconds (ref 11.4–15.2)

## 2022-06-24 LAB — APTT: aPTT: 25 seconds (ref 24–36)

## 2022-06-24 LAB — BASIC METABOLIC PANEL
Anion gap: 8 (ref 5–15)
BUN: 31 mg/dL — ABNORMAL HIGH (ref 8–23)
CO2: 30 mmol/L (ref 22–32)
Calcium: 9.3 mg/dL (ref 8.9–10.3)
Chloride: 101 mmol/L (ref 98–111)
Creatinine, Ser: 2.35 mg/dL — ABNORMAL HIGH (ref 0.61–1.24)
GFR, Estimated: 30 mL/min — ABNORMAL LOW (ref >60)
Glucose, Bld: 94 mg/dL (ref 70–99)
Potassium: 3.4 mmol/L — ABNORMAL LOW (ref 3.5–5.1)
Sodium: 139 mmol/L (ref 135–145)

## 2022-06-24 LAB — BRAIN NATRIURETIC PEPTIDE: B Natriuretic Peptide: 263.9 pg/mL — ABNORMAL HIGH (ref 0.0–100.0)

## 2022-06-24 LAB — GLUCOSE, CAPILLARY: Glucose-Capillary: 173 mg/dL — ABNORMAL HIGH (ref 70–99)

## 2022-06-24 LAB — MAGNESIUM: Magnesium: 2.2 mg/dL (ref 1.7–2.4)

## 2022-06-24 MED ORDER — POLYETHYLENE GLYCOL 3350 17 G PO PACK
17.0000 g | PACK | Freq: Two times a day (BID) | ORAL | Status: AC
  Administered 2022-06-24 – 2022-06-25 (×4): 17 g via ORAL
  Filled 2022-06-24 (×4): qty 1

## 2022-06-24 MED ORDER — CYCLOBENZAPRINE HCL 5 MG PO TABS
5.0000 mg | ORAL_TABLET | Freq: Three times a day (TID) | ORAL | Status: DC | PRN
  Administered 2022-06-26 – 2022-06-28 (×4): 5 mg via ORAL
  Filled 2022-06-24 (×4): qty 1

## 2022-06-24 MED ORDER — OXYCODONE-ACETAMINOPHEN 7.5-325 MG PO TABS
1.0000 | ORAL_TABLET | Freq: Four times a day (QID) | ORAL | Status: DC | PRN
  Administered 2022-06-24: 1 via ORAL
  Administered 2022-06-25: 2 via ORAL
  Filled 2022-06-24: qty 2
  Filled 2022-06-24: qty 1
  Filled 2022-06-24: qty 2

## 2022-06-24 MED ORDER — ACETAMINOPHEN 325 MG PO TABS
650.0000 mg | ORAL_TABLET | Freq: Two times a day (BID) | ORAL | Status: DC
  Administered 2022-06-24 – 2022-06-25 (×2): 650 mg via ORAL
  Filled 2022-06-24 (×2): qty 2

## 2022-06-24 MED ORDER — ACETAMINOPHEN 325 MG PO TABS
650.0000 mg | ORAL_TABLET | Freq: Four times a day (QID) | ORAL | Status: DC | PRN
  Administered 2022-06-24 – 2022-06-28 (×8): 650 mg via ORAL
  Filled 2022-06-24 (×9): qty 2

## 2022-06-24 MED ORDER — HYDROMORPHONE HCL 1 MG/ML IJ SOLN
1.0000 mg | INTRAMUSCULAR | Status: DC | PRN
  Filled 2022-06-24: qty 1

## 2022-06-24 MED ORDER — NALOXONE HCL 0.4 MG/ML IJ SOLN: 0.4000 mg | INTRAMUSCULAR | Status: DC | PRN

## 2022-06-24 MED ORDER — HYDROMORPHONE HCL 1 MG/ML IJ SOLN
1.0000 mg | INTRAMUSCULAR | Status: DC | PRN
  Administered 2022-06-24 – 2022-06-26 (×5): 1 mg via INTRAVENOUS
  Filled 2022-06-24 (×4): qty 1

## 2022-06-24 MED ORDER — FENTANYL CITRATE PF 50 MCG/ML IJ SOSY
50.0000 ug | PREFILLED_SYRINGE | INTRAMUSCULAR | Status: DC | PRN
  Administered 2022-06-24: 50 ug via INTRAVENOUS
  Filled 2022-06-24: qty 1

## 2022-06-24 MED ORDER — ACETAMINOPHEN 650 MG RE SUPP: 650.0000 mg | Freq: Four times a day (QID) | RECTAL | Status: DC | PRN

## 2022-06-24 MED ORDER — POTASSIUM CHLORIDE CRYS ER 20 MEQ PO TBCR
40.0000 meq | EXTENDED_RELEASE_TABLET | Freq: Two times a day (BID) | ORAL | Status: AC
  Administered 2022-06-24 (×2): 40 meq via ORAL
  Filled 2022-06-24 (×2): qty 2

## 2022-06-24 MED ORDER — POTASSIUM CHLORIDE CRYS ER 20 MEQ PO TBCR
40.0000 meq | EXTENDED_RELEASE_TABLET | Freq: Once | ORAL | Status: AC
  Administered 2022-06-24: 40 meq via ORAL
  Filled 2022-06-24: qty 2

## 2022-06-24 MED ORDER — MORPHINE SULFATE (PF) 4 MG/ML IV SOLN
4.0000 mg | INTRAVENOUS | Status: DC | PRN
  Administered 2022-06-24: 4 mg via INTRAVENOUS
  Filled 2022-06-24: qty 1

## 2022-06-24 MED ORDER — ATORVASTATIN CALCIUM 10 MG PO TABS
20.0000 mg | ORAL_TABLET | Freq: Every day | ORAL | Status: DC
  Administered 2022-06-24 – 2022-06-29 (×6): 20 mg via ORAL
  Filled 2022-06-24 (×6): qty 2

## 2022-06-24 NOTE — H&P (Signed)
History and Physical    PLEASE NOTE THAT DRAGON DICTATION SOFTWARE WAS USED IN THE CONSTRUCTION OF THIS NOTE.   [name]:[id number] DOB: Mar 12, 1956 DOA: 06/23/2022  PCP: [name], MD  Patient coming from: home   I have personally briefly reviewed patient's old medical records in [place]  Chief Complaint: Right foot pain  HPI: [name] is a 66 y.o. 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  The patient reports acute onset of right midfoot discomfort immediately following a ground-level mechanical fall earlier on 06/23/2022, which occurred as the patient was tripping while attempting to ambulate at home.  Did not hit his head as competitive this fall, and there is no associated loss of consciousness.  No recent chest pain, palpitations, diaphoresis, shortness of breath, nausea or vomiting.  Denies any recent or associated dizziness,, presyncope, or syncope.  On daily baby aspirin as an outpatient.  Otherwise, denies any additional acute arthralgias or myalgias.  Has documented history of chronic diastolic heart failure, with most recent complete echocardiogram performed in July 2022, which was notable for LVEF 60 to 65% as well as grade 1 diastolic dysfunction, also noting normal right ventricular systolic function.  Appears to be on torsemide as an outpatient.  His medical history is also notable for stage IIIb CKD with baseline creatinine range 1.7-2.0, most recent prior creatinine data point noted to be 1.66 on 02/23/2022.     [facility] ED Course:  Vital signs in the ED were notable for the following: Afebrile; heart rate in the 73A to  19F; systolic blood pressures 790W to 140s; respiratory rate 16-18, oxygen saturation 95 to 97% on room air.  Labs were notable for the following: BMP notable for potassium 3.4, bicarbonate 30, creatinine 2.35.  CBC notable for white cell count 8000.  INR 1.1.  Imaging and additional notable ED work-up: EKG, the interpretation of which was limited by the presence of motion artifact, however, within these confines, appears to show sinus rhythm with heart rate 53, without any evidence of ST changes, including no evidence of ST elevation.  CT of the right foot showed evidence of Lisfranc fracture.  Noncontrast CTh no evidence of acute intracranial process, including no evidence of intracranial hemorrhage or any evidence of acute infarct.  CT cervical spine showed no evidence of acute osseous abnormality or subluxation.  Max ED presentation consistent with on-call orthopedic surgeon, Dr. Marcelino Scot, Recommended admission to hospitalist for optimization of pain control relating to right-sided Lisfranc fracture, and conveyed that he will formally consult and see the patient in the morning.  While he notes that the patient will likely ultimately require surgical intervention, he suspects that acute patient will private presented to have surgical intervention over the next day, and consequently recommends initiation of DVT prophylaxis.   While in the ED, the following were administered: Lovenox 40 mg subcu x1 dose, Dilaudid 1 mg IV x1, Percocet 5/325 mg p.o. x1 dose.  Subsequently, the patient was admitted for overnight observation to [place] at [facility] for further evaluation and management, including pain control relating to Lisfranc fracture of the right foot following ground-level mechanical fall, with presentation also notable for AKI superimposed on CKD 3B as well as mild  hypokalemia.    Review of Systems: As per HPI otherwise 10 point review of systems negative.   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   ED (erectile dysfunction)    GERD (gastroesophageal reflux disease)    Hypercalcemia    Hyperlipidemia    Hypertension    Insomnia    Long-term use of high-risk medication    Nephrocalcinosis    Nephrolithiasis    Obesity    Osteopenia    Pancreatitis    admitted 03/19/01-06/05/01   PE (pulmonary embolism) 01/30/2013   Proteinuria    Sarcoidosis    Sleep apnea    Smoker    Stroke [facility])     Past Surgical History:  Procedure Laterality Date   ABDOMINAL EXPLORATION SURGERY     BACK SURGERY     CHOLECYSTECTOMY     RIGHT/LEFT HEART CATH AND CORONARY ANGIOGRAPHY N/A 05/12/2021   Procedure: RIGHT/LEFT HEART CATH AND CORONARY ANGIOGRAPHY;  Surgeon: [name], MD;  Location: [place] [facility];  Service: Cardiovascular;  Laterality: N/A;   SHOULDER ARTHROSCOPY Right 10/30/2020   Procedure: ARTHROSCOPY SHOULDER WITH EXTENSIVE DEBRIDEMENT;  Surgeon: Mcarthur Rossetti, MD;  Location: [facility];  Service: Orthopedics;  Laterality: Right;   SHOULDER SURGERY     TRACHEOSTOMY     closed    Social History:  reports that he quit smoking about 15 months ago. His smoking use included cigarettes. He started smoking about 16 years ago. He has a 3.75 pack-year smoking history. He has never used smokeless tobacco. He reports that he does not drink alcohol and does not use drugs.   Allergies  Allergen Reactions   Codeine Nausea Only   Hydrochlorothiazide     Unknown   Hydrocodone-Acetaminophen Nausea And Vomiting   Hydromorphone Other (See Comments)    hallucinations     Family History  Problem Relation Age of Onset   Hypertension Father    CVA Father    Lung cancer Father    Alzheimer's disease Mother     Hypertension Sister    Heart failure Sister     Family history reviewed and not pertinent    Prior to Admission medications   Medication Sig Start Date End Date Taking? Authorizing Provider  aspirin 81 MG tablet Take 1 tablet (81 mg total) by mouth daily. 02/01/13   [name], MD  atorvastatin (LIPITOR) 20 MG tablet Take 20 mg by mouth daily.    [provider]  Cholecalciferol (VITAMIN D) 125 MCG (5000 UT) CAPS Take 1 capsule by mouth daily.    [provider]  dapagliflozin propanediol (FARXIGA) 10 MG TABS tablet Take 1 tablet (10 mg total) by mouth daily. 05/24/22 08/22/22  Custovic, Collene Mares, DO  diazepam (VALIUM) 5 MG tablet Take 1-2 tabs 60 minutes before procedure as needed (if having injection in office then take at office. do not take prior to arrival) 06/11/22     diltiazem (TIAZAC) 360 MG 24 hr capsule TAKE ONE CAPSULE BY MOUTH EVERY DAY 10/22/21     fluticasone (FLONASE) 50 MCG/ACT nasal spray Place 2 sprays into both nostrils daily as needed for allergies or rhinitis.    [provider]  guaifenesin (HUMIBID E) 400 MG TABS tablet Take 400 mg by mouth every 4 (four) hours.    [provider]  hydrALAZINE (APRESOLINE) 100 MG tablet Take 1 tablet (100 mg total) by mouth 3 (three) times daily. 03/28/22     isosorbide mononitrate (IMDUR) 120 MG 24 hr tablet Take 1 tablet (120 mg total) by mouth daily. 01/24/22   Shelly Coss, MD  lidocaine (LIDODERM) 5 % Apply 1 patch to skin every day (May wear up to 12hours.) 02/17/22     metoprolol (TOPROL-XL) 200 MG 24 hr tablet Take 1 tablet (200 mg total) by mouth daily. 06/20/22     nicotine (NICOTINE STEP 1) 21 mg/24hr patch Place 1 patch (21 mg total) onto the skin daily. 05/20/22   Custovic, Collene Mares, DO  nicotine polacrilex (COMMIT) 4 MG lozenge Take 1 lozenge (4 mg total) by mouth as needed for up to 90 doses for smoking cessation. 02/05/22   Tolia, Sunit, DO  potassium chloride SA (KLOR-CON M) 20 MEQ tablet Take  1 tablet by mouth daily. 05/07/22   Tolia, Sunit, DO  Probiotic Product (PROBIOTIC DIGESTIVE SUPP) CAPS Take 1 capsule by mouth daily.    [provider]  sacubitril-valsartan (ENTRESTO) 97-103 MG Take 1 tablet by mouth 2 (two) times daily. 03/29/22   Cantwell, Celeste C, PA-C  tamsulosin (FLOMAX) 0.4 MG CAPS capsule Take 1 capsule by mouth daily - need appointment for more refills 02/02/22     torsemide (DEMADEX) 20 MG tablet Take 1 tablet (20 mg total) by mouth daily. 05/07/22   Tolia, Sunit, DO  traZODone (DESYREL) 150 MG tablet Take 1 tablet (150 mg total) by mouth at bedtime as needed for sleep. 12/22/20   Dohmeier, Asencion Partridge, MD  triamcinolone cream (KENALOG) 0.1 % Apply to affected areas twice a day as needed for itching/inflammation. 09/09/21        Objective    Physical Exam: Vitals:   06/23/22 2115 06/23/22 2230 06/24/22 0045 06/24/22 0129  BP: 138/80 (!) 149/106 139/85 (!) 147/73  Pulse: (!) 53 61 62 (!) 56  Resp:  '16 18 18  '$ Temp:   99.1 F (37.3 C) 98.3 F (36.8 C)  TempSrc:   Oral Oral  SpO2: 100% 92% 95% 95%    General: appears to be stated age; alert, oriented Skin: warm, dry, no rash Head:  AT/[place] Mouth:  Oral mucosa membranes appear moist, normal dentition Neck: supple; trachea midline Heart:  RRR; did not appreciate any M/R/G Lungs: CTAB, did not appreciate any wheezes, rales, or rhonchi Abdomen: + BS; soft, ND, NT Vascular: 2+ pedal pulses b/l; 2+ radial pulses b/l Extremities: no peripheral edema, no muscle wasting; right posterior splint noted. Neuro: strength and sensation intact in upper and lower extremities b/l   Labs on Admission: I have personally reviewed following labs and imaging studies  CBC: Recent Labs  Lab 06/24/22 0022  WBC 8.0  HGB 12.6*  HCT 36.5*  MCV 91.0  PLT 329   Basic Metabolic Panel: Recent Labs  Lab 06/24/22 0022  NA 139  K 3.4*  CL 101  CO2 30  GLUCOSE 94  BUN 31*  CREATININE 2.35*  CALCIUM 9.3   GFR: CrCl  cannot be calculated (Unknown ideal weight.). Liver Function Tests: No results for input(s): "AST", "ALT", "ALKPHOS", "BILITOT", "PROT", "ALBUMIN" in the last 168 hours. No results for input(s): "LIPASE", "AMYLASE" in the last 168 hours. No results for input(s): "AMMONIA" in the last 168 hours. Coagulation Profile: Recent Labs  Lab  06/24/22 0022  INR 1.1   Cardiac Enzymes: No results for input(s): "CKTOTAL", "CKMB", "CKMBINDEX", "TROPONINI" in the last 168 hours. BNP (last 3 results) Recent Labs    09/21/21 0720 02/09/22 0713 02/23/22 0709  PROBNP 1,795* 2,662* 2,247*   HbA1C: No results for input(s): "HGBA1C" in the last 72 hours. CBG: No results for input(s): "GLUCAP" in the last 168 hours. Lipid Profile: No results for input(s): "CHOL", "HDL", "LDLCALC", "TRIG", "CHOLHDL", "LDLDIRECT" in the last 72 hours. Thyroid Function Tests: No results for input(s): "TSH", "T4TOTAL", "FREET4", "T3FREE", "THYROIDAB" in the last 72 hours. Anemia Panel: No results for input(s): "VITAMINB12", "FOLATE", "FERRITIN", "TIBC", "IRON", "RETICCTPCT" in the last 72 hours. Urine analysis:    Component Value Date/Time   COLORURINE YELLOW 04/01/2021 2255   APPEARANCEUR CLEAR 04/01/2021 2255   LABSPEC 1.013 04/01/2021 2255   PHURINE 6.0 04/01/2021 2255   GLUCOSEU 150 (A) 04/01/2021 2255   HGBUR SMALL (A) 04/01/2021 2255   BILIRUBINUR NEGATIVE 04/01/2021 2255   KETONESUR NEGATIVE 04/01/2021 2255   PROTEINUR >=300 (A) 04/01/2021 2255   UROBILINOGEN 0.2 11/16/2012 1004   NITRITE NEGATIVE 04/01/2021 2255   LEUKOCYTESUR NEGATIVE 04/01/2021 2255    Radiological Exams on Admission: CT Foot Right Wo Contrast  Result Date: 06/23/2022 CLINICAL DATA:  Foot trauma, occult fracture suspected, xray equivocal (Age >= 6y) EXAM: CT OF THE RIGHT FOOT WITHOUT CONTRAST TECHNIQUE: Multidetector CT imaging of the right foot was performed according to the standard protocol. Multiplanar CT image reconstructions  were also generated. RADIATION DOSE REDUCTION: This exam was performed according to the departmental dose-optimization program which includes automated exposure control, adjustment of the mA and/or kV according to patient size and/or use of iterative reconstruction technique. COMPARISON:  Radiograph earlier today. FINDINGS: Bones/Joint/Cartilage Multiple metatarsal and midfoot fractures. The second and third metatarsal fractures are segmental. This includes a mildly comminuted and displaced fracture involving the base of the first metatarsal extending into the tarsal metatarsal joint. Comminuted fracture involving the base of the second metatarsal with mild displacement extending into the tarsal metatarsal joint. Fracture involves the region of the Lisfranc ligament insertion which is poorly defined. There is a displaced mildly comminuted fracture of the second metatarsal neck. Tiny fractures involving the anterolateral cuneiform and base of the third metatarsal. Displaced minimally comminuted distal third metatarsal neck fracture. Mildly comminuted intra-articular fourth proximal metatarsal fracture with intra-articular involvement. Nondisplaced mildly comminuted first proximal phalanx fracture. Ligaments The Lisfranc ligament is poorly defined. Muscles and Tendons Ill-defined musculature involving the intrinsic musculature of the foot likely represent intramuscular hemorrhage. There is no evidence of tendinous entrapment at the ankle. Soft tissues Prominent dorsal and lesser plantar soft tissue edema. IMPRESSION: 1. Multiple metatarsal and midfoot fractures including the first through fourth proximal metatarsals, the second and third metatarsal fractures are segmental with additional fractures of the distal metatarsal necks. 2. Fractures involve the Lisfranc ligament insertion which is poorly defined and likely torn. 3. Tiny fracture from the lateral cuneiform. 4. Comminuted first proximal phalanx fracture.  Electronically Signed   By: Keith Rake M.D.   On: 06/23/2022 21:09   CT Cervical Spine Wo Contrast  Result Date: 06/23/2022 CLINICAL DATA:  Status post trauma. EXAM: CT CERVICAL SPINE WITHOUT CONTRAST TECHNIQUE: Multidetector CT imaging of the cervical spine was performed without intravenous contrast. Multiplanar CT image reconstructions were also generated. RADIATION DOSE REDUCTION: This exam was performed according to the departmental dose-optimization program which includes automated exposure control, adjustment of the mA and/or kV according to patient  size and/or use of iterative reconstruction technique. COMPARISON:  None Available. FINDINGS: Alignment: There is reversal of the normal cervical spine lordosis. Skull base and vertebrae: No acute fracture. No primary bone lesion or focal pathologic process. Soft tissues and spinal canal: No prevertebral fluid or swelling. No visible canal hematoma. Disc levels: Moderate severity endplate sclerosis, marked severity anterior osteophyte formation and mild posterior bony spurring are seen at the levels of C3-C4, C4-C5, C5-C6 and C6-C7. Marked severity intervertebral disc space narrowing is seen at C3-C4, C5-C6 and C6-C7. Bilateral moderate severity multilevel facet joint hypertrophy is noted. Upper chest: Negative. Other: None. IMPRESSION: 1. No acute fracture or subluxation in the cervical spine. 2. Moderate to marked severity multilevel degenerative changes, as described above. Electronically Signed   By: Virgina Norfolk M.D.   On: 06/23/2022 20:43   CT Head Wo Contrast  Result Date: 06/23/2022 CLINICAL DATA:  Status post fall. EXAM: CT HEAD WITHOUT CONTRAST TECHNIQUE: Contiguous axial images were obtained from the base of the skull through the vertex without intravenous contrast. RADIATION DOSE REDUCTION: This exam was performed according to the departmental dose-optimization program which includes automated exposure control, adjustment of the mA  and/or kV according to patient size and/or use of iterative reconstruction technique. COMPARISON:  November 18, 2020 FINDINGS: Brain: There is mild cerebral atrophy with widening of the extra-axial spaces and ventricular dilatation. There are areas of decreased attenuation within the white matter tracts of the supratentorial brain, consistent with microvascular disease changes. Vascular: No hyperdense vessel or unexpected calcification. Skull: Normal. Negative for fracture or focal lesion. Sinuses/Orbits: Very mild bilateral ethmoid sinus mucosal thickening is noted. Other: None. IMPRESSION: 1. No acute intracranial abnormality. 2. Generalized cerebral atrophy and microvascular disease changes of the supratentorial brain. Electronically Signed   By: Virgina Norfolk M.D.   On: 06/23/2022 20:42   DG Foot Complete Right  Result Date: 06/23/2022 CLINICAL DATA:  Trauma, fall EXAM: RIGHT FOOT COMPLETE - 3+ VIEW COMPARISON:  None Available. FINDINGS: There is comminuted undisplaced fracture in the base and shaft of proximal phalanx of big toe. There is comminuted fracture in the necks of right second and third metatarsals with medial displacement of distal fracture fragments. There is a proximally 5 mm distraction of fracture fragments. There is faint lucency in the medial aspect of head of the fourth metacarpal seen in the AP view which may be a normal variation or suggest undisplaced fracture. There is comminuted fracture in the base of first metatarsal. Comminuted fracture is seen in the base of second metatarsal. As far as seen, there is no significant medial displacement of base of the first metatarsal or lateral displacement of bases of the third, fourth and fifth metatarsals. There is widening of space between bases of the first and second metatarsals. There is offset in alignment of distal articular surfaces of medial and middle cuneiform. There is marked soft tissue swelling over the dorsum of the foot.  IMPRESSION: Comminuted undisplaced fracture is seen in the proximal phalanx of right big toe. Comminuted displaced fractures are seen in the distal portions of right second and third metatarsals. Comminuted fractures are seen in bases of first and second metatarsals. Possibility of Lisfranc type injury should be considered. Follow-up CT as clinically warranted may be considered. Electronically Signed   By: Elmer Picker M.D.   On: 06/23/2022 19:52     EKG: Independently reviewed, with result as described above.    Assessment/Plan   Principal Problem:   Closed nondisplaced fracture  of first right metatarsal bone Active Problems:   Chronic diastolic CHF (congestive heart failure) (HCC)   Acute renal failure superimposed on stage 3b chronic kidney disease ([place])   Fall at home, initial encounter   Hypokalemia   HLD (hyperlipidemia)      #) Right Lisfranc fracture: Send from ground-level mechanical fall earlier today, confirmed per CT of the right foot."  Above, EDP [facility] discussed case/imaging with on-call orthopedic surgery, who will formally consult and see the patient in the morning, with interval recommendation for pain control as well as initiation of DVT prophylaxis given suspicion that acute inflammation will rib and pursuit of surgical intervention on 06/24/2022, as further detailed above.  Right lower extremity appears neurovascularly intact.  Plan: Orthopedic surgery consultation, as above.  Prn IV fentanyl.  Refraining from NSAIDs in the context of AKI on CKD 3B, as further detailed below.  All precautions ordered.  Physical therapy consult placed for the morning.        #) Ground-level mechanical fall: As consequence of tripping while attempting to ambulate at home, in the absence of any associated loss of consciousness.  Denies.  Patient is status committed this fall.  Of note, CT head/neck showed no evidence of acute process, as detailed above.  Will this appears  to be a purely mechanical process, will also evaluate for any contributory organic source, including evaluating for underlying UTI.  Plan: Fall precautions ordered.  Check urinalysis.  Physical therapy consult ordered for the morning.             #) Hypokalemia: Presenting potassium level mildly low at 3.4.  However, in the context of AKI superimposed on CKD 3B, will refrain from aggressive supplementation at this time.  Plan: Potassium chloride 40 mill colons p.o. x1 dose now.  Evidence of magnesium level.  CMP in the morning.             #) Acute Kidney Injury superimposed on CKD 3B: As further quantified above.  Expanding laboratory evaluation of such given unclear current etiology.   Plan: monitor strict I's & O's and daily weights. Attempt to avoid nephrotoxic agents. Refrain from NSAIDs. Repeat CMP in the morning. Check serum magnesium level.  Check urinalysis with microscopy.  Add-on random urine sodium and random urine creatinine.  Holding home torsemide pending further evaluation of volume status.  Add on BNP.  Holding [place] for now.               #) Chronic diastolic heart failure: documented history of such, with most recent echocardiogram performed  in July '22 demonstrating LVEF 66% as well as grade 1 diastolic dysfunction with additional details as conveyed above. No clinical evidence to suggest acutely decompensated heart failure at this time. home diuretic regimen reportedly consists of the following: Torsemide, will to be held following further volume  status assessment, as further detailed above.    Plan: monitor strict I's & O's and daily weights. Repeat CMP in AM. Check serum mag level.  Holding torsemide for now, as above.  On BNP.              #) Hyperlipidemia: documented h/o such. On atorvastatin as outpatient.    Plan: continue home statin.         DVT prophylaxis: Lovenox 40 mg subcu daily Code Status: Full  code Family Communication: none Disposition Plan: Per Rounding Team Consults called: EDP at [place] discussed patient's case/imaging with on-call orthopedic surgery, as further detailed above;  Admission status: Observation    PLEASE NOTE THAT DRAGON DICTATION SOFTWARE WAS USED IN THE CONSTRUCTION OF THIS NOTE.   [place] DO [facility]  From [place]   06/24/2022, 2:22 AM

## 2022-06-24 NOTE — Progress Notes (Addendum)
[facility] PROGRESS NOTE    Progress Note  [name]  ZHG:[id number] DOB: March 02, 1956 DOA: 06/23/2022 PCP: [name], MD     Brief Narrative:   [name] is an 66 y.o. male 66 year old with past medical history of chronic kidney disease stage IIIb with a baseline creatinine of 1.9-6.2, chronic diastolic heart failure, hyperlipidemia comes in to [facility] complaining of right foot pain after a mechanical fall, CT of the right foot showed multiple metatarsal and midfoot fracture including the fourth second and third, fracture involving the Lisfranc ligament insertion, orthopedic surgery was consulted.    Assessment/Plan:   Closed nondisplaced fracture of first right metatarsal bone: Orthopedic surgery was consulted continue fentanyl for pain. Avoid NSAIDs due to acute kidney injury. Still in pain, start IV Dilaudid, has not had a bowel movement in 3 days start on MiraLAX p.o. twice daily  Chronic kidney stage IIIb: With a baseline creatinine of 1.7-2.2, on admission 2.3 creatinine appears to be at baseline. He did not come in and acute kidney injury.  Mechanical fall: Physical therapy to take weightbearing after surgical intervention physical therapy has been consulted post orthopedic surgery recommendations.  Hypokalemia: 3.4 was given potassium orally and hydrated. We will continue replete potassium orally. Mag is 2.2.  Chronic diastolic CHF (congestive heart failure) (HCC) Appears euvolemic continue strict I's and O's and daily weights hold torsemide for today.  HLD (hyperlipidemia) Continue statins.    DVT prophylaxis: lovenox Family Communication:none Status is: Observation The patient remains OBS appropriate and will d/c before 2 midnights.    Code Status:     Code Status Orders  (From admission, onward)           Start     Ordered   06/24/22 0218  Full code  Continuous        06/24/22 0218           Code Status History      Date Active Date Inactive Code Status Order ID Comments User Context   01/21/2022 0710 01/23/2022 2126 Full Code [id number]  Vernelle Emerald, MD ED   05/12/2021 0900 05/12/2021 1645 Full Code [id number]  [name], MD Inpatient   04/17/2021 2251 04/23/2021 1846 Full Code [id number]  Chotiner, Yevonne Aline, MD ED   04/07/2021 1653 04/09/2021 1548 Full Code [id number]  Lavina Hamman, MD ED   03/22/2021 0955 03/24/2021 1721 Full Code [id number]  Karmen Bongo, MD ED   11/19/2020 0058 11/22/2020 1913 Full Code [id number]  Rise Patience, MD ED   03/14/2019 2047 03/15/2019 1804 Full Code [id number]  Vashti Hey, MD Inpatient         IV Access:   Peripheral IV   Procedures and diagnostic studies:   CT Foot Right Wo Contrast  Result Date: 06/23/2022 CLINICAL DATA:  Foot trauma, occult fracture suspected, xray equivocal (Age >= 6y) EXAM: CT OF THE RIGHT FOOT WITHOUT CONTRAST TECHNIQUE: Multidetector CT imaging of the right foot was performed according to the standard protocol. Multiplanar CT image reconstructions were also generated. RADIATION DOSE REDUCTION: This exam was performed according to the departmental dose-optimization program which includes automated exposure control, adjustment of the mA and/or kV according to patient size and/or use of iterative reconstruction technique. COMPARISON:  Radiograph earlier today. FINDINGS: Bones/Joint/Cartilage Multiple metatarsal and midfoot fractures. The second and third metatarsal fractures are segmental. This includes a mildly comminuted and displaced fracture involving the base of the first metatarsal extending into the tarsal metatarsal joint.  Comminuted fracture involving the base of the second metatarsal with mild displacement extending into the tarsal metatarsal joint. Fracture involves the region of the Lisfranc ligament insertion which is poorly defined. There is a displaced mildly comminuted fracture of the second metatarsal neck.  Tiny fractures involving the anterolateral cuneiform and base of the third metatarsal. Displaced minimally comminuted distal third metatarsal neck fracture. Mildly comminuted intra-articular fourth proximal metatarsal fracture with intra-articular involvement. Nondisplaced mildly comminuted first proximal phalanx fracture. Ligaments The Lisfranc ligament is poorly defined. Muscles and Tendons Ill-defined musculature involving the intrinsic musculature of the foot likely represent intramuscular hemorrhage. There is no evidence of tendinous entrapment at the ankle. Soft tissues Prominent dorsal and lesser plantar soft tissue edema. IMPRESSION: 1. Multiple metatarsal and midfoot fractures including the first through fourth proximal metatarsals, the second and third metatarsal fractures are segmental with additional fractures of the distal metatarsal necks. 2. Fractures involve the Lisfranc ligament insertion which is poorly defined and likely torn. 3. Tiny fracture from the lateral cuneiform. 4. Comminuted first proximal phalanx fracture. Electronically Signed   By: Keith Rake M.D.   On: 06/23/2022 21:09   CT Cervical Spine Wo Contrast  Result Date: 06/23/2022 CLINICAL DATA:  Status post trauma. EXAM: CT CERVICAL SPINE WITHOUT CONTRAST TECHNIQUE: Multidetector CT imaging of the cervical spine was performed without intravenous contrast. Multiplanar CT image reconstructions were also generated. RADIATION DOSE REDUCTION: This exam was performed according to the departmental dose-optimization program which includes automated exposure control, adjustment of the mA and/or kV according to patient size and/or use of iterative reconstruction technique. COMPARISON:  None Available. FINDINGS: Alignment: There is reversal of the normal cervical spine lordosis. Skull base and vertebrae: No acute fracture. No primary bone lesion or focal pathologic process. Soft tissues and spinal canal: No prevertebral fluid or swelling.  No visible canal hematoma. Disc levels: Moderate severity endplate sclerosis, marked severity anterior osteophyte formation and mild posterior bony spurring are seen at the levels of C3-C4, C4-C5, C5-C6 and C6-C7. Marked severity intervertebral disc space narrowing is seen at C3-C4, C5-C6 and C6-C7. Bilateral moderate severity multilevel facet joint hypertrophy is noted. Upper chest: Negative. Other: None. IMPRESSION: 1. No acute fracture or subluxation in the cervical spine. 2. Moderate to marked severity multilevel degenerative changes, as described above. Electronically Signed   By: Virgina Norfolk M.D.   On: 06/23/2022 20:43   CT Head Wo Contrast  Result Date: 06/23/2022 CLINICAL DATA:  Status post fall. EXAM: CT HEAD WITHOUT CONTRAST TECHNIQUE: Contiguous axial images were obtained from the base of the skull through the vertex without intravenous contrast. RADIATION DOSE REDUCTION: This exam was performed according to the departmental dose-optimization program which includes automated exposure control, adjustment of the mA and/or kV according to patient size and/or use of iterative reconstruction technique. COMPARISON:  November 18, 2020 FINDINGS: Brain: There is mild cerebral atrophy with widening of the extra-axial spaces and ventricular dilatation. There are areas of decreased attenuation within the white matter tracts of the supratentorial brain, consistent with microvascular disease changes. Vascular: No hyperdense vessel or unexpected calcification. Skull: Normal. Negative for fracture or focal lesion. Sinuses/Orbits: Very mild bilateral ethmoid sinus mucosal thickening is noted. Other: None. IMPRESSION: 1. No acute intracranial abnormality. 2. Generalized cerebral atrophy and microvascular disease changes of the supratentorial brain. Electronically Signed   By: Virgina Norfolk M.D.   On: 06/23/2022 20:42   DG Foot Complete Right  Result Date: 06/23/2022 CLINICAL DATA:  Trauma, fall EXAM: RIGHT  FOOT COMPLETE -  3+ VIEW COMPARISON:  None Available. FINDINGS: There is comminuted undisplaced fracture in the base and shaft of proximal phalanx of big toe. There is comminuted fracture in the necks of right second and third metatarsals with medial displacement of distal fracture fragments. There is a proximally 5 mm distraction of fracture fragments. There is faint lucency in the medial aspect of head of the fourth metacarpal seen in the AP view which may be a normal variation or suggest undisplaced fracture. There is comminuted fracture in the base of first metatarsal. Comminuted fracture is seen in the base of second metatarsal. As far as seen, there is no significant medial displacement of base of the first metatarsal or lateral displacement of bases of the third, fourth and fifth metatarsals. There is widening of space between bases of the first and second metatarsals. There is offset in alignment of distal articular surfaces of medial and middle cuneiform. There is marked soft tissue swelling over the dorsum of the foot. IMPRESSION: Comminuted undisplaced fracture is seen in the proximal phalanx of right big toe. Comminuted displaced fractures are seen in the distal portions of right second and third metatarsals. Comminuted fractures are seen in bases of first and second metatarsals. Possibility of Lisfranc type injury should be considered. Follow-up CT as clinically warranted may be considered. Electronically Signed   By: Elmer Picker M.D.   On: 06/23/2022 19:52     Medical Consultants:   None.   Subjective:    [name] he relates his pain is not controlled.  Objective:    Vitals:   06/24/22 0129 06/24/22 0300 06/24/22 0400 06/24/22 0812  BP: (!) 147/73 (!) 140/68  120/86  Pulse: (!) 56 60  66  Resp: '18 16  19  '$ Temp: 98.3 F (36.8 C) 98 F (36.7 C)  98.9 F (37.2 C)  TempSrc: Oral Oral    SpO2: 95% 97% 97% 91%   SpO2: 91 %   Intake/Output Summary (Last 24 hours)  at 06/24/2022 0854 Last data filed at 06/24/2022 0120 Gross per 24 hour  Intake 240 ml  Output 1560 ml  Net -1320 ml   There were no vitals filed for this visit.  Exam: General exam: In no acute distress. Respiratory system: Good air movement and clear to auscultation. Cardiovascular system: S1 & S2 heard, RRR. No JVD.  Gastrointestinal system: Abdomen is nondistended, soft and nontender.  Extremities: No pedal edema. Skin: No rashes, lesions or ulcers Psychiatry: Judgement and insight appear normal. Mood & affect appropriate.    Data Reviewed:    Labs: Basic Metabolic Panel: Recent Labs  Lab 06/24/22 0022 06/24/22 0250  NA 139 138  K 3.4* 3.1*  CL 101 102  CO2 30 27  GLUCOSE 94 91  BUN 31* 28*  CREATININE 2.35* 2.36*  CALCIUM 9.3 9.0  MG  --  2.2   GFR CrCl cannot be calculated (Unknown ideal weight.). Liver Function Tests: Recent Labs  Lab 06/24/22 0250  AST 61*  ALT 26  ALKPHOS 66  BILITOT 0.8  PROT 5.8*  ALBUMIN 3.3*   No results for input(s): "LIPASE", "AMYLASE" in the last 168 hours. No results for input(s): "AMMONIA" in the last 168 hours. Coagulation profile Recent Labs  Lab 06/24/22 0022  INR 1.1   COVID-19 Labs  No results for input(s): "DDIMER", "FERRITIN", "LDH", "CRP" in the last 72 hours.  Lab Results  Component Value Date   SARSCOV2NAA NEGATIVE 01/21/2022   SARSCOV2NAA NEGATIVE 04/17/2021   SARSCOV2NAA NEGATIVE  04/07/2021   [place] NEGATIVE 04/01/2021    CBC: Recent Labs  Lab 06/24/22 0022 06/24/22 0250  WBC 8.0 8.1  NEUTROABS  --  5.6  HGB 12.6* 12.1*  HCT 36.5* 35.6*  MCV 91.0 91.8  PLT 165 183   Cardiac Enzymes: No results for input(s): "CKTOTAL", "CKMB", "CKMBINDEX", "TROPONINI" in the last 168 hours. BNP (last 3 results) Recent Labs    09/21/21 0720 02/09/22 0713 02/23/22 0709  PROBNP 1,795* 2,662* 2,247*   CBG: Recent Labs  Lab 06/24/22 0807  GLUCAP 173*   D-Dimer: No results for input(s):  "DDIMER" in the last 72 hours. Hgb A1c: No results for input(s): "HGBA1C" in the last 72 hours. Lipid Profile: No results for input(s): "CHOL", "HDL", "LDLCALC", "TRIG", "CHOLHDL", "LDLDIRECT" in the last 72 hours. Thyroid function studies: No results for input(s): "TSH", "T4TOTAL", "T3FREE", "THYROIDAB" in the last 72 hours.  Invalid input(s): "FREET3" Anemia work up: No results for input(s): "VITAMINB12", "FOLATE", "FERRITIN", "TIBC", "IRON", "RETICCTPCT" in the last 72 hours. Sepsis Labs: Recent Labs  Lab 06/24/22 0022 06/24/22 0250  WBC 8.0 8.1   Microbiology No results found for this or any previous visit (from the past 240 hour(s)).   Medications:    atorvastatin  20 mg Oral Daily   enoxaparin (LOVENOX) injection  40 mg Subcutaneous Daily   Continuous Infusions:    LOS: 0 days   Charlynne Cousins  [facility]  06/24/2022, 8:54 AM

## 2022-06-24 NOTE — Care Management Obs Status (Signed)
[place] NOTIFICATION   Patient Details  Name: ROYALE SWAMY MRN: [id number] Date of Birth: [birth year]   Medicare Observation Status Notification Given:  Yes    Sharin Mons, RN 06/24/2022, 4:00 PM

## 2022-06-24 NOTE — TOC Initial Note (Signed)
Transition of Care [facility]) - Initial/Assessment Note    Patient Details  Name: [name] MRN: [id number] Date of Birth: [birth year]  Transition of Care [facility]) CM/SW Contact:    Sharin Mons, RN Phone Number: 06/24/2022, 4:11 PM  Clinical Narrative:     Presented s/p fall. Suffered  Lisfranc fracture of the right foot.      NCM spoke with pt @ bedside regarding d/c planning. Pt states resides with wife, daughter and 3 teenage grandchildren. PTA independent with ADL's , no DME usage.   Shared PT evaluation / recommendations for home health services. Pt agreeable. Offered choice. States with preference as long as provider is in network with insurance. Referral made with [facility]  and accepted pending MD's order. Face to face and home health orders will be needed from MD/ provider for home health services.  DME needs noted: Wheelchair;Wheelchair cushion ;Rolling walker (2 wheels);BSC/3in1. TOC to f/u with making referral closer to d/c day.  Plan: ? Surgery pending, ortho consulted  TOC team will continue to monitor and assist with needs.  Expected Discharge Plan: [place] Barriers to Discharge: Continued Medical Work up   Patient Goals and CMS Choice     Choice offered to / list presented to : Patient  Expected Discharge Plan and Services Expected Discharge Plan: [place]   Discharge Planning Services: CM Consult                               HH Arranged: PT [facility] Agency: [place] Date [place]: 06/24/22 Time HH Agency Contacted: [phone]    Prior Living Arrangements/Services   Lives with:: Spouse Patient language and need for interpreter reviewed:: Yes Do you feel safe going back to the place where you live?: Yes      Need for Family Participation in Patient Care: Yes (Comment) Care giver support system in place?: Yes (comment)   Criminal Activity/Legal Involvement Pertinent to  Current Situation/Hospitalization: No - Comment as needed  Activities of Daily Living      Permission Sought/Granted   Permission granted to share information with : Yes, Verbal Permission Granted  Share Information with NAME: 1           Emotional Assessment Appearance:: Appears stated age Attitude/Demeanor/Rapport: Engaged Affect (typically observed): Accepting Orientation: : Oriented to Self, Oriented to Place, Oriented to  Time, Oriented to Situation Alcohol / Substance Use: Not Applicable Psych Involvement: No (comment)  Admission diagnosis:  Closed nondisplaced fracture of first right metatarsal bone [S92.314A] Dislocation of tarsometatarsal joint of right foot, initial encounter [S93.324A] Fall, initial encounter [W19.XXXA] Patient Active Problem List   Diagnosis Date Noted   Fall at home, initial encounter 06/24/2022   Hypokalemia 06/24/2022   HLD (hyperlipidemia) 06/24/2022   Closed nondisplaced fracture of first right metatarsal bone 06/23/2022   Hypertensive emergency 01/21/2022   GERD without esophagitis 01/21/2022   Coronary artery disease involving native coronary artery of native heart without angina pectoris 01/21/2022   Acute respiratory failure with hypoxia ([place]) 01/21/2022   Intolerance of continuous positive airway pressure (CPAP) ventilation 08/24/2021   Right lower lobe lung mass 05/13/2021   (HFpEF) heart failure with preserved ejection fraction ([place]) 05/11/2021   AKI (acute kidney injury) ([place])    Prolonged QT interval 04/17/2021   COPD exacerbation (HCC)    Noncompliance    Chronic diastolic CHF (congestive heart failure) (  [place]) 03/22/2021   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   Chronic intermittent hypoxia with obstructive sleep apnea 12/22/2020   Mild cognitive impairment 12/22/2020   Acute combined systolic and diastolic congestive heart failure ([place]) 12/22/2020   Rotator cuff arthropathy, right 12/04/2020   Volume  overload 12/04/2020   Shortness of breath 12/04/2020   Acute renal failure superimposed on stage 3b chronic kidney disease ([place]) 11/19/2020   Acute respiratory failure ([place]) 11/19/2020   Bacteremia due to methicillin susceptible Staphylococcus aureus (MSSA) 11/19/2020   Cranial neuropathy    Acute encephalopathy 11/18/2020   Chronic right shoulder pain 06/18/2020   OSA (obstructive sleep apnea) 05/05/2020   Hypersomnia with sleep apnea 12/20/2019   Psychophysiological insomnia 09/18/2019   Nocturia more than twice per night 09/18/2019   Renal hypertension 09/18/2019   Moderate asthma 09/18/2019   Tobacco abuse 09/18/2019   Non-restorative sleep 09/18/2019   Nontraumatic complete tear of right rotator cuff 08/15/2019   Delirium 03/16/2019   Stage 3b chronic kidney disease    CVA (cerebral infarction)    Pseudarthrosis 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 02/28/2019   Intervertebral disc disorder with radiculopathy of lumbar region 09/13/2017   Acute gouty arthritis 03/07/2017   Arthralgia of left foot 03/07/2017   Nephrolithiasis    Fever, unspecified 11/02/2015   Essential hypertension 02/07/2015   Pulmonary embolism ([place]) 01/30/2013   Sarcoidosis 01/30/2013   Hemoptysis 01/30/2013   SHOULDER PAIN, LEFT 11/28/2009   PCP:  [name], MD Pharmacy:   [name] [address] Phone: [phone] Fax: [phone]     Social Determinants of Health (SDOH) Interventions    Readmission Risk Interventions    04/09/2021    9:45 AM  Readmission Risk Prevention Plan  Transportation Screening Complete  PCP or Specialist Appt within 3-5 Days Complete  HRI or [place] Complete  Social Work Consult for [place] Planning/Counseling Complete  Palliative Care Screening Not Applicable  Medication Review Press photographer) Complete

## 2022-06-24 NOTE — Progress Notes (Addendum)
PT Cancellation Note  Patient Details Name: [name] MRN: [id number] DOB: 10/15/1955   Cancelled Treatment:    Reason Eval/Treat Not Completed: Medical issues which prohibited therapy PT evaluation order acknowledged; originally held, awaiting ortho consult, however see in prior note [name], PA and Dr. Nechama Guard did discuss case with Dr. Marcelino Scot and has been admitted to floor. Will follow-up this morning for gait training with [facility] RLE.  Candie Mile, PT, DPT Physical Therapist Acute Rehabilitation Services [place] [facility]  06/24/2022, 11:13 AM

## 2022-06-24 NOTE — Plan of Care (Signed)

## 2022-06-24 NOTE — Progress Notes (Signed)
Physical Therapy Evaluation Patient Details Name: [name] MRN: [id number] DOB: 18-Mar-1956 Today's Date: 06/24/2022  History of Present Illness  66 y.o. male with past medical history of chronic kidney disease stage IIIb with a baseline creatinine of 8.1-0.1, chronic diastolic heart failure, hyperlipidemia, admitted 10/18 for fall resulting in resulting Lisfranc fracture of the right foot.  Clinical Impression  Pt admitted with above complications. Required up to mod assist for transfer using a rolling walker; not appropriate to use crutches at this time. Patient reports he can stay on the bottom floor of his home and avoid stairs. Will need to utilize a wheelchair for transfers and mobility as he will be alone during parts of the day. Pt states w/c will fit throughout bottom floor of house. Will follow up for w/c training; if unable to safely transfer or family unable to assist, may require short term placement until potential surgery. Pt currently with functional limitations due to the deficits listed below (see PT Problem List). Pt will benefit from skilled PT to increase their independence and safety with mobility to allow discharge to the venue listed below.          Recommendations for follow up therapy are one component of a multi-disciplinary discharge planning process, led by the attending physician.  Recommendations may be updated based on patient status, additional functional criteria and insurance authorization.  Follow Up Recommendations Home health PT      Assistance Recommended at Discharge Intermittent Supervision/Assistance  Patient can return home with the following  A little help with walking and/or transfers;A little help with bathing/dressing/bathroom;Assistance with cooking/housework;Assist for transportation;Help with stairs or ramp for entrance    Equipment Recommendations Wheelchair (measurements PT);Wheelchair cushion (measurements PT);Rolling walker (2  wheels);BSC/3in1  Recommendations for Other Services  OT consult    Functional Status Assessment Patient has had a recent decline in their functional status and demonstrates the ability to make significant improvements in function in a reasonable and predictable amount of time.     Precautions / Restrictions Precautions Precautions: Fall Restrictions Weight Bearing Restrictions: Yes RLE Weight Bearing: Non weight bearing      Mobility  Bed Mobility Overal bed mobility: Modified Independent             General bed mobility comments: extra time    Transfers Overall transfer level: Needs assistance Equipment used: Rolling walker (2 wheels) Transfers: Sit to/from Stand, Bed to chair/wheelchair/BSC Sit to Stand: Mod assist, From elevated surface   Step pivot transfers: Min assist       General transfer comment: Required up to mod assist with VC for technique and to maintain NWB on RLE. Use of RW for support upon standing. Not safe to try crutches at this time. Pt did not have the capacity to rise from low bed setting. Performed from elevated bed surface with mod assist, and min assist from recliner using arm rests to boost. Step pivot transfer to recliner with great effort. Educated on safe AD use, maintains NWB on RLE however needs min assist for RW control with turn.    Ambulation/Gait                  Stairs            Wheelchair Mobility    Modified Rankin (Stroke Patients Only)       Balance Overall balance assessment: Needs assistance Sitting-balance support: No upper extremity supported, Feet supported Sitting balance-Leahy Scale: Good     Standing balance support:  Bilateral upper extremity supported, Reliant on assistive device for balance Standing balance-Leahy Scale: Poor                               Pertinent Vitals/Pain Pain Assessment Pain Assessment: 0-10 Pain Score: 6  Pain Location: right foot Pain Descriptors /  Indicators: Aching Pain Intervention(s): Monitored during session, Repositioned, Premedicated before session, Limited activity within patient's tolerance    Home Living Family/patient expects to be discharged to:: Private residence Living Arrangements: Spouse/significant other;Children;Other relatives Available Help at Discharge: Family;Available 24 hours/day Type of Home: House Home Access: Level entry     Alternate Level Stairs-Number of Steps: flight Home Layout: Two level;Able to live on main level with bedroom/bathroom;Bed/bath upstairs (He can stay downstairs) Home Equipment: Rolling Walker (2 wheels);Crutches;BSC/3in1      Prior Function Prior Level of Function : Independent/Modified Independent                     Hand Dominance   Dominant Hand: Right    Extremity/Trunk Assessment   Upper Extremity Assessment Upper Extremity Assessment: Defer to OT evaluation    Lower Extremity Assessment Lower Extremity Assessment: Generalized weakness;RLE deficits/detail RLE: Unable to fully assess due to immobilization       Communication   Communication: No difficulties  Cognition Arousal/Alertness: Awake/alert Behavior During Therapy: WFL for tasks assessed/performed Overall Cognitive Status: Within Functional Limits for tasks assessed                                          General Comments      Exercises General Exercises - Lower Extremity Quad Sets: Strengthening, Both, 10 reps, Seated Gluteal Sets: Strengthening, Both, 10 reps, Seated Long Arc Quad: Strengthening, Both, 5 reps, Seated   Assessment/Plan    PT Assessment Patient needs continued PT services  PT Problem List Decreased strength;Decreased range of motion;Decreased activity tolerance;Decreased balance;Decreased mobility;Decreased knowledge of use of DME;Decreased knowledge of precautions;Pain       PT Treatment Interventions DME instruction;Gait training;Functional  mobility training;Therapeutic activities;Therapeutic exercise;Balance training;Neuromuscular re-education;Wheelchair mobility training;Patient/family education    PT Goals (Current goals can be found in the Care Plan section)  Acute Rehab PT Goals Patient Stated Goal: Go home PT Goal Formulation: With patient Time For Goal Achievement: 07/01/22 Potential to Achieve Goals: Good Additional Goals Additional Goal #1: Patient will demonstrate independent use with w/c including 2 turns >50 feet.    Frequency Min 3X/week     Co-evaluation               AM-PAC PT "6 Clicks" Mobility  Outcome Measure Help needed turning from your back to your side while in a flat bed without using bedrails?: None Help needed moving from lying on your back to sitting on the side of a flat bed without using bedrails?: None Help needed moving to and from a bed to a chair (including a wheelchair)?: A Lot Help needed standing up from a chair using your arms (e.g., wheelchair or bedside chair)?: A Lot Help needed to walk in hospital room?: A Lot Help needed climbing 3-5 steps with a railing? : Total 6 Click Score: 15    End of Session Equipment Utilized During Treatment: Gait belt Activity Tolerance: Patient tolerated treatment well Patient left: in chair;with call bell/phone within reach;with chair alarm set  Nurse Communication: Mobility status PT Visit Diagnosis: Unsteadiness on feet (R26.81);Other abnormalities of gait and mobility (R26.89);Muscle weakness (generalized) (M62.81);History of falling (Z91.81);Difficulty in walking, not elsewhere classified (R26.2);Pain Pain - Right/Left: Right Pain - part of body: Ankle and joints of foot    Time: 9688-6484 PT Time Calculation (min) (ACUTE ONLY): 29 min   Charges:   PT Evaluation $PT Eval Low Complexity: 1 Low PT Treatments $Therapeutic Activity: 8-22 mins        Candie Mile, PT, DPT Physical Therapist Acute Rehabilitation Services [place] 06/24/2022, 12:42 PM

## 2022-06-24 NOTE — Progress Notes (Signed)
Mobility Specialist Progress Note   06/24/22 1515  Mobility  Activity Dangled on edge of bed;Turned to back - supine  Level of Assistance Minimal assist, patient does 75% or more  Assistive Device Other (Comment) (HHA)  RLE Weight Bearing NWB  Activity Response Tolerated well  $Mobility charge 1 Mobility   Received pt partially on EOB setting bed alarm off. Pt trying to get fully to EOB to void in urinal. MinA to get RLE to EOB, successful void. MinA to get RLE back in bed d/t increased pain tolerance and weakness. Left supine w/ all needs met and bed alarm back on.     Mobility Specialist Acute Rehab Office:  [phone]  

## 2022-06-24 NOTE — Consult Note (Signed)
Orthopaedic Trauma Service (OTS) Consultation   Patient ID: [name] MRN: [id number] DOB/AGE: [birth year] 65 y.o.   Reason for Consult: Right Lisfranc fracture dislocation Referring Physician: Charlynne Cousins, MD  HPI: [name] is a 66 y.o. male who got his foot caught in mat and stumbled with severe right foot pain and deformity with wound on the medial side of the foot. Swelling severe and prevented surgery today. Admitted for failure to mobilize but did make some progress with PT today and reports reasonable support at home. Pain is currently not well controlled as just got back in bed from chair, aching and dull, sharp and severe with motion, without associated distal tingling or numbness, and was much better today before mobilizing.    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   ED (erectile dysfunction)    GERD (gastroesophageal reflux disease)    Hypercalcemia    Hyperlipidemia    Hypertension    Insomnia    Long-term use of high-risk medication    Nephrocalcinosis    Nephrolithiasis    Obesity    Osteopenia    Pancreatitis    admitted 03/19/01-06/05/01   PE (pulmonary embolism) 01/30/2013   Proteinuria    Sarcoidosis    Sleep apnea    Smoker    Stroke [facility])     Past Surgical History:  Procedure Laterality Date   ABDOMINAL EXPLORATION SURGERY     BACK SURGERY     CHOLECYSTECTOMY     RIGHT/LEFT HEART CATH AND CORONARY ANGIOGRAPHY N/A 05/12/2021   Procedure: RIGHT/LEFT HEART CATH AND CORONARY ANGIOGRAPHY;  Surgeon: [name], MD;  Location: [place] [facility];  Service: Cardiovascular;  Laterality: N/A;   SHOULDER ARTHROSCOPY Right 10/30/2020   Procedure: ARTHROSCOPY SHOULDER WITH EXTENSIVE  DEBRIDEMENT;  Surgeon: Mcarthur Rossetti, MD;  Location: [place];  Service: Orthopedics;  Laterality: Right;   SHOULDER SURGERY     TRACHEOSTOMY     closed    Family History  Problem Relation Age of Onset   Hypertension Father    CVA Father    Lung cancer Father    Alzheimer's disease Mother    Hypertension Sister    Heart failure Sister     Social History:  reports that he quit smoking about 15 months ago. His smoking use included cigarettes. He started smoking about 16 years ago. He has a 3.75 pack-year smoking history. He has never used smokeless tobacco. He reports that he does not drink alcohol and does not use drugs.  Allergies:  Allergies  Allergen Reactions   Codeine Nausea And Vomiting   Hydrochlorothiazide Other (See Comments)    Unknown per Pt.    Hydrocodone-Acetaminophen Nausea And Vomiting   Hydromorphone Other (See Comments)    hallucinations     Medications: Prior to Admission:  Medications Prior to Admission  Medication Sig Dispense Refill Last Dose   aspirin 81 MG tablet Take 1 tablet (81 mg total) by mouth daily.   06/22/2022   atorvastatin (LIPITOR) 20 MG tablet Take 20 mg by mouth daily.   06/22/2022   Cholecalciferol (VITAMIN D) 125 MCG (5000 UT) CAPS Take 5,000  Units by mouth daily.   06/22/2022   dapagliflozin propanediol (FARXIGA) 10 MG TABS tablet Take 1 tablet (10 mg total) by mouth daily. 90 tablet 3 06/22/2022   diltiazem (TIAZAC) 360 MG 24 hr capsule TAKE ONE CAPSULE BY MOUTH EVERY DAY 90 capsule 3 06/22/2022   fluticasone (FLONASE) 50 MCG/ACT nasal spray Place 1 spray into both nostrils at bedtime as needed for allergies or rhinitis.   06/22/2022   hydrALAZINE (APRESOLINE) 100 MG tablet Take 1 tablet (100 mg total) by mouth 3 (three) times daily. 270 tablet 1 06/22/2022   isosorbide mononitrate (IMDUR) 30 MG 24 hr tablet Take 30 mg by mouth in the morning and at bedtime.   06/22/2022   metoprolol (TOPROL-XL) 200 MG 24 hr  tablet Take 1 tablet (200 mg total) by mouth daily. 90 tablet 3 06/22/2022 at 0600   potassium chloride SA (KLOR-CON M) 20 MEQ tablet Take 1 tablet by mouth daily. 90 tablet 1 06/22/2022   Probiotic Product (PROBIOTIC DIGESTIVE SUPP) CAPS Take 1 capsule by mouth daily.   06/22/2022   sacubitril-valsartan (ENTRESTO) 97-103 MG Take 1 tablet by mouth 2 (two) times daily. 60 tablet 3 06/22/2022   tamsulosin (FLOMAX) 0.4 MG CAPS capsule Take 1 capsule by mouth daily - need appointment for more refills (Patient taking differently: Take 0.4 mg by mouth daily.) 30 capsule 6 06/23/2022   torsemide (DEMADEX) 20 MG tablet Take 1 tablet (20 mg total) by mouth daily. 90 tablet 1 06/22/2022   traZODone (DESYREL) 150 MG tablet Take 1 tablet (150 mg total) by mouth at bedtime as needed for sleep. (Patient taking differently: Take 150 mg by mouth at bedtime.) 90 tablet 3 06/22/2022   triamcinolone cream (KENALOG) 0.1 % Apply to affected areas twice a day as needed for itching/inflammation. (Patient taking differently: Apply 1 Application topically as needed (itching).) 80 g 2 06/22/2022   diazepam (VALIUM) 5 MG tablet Take 1-2 tabs 60 minutes before procedure as needed (if having injection in office then take at office. do not take prior to arrival) (Patient not taking: Reported on 06/24/2022) 2 tablet 0 Completed Course   guaifenesin (HUMIBID E) 400 MG TABS tablet Take 400 mg by mouth every 4 (four) hours. (Patient not taking: Reported on 06/24/2022)   Not Taking   isosorbide mononitrate (IMDUR) 120 MG 24 hr tablet Take 1 tablet (120 mg total) by mouth daily. (Patient not taking: Reported on 06/24/2022) 30 tablet 1 Not Taking   lidocaine (LIDODERM) 5 % Apply 1 patch to skin every day (May wear up to 12hours.) (Patient not taking: Reported on 06/24/2022) 30 patch 5 Not Taking   nicotine (NICOTINE STEP 1) 21 mg/24hr patch Place 1 patch (21 mg total) onto the skin daily. (Patient not taking: Reported on 06/24/2022) 28  patch 1 Not Taking   nicotine polacrilex (COMMIT) 4 MG lozenge Take 1 lozenge (4 mg total) by mouth as needed for up to 90 doses for smoking cessation. (Patient not taking: Reported on 06/24/2022) 100 tablet 0 Not Taking    Results for orders placed or performed during the hospital encounter of 06/23/22 (from the past 48 hour(s))  CBC     Status: Abnormal   Collection Time: 06/24/22 12:22 AM  Result Value Ref Range   WBC 8.0 4.0 - 10.5 K/uL   RBC 4.01 (L) 4.22 - 5.81 MIL/uL   Hemoglobin 12.6 (L) 13.0 - 17.0 g/dL   HCT 36.5 (L) 39.0 - 52.0 %   MCV 91.0 80.0 -  100.0 fL   MCH 31.4 26.0 - 34.0 pg   MCHC 34.5 30.0 - 36.0 g/dL   RDW 13.9 11.5 - 15.5 %   Platelets 165 150 - 400 K/uL   nRBC 0.0 0.0 - 0.2 %    Comment: Performed at [facility], [address], [place], [place] [address]  Basic metabolic panel     Status: Abnormal   Collection Time: 06/24/22 12:22 AM  Result Value Ref Range   Sodium 139 135 - 145 mmol/L   Potassium 3.4 (L) 3.5 - 5.1 mmol/L   Chloride 101 98 - 111 mmol/L   CO2 30 22 - 32 mmol/L   Glucose, Bld 94 70 - 99 mg/dL    Comment: Glucose reference range applies only to samples taken after fasting for at least 8 hours.   BUN 31 (H) 8 - 23 mg/dL   Creatinine, Ser 2.35 (H) 0.61 - 1.24 mg/dL   Calcium 9.3 8.9 - 10.3 mg/dL   GFR, Estimated 30 (L) >60 mL/min    Comment: (NOTE) Calculated using the CKD-EPI Creatinine Equation (2021)    Anion gap 8 5 - 15    Comment: Performed at [facility], [address] [place], [place] [address]  Protime-INR     Status: None   Collection Time: 06/24/22 12:22 AM  Result Value Ref Range   Prothrombin Time 13.8 11.4 - 15.2 seconds   INR 1.1 0.8 - 1.2    Comment: (NOTE) INR goal varies based on device and disease states. Performed at [facility], [address] [place], [place] [address]   APTT     Status: None   Collection Time: 06/24/22 12:22 AM  Result Value  Ref Range   aPTT 25 24 - 36 seconds    Comment: Performed at [facility], [address] [place], [place] [address]  CBC with Differential/Platelet     Status: Abnormal   Collection Time: 06/24/22  2:50 AM  Result Value Ref Range   WBC 8.1 4.0 - 10.5 K/uL   RBC 3.88 (L) 4.22 - 5.81 MIL/uL   Hemoglobin 12.1 (L) 13.0 - 17.0 g/dL   HCT 35.6 (L) 39.0 - 52.0 %   MCV 91.8 80.0 - 100.0 fL   MCH 31.2 26.0 - 34.0 pg   MCHC 34.0 30.0 - 36.0 g/dL   RDW 13.8 11.5 - 15.5 %   Platelets 183 150 - 400 K/uL   nRBC 0.0 0.0 - 0.2 %   Neutrophils Relative % 68 %   Neutro Abs 5.6 1.7 - 7.7 K/uL   Lymphocytes Relative 13 %   Lymphs Abs 1.1 0.7 - 4.0 K/uL   Monocytes Relative 11 %   Monocytes Absolute 0.9 0.1 - 1.0 K/uL   Eosinophils Relative 7 %   Eosinophils Absolute 0.5 0.0 - 0.5 K/uL   Basophils Relative 1 %   Basophils Absolute 0.0 0.0 - 0.1 K/uL   Immature Granulocytes 0 %   Abs Immature Granulocytes 0.03 0.00 - 0.07 K/uL    Comment: Performed at [facility], [address] [place], [place] [address]  Comprehensive metabolic panel     Status: Abnormal   Collection Time: 06/24/22  2:50 AM  Result Value Ref Range   Sodium 138 135 - 145 mmol/L   Potassium 3.1 (L) 3.5 - 5.1 mmol/L   Chloride 102 98 - 111 mmol/L   CO2 27 22 - 32 mmol/L   Glucose, Bld 91 70 - 99  mg/dL    Comment: Glucose reference range applies only to samples taken after fasting for at least 8 hours.   BUN 28 (H) 8 - 23 mg/dL   Creatinine, Ser 2.36 (H) 0.61 - 1.24 mg/dL   Calcium 9.0 8.9 - 10.3 mg/dL   Total Protein 5.8 (L) 6.5 - 8.1 g/dL   Albumin 3.3 (L) 3.5 - 5.0 g/dL   AST 61 (H) 15 - 41 U/L   ALT 26 0 - 44 U/L   Alkaline Phosphatase 66 38 - 126 U/L   Total Bilirubin 0.8 0.3 - 1.2 mg/dL   GFR, Estimated 30 (L) >60 mL/min    Comment: (NOTE) Calculated using the CKD-EPI Creatinine Equation (2021)    Anion gap 9 5 - 15    Comment: Performed at [facility], [place] [address],  [place], [place] [address]  Magnesium     Status: None   Collection Time: 06/24/22  2:50 AM  Result Value Ref Range   Magnesium 2.2 1.7 - 2.4 mg/dL    Comment: Performed at [facility] [address] [place], [place] [address]  Brain natriuretic peptide     Status: Abnormal   Collection Time: 06/24/22  2:50 AM  Result Value Ref Range   B Natriuretic Peptide 263.9 (H) 0.0 - 100.0 pg/mL    Comment: Performed at [facility] [address] [place], [place] [address]  Glucose, capillary     Status: Abnormal   Collection Time: 06/24/22  8:07 AM  Result Value Ref Range   Glucose-Capillary 173 (H) 70 - 99 mg/dL    Comment: Glucose reference range applies only to samples taken after fasting for at least 8 hours.    CT Foot Right Wo Contrast  Result Date: 06/23/2022 CLINICAL DATA:  Foot trauma, occult fracture suspected, xray equivocal (Age >= 6y) EXAM: CT OF THE RIGHT FOOT WITHOUT CONTRAST TECHNIQUE: Multidetector CT imaging of the right foot was performed according to the standard protocol. Multiplanar CT image reconstructions were also generated. RADIATION DOSE REDUCTION: This exam was performed according to the departmental dose-optimization program which includes automated exposure control, adjustment of the mA and/or kV according to patient size and/or use of iterative reconstruction technique. COMPARISON:  Radiograph earlier today. FINDINGS: Bones/Joint/Cartilage Multiple metatarsal and midfoot fractures. The second and third metatarsal fractures are segmental. This includes a mildly comminuted and displaced fracture involving the base of the first metatarsal extending into the tarsal metatarsal joint. Comminuted fracture involving the base of the second metatarsal with mild displacement extending into the tarsal metatarsal joint. Fracture involves the region of the Lisfranc ligament insertion which is poorly defined. There is a displaced mildly comminuted fracture of the second  metatarsal neck. Tiny fractures involving the anterolateral cuneiform and base of the third metatarsal. Displaced minimally comminuted distal third metatarsal neck fracture. Mildly comminuted intra-articular fourth proximal metatarsal fracture with intra-articular involvement. Nondisplaced mildly comminuted first proximal phalanx fracture. Ligaments The Lisfranc ligament is poorly defined. Muscles and Tendons Ill-defined musculature involving the intrinsic musculature of the foot likely represent intramuscular hemorrhage. There is no evidence of tendinous entrapment at the ankle. Soft tissues Prominent dorsal and lesser plantar soft tissue edema. IMPRESSION: 1. Multiple metatarsal and midfoot fractures including the first through fourth proximal metatarsals, the second and third metatarsal fractures are segmental with additional fractures of the distal metatarsal necks. 2. Fractures involve the Lisfranc ligament insertion which is poorly defined and likely torn. 3. Tiny fracture from the lateral cuneiform.  4. Comminuted first proximal phalanx fracture. Electronically Signed   By: Keith Rake M.D.   On: 06/23/2022 21:09   CT Cervical Spine Wo Contrast  Result Date: 06/23/2022 CLINICAL DATA:  Status post trauma. EXAM: CT CERVICAL SPINE WITHOUT CONTRAST TECHNIQUE: Multidetector CT imaging of the cervical spine was performed without intravenous contrast. Multiplanar CT image reconstructions were also generated. RADIATION DOSE REDUCTION: This exam was performed according to the departmental dose-optimization program which includes automated exposure control, adjustment of the mA and/or kV according to patient size and/or use of iterative reconstruction technique. COMPARISON:  None Available. FINDINGS: Alignment: There is reversal of the normal cervical spine lordosis. Skull base and vertebrae: No acute fracture. No primary bone lesion or focal pathologic process. Soft tissues and spinal canal: No prevertebral  fluid or swelling. No visible canal hematoma. Disc levels: Moderate severity endplate sclerosis, marked severity anterior osteophyte formation and mild posterior bony spurring are seen at the levels of C3-C4, C4-C5, C5-C6 and C6-C7. Marked severity intervertebral disc space narrowing is seen at C3-C4, C5-C6 and C6-C7. Bilateral moderate severity multilevel facet joint hypertrophy is noted. Upper chest: Negative. Other: None. IMPRESSION: 1. No acute fracture or subluxation in the cervical spine. 2. Moderate to marked severity multilevel degenerative changes, as described above. Electronically Signed   By: Virgina Norfolk M.D.   On: 06/23/2022 20:43   CT Head Wo Contrast  Result Date: 06/23/2022 CLINICAL DATA:  Status post fall. EXAM: CT HEAD WITHOUT CONTRAST TECHNIQUE: Contiguous axial images were obtained from the base of the skull through the vertex without intravenous contrast. RADIATION DOSE REDUCTION: This exam was performed according to the departmental dose-optimization program which includes automated exposure control, adjustment of the mA and/or kV according to patient size and/or use of iterative reconstruction technique. COMPARISON:  November 18, 2020 FINDINGS: Brain: There is mild cerebral atrophy with widening of the extra-axial spaces and ventricular dilatation. There are areas of decreased attenuation within the white matter tracts of the supratentorial brain, consistent with microvascular disease changes. Vascular: No hyperdense vessel or unexpected calcification. Skull: Normal. Negative for fracture or focal lesion. Sinuses/Orbits: Very mild bilateral ethmoid sinus mucosal thickening is noted. Other: None. IMPRESSION: 1. No acute intracranial abnormality. 2. Generalized cerebral atrophy and microvascular disease changes of the supratentorial brain. Electronically Signed   By: Virgina Norfolk M.D.   On: 06/23/2022 20:42   DG Foot Complete Right  Result Date: 06/23/2022 CLINICAL DATA:   Trauma, fall EXAM: RIGHT FOOT COMPLETE - 3+ VIEW COMPARISON:  None Available. FINDINGS: There is comminuted undisplaced fracture in the base and shaft of proximal phalanx of big toe. There is comminuted fracture in the necks of right second and third metatarsals with medial displacement of distal fracture fragments. There is a proximally 5 mm distraction of fracture fragments. There is faint lucency in the medial aspect of head of the fourth metacarpal seen in the AP view which may be a normal variation or suggest undisplaced fracture. There is comminuted fracture in the base of first metatarsal. Comminuted fracture is seen in the base of second metatarsal. As far as seen, there is no significant medial displacement of base of the first metatarsal or lateral displacement of bases of the third, fourth and fifth metatarsals. There is widening of space between bases of the first and second metatarsals. There is offset in alignment of distal articular surfaces of medial and middle cuneiform. There is marked soft tissue swelling over the dorsum of the foot. IMPRESSION: Comminuted undisplaced fracture is  seen in the proximal phalanx of right big toe. Comminuted displaced fractures are seen in the distal portions of right second and third metatarsals. Comminuted fractures are seen in bases of first and second metatarsals. Possibility of Lisfranc type injury should be considered. Follow-up CT as clinically warranted may be considered. Electronically Signed   By: Elmer Picker M.D.   On: 06/23/2022 19:52    Intake/Output      10/18 0701 10/19 0700 10/19 0701 10/20 0700   P.O. 240    Total Intake 240    Urine 1560    Total Output 1560    Net -1320            ROS No recent fever, bleeding abnormalities, urologic dysfunction, GI problems, or weight gain. Blood pressure 133/79, pulse 63, temperature 98.6 F (37 C), resp. rate 18, SpO2 96 %. Physical Exam NCAT RLE Dressing intact, clean, dry  Edema/  swelling severe with massive bulla  Sens: DPN, SPN, TN intact  Motor: too much pain  Brisk cap refill 1 sec, warm to touch        Assessment/Plan:  Severe right foot Lisfranc fracture dislocation and soft tissue swelling  Too swollen to proceed with ORIF at this time ICE, elevate, NWB Lovenox for DVT prophylaxis F/u next week for reassessment  Weightbearing: NWB RLE Insicional and dressing care: Dressings left intact until follow-up Orthopedic device(s): None and Splint Showering: if protect splint with bag VTE prophylaxis: Lovenox '40mg'$  qd  2 weeks Pain control: Percocet Follow - up plan: 1 week Contact information:  Altamese Worthington MD, Ainsley Spinner PA   Altamese Alamosa, MD [facility], [facility] [phone]  06/24/2022, 5:15 PM  [facility] [name] [address] [phone] Jenetta [phone] (F)    After 5pm and on the weekends please log on to [facility], go to orthopaedics and the look under the [facility] Call for the provider(s) on call. You can also call our office at [phone] and then follow the prompts to be connected to the call team.

## 2022-06-24 NOTE — Plan of Care (Signed)

## 2022-06-25 ENCOUNTER — Telehealth (HOSPITAL_COMMUNITY): Payer: Self-pay

## 2022-06-25 ENCOUNTER — Other Ambulatory Visit (HOSPITAL_COMMUNITY): Payer: Self-pay

## 2022-06-25 ENCOUNTER — Observation Stay (HOSPITAL_COMMUNITY): Payer: PPO

## 2022-06-25 DIAGNOSIS — W19XXXA Unspecified fall, initial encounter: Secondary | ICD-10-CM | POA: Diagnosis not present

## 2022-06-25 DIAGNOSIS — Y92009 Unspecified place in unspecified non-institutional (private) residence as the place of occurrence of the external cause: Secondary | ICD-10-CM | POA: Diagnosis not present

## 2022-06-25 DIAGNOSIS — S92311A Displaced fracture of first metatarsal bone, right foot, initial encounter for closed fracture: Secondary | ICD-10-CM | POA: Diagnosis not present

## 2022-06-25 DIAGNOSIS — I5032 Chronic diastolic (congestive) heart failure: Secondary | ICD-10-CM | POA: Diagnosis not present

## 2022-06-25 DIAGNOSIS — S92314A Nondisplaced fracture of first metatarsal bone, right foot, initial encounter for closed fracture: Secondary | ICD-10-CM | POA: Diagnosis not present

## 2022-06-25 DIAGNOSIS — S92324A Nondisplaced fracture of second metatarsal bone, right foot, initial encounter for closed fracture: Secondary | ICD-10-CM | POA: Diagnosis not present

## 2022-06-25 DIAGNOSIS — S92331A Displaced fracture of third metatarsal bone, right foot, initial encounter for closed fracture: Secondary | ICD-10-CM | POA: Diagnosis not present

## 2022-06-25 DIAGNOSIS — S93324A Dislocation of tarsometatarsal joint of right foot, initial encounter: Secondary | ICD-10-CM | POA: Diagnosis not present

## 2022-06-25 LAB — VITAMIN D 25 HYDROXY (VIT D DEFICIENCY, FRACTURES): Vit D, 25-Hydroxy: 68.04 ng/mL (ref 30–100)

## 2022-06-25 LAB — BASIC METABOLIC PANEL
Anion gap: 14 (ref 5–15)
BUN: 22 mg/dL (ref 8–23)
CO2: 26 mmol/L (ref 22–32)
Calcium: 9.1 mg/dL (ref 8.9–10.3)
Chloride: 99 mmol/L (ref 98–111)
Creatinine, Ser: 2.13 mg/dL — ABNORMAL HIGH (ref 0.61–1.24)
GFR, Estimated: 34 mL/min — ABNORMAL LOW (ref >60)
Glucose, Bld: 102 mg/dL — ABNORMAL HIGH (ref 70–99)
Potassium: 4 mmol/L (ref 3.5–5.1)
Sodium: 139 mmol/L (ref 135–145)

## 2022-06-25 MED ORDER — METOPROLOL SUCCINATE ER 50 MG PO TB24
200.0000 mg | ORAL_TABLET | Freq: Every day | ORAL | Status: DC
  Administered 2022-06-25 – 2022-06-29 (×5): 200 mg via ORAL
  Filled 2022-06-25 (×5): qty 4

## 2022-06-25 MED ORDER — GABAPENTIN 100 MG PO CAPS
100.0000 mg | ORAL_CAPSULE | Freq: Two times a day (BID) | ORAL | Status: DC
  Administered 2022-06-25 – 2022-06-29 (×9): 100 mg via ORAL
  Filled 2022-06-25 (×9): qty 1

## 2022-06-25 MED ORDER — DILTIAZEM HCL ER BEADS 240 MG PO CP24: 360.0000 mg | ORAL_CAPSULE | Freq: Every day | ORAL | Status: DC

## 2022-06-25 MED ORDER — SACUBITRIL-VALSARTAN 97-103 MG PO TABS
1.0000 | ORAL_TABLET | Freq: Two times a day (BID) | ORAL | Status: DC
  Administered 2022-06-25 – 2022-06-29 (×9): 1 via ORAL
  Filled 2022-06-25 (×11): qty 1

## 2022-06-25 MED ORDER — OXYCODONE-ACETAMINOPHEN 7.5-325 MG PO TABS
1.0000 | ORAL_TABLET | ORAL | Status: DC | PRN
  Administered 2022-06-25 – 2022-06-26 (×3): 2 via ORAL
  Administered 2022-06-26: 1 via ORAL
  Administered 2022-06-27 (×3): 2 via ORAL
  Filled 2022-06-25: qty 2
  Filled 2022-06-25: qty 1
  Filled 2022-06-25 (×5): qty 2

## 2022-06-25 MED ORDER — GABAPENTIN 100 MG PO CAPS
100.0000 mg | ORAL_CAPSULE | Freq: Two times a day (BID) | ORAL | 0 refills | Status: DC
  Filled 2022-06-25: qty 60, 30d supply, fill #0

## 2022-06-25 MED ORDER — APIXABAN 2.5 MG PO TABS
2.5000 mg | ORAL_TABLET | Freq: Two times a day (BID) | ORAL | Status: DC
  Administered 2022-06-25 – 2022-06-29 (×8): 2.5 mg via ORAL
  Filled 2022-06-25 (×8): qty 1

## 2022-06-25 MED ORDER — ISOSORBIDE MONONITRATE ER 60 MG PO TB24: 120.0000 mg | ORAL_TABLET | Freq: Every day | ORAL | Status: DC

## 2022-06-25 MED ORDER — TRAZODONE HCL 50 MG PO TABS
150.0000 mg | ORAL_TABLET | Freq: Every evening | ORAL | Status: DC | PRN
  Administered 2022-06-26 – 2022-06-28 (×3): 150 mg via ORAL
  Filled 2022-06-25 (×3): qty 3

## 2022-06-25 MED ORDER — OXYCODONE-ACETAMINOPHEN 7.5-325 MG PO TABS
1.0000 | ORAL_TABLET | Freq: Four times a day (QID) | ORAL | 0 refills | Status: DC | PRN
  Filled 2022-06-25: qty 50, 7d supply, fill #0

## 2022-06-25 MED ORDER — APIXABAN 2.5 MG PO TABS
2.5000 mg | ORAL_TABLET | Freq: Two times a day (BID) | ORAL | 0 refills | Status: DC
  Filled 2022-06-25 – 2022-08-26 (×2): qty 60, 30d supply, fill #0

## 2022-06-25 MED ORDER — ASPIRIN 81 MG PO TBEC
80.0000 mg | DELAYED_RELEASE_TABLET | Freq: Every day | ORAL | Status: DC
  Administered 2022-06-25 – 2022-06-29 (×5): 81 mg via ORAL
  Filled 2022-06-25 (×5): qty 1

## 2022-06-25 MED ORDER — TAMSULOSIN HCL 0.4 MG PO CAPS
0.4000 mg | ORAL_CAPSULE | Freq: Every day | ORAL | Status: DC
  Administered 2022-06-25 – 2022-06-29 (×5): 0.4 mg via ORAL
  Filled 2022-06-25 (×5): qty 1

## 2022-06-25 MED ORDER — ASCORBIC ACID 500 MG PO TABS
500.0000 mg | ORAL_TABLET | Freq: Every day | ORAL | 0 refills | Status: DC
  Filled 2022-06-25: qty 30, 30d supply, fill #0

## 2022-06-25 MED ORDER — VITAMIN C 500 MG PO TABS
500.0000 mg | ORAL_TABLET | Freq: Every day | ORAL | Status: DC
  Administered 2022-06-25 – 2022-06-29 (×5): 500 mg via ORAL
  Filled 2022-06-25 (×5): qty 1

## 2022-06-25 MED ORDER — CYCLOBENZAPRINE HCL 5 MG PO TABS
5.0000 mg | ORAL_TABLET | Freq: Three times a day (TID) | ORAL | 0 refills | Status: DC | PRN
  Filled 2022-06-25: qty 60, 20d supply, fill #0

## 2022-06-25 NOTE — Progress Notes (Addendum)
ANTICOAGULATION CONSULT NOTE - Initial Consult  Pharmacy Consult for apixaban Indication: VTE prophylaxis s/p fracture  Allergies  Allergen Reactions   Codeine Nausea And Vomiting   Hydrochlorothiazide Other (See Comments)    Unknown per Pt.    Hydrocodone-Acetaminophen Nausea And Vomiting   Hydromorphone Other (See Comments)    hallucinations     Patient Measurements:    Vital Signs: Temp: 99.1 F (37.3 C) (10/20 0851) Temp Source: Oral (10/20 0851) BP: 158/90 (10/20 0851) Pulse Rate: 65 (10/20 0851)  Labs: Recent Labs    06/24/22 0022 06/24/22 0250  HGB 12.6* 12.1*  HCT 36.5* 35.6*  PLT 165 183  APTT 25  --   LABPROT 13.8  --   INR 1.1  --   CREATININE 2.35* 2.36*    CrCl cannot be calculated (Unknown ideal weight.).   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   ED (erectile dysfunction)    GERD (gastroesophageal reflux disease)    Hypercalcemia    Hyperlipidemia    Hypertension    Insomnia    Long-term use of high-risk medication    Nephrocalcinosis    Nephrolithiasis    Obesity    Osteopenia    Pancreatitis    admitted 03/19/01-06/05/01   PE (pulmonary embolism) 01/30/2013   Proteinuria    Sarcoidosis    Sleep apnea    Smoker    Stroke (HCC)     Medications:  Scheduled:   vitamin C  500 mg Oral Daily   aspirin EC  81 mg Oral Daily   atorvastatin  20 mg Oral Daily   gabapentin  100 mg Oral BID   metoprolol  200 mg Oral Daily   polyethylene glycol  17 g Oral BID   sacubitril-valsartan  1 tablet Oral BID   tamsulosin  0.4 mg Oral Daily    Assessment: 66 yo male presenting after a fall. CT of R foot showed multiple metatarsal and midfoot fracture. Ortho consulted and unable to perform  ORIF at this time due to soft tissue swelling - likely in 2-3 weeks. Pharmacy has been consulted to dose apixaban for VTE prophylaxis.   Goal of Therapy:  Monitor platelets by anticoagulation protocol: Yes   Plan:  D/c Lovenox Begin apixaban 2.'5mg'$  PO BID  Pharmacy to sign off consult at this time - will continue to monitor patient peripherally.  Dimple Nanas, PharmD, BCPS 06/25/2022 11:02 AM

## 2022-06-25 NOTE — Progress Notes (Signed)
Patient moved to a new bed. Alarm set, call bell within reach and floor mats on floor. Patient advised and reminded to call for assistance if he needs help.

## 2022-06-25 NOTE — Progress Notes (Signed)
[facility] PROGRESS NOTE    Progress Note  [name]  VQQ:[id number] DOB: May 16, 1956 DOA: 06/23/2022 PCP: [name], MD     Brief Narrative:   [name] is an 66 y.o. male 66 year old with past medical history of chronic kidney disease stage IIIb with a baseline creatinine of 4.3-3.2, chronic diastolic heart failure, hyperlipidemia comes in to [facility] complaining of right foot pain after a mechanical fall, CT of the right foot showed multiple metatarsal and midfoot fracture including the fourth second and third, fracture involving the Lisfranc ligament insertion, orthopedic surgery was consulted.    Assessment/Plan:   Closed nondisplaced fracture of first right metatarsal bone: Orthopedic surgery was consulted, continue narcotics for pain Avoid NSAIDs due to chronic renal disease. Chest pain is not controlled has not had a bowel movement. I have told the patient he needs to focus on oral narcotics. Orthopedic surgery recommended to follow-up with them as an outpatient as the foot is too swollen to proceed with surgical intervention. Sickle therapy recommended home health PT.  Chronic kidney stage IIIb: With a baseline creatinine of 1.7-2.2, on admission 2.3 creatinine appears to be at baseline. He did not come in and acute kidney injury.  Mechanical fall: Physical therapy to take weightbearing after surgical intervention physical therapy has been consulted post orthopedic surgery recommendations.  Hypokalemia: 3.4 was given potassium orally and hydrated. Was a clear orally recheck basic metabolic panel.  Chronic diastolic CHF (congestive heart failure) (HCC) Resume torsemide metoprolol diltiazem and Entresto. Hold hydralazine.  HLD (hyperlipidemia) Continue statins.    DVT prophylaxis: lovenox Family Communication:none Status is: Observation The patient remains OBS appropriate and will d/c before 2 midnights.    Code Status:     Code  Status Orders  (From admission, onward)           Start     Ordered   06/24/22 0218  Full code  Continuous        06/24/22 0218           Code Status History     Date Active Date Inactive Code Status Order ID Comments User Context   01/21/2022 0710 01/23/2022 2126 Full Code [id number]  Vernelle Emerald, MD ED   05/12/2021 0900 05/12/2021 1645 Full Code [id number]  [name], MD Inpatient   04/17/2021 2251 04/23/2021 1846 Full Code [id number]  Chotiner, Yevonne Aline, MD ED   04/07/2021 1653 04/09/2021 1548 Full Code [id number]  Lavina Hamman, MD ED   03/22/2021 0955 03/24/2021 1721 Full Code [id number]  Karmen Bongo, MD ED   11/19/2020 0058 11/22/2020 1913 Full Code [id number]  Rise Patience, MD ED   03/14/2019 2047 03/15/2019 1804 Full Code [id number]  Vashti Hey, MD Inpatient         IV Access:   Peripheral IV   Procedures and diagnostic studies:   DG Foot Complete Right  Result Date: 06/25/2022 CLINICAL DATA:  Fall EXAM: RIGHT FOOT COMPLETE - 3+ VIEW COMPARISON:  Two days ago FINDINGS: A splint has been placed. Displaced metatarsal neck fractures involving the second and third rays. The first, second, and third metatarsal show proximal fracturing with subluxation at the Lisfranc interval where there is avulsion fracture at the second metatarsal attachment of the Lisfranc ligament. Nondisplaced first proximal phalanx fracture reaching the first MTP joint. IMPRESSION: Unchanged alignment of multiple foot fractures which are underestimated relative to preceding CT. Electronically Signed   By: Jorje Guild M.D.   On:  06/25/2022 07:44   CT Foot Right Wo Contrast  Result Date: 06/23/2022 CLINICAL DATA:  Foot trauma, occult fracture suspected, xray equivocal (Age >= 6y) EXAM: CT OF THE RIGHT FOOT WITHOUT CONTRAST TECHNIQUE: Multidetector CT imaging of the right foot was performed according to the standard protocol. Multiplanar CT image reconstructions were also  generated. RADIATION DOSE REDUCTION: This exam was performed according to the departmental dose-optimization program which includes automated exposure control, adjustment of the mA and/or kV according to patient size and/or use of iterative reconstruction technique. COMPARISON:  Radiograph earlier today. FINDINGS: Bones/Joint/Cartilage Multiple metatarsal and midfoot fractures. The second and third metatarsal fractures are segmental. This includes a mildly comminuted and displaced fracture involving the base of the first metatarsal extending into the tarsal metatarsal joint. Comminuted fracture involving the base of the second metatarsal with mild displacement extending into the tarsal metatarsal joint. Fracture involves the region of the Lisfranc ligament insertion which is poorly defined. There is a displaced mildly comminuted fracture of the second metatarsal neck. Tiny fractures involving the anterolateral cuneiform and base of the third metatarsal. Displaced minimally comminuted distal third metatarsal neck fracture. Mildly comminuted intra-articular fourth proximal metatarsal fracture with intra-articular involvement. Nondisplaced mildly comminuted first proximal phalanx fracture. Ligaments The Lisfranc ligament is poorly defined. Muscles and Tendons Ill-defined musculature involving the intrinsic musculature of the foot likely represent intramuscular hemorrhage. There is no evidence of tendinous entrapment at the ankle. Soft tissues Prominent dorsal and lesser plantar soft tissue edema. IMPRESSION: 1. Multiple metatarsal and midfoot fractures including the first through fourth proximal metatarsals, the second and third metatarsal fractures are segmental with additional fractures of the distal metatarsal necks. 2. Fractures involve the Lisfranc ligament insertion which is poorly defined and likely torn. 3. Tiny fracture from the lateral cuneiform. 4. Comminuted first proximal phalanx fracture. Electronically  Signed   By: Keith Rake M.D.   On: 06/23/2022 21:09   CT Cervical Spine Wo Contrast  Result Date: 06/23/2022 CLINICAL DATA:  Status post trauma. EXAM: CT CERVICAL SPINE WITHOUT CONTRAST TECHNIQUE: Multidetector CT imaging of the cervical spine was performed without intravenous contrast. Multiplanar CT image reconstructions were also generated. RADIATION DOSE REDUCTION: This exam was performed according to the departmental dose-optimization program which includes automated exposure control, adjustment of the mA and/or kV according to patient size and/or use of iterative reconstruction technique. COMPARISON:  None Available. FINDINGS: Alignment: There is reversal of the normal cervical spine lordosis. Skull base and vertebrae: No acute fracture. No primary bone lesion or focal pathologic process. Soft tissues and spinal canal: No prevertebral fluid or swelling. No visible canal hematoma. Disc levels: Moderate severity endplate sclerosis, marked severity anterior osteophyte formation and mild posterior bony spurring are seen at the levels of C3-C4, C4-C5, C5-C6 and C6-C7. Marked severity intervertebral disc space narrowing is seen at C3-C4, C5-C6 and C6-C7. Bilateral moderate severity multilevel facet joint hypertrophy is noted. Upper chest: Negative. Other: None. IMPRESSION: 1. No acute fracture or subluxation in the cervical spine. 2. Moderate to marked severity multilevel degenerative changes, as described above. Electronically Signed   By: Virgina Norfolk M.D.   On: 06/23/2022 20:43   CT Head Wo Contrast  Result Date: 06/23/2022 CLINICAL DATA:  Status post fall. EXAM: CT HEAD WITHOUT CONTRAST TECHNIQUE: Contiguous axial images were obtained from the base of the skull through the vertex without intravenous contrast. RADIATION DOSE REDUCTION: This exam was performed according to the departmental dose-optimization program which includes automated exposure control, adjustment of the mA and/or  kV  according to patient size and/or use of iterative reconstruction technique. COMPARISON:  November 18, 2020 FINDINGS: Brain: There is mild cerebral atrophy with widening of the extra-axial spaces and ventricular dilatation. There are areas of decreased attenuation within the white matter tracts of the supratentorial brain, consistent with microvascular disease changes. Vascular: No hyperdense vessel or unexpected calcification. Skull: Normal. Negative for fracture or focal lesion. Sinuses/Orbits: Very mild bilateral ethmoid sinus mucosal thickening is noted. Other: None. IMPRESSION: 1. No acute intracranial abnormality. 2. Generalized cerebral atrophy and microvascular disease changes of the supratentorial brain. Electronically Signed   By: Virgina Norfolk M.D.   On: 06/23/2022 20:42   DG Foot Complete Right  Result Date: 06/23/2022 CLINICAL DATA:  Trauma, fall EXAM: RIGHT FOOT COMPLETE - 3+ VIEW COMPARISON:  None Available. FINDINGS: There is comminuted undisplaced fracture in the base and shaft of proximal phalanx of big toe. There is comminuted fracture in the necks of right second and third metatarsals with medial displacement of distal fracture fragments. There is a proximally 5 mm distraction of fracture fragments. There is faint lucency in the medial aspect of head of the fourth metacarpal seen in the AP view which may be a normal variation or suggest undisplaced fracture. There is comminuted fracture in the base of first metatarsal. Comminuted fracture is seen in the base of second metatarsal. As far as seen, there is no significant medial displacement of base of the first metatarsal or lateral displacement of bases of the third, fourth and fifth metatarsals. There is widening of space between bases of the first and second metatarsals. There is offset in alignment of distal articular surfaces of medial and middle cuneiform. There is marked soft tissue swelling over the dorsum of the foot. IMPRESSION:  Comminuted undisplaced fracture is seen in the proximal phalanx of right big toe. Comminuted displaced fractures are seen in the distal portions of right second and third metatarsals. Comminuted fractures are seen in bases of first and second metatarsals. Possibility of Lisfranc type injury should be considered. Follow-up CT as clinically warranted may be considered. Electronically Signed   By: Elmer Picker M.D.   On: 06/23/2022 19:52     Medical Consultants:   None.   Subjective:    [name] relates the pain is not controlled.  Objective:    Vitals:   06/24/22 1319 06/24/22 2145 06/25/22 0532 06/25/22 0851  BP: 133/79 (!) 161/83 (!) 152/103 (!) 158/90  Pulse: 63 65 86 65  Resp: '18 16 18 15  '$ Temp: 98.6 F (37 C) 99.2 F (37.3 C) 99 F (37.2 C) 99.1 F (37.3 C)  TempSrc:   Oral Oral  SpO2: 96% (!) 88% 100% 93%   SpO2: 93 %   Intake/Output Summary (Last 24 hours) at 06/25/2022 3557 Last data filed at 06/25/2022 0345 Gross per 24 hour  Intake --  Output 450 ml  Net -450 ml    There were no vitals filed for this visit.  Exam: General exam: In no acute distress. Respiratory system: Good air movement and clear to auscultation. Cardiovascular system: S1 & S2 heard, RRR. No JVD. Gastrointestinal system: Abdomen is nondistended, soft and nontender.  Extremities: No pedal edema. Skin: No rashes, lesions or ulcers Psychiatry: Judgement and insight appear normal. Mood & affect appropriate.   Data Reviewed:    Labs: Basic Metabolic Panel: Recent Labs  Lab 06/24/22 0022 06/24/22 0250  NA 139 138  K 3.4* 3.1*  CL 101 102  CO2 30  27  GLUCOSE 94 91  BUN 31* 28*  CREATININE 2.35* 2.36*  CALCIUM 9.3 9.0  MG  --  2.2    GFR CrCl cannot be calculated (Unknown ideal weight.). Liver Function Tests: Recent Labs  Lab 06/24/22 0250  AST 61*  ALT 26  ALKPHOS 66  BILITOT 0.8  PROT 5.8*  ALBUMIN 3.3*    No results for input(s): "LIPASE",  "AMYLASE" in the last 168 hours. No results for input(s): "AMMONIA" in the last 168 hours. Coagulation profile Recent Labs  Lab 06/24/22 0022  INR 1.1    COVID-19 Labs  No results for input(s): "DDIMER", "FERRITIN", "LDH", "CRP" in the last 72 hours.  Lab Results  Component Value Date   SARSCOV2NAA NEGATIVE 01/21/2022   SARSCOV2NAA NEGATIVE 04/17/2021   SARSCOV2NAA NEGATIVE 04/07/2021   [place] NEGATIVE 04/01/2021    CBC: Recent Labs  Lab 06/24/22 0022 06/24/22 0250  WBC 8.0 8.1  NEUTROABS  --  5.6  HGB 12.6* 12.1*  HCT 36.5* 35.6*  MCV 91.0 91.8  PLT 165 183    Cardiac Enzymes: No results for input(s): "CKTOTAL", "CKMB", "CKMBINDEX", "TROPONINI" in the last 168 hours. BNP (last 3 results) Recent Labs    09/21/21 0720 02/09/22 0713 02/23/22 0709  PROBNP 1,795* 2,662* 2,247*    CBG: Recent Labs  Lab 06/24/22 0807  GLUCAP 173*    D-Dimer: No results for input(s): "DDIMER" in the last 72 hours. Hgb A1c: No results for input(s): "HGBA1C" in the last 72 hours. Lipid Profile: No results for input(s): "CHOL", "HDL", "LDLCALC", "TRIG", "CHOLHDL", "LDLDIRECT" in the last 72 hours. Thyroid function studies: No results for input(s): "TSH", "T4TOTAL", "T3FREE", "THYROIDAB" in the last 72 hours.  Invalid input(s): "FREET3" Anemia work up: No results for input(s): "VITAMINB12", "FOLATE", "FERRITIN", "TIBC", "IRON", "RETICCTPCT" in the last 72 hours. Sepsis Labs: Recent Labs  Lab 06/24/22 0022 06/24/22 0250  WBC 8.0 8.1    Microbiology No results found for this or any previous visit (from the past 240 hour(s)).   Medications:    acetaminophen  650 mg Oral Q12H   atorvastatin  20 mg Oral Daily   enoxaparin (LOVENOX) injection  40 mg Subcutaneous Daily   polyethylene glycol  17 g Oral BID   Continuous Infusions:    LOS: 0 days   Charlynne Cousins  [facility]  06/25/2022, 9:03 AM

## 2022-06-25 NOTE — Progress Notes (Signed)
Patient went for imaging of his right foot around 7:10 a.m.Marland Kitchen  Patient returned to unit around 7:30 a.m.

## 2022-06-25 NOTE — Discharge Instructions (Addendum)
Orthopedic discharge instructions  Right foot fracture dislocation (Lisfranc fracture dislocation) You will need surgery to address however your swelling is too severe to safely proceed with this at this time  Nonweightbearing right leg.  Please use his walker or crutches to mobilize  You have been splinted to help your swelling resolve Do not remove splint Do not get splint wet  Ice and elevate your leg is much as possible.  Keep your "toes above your nose" It is okay to get up and move around but again keeping your leg elevated as much as possible will help the swelling resolve quickly  Call office with questions: [phone]  Information on my medicine - ELIQUIS (apixaban)  Why was Eliquis prescribed for you? Eliquis was prescribed for you to reduce the risk of blood clots forming after orthopedic procedure.    What do You need to know about Eliquis? Take your Eliquis TWICE DAILY - one tablet in the morning and one tablet in the evening with or without food.  It would be best to take the dose about the same time each day.  If you have difficulty swallowing the tablet whole please discuss with your pharmacist how to take the medication safely.  Take Eliquis exactly as prescribed by your doctor and DO NOT stop taking Eliquis without talking to the doctor who prescribed the medication.  Stopping without other medication to take the place of Eliquis may increase your risk of developing a clot.  After discharge, you should have regular check-up appointments with your healthcare provider that is prescribing your Eliquis.  What do you do if you miss a dose? If a dose of ELIQUIS is not taken at the scheduled time, take it as soon as possible on the same day and twice-daily administration should be resumed.  The dose should not be doubled to make up for a missed dose.  Do not take more than one tablet of ELIQUIS at the same time.  Important Safety Information A possible side  effect of Eliquis is bleeding. You should call your healthcare provider right away if you experience any of the following: Bleeding from an injury or your nose that does not stop. Unusual colored urine (red or dark brown) or unusual colored stools (red or black). Unusual bruising for unknown reasons. A serious fall or if you hit your head (even if there is no bleeding).  Some medicines may interact with Eliquis and might increase your risk of bleeding or clotting while on Eliquis. To help avoid this, consult your healthcare provider or pharmacist prior to using any new prescription or non-prescription medications, including herbals, vitamins, non-steroidal anti-inflammatory drugs (NSAIDs) and supplements.  This website has more information on Eliquis (apixaban): http://www.eliquis.com/eliquis/home

## 2022-06-25 NOTE — Progress Notes (Signed)
       CROSS COVER NOTE  NAME: [name] MRN: [id number] DOB : May 27, 1956    Date of Service   06/25/2022   HPI/Events of Note   Notified by RN of unwitnessed fall.  Patient was admitted due to closed nondisplaced fracture of first right metatarsal bone.  Orthopedic surgery following.  Patient currently receiving Dilaudid and Percocet for pain management.  Per RN, patient was found sitting on the floor. RN endorses patient complaining of pain on right foot. Patient denies landing on his back or head.  RN reports patient is alert and oriented x4, however had some mild confusion as to how he had his fall. VSS. Patient denies losing consciousness.  RN has been asked to document fall and additional findings. Unable to personally assess patient due to remote access.   Patient denies any coccyx, left lower extremity, head, or back pain. Will order image of right foot.    Interventions/ Plan   X-ray right foot       Raenette Rover, DNP, [place]

## 2022-06-25 NOTE — Telephone Encounter (Signed)
Pharmacy Patient Advocate Encounter  Insurance verification completed.    The patient is insured through [employer]   The patient is currently admitted and ran test claims for the following: Eliquis.  Copays and coinsurance results were relayed to Inpatient clinical team.  

## 2022-06-25 NOTE — Progress Notes (Signed)
Physical Therapy Treatment Patient Details Name: [name] MRN: [id number] DOB: [birth year] Today's Date: 06/25/2022   History of Present Illness 66 y/o presented to ED on 06/23/22 after fall. Sustained Lisfranc fracture on R foot. Unable to proceed with surgery at this time due to swelling. PMH: diastolic CHF, sarcoidosis, hypertension, hyperlipidemia, history of CVA, CKD stage IIIb.    PT Comments    Patient seen for follow up session. Patient demos impaired cognition compared to initial evaluation. Difficulty with memory, orientation, processing, and sequencing. Patient unable to maintain NWB on R in standing this date with difficulty sequencing task. Able to perform squat pivot transfer with maxA and step by step max cueing and demonstration to perform. Able to self propel w/c with B Ues and supervision. Wife present and requesting rehab as patient is unsafe to be home alone during the day as she works full time. Recommend SNF at discharge.     Recommendations for follow up therapy are one component of a multi-disciplinary discharge planning process, led by the attending physician.  Recommendations may be updated based on patient status, additional functional criteria and insurance authorization.  Follow Up Recommendations  Skilled nursing-short term rehab (<3 hours/day) Can patient physically be transported by private vehicle: No   Assistance Recommended at Discharge Frequent or constant Supervision/Assistance  Patient can return home with the following A lot of help with walking and/or transfers;A lot of help with bathing/dressing/bathroom;Assistance with cooking/housework;Direct supervision/assist for medications management;Direct supervision/assist for financial management;Assist for transportation;Help with stairs or ramp for entrance   Equipment Recommendations  Wheelchair (measurements PT);Wheelchair cushion (measurements PT);Rolling [place] Shampine (2 wheels);BSC/3in1     Recommendations for Other Services       Precautions / Restrictions Precautions Precautions: Fall Restrictions Weight Bearing Restrictions: Yes RLE Weight Bearing: Non weight bearing     Mobility  Bed Mobility Overal bed mobility: Needs Assistance Bed Mobility: Supine to Sit, Sit to Supine     Supine to sit: Supervision Sit to supine: Supervision   General bed mobility comments: supervision for safety    Transfers Overall transfer level: Needs assistance Equipment used: Rolling Eilyn Polack (2 wheels), None Transfers: Sit to/from Stand, Bed to chair/wheelchair/BSC Sit to Stand: Max assist, From elevated surface     Squat pivot transfers: Max assist     General transfer comment: required maxA to stand from EOB with max cues for maintaining NWB. Unable to maintain NWB in standing due to slow processing and difficulty understanding precautions. Demonstrated squat pivot technique to patient to transfer to/from w/c. Required maxA to transfer to/from w/c towards L side. Step by step cueing and instruction during transfer.    Ambulation/Gait                   Theme park manager mobility: Yes Wheelchair propulsion: Both upper extremities Wheelchair parts: Supervision/cueing Distance: 100 Wheelchair Assistance Details (indicate cue type and reason): supervision for safety. Cueing for turning technique and w/c management  Modified Rankin (Stroke Patients Only)       Balance Overall balance assessment: Needs assistance Sitting-balance support: No upper extremity supported, Feet supported Sitting balance-Leahy Scale: Good     Standing balance support: Bilateral upper extremity supported, Reliant on assistive device for balance Standing balance-Leahy Scale: Poor  Cognition Arousal/Alertness: Awake/alert Behavior During Therapy: WFL for tasks  assessed/performed Overall Cognitive Status: Impaired/Different from baseline Area of Impairment: Orientation, Attention, Memory, Following commands, Safety/judgement, Awareness, Problem solving                 Orientation Level: Disoriented to, Time, Place Current Attention Level: Sustained Memory: Decreased short-term memory, Decreased recall of precautions Following Commands: Follows one step commands inconsistently, Follows one step commands with increased time Safety/Judgement: Decreased awareness of deficits, Decreased awareness of safety Awareness: Emergent Problem Solving: Slow processing, Difficulty sequencing, Requires verbal cues, Requires tactile cues General Comments: disoriented to place and time. Frequently asking same questions or talking off subject. Difficulty following verbal commands and requiring visual and tactile cues. Wife present and states this happens with pain medication.        Exercises      General Comments General comments (skin integrity, edema, etc.): wife present and wanting rehab as patient is not safe to be home alone and reqiures physical assist to transfer at this time      Pertinent Vitals/Pain Pain Assessment Pain Assessment: Faces Faces Pain Scale: Hurts even more Pain Location: R foot Pain Descriptors / Indicators: Aching Pain Intervention(s): Monitored during session    Home Living                          Prior Function            PT Goals (current goals can now be found in the care plan section) Acute Rehab PT Goals PT Goal Formulation: With patient Time For Goal Achievement: 07/01/22 Potential to Achieve Goals: Good Progress towards PT goals: Progressing toward goals    Frequency    Min 3X/week      PT Plan Frequency needs to be updated;Discharge plan needs to be updated    Co-evaluation              AM-PAC PT "6 Clicks" Mobility   Outcome Measure  Help needed turning from your back to  your side while in a flat bed without using bedrails?: A Little Help needed moving from lying on your back to sitting on the side of a flat bed without using bedrails?: A Little Help needed moving to and from a bed to a chair (including a wheelchair)?: A Lot Help needed standing up from a chair using your arms (e.g., wheelchair or bedside chair)?: A Lot Help needed to walk in hospital room?: A Lot Help needed climbing 3-5 steps with a railing? : Total 6 Click Score: 13    End of Session Equipment Utilized During Treatment: Gait belt Activity Tolerance: Patient tolerated treatment well Patient left: in bed;with call bell/phone within reach;with bed alarm set;with family/visitor present Nurse Communication: Mobility status PT Visit Diagnosis: Unsteadiness on feet (R26.81);Other abnormalities of gait and mobility (R26.89);Muscle weakness (generalized) (M62.81);History of falling (Z91.81);Difficulty in walking, not elsewhere classified (R26.2);Pain Pain - Right/Left: Right Pain - part of body: Ankle and joints of foot     Time: 9528-4132 PT Time Calculation (min) (ACUTE ONLY): 53 min  Charges:  $Therapeutic Activity: 38-52 mins $Wheel Chair Management: 8-22 mins                     Marques Ericson A. Gilford Rile PT, DPT Acute Rehabilitation Services Office [phone]    Linna Hoff 06/25/2022, 4:46 PM

## 2022-06-25 NOTE — TOC Benefit Eligibility Note (Signed)
Patient Teacher, English as a foreign language completed.    The patient is currently admitted and upon discharge could be taking Eliquis 2.'5mg'$ .  The current 30 day co-pay is $128.27.   The patient is insured through [employer].     Joneen Boers, [place] Patient Advocate Specialist [place] Patient Advocate Team Direct Number: [phone] Fax: [phone]

## 2022-06-25 NOTE — Progress Notes (Signed)
Orthopedic Tech Progress Note Patient Details:  [name] Jan 15, 1956 [id number]  Ortho Devices Type of Ortho Device: Short leg splint, Stirrup splint Ortho Device/Splint Location: RLE Ortho Device/Splint Interventions: Ordered, Application, Adjustment   Post Interventions Patient Tolerated: Fair Instructions Provided: Care of device Patients first splint applied at another facility and was soiled with blood and fluid, so a new splint has been applied to the RLE following wound care received by PA Paul. Prior to splint application, the patient had multiple fracture blisters, and one  was drained and bandaged. Gauze placed between toes by PA, this was done to capture potential fluid from the blisters if they happen to burst, so the splint does not get filthy. Vernona Rieger 06/25/2022, 11:57 AM

## 2022-06-25 NOTE — Progress Notes (Signed)
Patient found sitting on floor around 5:20 a.m. by Mardene Celeste, RN. IV was out upon finding patient. Catheter was intact. Bed alarm stated failed alarm. Call light was within reach on bed. Per patient, he was asleep and next thing he knew he found himself on the floor. He does not remember how he got on the floor. Patient states he did not hit his head.  Vitals signs obtained and conducted full body assessment. Patient seems confused. Remembers he is in the hospital but cannot remember the reason why or how he hurt his leg. Patient has pain on right leg that was previously fractured but states he does not hurt anywhere else. Blister on his right foot is leaking and appears to have popped.  Oxycodone and Tylenol given for his pain. On call NP notified. NP ordering imaging of the right leg.

## 2022-06-25 NOTE — Progress Notes (Signed)
Orthopaedic Trauma Service Progress Note  Patient ID: [name] MRN: [id number] DOB/AGE: August 07, 1956 65 y.o.  Subjective:  Contacted by orthotech regarding drainage from fracture blisters Pt seen at bedside Drainage noted from large fracture blister over dorsum of foot.  There is also medipore tape on the medial margin of the fracture blister that is part of the dressing covering the medial foot wound  Pain doing ok  He did fall last night. Repeat xrays of R foot show no changes  Lives with his wife and grandkids.   1-2 stairs to get in the house About 12-13 stairs to get up to his bedroom but he can stay on main level    ROS As above  Objective:   VITALS:   Vitals:   06/24/22 1319 06/24/22 2145 06/25/22 0532 06/25/22 0851  BP: 133/79 (!) 161/83 (!) 152/103 (!) 158/90  Pulse: 63 65 86 65  Resp: '18 16 18 15  '$ Temp: 98.6 F (37 C) 99.2 F (37.3 C) 99 F (37.2 C) 99.1 F (37.3 C)  TempSrc:   Oral Oral  SpO2: 96% (!) 88% 100% 93%    Estimated body mass index is 29.02 kg/m as calculated from the following:   Height as of 05/20/22: 6' (1.829 m).   Weight as of 05/20/22: 97.1 kg.   Intake/Output      10/19 0701 10/20 0700 10/20 0701 10/21 0700   P.O.     Total Intake     Urine 450    Total Output 450    Net -450           LABS  Results for orders placed or performed during the hospital encounter of 06/23/22 (from the past 24 hour(s))  VITAMIN D 25 Hydroxy (Vit-D Deficiency, Fractures)     Status: None   Collection Time: 06/25/22  3:06 AM  Result Value Ref Range   Vit D, 25-Hydroxy 68.04 30 - 100 ng/mL     PHYSICAL EXAM:   Gen: sitting up in bed, pleasant  Ext:       Right Lower Extremity   Current splint removed  Large fracture blister to dorsum of R foot   Which extend to the toes   Smaller fracture blister lateral R foot   Wound medial foot is macerated but stable   It  was covered with a Vaseline gauze that was still folded in multiple layers    Hypersensitive foot   Good perfusion distally   Ext warm    Assessment/Plan:     Principal Problem:   Closed nondisplaced fracture of first right metatarsal bone Active Problems:   Acute renal failure superimposed on stage 3b chronic kidney disease (HCC)   Chronic diastolic CHF (congestive heart failure) ([place])   Fall at home, initial encounter   Hypokalemia   HLD (hyperlipidemia)   Anti-infectives (From admission, onward)    None     .  66 y/o male with complex medical history with acute R lisfranc fracture dislocation with severe soft tissue swelling and fracture blisters   -fall   - R lisfranc fracture dislocation   NWB R leg   Soft tissue swelling and blistering precludes operative intervention at this time  Would anticipate soft tissue being ready in about 2-3 weeks   Will place in new splint  While attempting to take the tape of his blister to eval medial wound broke and serous fluid was evacuated    Most of the fluid was evacuated from the blister    I left the blister along the lateral foot intact    Adaptic, 4x4 gauze and gauze between the webspaces of the foot were placed    I held the leg while the orthotechs applied a well padded splint (posterior and stirrup)   Aggressive ice and elevation    Elevate leg above heart    Continue therapies     Follow up with ortho next week for skin check     - Pain management:  Multimodal  No NSAIDs due to renal insufficiency   - DVT/PE prophylaxis:  Dc on eliquis   - Dispo:  Dc once clears PT/OT  Follow up with ortho in about 1 week   Jari Pigg, PA-C [phone] (C) 06/25/2022, 10:40 AM  [facility] [name] [address] [phone] Jenetta [phone] (F)    After 5pm and on the weekends please log on to Amion, go to orthopaedics and the look under the [facility] Call  for the provider(s) on call. You can also call our office at [phone] and then follow the prompts to be connected to the call team.   Patient ID: [name], male   DOB: 1955/12/24, 66 y.o.   MRN: [id number]

## 2022-06-25 NOTE — Progress Notes (Signed)
Orthopedic Tech Progress Note Patient Details:  [name] 12/12/1955 [id number] Was called by RN from night shift to come reapply splint. Noticed patient had multiple blisters on his foot under the ace wrap. The blister above his fractures measuring about 3-4 inches had been partially popped. I removed one layer of the soiled ace wrap and let the blister be uncovered due to it leaking blood and being restricted. Advised patient not to move foot or toes at all. Patient ID: HALLEY KINCER, male   DOB: Jul 05, 1956, 66 y.o.   MRN: [id number]  Chip Boer 06/25/2022, 8:33 AM

## 2022-06-26 DIAGNOSIS — S92314A Nondisplaced fracture of first metatarsal bone, right foot, initial encounter for closed fracture: Secondary | ICD-10-CM | POA: Diagnosis not present

## 2022-06-26 DIAGNOSIS — E876 Hypokalemia: Secondary | ICD-10-CM | POA: Diagnosis not present

## 2022-06-26 DIAGNOSIS — I5032 Chronic diastolic (congestive) heart failure: Secondary | ICD-10-CM | POA: Diagnosis not present

## 2022-06-26 LAB — BASIC METABOLIC PANEL
Anion gap: 8 (ref 5–15)
BUN: 14 mg/dL (ref 8–23)
CO2: 25 mmol/L (ref 22–32)
Calcium: 8.8 mg/dL — ABNORMAL LOW (ref 8.9–10.3)
Chloride: 102 mmol/L (ref 98–111)
Creatinine, Ser: 1.71 mg/dL — ABNORMAL HIGH (ref 0.61–1.24)
GFR, Estimated: 44 mL/min — ABNORMAL LOW (ref >60)
Glucose, Bld: 91 mg/dL (ref 70–99)
Potassium: 3.7 mmol/L (ref 3.5–5.1)
Sodium: 135 mmol/L (ref 135–145)

## 2022-06-26 LAB — URINALYSIS, COMPLETE (UACMP) WITH MICROSCOPIC
Bacteria, UA: NONE SEEN
Bilirubin Urine: NEGATIVE
Glucose, UA: 50 mg/dL — AB
Ketones, ur: NEGATIVE mg/dL
Leukocytes,Ua: NEGATIVE
Nitrite: NEGATIVE
Protein, ur: 100 mg/dL — AB
Specific Gravity, Urine: 1.014 (ref 1.005–1.030)
pH: 6 (ref 5.0–8.0)

## 2022-06-26 MED ORDER — POLYETHYLENE GLYCOL 3350 17 G PO PACK
17.0000 g | PACK | Freq: Two times a day (BID) | ORAL | Status: DC
  Administered 2022-06-26 – 2022-06-27 (×2): 17 g via ORAL
  Filled 2022-06-26 (×3): qty 1

## 2022-06-26 NOTE — NC FL2 (Signed)
[place] LEVEL OF CARE SCREENING TOOL     IDENTIFICATION  Patient Name: [name] Birthdate: September 23, 1955 Sex: male Admission Date (Current Location): 06/23/2022  [facility] and [place] Number:  Herbalist and Address:  [facility] [place] [address], [place], [place] [address]      Provider Number: [id number]  Attending Physician Name and Address:  Aileen Fass, Tammi Klippel, MD  Relative Name and Phone Number:       Current Level of Care: Hospital Recommended Level of Care: [place] Prior Approval Number:    Date Approved/Denied:   PASRR Number: [id number]  Discharge Plan: SNF    Current Diagnoses: Patient Active Problem List   Diagnosis Date Noted   Fall at home, initial encounter 06/24/2022   Hypokalemia 06/24/2022   HLD (hyperlipidemia) 06/24/2022   Closed nondisplaced fracture of first right metatarsal bone 06/23/2022   Hypertensive emergency 01/21/2022   GERD without esophagitis 01/21/2022   Coronary artery disease involving native coronary artery of native heart without angina pectoris 01/21/2022   Acute respiratory failure with hypoxia ([place]) 01/21/2022   Intolerance of continuous positive airway pressure (CPAP) ventilation 08/24/2021   Right lower lobe lung mass 05/13/2021   (HFpEF) heart failure with preserved ejection fraction ([place]) 05/11/2021   AKI (acute kidney injury) ([place])    Prolonged QT interval 04/17/2021   COPD exacerbation (HCC)    Noncompliance    Chronic diastolic CHF (congestive heart failure) ([place]) 03/22/2021   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   Chronic intermittent hypoxia with obstructive sleep apnea 12/22/2020   Mild cognitive impairment 12/22/2020   Acute combined systolic and diastolic congestive heart failure ([place]) 12/22/2020   Rotator cuff arthropathy, right 12/04/2020   Volume overload 12/04/2020   Shortness of breath 12/04/2020   Acute renal  failure superimposed on stage 3b chronic kidney disease ([place]) 11/19/2020   Acute respiratory failure ([place]) 11/19/2020   Bacteremia due to methicillin susceptible Staphylococcus aureus (MSSA) 11/19/2020   Cranial neuropathy    Acute encephalopathy 11/18/2020   Chronic right shoulder pain 06/18/2020   OSA (obstructive sleep apnea) 05/05/2020   Hypersomnia with sleep apnea 12/20/2019   Psychophysiological insomnia 09/18/2019   Nocturia more than twice per night 09/18/2019   Renal hypertension 09/18/2019   Moderate asthma 09/18/2019   Tobacco abuse 09/18/2019   Non-restorative sleep 09/18/2019   Nontraumatic complete tear of right rotator cuff 08/15/2019   Delirium 03/16/2019   Stage 3b chronic kidney disease    CVA (cerebral infarction)    Pseudarthrosis 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 02/28/2019   Intervertebral disc disorder with radiculopathy of lumbar region 09/13/2017   Acute gouty arthritis 03/07/2017   Arthralgia of left foot 03/07/2017   Nephrolithiasis    Fever, unspecified 11/02/2015   Essential hypertension 02/07/2015   Pulmonary embolism ([place]) 01/30/2013   Sarcoidosis 01/30/2013   Hemoptysis 01/30/2013   SHOULDER PAIN, LEFT 11/28/2009    Orientation RESPIRATION BLADDER Height & Weight     Self  Normal Continent Weight: 225 lb 12 oz (102.4 kg) Height:     BEHAVIORAL SYMPTOMS/MOOD NEUROLOGICAL BOWEL NUTRITION STATUS      Continent Diet (See dc summary)  AMBULATORY STATUS COMMUNICATION OF NEEDS Skin   Extensive Assist Verbally Other (Comment) (Blister near fx site)                       Personal Care Assistance Level of Assistance  Bathing, Feeding, Dressing Bathing Assistance:  Maximum assistance Feeding assistance: Independent Dressing Assistance: Limited assistance     Functional Limitations Info             SPECIAL CARE FACTORS FREQUENCY  PT (By licensed PT), OT (By licensed OT)     PT Frequency: 5x/week OT Frequency: 5x/week             Contractures Contractures Info: Not present    Additional Factors Info  Code Status, Allergies Code Status Info: Full Allergies Info: Codeine, Hydrochlorothiazide, Hydrocodone-acetaminophen, Hydromorphone           Current Medications (06/26/2022):  This is the current hospital active medication list Current Facility-Administered Medications  Medication Dose Route Frequency Provider Last Rate Last Admin   acetaminophen (TYLENOL) tablet 650 mg  650 mg Oral Q6H PRN Howerter, Justin B, DO   650 mg at 06/24/22 1532   Or   acetaminophen (TYLENOL) suppository 650 mg  650 mg Rectal Q6H PRN Howerter, Justin B, DO       apixaban (ELIQUIS) tablet 2.5 mg  2.5 mg Oral BID Dimple Nanas, RPH   2.5 mg at 06/26/22 8088   ascorbic acid (VITAMIN C) tablet 500 mg  500 mg Oral Daily Ainsley Spinner, PA-C   500 mg at 06/26/22 1103   aspirin EC tablet 81 mg  81 mg Oral Daily Charlynne Cousins, MD   81 mg at 06/26/22 0807   atorvastatin (LIPITOR) tablet 20 mg  20 mg Oral Daily Howerter, Justin B, DO   20 mg at 06/26/22 1594   cyclobenzaprine (FLEXERIL) tablet 5 mg  5 mg Oral TID PRN Ainsley Spinner, PA-C       gabapentin (NEURONTIN) capsule 100 mg  100 mg Oral BID Ainsley Spinner, PA-C   100 mg at 06/26/22 5859   metoprolol succinate (TOPROL-XL) 24 hr tablet 200 mg  200 mg Oral Daily Charlynne Cousins, MD   200 mg at 06/26/22 2924   naloxone [facility]) injection 0.4 mg  0.4 mg Intravenous PRN Howerter, Justin B, DO       oxyCODONE-acetaminophen (PERCOCET) 7.5-325 MG per tablet 1-2 tablet  1-2 tablet Oral Q4H PRN Charlynne Cousins, MD   1 tablet at 06/26/22 1140   polyethylene glycol (MIRALAX / GLYCOLAX) packet 17 g  17 g Oral BID Charlynne Cousins, MD   17 g at 06/26/22 1103   sacubitril-valsartan (ENTRESTO) 97-103 mg per tablet  1 tablet Oral BID Charlynne Cousins, MD   1 tablet at 06/26/22 1157   tamsulosin (FLOMAX) capsule 0.4 mg  0.4 mg Oral Daily Charlynne Cousins, MD   0.4 mg at 06/26/22  4628   traZODone (DESYREL) tablet 150 mg  150 mg Oral QHS PRN Charlynne Cousins, MD         Discharge Medications: Please see discharge summary for a list of discharge medications.  Relevant Imaging Results:  Relevant Lab Results:   Additional Information SSn: [place], [place]

## 2022-06-26 NOTE — TOC Progression Note (Signed)
Transition of Care [facility]) - Progression Note    Patient Details  Name: AMEDIO BOWLBY MRN: [id number] Date of Birth: Jan 13, 1956  [facility] CM/SW [place], LCSW Phone Number: 06/26/2022, 1:24 PM  Clinical Narrative:    CSW notes patient's wife's preference for SNF placement. Will send out referrals and provide bed offers as available.    Expected Discharge Plan: Skilled Nursing Facility Barriers to Discharge: Continued Medical Work up, Ship broker, SNF Pending bed offer  Expected Discharge Plan and Services Expected Discharge Plan: [place]   Discharge Planning Services: CM Consult Post Acute Care Choice: [place] Living arrangements for the past 2 months: Single Family Home                           HH Arranged: PT HH Agency: [place] Date [place]: 06/24/22 Time [place]: [place] (SDOH) Interventions    Readmission Risk Interventions    04/09/2021    9:45 AM  Readmission Risk Prevention Plan  Transportation Screening Complete  PCP or Specialist Appt within 3-5 Days Complete  HRI or [place] Complete  Social Work Consult for [place] Planning/Counseling Complete  Palliative Care Screening Not Applicable  Medication Review Press photographer) Complete

## 2022-06-26 NOTE — Plan of Care (Signed)

## 2022-06-26 NOTE — Plan of Care (Signed)

## 2022-06-26 NOTE — Progress Notes (Signed)
[facility] PROGRESS NOTE    Progress Note  [name]  NUU:[id number] DOB: March 17, 1956 DOA: 06/23/2022 PCP: [name], MD     Brief Narrative:   [name] is an 66 y.o. male 66 year old with past medical history of chronic kidney disease stage IIIb with a baseline creatinine of 3.4-7.4, chronic diastolic heart failure, hyperlipidemia comes in to [facility] complaining of right foot pain after a mechanical fall, CT of the right foot showed multiple metatarsal and midfoot fracture including the fourth second and third, fracture involving the Lisfranc ligament insertion, orthopedic surgery was consulted.    Assessment/Plan:   Closed nondisplaced fracture of first right metatarsal bone: Orthopedic surgery was consulted, continue narcotics for pain Avoid NSAIDs due to chronic renal disease. Pain she did use mostly oral narcotics yesterday but this morning required IV. Discontinue IV pain medication. Relates his pain not controlled with just oral analgesics. Orthopedic surgery recommended to follow-up with them as an outpatient as the foot is too swollen to proceed with surgical intervention. Physical therapy recommended home health PT. Started on aggressive bowel regimen.  Has not had a bowel movement over 48 hours  Chronic kidney stage IIIb: With a baseline creatinine of 1.7-2.2, on admission 2.3 creatinine appears to be at baseline. He did not come in and acute kidney injury.  Mechanical fall: Physical therapy to take weightbearing after surgical intervention physical therapy has been consulted post orthopedic surgery recommendations.  Hypokalemia: 3.4 was given potassium orally and hydrated. Was a clear orally recheck basic metabolic panel.  Chronic diastolic CHF (congestive heart failure) (HCC) Resume torsemide metoprolol diltiazem and Entresto. Hold hydralazine.  HLD (hyperlipidemia) Continue statins.    DVT prophylaxis: lovenox Family  Communication:none Status is: Observation The patient remains OBS appropriate and will d/c before 2 midnights.    Code Status:     Code Status Orders  (From admission, onward)           Start     Ordered   06/24/22 0218  Full code  Continuous        06/24/22 0218           Code Status History     Date Active Date Inactive Code Status Order ID Comments User Context   01/21/2022 0710 01/23/2022 2126 Full Code [id number]  Vernelle Emerald, MD ED   05/12/2021 0900 05/12/2021 1645 Full Code [id number]  [name], MD Inpatient   04/17/2021 2251 04/23/2021 1846 Full Code [id number]  Chotiner, Yevonne Aline, MD ED   04/07/2021 1653 04/09/2021 1548 Full Code [id number]  Lavina Hamman, MD ED   03/22/2021 0955 03/24/2021 1721 Full Code [id number]  Karmen Bongo, MD ED   11/19/2020 0058 11/22/2020 1913 Full Code [id number]  Rise Patience, MD ED   03/14/2019 2047 03/15/2019 1804 Full Code [id number]  Vashti Hey, MD Inpatient         IV Access:   Peripheral IV   Procedures and diagnostic studies:   DG Foot Complete Right  Result Date: 06/25/2022 CLINICAL DATA:  Fall EXAM: RIGHT FOOT COMPLETE - 3+ VIEW COMPARISON:  Two days ago FINDINGS: A splint has been placed. Displaced metatarsal neck fractures involving the second and third rays. The first, second, and third metatarsal show proximal fracturing with subluxation at the Lisfranc interval where there is avulsion fracture at the second metatarsal attachment of the Lisfranc ligament. Nondisplaced first proximal phalanx fracture reaching the first MTP joint. IMPRESSION: Unchanged alignment of multiple foot fractures which  are underestimated relative to preceding CT. Electronically Signed   By: Jorje Guild M.D.   On: 06/25/2022 07:44     Medical Consultants:   None.   Subjective:    [name] his pain is not controlled has not had a bowel movement in over 48 hours.  Objective:    Vitals:   06/25/22  2125 06/25/22 2139 06/26/22 0420 06/26/22 0800  BP: (!) 132/107 (!) 173/96  (!) 163/92  Pulse: 61   (!) 59  Resp: 16   18  Temp: 98.7 F (37.1 C)   99.4 F (37.4 C)  TempSrc: Oral   Oral  SpO2: 96%     Weight:   102.4 kg    SpO2: 96 %   Intake/Output Summary (Last 24 hours) at 06/26/2022 0827 Last data filed at 06/26/2022 0550 Gross per 24 hour  Intake 480 ml  Output 120 ml  Net 360 ml    Filed Weights   06/26/22 0420  Weight: 102.4 kg    Exam: General exam: In no acute distress. Respiratory system: Good air movement and clear to auscultation. Cardiovascular system: S1 & S2 heard, RRR. No JVD. Gastrointestinal system: Abdomen is nondistended, soft and nontender.  Extremities: No pedal edema. Skin: No rashes, lesions or ulcers Psychiatry: Judgement and insight appear normal. Mood & affect appropriate. Data Reviewed:    Labs: Basic Metabolic Panel: Recent Labs  Lab 06/24/22 0022 06/24/22 0250 06/25/22 0305 06/26/22 0303  NA 139 138 139 135  K 3.4* 3.1* 4.0 3.7  CL 101 102 99 102  CO2 '30 27 26 25  '$ GLUCOSE 94 91 102* 91  BUN 31* 28* 22 14  CREATININE 2.35* 2.36* 2.13* 1.71*  CALCIUM 9.3 9.0 9.1 8.8*  MG  --  2.2  --   --     GFR Estimated Creatinine Clearance: 53.3 mL/min (A) (by C-G formula based on SCr of 1.71 mg/dL (H)). Liver Function Tests: Recent Labs  Lab 06/24/22 0250  AST 61*  ALT 26  ALKPHOS 66  BILITOT 0.8  PROT 5.8*  ALBUMIN 3.3*    No results for input(s): "LIPASE", "AMYLASE" in the last 168 hours. No results for input(s): "AMMONIA" in the last 168 hours. Coagulation profile Recent Labs  Lab 06/24/22 0022  INR 1.1    COVID-19 Labs  No results for input(s): "DDIMER", "FERRITIN", "LDH", "CRP" in the last 72 hours.  Lab Results  Component Value Date   SARSCOV2NAA NEGATIVE 01/21/2022   SARSCOV2NAA NEGATIVE 04/17/2021   SARSCOV2NAA NEGATIVE 04/07/2021   [place] NEGATIVE 04/01/2021    CBC: Recent Labs  Lab  06/24/22 0022 06/24/22 0250  WBC 8.0 8.1  NEUTROABS  --  5.6  HGB 12.6* 12.1*  HCT 36.5* 35.6*  MCV 91.0 91.8  PLT 165 183    Cardiac Enzymes: No results for input(s): "CKTOTAL", "CKMB", "CKMBINDEX", "TROPONINI" in the last 168 hours. BNP (last 3 results) Recent Labs    09/21/21 0720 02/09/22 0713 02/23/22 0709  PROBNP 1,795* 2,662* 2,247*    CBG: Recent Labs  Lab 06/24/22 0807  GLUCAP 173*    D-Dimer: No results for input(s): "DDIMER" in the last 72 hours. Hgb A1c: No results for input(s): "HGBA1C" in the last 72 hours. Lipid Profile: No results for input(s): "CHOL", "HDL", "LDLCALC", "TRIG", "CHOLHDL", "LDLDIRECT" in the last 72 hours. Thyroid function studies: No results for input(s): "TSH", "T4TOTAL", "T3FREE", "THYROIDAB" in the last 72 hours.  Invalid input(s): "FREET3" Anemia work up: No results for input(s): "VITAMINB12", "  FOLATE", "FERRITIN", "TIBC", "IRON", "RETICCTPCT" in the last 72 hours. Sepsis Labs: Recent Labs  Lab 06/24/22 0022 06/24/22 0250  WBC 8.0 8.1    Microbiology No results found for this or any previous visit (from the past 240 hour(s)).   Medications:    apixaban  2.5 mg Oral BID   vitamin C  500 mg Oral Daily   aspirin EC  81 mg Oral Daily   atorvastatin  20 mg Oral Daily   gabapentin  100 mg Oral BID   metoprolol  200 mg Oral Daily   sacubitril-valsartan  1 tablet Oral BID   tamsulosin  0.4 mg Oral Daily   Continuous Infusions:    LOS: 0 days   Charlynne Cousins  [facility]  06/26/2022, 8:27 AM

## 2022-06-27 DIAGNOSIS — I5032 Chronic diastolic (congestive) heart failure: Secondary | ICD-10-CM | POA: Diagnosis not present

## 2022-06-27 DIAGNOSIS — E876 Hypokalemia: Secondary | ICD-10-CM | POA: Diagnosis not present

## 2022-06-27 DIAGNOSIS — S92314A Nondisplaced fracture of first metatarsal bone, right foot, initial encounter for closed fracture: Secondary | ICD-10-CM | POA: Diagnosis not present

## 2022-06-27 MED ORDER — SORBITOL 70 % SOLN
30.0000 mL | Freq: Once | Status: AC
  Administered 2022-06-27: 30 mL via ORAL
  Filled 2022-06-27: qty 30

## 2022-06-27 MED ORDER — POLYETHYLENE GLYCOL 3350 17 G PO PACK
17.0000 g | PACK | Freq: Two times a day (BID) | ORAL | Status: AC
  Administered 2022-06-27 – 2022-06-28 (×3): 17 g via ORAL
  Filled 2022-06-27 (×3): qty 1

## 2022-06-27 NOTE — Progress Notes (Signed)
Orthopedic Tech Progress Note Patient Details:  [name] 03-14-1956 [id number]  Ortho Devices Type of Ortho Device: Stirrup splint, Short leg splint Ortho Device/Splint Location: RLE Ortho Device/Splint Interventions: Ordered, Application, Adjustment   Post Interventions Patient Tolerated: Well Instructions Provided: Care of device Previous splint removed by MD Feliz due to it being soiled from blister drainage. Ortho PA made aware and a new splint was applied, I used heavy gauze and padding to help absorb the continuously draining blisters, and was assisted by an Therapist, sports during this process to help with holding the patients leg. Vernona Rieger 06/27/2022, 12:45 PM

## 2022-06-27 NOTE — TOC CAGE-AID Note (Signed)
Transition of Care [facility]) - CAGE-AID Screening   Patient Details  Name: [name] MRN: [id number] Date of Birth: 04-May-1956  [facility]) CM/SW Contact:    Army Melia, RN Phone Number: 06/27/2022, 12:30 AM    CAGE-AID Screening:    Have You Ever Felt You Ought to Cut Down on Your Drinking or Drug Use?: No Have People Annoyed You By [employer] Your Drinking Or Drug Use?: No Have You Felt Bad Or Guilty About Your Drinking Or Drug Use?: No Have You Ever Had a Drink or Used Drugs First Thing In The Morning to Steady Your Nerves or to Get Rid of a Hangover?: No CAGE-AID Score: 0  Substance Abuse Education Offered: No (no hx of drug or alcohol use, no resources indicated)

## 2022-06-27 NOTE — TOC Progression Note (Signed)
Transition of Care [facility]) - Progression Note    Patient Details  Name: [name] MRN: [id number] Date of Birth: 05-Mar-1956  Transition of Care [facility]) CM/SW Contact  Emeterio Reeve, [place] Phone Number: 06/27/2022, 1:31 PM  Clinical Narrative:     CSW gave bed offers to pt. CSW left offers on bedside table. CSW also called pts wife to let her know that bed offers have been left in the room.  Expected Discharge Plan: [place] Barriers to Discharge: Continued Medical Work up, Ship broker, SNF Pending bed offer  Expected Discharge Plan and Services Expected Discharge Plan: [place]   Discharge Planning Services: CM Consult Post Acute Care Choice: [place] Living arrangements for the past 2 months: Single Family Home Expected Discharge Date: 06/27/22                         HH Arranged: PT HH Agency: [place] Date [place]: 06/24/22 Time [place]: [id number]     Social Determinants of Health (SDOH) Interventions    Readmission Risk Interventions    04/09/2021    9:45 AM  Readmission Risk Prevention Plan  Transportation Screening Complete  PCP or Specialist Appt within 3-5 Days Complete  HRI or Retreat Complete  Social Work Consult for [place] Planning/Counseling Complete  Palliative Care Screening Not Applicable  Medication Review Press photographer) Complete   Emeterio Reeve, [place] Social Worker

## 2022-06-27 NOTE — Progress Notes (Incomplete)
[facility] PROGRESS NOTE    Progress Note  KASHON KRAYNAK  QHU:[id number] DOB: [birth year] DOA: 06/23/2022 PCP: [name], MD     Brief Narrative:   KEYTON BHAT is an 66 y.o. male 66 year old with past medical history of chronic kidney disease stage IIIb with a baseline creatinine of 4.6-5.6, chronic diastolic heart failure, hyperlipidemia comes in to [facility] complaining of right foot pain after a mechanical fall, CT of the right foot showed multiple metatarsal and midfoot fracture including the fourth second and third, fracture involving the Lisfranc ligament insertion, orthopedic surgery was consulted.    Assessment/Plan:   Closed nondisplaced fracture of first right metatarsal bone: Orthopedic surgery was consulted, continue narcotics for pain Continue oral narcotics. Orthopedic surgery recommended to follow-up with them as an outpatient as the foot is too swollen to proceed with surgical intervention. Physical therapy recommended skilled nursing facility.  Chronic kidney stage IIIb: With a baseline creatinine of 1.7-2.2, on admission 2.3 creatinine appears to be at baseline. He did not come in and acute kidney injury.  Mechanical fall: Physical therapy to take weightbearing after surgical intervention physical therapy has been consulted post orthopedic surgery recommendations.  Hypokalemia: Replete orally now resolved.  Chronic diastolic CHF (congestive heart failure) (HCC) Resume torsemide metoprolol diltiazem and Entresto. Resume hydralazine continue to hold Imdur.  HLD (hyperlipidemia) Continue statins.    DVT prophylaxis: lovenox Family Communication:none Status is: Observation The patient remains OBS appropriate and will d/c before 2 midnights.    Code Status:     Code Status Orders  (From admission, onward)           Start     Ordered   06/24/22 0218  Full code  Continuous        06/24/22 0218           Code Status History      Date Active Date Inactive Code Status Order ID Comments User Context   01/21/2022 0710 01/23/2022 2126 Full Code [id number]  Vernelle Emerald, MD ED   05/12/2021 0900 05/12/2021 1645 Full Code [id number]  [name], MD Inpatient   04/17/2021 2251 04/23/2021 1846 Full Code [id number]  Chotiner, Yevonne Aline, MD ED   04/07/2021 1653 04/09/2021 1548 Full Code [id number]  Lavina Hamman, MD ED   03/22/2021 0955 03/24/2021 1721 Full Code [id number]  Karmen Bongo, MD ED   11/19/2020 0058 11/22/2020 1913 Full Code [id number]  Rise Patience, MD ED   03/14/2019 2047 03/15/2019 1804 Full Code [id number]  Vashti Hey, MD Inpatient         IV Access:   Peripheral IV   Procedures and diagnostic studies:   No results found.   Medical Consultants:   None.   Subjective:    LEXX MONTE relates Flexeril is not working for his pain, had a bowel movement yesterday  Objective:    Vitals:   06/26/22 1946 06/26/22 2028 06/27/22 0403 06/27/22 0833  BP: (!) 173/97 (!) 161/101 (!) 179/93 (!) 158/96  Pulse: 61 (!) 59 (!) 56 65  Resp: '18 18 17 17  '$ Temp: 98.2 F (36.8 C)  98.2 F (36.8 C) 99.2 F (37.3 C)  TempSrc:    Oral  SpO2: 97% 96% 99% 95%  Weight:       SpO2: 95 %  No intake or output data in the 24 hours ending 06/27/22 1012  Filed Weights   06/26/22 0420  Weight: 102.4 kg    Exam: General  exam: In no acute distress. Respiratory system: Good air movement and clear to auscultation. Cardiovascular system: S1 & S2 heard, RRR. No JVD. Gastrointestinal system: Abdomen is nondistended, soft and nontender.  Extremities: No pedal edema. Skin: No rashes, lesions or ulcers Psychiatry: Judgement and insight appear normal. Mood & affect appropriate. Data Reviewed:    Labs: Basic Metabolic Panel: Recent Labs  Lab 06/24/22 0022 06/24/22 0250 06/25/22 0305 06/26/22 0303  NA 139 138 139 135  K 3.4* 3.1* 4.0 3.7  CL 101 102 99 102  CO2 '30 27 26 25   '$ GLUCOSE 94 91 102* 91  BUN 31* 28* 22 14  CREATININE 2.35* 2.36* 2.13* 1.71*  CALCIUM 9.3 9.0 9.1 8.8*  MG  --  2.2  --   --     GFR Estimated Creatinine Clearance: 53.3 mL/min (A) (by C-G formula based on SCr of 1.71 mg/dL (H)). Liver Function Tests: Recent Labs  Lab 06/24/22 0250  AST 61*  ALT 26  ALKPHOS 66  BILITOT 0.8  PROT 5.8*  ALBUMIN 3.3*    No results for input(s): "LIPASE", "AMYLASE" in the last 168 hours. No results for input(s): "AMMONIA" in the last 168 hours. Coagulation profile Recent Labs  Lab 06/24/22 0022  INR 1.1    COVID-19 Labs  No results for input(s): "DDIMER", "FERRITIN", "LDH", "CRP" in the last 72 hours.  Lab Results  Component Value Date   SARSCOV2NAA NEGATIVE 01/21/2022   SARSCOV2NAA NEGATIVE 04/17/2021   SARSCOV2NAA NEGATIVE 04/07/2021   [place] NEGATIVE 04/01/2021    CBC: Recent Labs  Lab 06/24/22 0022 06/24/22 0250  WBC 8.0 8.1  NEUTROABS  --  5.6  HGB 12.6* 12.1*  HCT 36.5* 35.6*  MCV 91.0 91.8  PLT 165 183    Cardiac Enzymes: No results for input(s): "CKTOTAL", "CKMB", "CKMBINDEX", "TROPONINI" in the last 168 hours. BNP (last 3 results) Recent Labs    09/21/21 0720 02/09/22 0713 02/23/22 0709  PROBNP 1,795* 2,662* 2,247*    CBG: Recent Labs  Lab 06/24/22 0807  GLUCAP 173*    D-Dimer: No results for input(s): "DDIMER" in the last 72 hours. Hgb A1c: No results for input(s): "HGBA1C" in the last 72 hours. Lipid Profile: No results for input(s): "CHOL", "HDL", "LDLCALC", "TRIG", "CHOLHDL", "LDLDIRECT" in the last 72 hours. Thyroid function studies: No results for input(s): "TSH", "T4TOTAL", "T3FREE", "THYROIDAB" in the last 72 hours.  Invalid input(s): "FREET3" Anemia work up: No results for input(s): "VITAMINB12", "FOLATE", "FERRITIN", "TIBC", "IRON", "RETICCTPCT" in the last 72 hours. Sepsis Labs: Recent Labs  Lab 06/24/22 0022 06/24/22 0250  WBC 8.0 8.1    Microbiology No results found  for this or any previous visit (from the past 240 hour(s)).   Medications:    apixaban  2.5 mg Oral BID   vitamin C  500 mg Oral Daily   aspirin EC  81 mg Oral Daily   atorvastatin  20 mg Oral Daily   gabapentin  100 mg Oral BID   metoprolol  200 mg Oral Daily   polyethylene glycol  17 g Oral BID   sacubitril-valsartan  1 tablet Oral BID   tamsulosin  0.4 mg Oral Daily   Continuous Infusions:    LOS: 0 days   Charlynne Cousins  [facility]  06/27/2022, 10:12 AM

## 2022-06-27 NOTE — Discharge Summary (Signed)
Physician Discharge Summary  [name] GNF:[id number] DOB: 1955/09/26 DOA: 06/23/2022  PCP: [name], MD  Admit date: 06/23/2022 Discharge date: 06/27/2022  Admitted From: Home Disposition:  Home  Recommendations for Outpatient Follow-up:  Follow up with PCP in 1-2 weeks Please obtain BMP/CBC in one week   Home Health:No Equipment/Devices:None  Discharge Condition:Stable CODE STATUS:Full Diet recommendation: Heart Healthy   Brief/Interim Summary: 66 year old with past medical history of chronic kidney disease stage IIIb with a baseline creatinine of 8.4-6.9, chronic diastolic heart failure, hyperlipidemia comes in to [facility] complaining of right foot pain after a mechanical fall, CT of the right foot showed multiple metatarsal and midfoot fracture including the fourth second and third, fracture involving the Lisfranc ligament insertion, orthopedic surgery was consulted.   Discharge Diagnoses:  Principal Problem:   Closed nondisplaced fracture of first right metatarsal bone Active Problems:   Chronic diastolic CHF (congestive heart failure) (HCC)   Acute renal failure superimposed on stage 3b chronic kidney disease ([place])   Fall at home, initial encounter   Hypokalemia   HLD (hyperlipidemia)  Closed nondisplaced fracture of the right metatarsal bone: Orthopedic surgery was consulted and recommended narcotics splint for pain control. He was started on IV pain medications and switch to oral which she was able to tolerate. He was given MiraLAX for constipation. Orthopedic surgery recommended follow-up with them as an outpatient of the foot is too swollen to proceed with surgical intervention. Physical therapy evaluated the patient recommended home health PT.  Chronic kidney stage IIIb: Avoid NSAIDs and remain at baseline.  Mechanical fall: Physical therapy evaluated the patient Ortho recommended weightbearing as tolerated.  Hypokalemia: He was repleted orally  now resolved.  Chronic diastolic heart failure: No changes made to his medication.  Hyperlipidemia: Continue statins.  Discharge Instructions  Discharge Instructions     Diet - low sodium heart healthy   Complete by: As directed    Increase activity slowly   Complete by: As directed       Allergies as of 06/27/2022       Reactions   Codeine Nausea And Vomiting   Hydrochlorothiazide Other (See Comments)   Unknown per Pt.    Hydrocodone-acetaminophen Nausea And Vomiting   Hydromorphone Other (See Comments)   hallucinations         Medication List     TAKE these medications    apixaban 2.5 MG Tabs tablet Commonly known as: ELIQUIS Take 1 tablet (2.5 mg total) by mouth 2 (two) times daily.   ascorbic acid 500 MG tablet Commonly known as: VITAMIN C Take 1 tablet (500 mg total) by mouth daily.   aspirin 81 MG tablet Take 1 tablet (81 mg total) by mouth daily.   atorvastatin 20 MG tablet Commonly known as: LIPITOR Take 20 mg by mouth daily.   cyclobenzaprine 5 MG tablet Commonly known as: FLEXERIL Take 1 tablet (5 mg total) by mouth 3 (three) times daily as needed for muscle spasms.   diazepam 5 MG tablet Commonly known as: Valium Take 1-2 tabs 60 minutes before procedure as needed (if having injection in office then take at office. do not take prior to arrival)   diltiazem 360 MG 24 hr capsule Commonly known as: TIAZAC TAKE ONE CAPSULE BY MOUTH EVERY DAY   Entresto 97-103 MG Generic drug: sacubitril-valsartan Take 1 tablet by mouth 2 (two) times daily.   Farxiga 10 MG Tabs tablet Generic drug: dapagliflozin propanediol Take 1 tablet (10 mg total) by mouth daily.  fluticasone 50 MCG/ACT nasal spray Commonly known as: FLONASE Place 1 spray into both nostrils at bedtime as needed for allergies or rhinitis.   gabapentin 100 MG capsule Commonly known as: NEURONTIN Take 1 capsule (100 mg total) by mouth 2 (two) times daily.   guaifenesin 400 MG  Tabs tablet Commonly known as: HUMIBID E Take 400 mg by mouth every 4 (four) hours.   hydrALAZINE 100 MG tablet Commonly known as: APRESOLINE Take 1 tablet (100 mg total) by mouth 3 (three) times daily.   isosorbide mononitrate 30 MG 24 hr tablet Commonly known as: IMDUR Take 30 mg by mouth in the morning and at bedtime.   isosorbide mononitrate 120 MG 24 hr tablet Commonly known as: IMDUR Take 1 tablet (120 mg total) by mouth daily.   lidocaine 5 % Commonly known as: Lidoderm Apply 1 patch to skin every day (May wear up to 12hours.)   metoprolol 200 MG 24 hr tablet Commonly known as: TOPROL-XL Take 1 tablet (200 mg total) by mouth daily.   nicotine 21 mg/24hr patch Commonly known as: Nicotine Step 1 Place 1 patch (21 mg total) onto the skin daily.   nicotine polacrilex 4 MG lozenge Commonly known as: COMMIT Take 1 lozenge (4 mg total) by mouth as needed for up to 90 doses for smoking cessation.   oxyCODONE-acetaminophen 7.5-325 MG tablet Commonly known as: PERCOCET Take 1-2 tablets by mouth every 6 (six) hours as needed for severe pain.   potassium chloride SA 20 MEQ tablet Commonly known as: KLOR-CON M Take 1 tablet by mouth daily.   Probiotic Digestive Supp Caps Take 1 capsule by mouth daily.   tamsulosin 0.4 MG Caps capsule Commonly known as: Flomax Take 1 capsule by mouth daily - need appointment for more refills What changed:  how much to take when to take this   torsemide 20 MG tablet Commonly known as: DEMADEX Take 1 tablet (20 mg total) by mouth daily.   traZODone 150 MG tablet Commonly known as: DESYREL Take 1 tablet (150 mg total) by mouth at bedtime as needed for sleep. What changed: when to take this   triamcinolone cream 0.1 % Commonly known as: KENALOG Apply to affected areas twice a day as needed for itching/inflammation. What changed:  how much to take when to take this reasons to take this   Vitamin D 125 MCG (5000 UT) Caps Take  5,000 Units by mouth daily.               Durable Medical Equipment  (From admission, onward)           Start     Ordered   06/24/22 1240  For home use only DME standard manual wheelchair with seat cushion  Once       Comments: Patient suffers from right lisfranc fracture which impairs their ability to perform daily activities like bathing, dressing, and toileting in the home.  A cane, crutch, or walker will not resolve issue with performing activities of daily living. A wheelchair will allow patient to safely perform daily activities. Patient can safely propel the wheelchair in the home or has a caregiver who can provide assistance. Length of need 6 months . Accessories: elevating leg rests (ELRs), wheel locks, extensions and anti-tippers.   06/24/22 1239   06/24/22 1239  For home use only DME Walker  Once       Question:  Patient needs a walker to treat with the following condition  Answer:  Difficulty  walking   06/24/22 1239   06/24/22 1239  For home use only DME 3 n 1  Once        06/24/22 1239            Follow-up Information     [name], MD. Schedule an appointment as soon as possible for a visit.   Specialty: Internal Medicine Contact information: [place] [address] [place], [place] Follow up.   Specialty: [facility] Contact information: [address] [name] [address] [phone]         Altamese [place] [address], MD. Schedule an appointment as soon as possible for a visit on 06/30/2022.   Specialty: Orthopedic Surgery Contact information: [name] [address] [phone]                Allergies  Allergen Reactions   Codeine Nausea And Vomiting   Hydrochlorothiazide Other (See Comments)    Unknown per Pt.    Hydrocodone-Acetaminophen Nausea And Vomiting   Hydromorphone Other (See Comments)    hallucinations     Consultations: Orthopedics  during   Procedures/Studies: DG Foot Complete Right  Result Date: 06/25/2022 CLINICAL DATA:  Fall EXAM: RIGHT FOOT COMPLETE - 3+ VIEW COMPARISON:  Two days ago FINDINGS: A splint has been placed. Displaced metatarsal neck fractures involving the second and third rays. The first, second, and third metatarsal show proximal fracturing with subluxation at the Lisfranc interval where there is avulsion fracture at the second metatarsal attachment of the Lisfranc ligament. Nondisplaced first proximal phalanx fracture reaching the first MTP joint. IMPRESSION: Unchanged alignment of multiple foot fractures which are underestimated relative to preceding CT. Electronically Signed   By: Jorje Guild M.D.   On: 06/25/2022 07:44   CT Foot Right Wo Contrast  Result Date: 06/23/2022 CLINICAL DATA:  Foot trauma, occult fracture suspected, xray equivocal (Age >= 6y) EXAM: CT OF THE RIGHT FOOT WITHOUT CONTRAST TECHNIQUE: Multidetector CT imaging of the right foot was performed according to the standard protocol. Multiplanar CT image reconstructions were also generated. RADIATION DOSE REDUCTION: This exam was performed according to the departmental dose-optimization program which includes automated exposure control, adjustment of the mA and/or kV according to patient size and/or use of iterative reconstruction technique. COMPARISON:  Radiograph earlier today. FINDINGS: Bones/Joint/Cartilage Multiple metatarsal and midfoot fractures. The second and third metatarsal fractures are segmental. This includes a mildly comminuted and displaced fracture involving the base of the first metatarsal extending into the tarsal metatarsal joint. Comminuted fracture involving the base of the second metatarsal with mild displacement extending into the tarsal metatarsal joint. Fracture involves the region of the Lisfranc ligament insertion which is poorly defined. There is a displaced mildly comminuted fracture of the second metatarsal  neck. Tiny fractures involving the anterolateral cuneiform and base of the third metatarsal. Displaced minimally comminuted distal third metatarsal neck fracture. Mildly comminuted intra-articular fourth proximal metatarsal fracture with intra-articular involvement. Nondisplaced mildly comminuted first proximal phalanx fracture. Ligaments The Lisfranc ligament is poorly defined. Muscles and Tendons Ill-defined musculature involving the intrinsic musculature of the foot likely represent intramuscular hemorrhage. There is no evidence of tendinous entrapment at the ankle. Soft tissues Prominent dorsal and lesser plantar soft tissue edema. IMPRESSION: 1. Multiple metatarsal and midfoot fractures including the first through fourth proximal metatarsals, the second and third metatarsal fractures are segmental with additional fractures of the distal metatarsal necks. 2. Fractures involve  the Lisfranc ligament insertion which is poorly defined and likely torn. 3. Tiny fracture from the lateral cuneiform. 4. Comminuted first proximal phalanx fracture. Electronically Signed   By: Keith Rake M.D.   On: 06/23/2022 21:09   CT Cervical Spine Wo Contrast  Result Date: 06/23/2022 CLINICAL DATA:  Status post trauma. EXAM: CT CERVICAL SPINE WITHOUT CONTRAST TECHNIQUE: Multidetector CT imaging of the cervical spine was performed without intravenous contrast. Multiplanar CT image reconstructions were also generated. RADIATION DOSE REDUCTION: This exam was performed according to the departmental dose-optimization program which includes automated exposure control, adjustment of the mA and/or kV according to patient size and/or use of iterative reconstruction technique. COMPARISON:  None Available. FINDINGS: Alignment: There is reversal of the normal cervical spine lordosis. Skull base and vertebrae: No acute fracture. No primary bone lesion or focal pathologic process. Soft tissues and spinal canal: No prevertebral fluid or  swelling. No visible canal hematoma. Disc levels: Moderate severity endplate sclerosis, marked severity anterior osteophyte formation and mild posterior bony spurring are seen at the levels of C3-C4, C4-C5, C5-C6 and C6-C7. Marked severity intervertebral disc space narrowing is seen at C3-C4, C5-C6 and C6-C7. Bilateral moderate severity multilevel facet joint hypertrophy is noted. Upper chest: Negative. Other: None. IMPRESSION: 1. No acute fracture or subluxation in the cervical spine. 2. Moderate to marked severity multilevel degenerative changes, as described above. Electronically Signed   By: Virgina Norfolk M.D.   On: 06/23/2022 20:43   CT Head Wo Contrast  Result Date: 06/23/2022 CLINICAL DATA:  Status post fall. EXAM: CT HEAD WITHOUT CONTRAST TECHNIQUE: Contiguous axial images were obtained from the base of the skull through the vertex without intravenous contrast. RADIATION DOSE REDUCTION: This exam was performed according to the departmental dose-optimization program which includes automated exposure control, adjustment of the mA and/or kV according to patient size and/or use of iterative reconstruction technique. COMPARISON:  November 18, 2020 FINDINGS: Brain: There is mild cerebral atrophy with widening of the extra-axial spaces and ventricular dilatation. There are areas of decreased attenuation within the white matter tracts of the supratentorial brain, consistent with microvascular disease changes. Vascular: No hyperdense vessel or unexpected calcification. Skull: Normal. Negative for fracture or focal lesion. Sinuses/Orbits: Very mild bilateral ethmoid sinus mucosal thickening is noted. Other: None. IMPRESSION: 1. No acute intracranial abnormality. 2. Generalized cerebral atrophy and microvascular disease changes of the supratentorial brain. Electronically Signed   By: Virgina Norfolk M.D.   On: 06/23/2022 20:42   DG Foot Complete Right  Result Date: 06/23/2022 CLINICAL DATA:  Trauma, fall  EXAM: RIGHT FOOT COMPLETE - 3+ VIEW COMPARISON:  None Available. FINDINGS: There is comminuted undisplaced fracture in the base and shaft of proximal phalanx of big toe. There is comminuted fracture in the necks of right second and third metatarsals with medial displacement of distal fracture fragments. There is a proximally 5 mm distraction of fracture fragments. There is faint lucency in the medial aspect of head of the fourth metacarpal seen in the AP view which may be a normal variation or suggest undisplaced fracture. There is comminuted fracture in the base of first metatarsal. Comminuted fracture is seen in the base of second metatarsal. As far as seen, there is no significant medial displacement of base of the first metatarsal or lateral displacement of bases of the third, fourth and fifth metatarsals. There is widening of space between bases of the first and second metatarsals. There is offset in alignment of distal articular surfaces of medial and middle  cuneiform. There is marked soft tissue swelling over the dorsum of the foot. IMPRESSION: Comminuted undisplaced fracture is seen in the proximal phalanx of right big toe. Comminuted displaced fractures are seen in the distal portions of right second and third metatarsals. Comminuted fractures are seen in bases of first and second metatarsals. Possibility of Lisfranc type injury should be considered. Follow-up CT as clinically warranted may be considered. Electronically Signed   By: Elmer Picker M.D.   On: 06/23/2022 19:52   MR Lumbar Spine W Wo Contrast  Result Date: 06/14/2022 CLINICAL DATA:  Chronic low back pain with bilateral leg weakness. EXAM: MRI LUMBAR SPINE WITHOUT AND WITH CONTRAST TECHNIQUE: Multiplanar and multiecho pulse sequences of the lumbar spine were obtained without and with intravenous contrast. CONTRAST:  87m MULTIHANCE GADOBENATE DIMEGLUMINE 529 MG/ML IV SOLN COMPARISON:  11/13/2016 FINDINGS: Segmentation:  5 lumbar type  vertebral bodies. Alignment:  2 mm degenerative retrolisthesis at L1-2. Vertebrae:  Distant discectomy and fusion from L3 through L5. Conus medullaris and cauda equina: Conus extends to the L1 level. Conus and cauda equina appear normal. Paraspinal and other soft tissues: Negative Disc levels: T12-L1 and L1-L2: Normal L2-3: Development of some adjacent segment degenerative changes. 2 mm retrolisthesis. Bulging of the disc. Facet degeneration with facet and ligamentous hypertrophy and joint effusions. Small developing synovial cyst on the left. Mild to moderate multifactorial stenosis at this level that could worsen with standing or flexion. Abnormal motion would be likely. L3 through L5: Previous discectomy and fusion. Solid union with wide patency of the canal and foramina. L5-S1: Mild annular bulging. No compressive narrowing of the canal or foramina. Mild facet osteoarthritis. Similar appearance to the prior exam. IMPRESSION: Previous discectomy and fusion from L3 through L5 has a good appearance with solid union and wide patency of the canal and foramina. Adjacent segment degenerative disease now present at the L2-3 level. 2 mm retrolisthesis. Bulging of the disc. Facet degeneration with facet and ligamentous hypertrophy, joint effusions and a small developing synovial cyst on the left. Stenosis at this level could be symptomatic and could worsen with standing or bending. Electronically Signed   By: MNelson ChimesM.D.   On: 06/14/2022 15:12   (Echo, Carotid, EGD, Colonoscopy, ERCP)    Subjective: No complaints  Discharge Exam: Vitals:   06/27/22 0403 06/27/22 0833  BP: (!) 179/93 (!) 158/96  Pulse: (!) 56 65  Resp: 17 17  Temp: 98.2 F (36.8 C) 99.2 F (37.3 C)  SpO2: 99% 95%   Vitals:   06/26/22 1946 06/26/22 2028 06/27/22 0403 06/27/22 0833  BP: (!) 173/97 (!) 161/101 (!) 179/93 (!) 158/96  Pulse: 61 (!) 59 (!) 56 65  Resp: '18 18 17 17  '$ Temp: 98.2 F (36.8 C)  98.2 F (36.8 C) 99.2 F  (37.3 C)  TempSrc:    Oral  SpO2: 97% 96% 99% 95%  Weight:        General: Pt is alert, awake, not in acute distress Cardiovascular: RRR, S1/S2 +, no rubs, no gallops Respiratory: CTA bilaterally, no wheezing, no rhonchi Abdominal: Soft, NT, ND, bowel sounds + Extremities: no edema, no cyanosis    The results of significant diagnostics from this hospitalization (including imaging, microbiology, ancillary and laboratory) are listed below for reference.     Microbiology: No results found for this or any previous visit (from the past 240 hour(s)).   Labs: BNP (last 3 results) Recent Labs    01/20/22 2352 06/24/22 0250  BNP >4,500.0* 263.9*  Basic Metabolic Panel: Recent Labs  Lab 06/24/22 0022 06/24/22 0250 06/25/22 0305 06/26/22 0303  NA 139 138 139 135  K 3.4* 3.1* 4.0 3.7  CL 101 102 99 102  CO2 '30 27 26 25  '$ GLUCOSE 94 91 102* 91  BUN 31* 28* 22 14  CREATININE 2.35* 2.36* 2.13* 1.71*  CALCIUM 9.3 9.0 9.1 8.8*  MG  --  2.2  --   --    Liver Function Tests: Recent Labs  Lab 06/24/22 0250  AST 61*  ALT 26  ALKPHOS 66  BILITOT 0.8  PROT 5.8*  ALBUMIN 3.3*   No results for input(s): "LIPASE", "AMYLASE" in the last 168 hours. No results for input(s): "AMMONIA" in the last 168 hours. CBC: Recent Labs  Lab 06/24/22 0022 06/24/22 0250  WBC 8.0 8.1  NEUTROABS  --  5.6  HGB 12.6* 12.1*  HCT 36.5* 35.6*  MCV 91.0 91.8  PLT 165 183   Cardiac Enzymes: No results for input(s): "CKTOTAL", "CKMB", "CKMBINDEX", "TROPONINI" in the last 168 hours. BNP: Invalid input(s): "POCBNP" CBG: Recent Labs  Lab 06/24/22 0807  GLUCAP 173*   D-Dimer No results for input(s): "DDIMER" in the last 72 hours. Hgb A1c No results for input(s): "HGBA1C" in the last 72 hours. Lipid Profile No results for input(s): "CHOL", "HDL", "LDLCALC", "TRIG", "CHOLHDL", "LDLDIRECT" in the last 72 hours. Thyroid function studies No results for input(s): "TSH", "T4TOTAL", "T3FREE",  "THYROIDAB" in the last 72 hours.  Invalid input(s): "FREET3" Anemia work up No results for input(s): "VITAMINB12", "FOLATE", "FERRITIN", "TIBC", "IRON", "RETICCTPCT" in the last 72 hours. Urinalysis    Component Value Date/Time   COLORURINE YELLOW 06/26/2022 0253   APPEARANCEUR CLEAR 06/26/2022 0253   LABSPEC 1.014 06/26/2022 0253   PHURINE 6.0 06/26/2022 0253   GLUCOSEU 50 (A) 06/26/2022 0253   HGBUR SMALL (A) 06/26/2022 0253   BILIRUBINUR NEGATIVE 06/26/2022 0253   KETONESUR NEGATIVE 06/26/2022 0253   PROTEINUR 100 (A) 06/26/2022 0253   UROBILINOGEN 0.2 11/16/2012 1004   NITRITE NEGATIVE 06/26/2022 0253   LEUKOCYTESUR NEGATIVE 06/26/2022 0253   Sepsis Labs Recent Labs  Lab 06/24/22 0022 06/24/22 0250  WBC 8.0 8.1   Microbiology No results found for this or any previous visit (from the past 240 hour(s)).   SIGNED:   Charlynne Cousins, MD  [facility] 06/27/2022, 10:21 AM Pager   If 7PM-7AM, please contact night-coverage [email or web address] Password [email or web address]

## 2022-06-27 NOTE — Progress Notes (Signed)
Pt. Dressing and splint to LLE removed due to excessive drainage per Dr. Aileen Fass,  noted large blister,Ortho Tech consulted for redressing

## 2022-06-27 NOTE — Plan of Care (Signed)

## 2022-06-28 DIAGNOSIS — E876 Hypokalemia: Secondary | ICD-10-CM | POA: Diagnosis not present

## 2022-06-28 DIAGNOSIS — S92314A Nondisplaced fracture of first metatarsal bone, right foot, initial encounter for closed fracture: Secondary | ICD-10-CM | POA: Diagnosis not present

## 2022-06-28 DIAGNOSIS — I5032 Chronic diastolic (congestive) heart failure: Secondary | ICD-10-CM | POA: Diagnosis not present

## 2022-06-28 MED ORDER — OXYCODONE-ACETAMINOPHEN 7.5-325 MG PO TABS
2.0000 | ORAL_TABLET | ORAL | Status: DC | PRN
  Administered 2022-06-28 – 2022-06-29 (×5): 2 via ORAL
  Filled 2022-06-28 (×5): qty 2

## 2022-06-28 MED ORDER — TORSEMIDE 20 MG PO TABS
20.0000 mg | ORAL_TABLET | Freq: Every day | ORAL | Status: DC
  Administered 2022-06-28 – 2022-06-29 (×2): 20 mg via ORAL
  Filled 2022-06-28 (×2): qty 1

## 2022-06-28 MED ORDER — POTASSIUM CHLORIDE CRYS ER 20 MEQ PO TBCR
20.0000 meq | EXTENDED_RELEASE_TABLET | Freq: Every day | ORAL | Status: DC
  Administered 2022-06-28 – 2022-06-29 (×2): 20 meq via ORAL
  Filled 2022-06-28 (×2): qty 1

## 2022-06-28 MED ORDER — HYDRALAZINE HCL 50 MG PO TABS
100.0000 mg | ORAL_TABLET | Freq: Three times a day (TID) | ORAL | Status: DC
  Administered 2022-06-28 – 2022-06-29 (×4): 100 mg via ORAL
  Filled 2022-06-28 (×4): qty 2

## 2022-06-28 MED ORDER — DAPAGLIFLOZIN PROPANEDIOL 10 MG PO TABS
10.0000 mg | ORAL_TABLET | Freq: Every day | ORAL | Status: DC
  Administered 2022-06-28 – 2022-06-29 (×2): 10 mg via ORAL
  Filled 2022-06-28 (×3): qty 1

## 2022-06-28 NOTE — Progress Notes (Signed)
Orthopedic Tech Progress Note Patient Details:  [name] 22-Apr-1956 [id number]  Patient ID: [name], male   DOB: July 05, 1956, 66 y.o.   MRN: [id number] I was called by the rn to help open the splint so a dressing change could be done. I did and then rewrapped the splint. Karolee Stamps 06/28/2022, 9:15 PM

## 2022-06-28 NOTE — Progress Notes (Signed)
Called orthotech to come and change pt right foot dressing as it had bled through it, he called back and said will be here to check it out as soon as he is done with another patient, will continue to monitor

## 2022-06-28 NOTE — Progress Notes (Addendum)
Mobility Specialist Progress Note  Pre Mobility: HR, BP, SpO2 During Mobility: HR, BP, SpO2 Post Mobility: HR, BP, SpO2  06/28/22 1405  Mobility  Activity Transferred from chair to bed  Level of Assistance +2 (takes two people) ([place])  Games developer wheel walker  Distance Ambulated (ft) 4 ft  RLE Weight Bearing NWB  Activity Response Tolerated well  $Mobility charge 1 Mobility   RN requesting assistance to get pt from chair to bed. modA +2 for initial stand out of chair but minA for pivot steps while attempting to maintain NWB precautions, no faults during transfer. Pt reminded of WB precautions and its importance post mobility. Left in bed with all needs met, call bell in reach and RN in room.   Holland Falling Mobility Specialist Acute Rehab Office:  [phone]

## 2022-06-28 NOTE — TOC Progression Note (Addendum)
Transition of Care [facility]) - Progression Note    Patient Details  Name: BRYLIN STANISLAWSKI MRN: [id number] Date of Birth: [birth year]  [facility] CM/SW Contact  Joanne Chars, LCSW Phone Number: 06/28/2022, 9:58 AM  Clinical Narrative:  SNF auth request made to [facility].    0930: CSW spoke with pt regarding bed offers.  He is asking for additional responses from higher rated facilities.  CSW reached out to several.    0945: message from Connie/[facility].  They need new PT note for auth request.  PT aware.    1415: [facility] bed offer.  CSW discussed with pt again, he would like to accept offer at [facility].    CSW spoke with Connie/[facility].  Auth approved: [id number].  Ambulance approved: [id number].  CSW spoke with Kitty[facility] and she can accept pt today.    Expected Discharge Plan: Skilled Nursing Facility Barriers to Discharge: Continued Medical Work up, Ship broker, SNF Pending bed offer  Expected Discharge Plan and Services Expected Discharge Plan: [place]   Discharge Planning Services: CM Consult Post Acute Care Choice: [place] Living arrangements for the past 2 months: Single Family Home Expected Discharge Date: 06/27/22                         HH Arranged: PT HH Agency: [facility] Date [place]: 06/24/22 Time [place]: [id number]     Social Determinants of Health (SDOH) Interventions    Readmission Risk Interventions    04/09/2021    9:45 AM  Readmission Risk Prevention Plan  Transportation Screening Complete  PCP or Specialist Appt within 3-5 Days Complete  HRI or [place] Complete  Social Work Consult for [place] Planning/Counseling Complete  Palliative Care Screening Not Applicable  Medication Review Press photographer) Complete

## 2022-06-28 NOTE — Progress Notes (Signed)
Physical Therapy Treatment Patient Details Name: [name] MRN: [id number] DOB: 08/13/1956 Today's Date: 06/28/2022   History of Present Illness 66 y/o presented to ED on 06/23/22 after fall. Sustained Lisfranc fracture on R foot. Unable to proceed with surgery at this time due to swelling. PMH: diastolic CHF, sarcoidosis, hypertension, hyperlipidemia, history of CVA, CKD stage IIIb.    PT Comments    Pt progressing towards his physical therapy goals and is agreeable to participate. Session focused on transfer training and continued reinforcement of weightbearing precautions. Pt requiring moderate assist for stand pivot transfers using RW. Continues to display RLE weakness, R foot pain, impaired skin integrity, poor standing balance. Presents as a high fall risk based on history of falls and decreased safety awareness. Recommend ST SNF to address deficits, maximize functional mobility and decrease caregiver burden.     Recommendations for follow up therapy are one component of a multi-disciplinary discharge planning process, led by the attending physician.  Recommendations may be updated based on patient status, additional functional criteria and insurance authorization.  Follow Up Recommendations  Skilled nursing-short term rehab (<3 hours/day) Can patient physically be transported by private vehicle: No   Assistance Recommended at Discharge Frequent or constant Supervision/Assistance  Patient can return home with the following A lot of help with walking and/or transfers;A lot of help with bathing/dressing/bathroom;Assistance with cooking/housework;Direct supervision/assist for medications management;Direct supervision/assist for financial management;Assist for transportation;Help with stairs or ramp for entrance   Equipment Recommendations  Wheelchair (measurements PT);Wheelchair cushion (measurements PT);Rolling walker (2 wheels);BSC/3in1    Recommendations for Other Services        Precautions / Restrictions Precautions Precautions: Fall Restrictions Weight Bearing Restrictions: Yes RLE Weight Bearing: Non weight bearing     Mobility  Bed Mobility Overal bed mobility: Needs Assistance Bed Mobility: Supine to Sit     Supine to sit: Supervision     General bed mobility comments: No physical assist required    Transfers Overall transfer level: Needs assistance Equipment used: Rolling walker (2 wheels) Transfers: Sit to/from Stand, Bed to chair/wheelchair/BSC Sit to Stand: Mod assist Stand pivot transfers: Mod assist         General transfer comment: Pt requiring modA to stand from edge of bed, mod multimodal cues for placing R foot anteriorly and for hand placement to rise. "shimmying," on L foot over to chair, pt reaching for L armrest    Ambulation/Gait                   Stairs             Wheelchair Mobility    Modified Rankin (Stroke Patients Only)       Balance Overall balance assessment: Needs assistance Sitting-balance support: No upper extremity supported, Feet supported Sitting balance-Leahy Scale: Good     Standing balance support: Bilateral upper extremity supported, Reliant on assistive device for balance Standing balance-Leahy Scale: Poor Standing balance comment: reliant on BUE support                            Cognition Arousal/Alertness: Awake/alert Behavior During Therapy: WFL for tasks assessed/performed Overall Cognitive Status: Impaired/Different from baseline Area of Impairment: Attention, Memory, Following commands, Safety/judgement, Awareness, Problem solving                   Current Attention Level: Sustained Memory: Decreased short-term memory, Decreased recall of precautions Following Commands: Follows one step commands inconsistently, Follows  one step commands with increased time Safety/Judgement: Decreased awareness of deficits, Decreased awareness of  safety Awareness: Emergent Problem Solving: Slow processing, Difficulty sequencing, Requires verbal cues, Requires tactile cues          Exercises General Exercises - Lower Extremity Long Arc Quad: AROM, AAROM, Both, 5 reps, Seated Heel Slides: AAROM, AROM, Both, 5 reps, Supine Straight Leg Raises: AROM, AAROM, Both, 5 reps, Supine    General Comments        Pertinent Vitals/Pain Pain Assessment Pain Assessment: Faces Faces Pain Scale: Hurts little more Pain Location: R foot Pain Descriptors / Indicators: Grimacing, Guarding, Aching Pain Intervention(s): Monitored during session    Home Living                          Prior Function            PT Goals (current goals can now be found in the care plan section) Acute Rehab PT Goals Patient Stated Goal: Go home Potential to Achieve Goals: Good Progress towards PT goals: Progressing toward goals    Frequency    Min 3X/week      PT Plan Current plan remains appropriate    Co-evaluation              AM-PAC PT "6 Clicks" Mobility   Outcome Measure  Help needed turning from your back to your side while in a flat bed without using bedrails?: A Little Help needed moving from lying on your back to sitting on the side of a flat bed without using bedrails?: A Little Help needed moving to and from a bed to a chair (including a wheelchair)?: A Lot Help needed standing up from a chair using your arms (e.g., wheelchair or bedside chair)?: A Lot Help needed to walk in hospital room?: Total Help needed climbing 3-5 steps with a railing? : Total 6 Click Score: 12    End of Session Equipment Utilized During Treatment: Gait belt Activity Tolerance: Patient tolerated treatment well Patient left: with call bell/phone within reach;in chair;with chair alarm set Nurse Communication: Mobility status PT Visit Diagnosis: Unsteadiness on feet (R26.81);Other abnormalities of gait and mobility (R26.89);Muscle weakness  (generalized) (M62.81);History of falling (Z91.81);Difficulty in walking, not elsewhere classified (R26.2);Pain Pain - Right/Left: Right Pain - part of body: Ankle and joints of foot     Time: 1225-1240 PT Time Calculation (min) (ACUTE ONLY): 15 min  Charges:  $Therapeutic Activity: 8-22 mins                     Wyona Almas, PT, DPT Acute Rehabilitation Services Office [phone]    Deno Etienne 06/28/2022, 12:53 PM

## 2022-06-28 NOTE — Progress Notes (Signed)
Pt dressing reinforced with the help of ortho tech.

## 2022-06-29 DIAGNOSIS — T792XXS Traumatic secondary and recurrent hemorrhage and seroma, sequela: Secondary | ICD-10-CM | POA: Diagnosis not present

## 2022-06-29 DIAGNOSIS — M79606 Pain in leg, unspecified: Secondary | ICD-10-CM | POA: Diagnosis not present

## 2022-06-29 DIAGNOSIS — I10 Essential (primary) hypertension: Secondary | ICD-10-CM | POA: Diagnosis not present

## 2022-06-29 DIAGNOSIS — E785 Hyperlipidemia, unspecified: Secondary | ICD-10-CM | POA: Diagnosis not present

## 2022-06-29 DIAGNOSIS — Z719 Counseling, unspecified: Secondary | ICD-10-CM | POA: Diagnosis not present

## 2022-06-29 DIAGNOSIS — R609 Edema, unspecified: Secondary | ICD-10-CM | POA: Diagnosis not present

## 2022-06-29 DIAGNOSIS — S93324D Dislocation of tarsometatarsal joint of right foot, subsequent encounter: Secondary | ICD-10-CM | POA: Diagnosis not present

## 2022-06-29 DIAGNOSIS — R1010 Upper abdominal pain, unspecified: Secondary | ICD-10-CM | POA: Diagnosis not present

## 2022-06-29 DIAGNOSIS — N1832 Chronic kidney disease, stage 3b: Secondary | ICD-10-CM | POA: Diagnosis not present

## 2022-06-29 DIAGNOSIS — Z9181 History of falling: Secondary | ICD-10-CM | POA: Diagnosis not present

## 2022-06-29 DIAGNOSIS — R2681 Unsteadiness on feet: Secondary | ICD-10-CM | POA: Diagnosis not present

## 2022-06-29 DIAGNOSIS — R262 Difficulty in walking, not elsewhere classified: Secondary | ICD-10-CM | POA: Diagnosis not present

## 2022-06-29 DIAGNOSIS — S92314A Nondisplaced fracture of first metatarsal bone, right foot, initial encounter for closed fracture: Secondary | ICD-10-CM | POA: Diagnosis not present

## 2022-06-29 DIAGNOSIS — G4733 Obstructive sleep apnea (adult) (pediatric): Secondary | ICD-10-CM | POA: Diagnosis not present

## 2022-06-29 DIAGNOSIS — M6281 Muscle weakness (generalized): Secondary | ICD-10-CM | POA: Diagnosis not present

## 2022-06-29 DIAGNOSIS — I2510 Atherosclerotic heart disease of native coronary artery without angina pectoris: Secondary | ICD-10-CM | POA: Diagnosis not present

## 2022-06-29 DIAGNOSIS — Z86711 Personal history of pulmonary embolism: Secondary | ICD-10-CM | POA: Diagnosis not present

## 2022-06-29 DIAGNOSIS — R41841 Cognitive communication deficit: Secondary | ICD-10-CM | POA: Diagnosis not present

## 2022-06-29 DIAGNOSIS — K5900 Constipation, unspecified: Secondary | ICD-10-CM | POA: Diagnosis not present

## 2022-06-29 DIAGNOSIS — E876 Hypokalemia: Secondary | ICD-10-CM | POA: Diagnosis not present

## 2022-06-29 DIAGNOSIS — J45909 Unspecified asthma, uncomplicated: Secondary | ICD-10-CM | POA: Diagnosis not present

## 2022-06-29 DIAGNOSIS — Z7401 Bed confinement status: Secondary | ICD-10-CM | POA: Diagnosis not present

## 2022-06-29 DIAGNOSIS — S93324A Dislocation of tarsometatarsal joint of right foot, initial encounter: Secondary | ICD-10-CM | POA: Diagnosis not present

## 2022-06-29 DIAGNOSIS — S92314D Nondisplaced fracture of first metatarsal bone, right foot, subsequent encounter for fracture with routine healing: Secondary | ICD-10-CM | POA: Diagnosis not present

## 2022-06-29 DIAGNOSIS — I5032 Chronic diastolic (congestive) heart failure: Secondary | ICD-10-CM | POA: Diagnosis not present

## 2022-06-29 MED ORDER — OXYCODONE-ACETAMINOPHEN 7.5-325 MG PO TABS: 1.0000 | ORAL_TABLET | Freq: Four times a day (QID) | ORAL | 0 refills | Status: AC | PRN

## 2022-06-29 NOTE — Progress Notes (Addendum)
Orthopaedic Trauma Service Progress Note  Patient ID: ARNOL MCGIBBON MRN: [id number] DOB/AGE: 02/21/1956 66 y.o.  Subjective:  Doing ok  Going to SNF today   Dressings changed over weekend due to further drainage from blisters Dressings look good   ROS As above Objective:   VITALS:   Vitals:   06/28/22 1420 06/28/22 1951 06/29/22 0409 06/29/22 0746  BP: (!) 153/104 (!) 145/98 (!) 153/98 (!) 147/97  Pulse:  60 66 67  Resp:  '18 18 17  '$ Temp:  98.5 F (36.9 C) 98.2 F (36.8 C) 99 F (37.2 C)  TempSrc:    Oral  SpO2:  96% 96% 95%  Weight:        Estimated body mass index is 30.62 kg/m as calculated from the following:   Height as of 05/20/22: 6' (1.829 m).   Weight as of this encounter: 102.4 kg.   Intake/Output      10/23 0701 10/24 0700 10/24 0701 10/25 0700   P.O. 600 420   Total Intake(mL/kg) 600 (5.9) 420 (4.1)   Urine (mL/kg/hr) 2000 (0.8) 300 (0.9)   Stool 0    Total Output 2000 300   Net -1400 +120          LABS  No results found for this or any previous visit (from the past 24 hour(s)).   PHYSICAL EXAM:   Gen: sitting up in bed, pleasant  Ext:       Right Lower Extremity  Splint looks good Well paded                          Good perfusion distally              Ext warm    Assessment/Plan:     Principal Problem:   Closed nondisplaced fracture of first right metatarsal bone Active Problems:   Acute renal failure superimposed on stage 3b chronic kidney disease (HCC)   Chronic diastolic CHF (congestive heart failure) (HCC)   Fall at home, initial encounter   Hypokalemia   HLD (hyperlipidemia)   Anti-infectives (From admission, onward)    None     .  66 y/o male with complex medical history with acute R lisfranc fracture dislocation with severe soft tissue swelling and fracture blisters    -fall    - R lisfranc fracture dislocation              NWB R  leg              Soft tissue swelling and blistering precludes operative intervention at this time             Would anticipate soft tissue being ready in about 2-3 weeks    Follow up with ortho in 1 week, 11/1               Aggressive ice and elevation                          Elevate leg above heart                Continue therapies  Follow up with ortho next week for skin check        - Pain management:             Multimodal             No NSAIDs due to renal insufficiency    - DVT/PE prophylaxis:             Dc on eliquis    - Dispo:             Follow up with ortho in about 1 week   Jari Pigg, PA-C [phone] (C) 06/29/2022, 10:20 AM  [facility] [place] [phone] Jenetta [phone] (F)    After 5pm and on the weekends please log on to Amion, go to orthopaedics and the look under the [facility] Call for the provider(s) on call. You can also call our office at [phone] and then follow the prompts to be connected to the call team.  Patient ID: [name], male   DOB: [birth year], 66 y.o.   MRN: [id number]

## 2022-06-29 NOTE — Progress Notes (Signed)
Report given to [facility].

## 2022-06-29 NOTE — Progress Notes (Signed)
Mobility Specialist Progress Note   06/29/22 1155  Mobility  Activity Transferred to/from [facility]  Level of Assistance Moderate assist, patient does 50-74%  Assistive Device Front wheel walker  Distance Ambulated (ft) 2 ft  RLE Weight Bearing NWB  Activity Response Tolerated well  $Mobility charge 1 Mobility   Received pt on BSC having an unsuccessful BM. Mod A for initial stand but MinA for pivot turn to chair. Placed foot under RLE to actively remind pt of WB precautions and pt still placing weight (moderate) through RLE. Transferred to chair safely and no complaints, call bell in reach and chair alarm on.    [place] Specialist Acute Rehab Office:  [phone]

## 2022-06-29 NOTE — Progress Notes (Signed)
Physical Therapy Treatment Patient Details Name: [name] MRN: [id number] DOB: 06-11-1956 Today's Date: 06/29/2022   History of Present Illness 66 y/o presented to ED on 06/23/22 after fall. Sustained Lisfranc fracture on R foot. Unable to proceed with surgery at this time due to swelling. PMH: diastolic CHF, sarcoidosis, hypertension, hyperlipidemia, history of CVA, CKD stage IIIb.    PT Comments    Pt received in bed, needing to use urinal. Attempted in supine, sitting, and standing but unsuccessful. Pt assisted in LE exercises in sitting EOB and in chair. Pt pivoted to bed with mod A. Worked on pregait activities in standing to progress gait but each time he began to initiate ambulation he puts wt on RLE. Pt able to verbalize that he is NWB but functionally has difficulty keeping, even with assist from therapist. COntinue to recommend SNF at d/c. PT will continue to follow.    Recommendations for follow up therapy are one component of a multi-disciplinary discharge planning process, led by the attending physician.  Recommendations may be updated based on patient status, additional functional criteria and insurance authorization.  Follow Up Recommendations  Skilled nursing-short term rehab (<3 hours/day) Can patient physically be transported by private vehicle: No   Assistance Recommended at Discharge Frequent or constant Supervision/Assistance  Patient can return home with the following A lot of help with walking and/or transfers;A lot of help with bathing/dressing/bathroom;Assistance with cooking/housework;Direct supervision/assist for medications management;Direct supervision/assist for financial management;Assist for transportation;Help with stairs or ramp for entrance   Equipment Recommendations  Wheelchair (measurements PT);Wheelchair cushion (measurements PT);Rolling walker (2 wheels);BSC/3in1    Recommendations for Other Services OT consult     Precautions / Restrictions  Precautions Precautions: Fall Restrictions Weight Bearing Restrictions: Yes RLE Weight Bearing: Non weight bearing Other Position/Activity Restrictions: pt able to verbalize WB status but unable to keep independently, needs manual assist     Mobility  Bed Mobility Overal bed mobility: Needs Assistance Bed Mobility: Supine to Sit     Supine to sit: Min assist     General bed mobility comments: min A to RLE due to pain    Transfers Overall transfer level: Needs assistance Equipment used: Rolling walker (2 wheels) Transfers: Sit to/from Stand, Bed to chair/wheelchair/BSC Sit to Stand: Mod assist, From elevated surface   Step pivot transfers: Min assist       General transfer comment: worked in sit>stand multiple times while  keeping RLE NWB. Needed mod A each time. Tried different hand placements but continued to need mod. Has difficulty straightening L knee in standing. Pivoted with small hops to recliner with mod A    Ambulation/Gait               General Gait Details: worked on wt shifting and trying to hop but pt unable to advance ambulation while maintaining RLE NWB   Stairs             Wheelchair Mobility    Modified Rankin (Stroke Patients Only)       Balance Overall balance assessment: Needs assistance Sitting-balance support: No upper extremity supported, Feet supported Sitting balance-Leahy Scale: Good     Standing balance support: Bilateral upper extremity supported, Reliant on assistive device for balance Standing balance-Leahy Scale: Poor Standing balance comment: reliant on BUE support                            Cognition Arousal/Alertness: Awake/alert Behavior During Therapy: [facility] for  tasks assessed/performed Overall Cognitive Status: Impaired/Different from baseline Area of Impairment: Attention, Memory, Following commands, Safety/judgement, Awareness, Problem solving                 Orientation Level:  Disoriented to, Time, Place Current Attention Level: Sustained Memory: Decreased short-term memory, Decreased recall of precautions Following Commands: Follows one step commands inconsistently, Follows one step commands with increased time Safety/Judgement: Decreased awareness of deficits, Decreased awareness of safety Awareness: Emergent Problem Solving: Slow processing, Difficulty sequencing, Requires verbal cues, Requires tactile cues General Comments: STM deficits noted day to day. Slow processing        Exercises General Exercises - Lower Extremity Quad Sets: Strengthening, Both, 10 reps, Seated Long Arc Quad: AROM, AAROM, Both, 5 reps, Seated Straight Leg Raises: AROM, Both, 5 reps, Supine    General Comments General comments (skin integrity, edema, etc.): reviewed plan of SNF with pt      Pertinent Vitals/Pain Pain Assessment Pain Assessment: Faces Faces Pain Scale: Hurts little more Pain Location: R foot Pain Descriptors / Indicators: Grimacing, Guarding, Aching Pain Intervention(s): Limited activity within patient's tolerance, Monitored during session    Home Living                          Prior Function            PT Goals (current goals can now be found in the care plan section) Acute Rehab PT Goals Patient Stated Goal: Go home PT Goal Formulation: With patient Time For Goal Achievement: 07/01/22 Potential to Achieve Goals: Good Progress towards PT goals: Progressing toward goals    Frequency    Min 3X/week      PT Plan Current plan remains appropriate    Co-evaluation              AM-PAC PT "6 Clicks" Mobility   Outcome Measure  Help needed turning from your back to your side while in a flat bed without using bedrails?: A Little Help needed moving from lying on your back to sitting on the side of a flat bed without using bedrails?: A Little Help needed moving to and from a bed to a chair (including a wheelchair)?: A Lot Help  needed standing up from a chair using your arms (e.g., wheelchair or bedside chair)?: A Lot Help needed to walk in hospital room?: Total Help needed climbing 3-5 steps with a railing? : Total 6 Click Score: 12    End of Session Equipment Utilized During Treatment: Gait belt Activity Tolerance: Patient tolerated treatment well Patient left: with call bell/phone within reach;in chair;with chair alarm set Nurse Communication: Mobility status PT Visit Diagnosis: Unsteadiness on feet (R26.81);Other abnormalities of gait and mobility (R26.89);Muscle weakness (generalized) (M62.81);History of falling (Z91.81);Difficulty in walking, not elsewhere classified (R26.2);Pain Pain - Right/Left: Right Pain - part of body: Ankle and joints of foot     Time: 5409-8119 PT Time Calculation (min) (ACUTE ONLY): 22 min  Charges:  $Therapeutic Activity: 8-22 mins                     Leighton Roach, PT  Acute Rehab Services Secure chat preferred Office [place] 06/29/2022, 11:28 AM

## 2022-06-29 NOTE — Discharge Summary (Signed)
Physician Discharge Summary  ODA LANSDOWNE BTD:[id number] DOB: 01/15/1956 DOA: 06/23/2022  PCP: [name], MD  Admit date: 06/23/2022 Discharge date: 06/29/2022  Admitted From: Home Disposition:  Home  Recommendations for Outpatient Follow-up:  Follow up with PCP in 1-2 weeks Please obtain BMP/CBC in one week   Home Health:No Equipment/Devices:None  Discharge Condition:Stable CODE STATUS:Full Diet recommendation: Heart Healthy   Brief/Interim Summary: 66 year old with past medical history of chronic kidney disease stage IIIb with a baseline creatinine of 3.6-4.6, chronic diastolic heart failure, hyperlipidemia comes in to [facility] complaining of right foot pain after a mechanical fall, CT of the right foot showed multiple metatarsal and midfoot fracture including the fourth second and third, fracture involving the Lisfranc ligament insertion, orthopedic surgery was consulted.   Discharge Diagnoses:  Principal Problem:   Closed nondisplaced fracture of first right metatarsal bone Active Problems:   Chronic diastolic CHF (congestive heart failure) (HCC)   Acute renal failure superimposed on stage 3b chronic kidney disease ([place])   Fall at home, initial encounter   Hypokalemia   HLD (hyperlipidemia)  Closed nondisplaced fracture of the right metatarsal bone: Orthopedic surgery was consulted and recommended narcotics splint for pain control. He was started on IV pain medications and switch to oral which she was able to tolerate. He was given MiraLAX for constipation. Orthopedic surgery recommended follow-up with them as an outpatient of the foot is too swollen to proceed with surgical intervention. Physical therapy evaluated the patient recommended skilled nursing facility.  Chronic kidney stage IIIb: Avoid NSAIDs and remain at baseline.  Mechanical fall: Physical therapy evaluated the patient Ortho recommended weightbearing as tolerated.  Hypokalemia: He was repleted  orally now resolved.  Chronic diastolic heart failure: No changes made to his medication.  Hyperlipidemia: Continue statins.  Discharge Instructions  Discharge Instructions     Diet - low sodium heart healthy   Complete by: As directed    Diet - low sodium heart healthy   Complete by: As directed    Increase activity slowly   Complete by: As directed    Increase activity slowly   Complete by: As directed       Allergies as of 06/29/2022       Reactions   Codeine Nausea And Vomiting   Hydrochlorothiazide Other (See Comments)   Unknown per Pt.    Hydrocodone-acetaminophen Nausea And Vomiting   Hydromorphone Other (See Comments)   hallucinations         Medication List     TAKE these medications    apixaban 2.5 MG Tabs tablet Commonly known as: ELIQUIS Take 1 tablet (2.5 mg total) by mouth 2 (two) times daily.   ascorbic acid 500 MG tablet Commonly known as: VITAMIN C Take 1 tablet (500 mg total) by mouth daily.   aspirin 81 MG tablet Take 1 tablet (81 mg total) by mouth daily.   atorvastatin 20 MG tablet Commonly known as: LIPITOR Take 20 mg by mouth daily.   cyclobenzaprine 5 MG tablet Commonly known as: FLEXERIL Take 1 tablet (5 mg total) by mouth 3 (three) times daily as needed for muscle spasms.   diazepam 5 MG tablet Commonly known as: Valium Take 1-2 tabs 60 minutes before procedure as needed (if having injection in office then take at office. do not take prior to arrival)   diltiazem 360 MG 24 hr capsule Commonly known as: TIAZAC TAKE ONE CAPSULE BY MOUTH EVERY DAY   Entresto 97-103 MG Generic drug: sacubitril-valsartan Take 1  tablet by mouth 2 (two) times daily.   Farxiga 10 MG Tabs tablet Generic drug: dapagliflozin propanediol Take 1 tablet (10 mg total) by mouth daily.   fluticasone 50 MCG/ACT nasal spray Commonly known as: FLONASE Place 1 spray into both nostrils at bedtime as needed for allergies or rhinitis.   gabapentin 100  MG capsule Commonly known as: NEURONTIN Take 1 capsule (100 mg total) by mouth 2 (two) times daily.   guaifenesin 400 MG Tabs tablet Commonly known as: HUMIBID E Take 400 mg by mouth every 4 (four) hours.   hydrALAZINE 100 MG tablet Commonly known as: APRESOLINE Take 1 tablet (100 mg total) by mouth 3 (three) times daily.   isosorbide mononitrate 30 MG 24 hr tablet Commonly known as: IMDUR Take 30 mg by mouth in the morning and at bedtime.   isosorbide mononitrate 120 MG 24 hr tablet Commonly known as: IMDUR Take 1 tablet (120 mg total) by mouth daily.   lidocaine 5 % Commonly known as: Lidoderm Apply 1 patch to skin every day (May wear up to 12hours.)   metoprolol 200 MG 24 hr tablet Commonly known as: TOPROL-XL Take 1 tablet (200 mg total) by mouth daily.   nicotine 21 mg/24hr patch Commonly known as: Nicotine Step 1 Place 1 patch (21 mg total) onto the skin daily.   nicotine polacrilex 4 MG lozenge Commonly known as: COMMIT Take 1 lozenge (4 mg total) by mouth as needed for up to 90 doses for smoking cessation.   oxyCODONE-acetaminophen 7.5-325 MG tablet Commonly known as: PERCOCET Take 1-2 tablets by mouth every 6 (six) hours as needed for severe pain.   potassium chloride SA 20 MEQ tablet Commonly known as: KLOR-CON M Take 1 tablet by mouth daily.   Probiotic Digestive Supp Caps Take 1 capsule by mouth daily.   tamsulosin 0.4 MG Caps capsule Commonly known as: Flomax Take 1 capsule by mouth daily - need appointment for more refills What changed:  how much to take when to take this   torsemide 20 MG tablet Commonly known as: DEMADEX Take 1 tablet (20 mg total) by mouth daily.   traZODone 150 MG tablet Commonly known as: DESYREL Take 1 tablet (150 mg total) by mouth at bedtime as needed for sleep. What changed: when to take this   triamcinolone cream 0.1 % Commonly known as: KENALOG Apply to affected areas twice a day as needed for  itching/inflammation. What changed:  how much to take when to take this reasons to take this   Vitamin D 125 MCG (5000 UT) Caps Take 5,000 Units by mouth daily.               Durable Medical Equipment  (From admission, onward)           Start     Ordered   06/24/22 1240  For home use only DME standard manual wheelchair with seat cushion  Once       Comments: Patient suffers from right lisfranc fracture which impairs their ability to perform daily activities like bathing, dressing, and toileting in the home.  A cane, crutch, or walker will not resolve issue with performing activities of daily living. A wheelchair will allow patient to safely perform daily activities. Patient can safely propel the wheelchair in the home or has a caregiver who can provide assistance. Length of need 6 months . Accessories: elevating leg rests (ELRs), wheel locks, extensions and anti-tippers.   06/24/22 1239   06/24/22 1239  For  home use only DME Walker  Once       Question:  Patient needs a walker to treat with the following condition  Answer:  Difficulty walking   06/24/22 1239   06/24/22 1239  For home use only DME 3 n 1  Once        06/24/22 1239            Contact information for follow-up providers     [name], MD. Schedule an appointment as soon as possible for a visit.   Specialty: Internal Medicine Contact information: [place] [address] [place], [place] Follow up.   Specialty: [facility] Contact information: [address] [name] [address] [phone]         Altamese Royalton, MD. Schedule an appointment as soon as possible for a visit on 06/30/2022.   Specialty: Orthopedic Surgery Contact information: [place] [address] [phone]              Contact information for after-discharge care     Destination     [facility] Preferred SNF .    Service: Skilled Nursing Contact information: [address] [address] [phone]                    Allergies  Allergen Reactions   Codeine Nausea And Vomiting   Hydrochlorothiazide Other (See Comments)    Unknown per Pt.    Hydrocodone-Acetaminophen Nausea And Vomiting   Hydromorphone Other (See Comments)    hallucinations     Consultations: Orthopedics during   Procedures/Studies: DG Foot Complete Right  Result Date: 06/25/2022 CLINICAL DATA:  Fall EXAM: RIGHT FOOT COMPLETE - 3+ VIEW COMPARISON:  Two days ago FINDINGS: A splint has been placed. Displaced metatarsal neck fractures involving the second and third rays. The first, second, and third metatarsal show proximal fracturing with subluxation at the Lisfranc interval where there is avulsion fracture at the second metatarsal attachment of the Lisfranc ligament. Nondisplaced first proximal phalanx fracture reaching the first MTP joint. IMPRESSION: Unchanged alignment of multiple foot fractures which are underestimated relative to preceding CT. Electronically Signed   By: Jorje Guild M.D.   On: 06/25/2022 07:44   CT Foot Right Wo Contrast  Result Date: 06/23/2022 CLINICAL DATA:  Foot trauma, occult fracture suspected, xray equivocal (Age >= 6y) EXAM: CT OF THE RIGHT FOOT WITHOUT CONTRAST TECHNIQUE: Multidetector CT imaging of the right foot was performed according to the standard protocol. Multiplanar CT image reconstructions were also generated. RADIATION DOSE REDUCTION: This exam was performed according to the departmental dose-optimization program which includes automated exposure control, adjustment of the mA and/or kV according to patient size and/or use of iterative reconstruction technique. COMPARISON:  Radiograph earlier today. FINDINGS: Bones/Joint/Cartilage Multiple metatarsal and midfoot fractures. The second and third metatarsal fractures are segmental. This includes a  mildly comminuted and displaced fracture involving the base of the first metatarsal extending into the tarsal metatarsal joint. Comminuted fracture involving the base of the second metatarsal with mild displacement extending into the tarsal metatarsal joint. Fracture involves the region of the Lisfranc ligament insertion which is poorly defined. There is a displaced mildly comminuted fracture of the second metatarsal neck. Tiny fractures involving the anterolateral cuneiform and base of the third metatarsal. Displaced minimally comminuted distal third metatarsal neck fracture. Mildly comminuted intra-articular fourth proximal metatarsal fracture  with intra-articular involvement. Nondisplaced mildly comminuted first proximal phalanx fracture. Ligaments The Lisfranc ligament is poorly defined. Muscles and Tendons Ill-defined musculature involving the intrinsic musculature of the foot likely represent intramuscular hemorrhage. There is no evidence of tendinous entrapment at the ankle. Soft tissues Prominent dorsal and lesser plantar soft tissue edema. IMPRESSION: 1. Multiple metatarsal and midfoot fractures including the first through fourth proximal metatarsals, the second and third metatarsal fractures are segmental with additional fractures of the distal metatarsal necks. 2. Fractures involve the Lisfranc ligament insertion which is poorly defined and likely torn. 3. Tiny fracture from the lateral cuneiform. 4. Comminuted first proximal phalanx fracture. Electronically Signed   By: Keith Rake M.D.   On: 06/23/2022 21:09   CT Cervical Spine Wo Contrast  Result Date: 06/23/2022 CLINICAL DATA:  Status post trauma. EXAM: CT CERVICAL SPINE WITHOUT CONTRAST TECHNIQUE: Multidetector CT imaging of the cervical spine was performed without intravenous contrast. Multiplanar CT image reconstructions were also generated. RADIATION DOSE REDUCTION: This exam was performed according to the departmental dose-optimization  program which includes automated exposure control, adjustment of the mA and/or kV according to patient size and/or use of iterative reconstruction technique. COMPARISON:  None Available. FINDINGS: Alignment: There is reversal of the normal cervical spine lordosis. Skull base and vertebrae: No acute fracture. No primary bone lesion or focal pathologic process. Soft tissues and spinal canal: No prevertebral fluid or swelling. No visible canal hematoma. Disc levels: Moderate severity endplate sclerosis, marked severity anterior osteophyte formation and mild posterior bony spurring are seen at the levels of C3-C4, C4-C5, C5-C6 and C6-C7. Marked severity intervertebral disc space narrowing is seen at C3-C4, C5-C6 and C6-C7. Bilateral moderate severity multilevel facet joint hypertrophy is noted. Upper chest: Negative. Other: None. IMPRESSION: 1. No acute fracture or subluxation in the cervical spine. 2. Moderate to marked severity multilevel degenerative changes, as described above. Electronically Signed   By: Virgina Norfolk M.D.   On: 06/23/2022 20:43   CT Head Wo Contrast  Result Date: 06/23/2022 CLINICAL DATA:  Status post fall. EXAM: CT HEAD WITHOUT CONTRAST TECHNIQUE: Contiguous axial images were obtained from the base of the skull through the vertex without intravenous contrast. RADIATION DOSE REDUCTION: This exam was performed according to the departmental dose-optimization program which includes automated exposure control, adjustment of the mA and/or kV according to patient size and/or use of iterative reconstruction technique. COMPARISON:  November 18, 2020 FINDINGS: Brain: There is mild cerebral atrophy with widening of the extra-axial spaces and ventricular dilatation. There are areas of decreased attenuation within the white matter tracts of the supratentorial brain, consistent with microvascular disease changes. Vascular: No hyperdense vessel or unexpected calcification. Skull: Normal. Negative for  fracture or focal lesion. Sinuses/Orbits: Very mild bilateral ethmoid sinus mucosal thickening is noted. Other: None. IMPRESSION: 1. No acute intracranial abnormality. 2. Generalized cerebral atrophy and microvascular disease changes of the supratentorial brain. Electronically Signed   By: Virgina Norfolk M.D.   On: 06/23/2022 20:42   DG Foot Complete Right  Result Date: 06/23/2022 CLINICAL DATA:  Trauma, fall EXAM: RIGHT FOOT COMPLETE - 3+ VIEW COMPARISON:  None Available. FINDINGS: There is comminuted undisplaced fracture in the base and shaft of proximal phalanx of big toe. There is comminuted fracture in the necks of right second and third metatarsals with medial displacement of distal fracture fragments. There is a proximally 5 mm distraction of fracture fragments. There is faint lucency in the medial aspect of head of the fourth metacarpal seen in the  AP view which may be a normal variation or suggest undisplaced fracture. There is comminuted fracture in the base of first metatarsal. Comminuted fracture is seen in the base of second metatarsal. As far as seen, there is no significant medial displacement of base of the first metatarsal or lateral displacement of bases of the third, fourth and fifth metatarsals. There is widening of space between bases of the first and second metatarsals. There is offset in alignment of distal articular surfaces of medial and middle cuneiform. There is marked soft tissue swelling over the dorsum of the foot. IMPRESSION: Comminuted undisplaced fracture is seen in the proximal phalanx of right big toe. Comminuted displaced fractures are seen in the distal portions of right second and third metatarsals. Comminuted fractures are seen in bases of first and second metatarsals. Possibility of Lisfranc type injury should be considered. Follow-up CT as clinically warranted may be considered. Electronically Signed   By: Elmer Picker M.D.   On: 06/23/2022 19:52   MR Lumbar  Spine W Wo Contrast  Result Date: 06/14/2022 CLINICAL DATA:  Chronic low back pain with bilateral leg weakness. EXAM: MRI LUMBAR SPINE WITHOUT AND WITH CONTRAST TECHNIQUE: Multiplanar and multiecho pulse sequences of the lumbar spine were obtained without and with intravenous contrast. CONTRAST:  49m MULTIHANCE GADOBENATE DIMEGLUMINE 529 MG/ML IV SOLN COMPARISON:  11/13/2016 FINDINGS: Segmentation:  5 lumbar type vertebral bodies. Alignment:  2 mm degenerative retrolisthesis at L1-2. Vertebrae:  Distant discectomy and fusion from L3 through L5. Conus medullaris and cauda equina: Conus extends to the L1 level. Conus and cauda equina appear normal. Paraspinal and other soft tissues: Negative Disc levels: T12-L1 and L1-L2: Normal L2-3: Development of some adjacent segment degenerative changes. 2 mm retrolisthesis. Bulging of the disc. Facet degeneration with facet and ligamentous hypertrophy and joint effusions. Small developing synovial cyst on the left. Mild to moderate multifactorial stenosis at this level that could worsen with standing or flexion. Abnormal motion would be likely. L3 through L5: Previous discectomy and fusion. Solid union with wide patency of the canal and foramina. L5-S1: Mild annular bulging. No compressive narrowing of the canal or foramina. Mild facet osteoarthritis. Similar appearance to the prior exam. IMPRESSION: Previous discectomy and fusion from L3 through L5 has a good appearance with solid union and wide patency of the canal and foramina. Adjacent segment degenerative disease now present at the L2-3 level. 2 mm retrolisthesis. Bulging of the disc. Facet degeneration with facet and ligamentous hypertrophy, joint effusions and a small developing synovial cyst on the left. Stenosis at this level could be symptomatic and could worsen with standing or bending. Electronically Signed   By: MNelson ChimesM.D.   On: 06/14/2022 15:12   (Echo, Carotid, EGD, Colonoscopy, ERCP)     Subjective: No complaints  Discharge Exam: Vitals:   06/29/22 0409 06/29/22 0746  BP: (!) 153/98 (!) 147/97  Pulse: 66 67  Resp: 18 17  Temp: 98.2 F (36.8 C) 99 F (37.2 C)  SpO2: 96% 95%   Vitals:   06/28/22 1420 06/28/22 1951 06/29/22 0409 06/29/22 0746  BP: (!) 153/104 (!) 145/98 (!) 153/98 (!) 147/97  Pulse:  60 66 67  Resp:  '18 18 17  '$ Temp:  98.5 F (36.9 C) 98.2 F (36.8 C) 99 F (37.2 C)  TempSrc:    Oral  SpO2:  96% 96% 95%  Weight:        General: Pt is alert, awake, not in acute distress Cardiovascular: RRR, S1/S2 +, no  rubs, no gallops Respiratory: CTA bilaterally, no wheezing, no rhonchi Abdominal: Soft, NT, ND, bowel sounds + Extremities: no edema, no cyanosis    The results of significant diagnostics from this hospitalization (including imaging, microbiology, ancillary and laboratory) are listed below for reference.     Microbiology: No results found for this or any previous visit (from the past 240 hour(s)).   Labs: BNP (last 3 results) Recent Labs    01/20/22 2352 06/24/22 0250  BNP >4,500.0* 161.0*   Basic Metabolic Panel: Recent Labs  Lab 06/24/22 0022 06/24/22 0250 06/25/22 0305 06/26/22 0303  NA 139 138 139 135  K 3.4* 3.1* 4.0 3.7  CL 101 102 99 102  CO2 '30 27 26 25  '$ GLUCOSE 94 91 102* 91  BUN 31* 28* 22 14  CREATININE 2.35* 2.36* 2.13* 1.71*  CALCIUM 9.3 9.0 9.1 8.8*  MG  --  2.2  --   --    Liver Function Tests: Recent Labs  Lab 06/24/22 0250  AST 61*  ALT 26  ALKPHOS 66  BILITOT 0.8  PROT 5.8*  ALBUMIN 3.3*   No results for input(s): "LIPASE", "AMYLASE" in the last 168 hours. No results for input(s): "AMMONIA" in the last 168 hours. CBC: Recent Labs  Lab 06/24/22 0022 06/24/22 0250  WBC 8.0 8.1  NEUTROABS  --  5.6  HGB 12.6* 12.1*  HCT 36.5* 35.6*  MCV 91.0 91.8  PLT 165 183   Cardiac Enzymes: No results for input(s): "CKTOTAL", "CKMB", "CKMBINDEX", "TROPONINI" in the last 168  hours. BNP: Invalid input(s): "POCBNP" CBG: Recent Labs  Lab 06/24/22 0807  GLUCAP 173*   D-Dimer No results for input(s): "DDIMER" in the last 72 hours. Hgb A1c No results for input(s): "HGBA1C" in the last 72 hours. Lipid Profile No results for input(s): "CHOL", "HDL", "LDLCALC", "TRIG", "CHOLHDL", "LDLDIRECT" in the last 72 hours. Thyroid function studies No results for input(s): "TSH", "T4TOTAL", "T3FREE", "THYROIDAB" in the last 72 hours.  Invalid input(s): "FREET3" Anemia work up No results for input(s): "VITAMINB12", "FOLATE", "FERRITIN", "TIBC", "IRON", "RETICCTPCT" in the last 72 hours. Urinalysis    Component Value Date/Time   COLORURINE YELLOW 06/26/2022 0253   APPEARANCEUR CLEAR 06/26/2022 0253   LABSPEC 1.014 06/26/2022 0253   PHURINE 6.0 06/26/2022 0253   GLUCOSEU 50 (A) 06/26/2022 0253   HGBUR SMALL (A) 06/26/2022 0253   BILIRUBINUR NEGATIVE 06/26/2022 0253   KETONESUR NEGATIVE 06/26/2022 0253   PROTEINUR 100 (A) 06/26/2022 0253   UROBILINOGEN 0.2 11/16/2012 1004   NITRITE NEGATIVE 06/26/2022 0253   LEUKOCYTESUR NEGATIVE 06/26/2022 0253   Sepsis Labs Recent Labs  Lab 06/24/22 0022 06/24/22 0250  WBC 8.0 8.1   Microbiology No results found for this or any previous visit (from the past 240 hour(s)).   SIGNED:   Charlynne Cousins, MD  [facility] 06/29/2022, 9:10 AM Pager   If 7PM-7AM, please contact night-coverage [email or web address] Password [email or web address]

## 2022-06-29 NOTE — Progress Notes (Signed)
Mobility Specialist Progress Note   06/29/22 1109  Mobility  Activity Transferred to/from [facility]  Level of Assistance Moderate assist, patient does 50-74%  Assistive Device Front wheel walker  Distance Ambulated (ft) 4 ft  RLE Weight Bearing NWB  Activity Response Tolerated well  $Mobility charge 1 Mobility   RN requesting assistance getting pt from chair to [facility] d/t BM urgency. Required ModA throughout with use of RW to abide by WB precautions, heavy reliance on UE's. Pt able to get to [facility] with a pivot turn but fatiguing fairly quickly. Pt left on [address] w/ call bell in reach.   Holland Falling Mobility Specialist Acute Rehab Office:  [phone]

## 2022-06-29 NOTE — TOC Transition Note (Signed)
Transition of Care [facility]) - CM/SW Discharge Note   Patient Details  Name: [name] MRN: [id number] Date of Birth: May 28, 1956  [facility] CM/SW Contact:  Joanne Chars, LCSW Phone Number: 06/29/2022, 11:47 AM   Clinical Narrative:  Pt discharging to [place], room 308.  RN call report to [phone].       Final next level of care: Skilled Nursing Facility Barriers to Discharge: Barriers Resolved   Patient Goals and CMS Choice Patient states their goals for this hospitalization and ongoing recovery are:: Rehab CMS Medicare.gov Compare Post Acute Care list provided to:: Patient Represenative (must comment) Choice offered to / list presented to : Spouse  Discharge Placement              Patient chooses bed at:  [facility]) Patient to be transferred to facility by: [place] Name of family member notified: left message with wife [name]. Pt reports she is at work. Patient and family notified of of transfer: 06/29/22  Discharge Plan and Services   Discharge Planning Services: CM Consult Post Acute Care Choice: [place] Arranged: PT [facility] Agency: [place] Date [place]: 06/24/22 Time [place]: 3202    Social Determinants of Health (SDOH) Interventions     Readmission Risk Interventions    04/09/2021    9:45 AM  Readmission Risk Prevention Plan  Transportation Screening Complete  PCP or Specialist Appt within 3-5 Days Complete  HRI or [place] Complete  Social Work Consult for [place] Planning/Counseling Complete  Palliative Care Screening Not Applicable  Medication Review Press photographer) Complete

## 2022-06-29 NOTE — Progress Notes (Signed)
Pt. Discharge to [facility], discharge instructions packet given to [facility]

## 2022-06-29 NOTE — Plan of Care (Signed)

## 2022-06-30 DIAGNOSIS — I2510 Atherosclerotic heart disease of native coronary artery without angina pectoris: Secondary | ICD-10-CM | POA: Diagnosis not present

## 2022-06-30 DIAGNOSIS — I5032 Chronic diastolic (congestive) heart failure: Secondary | ICD-10-CM | POA: Diagnosis not present

## 2022-06-30 DIAGNOSIS — G4733 Obstructive sleep apnea (adult) (pediatric): Secondary | ICD-10-CM | POA: Diagnosis not present

## 2022-06-30 DIAGNOSIS — I10 Essential (primary) hypertension: Secondary | ICD-10-CM | POA: Diagnosis not present

## 2022-06-30 DIAGNOSIS — E785 Hyperlipidemia, unspecified: Secondary | ICD-10-CM | POA: Diagnosis not present

## 2022-06-30 DIAGNOSIS — Z86711 Personal history of pulmonary embolism: Secondary | ICD-10-CM | POA: Diagnosis not present

## 2022-06-30 DIAGNOSIS — N1832 Chronic kidney disease, stage 3b: Secondary | ICD-10-CM | POA: Diagnosis not present

## 2022-06-30 DIAGNOSIS — S93324D Dislocation of tarsometatarsal joint of right foot, subsequent encounter: Secondary | ICD-10-CM | POA: Diagnosis not present

## 2022-06-30 DIAGNOSIS — Z9181 History of falling: Secondary | ICD-10-CM | POA: Diagnosis not present

## 2022-06-30 DIAGNOSIS — S92314D Nondisplaced fracture of first metatarsal bone, right foot, subsequent encounter for fracture with routine healing: Secondary | ICD-10-CM | POA: Diagnosis not present

## 2022-07-01 DIAGNOSIS — S92314D Nondisplaced fracture of first metatarsal bone, right foot, subsequent encounter for fracture with routine healing: Secondary | ICD-10-CM | POA: Diagnosis not present

## 2022-07-01 DIAGNOSIS — I5032 Chronic diastolic (congestive) heart failure: Secondary | ICD-10-CM | POA: Diagnosis not present

## 2022-07-01 DIAGNOSIS — M79606 Pain in leg, unspecified: Secondary | ICD-10-CM | POA: Diagnosis not present

## 2022-07-01 DIAGNOSIS — I10 Essential (primary) hypertension: Secondary | ICD-10-CM | POA: Diagnosis not present

## 2022-07-02 ENCOUNTER — Other Ambulatory Visit (HOSPITAL_COMMUNITY): Payer: Self-pay

## 2022-07-02 DIAGNOSIS — T792XXS Traumatic secondary and recurrent hemorrhage and seroma, sequela: Secondary | ICD-10-CM | POA: Diagnosis not present

## 2022-07-02 DIAGNOSIS — I5032 Chronic diastolic (congestive) heart failure: Secondary | ICD-10-CM | POA: Diagnosis not present

## 2022-07-05 DIAGNOSIS — I10 Essential (primary) hypertension: Secondary | ICD-10-CM | POA: Diagnosis not present

## 2022-07-05 DIAGNOSIS — S92314D Nondisplaced fracture of first metatarsal bone, right foot, subsequent encounter for fracture with routine healing: Secondary | ICD-10-CM | POA: Diagnosis not present

## 2022-07-05 DIAGNOSIS — I5032 Chronic diastolic (congestive) heart failure: Secondary | ICD-10-CM | POA: Diagnosis not present

## 2022-07-05 DIAGNOSIS — T792XXS Traumatic secondary and recurrent hemorrhage and seroma, sequela: Secondary | ICD-10-CM | POA: Diagnosis not present

## 2022-07-07 DIAGNOSIS — Z86711 Personal history of pulmonary embolism: Secondary | ICD-10-CM | POA: Diagnosis not present

## 2022-07-07 DIAGNOSIS — Z9181 History of falling: Secondary | ICD-10-CM | POA: Diagnosis not present

## 2022-07-07 DIAGNOSIS — E785 Hyperlipidemia, unspecified: Secondary | ICD-10-CM | POA: Diagnosis not present

## 2022-07-07 DIAGNOSIS — I5032 Chronic diastolic (congestive) heart failure: Secondary | ICD-10-CM | POA: Diagnosis not present

## 2022-07-07 DIAGNOSIS — I2510 Atherosclerotic heart disease of native coronary artery without angina pectoris: Secondary | ICD-10-CM | POA: Diagnosis not present

## 2022-07-07 DIAGNOSIS — I10 Essential (primary) hypertension: Secondary | ICD-10-CM | POA: Diagnosis not present

## 2022-07-07 DIAGNOSIS — S93324D Dislocation of tarsometatarsal joint of right foot, subsequent encounter: Secondary | ICD-10-CM | POA: Diagnosis not present

## 2022-07-07 DIAGNOSIS — N1832 Chronic kidney disease, stage 3b: Secondary | ICD-10-CM | POA: Diagnosis not present

## 2022-07-07 DIAGNOSIS — G4733 Obstructive sleep apnea (adult) (pediatric): Secondary | ICD-10-CM | POA: Diagnosis not present

## 2022-07-07 DIAGNOSIS — S92314D Nondisplaced fracture of first metatarsal bone, right foot, subsequent encounter for fracture with routine healing: Secondary | ICD-10-CM | POA: Diagnosis not present

## 2022-07-10 ENCOUNTER — Other Ambulatory Visit (HOSPITAL_COMMUNITY): Payer: Self-pay

## 2022-07-12 DIAGNOSIS — M6281 Muscle weakness (generalized): Secondary | ICD-10-CM | POA: Diagnosis not present

## 2022-07-12 DIAGNOSIS — I10 Essential (primary) hypertension: Secondary | ICD-10-CM | POA: Diagnosis not present

## 2022-07-12 DIAGNOSIS — I5032 Chronic diastolic (congestive) heart failure: Secondary | ICD-10-CM | POA: Diagnosis not present

## 2022-07-12 DIAGNOSIS — S92314D Nondisplaced fracture of first metatarsal bone, right foot, subsequent encounter for fracture with routine healing: Secondary | ICD-10-CM | POA: Diagnosis not present

## 2022-07-14 DIAGNOSIS — G4733 Obstructive sleep apnea (adult) (pediatric): Secondary | ICD-10-CM | POA: Diagnosis not present

## 2022-07-14 DIAGNOSIS — S93324D Dislocation of tarsometatarsal joint of right foot, subsequent encounter: Secondary | ICD-10-CM | POA: Diagnosis not present

## 2022-07-14 DIAGNOSIS — N1832 Chronic kidney disease, stage 3b: Secondary | ICD-10-CM | POA: Diagnosis not present

## 2022-07-14 DIAGNOSIS — Z86711 Personal history of pulmonary embolism: Secondary | ICD-10-CM | POA: Diagnosis not present

## 2022-07-14 DIAGNOSIS — E785 Hyperlipidemia, unspecified: Secondary | ICD-10-CM | POA: Diagnosis not present

## 2022-07-14 DIAGNOSIS — S92314D Nondisplaced fracture of first metatarsal bone, right foot, subsequent encounter for fracture with routine healing: Secondary | ICD-10-CM | POA: Diagnosis not present

## 2022-07-14 DIAGNOSIS — I5032 Chronic diastolic (congestive) heart failure: Secondary | ICD-10-CM | POA: Diagnosis not present

## 2022-07-14 DIAGNOSIS — I2510 Atherosclerotic heart disease of native coronary artery without angina pectoris: Secondary | ICD-10-CM | POA: Diagnosis not present

## 2022-07-14 DIAGNOSIS — I10 Essential (primary) hypertension: Secondary | ICD-10-CM | POA: Diagnosis not present

## 2022-07-14 DIAGNOSIS — Z9181 History of falling: Secondary | ICD-10-CM | POA: Diagnosis not present

## 2022-07-15 DIAGNOSIS — I10 Essential (primary) hypertension: Secondary | ICD-10-CM | POA: Diagnosis not present

## 2022-07-15 DIAGNOSIS — S92314D Nondisplaced fracture of first metatarsal bone, right foot, subsequent encounter for fracture with routine healing: Secondary | ICD-10-CM | POA: Diagnosis not present

## 2022-07-15 DIAGNOSIS — M6281 Muscle weakness (generalized): Secondary | ICD-10-CM | POA: Diagnosis not present

## 2022-07-19 ENCOUNTER — Ambulatory Visit (HOSPITAL_COMMUNITY): Admission: RE | Admit: 2022-07-19 | Payer: PPO | Source: Ambulatory Visit

## 2022-07-19 DIAGNOSIS — R1010 Upper abdominal pain, unspecified: Secondary | ICD-10-CM | POA: Diagnosis not present

## 2022-07-19 DIAGNOSIS — M6281 Muscle weakness (generalized): Secondary | ICD-10-CM | POA: Diagnosis not present

## 2022-07-19 DIAGNOSIS — K5900 Constipation, unspecified: Secondary | ICD-10-CM | POA: Diagnosis not present

## 2022-07-19 DIAGNOSIS — R609 Edema, unspecified: Secondary | ICD-10-CM | POA: Diagnosis not present

## 2022-07-21 DIAGNOSIS — Z86711 Personal history of pulmonary embolism: Secondary | ICD-10-CM | POA: Diagnosis not present

## 2022-07-21 DIAGNOSIS — S92314D Nondisplaced fracture of first metatarsal bone, right foot, subsequent encounter for fracture with routine healing: Secondary | ICD-10-CM | POA: Diagnosis not present

## 2022-07-21 DIAGNOSIS — Z9181 History of falling: Secondary | ICD-10-CM | POA: Diagnosis not present

## 2022-07-21 DIAGNOSIS — S93324D Dislocation of tarsometatarsal joint of right foot, subsequent encounter: Secondary | ICD-10-CM | POA: Diagnosis not present

## 2022-07-21 DIAGNOSIS — I2510 Atherosclerotic heart disease of native coronary artery without angina pectoris: Secondary | ICD-10-CM | POA: Diagnosis not present

## 2022-07-21 DIAGNOSIS — G4733 Obstructive sleep apnea (adult) (pediatric): Secondary | ICD-10-CM | POA: Diagnosis not present

## 2022-07-21 DIAGNOSIS — I5032 Chronic diastolic (congestive) heart failure: Secondary | ICD-10-CM | POA: Diagnosis not present

## 2022-07-21 DIAGNOSIS — N1832 Chronic kidney disease, stage 3b: Secondary | ICD-10-CM | POA: Diagnosis not present

## 2022-07-21 DIAGNOSIS — E785 Hyperlipidemia, unspecified: Secondary | ICD-10-CM | POA: Diagnosis not present

## 2022-07-21 DIAGNOSIS — I10 Essential (primary) hypertension: Secondary | ICD-10-CM | POA: Diagnosis not present

## 2022-07-22 ENCOUNTER — Ambulatory Visit: Payer: PPO | Admitting: Family Medicine

## 2022-07-23 DIAGNOSIS — M6281 Muscle weakness (generalized): Secondary | ICD-10-CM | POA: Diagnosis not present

## 2022-07-23 DIAGNOSIS — S92314D Nondisplaced fracture of first metatarsal bone, right foot, subsequent encounter for fracture with routine healing: Secondary | ICD-10-CM | POA: Diagnosis not present

## 2022-07-23 DIAGNOSIS — I10 Essential (primary) hypertension: Secondary | ICD-10-CM | POA: Diagnosis not present

## 2022-07-26 ENCOUNTER — Other Ambulatory Visit (HOSPITAL_COMMUNITY): Payer: Self-pay

## 2022-07-26 DIAGNOSIS — Z7984 Long term (current) use of oral hypoglycemic drugs: Secondary | ICD-10-CM | POA: Diagnosis not present

## 2022-07-26 DIAGNOSIS — S92504D Nondisplaced unspecified fracture of right lesser toe(s), subsequent encounter for fracture with routine healing: Secondary | ICD-10-CM | POA: Diagnosis not present

## 2022-07-26 DIAGNOSIS — N1832 Chronic kidney disease, stage 3b: Secondary | ICD-10-CM | POA: Diagnosis not present

## 2022-07-26 DIAGNOSIS — S99821D Other specified injuries of right foot, subsequent encounter: Secondary | ICD-10-CM | POA: Diagnosis not present

## 2022-07-26 DIAGNOSIS — S92314D Nondisplaced fracture of first metatarsal bone, right foot, subsequent encounter for fracture with routine healing: Secondary | ICD-10-CM | POA: Diagnosis not present

## 2022-07-26 DIAGNOSIS — S93324S Dislocation of tarsometatarsal joint of right foot, sequela: Secondary | ICD-10-CM | POA: Diagnosis not present

## 2022-07-26 DIAGNOSIS — R262 Difficulty in walking, not elsewhere classified: Secondary | ICD-10-CM | POA: Diagnosis not present

## 2022-07-26 DIAGNOSIS — R41841 Cognitive communication deficit: Secondary | ICD-10-CM | POA: Diagnosis not present

## 2022-07-26 DIAGNOSIS — M6281 Muscle weakness (generalized): Secondary | ICD-10-CM | POA: Diagnosis not present

## 2022-07-26 DIAGNOSIS — S93324D Dislocation of tarsometatarsal joint of right foot, subsequent encounter: Secondary | ICD-10-CM | POA: Diagnosis not present

## 2022-07-26 DIAGNOSIS — I130 Hypertensive heart and chronic kidney disease with heart failure and stage 1 through stage 4 chronic kidney disease, or unspecified chronic kidney disease: Secondary | ICD-10-CM | POA: Diagnosis not present

## 2022-07-26 DIAGNOSIS — E1122 Type 2 diabetes mellitus with diabetic chronic kidney disease: Secondary | ICD-10-CM | POA: Diagnosis not present

## 2022-07-26 DIAGNOSIS — R2681 Unsteadiness on feet: Secondary | ICD-10-CM | POA: Diagnosis not present

## 2022-07-26 DIAGNOSIS — J45909 Unspecified asthma, uncomplicated: Secondary | ICD-10-CM | POA: Diagnosis not present

## 2022-07-26 DIAGNOSIS — I5032 Chronic diastolic (congestive) heart failure: Secondary | ICD-10-CM | POA: Diagnosis not present

## 2022-07-27 DIAGNOSIS — R911 Solitary pulmonary nodule: Secondary | ICD-10-CM | POA: Diagnosis not present

## 2022-07-27 DIAGNOSIS — M5416 Radiculopathy, lumbar region: Secondary | ICD-10-CM | POA: Diagnosis not present

## 2022-07-27 DIAGNOSIS — L84 Corns and callosities: Secondary | ICD-10-CM | POA: Diagnosis not present

## 2022-07-27 DIAGNOSIS — I129 Hypertensive chronic kidney disease with stage 1 through stage 4 chronic kidney disease, or unspecified chronic kidney disease: Secondary | ICD-10-CM | POA: Diagnosis not present

## 2022-07-27 DIAGNOSIS — S92901A Unspecified fracture of right foot, initial encounter for closed fracture: Secondary | ICD-10-CM | POA: Diagnosis not present

## 2022-07-27 DIAGNOSIS — M1990 Unspecified osteoarthritis, unspecified site: Secondary | ICD-10-CM | POA: Diagnosis not present

## 2022-07-27 DIAGNOSIS — M8580 Other specified disorders of bone density and structure, unspecified site: Secondary | ICD-10-CM | POA: Diagnosis not present

## 2022-07-27 DIAGNOSIS — E785 Hyperlipidemia, unspecified: Secondary | ICD-10-CM | POA: Diagnosis not present

## 2022-07-27 DIAGNOSIS — R0602 Shortness of breath: Secondary | ICD-10-CM | POA: Diagnosis not present

## 2022-07-27 DIAGNOSIS — D692 Other nonthrombocytopenic purpura: Secondary | ICD-10-CM | POA: Diagnosis not present

## 2022-07-27 DIAGNOSIS — I5032 Chronic diastolic (congestive) heart failure: Secondary | ICD-10-CM | POA: Diagnosis not present

## 2022-07-27 DIAGNOSIS — N1831 Chronic kidney disease, stage 3a: Secondary | ICD-10-CM | POA: Diagnosis not present

## 2022-07-31 ENCOUNTER — Other Ambulatory Visit (HOSPITAL_COMMUNITY): Payer: Self-pay

## 2022-08-03 ENCOUNTER — Other Ambulatory Visit (HOSPITAL_COMMUNITY): Payer: Self-pay

## 2022-08-06 DIAGNOSIS — E1122 Type 2 diabetes mellitus with diabetic chronic kidney disease: Secondary | ICD-10-CM | POA: Diagnosis not present

## 2022-08-06 DIAGNOSIS — S92314D Nondisplaced fracture of first metatarsal bone, right foot, subsequent encounter for fracture with routine healing: Secondary | ICD-10-CM | POA: Diagnosis not present

## 2022-08-06 DIAGNOSIS — Z7984 Long term (current) use of oral hypoglycemic drugs: Secondary | ICD-10-CM | POA: Diagnosis not present

## 2022-08-06 DIAGNOSIS — S93324D Dislocation of tarsometatarsal joint of right foot, subsequent encounter: Secondary | ICD-10-CM | POA: Diagnosis not present

## 2022-08-06 DIAGNOSIS — I130 Hypertensive heart and chronic kidney disease with heart failure and stage 1 through stage 4 chronic kidney disease, or unspecified chronic kidney disease: Secondary | ICD-10-CM | POA: Diagnosis not present

## 2022-08-06 DIAGNOSIS — I5032 Chronic diastolic (congestive) heart failure: Secondary | ICD-10-CM | POA: Diagnosis not present

## 2022-08-06 DIAGNOSIS — M6281 Muscle weakness (generalized): Secondary | ICD-10-CM | POA: Diagnosis not present

## 2022-08-06 DIAGNOSIS — R262 Difficulty in walking, not elsewhere classified: Secondary | ICD-10-CM | POA: Diagnosis not present

## 2022-08-06 DIAGNOSIS — R2681 Unsteadiness on feet: Secondary | ICD-10-CM | POA: Diagnosis not present

## 2022-08-06 DIAGNOSIS — N1832 Chronic kidney disease, stage 3b: Secondary | ICD-10-CM | POA: Diagnosis not present

## 2022-08-11 DIAGNOSIS — R7301 Impaired fasting glucose: Secondary | ICD-10-CM | POA: Diagnosis not present

## 2022-08-11 DIAGNOSIS — S93324D Dislocation of tarsometatarsal joint of right foot, subsequent encounter: Secondary | ICD-10-CM | POA: Diagnosis not present

## 2022-08-11 DIAGNOSIS — M19072 Primary osteoarthritis, left ankle and foot: Secondary | ICD-10-CM | POA: Diagnosis not present

## 2022-08-11 DIAGNOSIS — E785 Hyperlipidemia, unspecified: Secondary | ICD-10-CM | POA: Diagnosis not present

## 2022-08-23 ENCOUNTER — Other Ambulatory Visit: Payer: Self-pay

## 2022-08-23 ENCOUNTER — Other Ambulatory Visit (HOSPITAL_COMMUNITY): Payer: Self-pay

## 2022-08-25 DIAGNOSIS — M6281 Muscle weakness (generalized): Secondary | ICD-10-CM | POA: Diagnosis not present

## 2022-08-25 DIAGNOSIS — R262 Difficulty in walking, not elsewhere classified: Secondary | ICD-10-CM | POA: Diagnosis not present

## 2022-08-25 DIAGNOSIS — M19072 Primary osteoarthritis, left ankle and foot: Secondary | ICD-10-CM | POA: Diagnosis not present

## 2022-08-25 DIAGNOSIS — S93324S Dislocation of tarsometatarsal joint of right foot, sequela: Secondary | ICD-10-CM | POA: Diagnosis not present

## 2022-08-25 DIAGNOSIS — J45909 Unspecified asthma, uncomplicated: Secondary | ICD-10-CM | POA: Diagnosis not present

## 2022-08-25 DIAGNOSIS — R2681 Unsteadiness on feet: Secondary | ICD-10-CM | POA: Diagnosis not present

## 2022-08-25 DIAGNOSIS — I5032 Chronic diastolic (congestive) heart failure: Secondary | ICD-10-CM | POA: Diagnosis not present

## 2022-08-25 DIAGNOSIS — S92314D Nondisplaced fracture of first metatarsal bone, right foot, subsequent encounter for fracture with routine healing: Secondary | ICD-10-CM | POA: Diagnosis not present

## 2022-08-25 DIAGNOSIS — S93324D Dislocation of tarsometatarsal joint of right foot, subsequent encounter: Secondary | ICD-10-CM | POA: Diagnosis not present

## 2022-08-25 DIAGNOSIS — R41841 Cognitive communication deficit: Secondary | ICD-10-CM | POA: Diagnosis not present

## 2022-08-27 ENCOUNTER — Other Ambulatory Visit: Payer: Self-pay

## 2022-08-27 ENCOUNTER — Other Ambulatory Visit (HOSPITAL_COMMUNITY): Payer: Self-pay

## 2022-09-13 DIAGNOSIS — D2361 Other benign neoplasm of skin of right upper limb, including shoulder: Secondary | ICD-10-CM | POA: Diagnosis not present

## 2022-09-13 DIAGNOSIS — D485 Neoplasm of uncertain behavior of skin: Secondary | ICD-10-CM | POA: Diagnosis not present

## 2022-09-13 DIAGNOSIS — D225 Melanocytic nevi of trunk: Secondary | ICD-10-CM | POA: Diagnosis not present

## 2022-09-13 DIAGNOSIS — L821 Other seborrheic keratosis: Secondary | ICD-10-CM | POA: Diagnosis not present

## 2022-09-13 DIAGNOSIS — L853 Xerosis cutis: Secondary | ICD-10-CM | POA: Diagnosis not present

## 2022-09-13 DIAGNOSIS — L814 Other melanin hyperpigmentation: Secondary | ICD-10-CM | POA: Diagnosis not present

## 2022-09-13 DIAGNOSIS — Z85828 Personal history of other malignant neoplasm of skin: Secondary | ICD-10-CM | POA: Diagnosis not present

## 2022-09-13 DIAGNOSIS — L570 Actinic keratosis: Secondary | ICD-10-CM | POA: Diagnosis not present

## 2022-09-15 DIAGNOSIS — S93324D Dislocation of tarsometatarsal joint of right foot, subsequent encounter: Secondary | ICD-10-CM | POA: Diagnosis not present

## 2022-09-15 DIAGNOSIS — M19072 Primary osteoarthritis, left ankle and foot: Secondary | ICD-10-CM | POA: Diagnosis not present

## 2022-09-19 ENCOUNTER — Other Ambulatory Visit: Payer: Self-pay

## 2022-09-20 ENCOUNTER — Other Ambulatory Visit: Payer: Self-pay

## 2022-09-22 ENCOUNTER — Other Ambulatory Visit (HOSPITAL_COMMUNITY): Payer: Self-pay

## 2022-09-22 MED ORDER — ISOSORBIDE MONONITRATE ER 30 MG PO TB24
90.0000 mg | ORAL_TABLET | Freq: Every day | ORAL | 3 refills | Status: DC
  Filled 2022-09-22: qty 300, 100d supply, fill #0
  Filled 2022-12-17: qty 300, 100d supply, fill #1
  Filled 2023-04-12 – 2023-05-26 (×3): qty 300, 100d supply, fill #2
  Filled 2023-07-31: qty 300, 100d supply, fill #3

## 2022-09-25 DIAGNOSIS — R2681 Unsteadiness on feet: Secondary | ICD-10-CM | POA: Diagnosis not present

## 2022-09-25 DIAGNOSIS — R262 Difficulty in walking, not elsewhere classified: Secondary | ICD-10-CM | POA: Diagnosis not present

## 2022-09-25 DIAGNOSIS — M6281 Muscle weakness (generalized): Secondary | ICD-10-CM | POA: Diagnosis not present

## 2022-09-25 DIAGNOSIS — R41841 Cognitive communication deficit: Secondary | ICD-10-CM | POA: Diagnosis not present

## 2022-09-25 DIAGNOSIS — S92314D Nondisplaced fracture of first metatarsal bone, right foot, subsequent encounter for fracture with routine healing: Secondary | ICD-10-CM | POA: Diagnosis not present

## 2022-09-25 DIAGNOSIS — S93324S Dislocation of tarsometatarsal joint of right foot, sequela: Secondary | ICD-10-CM | POA: Diagnosis not present

## 2022-09-25 DIAGNOSIS — J45909 Unspecified asthma, uncomplicated: Secondary | ICD-10-CM | POA: Diagnosis not present

## 2022-09-25 DIAGNOSIS — I5032 Chronic diastolic (congestive) heart failure: Secondary | ICD-10-CM | POA: Diagnosis not present

## 2022-09-28 ENCOUNTER — Other Ambulatory Visit: Payer: Self-pay

## 2022-09-28 ENCOUNTER — Other Ambulatory Visit (HOSPITAL_COMMUNITY): Payer: Self-pay

## 2022-09-29 DIAGNOSIS — N1831 Chronic kidney disease, stage 3a: Secondary | ICD-10-CM | POA: Diagnosis not present

## 2022-09-29 DIAGNOSIS — I7121 Aneurysm of the ascending aorta, without rupture: Secondary | ICD-10-CM | POA: Diagnosis not present

## 2022-09-29 DIAGNOSIS — E669 Obesity, unspecified: Secondary | ICD-10-CM | POA: Diagnosis not present

## 2022-09-29 DIAGNOSIS — R7301 Impaired fasting glucose: Secondary | ICD-10-CM | POA: Diagnosis not present

## 2022-09-29 DIAGNOSIS — I5032 Chronic diastolic (congestive) heart failure: Secondary | ICD-10-CM | POA: Diagnosis not present

## 2022-09-29 DIAGNOSIS — I700 Atherosclerosis of aorta: Secondary | ICD-10-CM | POA: Diagnosis not present

## 2022-09-29 DIAGNOSIS — N2581 Secondary hyperparathyroidism of renal origin: Secondary | ICD-10-CM | POA: Diagnosis not present

## 2022-09-29 DIAGNOSIS — J449 Chronic obstructive pulmonary disease, unspecified: Secondary | ICD-10-CM | POA: Diagnosis not present

## 2022-09-29 DIAGNOSIS — D692 Other nonthrombocytopenic purpura: Secondary | ICD-10-CM | POA: Diagnosis not present

## 2022-09-29 DIAGNOSIS — I129 Hypertensive chronic kidney disease with stage 1 through stage 4 chronic kidney disease, or unspecified chronic kidney disease: Secondary | ICD-10-CM | POA: Diagnosis not present

## 2022-09-29 DIAGNOSIS — G4733 Obstructive sleep apnea (adult) (pediatric): Secondary | ICD-10-CM | POA: Diagnosis not present

## 2022-09-29 DIAGNOSIS — I2510 Atherosclerotic heart disease of native coronary artery without angina pectoris: Secondary | ICD-10-CM | POA: Diagnosis not present

## 2022-09-30 ENCOUNTER — Other Ambulatory Visit: Payer: Self-pay

## 2022-09-30 ENCOUNTER — Other Ambulatory Visit (HOSPITAL_COMMUNITY): Payer: Self-pay

## 2022-09-30 MED ORDER — ENTRESTO 97-103 MG PO TABS
1.0000 | ORAL_TABLET | Freq: Two times a day (BID) | ORAL | 3 refills | Status: DC
  Filled 2022-09-30: qty 60, 30d supply, fill #0

## 2022-09-30 MED ORDER — ENTRESTO 97-103 MG PO TABS: 1.0000 | ORAL_TABLET | Freq: Two times a day (BID) | ORAL | 3 refills | Status: DC

## 2022-10-01 ENCOUNTER — Other Ambulatory Visit (HOSPITAL_COMMUNITY): Payer: Self-pay

## 2022-10-04 ENCOUNTER — Other Ambulatory Visit (HOSPITAL_COMMUNITY): Payer: Self-pay

## 2022-10-04 ENCOUNTER — Other Ambulatory Visit: Payer: Self-pay

## 2022-10-04 MED ORDER — ENTRESTO 97-103 MG PO TABS
1.0000 | ORAL_TABLET | Freq: Two times a day (BID) | ORAL | 3 refills | Status: DC
  Filled 2022-10-04 – 2022-11-02 (×2): qty 180, 90d supply, fill #0
  Filled 2023-01-26: qty 180, 90d supply, fill #1
  Filled 2023-05-09: qty 180, 90d supply, fill #2

## 2022-10-05 ENCOUNTER — Other Ambulatory Visit (HOSPITAL_COMMUNITY): Payer: Self-pay

## 2022-10-05 ENCOUNTER — Encounter: Payer: Self-pay | Admitting: Internal Medicine

## 2022-10-11 DIAGNOSIS — H25813 Combined forms of age-related cataract, bilateral: Secondary | ICD-10-CM | POA: Diagnosis not present

## 2022-10-11 DIAGNOSIS — H401131 Primary open-angle glaucoma, bilateral, mild stage: Secondary | ICD-10-CM | POA: Diagnosis not present

## 2022-10-11 DIAGNOSIS — H5319 Other subjective visual disturbances: Secondary | ICD-10-CM | POA: Diagnosis not present

## 2022-10-11 DIAGNOSIS — H524 Presbyopia: Secondary | ICD-10-CM | POA: Diagnosis not present

## 2022-10-11 DIAGNOSIS — H04123 Dry eye syndrome of bilateral lacrimal glands: Secondary | ICD-10-CM | POA: Diagnosis not present

## 2022-10-18 ENCOUNTER — Telehealth: Payer: Self-pay

## 2022-10-18 NOTE — Telephone Encounter (Signed)
Patient is asking if if he can cut down on dosages of his medications that we prescribe. He feels dizzy when taking them. He states that he has had lost a lot of weight and thinks that it's making him feel dizzy.

## 2022-10-21 ENCOUNTER — Other Ambulatory Visit: Payer: Self-pay | Admitting: Internal Medicine

## 2022-10-21 NOTE — Telephone Encounter (Signed)
Can you please reconcile his medications? Please ask him what his blood pressure has been running?  Santiago Stenzel [place], DO, [facility]

## 2022-10-22 NOTE — Telephone Encounter (Signed)
Patient has not been taking blood pressures at home. I asked patient about each medication on list and asked if he was taking it. I couldn't remove Eliquis. He said his family doctor took him off of it. I asked him to take his blood pressures this weekend and call us Monday with the results.

## 2022-10-23 ENCOUNTER — Other Ambulatory Visit (HOSPITAL_COMMUNITY): Payer: Self-pay

## 2022-10-25 ENCOUNTER — Other Ambulatory Visit (HOSPITAL_COMMUNITY): Payer: Self-pay

## 2022-10-25 MED ORDER — TAMSULOSIN HCL 0.4 MG PO CAPS
ORAL_CAPSULE | ORAL | 6 refills | Status: DC
  Filled 2022-10-25: qty 30, 30d supply, fill #0
  Filled 2022-11-28: qty 30, 30d supply, fill #1
  Filled 2023-01-02: qty 30, 30d supply, fill #2
  Filled 2023-02-10: qty 30, 30d supply, fill #3
  Filled 2023-03-06: qty 30, 30d supply, fill #4
  Filled 2023-04-12: qty 30, 30d supply, fill #5
  Filled 2023-05-24: qty 30, 30d supply, fill #6

## 2022-10-26 DIAGNOSIS — S92314D Nondisplaced fracture of first metatarsal bone, right foot, subsequent encounter for fracture with routine healing: Secondary | ICD-10-CM | POA: Diagnosis not present

## 2022-10-26 DIAGNOSIS — M6281 Muscle weakness (generalized): Secondary | ICD-10-CM | POA: Diagnosis not present

## 2022-10-26 DIAGNOSIS — J45909 Unspecified asthma, uncomplicated: Secondary | ICD-10-CM | POA: Diagnosis not present

## 2022-10-26 DIAGNOSIS — R2681 Unsteadiness on feet: Secondary | ICD-10-CM | POA: Diagnosis not present

## 2022-10-26 DIAGNOSIS — R262 Difficulty in walking, not elsewhere classified: Secondary | ICD-10-CM | POA: Diagnosis not present

## 2022-10-26 DIAGNOSIS — R41841 Cognitive communication deficit: Secondary | ICD-10-CM | POA: Diagnosis not present

## 2022-10-26 DIAGNOSIS — I5032 Chronic diastolic (congestive) heart failure: Secondary | ICD-10-CM | POA: Diagnosis not present

## 2022-10-26 DIAGNOSIS — S93324S Dislocation of tarsometatarsal joint of right foot, sequela: Secondary | ICD-10-CM | POA: Diagnosis not present

## 2022-10-27 ENCOUNTER — Telehealth: Payer: Self-pay

## 2022-10-27 ENCOUNTER — Other Ambulatory Visit: Payer: Self-pay

## 2022-10-27 DIAGNOSIS — I5032 Chronic diastolic (congestive) heart failure: Secondary | ICD-10-CM | POA: Diagnosis not present

## 2022-10-27 DIAGNOSIS — S93324D Dislocation of tarsometatarsal joint of right foot, subsequent encounter: Secondary | ICD-10-CM | POA: Diagnosis not present

## 2022-10-27 DIAGNOSIS — M19072 Primary osteoarthritis, left ankle and foot: Secondary | ICD-10-CM | POA: Diagnosis not present

## 2022-10-27 NOTE — Telephone Encounter (Signed)
Can you reconcile his medications.  I believe Dr. Shellia Carwin recently changed some medications.  Please provide me a list.   Dr. Terri Skains

## 2022-10-27 NOTE — Telephone Encounter (Signed)
Patient called to let us know his bp is 98/60. He fells dizzy and tired. Patient is only taking his bp when feeling bad. I instructed the patient again of the importance of keeping a bp log.

## 2022-10-27 NOTE — Telephone Encounter (Signed)
Spoke with patient, he at an appointment. He agreed to call back when he gets done to give [place] his medications.

## 2022-10-27 NOTE — Telephone Encounter (Signed)
Medications reconciled.

## 2022-11-02 DIAGNOSIS — H25812 Combined forms of age-related cataract, left eye: Secondary | ICD-10-CM | POA: Diagnosis not present

## 2022-11-02 DIAGNOSIS — H25813 Combined forms of age-related cataract, bilateral: Secondary | ICD-10-CM | POA: Diagnosis not present

## 2022-11-03 ENCOUNTER — Other Ambulatory Visit (HOSPITAL_COMMUNITY): Payer: Self-pay

## 2022-11-10 NOTE — Telephone Encounter (Signed)
ON-CALL CARDIOLOGY 11/10/22  Patient's name: [name].   MRN: [id number].    DOB: 1956/04/05 Primary care provider: [name], MD.   Interaction regarding this patient's care today: Returning his phone call. His home blood pressures are running around 120/65 mmHg. At times has episodes of lightheaded and dizziness but no overt syncope. He is compliant with medical therapy. Has not been drinking excessive amounts of alcohol.   Impression:   ICD-10-CM   1. Chronic heart failure with preserved ejection fraction (HFpEF) (HCC)  I50.32        Recommendations: Recommend that he continue his current pharmacological therapy.  If he is having episodes of lightheaded/dizziness I have asked him to check his blood pressures and if his systolic blood pressures are less than or equal to 130mHg hold the dose for the day and resume when he feels better.  He has an appointment with Dr. SFloydene Flocknext week.  Telephone encounter total time: 7 minutes  SMechele ClaudeFMcleod Seacoast Pager: [phone]: [phone]

## 2022-11-17 ENCOUNTER — Encounter: Payer: Self-pay | Admitting: Internal Medicine

## 2022-11-17 ENCOUNTER — Ambulatory Visit: Payer: PPO | Admitting: Internal Medicine

## 2022-11-17 ENCOUNTER — Other Ambulatory Visit (HOSPITAL_COMMUNITY): Payer: Self-pay

## 2022-11-17 VITALS — BP 131/86 | HR 63 | Ht 72.0 in | Wt 206.0 lb

## 2022-11-17 DIAGNOSIS — I5032 Chronic diastolic (congestive) heart failure: Secondary | ICD-10-CM

## 2022-11-17 DIAGNOSIS — I10 Essential (primary) hypertension: Secondary | ICD-10-CM | POA: Diagnosis not present

## 2022-11-17 DIAGNOSIS — E782 Mixed hyperlipidemia: Secondary | ICD-10-CM

## 2022-11-17 MED ORDER — DILTIAZEM HCL ER COATED BEADS 360 MG PO CP24
360.0000 mg | ORAL_CAPSULE | Freq: Every day | ORAL | 3 refills | Status: DC
  Filled 2022-11-17 (×2): qty 90, 90d supply, fill #0
  Filled 2023-02-18 – 2023-02-21 (×2): qty 90, 90d supply, fill #1
  Filled 2023-05-26: qty 90, 90d supply, fill #2

## 2022-11-17 NOTE — Progress Notes (Signed)
ID:  [name], DOB [birth year], MRN [id number]  PCP:  [name], MD  Cardiologist: Rex Kras, DO, [facility] (established care 12/10/2020)  Date: 11/17/22 Last Office Visit: 06/10/2021  Chief Complaint  Patient presents with   Chronic heart failure with preserved ejection fraction   Follow-up     HPI  [name] is a 67 y.o. 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  He stopped smoking a couple months ago and is using nicotine patches. He is walking 3 times a week for approximately 1 mile. He is following a low sodium, heart healthy diet. He denies chest pain, dyspnea, leg swelling, or fatigue. He says he has been feeling good since the last time he was here. No complaints or concerns.   FUNCTIONAL STATUS: No structured exercise program or daily routine.   ALLERGIES: Allergies  Allergen Reactions   Codeine Nausea And Vomiting   Hydrochlorothiazide Other (See Comments)    Unknown per Pt.    Hydrocodone-Acetaminophen Nausea And Vomiting   Hydromorphone Other (See Comments)    hallucinations     MEDICATION LIST PRIOR TO VISIT: Current Meds  Medication Sig   aspirin EC 81 MG tablet Take 81 mg by mouth daily. Swallow whole.   atorvastatin (LIPITOR) 20 MG tablet Take 20 mg by mouth daily.   Cholecalciferol (VITAMIN D) 125 MCG (5000 UT) CAPS Take 5,000 Units by mouth daily.   dapagliflozin propanediol (FARXIGA) 10 MG TABS tablet Take 1 tablet (10 mg total) by mouth daily.   hydrALAZINE (APRESOLINE) 100 MG tablet Take 100 mg by mouth 3 (three) times daily.   hydrALAZINE (APRESOLINE) 50 MG  tablet Take 50 mg by mouth.   isosorbide mononitrate (IMDUR) 30 MG 24 hr tablet Take 3 tablets (90 mg total) by mouth daily.   metoprolol (TOPROL-XL) 200 MG 24 hr tablet Take 1 tablet (200 mg total) by mouth daily.   potassium chloride SA (KLOR-CON M) 20 MEQ tablet Take 1 tablet by mouth daily.   sacubitril-valsartan (ENTRESTO) 97-103 MG Take 1 tablet by mouth 2 (two) times daily.   tamsulosin (FLOMAX) 0.4 MG CAPS capsule Take 1 capsule by mouth daily   torsemide (DEMADEX) 20 MG tablet Take 20 mg by mouth daily.   traZODone (DESYREL) 150 MG tablet Take 1 tablet (150 mg total) by mouth at bedtime as needed for sleep. (Patient taking differently: Take 150 mg by mouth at bedtime.)   triamcinolone cream (KENALOG) 0.1 % Apply to affected areas twice a day as needed for itching/inflammation. (Patient taking differently: Apply 1 Application topically as needed (itching).)   [DISCONTINUED] diltiazem (CARDIZEM CD) 360 MG 24 hr capsule Take 360 mg by mouth daily.     PAST MEDICAL HISTORY: 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   ED (erectile dysfunction)    GERD (gastroesophageal reflux disease)    Hypercalcemia    Hyperlipidemia  Hypertension    Insomnia    Long-term use of high-risk medication    Nephrocalcinosis    Nephrolithiasis    Obesity    Osteopenia    Pancreatitis    admitted 03/19/01-06/05/01   PE (pulmonary embolism) 01/30/2013   Proteinuria    Sarcoidosis    Sleep apnea    Smoker    Stroke [facility])   Patient and wife denies hx of CVA.   PAST SURGICAL HISTORY: Past Surgical History:  Procedure Laterality Date   ABDOMINAL EXPLORATION SURGERY     BACK SURGERY     CHOLECYSTECTOMY     RIGHT/LEFT HEART CATH AND CORONARY ANGIOGRAPHY N/A  05/12/2021   Procedure: RIGHT/LEFT HEART CATH AND CORONARY ANGIOGRAPHY;  Surgeon: [name], MD;  Location: [place] [facility];  Service: Cardiovascular;  Laterality: N/A;   SHOULDER ARTHROSCOPY Right 10/30/2020   Procedure: ARTHROSCOPY SHOULDER WITH EXTENSIVE DEBRIDEMENT;  Surgeon: Mcarthur Rossetti, MD;  Location: [place];  Service: Orthopedics;  Laterality: Right;   SHOULDER SURGERY     TRACHEOSTOMY     closed    FAMILY HISTORY: The patient family history includes Alzheimer's disease in his mother; CVA in his father; Heart failure in his sister; Hypertension in his father and sister; Lung cancer in his father.  SOCIAL HISTORY:  The patient  reports that he quit smoking about 20 months ago. His smoking use included cigarettes. He started smoking about 17 years ago. He has a 3.75 pack-year smoking history. He has never used smokeless tobacco. He reports that he does not drink alcohol and does not use drugs.  REVIEW OF SYSTEMS: Review of Systems  Constitutional: Negative for malaise/fatigue, weight gain and weight loss.  Cardiovascular:  Negative for chest pain, dyspnea on exertion (Chronic and stable), leg swelling, orthopnea, palpitations, paroxysmal nocturnal dyspnea and syncope.  Respiratory:  Positive for cough (Dry, nonproductive). Negative for shortness of breath (Chronic and stable).     PHYSICAL EXAM: Physical Exam Neck:     Vascular: No carotid bruit.  Cardiovascular:     Rate and Rhythm: Normal rate and regular rhythm.     Pulses: Normal pulses.          Carotid pulses are 2+ on the right side and 2+ on the left side.      Radial pulses are 2+ on the right side and 2+ on the left side.       Femoral pulses are 2+ on the right side and 2+ on the left side.      Popliteal pulses are 2+ on the right side and 2+ on the left side.       Dorsalis pedis pulses are 2+ on the right side and 2+ on the left side.       Posterior tibial pulses are 2+  on the right side and 2+ on the left side.     Heart sounds: S1 normal and S2 normal. No murmur heard. Pulmonary:     Effort: Pulmonary effort is normal.     Breath sounds: Normal breath sounds. No wheezing or rales.     Comments: Diminished lung sounds at bilateral bases Abdominal:     General: Bowel sounds are normal.     Palpations: Abdomen is soft.  Musculoskeletal:     Cervical back: Neck supple.     Right lower leg: No edema.     Left lower leg: No edema.  Neurological:     Mental Status: He is alert.  11/17/2022    8:14 AM 06/29/2022    7:46 AM 06/29/2022    4:09 AM  Vitals with BMI  Height '6\' 0"'$     Weight 206 lbs    BMI 123456    Systolic [id number]  Diastolic 86 97 98  Pulse 63 67 66   CARDIAC DATABASE: EKG: 12/18/2020: Sinus bradycardia, 58 bpm, left atrial enlargement, diffuse T wave inversions.  No significant change compared to prior ECG.  Echocardiogram: 05/07/2022: 1. Normal LV systolic function with visual EF 55-60%. Left ventricle size is decreased. Severe concentric hypertrophy of the left ventricle. Normal global wall motion. Indeterminate diastolic filling pattern, normal LAP. Calculated EF 59%. 2. Structurally normal trileaflet aortic valve. Trace aortic regurgitation. 3. Structurally normal tricuspid valve with trace regurgitation. No evidence of pulmonary hypertension. 4. No significant change compared to 12/2020.  Stress Testing: No results found for this or any previous visit from the past 1095 days.  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 If he has angina symptoms in future, could consider PCI to distal RCA.  CT chest without contrast: 04/21/2021 1. Widespread patchy nodular thickening of the peribronchovascular interstitium throughout both lungs, most prominent in the upper lobes, with associated patchy peribronchovascular ground-glass opacity. Masslike 3.7 cm focus of consolidation in the anterior right lower lobe, new. 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 Follow-up outpatient high-resolution chest CT suggested in 3-6 months. 2. Mild cardiomegaly. Three-vessel coronary atherosclerosis. 3. Dilated main pulmonary artery, suggesting pulmonary arterial hypertension. 4. Ectatic 4.4 cm ascending thoracic aorta. Recommend annual imaging followup by CTA or MRA. This recommendation follows 2010 ACCF/AHA/AATS/ACR/ASA/SCA/SCAI/SIR/STS/SVM Guidelines for the Diagnosis and Management of Patients with Thoracic Aortic Disease. Circulation. 2010; [id number]. Aortic aneurysm NOS (ICD10-I71.9). 5. Aortic Atherosclerosis (ICD10-I70.0).  LABORATORY DATA:    Latest Ref Rng & Units 06/24/2022    2:50 AM 06/24/2022   12:22 AM 01/21/2022    7:38 AM  CBC  WBC 4.0 - 10.5 K/uL 8.1  8.0  7.9   Hemoglobin 13.0 - 17.0 g/dL 12.1  12.6  11.2   Hematocrit 39.0 - 52.0 % 35.6  36.5  35.0   Platelets 150 - 400 K/uL 183  165  174        Latest Ref Rng & Units 06/26/2022    3:03 AM 06/25/2022    3:05 AM 06/24/2022    2:50 AM  CMP  Glucose 70 - 99 mg/dL 91  102  91   BUN 8 - 23 mg/dL '14  22  28   '$ Creatinine 0.61 - 1.24 mg/dL 1.71  2.13  2.36   Sodium 135 - 145 mmol/L 135  139  138   Potassium 3.5 - 5.1 mmol/L 3.7  4.0  3.1   Chloride 98 - 111 mmol/L 102  99  102   CO2 22 - 32 mmol/L 25  26  27  Calcium 8.9 - 10.3 mg/dL 8.8  9.1  9.0   Total Protein 6.5 - 8.1 g/dL   5.8   Total Bilirubin 0.3 - 1.2 mg/dL   0.8   Alkaline Phos 38 -  126 U/L   66   AST 15 - 41 U/L   61   ALT 0 - 44 U/L   26     Lipid Panel     Component Value Date/Time   CHOL 111 04/09/2021 0147   TRIG 53 04/09/2021 0147   HDL 62 04/09/2021 0147   CHOLHDL 1.8 04/09/2021 0147   VLDL 11 04/09/2021 0147   LDLCALC 38 04/09/2021 0147    No components found for: "NTPROBNP" Recent Labs    02/09/22 0713 02/23/22 0709  PROBNP 2,662* 2,247*   No results for input(s): "TSH" in the last 8760 hours.   BMP Recent Labs    06/24/22 0250 06/25/22 0305 06/26/22 0303  NA 138 139 135  K 3.1* 4.0 3.7  CL 102 99 102  CO2 '27 26 25  '$ GLUCOSE 91 102* 91  BUN 28* 22 14  CREATININE 2.36* 2.13* 1.71*  CALCIUM 9.0 9.1 8.8*  GFRNONAA 30* 34* 44*    HEMOGLOBIN A1C Lab Results  Component Value Date   HGBA1C 5.7 (H) 04/08/2021   MPG 116.89 04/08/2021    External Labs: Collected: 12/08/2020 Creatinine 1.7 mg/dL. (Serum creatinine 1.6 mg/dL collected 07/11/2020) eGFR: 40.9 mL/min per 1.73 m Sodium 143, potassium 3.5, chloride 104, bicarb 32 Hemoglobin 12.6, hematocrit 38.2 NT proBNP 5576  IMPRESSION:    ICD-10-CM   1. Chronic heart failure with preserved ejection fraction (HFpEF) (HCC)  I50.32 EKG 12-Lead    2. Essential hypertension  I10     3. Mixed hyperlipidemia  E78.2      Medications Discontinued During This Encounter  Medication Reason   diltiazem (CARDIZEM CD) 360 MG 24 hr capsule Reorder    Current Outpatient Medications:    aspirin EC 81 MG tablet, Take 81 mg by mouth daily. Swallow whole., Disp: , Rfl:    atorvastatin (LIPITOR) 20 MG tablet, Take 20 mg by mouth daily., Disp: , Rfl:    Cholecalciferol (VITAMIN D) 125 MCG (5000 UT) CAPS, Take 5,000 Units by mouth daily., Disp: , Rfl:    dapagliflozin propanediol (FARXIGA) 10 MG TABS tablet, Take 1 tablet (10 mg total) by mouth daily., Disp: 90 tablet, Rfl: 3   hydrALAZINE (APRESOLINE) 100 MG tablet, Take 100 mg by mouth 3 (three) times daily., Disp: , Rfl:    hydrALAZINE  (APRESOLINE) 50 MG tablet, Take 50 mg by mouth., Disp: , Rfl:    isosorbide mononitrate (IMDUR) 30 MG 24 hr tablet, Take 3 tablets (90 mg total) by mouth daily., Disp: 270 tablet, Rfl: 3   metoprolol (TOPROL-XL) 200 MG 24 hr tablet, Take 1 tablet (200 mg total) by mouth daily., Disp: 90 tablet, Rfl: 3   potassium chloride SA (KLOR-CON M) 20 MEQ tablet, Take 1 tablet by mouth daily., Disp: 90 tablet, Rfl: 1   sacubitril-valsartan (ENTRESTO) 97-103 MG, Take 1 tablet by mouth 2 (two) times daily., Disp: 180 tablet, Rfl: 3   tamsulosin (FLOMAX) 0.4 MG CAPS capsule, Take 1 capsule by mouth daily, Disp: 30 capsule, Rfl: 6   torsemide (DEMADEX) 20 MG tablet, Take 20 mg by mouth daily., Disp: , Rfl:    traZODone (DESYREL) 150 MG tablet, Take 1 tablet (150 mg total) by mouth at bedtime as needed for sleep. (Patient taking differently: Take 150  mg by mouth at bedtime.), Disp: 90 tablet, Rfl: 3   triamcinolone cream (KENALOG) 0.1 %, Apply to affected areas twice a day as needed for itching/inflammation. (Patient taking differently: Apply 1 Application topically as needed (itching).), Disp: 80 g, Rfl: 2   ascorbic acid (VITAMIN C) 500 MG tablet, Take 1 tablet (500 mg total) by mouth daily., Disp: 30 tablet, Rfl: 0   diltiazem (CARDIZEM CD) 360 MG 24 hr capsule, Take 1 capsule (360 mg total) by mouth daily., Disp: 90 capsule, Rfl: 3   furosemide (LASIX) 20 MG tablet, Take 20 mg by mouth daily., Disp: , Rfl:   Orders Placed This Encounter  Procedures   EKG 12-Lead    RECOMMENDATIONS:  [name] is a 67 y.o. male whose past medical history and cardiac risk factors include: Sarcoidosis, HTN, Tobacco abuse, Hx of PE (was on Xarelto for 1 year), OSA not on CPAP, MSSA Bacteremia (March 2022), Hx of cardiac arrest (2002 during his hospitalization for pancreatitis per wife).  Chronic heart failure with preserved ejection fraction (HFpEF) ([place]) Last hospitalization for congestive heart failure May 2023, prior  to that was August 2022. Stage C, NYHA class II/III Appears euvolemic on physical examination. Continue current medications without changes  NSVT (nonsustained ventricular tachycardia) (HCC) Currently on calcium and beta-blockers.  Essential hypertension Office blood pressures are now better controlled Continue current medications  Mixed hyperlipidemia Currently on atorvastatin.   He denies myalgia or other side effects.  OSA (obstructive sleep apnea) Intermittently uses CPAP, educated on importance of regular use/compliance.  Sarcoidosis of lung ([place]) Follows with pulmonary medicine.    Follow-up in 6 months or sooner if needed    Floydene Flock, DO, [facility] Office: [phone]

## 2022-11-24 DIAGNOSIS — R2681 Unsteadiness on feet: Secondary | ICD-10-CM | POA: Diagnosis not present

## 2022-11-24 DIAGNOSIS — H268 Other specified cataract: Secondary | ICD-10-CM | POA: Diagnosis not present

## 2022-11-24 DIAGNOSIS — S93324S Dislocation of tarsometatarsal joint of right foot, sequela: Secondary | ICD-10-CM | POA: Diagnosis not present

## 2022-11-24 DIAGNOSIS — R41841 Cognitive communication deficit: Secondary | ICD-10-CM | POA: Diagnosis not present

## 2022-11-24 DIAGNOSIS — R262 Difficulty in walking, not elsewhere classified: Secondary | ICD-10-CM | POA: Diagnosis not present

## 2022-11-24 DIAGNOSIS — H25812 Combined forms of age-related cataract, left eye: Secondary | ICD-10-CM | POA: Diagnosis not present

## 2022-11-24 DIAGNOSIS — M6281 Muscle weakness (generalized): Secondary | ICD-10-CM | POA: Diagnosis not present

## 2022-11-24 DIAGNOSIS — J45909 Unspecified asthma, uncomplicated: Secondary | ICD-10-CM | POA: Diagnosis not present

## 2022-11-24 DIAGNOSIS — S92314D Nondisplaced fracture of first metatarsal bone, right foot, subsequent encounter for fracture with routine healing: Secondary | ICD-10-CM | POA: Diagnosis not present

## 2022-11-24 DIAGNOSIS — I5032 Chronic diastolic (congestive) heart failure: Secondary | ICD-10-CM | POA: Diagnosis not present

## 2022-11-28 ENCOUNTER — Other Ambulatory Visit: Payer: Self-pay

## 2022-11-29 ENCOUNTER — Other Ambulatory Visit (HOSPITAL_COMMUNITY): Payer: Self-pay

## 2022-11-29 ENCOUNTER — Other Ambulatory Visit: Payer: Self-pay

## 2022-11-29 MED ORDER — TORSEMIDE 20 MG PO TABS
20.0000 mg | ORAL_TABLET | Freq: Every day | ORAL | 2 refills | Status: DC
  Filled 2022-11-29: qty 30, 30d supply, fill #0
  Filled 2023-01-02: qty 30, 30d supply, fill #1
  Filled 2023-01-30: qty 30, 30d supply, fill #2

## 2022-12-16 DIAGNOSIS — H25811 Combined forms of age-related cataract, right eye: Secondary | ICD-10-CM | POA: Diagnosis not present

## 2022-12-17 ENCOUNTER — Other Ambulatory Visit: Payer: Self-pay | Admitting: Cardiology

## 2022-12-17 ENCOUNTER — Other Ambulatory Visit (HOSPITAL_COMMUNITY): Payer: Self-pay

## 2022-12-17 ENCOUNTER — Other Ambulatory Visit: Payer: Self-pay

## 2022-12-17 MED ORDER — POTASSIUM CHLORIDE CRYS ER 20 MEQ PO TBCR
20.0000 meq | EXTENDED_RELEASE_TABLET | Freq: Every day | ORAL | 1 refills | Status: DC
  Filled 2022-12-17: qty 90, 90d supply, fill #0
  Filled 2023-03-13: qty 90, 90d supply, fill #1

## 2022-12-18 ENCOUNTER — Other Ambulatory Visit (HOSPITAL_COMMUNITY): Payer: Self-pay

## 2022-12-22 DIAGNOSIS — H25811 Combined forms of age-related cataract, right eye: Secondary | ICD-10-CM | POA: Diagnosis not present

## 2022-12-22 DIAGNOSIS — H268 Other specified cataract: Secondary | ICD-10-CM | POA: Diagnosis not present

## 2022-12-25 DIAGNOSIS — S93324S Dislocation of tarsometatarsal joint of right foot, sequela: Secondary | ICD-10-CM | POA: Diagnosis not present

## 2022-12-25 DIAGNOSIS — R262 Difficulty in walking, not elsewhere classified: Secondary | ICD-10-CM | POA: Diagnosis not present

## 2022-12-25 DIAGNOSIS — R41841 Cognitive communication deficit: Secondary | ICD-10-CM | POA: Diagnosis not present

## 2022-12-25 DIAGNOSIS — J45909 Unspecified asthma, uncomplicated: Secondary | ICD-10-CM | POA: Diagnosis not present

## 2022-12-25 DIAGNOSIS — R2681 Unsteadiness on feet: Secondary | ICD-10-CM | POA: Diagnosis not present

## 2022-12-25 DIAGNOSIS — M6281 Muscle weakness (generalized): Secondary | ICD-10-CM | POA: Diagnosis not present

## 2022-12-25 DIAGNOSIS — I5032 Chronic diastolic (congestive) heart failure: Secondary | ICD-10-CM | POA: Diagnosis not present

## 2022-12-25 DIAGNOSIS — S92314D Nondisplaced fracture of first metatarsal bone, right foot, subsequent encounter for fracture with routine healing: Secondary | ICD-10-CM | POA: Diagnosis not present

## 2023-01-03 ENCOUNTER — Other Ambulatory Visit: Payer: Self-pay

## 2023-01-03 ENCOUNTER — Other Ambulatory Visit (HOSPITAL_COMMUNITY): Payer: Self-pay

## 2023-01-04 ENCOUNTER — Other Ambulatory Visit (HOSPITAL_COMMUNITY): Payer: Self-pay

## 2023-01-24 DIAGNOSIS — S92314D Nondisplaced fracture of first metatarsal bone, right foot, subsequent encounter for fracture with routine healing: Secondary | ICD-10-CM | POA: Diagnosis not present

## 2023-01-24 DIAGNOSIS — J45909 Unspecified asthma, uncomplicated: Secondary | ICD-10-CM | POA: Diagnosis not present

## 2023-01-24 DIAGNOSIS — R41841 Cognitive communication deficit: Secondary | ICD-10-CM | POA: Diagnosis not present

## 2023-01-24 DIAGNOSIS — I5032 Chronic diastolic (congestive) heart failure: Secondary | ICD-10-CM | POA: Diagnosis not present

## 2023-01-24 DIAGNOSIS — R262 Difficulty in walking, not elsewhere classified: Secondary | ICD-10-CM | POA: Diagnosis not present

## 2023-01-24 DIAGNOSIS — S93324S Dislocation of tarsometatarsal joint of right foot, sequela: Secondary | ICD-10-CM | POA: Diagnosis not present

## 2023-01-24 DIAGNOSIS — R2681 Unsteadiness on feet: Secondary | ICD-10-CM | POA: Diagnosis not present

## 2023-01-24 DIAGNOSIS — M6281 Muscle weakness (generalized): Secondary | ICD-10-CM | POA: Diagnosis not present

## 2023-01-27 ENCOUNTER — Other Ambulatory Visit (HOSPITAL_COMMUNITY): Payer: Self-pay

## 2023-01-27 ENCOUNTER — Other Ambulatory Visit: Payer: Self-pay

## 2023-01-28 ENCOUNTER — Other Ambulatory Visit (HOSPITAL_COMMUNITY): Payer: Self-pay

## 2023-02-01 ENCOUNTER — Other Ambulatory Visit (HOSPITAL_COMMUNITY): Payer: Self-pay

## 2023-02-01 ENCOUNTER — Other Ambulatory Visit: Payer: Self-pay

## 2023-02-21 ENCOUNTER — Other Ambulatory Visit (HOSPITAL_COMMUNITY): Payer: Self-pay

## 2023-02-21 ENCOUNTER — Other Ambulatory Visit: Payer: Self-pay

## 2023-02-24 DIAGNOSIS — J45909 Unspecified asthma, uncomplicated: Secondary | ICD-10-CM | POA: Diagnosis not present

## 2023-02-24 DIAGNOSIS — M6281 Muscle weakness (generalized): Secondary | ICD-10-CM | POA: Diagnosis not present

## 2023-02-24 DIAGNOSIS — R41841 Cognitive communication deficit: Secondary | ICD-10-CM | POA: Diagnosis not present

## 2023-02-24 DIAGNOSIS — R2681 Unsteadiness on feet: Secondary | ICD-10-CM | POA: Diagnosis not present

## 2023-02-24 DIAGNOSIS — S92314D Nondisplaced fracture of first metatarsal bone, right foot, subsequent encounter for fracture with routine healing: Secondary | ICD-10-CM | POA: Diagnosis not present

## 2023-02-24 DIAGNOSIS — R262 Difficulty in walking, not elsewhere classified: Secondary | ICD-10-CM | POA: Diagnosis not present

## 2023-02-24 DIAGNOSIS — S93324S Dislocation of tarsometatarsal joint of right foot, sequela: Secondary | ICD-10-CM | POA: Diagnosis not present

## 2023-02-24 DIAGNOSIS — I5032 Chronic diastolic (congestive) heart failure: Secondary | ICD-10-CM | POA: Diagnosis not present

## 2023-03-04 ENCOUNTER — Ambulatory Visit: Payer: PPO | Admitting: Plastic Surgery

## 2023-03-04 ENCOUNTER — Encounter: Payer: Self-pay | Admitting: Plastic Surgery

## 2023-03-04 VITALS — BP 129/82 | HR 81 | Wt 209.2 lb

## 2023-03-04 DIAGNOSIS — H57812 Brow ptosis, left: Secondary | ICD-10-CM | POA: Diagnosis not present

## 2023-03-04 DIAGNOSIS — H02831 Dermatochalasis of right upper eyelid: Secondary | ICD-10-CM

## 2023-03-04 DIAGNOSIS — H02412 Mechanical ptosis of left eyelid: Secondary | ICD-10-CM | POA: Diagnosis not present

## 2023-03-04 DIAGNOSIS — H02839 Dermatochalasis of unspecified eye, unspecified eyelid: Secondary | ICD-10-CM | POA: Insufficient documentation

## 2023-03-04 DIAGNOSIS — H02834 Dermatochalasis of left upper eyelid: Secondary | ICD-10-CM | POA: Diagnosis not present

## 2023-03-04 NOTE — Progress Notes (Signed)
Patient ID: [name], male    DOB: [birth year], 67 y.o.   MRN: [id number]   Chief Complaint  Patient presents with   Consult   Skin Problem    HPI  The patient is a 67 yrs old male here for evaluation of his upper lids.  He sent me his paperwork from [facility].  The report was consistent with brow ptosis and ptosis as well as dermatochalasis of all 4 lids and presbyopia on exam I agree and think that he may also need a brow lift even if it is just on the left side.  But because his left lid is involved he will need oculoplastics.  Review of Systems  Constitutional: Negative.   HENT: Negative.    Eyes: Negative.   Respiratory: Negative.  Negative for chest tightness.   Cardiovascular: Negative.   Gastrointestinal: Negative.   Endocrine: Negative.   Genitourinary: Negative.   Musculoskeletal: Negative.     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   ED (erectile dysfunction)    GERD (gastroesophageal reflux disease)    Hypercalcemia    Hyperlipidemia    Hypertension    Insomnia    Long-term use of high-risk medication    Nephrocalcinosis    Nephrolithiasis    Obesity    Osteopenia    Pancreatitis    admitted 03/19/01-06/05/01   PE (pulmonary embolism) 01/30/2013   Proteinuria    Sarcoidosis    Sleep apnea    Smoker    Stroke [facility])     Past Surgical History:  Procedure Laterality Date   ABDOMINAL EXPLORATION SURGERY     BACK SURGERY     CHOLECYSTECTOMY     RIGHT/LEFT HEART CATH AND CORONARY ANGIOGRAPHY N/A 05/12/2021   Procedure: RIGHT/LEFT HEART CATH AND CORONARY ANGIOGRAPHY;  Surgeon: Elder Negus, MD;  Location: [facility];  Service: Cardiovascular;  Laterality: N/A;   SHOULDER ARTHROSCOPY  Right 10/30/2020   Procedure: ARTHROSCOPY SHOULDER WITH EXTENSIVE DEBRIDEMENT;  Surgeon: Kathryne Hitch, MD;  Location: [facility];  Service: Orthopedics;  Laterality: Right;   SHOULDER SURGERY     TRACHEOSTOMY     closed      Current Outpatient Medications:    ascorbic acid (VITAMIN C) 500 MG tablet, Take 1 tablet (500 mg total) by mouth daily., Disp: 30 tablet, Rfl: 0   aspirin EC 81 MG tablet, Take 81 mg by mouth daily. Swallow whole., Disp: , Rfl:    atorvastatin (LIPITOR) 20 MG tablet, Take 20 mg by mouth daily., Disp: , Rfl:    Cholecalciferol (VITAMIN D) 125 MCG (5000 UT) CAPS, Take 5,000 Units by mouth daily., Disp: , Rfl:    dapagliflozin propanediol (FARXIGA) 10 MG TABS tablet, Take 1 tablet (10 mg total) by mouth daily., Disp: 90 tablet, Rfl: 3   diltiazem (CARDIZEM CD) 360 MG 24 hr capsule, Take 1 capsule (360 mg total) by mouth daily., Disp: 90 capsule, Rfl: 3   furosemide (LASIX) 20 MG tablet, Take 20 mg by mouth daily., Disp: , Rfl:    hydrALAZINE (APRESOLINE) 100 MG tablet, Take 100 mg by mouth 3 (three) times daily., Disp: , Rfl:  hydrALAZINE (APRESOLINE) 50 MG tablet, Take 50 mg by mouth., Disp: , Rfl:    isosorbide mononitrate (IMDUR) 30 MG 24 hr tablet, Take 3 tablets (90 mg total) by mouth daily., Disp: 270 tablet, Rfl: 3   metoprolol (TOPROL-XL) 200 MG 24 hr tablet, Take 1 tablet (200 mg total) by mouth daily., Disp: 90 tablet, Rfl: 3   potassium chloride SA (KLOR-CON M) 20 MEQ tablet, Take 1 tablet (20 mEq total) by mouth daily., Disp: 90 tablet, Rfl: 1   sacubitril-valsartan (ENTRESTO) 97-103 MG, Take 1 tablet by mouth 2 (two) times daily., Disp: 180 tablet, Rfl: 3   tamsulosin (FLOMAX) 0.4 MG CAPS capsule, Take 1 capsule by mouth daily, Disp: 30 capsule, Rfl: 6   torsemide (DEMADEX) 20 MG tablet, Take 1 tablet (20 mg total) by mouth daily., Disp: 30 tablet, Rfl: 2   traZODone (DESYREL) 150 MG tablet, Take 1 tablet (150 mg total) by mouth at  bedtime as needed for sleep. (Patient taking differently: Take 150 mg by mouth at bedtime.), Disp: 90 tablet, Rfl: 3   triamcinolone cream (KENALOG) 0.1 %, Apply to affected areas twice a day as needed for itching/inflammation. (Patient taking differently: Apply 1 Application topically as needed (itching).), Disp: 80 g, Rfl: 2   Objective:   Vitals:   03/04/23 0905  BP: 129/82  Pulse: 81  SpO2: 100%    Physical Exam Vitals and nursing note reviewed.  Constitutional:      Appearance: Normal appearance.  Cardiovascular:     Rate and Rhythm: Normal rate.     Pulses: Normal pulses.  Pulmonary:     Effort: Pulmonary effort is normal.  Musculoskeletal:        General: No swelling or deformity.  Skin:    General: Skin is warm.     Capillary Refill: Capillary refill takes less than 2 seconds.  Neurological:     Mental Status: He is alert and oriented to person, place, and time.  Psychiatric:        Mood and Affect: Mood normal.        Behavior: Behavior normal.        Thought Content: Thought content normal.        Judgment: Judgment normal.     Assessment & Plan:  Dermatochalasis of both upper eyelids  Brow ptosis, left  Acquired mechanical ptosis of left eyelid  The patient is in agreement to see the oculoplastic surgeon to would be able to get everything done at 1 time.  I will talk to Dr. Shawna Orleans and let him know about this patient as well.  Pictures were obtained of the patient and placed in the chart with the patient's or guardian's permission.   Manuel Bills [name], DO

## 2023-03-06 ENCOUNTER — Other Ambulatory Visit: Payer: Self-pay | Admitting: Internal Medicine

## 2023-03-07 ENCOUNTER — Other Ambulatory Visit: Payer: Self-pay

## 2023-03-07 ENCOUNTER — Other Ambulatory Visit (HOSPITAL_COMMUNITY): Payer: Self-pay

## 2023-03-07 MED ORDER — TORSEMIDE 20 MG PO TABS
20.0000 mg | ORAL_TABLET | Freq: Every day | ORAL | 2 refills | Status: DC
  Filled 2023-03-07: qty 30, 30d supply, fill #0
  Filled 2023-03-13 – 2023-04-15 (×3): qty 30, 30d supply, fill #1
  Filled 2023-05-09: qty 30, 30d supply, fill #2

## 2023-03-09 ENCOUNTER — Other Ambulatory Visit (HOSPITAL_COMMUNITY): Payer: Self-pay

## 2023-03-14 ENCOUNTER — Other Ambulatory Visit (HOSPITAL_COMMUNITY): Payer: Self-pay

## 2023-03-15 ENCOUNTER — Other Ambulatory Visit (HOSPITAL_COMMUNITY): Payer: Self-pay

## 2023-03-26 DIAGNOSIS — M6281 Muscle weakness (generalized): Secondary | ICD-10-CM | POA: Diagnosis not present

## 2023-03-26 DIAGNOSIS — J45909 Unspecified asthma, uncomplicated: Secondary | ICD-10-CM | POA: Diagnosis not present

## 2023-03-26 DIAGNOSIS — R41841 Cognitive communication deficit: Secondary | ICD-10-CM | POA: Diagnosis not present

## 2023-03-26 DIAGNOSIS — R2681 Unsteadiness on feet: Secondary | ICD-10-CM | POA: Diagnosis not present

## 2023-03-26 DIAGNOSIS — I5032 Chronic diastolic (congestive) heart failure: Secondary | ICD-10-CM | POA: Diagnosis not present

## 2023-03-26 DIAGNOSIS — S92314D Nondisplaced fracture of first metatarsal bone, right foot, subsequent encounter for fracture with routine healing: Secondary | ICD-10-CM | POA: Diagnosis not present

## 2023-03-26 DIAGNOSIS — S93324S Dislocation of tarsometatarsal joint of right foot, sequela: Secondary | ICD-10-CM | POA: Diagnosis not present

## 2023-03-26 DIAGNOSIS — R262 Difficulty in walking, not elsewhere classified: Secondary | ICD-10-CM | POA: Diagnosis not present

## 2023-03-28 DIAGNOSIS — E785 Hyperlipidemia, unspecified: Secondary | ICD-10-CM | POA: Diagnosis not present

## 2023-03-28 DIAGNOSIS — R7301 Impaired fasting glucose: Secondary | ICD-10-CM | POA: Diagnosis not present

## 2023-03-28 DIAGNOSIS — Z1212 Encounter for screening for malignant neoplasm of rectum: Secondary | ICD-10-CM | POA: Diagnosis not present

## 2023-03-28 DIAGNOSIS — I2510 Atherosclerotic heart disease of native coronary artery without angina pectoris: Secondary | ICD-10-CM | POA: Diagnosis not present

## 2023-03-28 DIAGNOSIS — R7989 Other specified abnormal findings of blood chemistry: Secondary | ICD-10-CM | POA: Diagnosis not present

## 2023-03-28 DIAGNOSIS — E559 Vitamin D deficiency, unspecified: Secondary | ICD-10-CM | POA: Diagnosis not present

## 2023-03-28 DIAGNOSIS — I5032 Chronic diastolic (congestive) heart failure: Secondary | ICD-10-CM | POA: Diagnosis not present

## 2023-03-28 DIAGNOSIS — N1831 Chronic kidney disease, stage 3a: Secondary | ICD-10-CM | POA: Diagnosis not present

## 2023-03-28 DIAGNOSIS — I130 Hypertensive heart and chronic kidney disease with heart failure and stage 1 through stage 4 chronic kidney disease, or unspecified chronic kidney disease: Secondary | ICD-10-CM | POA: Diagnosis not present

## 2023-03-28 DIAGNOSIS — R351 Nocturia: Secondary | ICD-10-CM | POA: Diagnosis not present

## 2023-03-28 DIAGNOSIS — M8580 Other specified disorders of bone density and structure, unspecified site: Secondary | ICD-10-CM | POA: Diagnosis not present

## 2023-03-29 DIAGNOSIS — Z515 Encounter for palliative care: Secondary | ICD-10-CM | POA: Diagnosis not present

## 2023-03-29 DIAGNOSIS — I712 Thoracic aortic aneurysm, without rupture, unspecified: Secondary | ICD-10-CM | POA: Diagnosis not present

## 2023-04-04 DIAGNOSIS — I130 Hypertensive heart and chronic kidney disease with heart failure and stage 1 through stage 4 chronic kidney disease, or unspecified chronic kidney disease: Secondary | ICD-10-CM | POA: Diagnosis not present

## 2023-04-04 DIAGNOSIS — N1832 Chronic kidney disease, stage 3b: Secondary | ICD-10-CM | POA: Diagnosis not present

## 2023-04-04 DIAGNOSIS — R5383 Other fatigue: Secondary | ICD-10-CM | POA: Diagnosis not present

## 2023-04-04 DIAGNOSIS — R82998 Other abnormal findings in urine: Secondary | ICD-10-CM | POA: Diagnosis not present

## 2023-04-04 DIAGNOSIS — Z0000 Encounter for general adult medical examination without abnormal findings: Secondary | ICD-10-CM | POA: Diagnosis not present

## 2023-04-04 DIAGNOSIS — R351 Nocturia: Secondary | ICD-10-CM | POA: Diagnosis not present

## 2023-04-04 DIAGNOSIS — E785 Hyperlipidemia, unspecified: Secondary | ICD-10-CM | POA: Diagnosis not present

## 2023-04-04 DIAGNOSIS — G4700 Insomnia, unspecified: Secondary | ICD-10-CM | POA: Diagnosis not present

## 2023-04-04 DIAGNOSIS — J449 Chronic obstructive pulmonary disease, unspecified: Secondary | ICD-10-CM | POA: Diagnosis not present

## 2023-04-04 DIAGNOSIS — I2510 Atherosclerotic heart disease of native coronary artery without angina pectoris: Secondary | ICD-10-CM | POA: Diagnosis not present

## 2023-04-04 DIAGNOSIS — I7121 Aneurysm of the ascending aorta, without rupture: Secondary | ICD-10-CM | POA: Diagnosis not present

## 2023-04-04 DIAGNOSIS — I5189 Other ill-defined heart diseases: Secondary | ICD-10-CM | POA: Diagnosis not present

## 2023-04-04 DIAGNOSIS — R911 Solitary pulmonary nodule: Secondary | ICD-10-CM | POA: Diagnosis not present

## 2023-04-05 ENCOUNTER — Other Ambulatory Visit: Payer: Self-pay

## 2023-04-12 ENCOUNTER — Other Ambulatory Visit (HOSPITAL_COMMUNITY): Payer: Self-pay

## 2023-04-12 ENCOUNTER — Other Ambulatory Visit: Payer: Self-pay

## 2023-04-13 ENCOUNTER — Other Ambulatory Visit (HOSPITAL_COMMUNITY): Payer: Self-pay

## 2023-04-15 ENCOUNTER — Other Ambulatory Visit (HOSPITAL_COMMUNITY): Payer: Self-pay

## 2023-04-21 ENCOUNTER — Encounter: Payer: Self-pay | Admitting: Physician Assistant

## 2023-04-21 ENCOUNTER — Ambulatory Visit: Payer: PPO | Admitting: Physician Assistant

## 2023-04-21 ENCOUNTER — Ambulatory Visit: Payer: Self-pay

## 2023-04-21 ENCOUNTER — Ambulatory Visit (INDEPENDENT_AMBULATORY_CARE_PROVIDER_SITE_OTHER): Payer: PPO

## 2023-04-21 ENCOUNTER — Ambulatory Visit (INDEPENDENT_AMBULATORY_CARE_PROVIDER_SITE_OTHER): Payer: PPO | Admitting: Sports Medicine

## 2023-04-21 DIAGNOSIS — G8929 Other chronic pain: Secondary | ICD-10-CM | POA: Diagnosis not present

## 2023-04-21 DIAGNOSIS — M25511 Pain in right shoulder: Secondary | ICD-10-CM | POA: Diagnosis not present

## 2023-04-21 MED ORDER — LIDOCAINE HCL 1 % IJ SOLN
2.0000 mL | INTRAMUSCULAR | Status: AC | PRN
  Administered 2023-04-21: 2 mL

## 2023-04-21 MED ORDER — METHYLPREDNISOLONE ACETATE 40 MG/ML IJ SUSP
80.0000 mg | INTRAMUSCULAR | Status: AC | PRN
  Administered 2023-04-21: 80 mg via INTRA_ARTICULAR

## 2023-04-21 MED ORDER — BUPIVACAINE HCL 0.25 % IJ SOLN
2.0000 mL | INTRAMUSCULAR | Status: AC | PRN
  Administered 2023-04-21: 2 mL via INTRA_ARTICULAR

## 2023-04-21 NOTE — Progress Notes (Signed)
   Procedure Note  [name]             Date of Birth: May 07, 1956           MRN: [id number]             Visit Date: 04/21/2023  Procedures: Visit Diagnoses:  1. Chronic right shoulder pain     Large Joint Inj: R glenohumeral on 04/21/2023 10:21 AM Indications: pain Details: 22 G 3.5 in needle, ultrasound-guided posterior approach Medications: 2 mL lidocaine 1 %; 2 mL bupivacaine 0.25 %; 80 mg methylPREDNISolone acetate 40 MG/ML Outcome: tolerated well, no immediate complications  US-guided glenohumeral joint injection, right shoulder After discussion on risks/benefits/indications, informed verbal consent was obtained. A timeout was then performed. The patient was positioned lying lateral recumbent on examination table. The patient's shoulder was prepped with betadine and multiple alcohol swabs and utilizing ultrasound guidance, the patient's glenohumeral joint was identified on ultrasound. Using ultrasound guidance a 22-gauge, 3.5 inch needle with a mixture of 2:2:2 cc's lidocaine:bupivicaine:depomedrol was directed from a lateral to medial direction via in-plane technique into the glenohumeral joint with visualization of appropriate spread of injectate into the joint. Patient tolerated the procedure well without immediate complications.      Procedure, treatment alternatives, risks and benefits explained, specific risks discussed. Consent was given by the patient. Immediately prior to procedure a time out was called to verify the correct patient, procedure, equipment, support staff and site/side marked as required. Patient was prepped and draped in the usual sterile fashion.    - I evaluated the patient about 5 minutes post-injection and he already had some improvement in pain and range of motion - follow-up with Mikey Kirschner indicated; I am happy to see them as needed  Madelyn Brunner, DO Primary Care Sports Medicine Physician  [facility] -  Orthopedics  This note was dictated using Dragon naturally speaking software and may contain errors in syntax, spelling, or content which have not been identified prior to signing this note.

## 2023-04-21 NOTE — Addendum Note (Signed)
Addended by: Hans Eden on: 04/21/2023 10:02 AM   Modules accepted: Orders

## 2023-04-21 NOTE — Progress Notes (Signed)
Office Visit Note   [name]           Date of Birth: [birth year]           MRN: [id number] Visit Date: 04/21/2023              Requested by: Creola Corn, MD [address] [place],  [place] [address] PCP: Creola Corn, MD   Assessment & Plan: Visit Diagnoses:  1. Chronic right shoulder pain     Plan: Impression is right shoulder rotator cuff arthropathy.  Today, we discussed referral to Dr. [name] for ultrasound-guided cortisone injection in addition to physical therapy.  He is currently not interested in therapy.  He will follow-up with [place] as needed.  Call with concerns or questions.  Follow-Up Instructions: Return if symptoms worsen or fail to improve.   Orders:  Orders Placed This Encounter  Procedures   XR Shoulder Right   No orders of the defined types were placed in this encounter.     Procedures: No procedures performed   Clinical Data: No additional findings.   Subjective: Chief Complaint  Patient presents with   Right Shoulder - Pain    HPI patient is a pleasant 67 year old gentleman who comes in today with right shoulder pain.  He is status post rotator cuff repair several years ago followed by right shoulder arthroscopy by Dr. Magnus Ivan in February 2022.  At that point, he had a retear of the supraspinatus, however this was retracted and was irreparable.  Today, he notes he has had pain for the past 1 to 2 months.  No new injury or change in activity.  The pain is to the entire shoulder and radiates into the deltoid.  Symptoms are worse when he is picking something up as well as with certain movements of the shoulder.  He has been taking Tylenol without significant relief.  He does tell me that he went to physical therapy after the last surgery with Dr. Magnus Ivan which helped for short while.  Currently not interested in going back to therapy.  Review of Systems as detailed in HPI.  All others reviewed and are negative.   Objective: Vital  Signs: There were no vitals taken for this visit.  Physical Exam well-developed well-nourished gentleman no acute distress.  Alert and oriented x 3.  Ortho Exam right shoulder exam reveals forward flexion to about 90 degrees.  Internal rotation to his back pocket.  External rotation to about 75 degrees.  Pain with empty can testing.  3 out of 5 strength with empty can, internal and external rotation.  He is neurovascularly intact distally.  Specialty Comments:  No specialty comments available.  Imaging: XR Shoulder Right  Result Date: 04/21/2023 Moderate degenerative changes to the glenohumeral joint.  No superior migration of the humeral head.    PMFS History: Patient Active Problem List   Diagnosis Date Noted   Dermatochalasia 03/04/2023   Brow ptosis, left 03/04/2023   Acquired mechanical ptosis of left eyelid 03/04/2023   Fall at home, initial encounter 06/24/2022   Hypokalemia 06/24/2022   HLD (hyperlipidemia) 06/24/2022   Closed nondisplaced fracture of first right metatarsal bone 06/23/2022   Hypertensive emergency 01/21/2022   GERD without esophagitis 01/21/2022   Coronary artery disease involving native coronary artery of native heart without angina pectoris 01/21/2022   Acute respiratory failure with hypoxia (HCC) 01/21/2022   Intolerance of continuous positive airway pressure (CPAP) ventilation 08/24/2021   Right lower lobe lung mass 05/13/2021   (  HFpEF) heart failure with preserved ejection fraction (HCC) 05/11/2021   AKI (acute kidney injury) (HCC)    Prolonged QT interval 04/17/2021   COPD exacerbation (HCC)    Noncompliance    Chronic diastolic CHF (congestive heart failure) (HCC) 03/22/2021   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   Chronic intermittent hypoxia with obstructive sleep apnea 12/22/2020   Mild cognitive impairment 12/22/2020   Acute combined systolic and diastolic congestive heart failure (HCC) 12/22/2020   Rotator cuff  arthropathy, right 12/04/2020   Volume overload 12/04/2020   Shortness of breath 12/04/2020   Acute renal failure superimposed on stage 3b chronic kidney disease (HCC) 11/19/2020   Acute respiratory failure (HCC) 11/19/2020   Bacteremia due to methicillin susceptible Staphylococcus aureus (MSSA) 11/19/2020   Cranial neuropathy    Acute encephalopathy 11/18/2020   Chronic right shoulder pain 06/18/2020   OSA (obstructive sleep apnea) 05/05/2020   Hypersomnia with sleep apnea 12/20/2019   Psychophysiological insomnia 09/18/2019   Nocturia more than twice per night 09/18/2019   Renal hypertension 09/18/2019   Moderate asthma 09/18/2019   Tobacco abuse 09/18/2019   Non-restorative sleep 09/18/2019   Nontraumatic complete tear of right rotator cuff 08/15/2019   Delirium 03/16/2019   Stage 3b chronic kidney disease    CVA (cerebral infarction)    Pseudarthrosis 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 02/28/2019   Intervertebral disc disorder with radiculopathy of lumbar region 09/13/2017   Acute gouty arthritis 03/07/2017   Arthralgia of left foot 03/07/2017   Nephrolithiasis    Fever, unspecified 11/02/2015   Essential hypertension 02/07/2015   Pulmonary embolism (HCC) 01/30/2013   Sarcoidosis 01/30/2013   Hemoptysis 01/30/2013   SHOULDER PAIN, LEFT 11/28/2009   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   ED (erectile dysfunction)    GERD (gastroesophageal reflux disease)    Hypercalcemia    Hyperlipidemia    Hypertension    Insomnia    Long-term use of high-risk medication    Nephrocalcinosis    Nephrolithiasis    Obesity    Osteopenia    Pancreatitis    admitted 03/19/01-06/05/01   PE (pulmonary embolism) 01/30/2013   Proteinuria    Sarcoidosis     Sleep apnea    Smoker    Stroke (HCC)     Family History  Problem Relation Age of Onset   Hypertension Father    CVA Father    Lung cancer Father    Alzheimer's disease Mother    Hypertension Sister    Heart failure Sister     Past Surgical History:  Procedure Laterality Date   ABDOMINAL EXPLORATION SURGERY     BACK SURGERY     CHOLECYSTECTOMY     RIGHT/LEFT HEART CATH AND CORONARY ANGIOGRAPHY N/A 05/12/2021   Procedure: RIGHT/LEFT HEART CATH AND CORONARY ANGIOGRAPHY;  Surgeon: Elder Negus, MD;  Location: [facility];  Service: Cardiovascular;  Laterality: N/A;   SHOULDER ARTHROSCOPY Right 10/30/2020   Procedure: ARTHROSCOPY SHOULDER WITH EXTENSIVE DEBRIDEMENT;  Surgeon: Kathryne Hitch, MD;  Location: [facility];  Service: Orthopedics;  Laterality: Right;   SHOULDER SURGERY     TRACHEOSTOMY     closed   Social History   Occupational  History   Not on file  Tobacco Use   Smoking status: Former    Current packs/day: 0.00    Average packs/day: 0.3 packs/day for 15.5 years (3.9 ttl pk-yrs)    Types: Cigarettes    Start date: 09/06/2005    Quit date: 02/23/2021    Years since quitting: 2.1   Smokeless tobacco: Never  Vaping Use   Vaping status: Never Used  Substance and Sexual Activity   Alcohol use: No    Alcohol/week: 0.0 standard drinks of alcohol   Drug use: No   Sexual activity: Yes

## 2023-04-26 DIAGNOSIS — R262 Difficulty in walking, not elsewhere classified: Secondary | ICD-10-CM | POA: Diagnosis not present

## 2023-04-26 DIAGNOSIS — J45909 Unspecified asthma, uncomplicated: Secondary | ICD-10-CM | POA: Diagnosis not present

## 2023-04-26 DIAGNOSIS — R2681 Unsteadiness on feet: Secondary | ICD-10-CM | POA: Diagnosis not present

## 2023-04-26 DIAGNOSIS — S93324S Dislocation of tarsometatarsal joint of right foot, sequela: Secondary | ICD-10-CM | POA: Diagnosis not present

## 2023-04-26 DIAGNOSIS — I5032 Chronic diastolic (congestive) heart failure: Secondary | ICD-10-CM | POA: Diagnosis not present

## 2023-04-26 DIAGNOSIS — R41841 Cognitive communication deficit: Secondary | ICD-10-CM | POA: Diagnosis not present

## 2023-04-26 DIAGNOSIS — M6281 Muscle weakness (generalized): Secondary | ICD-10-CM | POA: Diagnosis not present

## 2023-04-26 DIAGNOSIS — S92314D Nondisplaced fracture of first metatarsal bone, right foot, subsequent encounter for fracture with routine healing: Secondary | ICD-10-CM | POA: Diagnosis not present

## 2023-05-10 ENCOUNTER — Other Ambulatory Visit: Payer: Self-pay

## 2023-05-10 ENCOUNTER — Other Ambulatory Visit (HOSPITAL_COMMUNITY): Payer: Self-pay

## 2023-05-11 ENCOUNTER — Other Ambulatory Visit (HOSPITAL_COMMUNITY): Payer: Self-pay

## 2023-05-17 ENCOUNTER — Encounter (HOSPITAL_COMMUNITY): Payer: Self-pay | Admitting: Emergency Medicine

## 2023-05-17 ENCOUNTER — Other Ambulatory Visit: Payer: Self-pay

## 2023-05-17 ENCOUNTER — Inpatient Hospital Stay (HOSPITAL_COMMUNITY): Payer: PPO

## 2023-05-17 ENCOUNTER — Inpatient Hospital Stay (HOSPITAL_COMMUNITY)
Admission: EM | Admit: 2023-05-17 | Discharge: 2023-05-19 | DRG: 189 | Disposition: A | Discharge status: Home / Self Care | Payer: PPO | Attending: Internal Medicine | Admitting: Internal Medicine

## 2023-05-17 ENCOUNTER — Emergency Department (HOSPITAL_COMMUNITY): Payer: PPO

## 2023-05-17 DIAGNOSIS — Z8249 Family history of ischemic heart disease and other diseases of the circulatory system: Secondary | ICD-10-CM

## 2023-05-17 DIAGNOSIS — B9789 Other viral agents as the cause of diseases classified elsewhere: Secondary | ICD-10-CM | POA: Diagnosis present

## 2023-05-17 DIAGNOSIS — B348 Other viral infections of unspecified site: Secondary | ICD-10-CM

## 2023-05-17 DIAGNOSIS — Z86711 Personal history of pulmonary embolism: Secondary | ICD-10-CM

## 2023-05-17 DIAGNOSIS — Z7982 Long term (current) use of aspirin: Secondary | ICD-10-CM

## 2023-05-17 DIAGNOSIS — I2510 Atherosclerotic heart disease of native coronary artery without angina pectoris: Secondary | ICD-10-CM | POA: Diagnosis present

## 2023-05-17 DIAGNOSIS — Z801 Family history of malignant neoplasm of trachea, bronchus and lung: Secondary | ICD-10-CM

## 2023-05-17 DIAGNOSIS — R0602 Shortness of breath: Secondary | ICD-10-CM | POA: Diagnosis not present

## 2023-05-17 DIAGNOSIS — G9340 Encephalopathy, unspecified: Secondary | ICD-10-CM | POA: Diagnosis present

## 2023-05-17 DIAGNOSIS — Z888 Allergy status to other drugs, medicaments and biological substances status: Secondary | ICD-10-CM

## 2023-05-17 DIAGNOSIS — I130 Hypertensive heart and chronic kidney disease with heart failure and stage 1 through stage 4 chronic kidney disease, or unspecified chronic kidney disease: Secondary | ICD-10-CM | POA: Diagnosis not present

## 2023-05-17 DIAGNOSIS — R0902 Hypoxemia: Secondary | ICD-10-CM

## 2023-05-17 DIAGNOSIS — J9601 Acute respiratory failure with hypoxia: Principal | ICD-10-CM | POA: Diagnosis present

## 2023-05-17 DIAGNOSIS — I10 Essential (primary) hypertension: Secondary | ICD-10-CM | POA: Diagnosis not present

## 2023-05-17 DIAGNOSIS — J9811 Atelectasis: Secondary | ICD-10-CM | POA: Diagnosis not present

## 2023-05-17 DIAGNOSIS — Z9049 Acquired absence of other specified parts of digestive tract: Secondary | ICD-10-CM | POA: Diagnosis not present

## 2023-05-17 DIAGNOSIS — J441 Chronic obstructive pulmonary disease with (acute) exacerbation: Secondary | ICD-10-CM | POA: Diagnosis present

## 2023-05-17 DIAGNOSIS — J449 Chronic obstructive pulmonary disease, unspecified: Secondary | ICD-10-CM | POA: Diagnosis not present

## 2023-05-17 DIAGNOSIS — Z23 Encounter for immunization: Secondary | ICD-10-CM | POA: Diagnosis not present

## 2023-05-17 DIAGNOSIS — R590 Localized enlarged lymph nodes: Secondary | ICD-10-CM | POA: Diagnosis not present

## 2023-05-17 DIAGNOSIS — Z8673 Personal history of transient ischemic attack (TIA), and cerebral infarction without residual deficits: Secondary | ICD-10-CM

## 2023-05-17 DIAGNOSIS — G9341 Metabolic encephalopathy: Secondary | ICD-10-CM | POA: Diagnosis not present

## 2023-05-17 DIAGNOSIS — Z87891 Personal history of nicotine dependence: Secondary | ICD-10-CM

## 2023-05-17 DIAGNOSIS — Z823 Family history of stroke: Secondary | ICD-10-CM | POA: Diagnosis not present

## 2023-05-17 DIAGNOSIS — Z1152 Encounter for screening for COVID-19: Secondary | ICD-10-CM | POA: Diagnosis not present

## 2023-05-17 DIAGNOSIS — Z885 Allergy status to narcotic agent status: Secondary | ICD-10-CM

## 2023-05-17 DIAGNOSIS — J9801 Acute bronchospasm: Secondary | ICD-10-CM | POA: Diagnosis not present

## 2023-05-17 DIAGNOSIS — R9431 Abnormal electrocardiogram [ECG] [EKG]: Secondary | ICD-10-CM | POA: Diagnosis not present

## 2023-05-17 DIAGNOSIS — R918 Other nonspecific abnormal finding of lung field: Secondary | ICD-10-CM | POA: Diagnosis not present

## 2023-05-17 DIAGNOSIS — E785 Hyperlipidemia, unspecified: Secondary | ICD-10-CM | POA: Diagnosis present

## 2023-05-17 DIAGNOSIS — D72829 Elevated white blood cell count, unspecified: Secondary | ICD-10-CM | POA: Diagnosis not present

## 2023-05-17 DIAGNOSIS — Z79899 Other long term (current) drug therapy: Secondary | ICD-10-CM

## 2023-05-17 DIAGNOSIS — N1832 Chronic kidney disease, stage 3b: Secondary | ICD-10-CM | POA: Diagnosis not present

## 2023-05-17 DIAGNOSIS — I5030 Unspecified diastolic (congestive) heart failure: Secondary | ICD-10-CM | POA: Diagnosis present

## 2023-05-17 DIAGNOSIS — Z820 Family history of epilepsy and other diseases of the nervous system: Secondary | ICD-10-CM | POA: Diagnosis not present

## 2023-05-17 DIAGNOSIS — I5032 Chronic diastolic (congestive) heart failure: Secondary | ICD-10-CM | POA: Diagnosis present

## 2023-05-17 LAB — COMPREHENSIVE METABOLIC PANEL
ALT: 13 U/L (ref 0–44)
AST: 20 U/L (ref 15–41)
Albumin: 3.8 g/dL (ref 3.5–5.0)
Alkaline Phosphatase: 81 U/L (ref 38–126)
Anion gap: 11 (ref 5–15)
BUN: 17 mg/dL (ref 8–23)
CO2: 25 mmol/L (ref 22–32)
Calcium: 9 mg/dL (ref 8.9–10.3)
Chloride: 106 mmol/L (ref 98–111)
Creatinine, Ser: 1.54 mg/dL — ABNORMAL HIGH (ref 0.61–1.24)
GFR, Estimated: 49 mL/min — ABNORMAL LOW (ref >60)
Glucose, Bld: 104 mg/dL — ABNORMAL HIGH (ref 70–99)
Potassium: 3.6 mmol/L (ref 3.5–5.1)
Sodium: 142 mmol/L (ref 135–145)
Total Bilirubin: 0.6 mg/dL (ref 0.3–1.2)
Total Protein: 6.9 g/dL (ref 6.5–8.1)

## 2023-05-17 LAB — I-STAT ARTERIAL BLOOD GAS, ED
Acid-base deficit: 1 mmol/L (ref 0.0–2.0)
Bicarbonate: 24.2 mmol/L (ref 20.0–28.0)
Calcium, Ion: 1.22 mmol/L (ref 1.15–1.40)
HCT: 44 % (ref 39.0–52.0)
Hemoglobin: 15 g/dL (ref 13.0–17.0)
O2 Saturation: 92 %
Patient temperature: 97.1
Potassium: 4.1 mmol/L (ref 3.5–5.1)
Sodium: 136 mmol/L (ref 135–145)
TCO2: 25 mmol/L (ref 22–32)
pCO2 arterial: 40.2 mmHg (ref 32–48)
pH, Arterial: 7.385 (ref 7.35–7.45)
pO2, Arterial: 62 mmHg — ABNORMAL LOW (ref 83–108)

## 2023-05-17 LAB — RESP PANEL BY RT-PCR (RSV, FLU A&B, COVID)  RVPGX2
Influenza A by PCR: NEGATIVE
Influenza B by PCR: NEGATIVE
Resp Syncytial Virus by PCR: NEGATIVE
SARS Coronavirus 2 by RT PCR: NEGATIVE

## 2023-05-17 LAB — CBC
HCT: 45.9 % (ref 39.0–52.0)
Hemoglobin: 15.1 g/dL (ref 13.0–17.0)
MCH: 30.9 pg (ref 26.0–34.0)
MCHC: 32.9 g/dL (ref 30.0–36.0)
MCV: 93.9 fL (ref 80.0–100.0)
Platelets: 194 10*3/uL (ref 150–400)
RBC: 4.89 MIL/uL (ref 4.22–5.81)
RDW: 14.6 % (ref 11.5–15.5)
WBC: 11.3 10*3/uL — ABNORMAL HIGH (ref 4.0–10.5)
nRBC: 0 % (ref 0.0–0.2)

## 2023-05-17 LAB — D-DIMER, QUANTITATIVE: D-Dimer, Quant: 0.75 ug{FEU}/mL — ABNORMAL HIGH (ref 0.00–0.50)

## 2023-05-17 LAB — BRAIN NATRIURETIC PEPTIDE: B Natriuretic Peptide: 507 pg/mL — ABNORMAL HIGH (ref 0.0–100.0)

## 2023-05-17 LAB — PROCALCITONIN: Procalcitonin: <0.1 ng/mL

## 2023-05-17 MED ORDER — INFLUENZA VAC A&B SURF ANT ADJ 0.5 ML IM SUSY
0.5000 mL | PREFILLED_SYRINGE | INTRAMUSCULAR | Status: AC
  Administered 2023-05-18: 0.5 mL via INTRAMUSCULAR
  Filled 2023-05-17: qty 0.5

## 2023-05-17 MED ORDER — METHYLPREDNISOLONE SODIUM SUCC 125 MG IJ SOLR
125.0000 mg | Freq: Once | INTRAMUSCULAR | Status: AC
  Administered 2023-05-17: 125 mg via INTRAVENOUS
  Filled 2023-05-17: qty 2

## 2023-05-17 MED ORDER — ACETAMINOPHEN 650 MG RE SUPP: 650.0000 mg | Freq: Four times a day (QID) | RECTAL | Status: DC | PRN

## 2023-05-17 MED ORDER — IPRATROPIUM-ALBUTEROL 0.5-2.5 (3) MG/3ML IN SOLN
3.0000 mL | Freq: Four times a day (QID) | RESPIRATORY_TRACT | Status: DC
  Administered 2023-05-17 – 2023-05-18 (×4): 3 mL via RESPIRATORY_TRACT
  Filled 2023-05-17 (×4): qty 3

## 2023-05-17 MED ORDER — ALBUTEROL SULFATE (2.5 MG/3ML) 0.083% IN NEBU: 2.5000 mg | INHALATION_SOLUTION | RESPIRATORY_TRACT | Status: DC | PRN

## 2023-05-17 MED ORDER — PREDNISONE 20 MG PO TABS
40.0000 mg | ORAL_TABLET | Freq: Every day | ORAL | Status: DC
  Administered 2023-05-18 – 2023-05-19 (×2): 40 mg via ORAL
  Filled 2023-05-17 (×2): qty 2

## 2023-05-17 MED ORDER — ACETAMINOPHEN 325 MG PO TABS
650.0000 mg | ORAL_TABLET | Freq: Four times a day (QID) | ORAL | Status: DC | PRN
  Administered 2023-05-18: 650 mg via ORAL
  Filled 2023-05-17: qty 2

## 2023-05-17 MED ORDER — SODIUM CHLORIDE 0.9 % IV SOLN
2.0000 g | INTRAVENOUS | Status: DC
  Administered 2023-05-17: 2 g via INTRAVENOUS
  Filled 2023-05-17: qty 20

## 2023-05-17 MED ORDER — SODIUM CHLORIDE 0.9 % IV SOLN
100.0000 mg | Freq: Two times a day (BID) | INTRAVENOUS | Status: DC
  Administered 2023-05-17 – 2023-05-18 (×2): 100 mg via INTRAVENOUS
  Filled 2023-05-17 (×2): qty 100

## 2023-05-17 MED ORDER — TRIMETHOBENZAMIDE HCL 100 MG/ML IM SOLN: 200.0000 mg | Freq: Four times a day (QID) | INTRAMUSCULAR | Status: DC | PRN

## 2023-05-17 MED ORDER — GUAIFENESIN ER 600 MG PO TB12
600.0000 mg | ORAL_TABLET | Freq: Two times a day (BID) | ORAL | Status: DC
  Administered 2023-05-17 – 2023-05-19 (×5): 600 mg via ORAL
  Filled 2023-05-17 (×5): qty 1

## 2023-05-17 MED ORDER — TRAZODONE HCL 50 MG PO TABS
50.0000 mg | ORAL_TABLET | Freq: Once | ORAL | Status: AC
  Administered 2023-05-17: 50 mg via ORAL
  Filled 2023-05-17: qty 1

## 2023-05-17 MED ORDER — IPRATROPIUM-ALBUTEROL 0.5-2.5 (3) MG/3ML IN SOLN
3.0000 mL | Freq: Once | RESPIRATORY_TRACT | Status: AC
  Administered 2023-05-17: 3 mL via RESPIRATORY_TRACT
  Filled 2023-05-17: qty 3

## 2023-05-17 MED ORDER — IOHEXOL 350 MG/ML SOLN
65.0000 mL | Freq: Once | INTRAVENOUS | Status: AC | PRN
  Administered 2023-05-17: 65 mL via INTRAVENOUS

## 2023-05-17 MED ORDER — HYDRALAZINE HCL 50 MG PO TABS
100.0000 mg | ORAL_TABLET | Freq: Two times a day (BID) | ORAL | Status: DC
  Administered 2023-05-18 – 2023-05-19 (×4): 100 mg via ORAL
  Filled 2023-05-17 (×4): qty 2

## 2023-05-17 MED ORDER — SODIUM CHLORIDE 0.9% FLUSH
3.0000 mL | Freq: Two times a day (BID) | INTRAVENOUS | Status: DC
  Administered 2023-05-17 – 2023-05-19 (×5): 3 mL via INTRAVENOUS

## 2023-05-17 MED ORDER — ENOXAPARIN SODIUM 40 MG/0.4ML IJ SOSY
40.0000 mg | PREFILLED_SYRINGE | INTRAMUSCULAR | Status: DC
  Administered 2023-05-17 – 2023-05-18 (×2): 40 mg via SUBCUTANEOUS
  Filled 2023-05-17 (×2): qty 0.4

## 2023-05-17 NOTE — ED Triage Notes (Signed)
Patient arrives in wheelchair by POV with wife c/o shortness of breath onset of last night. Reports cough since Sunday. Took home covid test this am and negative. Reports grandson has been sick. Oxygen saturation 84% in triage and placed on 4L [facility]. Patient reports hx of heart failure- denies any swelling.

## 2023-05-17 NOTE — H&P (Signed)
History and Physical    [name] WUJ:[id number] DOB: 10/01/1955 DOA: 05/17/2023 DOS: the patient was seen and examined on 05/17/2023 PCP: Creola Corn, MD  Patient coming from: Home via EMS  Chief Complaint:  Chief Complaint  Patient presents with   Shortness of Breath   HPI: [name] is a 67 y.o. male with medical history significant of hypertension, hyperlipidemia, diastolic dysfunction (last EF 60 to 65% with grade 1 diastolic dysfunction), sarcoidosis, pulmonary embolism, CVA, CKD stage III, tobacco abuse, and AAA who presents with complaints of shortness of breath.  Patient notes symptoms have been present possibly over the last few weeks, but acutely got worse last night.  He had a productive cough with subjective fever/chills,  wheezing, and orthopnea.  He took a at home COVID test which was negative.  He denies having significant chest pain, nausea, vomiting, diarrhea, or abdominal pain. His wife checked a pulse oximetry and noted to be 87-88% on room air.  Patient normally is not on oxygen at baseline and does not have any inhalers.  His 4 grandchildren live with him and his wife.  One of his grandchildren had been having some breathing difficulties earlier this week for which they thought they could have some virus.  Wife reports that he has been smoking up until about 4 to 5 weeks ago and smoked for approximately 15 to 20 years 1/2 a pack of cigarettes per day on average.  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  In the emergency department patient was noted to be afebrile with tachypnea, blood pressures elevated 178/119, and O2 saturations down to be as low as 84% in triage in room air with remained on 4 L nasal cannula oxygen.  Labs significant for WBC 11.3, BUN 17, creatinine 1.54, BNP 507, and  D-dimer 0.75.  Chest x-ray noted no focal opacity.  Influenza, RSV, and COVID-19 screening were negative.  Patient had been given 125 mg of Solu-Medrol breathing treatment.  Patient wife makes note that he seems to be little confused with repeated questions.   Review of Systems: As mentioned in the history of present illness. All other systems reviewed and are negative. 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   ED (erectile dysfunction)    GERD (gastroesophageal reflux disease)    Hypercalcemia    Hyperlipidemia    Hypertension    Insomnia    Long-term use of high-risk medication    Nephrocalcinosis    Nephrolithiasis    Obesity    Osteopenia    Pancreatitis    admitted 03/19/01-06/05/01   PE (pulmonary embolism) 01/30/2013   Proteinuria    Sarcoidosis    Sleep apnea    Smoker    Stroke [facility])    Past Surgical History:  Procedure Laterality Date   ABDOMINAL EXPLORATION SURGERY     BACK SURGERY     CHOLECYSTECTOMY     RIGHT/LEFT HEART CATH AND CORONARY ANGIOGRAPHY N/A 05/12/2021   Procedure: RIGHT/LEFT HEART CATH AND CORONARY ANGIOGRAPHY;  Surgeon:  Patwardhan, Anabel Bene, MD;  Location: [facility];  Service: Cardiovascular;  Laterality: N/A;   SHOULDER ARTHROSCOPY Right 10/30/2020   Procedure: ARTHROSCOPY SHOULDER WITH EXTENSIVE DEBRIDEMENT;  Surgeon: Kathryne Hitch, MD;  Location: [facility];  Service: Orthopedics;  Laterality: Right;   SHOULDER SURGERY     TRACHEOSTOMY     closed   Social History:  reports that he quit smoking about 2 years ago. His smoking use included cigarettes. He started smoking about 17 years ago. He has a 3.9 pack-year smoking history. He has never used smokeless tobacco. He reports that he  does not drink alcohol and does not use drugs.  Allergies  Allergen Reactions   Codeine Nausea And Vomiting   Hydrochlorothiazide Other (See Comments)    Unknown per Pt.    Hydrocodone-Acetaminophen Nausea And Vomiting   Hydromorphone Other (See Comments)    hallucinations     Family History  Problem Relation Age of Onset   Hypertension Father    CVA Father    Lung cancer Father    Alzheimer's disease Mother    Hypertension Sister    Heart failure Sister     Prior to Admission medications   Medication Sig Start Date End Date Taking? Authorizing Provider  ascorbic acid (VITAMIN C) 500 MG tablet Take 1 tablet (500 mg total) by mouth daily. 06/25/22   Montez Morita, PA-C  aspirin EC 81 MG tablet Take 81 mg by mouth daily. Swallow whole.    [provider]  atorvastatin (LIPITOR) 20 MG tablet Take 20 mg by mouth daily.    [provider]  Cholecalciferol (VITAMIN D) 125 MCG (5000 UT) CAPS Take 5,000 Units by mouth daily.    [provider]  dapagliflozin propanediol (FARXIGA) 10 MG TABS tablet Take 1 tablet (10 mg total) by mouth daily. 05/24/22 06/14/23  Custovic, Rozell Searing, DO  diltiazem (CARDIZEM CD) 360 MG 24 hr capsule Take 1 capsule (360 mg total) by mouth daily. 11/17/22   Custovic, Rozell Searing, DO  furosemide (LASIX) 20 MG tablet Take 20 mg by mouth daily.    [provider]  hydrALAZINE (APRESOLINE) 100 MG tablet Take 100 mg by mouth 3 (three) times daily.    [provider]  hydrALAZINE (APRESOLINE) 50 MG tablet Take 50 mg by mouth. 12/14/18   [provider]  isosorbide mononitrate (IMDUR) 30 MG 24 hr tablet Take 3 tablets (90 mg total) by mouth daily. 09/22/22     metoprolol (TOPROL-XL) 200 MG 24 hr tablet Take 1 tablet (200 mg total) by mouth daily. 06/20/22     potassium chloride SA (KLOR-CON M) 20 MEQ tablet Take 1 tablet (20 mEq total) by mouth daily. 12/17/22   Tolia, Sunit, DO  sacubitril-valsartan (ENTRESTO) 97-103 MG Take 1  tablet by mouth 2 (two) times daily. 10/04/22   Custovic, Rozell Searing, DO  tamsulosin (FLOMAX) 0.4 MG CAPS capsule Take 1 capsule by mouth daily 10/23/22     torsemide (DEMADEX) 20 MG tablet Take 1 tablet (20 mg total) by mouth daily. 03/07/23   Tolia, Sunit, DO  traZODone (DESYREL) 150 MG tablet Take 1 tablet (150 mg total) by mouth at bedtime as needed for sleep. Patient taking differently: Take 150 mg by mouth at bedtime. 12/22/20   Dohmeier, Porfirio Mylar, MD  triamcinolone cream (KENALOG) 0.1 % Apply to affected areas twice a day as needed for itching/inflammation. Patient taking differently: Apply 1 Application topically as needed (itching). 09/09/21       Physical  Exam: Vitals:   05/17/23 0815 05/17/23 0830 05/17/23 0845 05/17/23 0900  BP: (!) 157/108 (!) 161/106 (!) 151/101 (!) 146/103  Pulse: 84 79 80 80  Resp: (!) 29 (!) 25 (!) 31 (!) 37  Temp:      SpO2: 96% 95% 95% 95%  Weight:      Height:         Constitutional: Older male currently in no acute distress sitting up in the hospital Eyes: PERRL, lids and conjunctivae normal ENMT: Mucous membranes are moist.  Neck: normal, supple, no significant JVD appreciated at this time. Respiratory: Patient with expiratory wheezes heard in both lung fields.  Currently patient on 3 L of nasal cannula oxygen with O2 saturations maintained. Cardiovascular: Regular rate and rhythm, no murmurs / rubs / gallops. No extremity edema.  Abdomen: no tenderness, no masses palpated. Bowel sounds positive.  Musculoskeletal: no clubbing / cyanosis. No joint deformity upper and lower extremities. Good ROM, no contractures. Normal muscle tone.  Skin: no rashes, lesions, ulcers. No induration Neurologic: CN 2-12 grossly intact.  Strength 5/5 in all 4.  Psychiatric: Patient is alert and seems oriented x 3, but did as repeated questions  Data Reviewed:   EKG reveals sinus rhythm 85 bpm with probable left atrial enlargement and QTc 537.  Reviewed labs, imaging, and  pertinent records as noted above in HPI.  Assessment and Plan: Acute respiratory failure with hypoxia COPD exacerbation Patient presents with complaints of increased productive cough with shortness of breath wheezing.  O2 saturations were noted to be as low as 84% on room air with improvement on 3 L nasal cannula oxygen.  Patient noted to have wheezing on physical exam.  Chest x-ray noted no acute focal opacity.  Influenza, RSV, and COVID-19 screening were negative.  D-dimer was elevated at 0.75.  Suspect COPD exacerbation due to history of tobacco use.  Patient has been given Solu-Medrol 125 mg IV and DuoNeb breathing treatment. -Admit to a telemetry bed -Continuous pulse oximetry with nasal cannula oxygen maintain O2 saturation greater than 92% -Check complete respiratory virus panel given reports of recent possible sick contacts -Check ABG and due to reports of confusion -Check CT angiogram of the chest -DuoNebs 4 times daily and albuterol nebs as needed for shortness of breath/wheezing -Rocephin and doxycycline IV -Mucinex  Leukocytosis Acute.  WBC elevated at 11.3.  Initial chest x-ray noted no acute abnormality. -Check procalcitonin -Check urinalysis  Acute metabolic encephalopathy Wife notes patient to be little confused and reported that he was asking repeated questions.  Question if patient symptoms are related to him being hypercapnic vs. Infection vs. other. -Neurochecks -Follow-up ABG  Heart failure with preserved EF Chronic. On physical exam patient with no lower extremity edema or significant signs of JVD, but did report having orthopnea.  BNP was also noted to be elevated at 5.4.  Chest x-ray noted stable cardiac silhouette.  Last echocardiogram noted EF to be 55 to 60% with indeterminate diastolic parameters back in 05/2022.  Patient is followed by Dr. Birdena Crandall in the outpatient setting. -Strict intake and output -Daily weights -Check echocardiogram -Consider need of Lasix  IV  Prolonged QT interval QTc was elevated at 537. -Correct electrolyte abnormalities -Avoid QT prolonging medications  Chronic kidney disease stage IIIa Creatinine 1.54 which appears improved from priors baseline which range from 1.7-2. -Recheck kidney function in a.m.  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 -Follow-up CT angiogram of the chest  DVT prophylaxis: Lovenox Advance Care Planning:   Code Status: Full Code   Consults: None  Family Communication: Wife updated at bedside  Severity of Illness: The appropriate patient status for this patient is INPATIENT. Inpatient status is judged to be reasonable and necessary in order to provide the required intensity of service to ensure the patient's safety. The patient's presenting symptoms, physical exam findings, and initial radiographic and laboratory data in the context of their chronic comorbidities is felt to place them at high risk for further clinical deterioration. Furthermore, it is not anticipated that the patient will be medically stable for discharge from the hospital within 2 midnights of admission.   * I certify that at the point of admission it is my clinical judgment that the patient will require inpatient hospital care spanning beyond 2 midnights from the point of admission due to high intensity of service, high risk for further deterioration and high frequency of surveillance required.*  Author: Clydie Braun, MD 05/17/2023 9:55 AM  For on call review [email or web address].

## 2023-05-17 NOTE — ED Provider Notes (Signed)
[place] EMERGENCY DEPARTMENT AT [facility] Provider Note   CSN: [id number] Arrival date & time: 05/17/23  0700     History  Chief Complaint  Patient presents with   Shortness of Breath    [name] is a 67 y.o. male.   Shortness of Breath    Pt has history of sarcoidosis, PE, htn, chronic kidney disease, asthma CHF coronary artery disease who presents ED with complaints of shortness of breath.  Patient states yesterday he started having trouble with cough and congestion.  He started to feel somewhat feverish.  He began feeling short of breath and became worse this morning.  He has been coughing since Sunday.  He is not having any chest pain.  Home Medications Prior to Admission medications   Medication Sig Start Date End Date Taking? Authorizing Provider  ascorbic acid (VITAMIN C) 500 MG tablet Take 1 tablet (500 mg total) by mouth daily. 06/25/22   Montez Morita, PA-C  aspirin EC 81 MG tablet Take 81 mg by mouth daily. Swallow whole.    [provider]  atorvastatin (LIPITOR) 20 MG tablet Take 20 mg by mouth daily.    [provider]  Cholecalciferol (VITAMIN D) 125 MCG (5000 UT) CAPS Take 5,000 Units by mouth daily.    [provider]  dapagliflozin propanediol (FARXIGA) 10 MG TABS tablet Take 1 tablet (10 mg total) by mouth daily. 05/24/22 06/14/23  Custovic, Rozell Searing, DO  diltiazem (CARDIZEM CD) 360 MG 24 hr capsule Take 1 capsule (360 mg total) by mouth daily. 11/17/22   Custovic, Rozell Searing, DO  furosemide (LASIX) 20 MG tablet Take 20 mg by mouth daily.    [provider]  hydrALAZINE (APRESOLINE) 100 MG tablet Take 100 mg by mouth 3 (three) times daily.    [provider]  hydrALAZINE (APRESOLINE) 50 MG tablet Take 50 mg by mouth. 12/14/18   [provider]  isosorbide mononitrate (IMDUR) 30 MG 24 hr tablet Take 3 tablets (90 mg total) by mouth daily. 09/22/22     metoprolol (TOPROL-XL) 200 MG 24 hr tablet Take 1  tablet (200 mg total) by mouth daily. 06/20/22     potassium chloride SA (KLOR-CON M) 20 MEQ tablet Take 1 tablet (20 mEq total) by mouth daily. 12/17/22   Tolia, Sunit, DO  sacubitril-valsartan (ENTRESTO) 97-103 MG Take 1 tablet by mouth 2 (two) times daily. 10/04/22   Custovic, Rozell Searing, DO  tamsulosin (FLOMAX) 0.4 MG CAPS capsule Take 1 capsule by mouth daily 10/23/22     torsemide (DEMADEX) 20 MG tablet Take 1 tablet (20 mg total) by mouth daily. 03/07/23   Tolia, Sunit, DO  traZODone (DESYREL) 150 MG tablet Take 1 tablet (150 mg total) by mouth at bedtime as needed for sleep. Patient taking differently: Take 150 mg by mouth at bedtime. 12/22/20   Dohmeier, Porfirio Mylar, MD  triamcinolone cream (KENALOG) 0.1 % Apply to affected areas twice a day as needed for itching/inflammation. Patient taking differently: Apply 1 Application topically as needed (itching). 09/09/21         Allergies    Codeine, Hydrochlorothiazide, Hydrocodone-acetaminophen, and Hydromorphone    Review of Systems   Review of Systems  Respiratory:  Positive for shortness of breath.     Physical Exam Updated Vital Signs BP (!) 142/102   Pulse 81   Temp 98.9 F (37.2 C)   Resp (!) 24   Ht 1.829 m (6')   Wt 94.8 kg   SpO2 95%  BMI 28.35 kg/m  Physical Exam Vitals and nursing note reviewed.  Constitutional:      Appearance: He is well-developed. He is not diaphoretic.  HENT:     Head: Normocephalic and atraumatic.     Right Ear: External ear normal.     Left Ear: External ear normal.  Eyes:     General: No scleral icterus.       Right eye: No discharge.        Left eye: No discharge.     Conjunctiva/sclera: Conjunctivae normal.  Neck:     Trachea: No tracheal deviation.  Cardiovascular:     Rate and Rhythm: Normal rate and regular rhythm.  Pulmonary:     Effort: No accessory muscle usage or respiratory distress.     Breath sounds: No stridor. Wheezing present. No rales.  Abdominal:     General: Bowel sounds are  normal. There is no distension.     Palpations: Abdomen is soft.     Tenderness: There is no abdominal tenderness. There is no guarding or rebound.  Musculoskeletal:        General: No tenderness or deformity.     Cervical back: Neck supple.     Right lower leg: No edema.     Left lower leg: No edema.  Skin:    General: Skin is warm and dry.     Findings: No rash.  Neurological:     General: No focal deficit present.     Mental Status: He is alert.     Cranial Nerves: No cranial nerve deficit, dysarthria or facial asymmetry.     Sensory: No sensory deficit.     Motor: No abnormal muscle tone or seizure activity.     Coordination: Coordination normal.  Psychiatric:        Mood and Affect: Mood normal.     ED Results / Procedures / Treatments   Labs (all labs ordered are listed, but only abnormal results are displayed) Labs Reviewed  COMPREHENSIVE METABOLIC PANEL - Abnormal; Notable for the following components:      Result Value   Glucose, Bld 104 (*)    Creatinine, Ser 1.54 (*)    GFR, Estimated 49 (*)    All other components within normal limits  CBC - Abnormal; Notable for the following components:   WBC 11.3 (*)    All other components within normal limits  BRAIN NATRIURETIC PEPTIDE - Abnormal; Notable for the following components:   B Natriuretic Peptide 507.0 (*)    All other components within normal limits  RESP PANEL BY RT-PCR (RSV, FLU A&B, COVID)  [id number]  D-DIMER, QUANTITATIVE    EKG EKG Interpretation Date/Time:  Tuesday May 17 2023 07:50:58 EDT Ventricular Rate:  85 PR Interval:  180 QRS Duration:  96 QT Interval:  451 QTC Calculation: 537 R Axis:   -88  Text Interpretation: Sinus rhythm Probable left atrial enlargement Left anterior fascicular block Anteroseptal infarct, old Repol abnrm suggests ischemia, inferior leads Prolonged QT interval No significant change since last tracing Confirmed by Linwood Dibbles [phone]) on 05/17/2023 8:32:12  AM  Radiology DG Chest Portable 1 View  Result Date: 05/17/2023 CLINICAL DATA:  shortness of breath EXAM: PORTABLE CHEST 1 VIEW COMPARISON:  CXR 01/21/22 FINDINGS: No pleural effusion. No pneumothorax. No focal airspace opacity. Unchanged cardiac and mediastinal contours. No radiographically apparent displaced rib fractures. Visualized upper abdomen unremarkable. IMPRESSION: No focal airspace opacity Electronically Signed   By: Lorenza Cambridge M.D.   On: 05/17/2023  08:33    Procedures Procedures    Medications Ordered in ED Medications  ipratropium-albuterol (DUONEB) 0.5-2.5 (3) MG/3ML nebulizer solution 3 mL (3 mLs Nebulization Given 05/17/23 0754)  methylPREDNISolone sodium succinate (SOLU-MEDROL) 125 mg/2 mL injection 125 mg (125 mg Intravenous Given 05/17/23 0801)    ED Course/ Medical Decision Making/ A&P Clinical Course as of 05/17/23 1007  Tue May 17, 2023  0936 BNP is elevated at 507.  RSV COVID test negative.  Metabolic panel shows increased creatinine 1.54.  CBC normal [JK]  0944 Chest x-ray without acute findings [JK]  0949 Nasal cannula oxygen removed and oxygen saturations dropped back to 88.  Patient started back on 3 L nasal cannula oxygen [JK]  1006 Case discussed with Dr. Katrinka Blazing [JK]    Clinical Course User Index [JK] Linwood Dibbles, MD                                 Medical Decision Making Parental diagnosis includes but not limited to COPD exacerbation, pneumonia, pneumothorax, CHF, pulmonary embolism.  Problems Addressed: Bronchospasm: acute illness or injury that poses a threat to life or bodily functions Hypoxia: acute illness or injury that poses a threat to life or bodily functions  Amount and/or Complexity of Data Reviewed Labs: ordered. Radiology: ordered.  Risk Prescription drug management. Decision regarding hospitalization.   Patient presented to the ED for evaluation of shortness of breath.  Patient noted to have an oxygen requirement on arrival.   Patient normally does not have breathing issues.  Records however indicate he does have history of asthma and former tobacco use copd.  On my exam initially patient was wheezing.  However he does not usually use home treatments.  He was treated with steroids and a nebulizer treatment with improvement.  On repeat exam no persistent wheezing.  Consider the possibility of CHF exacerbation.  BNP is slightly increased however x-ray not suggestive of CHF.  Presentation also more suggestive of bronchospasm.  No evidence of pneumonia on x-ray.  COVID flu are negative.  Will add on D-dimer considering PE as a cause for his new oxygen requirement although I think it is most likely related to his bronchospasm.  Patient however still has an oxygen requirement.  Will consult the medical service for admission.        Final Clinical Impression(s) / ED Diagnoses Final diagnoses:  Bronchospasm  Hypoxia  Hypertension, unspecified type    Rx / DC Orders ED Discharge Orders     None         Linwood Dibbles, MD 05/17/23 1007

## 2023-05-17 NOTE — ED Notes (Signed)
ED TO INPATIENT HANDOFF REPORT  ED Nurse Name and Phone #: Vernona Rieger [phone]  S Name/Age/Gender Derald Macleod 67 y.o. male Room/Bed: 041C/041C  Code Status   Code Status: Full Code  Home/SNF/Other Home Patient oriented to: self, place, time, and situation Is this baseline? Yes   Triage Complete: Triage complete  Chief Complaint COPD exacerbation (HCC) [J44.1] Acute respiratory failure with hypoxia (HCC) [J96.01]  Triage Note Patient arrives in wheelchair by POV with wife c/o shortness of breath onset of last night. Reports cough since Sunday. Took home covid test this am and negative. Reports grandson has been sick. Oxygen saturation 84% in triage and placed on 4L [place]. Patient reports hx of heart failure- denies any swelling.    Allergies Allergies  Allergen Reactions   Codeine Nausea And Vomiting   Hydrochlorothiazide Other (See Comments)    Unknown per Pt.    Hydrocodone-Acetaminophen Nausea And Vomiting   Hydromorphone Other (See Comments)    hallucinations     Level of Care/Admitting Diagnosis ED Disposition     ED Disposition  Admit   Condition  --   Comment  Hospital [facility]: [facility] [[id number]]  Level of Care: Telemetry Medical [104]  May admit patient to [facility] or [facility] if equivalent level of care is available:: No  Covid Evaluation: Asymptomatic - no recent exposure (last 10 days) testing not required  Diagnosis: Acute respiratory failure with hypoxia [facility]) [[id number]]  Admitting Physician: Clydie Braun [[id number]]  Attending Physician: Clydie Braun [[id number]]  Certification:: I certify this patient will need inpatient services for at least 2 midnights          B Medical/Surgery History Past Medical History:  Diagnosis Date   Back pain    CHF (congestive heart failure) (HCC)    CKD (chronic kidney disease), stage III (HCC)    CVA (cerebral infarction)    Diastolic dysfunction    Dilation of thoracic aorta (HCC)    4.c  cm ascending thoracic aorta 04/21/21 CT   ED (erectile dysfunction)    GERD (gastroesophageal reflux disease)    Hypercalcemia    Hyperlipidemia    Hypertension    Insomnia    Long-term use of high-risk medication    Nephrocalcinosis    Nephrolithiasis    Obesity    Osteopenia    Pancreatitis    admitted 03/19/01-06/05/01   PE (pulmonary embolism) 01/30/2013   Proteinuria    Sarcoidosis    Sleep apnea    Smoker    Stroke [facility]    Past Surgical History:  Procedure Laterality Date   ABDOMINAL EXPLORATION SURGERY     BACK SURGERY     CHOLECYSTECTOMY     RIGHT/LEFT HEART CATH AND CORONARY ANGIOGRAPHY N/A 05/12/2021   Procedure: RIGHT/LEFT HEART CATH AND CORONARY ANGIOGRAPHY;  Surgeon: Elder Negus, MD;  Location: [facility] INVASIVE CV LAB;  Service: Cardiovascular;  Laterality: N/A;   SHOULDER ARTHROSCOPY Right 10/30/2020   Procedure: ARTHROSCOPY SHOULDER WITH EXTENSIVE DEBRIDEMENT;  Surgeon: Kathryne Hitch, MD;  Location: [facility];  Service: Orthopedics;  Laterality: Right;   SHOULDER SURGERY     TRACHEOSTOMY     closed     A IV Location/Drains/Wounds Patient Lines/Drains/Airways Status     Active Line/Drains/Airways     Name Placement date Placement time Site Days   Peripheral IV 05/17/23 20 G 1" Anterior;Right Forearm 05/17/23  0737  Forearm  less than 1  Intake/Output Last 24 hours No intake or output data in the 24 hours ending 05/17/23 1622  Labs/Imaging Results for orders placed or performed during the hospital encounter of 05/17/23 (from the past 48 hour(s))  Resp panel by RT-PCR (RSV, Flu A&B, Covid) Anterior Nasal Swab     Status: None   Collection Time: 05/17/23  8:01 AM   Specimen: Anterior Nasal Swab  Result Value Ref Range   SARS Coronavirus 2 by RT PCR NEGATIVE NEGATIVE   Influenza A by PCR NEGATIVE NEGATIVE   Influenza B by PCR NEGATIVE NEGATIVE    Comment: (NOTE) The Xpert Xpress SARS-CoV-2/FLU/RSV plus assay  is intended as an aid in the diagnosis of influenza from Nasopharyngeal swab specimens and should not be used as a sole basis for treatment. Nasal washings and aspirates are unacceptable for Xpert Xpress SARS-CoV-2/FLU/RSV testing.  Fact Sheet for Patients: BloggerCourse.com  Fact Sheet for Healthcare Providers: SeriousBroker.it  This test is not yet approved or cleared by the [place] FDA and has been authorized for detection and/or diagnosis of SARS-CoV-2 by FDA under an Emergency Use Authorization (EUA). This EUA will remain in effect (meaning this test can be used) for the duration of the COVID-19 declaration under Section 564(b)(1) of the Act, 21 U.S.C. section 360bbb-3(b)(1), unless the authorization is terminated or revoked.     Resp Syncytial Virus by PCR NEGATIVE NEGATIVE    Comment: (NOTE) Fact Sheet for Patients: BloggerCourse.com  Fact Sheet for Healthcare Providers: SeriousBroker.it  This test is not yet approved or cleared by the [place] FDA and has been authorized for detection and/or diagnosis of SARS-CoV-2 by FDA under an Emergency Use Authorization (EUA). This EUA will remain in effect (meaning this test can be used) for the duration of the COVID-19 declaration under Section 564(b)(1) of the Act, 21 U.S.C. section 360bbb-3(b)(1), unless the authorization is terminated or revoked.  Performed at [facility], [address] [place], [place] [address]   Comprehensive metabolic panel     Status: Abnormal   Collection Time: 05/17/23  8:01 AM  Result Value Ref Range   Sodium 142 135 - 145 mmol/L   Potassium 3.6 3.5 - 5.1 mmol/L   Chloride 106 98 - 111 mmol/L   CO2 25 22 - 32 mmol/L   Glucose, Bld 104 (H) 70 - 99 mg/dL    Comment: Glucose reference range applies only to samples taken after fasting for at least 8 hours.   BUN 17 8 - 23 mg/dL    Creatinine, Ser 1.91 (H) 0.61 - 1.24 mg/dL   Calcium 9.0 8.9 - 47.8 mg/dL   Total Protein 6.9 6.5 - 8.1 g/dL   Albumin 3.8 3.5 - 5.0 g/dL   AST 20 15 - 41 U/L   ALT 13 0 - 44 U/L   Alkaline Phosphatase 81 38 - 126 U/L   Total Bilirubin 0.6 0.3 - 1.2 mg/dL   GFR, Estimated 49 (L) >60 mL/min    Comment: (NOTE) Calculated using the CKD-EPI Creatinine Equation (2021)    Anion gap 11 5 - 15    Comment: Performed at [facility], [address], [place], [place] [address]  CBC     Status: Abnormal   Collection Time: 05/17/23  8:01 AM  Result Value Ref Range   WBC 11.3 (H) 4.0 - 10.5 K/uL   RBC 4.89 4.22 - 5.81 MIL/uL   Hemoglobin 15.1 13.0 - 17.0 g/dL   HCT 13.0 86.5 - 78.4 %  MCV 93.9 80.0 - 100.0 fL   MCH 30.9 26.0 - 34.0 pg   MCHC 32.9 30.0 - 36.0 g/dL   RDW 40.9 81.1 - 91.4 %   Platelets 194 150 - 400 K/uL   nRBC 0.0 0.0 - 0.2 %    Comment: Performed at [facility], [address], [place], [place] [address]  Brain natriuretic peptide     Status: Abnormal   Collection Time: 05/17/23  8:01 AM  Result Value Ref Range   B Natriuretic Peptide 507.0 (H) 0.0 - 100.0 pg/mL    Comment: Performed at [facility], [address], [place], [place] [address]  D-dimer, quantitative     Status: Abnormal   Collection Time: 05/17/23  9:54 AM  Result Value Ref Range   D-Dimer, Quant 0.75 (H) 0.00 - 0.50 ug/mL-FEU    Comment: (NOTE) At the manufacturer cut-off value of 0.5 g/mL FEU, this assay has a negative predictive value of 95-100%.This assay is intended for use in conjunction with a clinical pretest probability (PTP) assessment model to exclude pulmonary embolism (PE) and deep venous thrombosis (DVT) in outpatients suspected of PE or DVT. Results should be correlated with clinical presentation. Performed at [facility], [address] [place], [place] [address]   I-Stat arterial blood gas, ED     Status: Abnormal   Collection Time: 05/17/23  2:10 PM   Result Value Ref Range   pH, Arterial 7.385 7.35 - 7.45   pCO2 arterial 40.2 32 - 48 mmHg   pO2, Arterial 62 (L) 83 - 108 mmHg   Bicarbonate 24.2 20.0 - 28.0 mmol/L   TCO2 25 22 - 32 mmol/L   O2 Saturation 92 %   Acid-base deficit 1.0 0.0 - 2.0 mmol/L   Sodium 136 135 - 145 mmol/L   Potassium 4.1 3.5 - 5.1 mmol/L   Calcium, Ion 1.22 1.15 - 1.40 mmol/L   HCT 44.0 39.0 - 52.0 %   Hemoglobin 15.0 13.0 - 17.0 g/dL   Patient temperature 46.9 F    Collection site RADIAL, ALLEN'S TEST ACCEPTABLE    Drawn by RT    Sample type ARTERIAL    DG Chest Portable 1 View  Result Date: 05/17/2023 CLINICAL DATA:  shortness of breath EXAM: PORTABLE CHEST 1 VIEW COMPARISON:  CXR 01/21/22 FINDINGS: No pleural effusion. No pneumothorax. No focal airspace opacity. Unchanged cardiac and mediastinal contours. No radiographically apparent displaced rib fractures. Visualized upper abdomen unremarkable. IMPRESSION: No focal airspace opacity Electronically Signed   By: Lorenza Cambridge M.D.   On: 05/17/2023 08:33    Pending Labs Unresulted Labs (From admission, onward)     Start     Ordered   05/18/23 0500  CBC  Tomorrow morning,   R        05/17/23 1033   05/18/23 0500  Basic metabolic panel  Tomorrow morning,   R        05/17/23 1033   05/18/23 0500  HIV Antibody (routine testing w rflx)  Once,   R        05/18/23 0500   05/17/23 1407  Procalcitonin  Add-on,   AD       References:    Procalcitonin Lower Respiratory Tract Infection AND Sepsis Procalcitonin Algorithm   05/17/23 1406   05/17/23 1351  Urinalysis, Routine w reflex microscopic -Urine, Clean Catch  Once,   R       Question:  Specimen Source  Answer:  Urine,  Clean Catch   05/17/23 1350   05/17/23 1348  Respiratory (~20 pathogens) panel by PCR  (Respiratory panel by PCR (~20 pathogens, ~24 hr TAT)  w precautions)  Add-on,   AD        05/17/23 1347   05/17/23 1335  Blood gas, arterial  Once,   R        05/17/23 1334             Vitals/Pain Today's Vitals   05/17/23 1300 05/17/23 1445 05/17/23 1500 05/17/23 1606  BP: (!) 148/123 (!) 163/103 (!) 142/104   Pulse: 78 78 84   Resp: (!) 23 (!) 33 (!) 31   Temp:    98.5 F (36.9 C)  SpO2: 95% 95% 97%   Weight:      Height:      PainSc:        Isolation Precautions Droplet precaution  Medications Medications  enoxaparin (LOVENOX) injection 40 mg (40 mg Subcutaneous Given 05/17/23 1446)  sodium chloride flush (NS) 0.9 % injection 3 mL (3 mLs Intravenous Given 05/17/23 1116)  acetaminophen (TYLENOL) tablet 650 mg (has no administration in time range)    Or  acetaminophen (TYLENOL) suppository 650 mg (has no administration in time range)  guaiFENesin (MUCINEX) 12 hr tablet 600 mg (600 mg Oral Given 05/17/23 1115)  albuterol (PROVENTIL) (2.5 MG/3ML) 0.083% nebulizer solution 2.5 mg (has no administration in time range)  ipratropium-albuterol (DUONEB) 0.5-2.5 (3) MG/3ML nebulizer solution 3 mL (3 mLs Nebulization Given 05/17/23 1446)  ipratropium-albuterol (DUONEB) 0.5-2.5 (3) MG/3ML nebulizer solution 3 mL (3 mLs Nebulization Given 05/17/23 0754)  methylPREDNISolone sodium succinate (SOLU-MEDROL) 125 mg/2 mL injection 125 mg (125 mg Intravenous Given 05/17/23 0801)    Mobility walks     Focused Assessments Pulmonary Assessment Handoff:  Lung sounds: Bilateral Breath Sounds: Expiratory wheezes L Breath Sounds: Diminished R Breath Sounds: Diminished O2 Device: Nasal Cannula O2 Flow Rate (L/min): 4 L/min    R Recommendations: See Admitting Provider Note  Report given to:   Additional Notes: Confusion at times. Easily redirectable

## 2023-05-17 NOTE — Progress Notes (Signed)
Patient requested for trazodone. Patient BP 157/109. Manuel Pray, MD was made aware.   [name]

## 2023-05-18 ENCOUNTER — Encounter (HOSPITAL_COMMUNITY): Payer: Self-pay | Admitting: Internal Medicine

## 2023-05-18 ENCOUNTER — Inpatient Hospital Stay (HOSPITAL_COMMUNITY): Payer: PPO

## 2023-05-18 DIAGNOSIS — I10 Essential (primary) hypertension: Secondary | ICD-10-CM

## 2023-05-18 DIAGNOSIS — J441 Chronic obstructive pulmonary disease with (acute) exacerbation: Secondary | ICD-10-CM | POA: Diagnosis not present

## 2023-05-18 DIAGNOSIS — J9601 Acute respiratory failure with hypoxia: Secondary | ICD-10-CM | POA: Diagnosis not present

## 2023-05-18 DIAGNOSIS — G9340 Encephalopathy, unspecified: Secondary | ICD-10-CM | POA: Diagnosis not present

## 2023-05-18 DIAGNOSIS — D72829 Elevated white blood cell count, unspecified: Secondary | ICD-10-CM | POA: Diagnosis not present

## 2023-05-18 LAB — BASIC METABOLIC PANEL
Anion gap: 9 (ref 5–15)
BUN: 22 mg/dL (ref 8–23)
CO2: 26 mmol/L (ref 22–32)
Calcium: 9.3 mg/dL (ref 8.9–10.3)
Chloride: 101 mmol/L (ref 98–111)
Creatinine, Ser: 1.8 mg/dL — ABNORMAL HIGH (ref 0.61–1.24)
GFR, Estimated: 41 mL/min — ABNORMAL LOW (ref >60)
Glucose, Bld: 165 mg/dL — ABNORMAL HIGH (ref 70–99)
Potassium: 4 mmol/L (ref 3.5–5.1)
Sodium: 136 mmol/L (ref 135–145)

## 2023-05-18 LAB — RESPIRATORY PANEL BY PCR

## 2023-05-18 LAB — GLUCOSE, CAPILLARY: Glucose-Capillary: 134 mg/dL — ABNORMAL HIGH (ref 70–99)

## 2023-05-18 LAB — CBC
HCT: 44 % (ref 39.0–52.0)
Hemoglobin: 14.7 g/dL (ref 13.0–17.0)
MCH: 30.7 pg (ref 26.0–34.0)
MCHC: 33.4 g/dL (ref 30.0–36.0)
MCV: 91.9 fL (ref 80.0–100.0)
Platelets: 204 10*3/uL (ref 150–400)
RBC: 4.79 MIL/uL (ref 4.22–5.81)
RDW: 14.4 % (ref 11.5–15.5)
WBC: 10.1 10*3/uL (ref 4.0–10.5)
nRBC: 0 % (ref 0.0–0.2)

## 2023-05-18 LAB — MAGNESIUM: Magnesium: 2.4 mg/dL (ref 1.7–2.4)

## 2023-05-18 LAB — HIV ANTIBODY (ROUTINE TESTING W REFLEX): HIV Screen 4th Generation wRfx: NONREACTIVE

## 2023-05-18 MED ORDER — TORSEMIDE 20 MG PO TABS
20.0000 mg | ORAL_TABLET | Freq: Every day | ORAL | Status: DC
  Administered 2023-05-18 – 2023-05-19 (×2): 20 mg via ORAL
  Filled 2023-05-18 (×2): qty 1

## 2023-05-18 MED ORDER — ASPIRIN 81 MG PO TBEC
81.0000 mg | DELAYED_RELEASE_TABLET | Freq: Every day | ORAL | Status: DC
  Administered 2023-05-18 – 2023-05-19 (×2): 81 mg via ORAL
  Filled 2023-05-18 (×2): qty 1

## 2023-05-18 MED ORDER — IPRATROPIUM-ALBUTEROL 0.5-2.5 (3) MG/3ML IN SOLN
3.0000 mL | Freq: Four times a day (QID) | RESPIRATORY_TRACT | Status: DC
  Administered 2023-05-18 – 2023-05-19 (×3): 3 mL via RESPIRATORY_TRACT
  Filled 2023-05-18 (×3): qty 3

## 2023-05-18 MED ORDER — LIDOCAINE 5 % EX PTCH
1.0000 | MEDICATED_PATCH | CUTANEOUS | Status: DC
  Administered 2023-05-18 – 2023-05-19 (×2): 1 via TRANSDERMAL
  Filled 2023-05-18 (×2): qty 1

## 2023-05-18 MED ORDER — ATORVASTATIN CALCIUM 10 MG PO TABS
20.0000 mg | ORAL_TABLET | Freq: Every day | ORAL | Status: DC
  Administered 2023-05-18 – 2023-05-19 (×2): 20 mg via ORAL
  Filled 2023-05-18 (×2): qty 2

## 2023-05-18 MED ORDER — SACUBITRIL-VALSARTAN 97-103 MG PO TABS
1.0000 | ORAL_TABLET | Freq: Every day | ORAL | Status: DC
  Administered 2023-05-18 – 2023-05-19 (×2): 1 via ORAL
  Filled 2023-05-18 (×2): qty 1

## 2023-05-18 MED ORDER — ISOSORBIDE MONONITRATE ER 60 MG PO TB24
90.0000 mg | ORAL_TABLET | Freq: Every day | ORAL | Status: DC
  Administered 2023-05-18 – 2023-05-19 (×2): 90 mg via ORAL
  Filled 2023-05-18 (×3): qty 1

## 2023-05-18 MED ORDER — TAMSULOSIN HCL 0.4 MG PO CAPS
0.4000 mg | ORAL_CAPSULE | Freq: Every day | ORAL | Status: DC
  Administered 2023-05-18 – 2023-05-19 (×2): 0.4 mg via ORAL
  Filled 2023-05-18 (×2): qty 1

## 2023-05-18 MED ORDER — DOXYCYCLINE HYCLATE 100 MG PO TABS
100.0000 mg | ORAL_TABLET | Freq: Two times a day (BID) | ORAL | Status: DC
  Administered 2023-05-18 – 2023-05-19 (×2): 100 mg via ORAL
  Filled 2023-05-18 (×2): qty 1

## 2023-05-18 MED ORDER — DAPAGLIFLOZIN PROPANEDIOL 10 MG PO TABS
10.0000 mg | ORAL_TABLET | Freq: Every day | ORAL | Status: DC
  Administered 2023-05-18 – 2023-05-19 (×2): 10 mg via ORAL
  Filled 2023-05-18 (×2): qty 1

## 2023-05-18 MED ORDER — METOPROLOL TARTRATE 50 MG PO TABS
50.0000 mg | ORAL_TABLET | Freq: Two times a day (BID) | ORAL | Status: DC
  Administered 2023-05-18 – 2023-05-19 (×2): 50 mg via ORAL
  Filled 2023-05-18: qty 2
  Filled 2023-05-18: qty 1

## 2023-05-18 NOTE — Assessment & Plan Note (Addendum)
Received 125 mg IV solumedrol on 05-17-2023 Converted to po prednisone 40 mg on 05-18-2023 CTPA negative for PE on 05-17-2023. Shows lingular atelectasis. Procalcitonin < 0.10 Started on IV rocephin on 05-17-2023. Stopped on 05-18-2023. Only received 1 dose Started on doxycycline 05-17-2023  Continue with duonebs QID 20 pathogen RVP shows Rhinovirus Will DC to home on prednisone 40 mg day, then taper over 8 days, doxycyline 100 mg bid and duonebs qid Home nebulizer machine will be obtained for patient prior to discharge

## 2023-05-18 NOTE — Hospital Course (Signed)
HPI: [name] is a 67 y.o. male with medical history significant of hypertension, hyperlipidemia, diastolic dysfunction (last EF 60 to 65% with grade 1 diastolic dysfunction), sarcoidosis, pulmonary embolism, CVA, CKD stage III, tobacco abuse, and AAA who presents with complaints of shortness of breath.  Patient notes symptoms have been present possibly over the last few weeks, but acutely got worse last night.  He had a productive cough with subjective fever/chills,  wheezing, and orthopnea.  He took a at home COVID test which was negative.  He denies having significant chest pain, nausea, vomiting, diarrhea, or abdominal pain. His wife checked a pulse oximetry and noted to be 87-88% on room air.  Patient normally is not on oxygen at baseline and does not have any inhalers.  His 4 grandchildren live with him and his wife.  One of his grandchildren had been having some breathing difficulties earlier this week for which they thought they could have some virus.  Wife reports that he has been smoking up until about 4 to 5 weeks ago and smoked for approximately 15 to 20 years 1/2 a pack of cigarettes per day on average.  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   In the emergency department patient was noted to be afebrile with tachypnea, blood pressures elevated 178/119, and O2 saturations down to be as low as 84% in triage in room air with remained on 4 L nasal cannula oxygen.  Labs significant for WBC 11.3, BUN 17, creatinine 1.54, BNP 507, and D-dimer 0.75.  Chest x-ray noted no focal opacity.  Influenza, RSV, and COVID-19 screening were negative.  Patient had been given 125 mg of Solu-Medrol breathing treatment.  Patient wife makes note that he seems to be little confused with repeated questions.  Significant Events: Admitted  05/17/2023   Significant Labs: Procalcitonin 05-17-2023 negative at <0.10 Respiratory viral panel POSITIVE for Rhinovirus/Enterovirus  Significant Imaging Studies: CTPA 05-17-2023 negative for PE. New inferior lingular infiltrate or atelectasis.  Antibiotic Therapy: Rocephin 05-17-23. Stopped 05-18-2023 Doxycycline 05-17-2023  Procedures:   Consultants:

## 2023-05-18 NOTE — Progress Notes (Signed)
Pt resting in bed with eyes closed.  Resp even and unlabored.  No voiced or observed needs at this time.

## 2023-05-18 NOTE — Plan of Care (Signed)

## 2023-05-18 NOTE — Assessment & Plan Note (Signed)
Could be caused by hypoxic or acute illness with COPD exacerbation. Appears to be resolved now. By AM of 05-18-2023, Pt is AxOx4

## 2023-05-18 NOTE — Assessment & Plan Note (Signed)
Qtc by EKG on 05-17-2023 of 537 msec Will check Mg and K. Keep Mg >2 and K >4.0. repeat EKG

## 2023-05-18 NOTE — Assessment & Plan Note (Signed)
Restart GDMT for diastolic CHF 05-18-2023 with imdur, entresto, demadex and farxiga. Will restart home HTN meds at discharge.

## 2023-05-18 NOTE — Assessment & Plan Note (Signed)
Stable. Last EF 60% by echo 05-2022. Will update echo on this admission.BNP 507 on admission. Does not appear volume overloaded. CTPA negative for pulmonary edema. Restart GDMT.

## 2023-05-18 NOTE — Assessment & Plan Note (Signed)
Not normally on home O2. Came into ER with RA sats of 84%. Started on supplemental O2 in ER on 05-17-2023. Weaned to RA by 05-18-2023. 95%

## 2023-05-18 NOTE — Assessment & Plan Note (Signed)
Likely due to stress from respiratory failure and COPD exacerbation. Procalcitonin <0.10. pt does not have sepsis.

## 2023-05-18 NOTE — Subjective & Objective (Signed)
Stable. Weaned to RA. Still coughing quite a bit. Still SOB with walking to bathroom. No fevers. Coughing up clear sputum. No fevers.

## 2023-05-18 NOTE — Progress Notes (Signed)
BP 164/101. Gery Pray, MD was made aware.  [name]

## 2023-05-18 NOTE — Assessment & Plan Note (Signed)
Stable. baseline SCr 1.6-1.9

## 2023-05-18 NOTE — Progress Notes (Signed)
PROGRESS NOTE    [name]  [name]:[id number] DOB: 07/30/1956 DOA: 05/17/2023 PCP: Manuel Corn, MD  Subjective: Stable. Weaned to RA. Still coughing quite a bit. Still SOB with walking to bathroom. No fevers. Coughing up clear sputum. No fevers.   Hospital Course: HPI: [name] is a 67 y.o. male with medical history significant of hypertension, hyperlipidemia, diastolic dysfunction (last EF 60 to 65% with grade 1 diastolic dysfunction), sarcoidosis, pulmonary embolism, CVA, CKD stage III, tobacco abuse, and AAA who presents with complaints of shortness of breath.  Patient notes symptoms have been present possibly over the last few weeks, but acutely got worse last night.  He had a productive cough with subjective fever/chills,  wheezing, and orthopnea.  He took a at home COVID test which was negative.  He denies having significant chest pain, nausea, vomiting, diarrhea, or abdominal pain. His wife checked a pulse oximetry and noted to be 87-88% on room air.  Patient normally is not on oxygen at baseline and does not have any inhalers.  His 4 grandchildren live with him and his wife.  One of his grandchildren had been having some breathing difficulties earlier this week for which they thought they could have some virus.  Wife reports that he has been smoking up until about 4 to 5 weeks ago and smoked for approximately 15 to 20 years 1/2 a pack of cigarettes per day on average.  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   In the emergency department patient was noted to be afebrile with tachypnea, blood pressures elevated 178/119, and O2 saturations down to be as low as 84% in triage in room air with remained on 4 L nasal cannula oxygen.  Labs significant for WBC 11.3, BUN 17, creatinine 1.54, BNP 507, and D-dimer 0.75.  Chest  x-ray noted no focal opacity.  Influenza, RSV, and COVID-19 screening were negative.  Patient had been given 125 mg of Solu-Medrol breathing treatment.  Patient wife makes note that he seems to be little confused with repeated questions.  Significant Events: Admitted 05/17/2023   Significant Labs: Procalcitonin 05-17-2023 negative at <0.10  Significant Imaging Studies: CTPA 05-17-2023 negative for PE. New inferior lingular infiltrate or atelectasis.  Antibiotic Therapy: Rocephin 05-17-23. Stopped 05-18-2023 Doxycycline 05-17-2023  Procedures:   Consultants:     Assessment and Plan: * COPD exacerbation (HCC) Received 125 mg IV solumedrol on 05-17-2023 Converted to po prednisone 40 mg on 05-18-2023 CTPA negative for PE on 05-17-2023. Shows lingular atelectasis. Procalcitonin < 0.10 Started on IV rocephin on 05-17-2023. Stopped on 05-18-2023. Only received 1 dose Started on doxycycline 05-17-2023  Continue with duonebs QID 20 pathogen RVP pending  Leukocytosis Likely due to stress from respiratory failure and COPD exacerbation. Procalcitonin <0.10. pt does not have sepsis.  Acute respiratory failure with hypoxia (HCC) Not normally on home O2. Came into ER with RA sats of 84%. Started on supplemental O2 in ER on 05-17-2023. Weaned to RA by 05-18-2023. 95%  Encephalopathy acute Could be caused by hypoxic or acute illness with COPD exacerbation. Appears to be resolved now. By AM of 05-18-2023, Pt is AxOx4  History of pulmonary embolism Not on chronic systemic anticoagulants at baseline. Continue with SQ lovenox.  (HFpEF) heart failure with preserved ejection fraction (HCC) Stable. Last EF 60% by echo 05-2022.  Will update echo on this admission.BNP 507 on admission. Does not appear volume overloaded. CTPA negative for pulmonary edema. Restart GDMT.  Prolonged QT interval Qtc by EKG on 05-17-2023 of 537 msec Will check Mg and K. Keep Mg >2 and K >4.0. repeat EKG    Stage 3b chronic  kidney disease - baseline SCr 1.6-1.9 Stable. baseline SCr 1.6-1.9   Essential hypertension Restart GDMT for diastolic CHF 05-18-2023 with imdur, entresto, demadex and farxiga. Holding cardizem, toprol xl until repeat BMP and repeat EKG due to prolonged QTC.   DVT prophylaxis: enoxaparin (LOVENOX) injection 40 mg Start: 05/17/23 1400  Lovenox   Code Status: Full Code Family Communication: no family at bedside Disposition Plan: return home Reason for continuing need for hospitalization: remains on QID nebs, just weaned off O2 early this AM. Still unable to ambulate without significant SOB.  Objective: Vitals:   05/18/23 0500 05/18/23 0649 05/18/23 0800 05/18/23 0817  BP: (!) 164/101  (!) 155/105   Pulse: 72 63 67   Resp:  18 18   Temp: 98.8 F (37.1 C)  97.8 F (36.6 C)   TempSrc: Oral     SpO2: 91% 92% 95% 95%  Weight:      Height:        Intake/Output Summary (Last 24 hours) at 05/18/2023 0911 Last data filed at 05/18/2023 0156 Gross per 24 hour  Intake 589 ml  Output 3 ml  Net 586 ml   Filed Weights   05/17/23 0714 05/17/23 1724  Weight: 94.8 kg 96.2 kg    Examination:  Physical Exam Vitals and nursing note reviewed.  Constitutional:      General: He is not in acute distress.    Appearance: He is not toxic-appearing or diaphoretic.  HENT:     Head: Normocephalic and atraumatic.     Nose: Nose normal.  Eyes:     General: No scleral icterus. Cardiovascular:     Rate and Rhythm: Normal rate and regular rhythm.     Pulses: Normal pulses.  Pulmonary:     Effort: No tachypnea or respiratory distress.     Breath sounds: Decreased air movement present. Examination of the right-upper field reveals wheezing. Examination of the left-upper field reveals wheezing. Wheezing present.     Comments: No distress Wheezing at end of expiration in upper lobes bilaterally  Abdominal:     General: Bowel sounds are normal. There is no distension.     Palpations: Abdomen is  soft.     Tenderness: There is no abdominal tenderness.  Musculoskeletal:     Right lower leg: No edema.     Left lower leg: No edema.  Skin:    General: Skin is warm and dry.     Capillary Refill: Capillary refill takes less than 2 seconds.  Neurological:     General: No focal deficit present.     Mental Status: He is alert and oriented to person, place, and time.     Data Reviewed: I have personally reviewed following labs and imaging studies  CBC: Recent Labs  Lab 05/17/23 0801 05/17/23 1410  WBC 11.3*  --   HGB 15.1 15.0  HCT 45.9 44.0  MCV 93.9  --   PLT 194  --    Basic Metabolic Panel: Recent Labs  Lab 05/17/23 0801 05/17/23 1410  NA 142 136  K 3.6 4.1  CL 106  --   CO2 25  --   GLUCOSE 104*  --   BUN 17  --  CREATININE 1.54*  --   CALCIUM 9.0  --    GFR: Estimated Creatinine Clearance: 56.7 mL/min (A) (by C-G formula based on SCr of 1.54 mg/dL (H)). Liver Function Tests: Recent Labs  Lab 05/17/23 0801  AST 20  ALT 13  ALKPHOS 81  BILITOT 0.6  PROT 6.9  ALBUMIN 3.8   Sepsis Labs: Recent Labs  Lab 05/17/23 0801  PROCALCITON <0.10    Recent Results (from the past 240 hour(s))  Resp panel by RT-PCR (RSV, Flu A&B, Covid) Anterior Nasal Swab     Status: None   Collection Time: 05/17/23  8:01 AM   Specimen: Anterior Nasal Swab  Result Value Ref Range Status   SARS Coronavirus 2 by RT PCR NEGATIVE NEGATIVE Final   Influenza A by PCR NEGATIVE NEGATIVE Final   Influenza B by PCR NEGATIVE NEGATIVE Final    Comment: (NOTE) The Xpert Xpress SARS-CoV-2/FLU/RSV plus assay is intended as an aid in the diagnosis of influenza from Nasopharyngeal swab specimens and should not be used as a sole basis for treatment. Nasal washings and aspirates are unacceptable for Xpert Xpress SARS-CoV-2/FLU/RSV testing.  Fact Sheet for Patients: BloggerCourse.com  Fact Sheet for Healthcare  Providers: SeriousBroker.it  This test is not yet approved or cleared by the [place] FDA and has been authorized for detection and/or diagnosis of SARS-CoV-2 by FDA under an Emergency Use Authorization (EUA). This EUA will remain in effect (meaning this test can be used) for the duration of the COVID-19 declaration under Section 564(b)(1) of the Act, 21 U.S.C. section 360bbb-3(b)(1), unless the authorization is terminated or revoked.     Resp Syncytial Virus by PCR NEGATIVE NEGATIVE Final    Comment: (NOTE) Fact Sheet for Patients: BloggerCourse.com  Fact Sheet for Healthcare Providers: SeriousBroker.it  This test is not yet approved or cleared by the [place] FDA and has been authorized for detection and/or diagnosis of SARS-CoV-2 by FDA under an Emergency Use Authorization (EUA). This EUA will remain in effect (meaning this test can be used) for the duration of the COVID-19 declaration under Section 564(b)(1) of the Act, 21 U.S.C. section 360bbb-3(b)(1), unless the authorization is terminated or revoked.  Performed at [facility], [address], [place], [place] [address]      Radiology Studies: CT Angio Chest Pulmonary Embolism (PE) W or WO Contrast  Result Date: 05/17/2023 CLINICAL DATA:  Shortness of breath, high prob PE suspected EXAM: CT ANGIOGRAPHY CHEST WITH CONTRAST TECHNIQUE: Multidetector CT imaging of the chest was performed using the standard protocol during bolus administration of intravenous contrast. Multiplanar CT image reconstructions and MIPs were obtained to evaluate the vascular anatomy. RADIATION DOSE REDUCTION: This exam was performed according to the departmental dose-optimization program which includes automated exposure control, adjustment of the mA and/or kV according to patient size and/or use of iterative reconstruction technique. CONTRAST:  65mL OMNIPAQUE  IOHEXOL 350 MG/ML SOLN COMPARISON:  01/15/2022 FINDINGS: Cardiovascular: SVC patent. Heart size normal. No pericardial effusion. Satisfactory opacification of pulmonary arteries noted, and there is no evidence of pulmonary emboli. 3-vessel coronary calcifications. Adequate contrast opacification of the thoracic aorta with no evidence of dissection, aneurysm, or stenosis. There is classic 3-vessel brachiocephalic arch anatomy without proximal stenosis. Minimal aortic plaque. Mediastinum/Nodes: Calcified anterior mediastinal, right paratracheal, subcarinal, and bilateral hilar lymph nodes. 1.1 cm borderline enlarged anterior mediastinal node (Im39,Se5) , stable since previous. Lungs/Pleura: No pleural effusion. No pneumothorax. Resolution of posterolateral left upper lobe opacity. New anteroinferior lingular scarring or subsegmental  atelectasis. Stable linear scarring in the right upper lobe. Upper Abdomen: No acute findings. Musculoskeletal: No chest wall abnormality. No acute or significant osseous findings. Review of the MIP images confirms the above findings. IMPRESSION: 1. Negative for acute PE or thoracic aortic dissection. 2. Resolution of left upper lobe opacity. 3. New inferior lingular infiltrate or atelectasis. 4. Coronary and aortic Atherosclerosis (ICD10-I70.0). Electronically Signed   By: Corlis Leak M.D.   On: 05/17/2023 17:08   DG Chest Portable 1 View  Result Date: 05/17/2023 CLINICAL DATA:  shortness of breath EXAM: PORTABLE CHEST 1 VIEW COMPARISON:  CXR 01/21/22 FINDINGS: No pleural effusion. No pneumothorax. No focal airspace opacity. Unchanged cardiac and mediastinal contours. No radiographically apparent displaced rib fractures. Visualized upper abdomen unremarkable. IMPRESSION: No focal airspace opacity Electronically Signed   By: Lorenza Cambridge M.D.   On: 05/17/2023 08:33    Scheduled Meds:  aspirin EC  81 mg Oral Daily   atorvastatin  20 mg Oral Daily   dapagliflozin propanediol  10 mg  Oral Daily   doxycycline  100 mg Oral BID   enoxaparin (LOVENOX) injection  40 mg Subcutaneous Q24H   guaiFENesin  600 mg Oral BID   hydrALAZINE  100 mg Oral BID   ipratropium-albuterol  3 mL Nebulization QID   isosorbide mononitrate  90 mg Oral Daily   lidocaine  1 patch Transdermal Q24H   predniSONE  40 mg Oral Q breakfast   sacubitril-valsartan  1 tablet Oral Daily   sodium chloride flush  3 mL Intravenous Q12H   tamsulosin  0.4 mg Oral Daily   torsemide  20 mg Oral Daily   Continuous Infusions:   LOS: 1 day   Time spent: 40 minutes  Carollee Herter, DO  [facility]  05/18/2023, 9:11 AM

## 2023-05-18 NOTE — Progress Notes (Addendum)
   Repeat EKG today with K 4.0 and Mg 2.4 shows that Qtc has decreased to 483 msec. Was previously 537.  Ok to restart betablocker. Will restart lopressor at 50 mg bid(was on 200 mg Toprol-xl at home)    Carollee Herter, DO [facility]

## 2023-05-18 NOTE — Assessment & Plan Note (Signed)
Not on chronic systemic anticoagulants at baseline. Continue with SQ lovenox.

## 2023-05-18 NOTE — Progress Notes (Signed)
At approximately 2145 pt found wandering the halls and going into other patients rooms.  Pt directed back to his room and when asked what he was doing he replied, I don't know.  All I know is when they put something in my IV , it hurt."  Writer explained to pt that yes, IV was not working correctly, when IV was flushed, it swelled up so the IV is no longer working correctly and now it has to come out.  Writer explained to pt that a new IV would need to be placed due to being on cardiac monitor.  Pt still confused and he wanted to speak to his wife.  Wife called, she told Clinical research associate that "he gets this way any time he has pain medication."  Pt has not had any pain med.  Vs's retaken.  BP remains high but he recently took metoprolol.  O2 sat 96% on room air.  Afebrile.   Pt reoriented to situation and new IV placed.  Will cont to observe.

## 2023-05-19 ENCOUNTER — Inpatient Hospital Stay (HOSPITAL_COMMUNITY): Payer: PPO

## 2023-05-19 ENCOUNTER — Other Ambulatory Visit (HOSPITAL_COMMUNITY): Payer: Self-pay

## 2023-05-19 DIAGNOSIS — G9340 Encephalopathy, unspecified: Secondary | ICD-10-CM | POA: Diagnosis not present

## 2023-05-19 DIAGNOSIS — J441 Chronic obstructive pulmonary disease with (acute) exacerbation: Secondary | ICD-10-CM | POA: Diagnosis not present

## 2023-05-19 DIAGNOSIS — J9601 Acute respiratory failure with hypoxia: Secondary | ICD-10-CM | POA: Diagnosis not present

## 2023-05-19 DIAGNOSIS — D72829 Elevated white blood cell count, unspecified: Secondary | ICD-10-CM | POA: Diagnosis not present

## 2023-05-19 DIAGNOSIS — B348 Other viral infections of unspecified site: Secondary | ICD-10-CM

## 2023-05-19 LAB — COMPREHENSIVE METABOLIC PANEL
ALT: 13 U/L (ref 0–44)
AST: 20 U/L (ref 15–41)
Albumin: 3.2 g/dL — ABNORMAL LOW (ref 3.5–5.0)
Alkaline Phosphatase: 56 U/L (ref 38–126)
Anion gap: 8 (ref 5–15)
BUN: 23 mg/dL (ref 8–23)
CO2: 26 mmol/L (ref 22–32)
Calcium: 8.8 mg/dL — ABNORMAL LOW (ref 8.9–10.3)
Chloride: 103 mmol/L (ref 98–111)
Creatinine, Ser: 1.9 mg/dL — ABNORMAL HIGH (ref 0.61–1.24)
GFR, Estimated: 38 mL/min — ABNORMAL LOW (ref >60)
Glucose, Bld: 97 mg/dL (ref 70–99)
Potassium: 3 mmol/L — ABNORMAL LOW (ref 3.5–5.1)
Sodium: 137 mmol/L (ref 135–145)
Total Bilirubin: 0.8 mg/dL (ref 0.3–1.2)
Total Protein: 6.2 g/dL — ABNORMAL LOW (ref 6.5–8.1)

## 2023-05-19 LAB — CBC WITH DIFFERENTIAL/PLATELET
Abs Immature Granulocytes: 0.02 10*3/uL (ref 0.00–0.07)
Basophils Absolute: 0 10*3/uL (ref 0.0–0.1)
Basophils Relative: 0 %
Eosinophils Absolute: 0.1 10*3/uL (ref 0.0–0.5)
Eosinophils Relative: 1 %
HCT: 39.4 % (ref 39.0–52.0)
Hemoglobin: 13.2 g/dL (ref 13.0–17.0)
Immature Granulocytes: 0 %
Lymphocytes Relative: 13 %
Lymphs Abs: 1.1 10*3/uL (ref 0.7–4.0)
MCH: 30.4 pg (ref 26.0–34.0)
MCHC: 33.5 g/dL (ref 30.0–36.0)
MCV: 90.8 fL (ref 80.0–100.0)
Monocytes Absolute: 0.9 10*3/uL (ref 0.1–1.0)
Monocytes Relative: 11 %
Neutro Abs: 6 10*3/uL (ref 1.7–7.7)
Neutrophils Relative %: 75 %
Platelets: 190 10*3/uL (ref 150–400)
RBC: 4.34 MIL/uL (ref 4.22–5.81)
RDW: 14.6 % (ref 11.5–15.5)
WBC: 8.1 10*3/uL (ref 4.0–10.5)
nRBC: 0 % (ref 0.0–0.2)

## 2023-05-19 LAB — MAGNESIUM: Magnesium: 2.3 mg/dL (ref 1.7–2.4)

## 2023-05-19 MED ORDER — POTASSIUM CHLORIDE CRYS ER 20 MEQ PO TBCR
40.0000 meq | EXTENDED_RELEASE_TABLET | Freq: Once | ORAL | Status: AC
  Administered 2023-05-19: 40 meq via ORAL
  Filled 2023-05-19: qty 2

## 2023-05-19 MED ORDER — PREDNISONE 10 MG PO TABS
ORAL_TABLET | ORAL | 0 refills | Status: AC
  Filled 2023-05-19: qty 20, 8d supply, fill #0

## 2023-05-19 MED ORDER — DOXYCYCLINE HYCLATE 100 MG PO TABS
100.0000 mg | ORAL_TABLET | Freq: Two times a day (BID) | ORAL | 0 refills | Status: AC
  Filled 2023-05-19: qty 10, 5d supply, fill #0

## 2023-05-19 MED ORDER — IPRATROPIUM-ALBUTEROL 0.5-2.5 (3) MG/3ML IN SOLN
3.0000 mL | Freq: Four times a day (QID) | RESPIRATORY_TRACT | 0 refills | Status: DC
Start: 2023-05-19 — End: 2023-08-12
  Filled 2023-05-19: qty 270, 23d supply, fill #0

## 2023-05-19 NOTE — Progress Notes (Signed)
Attempted Echocardiogram, patient is being discharged. Echocardiogram is not needed per Charge Nurse.

## 2023-05-19 NOTE — Discharge Summary (Signed)
Physician Discharge Summary   [name] MRN: [id number] DOB: 06-30-1956  Admit date:     05/17/2023  Discharge date: 05/19/23  Discharge Physician: Carollee Herter   PCP: Creola Corn, MD   Recommendations at discharge:    F/U with PCP within 2 weeks of discharge for hospital followup  Discharge Diagnoses: Principal Problem:   COPD exacerbation (HCC) Active Problems:   Encephalopathy acute   Acute respiratory failure with hypoxia (HCC)   Leukocytosis   Rhinovirus infection   Essential hypertension   Stage 3b chronic kidney disease - baseline SCr 1.6-1.9   Prolonged QT interval   (HFpEF) heart failure with preserved ejection fraction (HCC)   History of pulmonary embolism  Resolved Problems:   * No resolved hospital problems. *  Hospital Course: HPI: [name] is a 67 y.o. male with medical history significant of hypertension, hyperlipidemia, diastolic dysfunction (last EF 60 to 65% with grade 1 diastolic dysfunction), sarcoidosis, pulmonary embolism, CVA, CKD stage III, tobacco abuse, and AAA who presents with complaints of shortness of breath.  Patient notes symptoms have been present possibly over the last few weeks, but acutely got worse last night.  He had a productive cough with subjective fever/chills,  wheezing, and orthopnea.  He took a at home COVID test which was negative.  He denies having significant chest pain, nausea, vomiting, diarrhea, or abdominal pain. His wife checked a pulse oximetry and noted to be 87-88% on room air.  Patient normally is not on oxygen at baseline and does not have any inhalers.  His 4 grandchildren live with him and his wife.  One of his grandchildren had been having some breathing difficulties earlier this week for which they thought they could have some virus.  Wife reports that he has been smoking up until about 4 to 5 weeks ago and smoked for approximately 15 to 20 years 1/2 a pack of cigarettes per day on average.  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   In the emergency department patient was noted to be afebrile with tachypnea, blood pressures elevated 178/119, and O2 saturations down to be as low as 84% in triage in room air with remained on 4 L nasal cannula oxygen.  Labs significant for WBC 11.3, BUN 17, creatinine 1.54, BNP 507, and D-dimer 0.75.  Chest x-ray noted no focal opacity.  Influenza, RSV, and COVID-19 screening were negative.  Patient had been given 125 mg of Solu-Medrol breathing treatment.  Patient wife makes note that he seems to be little confused with repeated questions.  Significant Events: Admitted 05/17/2023   Significant Labs: Procalcitonin 05-17-2023 negative at <0.10 Respiratory viral panel POSITIVE for Rhinovirus/Enterovirus  Significant Imaging Studies: CTPA 05-17-2023 negative for PE. New inferior lingular infiltrate or atelectasis.  Antibiotic Therapy: Rocephin 05-17-23. Stopped 05-18-2023 Doxycycline 05-17-2023  Procedures:   Consultants:    Assessment and Plan: * COPD exacerbation (HCC) Received 125 mg IV solumedrol on 05-17-2023 Converted to po prednisone 40 mg on 05-18-2023 CTPA negative for PE on 05-17-2023. Shows lingular atelectasis. Procalcitonin < 0.10 Started on IV rocephin on 05-17-2023. Stopped on 05-18-2023. Only received 1 dose Started on doxycycline 05-17-2023  Continue with duonebs QID 20 pathogen RVP shows Rhinovirus Will DC to home on prednisone 40 mg day, then taper over 8 days, doxycyline 100  mg bid and duonebs qid Home nebulizer machine will be obtained for patient prior to discharge  Rhinovirus infection Continue supportive care at home.  Leukocytosis Likely due to stress from respiratory failure and COPD exacerbation. Procalcitonin <0.10. pt does not have sepsis.  Acute  respiratory failure with hypoxia (HCC) Not normally on home O2. Came into ER with RA sats of 84%. Started on supplemental O2 in ER on 05-17-2023. Weaned to RA by 05-18-2023. 95%  Encephalopathy acute Could be caused by hypoxic or acute illness with COPD exacerbation. Appears to be resolved now. By AM of 05-18-2023, Pt is AxOx4  History of pulmonary embolism Not on chronic systemic anticoagulants at baseline. Continue with SQ lovenox.  (HFpEF) heart failure with preserved ejection fraction (HCC) Stable. Last EF 60% by echo 05-2022. Does not appear volume overloaded. CTPA negative for pulmonary edema. Restart GDMT.  Prolonged QT interval Qtc by EKG on 05-17-2023 of 537 msec  Repeat EKG after K 4.0 and Mg 2.4, showed QTC of 483 msec   Stage 3b chronic kidney disease - baseline SCr 1.6-1.9 Stable. baseline SCr 1.6-1.9   Essential hypertension Restart GDMT for diastolic CHF 05-18-2023 with imdur, entresto, demadex and farxiga. Will restart home HTN meds at discharge.   Consultants: none Procedures performed: none  Disposition: Home Diet recommendation:  Discharge Diet Orders (From admission, onward)     Start     Ordered   05/19/23 0000  Diet - low sodium heart healthy        05/19/23 1119           Cardiac diet DISCHARGE MEDICATION: Allergies as of 05/19/2023       Reactions   Codeine Nausea And Vomiting   Hydrochlorothiazide Other (See Comments)   Unknown per Pt.    Hydrocodone-acetaminophen Nausea And Vomiting   Hydromorphone Other (See Comments)   hallucinations         Medication List     TAKE these medications    aspirin EC 81 MG tablet Take 81 mg by mouth daily. Swallow whole.   atorvastatin 20 MG tablet Commonly known as: LIPITOR Take 20 mg by mouth daily.   diltiazem 360 MG 24 hr capsule Commonly known as: CARDIZEM CD Take 1 capsule (360 mg total) by mouth daily.   doxycycline 100 MG tablet Commonly known as: VIBRA-TABS Take 1 tablet (100 mg  total) by mouth 2 (two) times daily for 5 days.   Entresto 97-103 MG Generic drug: sacubitril-valsartan Take 1 tablet by mouth 2 (two) times daily. What changed: when to take this   Farxiga 10 MG Tabs tablet Generic drug: dapagliflozin propanediol Take 1 tablet (10 mg total) by mouth daily.   hydrALAZINE 100 MG tablet Commonly known as: APRESOLINE Take 100 mg by mouth 2 (two) times daily.   ipratropium-albuterol 0.5-2.5 (3) MG/3ML Soln Commonly known as: DUONEB Take 3 mLs by nebulization every 6 (six) hours for 20 days.   isosorbide mononitrate 30 MG 24 hr tablet Commonly known as: IMDUR Take 3 tablets (90 mg total) by mouth daily.   metoprolol 200 MG 24 hr tablet Commonly known as: TOPROL-XL Take 1 tablet (200 mg total) by mouth daily.   potassium chloride SA 20 MEQ tablet Commonly known as: KLOR-CON M Take 1 tablet (20 mEq total) by mouth daily.   predniSONE 10 MG tablet Commonly known as: DELTASONE Take 4 tablets (40 mg total) by mouth daily with breakfast for 2 days, THEN 3 tablets (30 mg total) daily with breakfast for  2 days, THEN 2 tablets (20 mg total) daily with breakfast for 2 days, THEN 1 tablet (10 mg total) daily with breakfast for 2 days. Start taking on: May 20, 2023   tamsulosin 0.4 MG Caps capsule Commonly known as: Flomax Take 1 capsule by mouth daily   torsemide 20 MG tablet Commonly known as: DEMADEX Take 1 tablet (20 mg total) by mouth daily.   traZODone 150 MG tablet Commonly known as: DESYREL Take 1 tablet (150 mg total) by mouth at bedtime as needed for sleep. What changed: when to take this   triamcinolone cream 0.1 % Commonly known as: KENALOG Apply to affected areas twice a day as needed for itching/inflammation.               Durable Medical Equipment  (From admission, onward)           Start     Ordered   05/19/23 1118  For home use only DME Nebulizer machine  Once       Question Answer Comment  Patient needs a  nebulizer to treat with the following condition COPD (chronic obstructive pulmonary disease) (HCC)   Length of Need 12 Months      05/19/23 1117            Discharge Exam: Filed Weights   05/17/23 0714 05/17/23 1724  Weight: 94.8 kg 96.2 kg    Condition at discharge: good  The results of significant diagnostics from this hospitalization (including imaging, microbiology, ancillary and laboratory) are listed below for reference.   Imaging Studies: CT Angio Chest Pulmonary Embolism (PE) W or WO Contrast  Result Date: 05/17/2023 CLINICAL DATA:  Shortness of breath, high prob PE suspected EXAM: CT ANGIOGRAPHY CHEST WITH CONTRAST TECHNIQUE: Multidetector CT imaging of the chest was performed using the standard protocol during bolus administration of intravenous contrast. Multiplanar CT image reconstructions and MIPs were obtained to evaluate the vascular anatomy. RADIATION DOSE REDUCTION: This exam was performed according to the departmental dose-optimization program which includes automated exposure control, adjustment of the mA and/or kV according to patient size and/or use of iterative reconstruction technique. CONTRAST:  65mL OMNIPAQUE IOHEXOL 350 MG/ML SOLN COMPARISON:  01/15/2022 FINDINGS: Cardiovascular: SVC patent. Heart size normal. No pericardial effusion. Satisfactory opacification of pulmonary arteries noted, and there is no evidence of pulmonary emboli. 3-vessel coronary calcifications. Adequate contrast opacification of the thoracic aorta with no evidence of dissection, aneurysm, or stenosis. There is classic 3-vessel brachiocephalic arch anatomy without proximal stenosis. Minimal aortic plaque. Mediastinum/Nodes: Calcified anterior mediastinal, right paratracheal, subcarinal, and bilateral hilar lymph nodes. 1.1 cm borderline enlarged anterior mediastinal node (Im39,Se5) , stable since previous. Lungs/Pleura: No pleural effusion. No pneumothorax. Resolution of posterolateral left  upper lobe opacity. New anteroinferior lingular scarring or subsegmental atelectasis. Stable linear scarring in the right upper lobe. Upper Abdomen: No acute findings. Musculoskeletal: No chest wall abnormality. No acute or significant osseous findings. Review of the MIP images confirms the above findings. IMPRESSION: 1. Negative for acute PE or thoracic aortic dissection. 2. Resolution of left upper lobe opacity. 3. New inferior lingular infiltrate or atelectasis. 4. Coronary and aortic Atherosclerosis (ICD10-I70.0). Electronically Signed   By: Corlis Leak M.D.   On: 05/17/2023 17:08   DG Chest Portable 1 View  Result Date: 05/17/2023 CLINICAL DATA:  shortness of breath EXAM: PORTABLE CHEST 1 VIEW COMPARISON:  CXR 01/21/22 FINDINGS: No pleural effusion. No pneumothorax. No focal airspace opacity. Unchanged cardiac and mediastinal contours. No radiographically apparent displaced rib fractures.  Visualized upper abdomen unremarkable. IMPRESSION: No focal airspace opacity Electronically Signed   By: Lorenza Cambridge M.D.   On: 05/17/2023 08:33   XR Shoulder Right  Result Date: 04/21/2023 Moderate degenerative changes to the glenohumeral joint.  No superior migration of the humeral head.   Microbiology: Results for orders placed or performed during the hospital encounter of 05/17/23  Resp panel by RT-PCR (RSV, Flu A&B, Covid) Anterior Nasal Swab     Status: None   Collection Time: 05/17/23  8:01 AM   Specimen: Anterior Nasal Swab  Result Value Ref Range Status   SARS Coronavirus 2 by RT PCR NEGATIVE NEGATIVE Final   Influenza A by PCR NEGATIVE NEGATIVE Final   Influenza B by PCR NEGATIVE NEGATIVE Final    Comment: (NOTE) The Xpert Xpress SARS-CoV-2/FLU/RSV plus assay is intended as an aid in the diagnosis of influenza from Nasopharyngeal swab specimens and should not be used as a sole basis for treatment. Nasal washings and aspirates are unacceptable for Xpert Xpress  SARS-CoV-2/FLU/RSV testing.  Fact Sheet for Patients: BloggerCourse.com  Fact Sheet for Healthcare Providers: SeriousBroker.it  This test is not yet approved or cleared by the [place] FDA and has been authorized for detection and/or diagnosis of SARS-CoV-2 by FDA under an Emergency Use Authorization (EUA). This EUA will remain in effect (meaning this test can be used) for the duration of the COVID-19 declaration under Section 564(b)(1) of the Act, 21 U.S.C. section 360bbb-3(b)(1), unless the authorization is terminated or revoked.     Resp Syncytial Virus by PCR NEGATIVE NEGATIVE Final    Comment: (NOTE) Fact Sheet for Patients: BloggerCourse.com  Fact Sheet for Healthcare Providers: SeriousBroker.it  This test is not yet approved or cleared by the [place] FDA and has been authorized for detection and/or diagnosis of SARS-CoV-2 by FDA under an Emergency Use Authorization (EUA). This EUA will remain in effect (meaning this test can be used) for the duration of the COVID-19 declaration under Section 564(b)(1) of the Act, 21 U.S.C. section 360bbb-3(b)(1), unless the authorization is terminated or revoked.  Performed at [facility], [address], [place], [place] [address]   Respiratory (~20 pathogens) panel by PCR     Status: Abnormal   Collection Time: 05/17/23  1:48 PM   Specimen: Nasopharyngeal Swab; Respiratory  Result Value Ref Range Status   Adenovirus NOT DETECTED NOT DETECTED Final   Coronavirus 229E NOT DETECTED NOT DETECTED Final    Comment: (NOTE) The Coronavirus on the Respiratory Panel, DOES NOT test for the novel  Coronavirus (2019 nCoV)    Coronavirus HKU1 NOT DETECTED NOT DETECTED Final   Coronavirus NL63 NOT DETECTED NOT DETECTED Final   Coronavirus OC43 NOT DETECTED NOT DETECTED Final   Metapneumovirus NOT DETECTED NOT DETECTED Final    Rhinovirus / Enterovirus DETECTED (A) NOT DETECTED Final   Influenza A NOT DETECTED NOT DETECTED Final   Influenza B NOT DETECTED NOT DETECTED Final   Parainfluenza Virus 1 NOT DETECTED NOT DETECTED Final   Parainfluenza Virus 2 NOT DETECTED NOT DETECTED Final   Parainfluenza Virus 3 NOT DETECTED NOT DETECTED Final   Parainfluenza Virus 4 NOT DETECTED NOT DETECTED Final   Respiratory Syncytial Virus NOT DETECTED NOT DETECTED Final   Bordetella pertussis NOT DETECTED NOT DETECTED Final   Bordetella Parapertussis NOT DETECTED NOT DETECTED Final   Chlamydophila pneumoniae NOT DETECTED NOT DETECTED Final   Mycoplasma pneumoniae NOT DETECTED NOT DETECTED Final    Comment: Performed at [facility], [address] [place], [place] [address]    Labs: CBC: Recent Labs  Lab 05/17/23 0801 05/17/23 1410 05/18/23 0831 05/19/23 0625  WBC 11.3*  --  10.1 8.1  NEUTROABS  --   --   --  6.0  HGB 15.1 15.0 14.7 13.2  HCT 45.9 44.0 44.0 39.4  MCV 93.9  --  91.9 90.8  PLT 194  --  204 190   Basic Metabolic Panel: Recent Labs  Lab 05/17/23 0801 05/17/23 1410 05/18/23 0828 05/19/23 0625  NA 142 136 136 137  K 3.6 4.1 4.0 3.0*  CL 106  --  101 103  CO2 25  --  26 26  GLUCOSE 104*  --  165* 97  BUN 17  --  22 23  CREATININE 1.54*  --  1.80* 1.90*  CALCIUM 9.0  --  9.3 8.8*  MG  --   --  2.4 2.3   Liver Function Tests: Recent Labs  Lab 05/17/23 0801 05/19/23 0625  AST 20 20  ALT 13 13  ALKPHOS 81 56  BILITOT 0.6 0.8  PROT 6.9 6.2*  ALBUMIN 3.8 3.2*   CBG: Recent Labs  Lab 05/18/23 1944  GLUCAP 134*    Discharge time spent: greater than 30 minutes.  Signed: Carollee Herter, DO [facility] 05/19/2023

## 2023-05-19 NOTE — Assessment & Plan Note (Signed)
Continue supportive care at home.

## 2023-05-19 NOTE — Progress Notes (Signed)
Transition of Care [facility]) - Inpatient Brief Assessment   Patient Details  Name: [name] MRN: [id number] Date of Birth: 10-13-1955  Transition of Care [facility] CM/SW Contact:    Janae Bridgeman, RN Phone Number: 05/19/2023, 11:33 AM   Clinical Narrative: CM met with the patient at the bedside to discuss TOC needs to return home today.  Patient admitted to the hospital with Pulmonary edema and plans to discharge home with wife.  Patient is non-smoker.  I spoke with the patient at the bedside and offered Medicare choice regarding DME company and he did not have a preference.  I called [employer] and asked that nebulizer machine be provided and education provided at the bedside.  Nebulizer orders placed to be co-signed by the MD.  Resources for Nebulizer machine provided in the AVS.   Transition of Care Asessment: Insurance and Status: (P) Insurance coverage has been reviewed Patient has primary care physician: (P) Yes Home environment has been reviewed: (P) Home with wife at the home, grandchildren at home as well Prior level of function:: (P) Independent Prior/Current Home Services: (P) No current home services Social Determinants of Health Reivew: (P) SDOH reviewed interventions complete Readmission risk has been reviewed: (P) Yes Transition of care needs: (P) transition of care needs identified, TOC will continue to follow

## 2023-05-19 NOTE — Progress Notes (Signed)
    Durable Medical Equipment  (From admission, onward)           Start     Ordered   05/19/23 1118  For home use only DME Nebulizer machine  Once       Question Answer Comment  Patient needs a nebulizer to treat with the following condition COPD (chronic obstructive pulmonary disease) (HCC)   Length of Need 12 Months      05/19/23 1117

## 2023-05-23 ENCOUNTER — Encounter: Payer: Self-pay | Admitting: Cardiology

## 2023-05-23 ENCOUNTER — Ambulatory Visit: Payer: PPO | Admitting: Cardiology

## 2023-05-23 VITALS — BP 148/82 | HR 62 | Resp 16 | Ht 72.0 in | Wt 217.0 lb

## 2023-05-23 DIAGNOSIS — G4733 Obstructive sleep apnea (adult) (pediatric): Secondary | ICD-10-CM

## 2023-05-23 DIAGNOSIS — E782 Mixed hyperlipidemia: Secondary | ICD-10-CM

## 2023-05-23 DIAGNOSIS — I4729 Other ventricular tachycardia: Secondary | ICD-10-CM | POA: Diagnosis not present

## 2023-05-23 DIAGNOSIS — I5032 Chronic diastolic (congestive) heart failure: Secondary | ICD-10-CM

## 2023-05-23 DIAGNOSIS — D860 Sarcoidosis of lung: Secondary | ICD-10-CM

## 2023-05-23 DIAGNOSIS — I10 Essential (primary) hypertension: Secondary | ICD-10-CM

## 2023-05-23 NOTE — Progress Notes (Addendum)
ID:  [name], DOB [birth year], MRN [id number]  PCP:  Creola Corn, MD  Cardiologist: Tessa Lerner, DO, [facility] (established care 12/10/2020)  Date: 05/23/23 Last Office Visit: 11/17/2022  Chief Complaint  Patient presents with   HFpEF   Follow-up    HPI  [name] is a 67 y.o. 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  Patient presents today for 19-month follow-up visit.  Since last office visit he is doing well overall.  However, recently was hospitalized due to acute COPD exacerbation likely brought on by rhinovirus.  He still recuperating and is now back to baseline.  He denies anginal chest pain and shortness of breath remains stable.  Currently remains afebrile.  FUNCTIONAL STATUS: No structured exercise program or daily routine.   ALLERGIES: Allergies  Allergen Reactions   Codeine Nausea And Vomiting   Hydrochlorothiazide Other (See Comments)    Unknown per Pt.    Hydrocodone-Acetaminophen Nausea And Vomiting   Hydromorphone Other (See Comments)    hallucinations     MEDICATION LIST PRIOR TO VISIT: Current Meds  Medication Sig   aspirin EC 81 MG tablet Take 81 mg by mouth daily. Swallow whole.   atorvastatin (LIPITOR) 20 MG tablet Take 20 mg by mouth daily.   dapagliflozin propanediol (FARXIGA) 10 MG TABS tablet Take 1 tablet (10 mg total) by mouth daily.   diltiazem (CARDIZEM CD) 360 MG 24 hr capsule Take 1 capsule (360 mg total) by mouth daily.   doxycycline (VIBRA-TABS) 100 MG tablet Take 1 tablet (100 mg total) by mouth 2 (two) times daily for 5 days.    ipratropium-albuterol (DUONEB) 0.5-2.5 (3) MG/3ML SOLN Use 3 mLs by nebulization every 6 (six) hours for 20 days.   isosorbide mononitrate (IMDUR) 30 MG 24 hr tablet Take 3 tablets (90 mg total) by mouth daily.   metoprolol (TOPROL-XL) 200 MG 24 hr tablet Take 1 tablet (200 mg total) by mouth daily.   potassium chloride SA (KLOR-CON M) 20 MEQ tablet Take 1 tablet (20 mEq total) by mouth daily.   predniSONE (DELTASONE) 10 MG tablet Take 4 tablets by mouth daily with breakfast for 2 days, THEN 3 tablets daily with breakfast for 2 days, THEN 2 tablets daily with breakfast for 2 days, THEN 1 tablet daily with breakfast for 2 days.   sacubitril-valsartan (ENTRESTO) 97-103 MG Take 1 tablet by mouth 2 (two) times daily. (Patient taking differently: Take 1 tablet by mouth daily.)   torsemide (DEMADEX) 20 MG tablet Take 1 tablet (20 mg total) by mouth daily.   traZODone (DESYREL) 150 MG tablet Take 1 tablet (150 mg total) by mouth at bedtime as needed for sleep. (Patient taking differently: Take 150 mg by mouth at bedtime.)   triamcinolone cream (KENALOG) 0.1 % Apply to affected areas twice a day as needed for itching/inflammation.     PAST MEDICAL HISTORY: Past Medical History:  Diagnosis Date   Back pain    CHF (congestive heart failure) (HCC)    CKD (chronic kidney disease), stage III (HCC)    Closed nondisplaced fracture of first right metatarsal bone 06/23/2022   CVA (cerebral infarction)    Diastolic dysfunction  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   ED (erectile dysfunction)    GERD (gastroesophageal reflux disease)    Hypercalcemia    Hyperlipidemia    Hypertension    Insomnia    Long-term use of high-risk medication    Nephrocalcinosis    Nephrolithiasis    Obesity    Osteopenia    Pancreatitis    admitted 03/19/01-06/05/01   PE (pulmonary embolism) 01/30/2013   Proteinuria    Sarcoidosis    Sleep apnea    Smoker    Stroke [facility])    Patient and wife denies hx of CVA.   PAST SURGICAL HISTORY: Past Surgical History:  Procedure Laterality Date   ABDOMINAL EXPLORATION SURGERY     BACK SURGERY     CHOLECYSTECTOMY     RIGHT/LEFT HEART CATH AND CORONARY ANGIOGRAPHY N/A 05/12/2021   Procedure: RIGHT/LEFT HEART CATH AND CORONARY ANGIOGRAPHY;  Surgeon: Elder Negus, MD;  Location: [facility];  Service: Cardiovascular;  Laterality: N/A;   SHOULDER ARTHROSCOPY Right 10/30/2020   Procedure: ARTHROSCOPY SHOULDER WITH EXTENSIVE DEBRIDEMENT;  Surgeon: Kathryne Hitch, MD;  Location: [facility];  Service: Orthopedics;  Laterality: Right;   SHOULDER SURGERY     TRACHEOSTOMY     closed    FAMILY HISTORY: The patient family history includes Alzheimer's disease in his mother; CVA in his father; Heart failure in his sister; Hypertension in his father and sister; Lung cancer in his father.  SOCIAL HISTORY:  The patient  reports that he quit smoking about 2 years ago. His smoking use included cigarettes. He started smoking about 17 years ago. He has a 3.9 pack-year smoking history. He has never used smokeless tobacco. He reports that he does not drink alcohol and does not use drugs.  REVIEW OF SYSTEMS: Review of Systems  Constitutional: Negative for malaise/fatigue, weight gain and weight loss.  Cardiovascular:  Negative for chest pain, dyspnea on exertion (Chronic and stable), leg swelling, orthopnea, palpitations, paroxysmal nocturnal dyspnea and syncope.  Respiratory:  Positive for cough (Dry, nonproductive) and shortness of breath (Chronic and stable).     PHYSICAL EXAM: Physical Exam Neck:     Vascular: No carotid bruit.  Cardiovascular:     Rate and Rhythm: Normal rate and regular rhythm.     Pulses: Normal pulses.          Carotid pulses are 2+ on the right side and 2+ on the left side.      Radial pulses are 2+ on the right side and 2+ on the left side.       Femoral pulses are 2+ on  the right side and 2+ on the left side.      Popliteal pulses are 2+ on the right side and 2+ on the left side.       Dorsalis pedis pulses are 2+ on the right side and 2+ on the left side.       Posterior tibial pulses are 2+ on the right side and 2+ on the left side.     Heart sounds: S1 normal and S2 normal. No murmur heard. Pulmonary:     Effort: Pulmonary effort is normal.     Breath sounds: Normal breath sounds. No wheezing or rales.     Comments: Diminished lung sounds at bilateral bases Abdominal:     General: Bowel sounds are normal.     Palpations: Abdomen is soft.  Musculoskeletal:  Cervical back: Neck supple.     Right lower leg: No edema.     Left lower leg: No edema.  Neurological:     Mental Status: He is alert.       05/23/2023    9:29 AM 05/19/2023    8:02 AM 05/19/2023    7:40 AM  Vitals with BMI  Height 6\' 0"     Weight 217 lbs    BMI 29.42    Systolic 148 158 952  Diastolic 82 109 110  Pulse 62 71 59   CARDIAC DATABASE: EKG: 05/23/2023: Sinus rhythm, 63 bpm, with sinus arrhythmia, ST-T changes in inferior and lateral leads consider ischemia.  Similar findings on prior tracing 11/17/2022  Echocardiogram: 05/07/2022: 1. Normal LV systolic function with visual EF 55-60%. Left ventricle size is decreased. Severe concentric hypertrophy of the left ventricle. Normal global wall motion. Indeterminate diastolic filling pattern, normal LAP. Calculated EF 59%. 2. Structurally normal trileaflet aortic valve. Trace aortic regurgitation. 3. Structurally normal tricuspid valve with trace regurgitation. No evidence of pulmonary hypertension. 4. No significant change compared to 12/2020.  Stress Testing: No results found for this or any previous visit from the past 1095 days.  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 If he has angina symptoms in future, could consider PCI to distal RCA.  CT chest without contrast: 04/21/2021 1. Widespread patchy nodular thickening of the peribronchovascular interstitium throughout both lungs, most prominent in the upper lobes, with associated patchy peribronchovascular ground-glass opacity. Masslike 3.7 cm focus of consolidation in the anterior right lower lobe, new. 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 Follow-up outpatient high-resolution chest CT suggested in 3-6 months. 2. Mild cardiomegaly. Three-vessel coronary atherosclerosis. 3. Dilated main pulmonary artery, suggesting pulmonary arterial hypertension. 4. Ectatic 4.4 cm ascending thoracic aorta. Recommend annual imaging followup by CTA or MRA. This recommendation follows 2010 ACCF/AHA/AATS/ACR/ASA/SCA/SCAI/SIR/STS/SVM Guidelines for the Diagnosis and Management of Patients with Thoracic Aortic Disease. Circulation. 2010; 121: W413-K440. Aortic aneurysm NOS (ICD10-I71.9). 5. Aortic Atherosclerosis (ICD10-I70.0).  LABORATORY DATA:    Latest Ref Rng & Units 05/19/2023    6:25 AM 05/18/2023    8:31 AM 05/17/2023    2:10 PM  CBC  WBC 4.0 - 10.5 K/uL 8.1  10.1    Hemoglobin 13.0 - 17.0 g/dL 10.2  72.5  36.6   Hematocrit 39.0 - 52.0 % 39.4  44.0  44.0   Platelets 150 - 400 K/uL 190  204         Latest Ref Rng & Units 05/19/2023    6:25 AM 05/18/2023    8:28 AM 05/17/2023    2:10 PM  CMP  Glucose 70 - 99 mg/dL 97  440    BUN 8 - 23 mg/dL 23  22    Creatinine 3.47 - 1.24 mg/dL 4.25  9.56    Sodium 387 - 145 mmol/L 137  136  136   Potassium 3.5 - 5.1  mmol/L 3.0  4.0  4.1   Chloride 98 - 111 mmol/L 103  101    CO2 22 - 32 mmol/L 26  26    Calcium 8.9 - 10.3 mg/dL 8.8  9.3    Total Protein 6.5 - 8.1 g/dL 6.2     Total Bilirubin 0.3 - 1.2 mg/dL 0.8     Alkaline Phos 38 - 126 U/L 56     AST 15 - 41 U/L 20     ALT 0 - 44 U/L 13       Lipid Panel     Component Value Date/Time   CHOL 111 04/09/2021 0147   TRIG 53 04/09/2021 0147   HDL 62 04/09/2021 0147   CHOLHDL 1.8 04/09/2021 0147   VLDL 11 04/09/2021 0147   LDLCALC 38 04/09/2021 0147    No components found for: "NTPROBNP" No results for input(s): "PROBNP" in the last 8760 hours.  No results for input(s): "TSH" in the last 8760 hours.   BMP Recent Labs    05/17/23 0801 05/17/23 1410 05/18/23 0828 05/19/23 0625  NA 142 136 136 137  K 3.6 4.1 4.0 3.0*  CL 106  --  101 103  CO2 25  --  26 26  GLUCOSE 104*  --  165* 97  BUN 17  --  22 23  CREATININE 1.54*  --  1.80* 1.90*  CALCIUM 9.0  --  9.3 8.8*  GFRNONAA 49*  --  41* 38*    HEMOGLOBIN A1C Lab Results  Component Value Date   HGBA1C 5.7 (H) 04/08/2021   MPG 116.89 04/08/2021    External Labs: Collected: 12/08/2020 Creatinine 1.7 mg/dL. (Serum creatinine 1.6 mg/dL collected 78/12/6960) eGFR: 40.9 mL/min per 1.73 m Sodium 143, potassium 3.5, chloride 104, bicarb 32 Hemoglobin 12.6, hematocrit 38.2 NT proBNP 5576  IMPRESSION:    ICD-10-CM   1. Chronic heart failure with preserved ejection fraction (HFpEF) (HCC)  I50.32 EKG 12-Lead    2. Essential hypertension  I10     3. Mixed hyperlipidemia  E78.2     4. NSVT (nonsustained ventricular tachycardia) (HCC)  I47.29     5. Sarcoidosis of lung (HCC)  D86.0     6. OSA (obstructive sleep apnea)  G47.33      There are no discontinued medications.   Current Outpatient Medications:    aspirin EC 81 MG tablet, Take 81 mg by mouth daily. Swallow whole., Disp: , Rfl:    atorvastatin (LIPITOR) 20 MG tablet, Take 20 mg by mouth daily., Disp: , Rfl:     dapagliflozin propanediol (FARXIGA) 10 MG TABS tablet, Take 1 tablet (10 mg total) by mouth daily., Disp: 90 tablet, Rfl: 3   diltiazem (CARDIZEM CD) 360 MG 24 hr capsule, Take 1 capsule (360 mg total) by mouth daily., Disp: 90 capsule, Rfl: 3   doxycycline (VIBRA-TABS) 100 MG tablet, Take 1 tablet (100 mg total) by mouth 2 (two) times daily for 5 days., Disp: 10 tablet, Rfl: 0   ipratropium-albuterol (DUONEB) 0.5-2.5 (3) MG/3ML SOLN, Use 3 mLs by nebulization every 6 (six) hours for 20 days., Disp: 270 mL, Rfl: 0   isosorbide mononitrate (IMDUR) 30 MG 24 hr tablet, Take 3 tablets (90 mg total) by mouth daily., Disp: 270 tablet, Rfl: 3   metoprolol (TOPROL-XL) 200 MG 24 hr tablet, Take 1 tablet (200 mg total) by mouth daily., Disp: 90 tablet, Rfl: 3   potassium chloride SA (  KLOR-CON M) 20 MEQ tablet, Take 1 tablet (20 mEq total) by mouth daily., Disp: 90 tablet, Rfl: 1   predniSONE (DELTASONE) 10 MG tablet, Take 4 tablets by mouth daily with breakfast for 2 days, THEN 3 tablets daily with breakfast for 2 days, THEN 2 tablets daily with breakfast for 2 days, THEN 1 tablet daily with breakfast for 2 days., Disp: 20 tablet, Rfl: 0   sacubitril-valsartan (ENTRESTO) 97-103 MG, Take 1 tablet by mouth 2 (two) times daily. (Patient taking differently: Take 1 tablet by mouth daily.), Disp: 180 tablet, Rfl: 3   torsemide (DEMADEX) 20 MG tablet, Take 1 tablet (20 mg total) by mouth daily., Disp: 30 tablet, Rfl: 2   traZODone (DESYREL) 150 MG tablet, Take 1 tablet (150 mg total) by mouth at bedtime as needed for sleep. (Patient taking differently: Take 150 mg by mouth at bedtime.), Disp: 90 tablet, Rfl: 3   triamcinolone cream (KENALOG) 0.1 %, Apply to affected areas twice a day as needed for itching/inflammation., Disp: 80 g, Rfl: 2   hydrALAZINE (APRESOLINE) 100 MG tablet, Take 100 mg by mouth 2 (two) times daily., Disp: , Rfl:    tamsulosin (FLOMAX) 0.4 MG CAPS capsule, Take 1 capsule by mouth daily, Disp: 30  capsule, Rfl: 6  Orders Placed This Encounter  Procedures   EKG 12-Lead    RECOMMENDATIONS:  [name] is a 67 y.o. male whose past medical history and cardiac risk factors include: Sarcoidosis, HTN, Tobacco abuse, Hx of PE (was on Xarelto for 1 year), OSA not on CPAP, MSSA Bacteremia (March 2022), Hx of cardiac arrest (2002 during his hospitalization for pancreatitis per wife).  Chronic heart failure with preserved ejection fraction (HFpEF) (HCC) Left heart failure hospitalization in May 2023. Stage C, NYHA class II/III. Euvolemic on physical examination. Medications reconciled. Reemphasized the importance of blood pressure management. No changes warranted at this time.  Essential hypertension Medications reconciled. Office blood pressures are currently not at goal. I have asked him to take Entresto 97/103 mg p.o. twice daily. He will call the office if the blood pressure still remains uncontrolled for medication titration.  Mixed hyperlipidemia Currently on atorvastatin. Does not endorse myalgias.  Still recovering from his COPD exacerbation/RSV infection.  Hospitalization records reviewed.  Medications reconciled. Would like to see him back in 6 months or sooner if needed.  Orders Placed This Encounter  Procedures   EKG 12-Lead   --Continue cardiac medications as reconciled in final medication list. --Return in about 6 months (around 11/20/2023) for Follow up HFpEF. Or sooner if needed. --Continue follow-up with your primary care physician regarding the management of your other chronic comorbid conditions.  Patient's questions and concerns were addressed to his satisfaction. He voices understanding of the instructions provided during this encounter.   This note was created using a voice recognition software as a result there may be grammatical errors inadvertently enclosed that do not reflect the nature of this encounter. Every attempt is made to correct such  errors.  Delilah Shan [facility]  Pager:  [phone] Office: [phone]  Addendum: Ambulatory notes received from [facility]. Patient sees Dr. Kathe Mariner Office note June 10, 2023 See media section.  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  FSGS.  External Labs: Collected: 10//2024. BUN 17, creatinine 1.98. Potassium 3.4 Urine protein to creatinine ratio 1382  Meagan Spease Odis Hollingshead, DO, [facility] 12:38 AM 07/03/23

## 2023-05-26 ENCOUNTER — Other Ambulatory Visit (HOSPITAL_COMMUNITY): Payer: Self-pay

## 2023-05-26 DIAGNOSIS — H43811 Vitreous degeneration, right eye: Secondary | ICD-10-CM | POA: Diagnosis not present

## 2023-05-26 DIAGNOSIS — H57812 Brow ptosis, left: Secondary | ICD-10-CM | POA: Diagnosis not present

## 2023-05-27 DIAGNOSIS — R262 Difficulty in walking, not elsewhere classified: Secondary | ICD-10-CM | POA: Diagnosis not present

## 2023-05-27 DIAGNOSIS — R2681 Unsteadiness on feet: Secondary | ICD-10-CM | POA: Diagnosis not present

## 2023-05-27 DIAGNOSIS — S93324S Dislocation of tarsometatarsal joint of right foot, sequela: Secondary | ICD-10-CM | POA: Diagnosis not present

## 2023-05-27 DIAGNOSIS — S92314D Nondisplaced fracture of first metatarsal bone, right foot, subsequent encounter for fracture with routine healing: Secondary | ICD-10-CM | POA: Diagnosis not present

## 2023-05-27 DIAGNOSIS — J45909 Unspecified asthma, uncomplicated: Secondary | ICD-10-CM | POA: Diagnosis not present

## 2023-05-27 DIAGNOSIS — M6281 Muscle weakness (generalized): Secondary | ICD-10-CM | POA: Diagnosis not present

## 2023-05-27 DIAGNOSIS — R41841 Cognitive communication deficit: Secondary | ICD-10-CM | POA: Diagnosis not present

## 2023-05-27 DIAGNOSIS — I5032 Chronic diastolic (congestive) heart failure: Secondary | ICD-10-CM | POA: Diagnosis not present

## 2023-06-07 DIAGNOSIS — D2361 Other benign neoplasm of skin of right upper limb, including shoulder: Secondary | ICD-10-CM | POA: Diagnosis not present

## 2023-06-07 DIAGNOSIS — Z85828 Personal history of other malignant neoplasm of skin: Secondary | ICD-10-CM | POA: Diagnosis not present

## 2023-06-10 DIAGNOSIS — N183 Chronic kidney disease, stage 3 unspecified: Secondary | ICD-10-CM | POA: Diagnosis not present

## 2023-06-10 DIAGNOSIS — I129 Hypertensive chronic kidney disease with stage 1 through stage 4 chronic kidney disease, or unspecified chronic kidney disease: Secondary | ICD-10-CM | POA: Diagnosis not present

## 2023-06-10 DIAGNOSIS — N2581 Secondary hyperparathyroidism of renal origin: Secondary | ICD-10-CM | POA: Diagnosis not present

## 2023-06-10 DIAGNOSIS — D631 Anemia in chronic kidney disease: Secondary | ICD-10-CM | POA: Diagnosis not present

## 2023-06-12 ENCOUNTER — Other Ambulatory Visit: Payer: Self-pay | Admitting: Cardiology

## 2023-06-12 ENCOUNTER — Other Ambulatory Visit: Payer: Self-pay | Admitting: Internal Medicine

## 2023-06-12 DIAGNOSIS — I5031 Acute diastolic (congestive) heart failure: Secondary | ICD-10-CM

## 2023-06-14 ENCOUNTER — Other Ambulatory Visit (HOSPITAL_COMMUNITY): Payer: Self-pay

## 2023-06-14 MED ORDER — TORSEMIDE 20 MG PO TABS
20.0000 mg | ORAL_TABLET | Freq: Every day | ORAL | 2 refills | Status: DC
  Filled 2023-06-14: qty 30, 30d supply, fill #0
  Filled 2023-07-31: qty 30, 30d supply, fill #1

## 2023-06-14 MED ORDER — POTASSIUM CHLORIDE CRYS ER 20 MEQ PO TBCR
20.0000 meq | EXTENDED_RELEASE_TABLET | Freq: Every day | ORAL | 1 refills | Status: DC
  Filled 2023-06-14: qty 90, 90d supply, fill #0

## 2023-06-15 DIAGNOSIS — Z1211 Encounter for screening for malignant neoplasm of colon: Secondary | ICD-10-CM | POA: Diagnosis not present

## 2023-06-15 DIAGNOSIS — Z800 Family history of malignant neoplasm of digestive organs: Secondary | ICD-10-CM | POA: Diagnosis not present

## 2023-06-15 DIAGNOSIS — D124 Benign neoplasm of descending colon: Secondary | ICD-10-CM | POA: Diagnosis not present

## 2023-06-15 DIAGNOSIS — K5730 Diverticulosis of large intestine without perforation or abscess without bleeding: Secondary | ICD-10-CM | POA: Diagnosis not present

## 2023-06-17 DIAGNOSIS — D124 Benign neoplasm of descending colon: Secondary | ICD-10-CM | POA: Diagnosis not present

## 2023-06-18 DIAGNOSIS — J9601 Acute respiratory failure with hypoxia: Secondary | ICD-10-CM | POA: Diagnosis not present

## 2023-06-18 DIAGNOSIS — J449 Chronic obstructive pulmonary disease, unspecified: Secondary | ICD-10-CM | POA: Diagnosis not present

## 2023-06-18 DIAGNOSIS — B348 Other viral infections of unspecified site: Secondary | ICD-10-CM | POA: Diagnosis not present

## 2023-06-18 DIAGNOSIS — I2510 Atherosclerotic heart disease of native coronary artery without angina pectoris: Secondary | ICD-10-CM | POA: Diagnosis not present

## 2023-06-22 ENCOUNTER — Other Ambulatory Visit (HOSPITAL_COMMUNITY): Payer: Self-pay

## 2023-06-22 MED ORDER — COMIRNATY 30 MCG/0.3ML IM SUSY
PREFILLED_SYRINGE | INTRAMUSCULAR | 0 refills | Status: DC
  Filled 2023-06-22: qty 0.3, 7d supply, fill #0

## 2023-06-23 ENCOUNTER — Other Ambulatory Visit: Payer: Self-pay | Admitting: Gastroenterology

## 2023-06-23 DIAGNOSIS — Z860100 Personal history of colon polyps, unspecified: Secondary | ICD-10-CM

## 2023-06-23 DIAGNOSIS — Z538 Procedure and treatment not carried out for other reasons: Secondary | ICD-10-CM

## 2023-06-26 DIAGNOSIS — S92314D Nondisplaced fracture of first metatarsal bone, right foot, subsequent encounter for fracture with routine healing: Secondary | ICD-10-CM | POA: Diagnosis not present

## 2023-06-26 DIAGNOSIS — J45909 Unspecified asthma, uncomplicated: Secondary | ICD-10-CM | POA: Diagnosis not present

## 2023-06-26 DIAGNOSIS — M6281 Muscle weakness (generalized): Secondary | ICD-10-CM | POA: Diagnosis not present

## 2023-06-26 DIAGNOSIS — R262 Difficulty in walking, not elsewhere classified: Secondary | ICD-10-CM | POA: Diagnosis not present

## 2023-06-26 DIAGNOSIS — R2681 Unsteadiness on feet: Secondary | ICD-10-CM | POA: Diagnosis not present

## 2023-06-26 DIAGNOSIS — R41841 Cognitive communication deficit: Secondary | ICD-10-CM | POA: Diagnosis not present

## 2023-06-26 DIAGNOSIS — S93324S Dislocation of tarsometatarsal joint of right foot, sequela: Secondary | ICD-10-CM | POA: Diagnosis not present

## 2023-06-26 DIAGNOSIS — I5032 Chronic diastolic (congestive) heart failure: Secondary | ICD-10-CM | POA: Diagnosis not present

## 2023-06-27 ENCOUNTER — Other Ambulatory Visit (HOSPITAL_COMMUNITY): Payer: Self-pay

## 2023-06-28 ENCOUNTER — Other Ambulatory Visit (HOSPITAL_COMMUNITY): Payer: Self-pay

## 2023-06-28 MED ORDER — TAMSULOSIN HCL 0.4 MG PO CAPS
ORAL_CAPSULE | ORAL | 6 refills | Status: DC
  Filled 2023-06-28: qty 30, 30d supply, fill #0
  Filled 2023-07-31: qty 30, 30d supply, fill #1

## 2023-07-02 ENCOUNTER — Other Ambulatory Visit (HOSPITAL_COMMUNITY): Payer: Self-pay

## 2023-07-04 ENCOUNTER — Telehealth: Payer: Self-pay | Admitting: Cardiology

## 2023-07-04 ENCOUNTER — Other Ambulatory Visit (HOSPITAL_COMMUNITY): Payer: Self-pay

## 2023-07-04 MED ORDER — DAPAGLIFLOZIN PROPANEDIOL 10 MG PO TABS
10.0000 mg | ORAL_TABLET | Freq: Every day | ORAL | 3 refills | Status: DC
  Filled 2023-07-04: qty 30, 30d supply, fill #0
  Filled 2023-07-31: qty 30, 30d supply, fill #1

## 2023-07-04 NOTE — Telephone Encounter (Signed)
Pt c/o medication issue:  1. Name of Medication: Farxiga   2. How are you currently taking this medication (dosage and times per day)? 1 time daily   3. Are you having a reaction (difficulty breathing--STAT)? No   4. What is your medication issue? Pt would like to know if he should continue to take this medication or not. Please advise

## 2023-07-04 NOTE — Telephone Encounter (Signed)
Spoke with the patient who states that his refill for Marcelline Deist was denied so he would like to know if he should keep taking it. It looks like this was supposed to be restarted after his hospitalization in September. I am not sure when it fell off of his list. I will send him in a refill as per Dr. Emelda Brothers last office note he should continue.

## 2023-07-13 ENCOUNTER — Other Ambulatory Visit (HOSPITAL_COMMUNITY): Payer: Self-pay

## 2023-07-14 DIAGNOSIS — H53483 Generalized contraction of visual field, bilateral: Secondary | ICD-10-CM | POA: Diagnosis not present

## 2023-07-14 DIAGNOSIS — H0279 Other degenerative disorders of eyelid and periocular area: Secondary | ICD-10-CM | POA: Diagnosis not present

## 2023-07-14 DIAGNOSIS — Z01818 Encounter for other preprocedural examination: Secondary | ICD-10-CM | POA: Diagnosis not present

## 2023-07-14 DIAGNOSIS — H02422 Myogenic ptosis of left eyelid: Secondary | ICD-10-CM | POA: Diagnosis not present

## 2023-07-14 DIAGNOSIS — H02421 Myogenic ptosis of right eyelid: Secondary | ICD-10-CM | POA: Diagnosis not present

## 2023-07-14 DIAGNOSIS — H57813 Brow ptosis, bilateral: Secondary | ICD-10-CM | POA: Diagnosis not present

## 2023-07-14 DIAGNOSIS — H02423 Myogenic ptosis of bilateral eyelids: Secondary | ICD-10-CM | POA: Diagnosis not present

## 2023-07-14 DIAGNOSIS — H02411 Mechanical ptosis of right eyelid: Secondary | ICD-10-CM | POA: Diagnosis not present

## 2023-07-14 DIAGNOSIS — H02412 Mechanical ptosis of left eyelid: Secondary | ICD-10-CM | POA: Diagnosis not present

## 2023-07-14 DIAGNOSIS — H02413 Mechanical ptosis of bilateral eyelids: Secondary | ICD-10-CM | POA: Diagnosis not present

## 2023-07-14 DIAGNOSIS — H02831 Dermatochalasis of right upper eyelid: Secondary | ICD-10-CM | POA: Diagnosis not present

## 2023-07-14 DIAGNOSIS — H02834 Dermatochalasis of left upper eyelid: Secondary | ICD-10-CM | POA: Diagnosis not present

## 2023-07-19 DIAGNOSIS — J9601 Acute respiratory failure with hypoxia: Secondary | ICD-10-CM | POA: Diagnosis not present

## 2023-07-19 DIAGNOSIS — B348 Other viral infections of unspecified site: Secondary | ICD-10-CM | POA: Diagnosis not present

## 2023-07-19 DIAGNOSIS — I2510 Atherosclerotic heart disease of native coronary artery without angina pectoris: Secondary | ICD-10-CM | POA: Diagnosis not present

## 2023-07-19 DIAGNOSIS — J449 Chronic obstructive pulmonary disease, unspecified: Secondary | ICD-10-CM | POA: Diagnosis not present

## 2023-07-20 ENCOUNTER — Ambulatory Visit
Admission: RE | Admit: 2023-07-20 | Discharge: 2023-07-20 | Disposition: A | Discharge status: Home / Self Care | Payer: PPO | Source: Ambulatory Visit | Attending: Gastroenterology | Admitting: Gastroenterology

## 2023-07-20 DIAGNOSIS — Z538 Procedure and treatment not carried out for other reasons: Secondary | ICD-10-CM

## 2023-07-20 DIAGNOSIS — Z860100 Personal history of colon polyps, unspecified: Secondary | ICD-10-CM

## 2023-07-27 ENCOUNTER — Other Ambulatory Visit (HOSPITAL_COMMUNITY): Payer: Self-pay

## 2023-07-27 DIAGNOSIS — S92314D Nondisplaced fracture of first metatarsal bone, right foot, subsequent encounter for fracture with routine healing: Secondary | ICD-10-CM | POA: Diagnosis not present

## 2023-07-27 DIAGNOSIS — I5032 Chronic diastolic (congestive) heart failure: Secondary | ICD-10-CM | POA: Diagnosis not present

## 2023-07-27 DIAGNOSIS — M6281 Muscle weakness (generalized): Secondary | ICD-10-CM | POA: Diagnosis not present

## 2023-07-27 DIAGNOSIS — R262 Difficulty in walking, not elsewhere classified: Secondary | ICD-10-CM | POA: Diagnosis not present

## 2023-07-27 DIAGNOSIS — J45909 Unspecified asthma, uncomplicated: Secondary | ICD-10-CM | POA: Diagnosis not present

## 2023-07-27 DIAGNOSIS — R41841 Cognitive communication deficit: Secondary | ICD-10-CM | POA: Diagnosis not present

## 2023-07-27 DIAGNOSIS — R2681 Unsteadiness on feet: Secondary | ICD-10-CM | POA: Diagnosis not present

## 2023-07-27 DIAGNOSIS — S93324S Dislocation of tarsometatarsal joint of right foot, sequela: Secondary | ICD-10-CM | POA: Diagnosis not present

## 2023-07-28 DIAGNOSIS — H524 Presbyopia: Secondary | ICD-10-CM | POA: Diagnosis not present

## 2023-07-28 DIAGNOSIS — H43811 Vitreous degeneration, right eye: Secondary | ICD-10-CM | POA: Diagnosis not present

## 2023-07-28 DIAGNOSIS — H02402 Unspecified ptosis of left eyelid: Secondary | ICD-10-CM | POA: Diagnosis not present

## 2023-07-28 DIAGNOSIS — H57812 Brow ptosis, left: Secondary | ICD-10-CM | POA: Diagnosis not present

## 2023-07-28 DIAGNOSIS — H401131 Primary open-angle glaucoma, bilateral, mild stage: Secondary | ICD-10-CM | POA: Diagnosis not present

## 2023-08-01 ENCOUNTER — Other Ambulatory Visit: Payer: Self-pay

## 2023-08-01 ENCOUNTER — Other Ambulatory Visit (HOSPITAL_COMMUNITY): Payer: Self-pay

## 2023-08-05 ENCOUNTER — Encounter (HOSPITAL_COMMUNITY): Payer: Self-pay

## 2023-08-05 ENCOUNTER — Emergency Department (HOSPITAL_COMMUNITY): Payer: PPO

## 2023-08-05 ENCOUNTER — Other Ambulatory Visit: Payer: Self-pay

## 2023-08-05 ENCOUNTER — Emergency Department (HOSPITAL_COMMUNITY)
Admission: EM | Admit: 2023-08-05 | Discharge: 2023-08-05 | Disposition: A | Discharge status: Home / Self Care | Payer: PPO | Attending: Emergency Medicine | Admitting: Emergency Medicine

## 2023-08-05 DIAGNOSIS — R0602 Shortness of breath: Secondary | ICD-10-CM | POA: Diagnosis not present

## 2023-08-05 DIAGNOSIS — I2510 Atherosclerotic heart disease of native coronary artery without angina pectoris: Secondary | ICD-10-CM | POA: Diagnosis not present

## 2023-08-05 DIAGNOSIS — R42 Dizziness and giddiness: Secondary | ICD-10-CM | POA: Insufficient documentation

## 2023-08-05 DIAGNOSIS — Z7982 Long term (current) use of aspirin: Secondary | ICD-10-CM | POA: Insufficient documentation

## 2023-08-05 DIAGNOSIS — R531 Weakness: Secondary | ICD-10-CM | POA: Insufficient documentation

## 2023-08-05 DIAGNOSIS — R079 Chest pain, unspecified: Secondary | ICD-10-CM

## 2023-08-05 DIAGNOSIS — R0789 Other chest pain: Secondary | ICD-10-CM | POA: Diagnosis not present

## 2023-08-05 DIAGNOSIS — R109 Unspecified abdominal pain: Secondary | ICD-10-CM | POA: Insufficient documentation

## 2023-08-05 DIAGNOSIS — N200 Calculus of kidney: Secondary | ICD-10-CM | POA: Diagnosis not present

## 2023-08-05 DIAGNOSIS — R7881 Bacteremia: Secondary | ICD-10-CM | POA: Diagnosis not present

## 2023-08-05 DIAGNOSIS — Z20822 Contact with and (suspected) exposure to covid-19: Secondary | ICD-10-CM | POA: Insufficient documentation

## 2023-08-05 DIAGNOSIS — J180 Bronchopneumonia, unspecified organism: Secondary | ICD-10-CM | POA: Diagnosis not present

## 2023-08-05 DIAGNOSIS — R0902 Hypoxemia: Secondary | ICD-10-CM | POA: Diagnosis not present

## 2023-08-05 DIAGNOSIS — R519 Headache, unspecified: Secondary | ICD-10-CM | POA: Insufficient documentation

## 2023-08-05 DIAGNOSIS — E876 Hypokalemia: Secondary | ICD-10-CM | POA: Insufficient documentation

## 2023-08-05 DIAGNOSIS — J984 Other disorders of lung: Secondary | ICD-10-CM

## 2023-08-05 DIAGNOSIS — I330 Acute and subacute infective endocarditis: Secondary | ICD-10-CM | POA: Diagnosis not present

## 2023-08-05 DIAGNOSIS — J9811 Atelectasis: Secondary | ICD-10-CM | POA: Diagnosis not present

## 2023-08-05 DIAGNOSIS — R071 Chest pain on breathing: Secondary | ICD-10-CM | POA: Diagnosis not present

## 2023-08-05 DIAGNOSIS — J189 Pneumonia, unspecified organism: Secondary | ICD-10-CM | POA: Insufficient documentation

## 2023-08-05 DIAGNOSIS — R918 Other nonspecific abnormal finding of lung field: Secondary | ICD-10-CM | POA: Diagnosis not present

## 2023-08-05 DIAGNOSIS — I10 Essential (primary) hypertension: Secondary | ICD-10-CM | POA: Diagnosis not present

## 2023-08-05 LAB — PROTIME-INR
INR: 1.2 (ref 0.8–1.2)
Prothrombin Time: 15.2 s (ref 11.4–15.2)

## 2023-08-05 LAB — CBC WITH DIFFERENTIAL/PLATELET
Abs Immature Granulocytes: 0.08 10*3/uL — ABNORMAL HIGH (ref 0.00–0.07)
Basophils Absolute: 0.1 10*3/uL (ref 0.0–0.1)
Basophils Relative: 0 %
Eosinophils Absolute: 0.4 10*3/uL (ref 0.0–0.5)
Eosinophils Relative: 3 %
HCT: 36.6 % — ABNORMAL LOW (ref 39.0–52.0)
Hemoglobin: 12.3 g/dL — ABNORMAL LOW (ref 13.0–17.0)
Immature Granulocytes: 1 %
Lymphocytes Relative: 6 %
Lymphs Abs: 0.7 10*3/uL (ref 0.7–4.0)
MCH: 32.1 pg (ref 26.0–34.0)
MCHC: 33.6 g/dL (ref 30.0–36.0)
MCV: 95.6 fL (ref 80.0–100.0)
Monocytes Absolute: 0.8 10*3/uL (ref 0.1–1.0)
Monocytes Relative: 7 %
Neutro Abs: 9.4 10*3/uL — ABNORMAL HIGH (ref 1.7–7.7)
Neutrophils Relative %: 83 %
Platelets: 156 10*3/uL (ref 150–400)
RBC: 3.83 MIL/uL — ABNORMAL LOW (ref 4.22–5.81)
RDW: 14.7 % (ref 11.5–15.5)
WBC: 11.4 10*3/uL — ABNORMAL HIGH (ref 4.0–10.5)
nRBC: 0 % (ref 0.0–0.2)

## 2023-08-05 LAB — COMPREHENSIVE METABOLIC PANEL
ALT: 10 U/L (ref 0–44)
AST: 14 U/L — ABNORMAL LOW (ref 15–41)
Albumin: 2.7 g/dL — ABNORMAL LOW (ref 3.5–5.0)
Alkaline Phosphatase: 59 U/L (ref 38–126)
Anion gap: 9 (ref 5–15)
BUN: 10 mg/dL (ref 8–23)
CO2: 17 mmol/L — ABNORMAL LOW (ref 22–32)
Calcium: 6.9 mg/dL — ABNORMAL LOW (ref 8.9–10.3)
Chloride: 117 mmol/L — ABNORMAL HIGH (ref 98–111)
Creatinine, Ser: 1.33 mg/dL — ABNORMAL HIGH (ref 0.61–1.24)
GFR, Estimated: 59 mL/min — ABNORMAL LOW (ref >60)
Glucose, Bld: 92 mg/dL (ref 70–99)
Potassium: 2.7 mmol/L — CL (ref 3.5–5.1)
Sodium: 143 mmol/L (ref 135–145)
Total Bilirubin: 0.7 mg/dL (ref <1.2)
Total Protein: 5 g/dL — ABNORMAL LOW (ref 6.5–8.1)

## 2023-08-05 LAB — I-STAT VENOUS BLOOD GAS, ED
Acid-base deficit: 5 mmol/L — ABNORMAL HIGH (ref 0.0–2.0)
Bicarbonate: 18.3 mmol/L — ABNORMAL LOW (ref 20.0–28.0)
Calcium, Ion: 0.95 mmol/L — ABNORMAL LOW (ref 1.15–1.40)
HCT: 32 % — ABNORMAL LOW (ref 39.0–52.0)
Hemoglobin: 10.9 g/dL — ABNORMAL LOW (ref 13.0–17.0)
O2 Saturation: 79 %
Potassium: 2.7 mmol/L — CL (ref 3.5–5.1)
Sodium: 145 mmol/L (ref 135–145)
TCO2: 19 mmol/L — ABNORMAL LOW (ref 22–32)
pCO2, Ven: 28.7 mm[Hg] — ABNORMAL LOW (ref 44–60)
pH, Ven: 7.412 (ref 7.25–7.43)
pO2, Ven: 42 mm[Hg] (ref 32–45)

## 2023-08-05 LAB — I-STAT CHEM 8, ED
BUN: 11 mg/dL (ref 8–23)
Calcium, Ion: 1.2 mmol/L (ref 1.15–1.40)
Chloride: 106 mmol/L (ref 98–111)
Creatinine, Ser: 1.5 mg/dL — ABNORMAL HIGH (ref 0.61–1.24)
Glucose, Bld: 108 mg/dL — ABNORMAL HIGH (ref 70–99)
HCT: 40 % (ref 39.0–52.0)
Hemoglobin: 13.6 g/dL (ref 13.0–17.0)
Potassium: 3.3 mmol/L — ABNORMAL LOW (ref 3.5–5.1)
Sodium: 144 mmol/L (ref 135–145)
TCO2: 23 mmol/L (ref 22–32)

## 2023-08-05 LAB — I-STAT CG4 LACTIC ACID, ED
Lactic Acid, Venous: 0.7 mmol/L (ref 0.5–1.9)
Lactic Acid, Venous: <0.3 mmol/L — ABNORMAL LOW (ref 0.5–1.9)

## 2023-08-05 LAB — TROPONIN I (HIGH SENSITIVITY)
Troponin I (High Sensitivity): 8 ng/L (ref <18)
Troponin I (High Sensitivity): 10 ng/L (ref <18)

## 2023-08-05 LAB — BRAIN NATRIURETIC PEPTIDE: B Natriuretic Peptide: 1648.2 pg/mL — ABNORMAL HIGH (ref 0.0–100.0)

## 2023-08-05 LAB — SARS CORONAVIRUS 2 BY RT PCR: SARS Coronavirus 2 by RT PCR: NEGATIVE

## 2023-08-05 LAB — APTT: aPTT: 31 s (ref 24–36)

## 2023-08-05 LAB — ETHANOL: Alcohol, Ethyl (B): <10 mg/dL (ref <10)

## 2023-08-05 LAB — MAGNESIUM: Magnesium: 1.6 mg/dL — ABNORMAL LOW (ref 1.7–2.4)

## 2023-08-05 MED ORDER — IOHEXOL 350 MG/ML SOLN
75.0000 mL | Freq: Once | INTRAVENOUS | Status: AC | PRN
  Administered 2023-08-05: 75 mL via INTRAVENOUS

## 2023-08-05 MED ORDER — ALBUTEROL SULFATE HFA 108 (90 BASE) MCG/ACT IN AERS: 2.0000 | INHALATION_SPRAY | RESPIRATORY_TRACT | Status: DC | PRN

## 2023-08-05 MED ORDER — POTASSIUM CHLORIDE CRYS ER 20 MEQ PO TBCR
40.0000 meq | EXTENDED_RELEASE_TABLET | Freq: Once | ORAL | Status: AC
  Administered 2023-08-05: 40 meq via ORAL
  Filled 2023-08-05: qty 2

## 2023-08-05 MED ORDER — DOXYCYCLINE HYCLATE 100 MG PO CAPS: 100.0000 mg | ORAL_CAPSULE | Freq: Two times a day (BID) | ORAL | 0 refills | Status: DC

## 2023-08-05 MED ORDER — MAGNESIUM SULFATE 2 GM/50ML IV SOLN
2.0000 g | Freq: Once | INTRAVENOUS | Status: AC
Start: 2023-08-05 — End: 2023-08-05
  Administered 2023-08-05: 2 g via INTRAVENOUS
  Filled 2023-08-05: qty 50

## 2023-08-05 NOTE — ED Provider Notes (Signed)
Known ascending thoracic aneuysm. CT no acute findings/?pneumonitis. Recheck after Magnesium and potassium. Try ambulation, if clinically improved, probable D/C. Physical Exam  BP 139/87   Pulse 71   Temp 99.4 F (37.4 C)   Resp 20   Ht 6' (1.829 m)   Wt 74.8 kg   SpO2 96%   BMI 22.38 kg/m   Physical Exam  Procedures  Procedures  ED Course / MDM    Medical Decision Making Amount and/or Complexity of Data Reviewed Labs: ordered. Radiology: ordered.  Risk Prescription drug management.   Patient reassessed at 15: 45 he reports he feels better.  He is not having any dizziness or shortness of breath.  He stood and we ambulated in the hall.  No acute distress.  At this time stable for discharge.       Arby Barrette, MD 08/05/23 1755

## 2023-08-05 NOTE — ED Notes (Signed)
Patient sitting up in bed with no apparent distress at this time, asked to eat. Educated about waiting for the results of the CT scans.

## 2023-08-05 NOTE — Discharge Instructions (Signed)
You were seen in the emergency department for chest pain shortness of breath dizziness.  You had CAT scans of your head chest and abdomen along with blood work.  Your potassium and magnesium are low.  Your heart failure number was elevated.  Your x-ray showed possible pneumonia.  We are starting you on an antibiotic.  You were given some potassium and magnesium.  Please continue your other medications including your fluid pills.  Contact your primary care doctor on Monday for close follow-up.  Return to the emergency department if any worsening or concerning symptoms

## 2023-08-05 NOTE — ED Triage Notes (Signed)
PT BIB EMS from home for chest pain and shortness of breath, started yesterday at approximately 1600, pain and shortness of breath persisted today, not relieved by nebulizer, took Aspirin 324 mg relieved chest pain today, 2L [place] relieved his shortness of breath.    90% on RA  95% 2L SR HR 80 170/100 Capno 30 Resp 36 18 gauge L AC CBG 143

## 2023-08-05 NOTE — ED Provider Notes (Signed)
[place] EMERGENCY DEPARTMENT AT [facility] Provider Note   CSN: [id number] Arrival date & time: 08/05/23  1319     History  Chief Complaint  Patient presents with   Chest Pain    Shortness of breath    [name] is a 67 y.o. male.  He is brought in by EMS for multiple complaints.  He said he has had on and off dizziness since last night.  Today had a severe headache and felt weak on the right side of his body.  Then began experiencing some chest pain.  Wife checked his blood pressure and found it to be high.  He is also endorsing some chest congestion and feeling feverish.  No prior history of stroke although he said it is in his medical history, no history of coronary disease that he is aware of.  EMS gave him aspirin and put him on oxygen which relieved his chest pain.  He currently denies any complaints.  The history is provided by the patient and the spouse.  Chest Pain Pain location:  Substernal area Pain quality: aching   Pain severity:  Moderate Progression:  Resolved Chronicity:  New Associated symptoms: back pain, dizziness, headache, shortness of breath and weakness   Associated symptoms: no abdominal pain, no diaphoresis, no nausea and no vomiting        Home Medications Prior to Admission medications   Medication Sig Start Date End Date Taking? Authorizing Provider  aspirin EC 81 MG tablet Take 81 mg by mouth daily. Swallow whole.    [provider]  atorvastatin (LIPITOR) 20 MG tablet Take 20 mg by mouth daily.    [provider]  COVID-19 mRNA vaccine, Pfizer, (COMIRNATY) syringe Inject into the muscle in pharmacy 06/22/23   Judyann Munson, MD  dapagliflozin propanediol (FARXIGA) 10 MG TABS tablet Take 1 tablet (10 mg total) by mouth daily before breakfast. 07/04/23   Tolia, Sunit, DO  diltiazem (CARDIZEM CD) 360 MG 24 hr capsule Take 1 capsule (360 mg total) by mouth daily. 11/17/22   Custovic, Rozell Searing, DO  hydrALAZINE  (APRESOLINE) 100 MG tablet Take 100 mg by mouth 2 (two) times daily.    [provider]  ipratropium-albuterol (DUONEB) 0.5-2.5 (3) MG/3ML SOLN Use 3 mLs by nebulization every 6 (six) hours for 20 days. 05/19/23 06/11/23  Carollee Herter, DO  isosorbide mononitrate (IMDUR) 30 MG 24 hr tablet Take 3 tablets (90 mg total) by mouth daily. 09/22/22     metoprolol (TOPROL-XL) 200 MG 24 hr tablet Take 1 tablet (200 mg total) by mouth daily. 06/20/22     potassium chloride SA (KLOR-CON M) 20 MEQ tablet Take 1 tablet (20 mEq total) by mouth daily. 06/14/23   Tolia, Sunit, DO  sacubitril-valsartan (ENTRESTO) 97-103 MG Take 1 tablet by mouth 2 (two) times daily. Patient taking differently: Take 1 tablet by mouth daily. 10/04/22   Custovic, Rozell Searing, DO  tamsulosin (FLOMAX) 0.4 MG CAPS capsule Take 1 capsule by mouth daily 06/28/23     torsemide (DEMADEX) 20 MG tablet Take 1 tablet (20 mg total) by mouth daily. 06/14/23   Tolia, Sunit, DO  traZODone (DESYREL) 150 MG tablet Take 1 tablet (150 mg total) by mouth at bedtime as needed for sleep. Patient taking differently: Take 150 mg by mouth at bedtime. 12/22/20   Dohmeier, Porfirio Mylar, MD  triamcinolone cream (KENALOG) 0.1 % Apply to affected areas twice a day as needed for itching/inflammation. 09/09/21  Allergies    Codeine, Hydrochlorothiazide, Hydrocodone-acetaminophen, and Hydromorphone    Review of Systems   Review of Systems  Constitutional:  Negative for diaphoresis.  Eyes:  Negative for visual disturbance.  Respiratory:  Positive for shortness of breath.   Cardiovascular:  Positive for chest pain.  Gastrointestinal:  Negative for abdominal pain, nausea and vomiting.  Musculoskeletal:  Positive for back pain.  Neurological:  Positive for dizziness, weakness and headaches.    Physical Exam Updated Vital Signs BP (!) 160/83   Pulse 78   Temp 99.4 F (37.4 C)   Resp (!) 32   Ht 6' (1.829 m)   Wt 74.8 kg   SpO2 97%   BMI 22.38 kg/m   Physical Exam Vitals and nursing note reviewed.  Constitutional:      General: He is not in acute distress.    Appearance: Normal appearance. He is well-developed.  HENT:     Head: Normocephalic and atraumatic.  Eyes:     Conjunctiva/sclera: Conjunctivae normal.  Cardiovascular:     Rate and Rhythm: Normal rate and regular rhythm.     Heart sounds: Normal heart sounds. No murmur heard. Pulmonary:     Effort: Pulmonary effort is normal. No respiratory distress.     Breath sounds: Normal breath sounds.  Abdominal:     Palpations: Abdomen is soft.     Tenderness: There is no abdominal tenderness. There is no guarding or rebound.  Musculoskeletal:        General: No swelling. Normal range of motion.     Cervical back: Neck supple.     Right lower leg: No tenderness. No edema.     Left lower leg: No tenderness. No edema.  Skin:    General: Skin is warm and dry.     Capillary Refill: Capillary refill takes less than 2 seconds.  Neurological:     General: No focal deficit present.     Mental Status: He is alert and oriented to person, place, and time.     Cranial Nerves: No cranial nerve deficit.     Sensory: No sensory deficit.     Motor: No weakness.  Psychiatric:        Mood and Affect: Mood normal.     ED Results / Procedures / Treatments   Labs (all labs ordered are listed, but only abnormal results are displayed) Labs Reviewed  CBC WITH DIFFERENTIAL/PLATELET - Abnormal; Notable for the following components:      Result Value   WBC 11.4 (*)    RBC 3.83 (*)    Hemoglobin 12.3 (*)    HCT 36.6 (*)    Neutro Abs 9.4 (*)    Abs Immature Granulocytes 0.08 (*)    All other components within normal limits  COMPREHENSIVE METABOLIC PANEL - Abnormal; Notable for the following components:   Potassium 2.7 (*)    Chloride 117 (*)    CO2 17 (*)    Creatinine, Ser 1.33 (*)    Calcium 6.9 (*)    Total Protein 5.0 (*)    Albumin 2.7 (*)    AST 14 (*)    GFR, Estimated 59 (*)     All other components within normal limits  BRAIN NATRIURETIC PEPTIDE - Abnormal; Notable for the following components:   B Natriuretic Peptide 1,648.2 (*)    All other components within normal limits  MAGNESIUM - Abnormal; Notable for the following components:   Magnesium 1.6 (*)    All other components within normal  limits  I-STAT CHEM 8, ED - Abnormal; Notable for the following components:   Potassium 3.3 (*)    Creatinine, Ser 1.50 (*)    Glucose, Bld 108 (*)    All other components within normal limits  I-STAT VENOUS BLOOD GAS, ED - Abnormal; Notable for the following components:   pCO2, Ven 28.7 (*)    Bicarbonate 18.3 (*)    TCO2 19 (*)    Acid-base deficit 5.0 (*)    Potassium 2.7 (*)    Calcium, Ion 0.95 (*)    HCT 32.0 (*)    Hemoglobin 10.9 (*)    All other components within normal limits  I-STAT CG4 LACTIC ACID, ED - Abnormal; Notable for the following components:   Lactic Acid, Venous <0.3 (*)    All other components within normal limits  SARS CORONAVIRUS 2 BY RT PCR  CULTURE, BLOOD (ROUTINE X 2)  CULTURE, BLOOD (ROUTINE X 2)  PROTIME-INR  APTT  ETHANOL  I-STAT CG4 LACTIC ACID, ED  TROPONIN I (HIGH SENSITIVITY)  TROPONIN I (HIGH SENSITIVITY)    EKG EKG Interpretation Date/Time:  Friday August 05 2023 13:46:46 EST Ventricular Rate:  78 PR Interval:  200 QRS Duration:  98 QT Interval:  498 QTC Calculation: 568 R Axis:   -35  Text Interpretation: Sinus rhythm Left axis deviation Borderline T abnormalities, inferior leads Prolonged QT interval No significant change since prior 9/24 Confirmed by Meridee Score [phone]) on 08/05/2023 1:58:48 PM  Radiology CT Angio Chest/Abd/Pel for Dissection W and/or W/WO  Result Date: 08/05/2023 CLINICAL DATA:  Chest pain shortness of breath. Concern for acute aortic syndrome. EXAM: CT ANGIOGRAPHY CHEST, ABDOMEN AND PELVIS TECHNIQUE: Non-contrast CT of the chest was initially obtained. Multidetector CT imaging through  the chest, abdomen and pelvis was performed using the standard protocol during bolus administration of intravenous contrast. Multiplanar reconstructed images and MIPs were obtained and reviewed to evaluate the vascular anatomy. RADIATION DOSE REDUCTION: This exam was performed according to the departmental dose-optimization program which includes automated exposure control, adjustment of the mA and/or kV according to patient size and/or use of iterative reconstruction technique. CONTRAST:  75mL OMNIPAQUE IOHEXOL 350 MG/ML SOLN COMPARISON:  CT abdomen pelvis, 07/20/2023.  CTA chest, 05/17/2023. FINDINGS: CTA CHEST FINDINGS Cardiovascular: Ascending thoracic aorta measures 4 cm in diameter. No dissection. Minor atherosclerosis. No atherosclerotic ulcer. Aortic arch branch vessels are widely patent. Heart is normal in size. Three-vessel coronary artery calcifications. No pericardial effusion. Mediastinum/Nodes: No neck base, mediastinal or hilar masses. No enlarged lymph nodes. Multiple old calcified mediastinal and bilateral hilar nodes are noted, stable. Trachea esophagus are unremarkable. Lungs/Pleura: Patchy small confluent and ground-glass opacities no in the right middle and lower lobe, to a lesser degree in the left upper lobe lingula. Additional linear opacities noted in the left lower lobe at the lung base consistent with atelectasis. Linear opacity noted in the right lateral upper lobe, consistent with scarring or atelectasis. Compared to the prior CT angiogram of the chest from 05/17/2023, left lower lobe atelectasis is improved. Other patchy areas of opacity are new. No pleural effusion or pneumothorax. Musculoskeletal: No fracture or acute finding. No bone lesion. No chest wall mass. Review of the MIP images confirms the above findings. CTA ABDOMEN AND PELVIS FINDINGS VASCULAR Aorta: Minor atherosclerosis.  No aneurysm or dissection. Celiac: Patent without evidence of aneurysm, dissection, vasculitis or  significant stenosis. SMA: Patent without evidence of aneurysm, dissection, vasculitis or significant stenosis. Renals: Both renal arteries are patent without evidence  of aneurysm, dissection, vasculitis, fibromuscular dysplasia or significant stenosis. IMA: Patent without evidence of aneurysm, dissection, vasculitis or significant stenosis. Inflow: Patent without evidence of aneurysm, dissection, vasculitis or significant stenosis. Veins: No obvious venous abnormality within the limitations of this arterial phase study. Review of the MIP images confirms the above findings. NON-VASCULAR Hepatobiliary: No focal liver abnormality is seen. Status post cholecystectomy. No biliary dilatation. Pancreas: Unremarkable. No pancreatic ductal dilatation or surrounding inflammatory changes. Spleen: Normal in size without focal abnormality. Adrenals/Urinary Tract: No adrenal masses. Bilateral renal cortical thinning. Stable nonobstructing stone, lower pole the left kidney. No renal masses. No other intrarenal stones. No hydronephrosis. Normal ureters. Normal bladder. Stomach/Bowel: Stomach is within normal limits. Appendix appears normal. No evidence of bowel wall thickening, distention, or inflammatory changes. Lymphatic: No enlarged lymph nodes. Reproductive: Unremarkable. Other: No abdominal wall hernia or abnormality. No abdominopelvic ascites. Musculoskeletal: No fracture or acute finding. No bone lesion. Stable changes from a prior fusion, L3 through L5. Review of the MIP images confirms the above findings. IMPRESSION: 1. No evidence of acute aortic syndrome. No aortic aneurysm or dissection. 2. Patchy small confluent and ground-glass opacities in the right middle and lower lobe, to a lesser degree in the left upper lobe lingula. These are new since the prior CT angiogram of the chest from 05/17/2023 and are consistent with an infectious or inflammatory process. 3. Three-vessel coronary artery calcifications. 4. No acute  findings within the abdomen or pelvis. Electronically Signed   By: Amie Portland M.D.   On: 08/05/2023 16:20   CT HEAD WO CONTRAST  Result Date: 08/05/2023 CLINICAL DATA:  Headache, new onset (Age >= 51y) EXAM: CT HEAD WITHOUT CONTRAST TECHNIQUE: Contiguous axial images were obtained from the base of the skull through the vertex without intravenous contrast. RADIATION DOSE REDUCTION: This exam was performed according to the departmental dose-optimization program which includes automated exposure control, adjustment of the mA and/or kV according to patient size and/or use of iterative reconstruction technique. COMPARISON:  CT head June 23, 2022 FINDINGS: Brain: No evidence of acute infarction, hemorrhage, hydrocephalus, extra-axial collection or mass lesion/mass effect. Vascular: No hyperdense vessel.  Calcific atherosclerosis. Skull: No acute fracture. Sinuses/Orbits: Mild mucosal thickening.  No acute orbital findings. IMPRESSION: No evidence of acute intracranial abnormality. Electronically Signed   By: Feliberto Harts M.D.   On: 08/05/2023 15:56   DG Chest Portable 1 View  Result Date: 08/05/2023 CLINICAL DATA:  Chest pain and shortness of breath. EXAM: PORTABLE CHEST 1 VIEW COMPARISON:  05/17/2023. FINDINGS: Cardiac silhouette is mildly enlarged. No mediastinal or hilar masses. Linear and patchy opacity noted at the right lung base suspected to be atelectasis. Pneumonia not excluded. Mild linear opacity at the left lung base consistent with atelectasis. Remainder of the lungs is clear. No convincing pleural effusion.  No pneumothorax. Skeletal structures are grossly intact. IMPRESSION: 1. Right lung base opacity, likely atelectasis. Pneumonia not excluded. 2. Mild left lung base atelectasis. Electronically Signed   By: Amie Portland M.D.   On: 08/05/2023 14:35    Procedures Procedures    Medications Ordered in ED Medications  albuterol (VENTOLIN HFA) 108 (90 Base) MCG/ACT inhaler 2 puff  (has no administration in time range)  iohexol (OMNIPAQUE) 350 MG/ML injection 75 mL (75 mLs Intravenous Contrast Given 08/05/23 1550)  magnesium sulfate IVPB 2 g 50 mL (0 g Intravenous Stopped 08/05/23 1740)  potassium chloride SA (KLOR-CON M) CR tablet 40 mEq (40 mEq Oral Given 08/05/23 1631)    ED  Course/ Medical Decision Making/ A&P                                 Medical Decision Making Amount and/or Complexity of Data Reviewed Labs: ordered. Radiology: ordered.  Risk Prescription drug management.   This patient complains of headache dizziness chest pain shortness of breath; this involves an extensive number of treatment Options and is a complaint that carries with it a high risk of complications and morbidity. The differential includes stroke, bleed, dissection, arrhythmia, CHF, infection, ACS  I ordered, reviewed and interpreted labs, which included CBC with mildly elevated white count, chemistries with low potassium low by museum, troponins flat, BNP elevated, lactate normal, COVID-negative I ordered medication oral potassium IV magnesium and reviewed PMP when indicated. I ordered imaging studies which included chest x-ray, CT head, CT angio chest abdomen and pelvis and I independently    visualized and interpreted imaging which showed no evidence of dissection.  Does have some pneumonitis.  No evidence of stroke. Additional history obtained from patient's wife Previous records obtained and reviewed in epic including recent cardiology notes Cardiac monitoring reviewed, normal sinus rhythm Social determinants considered, no significant barriers Critical Interventions: None  After the interventions stated above, I reevaluated the patient and found patient to be symptomatically improved not requiring Admission and further testing considered, his care is signed out to Dr. Clarice Pole to follow-up on patient's response to treatment.  Likely will need trending ambulation and if not  desatting and he is back to baseline can follow-up closely outpatient with PCP.  Will start him on some antibiotics for the pneumonitis.         Final Clinical Impression(s) / ED Diagnoses Final diagnoses:  Nonspecific chest pain  Hypokalemia  Hypomagnesemia  Pneumonitis    Rx / DC Orders ED Discharge Orders          Ordered    doxycycline (VIBRAMYCIN) 100 MG capsule  2 times daily,   Status:  Discontinued        08/05/23 1632    doxycycline (VIBRAMYCIN) 100 MG capsule  2 times daily        08/05/23 1634              Terrilee Files, MD 08/05/23 1800

## 2023-08-05 NOTE — ED Notes (Signed)
Pt provided with meal and beverage.

## 2023-08-06 ENCOUNTER — Other Ambulatory Visit: Payer: Self-pay

## 2023-08-06 ENCOUNTER — Telehealth: Payer: Self-pay | Admitting: Internal Medicine

## 2023-08-06 ENCOUNTER — Emergency Department (HOSPITAL_COMMUNITY): Payer: PPO

## 2023-08-06 ENCOUNTER — Inpatient Hospital Stay (HOSPITAL_COMMUNITY): Payer: PPO

## 2023-08-06 ENCOUNTER — Inpatient Hospital Stay (HOSPITAL_COMMUNITY)
Admission: EM | Admit: 2023-08-06 | Discharge: 2023-08-12 | DRG: 288 | Disposition: A | Discharge status: Home / Self Care | Payer: PPO | Attending: Internal Medicine | Admitting: Internal Medicine

## 2023-08-06 ENCOUNTER — Encounter (HOSPITAL_COMMUNITY): Payer: Self-pay

## 2023-08-06 ENCOUNTER — Other Ambulatory Visit (HOSPITAL_COMMUNITY): Payer: PPO

## 2023-08-06 DIAGNOSIS — I5030 Unspecified diastolic (congestive) heart failure: Secondary | ICD-10-CM | POA: Diagnosis present

## 2023-08-06 DIAGNOSIS — I2510 Atherosclerotic heart disease of native coronary artery without angina pectoris: Secondary | ICD-10-CM | POA: Diagnosis not present

## 2023-08-06 DIAGNOSIS — E669 Obesity, unspecified: Secondary | ICD-10-CM | POA: Diagnosis not present

## 2023-08-06 DIAGNOSIS — I340 Nonrheumatic mitral (valve) insufficiency: Secondary | ICD-10-CM | POA: Diagnosis not present

## 2023-08-06 DIAGNOSIS — Z823 Family history of stroke: Secondary | ICD-10-CM

## 2023-08-06 DIAGNOSIS — D860 Sarcoidosis of lung: Secondary | ICD-10-CM | POA: Diagnosis not present

## 2023-08-06 DIAGNOSIS — N179 Acute kidney failure, unspecified: Secondary | ICD-10-CM | POA: Diagnosis not present

## 2023-08-06 DIAGNOSIS — R042 Hemoptysis: Secondary | ICD-10-CM | POA: Diagnosis present

## 2023-08-06 DIAGNOSIS — I5032 Chronic diastolic (congestive) heart failure: Secondary | ICD-10-CM | POA: Diagnosis not present

## 2023-08-06 DIAGNOSIS — B9561 Methicillin susceptible Staphylococcus aureus infection as the cause of diseases classified elsewhere: Secondary | ICD-10-CM | POA: Diagnosis present

## 2023-08-06 DIAGNOSIS — Z8249 Family history of ischemic heart disease and other diseases of the circulatory system: Secondary | ICD-10-CM

## 2023-08-06 DIAGNOSIS — R931 Abnormal findings on diagnostic imaging of heart and coronary circulation: Secondary | ICD-10-CM

## 2023-08-06 DIAGNOSIS — Z79899 Other long term (current) drug therapy: Secondary | ICD-10-CM

## 2023-08-06 DIAGNOSIS — Z1152 Encounter for screening for COVID-19: Secondary | ICD-10-CM

## 2023-08-06 DIAGNOSIS — Z87891 Personal history of nicotine dependence: Secondary | ICD-10-CM

## 2023-08-06 DIAGNOSIS — I361 Nonrheumatic tricuspid (valve) insufficiency: Secondary | ICD-10-CM | POA: Diagnosis not present

## 2023-08-06 DIAGNOSIS — N1832 Chronic kidney disease, stage 3b: Secondary | ICD-10-CM | POA: Diagnosis present

## 2023-08-06 DIAGNOSIS — T501X6A Underdosing of loop [high-ceiling] diuretics, initial encounter: Secondary | ICD-10-CM | POA: Diagnosis present

## 2023-08-06 DIAGNOSIS — Z9049 Acquired absence of other specified parts of digestive tract: Secondary | ICD-10-CM

## 2023-08-06 DIAGNOSIS — G4700 Insomnia, unspecified: Secondary | ICD-10-CM | POA: Diagnosis not present

## 2023-08-06 DIAGNOSIS — M5124 Other intervertebral disc displacement, thoracic region: Secondary | ICD-10-CM | POA: Diagnosis not present

## 2023-08-06 DIAGNOSIS — Z888 Allergy status to other drugs, medicaments and biological substances status: Secondary | ICD-10-CM

## 2023-08-06 DIAGNOSIS — R609 Edema, unspecified: Secondary | ICD-10-CM | POA: Diagnosis not present

## 2023-08-06 DIAGNOSIS — Z8619 Personal history of other infectious and parasitic diseases: Secondary | ICD-10-CM

## 2023-08-06 DIAGNOSIS — J180 Bronchopneumonia, unspecified organism: Secondary | ICD-10-CM | POA: Diagnosis not present

## 2023-08-06 DIAGNOSIS — I339 Acute and subacute endocarditis, unspecified: Secondary | ICD-10-CM | POA: Diagnosis not present

## 2023-08-06 DIAGNOSIS — Z91199 Patient's noncompliance with other medical treatment and regimen due to unspecified reason: Secondary | ICD-10-CM

## 2023-08-06 DIAGNOSIS — R7881 Bacteremia: Principal | ICD-10-CM | POA: Diagnosis present

## 2023-08-06 DIAGNOSIS — R9431 Abnormal electrocardiogram [ECG] [EKG]: Secondary | ICD-10-CM | POA: Diagnosis present

## 2023-08-06 DIAGNOSIS — D151 Benign neoplasm of heart: Secondary | ICD-10-CM | POA: Diagnosis not present

## 2023-08-06 DIAGNOSIS — I10 Essential (primary) hypertension: Secondary | ICD-10-CM | POA: Diagnosis present

## 2023-08-06 DIAGNOSIS — E876 Hypokalemia: Secondary | ICD-10-CM | POA: Diagnosis not present

## 2023-08-06 DIAGNOSIS — I059 Rheumatic mitral valve disease, unspecified: Secondary | ICD-10-CM

## 2023-08-06 DIAGNOSIS — J189 Pneumonia, unspecified organism: Secondary | ICD-10-CM | POA: Diagnosis not present

## 2023-08-06 DIAGNOSIS — M4313 Spondylolisthesis, cervicothoracic region: Secondary | ICD-10-CM | POA: Diagnosis not present

## 2023-08-06 DIAGNOSIS — Z8674 Personal history of sudden cardiac arrest: Secondary | ICD-10-CM | POA: Diagnosis not present

## 2023-08-06 DIAGNOSIS — M4804 Spinal stenosis, thoracic region: Secondary | ICD-10-CM | POA: Diagnosis not present

## 2023-08-06 DIAGNOSIS — Z981 Arthrodesis status: Secondary | ICD-10-CM

## 2023-08-06 DIAGNOSIS — Z6829 Body mass index (BMI) 29.0-29.9, adult: Secondary | ICD-10-CM

## 2023-08-06 DIAGNOSIS — J440 Chronic obstructive pulmonary disease with acute lower respiratory infection: Secondary | ICD-10-CM | POA: Diagnosis present

## 2023-08-06 DIAGNOSIS — Z86711 Personal history of pulmonary embolism: Secondary | ICD-10-CM | POA: Diagnosis present

## 2023-08-06 DIAGNOSIS — M48061 Spinal stenosis, lumbar region without neurogenic claudication: Secondary | ICD-10-CM | POA: Diagnosis not present

## 2023-08-06 DIAGNOSIS — N400 Enlarged prostate without lower urinary tract symptoms: Secondary | ICD-10-CM | POA: Insufficient documentation

## 2023-08-06 DIAGNOSIS — I110 Hypertensive heart disease with heart failure: Secondary | ICD-10-CM | POA: Diagnosis not present

## 2023-08-06 DIAGNOSIS — I77810 Thoracic aortic ectasia: Secondary | ICD-10-CM | POA: Diagnosis present

## 2023-08-06 DIAGNOSIS — I330 Acute and subacute infective endocarditis: Principal | ICD-10-CM | POA: Diagnosis present

## 2023-08-06 DIAGNOSIS — M419 Scoliosis, unspecified: Secondary | ICD-10-CM | POA: Diagnosis not present

## 2023-08-06 DIAGNOSIS — Z7982 Long term (current) use of aspirin: Secondary | ICD-10-CM

## 2023-08-06 DIAGNOSIS — M7989 Other specified soft tissue disorders: Secondary | ICD-10-CM | POA: Diagnosis not present

## 2023-08-06 DIAGNOSIS — G4733 Obstructive sleep apnea (adult) (pediatric): Secondary | ICD-10-CM | POA: Diagnosis present

## 2023-08-06 DIAGNOSIS — E782 Mixed hyperlipidemia: Secondary | ICD-10-CM | POA: Diagnosis not present

## 2023-08-06 DIAGNOSIS — E785 Hyperlipidemia, unspecified: Secondary | ICD-10-CM | POA: Diagnosis present

## 2023-08-06 DIAGNOSIS — K219 Gastro-esophageal reflux disease without esophagitis: Secondary | ICD-10-CM | POA: Diagnosis not present

## 2023-08-06 DIAGNOSIS — I38 Endocarditis, valve unspecified: Secondary | ICD-10-CM | POA: Diagnosis not present

## 2023-08-06 DIAGNOSIS — Z885 Allergy status to narcotic agent status: Secondary | ICD-10-CM

## 2023-08-06 DIAGNOSIS — T465X6A Underdosing of other antihypertensive drugs, initial encounter: Secondary | ICD-10-CM | POA: Diagnosis present

## 2023-08-06 DIAGNOSIS — Z8673 Personal history of transient ischemic attack (TIA), and cerebral infarction without residual deficits: Secondary | ICD-10-CM

## 2023-08-06 DIAGNOSIS — M51379 Other intervertebral disc degeneration, lumbosacral region without mention of lumbar back pain or lower extremity pain: Secondary | ICD-10-CM | POA: Diagnosis not present

## 2023-08-06 DIAGNOSIS — M5126 Other intervertebral disc displacement, lumbar region: Secondary | ICD-10-CM | POA: Diagnosis not present

## 2023-08-06 DIAGNOSIS — I130 Hypertensive heart and chronic kidney disease with heart failure and stage 1 through stage 4 chronic kidney disease, or unspecified chronic kidney disease: Secondary | ICD-10-CM | POA: Diagnosis present

## 2023-08-06 LAB — PROTIME-INR
INR: 1.2 (ref 0.8–1.2)
Prothrombin Time: 15 s (ref 11.4–15.2)

## 2023-08-06 LAB — I-STAT CG4 LACTIC ACID, ED
Lactic Acid, Venous: 0.6 mmol/L (ref 0.5–1.9)
Lactic Acid, Venous: 0.6 mmol/L (ref 0.5–1.9)

## 2023-08-06 LAB — BLOOD CULTURE ID PANEL (REFLEXED) - BCID2

## 2023-08-06 LAB — CBC WITH DIFFERENTIAL/PLATELET
Abs Immature Granulocytes: 0.04 10*3/uL (ref 0.00–0.07)
Basophils Absolute: 0.1 10*3/uL (ref 0.0–0.1)
Basophils Relative: 1 %
Eosinophils Absolute: 0.5 10*3/uL (ref 0.0–0.5)
Eosinophils Relative: 5 %
HCT: 40.5 % (ref 39.0–52.0)
Hemoglobin: 13.7 g/dL (ref 13.0–17.0)
Immature Granulocytes: 0 %
Lymphocytes Relative: 10 %
Lymphs Abs: 1.1 10*3/uL (ref 0.7–4.0)
MCH: 31.2 pg (ref 26.0–34.0)
MCHC: 33.8 g/dL (ref 30.0–36.0)
MCV: 92.3 fL (ref 80.0–100.0)
Monocytes Absolute: 0.6 10*3/uL (ref 0.1–1.0)
Monocytes Relative: 6 %
Neutro Abs: 8.2 10*3/uL — ABNORMAL HIGH (ref 1.7–7.7)
Neutrophils Relative %: 78 %
Platelets: 192 10*3/uL (ref 150–400)
RBC: 4.39 MIL/uL (ref 4.22–5.81)
RDW: 14.7 % (ref 11.5–15.5)
WBC: 10.4 10*3/uL (ref 4.0–10.5)
nRBC: 0 % (ref 0.0–0.2)

## 2023-08-06 LAB — URINALYSIS, W/ REFLEX TO CULTURE (INFECTION SUSPECTED)
Bacteria, UA: NONE SEEN
Bilirubin Urine: NEGATIVE
Glucose, UA: 50 mg/dL — AB
Ketones, ur: NEGATIVE mg/dL
Leukocytes,Ua: NEGATIVE
Nitrite: NEGATIVE
Protein, ur: 100 mg/dL — AB
Specific Gravity, Urine: 1.008 (ref 1.005–1.030)
pH: 5 (ref 5.0–8.0)

## 2023-08-06 LAB — COMPREHENSIVE METABOLIC PANEL
ALT: 12 U/L (ref 0–44)
AST: 17 U/L (ref 15–41)
Albumin: 3.4 g/dL — ABNORMAL LOW (ref 3.5–5.0)
Alkaline Phosphatase: 65 U/L (ref 38–126)
Anion gap: 6 (ref 5–15)
BUN: 10 mg/dL (ref 8–23)
CO2: 25 mmol/L (ref 22–32)
Calcium: 9.1 mg/dL (ref 8.9–10.3)
Chloride: 109 mmol/L (ref 98–111)
Creatinine, Ser: 1.63 mg/dL — ABNORMAL HIGH (ref 0.61–1.24)
GFR, Estimated: 46 mL/min — ABNORMAL LOW (ref >60)
Glucose, Bld: 112 mg/dL — ABNORMAL HIGH (ref 70–99)
Potassium: 3.9 mmol/L (ref 3.5–5.1)
Sodium: 140 mmol/L (ref 135–145)
Total Bilirubin: 0.8 mg/dL (ref <1.2)
Total Protein: 6.6 g/dL (ref 6.5–8.1)

## 2023-08-06 LAB — STREP PNEUMONIAE URINARY ANTIGEN: Strep Pneumo Urinary Antigen: NEGATIVE

## 2023-08-06 LAB — APTT: aPTT: 36 s (ref 24–36)

## 2023-08-06 MED ORDER — SODIUM CHLORIDE 0.9 % IV SOLN: 2.0000 g | INTRAVENOUS | Status: DC

## 2023-08-06 MED ORDER — ATORVASTATIN CALCIUM 10 MG PO TABS
20.0000 mg | ORAL_TABLET | Freq: Every day | ORAL | Status: DC
  Administered 2023-08-07 – 2023-08-12 (×6): 20 mg via ORAL
  Filled 2023-08-06 (×6): qty 2

## 2023-08-06 MED ORDER — DAPAGLIFLOZIN PROPANEDIOL 10 MG PO TABS
10.0000 mg | ORAL_TABLET | Freq: Every day | ORAL | Status: DC
  Administered 2023-08-07 – 2023-08-08 (×2): 10 mg via ORAL
  Filled 2023-08-06 (×3): qty 1

## 2023-08-06 MED ORDER — IPRATROPIUM-ALBUTEROL 0.5-2.5 (3) MG/3ML IN SOLN
3.0000 mL | Freq: Four times a day (QID) | RESPIRATORY_TRACT | Status: DC
  Administered 2023-08-06: 3 mL via RESPIRATORY_TRACT
  Filled 2023-08-06: qty 3

## 2023-08-06 MED ORDER — UMECLIDINIUM BROMIDE 62.5 MCG/ACT IN AEPB: 1.0000 | INHALATION_SPRAY | Freq: Every day | RESPIRATORY_TRACT | Status: DC

## 2023-08-06 MED ORDER — ASPIRIN 81 MG PO TBEC
81.0000 mg | DELAYED_RELEASE_TABLET | Freq: Every day | ORAL | Status: DC
  Administered 2023-08-07 – 2023-08-12 (×6): 81 mg via ORAL
  Filled 2023-08-06 (×6): qty 1

## 2023-08-06 MED ORDER — ENOXAPARIN SODIUM 40 MG/0.4ML IJ SOSY
40.0000 mg | PREFILLED_SYRINGE | INTRAMUSCULAR | Status: DC
  Administered 2023-08-06 – 2023-08-08 (×3): 40 mg via SUBCUTANEOUS
  Filled 2023-08-06 (×3): qty 0.4

## 2023-08-06 MED ORDER — DILTIAZEM HCL ER COATED BEADS 360 MG PO CP24
360.0000 mg | ORAL_CAPSULE | Freq: Every day | ORAL | Status: DC
  Administered 2023-08-07 – 2023-08-12 (×6): 360 mg via ORAL
  Filled 2023-08-06: qty 2
  Filled 2023-08-06: qty 1
  Filled 2023-08-06: qty 2
  Filled 2023-08-06 (×2): qty 1
  Filled 2023-08-06: qty 2

## 2023-08-06 MED ORDER — LACTATED RINGERS IV BOLUS (SEPSIS)
500.0000 mL | Freq: Once | INTRAVENOUS | Status: AC
  Administered 2023-08-06: 500 mL via INTRAVENOUS

## 2023-08-06 MED ORDER — ACETAMINOPHEN 650 MG RE SUPP: 650.0000 mg | Freq: Four times a day (QID) | RECTAL | Status: DC | PRN

## 2023-08-06 MED ORDER — SACUBITRIL-VALSARTAN 97-103 MG PO TABS
1.0000 | ORAL_TABLET | Freq: Two times a day (BID) | ORAL | Status: DC
  Administered 2023-08-06 – 2023-08-08 (×5): 1 via ORAL
  Filled 2023-08-06 (×6): qty 1

## 2023-08-06 MED ORDER — ISOSORBIDE MONONITRATE ER 60 MG PO TB24
90.0000 mg | ORAL_TABLET | Freq: Every day | ORAL | Status: DC
  Administered 2023-08-07 – 2023-08-12 (×6): 90 mg via ORAL
  Filled 2023-08-06: qty 3
  Filled 2023-08-06 (×4): qty 1
  Filled 2023-08-06: qty 3

## 2023-08-06 MED ORDER — SACUBITRIL-VALSARTAN 97-103 MG PO TABS: 1.0000 | ORAL_TABLET | Freq: Every day | ORAL | Status: DC

## 2023-08-06 MED ORDER — POLYETHYLENE GLYCOL 3350 17 G PO PACK: 17.0000 g | PACK | Freq: Every day | ORAL | Status: DC | PRN

## 2023-08-06 MED ORDER — METOPROLOL SUCCINATE ER 50 MG PO TB24
200.0000 mg | ORAL_TABLET | Freq: Every day | ORAL | Status: DC
  Administered 2023-08-07 – 2023-08-12 (×6): 200 mg via ORAL
  Filled 2023-08-06 (×2): qty 4
  Filled 2023-08-06 (×2): qty 8
  Filled 2023-08-06 (×2): qty 4

## 2023-08-06 MED ORDER — TORSEMIDE 20 MG PO TABS
20.0000 mg | ORAL_TABLET | Freq: Every day | ORAL | Status: DC
  Administered 2023-08-06 – 2023-08-08 (×3): 20 mg via ORAL
  Filled 2023-08-06 (×3): qty 1

## 2023-08-06 MED ORDER — SODIUM CHLORIDE 0.9% FLUSH
10.0000 mL | Freq: Two times a day (BID) | INTRAVENOUS | Status: DC
  Administered 2023-08-06 – 2023-08-12 (×13): 10 mL via INTRAVENOUS

## 2023-08-06 MED ORDER — SODIUM CHLORIDE 0.9 % IV SOLN
2.0000 g | Freq: Once | INTRAVENOUS | Status: AC
  Administered 2023-08-06: 2 g via INTRAVENOUS
  Filled 2023-08-06: qty 20

## 2023-08-06 MED ORDER — DOXYCYCLINE HYCLATE 100 MG PO TABS
100.0000 mg | ORAL_TABLET | Freq: Two times a day (BID) | ORAL | Status: DC
  Administered 2023-08-06 – 2023-08-07 (×2): 100 mg via ORAL
  Filled 2023-08-06 (×2): qty 1

## 2023-08-06 MED ORDER — TAMSULOSIN HCL 0.4 MG PO CAPS
0.4000 mg | ORAL_CAPSULE | Freq: Every day | ORAL | Status: DC
  Administered 2023-08-07 – 2023-08-12 (×6): 0.4 mg via ORAL
  Filled 2023-08-06 (×6): qty 1

## 2023-08-06 MED ORDER — TRAZODONE HCL 50 MG PO TABS
150.0000 mg | ORAL_TABLET | Freq: Every evening | ORAL | Status: DC | PRN
  Administered 2023-08-07 – 2023-08-11 (×6): 150 mg via ORAL
  Filled 2023-08-06 (×6): qty 3

## 2023-08-06 MED ORDER — SODIUM CHLORIDE 0.9 % IV SOLN: 1.0000 g | INTRAVENOUS | Status: DC

## 2023-08-06 MED ORDER — CEFAZOLIN SODIUM-DEXTROSE 2-4 GM/100ML-% IV SOLN
2.0000 g | Freq: Three times a day (TID) | INTRAVENOUS | Status: DC
  Administered 2023-08-07 – 2023-08-12 (×16): 2 g via INTRAVENOUS
  Filled 2023-08-06 (×16): qty 100

## 2023-08-06 MED ORDER — DOXYCYCLINE HYCLATE 100 MG PO TABS
100.0000 mg | ORAL_TABLET | Freq: Once | ORAL | Status: AC
  Administered 2023-08-06: 100 mg via ORAL
  Filled 2023-08-06: qty 1

## 2023-08-06 MED ORDER — DM-GUAIFENESIN ER 30-600 MG PO TB12
1.0000 | ORAL_TABLET | Freq: Two times a day (BID) | ORAL | Status: DC
  Administered 2023-08-06 – 2023-08-12 (×12): 1 via ORAL
  Filled 2023-08-06 (×13): qty 1

## 2023-08-06 MED ORDER — HYDRALAZINE HCL 50 MG PO TABS
100.0000 mg | ORAL_TABLET | Freq: Two times a day (BID) | ORAL | Status: DC
  Administered 2023-08-06 – 2023-08-12 (×11): 100 mg via ORAL
  Filled 2023-08-06 (×12): qty 2

## 2023-08-06 MED ORDER — IPRATROPIUM-ALBUTEROL 0.5-2.5 (3) MG/3ML IN SOLN
3.0000 mL | Freq: Three times a day (TID) | RESPIRATORY_TRACT | Status: DC
  Filled 2023-08-06: qty 3

## 2023-08-06 MED ORDER — ALBUTEROL SULFATE (2.5 MG/3ML) 0.083% IN NEBU
5.0000 mg | INHALATION_SOLUTION | Freq: Once | RESPIRATORY_TRACT | Status: AC
Start: 2023-08-06 — End: 2023-08-06
  Administered 2023-08-06: 5 mg via RESPIRATORY_TRACT
  Filled 2023-08-06: qty 6

## 2023-08-06 MED ORDER — SODIUM CHLORIDE 0.9 % IV SOLN: 1.0000 g | Freq: Once | INTRAVENOUS | Status: DC

## 2023-08-06 MED ORDER — ACETAMINOPHEN 325 MG PO TABS
650.0000 mg | ORAL_TABLET | Freq: Four times a day (QID) | ORAL | Status: DC | PRN
  Administered 2023-08-08 – 2023-08-09 (×3): 650 mg via ORAL
  Filled 2023-08-06 (×3): qty 2

## 2023-08-06 NOTE — ED Notes (Signed)
ED TO INPATIENT HANDOFF REPORT  ED Nurse Name and Phone #: Einar Grad [phone]  S Name/Age/Gender [name] 67 y.o. male Room/Bed: 027C/027C  Code Status   Code Status: Prior  Home/SNF/Other Home Patient oriented to: self, place, time, and situation Is this baseline? Yes   Triage Complete: Triage complete  Chief Complaint Bacteremia due to Staphylococcus aureus [R78.81, B95.61]  Triage Note Patient was here yesterday for chest pain and dizziness and was called back this am due to +blood culture and needs to started on IV antibiotics.  Patient has been coughing up blood this am and complains of a headache.  Wife reports BP was SBP190s this morning and patient was confused.    Allergies Allergies  Allergen Reactions   Codeine Nausea And Vomiting   Hydrochlorothiazide Other (See Comments)    Unknown per Pt.    Hydrocodone-Acetaminophen Nausea And Vomiting   Hydromorphone Other (See Comments)    hallucinations     Level of Care/Admitting Diagnosis ED Disposition     ED Disposition  Admit   Condition  --   Comment  Hospital Area: [facility] [[id number]]  Level of Care: Telemetry Medical [104]  May admit patient to [facility] or [facility] if equivalent level of care is available:: No  Covid Evaluation: Asymptomatic - no recent exposure (last 10 days) testing not required  Diagnosis: Bacteremia due to Staphylococcus aureus [604540]  Admitting Physician: Briscoe Burns [[id number]]  Attending Physician: Briscoe Burns [[id number]]  Certification:: I certify this patient will need inpatient services for at least 2 midnights  Expected Medical Readiness: 08/08/2023          B Medical/Surgery History Past Medical History:  Diagnosis Date   Back pain    CHF (congestive heart failure) (HCC)    CKD (chronic kidney disease), stage III (HCC)    Closed nondisplaced fracture of first right metatarsal bone 06/23/2022   CVA (cerebral infarction)     Diastolic dysfunction    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   ED (erectile dysfunction)    GERD (gastroesophageal reflux disease)    Hypercalcemia    Hyperlipidemia    Hypertension    Insomnia    Long-term use of high-risk medication    Nephrocalcinosis    Nephrolithiasis    Obesity    Osteopenia    Pancreatitis    admitted 03/19/01-06/05/01   PE (pulmonary embolism) 01/30/2013   Proteinuria    Sarcoidosis    Sleep apnea    Smoker    Stroke [facility])    Past Surgical History:  Procedure Laterality Date   ABDOMINAL EXPLORATION SURGERY     BACK SURGERY     CHOLECYSTECTOMY     RIGHT/LEFT HEART CATH AND CORONARY ANGIOGRAPHY N/A 05/12/2021   Procedure: RIGHT/LEFT HEART CATH AND CORONARY ANGIOGRAPHY;  Surgeon: Elder Negus, MD;  Location: [facility];  Service: Cardiovascular;  Laterality: N/A;   SHOULDER ARTHROSCOPY Right 10/30/2020   Procedure: ARTHROSCOPY SHOULDER WITH EXTENSIVE DEBRIDEMENT;  Surgeon: Kathryne Hitch, MD;  Location: [facility];  Service: Orthopedics;  Laterality: Right;   SHOULDER SURGERY     TRACHEOSTOMY     closed     A IV Location/Drains/Wounds Patient Lines/Drains/Airways Status     Active Line/Drains/Airways     Name Placement date Placement time Site Days   Peripheral IV 08/06/23 20 G Right Antecubital 08/06/23  1212  Antecubital  less  than 1            Intake/Output Last 24 hours  Intake/Output Summary (Last 24 hours) at 08/06/2023 1438 Last data filed at 08/06/2023 1411 Gross per 24 hour  Intake 599.77 ml  Output --  Net 599.77 ml    Labs/Imaging Results for orders placed or performed during the hospital encounter of 08/06/23 (from the past 48 hour(s))  Comprehensive metabolic panel     Status: Abnormal   Collection Time: 08/06/23 12:18 PM  Result Value Ref Range   Sodium 140 135 - 145 mmol/L   Potassium 3.9 3.5 - 5.1 mmol/L    Comment: DELTA CHECK NOTED    Chloride 109 98 - 111 mmol/L   CO2 25 22 - 32 mmol/L   Glucose, Bld 112 (H) 70 - 99 mg/dL    Comment: Glucose reference range applies only to samples taken after fasting for at least 8 hours.   BUN 10 8 - 23 mg/dL   Creatinine, Ser 4.09 (H) 0.61 - 1.24 mg/dL   Calcium 9.1 8.9 - 81.1 mg/dL    Comment: DELTA CHECK NOTED   Total Protein 6.6 6.5 - 8.1 g/dL   Albumin 3.4 (L) 3.5 - 5.0 g/dL   AST 17 15 - 41 U/L   ALT 12 0 - 44 U/L   Alkaline Phosphatase 65 38 - 126 U/L   Total Bilirubin 0.8 <1.2 mg/dL   GFR, Estimated 46 (L) >60 mL/min    Comment: (NOTE) Calculated using the CKD-EPI Creatinine Equation (2021)    Anion gap 6 5 - 15    Comment: Performed at [facility], [address], [place], [place] [address]  CBC with Differential     Status: Abnormal   Collection Time: 08/06/23 12:18 PM  Result Value Ref Range   WBC 10.4 4.0 - 10.5 K/uL   RBC 4.39 4.22 - 5.81 MIL/uL   Hemoglobin 13.7 13.0 - 17.0 g/dL   HCT 29.5 62.1 - 30.8 %   MCV 92.3 80.0 - 100.0 fL   MCH 31.2 26.0 - 34.0 pg   MCHC 33.8 30.0 - 36.0 g/dL   RDW 65.7 84.6 - 96.2 %   Platelets 192 150 - 400 K/uL   nRBC 0.0 0.0 - 0.2 %   Neutrophils Relative % 78 %   Neutro Abs 8.2 (H) 1.7 - 7.7 K/uL   Lymphocytes Relative 10 %   Lymphs Abs 1.1 0.7 - 4.0 K/uL   Monocytes Relative 6 %   Monocytes Absolute 0.6 0.1 - 1.0 K/uL   Eosinophils Relative 5 %   Eosinophils Absolute 0.5 0.0 - 0.5 K/uL   Basophils Relative 1 %   Basophils Absolute 0.1 0.0 - 0.1 K/uL   Immature Granulocytes 0 %   Abs Immature Granulocytes 0.04 0.00 - 0.07 K/uL    Comment: Performed at [facility], [address] [place], [place] [address]  Protime-INR     Status: None   Collection Time: 08/06/23 12:18 PM  Result Value Ref Range   Prothrombin Time 15.0 11.4 - 15.2 seconds   INR 1.2 0.8 - 1.2    Comment: (NOTE) INR goal varies based on device and disease states. Performed at [facility], [address], [place],  [place] [address]   APTT     Status: None   Collection Time: 08/06/23 12:18 PM  Result Value Ref Range   aPTT 36 24 - 36 seconds    Comment: Performed at [facility]  [facility], [address] [place], [place] [address]  I-Stat Lactic Acid, ED     Status: None   Collection Time: 08/06/23 12:26 PM  Result Value Ref Range   Lactic Acid, Venous 0.6 0.5 - 1.9 mmol/L  I-Stat Lactic Acid, ED     Status: None   Collection Time: 08/06/23  1:42 PM  Result Value Ref Range   Lactic Acid, Venous 0.6 0.5 - 1.9 mmol/L   DG Chest Port 1 View  Result Date: 08/06/2023 CLINICAL DATA:  Questionable sepsis EXAM: PORTABLE CHEST 1 VIEW COMPARISON:  Yesterday FINDINGS: Streaky density in the lower lungs, stable. There is bronchopneumonia by recent CT. Borderline heart size is stable. No edema, effusion, or pneumothorax. IMPRESSION: Stable bronchopneumonia at the lung bases. Electronically Signed   By: Tiburcio Pea M.D.   On: 08/06/2023 12:16   CT Angio Chest/Abd/Pel for Dissection W and/or W/WO  Result Date: 08/05/2023 CLINICAL DATA:  Chest pain shortness of breath. Concern for acute aortic syndrome. EXAM: CT ANGIOGRAPHY CHEST, ABDOMEN AND PELVIS TECHNIQUE: Non-contrast CT of the chest was initially obtained. Multidetector CT imaging through the chest, abdomen and pelvis was performed using the standard protocol during bolus administration of intravenous contrast. Multiplanar reconstructed images and MIPs were obtained and reviewed to evaluate the vascular anatomy. RADIATION DOSE REDUCTION: This exam was performed according to the departmental dose-optimization program which includes automated exposure control, adjustment of the mA and/or kV according to patient size and/or use of iterative reconstruction technique. CONTRAST:  75mL OMNIPAQUE IOHEXOL 350 MG/ML SOLN COMPARISON:  CT abdomen pelvis, 07/20/2023.  CTA chest, 05/17/2023. FINDINGS: CTA CHEST FINDINGS Cardiovascular: Ascending thoracic aorta measures 4 cm in  diameter. No dissection. Minor atherosclerosis. No atherosclerotic ulcer. Aortic arch branch vessels are widely patent. Heart is normal in size. Three-vessel coronary artery calcifications. No pericardial effusion. Mediastinum/Nodes: No neck base, mediastinal or hilar masses. No enlarged lymph nodes. Multiple old calcified mediastinal and bilateral hilar nodes are noted, stable. Trachea esophagus are unremarkable. Lungs/Pleura: Patchy small confluent and ground-glass opacities no in the right middle and lower lobe, to a lesser degree in the left upper lobe lingula. Additional linear opacities noted in the left lower lobe at the lung base consistent with atelectasis. Linear opacity noted in the right lateral upper lobe, consistent with scarring or atelectasis. Compared to the prior CT angiogram of the chest from 05/17/2023, left lower lobe atelectasis is improved. Other patchy areas of opacity are new. No pleural effusion or pneumothorax. Musculoskeletal: No fracture or acute finding. No bone lesion. No chest wall mass. Review of the MIP images confirms the above findings. CTA ABDOMEN AND PELVIS FINDINGS VASCULAR Aorta: Minor atherosclerosis.  No aneurysm or dissection. Celiac: Patent without evidence of aneurysm, dissection, vasculitis or significant stenosis. SMA: Patent without evidence of aneurysm, dissection, vasculitis or significant stenosis. Renals: Both renal arteries are patent without evidence of aneurysm, dissection, vasculitis, fibromuscular dysplasia or significant stenosis. IMA: Patent without evidence of aneurysm, dissection, vasculitis or significant stenosis. Inflow: Patent without evidence of aneurysm, dissection, vasculitis or significant stenosis. Veins: No obvious venous abnormality within the limitations of this arterial phase study. Review of the MIP images confirms the above findings. NON-VASCULAR Hepatobiliary: No focal liver abnormality is seen. Status post cholecystectomy. No biliary  dilatation. Pancreas: Unremarkable. No pancreatic ductal dilatation or surrounding inflammatory changes. Spleen: Normal in size without focal abnormality. Adrenals/Urinary Tract: No adrenal masses. Bilateral renal cortical thinning. Stable nonobstructing stone, lower pole the left kidney. No renal masses. No other  intrarenal stones. No hydronephrosis. Normal ureters. Normal bladder. Stomach/Bowel: Stomach is within normal limits. Appendix appears normal. No evidence of bowel wall thickening, distention, or inflammatory changes. Lymphatic: No enlarged lymph nodes. Reproductive: Unremarkable. Other: No abdominal wall hernia or abnormality. No abdominopelvic ascites. Musculoskeletal: No fracture or acute finding. No bone lesion. Stable changes from a prior fusion, L3 through L5. Review of the MIP images confirms the above findings. IMPRESSION: 1. No evidence of acute aortic syndrome. No aortic aneurysm or dissection. 2. Patchy small confluent and ground-glass opacities in the right middle and lower lobe, to a lesser degree in the left upper lobe lingula. These are new since the prior CT angiogram of the chest from 05/17/2023 and are consistent with an infectious or inflammatory process. 3. Three-vessel coronary artery calcifications. 4. No acute findings within the abdomen or pelvis. Electronically Signed   By: Amie Portland M.D.   On: 08/05/2023 16:20   CT HEAD WO CONTRAST  Result Date: 08/05/2023 CLINICAL DATA:  Headache, new onset (Age >= 51y) EXAM: CT HEAD WITHOUT CONTRAST TECHNIQUE: Contiguous axial images were obtained from the base of the skull through the vertex without intravenous contrast. RADIATION DOSE REDUCTION: This exam was performed according to the departmental dose-optimization program which includes automated exposure control, adjustment of the mA and/or kV according to patient size and/or use of iterative reconstruction technique. COMPARISON:  CT head June 23, 2022 FINDINGS: Brain: No  evidence of acute infarction, hemorrhage, hydrocephalus, extra-axial collection or mass lesion/mass effect. Vascular: No hyperdense vessel.  Calcific atherosclerosis. Skull: No acute fracture. Sinuses/Orbits: Mild mucosal thickening.  No acute orbital findings. IMPRESSION: No evidence of acute intracranial abnormality. Electronically Signed   By: Feliberto Harts M.D.   On: 08/05/2023 15:56   DG Chest Portable 1 View  Result Date: 08/05/2023 CLINICAL DATA:  Chest pain and shortness of breath. EXAM: PORTABLE CHEST 1 VIEW COMPARISON:  05/17/2023. FINDINGS: Cardiac silhouette is mildly enlarged. No mediastinal or hilar masses. Linear and patchy opacity noted at the right lung base suspected to be atelectasis. Pneumonia not excluded. Mild linear opacity at the left lung base consistent with atelectasis. Remainder of the lungs is clear. No convincing pleural effusion.  No pneumothorax. Skeletal structures are grossly intact. IMPRESSION: 1. Right lung base opacity, likely atelectasis. Pneumonia not excluded. 2. Mild left lung base atelectasis. Electronically Signed   By: Amie Portland M.D.   On: 08/05/2023 14:35    Pending Labs Unresulted Labs (From admission, onward)     Start     Ordered   08/06/23 1139  Urinalysis, w/ Reflex to Culture (Infection Suspected) -Urine, Clean Catch  (Undifferentiated presentation (screening labs and basic nursing orders))  ONCE - URGENT,   URGENT       Question:  Specimen Source  Answer:  Urine, Clean Catch   08/06/23 1138   08/06/23 1138  Blood Culture (routine x 2)  (Undifferentiated presentation (screening labs and basic nursing orders))  BLOOD CULTURE X 2,   STAT      08/06/23 1138            Vitals/Pain Today's Vitals   08/06/23 1055 08/06/23 1056 08/06/23 1315  BP:  (!) 147/91 (!) 155/97  Pulse:  66 66  Resp:  17 19  Temp:  98.2 F (36.8 C)   SpO2:  94% 96%  Weight: 74.8 kg    Height: 6' (1.829 m)    PainSc: 0-No pain      Isolation  Precautions No active isolations  Medications Medications  sodium chloride flush (NS) 0.9 % injection 10 mL (10 mLs Intravenous Given 08/06/23 1215)  lactated ringers bolus 500 mL (0 mLs Intravenous Stopped 08/06/23 1314)  doxycycline (VIBRA-TABS) tablet 100 mg (100 mg Oral Given 08/06/23 1214)  albuterol (PROVENTIL) (2.5 MG/3ML) 0.083% nebulizer solution 5 mg (5 mg Nebulization Given 08/06/23 1216)  cefTRIAXone (ROCEPHIN) 2 g in sodium chloride 0.9 % 100 mL IVPB (0 g Intravenous Stopped 08/06/23 1411)    Mobility walks     Focused Assessments Pulmonary Assessment Handoff:  Lung sounds:          R Recommendations: See Admitting Provider Note  Report given to:   Additional Notes:

## 2023-08-06 NOTE — Sepsis Progress Note (Signed)
Sepsis protocol monitored by eLink ?

## 2023-08-06 NOTE — ED Provider Notes (Signed)
EMERGENCY DEPARTMENT AT [facility] Provider Note   CSN: [id number] Arrival date & time: 08/06/23  1047     History  Chief Complaint  Patient presents with   Abnormal Lab    [name] is a 67 y.o. male.  Patient is a 67 year old male who comes back after he was called for a positive blood culture.  He has a history of hypertension, sarcoidosis, hyperlipidemia, CHF, prior PE, CVA, chronic kidney disease.  He was seen here yesterday with complaints of chest pain and some dizziness.  He had a workup including CTA of his chest abdomen pelvis.  There is no dissection.  There was some concerns for patchy infiltrates in his lungs.  He was discharged home.  His blood culture and 1 vial was positive for Staph aureus.  He overall feels bad.  He has had some ongoing coughing.  History is obtained from the patient and his wife as patient has been noted to have some confusion per the wife over the last 24 hours.  He has had some productive cough with purulent sputum and at times with blood in the sputum.  He has had some increased wheezing and has used some nebulizer machines at home.  He has not had any noted fevers but has had some chills and bodyaches.  He had a little bit of pain in his upper abdomen.  No vomiting or diarrhea per the wife.  Reportedly has had positive culture for staph in the past after shoulder surgery.  Per chart review, he was admitted for staph septicemia in 2022.       Home Medications Prior to Admission medications   Medication Sig Start Date End Date Taking? Authorizing Provider  aspirin EC 81 MG tablet Take 81 mg by mouth daily. Patients Wife gave him 4 tablets this am. 08/06/2023.   Yes [provider]  atorvastatin (LIPITOR) 20 MG tablet Take 20 mg by mouth daily.   Yes [provider]  dapagliflozin propanediol (FARXIGA) 10 MG TABS tablet Take 1 tablet (10 mg total) by mouth daily before breakfast. 07/04/23  Yes Tolia,  Sunit, DO  diltiazem (CARDIZEM CD) 360 MG 24 hr capsule Take 1 capsule (360 mg total) by mouth daily. 11/17/22  Yes Custovic, Rozell Searing, DO  doxycycline (VIBRAMYCIN) 100 MG capsule Take 1 capsule (100 mg total) by mouth 2 (two) times daily. 08/05/23  Yes Terrilee Files, MD  hydrALAZINE (APRESOLINE) 100 MG tablet Take 100 mg by mouth 2 (two) times daily.   Yes [provider]  ipratropium-albuterol (DUONEB) 0.5-2.5 (3) MG/3ML SOLN Use 3 mLs by nebulization every 6 (six) hours for 20 days. 05/19/23 08/06/23 Yes Carollee Herter, DO  isosorbide mononitrate (IMDUR) 30 MG 24 hr tablet Take 3 tablets (90 mg total) by mouth daily. 09/22/22  Yes   metoprolol (TOPROL-XL) 200 MG 24 hr tablet Take 1 tablet (200 mg total) by mouth daily. 06/20/22  Yes   potassium chloride SA (KLOR-CON M) 20 MEQ tablet Take 1 tablet (20 mEq total) by mouth daily. 06/14/23  Yes Tolia, Sunit, DO  sacubitril-valsartan (ENTRESTO) 97-103 MG Take 1 tablet by mouth 2 (two) times daily. Patient taking differently: Take 1 tablet by mouth daily. 10/04/22  Yes Custovic, Rozell Searing, DO  tamsulosin (FLOMAX) 0.4 MG CAPS capsule Take 1 capsule by mouth daily 06/28/23  Yes   torsemide (DEMADEX) 20 MG tablet Take 1 tablet (20 mg total) by mouth daily. 06/14/23  Yes Tolia, Sunit, DO  traZODone (DESYREL)  150 MG tablet Take 1 tablet (150 mg total) by mouth at bedtime as needed for sleep. Patient taking differently: Take 150 mg by mouth at bedtime. 12/22/20  Yes Dohmeier, Porfirio Mylar, MD  triamcinolone cream (KENALOG) 0.1 % Apply to affected areas twice a day as needed for itching/inflammation. 09/09/21  Yes   VITAMIN D, CHOLECALCIFEROL, PO Take 1 capsule by mouth daily.   Yes [provider]      Allergies    Codeine, Hydrochlorothiazide, Hydrocodone-acetaminophen, and Hydromorphone    Review of Systems   Review of Systems  Constitutional:  Positive for fatigue. Negative for chills, diaphoresis and fever.  HENT:  Positive for congestion and  rhinorrhea. Negative for sneezing.   Eyes: Negative.   Respiratory:  Positive for cough and wheezing. Negative for chest tightness and shortness of breath.   Cardiovascular:  Negative for chest pain and leg swelling.  Gastrointestinal:  Positive for abdominal pain. Negative for blood in stool, diarrhea, nausea and vomiting.  Genitourinary:  Negative for difficulty urinating, flank pain, frequency and hematuria.  Musculoskeletal:  Negative for arthralgias and back pain.  Skin:  Negative for rash.  Neurological:  Negative for dizziness, speech difficulty, weakness, numbness and headaches.    Physical Exam Updated Vital Signs BP (!) 155/97   Pulse 66   Temp 98.2 F (36.8 C)   Resp 19   Ht 6' (1.829 m)   Wt 74.8 kg   SpO2 96%   BMI 22.38 kg/m  Physical Exam Constitutional:      Appearance: He is well-developed.  HENT:     Head: Normocephalic and atraumatic.  Eyes:     Pupils: Pupils are equal, round, and reactive to light.  Cardiovascular:     Rate and Rhythm: Normal rate and regular rhythm.     Heart sounds: Normal heart sounds.  Pulmonary:     Effort: Pulmonary effort is normal. No respiratory distress.     Breath sounds: Wheezing and rhonchi present. No rales.  Chest:     Chest wall: No tenderness.  Abdominal:     General: Bowel sounds are normal.     Palpations: Abdomen is soft.     Tenderness: There is no abdominal tenderness. There is no guarding or rebound.  Musculoskeletal:        General: Normal range of motion.     Cervical back: Normal range of motion and neck supple.  Lymphadenopathy:     Cervical: No cervical adenopathy.  Skin:    General: Skin is warm and dry.     Findings: No rash.  Neurological:     Mental Status: He is alert and oriented to person, place, and time.     ED Results / Procedures / Treatments   Labs (all labs ordered are listed, but only abnormal results are displayed) Labs Reviewed  COMPREHENSIVE METABOLIC PANEL - Abnormal; Notable  for the following components:      Result Value   Glucose, Bld 112 (*)    Creatinine, Ser 1.63 (*)    Albumin 3.4 (*)    GFR, Estimated 46 (*)    All other components within normal limits  CBC WITH DIFFERENTIAL/PLATELET - Abnormal; Notable for the following components:   Neutro Abs 8.2 (*)    All other components within normal limits  CULTURE, BLOOD (ROUTINE X 2)  CULTURE, BLOOD (ROUTINE X 2)  PROTIME-INR  APTT  URINALYSIS, W/ REFLEX TO CULTURE (INFECTION SUSPECTED)  I-STAT CG4 LACTIC ACID, ED  I-STAT CG4 LACTIC ACID, ED  EKG EKG Interpretation Date/Time:  Saturday August 06 2023 12:07:49 EST Ventricular Rate:  67 PR Interval:  197 QRS Duration:  99 QT Interval:  560 QTC Calculation: 592 R Axis:   -51  Text Interpretation: Sinus rhythm Left atrial enlargement LAD, consider left anterior fascicular block Abnormal R-wave progression, late transition Nonspecific T abnormalities, diffuse leads Prolonged QT interval Confirmed by Rolan Bucco [phone]) on 08/06/2023 12:17:34 PM  Radiology DG Chest Port 1 View  Result Date: 08/06/2023 CLINICAL DATA:  Questionable sepsis EXAM: PORTABLE CHEST 1 VIEW COMPARISON:  Yesterday FINDINGS: Streaky density in the lower lungs, stable. There is bronchopneumonia by recent CT. Borderline heart size is stable. No edema, effusion, or pneumothorax. IMPRESSION: Stable bronchopneumonia at the lung bases. Electronically Signed   By: Tiburcio Pea M.D.   On: 08/06/2023 12:16   CT Angio Chest/Abd/Pel for Dissection W and/or W/WO  Result Date: 08/05/2023 CLINICAL DATA:  Chest pain shortness of breath. Concern for acute aortic syndrome. EXAM: CT ANGIOGRAPHY CHEST, ABDOMEN AND PELVIS TECHNIQUE: Non-contrast CT of the chest was initially obtained. Multidetector CT imaging through the chest, abdomen and pelvis was performed using the standard protocol during bolus administration of intravenous contrast. Multiplanar reconstructed images and MIPs were  obtained and reviewed to evaluate the vascular anatomy. RADIATION DOSE REDUCTION: This exam was performed according to the departmental dose-optimization program which includes automated exposure control, adjustment of the mA and/or kV according to patient size and/or use of iterative reconstruction technique. CONTRAST:  75mL OMNIPAQUE IOHEXOL 350 MG/ML SOLN COMPARISON:  CT abdomen pelvis, 07/20/2023.  CTA chest, 05/17/2023. FINDINGS: CTA CHEST FINDINGS Cardiovascular: Ascending thoracic aorta measures 4 cm in diameter. No dissection. Minor atherosclerosis. No atherosclerotic ulcer. Aortic arch branch vessels are widely patent. Heart is normal in size. Three-vessel coronary artery calcifications. No pericardial effusion. Mediastinum/Nodes: No neck base, mediastinal or hilar masses. No enlarged lymph nodes. Multiple old calcified mediastinal and bilateral hilar nodes are noted, stable. Trachea esophagus are unremarkable. Lungs/Pleura: Patchy small confluent and ground-glass opacities no in the right middle and lower lobe, to a lesser degree in the left upper lobe lingula. Additional linear opacities noted in the left lower lobe at the lung base consistent with atelectasis. Linear opacity noted in the right lateral upper lobe, consistent with scarring or atelectasis. Compared to the prior CT angiogram of the chest from 05/17/2023, left lower lobe atelectasis is improved. Other patchy areas of opacity are new. No pleural effusion or pneumothorax. Musculoskeletal: No fracture or acute finding. No bone lesion. No chest wall mass. Review of the MIP images confirms the above findings. CTA ABDOMEN AND PELVIS FINDINGS VASCULAR Aorta: Minor atherosclerosis.  No aneurysm or dissection. Celiac: Patent without evidence of aneurysm, dissection, vasculitis or significant stenosis. SMA: Patent without evidence of aneurysm, dissection, vasculitis or significant stenosis. Renals: Both renal arteries are patent without evidence of  aneurysm, dissection, vasculitis, fibromuscular dysplasia or significant stenosis. IMA: Patent without evidence of aneurysm, dissection, vasculitis or significant stenosis. Inflow: Patent without evidence of aneurysm, dissection, vasculitis or significant stenosis. Veins: No obvious venous abnormality within the limitations of this arterial phase study. Review of the MIP images confirms the above findings. NON-VASCULAR Hepatobiliary: No focal liver abnormality is seen. Status post cholecystectomy. No biliary dilatation. Pancreas: Unremarkable. No pancreatic ductal dilatation or surrounding inflammatory changes. Spleen: Normal in size without focal abnormality. Adrenals/Urinary Tract: No adrenal masses. Bilateral renal cortical thinning. Stable nonobstructing stone, lower pole the left kidney. No renal masses. No other intrarenal stones. No hydronephrosis. Normal ureters.  Normal bladder. Stomach/Bowel: Stomach is within normal limits. Appendix appears normal. No evidence of bowel wall thickening, distention, or inflammatory changes. Lymphatic: No enlarged lymph nodes. Reproductive: Unremarkable. Other: No abdominal wall hernia or abnormality. No abdominopelvic ascites. Musculoskeletal: No fracture or acute finding. No bone lesion. Stable changes from a prior fusion, L3 through L5. Review of the MIP images confirms the above findings. IMPRESSION: 1. No evidence of acute aortic syndrome. No aortic aneurysm or dissection. 2. Patchy small confluent and ground-glass opacities in the right middle and lower lobe, to a lesser degree in the left upper lobe lingula. These are new since the prior CT angiogram of the chest from 05/17/2023 and are consistent with an infectious or inflammatory process. 3. Three-vessel coronary artery calcifications. 4. No acute findings within the abdomen or pelvis. Electronically Signed   By: Amie Portland M.D.   On: 08/05/2023 16:20   CT HEAD WO CONTRAST  Result Date: 08/05/2023 CLINICAL  DATA:  Headache, new onset (Age >= 51y) EXAM: CT HEAD WITHOUT CONTRAST TECHNIQUE: Contiguous axial images were obtained from the base of the skull through the vertex without intravenous contrast. RADIATION DOSE REDUCTION: This exam was performed according to the departmental dose-optimization program which includes automated exposure control, adjustment of the mA and/or kV according to patient size and/or use of iterative reconstruction technique. COMPARISON:  CT head June 23, 2022 FINDINGS: Brain: No evidence of acute infarction, hemorrhage, hydrocephalus, extra-axial collection or mass lesion/mass effect. Vascular: No hyperdense vessel.  Calcific atherosclerosis. Skull: No acute fracture. Sinuses/Orbits: Mild mucosal thickening.  No acute orbital findings. IMPRESSION: No evidence of acute intracranial abnormality. Electronically Signed   By: Feliberto Harts M.D.   On: 08/05/2023 15:56   DG Chest Portable 1 View  Result Date: 08/05/2023 CLINICAL DATA:  Chest pain and shortness of breath. EXAM: PORTABLE CHEST 1 VIEW COMPARISON:  05/17/2023. FINDINGS: Cardiac silhouette is mildly enlarged. No mediastinal or hilar masses. Linear and patchy opacity noted at the right lung base suspected to be atelectasis. Pneumonia not excluded. Mild linear opacity at the left lung base consistent with atelectasis. Remainder of the lungs is clear. No convincing pleural effusion.  No pneumothorax. Skeletal structures are grossly intact. IMPRESSION: 1. Right lung base opacity, likely atelectasis. Pneumonia not excluded. 2. Mild left lung base atelectasis. Electronically Signed   By: Amie Portland M.D.   On: 08/05/2023 14:35    Procedures Procedures    Medications Ordered in ED Medications  sodium chloride flush (NS) 0.9 % injection 10 mL (10 mLs Intravenous Given 08/06/23 1215)  lactated ringers bolus 500 mL (0 mLs Intravenous Stopped 08/06/23 1314)  doxycycline (VIBRA-TABS) tablet 100 mg (100 mg Oral Given 08/06/23  1214)  albuterol (PROVENTIL) (2.5 MG/3ML) 0.083% nebulizer solution 5 mg (5 mg Nebulization Given 08/06/23 1216)  cefTRIAXone (ROCEPHIN) 2 g in sodium chloride 0.9 % 100 mL IVPB (0 g Intravenous Stopped 08/06/23 1411)    ED Course/ Medical Decision Making/ A&P                                 Medical Decision Making Amount and/or Complexity of Data Reviewed Labs: ordered. Radiology: ordered. ECG/medicine tests: ordered.  Risk Prescription drug management. Decision regarding hospitalization.   Patient is a 67 year old male who was called back after he had a positive blood culture.  He has had some cough and hemoptysis.  Chest x-ray shows evidence of pneumonia.  This was interpreted by  me confirmed by the radiologist.  He is overall well-appearing.  His lactate is normal.  White count is normal.  He is not hypotensive.  His COVID/flu was negative.  He was started on IV antibiotics.  His EKG shows a prolonged QT interval so was treated with Rocephin and doxycycline to treat both his pneumonia and his possible staph bacteremia.  The susceptibility does not show indication of MRSA.  He did have some wheezing and was given a nebulizer treatment.  I consulted the hospitalist to admit the patient for further treatment.  Final Clinical Impression(s) / ED Diagnoses Final diagnoses:  Bacteremia  Community acquired pneumonia, unspecified laterality    Rx / DC Orders ED Discharge Orders     None         Rolan Bucco, MD 08/06/23 1423

## 2023-08-06 NOTE — H&P (Addendum)
History and Physical    [name] NUU:[id number] DOB: [birth year] DOA: 08/06/2023 DOS: the patient was seen and examined on 08/06/2023 PCP: Creola Corn, MD  Patient coming from: Home  Chief Complaint:  Chief Complaint  Patient presents with   Abnormal Lab   HPI: [name] is a 67 y.o. male with medical history significant of chronic HFpEF, hypertension, CAD, OSA intolerant to CPAP, hyperlipidemia, history of cardiac arrest (2002), history of PE (completed Xarelto therapy), history of CVA, sarcoidosis, stage III CKD, prior history of MSSA bacteremia (11/2020) presenting to the ED after having positive blood cultures for MSSA.  Patient reports that he was experiencing headaches, cough, congestion, chills starting this past Friday morning.  He also experienced some chest pain.  Wife checked his blood pressure and found that it was elevated and thus subsequently called EMS to bring him to the hospital yesterday.  EMS gave him aspirin and placed him on oxygen which relieved his chest pain.  Workup in the ED did not reveal any ACS.  CT angio chest abdomen pelvis did not reveal any PE but did show likely right middle lobe and right lower lobe pneumonia.  He was subsequently sent home with oral doxycycline.  Blood cultures drawn yesterday returned positive for MSSA today and patient received a call from ID advising him to return to the ED.  Today, he notes that his previous symptoms are somewhat better although they are still present.  He has no longer having a headache or chest pain.  He does continue to have cough productive of yellow/brown sputum although amount of sputum has improved since yesterday.  He does continue to endorse some congestion and chills.  Denies any fevers, shortness of breath, palpitations, chest pain, abdominal pain, urinary changes, weakness.  He denies any recent sick contacts although he was recently together with family for Thanksgiving.  ED course: CBC and  CMP fairly unremarkable, kidney function is around his baseline of CKD 3 (baseline EGFR 45-50).  Lactic acid negative x 2.  Chest x-ray revealed stable bronchopneumonia at lung bases.  Blood cultures redrawn today x 2, pending.  [facility] hospitalist asked to evaluate patient for admission.   Review of Systems: As mentioned in the history of present illness. All other systems reviewed and are negative. 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)    Past Surgical History:  Procedure Laterality Date   ABDOMINAL EXPLORATION SURGERY     BACK SURGERY     CHOLECYSTECTOMY     RIGHT/LEFT HEART CATH AND CORONARY ANGIOGRAPHY N/A 05/12/2021   Procedure: RIGHT/LEFT HEART CATH AND CORONARY ANGIOGRAPHY;  Surgeon:  Patwardhan, Anabel Bene, MD;  Location: [facility];  Service: Cardiovascular;  Laterality: N/A;   SHOULDER ARTHROSCOPY Right 10/30/2020   Procedure: ARTHROSCOPY SHOULDER WITH EXTENSIVE DEBRIDEMENT;  Surgeon: Kathryne Hitch, MD;  Location: [facility];  Service: Orthopedics;  Laterality: Right;   SHOULDER SURGERY     TRACHEOSTOMY     closed   Social History:  reports that he quit smoking about 2 years ago. His smoking use included cigarettes. He started smoking about 17 years  ago. He has a 3.9 pack-year smoking history. He has never used smokeless tobacco. He reports that he does not drink alcohol and does not use drugs.  Allergies  Allergen Reactions   Codeine Nausea And Vomiting   Hydrochlorothiazide Other (See Comments)    Unknown per Pt.    Hydrocodone-Acetaminophen Nausea And Vomiting   Hydromorphone Other (See Comments)    hallucinations     Family History  Problem Relation Age of Onset   Hypertension Father    CVA Father    Lung cancer Father    Alzheimer's disease Mother    Hypertension Sister    Heart failure Sister     Prior to Admission medications   Medication Sig Start Date End Date Taking? Authorizing Provider  aspirin EC 81 MG tablet Take 81 mg by mouth daily. Patients Wife gave him 4 tablets this am. 08/06/2023.   Yes [provider]  atorvastatin (LIPITOR) 20 MG tablet Take 20 mg by mouth daily.   Yes [provider]  dapagliflozin propanediol (FARXIGA) 10 MG TABS tablet Take 1 tablet (10 mg total) by mouth daily before breakfast. 07/04/23  Yes Tolia, Sunit, DO  diltiazem (CARDIZEM CD) 360 MG 24 hr capsule Take 1 capsule (360 mg total) by mouth daily. 11/17/22  Yes Custovic, Rozell Searing, DO  doxycycline (VIBRAMYCIN) 100 MG capsule Take 1 capsule (100 mg total) by mouth 2 (two) times daily. 08/05/23  Yes Terrilee Files, MD  hydrALAZINE (APRESOLINE) 100 MG tablet Take 100 mg by mouth 2 (two) times daily.   Yes [provider]  ipratropium-albuterol (DUONEB) 0.5-2.5 (3) MG/3ML SOLN Use 3 mLs by nebulization every 6 (six) hours for 20 days. 05/19/23 08/06/23 Yes Carollee Herter, DO  isosorbide mononitrate (IMDUR) 30 MG 24 hr tablet Take 3 tablets (90 mg total) by mouth daily. 09/22/22  Yes   metoprolol (TOPROL-XL) 200 MG 24 hr tablet Take 1 tablet (200 mg total) by mouth daily. 06/20/22  Yes   potassium chloride SA (KLOR-CON M) 20 MEQ tablet Take 1 tablet (20 mEq total) by mouth daily. 06/14/23  Yes Tolia, Sunit, DO   sacubitril-valsartan (ENTRESTO) 97-103 MG Take 1 tablet by mouth 2 (two) times daily. Patient taking differently: Take 1 tablet by mouth daily. 10/04/22  Yes Custovic, Rozell Searing, DO  tamsulosin (FLOMAX) 0.4 MG CAPS capsule Take 1 capsule by mouth daily 06/28/23  Yes   torsemide (DEMADEX) 20 MG tablet Take 1 tablet (20 mg total) by mouth daily. 06/14/23  Yes Tolia, Sunit, DO  traZODone (DESYREL) 150 MG tablet Take 1 tablet (150 mg total) by mouth at bedtime as needed for sleep. Patient taking differently: Take 150 mg by mouth at bedtime. 12/22/20  Yes Dohmeier, Porfirio Mylar, MD  triamcinolone cream (KENALOG) 0.1 % Apply to affected areas twice a day as needed for itching/inflammation. 09/09/21  Yes   VITAMIN D, CHOLECALCIFEROL, PO Take 1 capsule by mouth daily.   Yes [provider]    Physical Exam: Vitals:  08/06/23 1055 08/06/23 1056 08/06/23 1315 08/06/23 1444  BP:  (!) 147/91 (!) 155/97   Pulse:  66 66   Resp:  17 19   Temp:  98.2 F (36.8 C)  98.7 F (37.1 C)  TempSrc:    Oral  SpO2:  94% 96%   Weight: 74.8 kg     Height: 6' (1.829 m)      Physical Exam Constitutional:      Appearance: Normal appearance. He is not ill-appearing.  HENT:     Head: Normocephalic and atraumatic.     Nose: Congestion present.     Mouth/Throat:     Mouth: Mucous membranes are moist.     Pharynx: Oropharynx is clear. No oropharyngeal exudate.  Eyes:     General: No scleral icterus.    Extraocular Movements: Extraocular movements intact.     Conjunctiva/sclera: Conjunctivae normal.     Pupils: Pupils are equal, round, and reactive to light.  Cardiovascular:     Rate and Rhythm: Normal rate and regular rhythm.     Pulses: Normal pulses.     Heart sounds: Normal heart sounds. No murmur heard.    No friction rub. No gallop.     Comments: No appreciable JVD Pulmonary:     Effort: Pulmonary effort is normal. No respiratory distress.     Breath sounds: No rhonchi or rales.     Comments: Wheezing  noted bilaterally, more prominent at bases. Abdominal:     General: Bowel sounds are normal. There is no distension.     Palpations: Abdomen is soft.     Tenderness: There is no abdominal tenderness. There is no guarding or rebound.  Musculoskeletal:        General: Normal range of motion.     Cervical back: Normal range of motion.     Comments: 0-1+ edema in bilateral lower extremities up to lower shins.   Skin:    General: Skin is warm and dry.  Neurological:     General: No focal deficit present.     Mental Status: He is alert and oriented to person, place, and time.  Psychiatric:        Mood and Affect: Mood normal.        Behavior: Behavior normal.     Data Reviewed:  Results are pending, will review when available.     Latest Ref Rng & Units 08/06/2023   12:18 PM 08/05/2023    2:39 PM 08/05/2023    2:06 PM  CBC  WBC 4.0 - 10.5 K/uL 10.4     Hemoglobin 13.0 - 17.0 g/dL 16.1  09.6  04.5   Hematocrit 39.0 - 52.0 % 40.5  40.0  32.0   Platelets 150 - 400 K/uL 192         Latest Ref Rng & Units 08/06/2023   12:18 PM 08/05/2023    2:39 PM 08/05/2023    2:06 PM  CMP  Glucose 70 - 99 mg/dL 409  811    BUN 8 - 23 mg/dL 10  11    Creatinine 9.14 - 1.24 mg/dL 7.82  9.56    Sodium 213 - 145 mmol/L 140  144  145   Potassium 3.5 - 5.1 mmol/L 3.9  3.3  2.7   Chloride 98 - 111 mmol/L 109  106    CO2 22 - 32 mmol/L 25     Calcium 8.9 - 10.3 mg/dL 9.1     Total Protein 6.5 - 8.1 g/dL 6.6  Total Bilirubin <1.2 mg/dL 0.8     Alkaline Phos 38 - 126 U/L 65     AST 15 - 41 U/L 17     ALT 0 - 44 U/L 12      Lactic Acid, Venous    Component Value Date/Time   LATICACIDVEN 0.6 08/06/2023 1342   Blood cultures -- 2/4 bottles growing MSSA (08/05/2023)   Assessment and Plan: No notes have been filed under this hospital service. Service: Hospitalist  CAP Having URI symptoms since yesterday, including productive cough, congestion, chills. Afebrile and HDS. CT chest/abd/pel  yesterday showing GGO in RML and RLL. CXR showing bronchopneumonia at bases. His symptoms are improving though still has cough and congestion. Oxygen saturation is >95% on RA. Will switch from rocephin to cefazolin for better MSSA coverage, discussed with ID.  -treating with cefazolin, doxy (prolonged QT interval) -f/u strep pneumonia urinary Ag, respiratory viral panel, expectorated sputum w/gram stain -f/u repeat blood cultures -mucinex-DM BID to help suppress cough -supplemental O2 if needed to maintain O2 sats >92% -admit to inpatient, telemetry -trend CBC  MSSA bacteremia  -Positive blood cultures - MSSA (2/4 bottles) -Hx of MSSA bacteremia (11/2020 during hospitalization for rotator cuff surgery) -f/u TTE -MRI thoracic and lumbar spine per ID, will obtain without contrast given kidney function -treating with cefazolin -trend CBC -ID consulted, Dr. Thedore Mins  COPD Sarcoidosis Patient does not follow pulmonology.  He is on DuoNebs at home.  Received albuterol nebulizer here today.  He is breathing comfortably and oxygen saturations are greater than 95% on room air.  He does have bilateral wheezing on exam, probable worsening from CAP, and thus will continue scheduled DuoNebs at this time.  Does not appear to be in COPD exacerbation.  -duonebs every 6 hours  Chronic HFpEF Stage C, NYHA II/III. [facility] in the outpatient setting. Has mild (0-1+) edema in bilateral lower extremities up to lower shins. Last ECHO with LVEF 55-60% with G1DD. -continue home torsemide 20mg  daily, entresto 97-103mg  BID, imdur 90mg  daily, toprol-xl 200mg  daily, farxiga 10mg  daily -follows [facility] -trend I/O's  HTN CAD -BP elevated in ED (140s-150s/90s) -continue home imdur, hydralazine 100mg  BID, torsemide, farxiga, toprol-xl, entresto  Prolonged QT interval -QT 560 / Qtc 592 -avoid QT prolonging medications as able  Stage 3 CKD -baseline eGFR 45-50 -on admission, Cr 1.63  with eGFR 46 -trend kidney function, urine output -avoid nephrotoxic agents as able  OSA -intolerant to CPAP  HLD -continue lipitor  Hx of cardiac arrest -in 2002 during hospitalization for pancreatitis per wife  Hx of PE -completed xarelto for 1 year  Hx of CVA Patient reports no history of stroke but is listed in medical history. -continue ASA and lipitor     Advance Care Planning:   Code Status: Full Code Confirmed with patient and wife at bedside.  Consults: Infectious disease  Family Communication: updated wife at bedside  Severity of Illness: The appropriate patient status for this patient is INPATIENT. Inpatient status is judged to be reasonable and necessary in order to provide the required intensity of service to ensure the patient's safety. The patient's presenting symptoms, physical exam findings, and initial radiographic and laboratory data in the context of their chronic comorbidities is felt to place them at high risk for further clinical deterioration. Furthermore, it is not anticipated that the patient will be medically stable for discharge from the hospital within 2 midnights of admission.   * I certify that at the point of admission  it is my clinical judgment that the patient will require inpatient hospital care spanning beyond 2 midnights from the point of admission due to high intensity of service, high risk for further deterioration and high frequency of surveillance required.*  Portions of this note were generated with Dragon dictation software. Dictation errors may occur despite best attempts at proofreading.   Author: Briscoe Burns, MD 08/06/2023 3:05 PM  For on call review [email or web address].

## 2023-08-06 NOTE — Progress Notes (Signed)
   08/06/23 2032  Respiratory Severity Assessment  $ Protocol Assessment  Yes  Heart Rate 0  Breath Sounds 0  Respiratory Pattern 0  Cough 1  Chest X Ray 1  O2 Requirements/ Pulse Ox Sat(%) 0  Mental Status 0  Dyspnea 1  Score Total 3  Aerosolized Bronchodilators  Aerosolized bronchodilator indications Aerosolized bronchodilator indicated  Medication Plan of Care Hand held neb treatment  Respiratory Therapy Follow Up Assessment  Assessment follow up date 08/07/23   Respiratory assessment completed. Patient has clear BBS, stable PNA on CXR. Home\ meds patient was taking Duoneb Q6 for 20 days since September but that has expired. Pt scored a 3 on assessment. 96% on RA. Will change nebs to TID.  [name]

## 2023-08-06 NOTE — Telephone Encounter (Signed)
Blood Cx+ MSSA. Spoke with wife and informed of positive blood Cx and need for IV abx. Wife states she will bring pt to ED right away -Start cefazolin -TTE

## 2023-08-06 NOTE — Consult Note (Signed)
[facility]    Date of Admission:  08/06/2023   Total days of inpatient antibiotics 0        Reason for Consult: MSSA bacteremia    Principal Problem:   Bacteremia due to Staphylococcus aureus Active Problems:   Essential hypertension   OSA (obstructive sleep apnea)   (HFpEF) heart failure with preserved ejection fraction (HCC)   Coronary artery disease involving native coronary artery of native heart without angina pectoris   HLD (hyperlipidemia)   History of pulmonary embolism   Assessment: 67 year old male with history MSSA bacteremia 2022 treated with cefazolin etiology unclear but in the setting of recent rotator cuff surgery, hypertension, CHF, OSA not on CPAP, PE on Xarelto admitted with: #MSSA bacteremia secondary to pneumonia. #virtual(CT colonoscopy on 11/13 for polyps #Low back pain -Patient presented to the ED day prior with history of chest pain for which EMS was called he had also had severe headaches, body aches.  CTA chest/A/P showed patchy small confluent and groundglass opacity in the right middle and lower lobe concerning for infectious etiology.  He was discharged doxycycline - Call back to the ED due to blood cultures 1/2 sets growing MSSA. - Patient reports some lower back pain.  He did not report a cough/congestion to me but he was pretty fatigued during the visit. Recommendations:  -Repat blood Cx obtained -TTE pending -Continue cefazolin -MRI T and L -D/C ceftriaxone -cefazolin + doxy(possible CAP) - Respiratory cultures Microbiology:   Antibiotics: CTX and doxy   Cultures: Blood 11/29 MSSA Urine  Other   HPI: [name] is a 67 y.o. male with history of MSSA bacteremia in 2022 treated with cefazolin x 4 weeks(TTE negative), in the setting of recent rotator cuff surgery, hypertension, CHF, OSA cardiac arrest in 2002, PVD on Xarelto, CVA, CKD stage III, lumbar fusion initially presented the ED with  headaches, cough, congestion, chills during past Friday.  He also having chest pain EMS was called patient presented to the ED.  CT a chest/A/P showed patchy small confluent and groundglass opacity in the right middle and lower lobe concerning for infectious etiology.  Discharged on Doxy.  Call back next day as blood cultures came back positive for MSSA   Review of Systems: Review of Systems  All other systems reviewed and are negative.   Past Medical History:  Diagnosis Date   Back pain    CHF (congestive heart failure) (HCC)    CKD (chronic kidney disease), stage III (HCC)    Closed nondisplaced fracture of first right metatarsal bone 06/23/2022   CVA (cerebral infarction)    Diastolic dysfunction    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   ED (erectile dysfunction)    GERD (gastroesophageal reflux disease)    Hypercalcemia    Hyperlipidemia    Hypertension    Insomnia    Long-term use of high-risk medication    Nephrocalcinosis    Nephrolithiasis    Obesity    Osteopenia    Pancreatitis    admitted 03/19/01-06/05/01   PE (pulmonary embolism) 01/30/2013   Proteinuria    Sarcoidosis    Sleep apnea    Smoker    Stroke [facility])     Social History   Tobacco Use   Smoking status: Former    Current packs/day: 0.00    Average packs/day: 0.3 packs/day for 15.5 years (3.9 ttl pk-yrs)  Types: Cigarettes    Start date: 09/06/2005    Quit date: 02/23/2021    Years since quitting: 2.4   Smokeless tobacco: Never  Vaping Use   Vaping status: Never Used  Substance Use Topics   Alcohol use: No    Alcohol/week: 0.0 standard drinks of alcohol   Drug use: No    Family History  Problem Relation Age of Onset   Hypertension Father    CVA Father    Lung cancer Father    Alzheimer's disease Mother    Hypertension Sister    Heart failure Sister    Scheduled Meds:  [START ON 08/07/2023] aspirin EC  81 mg Oral Daily   [START ON 08/07/2023]  atorvastatin  20 mg Oral Daily   [START ON 08/07/2023] dapagliflozin propanediol  10 mg Oral QAC breakfast   dextromethorphan-guaiFENesin  1 tablet Oral BID   [START ON 08/07/2023] diltiazem  360 mg Oral Daily   enoxaparin (LOVENOX) injection  40 mg Subcutaneous Q24H   hydrALAZINE  100 mg Oral BID   ipratropium-albuterol  3 mL Nebulization Q6H   [START ON 08/07/2023] isosorbide mononitrate  90 mg Oral Daily   [START ON 08/07/2023] metoprolol  200 mg Oral Daily   [START ON 08/07/2023] sacubitril-valsartan  1 tablet Oral Daily   sodium chloride flush  10 mL Intravenous Q12H   [START ON 08/07/2023] tamsulosin  0.4 mg Oral QPC breakfast   torsemide  20 mg Oral Daily   Continuous Infusions: PRN Meds:.acetaminophen **OR** acetaminophen, polyethylene glycol, traZODone Allergies  Allergen Reactions   Codeine Nausea And Vomiting   Hydrochlorothiazide Other (See Comments)    Unknown per Pt.    Hydrocodone-Acetaminophen Nausea And Vomiting   Hydromorphone Other (See Comments)    hallucinations     OBJECTIVE: Blood pressure (!) 155/97, pulse 66, temperature 98.7 F (37.1 C), temperature source Oral, resp. rate 19, height 6' (1.829 m), weight 74.8 kg, SpO2 96%.  Physical Exam Constitutional:      General: He is not in acute distress.    Appearance: He is normal weight. He is not toxic-appearing.  HENT:     Head: Normocephalic and atraumatic.     Right Ear: External ear normal.     Left Ear: External ear normal.     Nose: No congestion or rhinorrhea.     Mouth/Throat:     Mouth: Mucous membranes are moist.     Pharynx: Oropharynx is clear.  Eyes:     Extraocular Movements: Extraocular movements intact.     Conjunctiva/sclera: Conjunctivae normal.     Pupils: Pupils are equal, round, and reactive to light.  Cardiovascular:     Rate and Rhythm: Normal rate and regular rhythm.     Heart sounds: No murmur heard.    No friction rub. No gallop.  Pulmonary:     Effort: Pulmonary effort is  normal.     Breath sounds: Normal breath sounds.  Abdominal:     General: Abdomen is flat. Bowel sounds are normal.     Palpations: Abdomen is soft.  Musculoskeletal:        General: No swelling. Normal range of motion.     Cervical back: Normal range of motion and neck supple.  Skin:    General: Skin is warm and dry.  Neurological:     General: No focal deficit present.     Mental Status: He is oriented to person, place, and time.  Psychiatric:        Mood and  Affect: Mood normal.     Lab Results Lab Results  Component Value Date   WBC 10.4 08/06/2023   HGB 13.7 08/06/2023   HCT 40.5 08/06/2023   MCV 92.3 08/06/2023   PLT 192 08/06/2023    Lab Results  Component Value Date   CREATININE 1.63 (H) 08/06/2023   BUN 10 08/06/2023   NA 140 08/06/2023   K 3.9 08/06/2023   CL 109 08/06/2023   CO2 25 08/06/2023    Lab Results  Component Value Date   ALT 12 08/06/2023   AST 17 08/06/2023   ALKPHOS 65 08/06/2023   BILITOT 0.8 08/06/2023       Danelle Earthly, MD [facility] 08/06/2023, 3:21 PM I have personally spent 82 minutes involved in face-to-face and non-face-to-face activities for this patient on the day of the visit. Professional time spent includes the following activities: Preparing to see the patient (review of tests), Obtaining and/or reviewing separately obtained history (admission/discharge record), Performing a medically appropriate examination and/or evaluation , Ordering medications/tests/procedures, referring and communicating with other health care professionals, Documenting clinical information in the EMR, Independently interpreting results (not separately reported), Communicating results to the patient/family/caregiver, Counseling and educating the patient/family/caregiver and Care coordination (not separately reported).

## 2023-08-06 NOTE — ED Triage Notes (Signed)
Patient was here yesterday for chest pain and dizziness and was called back this am due to +blood culture and needs to started on IV antibiotics.  Patient has been coughing up blood this am and complains of a headache.  Wife reports BP was SBP190s this morning and patient was confused.

## 2023-08-06 NOTE — ED Notes (Signed)
Phlebotomy to collect second set of blood cultures

## 2023-08-07 ENCOUNTER — Inpatient Hospital Stay (HOSPITAL_COMMUNITY): Payer: PPO

## 2023-08-07 DIAGNOSIS — J189 Pneumonia, unspecified organism: Secondary | ICD-10-CM

## 2023-08-07 DIAGNOSIS — R7881 Bacteremia: Secondary | ICD-10-CM | POA: Diagnosis not present

## 2023-08-07 DIAGNOSIS — I5032 Chronic diastolic (congestive) heart failure: Secondary | ICD-10-CM | POA: Diagnosis not present

## 2023-08-07 DIAGNOSIS — B9561 Methicillin susceptible Staphylococcus aureus infection as the cause of diseases classified elsewhere: Secondary | ICD-10-CM | POA: Diagnosis not present

## 2023-08-07 LAB — PHOSPHORUS: Phosphorus: 2.6 mg/dL (ref 2.5–4.6)

## 2023-08-07 LAB — ECHOCARDIOGRAM COMPLETE
AR max vel: 3.03 cm2
AV Area VTI: 3.04 cm2
AV Area mean vel: 3 cm2
AV Mean grad: 5 mm[Hg]
AV Peak grad: 11.2 mm[Hg]
Ao pk vel: 1.67 m/s
Area-P 1/2: 2.82 cm2
Height: 72 in
S' Lateral: 3.3 cm
Weight: 3432.12 [oz_av]

## 2023-08-07 LAB — RESPIRATORY PANEL BY PCR

## 2023-08-07 LAB — CBC
HCT: 38.7 % — ABNORMAL LOW (ref 39.0–52.0)
Hemoglobin: 12.9 g/dL — ABNORMAL LOW (ref 13.0–17.0)
MCH: 30.7 pg (ref 26.0–34.0)
MCHC: 33.3 g/dL (ref 30.0–36.0)
MCV: 92.1 fL (ref 80.0–100.0)
Platelets: 176 10*3/uL (ref 150–400)
RBC: 4.2 MIL/uL — ABNORMAL LOW (ref 4.22–5.81)
RDW: 14.7 % (ref 11.5–15.5)
WBC: 6.9 10*3/uL (ref 4.0–10.5)
nRBC: 0 % (ref 0.0–0.2)

## 2023-08-07 LAB — BASIC METABOLIC PANEL
Anion gap: 6 (ref 5–15)
BUN: 9 mg/dL (ref 8–23)
CO2: 26 mmol/L (ref 22–32)
Calcium: 8.9 mg/dL (ref 8.9–10.3)
Chloride: 108 mmol/L (ref 98–111)
Creatinine, Ser: 1.63 mg/dL — ABNORMAL HIGH (ref 0.61–1.24)
GFR, Estimated: 46 mL/min — ABNORMAL LOW (ref >60)
Glucose, Bld: 98 mg/dL (ref 70–99)
Potassium: 3.4 mmol/L — ABNORMAL LOW (ref 3.5–5.1)
Sodium: 140 mmol/L (ref 135–145)

## 2023-08-07 LAB — GLUCOSE, CAPILLARY: Glucose-Capillary: 107 mg/dL — ABNORMAL HIGH (ref 70–99)

## 2023-08-07 LAB — MAGNESIUM: Magnesium: 2.1 mg/dL (ref 1.7–2.4)

## 2023-08-07 NOTE — Progress Notes (Signed)
Echocardiogram 2D Echocardiogram has been performed.  Jenalyn Girdner N Braxtyn Dorff,RDCS 08/07/2023, 8:46 AM

## 2023-08-07 NOTE — Progress Notes (Signed)
PROGRESS NOTE  [name]  DOB: 05/08/1956  PCP: Manuel Corn, MD VQQ:[id number]  DOA: Hall/30/2024  LOS: 1 day  Hospital Day: 2  Brief narrative: [name] is a 67 y.o. male with PMH significant for Hall, [name], [name], [name], [name], [name], [name], [name], [name], [name], [name]/29, patient presented to the ED with complaint of headache, cough, congestion, chills.  CT chest abdomen pelvis showed patchy small confluent and groundglass opacity in the right middle and lower lobe concerning for infection.  He was discharged on doxycycline.  Blood cultures sent on that presentation returned positive for MSSA Hall/30, patient was called back to the ED for IV antibiotics  In the ED, patient was hemodynamically stable CBC, CMP unremarkable except for creatinine at baseline Lactic acid not elevated. Blood culture was sent again Admitted to [facility] MRI thoracic and lumbar spine was done which did not show any evidence of acute infection  Subjective: Patient was seen and examined this morning.  Pleasant elderly male.  Lying on bed.  Not in distress.  Feels good. Chart reviewed In the last 24 hours, no fever, blood pressure elevated to 150s, breathing on room air  Assessment and plan: MSSA bacteremia Community-acquired pneumonia Blood culture sent again.   ID following  Currently on cefazolin and doxycycline Echo did not show any evidence of vegetation. MRI thoracic and lumbar did not show any evidence of active infection. Currently on Mucinex twice daily for cough Currently not on supplemental oxygen.  Monitor  Hypokalemia/hypomagnesemia Potassium and magnesium levels were low as below.  Replacement given. Potassium further low at 3.4 today.  Replacement ordered.  Normal magnesium and phosphorus level. Recent Labs  Lab Hall/29/24 1400 Hall/29/24 1406  Hall/29/24 1439 Hall/30/24 1218 08/07/23 0523 08/07/23 0916  K 2.7* 2.7* 3.3* 3.9 3.4*  --   MG 1.6*  --   --   --   --  2.1  PHOS  --   --   --   --   --  2.6   Prolonged QT interval QT 560 / Qtc 592.  Likely due to low electrolytes avoid QT prolonging medications as able Replace and monitor electrolytes Repeat EKG today  COPD [name] Continue DuoNeb every 6 hours   Chronic HFpEF Hypertension Bilateral pedal edema  Last ECHO with LVEF 55-60% with G1DD. Follows up with [facility] cardiology PTA meds- Toprol XL 200 mg daily, Cardizem 360 mg daily, torsemide 20mg  daily, entresto 97-103mg  BID, imdur 90mg  daily, farxiga 10mg  daily, hydralazine 100 mg twice daily Currently continued on all.  Continue to monitor blood pressure   [name]/[name] [name] Continue aspirin, statin   [name] Aspirin and statin  Manuel PE  completed Hall  [name] 3a Creatinine at baseline Recent Labs    05/17/23 0801 09/Hall/24 0828 05/19/23 0625 Hall/29/24 1400 Hall/29/24 1439 Hall/30/24 1218 08/07/23 0523  BUN 17 22 23 10 Hall 10 9   CREATININE 1.54* 1.80* 1.90* 1.33* 1.50* 1.63* 1.63*   Manuel  intolerant to Hall  BPH Flomax 0.4 mg daily  Insomnia Trazodone 150 mg bedtime as needed    Mobility: Encourage ambulation.  Goals of care   Code Status: Full Code     DVT prophylaxis:  enoxaparin (LOVENOX) injection 40 mg Start: Hall/30/24 1700   Antimicrobials: IV Ancef and doxycycline Fluid: None Consultants: ID Family Communication: None at bedside  Status: Inpatient Level of care:  Telemetry Medical   Patient is from: Home Needs to continue in-hospital care: On IV antibiotics, pending workup Anticipated d/c to: Pending clinical course   Diet:  Diet Order             Diet Heart Room service appropriate? Yes; Fluid consistency: Thin  Diet effective now                   Scheduled Meds:  aspirin EC  81 mg Oral Daily   atorvastatin  20 mg Oral Daily    dapagliflozin propanediol  10 mg Oral QAC breakfast   dextromethorphan-guaiFENesin  1 tablet Oral BID   diltiazem  360 mg Oral Daily   doxycycline  100 mg Oral Q12H   enoxaparin (LOVENOX) injection  40 mg Subcutaneous Q24H   hydrALAZINE  100 mg Oral BID   ipratropium-albuterol  3 mL Nebulization TID   isosorbide mononitrate  90 mg Oral Daily   metoprolol  200 mg Oral Daily   sacubitril-valsartan  1 tablet Oral BID   sodium chloride flush  10 mL Intravenous Q12H   tamsulosin  0.4 mg Oral QPC breakfast   torsemide  20 mg Oral Daily    PRN meds: acetaminophen **OR** acetaminophen, polyethylene glycol, traZODone   Infusions:    ceFAZolin (ANCEF) IV 2 g (08/07/23 1328)    Antimicrobials: Anti-infectives (From admission, onward)    Start     Dose/Rate Route Frequency Ordered Stop   08/07/23 1400  cefTRIAXone (ROCEPHIN) 1 g in sodium chloride 0.9 % 100 mL IVPB  Status:  Discontinued        1 g 200 mL/hr over 30 Minutes Intravenous Every 24 hours Hall/30/24 1534 Hall/30/24 1741   08/07/23 1400  ceFAZolin (ANCEF) IVPB 2g/100 mL premix        2 g 200 mL/hr over 30 Minutes Intravenous Every 8 hours Hall/30/24 1741     Hall/30/24 2200  doxycycline (VIBRA-TABS) tablet 100 mg        100 mg Oral Every 12 hours Hall/30/24 1534 08/10/23 2159   Hall/30/24 1315  cefTRIAXone (ROCEPHIN) 1 g in sodium chloride 0.9 % 100 mL IVPB  Status:  Discontinued        1 g 200 mL/hr over 30 Minutes Intravenous  Once Hall/30/24 1311 Hall/30/24 1312   Hall/30/24 1315  cefTRIAXone (ROCEPHIN) 2 g in sodium chloride 0.9 % 100 mL IVPB        2 g 200 mL/hr over 30 Minutes Intravenous  Once Hall/30/24 1312 Hall/30/24 1411   Hall/30/24 1145  cefTRIAXone (ROCEPHIN) 2 g in sodium chloride 0.9 % 100 mL IVPB  Status:  Discontinued        2 g 200 mL/hr over 30 Minutes Intravenous Every 24 hours Hall/30/24 1141 Hall/30/24 1143   Hall/30/24 1145  doxycycline (VIBRA-TABS) tablet 100 mg        100 mg Oral  Once Hall/30/24 1143 Hall/30/24 1214        Objective: Vitals:   08/07/23 1150 08/07/23 1219  BP: 139/88 127/83  Pulse: 64 68  Resp: 16 17  Temp: 98.7 F (37.1 C) 98.6 F (37 C)  SpO2: 93% 95%   No intake or output data in the 24 hours ending 08/07/23 1413  Filed Weights   Hall/30/24 1055 Hall/30/24 1908 08/07/23 0500  Weight: 74.8 kg 100.2 kg 97.3 kg   Weight change:  Body mass index is 29.09 kg/m.   Physical Exam: General exam: Pleasant, elderly.  Not in distress Skin: No rashes, lesions or ulcers. HEENT: Atraumatic, normocephalic, no obvious bleeding Lungs: Clear to auscultation bilaterally CVS: Regular rate and rhythm, no murmur GI/Abd soft, nontender, nondistended, bowel sound present CNS: Alert, awake, oriented x 3 Psychiatry: Mood appropriate Extremities: No pedal edema, no calf tenderness  Data Review: I have personally reviewed the laboratory data and studies available.  F/u labs ordered Unresulted Labs (From admission, onward)     Start     Ordered   08/13/23 0500  Creatinine, serum  (enoxaparin (LOVENOX)    CrCl >/= 30 ml/min)  Weekly,   R     Comments: while on enoxaparin therapy    Hall/30/24 1501   08/07/23 0736  Culture, Respiratory w Gram Stain  Once,   R        08/07/23 0736   Hall/30/24 1456  Expectorated Sputum Assessment w Gram Stain, Rflx to Resp Cult  (COPD / Pneumonia / Cellulitis / Lower Extremity Wound)  Once,   R        Hall/30/24 1501            Total time spent in review of labs and imaging, patient evaluation, formulation of plan, documentation and communication with family: 45 minutes  Signed, Lorin Glass, MD [facility] 08/07/2023

## 2023-08-07 NOTE — Plan of Care (Signed)
  Problem: Education: Goal: Knowledge of General Education information will improve Description: Including pain rating scale, medication(s)/side effects and non-pharmacologic comfort measures Outcome: Progressing   Problem: Health Behavior/Discharge Planning: Goal: Ability to manage health-related needs will improve Outcome: Progressing   Problem: Clinical Measurements: Goal: Ability to maintain clinical measurements within normal limits will improve Outcome: Progressing Goal: Will remain free from infection Outcome: Progressing Goal: Diagnostic test results will improve Outcome: Progressing Goal: Respiratory complications will improve Outcome: Progressing Goal: Cardiovascular complication will be avoided Outcome: Progressing   Problem: Activity: Goal: Risk for activity intolerance will decrease Outcome: Progressing   Problem: Nutrition: Goal: Adequate nutrition will be maintained Outcome: Progressing   Problem: Coping: Goal: Level of anxiety will decrease Outcome: Progressing   Problem: Elimination: Goal: Will not experience complications related to bowel motility Outcome: Progressing Goal: Will not experience complications related to urinary retention Outcome: Progressing   Problem: Pain Management: Goal: General experience of comfort will improve Outcome: Progressing   Problem: Safety: Goal: Ability to remain free from injury will improve Outcome: Progressing   Problem: Skin Integrity: Goal: Risk for impaired skin integrity will decrease Outcome: Progressing   Problem: Education: Goal: Ability to demonstrate management of disease process will improve Outcome: Progressing Goal: Ability to verbalize understanding of medication therapies will improve Outcome: Progressing Goal: Individualized Educational Video(s) Outcome: Progressing   Problem: Activity: Goal: Capacity to carry out activities will improve Outcome: Progressing   Problem: Cardiac: Goal:  Ability to achieve and maintain adequate cardiopulmonary perfusion will improve Outcome: Progressing   Problem: Education: Goal: Knowledge of disease or condition will improve Outcome: Progressing Goal: Knowledge of the prescribed therapeutic regimen will improve Outcome: Progressing Goal: Individualized Educational Video(s) Outcome: Progressing   Problem: Activity: Goal: Ability to tolerate increased activity will improve Outcome: Progressing Goal: Will verbalize the importance of balancing activity with adequate rest periods Outcome: Progressing   Problem: Respiratory: Goal: Ability to maintain a clear airway will improve Outcome: Progressing Goal: Levels of oxygenation will improve Outcome: Progressing Goal: Ability to maintain adequate ventilation will improve Outcome: Progressing   Problem: Activity: Goal: Ability to tolerate increased activity will improve Outcome: Progressing   Problem: Clinical Measurements: Goal: Ability to maintain a body temperature in the normal range will improve Outcome: Progressing   Problem: Respiratory: Goal: Ability to maintain adequate ventilation will improve Outcome: Progressing Goal: Ability to maintain a clear airway will improve Outcome: Progressing

## 2023-08-07 NOTE — Progress Notes (Signed)
[facility]  Date of Admission:  08/06/2023   Total days of inpatient antibiotics 2  Principal Problem:   Bacteremia due to Staphylococcus aureus Active Problems:   Essential hypertension   OSA (obstructive sleep apnea)   (HFpEF) heart failure with preserved ejection fraction (HCC)   Coronary artery disease involving native coronary artery of native heart without angina pectoris   HLD (hyperlipidemia)   History of pulmonary embolism   Community acquired pneumonia          Assessment: 67 year old male with history MSSA bacteremia 2022 treated with cefazolin etiology unclear but in the setting of recent rotator cuff surgery, hypertension, CHF, OSA not on CPAP, PE on Xarelto admitted with: #MSSA bacteremia secondary to pneumonia. #Virtual(CT colonoscopy on 11/13 for polyps #Low back pain -Patient presented to the ED day prior with history of chest pain for which EMS was called he had also had severe headaches, body aches.  CTA chest/A/P showed patchy small confluent and groundglass opacity in the right middle and lower lobe concerning for infectious etiology.  He was discharged doxycycline - Call back to the ED due to blood cultures 1/2 sets growing MSSA. - Patient reports some lower back pain.   -MRI T and L no concern for infection -TTE without vegetation, given this is the second episode in 2 years. Will get TEE Recommendations:  -D/C doxy -Follow repeat blood Cx to ensure clearance -TEE -Continue cefazolin. Today spoke with pt and he was much more laert. States he had a mild cough with congestion x 1week. Suspect bacteremia from MSSA PNA.  - Respiratory cultures  New ID team on Monday   Microbiology:   Antibiotics: Doxy 11/29- Cefazolin 12/1- Ctx 11/30 Cultures: Blood 11/29 1/2 MSSA  11/30 NG  Other RVP negative  SUBJECTIVE: Resting in bed. Afebrile overnight. Interval: Afebrile overnight  Review of Systems: Review of  Systems  All other systems reviewed and are negative.    Scheduled Meds:  aspirin EC  81 mg Oral Daily   atorvastatin  20 mg Oral Daily   dapagliflozin propanediol  10 mg Oral QAC breakfast   dextromethorphan-guaiFENesin  1 tablet Oral BID   diltiazem  360 mg Oral Daily   doxycycline  100 mg Oral Q12H   enoxaparin (LOVENOX) injection  40 mg Subcutaneous Q24H   hydrALAZINE  100 mg Oral BID   isosorbide mononitrate  90 mg Oral Daily   metoprolol  200 mg Oral Daily   sacubitril-valsartan  1 tablet Oral BID   sodium chloride flush  10 mL Intravenous Q12H   tamsulosin  0.4 mg Oral QPC breakfast   torsemide  20 mg Oral Daily   Continuous Infusions:   ceFAZolin (ANCEF) IV Stopped (08/07/23 1359)   PRN Meds:.acetaminophen **OR** acetaminophen, polyethylene glycol, traZODone Allergies  Allergen Reactions   Codeine Nausea And Vomiting   Hydrochlorothiazide Other (See Comments)    Unknown per Pt.    Hydrocodone-Acetaminophen Nausea And Vomiting   Hydromorphone Other (See Comments)    hallucinations     OBJECTIVE: Vitals:   08/07/23 0908 08/07/23 1150 08/07/23 1219 08/07/23 1612  BP: (!) 164/104 139/88 127/83 (!) 148/108  Pulse: 72 64 68 70  Resp: 16 16 17 16   Temp: 99 F (37.2 C) 98.7 F (37.1 C) 98.6 F (37 C) 98.7 F (37.1 C)  TempSrc: Oral Oral Oral Oral  SpO2: 97% 93% 95% 95%  Weight:      Height:  Body mass index is 29.09 kg/m.  Physical Exam Constitutional:      General: He is not in acute distress.    Appearance: He is normal weight. He is not toxic-appearing.  HENT:     Head: Normocephalic and atraumatic.     Right Ear: External ear normal.     Left Ear: External ear normal.     Nose: No congestion or rhinorrhea.     Mouth/Throat:     Mouth: Mucous membranes are moist.     Pharynx: Oropharynx is clear.  Eyes:     Extraocular Movements: Extraocular movements intact.     Conjunctiva/sclera: Conjunctivae normal.     Pupils: Pupils are equal, round,  and reactive to light.  Cardiovascular:     Rate and Rhythm: Normal rate and regular rhythm.     Heart sounds: No murmur heard.    No friction rub. No gallop.  Pulmonary:     Effort: Pulmonary effort is normal.     Breath sounds: Normal breath sounds.  Abdominal:     General: Abdomen is flat. Bowel sounds are normal.     Palpations: Abdomen is soft.  Musculoskeletal:        General: No swelling. Normal range of motion.     Cervical back: Normal range of motion and neck supple.  Skin:    General: Skin is warm and dry.  Neurological:     General: No focal deficit present.     Mental Status: He is oriented to person, place, and time.  Psychiatric:        Mood and Affect: Mood normal.       Lab Results Lab Results  Component Value Date   WBC 6.9 08/07/2023   HGB 12.9 (L) 08/07/2023   HCT 38.7 (L) 08/07/2023   MCV 92.1 08/07/2023   PLT 176 08/07/2023    Lab Results  Component Value Date   CREATININE 1.63 (H) 08/07/2023   BUN 9 08/07/2023   NA 140 08/07/2023   K 3.4 (L) 08/07/2023   CL 108 08/07/2023   CO2 26 08/07/2023    Lab Results  Component Value Date   ALT 12 08/06/2023   AST 17 08/06/2023   ALKPHOS 65 08/06/2023   BILITOT 0.8 08/06/2023        Danelle Earthly, MD [facility] 08/07/2023, 6:17 PM I have personally spent 52 minutes involved in face-to-face and non-face-to-face activities for this patient on the day of the visit. Professional time spent includes the following activities: Preparing to see the patient (review of tests), Obtaining and/or reviewing separately obtained history (admission/discharge record), Performing a medically appropriate examination and/or evaluation , Ordering medications/tests/procedures, referring and communicating with other health care professionals, Documenting clinical information in the EMR, Independently interpreting results (not separately reported), Communicating  results to the patient/family/caregiver, Counseling and educating the patient/family/caregiver and Care coordination (not separately reported).

## 2023-08-08 ENCOUNTER — Inpatient Hospital Stay (HOSPITAL_COMMUNITY): Payer: PPO

## 2023-08-08 DIAGNOSIS — R7881 Bacteremia: Secondary | ICD-10-CM | POA: Diagnosis not present

## 2023-08-08 DIAGNOSIS — B9561 Methicillin susceptible Staphylococcus aureus infection as the cause of diseases classified elsewhere: Secondary | ICD-10-CM | POA: Diagnosis not present

## 2023-08-08 LAB — GLUCOSE, CAPILLARY: Glucose-Capillary: 129 mg/dL — ABNORMAL HIGH (ref 70–99)

## 2023-08-08 NOTE — Progress Notes (Signed)
Patient refused vitals at this time.

## 2023-08-08 NOTE — Progress Notes (Signed)
PROGRESS NOTE  [name]  DOB: [birth year]  PCP: Creola Corn, MD EAV:[id number]  DOA: 08/06/2023  LOS: 2 days  Hospital Day: 3  Brief narrative: [name] is a 67 y.o. male with PMH significant for HTN, HLD, OSA intolerant to CPAP, CAD, diastolic CHF, h/o cardiac arrest 2002, h/o CVA, CKD, sarcoidosis, h/o PE completed Xarelto, prior history of MSSA bacteremia 11/2020 in the setting of rotator cuff surgery. 11/29, patient presented to the ED with complaint of headache, cough, congestion, chills.  CT chest abdomen pelvis showed patchy small confluent and groundglass opacity in the right middle and lower lobe concerning for infection.  He was discharged on doxycycline.  Blood cultures sent on that presentation returned positive for MSSA 11/30, patient was called back to the ED for IV antibiotics  In the ED, patient was hemodynamically stable CBC, CMP unremarkable except for creatinine at baseline Lactic acid not elevated. Blood culture was sent again Admitted to [facility] MRI thoracic and lumbar spine was done which did not show any evidence of acute infection  Subjective: Patient was seen and examined this morning.   Pleasant elderly male.  Sitting up at the edge of the bed.  Not in distress [name] does not intermittent cough  Assessment and plan: MSSA bacteremia Community-acquired pneumonia Blood culture sent this admission it is also showing gram-positive cocci Currently on cefazolin a per ID recommendation. Surface echo did not show any evidence of vegetation.  Pending TEE MRI thoracic and lumbar did not show any evidence of active infection. Currently on Mucinex twice daily for cough Currently not on supplemental oxygen.  Monitor  Hypokalemia/hypomagnesemia Potassium and magnesium levels were low as below.  Replacement given. Potassium further low at 3.4 today.  Replacement ordered.  Normal magnesium and phosphorus level. Recent Labs  Lab 08/05/23 1400 08/05/23 1406  08/05/23 1439 08/06/23 1218 08/07/23 0523 08/07/23 0916  K 2.7* 2.7* 3.3* 3.9 3.4*  --   MG 1.6*  --   --   --   --  2.1  PHOS  --   --   --   --   --  2.6   Prolonged QT interval QT 560 / Qtc 592.  Likely due to low electrolytes avoid QT prolonging medications as able Electrolytes replaced as above Repeat EKG ordered today.  COPD Sarcoidosis Continue DuoNeb every 6 hours   Chronic HFpEF Hypertension Bilateral pedal edema  Last ECHO with LVEF 55-60% with G1DD. Follows up with [facility] cardiology PTA meds- Toprol XL 200 mg daily, Cardizem 360 mg daily, torsemide 20mg  daily, entresto 97-103mg  BID, imdur 90mg  daily, farxiga 10mg  daily, hydralazine 100 mg twice daily Currently continued on all.  Continue to monitor blood pressure   CAD/HLD H/o cardiac arrest 2002 Continue aspirin, statin   h/o CVA Aspirin and statin  h/o PE  completed Xarelto  CKD 3a Creatinine at baseline Recent Labs    05/17/23 0801 05/18/23 0828 05/19/23 0625 08/05/23 1400 08/05/23 1439 08/06/23 1218 08/07/23 0523  BUN 17 22 23 10 11 10 9   CREATININE 1.54* 1.80* 1.90* 1.33* 1.50* 1.63* 1.63*   OSA  intolerant to CPAP  BPH Flomax 0.4 mg daily  Insomnia Trazodone 150 mg bedtime as needed    Mobility: Encourage ambulation.  Goals of care   Code Status: Full Code     DVT prophylaxis:  enoxaparin (LOVENOX) injection 40 mg Start: 08/06/23 1700   Antimicrobials: IV Ancef Fluid: None Consultants: ID Family Communication: None at bedside  Status: Inpatient Level  of care:  Telemetry Medical   Patient is from: Home Needs to continue in-hospital care: On IV Ancef, pending TEE Anticipated d/c to: Pending clinical course   Diet:  Diet Order             Diet Heart Room service appropriate? Yes; Fluid consistency: Thin  Diet effective now                   Scheduled Meds:  aspirin EC  81 mg Oral Daily   atorvastatin  20 mg Oral Daily   dapagliflozin propanediol  10  mg Oral QAC breakfast   dextromethorphan-guaiFENesin  1 tablet Oral BID   diltiazem  360 mg Oral Daily   enoxaparin (LOVENOX) injection  40 mg Subcutaneous Q24H   hydrALAZINE  100 mg Oral BID   isosorbide mononitrate  90 mg Oral Daily   metoprolol  200 mg Oral Daily   sacubitril-valsartan  1 tablet Oral BID   sodium chloride flush  10 mL Intravenous Q12H   tamsulosin  0.4 mg Oral QPC breakfast   torsemide  20 mg Oral Daily    PRN meds: acetaminophen **OR** acetaminophen, polyethylene glycol, traZODone   Infusions:    ceFAZolin (ANCEF) IV 2 g (08/08/23 0522)    Antimicrobials: Anti-infectives (From admission, onward)    Start     Dose/Rate Route Frequency Ordered Stop   08/07/23 1400  cefTRIAXone (ROCEPHIN) 1 g in sodium chloride 0.9 % 100 mL IVPB  Status:  Discontinued        1 g 200 mL/hr over 30 Minutes Intravenous Every 24 hours 08/06/23 1534 08/06/23 1741   08/07/23 1400  ceFAZolin (ANCEF) IVPB 2g/100 mL premix        2 g 200 mL/hr over 30 Minutes Intravenous Every 8 hours 08/06/23 1741     08/06/23 2200  doxycycline (VIBRA-TABS) tablet 100 mg  Status:  Discontinued        100 mg Oral Every 12 hours 08/06/23 1534 08/07/23 1842   08/06/23 1315  cefTRIAXone (ROCEPHIN) 1 g in sodium chloride 0.9 % 100 mL IVPB  Status:  Discontinued        1 g 200 mL/hr over 30 Minutes Intravenous  Once 08/06/23 1311 08/06/23 1312   08/06/23 1315  cefTRIAXone (ROCEPHIN) 2 g in sodium chloride 0.9 % 100 mL IVPB        2 g 200 mL/hr over 30 Minutes Intravenous  Once 08/06/23 1312 08/06/23 1411   08/06/23 1145  cefTRIAXone (ROCEPHIN) 2 g in sodium chloride 0.9 % 100 mL IVPB  Status:  Discontinued        2 g 200 mL/hr over 30 Minutes Intravenous Every 24 hours 08/06/23 1141 08/06/23 1143   08/06/23 1145  doxycycline (VIBRA-TABS) tablet 100 mg        100 mg Oral  Once 08/06/23 1143 08/06/23 1214       Objective: Vitals:   08/08/23 0413 08/08/23 0728  BP: (!) 151/94 (!) 145/92  Pulse: 63  63  Resp: 18   Temp: 98.5 F (36.9 C) 97.9 F (36.6 C)  SpO2: 93% 95%    Intake/Output Summary (Last 24 hours) at 08/08/2023 1110 Last data filed at 08/07/2023 1542 Gross per 24 hour  Intake 100 ml  Output --  Net 100 ml    Filed Weights   08/06/23 1908 08/07/23 0500 08/08/23 0414  Weight: 100.2 kg 97.3 kg 99.2 kg   Weight change: 24.4 kg Body mass index is 29.66 kg/m.  Physical Exam: General exam: Pleasant, elderly.  Not in distress Skin: No rashes, lesions or ulcers. HEENT: Atraumatic, normocephalic, no obvious bleeding Lungs: Clear to auscultation bilaterally.  Mild cough only breathing CVS: Regular rate and rhythm, no murmur GI/Abd soft, nontender, nondistended, bowel sound present CNS: Alert, awake, oriented x 3 Psychiatry: Mood appropriate Extremities: No pedal edema, no calf tenderness  Data Review: I have personally reviewed the laboratory data and studies available.  F/u labs ordered Unresulted Labs (From admission, onward)     Start     Ordered   08/13/23 0500  Creatinine, serum  (enoxaparin (LOVENOX)    CrCl >/= 30 ml/min)  Weekly,   R     Comments: while on enoxaparin therapy    08/06/23 1501            Total time spent in review of labs and imaging, patient evaluation, formulation of plan, documentation and communication with family: 45 minutes  Signed, Lorin Glass, MD [facility] 08/08/2023

## 2023-08-08 NOTE — Progress Notes (Signed)
Mobility Specialist Progress Note:    08/08/23 1400  Mobility  Activity Ambulated with assistance in hallway  Level of Assistance Standby assist, set-up cues, supervision of patient - no hands on  Assistive Device None  Distance Ambulated (ft) 375 ft  Activity Response Tolerated well  Mobility Referral Yes  $Mobility charge 1 Mobility  Mobility Specialist Start Time (ACUTE ONLY) 1400  Mobility Specialist Stop Time (ACUTE ONLY) 1410  Mobility Specialist Time Calculation (min) (ACUTE ONLY) 10 min   Pt received in bed, agreeable to mobility session. Ambulated in hallway with SBA, no AD required. Tolerated well, asx throughout. Returned pt to room, all needs met, and RN in room.   [name] Mobility Specialist Please contact via Special educational needs teacher or  Rehab office at [phone]

## 2023-08-08 NOTE — Plan of Care (Signed)
  Problem: Education: Goal: Knowledge of General Education information will improve Description: Including pain rating scale, medication(s)/side effects and non-pharmacologic comfort measures Outcome: Progressing   Problem: Health Behavior/Discharge Planning: Goal: Ability to manage health-related needs will improve Outcome: Progressing   Problem: Clinical Measurements: Goal: Ability to maintain clinical measurements within normal limits will improve Outcome: Progressing Goal: Will remain free from infection Outcome: Progressing Goal: Diagnostic test results will improve Outcome: Progressing Goal: Respiratory complications will improve Outcome: Progressing Goal: Cardiovascular complication will be avoided Outcome: Progressing   Problem: Activity: Goal: Risk for activity intolerance will decrease Outcome: Progressing   Problem: Nutrition: Goal: Adequate nutrition will be maintained Outcome: Progressing   Problem: Coping: Goal: Level of anxiety will decrease Outcome: Progressing   Problem: Elimination: Goal: Will not experience complications related to bowel motility Outcome: Progressing Goal: Will not experience complications related to urinary retention Outcome: Progressing   Problem: Pain Management: Goal: General experience of comfort will improve Outcome: Progressing   Problem: Safety: Goal: Ability to remain free from injury will improve Outcome: Progressing   Problem: Skin Integrity: Goal: Risk for impaired skin integrity will decrease Outcome: Progressing   Problem: Education: Goal: Ability to demonstrate management of disease process will improve Outcome: Progressing Goal: Ability to verbalize understanding of medication therapies will improve Outcome: Progressing Goal: Individualized Educational Video(s) Outcome: Progressing   Problem: Activity: Goal: Capacity to carry out activities will improve Outcome: Progressing   Problem: Cardiac: Goal:  Ability to achieve and maintain adequate cardiopulmonary perfusion will improve Outcome: Progressing   Problem: Education: Goal: Knowledge of disease or condition will improve Outcome: Progressing Goal: Knowledge of the prescribed therapeutic regimen will improve Outcome: Progressing Goal: Individualized Educational Video(s) Outcome: Progressing   Problem: Activity: Goal: Ability to tolerate increased activity will improve Outcome: Progressing Goal: Will verbalize the importance of balancing activity with adequate rest periods Outcome: Progressing   Problem: Respiratory: Goal: Ability to maintain a clear airway will improve Outcome: Progressing Goal: Levels of oxygenation will improve Outcome: Progressing Goal: Ability to maintain adequate ventilation will improve Outcome: Progressing   Problem: Activity: Goal: Ability to tolerate increased activity will improve Outcome: Progressing   Problem: Clinical Measurements: Goal: Ability to maintain a body temperature in the normal range will improve Outcome: Progressing   Problem: Respiratory: Goal: Ability to maintain adequate ventilation will improve Outcome: Progressing Goal: Ability to maintain a clear airway will improve Outcome: Progressing

## 2023-08-08 NOTE — H&P (View-Only) (Signed)
PROGRESS NOTE  [name]  DOB: [birth year]  PCP: Creola Corn, MD EAV:[id number]  DOA: 08/06/2023  LOS: 2 days  Hospital Day: 3  Brief narrative: [name] is a 67 y.o. male with PMH significant for HTN, HLD, OSA intolerant to CPAP, CAD, diastolic CHF, h/o cardiac arrest 2002, h/o CVA, CKD, sarcoidosis, h/o PE completed Xarelto, prior history of MSSA bacteremia 11/2020 in the setting of rotator cuff surgery. 11/29, patient presented to the ED with complaint of headache, cough, congestion, chills.  CT chest abdomen pelvis showed patchy small confluent and groundglass opacity in the right middle and lower lobe concerning for infection.  He was discharged on doxycycline.  Blood cultures sent on that presentation returned positive for MSSA 11/30, patient was called back to the ED for IV antibiotics  In the ED, patient was hemodynamically stable CBC, CMP unremarkable except for creatinine at baseline Lactic acid not elevated. Blood culture was sent again Admitted to [facility] MRI thoracic and lumbar spine was done which did not show any evidence of acute infection  Subjective: Patient was seen and examined this morning.   Pleasant elderly male.  Sitting up at the edge of the bed.  Not in distress [name] does not intermittent cough  Assessment and plan: MSSA bacteremia Community-acquired pneumonia Blood culture sent this admission it is also showing gram-positive cocci Currently on cefazolin a per ID recommendation. Surface echo did not show any evidence of vegetation.  Pending TEE MRI thoracic and lumbar did not show any evidence of active infection. Currently on Mucinex twice daily for cough Currently not on supplemental oxygen.  Monitor  Hypokalemia/hypomagnesemia Potassium and magnesium levels were low as below.  Replacement given. Potassium further low at 3.4 today.  Replacement ordered.  Normal magnesium and phosphorus level. Recent Labs  Lab 08/05/23 1400 08/05/23 1406  08/05/23 1439 08/06/23 1218 08/07/23 0523 08/07/23 0916  K 2.7* 2.7* 3.3* 3.9 3.4*  --   MG 1.6*  --   --   --   --  2.1  PHOS  --   --   --   --   --  2.6   Prolonged QT interval QT 560 / Qtc 592.  Likely due to low electrolytes avoid QT prolonging medications as able Electrolytes replaced as above Repeat EKG ordered today.  COPD Sarcoidosis Continue DuoNeb every 6 hours   Chronic HFpEF Hypertension Bilateral pedal edema  Last ECHO with LVEF 55-60% with G1DD. Follows up with [facility] cardiology PTA meds- Toprol XL 200 mg daily, Cardizem 360 mg daily, torsemide 20mg  daily, entresto 97-103mg  BID, imdur 90mg  daily, farxiga 10mg  daily, hydralazine 100 mg twice daily Currently continued on all.  Continue to monitor blood pressure   CAD/HLD H/o cardiac arrest 2002 Continue aspirin, statin   h/o CVA Aspirin and statin  h/o PE  completed Xarelto  CKD 3a Creatinine at baseline Recent Labs    05/17/23 0801 05/18/23 0828 05/19/23 0625 08/05/23 1400 08/05/23 1439 08/06/23 1218 08/07/23 0523  BUN 17 22 23 10 11 10 9   CREATININE 1.54* 1.80* 1.90* 1.33* 1.50* 1.63* 1.63*   OSA  intolerant to CPAP  BPH Flomax 0.4 mg daily  Insomnia Trazodone 150 mg bedtime as needed    Mobility: Encourage ambulation.  Goals of care   Code Status: Full Code     DVT prophylaxis:  enoxaparin (LOVENOX) injection 40 mg Start: 08/06/23 1700   Antimicrobials: IV Ancef Fluid: None Consultants: ID Family Communication: None at bedside  Status: Inpatient Level  of care:  Telemetry Medical   Patient is from: Home Needs to continue in-hospital care: On IV Ancef, pending TEE Anticipated d/c to: Pending clinical course   Diet:  Diet Order             Diet Heart Room service appropriate? Yes; Fluid consistency: Thin  Diet effective now                   Scheduled Meds:  aspirin EC  81 mg Oral Daily   atorvastatin  20 mg Oral Daily   dapagliflozin propanediol  10  mg Oral QAC breakfast   dextromethorphan-guaiFENesin  1 tablet Oral BID   diltiazem  360 mg Oral Daily   enoxaparin (LOVENOX) injection  40 mg Subcutaneous Q24H   hydrALAZINE  100 mg Oral BID   isosorbide mononitrate  90 mg Oral Daily   metoprolol  200 mg Oral Daily   sacubitril-valsartan  1 tablet Oral BID   sodium chloride flush  10 mL Intravenous Q12H   tamsulosin  0.4 mg Oral QPC breakfast   torsemide  20 mg Oral Daily    PRN meds: acetaminophen **OR** acetaminophen, polyethylene glycol, traZODone   Infusions:    ceFAZolin (ANCEF) IV 2 g (08/08/23 0522)    Antimicrobials: Anti-infectives (From admission, onward)    Start     Dose/Rate Route Frequency Ordered Stop   08/07/23 1400  cefTRIAXone (ROCEPHIN) 1 g in sodium chloride 0.9 % 100 mL IVPB  Status:  Discontinued        1 g 200 mL/hr over 30 Minutes Intravenous Every 24 hours 08/06/23 1534 08/06/23 1741   08/07/23 1400  ceFAZolin (ANCEF) IVPB 2g/100 mL premix        2 g 200 mL/hr over 30 Minutes Intravenous Every 8 hours 08/06/23 1741     08/06/23 2200  doxycycline (VIBRA-TABS) tablet 100 mg  Status:  Discontinued        100 mg Oral Every 12 hours 08/06/23 1534 08/07/23 1842   08/06/23 1315  cefTRIAXone (ROCEPHIN) 1 g in sodium chloride 0.9 % 100 mL IVPB  Status:  Discontinued        1 g 200 mL/hr over 30 Minutes Intravenous  Once 08/06/23 1311 08/06/23 1312   08/06/23 1315  cefTRIAXone (ROCEPHIN) 2 g in sodium chloride 0.9 % 100 mL IVPB        2 g 200 mL/hr over 30 Minutes Intravenous  Once 08/06/23 1312 08/06/23 1411   08/06/23 1145  cefTRIAXone (ROCEPHIN) 2 g in sodium chloride 0.9 % 100 mL IVPB  Status:  Discontinued        2 g 200 mL/hr over 30 Minutes Intravenous Every 24 hours 08/06/23 1141 08/06/23 1143   08/06/23 1145  doxycycline (VIBRA-TABS) tablet 100 mg        100 mg Oral  Once 08/06/23 1143 08/06/23 1214       Objective: Vitals:   08/08/23 0413 08/08/23 0728  BP: (!) 151/94 (!) 145/92  Pulse: 63  63  Resp: 18   Temp: 98.5 F (36.9 C) 97.9 F (36.6 C)  SpO2: 93% 95%    Intake/Output Summary (Last 24 hours) at 08/08/2023 1110 Last data filed at 08/07/2023 1542 Gross per 24 hour  Intake 100 ml  Output --  Net 100 ml    Filed Weights   08/06/23 1908 08/07/23 0500 08/08/23 0414  Weight: 100.2 kg 97.3 kg 99.2 kg   Weight change: 24.4 kg Body mass index is 29.66 kg/m.  Physical Exam: General exam: Pleasant, elderly.  Not in distress Skin: No rashes, lesions or ulcers. HEENT: Atraumatic, normocephalic, no obvious bleeding Lungs: Clear to auscultation bilaterally.  Mild cough only breathing CVS: Regular rate and rhythm, no murmur GI/Abd soft, nontender, nondistended, bowel sound present CNS: Alert, awake, oriented x 3 Psychiatry: Mood appropriate Extremities: No pedal edema, no calf tenderness  Data Review: I have personally reviewed the laboratory data and studies available.  F/u labs ordered Unresulted Labs (From admission, onward)     Start     Ordered   08/13/23 0500  Creatinine, serum  (enoxaparin (LOVENOX)    CrCl >/= 30 ml/min)  Weekly,   R     Comments: while on enoxaparin therapy    08/06/23 1501            Total time spent in review of labs and imaging, patient evaluation, formulation of plan, documentation and communication with family: 45 minutes  Signed, Lorin Glass, MD [facility] 08/08/2023

## 2023-08-08 NOTE — Progress Notes (Signed)
   [facility] has been requested to perform a transesophageal echocardiogram on [name] for bacteremia.     The patient does NOT have any absolute or relative contraindications to a Transesophageal Echocardiogram (TEE).  The patient has: History of Obstructive Sleep Apnea    After careful review of history and examination, the risks and benefits of transesophageal echocardiogram have been explained including risks of esophageal damage, perforation (1:10,000 risk), bleeding, pharyngeal hematoma as well as other potential complications associated with conscious sedation including aspiration, arrhythmia, respiratory failure and death. Alternatives to treatment were discussed, questions were answered. Patient is willing to proceed.    Please maintain NPO after midnight. Will try to schedule TEE for tomorrow, will know exact time tomorrow morning. Orders placed. Please replace for hypokalemia.   SignedCyndi Bender, NP  08/08/2023 4:42 PM

## 2023-08-08 NOTE — Progress Notes (Signed)
Brief ID note:  Blood cultures from 11/30 remain ngtd TEE scheduled for later this week No changes Will continue to follow  [name]

## 2023-08-08 NOTE — Progress Notes (Signed)
Heart Failure Navigator Progress Note  Assessed for Heart & Vascular TOC clinic readiness.  Patient does not meet criteria. No TOC - more chronic CHF 60-65% .   Navigator will sign off at this time.   Rhae Hammock, BSN, Scientist, clinical (histocompatibility and immunogenetics) Only

## 2023-08-09 ENCOUNTER — Inpatient Hospital Stay (HOSPITAL_COMMUNITY): Payer: PPO | Admitting: Anesthesiology

## 2023-08-09 ENCOUNTER — Inpatient Hospital Stay (HOSPITAL_COMMUNITY): Payer: PPO

## 2023-08-09 ENCOUNTER — Encounter (HOSPITAL_COMMUNITY)
Admission: EM | Disposition: A | Discharge status: Home / Self Care | Payer: Self-pay | Source: Home / Self Care | Attending: Internal Medicine

## 2023-08-09 ENCOUNTER — Encounter (HOSPITAL_COMMUNITY): Payer: Self-pay | Admitting: Student

## 2023-08-09 DIAGNOSIS — I38 Endocarditis, valve unspecified: Secondary | ICD-10-CM

## 2023-08-09 DIAGNOSIS — I361 Nonrheumatic tricuspid (valve) insufficiency: Secondary | ICD-10-CM | POA: Diagnosis not present

## 2023-08-09 DIAGNOSIS — R7881 Bacteremia: Secondary | ICD-10-CM

## 2023-08-09 DIAGNOSIS — B9561 Methicillin susceptible Staphylococcus aureus infection as the cause of diseases classified elsewhere: Secondary | ICD-10-CM | POA: Diagnosis not present

## 2023-08-09 HISTORY — PX: TRANSESOPHAGEAL ECHOCARDIOGRAM (CATH LAB): EP1270

## 2023-08-09 LAB — CBC WITH DIFFERENTIAL/PLATELET
Abs Immature Granulocytes: 0.01 10*3/uL (ref 0.00–0.07)
Basophils Absolute: 0.1 10*3/uL (ref 0.0–0.1)
Basophils Relative: 1 %
Eosinophils Absolute: 0.8 10*3/uL — ABNORMAL HIGH (ref 0.0–0.5)
Eosinophils Relative: 13 %
HCT: 42.2 % (ref 39.0–52.0)
Hemoglobin: 14.3 g/dL (ref 13.0–17.0)
Immature Granulocytes: 0 %
Lymphocytes Relative: 18 %
Lymphs Abs: 1.1 10*3/uL (ref 0.7–4.0)
MCH: 31 pg (ref 26.0–34.0)
MCHC: 33.9 g/dL (ref 30.0–36.0)
MCV: 91.3 fL (ref 80.0–100.0)
Monocytes Absolute: 0.7 10*3/uL (ref 0.1–1.0)
Monocytes Relative: 12 %
Neutro Abs: 3.6 10*3/uL (ref 1.7–7.7)
Neutrophils Relative %: 56 %
Platelets: 212 10*3/uL (ref 150–400)
RBC: 4.62 MIL/uL (ref 4.22–5.81)
RDW: 14 % (ref 11.5–15.5)
WBC: 6.3 10*3/uL (ref 4.0–10.5)
nRBC: 0 % (ref 0.0–0.2)

## 2023-08-09 LAB — BASIC METABOLIC PANEL
Anion gap: 9 (ref 5–15)
BUN: 14 mg/dL (ref 8–23)
CO2: 27 mmol/L (ref 22–32)
Calcium: 9.2 mg/dL (ref 8.9–10.3)
Chloride: 101 mmol/L (ref 98–111)
Creatinine, Ser: 2.43 mg/dL — ABNORMAL HIGH (ref 0.61–1.24)
GFR, Estimated: 29 mL/min — ABNORMAL LOW (ref >60)
Glucose, Bld: 94 mg/dL (ref 70–99)
Potassium: 3.2 mmol/L — ABNORMAL LOW (ref 3.5–5.1)
Sodium: 137 mmol/L (ref 135–145)

## 2023-08-09 LAB — GLUCOSE, CAPILLARY: Glucose-Capillary: 103 mg/dL — ABNORMAL HIGH (ref 70–99)

## 2023-08-09 LAB — CULTURE, RESPIRATORY W GRAM STAIN

## 2023-08-09 LAB — ECHO TEE

## 2023-08-09 SURGERY — TRANSESOPHAGEAL ECHOCARDIOGRAM (TEE) (CATHLAB)
Anesthesia: Monitor Anesthesia Care

## 2023-08-09 MED ORDER — POTASSIUM CHLORIDE CRYS ER 20 MEQ PO TBCR
40.0000 meq | EXTENDED_RELEASE_TABLET | Freq: Once | ORAL | Status: AC
  Administered 2023-08-09: 40 meq via ORAL
  Filled 2023-08-09: qty 2

## 2023-08-09 MED ORDER — PROPOFOL 500 MG/50ML IV EMUL
INTRAVENOUS | Status: DC | PRN
  Administered 2023-08-09: 80 ug/kg/min via INTRAVENOUS

## 2023-08-09 MED ORDER — NALOXONE HCL 0.4 MG/ML IJ SOLN: 0.4000 mg | INTRAMUSCULAR | Status: DC | PRN

## 2023-08-09 MED ORDER — SODIUM CHLORIDE 0.9 % IV SOLN: INTRAVENOUS | Status: DC

## 2023-08-09 MED ORDER — OXYCODONE-ACETAMINOPHEN 5-325 MG PO TABS
1.0000 | ORAL_TABLET | Freq: Four times a day (QID) | ORAL | Status: DC | PRN
  Administered 2023-08-09 – 2023-08-10 (×2): 1 via ORAL
  Filled 2023-08-09 (×2): qty 1

## 2023-08-09 MED ORDER — HEPARIN SODIUM (PORCINE) 5000 UNIT/ML IJ SOLN
5000.0000 [IU] | Freq: Two times a day (BID) | INTRAMUSCULAR | Status: DC
  Administered 2023-08-09 – 2023-08-12 (×7): 5000 [IU] via SUBCUTANEOUS
  Filled 2023-08-09 (×6): qty 1

## 2023-08-09 MED ORDER — PROPOFOL 10 MG/ML IV BOLUS
INTRAVENOUS | Status: DC | PRN
  Administered 2023-08-09: 15 mg via INTRAVENOUS
  Administered 2023-08-09: 35 mg via INTRAVENOUS
  Administered 2023-08-09 (×2): 20 mg via INTRAVENOUS
  Administered 2023-08-09: 15 mg via INTRAVENOUS

## 2023-08-09 NOTE — Anesthesia Preprocedure Evaluation (Addendum)
Anesthesia Evaluation  Patient identified by MRN, date of birth, ID band Patient awake    Reviewed: Allergy & Precautions, NPO status , Patient's Chart, lab work & pertinent test results, reviewed documented beta blocker date and time   Airway Mallampati: III  TM Distance: >3 FB     Dental  (+) Edentulous Upper   Pulmonary asthma , sleep apnea and Continuous Positive Airway Pressure Ventilation , pneumonia, COPD, Patient abstained from smoking., former smoker, PE (hx of, previously on [facility]) sarcoidosis   breath sounds clear to auscultation       Cardiovascular hypertension, + CAD and +CHF   Rhythm:Regular Rate:Normal  C/f bacteremia   Neuro/Psych  PSYCHIATRIC DISORDERS       Neuromuscular disease CVA    GI/Hepatic ,GERD  ,,  Endo/Other    Renal/GU Renal disease     Musculoskeletal  (+) Arthritis ,    Abdominal   Peds  Hematology   Anesthesia Other Findings   Reproductive/Obstetrics                             Anesthesia Physical Anesthesia Plan  ASA: 3  Anesthesia Plan: MAC   Post-op Pain Management:    Induction:   PONV Risk Score and Plan: 1 and Ondansetron  Airway Management Planned:   Additional Equipment:   Intra-op Plan:   Post-operative Plan:   Informed Consent: I have reviewed the patients History and Physical, chart, labs and discussed the procedure including the risks, benefits and alternatives for the proposed anesthesia with the patient or authorized representative who has indicated his/her understanding and acceptance.     Dental advisory given  Plan Discussed with: CRNA  Anesthesia Plan Comments:         Anesthesia Quick Evaluation

## 2023-08-09 NOTE — Progress Notes (Signed)
Mobility Specialist Progress Note:   08/09/23 1151  Mobility  Activity Ambulated independently in hallway  Level of Assistance Standby assist, set-up cues, supervision of patient - no hands on  Assistive Device None  Distance Ambulated (ft) 375 ft  Activity Response Tolerated well  Mobility Referral Yes  $Mobility charge 1 Mobility  Mobility Specialist Start Time (ACUTE ONLY) 1145  Mobility Specialist Stop Time (ACUTE ONLY) 1151  Mobility Specialist Time Calculation (min) (ACUTE ONLY) 6 min   Pt received in bed, agreeable to mobility session. Requested meds for throat pain, notified RN. Ambulated in hallway and back to room, no AD required, SBA for safety. Tolerated well, left pt in room with all needs met.    Feliciana Rossetti Mobility Specialist Please contact via Special educational needs teacher or  Rehab office at [phone]

## 2023-08-09 NOTE — Progress Notes (Signed)
Transition of Care [facility]) - Inpatient Brief Assessment   Patient Details  Name: [name] MRN: [id number] Date of Birth: 31-Oct-1955  Transition of Care [facility]) CM/SW Contact:    Janae Bridgeman, RN Phone Number: 08/09/2023, 3:06 PM   Clinical Narrative: CM noted that patient admitted to the hospital for respiratory infection.  No TOC needs at this time.  CM will continue to follow the patient for [facility] needs as the patient progresses.   Transition of Care Asessment: Insurance and Status: (P) Insurance coverage has been reviewed Patient has primary care physician: (P) Yes Home environment has been reviewed: (P) From home with spouse Prior level of function:: (P) Independent Prior/Current Home Services: (P) No current home services Social Determinants of Health Reivew: (P) SDOH reviewed interventions complete Readmission risk has been reviewed: (P) Yes Transition of care needs: (P) transition of care needs identified, TOC will continue to follow

## 2023-08-09 NOTE — CV Procedure (Signed)
    TRANSESOPHAGEAL ECHOCARDIOGRAM   NAME:  [name]    MRN: [id number] DOB:  1955-11-07    ADMIT DATE: 08/06/2023  INDICATIONS: Bacteremia  PROCEDURE:   Informed consent was obtained prior to the procedure. The risks, benefits and alternatives for the procedure were discussed and the patient comprehended these risks.  Risks include, but are not limited to, cough, sore throat, vomiting, nausea, somnolence, esophageal and stomach trauma or perforation, bleeding, low blood pressure, aspiration, pneumonia, infection, trauma to the teeth and death.    Procedural time out performed. The oropharynx was anesthetized with viscous lidocaine.  Anesthesia was administered by the anaesthesilogy team to achieve and maintain moderate to deep conscious sedation.  The patient's heart rate, blood pressure, and oxygen saturation were monitored continuously during the procedure.  The transesophageal probe was inserted in the esophagus and stomach without difficulty and multiple views were obtained.   The patient tolerated the procedure well.  COMPLICATIONS:    There were no immediate complications.  KEY FINDINGS:  Normal left ventricular systolic function.  LVEF 60 to 65%, mild mitral regurgitation.  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  Full report to follow. Further management per primary team.   Thomasene Ripple, DO [facility] [place]  [facility]  9:27 AM

## 2023-08-09 NOTE — Anesthesia Postprocedure Evaluation (Signed)
Anesthesia Post Note  [name]  Procedure(s) Performed: TRANSESOPHAGEAL ECHOCARDIOGRAM     Patient location during evaluation: PACU Anesthesia Type: MAC Level of consciousness: awake and alert Pain management: pain level controlled Vital Signs Assessment: post-procedure vital signs reviewed and stable Respiratory status: spontaneous breathing, nonlabored ventilation, respiratory function stable and patient connected to nasal cannula oxygen Cardiovascular status: stable and blood pressure returned to baseline Postop Assessment: no apparent nausea or vomiting Anesthetic complications: no   No notable events documented.  Last Vitals:  Vitals:   08/09/23 0928 08/09/23 0930  BP: (!) 161/105 120/89  Pulse: 79 74  Resp: 16 (!) 23  Temp: 36.7 C   SpO2: 95% 94%    Last Pain:  Vitals:   08/09/23 0930  TempSrc:   PainSc: 0-No pain                 Mariann Barter

## 2023-08-09 NOTE — Plan of Care (Signed)
  Problem: Education: Goal: Knowledge of General Education information will improve Description: Including pain rating scale, medication(s)/side effects and non-pharmacologic comfort measures Outcome: Progressing   Problem: Health Behavior/Discharge Planning: Goal: Ability to manage health-related needs will improve Outcome: Progressing   Problem: Clinical Measurements: Goal: Ability to maintain clinical measurements within normal limits will improve Outcome: Progressing Goal: Will remain free from infection Outcome: Progressing Goal: Diagnostic test results will improve Outcome: Progressing Goal: Respiratory complications will improve Outcome: Progressing Goal: Cardiovascular complication will be avoided Outcome: Progressing   Problem: Activity: Goal: Risk for activity intolerance will decrease Outcome: Progressing   Problem: Nutrition: Goal: Adequate nutrition will be maintained Outcome: Progressing   Problem: Coping: Goal: Level of anxiety will decrease Outcome: Progressing   Problem: Elimination: Goal: Will not experience complications related to bowel motility Outcome: Progressing Goal: Will not experience complications related to urinary retention Outcome: Progressing   Problem: Pain Management: Goal: General experience of comfort will improve Outcome: Progressing   Problem: Safety: Goal: Ability to remain free from injury will improve Outcome: Progressing   Problem: Skin Integrity: Goal: Risk for impaired skin integrity will decrease Outcome: Progressing   Problem: Education: Goal: Ability to demonstrate management of disease process will improve Outcome: Progressing Goal: Ability to verbalize understanding of medication therapies will improve Outcome: Progressing Goal: Individualized Educational Video(s) Outcome: Progressing   Problem: Activity: Goal: Capacity to carry out activities will improve Outcome: Progressing   Problem: Cardiac: Goal:  Ability to achieve and maintain adequate cardiopulmonary perfusion will improve Outcome: Progressing   Problem: Education: Goal: Knowledge of disease or condition will improve Outcome: Progressing Goal: Knowledge of the prescribed therapeutic regimen will improve Outcome: Progressing Goal: Individualized Educational Video(s) Outcome: Progressing   Problem: Activity: Goal: Ability to tolerate increased activity will improve Outcome: Progressing Goal: Will verbalize the importance of balancing activity with adequate rest periods Outcome: Progressing   Problem: Respiratory: Goal: Ability to maintain a clear airway will improve Outcome: Progressing Goal: Levels of oxygenation will improve Outcome: Progressing Goal: Ability to maintain adequate ventilation will improve Outcome: Progressing   Problem: Activity: Goal: Ability to tolerate increased activity will improve Outcome: Progressing   Problem: Clinical Measurements: Goal: Ability to maintain a body temperature in the normal range will improve Outcome: Progressing   Problem: Respiratory: Goal: Ability to maintain adequate ventilation will improve Outcome: Progressing Goal: Ability to maintain a clear airway will improve Outcome: Progressing

## 2023-08-09 NOTE — Transfer of Care (Signed)
Immediate Anesthesia Transfer of Care Note  [name]  Procedure(s) Performed: TRANSESOPHAGEAL ECHOCARDIOGRAM  Patient Location: PACU  Anesthesia Type:MAC  Level of Consciousness: awake, alert , and oriented  Airway & Oxygen Therapy: Patient Spontanous Breathing and Patient connected to nasal cannula oxygen  Post-op Assessment: Report given to RN and Post -op Vital signs reviewed and stable  Post vital signs: Reviewed and stable  Last Vitals:  Vitals Value Taken Time  BP 120/89 08/09/23 0928  Temp 36.7 C 08/09/23 0928  Pulse 74 08/09/23 0929  Resp 23 08/09/23 0929  SpO2 94 % 08/09/23 0929  Vitals shown include unfiled device data.  Last Pain:  Vitals:   08/09/23 0928  TempSrc: Temporal  PainSc: 0-No pain         Complications: No notable events documented.

## 2023-08-09 NOTE — Progress Notes (Signed)
TRH night cross cover note:   I was notified by RN that the patient is complaining of some right wrist discomfort, without any report of associated erythema.  This discomfort has been refractory to existing order for prn acetaminophen.  I subsequently placed order for prn Percocet to further address this pain.    Newton Pigg, DO Hospitalist

## 2023-08-09 NOTE — Anesthesia Procedure Notes (Signed)
Procedure Name: MAC Date/Time: 08/09/2023 8:54 AM  Performed by: Ammie Dalton, CRNAPre-anesthesia Checklist: Patient identified, Emergency Drugs available, Suction available and Patient being monitored Patient Re-evaluated:Patient Re-evaluated prior to induction Oxygen Delivery Method: Nasal cannula Induction Type: IV induction Airway Equipment and Method: Bite block Placement Confirmation: positive ETCO2 Dental Injury: Teeth and Oropharynx as per pre-operative assessment

## 2023-08-09 NOTE — Interval H&P Note (Signed)
History and Physical Interval Note:  08/09/2023 8:29 AM  [name]  has presented today for surgery, with the diagnosis of bacteremia.  The various methods of treatment have been discussed with the patient and family. After consideration of risks, benefits and other options for treatment, the patient has consented to  Procedure(s): TRANSESOPHAGEAL ECHOCARDIOGRAM (N/A) as a surgical intervention.  The patient's history has been reviewed, patient examined, no change in status, stable for surgery.  I have reviewed the patient's chart and labs.  Questions were answered to the patient's satisfaction.     Lyden Redner

## 2023-08-09 NOTE — Progress Notes (Incomplete)
PHARMACY CONSULT NOTE FOR:  OUTPATIENT  PARENTERAL ANTIBIOTIC THERAPY (OPAT)  Indication: MSSA MV IE Regimen: Cefazolin 2g IV every 8 hours End date: 09/17/23 (6 weeks from neg BCx 11/30)  IV antibiotic discharge orders are pended. To discharging provider:  please sign these orders via discharge navigator,  Select New Orders & click on the button choice - Manage This Unsigned Work.    Thank you for allowing pharmacy to be a part of this patient's care.  Georgina Pillion, PharmD, BCPS, BCIDP Infectious Diseases Clinical Pharmacist 08/10/2023 4:09 PM   **Pharmacist phone directory can now be found on [email or web address] ([email or web address]).  Listed under [facility].

## 2023-08-09 NOTE — Progress Notes (Signed)
.  toci

## 2023-08-09 NOTE — Progress Notes (Signed)
PROGRESS NOTE  [name]  DOB: [birth year]  PCP: Creola Corn, MD ZOX:[id number]  DOA: 08/06/2023  LOS: 3 days  Hospital Day: 4  Brief narrative: [name] is a 67 y.o. male with PMH significant for HTN, HLD, OSA intolerant to CPAP, CAD, diastolic CHF, h/o cardiac arrest 2002, h/o CVA, CKD, sarcoidosis, h/o PE completed Xarelto, prior history of MSSA bacteremia 11/2020 in the setting of rotator cuff surgery. 11/29, patient presented to the ED with complaint of headache, cough, congestion, chills.  CT chest abdomen pelvis showed patchy small confluent and groundglass opacity in the right middle and lower lobe concerning for infection.  He was discharged on doxycycline.  Blood cultures sent on that presentation returned positive for MSSA 11/30, patient was called back to the ED for IV antibiotics  In the ED, patient was hemodynamically stable CBC, CMP unremarkable except for creatinine at baseline Lactic acid not elevated. Blood culture was sent again Admitted to [facility] MRI thoracic and lumbar spine was done which did not show any evidence of acute infection  Subjective: Patient was seen and examined this afternoon.  Sitting up in bed.  Not in distress.  No new symptoms.  Not on supplemental oxygen.  Underwent TEE this morning.  Labs from this morning with creatinine elevated.  Assessment and plan: MSSA bacteremia Community-acquired pneumonia Blood culture sent this admission it is also showing gram-positive cocci Currently on cefazolin a per ID recommendation. Surface echo did not show any evidence of vegetation.  Underwent TEE today.  Pending report MRI thoracic and lumbar did not show any evidence of active infection. Currently on Mucinex twice daily for cough Currently not on supplemental oxygen.  Monitor  Hypokalemia/hypomagnesemia Potassium and magnesium levels were low and replaced Labs this morning with potassium low at 3.2.  Replacement given. Recent Labs  Lab  08/05/23 1400 08/05/23 1406 08/05/23 1439 08/06/23 1218 08/07/23 0523 08/07/23 0916 08/09/23 0541  K 2.7* 2.7* 3.3* 3.9 3.4*  --  3.2*  MG 1.6*  --   --   --   --  2.1  --   PHOS  --   --   --   --   --  2.6  --    AKI on CKD 3a Creatinine at baseline less than 1.5.  In the last 24 hours, creatinine elevated to 2.43 Start normal saline at 75 mL/h Hold torsemide, Entresto, Farxiga  Recent Labs    05/17/23 0801 05/18/23 0828 05/19/23 0625 08/05/23 1400 08/05/23 1439 08/06/23 1218 08/07/23 0523 08/09/23 0541  BUN 17 22 23 10 11 10 9 14   CREATININE 1.54* 1.80* 1.90* 1.33* 1.50* 1.63* 1.63* 2.43*  CO2 25 26 26  17*  --  25 26 27    Chronic HFpEF Hypertension Bilateral pedal edema  Last ECHO with LVEF 55-60% with G1DD. Follows up with [facility] cardiology PTA meds- Toprol XL 200 mg daily, Cardizem 360 mg daily, torsemide 20mg  daily, entresto 97-103mg  BID, imdur 90mg  daily, farxiga 10mg  daily, hydralazine 100 mg twice daily All of those were continued.  With AKI today, I held torsemide, Entresto and [place].  Okay to continue Toprol, Cardizem and hydralazine.   Prolonged QT interval QT 560 / Qtc 592.  Likely due to low electrolytes avoid QT prolonging medications as able Electrolytes replaced as above Repeat EKG 12/2 showed QTc improved to 446 ms  COPD Sarcoidosis Continue DuoNeb every 6 hours   CAD/HLD H/o cardiac arrest 2002 Continue aspirin, statin   h/o CVA Aspirin and statin  h/o PE  completed Xarelto  OSA  intolerant to CPAP  BPH Flomax 0.4 mg daily  Insomnia Trazodone 150 mg bedtime as needed    Mobility: Encourage ambulation.  Goals of care   Code Status: Full Code     DVT prophylaxis:  heparin injection 5,000 Units Start: 08/09/23 1000   Antimicrobials: IV Ancef Fluid: None Consultants: ID Family Communication: None at bedside  Status: Inpatient Level of care:  Telemetry Medical   Patient is from: Home Needs to continue  in-hospital care: On IV Ancef, underwent TEE today. pending report Anticipated d/c to: Pending clinical course   Diet:  Diet Order             Diet Heart Room service appropriate? Yes; Fluid consistency: Thin  Diet effective now                   Scheduled Meds:  aspirin EC  81 mg Oral Daily   atorvastatin  20 mg Oral Daily   dextromethorphan-guaiFENesin  1 tablet Oral BID   diltiazem  360 mg Oral Daily   heparin injection (subcutaneous)  5,000 Units Subcutaneous Q12H   hydrALAZINE  100 mg Oral BID   isosorbide mononitrate  90 mg Oral Daily   metoprolol  200 mg Oral Daily   sodium chloride flush  10 mL Intravenous Q12H   tamsulosin  0.4 mg Oral QPC breakfast    PRN meds: acetaminophen **OR** acetaminophen, polyethylene glycol, traZODone   Infusions:   sodium chloride 75 mL/hr at 08/09/23 1040    ceFAZolin (ANCEF) IV Stopped (08/09/23 8657)    Antimicrobials: Anti-infectives (From admission, onward)    Start     Dose/Rate Route Frequency Ordered Stop   08/07/23 1400  cefTRIAXone (ROCEPHIN) 1 g in sodium chloride 0.9 % 100 mL IVPB  Status:  Discontinued        1 g 200 mL/hr over 30 Minutes Intravenous Every 24 hours 08/06/23 1534 08/06/23 1741   08/07/23 1400  ceFAZolin (ANCEF) IVPB 2g/100 mL premix        2 g 200 mL/hr over 30 Minutes Intravenous Every 8 hours 08/06/23 1741     08/06/23 2200  doxycycline (VIBRA-TABS) tablet 100 mg  Status:  Discontinued        100 mg Oral Every 12 hours 08/06/23 1534 08/07/23 1842   08/06/23 1315  cefTRIAXone (ROCEPHIN) 1 g in sodium chloride 0.9 % 100 mL IVPB  Status:  Discontinued        1 g 200 mL/hr over 30 Minutes Intravenous  Once 08/06/23 1311 08/06/23 1312   08/06/23 1315  cefTRIAXone (ROCEPHIN) 2 g in sodium chloride 0.9 % 100 mL IVPB        2 g 200 mL/hr over 30 Minutes Intravenous  Once 08/06/23 1312 08/06/23 1411   08/06/23 1145  cefTRIAXone (ROCEPHIN) 2 g in sodium chloride 0.9 % 100 mL IVPB  Status:  Discontinued         2 g 200 mL/hr over 30 Minutes Intravenous Every 24 hours 08/06/23 1141 08/06/23 1143   08/06/23 1145  doxycycline (VIBRA-TABS) tablet 100 mg        100 mg Oral  Once 08/06/23 1143 08/06/23 1214       Objective: Vitals:   08/09/23 0930 08/09/23 1035  BP: 120/89 (!) 150/107  Pulse: 74 64  Resp: (!) 23   Temp:    SpO2: 94%     Intake/Output Summary (Last 24 hours) at 08/09/2023 1333 Last data  filed at 08/09/2023 1914 Gross per 24 hour  Intake 400 ml  Output --  Net 400 ml    Filed Weights   08/07/23 0500 08/08/23 0414 08/09/23 0602  Weight: 97.3 kg 99.2 kg 97.9 kg   Weight change: -1.3 kg Body mass index is 29.27 kg/m.   Physical Exam: General exam: Pleasant, elderly.  Not in distress Skin: No rashes, lesions or ulcers. HEENT: Atraumatic, normocephalic, no obvious bleeding Lungs: Clear to auscultation bilaterally.  Mild cough only breathing CVS: Regular rate and rhythm, no murmur GI/Abd soft, nontender, nondistended, bowel sound present CNS: Alert, awake, oriented x 3 Psychiatry: Mood appropriate Extremities: No pedal edema, no calf tenderness  Data Review: I have personally reviewed the laboratory data and studies available.  F/u labs ordered [employer] (From admission, onward)     Start     Ordered   Signed and Interior and spatial designer  Tomorrow morning,   R        Signed and Held            Total time spent in review of labs and imaging, patient evaluation, formulation of plan, documentation and communication with family: 45 minutes  Signed, Lorin Glass, MD [facility] 08/09/2023

## 2023-08-10 ENCOUNTER — Other Ambulatory Visit: Payer: Self-pay

## 2023-08-10 ENCOUNTER — Encounter (HOSPITAL_COMMUNITY): Payer: Self-pay | Admitting: Student

## 2023-08-10 DIAGNOSIS — I5032 Chronic diastolic (congestive) heart failure: Secondary | ICD-10-CM

## 2023-08-10 DIAGNOSIS — I339 Acute and subacute endocarditis, unspecified: Secondary | ICD-10-CM | POA: Diagnosis not present

## 2023-08-10 DIAGNOSIS — D151 Benign neoplasm of heart: Secondary | ICD-10-CM | POA: Diagnosis not present

## 2023-08-10 DIAGNOSIS — I059 Rheumatic mitral valve disease, unspecified: Secondary | ICD-10-CM

## 2023-08-10 DIAGNOSIS — R931 Abnormal findings on diagnostic imaging of heart and coronary circulation: Secondary | ICD-10-CM

## 2023-08-10 DIAGNOSIS — R7881 Bacteremia: Secondary | ICD-10-CM | POA: Diagnosis not present

## 2023-08-10 DIAGNOSIS — I10 Essential (primary) hypertension: Secondary | ICD-10-CM

## 2023-08-10 DIAGNOSIS — J189 Pneumonia, unspecified organism: Secondary | ICD-10-CM | POA: Diagnosis not present

## 2023-08-10 DIAGNOSIS — B9561 Methicillin susceptible Staphylococcus aureus infection as the cause of diseases classified elsewhere: Secondary | ICD-10-CM | POA: Diagnosis not present

## 2023-08-10 DIAGNOSIS — I340 Nonrheumatic mitral (valve) insufficiency: Secondary | ICD-10-CM | POA: Diagnosis not present

## 2023-08-10 DIAGNOSIS — G4733 Obstructive sleep apnea (adult) (pediatric): Secondary | ICD-10-CM

## 2023-08-10 DIAGNOSIS — N400 Enlarged prostate without lower urinary tract symptoms: Secondary | ICD-10-CM | POA: Insufficient documentation

## 2023-08-10 DIAGNOSIS — Z86711 Personal history of pulmonary embolism: Secondary | ICD-10-CM

## 2023-08-10 DIAGNOSIS — N1832 Chronic kidney disease, stage 3b: Secondary | ICD-10-CM

## 2023-08-10 LAB — BASIC METABOLIC PANEL
Anion gap: 7 (ref 5–15)
BUN: 12 mg/dL (ref 8–23)
CO2: 25 mmol/L (ref 22–32)
Calcium: 8.7 mg/dL — ABNORMAL LOW (ref 8.9–10.3)
Chloride: 107 mmol/L (ref 98–111)
Creatinine, Ser: 1.77 mg/dL — ABNORMAL HIGH (ref 0.61–1.24)
GFR, Estimated: 42 mL/min — ABNORMAL LOW (ref >60)
Glucose, Bld: 128 mg/dL — ABNORMAL HIGH (ref 70–99)
Potassium: 3.2 mmol/L — ABNORMAL LOW (ref 3.5–5.1)
Sodium: 139 mmol/L (ref 135–145)

## 2023-08-10 LAB — CULTURE, BLOOD (ROUTINE X 2): Culture: NO GROWTH

## 2023-08-10 LAB — GLUCOSE, CAPILLARY: Glucose-Capillary: 107 mg/dL — ABNORMAL HIGH (ref 70–99)

## 2023-08-10 MED ORDER — POTASSIUM CHLORIDE CRYS ER 20 MEQ PO TBCR
40.0000 meq | EXTENDED_RELEASE_TABLET | Freq: Once | ORAL | Status: AC
  Administered 2023-08-10: 40 meq via ORAL
  Filled 2023-08-10: qty 2

## 2023-08-10 MED ORDER — CEFAZOLIN IV (FOR PTA / DISCHARGE USE ONLY): 2.0000 g | Freq: Three times a day (TID) | INTRAVENOUS | 0 refills | Status: AC

## 2023-08-10 NOTE — Assessment & Plan Note (Addendum)
Since admission until 08-09-2023 Bilateral pedal edema  Last ECHO with LVEF 55-60% with G1DD. Follows up with [facility] cardiology PTA meds- Toprol XL 200 mg daily, Cardizem 360 mg daily, torsemide 20mg  daily, entresto 97-103mg  BID, imdur 90mg  daily, farxiga 10mg  daily, hydralazine 100 mg twice daily All of those were continued.  With AKI today, pt's torsemide, Entresto and Farxiga were held.  Okay to continue Toprol, Cardizem and hydralazine.  08-11-2023. Continue with Toprol-XL 200 mg every day, cardizem-CD 360 mg every day, hydralazine 100 mg bid. Will change torsemide to prn at discharge. Continue to hold farxiga and entresto. Will get pt an followup appointment with cardiology so they can resume this medication after discharge.  08-12-2023 DC to home on Toprol-XL 200 mg every day, cardizem-CD 360 mg every day, hydralazine 100 mg bid.  demadex 20 mg every day prn +1 lbs weight in 1 day or +3 lbs in 1 week. Confirmed with Dr. Odis Hollingshead.  Will stop Entrestos and [place] on his DC meds. These will need to be restarted when he f/u with cardiology next week. Likely restart meds one at a time...not at the same time.

## 2023-08-10 NOTE — Plan of Care (Signed)
  Problem: Education: Goal: Knowledge of General Education information will improve Description: Including pain rating scale, medication(s)/side effects and non-pharmacologic comfort measures Outcome: Progressing   Problem: Health Behavior/Discharge Planning: Goal: Ability to manage health-related needs will improve Outcome: Progressing   Problem: Clinical Measurements: Goal: Ability to maintain clinical measurements within normal limits will improve Outcome: Progressing Goal: Will remain free from infection Outcome: Progressing Goal: Diagnostic test results will improve Outcome: Progressing Goal: Respiratory complications will improve Outcome: Progressing Goal: Cardiovascular complication will be avoided Outcome: Progressing   Problem: Activity: Goal: Risk for activity intolerance will decrease Outcome: Progressing   Problem: Nutrition: Goal: Adequate nutrition will be maintained Outcome: Progressing   Problem: Coping: Goal: Level of anxiety will decrease Outcome: Progressing   Problem: Elimination: Goal: Will not experience complications related to bowel motility Outcome: Progressing Goal: Will not experience complications related to urinary retention Outcome: Progressing   Problem: Pain Management: Goal: General experience of comfort will improve Outcome: Progressing   Problem: Safety: Goal: Ability to remain free from injury will improve Outcome: Progressing   Problem: Skin Integrity: Goal: Risk for impaired skin integrity will decrease Outcome: Progressing   Problem: Education: Goal: Ability to demonstrate management of disease process will improve Outcome: Progressing Goal: Ability to verbalize understanding of medication therapies will improve Outcome: Progressing Goal: Individualized Educational Video(s) Outcome: Progressing   Problem: Activity: Goal: Capacity to carry out activities will improve Outcome: Progressing   Problem: Cardiac: Goal:  Ability to achieve and maintain adequate cardiopulmonary perfusion will improve Outcome: Progressing   Problem: Education: Goal: Knowledge of disease or condition will improve Outcome: Progressing Goal: Knowledge of the prescribed therapeutic regimen will improve Outcome: Progressing Goal: Individualized Educational Video(s) Outcome: Progressing   Problem: Activity: Goal: Ability to tolerate increased activity will improve Outcome: Progressing Goal: Will verbalize the importance of balancing activity with adequate rest periods Outcome: Progressing   Problem: Respiratory: Goal: Ability to maintain a clear airway will improve Outcome: Progressing Goal: Levels of oxygenation will improve Outcome: Progressing Goal: Ability to maintain adequate ventilation will improve Outcome: Progressing   Problem: Activity: Goal: Ability to tolerate increased activity will improve Outcome: Progressing   Problem: Clinical Measurements: Goal: Ability to maintain a body temperature in the normal range will improve Outcome: Progressing   Problem: Respiratory: Goal: Ability to maintain adequate ventilation will improve Outcome: Progressing Goal: Ability to maintain a clear airway will improve Outcome: Progressing

## 2023-08-10 NOTE — Consult Note (Addendum)
[address]       [place] [address]             [phone]        [name] [facility] Medical Record #[id number] Date of Birth: Aug 04, 1956  Referring: [facility] Primary Care: [name] Primary Cardiologist: [name]  Reason for Consult: Evaluation for potential surgical management of posterior mitral valve leaflet mass in the setting of MSSA bacteremia.    History of Present Illness:     [name] is a 67 year old gentleman with a past medical history notable for hypertension, dyslipidemia, obstructive sleep apnea intolerant of CPAP, coronary artery disease, chronic heart failure with preserved ejection fraction, history of pulmonary embolus, CKD stage III, gastroesophageal reflux disease, chronic obstructive pulmonary disease with history of sarcoidosis, known over 4 cm thoracic aortic dilation.  He presented to the emergency room on 08/05/2023 with a complaint of chest pressure associated with headache and myalgias.  Initial workup included a CT scanning of the chest abdomen and pelvis showing groundglass opacities in the right middle and right lower lobes.  Other workup and lab data was essentially unremarkable.  Blood cultures were obtained at the time of that initial ED encounter.  He was discharged home on doxycycline for treatment of suspected community-acquired pneumonia in the right middle and lower lobes.  1 of 2 blood cultures resulted positive for methicillin sensitive Staphylococcus aureus so Mr. Bostrom was asked to return to the ED for further evaluation.  He was admitted to the hospital by the inpatient team and started on IV Ancef.  An Infectious Disease consult was provided by Dr. Danelle Hall and further workup ensued.  Blood cultures were repeated and have shown no growth at 4 days.  A transthoracic echo was obtained that showed no evidence of a source for bacteremia.  An MRI of the lumbar and thoracic spine  was also negative for any evidence of an infectious process.  Respiratory cultures were obtained showing normal flora with no growth of Staph aureus or Pseudomonas species.  The infectious disease team recommended proceeding with transesophageal echo since Mr. Fistler had also had a previous episode of bacteremia in 2022 with no confirmed source.  Transesophageal echo was carried out yesterday by Dr. Ahmed Hall and was notable for a mobile 5 mm x 8 mm in the right ventricle felt to be associated with either the chordae or the ventricular wall.  In addition, there is a small mobile mass (no measurements given) on the posterior leaflet of the mitral valve.  There was trivial mitral insufficiency and no mitral stenosis.  Mild thickening of the mitral valve leaflets was noted.  Biventricular function was normal and there were no other significant valvular abnormalities.  Since admission to the hospital, [name] has had clinical improvement of his symptoms after initiation of IV antibiotics for his bacteremia.  He has remained afebrile and hemodynamically stable.   Current Activity/ Functional Status: Patient is independent with mobility/ambulation, transfers, ADL's, IADL's.   Zubrod Score: At the time of this encountery this patient's most appropriate activity status/level should be described as: [x]     0    Normal activity, no symptoms []     1    Restricted in physical strenuous activity but ambulatory, able to Hall out light work []     2    Ambulatory and capable of self care, unable to Hall work activities, up and about  more than 50%  Of the time                            []     3    Only limited self care, in bed greater than 50% of waking hours []     4    Completely disabled, no self care, confined to bed or chair []     5    Moribund  Past Medical History:  Diagnosis Date   Back pain    CHF (congestive heart failure) (HCC)    CKD (chronic kidney disease), stage III (HCC)    Closed  nondisplaced fracture of first right metatarsal bone 06/23/2022   CVA (cerebral infarction)    Diastolic dysfunction    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   ED (erectile dysfunction)    GERD (gastroesophageal reflux disease)    Hypercalcemia    Hyperlipidemia    Hypertension    Insomnia    Long-term use of high-risk medication    Nephrocalcinosis    Nephrolithiasis    Obesity    Osteopenia    Pancreatitis    admitted 03/19/01-06/05/01   PE (pulmonary embolism) 01/30/2013   Proteinuria    Pulmonary embolism (HCC) 01/30/2013   Sarcoidosis    Sleep apnea    Smoker    Stroke [facility])     Past Surgical History:  Procedure Laterality Date   ABDOMINAL EXPLORATION SURGERY     BACK SURGERY     CHOLECYSTECTOMY     RIGHT/LEFT HEART CATH AND CORONARY ANGIOGRAPHY N/A 05/12/2021   Procedure: RIGHT/LEFT HEART CATH AND CORONARY ANGIOGRAPHY;  Surgeon: [name];  Location: [facility] INV[facility]IVE CV [facility];  Service: Cardiovascular;  Laterality: N/A;   SHOULDER ARTHROSCOPY Right 10/30/2020   Procedure: ARTHROSCOPY SHOULDER WITH EXTENSIVE DEBRIDEMENT;  Surgeon: [name];  Location: [facility];  Service: Orthopedics;  Laterality: Right;   SHOULDER SURGERY     TRACHEOSTOMY     closed   TRANSESOPHAGEAL ECHOCARDIOGRAM (CATH LAB) N/A 08/09/2023   Procedure: TRANSESOPHAGEAL ECHOCARDIOGRAM;  Surgeon: [name];  Location: [facility] INVASIVE CV LAB;  Service: Cardiovascular;  Laterality: N/A;    Social History   Tobacco Use  Smoking Status Former   Current packs/day: 0.00   Average packs/day: 0.3 packs/day for 15.5 years (3.9 ttl pk-yrs)   Types: Cigarettes   Start date: 09/06/2005   Quit date: 02/23/2021   Years since quitting: 2.4  Smokeless Tobacco Never    Social History   Substance and Sexual Activity  Alcohol Use No   Alcohol/week: 0.0 standard drinks of alcohol     Allergies  Allergen Reactions   Codeine  Nausea And Vomiting   Hydrochlorothiazide Other (See Comments)    Unknown per Pt.    Hydrocodone-Acetaminophen Nausea And Vomiting   Hydromorphone Other (See Comments)    hallucinations     Current Facility-Administered Medications  Medication Dose Route Frequency Provider Last Rate Last Admin   0.9 %  sodium chloride infusion   Intravenous Continuous Dahal, Binaya, Hall 75 mL/hr at 08/10/23 1006 New Bag at 08/10/23 1006   acetaminophen (TYLENOL) tablet 650 mg  650 mg Oral Q6H PRN Briscoe Burns, Hall   650 mg at 08/09/23 1148   Or   acetaminophen (TYLENOL) suppository 650 mg  650 mg Rectal Q6H PRN Briscoe Burns, Hall  aspirin EC tablet 81 mg  81 mg Oral Daily Briscoe Burns, Hall   81 mg at 08/10/23 1010   atorvastatin (LIPITOR) tablet 20 mg  20 mg Oral Daily Briscoe Burns, Hall   20 mg at 08/10/23 1010   ceFAZolin (ANCEF) IVPB 2g/100 mL premix  2 g Intravenous Q8H Briscoe Burns, Hall   Stopped at 08/10/23 0540   dextromethorphan-guaiFENesin (MUCINEX DM) 30-600 MG per 12 hr tablet 1 tablet  1 tablet Oral BID Briscoe Burns, Hall   1 tablet at 08/10/23 1010   diltiazem (CARDIZEM CD) 24 hr capsule 360 mg  360 mg Oral Daily Briscoe Burns, Hall   360 mg at 08/10/23 1017   heparin injection 5,000 Units  5,000 Units Subcutaneous Q12H Lorin Glass, Hall   5,000 Units at 08/10/23 1009   hydrALAZINE (APRESOLINE) tablet 100 mg  100 mg Oral BID Briscoe Burns, Hall   100 mg at 08/10/23 1010   isosorbide mononitrate (IMDUR) 24 hr tablet 90 mg  90 mg Oral Daily Briscoe Burns, Hall   90 mg at 08/10/23 1009   metoprolol succinate (TOPROL-XL) 24 hr tablet 200 mg  200 mg Oral Daily Briscoe Burns, Hall   200 mg at 08/10/23 1009   naloxone (NARCAN) injection 0.4 mg  0.4 mg Intravenous PRN Howerter, Justin B, Hall       oxyCODONE-acetaminophen (PERCOCET/ROXICET) 5-325 MG per tablet 1 tablet  1 tablet Oral Q6H PRN Howerter, Justin B, Hall   1 tablet at 08/09/23 2045   polyethylene glycol (MIRALAX /  GLYCOLAX) packet 17 g  17 g Oral Daily PRN Briscoe Burns, Hall       sodium chloride flush (NS) 0.9 % injection 10 mL  10 mL Intravenous Q12H Rolan Bucco, Hall   10 mL at 08/10/23 1011   tamsulosin (FLOMAX) capsule 0.4 mg  0.4 mg Oral QPC breakfast Briscoe Burns, Hall   0.4 mg at 08/10/23 1009   traZODone (DESYREL) tablet 150 mg  150 mg Oral QHS PRN Briscoe Burns, Hall   150 mg at 08/09/23 2204    Medications Prior to Admission  Medication Sig Dispense Refill Last Dose   aspirin EC 81 MG tablet Take 81 mg by mouth daily. Patients Wife gave him 4 tablets this am. 08/06/2023.   08/06/2023   atorvastatin (LIPITOR) 20 MG tablet Take 20 mg by mouth daily.   08/06/2023   dapagliflozin propanediol (FARXIGA) 10 MG TABS tablet Take 1 tablet (10 mg total) by mouth daily before breakfast. 90 tablet 3 08/06/2023   diltiazem (CARDIZEM CD) 360 MG 24 hr capsule Take 1 capsule (360 mg total) by mouth daily. 90 capsule 3 08/06/2023   doxycycline (VIBRAMYCIN) 100 MG capsule Take 1 capsule (100 mg total) by mouth 2 (two) times daily. 20 capsule 0 08/06/2023   hydrALAZINE (APRESOLINE) 100 MG tablet Take 100 mg by mouth 2 (two) times daily.   08/06/2023   ipratropium-albuterol (DUONEB) 0.5-2.5 (3) MG/3ML SOLN Use 3 mLs by nebulization every 6 (six) hours for 20 days. 270 mL 0 08/06/2023   isosorbide mononitrate (IMDUR) 30 MG 24 hr tablet Take 3 tablets (90 mg total) by mouth daily. 270 tablet 3 08/06/2023   metoprolol (TOPROL-XL) 200 MG 24 hr tablet Take 1 tablet (200 mg total) by mouth daily. 90 tablet 3 08/06/2023   potassium chloride SA (KLOR-CON M) 20 MEQ tablet Take 1 tablet (20 mEq total) by mouth daily. 90 tablet 1 08/06/2023  sacubitril-valsartan (ENTRESTO) 97-103 MG Take 1 tablet by mouth 2 (two) times daily. (Patient taking differently: Take 1 tablet by mouth daily.) 180 tablet 3 08/06/2023   tamsulosin (FLOMAX) 0.4 MG CAPS capsule Take 1 capsule by mouth daily 30 capsule 6 08/06/2023   torsemide  (DEMADEX) 20 MG tablet Take 1 tablet (20 mg total) by mouth daily. 30 tablet 2 Past Month   traZODone (DESYREL) 150 MG tablet Take 1 tablet (150 mg total) by mouth at bedtime as needed for sleep. (Patient taking differently: Take 150 mg by mouth at bedtime.) 90 tablet 3 08/05/2023   triamcinolone cream (KENALOG) 0.1 % Apply to affected areas twice a day as needed for itching/inflammation. 80 g 2 Past Month   VITAMIN D, CHOLECALCIFEROL, PO Take 1 capsule by mouth daily.   08/06/2023    Family History  Problem Relation Age of Onset   Hypertension Father    CVA Father    Lung cancer Father    Alzheimer's disease Mother    Hypertension Sister    Heart failure Sister      Review of Systems:     Cardiac Review of Systems: Y or  [    ]= no  Chest Pain [    ]  Resting SOB [   ] Exertional SOB  [  ]  Orthopnea [  ]   Pedal Edema [   ]    Palpitations [  ] Syncope  [  ]   Presyncope [   ]  General Review of Systems: [Y] = yes [  ]=no Constitional: recent weight change [  ]; anorexia [  ]; fatigue [  ]; nausea [  ]; night sweats [  ]; fever [  ]; or chills [  ]                                                               Dental: Last Dentist visit: Full denture top and bottom  Eye : blurred vision [  ]; diplopia [   ]; vision changes [  ];  Amaurosis fugax[  ]; Resp: cough [  ];  wheezing[  ];  hemoptysis[  ]; shortness of breath[ x ]; paroxysmal nocturnal dyspnea[  ]; dyspnea on exertion[  ]; or orthopnea[  ];  GI:  gallstones[  ], vomiting[  ];  dysphagia[  ]; melena[  ];  hematochezia [  ]; heartburn[  ];   Hx of  Colonoscopy[  ]; GU: kidney stones [  ]; hematuria[  ];   dysuria [  ];  nocturia[  ];  history of     obstruction [  ]; urinary frequency [  ]             Skin: rash, swelling[  ];, hair loss[  ];  peripheral edema[  ];  or itching[  ]; Musculosketetal: myalgias[  ];  joint swelling[  ];  joint erythema[  ];  joint pain[  ];  back pain[  ];  Heme/Lymph: bruising[  ];  bleeding[   ];  anemia[  ];  Neuro: TIA[  ];  headaches[  ];  stroke[  ];  vertigo[  ];  seizures[  ];   paresthesias[  ];  difficulty walking[  ];  Psych:depression[  ];  anxiety[  ];  Endocrine: diabetes[  ];  thyroid dysfunction[  ];                Physical Exam: BP (!) 157/95 (BP Location: Right Arm)   Pulse 64   Temp 98.2 F (36.8 C) (Oral)   Resp 18   Ht 6' (1.829 m)   Wt 97.8 kg   SpO2 96%   BMI 29.24 kg/m    General appearance: alert, cooperative, and no distress Head: Normocephalic, without obvious abnormality, atraumatic Neck: no adenopathy, no carotid bruit, no JVD, and supple, symmetrical, trachea midline Lymph nodes: No cervical or clavicular adenopathy Resp: clear to auscultation bilaterally and normal work of breath on room air. Cardio: RRR, no murmur, the mobile monitor shows NSR.  GI: soft, no tenderness Extremities: no deformities, all well perfused with easily palpable distal pulses.  Mild erythema and warmth over an old IV site right forearm. There bruise with a 2cm hematoma at the right lateral calf that is also mildly tender.  (He is unaware of any injury to this site). Neurologic: Alert and oriented X 3, normal strength and tone.    Diagnostic Studies & Laboratory data:     Recent Radiology Findings:   TRANSESOPHOGEAL ECHO REPORT    Patient Name:   JAKELL DELSORDO Date of Exam: 08/09/2023  Medical Rec #:  [id number]       Height:       72.0 in  Accession #:    [id number]      Weight:       215.8 lb  Date of Birth:  [birth year]       BSA:          2.201 m  Patient Age:    67 years        BP:           153/95 mmHg  Patient Gender: M               HR:           75 bpm.  Exam Location:  Inpatient   Procedure: Transesophageal Echo, Cardiac Doppler and Color Doppler   Indications:     Endocarditis    History:         Patient has prior history of Echocardiogram examinations,  most                  recent 08/07/2023. CHF; Risk Factors:Hypertension and  Sleep                   Apnea.    Sonographer:     Darlys Gales  Referring Phys:  [id number] Cyndi Bender  Diagnosing Phys: Manuel Ripple Hall   PROCEDURE: After discussion of the risks and benefits of a TEE, an  informed consent was obtained from the patient. The transesophogeal probe  was passed without difficulty through the esophogus of the patient.  Sedation performed by different physician.  The patient was monitored while under deep sedation. Anesthestetic  sedation was provided intravenously by Anesthesiology: 470mg  of Propofol.  The patient developed no complications during the procedure.    IMPRESSIONS     1. Left ventricular ejection fraction, by estimation, is 60 to 65%. The  left ventricle has normal function.   2. There is a mobile mass (0.5 cm x 0.8 cm )in the right ventricle  suspected either on the chordae or the wall of the ventricle. Right  ventricular systolic function is normal. The right  ventricular size is  normal.   3. No left atrial/left atrial appendage thrombus was detected. The LAA  emptying velocity was 104 cm/s.   4. There is fibrinous mobile mass of the posterior leaflet of the mitral  valve.. The mitral valve is abnormal. Trivial mitral valve regurgitation.  No evidence of mitral stenosis.   5. The aortic valve is normal in structure. There is moderate thickening  of the aortic valve. Aortic valve regurgitation is not visualized. No  aortic stenosis is present.   6. There is borderline dilatation of the aortic root, measuring 38 mm.   FINDINGS   Left Ventricle: Left ventricular ejection fraction, by estimation, is 60  to 65%. The left ventricle has normal function. The left ventricular  internal cavity size was normal in size.   Right Ventricle: There is a mobile mass (0.5 cm x 0.8 cm )in the right  ventricle suspected either on the chordae or the wall of the ventricle.  The right ventricular size is normal. Right vetricular wall thickness was  not well visualized.  Right  ventricular systolic function is normal.   Left Atrium: Left atrial size was not well visualized. No left atrial/left  atrial appendage thrombus was detected. The LAA emptying velocity was 104  cm/s.   Right Atrium: Right atrial size was not well visualized.   Pericardium: Trivial pericardial effusion is present. The pericardial  effusion is anterior to the right ventricle.   Mitral Valve: There is fibrinous mobile mass of the posterior leaflet of  the mitral valve. The mitral valve is abnormal. There is mild thickening  of the mitral valve leaflet(s). Trivial mitral valve regurgitation. No  evidence of mitral valve stenosis.   Tricuspid Valve: The tricuspid valve is grossly normal. Tricuspid valve  regurgitation is mild . No evidence of tricuspid stenosis.   Aortic Valve: The aortic valve is normal in structure. There is moderate  thickening of the aortic valve. Aortic valve regurgitation is not  visualized. No aortic stenosis is present.   Pulmonic Valve: The pulmonic valve was not well visualized. Pulmonic valve  regurgitation is trivial.   Aorta: There is borderline dilatation of the aortic root, measuring 38 mm.   Venous: The right upper pulmonary vein, right lower pulmonary vein, left  upper pulmonary vein and left lower pulmonary vein are normal.   IAS/Shunts: No atrial level shunt detected by color flow Doppler.   Manuel Ripple Hall  Electronically signed by Manuel Ripple Hall  Signature Date/Time: 08/09/2023/4:31:01 PM   Result Date: 08/09/2023 See surgical note for result.  [place] RT LOWER EXTREM Hall SOFT TISSUE NON VASCULAR  Result Date: 08/08/2023 CLINICAL DATA:  Right calf swelling. EXAM: ULTRASOUND RIGHT LOWER EXTREMITY LIMITED TECHNIQUE: Ultrasound examination of the lower extremity soft tissues was performed in the area of clinical concern. COMPARISON:  Bilateral lower extremity deep venous ultrasound 12/10/2020 (negative) FINDINGS: In the area of interest of the  right calf, there is a well-circumscribed small fluid focus that appears to be surrounding a fat lobule, measuring up to approximately 1.1 x 0.5 x 0.7 cm. Mild internal echoes. No internal color flow vascularity. IMPRESSION: In the area of interest of the right calf, there is a small fluid focus that appears to be surrounding a fat lobule, measuring up to approximately 1.1 x 0.5 x 0.7 cm. This is nonspecific and may represent a small region of coalescing edema or small seroma. No increased vascularity is seen to indicate an abscess. Electronically Signed   By: Windy Fast  Viola M.D.   On: 08/08/2023 18:40     I have independently reviewed the above radiologic studies and discussed with the patient   Recent Lab Findings: Lab Results  Component Value Date   WBC 6.3 08/09/2023   HGB 14.3 08/09/2023   HCT 42.2 08/09/2023   PLT 212 08/09/2023   GLUCOSE 94 08/09/2023   CHOL 111 04/09/2021   TRIG 53 04/09/2021   HDL 62 04/09/2021   LDLCALC 38 04/09/2021   ALT 12 08/06/2023   AST 17 08/06/2023   NA 137 08/09/2023   K 3.2 (L) 08/09/2023   CL 101 08/09/2023   CREATININE 2.43 (H) 08/09/2023   BUN 14 08/09/2023   CO2 27 08/09/2023   TSH 1.247 11/19/2020   INR 1.2 08/06/2023   HGBA1C 5.7 (H) 04/08/2021      Assessment / Plan:      - Very pleasant 67yo male with the above described past medical history presented to the ED  on 08/05/23 for evaluation of chest pressure, headache, and myalgias.  Imaging revealed ground glass opacities in the right middle and lower lobes felt to represent CAP.  He was discharged on oral antibiotics after having blood cultures drawn. After 1 of 2 of the blood cultures resulted with methicillin sensitive staph aureus, Mr. Bufford was asked to return to the hospital for admission and further work up. Repeat blood cultures have remained negative at 4 days.  Respiratory culture grew only normal respiratory flora.  MRI of the thoracic and lumbar spine was unrevealing.  Transthoracic echo was unremarkable but was follow up with a TEE yesterday showing a small mass in the RV and a second small mobile lesion on the posterior leaflet of the mitral valve. Biventricular function is preserved and there is only trivial MR with no other valvular abnormality.  Mr. Macho has improved clinically since admission and initiation of IV antibiotics.   There is currently no indication for surgical intervention for these two very small masses described on the echo. Recommend continuing the antibiotics per ID recommendations.   Findings discussed with Dr. Leafy Ro.  He will review the relevant studies and further discussion will follow.     I  spent 30 minutes counseling the patient face to face.  Leary Roca, PA-C  08/10/2023 11:07 AM  I have reviewed the above consult and agree with the above; Pt with second episode of bacteremia and has TEE findings that Hall not have indications for surgical intervention Pt should complete 6 weeks of antibiotics and be reassessed by cardiology to determine if any need for surgical intervention with evidence of mitral valve dysfunction. Discussed with pt

## 2023-08-10 NOTE — Progress Notes (Addendum)
[facility]   Reason for visit: Follow up on mitral valve endocarditis  Interval History: TEE positive for mobile mass on the posterior leaflet of the mitral valve and in the right ventricle.  He is considering SNF vs home   Day 5 total antibiotics Blood cultures 11/30 ngtd   Physical Exam: Constitutional:  Vitals:   08/10/23 0324 08/10/23 0723  BP: (!) 128/97 (!) 157/95  Pulse: 62 64  Resp: 18 18  Temp: 98.8 F (37.1 C) 98.2 F (36.8 C)  SpO2: 95% 96%   patient appears in NAD Respiratory: Normal respiratory effort  Review of Systems: Constitutional: negative for fevers and chills  Lab Results  Component Value Date   WBC 6.3 08/09/2023   HGB 14.3 08/09/2023   HCT 42.2 08/09/2023   MCV 91.3 08/09/2023   PLT 212 08/09/2023    Lab Results  Component Value Date   CREATININE 2.43 (H) 08/09/2023   BUN 14 08/09/2023   NA 137 08/09/2023   K 3.2 (L) 08/09/2023   CL 101 08/09/2023   CO2 27 08/09/2023    Lab Results  Component Value Date   ALT 12 08/06/2023   AST 17 08/06/2023   ALKPHOS 65 08/06/2023     Microbiology: Recent Results (from the past 240 hour(s))  SARS Coronavirus 2 by RT PCR (hospital order, performed in [facility] lab) *cepheid single result test*     Status: None   Collection Time: 08/05/23  1:58 PM   Specimen: Nasal Swab  Result Value Ref Range Status   SARS Coronavirus 2 by RT PCR NEGATIVE NEGATIVE Final    Comment: Performed at [facility], [address], [place], [place] [address]  Culture, blood (routine x 2)     Status: Abnormal   Collection Time: 08/05/23  2:00 PM   Specimen: BLOOD  Result Value Ref Range Status   Specimen Description BLOOD BLOOD LEFT ARM  Final   Special Requests   Final    BOTTLES DRAWN AEROBIC AND ANAEROBIC Blood Culture results may not be optimal due to an inadequate volume of blood received in culture bottles   Culture  Setup Time   Final    GRAM POSITIVE COCCI IN  BOTH AEROBIC AND ANAEROBIC BOTTLES CRITICAL RESULT CALLED TO, READ BACK BY AND VERIFIED WITH: OLDLAND RN 08/06/2023 @ 0703 BY AB Performed at [facility], [address], [place], [place] [address]    Culture STAPHYLOCOCCUS AUREUS STREPTOCOCCUS SPECIES  (A)  Final   Report Status 08/10/2023 FINAL  Final   Organism ID, Bacteria STAPHYLOCOCCUS AUREUS  Final   Organism ID, Bacteria STREPTOCOCCUS SPECIES  Final      Susceptibility   Staphylococcus aureus - MIC*    CIPROFLOXACIN 1 SENSITIVE Sensitive     ERYTHROMYCIN >=8 RESISTANT Resistant     GENTAMICIN <=0.5 SENSITIVE Sensitive     OXACILLIN 0.5 SENSITIVE Sensitive     TETRACYCLINE <=1 SENSITIVE Sensitive     VANCOMYCIN 1 SENSITIVE Sensitive     TRIMETH/SULFA <=10 SENSITIVE Sensitive     CLINDAMYCIN RESISTANT Resistant     RIFAMPIN <=0.5 SENSITIVE Sensitive     Inducible Clindamycin POSITIVE Resistant     LINEZOLID 2 SENSITIVE Sensitive     * STAPHYLOCOCCUS AUREUS   Streptococcus species - MIC*    PENICILLIN 0.5 INTERMEDIATE Intermediate     CEFTRIAXONE 0.25 SENSITIVE Sensitive     ERYTHROMYCIN <=0.12 SENSITIVE Sensitive     LEVOFLOXACIN  1 SENSITIVE Sensitive     VANCOMYCIN 1 SENSITIVE Sensitive     * STREPTOCOCCUS SPECIES  Blood Culture ID Panel (Reflexed)     Status: Abnormal   Collection Time: 08/05/23  2:00 PM  Result Value Ref Range Status   Enterococcus faecalis NOT DETECTED NOT DETECTED Final   Enterococcus Faecium NOT DETECTED NOT DETECTED Final   Listeria monocytogenes NOT DETECTED NOT DETECTED Final   Staphylococcus species DETECTED (A) NOT DETECTED Final    Comment: CRITICAL RESULT CALLED TO, READ BACK BY AND VERIFIED WITH: OLDLAND RN 08/06/2023 @ 0703 BY AB    Staphylococcus aureus (BCID) DETECTED (A) NOT DETECTED Final    Comment: CRITICAL RESULT CALLED TO, READ BACK BY AND VERIFIED WITH: OLDLAND RN 08/06/2023 @ 0703 BY AB    Staphylococcus epidermidis NOT DETECTED NOT DETECTED Final   Staphylococcus  lugdunensis NOT DETECTED NOT DETECTED Final   Streptococcus species NOT DETECTED NOT DETECTED Final   Streptococcus agalactiae NOT DETECTED NOT DETECTED Final   Streptococcus pneumoniae NOT DETECTED NOT DETECTED Final   Streptococcus pyogenes NOT DETECTED NOT DETECTED Final   A.calcoaceticus-baumannii NOT DETECTED NOT DETECTED Final   Bacteroides fragilis NOT DETECTED NOT DETECTED Final   Enterobacterales NOT DETECTED NOT DETECTED Final   Enterobacter cloacae complex NOT DETECTED NOT DETECTED Final   Escherichia coli NOT DETECTED NOT DETECTED Final   Klebsiella aerogenes NOT DETECTED NOT DETECTED Final   Klebsiella oxytoca NOT DETECTED NOT DETECTED Final   Klebsiella pneumoniae NOT DETECTED NOT DETECTED Final   Proteus species NOT DETECTED NOT DETECTED Final   Salmonella species NOT DETECTED NOT DETECTED Final   Serratia marcescens NOT DETECTED NOT DETECTED Final   Haemophilus influenzae NOT DETECTED NOT DETECTED Final   Neisseria meningitidis NOT DETECTED NOT DETECTED Final   Pseudomonas aeruginosa NOT DETECTED NOT DETECTED Final   Stenotrophomonas maltophilia NOT DETECTED NOT DETECTED Final   Candida albicans NOT DETECTED NOT DETECTED Final   Candida auris NOT DETECTED NOT DETECTED Final   Candida glabrata NOT DETECTED NOT DETECTED Final   Candida krusei NOT DETECTED NOT DETECTED Final   Candida parapsilosis NOT DETECTED NOT DETECTED Final   Candida tropicalis NOT DETECTED NOT DETECTED Final   Cryptococcus neoformans/gattii NOT DETECTED NOT DETECTED Final   Meth resistant mecA/C and MREJ NOT DETECTED NOT DETECTED Final    Comment: Performed at [facility], [address], [place], [place] [address]  Culture, blood (routine x 2)     Status: None   Collection Time: 08/05/23  2:12 PM   Specimen: BLOOD  Result Value Ref Range Status   Specimen Description BLOOD BLOOD RIGHT ARM  Final   Special Requests   Final    BOTTLES DRAWN AEROBIC AND ANAEROBIC Blood Culture results may  not be optimal due to an inadequate volume of blood received in culture bottles   Culture   Final    NO GROWTH 5 DAYS Performed at [facility], [address], [place], [place] [address]    Report Status 08/10/2023 FINAL  Final  Blood Culture (routine x 2)     Status: None (Preliminary result)   Collection Time: 08/06/23 11:38 AM   Specimen: BLOOD LEFT HAND  Result Value Ref Range Status   Specimen Description BLOOD LEFT HAND  Final   Special Requests   Final    BOTTLES DRAWN AEROBIC AND ANAEROBIC Blood Culture results may not be optimal due to an inadequate volume of blood received in culture bottles  Culture   Final    NO GROWTH 4 DAYS Performed at [facility], [address], [place], [place] [address]    Report Status PENDING  Incomplete  Blood Culture (routine x 2)     Status: None (Preliminary result)   Collection Time: 08/06/23 11:43 AM   Specimen: BLOOD  Result Value Ref Range Status   Specimen Description BLOOD RIGHT ANTECUBITAL  Final   Special Requests   Final    BOTTLES DRAWN AEROBIC AND ANAEROBIC Blood Culture adequate volume   Culture   Final    NO GROWTH 4 DAYS Performed at [facility], [address] [place], [place] [address]    Report Status PENDING  Incomplete  Respiratory (~20 pathogens) panel by PCR     Status: None   Collection Time: 08/07/23  5:50 AM   Specimen: Nasopharyngeal Swab; Respiratory  Result Value Ref Range Status   Adenovirus NOT DETECTED NOT DETECTED Final   Coronavirus 229E NOT DETECTED NOT DETECTED Final    Comment: (NOTE) The Coronavirus on the Respiratory Panel, DOES NOT test for the novel  Coronavirus (2019 nCoV)    Coronavirus HKU1 NOT DETECTED NOT DETECTED Final   Coronavirus NL63 NOT DETECTED NOT DETECTED Final   Coronavirus OC43 NOT DETECTED NOT DETECTED Final   Metapneumovirus NOT DETECTED NOT DETECTED Final   Rhinovirus / Enterovirus NOT DETECTED NOT DETECTED Final   Influenza A NOT DETECTED NOT  DETECTED Final   Influenza B NOT DETECTED NOT DETECTED Final   Parainfluenza Virus 1 NOT DETECTED NOT DETECTED Final   Parainfluenza Virus 2 NOT DETECTED NOT DETECTED Final   Parainfluenza Virus 3 NOT DETECTED NOT DETECTED Final   Parainfluenza Virus 4 NOT DETECTED NOT DETECTED Final   Respiratory Syncytial Virus NOT DETECTED NOT DETECTED Final   Bordetella pertussis NOT DETECTED NOT DETECTED Final   Bordetella Parapertussis NOT DETECTED NOT DETECTED Final   Chlamydophila pneumoniae NOT DETECTED NOT DETECTED Final   Mycoplasma pneumoniae NOT DETECTED NOT DETECTED Final    Comment: Performed at [facility], [address] [place], [place] [address]  Culture, Respiratory w Gram Stain     Status: None   Collection Time: 08/07/23  7:36 AM   Specimen: SPU; Respiratory  Result Value Ref Range Status   Specimen Description SPUTUM  Final   Special Requests NONE  Final   Gram Stain   Final    MODERATE WBC PRESENT, PREDOMINANTLY PMN FEW GRAM POSITIVE COCCI RARE GRAM POSITIVE RODS    Culture   Final    MODERATE Normal respiratory flora-no Staph aureus or Pseudomonas seen Performed at [facility], [address] [place], [place] [address]    Report Status 08/09/2023 FINAL  Final    Impression/Plan:  1. Mitral valve endocarditis - he has a mobile mass on the MV and ventricle that is most c/w infective endocarditis.   MSSA on the culture and he is on cefazolin and will get a prolonged course.   He should also have cardiology involvement with the MV endocarditis for monitoring.  This can be as an outpatient from my perspective.    2.  Access - will order picc line.  Normal creat relatively stable  so ok for picc line from ID standpoint.    3.  OPAT:  Diagnosis: MV infective endocarditis  Culture Result: MSSA  Allergies  Allergen Reactions   Codeine Nausea And Vomiting   Hydrochlorothiazide Other (See Comments)  Unknown per Pt.    Hydrocodone-Acetaminophen Nausea  And Vomiting   Hydromorphone Other (See Comments)    hallucinations     OPAT Orders Discharge antibiotics to be given via PICC line Discharge antibiotics: cefazolin 2 grams IV every 8 hours Per pharmacy protocol yes Duration: 6 weeks End Date: 09/17/23  [facility] Care Per Protocol: yes  Home health RN for IV administration and teaching; PICC line care and labs.    Labs weekly while on IV antibiotics: _x_ CBC with differential __ BMP _x_ CMP __ CRP __ ESR __ Vancomycin trough __ CK  _x_ Please pull PIC at completion of IV antibiotics __ Please leave PIC in place until doctor has seen patient or been notified  Fax weekly labs to [phone]  Clinic Follow Up Appt: 09/08/23  @ 11:15

## 2023-08-10 NOTE — Assessment & Plan Note (Signed)
08-10-2023 H/o cardiac arrest 2002. Continue aspirin, statin.

## 2023-08-10 NOTE — Assessment & Plan Note (Signed)
08-10-2023 continue with lipitor 20 mg qday

## 2023-08-10 NOTE — Progress Notes (Signed)
PROGRESS NOTE    [name]  ION:[id number] DOB: [birth year] DOA: 08/06/2023 PCP: Creola Corn, MD  Subjective: Pt seen and examined. Discussed pt's TEE findings. Discussed need for CT surgery consult, cardiology consult and RHC/LHC.   Hospital Course: HPI: [name] is a 67 y.o. male with medical history significant of chronic HFpEF, hypertension, CAD, OSA intolerant to CPAP, hyperlipidemia, history of cardiac arrest (2002), history of PE (completed Xarelto therapy), history of CVA, sarcoidosis, stage III CKD, prior history of MSSA bacteremia (11/2020) presenting to the ED after having positive blood cultures for MSSA.   Patient reports that he was experiencing headaches, cough, congestion, chills starting this past Friday morning.  He also experienced some chest pain.  Wife checked his blood pressure and found that it was elevated and thus subsequently called EMS to bring him to the hospital yesterday.  EMS gave him aspirin and placed him on oxygen which relieved his chest pain.  Workup in the ED did not reveal any ACS.  CT angio chest abdomen pelvis did not reveal any PE but did show likely right middle lobe and right lower lobe pneumonia.  He was subsequently sent home with oral doxycycline.  Blood cultures drawn yesterday returned positive for MSSA today and patient received a call from ID advising him to return to the ED.   Today, he notes that his previous symptoms are somewhat better although they are still present.  He has no longer having a headache or chest pain.  He does continue to have cough productive of yellow/brown sputum although amount of sputum has improved since yesterday.  He does continue to endorse some congestion and chills.  Denies any fevers, shortness of breath, palpitations, chest pain, abdominal pain, urinary changes, weakness.  He denies any recent sick contacts although he was recently together with family for Thanksgiving.   ED course: CBC and CMP fairly  unremarkable, kidney function is around his baseline of CKD 3 (baseline EGFR 45-50).  Lactic acid negative x 2.  Chest x-ray revealed stable bronchopneumonia at lung bases.  Blood cultures redrawn today x 2, pending.  [facility] hospitalist asked to evaluate patient for admission.  Significant Events: Admitted 08/06/2023 for MSSA bacteremia, CAP   Significant Labs: 08-05-2023 blood cx growing MSSA Admission WBC 10.4, BUN 10, Scr 1.63  Significant Imaging Studies: CTA chest/abd/pelvis showed NO dissection. Showed RML/RLL/LUL/lingula ground glass opacities. 3 vessel CAD 08-06-2023 MRI T/L spine no acute infection 08-09-2023 TEE showed mobile mass in RV. Either on chordae or RV wall. Also fibrinous mobile mass of mitral valve  Antibiotic Therapy: Anti-infectives (From admission, onward)    Start     Dose/Rate Route Frequency Ordered Stop   08/07/23 1400  cefTRIAXone (ROCEPHIN) 1 g in sodium chloride 0.9 % 100 mL IVPB  Status:  Discontinued        1 g 200 mL/hr over 30 Minutes Intravenous Every 24 hours 08/06/23 1534 08/06/23 1741   08/07/23 1400  ceFAZolin (ANCEF) IVPB 2g/100 mL premix        2 g 200 mL/hr over 30 Minutes Intravenous Every 8 hours 08/06/23 1741     08/06/23 2200  doxycycline (VIBRA-TABS) tablet 100 mg  Status:  Discontinued        100 mg Oral Every 12 hours 08/06/23 1534 08/07/23 1842   08/06/23 1315  cefTRIAXone (ROCEPHIN) 1 g in sodium chloride 0.9 % 100 mL IVPB  Status:  Discontinued        1 g 200 mL/hr over  30 Minutes Intravenous  Once 08/06/23 1311 08/06/23 1312   08/06/23 1315  cefTRIAXone (ROCEPHIN) 2 g in sodium chloride 0.9 % 100 mL IVPB        2 g 200 mL/hr over 30 Minutes Intravenous  Once 08/06/23 1312 08/06/23 1411   08/06/23 1145  cefTRIAXone (ROCEPHIN) 2 g in sodium chloride 0.9 % 100 mL IVPB  Status:  Discontinued        2 g 200 mL/hr over 30 Minutes Intravenous Every 24 hours 08/06/23 1141 08/06/23 1143   08/06/23 1145  doxycycline (VIBRA-TABS) tablet 100  mg        100 mg Oral  Once 08/06/23 1143 08/06/23 1214       Procedures: 08-09-2023 TEE showed mobile mass in RV. Either on chordae or RV wall. Also fibrinous mobile mass of mitral valve  Consultants: ID    Assessment and Plan: * Bacteremia due to Staphylococcus aureus Since admission until 08-09-2023 Blood culture sent this admission it is also showing gram-positive cocci Currently on cefazolin a per ID recommendation. Surface echo did not show any evidence of vegetation.  Underwent TEE today.  Pending report MRI thoracic and lumbar did not show any evidence of active infection. Currently on Mucinex twice daily for cough Currently not on supplemental oxygen.  Monitor 08-10-2023 blood cx grew MSSA. On IV Ancef per ID.  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 Also has mobile mass (0.5 cm x 0.8 cm )in the right ventricle suspected either on the chordae or the wall of the ventricle. Will need CT surgery consult.  CT surgery has requested cards consult for RHC/LHC.  Community acquired pneumonia 08-10-2023 remains on IV Ancef  History of pulmonary embolism 08-10-2023 not on systemic anticoagulation.  HLD (hyperlipidemia) 08-10-2023 continue with lipitor 20 mg qday  Coronary artery disease involving native coronary artery of native heart without angina pectoris 08-10-2023 H/o cardiac arrest 2002. Continue aspirin, statin.  (HFpEF) heart failure with preserved ejection fraction (HCC) Since admission until 08-09-2023 Bilateral pedal edema  Last ECHO with LVEF 55-60% with G1DD. Follows up with [facility] cardiology PTA meds- Toprol XL 200 mg daily, Cardizem 360 mg daily, torsemide 20mg  daily, entresto 97-103mg  BID, imdur 90mg  daily, farxiga 10mg  daily, hydralazine 100 mg twice daily All of those were continued.  With AKI today, pt's torsemide, Entresto and Farxiga were held.  Okay to continue Toprol, Cardizem and hydralazine.  Prolonged  QT interval Since admission until 08-09-2023 QT 560 / Qtc 592.  Likely due to low electrolytes avoid QT prolonging medications as able Electrolytes replaced as above Repeat EKG 12/2 showed QTc improved to 446 ms 08-10-2023 stable.  OSA (obstructive sleep apnea) 08-10-2023 intolerant to CPAP   Stage 3b chronic kidney disease - baseline SCr 1.6-1.9 Since admission until 08-09-2023 Creatinine at baseline less than 1.5.  In the last 24 hours, creatinine elevated to 2.43 Start normal saline at 75 mL/h Hold torsemide, [place] Custard 08-10-2023 Scr 1.77 today.  Back at baseline 1.6-1.9. continue to hold diuretics for now. Stop IVF  Essential hypertension Since admission until 08-09-2023 Bilateral pedal edema  Last ECHO with LVEF 55-60% with G1DD. Follows up with [facility] cardiology PTA meds- Toprol XL 200 mg daily, Cardizem 360 mg daily, torsemide 20mg  daily, entresto 97-103mg  BID, imdur 90mg  daily, farxiga 10mg  daily, hydralazine 100 mg twice daily All of those were continued.  With AKI today, pt's torsemide, Entresto and Farxiga were held.  Okay to continue Toprol, Cardizem and hydralazine.  08-10-2023 BP acceptable on Toprol-XL 200 mg every day, cardizem-CD 360 mg every day, hydralazine 100 mg bid.  BPH (benign prostatic hyperplasia) 08-10-2023 continue with flomax 0.4 mg qday  Hypokalemia 08-10-2023 replete with po kcl.   DVT prophylaxis: heparin injection 5,000 Units Start: 08/09/23 1000    Code Status: Full Code Family Communication: discussed with pt. No family at bedside. Pt is decisional. Disposition Plan: return home Reason for continuing need for hospitalization: needs Cards consult. CT surgery requesting RHC/LHC.  Objective: Vitals:   08/09/23 2203 08/10/23 0324 08/10/23 0334 08/10/23 0723  BP: 128/83 (!) 128/97  (!) 157/95  Pulse:  62  64  Resp:  18  18  Temp:  98.8 F (37.1 C)  98.2 F (36.8 C)  TempSrc:  Oral  Oral  SpO2: 95% 95%  96%  Weight:   97.8 kg   Height:         Intake/Output Summary (Last 24 hours) at 08/10/2023 1249 Last data filed at 08/10/2023 1009 Gross per 24 hour  Intake 1897.18 ml  Output 200 ml  Net 1697.18 ml   Filed Weights   08/08/23 0414 08/09/23 0602 08/10/23 0334  Weight: 99.2 kg 97.9 kg 97.8 kg    Examination:  Physical Exam Vitals and nursing note reviewed.  Constitutional:      General: He is not in acute distress.    Appearance: He is obese. He is not toxic-appearing or diaphoretic.  HENT:     Head: Normocephalic and atraumatic.     Nose: Nose normal.  Cardiovascular:     Rate and Rhythm: Normal rate and regular rhythm.     Pulses: Normal pulses.     Heart sounds: Murmur heard.     Comments: LLSB SEM Pulmonary:     Effort: No respiratory distress.     Breath sounds: No wheezing.  Abdominal:     General: Bowel sounds are normal. There is no distension.     Palpations: Abdomen is soft.     Tenderness: There is no abdominal tenderness.  Musculoskeletal:     Comments: Trace pretibial edema  Skin:    General: Skin is warm and dry.     Capillary Refill: Capillary refill takes less than 2 seconds.  Neurological:     General: No focal deficit present.     Mental Status: He is alert and oriented to person, place, and time.     Data Reviewed: I have personally reviewed following labs and imaging studies  CBC: Recent Labs  Lab 08/05/23 1400 08/05/23 1406 08/05/23 1439 08/06/23 1218 08/07/23 0523 08/09/23 0541  WBC 11.4*  --   --  10.4 6.9 6.3  NEUTROABS 9.4*  --   --  8.2*  --  3.6  HGB 12.3* 10.9* 13.6 13.7 12.9* 14.3  HCT 36.6* 32.0* 40.0 40.5 38.7* 42.2  MCV 95.6  --   --  92.3 92.1 91.3  PLT 156  --   --  192 176 212   Basic Metabolic Panel: Recent Labs  Lab 08/05/23 1400 08/05/23 1406 08/05/23 1439 08/06/23 1218 08/07/23 0523 08/07/23 0916 08/09/23 0541 08/10/23 1055  NA 143   < > 144 140 140  --  137 139  K 2.7*   < > 3.3* 3.9 3.4*  --  3.2* 3.2*  CL 117*  --  106 109 108  --   101 107  CO2 17*  --   --  25 26  --  27 25  GLUCOSE 92  --  108* 112* 98  --  94 128*  BUN 10  --  11 10 9   --  14 12  CREATININE 1.33*  --  1.50* 1.63* 1.63*  --  2.43* 1.77*  CALCIUM 6.9*  --   --  9.1 8.9  --  9.2 8.7*  MG 1.6*  --   --   --   --  2.1  --   --   PHOS  --   --   --   --   --  2.6  --   --    < > = values in this interval not displayed.   GFR: Estimated Creatinine Clearance: 49.8 mL/min (A) (by C-G formula based on SCr of 1.77 mg/dL (H)). Liver Function Tests: Recent Labs  Lab 08/05/23 1400 08/06/23 1218  AST 14* 17  ALT 10 12  ALKPHOS 59 65  BILITOT 0.7 0.8  PROT 5.0* 6.6  ALBUMIN 2.7* 3.4*   Coagulation Profile: Recent Labs  Lab 08/05/23 1400 08/06/23 1218  INR 1.2 1.2   BNP (last 3 results) Recent Labs    05/17/23 0801 08/05/23 1400  BNP 507.0* 1,648.2*   CBG: Recent Labs  Lab 08/07/23 0559 08/08/23 0729 08/09/23 0618 08/10/23 0601  GLUCAP 107* 129* 103* 107*   Sepsis Labs: Recent Labs  Lab 08/05/23 1406 08/05/23 1601 08/06/23 1226 08/06/23 1342  LATICACIDVEN 0.7 <0.3* 0.6 0.6    Recent Results (from the past 240 hour(s))  SARS Coronavirus 2 by RT PCR (hospital order, performed in [facility] hospital lab) *cepheid single result test*     Status: None   Collection Time: 08/05/23  1:58 PM   Specimen: Nasal Swab  Result Value Ref Range Status   SARS Coronavirus 2 by RT PCR NEGATIVE NEGATIVE Final    Comment: Performed at [facility] Lab, [address], [place], [place] [address]  Culture, blood (routine x 2)     Status: Abnormal   Collection Time: 08/05/23  2:00 PM   Specimen: BLOOD  Result Value Ref Range Status   Specimen Description BLOOD BLOOD LEFT ARM  Final   Special Requests   Final    BOTTLES DRAWN AEROBIC AND ANAEROBIC Blood Culture results may not be optimal due to an inadequate volume of blood received in culture bottles   Culture  Setup Time   Final    GRAM POSITIVE COCCI IN BOTH AEROBIC AND ANAEROBIC  BOTTLES CRITICAL RESULT CALLED TO, READ BACK BY AND VERIFIED WITH: OLDLAND RN 08/06/2023 @ 0703 BY AB Performed at [facility], [address], [place], [place] [address]    Culture STAPHYLOCOCCUS AUREUS STREPTOCOCCUS SPECIES  (A)  Final   Report Status 08/10/2023 FINAL  Final   Organism ID, Bacteria STAPHYLOCOCCUS AUREUS  Final   Organism ID, Bacteria STREPTOCOCCUS SPECIES  Final      Susceptibility   Staphylococcus aureus - MIC*    CIPROFLOXACIN 1 SENSITIVE Sensitive     ERYTHROMYCIN >=8 RESISTANT Resistant     GENTAMICIN <=0.5 SENSITIVE Sensitive     OXACILLIN 0.5 SENSITIVE Sensitive     TETRACYCLINE <=1 SENSITIVE Sensitive     VANCOMYCIN 1 SENSITIVE Sensitive     TRIMETH/SULFA <=10 SENSITIVE Sensitive     CLINDAMYCIN RESISTANT Resistant     RIFAMPIN <=0.5 SENSITIVE Sensitive     Inducible Clindamycin POSITIVE Resistant     LINEZOLID 2 SENSITIVE Sensitive     * STAPHYLOCOCCUS AUREUS   Streptococcus species - MIC*  PENICILLIN 0.5 INTERMEDIATE Intermediate     CEFTRIAXONE 0.25 SENSITIVE Sensitive     ERYTHROMYCIN <=0.12 SENSITIVE Sensitive     LEVOFLOXACIN 1 SENSITIVE Sensitive     VANCOMYCIN 1 SENSITIVE Sensitive     * STREPTOCOCCUS SPECIES  Blood Culture ID Panel (Reflexed)     Status: Abnormal   Collection Time: 08/05/23  2:00 PM  Result Value Ref Range Status   Enterococcus faecalis NOT DETECTED NOT DETECTED Final   Enterococcus Faecium NOT DETECTED NOT DETECTED Final   Listeria monocytogenes NOT DETECTED NOT DETECTED Final   Staphylococcus species DETECTED (A) NOT DETECTED Final    Comment: CRITICAL RESULT CALLED TO, READ BACK BY AND VERIFIED WITH: OLDLAND RN 08/06/2023 @ 0703 BY AB    Staphylococcus aureus (BCID) DETECTED (A) NOT DETECTED Final    Comment: CRITICAL RESULT CALLED TO, READ BACK BY AND VERIFIED WITH: OLDLAND RN 08/06/2023 @ 0703 BY AB    Staphylococcus epidermidis NOT DETECTED NOT DETECTED Final   Staphylococcus lugdunensis NOT DETECTED NOT  DETECTED Final   Streptococcus species NOT DETECTED NOT DETECTED Final   Streptococcus agalactiae NOT DETECTED NOT DETECTED Final   Streptococcus pneumoniae NOT DETECTED NOT DETECTED Final   Streptococcus pyogenes NOT DETECTED NOT DETECTED Final   A.calcoaceticus-baumannii NOT DETECTED NOT DETECTED Final   Bacteroides fragilis NOT DETECTED NOT DETECTED Final   Enterobacterales NOT DETECTED NOT DETECTED Final   Enterobacter cloacae complex NOT DETECTED NOT DETECTED Final   Escherichia coli NOT DETECTED NOT DETECTED Final   Klebsiella aerogenes NOT DETECTED NOT DETECTED Final   Klebsiella oxytoca NOT DETECTED NOT DETECTED Final   Klebsiella pneumoniae NOT DETECTED NOT DETECTED Final   Proteus species NOT DETECTED NOT DETECTED Final   Salmonella species NOT DETECTED NOT DETECTED Final   Serratia marcescens NOT DETECTED NOT DETECTED Final   Haemophilus influenzae NOT DETECTED NOT DETECTED Final   Neisseria meningitidis NOT DETECTED NOT DETECTED Final   Pseudomonas aeruginosa NOT DETECTED NOT DETECTED Final   Stenotrophomonas maltophilia NOT DETECTED NOT DETECTED Final   Candida albicans NOT DETECTED NOT DETECTED Final   Candida auris NOT DETECTED NOT DETECTED Final   Candida glabrata NOT DETECTED NOT DETECTED Final   Candida krusei NOT DETECTED NOT DETECTED Final   Candida parapsilosis NOT DETECTED NOT DETECTED Final   Candida tropicalis NOT DETECTED NOT DETECTED Final   Cryptococcus neoformans/gattii NOT DETECTED NOT DETECTED Final   Meth resistant mecA/C and MREJ NOT DETECTED NOT DETECTED Final    Comment: Performed at [facility], [address] [place], [place] [address]  Culture, blood (routine x 2)     Status: None   Collection Time: 08/05/23  2:12 PM   Specimen: BLOOD  Result Value Ref Range Status   Specimen Description BLOOD BLOOD RIGHT ARM  Final   Special Requests   Final    BOTTLES DRAWN AEROBIC AND ANAEROBIC Blood Culture results may not be optimal due to an  inadequate volume of blood received in culture bottles   Culture   Final    NO GROWTH 5 DAYS Performed at [facility], [address], [place], [place] [address]    Report Status 08/10/2023 FINAL  Final  Blood Culture (routine x 2)     Status: None (Preliminary result)   Collection Time: 08/06/23 11:38 AM   Specimen: BLOOD LEFT HAND  Result Value Ref Range Status   Specimen Description BLOOD LEFT HAND  Final   Special Requests   Final  BOTTLES DRAWN AEROBIC AND ANAEROBIC Blood Culture results may not be optimal due to an inadequate volume of blood received in culture bottles   Culture   Final    NO GROWTH 4 DAYS Performed at [facility], [address], [place], [place] [address]    Report Status PENDING  Incomplete  Blood Culture (routine x 2)     Status: None (Preliminary result)   Collection Time: 08/06/23 11:43 AM   Specimen: BLOOD  Result Value Ref Range Status   Specimen Description BLOOD RIGHT ANTECUBITAL  Final   Special Requests   Final    BOTTLES DRAWN AEROBIC AND ANAEROBIC Blood Culture adequate volume   Culture   Final    NO GROWTH 4 DAYS Performed at [facility], [address], [place], [place] [address]    Report Status PENDING  Incomplete  Respiratory (~20 pathogens) panel by PCR     Status: None   Collection Time: 08/07/23  5:50 AM   Specimen: Nasopharyngeal Swab; Respiratory  Result Value Ref Range Status   Adenovirus NOT DETECTED NOT DETECTED Final   Coronavirus 229E NOT DETECTED NOT DETECTED Final    Comment: (NOTE) The Coronavirus on the Respiratory Panel, DOES NOT test for the novel  Coronavirus (2019 nCoV)    Coronavirus HKU1 NOT DETECTED NOT DETECTED Final   Coronavirus NL63 NOT DETECTED NOT DETECTED Final   Coronavirus OC43 NOT DETECTED NOT DETECTED Final   Metapneumovirus NOT DETECTED NOT DETECTED Final   Rhinovirus / Enterovirus NOT DETECTED NOT DETECTED Final   Influenza A NOT DETECTED NOT DETECTED Final   Influenza B  NOT DETECTED NOT DETECTED Final   Parainfluenza Virus 1 NOT DETECTED NOT DETECTED Final   Parainfluenza Virus 2 NOT DETECTED NOT DETECTED Final   Parainfluenza Virus 3 NOT DETECTED NOT DETECTED Final   Parainfluenza Virus 4 NOT DETECTED NOT DETECTED Final   Respiratory Syncytial Virus NOT DETECTED NOT DETECTED Final   Bordetella pertussis NOT DETECTED NOT DETECTED Final   Bordetella Parapertussis NOT DETECTED NOT DETECTED Final   Chlamydophila pneumoniae NOT DETECTED NOT DETECTED Final   Mycoplasma pneumoniae NOT DETECTED NOT DETECTED Final    Comment: Performed at [facility], [address] [place], [place] [address]  Culture, Respiratory w Gram Stain     Status: None   Collection Time: 08/07/23  7:36 AM   Specimen: SPU; Respiratory  Result Value Ref Range Status   Specimen Description SPUTUM  Final   Special Requests NONE  Final   Gram Stain   Final    MODERATE WBC PRESENT, PREDOMINANTLY PMN FEW GRAM POSITIVE COCCI RARE GRAM POSITIVE RODS    Culture   Final    MODERATE Normal respiratory flora-no Staph aureus or Pseudomonas seen Performed at [facility], [address] [place], [place] [address]    Report Status 08/09/2023 FINAL  Final     Radiology Studies: [place] EKG SITE RITE  Result Date: 08/10/2023 If Site Rite image not attached, placement could not be confirmed due to current cardiac rhythm.  ECHO TEE  Result Date: 08/09/2023    TRANSESOPHOGEAL ECHO REPORT   Patient Name:   KINGSLEE ZALES Date of Exam: 08/09/2023 Medical Rec #:  [id number]       Height:       72.0 in Accession #:    [id number]      Weight:       215.8 lb Date of Birth:  Oct 09, 1955  BSA:          2.201 m Patient Age:    67 years        BP:           153/95 mmHg Patient Gender: M               HR:           75 bpm. Exam Location:  Inpatient Procedure: Transesophageal Echo, Cardiac Doppler and Color Doppler Indications:     Endocarditis  History:         Patient has prior history of  Echocardiogram examinations, most                  recent 08/07/2023. CHF; Risk Factors:Hypertension and Sleep                  Apnea.  Sonographer:     Darlys Gales Referring Phys:  [id number] Cyndi Bender Diagnosing Phys: Thomasene Ripple DO PROCEDURE: After discussion of the risks and benefits of a TEE, an informed consent was obtained from the patient. The transesophogeal probe was passed without difficulty through the esophogus of the patient. Sedation performed by different physician. The patient was monitored while under deep sedation. Anesthestetic sedation was provided intravenously by Anesthesiology: 470mg  of Propofol. The patient developed no complications during the procedure.  IMPRESSIONS  1. Left ventricular ejection fraction, by estimation, is 60 to 65%. The left ventricle has normal function.  2. There is a mobile mass (0.5 cm x 0.8 cm )in the right ventricle suspected either on the chordae or the wall of the ventricle. Right ventricular systolic function is normal. The right ventricular size is normal.  3. No left atrial/left atrial appendage thrombus was detected. The LAA emptying velocity was 104 cm/s.  4. There is fibrinous mobile mass of the posterior leaflet of the mitral valve.. The mitral valve is abnormal. Trivial mitral valve regurgitation. No evidence of mitral stenosis.  5. The aortic valve is normal in structure. There is moderate thickening of the aortic valve. Aortic valve regurgitation is not visualized. No aortic stenosis is present.  6. There is borderline dilatation of the aortic root, measuring 38 mm. FINDINGS  Left Ventricle: Left ventricular ejection fraction, by estimation, is 60 to 65%. The left ventricle has normal function. The left ventricular internal cavity size was normal in size. Right Ventricle: There is a mobile mass (0.5 cm x 0.8 cm )in the right ventricle suspected either on the chordae or the wall of the ventricle. The right ventricular size is normal. Right vetricular wall  thickness was not well visualized. Right ventricular systolic function is normal. Left Atrium: Left atrial size was not well visualized. No left atrial/left atrial appendage thrombus was detected. The LAA emptying velocity was 104 cm/s. Right Atrium: Right atrial size was not well visualized. Pericardium: Trivial pericardial effusion is present. The pericardial effusion is anterior to the right ventricle. Mitral Valve: There is fibrinous mobile mass of the posterior leaflet of the mitral valve. The mitral valve is abnormal. There is mild thickening of the mitral valve leaflet(s). Trivial mitral valve regurgitation. No evidence of mitral valve stenosis. Tricuspid Valve: The tricuspid valve is grossly normal. Tricuspid valve regurgitation is mild . No evidence of tricuspid stenosis. Aortic Valve: The aortic valve is normal in structure. There is moderate thickening of the aortic valve. Aortic valve regurgitation is not visualized. No aortic stenosis is present. Pulmonic Valve: The pulmonic valve was not well visualized. Pulmonic valve regurgitation  is trivial. Aorta: There is borderline dilatation of the aortic root, measuring 38 mm. Venous: The right upper pulmonary vein, right lower pulmonary vein, left upper pulmonary vein and left lower pulmonary vein are normal. IAS/Shunts: No atrial level shunt detected by color flow Doppler. Thomasene Ripple DO Electronically signed by Thomasene Ripple DO Signature Date/Time: 08/09/2023/4:31:01 PM    Final    EP STUDY  Result Date: 08/09/2023 See surgical note for result.  [place] RT LOWER EXTREM LTD SOFT TISSUE NON VASCULAR  Result Date: 08/08/2023 CLINICAL DATA:  Right calf swelling. EXAM: ULTRASOUND RIGHT LOWER EXTREMITY LIMITED TECHNIQUE: Ultrasound examination of the lower extremity soft tissues was performed in the area of clinical concern. COMPARISON:  Bilateral lower extremity deep venous ultrasound 12/10/2020 (negative) FINDINGS: In the area of interest of the right calf,  there is a well-circumscribed small fluid focus that appears to be surrounding a fat lobule, measuring up to approximately 1.1 x 0.5 x 0.7 cm. Mild internal echoes. No internal color flow vascularity. IMPRESSION: In the area of interest of the right calf, there is a small fluid focus that appears to be surrounding a fat lobule, measuring up to approximately 1.1 x 0.5 x 0.7 cm. This is nonspecific and may represent a small region of coalescing edema or small seroma. No increased vascularity is seen to indicate an abscess. Electronically Signed   By: Neita Garnet M.D.   On: 08/08/2023 18:40    Scheduled Meds:  aspirin EC  81 mg Oral Daily   atorvastatin  20 mg Oral Daily   dextromethorphan-guaiFENesin  1 tablet Oral BID   diltiazem  360 mg Oral Daily   heparin injection (subcutaneous)  5,000 Units Subcutaneous Q12H   hydrALAZINE  100 mg Oral BID   isosorbide mononitrate  90 mg Oral Daily   metoprolol  200 mg Oral Daily   potassium chloride  40 mEq Oral Once   sodium chloride flush  10 mL Intravenous Q12H   tamsulosin  0.4 mg Oral QPC breakfast   Continuous Infusions:   ceFAZolin (ANCEF) IV Stopped (08/10/23 0540)     LOS: 4 days   Time spent: 40 minutes  Carollee Herter, DO  [facility]  08/10/2023, 12:49 PM

## 2023-08-10 NOTE — TOC Initial Note (Signed)
Transition of Care [facility]) - Initial/Assessment Note    Patient Details  Name: [name] MRN: [id number] Date of Birth: 05-29-1956  Transition of Care [facility] CM/SW Contact:    Janae Bridgeman, RN Phone Number: 08/10/2023, 10:50 AM  Clinical Narrative:                 CM met with the patient at the bedside to discuss TOC needs for home including PICC line placement today and need for home IV antibx.  The patient states that he has history of IV antibx for home and is familiar with home infusion needs.    I spoke with the patient and offered Medicare choice regarding home health services and the patient chose [facility] first and [facility] second.  I called and left a message with Artavia, RNCM with [facility].  HH orders placed to be co-signed by MD.  I spoke with Jeri Modena, RNCM with [employer] and she is aware that patient will need VIV anitbx and teaching prior to discharge to home.  Patient is pending consult by cardiac surgery team and likely need for RHC/LHC post consult.  OPAT will be signed by the MD.  [facility] Team will continue to follow the patient for TOC needs.        Patient Goals and CMS Choice            Expected Discharge Plan and Services                                              Prior Living Arrangements/Services                       Activities of Daily Living   ADL Screening (condition at time of admission) Independently performs ADLs?: Yes (appropriate for developmental age) Is the patient deaf or have difficulty hearing?: No Does the patient have difficulty seeing, even when wearing glasses/contacts?: No Does the patient have difficulty concentrating, remembering, or making decisions?: No  Permission Sought/Granted                  Emotional Assessment              Admission diagnosis:  Bacteremia [R78.81] Bacteremia due to Staphylococcus aureus [R78.81, B95.61] Community acquired pneumonia, unspecified  laterality [J18.9] Patient Active Problem List   Diagnosis Date Noted   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   Bacteremia due to Staphylococcus aureus 08/06/2023   Community acquired pneumonia 08/06/2023   History of pulmonary embolism 05/17/2023   Dermatochalasia 03/04/2023   Brow ptosis, left 03/04/2023   Acquired mechanical ptosis of left eyelid 03/04/2023   HLD (hyperlipidemia) 06/24/2022   GERD without esophagitis 01/21/2022   Coronary artery disease involving native coronary artery of native heart without angina pectoris 01/21/2022   Intolerance of continuous positive airway pressure (CPAP) ventilation 08/24/2021   (HFpEF) heart failure with preserved ejection fraction (HCC) 05/11/2021   Prolonged QT interval 04/17/2021   Noncompliance    Chronic diastolic CHF (congestive heart failure) (HCC) 03/22/2021   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   Chronic intermittent hypoxia with obstructive sleep apnea 12/22/2020   Mild cognitive impairment 12/22/2020   Acute combined systolic and diastolic congestive heart failure (HCC) 12/22/2020   Rotator cuff arthropathy, right 12/04/2020   Cranial neuropathy    Chronic right shoulder pain 06/18/2020  OSA (obstructive sleep apnea) 05/05/2020   Hypersomnia with sleep apnea 12/20/2019   Psychophysiological insomnia 09/18/2019   Nocturia more than twice per night 09/18/2019   Renal hypertension 09/18/2019   Moderate asthma 09/18/2019   Tobacco abuse 09/18/2019   Non-restorative sleep 09/18/2019   Nontraumatic complete tear of right rotator cuff 08/15/2019   Stage 3b chronic kidney disease - baseline SCr 1.6-1.9    Cerebral infarction (HCC)    Pseudarthrosis 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 02/28/2019   Intervertebral disc disorder with radiculopathy of lumbar region 09/13/2017   Acute gouty arthritis 03/07/2017   Arthralgia of left foot 03/07/2017   Nephrolithiasis    Essential hypertension 02/07/2015    Sarcoidosis 01/30/2013   SHOULDER PAIN, LEFT 11/28/2009   PCP:  Creola Corn, MD Pharmacy:   [facility] [address] [place] [address] Phone: [phone] Fax: [phone]     Social Determinants of Health (SDOH) Social History: SDOH Screenings   Food Insecurity: No Food Insecurity (08/06/2023)  Housing: Low Risk  (08/06/2023)  Transportation Needs: No Transportation Needs (08/06/2023)  Utilities: Not At Risk (08/06/2023)  Depression (PHQ2-9): Low Risk  (01/22/2021)  Financial Resource Strain: Low Risk  (04/21/2021)  Social Connections: Unknown (01/14/2022)   Received from [facility], [facility]  Tobacco Use: Medium Risk (08/09/2023)   SDOH Interventions:     Readmission Risk Interventions    08/10/2023   10:49 AM 08/09/2023    3:05 PM 05/19/2023   11:32 AM  Readmission Risk Prevention Plan  Post Dischage Appt   Complete  Medication Screening   Complete  Transportation Screening Complete Complete Complete  PCP or Specialist Appt within 5-7 Days Complete Complete   Home Care Screening Complete Complete   Medication Review (RN CM) Complete Complete

## 2023-08-10 NOTE — Assessment & Plan Note (Signed)
08-10-2023 continue with flomax 0.4 mg qday

## 2023-08-10 NOTE — Care Management Important Message (Signed)
Important Message  Patient Details  Name: [name] MRN: [id number] Date of Birth: 1956/01/01   Important Message Given:  Yes - Medicare IM     Dorena Bodo 08/10/2023, 2:57 PM

## 2023-08-10 NOTE — Assessment & Plan Note (Signed)
08-10-2023 not on systemic anticoagulation.

## 2023-08-10 NOTE — Assessment & Plan Note (Signed)
08-10-2023 remains on IV Ancef 08-11-2023 treated with IV ancef

## 2023-08-10 NOTE — Assessment & Plan Note (Addendum)
Since admission until 08-09-2023 Blood culture sent this admission it is also showing gram-positive cocci Currently on cefazolin a per ID recommendation. Surface echo did not show any evidence of vegetation.  Underwent TEE today.  Pending report MRI thoracic and lumbar did not show any evidence of active infection. Currently on Mucinex twice daily for cough Currently not on supplemental oxygen.  Monitor 08-10-2023 blood cx grew MSSA. On IV Ancef per ID. 08-11-2023 CT surgery and Cardiology consults have both seen patient. Thanks to them for their participation. No plans for RHC/LHC.  Plan on placing PICC line and sending pt home with IV ABX. Indication: MSSA MV IE Regimen: Cefazolin 2g IV every 8 hours End date: 09/17/23 (6 weeks from neg BCx 11/30) 08-12-2023 PICC place on 08-11-2023.  DC to home with 6 weeks of IV Ancef 2 g q8h. End date 09-17-2023.

## 2023-08-10 NOTE — Assessment & Plan Note (Signed)
Since admission until 08-09-2023 QT 560 / Qtc 592.  Likely due to low electrolytes avoid QT prolonging medications as able Electrolytes replaced as above Repeat EKG 12/2 showed QTc improved to 446 ms 08-10-2023 stable.

## 2023-08-10 NOTE — Progress Notes (Signed)
Ok to replace k with x1 per Dr Imogene Burn.  Ulyses Southward, PharmD, BCIDP, AAHIVP, CPP Infectious Disease Pharmacist 08/10/2023 12:30 PM

## 2023-08-10 NOTE — Consult Note (Addendum)
Cardiology Consultation   Patient ID: [name] MRN: [id number]; DOB: 06/05/1956  Admit date: 08/06/2023 Date of Consult: 08/10/2023  PCP:  Manuel Corn, MD   [facility] Providers Cardiologist:  Manuel Lerner, DO   {  Patient Profile:   [name] is a 67 y.o. 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  History of Present Illness:   [name] has had prior right and left heart catheterization in 02/08/2021 that demonstrated nonobstructive CAD with 70% stenosis of the RCA with recommendations for medical management and other residual disease in LAD and left circumflex.  At that time he had borderline pulmonary hypertension and PAP 20 with PCWP 9.  He has had preserved EF over the years with normal RV function.  Has history of COPD however has not been followed by pulmonologist for several years.  Currently patient is being evaluated for MSSA bacteria secondary to CAP.  Initially he was seen on 08/05/2023 and had CT showing signs of pneumonia and discharged on doxycycline.  Blood cultures were positive for MSSA (1/2) and then returned back 08/06/2023 to start IV antibiotics.  Lactic acid has been normal.  MRI of the thoracic and lumbar spine negative for acute infection.  Surface echocardiogram did not show vegetation.  He had TEE yesterday that reported mobile mass 0.5 cm x 0.8 cm in the RV attached to either the chordae over the wall of the RV.  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  Cardiology asked to further manage this.  Overall patient does very well from a HFpEF standpoint.  He is managed on oral  diuretics and does not report any significant orthopnea, peripheral edema.  Denies any chest pain, shortness of breath, palpitations, syncope, dizziness, fatigue.  He reports that he is ambulatory without interference with ADLs.  Reports not being compliant with the CPAP but did not tolerate.  Otherwise does not have any concerns today.  Had a ultrasound of the right lower leg that showed small fluid surrounding a fat lobule that represented coalescing edema versus small seroma.  Has AKI with rising creatinine 2.43, increased from 1.63 yesterday.  Potassium 3.2.  Respiratory panel negative.  Normal lactic acid.  Troponins negative.  Normal CBC.    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   ED (erectile dysfunction)    GERD (gastroesophageal reflux disease)    Hypercalcemia    Hyperlipidemia    Hypertension    Insomnia    Long-term use of high-risk medication    Nephrocalcinosis    Nephrolithiasis    Obesity    Osteopenia    Pancreatitis    admitted 03/19/01-06/05/01   PE (pulmonary embolism) 01/30/2013  Proteinuria    Pulmonary embolism (HCC) 01/30/2013   Sarcoidosis    Sleep apnea    Smoker    Stroke [facility])     Past Surgical History:  Procedure Laterality Date   ABDOMINAL EXPLORATION SURGERY     BACK SURGERY     CHOLECYSTECTOMY     RIGHT/LEFT HEART CATH AND CORONARY ANGIOGRAPHY N/A 05/12/2021   Procedure: RIGHT/LEFT HEART CATH AND CORONARY ANGIOGRAPHY;  Surgeon: Elder Negus, MD;  Location: [facility] INVASIVE CV [facility];  Service: Cardiovascular;  Laterality:  N/A;   SHOULDER ARTHROSCOPY Right 10/30/2020   Procedure: ARTHROSCOPY SHOULDER WITH EXTENSIVE DEBRIDEMENT;  Surgeon: Kathryne Hitch, MD;  Location: [facility];  Service: Orthopedics;  Laterality: Right;   SHOULDER SURGERY     TRACHEOSTOMY     closed   TRANSESOPHAGEAL ECHOCARDIOGRAM (CATH LAB) N/A 08/09/2023   Procedure: TRANSESOPHAGEAL ECHOCARDIOGRAM;  Surgeon: Thomasene Ripple, DO;  Location: [facility] INVASIVE CV [facility];  Service: Cardiovascular;  Laterality: N/A;    Inpatient Medications: Scheduled Meds:  aspirin EC  81 mg Oral Daily   atorvastatin  20 mg Oral Daily   dextromethorphan-guaiFENesin  1 tablet Oral BID   diltiazem  360 mg Oral Daily   heparin injection (subcutaneous)  5,000 Units Subcutaneous Q12H   hydrALAZINE  100 mg Oral BID   isosorbide mononitrate  90 mg Oral Daily   metoprolol  200 mg Oral Daily   sodium chloride flush  10 mL Intravenous Q12H   tamsulosin  0.4 mg Oral QPC breakfast   Continuous Infusions:   ceFAZolin (ANCEF) IV 2 g (08/10/23 1441)   PRN Meds: acetaminophen **OR** acetaminophen, naLOXone (NARCAN)  injection, oxyCODONE-acetaminophen, polyethylene glycol, traZODone  Allergies:    Allergies  Allergen Reactions   Codeine Nausea And Vomiting   Hydrochlorothiazide Other (See Comments)    Unknown per Pt.    Hydrocodone-Acetaminophen Nausea And Vomiting   Hydromorphone Other (See Comments)    hallucinations     Social History:   Social History   Socioeconomic History   Marital status: Married    Spouse name: Fish farm manager   Number of children: 2   Years of education: Not on file   Highest education level: Associate degree: academic program  Occupational History   Not on file  Tobacco Use   Smoking status: Former    Current packs/day: 0.00    Average packs/day: 0.3 packs/day for 15.5 years (3.9 ttl pk-yrs)    Types: Cigarettes    Start date: 09/06/2005    Quit date: 02/23/2021    Years since quitting: 2.4   Smokeless tobacco:  Never  Vaping Use   Vaping status: Never Used  Substance and Sexual Activity   Alcohol use: No    Alcohol/week: 0.0 standard drinks of alcohol   Drug use: No   Sexual activity: Yes  Other Topics Concern   Not on file  Social History Narrative   Not on file   Social Determinants of Health   Financial Resource Strain: Low Risk  (04/21/2021)   Overall Financial Resource Strain (CARDIA)    Difficulty of Paying Living Expenses: Not very hard  Food Insecurity: No Food Insecurity (08/06/2023)   Hunger Vital Sign    Worried About Running Out of Food in the Last Year: Never true    Ran Out of Food in the Last Year: Never true  Transportation Needs: No Transportation Needs (08/06/2023)   PRAPARE - Transportation    Lack of Transportation (Medical): No    Lack  of Transportation (Non-Medical): No  Physical Activity: Not on file  Stress: Not on file  Social Connections: Unknown (01/14/2022)   Received from [facility]   Social Network    Social Network: Not on file  Intimate Partner Violence: Not At Risk (08/06/2023)   Humiliation, Afraid, Rape, and Kick questionnaire    Fear of Current or Ex-Partner: No    Emotionally Abused: No    Physically Abused: No    Sexually Abused: No    Family History:   Family History  Problem Relation Age of Onset   Hypertension Father    CVA Father    Lung cancer Father    Alzheimer's disease Mother    Hypertension Sister    Heart failure Sister      ROS:  Please see the history of present illness.  All other ROS reviewed and negative.     Physical Exam/Data:   Vitals:   08/10/23 0324 08/10/23 0334 08/10/23 0723 08/10/23 1707  BP: (!) 128/97  (!) 157/95 118/79  Pulse: 62  64 (!) 58  Resp: 18  18 17   Temp: 98.8 F (37.1 C)  98.2 F (36.8 C) 98.3 F (36.8 C)  TempSrc: Oral  Oral Oral  SpO2: 95%  96% 96%  Weight:  97.8 kg    Height:        Intake/Output Summary (Last 24 hours) at 08/10/2023 1831 Last data filed at  08/10/2023 1709 Gross per 24 hour  Intake 1997.18 ml  Output 400 ml  Net 1597.18 ml      08/10/2023    3:34 AM 08/09/2023    6:02 AM 08/08/2023    4:14 AM  Last 3 Weights  Weight (lbs) 215 lb 9.8 oz 215 lb 13.3 oz 218 lb 11.1 oz  Weight (kg) 97.8 kg 97.9 kg 99.2 kg     Body mass index is 29.24 kg/m.  General:  Well nourished, well developed, in no acute distress HEENT: normal Neck: no JVD Vascular: No carotid bruits; Distal pulses 2+ bilaterally Cardiac:  normal S1, S2; RRR; no murmur  Lungs:  clear to auscultation bilaterally, no wheezing, rhonchi or rales  Abd: soft, nontender, no hepatomegaly  Ext: no edema Musculoskeletal:  No deformities, BUE and BLE strength normal and equal Skin: warm and dry  Neuro:  CNs 2-12 intact, no focal abnormalities noted Psych:  Normal affect   EKG:  The EKG was personally reviewed and demonstrates: Sinus rhythm, heart rate 67.  QTc 592.  Nonspecific T wave changes.  Repeat EKG today shows sinus bradycardia with more pronounced inferolateral T wave inversion.  QTc 462.  Telemetry:  Telemetry was personally reviewed and demonstrates: sinus    Relevant CV Studies: TEE 08/09/2023 1. Left ventricular ejection fraction, by estimation, is 60 to 65%. The  left ventricle has normal function.   2. There is a mobile mass (0.5 cm x 0.8 cm )in the right ventricle  suspected either on the chordae or the wall of the ventricle. Right  ventricular systolic function is normal. The right ventricular size is  normal.   3. No left atrial/left atrial appendage thrombus was detected. The LAA  emptying velocity was 104 cm/s.   4. There is fibrinous mobile mass of the posterior leaflet of the mitral  valve.. The mitral valve is abnormal. Trivial mitral valve regurgitation.  No evidence of mitral stenosis.   5. The aortic valve is normal in structure. There is moderate thickening  of the aortic valve. Aortic  valve regurgitation is not visualized. No  aortic  stenosis is present.   6. There is borderline dilatation of the aortic root, measuring 38 mm.   Echocardiogram 08/07/2023 1. Left ventricular ejection fraction, by estimation, is 60 to 65%. The  left ventricle has normal function. The left ventricle has no regional  wall motion abnormalities. There is mild concentric left ventricular  hypertrophy. Left ventricular diastolic  parameters are consistent with Grade I diastolic dysfunction (impaired  relaxation).   2. Right ventricular systolic function is normal. The right ventricular  size is normal. Tricuspid regurgitation signal is inadequate for assessing  PA pressure.   3. The mitral valve is grossly normal. Trivial mitral valve  regurgitation. No evidence of mitral stenosis.   4. The aortic valve is tricuspid. Aortic valve regurgitation is trivial.  No aortic stenosis is present.   5. The inferior vena cava is normal in size with greater than 50%  respiratory variability, suggesting right atrial pressure of 3 mmHg.   Comparison(s): No significant change from prior study.   Right left heart catheterization 05/12/2021 LM: Normal LAD: Ectatic vessel         Diag 1 ostial 50% stenosis LCX: Ectatic vessel         Ostial Lcx 30% stenosis RCA: Ectatic vessel, tapers rapidly distally         Large RV marginal branch supplies more territory than distal RCA           Distal RCA focal 70% stenosis          Borderline pulmonary hypertension (mPAP 20 mmHg) Most likely WHO GRP III Normal PCW 9 mmHg   In absence of angina symptoms, recommend continued medical management. If he has angina symptoms in future, could consider PCI to distal RCA.  Laboratory Data:  High Sensitivity Troponin:   Recent Labs  Lab 08/05/23 1400 08/05/23 1550  TROPONINIHS 8 10     Chemistry Recent Labs  Lab 08/05/23 1400 08/05/23 1406 08/07/23 0523 08/07/23 0916 08/09/23 0541 08/10/23 1055  NA 143   < > 140  --  137 139  K 2.7*   < > 3.4*  --  3.2* 3.2*   CL 117*   < > 108  --  101 107  CO2 17*   < > 26  --  27 25  GLUCOSE 92   < > 98  --  94 128*  BUN 10   < > 9  --  14 12  CREATININE 1.33*   < > 1.63*  --  2.43* 1.77*  CALCIUM 6.9*   < > 8.9  --  9.2 8.7*  MG 1.6*  --   --  2.1  --   --   GFRNONAA 59*   < > 46*  --  29* 42*  ANIONGAP 9   < > 6  --  9 7   < > = values in this interval not displayed.    Recent Labs  Lab 08/05/23 1400 08/06/23 1218  PROT 5.0* 6.6  ALBUMIN 2.7* 3.4*  AST 14* 17  ALT 10 12  ALKPHOS 59 65  BILITOT 0.7 0.8   Lipids No results for input(s): "CHOL", "TRIG", "HDL", "LABVLDL", "LDLCALC", "CHOLHDL" in the last 168 hours.  Hematology Recent Labs  Lab 08/06/23 1218 08/07/23 0523 08/09/23 0541  WBC 10.4 6.9 6.3  RBC 4.39 4.20* 4.62  HGB 13.7 12.9* 14.3  HCT 40.5 38.7* 42.2  MCV 92.3 92.1 91.3  MCH 31.2 30.7  31.0  MCHC 33.8 33.3 33.9  RDW 14.7 14.7 14.0  PLT 192 176 212   Thyroid No results for input(s): "TSH", "FREET4" in the last 168 hours.  BNP Recent Labs  Lab 08/05/23 1400  BNP 1,648.2*    DDimer No results for input(s): "DDIMER" in the last 168 hours.   Radiology/Studies:  [place] EKG SITE RITE  Result Date: 08/10/2023 If Site Rite image not attached, placement could not be confirmed due to current cardiac rhythm.  ECHO TEE  Result Date: 08/09/2023    TRANSESOPHOGEAL ECHO REPORT   Patient Name:   [name] Date of Exam: 08/09/2023 Medical Rec #:  [id number]       Height:       72.0 in Accession #:    [id number]      Weight:       215.8 lb Date of Birth:  [birth year]       BSA:          2.201 m Patient Age:    67 years        BP:           153/95 mmHg Patient Gender: M               HR:           75 bpm. Exam Location:  Inpatient Procedure: Transesophageal Echo, Cardiac Doppler and Color Doppler Indications:     Endocarditis  History:         Patient has prior history of Echocardiogram examinations, most                  recent 08/07/2023. CHF; Risk Factors:Hypertension and Sleep                   Apnea.  Sonographer:     Darlys Gales Referring Phys:  [id number] Cyndi Bender Diagnosing Phys: Thomasene Ripple DO PROCEDURE: After discussion of the risks and benefits of a TEE, an informed consent was obtained from the patient. The transesophogeal probe was passed without difficulty through the esophogus of the patient. Sedation performed by different physician. The patient was monitored while under deep sedation. Anesthestetic sedation was provided intravenously by Anesthesiology: 470mg  of Propofol. The patient developed no complications during the procedure.  IMPRESSIONS  1. Left ventricular ejection fraction, by estimation, is 60 to 65%. The left ventricle has normal function.  2. There is a mobile mass (0.5 cm x 0.8 cm )in the right ventricle suspected either on the chordae or the wall of the ventricle. Right ventricular systolic function is normal. The right ventricular size is normal.  3. No left atrial/left atrial appendage thrombus was detected. The LAA emptying velocity was 104 cm/s.  4. There is fibrinous mobile mass of the posterior leaflet of the mitral valve.. The mitral valve is abnormal. Trivial mitral valve regurgitation. No evidence of mitral stenosis.  5. The aortic valve is normal in structure. There is moderate thickening of the aortic valve. Aortic valve regurgitation is not visualized. No aortic stenosis is present.  6. There is borderline dilatation of the aortic root, measuring 38 mm. FINDINGS  Left Ventricle: Left ventricular ejection fraction, by estimation, is 60 to 65%. The left ventricle has normal function. The left ventricular internal cavity size was normal in size. Right Ventricle: There is a mobile mass (0.5 cm x 0.8 cm )in the right ventricle suspected either on the chordae or the wall of the ventricle. The right ventricular size is normal. Right  vetricular wall thickness was not well visualized. Right ventricular systolic function is normal. Left Atrium: Left atrial size was not  well visualized. No left atrial/left atrial appendage thrombus was detected. The LAA emptying velocity was 104 cm/s. Right Atrium: Right atrial size was not well visualized. Pericardium: Trivial pericardial effusion is present. The pericardial effusion is anterior to the right ventricle. Mitral Valve: There is fibrinous mobile mass of the posterior leaflet of the mitral valve. The mitral valve is abnormal. There is mild thickening of the mitral valve leaflet(s). Trivial mitral valve regurgitation. No evidence of mitral valve stenosis. Tricuspid Valve: The tricuspid valve is grossly normal. Tricuspid valve regurgitation is mild . No evidence of tricuspid stenosis. Aortic Valve: The aortic valve is normal in structure. There is moderate thickening of the aortic valve. Aortic valve regurgitation is not visualized. No aortic stenosis is present. Pulmonic Valve: The pulmonic valve was not well visualized. Pulmonic valve regurgitation is trivial. Aorta: There is borderline dilatation of the aortic root, measuring 38 mm. Venous: The right upper pulmonary vein, right lower pulmonary vein, left upper pulmonary vein and left lower pulmonary vein are normal. IAS/Shunts: No atrial level shunt detected by color flow Doppler. Thomasene Ripple DO Electronically signed by Thomasene Ripple DO Signature Date/Time: 08/09/2023/4:31:01 PM    Final    EP STUDY  Result Date: 08/09/2023 See surgical note for result.  [place] RT LOWER EXTREM LTD SOFT TISSUE NON VASCULAR  Result Date: 08/08/2023 CLINICAL DATA:  Right calf swelling. EXAM: ULTRASOUND RIGHT LOWER EXTREMITY LIMITED TECHNIQUE: Ultrasound examination of the lower extremity soft tissues was performed in the area of clinical concern. COMPARISON:  Bilateral lower extremity deep venous ultrasound 12/10/2020 (negative) FINDINGS: In the area of interest of the right calf, there is a well-circumscribed small fluid focus that appears to be surrounding a fat lobule, measuring up to approximately  1.1 x 0.5 x 0.7 cm. Mild internal echoes. No internal color flow vascularity. IMPRESSION: In the area of interest of the right calf, there is a small fluid focus that appears to be surrounding a fat lobule, measuring up to approximately 1.1 x 0.5 x 0.7 cm. This is nonspecific and may represent a small region of coalescing edema or small seroma. No increased vascularity is seen to indicate an abscess. Electronically Signed   By: Neita Garnet M.D.   On: 08/08/2023 18:40   ECHOCARDIOGRAM COMPLETE  Result Date: 08/07/2023    ECHOCARDIOGRAM REPORT   Patient Name:   [name] Date of Exam: 08/07/2023 Medical Rec #:  [id number]       Height:       72.0 in Accession #:    [id number]      Weight:       214.5 lb Date of Birth:  [birth year]       BSA:          2.195 m Patient Age:    67 years        BP:           154/99 mmHg Patient Gender: M               HR:           65 bpm. Exam Location:  Inpatient Procedure: 2D Echo, Color Doppler, Cardiac Doppler and Strain Analysis Indications:    Bactermia                 Congestive Heart Failure  History:        Patient has  prior history of Echocardiogram examinations, most                 recent 05/07/2022. CHF, Stroke and CVA; Risk Factors:Dyslipidemia,                 Sleep Apnea and Current Smoker. GERD, Chronic Kidney Disease,                 History of Pulmonary Embolism.  Sonographer:    Raeford Razor RDCS Referring Phys: [id number] SAGAR H JINWALA IMPRESSIONS  1. Left ventricular ejection fraction, by estimation, is 60 to 65%. The left ventricle has normal function. The left ventricle has no regional wall motion abnormalities. There is mild concentric left ventricular hypertrophy. Left ventricular diastolic parameters are consistent with Grade I diastolic dysfunction (impaired relaxation).  2. Right ventricular systolic function is normal. The right ventricular size is normal. Tricuspid regurgitation signal is inadequate for assessing PA pressure.  3. The mitral valve is  grossly normal. Trivial mitral valve regurgitation. No evidence of mitral stenosis.  4. The aortic valve is tricuspid. Aortic valve regurgitation is trivial. No aortic stenosis is present.  5. The inferior vena cava is normal in size with greater than 50% respiratory variability, suggesting right atrial pressure of 3 mmHg. Comparison(s): No significant change from prior study. FINDINGS  Left Ventricle: Left ventricular ejection fraction, by estimation, is 60 to 65%. The left ventricle has normal function. The left ventricle has no regional wall motion abnormalities. The left ventricular internal cavity size was normal in size. There is  mild concentric left ventricular hypertrophy. Left ventricular diastolic parameters are consistent with Grade I diastolic dysfunction (impaired relaxation). Right Ventricle: The right ventricular size is normal. No increase in right ventricular wall thickness. Right ventricular systolic function is normal. Tricuspid regurgitation signal is inadequate for assessing PA pressure. Left Atrium: Left atrial size was normal in size. Right Atrium: Right atrial size was normal in size. Pericardium: There is no evidence of pericardial effusion. Presence of epicardial fat layer. Mitral Valve: The mitral valve is grossly normal. Trivial mitral valve regurgitation. No evidence of mitral valve stenosis. Tricuspid Valve: The tricuspid valve is grossly normal. Tricuspid valve regurgitation is trivial. No evidence of tricuspid stenosis. Aortic Valve: The aortic valve is tricuspid. Aortic valve regurgitation is trivial. No aortic stenosis is present. Aortic valve mean gradient measures 5.0 mmHg. Aortic valve peak gradient measures 11.2 mmHg. Aortic valve area, by VTI measures 3.04 cm. Pulmonic Valve: The pulmonic valve was grossly normal. Pulmonic valve regurgitation is not visualized. No evidence of pulmonic stenosis. Aorta: The aortic root and ascending aorta are structurally normal, with no  evidence of dilitation. Venous: The inferior vena cava is normal in size with greater than 50% respiratory variability, suggesting right atrial pressure of 3 mmHg. IAS/Shunts: The atrial septum is grossly normal.  LEFT VENTRICLE PLAX 2D LVIDd:         5.80 cm   Diastology LVIDs:         3.30 cm   LV e' medial:    5.66 cm/s LV PW:         1.30 cm   LV E/e' medial:  9.5 LV IVS:        1.30 cm   LV e' lateral:   10.70 cm/s LVOT diam:     2.20 cm   LV E/e' lateral: 5.0 LV SV:         98 LV SV Index:   45  2D Longitudinal Strain LVOT Area:     3.80 cm  2D Strain GLS Avg:     -20.1 %  RIGHT VENTRICLE             IVC RV Basal diam:  3.30 cm     IVC diam: 2.00 cm RV Mid diam:    2.30 cm RV S prime:     17.30 cm/s TAPSE (M-mode): 2.1 cm LEFT ATRIUM             Index        RIGHT ATRIUM           Index LA diam:        3.00 cm 1.37 cm/m   RA Area:     25.10 cm LA Vol (A2C):   78.8 ml 35.90 ml/m  RA Volume:   85.40 ml  38.91 ml/m LA Vol (A4C):   33.6 ml 15.31 ml/m LA Biplane Vol: 51.8 ml 23.60 ml/m  AORTIC VALVE AV Area (Vmax):    3.03 cm AV Area (Vmean):   3.00 cm AV Area (VTI):     3.04 cm AV Vmax:           167.00 cm/s AV Vmean:          104.000 cm/s AV VTI:            0.321 m AV Peak Grad:      11.2 mmHg AV Mean Grad:      5.0 mmHg LVOT Vmax:         133.00 cm/s LVOT Vmean:        82.100 cm/s LVOT VTI:          0.257 m LVOT/AV VTI ratio: 0.80  AORTA Ao Root diam: 3.70 cm Ao Asc diam:  3.90 cm MITRAL VALVE MV Area (PHT): 2.82 cm    SHUNTS MV Decel Time: 269 msec    Systemic VTI:  0.26 m MV E velocity: 54.00 cm/s  Systemic Diam: 2.20 cm MV A velocity: 80.20 cm/s MV E/A ratio:  0.67 Lennie Odor MD Electronically signed by Lennie Odor MD Signature Date/Time: 08/07/2023/11:56:37 AM    Final    MR LUMBAR SPINE WO CONTRAST  Result Date: 08/07/2023 CLINICAL DATA:  Initial evaluation for bacteremia. Evaluate for infection. EXAM: MRI LUMBAR SPINE WITHOUT CONTRAST TECHNIQUE: Multiplanar, multisequence MR  imaging of the lumbar spine was performed. No intravenous contrast was administered. COMPARISON:  Prior MRI from 06/12/2022. FINDINGS: Segmentation: Standard. Lowest well-formed disc space labeled the L5-S1 level. Alignment: 3 mm degenerative retrolisthesis of L2 on L3, stable. Alignment otherwise normal preservation of the normal lumbar lordosis. Vertebrae: Susceptibility artifact related to prior PLIF at L3-4 and L4-5. Additional left lateral fixation present at L4-5 as well. Vertebral body height maintained without acute or chronic fracture. Bone marrow signal intensity within normal limits. No worrisome osseous lesions. Reactive endplate change with marrow edema about the L2-3 interspace appears to be degenerative in nature, progressed from prior. No evidence for osteomyelitis discitis or septic arthritis. Conus medullaris and cauda equina: Conus extends to the L1 level. Conus and cauda equina appear normal. No epidural collections. Paraspinal and other soft tissues: Chronic postoperative scarring present within the posterior paraspinous soft tissues. No acute inflammatory changes. Mild edema within the subcutaneous fat of the lower back, likely related to overall volume status. 5 mm T2 hyperintense left renal cyst, benign in appearance, no follow-up imaging recommended. Disc levels: L1-2: Normal interspace. Mild bilateral facet hypertrophy. No spinal stenosis. Foramina remain  patent. L2-3: 3 mm retrolisthesis with degenerative intervertebral disc space narrowing. Circumferential disc bulge with reactive endplate change. Moderate facet and ligament flavum hypertrophy. Resultant mild to moderate canal with bilateral subarticular stenosis. Moderate bilateral L3 foraminal narrowing. L3-4: Prior PLIF. No residual spinal stenosis. Foramina appear patent. L4-5: Prior PLIF. No residual spinal stenosis. Foramina appear patent. L5-S1: Disc desiccation with mild disc bulge. Moderate left worse than right facet  hypertrophy. No spinal stenosis. Foramina remain patent. IMPRESSION: 1. No MRI evidence for acute infection within the lumbar spine. 2. Prior PLIF at L3-4 and L4-5 without residual or recurrent stenosis. 3. 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 Reactive endplate changes with marrow edema at this level, felt to be degenerative in nature, and progressed as compared to prior MRI from 06/12/2022. Electronically Signed   By: Rise Mu M.D.   On: 08/07/2023 04:48   MR THORACIC SPINE WO CONTRAST  Result Date: 08/07/2023 CLINICAL DATA:  Initial evaluation for bacteremia, evaluate for infection. EXAM: MRI THORACIC SPINE WITHOUT CONTRAST TECHNIQUE: Multiplanar, multisequence MR imaging of the thoracic spine was performed. No intravenous contrast was administered. COMPARISON:  None Available. FINDINGS: Alignment: Mild dextroscoliosis. 4 mm degenerative anterolisthesis of C7 on T1, likely chronic and facet mediated. Vertebrae: Vertebral body height maintained without acute or chronic fracture. Bone marrow signal intensity within normal limits. Few scattered subcentimeter benign hemangiomata noted. No worrisome osseous lesions. Reactive endplate change with marrow edema present about the C7-T1 interspace and bilateral C7-T1 facets, likely degenerative. No other evidence for osteomyelitis discitis or septic arthritis within the thoracic spine. Cord:  Normal signal and morphology.  No epidural collections. Paraspinal and other soft tissues: Paraspinous soft tissues demonstrate no acute finding. Trace layering bilateral pleural effusions noted. Disc levels: C7-T1: 4 mm anterolisthesis. Associated broad posterior pseudo disc bulge/uncovering. Moderate bilateral facet hypertrophy. No significant spinal stenosis. Moderate bilateral C8 foraminal narrowing. T1-2: Small left paracentral disc protrusion  mildly indents the ventral thecal sac. No significant spinal stenosis. Foramina remain patent. T3-4: Small central disc protrusion mildly indents the ventral thecal sac. No stenosis. T7-8: Small central disc protrusion minimally indents the ventral thecal sac. No stenosis. Otherwise, no other significant disc pathology seen within the thoracic spine. No significant canal or foraminal stenosis or evidence for neural impingement. IMPRESSION: 1. No evidence for acute infection within the thoracic spine. 2. 4 mm degenerative anterolisthesis of C7 on T1 with resultant moderate bilateral C8 foraminal stenosis. 3. Small noncompressive disc protrusions at T1-2, T3-4, and T8-9 without stenosis. 4. Trace layering bilateral pleural effusions. Electronically Signed   By: Rise Mu M.D.   On: 08/07/2023 04:39     Assessment and Plan:   MSSA bacteremia secondary to CAP Underwent transesophageal echocardiogram given the MSSA bacteremia. See TEE for additional details Cardiology consulted for further recommendations. CT surgery was also consulted Discussed with MD, likely will need to treat medically for infective endocarditis.  Will await further recommendations from both teams, but will likely medically manage with continued abx course per ID given overall stability and current clinical presentation.  Chronic HFpEF Previous heart cath in 2022 showed borderline pulmonary hypertension and PAP 20, PCW 9.  Echo here shows normal biventricular function.  Overall stable and asymptomatic.  Euvolemic on exam.  Had AKI so some of his GDMT is being withheld. Currently on diltiazem 260 mg, hydralazine 100 mg, Imdur 90 mg, Toprol-XL 200 mg.  PTA was also  on Farxiga, Entresto 97-23, torsemide 20 mg.  CKD Elevated from baseline 2.43 from 1.6.  Prolonged QTc 12/2 592, repeat EKG shows 462 today.  Replete potassium greater than 4 and magnesium greater than 2.  Avoid prolonging  medications/antibiotics.  Nonobstructive CAD Previous catheterization in 2022 showed 70% stenosis of RCA with 50% stenosis LAD, 30% stenosis circumflex with plans for medical management.  Could consider PCI if he were to have recurrent anginal complaints, denies any here. Continue aspirin and atorvastatin 20 mg.  Will check LDL.  OSA noncompliant with CPAP Does not tolerate.  Consider outpatient evaluation with ENT for other alternatives.  History of CVA and PE COPD and sarcoidosis (seems to be well controlled now)  Risk Assessment/Risk Scores:    New York Heart Association (NYHA) Functional Class NYHA Class II    For questions or updates, please contact [facility] Please consult [email or web address] for contact info under    Signed, Abagail Kitchens, PA-C  08/10/2023 11:16 AM   ADDENDUM:   Patient seen and examined withSheng Sandra Cockayne, PA-C I personally taken a history, examined the patient, reviewed relevant notes,  laboratory data / imaging studies.  I performed a substantive portion of this encounter and formulated the important aspects of the plan.  I agree with the APP's note, impression, and recommendations; however, I have edited the note to reflect changes or salient points.  Patient is seen and examined at bedside independently of the APP. Resting in bed comfortably. Denies anginal chest pain or heart failure symptoms.  Denies fevers, chills, near-syncope or syncopal events.  PHYSICAL EXAM: Today's Vitals   08/10/23 0324 08/10/23 0334 08/10/23 0723 08/10/23 1707  BP: (!) 128/97  (!) 157/95 118/79  Pulse: 62  64 (!) 58  Resp: 18  18 17   Temp: 98.8 F (37.1 C)  98.2 F (36.8 C) 98.3 F (36.8 C)  TempSrc: Oral  Oral Oral  SpO2: 95%  96% 96%  Weight:  97.8 kg    Height:      PainSc:    0-No pain   Body mass index is 29.24 kg/m.   Net IO Since Admission: 2,946.95 mL [08/10/23 1831]  Filed Weights   08/08/23 0414 08/09/23 0602 08/10/23 0334  Weight: 99.2  kg 97.9 kg 97.8 kg    Physical Exam  Constitutional: No distress.  hemodynamically stable  Neck: No JVD present.  Cardiovascular: Normal rate, regular rhythm, S1 normal and S2 normal. Exam reveals no gallop, no S3 and no S4.  No murmur heard. Pulmonary/Chest: Effort normal and breath sounds normal. No stridor. He has no wheezes. He has no rales.  Abdominal: Soft. Bowel sounds are normal. He exhibits no distension. There is no abdominal tenderness.  Musculoskeletal:        General: No edema.     Cervical back: Neck supple.  Neurological: He is alert and oriented to person, place, and time. He has intact cranial nerves (2-12).  Skin: Skin is warm.    EKG: (personally reviewed by me) 08/10/2023: Sinus bradycardia, 58 bpm, left axis, T WI in the inferolateral leads consider ischemia, prolonged QT.  TWI compared to 08-07-2023 more pronounced  Telemetry: (personally reviewed by me) Sinus without ectopy   Impression:  MRSA bacteremia Community-acquired pneumonia Abnormal transesophageal echocardiogram Chronic HFpEF. Chronic kidney disease. Prolonged QT. Nonobstructive CAD. OSA not compliant with CPAP. History of CVA/pulmonary embolism History of COPD and sarcoidosis  Recommendations:  Patient was recently seen in the emergency room department and diagnosed with community-acquired  pneumonia and sent home on antibiotics. However he was called back due to 1 out of 2 blood cultures were notable for MSSA bacteremia.  Blood cultures drawn 08/06/2023 so far no growth to date, results are not final  Transesophageal echocardiogram was performed to evaluate for possible endocarditis.  Images of the transesophageal echocardiogram independently reviewed.  Also reached out to the echocardiographer Dr. Servando Salina as part of this consult.  Patient has a history of MSSA bacteremia back in 2022.  In the right ventricular cavity he is noted to have a circular nodule likely attached to the chordae tendon  of the tricuspid valve.  This appears to be calcified and ? Healed vegetation.  Dimensions noted in the TEE report.  The mitral valve is native, leaflets are thickened, but no obvious vegetation noted on the valve.  No significant mitral valve regurgitation or stenosis.  CT surgery has been consulted during this hospitalization as well and they recommend conservative management with antibiotics.  Patient's LVEF is preserved, no significant valvular heart disease, and the findings noted above could be secondary to prior healed vegetation or incidental findings.  No evidence of conduction disease.  Patient is not in overt heart failure.  He has responded to antibiotics as the repeat blood cultures thus far no growth to date.  Patient remains afebrile and hemodynamically stable.  I would be in the favor of treating him as presumed endocarditis and once he is done with IV antibiotics repeat TEE.   I discussed this in detail with the patient who is in agreement.  Will find out from infectious disease when he would complete IV antibiotics and thereafter schedule an outpatient follow-up visit for reevaluation and setting him up for repeat TEE.  This note was created using a voice recognition software as a result there may be grammatical errors inadvertently enclosed that do not reflect the nature of this encounter. Every attempt is made to correct such errors.   Manuel Lerner, DO, [facility]  [address] [place], [place] [address] Pager: [phone] Office: [phone] 08/10/2023 6:31 PM

## 2023-08-10 NOTE — Assessment & Plan Note (Addendum)
Since admission until 08-09-2023 Creatinine at baseline less than 1.5.  In the last 24 hours, creatinine elevated to 2.43 Start normal saline at 75 mL/h Hold torsemide, [name] 08-10-2023 Scr 1.77 today.  Back at baseline 1.6-1.9. continue to hold diuretics for now. Stop IVF 08-11-2023 stable now. 08-12-2023 DC Scr 1.84

## 2023-08-10 NOTE — Assessment & Plan Note (Addendum)
08-10-2023 replete with po kcl. 08-12-2023 DC K 3.7

## 2023-08-10 NOTE — Subjective & Objective (Addendum)
Pt seen and examined.   Pt observed walking in the hallways with mobility tech. Does not require any assistance or devices.  Discussed with cards. They will f/u with him in clinic. Will stop [place] for now. Cards will decide on when to restart. Pt will need to restart these meds at some point in the future but his most pressing medical issues to treating his endocarditis with IV ABX for 6 weeks.

## 2023-08-10 NOTE — Progress Notes (Signed)
Mobility Specialist Progress Note:   08/10/23 1030  Mobility  Activity Ambulated with assistance in hallway  Level of Assistance Standby assist, set-up cues, supervision of patient - no hands on  Assistive Device None  Distance Ambulated (ft) 550 ft  Activity Response Tolerated well  Mobility Referral Yes  $Mobility charge 1 Mobility  Mobility Specialist Start Time (ACUTE ONLY) 1030  Mobility Specialist Stop Time (ACUTE ONLY) 1048  Mobility Specialist Time Calculation (min) (ACUTE ONLY) 18 min   Pt eager for mobility session. Required no physical assistance throughout. Denies any symptoms, pt back sitting EOB with all needs met.   Addison Lank Mobility Specialist Please contact via SecureChat or  Rehab office at [phone]

## 2023-08-10 NOTE — Assessment & Plan Note (Addendum)
Since admission until 08-09-2023 Bilateral pedal edema  Last ECHO with LVEF 55-60% with G1DD. Follows up with [facility] cardiology PTA meds- Toprol XL 200 mg daily, Cardizem 360 mg daily, torsemide 20mg  daily, entresto 97-103mg  BID, imdur 90mg  daily, farxiga 10mg  daily, hydralazine 100 mg twice daily All of those were continued.  With AKI today, pt's torsemide, Entresto and Farxiga were held.  Okay to continue Toprol, Cardizem and hydralazine. 08-10-2023 BP acceptable on Toprol-XL 200 mg every day, cardizem-CD 360 mg every day, hydralazine 100 mg bid. 08-11-2023 stable on Toprol-XL 200 mg every day, cardizem-CD 360 mg every day, hydralazine 100 mg bid. 08-12-2023 DC to home on Toprol-XL 200 mg every day, cardizem-CD 360 mg every day, hydralazine 100 mg bid.  demadex 20 mg every day prn +1 lbs weight in 1 day or +3 lbs in 1 week. Confirmed with Dr. Odis Hollingshead.

## 2023-08-10 NOTE — Assessment & Plan Note (Addendum)
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 Also has mobile mass (0.5 cm x 0.8 cm )in the right ventricle suspected either on the chordae or the wall of the ventricle. Will need CT surgery consult.  CT surgery has requested cards consult for RHC/LHC.  08-11-2023 CT surgery and Cardiology consults have both seen patient. Thanks to them for their participation. No plans for RHC/LHC.  Plan on placing PICC line and sending pt home with IV ABX. Indication: MSSA MV IE Regimen: Cefazolin 2g IV every 8 hours End date: 09/17/23 (6 weeks from neg BCx 11/30)  08-12-2023 PICC place on 08-11-2023.  DC to home with 6 weeks of IV Ancef 2 g q8h. End date 09-17-2023.

## 2023-08-10 NOTE — Assessment & Plan Note (Signed)
08-10-2023 intolerant to CPAP

## 2023-08-11 ENCOUNTER — Other Ambulatory Visit (HOSPITAL_COMMUNITY): Payer: Self-pay

## 2023-08-11 ENCOUNTER — Other Ambulatory Visit: Payer: Self-pay

## 2023-08-11 ENCOUNTER — Telehealth (HOSPITAL_BASED_OUTPATIENT_CLINIC_OR_DEPARTMENT_OTHER): Payer: Self-pay | Admitting: *Deleted

## 2023-08-11 DIAGNOSIS — R7881 Bacteremia: Secondary | ICD-10-CM | POA: Diagnosis not present

## 2023-08-11 DIAGNOSIS — J189 Pneumonia, unspecified organism: Secondary | ICD-10-CM | POA: Diagnosis not present

## 2023-08-11 DIAGNOSIS — B9561 Methicillin susceptible Staphylococcus aureus infection as the cause of diseases classified elsewhere: Secondary | ICD-10-CM | POA: Diagnosis not present

## 2023-08-11 DIAGNOSIS — I5032 Chronic diastolic (congestive) heart failure: Secondary | ICD-10-CM | POA: Diagnosis not present

## 2023-08-11 DIAGNOSIS — I059 Rheumatic mitral valve disease, unspecified: Secondary | ICD-10-CM | POA: Diagnosis not present

## 2023-08-11 LAB — LIPID PANEL
Cholesterol: 115 mg/dL (ref 0–200)
HDL: 35 mg/dL — ABNORMAL LOW (ref >40)
LDL Cholesterol: 58 mg/dL (ref 0–99)
Total CHOL/HDL Ratio: 3.3 {ratio}
Triglycerides: 111 mg/dL (ref <150)
VLDL: 22 mg/dL (ref 0–40)

## 2023-08-11 LAB — BASIC METABOLIC PANEL
Anion gap: 7 (ref 5–15)
BUN: 12 mg/dL (ref 8–23)
CO2: 27 mmol/L (ref 22–32)
Calcium: 9.1 mg/dL (ref 8.9–10.3)
Chloride: 104 mmol/L (ref 98–111)
Creatinine, Ser: 1.84 mg/dL — ABNORMAL HIGH (ref 0.61–1.24)
GFR, Estimated: 40 mL/min — ABNORMAL LOW (ref >60)
Glucose, Bld: 98 mg/dL (ref 70–99)
Potassium: 3.7 mmol/L (ref 3.5–5.1)
Sodium: 138 mmol/L (ref 135–145)

## 2023-08-11 LAB — CULTURE, BLOOD (ROUTINE X 2)
Culture: NO GROWTH
Culture: NO GROWTH
Special Requests: ADEQUATE

## 2023-08-11 LAB — GLUCOSE, CAPILLARY: Glucose-Capillary: 105 mg/dL — ABNORMAL HIGH (ref 70–99)

## 2023-08-11 MED ORDER — ORAL CARE MOUTH RINSE: 15.0000 mL | OROMUCOSAL | Status: DC | PRN

## 2023-08-11 MED ORDER — SODIUM CHLORIDE 0.9% FLUSH: 10.0000 mL | INTRAVENOUS | Status: DC | PRN

## 2023-08-11 MED ORDER — CHLORHEXIDINE GLUCONATE CLOTH 2 % EX PADS
6.0000 | MEDICATED_PAD | Freq: Every day | CUTANEOUS | Status: DC
  Administered 2023-08-11 – 2023-08-12 (×2): 6 via TOPICAL

## 2023-08-11 NOTE — Progress Notes (Signed)
PROGRESS NOTE    JAYJAY VIRGIL  NFA:[id number] DOB: 1956/05/10 DOA: 08/06/2023 PCP: Creola Corn, MD  Subjective: Pt seen and examined.   CT surgery and cardiology consult notes read, reviewed and very much appreciated.  Pt aware he does not need RHC/LHC or mitral valve surgery.  However, he is convinced that he suffered CHF from IV Ancef in 2022 and it hesitant about going back on this therapy again.  I have messaged Dr. Luciana Axe with ID to come and discuss pt's IV ABX therapy with him.  Pt agreeable to having PICC placed. He also wants to talk to CM about his co-pays with his home IV Abx.   Hospital Course: HPI: ABHIRAM GOZA is a 67 y.o. male with medical history significant of chronic HFpEF, hypertension, CAD, OSA intolerant to CPAP, hyperlipidemia, history of cardiac arrest (2002), history of PE (completed Xarelto therapy), history of CVA, sarcoidosis, stage III CKD, prior history of MSSA bacteremia (11/2020) presenting to the ED after having positive blood cultures for MSSA.   Patient reports that he was experiencing headaches, cough, congestion, chills starting this past Friday morning.  He also experienced some chest pain.  Wife checked his blood pressure and found that it was elevated and thus subsequently called EMS to bring him to the hospital yesterday.  EMS gave him aspirin and placed him on oxygen which relieved his chest pain.  Workup in the ED did not reveal any ACS.  CT angio chest abdomen pelvis did not reveal any PE but did show likely right middle lobe and right lower lobe pneumonia.  He was subsequently sent home with oral doxycycline.  Blood cultures drawn yesterday returned positive for MSSA today and patient received a call from ID advising him to return to the ED.   Today, he notes that his previous symptoms are somewhat better although they are still present.  He has no longer having a headache or chest pain.  He does continue to have cough productive of yellow/brown  sputum although amount of sputum has improved since yesterday.  He does continue to endorse some congestion and chills.  Denies any fevers, shortness of breath, palpitations, chest pain, abdominal pain, urinary changes, weakness.  He denies any recent sick contacts although he was recently together with family for Thanksgiving.   ED course: CBC and CMP fairly unremarkable, kidney function is around his baseline of CKD 3 (baseline EGFR 45-50).  Lactic acid negative x 2.  Chest x-ray revealed stable bronchopneumonia at lung bases.  Blood cultures redrawn today x 2, pending.  [facility] hospital[facility] asked to evaluate patient for admission.  Significant Events: Admitted 08/06/2023 for MSSA bacteremia, CAP   Significant Labs: 08-05-2023 blood cx growing MSSA Admission WBC 10.4, BUN 10, Scr 1.63  Significant Imaging Studies: CTA chest/abd/pelvis showed NO dissection. Showed RML/RLL/LUL/lingula ground glass opacities. 3 vessel CAD 08-06-2023 MRI T/L spine no acute infection 08-09-2023 TEE showed mobile mass in RV. Either on chordae or RV wall. Also fibrinous mobile mass of mitral valve  Antibiotic Therapy: Anti-infectives (From admission, onward)    Start     Dose/Rate Route Frequency Ordered Stop   08/07/23 1400  cefTRIAXone (ROCEPHIN) 1 g in sodium chloride 0.9 % 100 mL IVPB  Status:  Discontinued        1 g 200 mL/hr over 30 Minutes Intravenous Every 24 hours 08/06/23 1534 08/06/23 1741   08/07/23 1400  ceFAZolin (ANCEF) IVPB 2g/100 mL premix        2 g 200  mL/hr over 30 Minutes Intravenous Every 8 hours 08/06/23 1741     08/06/23 2200  doxycycline (VIBRA-TABS) tablet 100 mg  Status:  Discontinued        100 mg Oral Every 12 hours 08/06/23 1534 08/07/23 1842   08/06/23 1315  cefTRIAXone (ROCEPHIN) 1 g in sodium chloride 0.9 % 100 mL IVPB  Status:  Discontinued        1 g 200 mL/hr over 30 Minutes Intravenous  Once 08/06/23 1311 08/06/23 1312   08/06/23 1315  cefTRIAXone (ROCEPHIN) 2 g in sodium  chloride 0.9 % 100 mL IVPB        2 g 200 mL/hr over 30 Minutes Intravenous  Once 08/06/23 1312 08/06/23 1411   08/06/23 1145  cefTRIAXone (ROCEPHIN) 2 g in sodium chloride 0.9 % 100 mL IVPB  Status:  Discontinued        2 g 200 mL/hr over 30 Minutes Intravenous Every 24 hours 08/06/23 1141 08/06/23 1143   08/06/23 1145  doxycycline (VIBRA-TABS) tablet 100 mg        100 mg Oral  Once 08/06/23 1143 08/06/23 1214       Procedures: 08-09-2023 TEE showed mobile mass in RV. Either on chordae or RV wall. Also fibrinous mobile mass of mitral valve  Consultants: ID Cardiology Cardiothoracic surgery    Assessment and Plan: * Bacteremia due to Staphylococcus aureus Since admission until 08-09-2023 Blood culture sent this admission it is also showing gram-positive cocci Currently on cefazolin a per ID recommendation. Surface echo did not show any evidence of vegetation.  Underwent TEE today.  Pending report MRI thoracic and lumbar did not show any evidence of active infection. Currently on Mucinex twice daily for cough Currently not on supplemental oxygen.  Monitor 08-10-2023 blood cx grew MSSA. On IV Ancef per ID. 08-11-2023 CT surgery and Cardiology consults have both seen patient. Thanks to them for their participation. No plans for RHC/LHC.  Plan on placing PICC line and sending pt home with IV ABX. Indication: MSSA MV IE Regimen: Cefazolin 2g IV every 8 hours End date: 09/17/23 (6 weeks from neg BCx 11/30)  Endocarditis of mitral valve 08-10-2023 TEE on 08-09-2023 shows endocarditis of mitral valve. Also has mobile mass (0.5 cm x 0.8 cm )in the right ventricle suspected either on the chordae or the wall of the ventricle. Will need CT surgery consult.  CT surgery has requested cards consult for RHC/LHC.  08-11-2023 CT surgery and Cardiology consults have both seen patient. Thanks to them for their participation. No plans for RHC/LHC.  Plan on placing PICC line and sending pt home with IV  ABX. Indication: MSSA MV IE Regimen: Cefazolin 2g IV every 8 hours End date: 09/17/23 (6 weeks from neg BCx 11/30)  Community acquired pneumonia 08-10-2023 remains on IV Ancef 08-11-2023 treated with IV ancef  History of pulmonary embolism 08-10-2023 not on systemic anticoagulation.  HLD (hyperlipidemia) 08-10-2023 continue with lipitor 20 mg qday  Coronary artery disease involving native coronary artery of native heart without angina pectoris 08-10-2023 H/o cardiac arrest 2002. Continue aspirin, statin.  (HFpEF) heart failure with preserved ejection fraction (HCC) Since admission until 08-09-2023 Bilateral pedal edema  Last ECHO with LVEF 55-60% with G1DD. Follows up with [facility] cardiology PTA meds- Toprol XL 200 mg daily, Cardizem 360 mg daily, torsemide 20mg  daily, entresto 97-103mg  BID, imdur 90mg  daily, farxiga 10mg  daily, hydralazine 100 mg twice daily All of those were continued.  With AKI today, pt's torsemide, Entresto and Farxiga were  held.  Molli Knock to continue Toprol, Cardizem and hydralazine.  08-11-2023. Continue with Toprol-XL 200 mg every day, cardizem-CD 360 mg every day, hydralazine 100 mg bid. Will change torsemide to prn at discharge. Continue to hold farxiga and entresto. Will get pt an followup appointment with cardiology so they can resume this medication after discharge.  Prolonged QT interval Since admission until 08-09-2023 QT 560 / Qtc 592.  Likely due to low electrolytes avoid QT prolonging medications as able Electrolytes replaced as above Repeat EKG 12/2 showed QTc improved to 446 ms 08-10-2023 stable.  OSA (obstructive sleep apnea) 08-10-2023 intolerant to CPAP   Stage 3b chronic kidney disease - baseline SCr 1.6-1.9 Since admission until 08-09-2023 Creatinine at baseline less than 1.5.  In the last 24 hours, creatinine elevated to 2.43 Start normal saline at 75 mL/h Hold torsemide, Clifton Custard 08-10-2023 Scr 1.77 today.  Back at baseline 1.6-1.9.  continue to hold diuretics for now. Stop IVF 08-11-2023 stable now.  Essential hypertension Since admission until 08-09-2023 Bilateral pedal edema  Last ECHO with LVEF 55-60% with G1DD. Follows up with [facility] cardiology PTA meds- Toprol XL 200 mg daily, Cardizem 360 mg daily, torsemide 20mg  daily, entresto 97-103mg  BID, imdur 90mg  daily, farxiga 10mg  daily, hydralazine 100 mg twice daily All of those were continued.  With AKI today, pt's torsemide, Entresto and Farxiga were held.  Okay to continue Toprol, Cardizem and hydralazine. 08-10-2023 BP acceptable on Toprol-XL 200 mg every day, cardizem-CD 360 mg every day, hydralazine 100 mg bid. 08-11-2023 stable on Toprol-XL 200 mg every day, cardizem-CD 360 mg every day, hydralazine 100 mg bid.  BPH (benign prostatic hyperplasia) 08-10-2023 continue with flomax 0.4 mg qday  Hypokalemia 08-10-2023 replete with po kcl.       DVT prophylaxis: heparin injection 5,000 Units Start: 08/09/23 1000    Code Status: Full Code Family Communication: no family at bedside Disposition Plan: return home Reason for continuing need for hospitalization: medically stable for DC. Needs ID to talk to him about home IV abx therapy/dose/duration.   Objective: Vitals:   08/10/23 1943 08/11/23 0523 08/11/23 0600 08/11/23 0725  BP: (!) 145/97 (!) 141/102 132/80 134/86  Pulse: (!) 59 (!) 58  (!) 55  Resp: 18 18  18   Temp: 100 F (37.8 C) 97.9 F (36.6 C)  97.8 F (36.6 C)  TempSrc: Oral Oral  Oral  SpO2: 95% 96%  95%  Weight:      Height:        Intake/Output Summary (Last 24 hours) at 08/11/2023 1142 Last data filed at 08/11/2023 0806 Gross per 24 hour  Intake 336 ml  Output 1100 ml  Net -764 ml   Filed Weights   08/08/23 0414 08/09/23 0602 08/10/23 0334  Weight: 99.2 kg 97.9 kg 97.8 kg    Examination:  Physical Exam Vitals and nursing note reviewed.  Constitutional:      General: He is not in acute distress.    Appearance: He is obese. He  is not toxic-appearing.  HENT:     Head: Normocephalic and atraumatic.     Nose: Nose normal.  Eyes:     General: No scleral icterus. Cardiovascular:     Rate and Rhythm: Normal rate and regular rhythm.     Heart sounds: Murmur heard.  Pulmonary:     Effort: Pulmonary effort is normal.     Breath sounds: Normal breath sounds.  Abdominal:     General: Bowel sounds are normal. There is no distension.  Palpations: Abdomen is soft.  Musculoskeletal:     Right lower leg: No edema.     Left lower leg: No edema.  Skin:    General: Skin is warm and dry.     Capillary Refill: Capillary refill takes less than 2 seconds.  Neurological:     General: No focal deficit present.     Mental Status: He is alert and oriented to person, place, and time.     Data Reviewed: I have personally reviewed following labs and imaging studies  CBC: Recent Labs  Lab 08/05/23 1400 08/05/23 1406 08/05/23 1439 08/06/23 1218 08/07/23 0523 08/09/23 0541  WBC 11.4*  --   --  10.4 6.9 6.3  NEUTROABS 9.4*  --   --  8.2*  --  3.6  HGB 12.3* 10.9* 13.6 13.7 12.9* 14.3  HCT 36.6* 32.0* 40.0 40.5 38.7* 42.2  MCV 95.6  --   --  92.3 92.1 91.3  PLT 156  --   --  192 176 212   Basic Metabolic Panel: Recent Labs  Lab 08/05/23 1400 08/05/23 1406 08/06/23 1218 08/07/23 0523 08/07/23 0916 08/09/23 0541 08/10/23 1055 08/11/23 0644  NA 143   < > 140 140  --  137 139 138  K 2.7*   < > 3.9 3.4*  --  3.2* 3.2* 3.7  CL 117*   < > 109 108  --  101 107 104  CO2 17*  --  25 26  --  27 25 27   GLUCOSE 92   < > 112* 98  --  94 128* 98  BUN 10   < > 10 9  --  14 12 12   CREATININE 1.33*   < > 1.63* 1.63*  --  2.43* 1.77* 1.84*  CALCIUM 6.9*  --  9.1 8.9  --  9.2 8.7* 9.1  MG 1.6*  --   --   --  2.1  --   --   --   PHOS  --   --   --   --  2.6  --   --   --    < > = values in this interval not displayed.   GFR: Estimated Creatinine Clearance: 47.9 mL/min (A) (by C-G formula based on SCr of 1.84 mg/dL  (H)). Liver Function Tests: Recent Labs  Lab 08/05/23 1400 08/06/23 1218  AST 14* 17  ALT 10 12  ALKPHOS 59 65  BILITOT 0.7 0.8  PROT 5.0* 6.6  ALBUMIN 2.7* 3.4*   Coagulation Profile: Recent Labs  Lab 08/05/23 1400 08/06/23 1218  INR 1.2 1.2   BNP (last 3 results) Recent Labs    05/17/23 0801 08/05/23 1400  BNP 507.0* 1,648.2*   CBG: Recent Labs  Lab 08/07/23 0559 08/08/23 0729 08/09/23 0618 08/10/23 0601 08/11/23 0519  GLUCAP 107* 129* 103* 107* 105*   Lipid Profile: Recent Labs    08/11/23 0644  CHOL 115  HDL 35*  LDLCALC 58  TRIG 960  CHOLHDL 3.3   Sepsis Labs: Recent Labs  Lab 08/05/23 1406 08/05/23 1601 08/06/23 1226 08/06/23 1342  LATICACIDVEN 0.7 <0.3* 0.6 0.6    Recent Results (from the past 240 hour(s))  SARS Coronavirus 2 by RT PCR (hospital order, performed in [facility] hospital lab) *cepheid single result test*     Status: None   Collection Time: 08/05/23  1:58 PM   Specimen: Nasal Swab  Result Value Ref Range Status   SARS Coronavirus 2 by RT PCR NEGATIVE NEGATIVE  Final    Comment: Performed at [facility] Lab, [address] [place], [place] [address]  Culture, blood (routine x 2)     Status: Abnormal   Collection Time: 08/05/23  2:00 PM   Specimen: BLOOD  Result Value Ref Range Status   Specimen Description BLOOD BLOOD LEFT ARM  Final   Special Requests   Final    BOTTLES DRAWN AEROBIC AND ANAEROBIC Blood Culture results may not be optimal due to an inadequate volume of blood received in culture bottles   Culture  Setup Time   Final    GRAM POSITIVE COCCI IN BOTH AEROBIC AND ANAEROBIC BOTTLES CRITICAL RESULT CALLED TO, READ BACK BY AND VERIFIED WITH: OLDLAND RN 08/06/2023 @ 0703 BY AB Performed at [facility], [address] [place], [place] [address]    Culture STAPHYLOCOCCUS AUREUS STREPTOCOCCUS SPECIES  (A)  Final   Report Status 08/10/2023 FINAL  Final   Organism ID, Bacteria STAPHYLOCOCCUS AUREUS   Final   Organism ID, Bacteria STREPTOCOCCUS SPECIES  Final      Susceptibility   Staphylococcus aureus - MIC*    CIPROFLOXACIN 1 SENSITIVE Sensitive     ERYTHROMYCIN >=8 RESISTANT Resistant     GENTAMICIN <=0.5 SENSITIVE Sensitive     OXACILLIN 0.5 SENSITIVE Sensitive     TETRACYCLINE <=1 SENSITIVE Sensitive     VANCOMYCIN 1 SENSITIVE Sensitive     TRIMETH/SULFA <=10 SENSITIVE Sensitive     CLINDAMYCIN RESISTANT Resistant     RIFAMPIN <=0.5 SENSITIVE Sensitive     Inducible Clindamycin POSITIVE Resistant     LINEZOLID 2 SENSITIVE Sensitive     * STAPHYLOCOCCUS AUREUS   Streptococcus species - MIC*    PENICILLIN 0.5 INTERMEDIATE Intermediate     CEFTRIAXONE 0.25 SENSITIVE Sensitive     ERYTHROMYCIN <=0.12 SENSITIVE Sensitive     LEVOFLOXACIN 1 SENSITIVE Sensitive     VANCOMYCIN 1 SENSITIVE Sensitive     * STREPTOCOCCUS SPECIES  Blood Culture ID Panel (Reflexed)     Status: Abnormal   Collection Time: 08/05/23  2:00 PM  Result Value Ref Range Status   Enterococcus faecalis NOT DETECTED NOT DETECTED Final   Enterococcus Faecium NOT DETECTED NOT DETECTED Final   Listeria monocytogenes NOT DETECTED NOT DETECTED Final   Staphylococcus species DETECTED (A) NOT DETECTED Final    Comment: CRITICAL RESULT CALLED TO, READ BACK BY AND VERIFIED WITH: OLDLAND RN 08/06/2023 @ 0703 BY AB    Staphylococcus aureus (BCID) DETECTED (A) NOT DETECTED Final    Comment: CRITICAL RESULT CALLED TO, READ BACK BY AND VERIFIED WITH: OLDLAND RN 08/06/2023 @ 0703 BY AB    Staphylococcus epidermidis NOT DETECTED NOT DETECTED Final   Staphylococcus lugdunensis NOT DETECTED NOT DETECTED Final   Streptococcus species NOT DETECTED NOT DETECTED Final   Streptococcus agalactiae NOT DETECTED NOT DETECTED Final   Streptococcus pneumoniae NOT DETECTED NOT DETECTED Final   Streptococcus pyogenes NOT DETECTED NOT DETECTED Final   A.calcoaceticus-baumannii NOT DETECTED NOT DETECTED Final   Bacteroides fragilis NOT  DETECTED NOT DETECTED Final   Enterobacterales NOT DETECTED NOT DETECTED Final   Enterobacter cloacae complex NOT DETECTED NOT DETECTED Final   Escherichia coli NOT DETECTED NOT DETECTED Final   Klebsiella aerogenes NOT DETECTED NOT DETECTED Final   Klebsiella oxytoca NOT DETECTED NOT DETECTED Final   Klebsiella pneumoniae NOT DETECTED NOT DETECTED Final   Proteus species NOT DETECTED NOT DETECTED Final   Salmonella species NOT DETECTED NOT DETECTED Final  Serratia marcescens NOT DETECTED NOT DETECTED Final   Haemophilus influenzae NOT DETECTED NOT DETECTED Final   Neisseria meningitidis NOT DETECTED NOT DETECTED Final   Pseudomonas aeruginosa NOT DETECTED NOT DETECTED Final   Stenotrophomonas maltophilia NOT DETECTED NOT DETECTED Final   Candida albicans NOT DETECTED NOT DETECTED Final   Candida auris NOT DETECTED NOT DETECTED Final   Candida glabrata NOT DETECTED NOT DETECTED Final   Candida krusei NOT DETECTED NOT DETECTED Final   Candida parapsilosis NOT DETECTED NOT DETECTED Final   Candida tropicalis NOT DETECTED NOT DETECTED Final   Cryptococcus neoformans/gattii NOT DETECTED NOT DETECTED Final   Meth resistant mecA/C and MREJ NOT DETECTED NOT DETECTED Final    Comment: Performed at [facility], [address] [place], [place] [address]  Culture, blood (routine x 2)     Status: None   Collection Time: 08/05/23  2:12 PM   Specimen: BLOOD  Result Value Ref Range Status   Specimen Description BLOOD BLOOD RIGHT ARM  Final   Special Requests   Final    BOTTLES DRAWN AEROBIC AND ANAEROBIC Blood Culture results may not be optimal due to an inadequate volume of blood received in culture bottles   Culture   Final    NO GROWTH 5 DAYS Performed at [facility], [address] [place], [place] [address]    Report Status 08/10/2023 FINAL  Final  Blood Culture (routine x 2)     Status: None   Collection Time: 08/06/23 11:38 AM   Specimen: BLOOD LEFT HAND  Result Value  Ref Range Status   Specimen Description BLOOD LEFT HAND  Final   Special Requests   Final    BOTTLES DRAWN AEROBIC AND ANAEROBIC Blood Culture results may not be optimal due to an inadequate volume of blood received in culture bottles   Culture   Final    NO GROWTH 5 DAYS Performed at [facility], [address] [place], [place] [address]    Report Status 08/11/2023 FINAL  Final  Blood Culture (routine x 2)     Status: None   Collection Time: 08/06/23 11:43 AM   Specimen: BLOOD  Result Value Ref Range Status   Specimen Description BLOOD RIGHT ANTECUBITAL  Final   Special Requests   Final    BOTTLES DRAWN AEROBIC AND ANAEROBIC Blood Culture adequate volume   Culture   Final    NO GROWTH 5 DAYS Performed at [facility], [address], [place], [place] [address]    Report Status 08/11/2023 FINAL  Final  Respiratory (~20 pathogens) panel by PCR     Status: None   Collection Time: 08/07/23  5:50 AM   Specimen: Nasopharyngeal Swab; Respiratory  Result Value Ref Range Status   Adenovirus NOT DETECTED NOT DETECTED Final   Coronavirus 229E NOT DETECTED NOT DETECTED Final    Comment: (NOTE) The Coronavirus on the Respiratory Panel, DOES NOT test for the novel  Coronavirus (2019 nCoV)    Coronavirus HKU1 NOT DETECTED NOT DETECTED Final   Coronavirus NL63 NOT DETECTED NOT DETECTED Final   Coronavirus OC43 NOT DETECTED NOT DETECTED Final   Metapneumovirus NOT DETECTED NOT DETECTED Final   Rhinovirus / Enterovirus NOT DETECTED NOT DETECTED Final   Influenza A NOT DETECTED NOT DETECTED Final   Influenza B NOT DETECTED NOT DETECTED Final   Parainfluenza Virus 1 NOT DETECTED NOT DETECTED Final   Parainfluenza Virus 2 NOT DETECTED NOT DETECTED Final  Parainfluenza Virus 3 NOT DETECTED NOT DETECTED Final   Parainfluenza Virus 4 NOT DETECTED NOT DETECTED Final   Respiratory Syncytial Virus NOT DETECTED NOT DETECTED Final   Bordetella pertussis NOT DETECTED NOT DETECTED Final    Bordetella Parapertussis NOT DETECTED NOT DETECTED Final   Chlamydophila pneumoniae NOT DETECTED NOT DETECTED Final   Mycoplasma pneumoniae NOT DETECTED NOT DETECTED Final    Comment: Performed at [facility] Lab, [address], [place], [place] [address]  Culture, Respiratory w Gram Stain     Status: None   Collection Time: 08/07/23  7:36 AM   Specimen: SPU; Respiratory  Result Value Ref Range Status   Specimen Description SPUTUM  Final   Special Requests NONE  Final   Gram Stain   Final    MODERATE WBC PRESENT, PREDOMINANTLY PMN FEW GRAM POSITIVE COCCI RARE GRAM POSITIVE RODS    Culture   Final    MODERATE Normal respiratory flora-no Staph aureus or Pseudomonas seen Performed at [facility], [address], [place], [place] [address]    Report Status 08/09/2023 FINAL  Final     Radiology Studies: [place] EKG SITE RITE  Result Date: 08/11/2023 If Site Rite image not attached, placement could not be confirmed due to current cardiac rhythm.  [place] EKG SITE RITE  Result Date: 08/10/2023 If Site Rite image not attached, placement could not be confirmed due to current cardiac rhythm.   Scheduled Meds:  aspirin EC  81 mg Oral Daily   atorvastatin  20 mg Oral Daily   Chlorhexidine Gluconate Cloth  6 each Topical Daily   dextromethorphan-guaiFENesin  1 tablet Oral BID   diltiazem  360 mg Oral Daily   heparin injection (subcutaneous)  5,000 Units Subcutaneous Q12H   hydrALAZINE  100 mg Oral BID   isosorbide mononitrate  90 mg Oral Daily   metoprolol  200 mg Oral Daily   sodium chloride flush  10 mL Intravenous Q12H   tamsulosin  0.4 mg Oral QPC breakfast   Continuous Infusions:   ceFAZolin (ANCEF) IV 2 g (08/11/23 0556)     LOS: 5 days   Time spent: 40 minutes  Carollee Herter, DO  [facility]  08/11/2023, 11:42 AM

## 2023-08-11 NOTE — Progress Notes (Addendum)
I cam by to speak with [name] as he had ongoing questions and concerns for the IV abx plan.   He alludes to the past in 2022 when he had acute CHF exacerbation while on IV abx during a 4 week course of tx for MSSA bacteremia. I reviewed the notes and explained this was more due to the mechanism of extra IV fluids NOT the antibiotics themselves as he was able to complete a full course and improved with diuresis.   I proposed that we help to get him arranged to see his cardiology team within 7-10 days hopefully so we can all work as a team to manage his infection and his fluid status.  Also spoke about following daily weights and paying attention to quick trends of > 2 lb in 24h or > 5 lbs over a week, reducing oral fluid intake, etc.   He says he wants to go home but can't make the decision without his wife. We tried to call [name] during my visit but he said to try again at 2: 00 pm - I will come back at that time to try again.   PICC in place, Rt wrist is fine today and w/o concern.  Will send message to primary and try to reach his cardiologist outpatient to help facilitate close follow up     2:35 PM  After speaking with [name] in person and his wife over the phone who is en route to the hospital I don't think I feel comfortable with them being discharged today. [name] was not prepared to take him home yet and she has a lot of questions about maintenance meds, fluid restrictions, etc in hopes to avoid fluid retention as such happened before.   I told her I reached out to cardiology team to request follow up in 7d so they should be calling for an appointment soon. I encouraged them to keep a log of daily weights to trend. PICC is in place. All questions answered from infection standpoint and would recommend no changes in his plan to continue the ancef TID (0600 - 1400 - 2200) outpatient to treat his heart valve infection. I suggested to start with 2L daily fluid (maybe less 1 cup to account  for IV fluids) and follow weights.   D/W [name] with case management who agrees we should wait on D/C until there is more confidence in the plan for home IV abx administration. Dr. Imogene Hall updated.   Total encounter time cumulatively spent 50 minutes including face to face, professional time spent in chart and collaboration with multidisciplinary team.    Manuel Alberts, MSN, NP-C [facility]  [email or web address] Pager: [phone] Office: [phone] [facility] Main Line: [phone] *Secure Chat Communication Welcome

## 2023-08-11 NOTE — Progress Notes (Signed)
  Met with pt and wife(peggy) at bedside. She states pt has not been taking demadex for at least 3 weeks now. Pt ran out and did not tell anyone. He also ran out of hydralazine. Unknown how long he hasn't been taking it.  Will plan on renewing all his home meds and have them delivered to bedside tomorrow.  With his CKD stage 3b, I'm hesitant to restart his Sherryll Burger and Marcelline Deist as he is so close to being discharged. His AKI has resolved(Scr up to 2.43). this improved with IVF and cessation of entresto and farxiga.  Will discharge him on prn demadex for water weight gain or LE edema.  He will f/u with Dr. Odis Hollingshead in cards clinic in 1 week to decide if/when pt can restart Entresto vs Farxiga.  I would not start both at the same time. He will need repeat BMP 1 week after restarting Entresto or Farxiga.   Carollee Herter, DO [facility]

## 2023-08-11 NOTE — Telephone Encounter (Signed)
Post ED Visit - Positive Culture Follow-up  Culture report reviewed by antimicrobial stewardship pharmacist: [facility] [x]  Daylene Posey Pharm.D. []  Celedonio Miyamoto, Pharm.D., BCPS AQ-ID []  Garvin Fila, Pharm.D., BCPS []  Georgina Pillion, Pharm.D., BCPS []  [place], [place].D., BCPS, AAHIVP []  Estella Husk, Pharm.D., BCPS, AAHIVP []  Lysle Pearl, PharmD, BCPS []  Phillips Climes, PharmD, BCPS []  Agapito Games, PharmD, BCPS []  Verlan Friends, PharmD []  Mervyn Gay, PharmD, BCPS []  Vinnie Level, PharmD  [facility] []  Len Childs, PharmD []  Greer Pickerel, PharmD []  Adalberto Cole, PharmD []  Perlie Gold, Rph []  Lonell Face) Jean Rosenthal, PharmD []  Earl Many, PharmD []  Junita Push, PharmD []  Dorna Leitz, PharmD []  Terrilee Files, PharmD []  Lynann Beaver, PharmD []  Keturah Barre, PharmD []  Loralee Pacas, PharmD []  Bernadene Person, PharmD   Positive blood culture Currently admitted to [facility] for treatment  and no further patient follow-up is required at this time.  Virl Axe [facility] 08/11/2023, 8:43 AM

## 2023-08-11 NOTE — Progress Notes (Signed)
Peripherally Inserted Central Catheter Placement  The IV Nurse has discussed with the patient and/or persons authorized to consent for the patient, the purpose of this procedure and the potential benefits and risks involved with this procedure.  The benefits include less needle sticks, lab draws from the catheter, and the patient may be discharged home with the catheter. Risks include, but not limited to, infection, bleeding, blood clot (thrombus formation), and puncture of an artery; nerve damage and irregular heartbeat and possibility to perform a PICC exchange if needed/ordered by physician.  Alternatives to this procedure were also discussed.  Bard Power PICC patient education guide, fact sheet on infection prevention and patient information card has been provided to patient /or left at bedside.  PICC inserted by Reginia Forts, RN  PICC Placement Documentation  PICC Single Lumen 08/11/23 Right Cephalic 45 cm 0 cm (Active)  Indication for Insertion or Continuance of Line Prolonged intravenous therapies;Home intravenous therapies (PICC only) 08/11/23 1125  Exposed Catheter (cm) 0 cm 08/11/23 1125  Site Assessment Clean, Dry, Intact 08/11/23 1125  Line Status Flushed;Saline locked;Blood return noted 08/11/23 1125  Dressing Type Transparent;Securing device 08/11/23 1125  Dressing Status Antimicrobial disc in place;Clean, Dry, Intact 08/11/23 1125  Line Care Connections checked and tightened 08/11/23 1125  Line Adjustment (NICU/IV Team Only) No 08/11/23 1125  Dressing Intervention New dressing;Adhesive placed at insertion site (IV team only) 08/11/23 1125  Dressing Change Due 08/18/23 08/11/23 1125       [name], [name] 08/11/2023, 11:26 AM

## 2023-08-11 NOTE — Progress Notes (Signed)
Went to place the PICC. Upon explaining to patient about PICC risk and benefits, patients wants to talk to the provider first. RN will put IV team consult when patient is agreeable for PICC placement.

## 2023-08-11 NOTE — TOC Progression Note (Addendum)
Transition of Care [facility]) - Progression Note    Patient Details  Name: [name] MRN: [id number] Date of Birth: 1955/10/07  Transition of Care [facility]) CM/SW Contact  Janae Bridgeman, RN Phone Number: 08/11/2023, 10:13 AM  Clinical Narrative:    Cm spoke with bedside nursing during progression rounds and she states that the patient turned the IV Team away when they came to the room to place the PICC line.   Attending MD was notified and he states that ID MD plans to come and speak with the patient today regarding needed home IV antibiotic recommendations.  Jeri Modena, RNCM states that she taught the patient IV infusion technique last night and patient was knowledgeable regarding home infusion.  Patient has family support for teaching in the home as well.  [facility] is following the patient for RN [facility].  I will follow up with bedside nursing and [facility] regarding discharge plans today.    08/11/23 1230 - Patient has Right arm PICC line in place and is still waiting on Infectious Disease MD to speak with him.    [facility] states that patient will need IV dose to antibiotic given at 2 pm today before he goes home.  CM will continue to follow the patient for TOC needs.  08/11/23 1400 - I met with [place], NP and the patient at the bedside to discuss TOC needs for home.  We were able to call and speak with the patient's wife by phone and she states that she would be more comfortable taking the patient home tomorrow.  [facility] was notified that patient would discharge home tomorrow.  I spoke with the wife briefly by phone but plan to meet with her at the bedside to update her regarding pending discharge to home tomorrow.   IV antibiotics delivery through [facility] starting tomorrow and that patient and wife will be providing the IV antibiotic 3 times a day.    Dr. Emelda Brothers office was called by Durwin Nora, NP to call the family for follow up at the office in the next week.  Patient's wife states that  they have a scale at the home and plans to continue daily weights and recordings at home.  [facility] will provide HH RN at the home when patient's is discharged for supportive teaching and PICC line dressing care.  08/11/23 1629 - Patient's wife was present at the bedside.  I reviewed with her the plan to discharge to home tomorrow with [facility] to provide the IV antibiotics and [facility] will reach out to schedule [facility] RN.  Patient's wife plans to pick patient up for discharge to home tomorrow around 1230.  Jeri Modena is aware and is calling the patient's wife to review the infusion technique and arrange delivery of meds to the home.  MD was updated regarding likely discharge to home tomorrow around 1230.  ID was included in the conversation with bedside nursing.        Expected Discharge Plan and Services                                               Social Determinants of Health (SDOH) Interventions SDOH Screenings   Food Insecurity: No Food Insecurity (08/06/2023)  Housing: Low Risk  (08/06/2023)  Transportation Needs: No Transportation Needs (08/06/2023)  Utilities: Not At Risk (08/06/2023)  Depression (PHQ2-9): Low Risk  (01/22/2021)  Financial  Resource Strain: Low Risk  (04/21/2021)  Social Connections: Unknown (01/14/2022)   Received from [facility]  Tobacco Use: Medium Risk (08/09/2023)    Readmission Risk Interventions    08/10/2023   10:49 AM 08/09/2023    3:05 PM 05/19/2023   11:32 AM  Readmission Risk Prevention Plan  Post Dischage Appt   Complete  Medication Screening   Complete  Transportation Screening Complete Complete Complete  PCP or Specialist Appt within 5-7 Days Complete Complete   Home Care Screening Complete Complete   Medication Review (RN CM) Complete Complete

## 2023-08-11 NOTE — Plan of Care (Signed)
  Problem: Education: Goal: Knowledge of General Education information will improve Description: Including pain rating scale, medication(s)/side effects and non-pharmacologic comfort measures Outcome: Progressing   Problem: Health Behavior/Discharge Planning: Goal: Ability to manage health-related needs will improve Outcome: Progressing   Problem: Clinical Measurements: Goal: Ability to maintain clinical measurements within normal limits will improve Outcome: Progressing Goal: Will remain free from infection Outcome: Progressing Goal: Diagnostic test results will improve Outcome: Progressing Goal: Respiratory complications will improve Outcome: Progressing Goal: Cardiovascular complication will be avoided Outcome: Progressing   Problem: Activity: Goal: Risk for activity intolerance will decrease Outcome: Progressing   Problem: Nutrition: Goal: Adequate nutrition will be maintained Outcome: Progressing   Problem: Coping: Goal: Level of anxiety will decrease Outcome: Progressing   Problem: Elimination: Goal: Will not experience complications related to bowel motility Outcome: Progressing Goal: Will not experience complications related to urinary retention Outcome: Progressing   Problem: Pain Management: Goal: General experience of comfort will improve Outcome: Progressing   Problem: Safety: Goal: Ability to remain free from injury will improve Outcome: Progressing   Problem: Skin Integrity: Goal: Risk for impaired skin integrity will decrease Outcome: Progressing   Problem: Education: Goal: Ability to demonstrate management of disease process will improve Outcome: Progressing Goal: Ability to verbalize understanding of medication therapies will improve Outcome: Progressing Goal: Individualized Educational Video(s) Outcome: Progressing   Problem: Activity: Goal: Capacity to carry out activities will improve Outcome: Progressing   Problem: Cardiac: Goal:  Ability to achieve and maintain adequate cardiopulmonary perfusion will improve Outcome: Progressing   Problem: Education: Goal: Knowledge of disease or condition will improve Outcome: Progressing Goal: Knowledge of the prescribed therapeutic regimen will improve Outcome: Progressing Goal: Individualized Educational Video(s) Outcome: Progressing   Problem: Activity: Goal: Ability to tolerate increased activity will improve Outcome: Progressing Goal: Will verbalize the importance of balancing activity with adequate rest periods Outcome: Progressing   Problem: Respiratory: Goal: Ability to maintain a clear airway will improve Outcome: Progressing Goal: Levels of oxygenation will improve Outcome: Progressing Goal: Ability to maintain adequate ventilation will improve Outcome: Progressing   Problem: Activity: Goal: Ability to tolerate increased activity will improve Outcome: Progressing   Problem: Clinical Measurements: Goal: Ability to maintain a body temperature in the normal range will improve Outcome: Progressing   Problem: Respiratory: Goal: Ability to maintain adequate ventilation will improve Outcome: Progressing Goal: Ability to maintain a clear airway will improve Outcome: Progressing

## 2023-08-11 NOTE — Progress Notes (Signed)
Mobility Specialist Progress Note:   08/11/23 0911  Mobility  Activity Ambulated independently in hallway  Level of Assistance Standby assist, set-up cues, supervision of patient - no hands on  Assistive Device None  Distance Ambulated (ft) 375 ft  Activity Response Tolerated well  Mobility Referral Yes  $Mobility charge 1 Mobility  Mobility Specialist Start Time (ACUTE ONLY) 0911  Mobility Specialist Stop Time (ACUTE ONLY) 0917  Mobility Specialist Time Calculation (min) (ACUTE ONLY) 6 min   Pt agreeable to mobility session. Ambulated in hallway with SV. Tolerated well, asx throughout. Returned to room with all needs met.    Feliciana Rossetti Mobility Specialist Please contact via Special educational needs teacher or  Rehab office at [phone]

## 2023-08-11 NOTE — Plan of Care (Signed)
  Problem: Education: Goal: Knowledge of General Education information will improve Description: Including pain rating scale, medication(s)/side effects and non-pharmacologic comfort measures Outcome: Progressing   Problem: Health Behavior/Discharge Planning: Goal: Ability to manage health-related needs will improve Outcome: Progressing   Problem: Clinical Measurements: Goal: Ability to maintain clinical measurements within normal limits will improve Outcome: Progressing Goal: Will remain free from infection Outcome: Progressing Goal: Diagnostic test results will improve Outcome: Progressing Goal: Respiratory complications will improve Outcome: Progressing Goal: Cardiovascular complication will be avoided Outcome: Progressing   Problem: Activity: Goal: Risk for activity intolerance will decrease Outcome: Progressing   Problem: Nutrition: Goal: Adequate nutrition will be maintained Outcome: Progressing   Problem: Coping: Goal: Level of anxiety will decrease Outcome: Progressing   Problem: Elimination: Goal: Will not experience complications related to bowel motility Outcome: Progressing Goal: Will not experience complications related to urinary retention Outcome: Progressing   Problem: Pain Management: Goal: General experience of comfort will improve Outcome: Progressing   Problem: Safety: Goal: Ability to remain free from injury will improve Outcome: Progressing   Problem: Skin Integrity: Goal: Risk for impaired skin integrity will decrease Outcome: Progressing   Problem: Education: Goal: Ability to demonstrate management of disease process will improve Outcome: Progressing Goal: Ability to verbalize understanding of medication therapies will improve Outcome: Progressing Goal: Individualized Educational Video(s) Outcome: Progressing

## 2023-08-12 ENCOUNTER — Other Ambulatory Visit (HOSPITAL_COMMUNITY): Payer: Self-pay

## 2023-08-12 DIAGNOSIS — I2510 Atherosclerotic heart disease of native coronary artery without angina pectoris: Secondary | ICD-10-CM | POA: Diagnosis not present

## 2023-08-12 DIAGNOSIS — I5032 Chronic diastolic (congestive) heart failure: Secondary | ICD-10-CM | POA: Diagnosis not present

## 2023-08-12 DIAGNOSIS — R9431 Abnormal electrocardiogram [ECG] [EKG]: Secondary | ICD-10-CM

## 2023-08-12 DIAGNOSIS — I059 Rheumatic mitral valve disease, unspecified: Secondary | ICD-10-CM | POA: Diagnosis not present

## 2023-08-12 DIAGNOSIS — J189 Pneumonia, unspecified organism: Secondary | ICD-10-CM | POA: Diagnosis not present

## 2023-08-12 DIAGNOSIS — E782 Mixed hyperlipidemia: Secondary | ICD-10-CM

## 2023-08-12 DIAGNOSIS — R7881 Bacteremia: Secondary | ICD-10-CM | POA: Diagnosis not present

## 2023-08-12 LAB — GLUCOSE, CAPILLARY: Glucose-Capillary: 95 mg/dL (ref 70–99)

## 2023-08-12 SURGERY — TRANSESOPHAGEAL ECHOCARDIOGRAM (TEE) (CATHLAB)
Anesthesia: Monitor Anesthesia Care

## 2023-08-12 MED ORDER — HEPARIN SOD (PORK) LOCK FLUSH 100 UNIT/ML IV SOLN
250.0000 [IU] | INTRAVENOUS | Status: AC | PRN
  Administered 2023-08-12: 250 [IU]

## 2023-08-12 MED ORDER — ISOSORBIDE MONONITRATE ER 30 MG PO TB24
90.0000 mg | ORAL_TABLET | Freq: Every day | ORAL | 0 refills | Status: DC
  Filled 2023-08-12: qty 270, 90d supply, fill #0

## 2023-08-12 MED ORDER — HYDRALAZINE HCL 100 MG PO TABS
100.0000 mg | ORAL_TABLET | Freq: Two times a day (BID) | ORAL | 0 refills | Status: DC
  Filled 2023-08-12: qty 180, 90d supply, fill #0

## 2023-08-12 MED ORDER — TRAZODONE HCL 150 MG PO TABS
150.0000 mg | ORAL_TABLET | Freq: Every evening | ORAL | 0 refills | Status: DC | PRN
  Filled 2023-08-12 – 2023-10-16 (×2): qty 90, 90d supply, fill #0

## 2023-08-12 MED ORDER — DILTIAZEM HCL ER COATED BEADS 360 MG PO CP24
360.0000 mg | ORAL_CAPSULE | Freq: Every day | ORAL | 0 refills | Status: DC
  Filled 2023-08-12: qty 90, 90d supply, fill #0

## 2023-08-12 MED ORDER — ATORVASTATIN CALCIUM 20 MG PO TABS
20.0000 mg | ORAL_TABLET | Freq: Every day | ORAL | 0 refills | Status: AC
  Filled 2023-08-12 – 2023-11-11 (×7): qty 90, 90d supply, fill #0

## 2023-08-12 MED ORDER — TAMSULOSIN HCL 0.4 MG PO CAPS
0.4000 mg | ORAL_CAPSULE | Freq: Every day | ORAL | 0 refills | Status: AC
  Filled 2023-08-12: qty 90, 90d supply, fill #0

## 2023-08-12 MED ORDER — METOPROLOL SUCCINATE ER 200 MG PO TB24
200.0000 mg | ORAL_TABLET | Freq: Every day | ORAL | 0 refills | Status: AC
  Filled 2023-08-12: qty 90, 90d supply, fill #0

## 2023-08-12 MED ORDER — TORSEMIDE 20 MG PO TABS
20.0000 mg | ORAL_TABLET | Freq: Every day | ORAL | 0 refills | Status: DC | PRN
  Filled 2023-08-12: qty 90, 90d supply, fill #0

## 2023-08-12 MED ORDER — POTASSIUM CHLORIDE CRYS ER 20 MEQ PO TBCR
20.0000 meq | EXTENDED_RELEASE_TABLET | Freq: Every day | ORAL | 0 refills | Status: DC | PRN
  Filled 2023-08-12: qty 90, 90d supply, fill #0

## 2023-08-12 MED ORDER — IPRATROPIUM-ALBUTEROL 0.5-2.5 (3) MG/3ML IN SOLN
3.0000 mL | Freq: Four times a day (QID) | RESPIRATORY_TRACT | 0 refills | Status: DC | PRN
  Filled 2023-08-12: qty 180, 15d supply, fill #0

## 2023-08-12 NOTE — Progress Notes (Signed)
PROGRESS NOTE    OVID TERRY  ZOX:[id number] DOB: 02-Dec-1955 DOA: 08/06/2023 PCP: Creola Corn, MD  Subjective: Pt seen and examined.   Pt observed walking in the hallways with mobility tech. Does not require any assistance or devices.  Discussed with cards. They will f/u with him in clinic. Will stop [place] for now. Cards will decide on when to restart. Pt will need to restart these meds at some point in the future but his most pressing medical issues to treating his endocarditis with IV ABX for 6 weeks.   Hospital Course: HPI: KHODI BENYO is a 67 y.o. male with medical history significant of chronic HFpEF, hypertension, CAD, OSA intolerant to CPAP, hyperlipidemia, history of cardiac arrest (2002), history of PE (completed Xarelto therapy), history of CVA, sarcoidosis, stage III CKD, prior history of MSSA bacteremia (11/2020) presenting to the ED after having positive blood cultures for MSSA.   Patient reports that he was experiencing headaches, cough, congestion, chills starting this past Friday morning.  He also experienced some chest pain.  Wife checked his blood pressure and found that it was elevated and thus subsequently called EMS to bring him to the hospital yesterday.  EMS gave him aspirin and placed him on oxygen which relieved his chest pain.  Workup in the ED did not reveal any ACS.  CT angio chest abdomen pelvis did not reveal any PE but did show likely right middle lobe and right lower lobe pneumonia.  He was subsequently sent home with oral doxycycline.  Blood cultures drawn yesterday returned positive for MSSA today and patient received a call from ID advising him to return to the ED.   Today, he notes that his previous symptoms are somewhat better although they are still present.  He has no longer having a headache or chest pain.  He does continue to have cough productive of yellow/brown sputum although amount of sputum has improved since yesterday.  He does  continue to endorse some congestion and chills.  Denies any fevers, shortness of breath, palpitations, chest pain, abdominal pain, urinary changes, weakness.  He denies any recent sick contacts although he was recently together with family for Thanksgiving.   ED course: CBC and CMP fairly unremarkable, kidney function is around his baseline of CKD 3 (baseline EGFR 45-50).  Lactic acid negative x 2.  Chest x-ray revealed stable bronchopneumonia at lung bases.  Blood cultures redrawn today x 2, pending.  [facility] hospital[facility] asked to evaluate patient for admission.  Significant Events: Admitted 08/06/2023 for MSSA bacteremia, CAP   Significant Labs: 08-05-2023 blood cx growing MSSA Admission WBC 10.4, BUN 10, Scr 1.63  Significant Imaging Studies: CTA chest/abd/pelvis showed NO dissection. Showed RML/RLL/LUL/lingula ground glass opacities. 3 vessel CAD 08-06-2023 MRI T/L spine no acute infection 08-09-2023 TEE showed mobile mass in RV. Either on chordae or RV wall. Also fibrinous mobile mass of mitral valve  Antibiotic Therapy: Anti-infectives (From admission, onward)    Start     Dose/Rate Route Frequency Ordered Stop   08/07/23 1400  cefTRIAXone (ROCEPHIN) 1 g in sodium chloride 0.9 % 100 mL IVPB  Status:  Discontinued        1 g 200 mL/hr over 30 Minutes Intravenous Every 24 hours 08/06/23 1534 08/06/23 1741   08/07/23 1400  ceFAZolin (ANCEF) IVPB 2g/100 mL premix        2 g 200 mL/hr over 30 Minutes Intravenous Every 8 hours 08/06/23 1741     08/06/23 2200  doxycycline (  VIBRA-TABS) tablet 100 mg  Status:  Discontinued        100 mg Oral Every 12 hours 08/06/23 1534 08/07/23 1842   08/06/23 1315  cefTRIAXone (ROCEPHIN) 1 g in sodium chloride 0.9 % 100 mL IVPB  Status:  Discontinued        1 g 200 mL/hr over 30 Minutes Intravenous  Once 08/06/23 1311 08/06/23 1312   08/06/23 1315  cefTRIAXone (ROCEPHIN) 2 g in sodium chloride 0.9 % 100 mL IVPB        2 g 200 mL/hr over 30 Minutes  Intravenous  Once 08/06/23 1312 08/06/23 1411   08/06/23 1145  cefTRIAXone (ROCEPHIN) 2 g in sodium chloride 0.9 % 100 mL IVPB  Status:  Discontinued        2 g 200 mL/hr over 30 Minutes Intravenous Every 24 hours 08/06/23 1141 08/06/23 1143   08/06/23 1145  doxycycline (VIBRA-TABS) tablet 100 mg        100 mg Oral  Once 08/06/23 1143 08/06/23 1214       Procedures: 08-09-2023 TEE showed mobile mass in RV. Either on chordae or RV wall. Also fibrinous mobile mass of mitral valve  Consultants: ID Cardiology Cardiothoracic surgery    Assessment and Plan: * Bacteremia due to Staphylococcus aureus Since admission until 08-09-2023 Blood culture sent this admission it is also showing gram-positive cocci Currently on cefazolin a per ID recommendation. Surface echo did not show any evidence of vegetation.  Underwent TEE today.  Pending report MRI thoracic and lumbar did not show any evidence of active infection. Currently on Mucinex twice daily for cough Currently not on supplemental oxygen.  Monitor 08-10-2023 blood cx grew MSSA. On IV Ancef per ID. 08-11-2023 CT surgery and Cardiology consults have both seen patient. Thanks to them for their participation. No plans for RHC/LHC.  Plan on placing PICC line and sending pt home with IV ABX. Indication: MSSA MV IE Regimen: Cefazolin 2g IV every 8 hours End date: 09/17/23 (6 weeks from neg BCx 11/30) 08-12-2023 PICC place on 08-11-2023.  DC to home with 6 weeks of IV Ancef 2 g q8h. End date 09-17-2023.  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 Also has mobile mass (0.5 cm x 0.8 cm )in the right ventricle suspected either on the chordae or the wall of the ventricle. Will need CT surgery consult.  CT surgery has requested cards consult for RHC/LHC.  08-11-2023 CT surgery and Cardiology consults have both seen patient. Thanks to them for their participation. No plans for RHC/LHC.  Plan on placing PICC  line and sending pt home with IV ABX. Indication: MSSA MV IE Regimen: Cefazolin 2g IV every 8 hours End date: 09/17/23 (6 weeks from neg BCx 11/30)  08-12-2023 PICC place on 08-11-2023.  DC to home with 6 weeks of IV Ancef 2 g q8h. End date 09-17-2023.  Community acquired pneumonia 08-10-2023 remains on IV Ancef 08-11-2023 treated with IV ancef  History of pulmonary embolism 08-10-2023 not on systemic anticoagulation.  HLD (hyperlipidemia) 08-10-2023 continue with lipitor 20 mg qday  Coronary artery disease involving native coronary artery of native heart without angina pectoris 08-10-2023 H/o cardiac arrest 2002. Continue aspirin, statin.  (HFpEF) heart failure with preserved ejection fraction (HCC) Since admission until 08-09-2023 Bilateral pedal edema  Last ECHO with LVEF 55-60% with G1DD. Follows up with [facility] cardiology PTA meds- Toprol XL 200 mg daily, Cardizem 360 mg daily, torsemide 20mg  daily, entresto 97-103mg  BID, imdur  90mg  daily, farxiga 10mg  daily, hydralazine 100 mg twice daily All of those were continued.  With AKI today, pt's torsemide, Entresto and Farxiga were held.  Okay to continue Toprol, Cardizem and hydralazine.  08-11-2023. Continue with Toprol-XL 200 mg every day, cardizem-CD 360 mg every day, hydralazine 100 mg bid. Will change torsemide to prn at discharge. Continue to hold farxiga and entresto. Will get pt an followup appointment with cardiology so they can resume this medication after discharge.  08-12-2023 DC to home on Toprol-XL 200 mg every day, cardizem-CD 360 mg every day, hydralazine 100 mg bid.  demadex 20 mg every day prn +1 lbs weight in 1 day or +3 lbs in 1 week. Confirmed with Dr. Odis Hollingshead.  Will stop Entrestos and [place] on his DC meds. These will need to be restarted when he f/u with cardiology next week. Likely restart meds one at a time...not at the same time.  Prolonged QT interval Since admission until 08-09-2023 QT 560 / Qtc 592.  Likely due to  low electrolytes avoid QT prolonging medications as able Electrolytes replaced as above Repeat EKG 12/2 showed QTc improved to 446 ms 08-10-2023 stable.  OSA (obstructive sleep apnea) 08-10-2023 intolerant to CPAP   Stage 3b chronic kidney disease - baseline SCr 1.6-1.9 Since admission until 08-09-2023 Creatinine at baseline less than 1.5.  In the last 24 hours, creatinine elevated to 2.43 Start normal saline at 75 mL/h Hold torsemide, [place] Custard 08-10-2023 Scr 1.77 today.  Back at baseline 1.6-1.9. continue to hold diuretics for now. Stop IVF 08-11-2023 stable now. 08-12-2023 DC Scr 1.84  Essential hypertension Since admission until 08-09-2023 Bilateral pedal edema  Last ECHO with LVEF 55-60% with G1DD. Follows up with [facility] cardiology PTA meds- Toprol XL 200 mg daily, Cardizem 360 mg daily, torsemide 20mg  daily, entresto 97-103mg  BID, imdur 90mg  daily, farxiga 10mg  daily, hydralazine 100 mg twice daily All of those were continued.  With AKI today, pt's torsemide, Entresto and Farxiga were held.  Okay to continue Toprol, Cardizem and hydralazine. 08-10-2023 BP acceptable on Toprol-XL 200 mg every day, cardizem-CD 360 mg every day, hydralazine 100 mg bid. 08-11-2023 stable on Toprol-XL 200 mg every day, cardizem-CD 360 mg every day, hydralazine 100 mg bid. 08-12-2023 DC to home on Toprol-XL 200 mg every day, cardizem-CD 360 mg every day, hydralazine 100 mg bid.  demadex 20 mg every day prn +1 lbs weight in 1 day or +3 lbs in 1 week. Confirmed with Dr. Odis Hollingshead.  BPH (benign prostatic hyperplasia) 08-10-2023 continue with flomax 0.4 mg qday  Hypokalemia 08-10-2023 replete with po kcl. 08-12-2023 DC K 3.7       DVT prophylaxis: heparin injection 5,000 Units Start: 08/09/23 1000     Code Status: Full Code Family Communication: no family at bedside. Discussed with wife peggy yesterday about discharge plans Disposition Plan: return home Reason for continuing need for  hospitalization: medically stable for DC today. Pt will receive AM meds prior to DC.  Objective: Vitals:   08/11/23 2344 08/12/23 0404 08/12/23 0405 08/12/23 0843  BP: (!) 153/91 (!) 145/95  (!) 140/102  Pulse: (!) 59 (!) 54  62  Resp: 18 18  19   Temp: 97.6 F (36.4 C) 97.8 F (36.6 C)  97.8 F (36.6 C)  TempSrc: Axillary Oral  Oral  SpO2: 93% 96%  96%  Weight:   98.1 kg   Height:        Intake/Output Summary (Last 24 hours) at 08/12/2023 9518 Last data filed at  08/12/2023 0309 Gross per 24 hour  Intake 700 ml  Output 925 ml  Net -225 ml   Filed Weights   08/09/23 0602 08/10/23 0334 08/12/23 0405  Weight: 97.9 kg 97.8 kg 98.1 kg    Examination:  Physical Exam Vitals and nursing note reviewed.  Constitutional:      General: He is not in acute distress.    Appearance: He is not toxic-appearing.  HENT:     Head: Normocephalic and atraumatic.     Nose: Nose normal.  Eyes:     General: No scleral icterus. Cardiovascular:     Rate and Rhythm: Normal rate and regular rhythm.     Heart sounds: Murmur heard.  Pulmonary:     Effort: Pulmonary effort is normal.     Breath sounds: Normal breath sounds.  Abdominal:     General: Abdomen is flat. Bowel sounds are normal.     Palpations: Abdomen is soft.  Musculoskeletal:     Right lower leg: No edema.     Left lower leg: No edema.     Comments: Right UE PICC line  Skin:    General: Skin is warm and dry.     Capillary Refill: Capillary refill takes less than 2 seconds.  Neurological:     General: No focal deficit present.     Mental Status: He is alert and oriented to person, place, and time.     Data Reviewed: I have personally reviewed following labs and imaging studies  CBC: Recent Labs  Lab 08/05/23 1400 08/05/23 1406 08/05/23 1439 08/06/23 1218 08/07/23 0523 08/09/23 0541  WBC 11.4*  --   --  10.4 6.9 6.3  NEUTROABS 9.4*  --   --  8.2*  --  3.6  HGB 12.3* 10.9* 13.6 13.7 12.9* 14.3  HCT 36.6* 32.0*  40.0 40.5 38.7* 42.2  MCV 95.6  --   --  92.3 92.1 91.3  PLT 156  --   --  192 176 212   Basic Metabolic Panel: Recent Labs  Lab 08/05/23 1400 08/05/23 1406 08/06/23 1218 08/07/23 0523 08/07/23 0916 08/09/23 0541 08/10/23 1055 08/11/23 0644  NA 143   < > 140 140  --  137 139 138  K 2.7*   < > 3.9 3.4*  --  3.2* 3.2* 3.7  CL 117*   < > 109 108  --  101 107 104  CO2 17*  --  25 26  --  27 25 27   GLUCOSE 92   < > 112* 98  --  94 128* 98  BUN 10   < > 10 9  --  14 12 12   CREATININE 1.33*   < > 1.63* 1.63*  --  2.43* 1.77* 1.84*  CALCIUM 6.9*  --  9.1 8.9  --  9.2 8.7* 9.1  MG 1.6*  --   --   --  2.1  --   --   --   PHOS  --   --   --   --  2.6  --   --   --    < > = values in this interval not displayed.   GFR: Estimated Creatinine Clearance: 47.9 mL/min (A) (by C-G formula based on SCr of 1.84 mg/dL (H)). Liver Function Tests: Recent Labs  Lab 08/05/23 1400 08/06/23 1218  AST 14* 17  ALT 10 12  ALKPHOS 59 65  BILITOT 0.7 0.8  PROT 5.0* 6.6  ALBUMIN 2.7* 3.4*   Coagulation Profile:  Recent Labs  Lab 08/05/23 1400 08/06/23 1218  INR 1.2 1.2   BNP (last 3 results) Recent Labs    05/17/23 0801 08/05/23 1400  BNP 507.0* 1,648.2*   CBG: Recent Labs  Lab 08/08/23 0729 08/09/23 0618 08/10/23 0601 08/11/23 0519 08/12/23 0650  GLUCAP 129* 103* 107* 105* 95   Lipid Profile: Recent Labs    08/11/23 0644  CHOL 115  HDL 35*  LDLCALC 58  TRIG 409  CHOLHDL 3.3   Sepsis Labs: Recent Labs  Lab 08/05/23 1406 08/05/23 1601 08/06/23 1226 08/06/23 1342  LATICACIDVEN 0.7 <0.3* 0.6 0.6    Recent Results (from the past 240 hour(s))  SARS Coronavirus 2 by RT PCR (hospital order, performed in [facility] hospital lab) *cepheid single result test*     Status: None   Collection Time: 08/05/23  1:58 PM   Specimen: Nasal Swab  Result Value Ref Range Status   SARS Coronavirus 2 by RT PCR NEGATIVE NEGATIVE Final    Comment: Performed at [facility] Lab,  [address] [place], [place] [address]  Culture, blood (routine x 2)     Status: Abnormal   Collection Time: 08/05/23  2:00 PM   Specimen: BLOOD  Result Value Ref Range Status   Specimen Description BLOOD BLOOD LEFT ARM  Final   Special Requests   Final    BOTTLES DRAWN AEROBIC AND ANAEROBIC Blood Culture results may not be optimal due to an inadequate volume of blood received in culture bottles   Culture  Setup Time   Final    GRAM POSITIVE COCCI IN BOTH AEROBIC AND ANAEROBIC BOTTLES CRITICAL RESULT CALLED TO, READ BACK BY AND VERIFIED WITH: OLDLAND RN 08/06/2023 @ 0703 BY AB Performed at [facility] Lab, [address], [place], [place] [address]    Culture STAPHYLOCOCCUS AUREUS STREPTOCOCCUS SPECIES  (A)  Final   Report Status 08/10/2023 FINAL  Final   Organism ID, Bacteria STAPHYLOCOCCUS AUREUS  Final   Organism ID, Bacteria STREPTOCOCCUS SPECIES  Final      Susceptibility   Staphylococcus aureus - MIC*    CIPROFLOXACIN 1 SENSITIVE Sensitive     ERYTHROMYCIN >=8 RESISTANT Resistant     GENTAMICIN <=0.5 SENSITIVE Sensitive     OXACILLIN 0.5 SENSITIVE Sensitive     TETRACYCLINE <=1 SENSITIVE Sensitive     VANCOMYCIN 1 SENSITIVE Sensitive     TRIMETH/SULFA <=10 SENSITIVE Sensitive     CLINDAMYCIN RESISTANT Resistant     RIFAMPIN <=0.5 SENSITIVE Sensitive     Inducible Clindamycin POSITIVE Resistant     LINEZOLID 2 SENSITIVE Sensitive     * STAPHYLOCOCCUS AUREUS   Streptococcus species - MIC*    PENICILLIN 0.5 INTERMEDIATE Intermediate     CEFTRIAXONE 0.25 SENSITIVE Sensitive     ERYTHROMYCIN <=0.12 SENSITIVE Sensitive     LEVOFLOXACIN 1 SENSITIVE Sensitive     VANCOMYCIN 1 SENSITIVE Sensitive     * STREPTOCOCCUS SPECIES  Blood Culture ID Panel (Reflexed)     Status: Abnormal   Collection Time: 08/05/23  2:00 PM  Result Value Ref Range Status   Enterococcus faecalis NOT DETECTED NOT DETECTED Final   Enterococcus Faecium NOT DETECTED NOT DETECTED Final   Listeria  monocytogenes NOT DETECTED NOT DETECTED Final   Staphylococcus species DETECTED (A) NOT DETECTED Final    Comment: CRITICAL RESULT CALLED TO, READ BACK BY AND VERIFIED WITH: OLDLAND RN 08/06/2023 @ 0703 BY AB    Staphylococcus aureus (BCID) DETECTED (A) NOT DETECTED Final  Comment: CRITICAL RESULT CALLED TO, READ BACK BY AND VERIFIED WITH: OLDLAND RN 08/06/2023 @ 0703 BY AB    Staphylococcus epidermidis NOT DETECTED NOT DETECTED Final   Staphylococcus lugdunensis NOT DETECTED NOT DETECTED Final   Streptococcus species NOT DETECTED NOT DETECTED Final   Streptococcus agalactiae NOT DETECTED NOT DETECTED Final   Streptococcus pneumoniae NOT DETECTED NOT DETECTED Final   Streptococcus pyogenes NOT DETECTED NOT DETECTED Final   A.calcoaceticus-baumannii NOT DETECTED NOT DETECTED Final   Bacteroides fragilis NOT DETECTED NOT DETECTED Final   Enterobacterales NOT DETECTED NOT DETECTED Final   Enterobacter cloacae complex NOT DETECTED NOT DETECTED Final   Escherichia coli NOT DETECTED NOT DETECTED Final   Klebsiella aerogenes NOT DETECTED NOT DETECTED Final   Klebsiella oxytoca NOT DETECTED NOT DETECTED Final   Klebsiella pneumoniae NOT DETECTED NOT DETECTED Final   Proteus species NOT DETECTED NOT DETECTED Final   Salmonella species NOT DETECTED NOT DETECTED Final   Serratia marcescens NOT DETECTED NOT DETECTED Final   Haemophilus influenzae NOT DETECTED NOT DETECTED Final   Neisseria meningitidis NOT DETECTED NOT DETECTED Final   Pseudomonas aeruginosa NOT DETECTED NOT DETECTED Final   Stenotrophomonas maltophilia NOT DETECTED NOT DETECTED Final   Candida albicans NOT DETECTED NOT DETECTED Final   Candida auris NOT DETECTED NOT DETECTED Final   Candida glabrata NOT DETECTED NOT DETECTED Final   Candida krusei NOT DETECTED NOT DETECTED Final   Candida parapsilosis NOT DETECTED NOT DETECTED Final   Candida tropicalis NOT DETECTED NOT DETECTED Final   Cryptococcus neoformans/gattii NOT  DETECTED NOT DETECTED Final   Meth resistant mecA/C and MREJ NOT DETECTED NOT DETECTED Final    Comment: Performed at [facility], [address], [place], [place] [address]  Culture, blood (routine x 2)     Status: None   Collection Time: 08/05/23  2:12 PM   Specimen: BLOOD  Result Value Ref Range Status   Specimen Description BLOOD BLOOD RIGHT ARM  Final   Special Requests   Final    BOTTLES DRAWN AEROBIC AND ANAEROBIC Blood Culture results may not be optimal due to an inadequate volume of blood received in culture bottles   Culture   Final    NO GROWTH 5 DAYS Performed at [facility], [address] [place], [place] [address]    Report Status 08/10/2023 FINAL  Final  Blood Culture (routine x 2)     Status: None   Collection Time: 08/06/23 11:38 AM   Specimen: BLOOD LEFT HAND  Result Value Ref Range Status   Specimen Description BLOOD LEFT HAND  Final   Special Requests   Final    BOTTLES DRAWN AEROBIC AND ANAEROBIC Blood Culture results may not be optimal due to an inadequate volume of blood received in culture bottles   Culture   Final    NO GROWTH 5 DAYS Performed at [facility], [address], [place], [place] [address]    Report Status 08/11/2023 FINAL  Final  Blood Culture (routine x 2)     Status: None   Collection Time: 08/06/23 11:43 AM   Specimen: BLOOD  Result Value Ref Range Status   Specimen Description BLOOD RIGHT ANTECUBITAL  Final   Special Requests   Final    BOTTLES DRAWN AEROBIC AND ANAEROBIC Blood Culture adequate volume   Culture   Final    NO GROWTH 5 DAYS Performed at [facility], [address], [place], [place] [address]  Report Status 08/11/2023 FINAL  Final  Respiratory (~20 pathogens) panel by PCR     Status: None   Collection Time: 08/07/23  5:50 AM   Specimen: Nasopharyngeal Swab; Respiratory  Result Value Ref Range Status   Adenovirus NOT DETECTED NOT DETECTED Final   Coronavirus 229E NOT DETECTED NOT  DETECTED Final    Comment: (NOTE) The Coronavirus on the Respiratory Panel, DOES NOT test for the novel  Coronavirus (2019 nCoV)    Coronavirus HKU1 NOT DETECTED NOT DETECTED Final   Coronavirus NL63 NOT DETECTED NOT DETECTED Final   Coronavirus OC43 NOT DETECTED NOT DETECTED Final   Metapneumovirus NOT DETECTED NOT DETECTED Final   Rhinovirus / Enterovirus NOT DETECTED NOT DETECTED Final   Influenza A NOT DETECTED NOT DETECTED Final   Influenza B NOT DETECTED NOT DETECTED Final   Parainfluenza Virus 1 NOT DETECTED NOT DETECTED Final   Parainfluenza Virus 2 NOT DETECTED NOT DETECTED Final   Parainfluenza Virus 3 NOT DETECTED NOT DETECTED Final   Parainfluenza Virus 4 NOT DETECTED NOT DETECTED Final   Respiratory Syncytial Virus NOT DETECTED NOT DETECTED Final   Bordetella pertussis NOT DETECTED NOT DETECTED Final   Bordetella Parapertussis NOT DETECTED NOT DETECTED Final   Chlamydophila pneumoniae NOT DETECTED NOT DETECTED Final   Mycoplasma pneumoniae NOT DETECTED NOT DETECTED Final    Comment: Performed at [facility], [address], [place], [place] [address]  Culture, Respiratory w Gram Stain     Status: None   Collection Time: 08/07/23  7:36 AM   Specimen: SPU; Respiratory  Result Value Ref Range Status   Specimen Description SPUTUM  Final   Special Requests NONE  Final   Gram Stain   Final    MODERATE WBC PRESENT, PREDOMINANTLY PMN FEW GRAM POSITIVE COCCI RARE GRAM POSITIVE RODS    Culture   Final    MODERATE Normal respiratory flora-no Staph aureus or Pseudomonas seen Performed at [facility], [address] [place], [place] [address]    Report Status 08/09/2023 FINAL  Final     Radiology Studies: [place] EKG SITE RITE  Result Date: 08/11/2023 If Site Rite image not attached, placement could not be confirmed due to current cardiac rhythm.  [place] EKG SITE RITE  Result Date: 08/10/2023 If Site Rite image not attached, placement could not be confirmed due to  current cardiac rhythm.   Scheduled Meds:  aspirin EC  81 mg Oral Daily   atorvastatin  20 mg Oral Daily   Chlorhexidine Gluconate Cloth  6 each Topical Daily   dextromethorphan-guaiFENesin  1 tablet Oral BID   diltiazem  360 mg Oral Daily   heparin injection (subcutaneous)  5,000 Units Subcutaneous Q12H   hydrALAZINE  100 mg Oral BID   isosorbide mononitrate  90 mg Oral Daily   metoprolol  200 mg Oral Daily   sodium chloride flush  10 mL Intravenous Q12H   tamsulosin  0.4 mg Oral QPC breakfast   Continuous Infusions:   ceFAZolin (ANCEF) IV 2 g (08/12/23 0549)     LOS: 6 days   Time spent: 40 minutes  Carollee Herter, DO  [facility]  08/12/2023, 9:22 AM

## 2023-08-12 NOTE — Care Management Important Message (Signed)
Important Message  Patient Details  Name: [name] MRN: [id number] Date of Birth: November 21, 1955   Important Message Given:  Yes - Medicare IM     Dorena Bodo 08/12/2023, 3:12 PM

## 2023-08-12 NOTE — OR Nursing (Signed)
Pt's blood sugar this AM is 161. Pt has insulin pump. Pt states no insulin is needed at this time

## 2023-08-12 NOTE — Progress Notes (Signed)
Patient Name: [name] Date of Encounter: 08/12/2023  [facility] Cardiologist: Tessa Lerner, DO  Interval Summary  .    Resting in bed comfortably. Being discharged later today.  Vital Signs .    Vitals:   08/11/23 2344 08/12/23 0404 08/12/23 0405 08/12/23 0843  BP: (!) 153/91 (!) 145/95  (!) 140/102  Pulse: (!) 59 (!) 54  62  Resp: 18 18  19   Temp: 97.6 F (36.4 C) 97.8 F (36.6 C)  97.8 F (36.6 C)  TempSrc: Axillary Oral  Oral  SpO2: 93% 96%  96%  Weight:   98.1 kg   Height:        Intake/Output Summary (Last 24 hours) at 08/12/2023 0914 Last data filed at 08/12/2023 0309 Gross per 24 hour  Intake 700 ml  Output 925 ml  Net -225 ml      08/12/2023    4:05 AM 08/10/2023    3:34 AM 08/09/2023    6:02 AM  Last 3 Weights  Weight (lbs) 216 lb 4.3 oz 215 lb 9.8 oz 215 lb 13.3 oz  Weight (kg) 98.1 kg 97.8 kg 97.9 kg      Telemetry/ECG    Currently not on telemetry- Personally Reviewed  Physical Exam .   GEN: No acute distress.   Neck: No JVD Cardiac: RRR, no murmurs, rubs, or gallops.  Respiratory: Clear to auscultation bilaterally. GI: Soft, nontender, non-distended  MS: No edema, right upper extremity PICC line present  Assessment & Plan .     Impression: MSSA bacteremia: Community-acquired pneumonia. Abnormal transesophageal echocardiogram: Patient was initially diagnosed with community-acquired pneumonia, blood cultures were performed, started on antibiotics, and discharged home from the ED.  Later called back as 1 out of 2 bottles were positive for MSSA bacteremia as of 08/05/2023. Blood cultures from 08/06/2023 are final and no growth to date. This is a second episode of MSSA bacteremia. In the clinical setting TEE was performed to rule out endocarditis.   Please refer to the report for additional details. I personally reviewed the TEE and he does have a circular nodularity likely calcified nodule or prior healed vegetation on the  chordae of the tricuspid valve as well as thickening of the mitral valve leaflet.  No evidence of valve dehiscence or severe valvular heart disease.  Due to concerns for possible infection, his second bout of MSSA bacteremia, shared decision between primary team, infectious disease, and cardiology was to treat him for presumed endocarditis given the clinical setting. CT surgery was also consulted by primary team who recommended no surgical intervention at this time. Patient will be on IV antibiotics for 6 weeks. I will arrange follow-up in the office once his antibiotics is complete to discuss a repeat transesophageal echocardiogram to verify that he has no findings to suggest endocarditis and to reevaluate valvular heart disease.  Patient is agreeable with the plan of care.  Chronic HFpEF: Acute kidney injury on chronic kidney disease: Primary team reached out with medication reconciliation given his history of HFpEF. Will hold off on Farxiga and Entresto at this time given his AKI. Continue torsemide on as needed basis for weight gain more than 1 pound over 24 hours or 3 pounds over a week. Once serum creatinine is closer to 1.5-1.6 mg/dL recommend restarting Farxiga. Patient is advised to reach out if he has any questions or concerns postdischarge  Patient is stable to be discharged from a cardiovascular standpoint.  Will arrange outpatient follow-up.   For questions  or updates, please contact [facility] Please consult [email or web address] for contact info under      Signed, Shelbe Haglund, DO

## 2023-08-12 NOTE — Progress Notes (Signed)
Pt does not qualify for CR services, will s/o.

## 2023-08-12 NOTE — Plan of Care (Signed)
  Problem: Education: Goal: Knowledge of General Education information will improve Description: Including pain rating scale, medication(s)/side effects and non-pharmacologic comfort measures Outcome: Progressing   Problem: Activity: Goal: Risk for activity intolerance will decrease Outcome: Progressing   Problem: Nutrition: Goal: Adequate nutrition will be maintained Outcome: Progressing   

## 2023-08-12 NOTE — Discharge Summary (Signed)
[facility] Physician Discharge Summary   Patient name: [name]  Admit date:     08/06/2023  Discharge date: 08/12/2023  Attending Physician: Briscoe Burns [[id number]]  Discharge Physician: Carollee Herter   PCP: Creola Corn, MD  Admitted From: Home  Disposition:  Home  Recommendations for Outpatient Follow-up:  Follow up with PCP in 1-2 weeks Follow up with cardiology in 1-2 weeks following discharge Please follow up on the following pending results: ID clinic will f/u on weekly CBC, CMP while on IV ABX  Home Health:Yes. For IV ABX Equipment/Devices: PICC LINE : Date Placed 08-11-2023  Discharge Condition:Stable CODE STATUS:FULL Diet recommendation: Heart Healthy Fluid Restriction: None  Hospital Summary: HPI: KIRT LEYVAS is a 67 y.o. male with medical history significant of chronic HFpEF, hypertension, CAD, OSA intolerant to CPAP, hyperlipidemia, history of cardiac arrest (2002), history of PE (completed Xarelto therapy), history of CVA, sarcoidosis, stage III CKD, prior history of MSSA bacteremia (11/2020) presenting to the ED after having positive blood cultures for MSSA.   Patient reports that he was experiencing headaches, cough, congestion, chills starting this past Friday morning.  He also experienced some chest pain.  Wife checked his blood pressure and found that it was elevated and thus subsequently called EMS to bring him to the hospital yesterday.  EMS gave him aspirin and placed him on oxygen which relieved his chest pain.  Workup in the ED did not reveal any ACS.  CT angio chest abdomen pelvis did not reveal any PE but did show likely right middle lobe and right lower lobe pneumonia.  He was subsequently sent home with oral doxycycline.  Blood cultures drawn yesterday returned positive for MSSA today and patient received a call from ID advising him to return to the ED.   Today, he notes that his previous symptoms are somewhat better although they are still  present.  He has no longer having a headache or chest pain.  He does continue to have cough productive of yellow/brown sputum although amount of sputum has improved since yesterday.  He does continue to endorse some congestion and chills.  Denies any fevers, shortness of breath, palpitations, chest pain, abdominal pain, urinary changes, weakness.  He denies any recent sick contacts although he was recently together with family for Thanksgiving.   ED course: CBC and CMP fairly unremarkable, kidney function is around his baseline of CKD 3 (baseline EGFR 45-50).  Lactic acid negative x 2.  Chest x-ray revealed stable bronchopneumonia at lung bases.  Blood cultures redrawn today x 2, pending.  [facility] asked to evaluate patient for admission.  Significant Events: Admitted 08/06/2023 for MSSA bacteremia, CAP   Significant Labs: 08-05-2023 blood cx growing MSSA Admission WBC 10.4, BUN 10, Scr 1.63  Significant Imaging Studies: CTA chest/abd/pelvis showed NO dissection. Showed RML/RLL/LUL/lingula ground glass opacities. 3 vessel CAD 08-06-2023 MRI T/L spine no acute infection 08-09-2023 TEE showed mobile mass in RV. Either on chordae or RV wall. Also fibrinous mobile mass of mitral valve  Antibiotic Therapy: Anti-infectives (From admission, onward)    Start     Dose/Rate Route Frequency Ordered Stop   08/07/23 1400  cefTRIAXone (ROCEPHIN) 1 g in sodium chloride 0.9 % 100 mL IVPB  Status:  Discontinued        1 g 200 mL/hr over 30 Minutes Intravenous Every 24 hours 08/06/23 1534 08/06/23 1741   08/07/23 1400  ceFAZolin (ANCEF) IVPB 2g/100 mL premix        2  g 200 mL/hr over 30 Minutes Intravenous Every 8 hours 08/06/23 1741     08/06/23 2200  doxycycline (VIBRA-TABS) tablet 100 mg  Status:  Discontinued        100 mg Oral Every 12 hours 08/06/23 1534 08/07/23 1842   08/06/23 1315  cefTRIAXone (ROCEPHIN) 1 g in sodium chloride 0.9 % 100 mL IVPB  Status:  Discontinued        1 g 200  mL/hr over 30 Minutes Intravenous  Once 08/06/23 1311 08/06/23 1312   08/06/23 1315  cefTRIAXone (ROCEPHIN) 2 g in sodium chloride 0.9 % 100 mL IVPB        2 g 200 mL/hr over 30 Minutes Intravenous  Once 08/06/23 1312 08/06/23 1411   08/06/23 1145  cefTRIAXone (ROCEPHIN) 2 g in sodium chloride 0.9 % 100 mL IVPB  Status:  Discontinued        2 g 200 mL/hr over 30 Minutes Intravenous Every 24 hours 08/06/23 1141 08/06/23 1143   08/06/23 1145  doxycycline (VIBRA-TABS) tablet 100 mg        100 mg Oral  Once 08/06/23 1143 08/06/23 1214       Procedures: 08-09-2023 TEE showed mobile mass in RV. Either on chordae or RV wall. Also fibrinous mobile mass of mitral valve  Consultants: ID Cardiology Cardiothoracic surgery   Hospital Course by Problem: * Bacteremia due to Staphylococcus aureus Since admission until 08-09-2023 Blood culture sent this admission it is also showing gram-positive cocci Currently on cefazolin a per ID recommendation. Surface echo did not show any evidence of vegetation.  Underwent TEE today.  Pending report MRI thoracic and lumbar did not show any evidence of active infection. Currently on Mucinex twice daily for cough Currently not on supplemental oxygen.  Monitor 08-10-2023 blood cx grew MSSA. On IV Ancef per ID. 08-11-2023 CT surgery and Cardiology consults have both seen patient. Thanks to them for their participation. No plans for RHC/LHC.  Plan on placing PICC line and sending pt home with IV ABX. Indication: MSSA MV IE Regimen: Cefazolin 2g IV every 8 hours End date: 09/17/23 (6 weeks from neg BCx 11/30) 08-12-2023 PICC place on 08-11-2023.  DC to home with 6 weeks of IV Ancef 2 g q8h. End date 09-17-2023.  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 Also has mobile mass (0.5 cm x 0.8 cm )in the right ventricle suspected either on the chordae or the wall of the ventricle. Will need CT surgery consult.  CT surgery has  requested cards consult for RHC/LHC.  08-11-2023 CT surgery and Cardiology consults have both seen patient. Thanks to them for their participation. No plans for RHC/LHC.  Plan on placing PICC line and sending pt home with IV ABX. Indication: MSSA MV IE Regimen: Cefazolin 2g IV every 8 hours End date: 09/17/23 (6 weeks from neg BCx 11/30)  08-12-2023 PICC place on 08-11-2023.  DC to home with 6 weeks of IV Ancef 2 g q8h. End date 09-17-2023.  Community acquired pneumonia 08-10-2023 remains on IV Ancef 08-11-2023 treated with IV ancef  History of pulmonary embolism 08-10-2023 not on systemic anticoagulation.  HLD (hyperlipidemia) 08-10-2023 continue with lipitor 20 mg qday  Coronary artery disease involving native coronary artery of native heart without angina pectoris 08-10-2023 H/o cardiac arrest 2002. Continue aspirin, statin.  (HFpEF) heart failure with preserved ejection fraction (HCC) Since admission until 08-09-2023 Bilateral pedal edema  Last ECHO with LVEF 55-60% with G1DD. Follows up with  [facility] cardiology PTA meds- Toprol XL 200 mg daily, Cardizem 360 mg daily, torsemide 20mg  daily, entresto 97-103mg  BID, imdur 90mg  daily, farxiga 10mg  daily, hydralazine 100 mg twice daily All of those were continued.  With AKI today, pt's torsemide, Entresto and Farxiga were held.  Okay to continue Toprol, Cardizem and hydralazine.  08-11-2023. Continue with Toprol-XL 200 mg every day, cardizem-CD 360 mg every day, hydralazine 100 mg bid. Will change torsemide to prn at discharge. Continue to hold farxiga and entresto. Will get pt an followup appointment with cardiology so they can resume this medication after discharge.  08-12-2023 DC to home on Toprol-XL 200 mg every day, cardizem-CD 360 mg every day, hydralazine 100 mg bid.  demadex 20 mg every day prn +1 lbs weight in 1 day or +3 lbs in 1 week. Confirmed with Dr. Odis Hollingshead.  Will stop Entrestos and [place] on his DC meds. These will need to be  restarted when he f/u with cardiology next week. Likely restart meds one at a time...not at the same time.  Prolonged QT interval Since admission until 08-09-2023 QT 560 / Qtc 592.  Likely due to low electrolytes avoid QT prolonging medications as able Electrolytes replaced as above Repeat EKG 12/2 showed QTc improved to 446 ms 08-10-2023 stable.  OSA (obstructive sleep apnea) 08-10-2023 intolerant to CPAP   Stage 3b chronic kidney disease - baseline SCr 1.6-1.9 Since admission until 08-09-2023 Creatinine at baseline less than 1.5.  In the last 24 hours, creatinine elevated to 2.43 Start normal saline at 75 mL/h Hold torsemide, [place]if[place] Custard 08-10-2023 Scr 1.77 today.  Back at baseline 1.6-1.9. continue to hold diuretics for now. Stop IVF 08-11-2023 stable now. 08-12-2023 DC Scr 1.84  Essential hypertension Since admission until 08-09-2023 Bilateral pedal edema  Last ECHO with LVEF 55-60% with G1DD. Follows up with [facility] cardiology PTA meds- Toprol XL 200 mg daily, Cardizem 360 mg daily, torsemide 20mg  daily, entresto 97-103mg  BID, imdur 90mg  daily, farxiga 10mg  daily, hydralazine 100 mg twice daily All of those were continued.  With AKI today, pt's torsemide, Entresto and Farxiga were held.  Okay to continue Toprol, Cardizem and hydralazine. 08-10-2023 BP acceptable on Toprol-XL 200 mg every day, cardizem-CD 360 mg every day, hydralazine 100 mg bid. 08-11-2023 stable on Toprol-XL 200 mg every day, cardizem-CD 360 mg every day, hydralazine 100 mg bid. 08-12-2023 DC to home on Toprol-XL 200 mg every day, cardizem-CD 360 mg every day, hydralazine 100 mg bid.  demadex 20 mg every day prn +1 lbs weight in 1 day or +3 lbs in 1 week. Confirmed with Dr. Odis Hollingshead.  BPH (benign prostatic hyperplasia) 08-10-2023 continue with flomax 0.4 mg qday  Hypokalemia 08-10-2023 replete with po kcl. 08-12-2023 DC K 3.7    Discharge Diagnoses:  Principal Problem:   Bacteremia due to Staphylococcus  aureus Active Problems:   Endocarditis of mitral valve   Community acquired pneumonia   Essential hypertension   Stage 3b chronic kidney disease - baseline SCr 1.6-1.9   OSA (obstructive sleep apnea)   Prolonged QT interval   (HFpEF) heart failure with preserved ejection fraction (HCC)   Coronary artery disease involving native coronary artery of native heart without angina pectoris   HLD (hyperlipidemia)   History of pulmonary embolism   Hypokalemia   BPH (benign prostatic hyperplasia)   Abnormal echocardiogram   Discharge Instructions  Discharge Instructions     (HEART FAILURE PATIENTS) Call MD:  Anytime you have any of the following symptoms: 1) 3 pound weight  gain in 24 hours or 5 pounds in 1 week 2) shortness of breath, with or without a dry hacking cough 3) swelling in the hands, feet or stomach 4) if you have to sleep on extra pillows at night in order to breathe.   Complete by: As directed    [facility] pharmacist to adjust dose for Vancomycin, Aminoglycosides and other anti-infective therapies as requested by physician.   Complete by: As directed    [facility] to provide Cath Flo 2mg    Complete by: As directed    Administer for PICC line occlusion and as ordered by physician for other access device issues.   Anaphylaxis Kit: Provided to treat any anaphylactic reaction to the medication being provided to the patient if First Dose or when requested by physician   Complete by: As directed    Epinephrine 1mg /ml vial / amp: Administer 0.3mg  (0.89ml) subcutaneously once for moderate to severe anaphylaxis, nurse to call physician and pharmacy when reaction occurs and call 911 if needed for immediate care   Diphenhydramine 50mg /ml IV vial: Administer 25-50mg  IV/IM PRN for first dose reaction, rash, itching, mild reaction, nurse to call physician and pharmacy when reaction occurs   Sodium Chloride 0.9% NS IV: Administer if needed for hypovolemic blood  pressure drop or as ordered by physician after call to physician with anaphylactic reaction   Call MD for:  difficulty breathing, headache or visual disturbances   Complete by: As directed    Call MD for:  extreme fatigue   Complete by: As directed    Call MD for:  persistant dizziness or light-headedness   Complete by: As directed    Call MD for:  persistant nausea and vomiting   Complete by: As directed    Call MD for:  severe uncontrolled pain   Complete by: As directed    Call MD for:  temperature >100.4   Complete by: As directed    Change dressing on IV access line weekly and PRN   Complete by: As directed    Diet - low sodium heart healthy   Complete by: As directed    Discharge instructions   Complete by: As directed    1. Follow up with your primary care provider in 1-2 weeks following hospital discharge 2. Dr. Emelda Brothers office(cardiology) will call you with an appointment for 1-2 weeks following discharge from hospital. 3. Do not take Entresto or Farxiga until you are told to restart either of these medications by the cardiologist.   Flush IV access with Sodium Chloride 0.9% and Heparin 10 units/ml or 100 units/ml   Complete by: As directed    Home infusion instructions - [facility]   Complete by: As directed    Instructions: Flush IV access with Sodium Chloride 0.9% and Heparin 10units/ml or 100units/ml   Change dressing on IV access line: Weekly and PRN   Instructions Cath Flo 2mg : Administer for PICC Line occlusion and as ordered by physician for other access device   [facility] pharmacist to adjust dose for: Vancomycin, Aminoglycosides and other anti-infective therapies as requested by physician   Increase activity slowly   Complete by: As directed    Method of administration may be changed at the discretion of home infusion pharmacist based upon assessment of the patient and/or caregiver's ability to self-administer the medication ordered    Complete by: As directed    No wound care   Complete by: As directed  Allergies as of 08/12/2023       Reactions   Codeine Nausea And Vomiting   Hydrochlorothiazide Other (See Comments)   Unknown per Pt.    Hydrocodone-acetaminophen Nausea And Vomiting   Hydromorphone Other (See Comments)   hallucinations         Medication List     STOP taking these medications    doxycycline 100 MG capsule Commonly known as: VIBRAMYCIN   Entresto 97-103 MG Generic drug: sacubitril-valsartan   Farxiga 10 MG Tabs tablet Generic drug: dapagliflozin propanediol       TAKE these medications    aspirin EC 81 MG tablet Take 81 mg by mouth daily. Patients Wife gave him 4 tablets this am. 08/06/2023.   atorvastatin 20 MG tablet Commonly known as: LIPITOR Take 1 tablet (20 mg total) by mouth daily.   ceFAZolin IVPB Commonly known as: ANCEF Inject 2 g into the vein every 8 (eight) hours. Indication:  MSSA IE First Dose: Yes Last Day of Therapy:  09/17/23 Labs - Once weekly:  CBC/D and BMP, Labs - Once weekly: ESR and CRP Method of administration: IV Push Method of administration may be changed at the discretion of home infusion pharmacist based upon assessment of the patient and/or caregiver's ability to self-administer the medication ordered.   diltiazem 360 MG 24 hr capsule Commonly known as: CARDIZEM CD Take 1 capsule (360 mg total) by mouth daily.   hydrALAZINE 100 MG tablet Commonly known as: APRESOLINE Take 1 tablet (100 mg total) by mouth 2 (two) times daily.   ipratropium-albuterol 0.5-2.5 (3) MG/3ML Soln Commonly known as: DUONEB Take 3 mLs by nebulization every 6 (six) hours as needed for up to 20 days (SOB or wheezing). What changed:  when to take this reasons to take this   isosorbide mononitrate 30 MG 24 hr tablet Commonly known as: IMDUR Take 3 tablets (90 mg total) by mouth daily.   metoprolol 200 MG 24 hr tablet Commonly known as: TOPROL-XL Take 1  tablet (200 mg total) by mouth daily.   potassium chloride SA 20 MEQ tablet Commonly known as: KLOR-CON M Take 1 tablet (20 mEq total) by mouth daily as needed (take only if Demadex is taken). What changed:  when to take this reasons to take this   tamsulosin 0.4 MG Caps capsule Commonly known as: Flomax Take 1 capsule (0.4 mg total) by mouth daily. What changed:  how much to take when to take this   torsemide 20 MG tablet Commonly known as: DEMADEX Take 1 tablet (20 mg total) by mouth daily as needed (1 lb or more weight gain in 1 day or 3 lbs or more weight gain in 1 week). What changed:  when to take this reasons to take this   traZODone 150 MG tablet Commonly known as: DESYREL Take 1 tablet (150 mg total) by mouth at bedtime as needed for up to 90 doses for sleep. What changed:  when to take this reasons to take this   triamcinolone cream 0.1 % Commonly known as: KENALOG Apply to affected areas twice a day as needed for itching/inflammation.   VITAMIN D (CHOLECALCIFEROL) PO Take 1 capsule by mouth daily.               Discharge Care Instructions  (From admission, onward)           Start     Ordered   08/10/23 0000  Change dressing on IV access line weekly and PRN  (Home infusion  instructions - [facility] )        08/10/23 1131            Follow-up Information     [facility] Follow up.   Why: [facility] home antibiotics company wil provide coordination of home antibiotics.        [place], [facility] Follow up.   Why: [facility] will provide home health services. Contact information: [address] [place] [address] [phone]         Gaston Islam., NP Follow up.   Specialty: Cardiology Why: Monday Sep 19, 2023 Arrive by 2:05 PMAppt at 2:20 PM (25 min) Contact information: [address] [place] [address] [phone]                Allergies   Allergen Reactions   Codeine Nausea And Vomiting   Hydrochlorothiazide Other (See Comments)    Unknown per Pt.    Hydrocodone-Acetaminophen Nausea And Vomiting   Hydromorphone Other (See Comments)    hallucinations     Discharge Exam: Vitals:   08/12/23 0404 08/12/23 0843  BP: (!) 145/95 (!) 140/102  Pulse: (!) 54 62  Resp: 18 19  Temp: 97.8 F (36.6 C) 97.8 F (36.6 C)  SpO2: 96% 96%   Weight Information (since admission)     Date/Time Weight Weight in lbs BSA (Calculated - sq m) BMI (Calculated) Who   08/12/23 0405 98.1 kg 216.27 lbs -- 29.33 TW   08/10/23 0334 97.8 kg 215.61 lbs -- 29.24 HB   08/09/23 0602 97.9 kg 215.83 lbs -- 29.27 HB   08/08/23 0414 99.2 kg 218.7 lbs -- 29.65 TN   08/07/23 0500 97.3 kg 214.51 lbs -- 29.09 NT   08/06/23 19:08:31 100.2 kg 220.9 lbs -- 29.95 JC   08/06/23 1055 74.8 kg 165 lbs 1.95 sq meters 22.37 MR      Physical Exam Vitals and nursing note reviewed.  Constitutional:      General: He is not in acute distress.    Appearance: He is not toxic-appearing.  HENT:     Head: Normocephalic and atraumatic.     Nose: Nose normal.  Eyes:     General: No scleral icterus. Cardiovascular:     Rate and Rhythm: Normal rate and regular rhythm.     Heart sounds: Murmur heard.  Pulmonary:     Effort: Pulmonary effort is normal.     Breath sounds: Normal breath sounds.  Abdominal:     General: Abdomen is flat. Bowel sounds are normal.     Palpations: Abdomen is soft.  Musculoskeletal:     Right lower leg: No edema.     Left lower leg: No edema.     Comments: Right UE PICC line  Skin:    General: Skin is warm and dry.     Capillary Refill: Capillary refill takes less than 2 seconds.  Neurological:     General: No focal deficit present.     Mental Status: He is alert and oriented to person, place, and time.     The results of significant diagnostics from this hospitalization (including imaging, microbiology, ancillary and laboratory)  are listed below for reference.    Microbiology: Recent Results (from the past 240 hour(s))  SARS Coronavirus 2 by RT PCR (hospital order, performed in [facility] hospital lab) *cepheid single result test*     Status: None   Collection Time: 08/05/23  1:58 PM  Specimen: Nasal Swab  Result Value Ref Range Status   SARS Coronavirus 2 by RT PCR NEGATIVE NEGATIVE Final    Comment: Performed at [facility], [address] [place], [place] [address]  Culture, blood (routine x 2)     Status: Abnormal   Collection Time: 08/05/23  2:00 PM   Specimen: BLOOD  Result Value Ref Range Status   Specimen Description BLOOD BLOOD LEFT ARM  Final   Special Requests   Final    BOTTLES DRAWN AEROBIC AND ANAEROBIC Blood Culture results may not be optimal due to an inadequate volume of blood received in culture bottles   Culture  Setup Time   Final    GRAM POSITIVE COCCI IN BOTH AEROBIC AND ANAEROBIC BOTTLES CRITICAL RESULT CALLED TO, READ BACK BY AND VERIFIED WITH: OLDLAND RN 08/06/2023 @ 0703 BY AB Performed at [facility], [address], [place], [place] [address]    Culture STAPHYLOCOCCUS AUREUS STREPTOCOCCUS SPECIES  (A)  Final   Report Status 08/10/2023 FINAL  Final   Organism ID, Bacteria STAPHYLOCOCCUS AUREUS  Final   Organism ID, Bacteria STREPTOCOCCUS SPECIES  Final      Susceptibility   Staphylococcus aureus - MIC*    CIPROFLOXACIN 1 SENSITIVE Sensitive     ERYTHROMYCIN >=8 RESISTANT Resistant     GENTAMICIN <=0.5 SENSITIVE Sensitive     OXACILLIN 0.5 SENSITIVE Sensitive     TETRACYCLINE <=1 SENSITIVE Sensitive     VANCOMYCIN 1 SENSITIVE Sensitive     TRIMETH/SULFA <=10 SENSITIVE Sensitive     CLINDAMYCIN RESISTANT Resistant     RIFAMPIN <=0.5 SENSITIVE Sensitive     Inducible Clindamycin POSITIVE Resistant     LINEZOLID 2 SENSITIVE Sensitive     * STAPHYLOCOCCUS AUREUS   Streptococcus species - MIC*    PENICILLIN 0.5 INTERMEDIATE Intermediate     CEFTRIAXONE 0.25  SENSITIVE Sensitive     ERYTHROMYCIN <=0.12 SENSITIVE Sensitive     LEVOFLOXACIN 1 SENSITIVE Sensitive     VANCOMYCIN 1 SENSITIVE Sensitive     * STREPTOCOCCUS SPECIES  Blood Culture ID Panel (Reflexed)     Status: Abnormal   Collection Time: 08/05/23  2:00 PM  Result Value Ref Range Status   Enterococcus faecalis NOT DETECTED NOT DETECTED Final   Enterococcus Faecium NOT DETECTED NOT DETECTED Final   Listeria monocytogenes NOT DETECTED NOT DETECTED Final   Staphylococcus species DETECTED (A) NOT DETECTED Final    Comment: CRITICAL RESULT CALLED TO, READ BACK BY AND VERIFIED WITH: OLDLAND RN 08/06/2023 @ 0703 BY AB    Staphylococcus aureus (BCID) DETECTED (A) NOT DETECTED Final    Comment: CRITICAL RESULT CALLED TO, READ BACK BY AND VERIFIED WITH: OLDLAND RN 08/06/2023 @ 0703 BY AB    Staphylococcus epidermidis NOT DETECTED NOT DETECTED Final   Staphylococcus lugdunensis NOT DETECTED NOT DETECTED Final   Streptococcus species NOT DETECTED NOT DETECTED Final   Streptococcus agalactiae NOT DETECTED NOT DETECTED Final   Streptococcus pneumoniae NOT DETECTED NOT DETECTED Final   Streptococcus pyogenes NOT DETECTED NOT DETECTED Final   A.calcoaceticus-baumannii NOT DETECTED NOT DETECTED Final   Bacteroides fragilis NOT DETECTED NOT DETECTED Final   Enterobacterales NOT DETECTED NOT DETECTED Final   Enterobacter cloacae complex NOT DETECTED NOT DETECTED Final   Escherichia coli NOT DETECTED NOT DETECTED Final   Klebsiella aerogenes NOT DETECTED NOT DETECTED Final   Klebsiella oxytoca NOT DETECTED NOT DETECTED Final   Klebsiella pneumoniae NOT DETECTED NOT DETECTED Final  Proteus species NOT DETECTED NOT DETECTED Final   Salmonella species NOT DETECTED NOT DETECTED Final   Serratia marcescens NOT DETECTED NOT DETECTED Final   Haemophilus influenzae NOT DETECTED NOT DETECTED Final   Neisseria meningitidis NOT DETECTED NOT DETECTED Final   Pseudomonas aeruginosa NOT DETECTED NOT  DETECTED Final   Stenotrophomonas maltophilia NOT DETECTED NOT DETECTED Final   Candida albicans NOT DETECTED NOT DETECTED Final   Candida auris NOT DETECTED NOT DETECTED Final   Candida glabrata NOT DETECTED NOT DETECTED Final   Candida krusei NOT DETECTED NOT DETECTED Final   Candida parapsilosis NOT DETECTED NOT DETECTED Final   Candida tropicalis NOT DETECTED NOT DETECTED Final   Cryptococcus neoformans/gattii NOT DETECTED NOT DETECTED Final   Meth resistant mecA/C and MREJ NOT DETECTED NOT DETECTED Final    Comment: Performed at [facility], [address] [place], [place] [address]  Culture, blood (routine x 2)     Status: None   Collection Time: 08/05/23  2:12 PM   Specimen: BLOOD  Result Value Ref Range Status   Specimen Description BLOOD BLOOD RIGHT ARM  Final   Special Requests   Final    BOTTLES DRAWN AEROBIC AND ANAEROBIC Blood Culture results may not be optimal due to an inadequate volume of blood received in culture bottles   Culture   Final    NO GROWTH 5 DAYS Performed at [facility], [address], [place], [place] [address]    Report Status 08/10/2023 FINAL  Final  Blood Culture (routine x 2)     Status: None   Collection Time: 08/06/23 11:38 AM   Specimen: BLOOD LEFT HAND  Result Value Ref Range Status   Specimen Description BLOOD LEFT HAND  Final   Special Requests   Final    BOTTLES DRAWN AEROBIC AND ANAEROBIC Blood Culture results may not be optimal due to an inadequate volume of blood received in culture bottles   Culture   Final    NO GROWTH 5 DAYS Performed at [facility], [address] [place], [place] [address]    Report Status 08/11/2023 FINAL  Final  Blood Culture (routine x 2)     Status: None   Collection Time: 08/06/23 11:43 AM   Specimen: BLOOD  Result Value Ref Range Status   Specimen Description BLOOD RIGHT ANTECUBITAL  Final   Special Requests   Final    BOTTLES DRAWN AEROBIC AND ANAEROBIC Blood Culture adequate  volume   Culture   Final    NO GROWTH 5 DAYS Performed at [facility], [address], [place], [place] [address]    Report Status 08/11/2023 FINAL  Final  Respiratory (~20 pathogens) panel by PCR     Status: None   Collection Time: 08/07/23  5:50 AM   Specimen: Nasopharyngeal Swab; Respiratory  Result Value Ref Range Status   Adenovirus NOT DETECTED NOT DETECTED Final   Coronavirus 229E NOT DETECTED NOT DETECTED Final    Comment: (NOTE) The Coronavirus on the Respiratory Panel, DOES NOT test for the novel  Coronavirus (2019 nCoV)    Coronavirus HKU1 NOT DETECTED NOT DETECTED Final   Coronavirus NL63 NOT DETECTED NOT DETECTED Final   Coronavirus OC43 NOT DETECTED NOT DETECTED Final   Metapneumovirus NOT DETECTED NOT DETECTED Final   Rhinovirus / Enterovirus NOT DETECTED NOT DETECTED Final   Influenza A NOT DETECTED NOT DETECTED Final   Influenza B NOT DETECTED NOT DETECTED Final  Parainfluenza Virus 1 NOT DETECTED NOT DETECTED Final   Parainfluenza Virus 2 NOT DETECTED NOT DETECTED Final   Parainfluenza Virus 3 NOT DETECTED NOT DETECTED Final   Parainfluenza Virus 4 NOT DETECTED NOT DETECTED Final   Respiratory Syncytial Virus NOT DETECTED NOT DETECTED Final   Bordetella pertussis NOT DETECTED NOT DETECTED Final   Bordetella Parapertussis NOT DETECTED NOT DETECTED Final   Chlamydophila pneumoniae NOT DETECTED NOT DETECTED Final   Mycoplasma pneumoniae NOT DETECTED NOT DETECTED Final    Comment: Performed at [facility], [address], [place], [place] [address]  Culture, Respiratory w Gram Stain     Status: None   Collection Time: 08/07/23  7:36 AM   Specimen: SPU; Respiratory  Result Value Ref Range Status   Specimen Description SPUTUM  Final   Special Requests NONE  Final   Gram Stain   Final    MODERATE WBC PRESENT, PREDOMINANTLY PMN FEW GRAM POSITIVE COCCI RARE GRAM POSITIVE RODS    Culture   Final    MODERATE Normal respiratory flora-no Staph aureus  or Pseudomonas seen Performed at [facility], [address] [place], [place] [address]    Report Status 08/09/2023 FINAL  Final     Labs: BNP (last 3 results) Recent Labs    05/17/23 0801 08/05/23 1400  BNP 507.0* 1,648.2*   Basic Metabolic Panel: Recent Labs  Lab 08/05/23 1400 08/05/23 1406 08/06/23 1218 08/07/23 0523 08/07/23 0916 08/09/23 0541 08/10/23 1055 08/11/23 0644  NA 143   < > 140 140  --  137 139 138  K 2.7*   < > 3.9 3.4*  --  3.2* 3.2* 3.7  CL 117*   < > 109 108  --  101 107 104  CO2 17*  --  25 26  --  27 25 27   GLUCOSE 92   < > 112* 98  --  94 128* 98  BUN 10   < > 10 9  --  14 12 12   CREATININE 1.33*   < > 1.63* 1.63*  --  2.43* 1.77* 1.84*  CALCIUM 6.9*  --  9.1 8.9  --  9.2 8.7* 9.1  MG 1.6*  --   --   --  2.1  --   --   --   PHOS  --   --   --   --  2.6  --   --   --    < > = values in this interval not displayed.   Liver Function Tests: Recent Labs  Lab 08/05/23 1400 08/06/23 1218  AST 14* 17  ALT 10 12  ALKPHOS 59 65  BILITOT 0.7 0.8  PROT 5.0* 6.6  ALBUMIN 2.7* 3.4*   CBC: Recent Labs  Lab 08/05/23 1400 08/05/23 1406 08/05/23 1439 08/06/23 1218 08/07/23 0523 08/09/23 0541  WBC 11.4*  --   --  10.4 6.9 6.3  NEUTROABS 9.4*  --   --  8.2*  --  3.6  HGB 12.3* 10.9* 13.6 13.7 12.9* 14.3  HCT 36.6* 32.0* 40.0 40.5 38.7* 42.2  MCV 95.6  --   --  92.3 92.1 91.3  PLT 156  --   --  192 176 212   BNP: Recent Labs  Lab 08/05/23 1400  BNP 1,648.2*   CBG: Recent Labs  Lab 08/08/23 0729 08/09/23 0618 08/10/23 0601 08/11/23 0519 08/12/23 0650  GLUCAP 129* 103* 107* 105* 95   Lipid Profile Recent Labs  08/11/23 0644  CHOL 115  HDL 35*  LDLCALC 58  TRIG 409  CHOLHDL 3.3   Urinalysis    Component Value Date/Time   COLORURINE STRAW (A) 08/06/2023 2155   APPEARANCEUR CLEAR 08/06/2023 2155   LABSPEC 1.008 08/06/2023 2155   PHURINE 5.0 08/06/2023 2155   GLUCOSEU 50 (A) 08/06/2023 2155   HGBUR SMALL (A)  08/06/2023 2155   BILIRUBINUR NEGATIVE 08/06/2023 2155   KETONESUR NEGATIVE 08/06/2023 2155   PROTEINUR 100 (A) 08/06/2023 2155   UROBILINOGEN 0.2 11/16/2012 1004   NITRITE NEGATIVE 08/06/2023 2155   LEUKOCYTESUR NEGATIVE 08/06/2023 2155   Sepsis Labs Recent Labs  Lab 08/05/23 1400 08/06/23 1218 08/07/23 0523 08/09/23 0541  WBC 11.4* 10.4 6.9 6.3   Microbiology Recent Results (from the past 240 hour(s))  SARS Coronavirus 2 by RT PCR (hospital order, performed in [facility] hospital lab) *cepheid single result test*     Status: None   Collection Time: 08/05/23  1:58 PM   Specimen: Nasal Swab  Result Value Ref Range Status   SARS Coronavirus 2 by RT PCR NEGATIVE NEGATIVE Final    Comment: Performed at [facility], [address], [place], [place] [address]  Culture, blood (routine x 2)     Status: Abnormal   Collection Time: 08/05/23  2:00 PM   Specimen: BLOOD  Result Value Ref Range Status   Specimen Description BLOOD BLOOD LEFT ARM  Final   Special Requests   Final    BOTTLES DRAWN AEROBIC AND ANAEROBIC Blood Culture results may not be optimal due to an inadequate volume of blood received in culture bottles   Culture  Setup Time   Final    GRAM POSITIVE COCCI IN BOTH AEROBIC AND ANAEROBIC BOTTLES CRITICAL RESULT CALLED TO, READ BACK BY AND VERIFIED WITH: OLDLAND RN 08/06/2023 @ 0703 BY AB Performed at [facility], [address], [place], [place] [address]    Culture STAPHYLOCOCCUS AUREUS STREPTOCOCCUS SPECIES  (A)  Final   Report Status 08/10/2023 FINAL  Final   Organism ID, Bacteria STAPHYLOCOCCUS AUREUS  Final   Organism ID, Bacteria STREPTOCOCCUS SPECIES  Final      Susceptibility   Staphylococcus aureus - MIC*    CIPROFLOXACIN 1 SENSITIVE Sensitive     ERYTHROMYCIN >=8 RESISTANT Resistant     GENTAMICIN <=0.5 SENSITIVE Sensitive     OXACILLIN 0.5 SENSITIVE Sensitive     TETRACYCLINE <=1 SENSITIVE Sensitive     VANCOMYCIN 1 SENSITIVE Sensitive      TRIMETH/SULFA <=10 SENSITIVE Sensitive     CLINDAMYCIN RESISTANT Resistant     RIFAMPIN <=0.5 SENSITIVE Sensitive     Inducible Clindamycin POSITIVE Resistant     LINEZOLID 2 SENSITIVE Sensitive     * STAPHYLOCOCCUS AUREUS   Streptococcus species - MIC*    PENICILLIN 0.5 INTERMEDIATE Intermediate     CEFTRIAXONE 0.25 SENSITIVE Sensitive     ERYTHROMYCIN <=0.12 SENSITIVE Sensitive     LEVOFLOXACIN 1 SENSITIVE Sensitive     VANCOMYCIN 1 SENSITIVE Sensitive     * STREPTOCOCCUS SPECIES  Blood Culture ID Panel (Reflexed)     Status: Abnormal   Collection Time: 08/05/23  2:00 PM  Result Value Ref Range Status   Enterococcus faecalis NOT DETECTED NOT DETECTED Final   Enterococcus Faecium NOT DETECTED NOT DETECTED Final   Listeria monocytogenes NOT DETECTED NOT DETECTED Final   Staphylococcus species DETECTED (A) NOT DETECTED Final    Comment: CRITICAL RESULT CALLED TO, READ BACK BY  AND VERIFIED WITH: OLDLAND RN 08/06/2023 @ 0703 BY AB    Staphylococcus aureus (BCID) DETECTED (A) NOT DETECTED Final    Comment: CRITICAL RESULT CALLED TO, READ BACK BY AND VERIFIED WITH: OLDLAND RN 08/06/2023 @ 0703 BY AB    Staphylococcus epidermidis NOT DETECTED NOT DETECTED Final   Staphylococcus lugdunensis NOT DETECTED NOT DETECTED Final   Streptococcus species NOT DETECTED NOT DETECTED Final   Streptococcus agalactiae NOT DETECTED NOT DETECTED Final   Streptococcus pneumoniae NOT DETECTED NOT DETECTED Final   Streptococcus pyogenes NOT DETECTED NOT DETECTED Final   A.calcoaceticus-baumannii NOT DETECTED NOT DETECTED Final   Bacteroides fragilis NOT DETECTED NOT DETECTED Final   Enterobacterales NOT DETECTED NOT DETECTED Final   Enterobacter cloacae complex NOT DETECTED NOT DETECTED Final   Escherichia coli NOT DETECTED NOT DETECTED Final   Klebsiella aerogenes NOT DETECTED NOT DETECTED Final   Klebsiella oxytoca NOT DETECTED NOT DETECTED Final   Klebsiella pneumoniae NOT DETECTED NOT DETECTED  Final   Proteus species NOT DETECTED NOT DETECTED Final   Salmonella species NOT DETECTED NOT DETECTED Final   Serratia marcescens NOT DETECTED NOT DETECTED Final   Haemophilus influenzae NOT DETECTED NOT DETECTED Final   Neisseria meningitidis NOT DETECTED NOT DETECTED Final   Pseudomonas aeruginosa NOT DETECTED NOT DETECTED Final   Stenotrophomonas maltophilia NOT DETECTED NOT DETECTED Final   Candida albicans NOT DETECTED NOT DETECTED Final   Candida auris NOT DETECTED NOT DETECTED Final   Candida glabrata NOT DETECTED NOT DETECTED Final   Candida krusei NOT DETECTED NOT DETECTED Final   Candida parapsilosis NOT DETECTED NOT DETECTED Final   Candida tropicalis NOT DETECTED NOT DETECTED Final   Cryptococcus neoformans/gattii NOT DETECTED NOT DETECTED Final   Meth resistant mecA/C and MREJ NOT DETECTED NOT DETECTED Final    Comment: Performed at [facility], [address], [place], [place] [address]  Culture, blood (routine x 2)     Status: None   Collection Time: 08/05/23  2:12 PM   Specimen: BLOOD  Result Value Ref Range Status   Specimen Description BLOOD BLOOD RIGHT ARM  Final   Special Requests   Final    BOTTLES DRAWN AEROBIC AND ANAEROBIC Blood Culture results may not be optimal due to an inadequate volume of blood received in culture bottles   Culture   Final    NO GROWTH 5 DAYS Performed at [facility], [address] [place], [place] [address]    Report Status 08/10/2023 FINAL  Final  Blood Culture (routine x 2)     Status: None   Collection Time: 08/06/23 11:38 AM   Specimen: BLOOD LEFT HAND  Result Value Ref Range Status   Specimen Description BLOOD LEFT HAND  Final   Special Requests   Final    BOTTLES DRAWN AEROBIC AND ANAEROBIC Blood Culture results may not be optimal due to an inadequate volume of blood received in culture bottles   Culture   Final    NO GROWTH 5 DAYS Performed at [facility], [address], [place], [place] [address]     Report Status 08/11/2023 FINAL  Final  Blood Culture (routine x 2)     Status: None   Collection Time: 08/06/23 11:43 AM   Specimen: BLOOD  Result Value Ref Range Status   Specimen Description BLOOD RIGHT ANTECUBITAL  Final   Special Requests   Final    BOTTLES DRAWN AEROBIC AND ANAEROBIC Blood Culture adequate volume  Culture   Final    NO GROWTH 5 DAYS Performed at [facility], [address] [place], [place] [address]    Report Status 08/11/2023 FINAL  Final  Respiratory (~20 pathogens) panel by PCR     Status: None   Collection Time: 08/07/23  5:50 AM   Specimen: Nasopharyngeal Swab; Respiratory  Result Value Ref Range Status   Adenovirus NOT DETECTED NOT DETECTED Final   Coronavirus 229E NOT DETECTED NOT DETECTED Final    Comment: (NOTE) The Coronavirus on the Respiratory Panel, DOES NOT test for the novel  Coronavirus (2019 nCoV)    Coronavirus HKU1 NOT DETECTED NOT DETECTED Final   Coronavirus NL63 NOT DETECTED NOT DETECTED Final   Coronavirus OC43 NOT DETECTED NOT DETECTED Final   Metapneumovirus NOT DETECTED NOT DETECTED Final   Rhinovirus / Enterovirus NOT DETECTED NOT DETECTED Final   Influenza A NOT DETECTED NOT DETECTED Final   Influenza B NOT DETECTED NOT DETECTED Final   Parainfluenza Virus 1 NOT DETECTED NOT DETECTED Final   Parainfluenza Virus 2 NOT DETECTED NOT DETECTED Final   Parainfluenza Virus 3 NOT DETECTED NOT DETECTED Final   Parainfluenza Virus 4 NOT DETECTED NOT DETECTED Final   Respiratory Syncytial Virus NOT DETECTED NOT DETECTED Final   Bordetella pertussis NOT DETECTED NOT DETECTED Final   Bordetella Parapertussis NOT DETECTED NOT DETECTED Final   Chlamydophila pneumoniae NOT DETECTED NOT DETECTED Final   Mycoplasma pneumoniae NOT DETECTED NOT DETECTED Final    Comment: Performed at [facility], [address] [place], [place] [address]  Culture, Respiratory w Gram Stain     Status: None   Collection Time: 08/07/23  7:36 AM    Specimen: SPU; Respiratory  Result Value Ref Range Status   Specimen Description SPUTUM  Final   Special Requests NONE  Final   Gram Stain   Final    MODERATE WBC PRESENT, PREDOMINANTLY PMN FEW GRAM POSITIVE COCCI RARE GRAM POSITIVE RODS    Culture   Final    MODERATE Normal respiratory flora-no Staph aureus or Pseudomonas seen Performed at [facility], [address], [place], [place] [address]    Report Status 08/09/2023 FINAL  Final    Procedures/Studies: [place] EKG SITE RITE  Result Date: 08/11/2023 If Site Rite image not attached, placement could not be confirmed due to current cardiac rhythm.  [place] EKG SITE RITE  Result Date: 08/10/2023 If Site Rite image not attached, placement could not be confirmed due to current cardiac rhythm.  ECHO TEE  Result Date: 08/09/2023    TRANSESOPHOGEAL ECHO REPORT   Patient Name:   LATRAVION SCHMALZRIED Date of Exam: 08/09/2023 Medical Rec #:  [id number]       Height:       72.0 in Accession #:    [id number]      Weight:       215.8 lb Date of Birth:  11/24/1955       BSA:          2.201 m Patient Age:    67 years        BP:           153/95 mmHg Patient Gender: M               HR:           75 bpm. Exam Location:  Inpatient Procedure: Transesophageal Echo, Cardiac Doppler and Color Doppler Indications:     Endocarditis  History:  Patient has prior history of Echocardiogram examinations, most                  recent 08/07/2023. CHF; Risk Factors:Hypertension and Sleep                  Apnea.  Sonographer:     Darlys Gales Referring Phys:  [id number] Cyndi Bender Diagnosing Phys: Thomasene Ripple DO PROCEDURE: After discussion of the risks and benefits of a TEE, an informed consent was obtained from the patient. The transesophogeal probe was passed without difficulty through the esophogus of the patient. Sedation performed by different physician. The patient was monitored while under deep sedation. Anesthestetic sedation was provided intravenously by  Anesthesiology: 470mg  of Propofol. The patient developed no complications during the procedure.  IMPRESSIONS  1. Left ventricular ejection fraction, by estimation, is 60 to 65%. The left ventricle has normal function.  2. There is a mobile mass (0.5 cm x 0.8 cm )in the right ventricle suspected either on the chordae or the wall of the ventricle. Right ventricular systolic function is normal. The right ventricular size is normal.  3. No left atrial/left atrial appendage thrombus was detected. The LAA emptying velocity was 104 cm/s.  4. There is fibrinous mobile mass of the posterior leaflet of the mitral valve.. The mitral valve is abnormal. Trivial mitral valve regurgitation. No evidence of mitral stenosis.  5. The aortic valve is normal in structure. There is moderate thickening of the aortic valve. Aortic valve regurgitation is not visualized. No aortic stenosis is present.  6. There is borderline dilatation of the aortic root, measuring 38 mm. FINDINGS  Left Ventricle: Left ventricular ejection fraction, by estimation, is 60 to 65%. The left ventricle has normal function. The left ventricular internal cavity size was normal in size. Right Ventricle: There is a mobile mass (0.5 cm x 0.8 cm )in the right ventricle suspected either on the chordae or the wall of the ventricle. The right ventricular size is normal. Right vetricular wall thickness was not well visualized. Right ventricular systolic function is normal. Left Atrium: Left atrial size was not well visualized. No left atrial/left atrial appendage thrombus was detected. The LAA emptying velocity was 104 cm/s. Right Atrium: Right atrial size was not well visualized. Pericardium: Trivial pericardial effusion is present. The pericardial effusion is anterior to the right ventricle. Mitral Valve: There is fibrinous mobile mass of the posterior leaflet of the mitral valve. The mitral valve is abnormal. There is mild thickening of the mitral valve leaflet(s).  Trivial mitral valve regurgitation. No evidence of mitral valve stenosis. Tricuspid Valve: The tricuspid valve is grossly normal. Tricuspid valve regurgitation is mild . No evidence of tricuspid stenosis. Aortic Valve: The aortic valve is normal in structure. There is moderate thickening of the aortic valve. Aortic valve regurgitation is not visualized. No aortic stenosis is present. Pulmonic Valve: The pulmonic valve was not well visualized. Pulmonic valve regurgitation is trivial. Aorta: There is borderline dilatation of the aortic root, measuring 38 mm. Venous: The right upper pulmonary vein, right lower pulmonary vein, left upper pulmonary vein and left lower pulmonary vein are normal. IAS/Shunts: No atrial level shunt detected by color flow Doppler. Thomasene Ripple DO Electronically signed by Thomasene Ripple DO Signature Date/Time: 08/09/2023/4:31:01 PM    Final    EP STUDY  Result Date: 08/09/2023 See surgical note for result.  [place] RT LOWER EXTREM LTD SOFT TISSUE NON VASCULAR  Result Date: 08/08/2023 CLINICAL DATA:  Right calf swelling. EXAM:  ULTRASOUND RIGHT LOWER EXTREMITY LIMITED TECHNIQUE: Ultrasound examination of the lower extremity soft tissues was performed in the area of clinical concern. COMPARISON:  Bilateral lower extremity deep venous ultrasound 12/10/2020 (negative) FINDINGS: In the area of interest of the right calf, there is a well-circumscribed small fluid focus that appears to be surrounding a fat lobule, measuring up to approximately 1.1 x 0.5 x 0.7 cm. Mild internal echoes. No internal color flow vascularity. IMPRESSION: In the area of interest of the right calf, there is a small fluid focus that appears to be surrounding a fat lobule, measuring up to approximately 1.1 x 0.5 x 0.7 cm. This is nonspecific and may represent a small region of coalescing edema or small seroma. No increased vascularity is seen to indicate an abscess. Electronically Signed   By: Neita Garnet M.D.   On: 08/08/2023  18:40   ECHOCARDIOGRAM COMPLETE  Result Date: 08/07/2023    ECHOCARDIOGRAM REPORT   Patient Name:   CORDARIUS LUTTRELL Date of Exam: 08/07/2023 Medical Rec #:  [id number]       Height:       72.0 in Accession #:    [id number]      Weight:       214.5 lb Date of Birth:  06/11/1956       BSA:          2.195 m Patient Age:    67 years        BP:           154/99 mmHg Patient Gender: M               HR:           65 bpm. Exam Location:  Inpatient Procedure: 2D Echo, Color Doppler, Cardiac Doppler and Strain Analysis Indications:    Bactermia                 Congestive Heart Failure  History:        Patient has prior history of Echocardiogram examinations, most                 recent 05/07/2022. CHF, Stroke and CVA; Risk Factors:Dyslipidemia,                 Sleep Apnea and Current Smoker. GERD, Chronic Kidney Disease,                 History of Pulmonary Embolism.  Sonographer:    Raeford Razor RDCS Referring Phys: [id number] SAGAR H JINWALA IMPRESSIONS  1. Left ventricular ejection fraction, by estimation, is 60 to 65%. The left ventricle has normal function. The left ventricle has no regional wall motion abnormalities. There is mild concentric left ventricular hypertrophy. Left ventricular diastolic parameters are consistent with Grade I diastolic dysfunction (impaired relaxation).  2. Right ventricular systolic function is normal. The right ventricular size is normal. Tricuspid regurgitation signal is inadequate for assessing PA pressure.  3. The mitral valve is grossly normal. Trivial mitral valve regurgitation. No evidence of mitral stenosis.  4. The aortic valve is tricuspid. Aortic valve regurgitation is trivial. No aortic stenosis is present.  5. The inferior vena cava is normal in size with greater than 50% respiratory variability, suggesting right atrial pressure of 3 mmHg. Comparison(s): No significant change from prior study. FINDINGS  Left Ventricle: Left ventricular ejection fraction, by estimation, is 60 to 65%.  The left ventricle has normal function. The left ventricle has no regional wall motion abnormalities. The left ventricular  internal cavity size was normal in size. There is  mild concentric left ventricular hypertrophy. Left ventricular diastolic parameters are consistent with Grade I diastolic dysfunction (impaired relaxation). Right Ventricle: The right ventricular size is normal. No increase in right ventricular wall thickness. Right ventricular systolic function is normal. Tricuspid regurgitation signal is inadequate for assessing PA pressure. Left Atrium: Left atrial size was normal in size. Right Atrium: Right atrial size was normal in size. Pericardium: There is no evidence of pericardial effusion. Presence of epicardial fat layer. Mitral Valve: The mitral valve is grossly normal. Trivial mitral valve regurgitation. No evidence of mitral valve stenosis. Tricuspid Valve: The tricuspid valve is grossly normal. Tricuspid valve regurgitation is trivial. No evidence of tricuspid stenosis. Aortic Valve: The aortic valve is tricuspid. Aortic valve regurgitation is trivial. No aortic stenosis is present. Aortic valve mean gradient measures 5.0 mmHg. Aortic valve peak gradient measures 11.2 mmHg. Aortic valve area, by VTI measures 3.04 cm. Pulmonic Valve: The pulmonic valve was grossly normal. Pulmonic valve regurgitation is not visualized. No evidence of pulmonic stenosis. Aorta: The aortic root and ascending aorta are structurally normal, with no evidence of dilitation. Venous: The inferior vena cava is normal in size with greater than 50% respiratory variability, suggesting right atrial pressure of 3 mmHg. IAS/Shunts: The atrial septum is grossly normal.  LEFT VENTRICLE PLAX 2D LVIDd:         5.80 cm   Diastology LVIDs:         3.30 cm   LV e' medial:    5.66 cm/s LV PW:         1.30 cm   LV E/e' medial:  9.5 LV IVS:        1.30 cm   LV e' lateral:   10.70 cm/s LVOT diam:     2.20 cm   LV E/e' lateral: 5.0 LV SV:          98 LV SV Index:   45        2D Longitudinal Strain LVOT Area:     3.80 cm  2D Strain GLS Avg:     -20.1 %  RIGHT VENTRICLE             IVC RV Basal diam:  3.30 cm     IVC diam: 2.00 cm RV Mid diam:    2.30 cm RV S prime:     17.30 cm/s TAPSE (M-mode): 2.1 cm LEFT ATRIUM             Index        RIGHT ATRIUM           Index LA diam:        3.00 cm 1.37 cm/m   RA Area:     25.10 cm LA Vol (A2C):   78.8 ml 35.90 ml/m  RA Volume:   85.40 ml  38.91 ml/m LA Vol (A4C):   33.6 ml 15.31 ml/m LA Biplane Vol: 51.8 ml 23.60 ml/m  AORTIC VALVE AV Area (Vmax):    3.03 cm AV Area (Vmean):   3.00 cm AV Area (VTI):     3.04 cm AV Vmax:           167.00 cm/s AV Vmean:          104.000 cm/s AV VTI:            0.321 m AV Peak Grad:      11.2 mmHg AV Mean Grad:      5.0 mmHg  LVOT Vmax:         133.00 cm/s LVOT Vmean:        82.100 cm/s LVOT VTI:          0.257 m LVOT/AV VTI ratio: 0.80  AORTA Ao Root diam: 3.70 cm Ao Asc diam:  3.90 cm MITRAL VALVE MV Area (PHT): 2.82 cm    SHUNTS MV Decel Time: 269 msec    Systemic VTI:  0.26 m MV E velocity: 54.00 cm/s  Systemic Diam: 2.20 cm MV A velocity: 80.20 cm/s MV E/A ratio:  0.67 Lennie Odor MD Electronically signed by Lennie Odor MD Signature Date/Time: 08/07/2023/11:56:37 AM    Final    MR LUMBAR SPINE WO CONTRAST  Result Date: 08/07/2023 CLINICAL DATA:  Initial evaluation for bacteremia. Evaluate for infection. EXAM: MRI LUMBAR SPINE WITHOUT CONTRAST TECHNIQUE: Multiplanar, multisequence MR imaging of the lumbar spine was performed. No intravenous contrast was administered. COMPARISON:  Prior MRI from 06/12/2022. FINDINGS: Segmentation: Standard. Lowest well-formed disc space labeled the L5-S1 level. Alignment: 3 mm degenerative retrolisthesis of L2 on L3, stable. Alignment otherwise normal preservation of the normal lumbar lordosis. Vertebrae: Susceptibility artifact related to prior PLIF at L3-4 and L4-5. Additional left lateral fixation present at L4-5 as well.  Vertebral body height maintained without acute or chronic fracture. Bone marrow signal intensity within normal limits. No worrisome osseous lesions. Reactive endplate change with marrow edema about the L2-3 interspace appears to be degenerative in nature, progressed from prior. No evidence for osteomyelitis discitis or septic arthritis. Conus medullaris and cauda equina: Conus extends to the L1 level. Conus and cauda equina appear normal. No epidural collections. Paraspinal and other soft tissues: Chronic postoperative scarring present within the posterior paraspinous soft tissues. No acute inflammatory changes. Mild edema within the subcutaneous fat of the lower back, likely related to overall volume status. 5 mm T2 hyperintense left renal cyst, benign in appearance, no follow-up imaging recommended. Disc levels: L1-2: Normal interspace. Mild bilateral facet hypertrophy. No spinal stenosis. Foramina remain patent. L2-3: 3 mm retrolisthesis with degenerative intervertebral disc space narrowing. Circumferential disc bulge with reactive endplate change. Moderate facet and ligament flavum hypertrophy. Resultant mild to moderate canal with bilateral subarticular stenosis. Moderate bilateral L3 foraminal narrowing. L3-4: Prior PLIF. No residual spinal stenosis. Foramina appear patent. L4-5: Prior PLIF. No residual spinal stenosis. Foramina appear patent. L5-S1: Disc desiccation with mild disc bulge. Moderate left worse than right facet hypertrophy. No spinal stenosis. Foramina remain patent. IMPRESSION: 1. No MRI evidence for acute infection within the lumbar spine. 2. Prior PLIF at L3-4 and L4-5 without residual or recurrent stenosis. 3. 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 Reactive endplate changes with marrow edema at this level, felt to be degenerative in nature, and progressed as compared to prior  MRI from 06/12/2022. Electronically Signed   By: Rise Mu M.D.   On: 08/07/2023 04:48   MR THORACIC SPINE WO CONTRAST  Result Date: 08/07/2023 CLINICAL DATA:  Initial evaluation for bacteremia, evaluate for infection. EXAM: MRI THORACIC SPINE WITHOUT CONTRAST TECHNIQUE: Multiplanar, multisequence MR imaging of the thoracic spine was performed. No intravenous contrast was administered. COMPARISON:  None Available. FINDINGS: Alignment: Mild dextroscoliosis. 4 mm degenerative anterolisthesis of C7 on T1, likely chronic and facet mediated. Vertebrae: Vertebral body height maintained without acute or chronic fracture. Bone marrow signal intensity within normal limits. Few scattered subcentimeter benign hemangiomata noted. No worrisome osseous lesions. Reactive  endplate change with marrow edema present about the C7-T1 interspace and bilateral C7-T1 facets, likely degenerative. No other evidence for osteomyelitis discitis or septic arthritis within the thoracic spine. Cord:  Normal signal and morphology.  No epidural collections. Paraspinal and other soft tissues: Paraspinous soft tissues demonstrate no acute finding. Trace layering bilateral pleural effusions noted. Disc levels: C7-T1: 4 mm anterolisthesis. Associated broad posterior pseudo disc bulge/uncovering. Moderate bilateral facet hypertrophy. No significant spinal stenosis. Moderate bilateral C8 foraminal narrowing. T1-2: Small left paracentral disc protrusion mildly indents the ventral thecal sac. No significant spinal stenosis. Foramina remain patent. T3-4: Small central disc protrusion mildly indents the ventral thecal sac. No stenosis. T7-8: Small central disc protrusion minimally indents the ventral thecal sac. No stenosis. Otherwise, no other significant disc pathology seen within the thoracic spine. No significant canal or foraminal stenosis or evidence for neural impingement. IMPRESSION: 1. No evidence for acute infection within the  thoracic spine. 2. 4 mm degenerative anterolisthesis of C7 on T1 with resultant moderate bilateral C8 foraminal stenosis. 3. Small noncompressive disc protrusions at T1-2, T3-4, and T8-9 without stenosis. 4. Trace layering bilateral pleural effusions. Electronically Signed   By: Rise Mu M.D.   On: 08/07/2023 04:39   DG Chest Port 1 View  Result Date: 08/06/2023 CLINICAL DATA:  Questionable sepsis EXAM: PORTABLE CHEST 1 VIEW COMPARISON:  Yesterday FINDINGS: Streaky density in the lower lungs, stable. There is bronchopneumonia by recent CT. Borderline heart size is stable. No edema, effusion, or pneumothorax. IMPRESSION: Stable bronchopneumonia at the lung bases. Electronically Signed   By: Tiburcio Pea M.D.   On: 08/06/2023 12:16   CT Angio Chest/Abd/Pel for Dissection W and/or W/WO  Result Date: 08/05/2023 CLINICAL DATA:  Chest pain shortness of breath. Concern for acute aortic syndrome. EXAM: CT ANGIOGRAPHY CHEST, ABDOMEN AND PELVIS TECHNIQUE: Non-contrast CT of the chest was initially obtained. Multidetector CT imaging through the chest, abdomen and pelvis was performed using the standard protocol during bolus administration of intravenous contrast. Multiplanar reconstructed images and MIPs were obtained and reviewed to evaluate the vascular anatomy. RADIATION DOSE REDUCTION: This exam was performed according to the departmental dose-optimization program which includes automated exposure control, adjustment of the mA and/or kV according to patient size and/or use of iterative reconstruction technique. CONTRAST:  75mL OMNIPAQUE IOHEXOL 350 MG/ML SOLN COMPARISON:  CT abdomen pelvis, 07/20/2023.  CTA chest, 05/17/2023. FINDINGS: CTA CHEST FINDINGS Cardiovascular: Ascending thoracic aorta measures 4 cm in diameter. No dissection. Minor atherosclerosis. No atherosclerotic ulcer. Aortic arch branch vessels are widely patent. Heart is normal in size. Three-vessel coronary artery calcifications.  No pericardial effusion. Mediastinum/Nodes: No neck base, mediastinal or hilar masses. No enlarged lymph nodes. Multiple old calcified mediastinal and bilateral hilar nodes are noted, stable. Trachea esophagus are unremarkable. Lungs/Pleura: Patchy small confluent and ground-glass opacities no in the right middle and lower lobe, to a lesser degree in the left upper lobe lingula. Additional linear opacities noted in the left lower lobe at the lung base consistent with atelectasis. Linear opacity noted in the right lateral upper lobe, consistent with scarring or atelectasis. Compared to the prior CT angiogram of the chest from 05/17/2023, left lower lobe atelectasis is improved. Other patchy areas of opacity are new. No pleural effusion or pneumothorax. Musculoskeletal: No fracture or acute finding. No bone lesion. No chest wall mass. Review of the MIP images confirms the above findings. CTA ABDOMEN AND PELVIS FINDINGS VASCULAR Aorta: Minor atherosclerosis.  No aneurysm or dissection. Celiac: Patent without evidence of  aneurysm, dissection, vasculitis or significant stenosis. SMA: Patent without evidence of aneurysm, dissection, vasculitis or significant stenosis. Renals: Both renal arteries are patent without evidence of aneurysm, dissection, vasculitis, fibromuscular dysplasia or significant stenosis. IMA: Patent without evidence of aneurysm, dissection, vasculitis or significant stenosis. Inflow: Patent without evidence of aneurysm, dissection, vasculitis or significant stenosis. Veins: No obvious venous abnormality within the limitations of this arterial phase study. Review of the MIP images confirms the above findings. NON-VASCULAR Hepatobiliary: No focal liver abnormality is seen. Status post cholecystectomy. No biliary dilatation. Pancreas: Unremarkable. No pancreatic ductal dilatation or surrounding inflammatory changes. Spleen: Normal in size without focal abnormality. Adrenals/Urinary Tract: No adrenal  masses. Bilateral renal cortical thinning. Stable nonobstructing stone, lower pole the left kidney. No renal masses. No other intrarenal stones. No hydronephrosis. Normal ureters. Normal bladder. Stomach/Bowel: Stomach is within normal limits. Appendix appears normal. No evidence of bowel wall thickening, distention, or inflammatory changes. Lymphatic: No enlarged lymph nodes. Reproductive: Unremarkable. Other: No abdominal wall hernia or abnormality. No abdominopelvic ascites. Musculoskeletal: No fracture or acute finding. No bone lesion. Stable changes from a prior fusion, L3 through L5. Review of the MIP images confirms the above findings. IMPRESSION: 1. No evidence of acute aortic syndrome. No aortic aneurysm or dissection. 2. Patchy small confluent and ground-glass opacities in the right middle and lower lobe, to a lesser degree in the left upper lobe lingula. These are new since the prior CT angiogram of the chest from 05/17/2023 and are consistent with an infectious or inflammatory process. 3. Three-vessel coronary artery calcifications. 4. No acute findings within the abdomen or pelvis. Electronically Signed   By: Amie Portland M.D.   On: 08/05/2023 16:20   CT HEAD WO CONTRAST  Result Date: 08/05/2023 CLINICAL DATA:  Headache, new onset (Age >= 51y) EXAM: CT HEAD WITHOUT CONTRAST TECHNIQUE: Contiguous axial images were obtained from the base of the skull through the vertex without intravenous contrast. RADIATION DOSE REDUCTION: This exam was performed according to the departmental dose-optimization program which includes automated exposure control, adjustment of the mA and/or kV according to patient size and/or use of iterative reconstruction technique. COMPARISON:  CT head June 23, 2022 FINDINGS: Brain: No evidence of acute infarction, hemorrhage, hydrocephalus, extra-axial collection or mass lesion/mass effect. Vascular: No hyperdense vessel.  Calcific atherosclerosis. Skull: No acute fracture.  Sinuses/Orbits: Mild mucosal thickening.  No acute orbital findings. IMPRESSION: No evidence of acute intracranial abnormality. Electronically Signed   By: Feliberto Harts M.D.   On: 08/05/2023 15:56   DG Chest Portable 1 View  Result Date: 08/05/2023 CLINICAL DATA:  Chest pain and shortness of breath. EXAM: PORTABLE CHEST 1 VIEW COMPARISON:  05/17/2023. FINDINGS: Cardiac silhouette is mildly enlarged. No mediastinal or hilar masses. Linear and patchy opacity noted at the right lung base suspected to be atelectasis. Pneumonia not excluded. Mild linear opacity at the left lung base consistent with atelectasis. Remainder of the lungs is clear. No convincing pleural effusion.  No pneumothorax. Skeletal structures are grossly intact. IMPRESSION: 1. Right lung base opacity, likely atelectasis. Pneumonia not excluded. 2. Mild left lung base atelectasis. Electronically Signed   By: Amie Portland M.D.   On: 08/05/2023 14:35   CT VIRTUAL COLONOSCOPY DIAGNOSTIC  Result Date: 07/24/2023 CLINICAL DATA:  History of colonic polyps EXAM: CT VIRTUAL COLONOSCOPY DIAGNOSTIC TECHNIQUE: The patient was given a standard bowel preparation with Gastrografin and barium for fluid and stool tagging respectively. The quality of the bowel preparation is fair. Automated CO2 insufflation of the  colon was performed prior to image acquisition and colonic distention is suboptimal. Image post processing was used to generate a 3D endoluminal fly-through projection of the colon and to electronically subtract stool/fluid as appropriate. COMPARISON:  PET CT 05/27/2021 FINDINGS: VIRTUAL COLONOSCOPY There is suboptimal distension of the sigmoid colon and descending colon. These areas are difficult to fully evaluate and fully clear. March tortuosity of the sigmoid colon and descending colon with moderate diverticulosis in these areas. The right colon and transverse colon are also tortuous, but clear of any fixed polypoid filling defects or  annular constricting lesions. Virtual colonoscopy is not designed to detect diminutive polyps (i.e., less than or equal to 5 mm), the presence or absence of which may not affect clinical management. CT ABDOMEN AND PELVIS WITHOUT CONTRAST Lower chest: No acute abnormality. Hepatobiliary: Prior cholecystectomy. No biliary ductal dilatation or focal hepatic abnormality. Pancreas: No focal abnormality or ductal dilatation. Spleen: No focal abnormality.  Normal size. Adrenals/Urinary Tract: 5 mm nonobstructing stone in the lower pole of the left kidney. No hydronephrosis. No renal or adrenal mass. Urinary bladder unremarkable. Stomach/Bowel: Stomach and small bowel decompressed, unremarkable. Vascular/Lymphatic: Scattered aortic atherosclerosis. No evidence of aneurysm or adenopathy. Reproductive: No visible focal abnormality. Other: No free fluid or free air. Musculoskeletal: No acute bony abnormality. Postoperative and degenerative changes in the lumbar spine. IMPRESSION: Suboptimal distention of the descending colon and sigmoid colon due to tortuosity and diverticulosis. I see no definite polypoid filling defect or annular constricting lesions, but these areas are difficult to fully clear. Remainder of the colon demonstrates no suspicious polypoid area or annular constricting lesion. Left nephrolithiasis.  No hydronephrosis. Aortic atherosclerosis. Electronically Signed   By: Charlett Nose M.D.   On: 07/24/2023 20:14    Time coordinating discharge: 45 mins  SIGNED:  Carollee Herter, DO [facility] 08/12/23, 9:32 AM

## 2023-08-12 NOTE — Progress Notes (Signed)
Mobility Specialist Progress Note:   08/12/23 0845  Mobility  Activity Ambulated independently in hallway  Level of Assistance Independent  Assistive Device None  Distance Ambulated (ft) 375 ft  Activity Response Tolerated well  Mobility Referral Yes  Mobility visit 1 Mobility  Mobility Specialist Start Time (ACUTE ONLY) 0835  Mobility Specialist Stop Time (ACUTE ONLY) 0845  Mobility Specialist Time Calculation (min) (ACUTE ONLY) 10 min   Pt received sitting EOB, agreeable to mobility session. Ambulated independently in hallway. Returned to room w/o fault. Left with all needs met.    Feliciana Rossetti Mobility Specialist Please contact via Special educational needs teacher or  Rehab office at [phone]

## 2023-08-13 DIAGNOSIS — N179 Acute kidney failure, unspecified: Secondary | ICD-10-CM | POA: Diagnosis not present

## 2023-08-13 DIAGNOSIS — G629 Polyneuropathy, unspecified: Secondary | ICD-10-CM | POA: Diagnosis not present

## 2023-08-13 DIAGNOSIS — Z7982 Long term (current) use of aspirin: Secondary | ICD-10-CM | POA: Diagnosis not present

## 2023-08-13 DIAGNOSIS — I5032 Chronic diastolic (congestive) heart failure: Secondary | ICD-10-CM | POA: Diagnosis not present

## 2023-08-13 DIAGNOSIS — N400 Enlarged prostate without lower urinary tract symptoms: Secondary | ICD-10-CM | POA: Diagnosis not present

## 2023-08-13 DIAGNOSIS — G3184 Mild cognitive impairment, so stated: Secondary | ICD-10-CM | POA: Diagnosis not present

## 2023-08-13 DIAGNOSIS — G4733 Obstructive sleep apnea (adult) (pediatric): Secondary | ICD-10-CM | POA: Diagnosis not present

## 2023-08-13 DIAGNOSIS — N1832 Chronic kidney disease, stage 3b: Secondary | ICD-10-CM | POA: Diagnosis not present

## 2023-08-13 DIAGNOSIS — F5104 Psychophysiologic insomnia: Secondary | ICD-10-CM | POA: Diagnosis not present

## 2023-08-13 DIAGNOSIS — D869 Sarcoidosis, unspecified: Secondary | ICD-10-CM | POA: Diagnosis not present

## 2023-08-13 DIAGNOSIS — I5020 Unspecified systolic (congestive) heart failure: Secondary | ICD-10-CM | POA: Diagnosis not present

## 2023-08-13 DIAGNOSIS — R7881 Bacteremia: Secondary | ICD-10-CM | POA: Diagnosis not present

## 2023-08-13 DIAGNOSIS — T82898A Other specified complication of vascular prosthetic devices, implants and grafts, initial encounter: Secondary | ICD-10-CM | POA: Diagnosis not present

## 2023-08-13 DIAGNOSIS — Z79899 Other long term (current) drug therapy: Secondary | ICD-10-CM | POA: Diagnosis not present

## 2023-08-13 DIAGNOSIS — T82594A Other mechanical complication of infusion catheter, initial encounter: Secondary | ICD-10-CM | POA: Insufficient documentation

## 2023-08-13 DIAGNOSIS — I130 Hypertensive heart and chronic kidney disease with heart failure and stage 1 through stage 4 chronic kidney disease, or unspecified chronic kidney disease: Secondary | ICD-10-CM | POA: Diagnosis not present

## 2023-08-13 DIAGNOSIS — I2510 Atherosclerotic heart disease of native coronary artery without angina pectoris: Secondary | ICD-10-CM | POA: Diagnosis not present

## 2023-08-13 DIAGNOSIS — I77810 Thoracic aortic ectasia: Secondary | ICD-10-CM | POA: Diagnosis not present

## 2023-08-13 DIAGNOSIS — Z452 Encounter for adjustment and management of vascular access device: Secondary | ICD-10-CM | POA: Diagnosis not present

## 2023-08-13 DIAGNOSIS — J15211 Pneumonia due to Methicillin susceptible Staphylococcus aureus: Secondary | ICD-10-CM | POA: Diagnosis not present

## 2023-08-13 DIAGNOSIS — E785 Hyperlipidemia, unspecified: Secondary | ICD-10-CM | POA: Diagnosis not present

## 2023-08-13 DIAGNOSIS — J440 Chronic obstructive pulmonary disease with acute lower respiratory infection: Secondary | ICD-10-CM | POA: Diagnosis not present

## 2023-08-13 DIAGNOSIS — Z5181 Encounter for therapeutic drug level monitoring: Secondary | ICD-10-CM | POA: Diagnosis not present

## 2023-08-13 DIAGNOSIS — I739 Peripheral vascular disease, unspecified: Secondary | ICD-10-CM | POA: Diagnosis not present

## 2023-08-13 DIAGNOSIS — M109 Gout, unspecified: Secondary | ICD-10-CM | POA: Diagnosis not present

## 2023-08-13 DIAGNOSIS — J45998 Other asthma: Secondary | ICD-10-CM | POA: Diagnosis not present

## 2023-08-13 DIAGNOSIS — E669 Obesity, unspecified: Secondary | ICD-10-CM | POA: Diagnosis not present

## 2023-08-13 DIAGNOSIS — I059 Rheumatic mitral valve disease, unspecified: Secondary | ICD-10-CM | POA: Diagnosis not present

## 2023-08-14 ENCOUNTER — Emergency Department (HOSPITAL_COMMUNITY)
Admission: EM | Admit: 2023-08-14 | Discharge: 2023-08-14 | Disposition: A | Discharge status: Home / Self Care | Payer: PPO | Attending: Emergency Medicine | Admitting: Emergency Medicine

## 2023-08-14 ENCOUNTER — Other Ambulatory Visit: Payer: Self-pay

## 2023-08-14 ENCOUNTER — Encounter (HOSPITAL_COMMUNITY): Payer: Self-pay | Admitting: *Deleted

## 2023-08-14 DIAGNOSIS — T82898A Other specified complication of vascular prosthetic devices, implants and grafts, initial encounter: Secondary | ICD-10-CM

## 2023-08-14 NOTE — ED Triage Notes (Signed)
The pt has a picc line and tonight for the 2200 dose the picc line does not work home health told them to come here and get someone get a poss clot from the line

## 2023-08-14 NOTE — ED Provider Notes (Signed)
[place] EMERGENCY DEPARTMENT AT [facility] Provider Note   CSN: [id number] Arrival date & time: 08/13/23  2353     History  Chief Complaint  Patient presents with   picc line not working    [name] is a 67 y.o. male.  The history is provided by the patient and medical records.    67 y.o. M with recent hospital admission for MSSA bacteremia, discharged 08/12/23 on IV Ancef presenting to the ED with occluded PICC line.  Has been receiving IV ancef at home without issue.  Today home health RN came to administer medications but PICC line would not flush or pull back so sent in for evaluation and management of this.  Otherwise he is feeling fine.  No fever/chills, sweats.  He is eating/drinking well.  No excessive fatigue or weakness.    Home Medications Prior to Admission medications   Medication Sig Start Date End Date Taking? Authorizing Provider  aspirin EC 81 MG tablet Take 81 mg by mouth daily. Patients Wife gave him 4 tablets this am. 08/06/2023.    [provider]  atorvastatin (LIPITOR) 20 MG tablet Take 1 tablet (20 mg total) by mouth daily. 08/12/23 11/10/23  Manuel Herter, DO  ceFAZolin (ANCEF) IVPB Inject 2 g into the vein every 8 (eight) hours. Indication:  MSSA IE First Dose: Yes Last Day of Therapy:  09/17/23 Labs - Once weekly:  CBC/D and BMP, Labs - Once weekly: ESR and CRP Method of administration: IV Push Method of administration may be changed at the discretion of home infusion pharmacist based upon assessment of the patient and/or caregiver's ability to self-administer the medication ordered. 08/10/23 09/17/23  Manuel Herter, DO  diltiazem (CARDIZEM CD) 360 MG 24 hr capsule Take 1 capsule (360 mg total) by mouth daily. 08/12/23 11/10/23  Manuel Herter, DO  hydrALAZINE (APRESOLINE) 100 MG tablet Take 1 tablet (100 mg total) by mouth 2 (two) times daily. 08/12/23 11/10/23  Manuel Herter, DO  ipratropium-albuterol (DUONEB) 0.5-2.5 (3) MG/3ML SOLN Take 3 mLs by  nebulization every 6 (six) hours as needed for up to 20 days (SOB or wheezing). 08/12/23 09/01/23  Manuel Herter, DO  isosorbide mononitrate (IMDUR) 30 MG 24 hr tablet Take 3 tablets (90 mg total) by mouth daily. 08/12/23 11/10/23  Manuel Herter, DO  metoprolol (TOPROL-XL) 200 MG 24 hr tablet Take 1 tablet (200 mg total) by mouth daily. 08/12/23 11/10/23  Manuel Herter, DO  potassium chloride SA (KLOR-CON M) 20 MEQ tablet Take 1 tablet (20 mEq total) by mouth daily as needed (take only if Demadex is taken). 08/12/23 11/10/23  Manuel Herter, DO  tamsulosin (FLOMAX) 0.4 MG CAPS capsule Take 1 capsule (0.4 mg total) by mouth daily. 08/12/23 11/10/23  Manuel Herter, DO  torsemide (DEMADEX) 20 MG tablet Take 1 tablet (20 mg total) by mouth daily as needed (1 lb or more weight gain in 1 day or 3 lbs or more weight gain in 1 week). 08/12/23 11/10/23  Manuel Herter, DO  traZODone (DESYREL) 150 MG tablet Take 1 tablet (150 mg total) by mouth at bedtime as needed for up to 90 doses for sleep. 08/12/23   Manuel Herter, DO  triamcinolone cream (KENALOG) 0.1 % Apply to affected areas twice a day as needed for itching/inflammation. 09/09/21     VITAMIN D, CHOLECALCIFEROL, PO Take 1 capsule by mouth daily.    [provider]      Allergies    Codeine, Hydrochlorothiazide, Hydrocodone-acetaminophen, and Hydromorphone  Review of Systems   Review of Systems  Constitutional:        PICC line issue  All other systems reviewed and are negative.   Physical Exam Updated Vital Signs BP (!) 151/95 (BP Location: Left Arm)   Pulse (!) 57   Temp 98.3 F (36.8 C)   Resp 17   Ht 6' (1.829 m)   Wt 98.1 kg   SpO2 96%   BMI 29.33 kg/m   Physical Exam Vitals and nursing note reviewed.  Constitutional:      Appearance: He is well-developed.  HENT:     Head: Normocephalic and atraumatic.  Eyes:     Conjunctiva/sclera: Conjunctivae normal.     Pupils: Pupils are equal, round, and reactive to light.  Cardiovascular:     Rate and Rhythm:  Normal rate and regular rhythm.     Heart sounds: Normal heart sounds.  Pulmonary:     Effort: Pulmonary effort is normal.     Breath sounds: Normal breath sounds.  Abdominal:     General: Bowel sounds are normal.     Palpations: Abdomen is soft.  Musculoskeletal:        General: Normal range of motion.     Cervical back: Normal range of motion.     Comments: PICC line RUE, bandaged and clean without redness/erythema  Skin:    General: Skin is warm and dry.  Neurological:     Mental Status: He is alert and oriented to person, place, and time.     ED Results / Procedures / Treatments   Labs (all labs ordered are listed, but only abnormal results are displayed) Labs Reviewed - No data to display  EKG None  Radiology No results found.  Procedures Procedures    Medications Ordered in ED Medications - No data to display  ED Course/ Medical Decision Making/ A&P                                 Medical Decision Making   67 year old male presenting to the ED with issues with his PICC line.  Recent admission for MSSA bacteremia, currently on Ancef via home PICC line.  Home health nurse had some issues with this tonight so sent in for eval.  Patient clinically appears well, states he feels fine aside from his malfunctioning PICC line.  His vitals are stable.  IV team has assessed line, now flushing normally.  Patient has no additional complaints.  Stable for discharge.  Will have him continue home IV ancef.  Can return here for any new or acute changes.  Final Clinical Impression(s) / ED Diagnoses Final diagnoses:  Occluded PICC line, initial encounter [facility])    Rx / DC Orders ED Discharge Orders     None         Garlon Hatchet, PA-C 08/14/23 0617    [name], MD 08/14/23 [phone]

## 2023-08-14 NOTE — ED Notes (Signed)
Pt is in triage room 2B.

## 2023-08-14 NOTE — Discharge Instructions (Signed)
Continue antibiotics as scheduled. Follow-up with your doctor. Return here for new concerns.

## 2023-08-15 DIAGNOSIS — Z792 Long term (current) use of antibiotics: Secondary | ICD-10-CM | POA: Diagnosis not present

## 2023-08-16 LAB — LAB REPORT - SCANNED
Calcium: 9.2
EGFR: 43

## 2023-08-16 NOTE — Telephone Encounter (Signed)
LVM for pt explaining that the Jari Favre, [place] appt has been canceled and Dr. Odis Hollingshead is okay with the pt waiting for the 1/13 f/u appt with Robin Searing, NP. Pt told to call our office or send [place] a message on MyChart with any questions.

## 2023-08-17 DIAGNOSIS — R7881 Bacteremia: Secondary | ICD-10-CM | POA: Diagnosis not present

## 2023-08-17 DIAGNOSIS — I509 Heart failure, unspecified: Secondary | ICD-10-CM | POA: Diagnosis not present

## 2023-08-18 DIAGNOSIS — I2510 Atherosclerotic heart disease of native coronary artery without angina pectoris: Secondary | ICD-10-CM | POA: Diagnosis not present

## 2023-08-18 DIAGNOSIS — B348 Other viral infections of unspecified site: Secondary | ICD-10-CM | POA: Diagnosis not present

## 2023-08-18 DIAGNOSIS — J9601 Acute respiratory failure with hypoxia: Secondary | ICD-10-CM | POA: Diagnosis not present

## 2023-08-18 DIAGNOSIS — J449 Chronic obstructive pulmonary disease, unspecified: Secondary | ICD-10-CM | POA: Diagnosis not present

## 2023-08-19 DIAGNOSIS — I059 Rheumatic mitral valve disease, unspecified: Secondary | ICD-10-CM | POA: Diagnosis not present

## 2023-08-19 DIAGNOSIS — R7881 Bacteremia: Secondary | ICD-10-CM | POA: Diagnosis not present

## 2023-08-22 DIAGNOSIS — J15211 Pneumonia due to Methicillin susceptible Staphylococcus aureus: Secondary | ICD-10-CM | POA: Diagnosis not present

## 2023-08-22 DIAGNOSIS — R7881 Bacteremia: Secondary | ICD-10-CM | POA: Diagnosis not present

## 2023-08-23 LAB — LAB REPORT - SCANNED
Calcium: 9.1
EGFR: 42

## 2023-08-25 DIAGNOSIS — D869 Sarcoidosis, unspecified: Secondary | ICD-10-CM | POA: Diagnosis not present

## 2023-08-25 DIAGNOSIS — J449 Chronic obstructive pulmonary disease, unspecified: Secondary | ICD-10-CM | POA: Diagnosis not present

## 2023-08-25 DIAGNOSIS — I2510 Atherosclerotic heart disease of native coronary artery without angina pectoris: Secondary | ICD-10-CM | POA: Diagnosis not present

## 2023-08-25 DIAGNOSIS — I700 Atherosclerosis of aorta: Secondary | ICD-10-CM | POA: Diagnosis not present

## 2023-08-25 DIAGNOSIS — I130 Hypertensive heart and chronic kidney disease with heart failure and stage 1 through stage 4 chronic kidney disease, or unspecified chronic kidney disease: Secondary | ICD-10-CM | POA: Diagnosis not present

## 2023-08-25 DIAGNOSIS — Z86711 Personal history of pulmonary embolism: Secondary | ICD-10-CM | POA: Diagnosis not present

## 2023-08-25 DIAGNOSIS — Z452 Encounter for adjustment and management of vascular access device: Secondary | ICD-10-CM | POA: Diagnosis not present

## 2023-08-25 DIAGNOSIS — I7121 Aneurysm of the ascending aorta, without rupture: Secondary | ICD-10-CM | POA: Diagnosis not present

## 2023-08-25 DIAGNOSIS — I330 Acute and subacute infective endocarditis: Secondary | ICD-10-CM | POA: Diagnosis not present

## 2023-08-25 DIAGNOSIS — I5032 Chronic diastolic (congestive) heart failure: Secondary | ICD-10-CM | POA: Diagnosis not present

## 2023-08-25 DIAGNOSIS — A4901 Methicillin susceptible Staphylococcus aureus infection, unspecified site: Secondary | ICD-10-CM | POA: Diagnosis not present

## 2023-08-25 DIAGNOSIS — Z79899 Other long term (current) drug therapy: Secondary | ICD-10-CM | POA: Diagnosis not present

## 2023-08-26 DIAGNOSIS — I059 Rheumatic mitral valve disease, unspecified: Secondary | ICD-10-CM | POA: Diagnosis not present

## 2023-08-26 DIAGNOSIS — R7881 Bacteremia: Secondary | ICD-10-CM | POA: Diagnosis not present

## 2023-08-29 DIAGNOSIS — Z792 Long term (current) use of antibiotics: Secondary | ICD-10-CM | POA: Diagnosis not present

## 2023-08-30 LAB — LAB REPORT - SCANNED: EGFR: 42

## 2023-09-02 DIAGNOSIS — R7881 Bacteremia: Secondary | ICD-10-CM | POA: Diagnosis not present

## 2023-09-02 DIAGNOSIS — I059 Rheumatic mitral valve disease, unspecified: Secondary | ICD-10-CM | POA: Diagnosis not present

## 2023-09-05 DIAGNOSIS — Z792 Long term (current) use of antibiotics: Secondary | ICD-10-CM | POA: Diagnosis not present

## 2023-09-08 ENCOUNTER — Other Ambulatory Visit: Payer: Self-pay

## 2023-09-08 ENCOUNTER — Ambulatory Visit (INDEPENDENT_AMBULATORY_CARE_PROVIDER_SITE_OTHER): Payer: PPO | Admitting: Internal Medicine

## 2023-09-08 ENCOUNTER — Encounter: Payer: Self-pay | Admitting: Internal Medicine

## 2023-09-08 ENCOUNTER — Telehealth: Payer: Self-pay

## 2023-09-08 VITALS — BP 134/88 | HR 62 | Temp 98.0°F | Wt 218.0 lb

## 2023-09-08 DIAGNOSIS — I059 Rheumatic mitral valve disease, unspecified: Secondary | ICD-10-CM

## 2023-09-08 DIAGNOSIS — R7881 Bacteremia: Secondary | ICD-10-CM | POA: Diagnosis not present

## 2023-09-08 DIAGNOSIS — B9561 Methicillin susceptible Staphylococcus aureus infection as the cause of diseases classified elsewhere: Secondary | ICD-10-CM

## 2023-09-08 NOTE — Telephone Encounter (Signed)
 Per Dr. Luciana Axe reached out to [employer] with pull picc orders. Picc can be removed after last dose on 1/11. Orders confirmed by Lars Mage C. Juanita Laster, RMA

## 2023-09-08 NOTE — Assessment & Plan Note (Signed)
 He is doing well at this time and no particular concerns on his labs in regards to medication side effects including a normal white blood cell count his creatinine has been stable.  Will continue with the plan of antibiotics through January 11 and then will stop and he can have his PICC line pulled at that time.  He otherwise can follow-up here as needed. 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    I have personally spent 35 minutes involved in face-to-face and non-face-to-face activities for this patient on the day of the visit. Professional time spent includes the following activities: Preparing to see the patient (review of tests), Obtaining and/or reviewing separately obtained history (admission/discharge record), Performing a medically appropriate examination and/or evaluation , Ordering medications/tests/procedures, referring and communicating with other health care professionals, Documenting clinical information in the EMR, Independently interpreting results (not separately reported), Communicating results to the patient/family/caregiver, Counseling and educating the patient/family/caregiver and Care coordination (not separately reported).

## 2023-09-08 NOTE — Progress Notes (Signed)
   Subjective:    Patient ID: [name], male    DOB: [birth year], 68 y.o.   MRN: [id number]  HPI [name] is here for follow up of his recent hospitalization.  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  He was started on cefazolin  and has completed over 4 weeks of treatment with stop date of January 11.  He feels well, no fever or chills, no associated rash or diarrhea.  He is increasing his activity.  He has no complaints today.    Review of Systems  Constitutional:  Negative for chills, fatigue and fever.  Gastrointestinal:  Negative for diarrhea.  Skin:  Negative for rash.       Objective:   Physical Exam Eyes:     General: No scleral icterus. Pulmonary:     Effort: Pulmonary effort is normal.  Neurological:     Mental Status: He is alert.           Assessment & Plan:

## 2023-09-09 ENCOUNTER — Ambulatory Visit: Payer: PPO | Admitting: Physician Assistant

## 2023-09-09 DIAGNOSIS — L820 Inflamed seborrheic keratosis: Secondary | ICD-10-CM | POA: Diagnosis not present

## 2023-09-09 DIAGNOSIS — I059 Rheumatic mitral valve disease, unspecified: Secondary | ICD-10-CM | POA: Diagnosis not present

## 2023-09-09 DIAGNOSIS — R7881 Bacteremia: Secondary | ICD-10-CM | POA: Diagnosis not present

## 2023-09-09 DIAGNOSIS — Z85828 Personal history of other malignant neoplasm of skin: Secondary | ICD-10-CM | POA: Diagnosis not present

## 2023-09-12 DIAGNOSIS — Z792 Long term (current) use of antibiotics: Secondary | ICD-10-CM | POA: Diagnosis not present

## 2023-09-12 DIAGNOSIS — I059 Rheumatic mitral valve disease, unspecified: Secondary | ICD-10-CM | POA: Diagnosis not present

## 2023-09-12 DIAGNOSIS — R7881 Bacteremia: Secondary | ICD-10-CM | POA: Diagnosis not present

## 2023-09-13 LAB — LAB REPORT - SCANNED: EGFR: 52

## 2023-09-16 DIAGNOSIS — I059 Rheumatic mitral valve disease, unspecified: Secondary | ICD-10-CM | POA: Diagnosis not present

## 2023-09-16 DIAGNOSIS — R7881 Bacteremia: Secondary | ICD-10-CM | POA: Diagnosis not present

## 2023-09-18 DIAGNOSIS — B348 Other viral infections of unspecified site: Secondary | ICD-10-CM | POA: Diagnosis not present

## 2023-09-18 DIAGNOSIS — J449 Chronic obstructive pulmonary disease, unspecified: Secondary | ICD-10-CM | POA: Diagnosis not present

## 2023-09-18 DIAGNOSIS — J9601 Acute respiratory failure with hypoxia: Secondary | ICD-10-CM | POA: Diagnosis not present

## 2023-09-18 DIAGNOSIS — I2510 Atherosclerotic heart disease of native coronary artery without angina pectoris: Secondary | ICD-10-CM | POA: Diagnosis not present

## 2023-09-18 NOTE — Progress Notes (Signed)
 Cardiology Office Note    Patient Name: SQUARE ENSINGER Date of Encounter: 09/19/2023  Primary Care Provider:  Creola Corn, MD Primary Cardiologist:  Tessa Lerner, DO Primary Electrophysiologist: None   Past Medical History    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   ED (erectile dysfunction)    GERD (gastroesophageal reflux disease)    Hypercalcemia    Hyperlipidemia    Hypertension    Insomnia    Long-term use of high-risk medication    Nephrocalcinosis    Nephrolithiasis    Obesity    Osteopenia    Pancreatitis    admitted 03/19/01-06/05/01   PE (pulmonary embolism) 01/30/2013   Proteinuria    Pulmonary embolism (HCC) 01/30/2013   Sarcoidosis    Sleep apnea    Smoker    Stroke [facility])     History of Present Illness  YURIY BROSEY is a 68 y.o. male with a PMH of nonobstructive CAD per [facility] in 2022, previous cardiac arrest in 2002, aortic atherosclerosis HFpEF, sarcoidosis, COPD, HTN, HLD, CVA, OSA (not on CPAP), history of PE, CKD stage IIIb, AAA (4.1 cm x 4.2 cm) NSVT, MSSA bacteremia in 2022 who presents today for pre-RHC/LHC workup.  He was admitted 08/05/2023 for complaint of chest pain and shortness of breath.  He completed a CT of the chest that showed signs of pneumonia and blood cultures were completed and shown to be positive for MSSA bacteremia and patient was started on IV antibiotics.  He completed MRI of the thoracic and lumbar spine and was negative for acute infection.  During his admission a TEE was completed that showed a 0.5 cm x  0.8 cm mobile mass in the RV attached to either chordae of the wall of the RV with a small fibrinous mobile mass on the posterior leaflet of the MV.  He had normal biventricular function with no other significant valve abnormalities.  He was seen by Dr. Odis Hollingshead during admission and was doing well from a CHF standpoint but was noted to have AKI with rising creatinine with most recent troponins negative.  He was also evaluated by Dr. Leafy Ro for potential surgical intervention.  Recommendations were made to continue antibiotics per ID with completion of 6 weeks of antibiotics and reevaluation by general cardiology.  He was discharged with a PICC line in place on 08/12/2023 and 6 weeks of antibiotic therapy.  He was seen in follow-up by infectious disease on 09/08/2023 and reported feeling well with no complaints of fever, chills and was tolerating increased activity.  Patient will need to have repeat TEE to evaluate for endocarditis valvular vegetation.   Mr. Greg presents today for posthospital follow-up and to arrange TEE.  He reports no fever or chills since his hospital discharge.  He completed all of his antibiotics as prescribed.  His blood pressure today however was elevated at 150/86 and was 148/100 on recheck.  The patient has been monitoring his blood pressure at home and reports readings in the 150s to 160s systolic and 90s diastolic.  He expresses concern about these readings and inquires about potential adjustments to his medication regimen.we also discussed the importance of abstaining from excess salt in his diet. Patient denies chest pain, palpitations, dyspnea, PND, orthopnea, nausea, vomiting, dizziness, syncope, edema, weight gain, or early satiety.   Review of Systems  Please see the history of present illness.    All other systems reviewed and are otherwise negative except as noted above.  Physical Exam    Wt Readings from Last 3 Encounters:  09/19/23 219 lb (99.3 kg)  09/08/23 218 lb  (98.9 kg)  08/14/23 216 lb 4.3 oz (98.1 kg)   VS: Vitals:   09/19/23 1353 09/19/23 1458  BP: (!) 150/86 (!) 148/100  Pulse: 86   SpO2: 95%   ,Body mass index is 29.7 kg/m. GEN: Well nourished, well developed in no acute distress Neck: No JVD; No carotid bruits Pulmonary: Clear to auscultation without rales, wheezing or rhonchi  Cardiovascular: Normal rate. Regular rhythm. Normal S1. Normal S2.   Murmurs: There is no murmur.  ABDOMEN: Soft, non-tender, non-distended EXTREMITIES:  No edema; No deformity   EKG/LABS/ Recent Cardiac Studies   ECG personally reviewed by me today -sinus rhythm with possible LAE and left anterior fascicular block with rate of 86 bpm and no acute changes with abnormal qt of 483  Risk Assessment/Calculations:          Lab Results  Component Value Date   WBC 6.3 08/09/2023   HGB 14.3 08/09/2023   HCT 42.2 08/09/2023   MCV 91.3 08/09/2023   PLT 212 08/09/2023   Lab Results  Component Value Date   CREATININE 1.84 (H) 08/11/2023   BUN 12 08/11/2023   NA 138 08/11/2023   K 3.7 08/11/2023   CL 104 08/11/2023   CO2 27 08/11/2023   Lab Results  Component Value Date   CHOL 115 08/11/2023   HDL 35 (L) 08/11/2023   LDLCALC 58 08/11/2023   TRIG 111 08/11/2023   CHOLHDL 3.3 08/11/2023    Lab Results  Component Value Date   HGBA1C 5.7 (H) 04/08/2021   Assessment & Plan    1.MSSA bacteremia secondary to CAP: -Blood cultures positive and underwent TEE with vegetation seen on MV and RV with antibiotic therapy completed reevaluation with TEE recommended -Today patient reports no fevers or chills and has completed all antibiotic course per hospital. -We will check CBC and BMET today  2.  Chronic HFpEF: -TEE completed showing preserved EF and today patient is NYHA class II -He is euvolemic on examination today. -Continue current GDMT with hydralazine 100 mg 3 times daily, Imdur 30 mg daily, torsemide 20 mg as needed -Low sodium diet, fluid  restriction <2L, and daily weights encouraged. Educated to contact our office for weight gain of 2 lbs overnight or 5 lbs in one week.   3.  Nonobstructive CAD: -Previous LHC/RHC completed 2022 showing nonobstructive CAD 70% stenosis of RCA with 3% stenosis of LAD and 30% circumflex stenosis treated medically. -Today patient reports no chest pain or angina. -Continue current GDMT with Lipitor 20 mg daily, ASA 81 mg, Toprol-XL 200 mg  4.  Essential hypertension: -HYPERTENSION CONTROL Vitals:   09/19/23 1353 09/19/23 1458  BP: (!) 150/86 (!) 148/100    The patient's blood pressure is elevated above target today.  In order to address the patient's elevated BP: Blood pressure will be monitored at home to determine if medication changes need to be made.; A current anti-hypertensive medication was adjusted today.    -  Patient will increase hydralazine to 100 mg 3 times daily -He will monitor blood pressures and return readings back to our office in 2 weeks -He was instructed to reduce his sodium intake and will focus on a Dash type diet.  5.  Prolonged QTc: -EKG completed today with QTc of 483 -Patient will continue to abstain from QT prolonging medications  Disposition: Follow-up with Tessa Lerner, DO or APP in 3 months Informed Consent   Shared Decision Making/Informed Consent   The risks [esophageal damage, perforation (1:10,000 risk), bleeding, pharyngeal hematoma as well as other potential complications associated with conscious sedation including aspiration, arrhythmia, respiratory failure and death], benefits (treatment guidance and diagnostic support) and alternatives of a transesophageal echocardiogram were discussed in detail with Mr. Fornoff and he is willing to proceed.       Signed, Napoleon Form, Leodis Rains, NP 09/19/2023, 4:04 PM [facility]

## 2023-09-18 NOTE — H&P (View-Only) (Signed)
Cardiology Office Note    Patient Name: [name] Date of Encounter: 09/19/2023  Primary Care Provider:  Creola Corn, MD Primary Cardiologist:  Tessa Lerner, DO Primary Electrophysiologist: None   Past Medical History    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   ED (erectile dysfunction)    GERD (gastroesophageal reflux disease)    Hypercalcemia    Hyperlipidemia    Hypertension    Insomnia    Long-term use of high-risk medication    Nephrocalcinosis    Nephrolithiasis    Obesity    Osteopenia    Pancreatitis    admitted 03/19/01-06/05/01   PE (pulmonary embolism) 01/30/2013   Proteinuria    Pulmonary embolism (HCC) 01/30/2013   Sarcoidosis    Sleep apnea    Smoker    Stroke [facility])     History of Present Illness  [name] is a 68 y.o. male with a PMH of nonobstructive CAD per [facility] in 2022, previous cardiac arrest in 2002, aortic atherosclerosis HFpEF, sarcoidosis, COPD, HTN, HLD, CVA, OSA (not on CPAP), history of PE, CKD stage IIIb, AAA (4.1 cm x 4.2 cm) NSVT, MSSA bacteremia in 2022 who presents today for pre-RHC/LHC workup.  He was admitted 08/05/2023 for complaint of chest pain and shortness of breath.  He completed a CT of the chest that showed signs of pneumonia and blood cultures were completed and shown to be positive for MSSA bacteremia and patient was started on IV antibiotics.  He completed MRI of the thoracic and lumbar spine and was negative for acute infection.  During his admission a TEE was completed that showed a 0.5 cm x  0.8 cm mobile mass in the RV attached to either chordae of the wall of the RV with a small fibrinous mobile mass on the posterior leaflet of the MV.  He had normal biventricular function with no other significant valve abnormalities.  He was seen by Dr. Odis Hollingshead during admission and was doing well from a CHF standpoint but was noted to have AKI with rising creatinine with most recent troponins negative.  He was also evaluated by Dr. Leafy Ro for potential surgical intervention.  Recommendations were made to continue antibiotics per ID with completion of 6 weeks of antibiotics and reevaluation by general cardiology.  He was discharged with a PICC line in place on 08/12/2023 and 6 weeks of antibiotic therapy.  He was seen in follow-up by infectious disease on 09/08/2023 and reported feeling well with no complaints of fever, chills and was tolerating increased activity.  Patient will need to have repeat TEE to evaluate for endocarditis valvular vegetation.   [name] presents today for posthospital follow-up and to arrange TEE.  He reports no fever or chills since his hospital discharge.  He completed all of his antibiotics as prescribed.  His blood pressure today however was elevated at 150/86 and was 148/100 on recheck.  The patient has been monitoring his blood pressure at home and reports readings in the 150s to 160s systolic and 90s diastolic.  He expresses concern about these readings and inquires about potential adjustments to his medication regimen.we also discussed the importance of abstaining from excess salt in his diet. Patient denies chest pain, palpitations, dyspnea, PND, orthopnea, nausea, vomiting, dizziness, syncope, edema, weight gain, or early satiety.   Review of Systems  Please see the history of present illness.    All other systems reviewed and are otherwise negative except as noted above.  Physical Exam    Wt Readings from Last 3 Encounters:  09/19/23 219 lb (99.3 kg)  09/08/23 218 lb  (98.9 kg)  08/14/23 216 lb 4.3 oz (98.1 kg)   VS: Vitals:   09/19/23 1353 09/19/23 1458  BP: (!) 150/86 (!) 148/100  Pulse: 86   SpO2: 95%   ,Body mass index is 29.7 kg/m. GEN: Well nourished, well developed in no acute distress Neck: No JVD; No carotid bruits Pulmonary: Clear to auscultation without rales, wheezing or rhonchi  Cardiovascular: Normal rate. Regular rhythm. Normal S1. Normal S2.   Murmurs: There is no murmur.  ABDOMEN: Soft, non-tender, non-distended EXTREMITIES:  No edema; No deformity   EKG/LABS/ Recent Cardiac Studies   ECG personally reviewed by me today -sinus rhythm with possible LAE and left anterior fascicular block with rate of 86 bpm and no acute changes with abnormal qt of 483  Risk Assessment/Calculations:          Lab Results  Component Value Date   WBC 6.3 08/09/2023   HGB 14.3 08/09/2023   HCT 42.2 08/09/2023   MCV 91.3 08/09/2023   PLT 212 08/09/2023   Lab Results  Component Value Date   CREATININE 1.84 (H) 08/11/2023   BUN 12 08/11/2023   NA 138 08/11/2023   K 3.7 08/11/2023   CL 104 08/11/2023   CO2 27 08/11/2023   Lab Results  Component Value Date   CHOL 115 08/11/2023   HDL 35 (L) 08/11/2023   LDLCALC 58 08/11/2023   TRIG 111 08/11/2023   CHOLHDL 3.3 08/11/2023    Lab Results  Component Value Date   HGBA1C 5.7 (H) 04/08/2021   Assessment & Plan    1.MSSA bacteremia secondary to CAP: -Blood cultures positive and underwent TEE with vegetation seen on MV and RV with antibiotic therapy completed reevaluation with TEE recommended -Today patient reports no fevers or chills and has completed all antibiotic course per hospital. -We will check CBC and BMET today  2.  Chronic HFpEF: -TEE completed showing preserved EF and today patient is NYHA class II -He is euvolemic on examination today. -Continue current GDMT with hydralazine 100 mg 3 times daily, Imdur 30 mg daily, torsemide 20 mg as needed -Low sodium diet, fluid  restriction <2L, and daily weights encouraged. Educated to contact our office for weight gain of 2 lbs overnight or 5 lbs in one week.   3.  Nonobstructive CAD: -Previous LHC/RHC completed 2022 showing nonobstructive CAD 70% stenosis of RCA with 3% stenosis of LAD and 30% circumflex stenosis treated medically. -Today patient reports no chest pain or angina. -Continue current GDMT with Lipitor 20 mg daily, ASA 81 mg, Toprol-XL 200 mg  4.  Essential hypertension: -HYPERTENSION CONTROL Vitals:   09/19/23 1353 09/19/23 1458  BP: (!) 150/86 (!) 148/100    The patient's blood pressure is elevated above target today.  In order to address the patient's elevated BP: Blood pressure will be monitored at home to determine if medication changes need to be made.; A current anti-hypertensive medication was adjusted today.    -  Patient will increase hydralazine to 100 mg 3 times daily -He will monitor blood pressures and return readings back to our office in 2 weeks -He was instructed to reduce his sodium intake and will focus on a Dash type diet.  5.  Prolonged QTc: -EKG completed today with QTc of 483 -Patient will continue to abstain from QT prolonging medications  Disposition: Follow-up with Tessa Lerner, DO or APP in 3 months Informed Consent   Shared Decision Making/Informed Consent   The risks [esophageal damage, perforation (1:10,000 risk), bleeding, pharyngeal hematoma as well as other potential complications associated with conscious sedation including aspiration, arrhythmia, respiratory failure and death], benefits (treatment guidance and diagnostic support) and alternatives of a transesophageal echocardiogram were discussed in detail with [name] and he is willing to proceed.       Signed, Napoleon Form, Leodis Rains, NP 09/19/2023, 4:04 PM [facility]

## 2023-09-19 ENCOUNTER — Encounter: Payer: Self-pay | Admitting: Nurse Practitioner

## 2023-09-19 ENCOUNTER — Other Ambulatory Visit (HOSPITAL_COMMUNITY): Payer: Self-pay

## 2023-09-19 ENCOUNTER — Encounter: Payer: Self-pay | Admitting: Internal Medicine

## 2023-09-19 ENCOUNTER — Ambulatory Visit: Payer: PPO | Attending: Nurse Practitioner | Admitting: Nurse Practitioner

## 2023-09-19 VITALS — BP 148/100 | HR 86 | Ht 72.0 in | Wt 219.0 lb

## 2023-09-19 DIAGNOSIS — I5032 Chronic diastolic (congestive) heart failure: Secondary | ICD-10-CM

## 2023-09-19 DIAGNOSIS — B9561 Methicillin susceptible Staphylococcus aureus infection as the cause of diseases classified elsewhere: Secondary | ICD-10-CM

## 2023-09-19 DIAGNOSIS — I2510 Atherosclerotic heart disease of native coronary artery without angina pectoris: Secondary | ICD-10-CM | POA: Diagnosis not present

## 2023-09-19 DIAGNOSIS — Z86711 Personal history of pulmonary embolism: Secondary | ICD-10-CM

## 2023-09-19 DIAGNOSIS — Z01812 Encounter for preprocedural laboratory examination: Secondary | ICD-10-CM

## 2023-09-19 DIAGNOSIS — I10 Essential (primary) hypertension: Secondary | ICD-10-CM | POA: Diagnosis not present

## 2023-09-19 DIAGNOSIS — R7881 Bacteremia: Secondary | ICD-10-CM | POA: Diagnosis not present

## 2023-09-19 MED ORDER — HYDRALAZINE HCL 100 MG PO TABS
100.0000 mg | ORAL_TABLET | Freq: Three times a day (TID) | ORAL | 0 refills | Status: DC
  Filled 2023-09-19 – 2023-10-19 (×3): qty 270, 90d supply, fill #0

## 2023-09-19 NOTE — Patient Instructions (Addendum)
 Medication Instructions:  Your physician has recommended you make the following change in your medication:  Please increase hydralazine  to 3 times daily.   Please check Blood pressure and keep a log for review.  Lab Work: CBC BMET  If you have labs (blood work) drawn today and your tests are completely normal, you will receive your results only by: [employer] (if you have MyChart) OR A paper copy in the mail If you have any lab test that is abnormal or we need to change your treatment, we will call you to review the results.  Testing/Procedures: TEE  Follow-Up: At [facility], you and your health needs are our priority.  As part of our continuing mission to provide you with exceptional heart care, we have created designated Provider Care Teams.  These Care Teams include your primary Cardiologist (physician) and Advanced Practice Providers (APPs -  Physician Assistants and Nurse Practitioners) who all work together to provide you with the care you need, when you need it.   Your next appointment:   3 months  The format for your next appointment:   In Person  Provider:   Dr Leeroy one of the following Advanced Practice Providers on your designated Care Team:   Jackee Alberts, NP       Dear [name]  You are scheduled for a TEE (Transesophageal Echocardiogram) on Wednesday, January 15 with Dr. Michele.  Please arrive at the [facility] (Main Entrance A) at [facility]: [address] [place], [place] [address] at 11:30 AM (This time is 1 hour(s) before your procedure to ensure your preparation).   Free valet parking service is available. You will check in at ADMITTING.   *Please Note: You will receive a call the day before your procedure to confirm the appointment time. That time may have changed from the original time based on the schedule for that day.*   DIET:  Nothing to eat or drink after midnight except a sip of water with medications (see medication  instructions below)  MEDICATION INSTRUCTIONS: !!IF ANY NEW MEDICATIONS ARE STARTED AFTER TODAY, PLEASE NOTIFY YOUR PROVIDER AS SOON AS POSSIBLE!!  FYI: Medications such as Semaglutide (Ozempic, Wegovy), Tirzepatide (Mounjaro, Zepbound), Dulaglutide (Trulicity), etc (GLP1 agonists) AND Canagliflozin (Invokana), Dapagliflozin  (Farxiga ), Empagliflozin  (Jardiance ), Ertugliflozin (Steglatro), Bexagliflozin [employer]) or any combination with one of these drugs such as Invokamet (Canagliflozin/Metformin), Synjardy (Empagliflozin /Metformin), etc (SGLT2 inhibitors) must be held around the time of a procedure. This is not a comprehensive list of all of these drugs. Please review all of your medications and talk to your provider if you take any one of these. If you are not sure, ask your provider.    LABS: Today- CBC BMET  FYI:  For your safety, and to allow us  to monitor your vital signs accurately during the surgery/procedure we request: If you have artificial nails, gel coating, SNS etc, please have those removed prior to your surgery/procedure. Not having the nail coverings /polish removed may result in cancellation or delay of your surgery/procedure.  Your support person will be asked to wait in the waiting room during your procedure.  It is OK to have someone drop you off and come back when you are ready to be discharged.  You cannot drive after the procedure and will need someone to drive you home.  Bring your insurance cards.  *Special Note: Every effort is made to have your procedure done on time. Occasionally there are emergencies that occur at the hospital that  may cause delays. Please be patient if a delay does occur.

## 2023-09-20 ENCOUNTER — Telehealth: Payer: Self-pay | Admitting: Cardiology

## 2023-09-20 ENCOUNTER — Other Ambulatory Visit (HOSPITAL_COMMUNITY): Payer: Self-pay

## 2023-09-20 LAB — CBC
Hematocrit: 40.3 % (ref 37.5–51.0)
Hemoglobin: 13.6 g/dL (ref 13.0–17.7)
MCH: 31.4 pg (ref 26.6–33.0)
MCHC: 33.7 g/dL (ref 31.5–35.7)
MCV: 93 fL (ref 79–97)
Platelets: 243 10*3/uL (ref 150–450)
RBC: 4.33 x10E6/uL (ref 4.14–5.80)
RDW: 12.9 % (ref 11.6–15.4)
WBC: 6.8 10*3/uL (ref 3.4–10.8)

## 2023-09-20 LAB — BASIC METABOLIC PANEL
BUN/Creatinine Ratio: 7 — ABNORMAL LOW (ref 10–24)
BUN: 11 mg/dL (ref 8–27)
CO2: 28 mmol/L (ref 20–29)
Calcium: 9.5 mg/dL (ref 8.6–10.2)
Chloride: 104 mmol/L (ref 96–106)
Creatinine, Ser: 1.64 mg/dL — ABNORMAL HIGH (ref 0.76–1.27)
Glucose: 101 mg/dL — ABNORMAL HIGH (ref 70–99)
Potassium: 4.4 mmol/L (ref 3.5–5.2)
Sodium: 145 mmol/L — ABNORMAL HIGH (ref 134–144)
eGFR: 46 mL/min/{1.73_m2} — ABNORMAL LOW (ref >59)

## 2023-09-20 NOTE — Telephone Encounter (Signed)
 Pt states he does not have someone to stay w/ him and drive him home after procedure. Spoke to [employer] at hospital and cancelled TEE.  Aware RN will call him  to reschedule this.  Patient verbalized understanding and agreeable to plan.

## 2023-09-20 NOTE — Telephone Encounter (Signed)
 Patient stated he will not have someone to bring him to his procedure tomorrow and want to have the procedure rescheduled.

## 2023-09-21 ENCOUNTER — Ambulatory Visit (HOSPITAL_COMMUNITY): Admit: 2023-09-21 | Payer: PPO | Admitting: Cardiology

## 2023-09-21 ENCOUNTER — Encounter (HOSPITAL_COMMUNITY): Payer: Self-pay

## 2023-09-21 SURGERY — TRANSESOPHAGEAL ECHOCARDIOGRAM (TEE) (CATHLAB)
Anesthesia: Monitor Anesthesia Care

## 2023-09-21 NOTE — Telephone Encounter (Signed)
 Spoke with pt over the phone and was able to reschedule his TEE for 10/05/23 with Dr. Renna Cary at 11:45 AM (pt told to be at the hospital by 10:30 AM). Explained to pt that the TEE instructions in his AVS from the 09/19/23 OV with Charles Connor, NP are still the same for the rescheduled TEE and the only difference would be the new date and new time he needed to arrive. Pt verbalized understanding and stated he would call our office if he had any questions.

## 2023-09-26 DIAGNOSIS — H53483 Generalized contraction of visual field, bilateral: Secondary | ICD-10-CM | POA: Diagnosis not present

## 2023-09-26 NOTE — Progress Notes (Signed)
 Spoke to patient and instructed them to come at 0830  and to be NPO after 0000.  Medications reviewed.    Confirmed that patient will have a ride home and someone to stay with them for 24 hours after the procedure.

## 2023-09-27 ENCOUNTER — Ambulatory Visit (HOSPITAL_COMMUNITY)
Admission: RE | Admit: 2023-09-27 | Discharge: 2023-09-27 | Disposition: A | Discharge status: Home / Self Care | Payer: PPO | Attending: Cardiology | Admitting: Cardiology

## 2023-09-27 ENCOUNTER — Encounter (HOSPITAL_COMMUNITY)
Admission: RE | Disposition: A | Discharge status: Home / Self Care | Payer: Self-pay | Source: Home / Self Care | Attending: Cardiology

## 2023-09-27 ENCOUNTER — Ambulatory Visit (HOSPITAL_COMMUNITY): Payer: Self-pay | Admitting: Anesthesiology

## 2023-09-27 ENCOUNTER — Other Ambulatory Visit: Payer: Self-pay

## 2023-09-27 ENCOUNTER — Ambulatory Visit (HOSPITAL_COMMUNITY)
Admission: RE | Admit: 2023-09-27 | Discharge: 2023-09-27 | Disposition: A | Discharge status: Home / Self Care | Payer: PPO | Source: Ambulatory Visit | Attending: Nurse Practitioner | Admitting: Nurse Practitioner

## 2023-09-27 ENCOUNTER — Ambulatory Visit (HOSPITAL_BASED_OUTPATIENT_CLINIC_OR_DEPARTMENT_OTHER): Payer: PPO | Admitting: Anesthesiology

## 2023-09-27 DIAGNOSIS — R7881 Bacteremia: Secondary | ICD-10-CM | POA: Insufficient documentation

## 2023-09-27 DIAGNOSIS — Z79899 Other long term (current) drug therapy: Secondary | ICD-10-CM | POA: Diagnosis not present

## 2023-09-27 DIAGNOSIS — I2510 Atherosclerotic heart disease of native coronary artery without angina pectoris: Secondary | ICD-10-CM | POA: Diagnosis not present

## 2023-09-27 DIAGNOSIS — B9561 Methicillin susceptible Staphylococcus aureus infection as the cause of diseases classified elsewhere: Secondary | ICD-10-CM

## 2023-09-27 DIAGNOSIS — Z87891 Personal history of nicotine dependence: Secondary | ICD-10-CM | POA: Diagnosis not present

## 2023-09-27 DIAGNOSIS — I130 Hypertensive heart and chronic kidney disease with heart failure and stage 1 through stage 4 chronic kidney disease, or unspecified chronic kidney disease: Secondary | ICD-10-CM

## 2023-09-27 DIAGNOSIS — Z86711 Personal history of pulmonary embolism: Secondary | ICD-10-CM | POA: Diagnosis not present

## 2023-09-27 DIAGNOSIS — I071 Rheumatic tricuspid insufficiency: Secondary | ICD-10-CM | POA: Insufficient documentation

## 2023-09-27 DIAGNOSIS — I110 Hypertensive heart disease with heart failure: Secondary | ICD-10-CM | POA: Diagnosis not present

## 2023-09-27 DIAGNOSIS — I082 Rheumatic disorders of both aortic and tricuspid valves: Secondary | ICD-10-CM

## 2023-09-27 DIAGNOSIS — N1832 Chronic kidney disease, stage 3b: Secondary | ICD-10-CM | POA: Insufficient documentation

## 2023-09-27 DIAGNOSIS — I5032 Chronic diastolic (congestive) heart failure: Secondary | ICD-10-CM | POA: Diagnosis not present

## 2023-09-27 DIAGNOSIS — I5042 Chronic combined systolic (congestive) and diastolic (congestive) heart failure: Secondary | ICD-10-CM | POA: Diagnosis not present

## 2023-09-27 DIAGNOSIS — I340 Nonrheumatic mitral (valve) insufficiency: Secondary | ICD-10-CM | POA: Diagnosis not present

## 2023-09-27 HISTORY — PX: TRANSESOPHAGEAL ECHOCARDIOGRAM (CATH LAB): EP1270

## 2023-09-27 LAB — ECHO TEE

## 2023-09-27 SURGERY — TRANSESOPHAGEAL ECHOCARDIOGRAM (TEE) (CATHLAB)
Anesthesia: Monitor Anesthesia Care

## 2023-09-27 MED ORDER — PROPOFOL 500 MG/50ML IV EMUL
INTRAVENOUS | Status: DC | PRN
  Administered 2023-09-27: 100 ug/kg/min via INTRAVENOUS

## 2023-09-27 MED ORDER — SODIUM CHLORIDE 0.9 % IV SOLN
INTRAVENOUS | Status: DC
  Administered 2023-09-27: 20 mL/h via INTRAVENOUS

## 2023-09-27 MED ORDER — SODIUM CHLORIDE 0.9% FLUSH: 10.0000 mL | Freq: Two times a day (BID) | INTRAVENOUS | Status: DC

## 2023-09-27 MED ORDER — LIDOCAINE 2% (20 MG/ML) 5 ML SYRINGE
INTRAMUSCULAR | Status: DC | PRN
  Administered 2023-09-27: 100 mg via INTRAVENOUS

## 2023-09-27 MED ORDER — BUTAMBEN-TETRACAINE-BENZOCAINE 2-2-14 % EX AERO
INHALATION_SPRAY | CUTANEOUS | Status: DC | PRN
  Administered 2023-09-27: 1 via TOPICAL

## 2023-09-27 MED ORDER — PROPOFOL 10 MG/ML IV BOLUS
INTRAVENOUS | Status: DC | PRN
  Administered 2023-09-27: 80 mg via INTRAVENOUS

## 2023-09-27 NOTE — Anesthesia Postprocedure Evaluation (Signed)
Anesthesia Post Note  [name]  Procedure(s) Performed: TRANSESOPHAGEAL ECHOCARDIOGRAM     Patient location during evaluation: Cath Lab Anesthesia Type: MAC Level of consciousness: awake and alert Pain management: pain level controlled Vital Signs Assessment: post-procedure vital signs reviewed and stable Respiratory status: spontaneous breathing, nonlabored ventilation and respiratory function stable Cardiovascular status: stable and blood pressure returned to baseline Postop Assessment: no apparent nausea or vomiting Anesthetic complications: no   No notable events documented.  Last Vitals:  Vitals:   09/27/23 1100 09/27/23 1121  BP: (!) 149/102 (!) 159/129  Pulse: 61 60  Resp: (!) 25 14  Temp:    SpO2: 92% 95%    Last Pain:  Vitals:   09/27/23 1059  TempSrc: Temporal  PainSc: 0-No pain                 Aijalon Demuro

## 2023-09-27 NOTE — CV Procedure (Signed)
   Transesophageal Echocardiogram  Indications: Bacteremia, prior vegetation  Time out performed  During this procedure the patient was administered propofol under anesthesiology supervision to achieve and maintain moderate sedation.  The patient's heart rate, blood pressure, and oxygen saturation are monitored continuously during the procedure.   Findings:  Left Ventricle: Normal ejection fraction 60%  Mitral Valve: Normal appearing mitral valve.  There is no obvious mitral valve vegetation identified.  When compared to prior study, this seems to be cleared.  Aortic Valve: Trileaflet, trivial aortic regurgitation  Tricuspid Valve: Mild to moderate tricuspid valve regurgitation  Left Atrium: Normal, no left atrial appendage thrombus  Right Atrium: Normal  Intraatrial septum: Normal no shunt by color-flow  Impressions: No further evidence of valvular vegetation present on the mitral valve.  There is no evidence of mobile mass in the right ventricle as previously described.  Reassuring echocardiogram.  Donato Schultz, MD

## 2023-09-27 NOTE — Interval H&P Note (Signed)
History and Physical Interval Note:  09/27/2023 10:05 AM  [name]  has presented today for surgery, with the diagnosis of MITRAL VALVE REGURGITATION.  The various methods of treatment have been discussed with the patient and family. After consideration of risks, benefits and other options for treatment, the patient has consented to  Procedure(s): TRANSESOPHAGEAL ECHOCARDIOGRAM (N/A) as a surgical intervention.  The patient's history has been reviewed, patient examined, no change in status, stable for surgery.  I have reviewed the patient's chart and labs.  Questions were answered to the patient's satisfaction.     [employer]

## 2023-09-27 NOTE — Anesthesia Preprocedure Evaluation (Signed)
Anesthesia Evaluation  Patient identified by MRN, date of birth, ID band Patient awake    Reviewed: Allergy & Precautions, NPO status , Patient's Chart, lab work & pertinent test results, reviewed documented beta blocker date and time   History of Anesthesia Complications Negative for: history of anesthetic complications  Airway Mallampati: III  TM Distance: >3 FB     Dental  (+) Edentulous Upper, Missing, Partial Lower   Pulmonary neg shortness of breath, asthma , sleep apnea , COPD, neg recent URI, Patient abstained from smoking., former smoker, PE (hx of, previously on [facility]) sarcoidosis   breath sounds clear to auscultation       Cardiovascular hypertension, Pt. on medications and Pt. on home beta blockers + CAD and +CHF   Rhythm:Regular Rate:Normal  C/f bacteremia   Neuro/Psych neg Seizures PSYCHIATRIC DISORDERS       Neuromuscular disease    GI/Hepatic Neg liver ROS,GERD  ,,  Endo/Other  negative endocrine ROS    Renal/GU Renal InsufficiencyRenal diseaseLab Results      Component                Value               Date                      NA                       145 (H)             09/19/2023                K                        4.4                 09/19/2023                CO2                      28                  09/19/2023                GLUCOSE                  101 (H)             09/19/2023                BUN                      11                  09/19/2023                CREATININE               1.64 (H)            09/19/2023                CALCIUM                  9.5                 09/19/2023                EGFR  46 (L)              09/19/2023                GFRNONAA                 40 (L)              08/11/2023                Musculoskeletal  (+) Arthritis ,    Abdominal   Peds  Hematology Lab Results      Component                Value               Date                       WBC                      6.8                 09/19/2023                HGB                      13.6                09/19/2023                HCT                      40.3                09/19/2023                MCV                      93                  09/19/2023                PLT                      243                 09/19/2023               Anesthesia Other Findings   Reproductive/Obstetrics                              Anesthesia Physical Anesthesia Plan  ASA: 3  Anesthesia Plan: MAC   Post-op Pain Management: Minimal or no pain anticipated   Induction:   PONV Risk Score and Plan: 1 and Propofol infusion and Treatment may vary due to age or medical condition  Airway Management Planned: Natural Airway, Simple Face Mask and Nasal Cannula  Additional Equipment: None  Intra-op Plan:   Post-operative Plan:   Informed Consent: I have reviewed the patients History and Physical, chart, labs and discussed the procedure including the risks, benefits and alternatives for the proposed anesthesia with the patient or authorized representative who has indicated his/her understanding and acceptance.     Dental advisory given  Plan Discussed with: CRNA  Anesthesia Plan Comments:          Anesthesia Quick Evaluation

## 2023-09-27 NOTE — Transfer of Care (Signed)
Immediate Anesthesia Transfer of Care Note  [name]  Procedure(s) Performed: TRANSESOPHAGEAL ECHOCARDIOGRAM  Patient Location: Cath Lab  Anesthesia Type:MAC  Level of Consciousness: drowsy and patient cooperative  Airway & Oxygen Therapy: Patient Spontanous Breathing  Post-op Assessment: Report given to RN and Post -op Vital signs reviewed and stable  Post vital signs: Reviewed and stable  Last Vitals:  Vitals Value Taken Time  BP    Temp    Pulse 61 09/27/23 1059  Resp 25 09/27/23 1059  SpO2 92 % 09/27/23 1059  Vitals shown include unfiled device data.  Last Pain:  Vitals:   09/27/23 0835  TempSrc: Temporal         Complications: No notable events documented.

## 2023-09-28 ENCOUNTER — Encounter (HOSPITAL_COMMUNITY): Payer: Self-pay | Admitting: Cardiology

## 2023-10-06 DIAGNOSIS — I130 Hypertensive heart and chronic kidney disease with heart failure and stage 1 through stage 4 chronic kidney disease, or unspecified chronic kidney disease: Secondary | ICD-10-CM | POA: Diagnosis not present

## 2023-10-06 DIAGNOSIS — E669 Obesity, unspecified: Secondary | ICD-10-CM | POA: Diagnosis not present

## 2023-10-06 DIAGNOSIS — J449 Chronic obstructive pulmonary disease, unspecified: Secondary | ICD-10-CM | POA: Diagnosis not present

## 2023-10-06 DIAGNOSIS — N1832 Chronic kidney disease, stage 3b: Secondary | ICD-10-CM | POA: Diagnosis not present

## 2023-10-06 DIAGNOSIS — E785 Hyperlipidemia, unspecified: Secondary | ICD-10-CM | POA: Diagnosis not present

## 2023-10-06 DIAGNOSIS — I5032 Chronic diastolic (congestive) heart failure: Secondary | ICD-10-CM | POA: Diagnosis not present

## 2023-10-06 DIAGNOSIS — D692 Other nonthrombocytopenic purpura: Secondary | ICD-10-CM | POA: Diagnosis not present

## 2023-10-06 DIAGNOSIS — R5383 Other fatigue: Secondary | ICD-10-CM | POA: Diagnosis not present

## 2023-10-06 DIAGNOSIS — G4733 Obstructive sleep apnea (adult) (pediatric): Secondary | ICD-10-CM | POA: Diagnosis not present

## 2023-10-06 DIAGNOSIS — G3184 Mild cognitive impairment, so stated: Secondary | ICD-10-CM | POA: Diagnosis not present

## 2023-10-06 DIAGNOSIS — Z8679 Personal history of other diseases of the circulatory system: Secondary | ICD-10-CM | POA: Diagnosis not present

## 2023-10-06 DIAGNOSIS — I2510 Atherosclerotic heart disease of native coronary artery without angina pectoris: Secondary | ICD-10-CM | POA: Diagnosis not present

## 2023-10-16 ENCOUNTER — Other Ambulatory Visit: Payer: Self-pay

## 2023-10-17 ENCOUNTER — Other Ambulatory Visit (HOSPITAL_COMMUNITY): Payer: Self-pay

## 2023-10-17 ENCOUNTER — Other Ambulatory Visit: Payer: Self-pay

## 2023-10-19 ENCOUNTER — Other Ambulatory Visit: Payer: Self-pay

## 2023-10-19 ENCOUNTER — Other Ambulatory Visit (HOSPITAL_COMMUNITY): Payer: Self-pay

## 2023-10-19 DIAGNOSIS — B348 Other viral infections of unspecified site: Secondary | ICD-10-CM | POA: Diagnosis not present

## 2023-10-19 DIAGNOSIS — J449 Chronic obstructive pulmonary disease, unspecified: Secondary | ICD-10-CM | POA: Diagnosis not present

## 2023-10-19 DIAGNOSIS — J9601 Acute respiratory failure with hypoxia: Secondary | ICD-10-CM | POA: Diagnosis not present

## 2023-10-19 DIAGNOSIS — I2510 Atherosclerotic heart disease of native coronary artery without angina pectoris: Secondary | ICD-10-CM | POA: Diagnosis not present

## 2023-10-20 ENCOUNTER — Other Ambulatory Visit (HOSPITAL_COMMUNITY): Payer: Self-pay

## 2023-10-20 ENCOUNTER — Encounter: Payer: Self-pay | Admitting: Internal Medicine

## 2023-10-21 DIAGNOSIS — I2510 Atherosclerotic heart disease of native coronary artery without angina pectoris: Secondary | ICD-10-CM | POA: Diagnosis not present

## 2023-10-21 DIAGNOSIS — I5042 Chronic combined systolic (congestive) and diastolic (congestive) heart failure: Secondary | ICD-10-CM | POA: Diagnosis not present

## 2023-10-21 DIAGNOSIS — I77810 Thoracic aortic ectasia: Secondary | ICD-10-CM | POA: Diagnosis not present

## 2023-10-21 DIAGNOSIS — J45998 Other asthma: Secondary | ICD-10-CM | POA: Diagnosis not present

## 2023-10-21 DIAGNOSIS — D869 Sarcoidosis, unspecified: Secondary | ICD-10-CM | POA: Diagnosis not present

## 2023-10-21 DIAGNOSIS — N179 Acute kidney failure, unspecified: Secondary | ICD-10-CM | POA: Diagnosis not present

## 2023-10-21 DIAGNOSIS — R7881 Bacteremia: Secondary | ICD-10-CM | POA: Diagnosis not present

## 2023-10-21 DIAGNOSIS — J440 Chronic obstructive pulmonary disease with acute lower respiratory infection: Secondary | ICD-10-CM | POA: Diagnosis not present

## 2023-10-21 DIAGNOSIS — J15211 Pneumonia due to Methicillin susceptible Staphylococcus aureus: Secondary | ICD-10-CM | POA: Diagnosis not present

## 2023-10-21 DIAGNOSIS — N1832 Chronic kidney disease, stage 3b: Secondary | ICD-10-CM | POA: Diagnosis not present

## 2023-10-21 DIAGNOSIS — I739 Peripheral vascular disease, unspecified: Secondary | ICD-10-CM | POA: Diagnosis not present

## 2023-10-21 DIAGNOSIS — I130 Hypertensive heart and chronic kidney disease with heart failure and stage 1 through stage 4 chronic kidney disease, or unspecified chronic kidney disease: Secondary | ICD-10-CM | POA: Diagnosis not present

## 2023-11-09 ENCOUNTER — Other Ambulatory Visit (HOSPITAL_COMMUNITY): Payer: Self-pay

## 2023-11-10 ENCOUNTER — Other Ambulatory Visit (HOSPITAL_COMMUNITY): Payer: Self-pay

## 2023-11-11 ENCOUNTER — Other Ambulatory Visit (HOSPITAL_COMMUNITY): Payer: Self-pay

## 2023-11-14 DIAGNOSIS — H04123 Dry eye syndrome of bilateral lacrimal glands: Secondary | ICD-10-CM | POA: Diagnosis not present

## 2023-11-16 DIAGNOSIS — I2510 Atherosclerotic heart disease of native coronary artery without angina pectoris: Secondary | ICD-10-CM | POA: Diagnosis not present

## 2023-11-16 DIAGNOSIS — J449 Chronic obstructive pulmonary disease, unspecified: Secondary | ICD-10-CM | POA: Diagnosis not present

## 2023-11-16 DIAGNOSIS — J9601 Acute respiratory failure with hypoxia: Secondary | ICD-10-CM | POA: Diagnosis not present

## 2023-11-16 DIAGNOSIS — B348 Other viral infections of unspecified site: Secondary | ICD-10-CM | POA: Diagnosis not present

## 2023-11-17 ENCOUNTER — Other Ambulatory Visit (HOSPITAL_COMMUNITY): Payer: Self-pay

## 2023-11-17 MED ORDER — TAMSULOSIN HCL 0.4 MG PO CAPS
0.4000 mg | ORAL_CAPSULE | Freq: Every day | ORAL | 6 refills | Status: DC
  Filled 2023-11-17 – 2024-02-15 (×2): qty 30, 30d supply, fill #0
  Filled 2024-03-23: qty 30, 30d supply, fill #1
  Filled 2024-04-23: qty 30, 30d supply, fill #2
  Filled 2024-05-27 – 2024-05-29 (×2): qty 30, 30d supply, fill #3
  Filled 2024-06-24: qty 30, 30d supply, fill #4
  Filled 2024-07-23: qty 30, 30d supply, fill #5
  Filled 2024-08-25: qty 30, 30d supply, fill #6

## 2023-11-18 ENCOUNTER — Other Ambulatory Visit (HOSPITAL_COMMUNITY): Payer: Self-pay

## 2023-11-18 MED ORDER — TAMSULOSIN HCL 0.4 MG PO CAPS
0.4000 mg | ORAL_CAPSULE | Freq: Every day | ORAL | 1 refills | Status: DC
Start: 2023-11-18 — End: 2024-03-07
  Filled 2023-11-18 (×2): qty 90, 90d supply, fill #0

## 2023-12-02 ENCOUNTER — Other Ambulatory Visit (HOSPITAL_COMMUNITY): Payer: Self-pay

## 2023-12-02 DIAGNOSIS — N2581 Secondary hyperparathyroidism of renal origin: Secondary | ICD-10-CM | POA: Diagnosis not present

## 2023-12-02 DIAGNOSIS — Z6841 Body Mass Index (BMI) 40.0 and over, adult: Secondary | ICD-10-CM | POA: Diagnosis not present

## 2023-12-02 DIAGNOSIS — I10 Essential (primary) hypertension: Secondary | ICD-10-CM | POA: Diagnosis not present

## 2023-12-02 DIAGNOSIS — I129 Hypertensive chronic kidney disease with stage 1 through stage 4 chronic kidney disease, or unspecified chronic kidney disease: Secondary | ICD-10-CM | POA: Diagnosis not present

## 2023-12-02 MED ORDER — DILTIAZEM HCL ER COATED BEADS 360 MG PO CP24
360.0000 mg | ORAL_CAPSULE | Freq: Every day | ORAL | 3 refills | Status: AC
  Filled 2023-12-02: qty 90, 90d supply, fill #0
  Filled 2024-03-05: qty 90, 90d supply, fill #1
  Filled 2024-06-03: qty 90, 90d supply, fill #2
  Filled 2024-09-01: qty 90, 90d supply, fill #3

## 2023-12-03 ENCOUNTER — Other Ambulatory Visit (HOSPITAL_COMMUNITY): Payer: Self-pay

## 2023-12-07 ENCOUNTER — Other Ambulatory Visit (HOSPITAL_COMMUNITY): Payer: Self-pay

## 2023-12-07 ENCOUNTER — Other Ambulatory Visit: Payer: Self-pay

## 2023-12-07 DIAGNOSIS — N183 Chronic kidney disease, stage 3 unspecified: Secondary | ICD-10-CM | POA: Diagnosis not present

## 2023-12-07 DIAGNOSIS — N189 Chronic kidney disease, unspecified: Secondary | ICD-10-CM | POA: Diagnosis not present

## 2023-12-07 DIAGNOSIS — N2581 Secondary hyperparathyroidism of renal origin: Secondary | ICD-10-CM | POA: Diagnosis not present

## 2023-12-07 DIAGNOSIS — I129 Hypertensive chronic kidney disease with stage 1 through stage 4 chronic kidney disease, or unspecified chronic kidney disease: Secondary | ICD-10-CM | POA: Diagnosis not present

## 2023-12-07 DIAGNOSIS — I10 Essential (primary) hypertension: Secondary | ICD-10-CM | POA: Diagnosis not present

## 2023-12-07 DIAGNOSIS — D631 Anemia in chronic kidney disease: Secondary | ICD-10-CM | POA: Diagnosis not present

## 2023-12-07 MED ORDER — DAPAGLIFLOZIN PROPANEDIOL 10 MG PO TABS
10.0000 mg | ORAL_TABLET | Freq: Every day | ORAL | 12 refills | Status: AC
  Filled 2023-12-07: qty 30, 30d supply, fill #0
  Filled 2024-01-05: qty 30, 30d supply, fill #1
  Filled 2024-02-04: qty 30, 30d supply, fill #2
  Filled 2024-03-05: qty 30, 30d supply, fill #3
  Filled 2024-04-07: qty 30, 30d supply, fill #4
  Filled 2024-05-17: qty 30, 30d supply, fill #5
  Filled 2024-06-17: qty 30, 30d supply, fill #6
  Filled 2024-07-15: qty 30, 30d supply, fill #7
  Filled 2024-08-11: qty 30, 30d supply, fill #8
  Filled 2024-09-12: qty 30, 30d supply, fill #9
  Filled 2024-10-12: qty 30, 30d supply, fill #10

## 2023-12-07 MED ORDER — TRAZODONE HCL 150 MG PO TABS
150.0000 mg | ORAL_TABLET | Freq: Every day | ORAL | 3 refills | Status: AC
Start: 2023-12-07 — End: ?
  Filled 2023-12-07: qty 90, 90d supply, fill #0
  Filled 2023-12-08: qty 30, 30d supply, fill #0
  Filled 2024-01-05 (×2): qty 30, 30d supply, fill #1

## 2023-12-08 ENCOUNTER — Other Ambulatory Visit (HOSPITAL_COMMUNITY): Payer: Self-pay

## 2023-12-17 DIAGNOSIS — I2510 Atherosclerotic heart disease of native coronary artery without angina pectoris: Secondary | ICD-10-CM | POA: Diagnosis not present

## 2023-12-17 DIAGNOSIS — B348 Other viral infections of unspecified site: Secondary | ICD-10-CM | POA: Diagnosis not present

## 2023-12-17 DIAGNOSIS — J9601 Acute respiratory failure with hypoxia: Secondary | ICD-10-CM | POA: Diagnosis not present

## 2023-12-17 DIAGNOSIS — J449 Chronic obstructive pulmonary disease, unspecified: Secondary | ICD-10-CM | POA: Diagnosis not present

## 2023-12-18 NOTE — Progress Notes (Deleted)
 Cardiology Office Note    Patient Name: [name] Date of Encounter: 12/18/2023  Primary Care Provider:  Margarete Sharps, MD Primary Cardiologist:  Olinda Bertrand, DO Primary Electrophysiologist: None   Past Medical History    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   ED (erectile dysfunction)    GERD (gastroesophageal reflux disease)    Hypercalcemia    Hyperlipidemia    Hypertension    Insomnia    Long-term use of high-risk medication    Nephrocalcinosis    Nephrolithiasis    Obesity    Osteopenia    Pancreatitis    admitted 03/19/01-06/05/01   PE (pulmonary embolism) 01/30/2013   Proteinuria    Pulmonary embolism (HCC) 01/30/2013   Sarcoidosis    Sleep apnea    Smoker    Stroke [facility])     History of Present Illness  [name] is a 68 y.o. male with a PMH of nonobstructive CAD per [facility] in 2022, previous cardiac arrest in 2002, aortic atherosclerosis HFpEF, sarcoidosis, COPD, HTN, HLD, CVA, OSA (not on CPAP), history of PE, CKD stage IIIb, AAA (4.1 cm x 4.2 cm) NSVT, MSSA bacteremia in 2022 who presents today for 23-month follow-up.  Mr. Jaskiewicz was last seen on 09/19/2023 following hospitalization and to arrange for diagnostic TEE.  During his visit he was doing well with no complaints of chills or fevers.  His blood pressure however was elevated at 150/86 and 148/100 on recheck.  We increased his hydralazine to 100 mg 3 times daily and patient was advised to check readings over the next 2 weeks.  He underwent TEE to evaluate for possible valve  vegetation related to positive blood cultures.  TEE results showed no evidence of vegetation or endocarditis.   Patient denies chest pain, palpitations, dyspnea, PND, orthopnea, nausea, vomiting, dizziness, syncope, edema, weight gain, or early satiety.   Discussed the use of AI scribe software for clinical note transcription with the patient, who gave verbal consent to proceed.  History of Present Illness    ***Notes: -Last ischemic evaluation:  Review of Systems  Please see the history of present illness.    All other systems reviewed and are otherwise negative except as noted above.  Physical Exam    Wt Readings from Last 3 Encounters:  09/27/23 211 lb (95.7 kg)  09/19/23 219 lb (99.3 kg)  09/08/23 218 lb (98.9 kg)   UE:AVWUJ were no vitals filed for this visit.,There is no height or weight on file to calculate BMI. GEN: Well nourished, well developed in no acute distress Neck: No JVD; No carotid bruits Pulmonary: Clear to auscultation without rales, wheezing or rhonchi  Cardiovascular: Normal rate. Regular rhythm. Normal S1. Normal S2.   Murmurs: There is no murmur.  ABDOMEN: Soft, non-tender, non-distended EXTREMITIES:  No edema; No deformity   EKG/LABS/ Recent Cardiac Studies   ECG personally reviewed by me today - ***  Risk Assessment/Calculations:   {Does this patient have ATRIAL FIBRILLATION?:[phone]}      Lab Results  Component Value  Date   WBC 6.8 09/19/2023   HGB 13.6 09/19/2023   HCT 40.3 09/19/2023   MCV 93 09/19/2023   PLT 243 09/19/2023   Lab Results  Component Value Date   CREATININE 1.64 (H) 09/19/2023   BUN 11 09/19/2023   NA 145 (H) 09/19/2023   K 4.4 09/19/2023   CL 104 09/19/2023   CO2 28 09/19/2023   Lab Results  Component Value Date   CHOL 115 08/11/2023   HDL 35 (L) 08/11/2023   LDLCALC 58 08/11/2023   TRIG 111 08/11/2023   CHOLHDL 3.3 08/11/2023    Lab Results  Component Value Date   HGBA1C 5.7 (H) 04/08/2021    Assessment & Plan    1.Chronic HFpEF:   2.Nonobstructive CAD: -Previous LHC/RHC completed 2022 showing nonobstructive CAD 70% stenosis of RCA with 3% stenosis of LAD and 30% circumflex stenosis treated medically.  3.  Essential hypertension  4.  Mixed hyperlipidemia      Disposition: Follow-up with Sunit Tolia, DO or APP in *** months {Are you ordering a CV Procedure (e.g. stress test, cath, DCCV, TEE, etc)?   Press F2        :[id number]}   Signed, Francene Ing, Retha Cast, NP 12/18/2023, 12:45 PM [facility]

## 2023-12-19 ENCOUNTER — Ambulatory Visit: Payer: PPO | Admitting: Nurse Practitioner

## 2023-12-19 DIAGNOSIS — I5032 Chronic diastolic (congestive) heart failure: Secondary | ICD-10-CM

## 2023-12-19 DIAGNOSIS — E782 Mixed hyperlipidemia: Secondary | ICD-10-CM

## 2023-12-19 DIAGNOSIS — I10 Essential (primary) hypertension: Secondary | ICD-10-CM

## 2023-12-19 DIAGNOSIS — I2510 Atherosclerotic heart disease of native coronary artery without angina pectoris: Secondary | ICD-10-CM

## 2023-12-28 NOTE — Progress Notes (Deleted)
  Cardiology Office Note:  .   Date:  12/28/2023  ID:  [name], DOB 19-Nov-1955, MRN [id number] PCP: Margarete Sharps, MD  [facility] Providers Cardiologist:  Olinda Bertrand, DO { Click to update primary MD,subspecialty MD or APP then REFRESH:1}   History of Present Illness: [name] is a 68 y.o. male  with a PMH of nonobstructive CAD per [facility] in 2022, previous cardiac arrest in 2002, aortic atherosclerosis HFpEF, sarcoidosis, COPD, HTN, HLD, CVA, OSA (not on CPAP), history of PE, CKD stage IIIb, AAA (4.1 cm x 4.2 cm) NSVT, MSSA bacteremia in 2022 who presents today for 45-month follow-up.  [name] was last seen on 09/19/2023 following hospitalization and to arrange for diagnostic TEE.  During his visit he was doing well with no complaints of chills or fevers.  His blood pressure however was elevated at 150/86 and 148/100 on recheck.  We increased his hydralazine  to 100 mg 3 times daily and patient was advised to check readings over the next 2 weeks.  He underwent TEE to evaluate for possible valve vegetation related to positive blood cultures.  TEE results showed no evidence of vegetation or endocarditis.   ROS: ***  Studies Reviewed: Aaron Aas         Prior CV Studies: {Select studies to display:26339}  ***  Risk Assessment/Calculations:   {Does this patient have ATRIAL FIBRILLATION?[phone]} No BP recorded.  {Refresh Note OR Click here to enter BP  :1}***       Physical Exam:   VS:  There were no vitals taken for this visit.   Wt Readings from Last 3 Encounters:  09/27/23 211 lb (95.7 kg)  09/19/23 219 lb (99.3 kg)  09/08/23 218 lb (98.9 kg)    GEN: Well nourished, well developed in no acute distress NECK: No JVD; No carotid bruits CARDIAC: ***RRR, no murmurs, rubs, gallops RESPIRATORY:  Clear to auscultation without rales, wheezing or rhonchi  ABDOMEN: Soft, non-tender, non-distended EXTREMITIES:  No edema; No deformity   ASSESSMENT AND PLAN: .    Aaron AasChronic  HFpEF:   2.Nonobstructive CAD: -Previous LHC/RHC completed 2022 showing nonobstructive CAD 70% stenosis of RCA with 3% stenosis of LAD and 30% circumflex stenosis treated medically.  3.  Essential hypertension  4.  Mixed hyperlipidemia     {Are you ordering a CV Procedure (e.g. stress test, cath, DCCV, TEE, etc)?   Press F2        :[id number]}  Dispo: ***  Signed, Theotis Flake, PA-C

## 2024-01-02 ENCOUNTER — Other Ambulatory Visit (HOSPITAL_COMMUNITY): Payer: Self-pay

## 2024-01-02 MED ORDER — ISOSORBIDE MONONITRATE ER 30 MG PO TB24
90.0000 mg | ORAL_TABLET | Freq: Every day | ORAL | 3 refills | Status: AC
  Filled 2024-01-02: qty 270, 90d supply, fill #0
  Filled 2024-05-17 – 2024-06-04 (×3): qty 270, 90d supply, fill #1

## 2024-01-05 ENCOUNTER — Other Ambulatory Visit (HOSPITAL_COMMUNITY): Payer: Self-pay

## 2024-01-10 ENCOUNTER — Ambulatory Visit: Attending: Physician Assistant | Admitting: Physician Assistant

## 2024-01-12 ENCOUNTER — Encounter: Payer: Self-pay | Admitting: Physician Assistant

## 2024-01-16 DIAGNOSIS — B348 Other viral infections of unspecified site: Secondary | ICD-10-CM | POA: Diagnosis not present

## 2024-01-16 DIAGNOSIS — J449 Chronic obstructive pulmonary disease, unspecified: Secondary | ICD-10-CM | POA: Diagnosis not present

## 2024-01-16 DIAGNOSIS — J9601 Acute respiratory failure with hypoxia: Secondary | ICD-10-CM | POA: Diagnosis not present

## 2024-01-16 DIAGNOSIS — I2510 Atherosclerotic heart disease of native coronary artery without angina pectoris: Secondary | ICD-10-CM | POA: Diagnosis not present

## 2024-01-20 ENCOUNTER — Other Ambulatory Visit (HOSPITAL_COMMUNITY): Payer: Self-pay

## 2024-01-20 MED ORDER — PREDNISONE 20 MG PO TABS
20.0000 mg | ORAL_TABLET | Freq: Every day | ORAL | 0 refills | Status: DC
  Filled 2024-01-20: qty 3, 3d supply, fill #0

## 2024-01-20 MED ORDER — LIDOCAINE 5 % EX PTCH
1.0000 | MEDICATED_PATCH | Freq: Two times a day (BID) | CUTANEOUS | 0 refills | Status: AC
  Filled 2024-01-20: qty 10, 10d supply, fill #0

## 2024-01-24 ENCOUNTER — Other Ambulatory Visit (HOSPITAL_COMMUNITY): Payer: Self-pay

## 2024-01-24 MED ORDER — PREDNISONE 10 MG (21) PO TBPK
ORAL_TABLET | ORAL | 0 refills | Status: DC
Start: 2024-01-24 — End: 2024-04-05
  Filled 2024-01-24: qty 21, 6d supply, fill #0

## 2024-01-24 NOTE — Progress Notes (Signed)
 Subjective:    [name] is a 68 y.o. (DOB March 21, 1956) male.     Patient presents with  . Follow-up     HPI Last visit 05/20/2022 with Colleen.  History of revision L3-L5 PSF on 03/01/19 with Dr. 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 He had relief of pre-op right LE pain. Patient had been doing well until the last month. He has been managing at home. Pain increased about 4 weeks ago, no fall or injury. Pain in left lower lumbar and buttocks radiating down the leg posterior leg to the calf. He saw PCP and took pred, unsure of dose, which did not help.Unfortunately no improvement with bilateral lower lumbar TPI done 01/08/22. Previous left lumbar TPI 10/01/21 with good relief of left muscular pain. Primary complaint today continues to be midline lower lumbar spine with activity. He rates his pain 7/10. He is not using any assistive devices for ambulation today. He notes improved gait and balance by slowing down.  He has discontinued gabapentin .  Denies red flag signs or symptoms.    Reviewed and updated this visit by provider: Allergies  Meds  Problems  Med Hx  Surg Hx  Fam Hx       Review of Systems  Constitutional:  Negative for activity change, appetite change, chills, diaphoresis, fatigue, fever and unexpected weight change.  Respiratory:  Negative for shortness of breath.   Cardiovascular:  Negative for chest pain.  Genitourinary:  Negative for difficulty urinating.      Objective:   Vitals:   01/24/24 0819  BP: (!) 160/110  Pulse: 56  Height: 6' (1.829 m)  Weight: 215 lb (97.5 kg)  BMI (Calculated): 29.2  PainSc:   8   Physical Exam Constitutional:      General: He is not in acute distress.    Appearance: Normal appearance. He is not ill-appearing or toxic-appearing.  Musculoskeletal:     Cervical back: No deformity.     Thoracic back: No deformity.  Neurological:     Mental Status: He is alert.         Imaging: X-rays from Jan 2023,  anterior/posterior L3-L4 fusion and posterior L3-L5 fusion with hardware stable, no loosening or complications. Evidence of bony fusion in lateral gutters. Disc collapse and spondylosis at adjacent segment at L5-S1.  Assessment / Plan:   Assessment 1. Chronic pain syndrome (Primary) 2. Chronic left-sided low back pain with left-sided sciatica 3. Lumbosacral spondylosis with radiculopathy -     predniSONE  (DELTASONE ) 10 mg tablet; 10 mg pred pack. Tapering pack, take as directed for 6 days, Normal    Plan Patient will participate in activities as tolerated using pain as their guide. Patient was educated on current diagnosis and typical treatment algorithm.  Pred pack sent and instructions were given.  He has previously responded well to this for flareups.  We discussed gabapentin .  If his symptoms persist after he completes the prednisone  pack he will call and we will consider sending refill of his gabapentin  to his pharmacy to resume.  Consider MRI L-spine with and without gad. He finds SalonPas patches helpful.  Continue Lidoderm  patch if helpful.  Consider physical therapy. He would like to try exercising at the [facility] before considering formal PT. He has been to PT in the past and will resume HEP and water exercises. 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 Consider guided injections with Dr. Darlean. He is hesitant to repeat guided injections due to previously being very painful. I also recommend using a cane to assist ambulation and decrease fall risk, prescription provided today.   Previously discussed poor balance. He reports improvement with slowing down. Previous exam 11/17/21, Romberg's positive, unable to tandem walk. Discussed possibility of myelopathy. He denies cervical or thoracic pain. Discussed x-rays and MRI to r/o stenosis. He declined  again today. He will discuss balance issues further with PCP. If not improvement, he will schedule appt on his private insurance because this is not part of his WC claim.   Questions were encouraged and answered today. Instructed to call with any new questions or concerns. Greater than 30 minutes was spent today reviewing patient chart, reviewing history, physical exam, education and counseling, medication management and documentation. Follow up with Colleen in 3 weeks to assess source his response to the prednisone  pack.  We will consider gabapentin  if symptoms persist.  Consider physical therapy, trigger point injection and MRI L-spine.  Consider further eval of neck on private insurance, he declined.  He understands the potential for permanent nerve injury..  Risks, benefits, and alternatives of the medications and treatment plan prescribed today were discussed, and patient expressed understanding. Plan follow-up as discussed or as needed if any worsening symptoms or change in condition.

## 2024-01-25 ENCOUNTER — Other Ambulatory Visit (HOSPITAL_COMMUNITY): Payer: Self-pay

## 2024-01-25 DIAGNOSIS — H401231 Low-tension glaucoma, bilateral, mild stage: Secondary | ICD-10-CM | POA: Diagnosis not present

## 2024-01-25 MED ORDER — TIMOLOL MALEATE 0.5 % OP SOLN
1.0000 [drp] | Freq: Two times a day (BID) | OPHTHALMIC | 3 refills | Status: AC
  Filled 2024-01-25 – 2024-03-23 (×2): qty 15, 150d supply, fill #0

## 2024-02-04 ENCOUNTER — Other Ambulatory Visit: Payer: Self-pay | Admitting: Nurse Practitioner

## 2024-02-06 ENCOUNTER — Other Ambulatory Visit (HOSPITAL_COMMUNITY): Payer: Self-pay

## 2024-02-06 ENCOUNTER — Other Ambulatory Visit: Payer: Self-pay

## 2024-02-06 ENCOUNTER — Other Ambulatory Visit (HOSPITAL_BASED_OUTPATIENT_CLINIC_OR_DEPARTMENT_OTHER): Payer: Self-pay

## 2024-02-06 MED ORDER — HYDRALAZINE HCL 100 MG PO TABS
100.0000 mg | ORAL_TABLET | Freq: Three times a day (TID) | ORAL | 2 refills | Status: AC
  Filled 2024-02-06: qty 270, 90d supply, fill #0
  Filled 2024-05-17: qty 270, 90d supply, fill #1
  Filled 2024-08-25: qty 270, 90d supply, fill #2

## 2024-02-06 MED ORDER — AZITHROMYCIN 250 MG PO TABS
ORAL_TABLET | ORAL | 0 refills | Status: DC
  Filled 2024-02-06: qty 6, 5d supply, fill #0

## 2024-02-08 DIAGNOSIS — L820 Inflamed seborrheic keratosis: Secondary | ICD-10-CM | POA: Diagnosis not present

## 2024-02-08 DIAGNOSIS — Z85828 Personal history of other malignant neoplasm of skin: Secondary | ICD-10-CM | POA: Diagnosis not present

## 2024-02-14 ENCOUNTER — Other Ambulatory Visit (HOSPITAL_COMMUNITY): Payer: Self-pay

## 2024-02-14 MED ORDER — GABAPENTIN 300 MG PO CAPS
ORAL_CAPSULE | ORAL | 2 refills | Status: DC
  Filled 2024-02-14: qty 90, 30d supply, fill #0

## 2024-02-14 NOTE — Progress Notes (Signed)
 Subjective:    [name] is a 68 y.o. (DOB Feb 16, 1956) male.     Patient presents with  . Follow-up     HPI Last visit 05/20/2022 with Colleen.  History of revision L3-L5 PSF on 03/01/19 with Dr. 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 He had relief of pre-op right LE pain.  Comes in for recheck of his low back and left lower extremity pain.  He reports he saw significant improvement with the prednisone  Dosepak.  He still has some pain that does bother him but he is not interested in pursuing aggressive treatments.  Current symptoms flared up in April 2025 without injury.  Unfortunately no improvement with bilateral lower lumbar TPI done 01/08/22. Previous left lumbar TPI 10/01/21 with good relief of left muscular pain. Primary complaint today continues to be midline lower lumbar spine with activity. He rates his pain 7/10. He is not using any assistive devices for ambulation today. He notes improved gait and balance by slowing down.  He has discontinued gabapentin .  Denies red flag signs or symptoms.    Reviewed and updated this visit by provider: Tobacco  Allergies  Meds  Problems  Med Hx  Surg Hx  Fam Hx       Review of Systems  Constitutional:  Negative for activity change, appetite change, chills, diaphoresis, fatigue, fever and unexpected weight change.  Respiratory:  Negative for shortness of breath.   Cardiovascular:  Negative for chest pain.  Genitourinary:  Negative for difficulty urinating.      Objective:   Vitals:   02/14/24 0807  Height: 6' (1.829 m) Comment: per patient  Weight: 220 lb (99.8 kg) Comment: Per patient  BMI (Calculated): 29.8  PainSc:   7    Physical Exam Constitutional:      General: He is not in acute distress.    Appearance: Normal appearance. He is not ill-appearing or toxic-appearing.  Musculoskeletal:     Cervical back: No deformity.     Thoracic back: No deformity.  Neurological:     Mental Status: He is alert.        Imaging: X-rays from Jan 2023, anterior/posterior L3-L4 fusion and posterior L3-L5 fusion with hardware stable, no loosening or complications. Evidence of bony fusion in lateral gutters. Disc collapse and spondylosis at adjacent segment at L5-S1.  Assessment / Plan:   Assessment 1. Chronic pain syndrome (Primary) -     gabapentin  (NEURONTIN ) 300 mg capsule; Take one capsule (300 mg dose) by mouth 3 (three) times a day., Starting Tue 02/14/2024, Until Mon 05/14/2024, Normal 2. Chronic bilateral low back pain with left-sided sciatica -     AMB REFERRAL TO PHYSICAL THERAPY EVALUATION AND TREATMENT 3. History of lumbar fusion -     AMB REFERRAL TO PHYSICAL THERAPY EVALUATION AND TREATMENT     Plan Patient will participate in activities as tolerated using pain as their guide. Patient was educated on current diagnosis and typical treatment algorithm.  Partial improvement with prednisone  Dosepak.  Discussed gabapentin  again today, he would like to try and see if this helps his remaining symptoms.  Discussed gabapentin  300 mg starting at night increasing to twice daily and 3 times daily as needed and tolerated.  He understands the instructions.  Physical therapy prescription sent.  Goal is to improve his gait balance and core strength.  Consider MRI L-spine with and without gad. He finds SalonPas patches helpful.  Continue Lidoderm  patch if helpful.  He will continue exercising at the [facility]. 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 Consider guided injections with Dr. Darlean. He is hesitant to repeat guided injections due to previously being very painful. I also recommend using a cane to assist ambulation and decrease fall risk, prescription provided today.   He previously settled WC case. 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   Previously discussed poor balance. He reports improvement with slowing down. Previous exam  11/17/21, Romberg's positive, unable to tandem walk. Discussed possibility of myelopathy. He denies cervical or thoracic pain. Discussed x-rays and MRI to r/o stenosis. He declined again today. He will discuss balance issues further with PCP. If not improvement, he will schedule appt on his private insurance because this is not part of his WC claim.   Questions were encouraged and answered today. Instructed to call with any new questions or concerns. Greater than 30 minutes was spent today reviewing patient chart, reviewing history, physical exam, education and counseling, medication management and documentation. Follow up with Colleen in 3 weeks to assess source his response to the prednisone  pack.   Consider trigger point injection, MRI L-spine and guided injections.  Consider further eval of neck on private insurance, he declined.  He understands the potential for permanent nerve injury..  Risks, benefits, and alternatives of the medications and treatment plan prescribed today were discussed, and patient expressed understanding. Plan follow-up as discussed or as needed if any worsening symptoms or change in condition.

## 2024-02-16 ENCOUNTER — Other Ambulatory Visit (HOSPITAL_COMMUNITY): Payer: Self-pay

## 2024-02-16 ENCOUNTER — Other Ambulatory Visit: Payer: Self-pay

## 2024-02-16 DIAGNOSIS — J9601 Acute respiratory failure with hypoxia: Secondary | ICD-10-CM | POA: Diagnosis not present

## 2024-02-16 DIAGNOSIS — B348 Other viral infections of unspecified site: Secondary | ICD-10-CM | POA: Diagnosis not present

## 2024-02-16 DIAGNOSIS — J449 Chronic obstructive pulmonary disease, unspecified: Secondary | ICD-10-CM | POA: Diagnosis not present

## 2024-02-16 DIAGNOSIS — I2510 Atherosclerotic heart disease of native coronary artery without angina pectoris: Secondary | ICD-10-CM | POA: Diagnosis not present

## 2024-02-27 ENCOUNTER — Encounter: Payer: Self-pay | Admitting: *Deleted

## 2024-02-28 ENCOUNTER — Encounter: Payer: Self-pay | Admitting: *Deleted

## 2024-02-29 ENCOUNTER — Ambulatory Visit: Attending: Cardiology | Admitting: Emergency Medicine

## 2024-02-29 NOTE — Progress Notes (Deleted)
  Cardiology Office Note:    Date:  02/29/2024  ID:  [name], DOB 1956-03-27, MRN [id number] PCP: Onita Rush, MD  [facility] Providers Cardiologist:  Madonna Large, DO { Click to update primary MD,subspecialty MD or APP then REFRESH:1}    {Click to Open Review  :1}   Patient Profile:       Chief Complaint: *** History of Present Illness:  [name] is a 68 y.o. male with visit-pertinent history of nonobstructive CAD, previous cardiac arrest in 2002, aortic atherosclerosis, HFpEF, sarcoidosis, COPD, hypertension, hyperlipidemia, CVA, OSA not on CPAP, history of PE, CKD stage IIIb, AAA, NSVT, MSSA bacteremia in 2022  He was admitted 08/05/2023 for complaint of chest pain and shortness of breath.  CT of chest showed signs of pneumonia blood cultures were completed and shown to be positive for MSSA bacteremia and patient was started on IV antibiotics.  He completed MRI of the thoracic lumbar spine and was negative for acute infection.  During his admission a TEE was completed that showed 0.5 cm time 0.8 cm mobile mass in the RV attached to either chordae of the wall of the RV with a small fibrinous mobile mass in the posterior leaflet of the MV.  He had normal biventricular function with no other significant valvular abnormalities.  He was seen by Dr. Large during admission and was doing well from a CHF standpoint was noted to have AKI with rising creatinine with most recent troponins negative.  Recommendations were made to continue antibiotics per ID with completion of 6 weeks of antibiotics and reevaluation by general cardiology.  He was discharged with a PICC line in place on 08/12/2023 and 6 weeks of antibiotic therapy.  He was last seen in office on 09/19/2023 by Jackee, NP.  He was doing well at that time.  His blood pressure was elevated and his hydralazine  was increased to 100 mg 3 times daily.  TEE was repeated on 09/27/2023 with LVEF 60 to 65%, no RWMA with no evidence of  vegetation/infective endocarditis.  Discussed the use of AI scribe software for clinical note transcription with the patient, who gave verbal consent to proceed.  History of Present Illness     Review of systems:  Please see the history of present illness. All other systems are reviewed and otherwise negative. ***      Studies Reviewed:        ***  Risk Assessment/Calculations:   {Does this patient have ATRIAL FIBRILLATION?:[id number]} No BP recorded.  {Refresh Note OR Click here to enter BP  :1}***        Physical Exam:   VS:  There were no vitals taken for this visit.   Wt Readings from Last 3 Encounters:  09/27/23 211 lb (95.7 kg)  09/19/23 219 lb (99.3 kg)  09/08/23 218 lb (98.9 kg)    GEN: Well nourished, well developed in no acute distress NECK: No JVD; No carotid bruits CARDIAC: ***RRR, no murmurs, rubs, gallops RESPIRATORY:  Clear to auscultation without rales, wheezing or rhonchi  ABDOMEN: Soft, non-tender, non-distended EXTREMITIES:  No edema; No acute deformity ***      Assessment and Plan:    Assessment and Plan Assessment & Plan      {Are you ordering a CV Procedure (e.g. stress test, cath, DCCV, TEE, etc)?   Press F2        :[id number]}  Dispo:  No follow-ups on file.  Signed, Lum LITTIE Louis, NP

## 2024-03-07 ENCOUNTER — Emergency Department (HOSPITAL_BASED_OUTPATIENT_CLINIC_OR_DEPARTMENT_OTHER)

## 2024-03-07 ENCOUNTER — Emergency Department (HOSPITAL_COMMUNITY)

## 2024-03-07 ENCOUNTER — Other Ambulatory Visit: Payer: Self-pay

## 2024-03-07 ENCOUNTER — Emergency Department (HOSPITAL_BASED_OUTPATIENT_CLINIC_OR_DEPARTMENT_OTHER)
Admission: EM | Admit: 2024-03-07 | Discharge: 2024-03-08 | Disposition: A | Discharge status: Home / Self Care | Source: Ambulatory Visit | Attending: Emergency Medicine | Admitting: Emergency Medicine

## 2024-03-07 ENCOUNTER — Encounter (HOSPITAL_BASED_OUTPATIENT_CLINIC_OR_DEPARTMENT_OTHER): Payer: Self-pay | Admitting: Emergency Medicine

## 2024-03-07 DIAGNOSIS — Z7982 Long term (current) use of aspirin: Secondary | ICD-10-CM | POA: Diagnosis not present

## 2024-03-07 DIAGNOSIS — Z79899 Other long term (current) drug therapy: Secondary | ICD-10-CM | POA: Insufficient documentation

## 2024-03-07 DIAGNOSIS — N1832 Chronic kidney disease, stage 3b: Secondary | ICD-10-CM

## 2024-03-07 DIAGNOSIS — R410 Disorientation, unspecified: Secondary | ICD-10-CM | POA: Diagnosis not present

## 2024-03-07 DIAGNOSIS — N189 Chronic kidney disease, unspecified: Secondary | ICD-10-CM | POA: Insufficient documentation

## 2024-03-07 DIAGNOSIS — R4182 Altered mental status, unspecified: Secondary | ICD-10-CM | POA: Diagnosis not present

## 2024-03-07 DIAGNOSIS — I10 Essential (primary) hypertension: Secondary | ICD-10-CM

## 2024-03-07 DIAGNOSIS — I130 Hypertensive heart and chronic kidney disease with heart failure and stage 1 through stage 4 chronic kidney disease, or unspecified chronic kidney disease: Secondary | ICD-10-CM | POA: Insufficient documentation

## 2024-03-07 DIAGNOSIS — I700 Atherosclerosis of aorta: Secondary | ICD-10-CM | POA: Diagnosis not present

## 2024-03-07 DIAGNOSIS — R4701 Aphasia: Secondary | ICD-10-CM | POA: Diagnosis not present

## 2024-03-07 DIAGNOSIS — M7989 Other specified soft tissue disorders: Secondary | ICD-10-CM | POA: Insufficient documentation

## 2024-03-07 DIAGNOSIS — I6782 Cerebral ischemia: Secondary | ICD-10-CM | POA: Diagnosis not present

## 2024-03-07 DIAGNOSIS — R413 Other amnesia: Secondary | ICD-10-CM

## 2024-03-07 DIAGNOSIS — I517 Cardiomegaly: Secondary | ICD-10-CM | POA: Diagnosis not present

## 2024-03-07 DIAGNOSIS — I6529 Occlusion and stenosis of unspecified carotid artery: Secondary | ICD-10-CM | POA: Diagnosis not present

## 2024-03-07 DIAGNOSIS — R030 Elevated blood-pressure reading, without diagnosis of hypertension: Secondary | ICD-10-CM

## 2024-03-07 DIAGNOSIS — E876 Hypokalemia: Secondary | ICD-10-CM | POA: Diagnosis not present

## 2024-03-07 DIAGNOSIS — I509 Heart failure, unspecified: Secondary | ICD-10-CM | POA: Diagnosis not present

## 2024-03-07 DIAGNOSIS — Z8673 Personal history of transient ischemic attack (TIA), and cerebral infarction without residual deficits: Secondary | ICD-10-CM | POA: Insufficient documentation

## 2024-03-07 DIAGNOSIS — J9811 Atelectasis: Secondary | ICD-10-CM | POA: Diagnosis not present

## 2024-03-07 DIAGNOSIS — R29818 Other symptoms and signs involving the nervous system: Secondary | ICD-10-CM | POA: Diagnosis not present

## 2024-03-07 DIAGNOSIS — I129 Hypertensive chronic kidney disease with stage 1 through stage 4 chronic kidney disease, or unspecified chronic kidney disease: Secondary | ICD-10-CM | POA: Diagnosis not present

## 2024-03-07 DIAGNOSIS — R9082 White matter disease, unspecified: Secondary | ICD-10-CM | POA: Diagnosis not present

## 2024-03-07 DIAGNOSIS — I6381 Other cerebral infarction due to occlusion or stenosis of small artery: Secondary | ICD-10-CM | POA: Diagnosis not present

## 2024-03-07 LAB — URINALYSIS, W/ REFLEX TO CULTURE (INFECTION SUSPECTED)
Bacteria, UA: NONE SEEN
Bilirubin Urine: NEGATIVE
Glucose, UA: >1000 mg/dL — AB
Hgb urine dipstick: NEGATIVE
Ketones, ur: NEGATIVE mg/dL
Leukocytes,Ua: NEGATIVE
Nitrite: NEGATIVE
Protein, ur: >300 mg/dL — AB
Renal Epithelial: <1
Specific Gravity, Urine: 1.017 (ref 1.005–1.030)
pH: 6.5 (ref 5.0–8.0)

## 2024-03-07 LAB — PRO BRAIN NATRIURETIC PEPTIDE: Pro Brain Natriuretic Peptide: 2372 pg/mL — ABNORMAL HIGH (ref <300.0)

## 2024-03-07 LAB — CBC WITH DIFFERENTIAL/PLATELET
Abs Immature Granulocytes: 0.01 10*3/uL (ref 0.00–0.07)
Basophils Absolute: 0.1 10*3/uL (ref 0.0–0.1)
Basophils Relative: 1 %
Eosinophils Absolute: 0.8 10*3/uL — ABNORMAL HIGH (ref 0.0–0.5)
Eosinophils Relative: 13 %
HCT: 35.2 % — ABNORMAL LOW (ref 39.0–52.0)
Hemoglobin: 11.8 g/dL — ABNORMAL LOW (ref 13.0–17.0)
Immature Granulocytes: 0 %
Lymphocytes Relative: 17 %
Lymphs Abs: 1 10*3/uL (ref 0.7–4.0)
MCH: 30.2 pg (ref 26.0–34.0)
MCHC: 33.5 g/dL (ref 30.0–36.0)
MCV: 90 fL (ref 80.0–100.0)
Monocytes Absolute: 0.5 10*3/uL (ref 0.1–1.0)
Monocytes Relative: 9 %
Neutro Abs: 3.6 10*3/uL (ref 1.7–7.7)
Neutrophils Relative %: 60 %
Platelets: 219 10*3/uL (ref 150–400)
RBC: 3.91 MIL/uL — ABNORMAL LOW (ref 4.22–5.81)
RDW: 14.3 % (ref 11.5–15.5)
WBC: 6 10*3/uL (ref 4.0–10.5)
nRBC: 0 % (ref 0.0–0.2)

## 2024-03-07 LAB — COMPREHENSIVE METABOLIC PANEL WITH GFR
ALT: 14 U/L (ref 0–44)
AST: 26 U/L (ref 15–41)
Albumin: 4 g/dL (ref 3.5–5.0)
Alkaline Phosphatase: 84 U/L (ref 38–126)
Anion gap: 8 (ref 5–15)
BUN: 13 mg/dL (ref 8–23)
CO2: 28 mmol/L (ref 22–32)
Calcium: 9.4 mg/dL (ref 8.9–10.3)
Chloride: 106 mmol/L (ref 98–111)
Creatinine, Ser: 1.98 mg/dL — ABNORMAL HIGH (ref 0.61–1.24)
GFR, Estimated: 36 mL/min — ABNORMAL LOW (ref >60)
Glucose, Bld: 101 mg/dL — ABNORMAL HIGH (ref 70–99)
Potassium: 3.7 mmol/L (ref 3.5–5.1)
Sodium: 142 mmol/L (ref 135–145)
Total Bilirubin: 0.6 mg/dL (ref 0.0–1.2)
Total Protein: 6.9 g/dL (ref 6.5–8.1)

## 2024-03-07 LAB — CBG MONITORING, ED: Glucose-Capillary: 104 mg/dL — ABNORMAL HIGH (ref 70–99)

## 2024-03-07 LAB — TSH: TSH: 1.02 u[IU]/mL (ref 0.350–4.500)

## 2024-03-07 LAB — AMMONIA: Ammonia: 16 umol/L (ref 9–35)

## 2024-03-07 LAB — TROPONIN I (HIGH SENSITIVITY): Troponin I (High Sensitivity): 12 ng/L (ref <18)

## 2024-03-07 LAB — TROPONIN T, HIGH SENSITIVITY: Troponin T High Sensitivity: 64 ng/L — ABNORMAL HIGH (ref <19)

## 2024-03-07 MED ORDER — DAPAGLIFLOZIN PROPANEDIOL 10 MG PO TABS
10.0000 mg | ORAL_TABLET | Freq: Every day | ORAL | Status: DC
  Administered 2024-03-08: 10 mg via ORAL
  Filled 2024-03-07: qty 1

## 2024-03-07 MED ORDER — METOPROLOL SUCCINATE ER 25 MG PO TB24
200.0000 mg | ORAL_TABLET | Freq: Every day | ORAL | Status: DC
  Administered 2024-03-08: 200 mg via ORAL
  Filled 2024-03-07: qty 8

## 2024-03-07 MED ORDER — DILTIAZEM HCL ER COATED BEADS 180 MG PO CP24
360.0000 mg | ORAL_CAPSULE | Freq: Every day | ORAL | Status: DC
  Administered 2024-03-08: 360 mg via ORAL
  Filled 2024-03-07: qty 2

## 2024-03-07 MED ORDER — HYDRALAZINE HCL 50 MG PO TABS
100.0000 mg | ORAL_TABLET | Freq: Three times a day (TID) | ORAL | Status: DC
  Administered 2024-03-07 – 2024-03-08 (×3): 100 mg via ORAL
  Filled 2024-03-07 (×3): qty 2

## 2024-03-07 MED ORDER — IOHEXOL 350 MG/ML SOLN
60.0000 mL | Freq: Once | INTRAVENOUS | Status: AC | PRN
  Administered 2024-03-07: 60 mL via INTRAVENOUS

## 2024-03-07 MED ORDER — ASPIRIN 81 MG PO TBEC
81.0000 mg | DELAYED_RELEASE_TABLET | Freq: Every day | ORAL | Status: DC
  Administered 2024-03-08: 81 mg via ORAL
  Filled 2024-03-07: qty 1

## 2024-03-07 MED ORDER — ATORVASTATIN CALCIUM 40 MG PO TABS
20.0000 mg | ORAL_TABLET | Freq: Every day | ORAL | Status: DC
  Administered 2024-03-08: 20 mg via ORAL
  Filled 2024-03-07: qty 1

## 2024-03-07 MED ORDER — LORAZEPAM 2 MG/ML IJ SOLN
1.0000 mg | Freq: Once | INTRAMUSCULAR | Status: AC
  Administered 2024-03-07: 1 mg via INTRAVENOUS
  Filled 2024-03-07: qty 1

## 2024-03-07 MED ORDER — GADOBUTROL 1 MMOL/ML IV SOLN: 10.0000 mL | Freq: Once | INTRAVENOUS | Status: DC | PRN

## 2024-03-07 MED ORDER — ISOSORBIDE MONONITRATE ER 30 MG PO TB24
90.0000 mg | ORAL_TABLET | Freq: Every day | ORAL | Status: DC
  Administered 2024-03-08: 90 mg via ORAL
  Filled 2024-03-07: qty 3

## 2024-03-07 NOTE — ED Notes (Signed)
 Called [facility] to transport patient to [facility] for MRI--Dr. Emil accepting

## 2024-03-07 NOTE — ED Notes (Signed)
 Report given to the [facility] RN.SABRA

## 2024-03-07 NOTE — ED Provider Notes (Signed)
  EMERGENCY DEPARTMENT AT [facility] Provider Note   CSN: [id number] Arrival date & time: 03/07/24  1056     Patient presents with: No chief complaint on file.   [name] is a 68 y.o. male.   The history is provided by the patient and medical records. No language interpreter was used.  Neurologic Problem This is a new problem. The current episode started more than 2 days ago. The problem occurs constantly. The problem has not changed since onset.Pertinent negatives include no chest pain, no abdominal pain, no headaches and no shortness of breath. Nothing aggravates the symptoms. Nothing relieves the symptoms. He has tried nothing for the symptoms. The treatment provided no relief.       Prior to Admission medications   Medication Sig Start Date End Date Taking? Authorizing Provider  aspirin  EC 81 MG tablet Take 81 mg by mouth daily. Patients Wife gave him 4 tablets this am. 08/06/2023.    [provider]  atorvastatin  (LIPITOR) 20 MG tablet Take 1 tablet (20 mg total) by mouth daily. 08/12/23 02/09/24  Laurence Locus, DO  azithromycin  (ZITHROMAX ) 250 MG tablet Take 2 tablets by mouth today and then take 1 tablet by mouth daily for 4 more days. 02/06/24     dapagliflozin  propanediol (FARXIGA ) 10 MG TABS tablet Take 1 tablet (10 mg total) by mouth daily. 12/07/23     diltiazem  (CARDIZEM  CD) 360 MG 24 hr capsule Take 1 capsule (360 mg total) by mouth daily. 08/12/23 11/10/23  Laurence Locus, DO  diltiazem  (CARDIZEM  CD) 360 MG 24 hr capsule Take 1 capsule (360 mg total) by mouth daily. 12/02/23     gabapentin  (NEURONTIN ) 300 MG capsule Take one capsule (300 mg dose) by mouth 3 (three) times a day. 02/14/24     hydrALAZINE  (APRESOLINE ) 100 MG tablet Take 1 tablet (100 mg total) by mouth 3 (three) times daily. 02/06/24   Wyn Jackee VEAR Mickey., NP  ipratropium-albuterol  (DUONEB) 0.5-2.5 (3) MG/3ML SOLN Take 3 mLs by nebulization every 6 (six) hours as needed (SOB/Wheezing).     [provider]  isosorbide  mononitrate (IMDUR ) 30 MG 24 hr tablet Take 3 tablets (90 mg total) by mouth daily. 08/12/23 11/10/23  Laurence Locus, DO  isosorbide  mononitrate (IMDUR ) 30 MG 24 hr tablet Take 3 tablets (90 mg total) by mouth daily. 01/02/24     lidocaine  (LIDODERM ) 5 % Place 1 patch onto the skin for 12 (twelve) hours as directed as needed. (1 patch on for 12 hours then off for 12 hours) 01/20/24     metoprolol  (TOPROL -XL) 200 MG 24 hr tablet Take 1 tablet (200 mg total) by mouth daily. 08/12/23 11/10/23  Laurence Locus, DO  potassium chloride  SA (KLOR-CON  M) 20 MEQ tablet Take 1 tablet (20 mEq total) by mouth daily as needed (take only if Demadex  is taken). 08/12/23 11/10/23  Laurence Locus, DO  predniSONE  (DELTASONE ) 20 MG tablet Take 1 tablet (20 mg total) by mouth daily with food for 3 days as directed 01/20/24     predniSONE  (STERAPRED UNI-PAK 21 TAB) 10 MG (21) TBPK tablet Tapering pack, take as directed for 6 days 01/24/24     tamsulosin  (FLOMAX ) 0.4 MG CAPS capsule Take 1 capsule (0.4 mg total) by mouth daily. 11/17/23     tamsulosin  (FLOMAX ) 0.4 MG CAPS capsule Take 1 capsule (0.4 mg total) by mouth daily. 11/18/23     timolol  (TIMOPTIC ) 0.5 % ophthalmic solution Place 1 drop into the left eye 2 (  two) times daily. 01/25/24     torsemide  (DEMADEX ) 20 MG tablet Take 1 tablet (20 mg total) by mouth daily as needed (1 lb or more weight gain in 1 day or 3 lbs or more weight gain in 1 week). 08/12/23 11/10/23  Laurence Locus, DO  traZODone  (DESYREL ) 150 MG tablet Take 1 tablet (150 mg total) by mouth at bedtime as needed for up to 90 doses for sleep. 08/12/23   Laurence Locus, DO  traZODone  (DESYREL ) 150 MG tablet Take 1 tablet (150 mg total) by mouth at bedtime. 12/07/23     triamcinolone  cream (KENALOG ) 0.1 % Apply to affected areas twice a day as needed for itching/inflammation. Patient not taking: Reported on 09/19/2023 09/09/21       Allergies: Codeine, Hydrocodone -acetaminophen , and Hydromorphone     Review of  Systems  Constitutional:  Negative for chills, fatigue and fever.  HENT:  Negative for congestion.   Eyes:  Negative for visual disturbance.  Respiratory:  Negative for cough, chest tightness, shortness of breath and wheezing.   Cardiovascular:  Negative for chest pain, palpitations and leg swelling.  Gastrointestinal:  Negative for abdominal pain, constipation, diarrhea, nausea and vomiting.  Genitourinary:  Positive for frequency and urgency. Negative for flank pain.  Musculoskeletal:  Negative for back pain, neck pain and neck stiffness.  Skin:  Negative for rash.  Neurological:  Positive for speech difficulty. Negative for syncope, weakness, light-headedness, numbness and headaches.  Psychiatric/Behavioral:  Positive for confusion. Negative for agitation.   All other systems reviewed and are negative.   Updated Vital Signs BP (!) 135/98   Pulse 60   Temp 100 F (37.8 C) (Rectal)   Resp (!) 22   SpO2 96%   Physical Exam Vitals and nursing note reviewed.  Constitutional:      General: He is not in acute distress.    Appearance: He is well-developed. He is not ill-appearing, toxic-appearing or diaphoretic.  HENT:     Head: Normocephalic and atraumatic.     Nose: No congestion or rhinorrhea.     Mouth/Throat:     Mouth: Mucous membranes are moist.     Pharynx: No oropharyngeal exudate or posterior oropharyngeal erythema.  Eyes:     Extraocular Movements: Extraocular movements intact.     Conjunctiva/sclera: Conjunctivae normal.     Pupils: Pupils are equal, round, and reactive to light.  Cardiovascular:     Rate and Rhythm: Normal rate and regular rhythm.     Heart sounds: No murmur heard. Pulmonary:     Effort: Pulmonary effort is normal. No respiratory distress.     Breath sounds: Normal breath sounds. No wheezing, rhonchi or rales.  Chest:     Chest wall: No tenderness.  Abdominal:     General: Abdomen is flat.     Palpations: Abdomen is soft.     Tenderness: There  is no abdominal tenderness. There is no right CVA tenderness, left CVA tenderness, guarding or rebound.  Musculoskeletal:        General: No swelling or tenderness.     Cervical back: Neck supple. No tenderness.     Right lower leg: No edema.     Left lower leg: No edema.  Skin:    General: Skin is warm and dry.     Capillary Refill: Capillary refill takes less than 2 seconds.     Findings: No erythema or rash.  Neurological:     General: No focal deficit present.     Mental Status:  He is alert.     Sensory: No sensory deficit.     Motor: No weakness.  Psychiatric:        Mood and Affect: Mood normal.     (all labs ordered are listed, but only abnormal results are displayed) Labs Reviewed  CBC WITH DIFFERENTIAL/PLATELET - Abnormal; Notable for the following components:      Result Value   RBC 3.91 (*)    Hemoglobin 11.8 (*)    HCT 35.2 (*)    Eosinophils Absolute 0.8 (*)    All other components within normal limits  COMPREHENSIVE METABOLIC PANEL WITH GFR - Abnormal; Notable for the following components:   Glucose, Bld 101 (*)    Creatinine, Ser 1.98 (*)    GFR, Estimated 36 (*)    All other components within normal limits  URINALYSIS, W/ REFLEX TO CULTURE (INFECTION SUSPECTED) - Abnormal; Notable for the following components:   Glucose, UA >1,000 (*)    Protein, ur >300 (*)    All other components within normal limits  PRO BRAIN NATRIURETIC PEPTIDE - Abnormal; Notable for the following components:   Pro Brain Natriuretic Peptide 2,372.0 (*)    All other components within normal limits  CBG MONITORING, ED - Abnormal; Notable for the following components:   Glucose-Capillary 104 (*)    All other components within normal limits  TROPONIN T, HIGH SENSITIVITY - Abnormal; Notable for the following components:   Troponin T High Sensitivity 64 (*)    All other components within normal limits  TSH  AMMONIA  TROPONIN T, HIGH SENSITIVITY    EKG: EKG  Interpretation Date/Time:  Wednesday March 07 2024 11:21:02 EDT Ventricular Rate:  66 PR Interval:  183 QRS Duration:  109 QT Interval:  440 QTC Calculation: 461 R Axis:   -20  Text Interpretation: Sinus rhythm Incomplete RBBB and LAFB RSR' in V1 or V2, probably normal variant Nonspecific T abnormalities, lateral leads wen compared to prior, more wandering baseline and t wave inversion in leads V4-V6 No STEMI Confirmed by Ginger Barefoot ([id number]) on 03/07/2024 11:28:53 AM  Radiology: US  Venous Img Lower Unilateral Right Result Date: 03/07/2024 CLINICAL DATA:  Altered mental status. Confusion. Evaluate for occult infection. EXAM: RIGHT LOWER EXTREMITY VENOUS DOPPLER ULTRASOUND TECHNIQUE: Gray-scale sonography with compression, as well as color and duplex ultrasound, were performed to evaluate the deep venous system(s) from the level of the common femoral vein through the popliteal and proximal calf veins. COMPARISON:  12/10/2020 FINDINGS: VENOUS Normal compressibility of the common femoral, superficial femoral, and popliteal veins, as well as the visualized calf veins. Visualized portions of profunda femoral vein and great saphenous vein unremarkable. No filling defects to suggest DVT on grayscale or color Doppler imaging. Doppler waveforms show normal direction of venous flow, normal respiratory plasticity and response to augmentation. Limited views of the contralateral common femoral vein are unremarkable. OTHER None. Limitations: none IMPRESSION: No right lower extremity DVT. Electronically Signed   By: Aliene Lloyd M.D.   On: 03/07/2024 13:01   CT ANGIO HEAD NECK W WO CM Result Date: 03/07/2024 CLINICAL DATA:  Neuro deficit, concern for stroke, word-finding difficulty, difficulty naming objects. EXAM: CT ANGIOGRAPHY HEAD AND NECK WITH AND WITHOUT CONTRAST TECHNIQUE: Multidetector CT imaging of the head and neck was performed using the standard protocol during bolus administration of intravenous contrast.  Multiplanar CT image reconstructions and MIPs were obtained to evaluate the vascular anatomy. Carotid stenosis measurements (when applicable) are obtained utilizing NASCET criteria, using the distal internal  carotid diameter as the denominator. RADIATION DOSE REDUCTION: This exam was performed according to the departmental dose-optimization program which includes automated exposure control, adjustment of the mA and/or kV according to patient size and/or use of iterative reconstruction technique. CONTRAST:  60mL OMNIPAQUE  IOHEXOL  350 MG/ML SOLN COMPARISON:  CT head 08/05/2023. FINDINGS: CT HEAD FINDINGS Brain: No acute intracranial hemorrhage. No CT evidence of acute infarct. Nonspecific hypoattenuation in the periventricular and subcortical white matter favored to reflect chronic microvascular ischemic changes. Remote lacunar infarcts in the anterior right basal ganglia. No edema, mass effect, or midline shift. The basilar cisterns are patent. Ventricles: Ventricles are normal in size and configuration. Vascular: No hyperdense vessel. Skull: No acute or aggressive finding. Sinuses/orbits: Bilateral lens replacement. Orbits otherwise unremarkable. Other: Mastoid air cells are clear. CTA NECK FINDINGS Aortic arch: Standard configuration of the aortic arch. Imaged portion shows no evidence of aneurysm or dissection. No significant stenosis of the major arch vessel origins. Pulmonary arteries: As permitted by contrast timing, there are no filling defects in the visualized pulmonary arteries. Subclavian arteries: The subclavian arteries are patent bilaterally. Right carotid system: No evidence of dissection, stenosis (50% or greater), or occlusion. Minimal atherosclerosis at the carotid bifurcation. Tortuosity of the cervical ICA. Left carotid system: No evidence of dissection, stenosis (50% or greater), or occlusion. Minimal atherosclerosis at the carotid bifurcation. Tortuosity of the cervical ICA. Vertebral arteries:  Codominant. No evidence of dissection, stenosis (50% or greater), or occlusion. Tortuosity of the bilateral V2 and V3 segments. Skeleton: No acute findings. Degenerative changes in the cervical spine. Multiple anterior endplate osteophytes in the cervical spine. Edentulous maxilla. Anterolisthesis of C7 on T1. Other neck: The visualized airway is patent. No cervical lymphadenopathy. Upper chest: No acute abnormality in the lung apices. Multiple calcified mediastinal lymph nodes again noted. Review of the MIP images confirms the above findings CTA HEAD FINDINGS ANTERIOR CIRCULATION: The intracranial ICAs are patent bilaterally. Mild atherosclerosis of the carotid siphons. No significant stenosis, proximal occlusion, aneurysm, or vascular malformation. MCAs: The middle cerebral arteries are patent bilaterally. ACAs: The anterior cerebral arteries are patent bilaterally. POSTERIOR CIRCULATION: No significant stenosis, proximal occlusion, aneurysm, or vascular malformation. PCAs: The posterior cerebral arteries are patent bilaterally. Pcomm: Visualized on the right. SCAs: The superior cerebellar arteries are patent bilaterally. Basilar artery: Patent AICAs: Patent PICAs: Patent Vertebral arteries: The intracranial vertebral arteries are patent. Venous sinuses: As permitted by contrast timing, patent. Anatomic variants: None Review of the MIP images confirms the above findings IMPRESSION: No large vessel occlusion. No high-grade stenosis, aneurysm, or dissection of the arteries in the head and neck. Tortuosity of the cervical internal carotid arteries and vertebral arteries in the neck which may be related to atherosclerosis. No CT evidence of acute intracranial abnormality. Chronic microvascular ischemic changes. Remote infarcts in the right basal ganglia. Electronically Signed   By: Donnice Mania M.D.   On: 03/07/2024 12:49   DG Chest Portable 1 View Result Date: 03/07/2024 CLINICAL DATA:  AMS, confusion, rule out  occult infection EXAM: PORTABLE CHEST - 1 VIEW COMPARISON:  August 06, 2023 FINDINGS: Streaky atelectasis in the right lung base. No focal airspace consolidation, pleural effusion, or pneumothorax. Mild cardiomegaly. Tortuous aorta with aortic atherosclerosis.No acute fracture or destructive lesion. Multilevel thoracic osteophytosis. IMPRESSION: Streaky right basilar atelectasis. Otherwise, no acute cardiopulmonary abnormality. Electronically Signed   By: Rogelia Myers M.D.   On: 03/07/2024 11:49     Procedures   Medications Ordered in the ED  iohexol  (OMNIPAQUE ) 350  MG/ML injection 60 mL (60 mLs Intravenous Contrast Given 03/07/24 1216)                                    Medical Decision Making Amount and/or Complexity of Data Reviewed Labs: ordered. Radiology: ordered.  Risk Prescription drug management.    [name] is a 68 y.o. male with a past medical history significant for hypertension, sarcoidosis, CKD, hyperlipidemia, previous pulmonary embolism, CHF, kidney stones, and previous stroke who presents with speech difficulties and confusion.  According to patient's family, about a week and a half ago patient several days of confusion so she stopped his gabapentin  thinking that might help.  He reportedly did better for a few days but then since Saturday, 5 days ago, patient has had difficulty with word finding and with his speech with confusion.  Patient is acting confused and has wandered away from the house at times.  He is having slower speech although there is not dysarthria per family.  This is more that he can get out what he wants to say.  She is unsure what his previous stroke symptoms were but he does not have any numbness or weakness in extremities.  Denies dizziness.  Denies vision changes.  Denies any head trauma or falls.  Denies any seizures.  Reports no other medication changes.  Family reports he may have acted this way when he has had infection in the past but he  is denying any cough, congestion, chest pain, shortness of breath, nausea, vomiting, constipation, diarrhea, or dysuria although they do report the patient has had more urgency and frequency which is different.  Patient denying other complaints.  On exam, patient is slow to speak but I do not appreciate dysarthria.  He has intact sensation and strength in extremities and normal finger-nose-finger testing.  Pupils are symmetric and reactive with normal extraocular movements.  Lungs are clear and chest is nontender.  Abdomen nontender.  Patient moving all extremities.  Patient does have some bruising in his right calf with some soreness and swelling and reports he does a history of DVT but is not on blood thinners.  No back tenderness or neck tenderness.  Patient otherwise resting.  Clinically I am concerned about either occult infection or urinary tract infection causing recrudescence or some other neurologic encephalopathy or symptoms.  He is having no neck pain or headache, doubt meningitis or infectious encephalitis at this time.  Will hold on LP.  Will get screening labs including chest x-ray and urinalysis, will get ultrasound of the right leg given his bruising and history of DVT, and will get CT of the head and neck given the speech difficulties as I am concerned about stroke.  Anticipate discussion with neurology and given the last 5 days of symptoms, patient may end up needing transfer for MRI which we do not have this facility today.  Anticipate reassessment for workup to determine disposition.       11:56 AM Still waiting for workup results.  I spoke to neurology who felt that if the imaging was negative patient would need MRI brain with and without contrast given his sarcoid history.  Anticipate reassessment after workup and likely will need transfer for MRI to further determine disposition.  CTA did not show clear evidence of acute stroke.  Patient be transferred ED to ED to get MRI to  rule out stroke.  If MRI reassuring,  dissipate may be stable for discharge home.  Care accepted by Dr. Emil for ED to ED transfer to get the MRI today.     Final diagnoses:  Aphasia    Clinical Impression: 1. Aphasia     Disposition: ED to ED transfer accepted by Dr. Emil to get MRI to rule out acute stroke as the cause of the aphasia and confusion.  This note was prepared with assistance of Conservation officer, historic buildings. Occasional wrong-word or sound-a-like substitutions may have occurred due to the inherent limitations of voice recognition software.     Jason Hauge, Lonni PARAS, MD 03/07/24 1425

## 2024-03-07 NOTE — ED Notes (Signed)
 Staffing called inquiring about a sitter; staffing advised no sitter available.

## 2024-03-07 NOTE — ED Notes (Signed)
 PT aware of where, when and how to get to the [facility] ED... Pt AO x4.SABRASABRA

## 2024-03-07 NOTE — ED Notes (Signed)
 Pt warm to the touch.SABRASABRA

## 2024-03-07 NOTE — ED Provider Notes (Signed)
 Patient seen after prior EDP.  Case discussed with Dr. Vanessa, neurology.  No further neurologic workup indicated.  Case discussed with Dr. Tobie, hospitalist.  No clear indication for medical admission.    Given patient's change from baseline will hold in the ED for safety.  Will plan on Geri-psych evaluation.  Final disposition dependent upon [facility] psych plan of care.  Patient is medically clear for pending psychiatric evaluation.  Final disposition is dependent on psychiatric plan of care.     Laurice Maude BROCKS, MD 03/07/24 [id number]    Laurice Maude BROCKS, MD 03/08/24 [phone]

## 2024-03-07 NOTE — ED Notes (Addendum)
 Patient has decided to go POV to [facility] request

## 2024-03-07 NOTE — ED Triage Notes (Signed)
 Pt alert, NAD with family member who initially states s/s x3 days, but then reported since Sat pt has had trouble finding words, wandering, naming objects.

## 2024-03-07 NOTE — ED Notes (Signed)
 Pt continues to wander the unit with his wife. EDP notified

## 2024-03-07 NOTE — ED Notes (Signed)
 Pt attempted to leave and states I'm going home. Pt redirected back to bed. Pt wife at bedside.

## 2024-03-07 NOTE — Consult Note (Signed)
 Initial Consultation Note   [name] FMW:[id number] DOB: [birth year] PCP: Onita Rush, MD DOA: 03/07/2024 DOS: the patient was seen and examined on 03/07/2024 Primary service: Laurice Maude BROCKS, MD  Referring physician: Dr. Laurice Reason for consult: Altered mental status/confusion  Assessment and Plan: Altered mental status/confusion: Unclear etiology of persistent confusion over the last 3 days.  He has had thorough workup with imaging negative for CVA or other intracranial etiology.  Labs are largely reassuring including UA negative for UTI.  CXR does not show evidence of pneumonia or pulmonary edema.  He has not had a fever or other obvious evidence of infectious process.  EDP has also discussed the case with neurology who did not feel there was any further workup recommended from their perspective. - Agree with safety sitter and holding overnight for Geripsych evaluation - If develops fever would recommend obtaining blood cultures and viral panels - Otherwise no indication for ongoing inpatient medical management  Chronic HFpEF: proBNP elevated however appears euvolemic on admission.  No peripheral edema or evidence of pulmonary edema on CXR.  Okay to resume home meds.  CKD stage IIIb: Creatinine slightly increased at 1.98 compared to baseline which appears to mostly run 1.6-1.8.  This is not significant enough to explain change in mentation.  Hypertension: BP is stable, can resume home meds. Hyperlipidemia: Continue statin. COPD: Stable, lungs clear on exam. History of CVA: Continue aspirin , statin.  History of MSSA bacteremia/endocarditis: Prior history of MSSA bacteremia/endocarditis which was felt secondary to pneumonia.  He completed appropriate antibiotics.  Repeat TEE in January did not show evidence of persistent vegetation.  He has been afebrile and without leukocytosis.  No sign of active infection at this point.  TRH will sign off at present, please call us  again  when needed.  HPI: [name] is a 68 y.o. male with medical history significant for chronic HFpEF, nonobstructive CAD, history of CVA, CKD stage IIIb, HTN, HLD, history of PE (completed Xarelto  treatment), COPD, AAA, history of MSSA bacteremia/endocarditis, OSA intolerant to CPAP who presented to the ED for evaluation of altered mental status.  History is limited from patient otherwise supplemented by EDP, chart review, and spouse at bedside.  Per spouse, patient has had significant change in mentation ongoing for the last 3 days.  Normally is fully alert and oriented at baseline.  Over the last 3 days he has had notable issues with trouble finding words or naming objects.  He has been wandering.    Spouse states that he was started on gabapentin  to take as needed a few weeks ago.  He only took a few doses and has not taken any the last 2-1/2 weeks.  He has a chronic mild cough which he says is unchanged from baseline.  No nausea, vomiting, diarrhea, subjective fevers, chills, diaphoresis at home.  Has not had any obvious dyspnea or abdominal pain.  She reports that it seems like he has had increased urinary frequency but otherwise has not complained about dysuria.  She reports she has had some similar episodes in the past however at those times there was obvious causes such as infection.  Patient is awake, alert, oriented to self and place.  He recognizes his spouse at bedside.  He states the year is 2017.  ED course: Patient initially presented to [facility] before transfer to [facility] via personal vehicle.  Initial vitals showed BP 142/96, pulse 65, RR 16, temp 100.0 F rectally, SpO2 96% on room air.  Labs showed WBC 6.0, hemoglobin 11.8, platelets 219, sodium 142, potassium 3.7, bicarb 28, BUN 13, creatinine 1.98 (baseline 1.6-1.8), TSH 1.020, troponin I 12, ammonia 16, proBNP 2372.  Portable chest x-ray showed streaky right basilar atelectasis otherwise no focal consolidation,  edema, effusion, or pneumothorax.  CTA head/neck negative for LVO.  No high-grade stenosis, aneurysm, or dissection of the arteries in the head and neck.  No CT evidence of acute intracranial abnormality.  Chronic microvascular ischemic changes and remote infarcts in the right basal ganglia noted.  RLE venous ultrasound negative for DVT.  MRI brain without contrast was a limited study without evidence of acute or recent infarction.  Moderately advanced cerebral white matter disease noted.  Patient received IV Ativan  1 mg to undergo MRI.  EDP discussed case with neurology who did not feel there was further workup indicated from their perspective.  Hospitalist service was asked to consult for further recommendations.  Review of Systems: unable to review all systems due to the inability of the patient to answer questions. 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   ED (erectile dysfunction)    GERD (gastroesophageal reflux disease)    Hypercalcemia    Hyperlipidemia    Hypertension    Insomnia    Long-term use of high-risk medication    Nephrocalcinosis    Nephrolithiasis    Obesity    Osteopenia    Pancreatitis    admitted 03/19/01-06/05/01   PE (pulmonary embolism) 01/30/2013   Proteinuria    Pulmonary embolism (HCC) 01/30/2013   Sarcoidosis    Sleep apnea    Smoker    Stroke [facility])    Past Surgical History:  Procedure Laterality Date   ABDOMINAL EXPLORATION SURGERY     BACK SURGERY     CHOLECYSTECTOMY     RIGHT/LEFT HEART CATH AND CORONARY ANGIOGRAPHY N/A  05/12/2021   Procedure: RIGHT/LEFT HEART CATH AND CORONARY ANGIOGRAPHY;  Surgeon: Elmira Newman PARAS, MD;  Location: [facility];  Service: Cardiovascular;  Laterality: N/A;   SHOULDER ARTHROSCOPY Right 10/30/2020   Procedure: ARTHROSCOPY SHOULDER WITH EXTENSIVE DEBRIDEMENT;  Surgeon: Vernetta Lonni GRADE, MD;  Location: [facility];  Service: Orthopedics;  Laterality: Right;   SHOULDER SURGERY     TRACHEOSTOMY     closed   TRANSESOPHAGEAL ECHOCARDIOGRAM (CATH LAB) N/A 08/09/2023   Procedure: TRANSESOPHAGEAL ECHOCARDIOGRAM;  Surgeon: Sheena Pugh, DO;  Location: [facility] CV [facility];  Service: Cardiovascular;  Laterality: N/A;   TRANSESOPHAGEAL ECHOCARDIOGRAM (CATH LAB) N/A 09/27/2023   Procedure: TRANSESOPHAGEAL ECHOCARDIOGRAM;  Surgeon: Jeffrie Oneil BROCKS, MD;  Location: [facility];  Service: Cardiovascular;  Laterality: N/A;   Social History:  reports that he quit smoking about 3 years ago. His smoking use included cigarettes. He started smoking about 18 years ago. He has a 3.9 pack-year smoking history. He has never used smokeless tobacco. He reports that he does not drink alcohol and does not use drugs.  Allergies  Allergen Reactions   Codeine Nausea And Vomiting   Hydrocodone -Acetaminophen  Nausea And Vomiting  Hydromorphone  Other (See Comments)    hallucinations     Family History  Problem Relation Age of Onset   Hypertension Father    CVA Father    Lung cancer Father    Alzheimer's disease Mother    Hypertension Sister    Heart failure Sister     Prior to Admission medications   Medication Sig Start Date End Date Taking? Authorizing Provider  aspirin  EC 81 MG tablet Take 81 mg by mouth daily. Patients Wife gave him 4 tablets this am. 08/06/2023.   Yes [provider]  atorvastatin  (LIPITOR) 20 MG tablet Take 1 tablet (20 mg total) by mouth daily. 08/12/23 03/07/24 Yes Laurence Locus, DO  dapagliflozin  propanediol (FARXIGA ) 10 MG TABS tablet Take 1  tablet (10 mg total) by mouth daily. 12/07/23  Yes   diltiazem  (CARDIZEM  CD) 360 MG 24 hr capsule Take 1 capsule (360 mg total) by mouth daily. 12/02/23  Yes   ELDERBERRY PO Take 2 tablets by mouth daily. *gummy*   Yes [provider]  gabapentin  (NEURONTIN ) 300 MG capsule Take one capsule (300 mg dose) by mouth 3 (three) times a day. 02/14/24  Yes   hydrALAZINE  (APRESOLINE ) 100 MG tablet Take 1 tablet (100 mg total) by mouth 3 (three) times daily. 02/06/24  Yes Wyn Jackee VEAR Mickey., NP  ipratropium-albuterol  (DUONEB) 0.5-2.5 (3) MG/3ML SOLN Take 3 mLs by nebulization every 6 (six) hours as needed (SOB/Wheezing).   Yes [provider]  isosorbide  mononitrate (IMDUR ) 30 MG 24 hr tablet Take 3 tablets (90 mg total) by mouth daily. 01/02/24  Yes   lidocaine  (LIDODERM ) 5 % Place 1 patch onto the skin for 12 (twelve) hours as directed as needed. (1 patch on for 12 hours then off for 12 hours) 01/20/24  Yes   metoprolol  (TOPROL -XL) 200 MG 24 hr tablet Take 1 tablet (200 mg total) by mouth daily. 08/12/23 03/07/24 Yes Laurence Locus, DO  Omega-3 Fatty Acids (FISH OIL PO) Take 1 tablet by mouth daily.   Yes [provider]  tamsulosin  (FLOMAX ) 0.4 MG CAPS capsule Take 1 capsule (0.4 mg total) by mouth daily. 11/17/23  Yes   timolol  (TIMOPTIC ) 0.5 % ophthalmic solution Place 1 drop into the left eye 2 (two) times daily. 01/25/24  Yes   traZODone  (DESYREL ) 150 MG tablet Take 1 tablet (150 mg total) by mouth at bedtime. 12/07/23  Yes   azithromycin  (ZITHROMAX ) 250 MG tablet Take 2 tablets by mouth today and then take 1 tablet by mouth daily for 4 more days. Patient not taking: Reported on 03/07/2024 02/06/24     predniSONE  (DELTASONE ) 20 MG tablet Take 1 tablet (20 mg total) by mouth daily with food for 3 days as directed Patient not taking: Reported on 03/07/2024 01/20/24     predniSONE  (STERAPRED UNI-PAK 21 TAB) 10 MG (21) TBPK tablet Tapering pack, take as directed for 6 days Patient not taking: Reported on  03/07/2024 01/24/24     triamcinolone  cream (KENALOG ) 0.1 % Apply to affected areas twice a day as needed for itching/inflammation. Patient not taking: Reported on 09/19/2023 09/09/21       Physical Exam: Vitals:   03/07/24 1400 03/07/24 1452 03/07/24 1803 03/07/24 2203  BP: (!) 139/99 (!) 177/108 (!) 155/101 (!) 136/96  Pulse: (!) 57 62 63   Resp: 20 16 16 14   Temp:  97.8 F (36.6 C) 98.4 F (36.9 C)   TempSrc:   Oral   SpO2: 97% 93% 97% 97%  General: resting in bed, no acute distress HEENT:  EOMI, no scleral icterus Cardiac: RRR, no rubs, murmurs or gallops Pulm: clear to auscultation bilaterally, moving normal volumes of air Abd: soft, nontender, nondistended, BS present Ext: warm and well perfused, no pedal edema Neuro: alert and oriented to self, place, but not year or situation.  Recognizes spouse at bedside.  Following commands appropriately.  Data Reviewed:   There are no new results to review at this time.    Family Communication: Spouse at bedside Primary team communication: Discussed with Dr. Laurice Thank you very much for involving us  in the care of your patient.  Author: Jorie Blanch, MD 03/07/2024 11:46 PM  For on call review [email or web address].

## 2024-03-08 LAB — BASIC METABOLIC PANEL WITH GFR
Anion gap: 8 (ref 5–15)
BUN: 13 mg/dL (ref 8–23)
CO2: 24 mmol/L (ref 22–32)
Calcium: 9 mg/dL (ref 8.9–10.3)
Chloride: 105 mmol/L (ref 98–111)
Creatinine, Ser: 2.1 mg/dL — ABNORMAL HIGH (ref 0.61–1.24)
GFR, Estimated: 34 mL/min — ABNORMAL LOW (ref >60)
Glucose, Bld: 152 mg/dL — ABNORMAL HIGH (ref 70–99)
Potassium: 3.2 mmol/L — ABNORMAL LOW (ref 3.5–5.1)
Sodium: 137 mmol/L (ref 135–145)

## 2024-03-08 LAB — PROCALCITONIN: Procalcitonin: <0.1 ng/mL

## 2024-03-08 LAB — SEDIMENTATION RATE: Sed Rate: 15 mm/h (ref 0–16)

## 2024-03-08 LAB — RPR: RPR Ser Ql: NONREACTIVE

## 2024-03-08 MED ORDER — LACTATED RINGERS IV BOLUS: 1000.0000 mL | Freq: Once | INTRAVENOUS | Status: DC

## 2024-03-08 MED ORDER — POTASSIUM CHLORIDE 20 MEQ PO PACK
40.0000 meq | PACK | Freq: Two times a day (BID) | ORAL | Status: DC
  Administered 2024-03-08: 40 meq via ORAL
  Filled 2024-03-08: qty 2

## 2024-03-08 MED ORDER — TRAZODONE HCL 50 MG PO TABS
150.0000 mg | ORAL_TABLET | Freq: Every day | ORAL | Status: DC
  Administered 2024-03-08: 150 mg via ORAL
  Filled 2024-03-08: qty 3

## 2024-03-08 MED ORDER — LACTATED RINGERS IV BOLUS
1000.0000 mL | Freq: Once | INTRAVENOUS | Status: AC
  Administered 2024-03-08: 1000 mL via INTRAVENOUS

## 2024-03-08 NOTE — ED Notes (Addendum)
 Pt changed into paper scrubs and non-skid socks. Pt belongings inventoried and placed in locker 4 in purple, pt wanded by security.

## 2024-03-08 NOTE — ED Provider Notes (Addendum)
 Pt boarding in ED, currently in H15. It appears patient is currently awaiting BH eval.   Chart reviewed - pts current presentation does not appear c/w an acute behavioral health illness.  Discussed pt with [facility] NP - agrees that it does not appear patient would qualify for/benefit from acute psych eval/admission.   Pt is alert, oriented to person, place, location. Spouse reports patient being very close to baseline. She indicates in past, multiple prior episodes of forgetfulness, memory issues, mild disorientation - she indicates often those prior times were associated with acute illness or infection. Pts temp yesterday was mildly above normal. Wbc normal. Pt denies specific new symptoms or source of infection. No headache. No neck or back pain. No focal extremity pain or swelling. No sinus pain, cough, ear pain, sore throat or any uri symptoms. No chest pain or sob. No abd pain or nvd. No dysuria or gu c/o. No rash/skin lesions.  On exam, no neck stiffness or rigidity, chest cta. Abd soft nt. No extremity pain, swelling, redness or lesions. Will repeat/add some labs this AM.  If labs unremarkable, and no new symptoms, it appears pt is trending in positive direction towards potential d/c.  On repeat labs, pt with ckd, cr mildly higher than prior, LR bolus, po fluids/food. Pt has ate well in ED, normal appetite. No fever, chills or sweats, remains afebrile > 24 hours with constitutional symptoms. Mri neg for cva. Neurology previously consulted and not recommending additional acute hospital workup. Medicine/hospitalists previously consulted and not recommending inpatient tx.    Currently it appears patient is stable for ED d/c, with close outpatient pcp and neurology f/u. Patient indicates feels ready for d/c. Return precautions provided.        Bernard Drivers, MD 03/08/24 [phone]

## 2024-03-08 NOTE — Consult Note (Signed)
 Spoke with primary EDP team after review of consultation and chart.  Per primary EDP team, plan is to repeat/add additional laboratory studies, and to reconsult psychiatry if/when needed.  Will close out consultation at this time.

## 2024-03-08 NOTE — Discharge Instructions (Addendum)
 It was our pleasure to provide your ER care today - we hope that you feel better.  Drink plenty of fluids/stay well hydrated.   Follow up closely with primary care doctor in the next 3-4 days - discuss with them coordinating follow up and outpatient evaluation by neurology.  We have made a referral to outpatient neurology follow up - if you have not heard from the office by Monday afternoon, call for appointment - at follow up, discuss possible neurocognitive evaluation/testing.   From today's labs, your potassium level is mildly low - eat plenty of fruits and vegetables, and follow up with primary care doctor in the coming week. Also have them recheck your kidney function tests then as they are slightly increased above baseline.   Return to ER right away if worse, new symptoms, fevers, new pain, chest pain,  trouble breathing, abdominal pain, weak/fainting, or other concern.

## 2024-03-08 NOTE — ED Provider Notes (Signed)
 Emergency Medicine Observation Re-evaluation Note  [name] is a 68 y.o. male, seen on rounds today.  Pt initially presented to the ED for complaints of Aphasia Currently, the patient is calm and cooperative.  Physical Exam  BP (!) 151/85   Pulse (!) 54   Temp 98.1 F (36.7 C) (Oral)   Resp 16   SpO2 97%  Physical Exam General: Awake. Alert. No acute distress Cardiac: Regular rate rhythm Lungs: Clear to auscultation bilaterally Psych: Calm and cooperative ED Course / MDM  EKG:EKG Interpretation Date/Time:  Wednesday March 07 2024 11:21:02 EDT Ventricular Rate:  66 PR Interval:  183 QRS Duration:  109 QT Interval:  440 QTC Calculation: 461 R Axis:   -20  Text Interpretation: Sinus rhythm Incomplete RBBB and LAFB RSR' in V1 or V2, probably normal variant Nonspecific T abnormalities, lateral leads wen compared to prior, more wandering baseline and t wave inversion in leads V4-V6 No STEMI Confirmed by Ginger Barefoot ([id number]) on 03/07/2024 11:28:53 AM  I have reviewed the labs performed to date as well as medications administered while in observation.  Recent changes in the last 24 hours include: Patient was evaluated in the ED last night.  No indication for medical admission at that time.  He was held here overnight awaiting geriatric psych evaluation.  Plan  Current plan is for continued boarding in the ED awaiting geriatric psych evaluation and disposition per psych team.    Pamella Ozell LABOR, DO 03/08/24 [phone]

## 2024-03-08 NOTE — BH Assessment (Addendum)
@   0115 TTS clinician attempted to complete assessment. Dena, RN, patient too confused unable to engage in assessment at this time. TTS will complete once patient is alert oriented and able to participate.

## 2024-03-10 ENCOUNTER — Other Ambulatory Visit (HOSPITAL_COMMUNITY): Payer: Self-pay

## 2024-03-13 LAB — CULTURE, BLOOD (ROUTINE X 2)
Culture: NO GROWTH
Culture: NO GROWTH

## 2024-03-16 DIAGNOSIS — I2510 Atherosclerotic heart disease of native coronary artery without angina pectoris: Secondary | ICD-10-CM | POA: Diagnosis not present

## 2024-03-16 DIAGNOSIS — G3184 Mild cognitive impairment, so stated: Secondary | ICD-10-CM | POA: Diagnosis not present

## 2024-03-16 DIAGNOSIS — I130 Hypertensive heart and chronic kidney disease with heart failure and stage 1 through stage 4 chronic kidney disease, or unspecified chronic kidney disease: Secondary | ICD-10-CM | POA: Diagnosis not present

## 2024-03-16 DIAGNOSIS — D869 Sarcoidosis, unspecified: Secondary | ICD-10-CM | POA: Diagnosis not present

## 2024-03-16 DIAGNOSIS — N1832 Chronic kidney disease, stage 3b: Secondary | ICD-10-CM | POA: Diagnosis not present

## 2024-03-16 DIAGNOSIS — R4182 Altered mental status, unspecified: Secondary | ICD-10-CM | POA: Diagnosis not present

## 2024-03-16 DIAGNOSIS — Z86711 Personal history of pulmonary embolism: Secondary | ICD-10-CM | POA: Diagnosis not present

## 2024-03-16 DIAGNOSIS — F17210 Nicotine dependence, cigarettes, uncomplicated: Secondary | ICD-10-CM | POA: Diagnosis not present

## 2024-03-16 DIAGNOSIS — I5032 Chronic diastolic (congestive) heart failure: Secondary | ICD-10-CM | POA: Diagnosis not present

## 2024-03-17 DIAGNOSIS — J9601 Acute respiratory failure with hypoxia: Secondary | ICD-10-CM | POA: Diagnosis not present

## 2024-03-17 DIAGNOSIS — B348 Other viral infections of unspecified site: Secondary | ICD-10-CM | POA: Diagnosis not present

## 2024-03-17 DIAGNOSIS — I2510 Atherosclerotic heart disease of native coronary artery without angina pectoris: Secondary | ICD-10-CM | POA: Diagnosis not present

## 2024-03-17 DIAGNOSIS — J449 Chronic obstructive pulmonary disease, unspecified: Secondary | ICD-10-CM | POA: Diagnosis not present

## 2024-03-19 ENCOUNTER — Ambulatory Visit: Admitting: Neurology

## 2024-03-19 ENCOUNTER — Encounter: Payer: Self-pay | Admitting: Neurology

## 2024-03-19 VITALS — BP 127/86 | HR 51 | Ht 72.0 in | Wt 218.4 lb

## 2024-03-19 DIAGNOSIS — Z86711 Personal history of pulmonary embolism: Secondary | ICD-10-CM

## 2024-03-19 DIAGNOSIS — N1832 Chronic kidney disease, stage 3b: Secondary | ICD-10-CM | POA: Diagnosis not present

## 2024-03-19 DIAGNOSIS — D869 Sarcoidosis, unspecified: Secondary | ICD-10-CM | POA: Diagnosis not present

## 2024-03-19 DIAGNOSIS — I6782 Cerebral ischemia: Secondary | ICD-10-CM | POA: Diagnosis not present

## 2024-03-19 DIAGNOSIS — R404 Transient alteration of awareness: Secondary | ICD-10-CM

## 2024-03-19 DIAGNOSIS — I5032 Chronic diastolic (congestive) heart failure: Secondary | ICD-10-CM

## 2024-03-19 NOTE — Patient Instructions (Addendum)
 Problems With Thinking and Memory (Mild Neurocognitive Disorder): What to Know Mild neurocognitive disorder, formerly known as mild cognitive impairment, is a disorder where your memory doesn't work as well as it should. It may also affect other mental abilities like thinking, communicating, behavior, and being able to finish tasks. These problems can be noticed and measured. But they usually don't stop you from doing daily activities or living on your own. Mild neurocognitive disorder usually happens after 67 years of age. But it can also happen at younger ages. It's not as serious as major neurocognitive disorder, also known as dementia, but it may be the first sign of it. In general, the symptoms of this condition get worse over time. In rare cases, symptoms can get better. What are the causes? This condition may be caused by: Brain disorders like Alzheimer's disease, Parkinson's disease, and other conditions that slowly damage nerve cells. Diseases that affect the blood vessels in the brain and cause small strokes. Certain infections, like HIV. Traumatic brain injury. Other medical conditions, such as brain tumors, underactive thyroid  (hypothyroidism), and not having enough vitamin B12. Using certain drugs or medicines. What increases the risk? Being older than 67 years of age. Being male. Having a lower level of education. Diabetes, high blood pressure, high cholesterol, and other conditions that raise the risk for blood vessel diseases. Untreated or undertreated sleep apnea. Having a certain type of gene that can be inherited, or passed down from parent to child. Long-term health problems like heart disease, lung disease, liver disease, kidney disease, or depression. What are the signs or symptoms? Trouble remembering things. You may: Forget names, phone numbers, or details of recent events. Forget about social events and appointments. Often forget where you put your car keys or other  items. Trouble thinking and solving problems. You may have trouble with complex tasks like: Paying bills. Driving in places you don't know well. Trouble communicating. You may have trouble: Finding the right word or naming an object. Forming a sentence that makes sense. Understanding what you read or hear. Changes in your behavior or personality. When this happens, you may: Lose interest in the things you used to enjoy. Avoid being around people. Get angry more easily than usual. Act before thinking. How is this diagnosed? This condition is diagnosed based on: Your symptoms. Your health care provider may ask you and the people you spend time with, like family and friends, about your symptoms. Memory tests and other tests to check how your brain is working. Your provider may refer you to a provider called a neurologist or a mental health specialist. To try to find out the cause of your condition, your provider may: Get a detailed medical history. Ask about use of alcohol, drugs, and medicines. Do a physical exam. Order blood tests and brain imaging tests. How is this treated? Mild neurocognitive disorder that's caused by medicine use, drug use, infection, or another medical condition may get better when the cause is treated, or when medicines or drugs are stopped. If this disorder has another cause, it usually doesn't improve and may get worse. In these cases, the goal of treatment is to help you manage the symptoms. This may include: Medicines to help with memory and behavior symptoms. Talk therapy. This provides education, emotional support, memory aids, and other ways of making up for problems with mental tasks. Lifestyle changes. These may include: Getting regular exercise. Eating a healthy diet that includes omega-3 fatty acids. Doing things to challenge your thinking  and memory skills. Spending more time being with and talking to other people. Using routines like having regular  times for meals and going to bed. Follow these instructions at home: Eating and drinking  Drink more fluids as told. Eat a healthy diet that includes omega-3 fatty acids. These can be found in: Fish. Nuts. Leafy vegetables. Vegetable oils. If you drink alcohol: Limit how much you have to: 0-1 drink a day if you're male. 0-2 drinks a day if you're male. Know how much alcohol is in your drink. In the [place] one drink is one 12 oz bottle of beer (355 mL), one 5 oz glass of wine (148 mL), or one 1 oz glass of hard liquor (44 mL). Lifestyle  Get regular exercise as told by your provider. Do not smoke, vape, or use nicotine  or tobacco. Use healthy ways to manage stress. If you need help managing stress, ask your provider. Keep spending time with other people. Keep your mind active by doing activities you enjoy, like reading or playing games. Make sure you get good sleep at night. These tips can help: Try not to take naps during the day. Keep your bedroom dark and cool. Do not exercise in the few hours before you go to bed. Do not have foods or drinks with caffeine at night. General instructions Take medicines only as told. Your provider may tell you to avoid taking medicines that can affect thinking. These include some medicines for pain or sleeping. Work with your provider to find out: What things you need help with. What your safety needs are. Where to find more information [employer] on Aging: [email or web address] Contact a health care provider if: You have any new symptoms. Get help right away if: You have new confusion or your confusion gets worse. You act in ways that put you or your family in danger. This information is not intended to replace advice given to you by your health care provider. Make sure you discuss any questions you have with your health care provider. Document Revised: 02/15/2023 Document Reviewed: 02/15/2023 Elsevier Patient Education  2024 [employer]  Mindfulness-Based Stress Reduction Mindfulness-based stress reduction (MBSR) is a program that helps people learn to practice mindfulness. Mindfulness is the practice of consciously paying attention to the present moment. MBSR focuses on developing self-awareness, which lets you respond to life stress without judgment or negative feelings. It can be learned and practiced through techniques such as education, breathing exercises, meditation, and yoga. MBSR includes several mindfulness techniques in one program. MBSR works best when you understand the treatment, are willing to try new things, and can commit to spending time practicing what you learn. MBSR training may include learning about: How your feelings, thoughts, and reactions affect your body. New ways to respond to things that cause negative thoughts to start (triggers). How to notice your thoughts and let go of them. Practicing awareness of everyday things that you normally do without thinking. The techniques and goals of different types of meditation. What are the benefits of MBSR? MBSR can have many benefits, which include helping you to: Develop self-awareness. This means knowing and understanding yourself. Learn skills and attitudes that help you to take part in your own health care. Learn new ways to care for yourself. Be more accepting about how things are, and let things go. Be less judgmental and approach things with an open mind. Be patient with yourself and trust yourself more. MBSR has also been shown to: Reduce negative emotions, such  as sadness, overwhelm, and worry. Improve memory and focus. Change how you sense and react to pain. Boost your body's ability to fight infections. Help you connect better with other people. Improve your sense of well-being. How to practice mindfulness To do a basic awareness exercise: Find a comfortable place to sit. Pay attention to the present moment. Notice your thoughts,  feelings, and surroundings just as they are. Avoid judging yourself, your feelings, or your surroundings. Make note of any judgment that comes up and let it go. Your mind may wander, and that is okay. Make note of when your thoughts drift, and return your attention to the present moment. To do basic mindfulness meditation: Find a comfortable place to sit. This may include a stable chair or a firm floor cushion. Sit upright with your back straight. Let your arms fall next to your sides, with your hands resting on your legs. If you are sitting in a chair, rest your feet flat on the floor. If you are sitting on a cushion, cross your legs in front of you. Keep your head in a neutral position with your chin dropped slightly. Relax your jaw and rest the tip of your tongue on the roof of your mouth. Drop your gaze to the floor or close your eyes. Breathe normally and pay attention to your breath. Feel the air moving in and out of your nose. Feel your belly expanding and relaxing with each breath. Your mind may wander, and that is okay. Make note of when your thoughts drift, and return your attention to your breath. Avoid judging yourself, your feelings, or your surroundings. Make note of any judgment or feelings that come up, let them go, and bring your attention back to your breath. When you are ready, lift your gaze or open your eyes. Pay attention to how your body feels after the meditation. Follow these instructions at home:  Find a local in-person or online MBSR program. Set aside some time regularly for mindfulness practice. Practice every day if you can. Even 10 minutes of practice is helpful. Find a mindfulness practice that works best for you. This may include one or more of the following: Meditation. This involves focusing your mind on a certain thought or activity. Breathing awareness exercises. These help you to stay present by focusing on your breath. Body scan. For this practice, you lie  down and pay attention to each part of your body from head to toe. You can identify tension and soreness and consciously relax parts of your body. Yoga. Yoga involves stretching and breathing, and it can improve your ability to move and be flexible. It can also help you to test your body's limits, which can help you release stress. Mindful eating. This way of eating involves focusing on the taste, texture, color, and smell of each bite of food. This slows down eating and helps you feel full sooner. For this reason, it can be an important part of a weight loss plan. Find a podcast or recording that provides guidance for breathing awareness, body scan, or meditation exercises. You can listen to these any time when you have a free moment to rest without distractions. Follow your treatment plan as told by your health care provider. This may include taking regular medicines and making changes to your diet or lifestyle as recommended. Where to find more information You can find more information about MBSR from: Your health care provider. Community-based meditation centers or programs. Programs offered near you. Summary Mindfulness-based stress  reduction (MBSR) is a program that teaches you how to consciously pay attention to the present moment. It is used to help you deal better with daily stress, feelings, and pain. MBSR focuses on developing self-awareness, which allows you to respond to life stress without judgment or negative feelings. MBSR programs may involve learning different mindfulness practices, such as breathing exercises, meditation, yoga, body scan, or mindful eating. Find a mindfulness practice that works best for you, and set aside time for it on a regular basis. This information is not intended to replace advice given to you by your health care provider. Make sure you discuss any questions you have with your health care provider. Document Revised: 04/02/2021 Document Reviewed:  04/02/2021 Elsevier Patient Education  2024 [employer]   Total time for face to face interview and examination, for review of  images and laboratory testing, neurophysiology testing and pre-existing records, including out-of -network , was 45 minutes. 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 He had only one new medication , gabapentin  , and he started to take Neurontin   tid, but forgot some days. He denies taking any extra doses, but took  the drug irregular.   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    3) MRI showed temporal atrophy, widened sulcus and would be most commonly seen in AD. There is a strong family history of AD in his mother , MGM.  There has been a known history of vascular dementia affecting the patient's father.  There are many comorbidities that would also predispose this patient to having encephalopathy.      Plan:  Treatment plan and additional workup planned after today 56 includes:   1) I will order the dementia panel including ATN.  At 1 time we will need to repeat the MRI probably under conscious sedation.  So this would be a procedure done in the hospital for anesthesia reasons.  Without a valid image of the MRI of the brain will be difficult to further differentiate between several forms of cognitive impairment.  EEG  MRI under conscious sedation is a possibility , unless we can get directly to PET scan.  Aria rule out will require MRI.    If ATN positive, will follow with a PET scan amyloid.   Referral to neuropsychology.   He should not drive or operate machinery as long as we have not found an explanation for his confusion.   RV in 4 months.

## 2024-03-19 NOTE — Progress Notes (Addendum)
 [facility]  Provider:  Dr Briscoe Daniello Referring Provider: Bernard Drivers, MD ED     Primary Care Physician:  Manuel Rush, MD  Chief Complaint  Patient presents with   Memory Loss    Memory loss (?) acute changes in MS noticed  about 2  weeks ago ,  changes in mood, having issues with word finding or replacing words, had mistaken items for something else. MRI in ED was negative for stroke, showed white matter disease.    Aphasia    HPI:  [name] is a 68 y.o. male and seen here on 04/13/2024 upon referral from Dr. Bernard at the ED for a Consultation/ Evaluation of acute onset mental status changes .  I have met [name] previously the last time about 2 years ago  ( 03-10-2022 ) and at that time memory was not an issue we discussed . His visit was directed towards the treatment of sleep apnea and the diagnosis of sleep apnea.  It was followed with a sleep study   The patient was recently seen in the emergency room and from there referred here this time for a general neurology consult with altered mental status.  Dr. Garrick used the term aphasia  the inability to express words or express oneself with the right words  [name]  was described as  alert ,oriented to person, place, and location and his spouse felt that he was fairly close back to baseline but he had been appearing disoriented at home , he had mistaken some objects of daily living for others- for example looking at an eye glass case and taking it for  a Tv remote control.    He was agitated and restless while in the ED.   Neurology recommended MRI brain without contrast given sarcoidosis history. CKD 3 b. Chronic heart failure. Anemia. Glucosuria.    MRI demonstrated no evidence of acute or recent infarction. 2. Moderately advanced cerebral white matter disease.   He did not have physical complaints no pain he was not febrile there was no swelling and there was no headache.  No rash.  Creatinine had  been found to be elevated, he received a fluid bolus he ate while in the emergency room showed a normal appetite and the ability to eat without any help or assistance, and after 24 hours was basically ruled out to have a stroke neurology was consulted and the hospitalist did not find him sick enough to really recommend any inpatient therapies.  So we are still not sure what led to the patient's confusional spell.      The patient Hall seen us  for sleep clinic in the past. He Hall been dx with severe Sleep apnea in the past, did not want to use CPAP, did not tolerate CPAP .  04-2022.  Cc: Cardiologist  Dr [name], and Nephrologist Dr. Tobie, MD.   CLINICAL INFORMATION/HISTORY: 03-10-2022: Rv with [name], a 68 year old patient ,who reports not being able to use his CPAP at all, he can't sleep with CPAP, rejects any mask, he is drooling - he Hall not contacted the DME , Hall not brought the machine in today- no data to review- and he wants to discuss alternative treatment options.   Previous in-lab study from 04-29-2021 showed severe hypoxia -  The wake baseline 02 saturation was 92%, with the lowest being 78%. Time spent below 89% saturation equaled 89 minutes.  His hypoxia as seen in his sleep studies doesn't make  him a good condidate for  INSPIRE, but he lost weight - can he undergo a new HST to see what may have changed in his apnea at baseline?  I think yes. CPAP is for him no acceptable option- his SPLIT night PS 04-29-2021 was performed under a FFM- CPAP at 10 cm water reached an AHI of 0/h but patient needed an elevated head of bed.    Epworth sleepiness score:  13/24. FSS at 36/63 points. Was a night shift worker.   HST result :Calculated pAHI (per hour): 40.5/h.    severe OSA,  non positional apnea. HST device showed little hypoxia and no central events when sleeping reclined.                     REM pAHI:   60.5/h                                             NREM pAHI:   34.2/h O2 Saturation  Range (%):   Between 87 and 98% saturation with a mean saturation of 92%                                  O2 Saturation (minutes) <89%:     2.6 minutes      Social history HS graduate, associates degree in accounting.  Married, smoker,  drinks ETOH,  caffeine: daily sodas with caffeine.  Drinks tea , 1 every 2 weeks.       Review of Systems: Out of a complete 14 system review, the patient complains of only the following symptoms, and all other reviewed systems are negative.     08/24/2021    8:12 AM 12/22/2020   10:54 AM  MMSE - Mini Mental State Exam  Orientation to time 4 5  Orientation to Place 5 4  Registration 3 3  Attention/ Calculation 4 5  Recall 3 2  Language- name 2 objects 2 2  Language- repeat 1 1  Language- follow 3 step command 3 3  Language- read & follow direction 1 1  Write a sentence 1 1  Copy design 1 1  Total score 28 28        03/19/2024    3:43 PM 12/22/2020   10:49 AM  Montreal Cognitive Assessment   Visuospatial/ Executive (0/5) 2 1  Naming (0/3) 3 3  Attention: Read list of digits (0/2) 2 2  Attention: Read list of letters (0/1) 1 1  Attention: Serial 7 subtraction starting at 100 (0/3) 3 3  Language: Repeat phrase (0/2) 2 1  Language : Fluency (0/1) 1 0  Abstraction (0/2) 2 2  Delayed Recall (0/5) 4 1  Orientation (0/6) 4 5  Total 24 19      Social History   Socioeconomic History   Marital status: Married    Spouse name: [name]   Number of children: 2   Years of education: Not on file   Highest education level: Associate degree: academic program  Occupational History   Not on file  Tobacco Use   Smoking status: Former    Current packs/day: 0.00    Average packs/day: 0.3 packs/day for 15.5 years (3.9 ttl pk-yrs)    Types: Cigarettes    Start date: 09/06/2005    Quit date: 02/23/2021  Years since quitting: 3.1   Smokeless tobacco: Never  Vaping Use   Vaping status: Never Used  Substance and Sexual Activity   Alcohol  use: No    Alcohol/week: 0.0 standard drinks of alcohol   Drug use: No   Sexual activity: Yes  Other Topics Concern   Not on file  Social History Narrative   Not on file   Social [facility]   Financial Resource Strain: Low Risk  (01/24/2024)   [facility]   Overall Financial Resource Strain (CARDIA)    Difficulty of Paying Living Expenses: Not hard at all  Food Insecurity: No Food Insecurity (01/24/2024)   [facility]   Hunger Vital Sign    Within the past 12 months, you worried that your food would run out before you got the money to buy more.: Never true    Within the past 12 months, the food you bought just didn't last and you didn't have money to get more.: Never true  Transportation Needs: No Transportation Needs (01/24/2024)   Received from [facility] - Transportation    Lack of Transportation (Medical): No    Lack of Transportation (Non-Medical): No  Physical Activity: Not on file  Stress: Not on file  Social Connections: Unknown (01/14/2022)   [facility] from [facility]   Social Network    Social Network: Not on file  Intimate Partner Violence: Not At Risk (08/06/2023)   Humiliation, Afraid, Rape, and Kick questionnaire    Fear of Current or Ex-Partner: No    Emotionally Abused: No    Physically Abused: No    Sexually Abused: No    Family History  Problem Relation Age of Onset   Hypertension Father    CVA Father    Lung cancer Father    Alzheimer's disease Mother    Hypertension Sister    Heart failure Sister     Past Medical History:  Diagnosis Date   Acute combined systolic and diastolic congestive heart failure (HCC) 12/22/2020   Acute gouty arthritis 03/07/2017   Back pain    CHF (congestive heart failure) (HCC)    CKD (chronic kidney disease), stage III (HCC)    Closed nondisplaced fracture of first right metatarsal bone 06/23/2022   CVA (cerebral infarction)    Diastolic dysfunction    Dilation  of thoracic aorta (HCC)    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   ED (erectile dysfunction)    GERD (gastroesophageal reflux disease)    Hypercalcemia    Hyperlipidemia    Hypertension    Insomnia    Long-term use of high-risk medication    Nephrocalcinosis    Nephrolithiasis    Obesity    Osteopenia    Pancreatitis    admitted 03/19/01-06/05/01   PE (pulmonary embolism) 01/30/2013   Proteinuria    Pulmonary embolism (HCC) 01/30/2013   Sarcoidosis    Sleep apnea    Smoker    Stroke [facility])     Past Surgical History:  Procedure Laterality Date   ABDOMINAL EXPLORATION SURGERY     BACK SURGERY     CHOLECYSTECTOMY     RIGHT/LEFT HEART CATH AND CORONARY ANGIOGRAPHY N/A 05/12/2021   Procedure: RIGHT/LEFT HEART CATH AND CORONARY ANGIOGRAPHY;  Surgeon: Elmira Newman PARAS, MD;  Location: [facility] INVASIVE CV LAB;  Service: Cardiovascular;  Laterality: N/A;   SHOULDER ARTHROSCOPY Right 10/30/2020   Procedure: ARTHROSCOPY SHOULDER WITH EXTENSIVE DEBRIDEMENT;  Surgeon: Vernetta Lonni GRADE, MD;  Location: [facility];  Service: Orthopedics;  Laterality: Right;   SHOULDER SURGERY     TRACHEOSTOMY     closed   TRANSESOPHAGEAL ECHOCARDIOGRAM (CATH LAB) N/A 08/09/2023   Procedure: TRANSESOPHAGEAL ECHOCARDIOGRAM;  Surgeon: Sheena Pugh, DO;  Location: [facility] INVASIVE CV LAB;  Service: Cardiovascular;  Laterality: N/A;   TRANSESOPHAGEAL ECHOCARDIOGRAM (CATH LAB) N/A 09/27/2023   Procedure: TRANSESOPHAGEAL ECHOCARDIOGRAM;  Surgeon: Jeffrie Oneil BROCKS, MD;  Location: [facility] INVASIVE CV LAB;  Service: Cardiovascular;  Laterality: N/A;    Current Outpatient Medications  Medication Sig Dispense Refill   aspirin  EC 81 MG tablet Take 81 mg by mouth daily. Patients Wife gave him 4 tablets this am. 08/06/2023.     atorvastatin  (LIPITOR) 20 MG tablet Take 1 tablet (20 mg total) by mouth daily. 90 tablet 0   dapagliflozin  propanediol (FARXIGA ) 10 MG TABS tablet Take 1 tablet (10 mg total) by mouth  daily. 30 tablet 12   diltiazem  (CARDIZEM  CD) 360 MG 24 hr capsule Take 1 capsule (360 mg total) by mouth daily. 90 capsule 3   ELDERBERRY PO Take 2 tablets by mouth daily. *gummy*     hydrALAZINE  (APRESOLINE ) 100 MG tablet Take 1 tablet (100 mg total) by mouth 3 (three) times daily. 270 tablet 2   ipratropium-albuterol  (DUONEB) 0.5-2.5 (3) MG/3ML SOLN Take 3 mLs by nebulization every 6 (six) hours as needed (SOB/Wheezing).     isosorbide  mononitrate (IMDUR ) 30 MG 24 hr tablet Take 3 tablets (90 mg total) by mouth daily. 270 tablet 3   lidocaine  (LIDODERM ) 5 % Place 1 patch onto the skin for 12 (twelve) hours as directed as needed. (1 patch on for 12 hours then off for 12 hours) 10 patch 0   metoprolol  (TOPROL -XL) 200 MG 24 hr tablet Take 1 tablet (200 mg total) by mouth daily. 90 tablet 0   Omega-3 Fatty Acids (Manuel OIL PO) Take 1 tablet by mouth daily.     tamsulosin  (FLOMAX ) 0.4 MG CAPS capsule Take 1 capsule (0.4 mg total) by mouth daily. 30 capsule 6   traZODone  (DESYREL ) 150 MG tablet Take 1 tablet (150 mg total) by mouth at bedtime. 90 tablet 3   triamcinolone  cream (KENALOG ) 0.1 % Apply to affected areas twice a day as needed for itching/inflammation. 80 g 2   ALPRAZolam  (XANAX ) 0.5 MG tablet Take 1 tablet (0.5 mg total) by mouth at bedtime as needed for anxiety (take 1 tablet 30 min prior to procedure. may repeat additional tablet at time of vist (must have a driver)). 2 tablet 0   timolol  (TIMOPTIC ) 0.5 % ophthalmic solution Place 1 drop into the left eye 2 (two) times daily. 15 mL 3   No current facility-administered medications for this visit.    Allergies as of 03/19/2024 - Review Complete 03/19/2024  Allergen Reaction Noted   Codeine Nausea And Vomiting    Hydrocodone -acetaminophen  Nausea And Vomiting 06/05/2009   Hydromorphone  Other (See Comments) 09/24/2011    Vitals: BP 127/86   Pulse (!) 51   Ht 6' (1.829 m)   Wt 218 lb 6.4 oz (99.1 kg)   BMI 29.62 kg/m    Physical  exam:  General: The patient is awake, alert and appears not in acute distress.  The patient is well groomed. Head: Normocephalic, atraumatic.  Neck is supple.  Skin:  Without evidence of edema, or rash Trunk: sits erect.     Neurologic exam : The patient is awake and alert, oriented to place and time.  Memory subjective  described as intact.  His wife reports that he repeats himself, Hall asked what the word  fatigue  meant. He Hall been able to find the way to this office without navigation.   There is a normal attention span & concentration ability.  Speech is fluent without  dysarthria, dysphonia but Hall word- finding difficulties.   Cranial nerves: Pupils are equal and briskly reactive to light.  Funduscopic exam without evidence of pallor or edema.  Extraocular movements  in vertical and horizontal planes intact and without nystagmus. Visual fields by finger perimetry are intact. Hearing to finger rub intact.  Facial sensation intact to fine touch. Facial motor strength is symmetric and tongue and uvula move midline.  Motor exam:   Normal tone and normal muscle bulk and symmetric normal strength in all extremities. No cogwheeling  Hall had injuries to both rotator cuffs.  Grip Strength equal , Proximal strength of shoulder muscles  was reduced. .  Sensory:  Fine touch and vibration were tested .  Proprioception was tested in the upper extremities only and was normal.  Coordination: Rapid alternating movements in the fingers/hands were normal.  Finger-to-nose maneuver was tested and showed no evidence of ataxia, dysmetria or tremor.  Gait and station: Patient walked with/ without assistive device .  Core Strength within normal limits.  Stance is stable and of wide/ normal. Base.  Tandem gait is impaired , turns with 3.5  Steps - unfragmented.  Romberg testing ; mild swaying.    Deep tendon reflexes: in the  upper and lower extremities are symmetric . Babinski maneuver  response is  downgoing.   Assessment: Total time for face to face interview and examination, for review of  images and laboratory testing, neurophysiology testing and pre-existing records, including out-of -network , was 45 minutes. Assessment is as follows here:  1)  this Hall been one of many acute MS changes over the last 4 years, this time no trigger could be identified. He had only one new medication , gabapentin  , and he started to take Neurontin   tid, but forgot some days.  He denies taking any extra doses, but took  the drug into regularly.   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 SABRA    3) MRI showed temporal atrophy, widened sulcus and would be most commonly seen in AD. There is a strong family history of AD in his mother , MGM.  There Hall been a known history of vascular dementia affecting the patient's father.  There are many comorbidities that would also predispose this patient to having encephalopathy.      Plan:  Treatment plan and additional workup planned after today 55  min includes:   1) I will order the dementia panel including ATN.  At 1 time we will need to repeat the MRI probably under conscious sedation.  So this would be a procedure done in the hospital for anesthesia reasons.  Without a valid image of the MRI of the brain will be difficult to further differentiate between several forms of cognitive impairment.  EEG  MRI under conscious sedation is a possibility , unless we can get directly to PET scan.  Aria rule out will require MRI.    If ATN positive, will follow with a PET scan amyloid.  If amyloid part is negative, will use a PET scan metabolic .  Referral to neuropsychology-  .  He should not  drive or operate machinery as long as we have not found an explanation for his confusion.  Wife handles finances.  He endorsed ADL independence in all but 4 out of 15 categories.  He quit  smoking 2 weeks ago (!)   RV in 4 months.     The patient's condition requires frequent monitoring and adjustments in the treatment plan, reflecting the ongoing complexity of care.  This provider is the continuing focal point for all needed services for this condition.   Dedra Gores, MD  [facility] Board certified by The [employer] of Sleep Medicine and Diplomate of the [employer] of Sleep Medicine. Board certified In Neurology through the ABPN, Fellow of the [employer] of Neu[employer].

## 2024-03-20 ENCOUNTER — Ambulatory Visit: Payer: Self-pay | Admitting: Neurology

## 2024-03-21 ENCOUNTER — Encounter: Payer: Self-pay | Admitting: Psychology

## 2024-03-22 LAB — PROTEIN ELECTROPHORESIS, SERUM
A/G Ratio: 1.3 (ref 0.7–1.7)
Albumin ELP: 3.4 g/dL (ref 2.9–4.4)
Alpha 1: 0.2 g/dL (ref 0.0–0.4)
Alpha 2: 0.7 g/dL (ref 0.4–1.0)
Beta: 0.9 g/dL (ref 0.7–1.3)
Gamma Globulin: 1 g/dL (ref 0.4–1.8)
Globulin, Total: 2.7 g/dL (ref 2.2–3.9)

## 2024-03-22 LAB — ATN PROFILE
A -- Beta-amyloid 42/40 Ratio: 0.122 (ref >0.102)
Beta-amyloid 40: 313.55 pg/mL
Beta-amyloid 42: 38.38 pg/mL
N -- NfL, Plasma: 6.29 pg/mL — AB (ref 0.00–3.65)
T -- p-tau181: 2.64 pg/mL — AB (ref 0.00–0.97)

## 2024-03-22 LAB — CBC WITH DIFFERENTIAL/PLATELET
Basophils Absolute: 0.1 x10E3/uL (ref 0.0–0.2)
Basos: 1 %
EOS (ABSOLUTE): 0.7 x10E3/uL — ABNORMAL HIGH (ref 0.0–0.4)
Eos: 12 %
Hematocrit: 34.9 % — ABNORMAL LOW (ref 37.5–51.0)
Hemoglobin: 11.3 g/dL — ABNORMAL LOW (ref 13.0–17.7)
Immature Grans (Abs): 0 x10E3/uL (ref 0.0–0.1)
Immature Granulocytes: 0 %
Lymphocytes Absolute: 1.4 x10E3/uL (ref 0.7–3.1)
Lymphs: 24 %
MCH: 29.1 pg (ref 26.6–33.0)
MCHC: 32.4 g/dL (ref 31.5–35.7)
MCV: 90 fL (ref 79–97)
Monocytes Absolute: 0.5 x10E3/uL (ref 0.1–0.9)
Monocytes: 8 %
Neutrophils Absolute: 3.1 x10E3/uL (ref 1.4–7.0)
Neutrophils: 55 %
Platelets: 271 x10E3/uL (ref 150–450)
RBC: 3.88 x10E6/uL — ABNORMAL LOW (ref 4.14–5.80)
RDW: 13.7 % (ref 11.6–15.4)
WBC: 5.6 x10E3/uL (ref 3.4–10.8)

## 2024-03-22 LAB — TSH+FREE T4
Free T4: 1.1 ng/dL (ref 0.82–1.77)
TSH: 1.56 u[IU]/mL (ref 0.450–4.500)

## 2024-03-22 LAB — SEDIMENTATION RATE: Sed Rate: 9 mm/h (ref 0–30)

## 2024-03-22 LAB — COMPREHENSIVE METABOLIC PANEL WITH GFR
ALT: 15 IU/L (ref 0–44)
AST: 18 IU/L (ref 0–40)
Albumin: 3.9 g/dL (ref 3.9–4.9)
Alkaline Phosphatase: 80 IU/L (ref 44–121)
BUN/Creatinine Ratio: 8 — ABNORMAL LOW (ref 10–24)
BUN: 18 mg/dL (ref 8–27)
Bilirubin Total: 0.3 mg/dL (ref 0.0–1.2)
CO2: 24 mmol/L (ref 20–29)
Calcium: 9.5 mg/dL (ref 8.6–10.2)
Chloride: 107 mmol/L — ABNORMAL HIGH (ref 96–106)
Creatinine, Ser: 2.4 mg/dL — ABNORMAL HIGH (ref 0.76–1.27)
Globulin, Total: 2.2 g/dL (ref 1.5–4.5)
Glucose: 93 mg/dL (ref 70–99)
Potassium: 4.4 mmol/L (ref 3.5–5.2)
Sodium: 144 mmol/L (ref 134–144)
Total Protein: 6.1 g/dL (ref 6.0–8.5)
eGFR: 29 mL/min/1.73 — ABNORMAL LOW (ref >59)

## 2024-03-22 LAB — VITAMIN B12: Vitamin B-12: 489 pg/mL (ref 232–1245)

## 2024-03-22 LAB — METHYLMALONIC ACID, SERUM: Methylmalonic Acid: 311 nmol/L (ref 0–378)

## 2024-03-22 LAB — RPR: RPR Ser Ql: NONREACTIVE

## 2024-03-22 LAB — HIV ANTIBODY (ROUTINE TESTING W REFLEX): HIV Screen 4th Generation wRfx: NONREACTIVE

## 2024-03-22 LAB — ANA W/REFLEX: Anti Nuclear Antibody (ANA): NEGATIVE

## 2024-03-22 LAB — HEMOGLOBIN A1C
Est. average glucose Bld gHb Est-mCnc: 108 mg/dL
Hgb A1c MFr Bld: 5.4 % (ref 4.8–5.6)

## 2024-03-22 LAB — HOMOCYSTEINE: Homocysteine: 18.3 umol/L — ABNORMAL HIGH (ref 0.0–17.2)

## 2024-03-23 ENCOUNTER — Encounter: Payer: Self-pay | Admitting: Neurology

## 2024-03-23 ENCOUNTER — Other Ambulatory Visit (HOSPITAL_COMMUNITY): Payer: Self-pay

## 2024-03-23 ENCOUNTER — Other Ambulatory Visit: Payer: Self-pay

## 2024-03-29 ENCOUNTER — Ambulatory Visit: Admitting: Neurology

## 2024-03-29 DIAGNOSIS — R4182 Altered mental status, unspecified: Secondary | ICD-10-CM | POA: Diagnosis not present

## 2024-03-29 DIAGNOSIS — D869 Sarcoidosis, unspecified: Secondary | ICD-10-CM

## 2024-03-29 DIAGNOSIS — I6782 Cerebral ischemia: Secondary | ICD-10-CM

## 2024-03-29 DIAGNOSIS — N1832 Chronic kidney disease, stage 3b: Secondary | ICD-10-CM

## 2024-03-29 DIAGNOSIS — R404 Transient alteration of awareness: Secondary | ICD-10-CM

## 2024-03-29 DIAGNOSIS — Z86711 Personal history of pulmonary embolism: Secondary | ICD-10-CM

## 2024-03-29 DIAGNOSIS — I5032 Chronic diastolic (congestive) heart failure: Secondary | ICD-10-CM

## 2024-03-29 NOTE — Procedures (Signed)
    History:  68 year old man with transient alteration of awareness   EEG classification: Awake and drowsy  Duration: 27 minutes   Technical aspects: This EEG study was done with scalp electrodes positioned according to the 10-20 International system of electrode placement. Electrical activity was reviewed with band pass filter of 1-70Hz , sensitivity of 7 uV/mm, display speed of 33mm/sec with a 60Hz  notched filter applied as appropriate. EEG data were recorded continuously and digitally stored.   Description of the recording: The background rhythms of this recording consists of a fairly well modulated medium amplitude theta. Photic stimulation was performed, did not show any abnormalities. Hyperventilation was also performed, did not show any abnormalities. Drowsiness was manifested by background fragmentation. No abnormal epileptiform discharges seen during this recording. There was mild diffuse slowing. There were no electrographic seizure identified.   Abnormality: Mild diffuse slowing   Impression: This is an abnormal awake and drowsy EEG due to presence of mild diffuse slowing. This is consistent with a generalized brain dysfunction, nonspecific etiology.   Loring Liskey, MD [facility]

## 2024-04-02 DIAGNOSIS — R7301 Impaired fasting glucose: Secondary | ICD-10-CM | POA: Diagnosis not present

## 2024-04-02 DIAGNOSIS — E559 Vitamin D deficiency, unspecified: Secondary | ICD-10-CM | POA: Diagnosis not present

## 2024-04-02 DIAGNOSIS — I5032 Chronic diastolic (congestive) heart failure: Secondary | ICD-10-CM | POA: Diagnosis not present

## 2024-04-02 DIAGNOSIS — E785 Hyperlipidemia, unspecified: Secondary | ICD-10-CM | POA: Diagnosis not present

## 2024-04-02 DIAGNOSIS — Z125 Encounter for screening for malignant neoplasm of prostate: Secondary | ICD-10-CM | POA: Diagnosis not present

## 2024-04-02 DIAGNOSIS — N1832 Chronic kidney disease, stage 3b: Secondary | ICD-10-CM | POA: Diagnosis not present

## 2024-04-02 DIAGNOSIS — I130 Hypertensive heart and chronic kidney disease with heart failure and stage 1 through stage 4 chronic kidney disease, or unspecified chronic kidney disease: Secondary | ICD-10-CM | POA: Diagnosis not present

## 2024-04-04 ENCOUNTER — Other Ambulatory Visit: Payer: Self-pay | Admitting: Neurology

## 2024-04-04 DIAGNOSIS — G309 Alzheimer's disease, unspecified: Secondary | ICD-10-CM

## 2024-04-05 ENCOUNTER — Other Ambulatory Visit: Payer: Self-pay | Admitting: Neurology

## 2024-04-05 ENCOUNTER — Ambulatory Visit: Attending: Cardiology | Admitting: Cardiology

## 2024-04-05 ENCOUNTER — Telehealth: Payer: Self-pay

## 2024-04-05 ENCOUNTER — Encounter: Payer: Self-pay | Admitting: Cardiology

## 2024-04-05 VITALS — BP 140/88 | HR 60 | Resp 16 | Ht 72.0 in | Wt 214.4 lb

## 2024-04-05 DIAGNOSIS — I5032 Chronic diastolic (congestive) heart failure: Secondary | ICD-10-CM | POA: Diagnosis not present

## 2024-04-05 DIAGNOSIS — I4729 Other ventricular tachycardia: Secondary | ICD-10-CM

## 2024-04-05 DIAGNOSIS — E782 Mixed hyperlipidemia: Secondary | ICD-10-CM

## 2024-04-05 DIAGNOSIS — D860 Sarcoidosis of lung: Secondary | ICD-10-CM | POA: Diagnosis not present

## 2024-04-05 DIAGNOSIS — I10 Essential (primary) hypertension: Secondary | ICD-10-CM

## 2024-04-05 DIAGNOSIS — G4733 Obstructive sleep apnea (adult) (pediatric): Secondary | ICD-10-CM | POA: Diagnosis not present

## 2024-04-05 NOTE — Telephone Encounter (Signed)
 Left message for patient to return call.

## 2024-04-05 NOTE — Addendum Note (Signed)
 Addended by: DELFINO AUGUSTIN BROCKS on: 04/05/2024 09:36 AM   Modules accepted: Orders

## 2024-04-05 NOTE — Progress Notes (Signed)
 Cardiology Office Note:  .   Date:  04/05/2024  ID:  [name], DOB 06-17-1956, MRN [id number] PCP:  Onita Rush, MD  Former Cardiology Providers: NA [facility] Providers Cardiologist:  Madonna Large, DO , [facility] (established care 12/10/2020) Electrophysiologist:  None  Click to update primary MD,subspecialty MD or APP then REFRESH:1}    Chief Complaint  Patient presents with   Chronic heart failure with preserved ejection fraction   Follow-up    History of Present Illness: [name] is a 68 y.o.  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   Patient is accompanied by her daughter at today's office visit.  He provides verbal consent of having her present.  Last in the office in September 2020 for and since then he was hospitalized in December 2024 for MSSA bacteremia.  He underwent TEE and IV antibiotics and repeat TEE to make sure no findings for endocarditis were present.  Since then no reoccurrence of hospitalizations.  Since last office visit patient denies any anginal chest pain or heart failure symptoms.  He has been compliant with his medical therapy. Lost 3# since September 2024.  Physical endurance remains stable. Home SBP ranges between 120-140mmHG. Not smoking.    Review of Systems: .   Review of Systems  Cardiovascular:  Negative for chest pain, claudication, irregular heartbeat, leg swelling, near-syncope, orthopnea, palpitations, paroxysmal nocturnal dyspnea and syncope.  Respiratory:  Negative for shortness of breath.   Hematologic/Lymphatic: Negative for  bleeding problem.    Studies Reviewed:   EKG: EKG Interpretation Date/Time:  Thursday April 05 2024 10:15:41 EDT Ventricular Rate:  61 PR Interval:  152 QRS Duration:  102 QT Interval:  450 QTC Calculation: 453 R Axis:   -39  Text Interpretation: Sinus rhythm with Premature atrial complexes in a pattern of bigeminy Left axis deviation Minimal voltage criteria for LVH, may be normal variant ( Cornell product ) ST & T wave abnormality, consider lateral ischemia When compared with ECG of 07-Mar-2024 11:21, No significant change since last tracing Confirmed by Large Madonna [phone]) on 04/05/2024 10:29:48 AM  Echocardiogram: TEE 09/27/2023  1. Left ventricular ejection fraction, by estimation, is 60 to 65%. The  left ventricle has normal function. The left ventricle has no regional  wall motion abnormalities.   2. Right ventricular systolic function is normal. The right ventricular  size is normal.   3. No left atrial/left atrial appendage thrombus was detected.   4. The mitral valve is normal in structure. Trivial mitral valve  regurgitation. No evidence of mitral stenosis.   5. The aortic valve is tricuspid. Aortic valve regurgitation is trivial.  No aortic stenosis is present.   6. The inferior vena cava is normal in size with greater than 50%  respiratory variability, suggesting right atrial pressure of 3 mmHg.   Conclusion(s)/Recommendation(s): No evidence of vegetation/infective  endocarditis on this transesophageael echocardiogram.   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 If he has angina symptoms in future, could consider PCI to distal RCA.  RADIOLOGY: CT chest without contrast: 04/21/2021 1. Widespread patchy nodular thickening of the peribronchovascular interstitium throughout both lungs, most prominent in the upper lobes, with associated patchy peribronchovascular ground-glass opacity. Masslike 3.7 cm focus of consolidation in the anterior right lower lobe, new. 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 Follow-up outpatient high-resolution chest CT suggested in 3-6 months. 2. Mild cardiomegaly. Three-vessel coronary atherosclerosis. 3. Dilated main pulmonary artery, suggesting pulmonary arterial hypertension. 4. Ectatic 4.4 cm ascending thoracic aorta. Recommend annual imaging followup by CTA or MRA. This recommendation follows 2010 ACCF/AHA/AATS/ACR/ASA/SCA/SCAI/SIR/STS/SVM Guidelines for the Diagnosis and Management of Patients with Thoracic Aortic Disease. Circulation. 2010; 121: Z733-z630. Aortic aneurysm NOS (ICD10-I71.9). 5. Aortic Atherosclerosis (ICD10-I70.0).  CT ANGIOGRAPHY CHEST, ABDOMEN AND PELVIS 08/05/2023 1. No evidence of acute aortic syndrome. No aortic aneurysm or dissection. See report for additional details  Risk Assessment/Calculations:   NA   Labs:       Latest Ref Rng & Units 03/19/2024    4:39 PM 03/07/2024   11:30 AM 09/19/2023    3:24 PM  CBC  WBC 3.4 - 10.8 x10E3/uL 5.6  6.0  6.8   Hemoglobin 13.0 - 17.7 g/dL 88.6  88.1  86.3   Hematocrit 37.5 - 51.0 % 34.9  35.2  40.3   Platelets 150 - 450 x10E3/uL 271  219  243        Latest Ref Rng & Units 03/19/2024    4:39 PM 03/08/2024    9:17 AM 03/07/2024   11:30 AM  BMP  Glucose 70 - 99 mg/dL 93  847  898   BUN 8 - 27 mg/dL 18  13  13    Creatinine 0.76 - 1.27 mg/dL 7.59  7.89  8.01   BUN/Creat Ratio 10 - 24 8     Sodium 134 - 144 mmol/L 144  137  142   Potassium 3.5 - 5.2 mmol/L 4.4  3.2   3.7   Chloride 96 - 106 mmol/L 107  105  106   CO2 20 - 29 mmol/L 24  24  28    Calcium  8.6 - 10.2 mg/dL 9.5  9.0  9.4       Latest Ref Rng & Units 03/19/2024    4:39 PM 03/08/2024    9:17 AM 03/07/2024   11:30 AM  CMP  Glucose 70 - 99 mg/dL 93  847  898   BUN 8 - 27 mg/dL 18  13  13    Creatinine 0.76 - 1.27 mg/dL 7.59  7.89  8.01   Sodium 134 - 144 mmol/L 144  137  142   Potassium 3.5 - 5.2 mmol/L 4.4  3.2  3.7   Chloride 96 - 106 mmol/L 107  105  106   CO2 20 - 29 mmol/L 24  24  28    Calcium  8.6 - 10.2 mg/dL 9.5  9.0  9.4   Total Protein 6.0 - 8.5 g/dL 6.1   6.9   Total Bilirubin 0.0 - 1.2 mg/dL 0.3  0.6   Alkaline Phos 44 - 121 IU/L 80   84   AST 0 - 40 IU/L 18   26   ALT 0 - 44 IU/L 15   14     Lab Results  Component Value Date   CHOL 115 08/11/2023   HDL 35 (L) 08/11/2023   LDLCALC 58 08/11/2023   TRIG 111 08/11/2023   CHOLHDL 3.3 08/11/2023   No results for input(s): LIPOA in the last 8760 hours. No components found for: NTPROBNP Recent Labs    03/07/24 1130  PROBNP 2,372.0*   Recent Labs    03/07/24 1130 03/19/24 1639  TSH 1.020 1.560    Physical Exam:    Today's Vitals   04/05/24 0952  BP: (!) 140/88  Pulse: 60  Resp: 16  SpO2: 95%  Weight: 214 lb 6.4 oz (97.3 kg)  Height: 6' (1.829 m)   Body mass index is 29.08 kg/m. Wt Readings from Last 3 Encounters:  04/05/24 214 lb 6.4 oz (97.3 kg)  03/19/24 218 lb 6.4 oz (99.1 kg)  09/27/23 211 lb (95.7 kg)    Physical Exam  Constitutional: No distress.  hemodynamically stable  Neck: No JVD present.  Cardiovascular: Normal rate, regular rhythm, S1 normal and S2 normal. Exam reveals no gallop, no S3 and no S4.  No murmur heard. Pulmonary/Chest: Effort normal and breath sounds normal. No stridor. He has no wheezes. He has no rales.  Musculoskeletal:        General: No edema.     Cervical back: Neck supple.  Skin: Skin is warm.     Impression & Recommendation(s):  Impression:   ICD-10-CM    1. Chronic heart failure with preserved ejection fraction (HFpEF) (HCC)  I50.32 EKG 12-Lead    2. Essential hypertension  I10     3. Mixed hyperlipidemia  E78.2     4. NSVT (nonsustained ventricular tachycardia) (HCC)  I47.29     5. Sarcoidosis of lung (HCC)  D86.0     6. OSA (obstructive sleep apnea)  G47.33        Recommendation(s):  Chronic heart failure with preserved ejection fraction (HFpEF) (HCC) Stage C, NYHA class II Last heart failure hospitalization May 2023. Continue Farxiga  10 mg p.o. daily. Continue Imdur  90 mg p.o. daily. Continue hydralazine  100 mg p.o. 3 times daily Continue Toprol -XL 200 mg p.o. daily Continue diltiazem  360 mg p.o. daily, Was on Entresto  in the past but held secondary to AKI on CKD. Have asked him to keep a log of his blood pressures at home and if there is room for up titration would recommend restarting Entresto  or ARB.  Essential hypertension Office blood pressures are not at goal. I have asked him to start checking his blood pressures at home and if there is room based on his ambulatory readings would like to restart ARB/Entresto  which were held in the past secondary to AKI.  Mixed hyperlipidemia Continue Lipitor 20 mg p.o. daily  NSVT (nonsustained ventricular tachycardia) (HCC) Currently on metoprolol  and Cardizem . Has undergone appropriate ischemic workup in the past  OSA (obstructive sleep apnea) Has been intolerant to device therapy. Advised him to follow-up with his sleep provider for reevaluation to see if a different device could be more applicable to ensure compliance.   Orders Placed:  Orders Placed This Encounter  Procedures   EKG 12-Lead     Final Medication List:   No orders of the defined types were placed in this encounter.   Medications Discontinued  During This Encounter  Medication Reason   azithromycin  (ZITHROMAX ) 250 MG tablet Completed Course   gabapentin  (NEURONTIN ) 300 MG capsule Patient Preference    predniSONE  (DELTASONE ) 20 MG tablet Completed Course   predniSONE  (STERAPRED UNI-PAK 21 TAB) 10 MG (21) TBPK tablet Completed Course     Current Outpatient Medications:    aspirin  EC 81 MG tablet, Take 81 mg by mouth daily. Patients Wife gave him 4 tablets this am. 08/06/2023., Disp: , Rfl:    atorvastatin  (LIPITOR) 20 MG tablet, Take 1 tablet (20 mg total) by mouth daily., Disp: 90 tablet, Rfl: 0   dapagliflozin  propanediol (FARXIGA ) 10 MG TABS tablet, Take 1 tablet (10 mg total) by mouth daily., Disp: 30 tablet, Rfl: 12   diltiazem  (CARDIZEM  CD) 360 MG 24 hr capsule, Take 1 capsule (360 mg total) by mouth daily., Disp: 90 capsule, Rfl: 3   ELDERBERRY PO, Take 2 tablets by mouth daily. *gummy*, Disp: , Rfl:    hydrALAZINE  (APRESOLINE ) 100 MG tablet, Take 1 tablet (100 mg total) by mouth 3 (three) times daily., Disp: 270 tablet, Rfl: 2   ipratropium-albuterol  (DUONEB) 0.5-2.5 (3) MG/3ML SOLN, Take 3 mLs by nebulization every 6 (six) hours as needed (SOB/Wheezing)., Disp: , Rfl:    isosorbide  mononitrate (IMDUR ) 30 MG 24 hr tablet, Take 3 tablets (90 mg total) by mouth daily., Disp: 270 tablet, Rfl: 3   lidocaine  (LIDODERM ) 5 %, Place 1 patch onto the skin for 12 (twelve) hours as directed as needed. (1 patch on for 12 hours then off for 12 hours), Disp: 10 patch, Rfl: 0   metoprolol  (TOPROL -XL) 200 MG 24 hr tablet, Take 1 tablet (200 mg total) by mouth daily., Disp: 90 tablet, Rfl: 0   Omega-3 Fatty Acids (FISH OIL PO), Take 1 tablet by mouth daily., Disp: , Rfl:    tamsulosin  (FLOMAX ) 0.4 MG CAPS capsule, Take 1 capsule (0.4 mg total) by mouth daily., Disp: 30 capsule, Rfl: 6   timolol  (TIMOPTIC ) 0.5 % ophthalmic solution, Place 1 drop into the left eye 2 (two) times daily., Disp: 15 mL, Rfl: 3   traZODone  (DESYREL ) 150 MG tablet, Take 1 tablet (150 mg total) by mouth at bedtime., Disp: 90 tablet, Rfl: 3   triamcinolone  cream (KENALOG ) 0.1 %, Apply to affected areas twice a day as needed for  itching/inflammation., Disp: 80 g, Rfl: 2  Consent:   NA  Disposition:   1 year follow-up sooner if needed  His questions and concerns were addressed to his satisfaction. He voices understanding of the recommendations provided during this encounter.    Signed, Madonna Michele HAS, [facility]  A Division of [facility] [address] [place], [place] [address]  [place], [place] [address] 04/05/2024 1:07 PM

## 2024-04-05 NOTE — Telephone Encounter (Signed)
 Left message for patient to call back

## 2024-04-05 NOTE — Telephone Encounter (Signed)
 Medication request has been sent for the pt to provider to send in

## 2024-04-05 NOTE — Patient Instructions (Signed)
  Follow-Up: At [facility], you and your health needs are our priority.  As part of our continuing mission to provide you with exceptional heart care, our providers are all part of one team.  This team includes your primary Cardiologist (physician) and Advanced Practice Providers or APPs (Physician Assistants and Nurse Practitioners) who all work together to provide you with the care you need, when you need it.  Your next appointment:   1 year(s)  Provider:   Madonna Large, DO

## 2024-04-05 NOTE — Telephone Encounter (Signed)
 Patient requested meds for claustrophobia for pet scan please.

## 2024-04-05 NOTE — Telephone Encounter (Signed)
 Wife Engineer, maintenance) returned RN's call.

## 2024-04-05 NOTE — Telephone Encounter (Signed)
 no auth required sent to [facility] [phone]

## 2024-04-06 ENCOUNTER — Other Ambulatory Visit (HOSPITAL_COMMUNITY): Payer: Self-pay

## 2024-04-06 MED ORDER — ALPRAZOLAM 0.5 MG PO TABS
0.5000 mg | ORAL_TABLET | Freq: Every evening | ORAL | 0 refills | Status: DC | PRN
  Filled 2024-04-06: qty 2, 2d supply, fill #0

## 2024-04-09 DIAGNOSIS — D869 Sarcoidosis, unspecified: Secondary | ICD-10-CM | POA: Diagnosis not present

## 2024-04-09 DIAGNOSIS — I5032 Chronic diastolic (congestive) heart failure: Secondary | ICD-10-CM | POA: Diagnosis not present

## 2024-04-09 DIAGNOSIS — J449 Chronic obstructive pulmonary disease, unspecified: Secondary | ICD-10-CM | POA: Diagnosis not present

## 2024-04-09 DIAGNOSIS — Z0000 Encounter for general adult medical examination without abnormal findings: Secondary | ICD-10-CM | POA: Diagnosis not present

## 2024-04-09 DIAGNOSIS — I130 Hypertensive heart and chronic kidney disease with heart failure and stage 1 through stage 4 chronic kidney disease, or unspecified chronic kidney disease: Secondary | ICD-10-CM | POA: Diagnosis not present

## 2024-04-09 DIAGNOSIS — R4182 Altered mental status, unspecified: Secondary | ICD-10-CM | POA: Diagnosis not present

## 2024-04-09 DIAGNOSIS — E669 Obesity, unspecified: Secondary | ICD-10-CM | POA: Diagnosis not present

## 2024-04-09 DIAGNOSIS — Z1339 Encounter for screening examination for other mental health and behavioral disorders: Secondary | ICD-10-CM | POA: Diagnosis not present

## 2024-04-09 DIAGNOSIS — E785 Hyperlipidemia, unspecified: Secondary | ICD-10-CM | POA: Diagnosis not present

## 2024-04-09 DIAGNOSIS — Z8679 Personal history of other diseases of the circulatory system: Secondary | ICD-10-CM | POA: Diagnosis not present

## 2024-04-09 DIAGNOSIS — Z86711 Personal history of pulmonary embolism: Secondary | ICD-10-CM | POA: Diagnosis not present

## 2024-04-09 DIAGNOSIS — Z8619 Personal history of other infectious and parasitic diseases: Secondary | ICD-10-CM | POA: Diagnosis not present

## 2024-04-09 DIAGNOSIS — Z1331 Encounter for screening for depression: Secondary | ICD-10-CM | POA: Diagnosis not present

## 2024-04-09 DIAGNOSIS — R82998 Other abnormal findings in urine: Secondary | ICD-10-CM | POA: Diagnosis not present

## 2024-04-09 DIAGNOSIS — G4733 Obstructive sleep apnea (adult) (pediatric): Secondary | ICD-10-CM | POA: Diagnosis not present

## 2024-04-09 DIAGNOSIS — Z8616 Personal history of COVID-19: Secondary | ICD-10-CM | POA: Diagnosis not present

## 2024-04-09 DIAGNOSIS — Z79899 Other long term (current) drug therapy: Secondary | ICD-10-CM | POA: Diagnosis not present

## 2024-04-09 DIAGNOSIS — G3184 Mild cognitive impairment, so stated: Secondary | ICD-10-CM | POA: Diagnosis not present

## 2024-04-09 DIAGNOSIS — N1832 Chronic kidney disease, stage 3b: Secondary | ICD-10-CM | POA: Diagnosis not present

## 2024-04-09 DIAGNOSIS — I2510 Atherosclerotic heart disease of native coronary artery without angina pectoris: Secondary | ICD-10-CM | POA: Diagnosis not present

## 2024-04-09 DIAGNOSIS — H35039 Hypertensive retinopathy, unspecified eye: Secondary | ICD-10-CM | POA: Diagnosis not present

## 2024-04-10 NOTE — Telephone Encounter (Signed)
 Pt returning call

## 2024-04-10 NOTE — Telephone Encounter (Signed)
  Manda Lyle NOVAK, RN  Registered Nurse   Telephone Encounter Signed   Creation Time: 04/05/2024  1:49 PM   Signed     Left message for patient to return call.              Gave the patient the information above. He said that his BP has not been greater than 130. He will call if it gets above that.  He verbalized understanding of all information

## 2024-04-11 ENCOUNTER — Encounter (HOSPITAL_COMMUNITY)
Admission: RE | Admit: 2024-04-11 | Discharge: 2024-04-11 | Disposition: A | Discharge status: Home / Self Care | Source: Ambulatory Visit | Attending: Neurology | Admitting: Neurology

## 2024-04-11 DIAGNOSIS — G309 Alzheimer's disease, unspecified: Secondary | ICD-10-CM | POA: Insufficient documentation

## 2024-04-11 LAB — GLUCOSE, CAPILLARY: Glucose-Capillary: 92 mg/dL (ref 70–99)

## 2024-04-11 MED ORDER — FLUDEOXYGLUCOSE F - 18 (FDG) INJECTION
10.0000 | Freq: Once | INTRAVENOUS | Status: AC
  Administered 2024-04-11: 9.96 via INTRAVENOUS

## 2024-04-13 ENCOUNTER — Ambulatory Visit: Payer: Self-pay | Admitting: Neurology

## 2024-04-13 ENCOUNTER — Encounter: Payer: Self-pay | Admitting: Neurology

## 2024-04-17 DIAGNOSIS — J9601 Acute respiratory failure with hypoxia: Secondary | ICD-10-CM | POA: Diagnosis not present

## 2024-04-17 DIAGNOSIS — I2510 Atherosclerotic heart disease of native coronary artery without angina pectoris: Secondary | ICD-10-CM | POA: Diagnosis not present

## 2024-04-17 DIAGNOSIS — B348 Other viral infections of unspecified site: Secondary | ICD-10-CM | POA: Diagnosis not present

## 2024-04-17 DIAGNOSIS — J449 Chronic obstructive pulmonary disease, unspecified: Secondary | ICD-10-CM | POA: Diagnosis not present

## 2024-04-18 DIAGNOSIS — I129 Hypertensive chronic kidney disease with stage 1 through stage 4 chronic kidney disease, or unspecified chronic kidney disease: Secondary | ICD-10-CM | POA: Diagnosis not present

## 2024-04-18 DIAGNOSIS — N2581 Secondary hyperparathyroidism of renal origin: Secondary | ICD-10-CM | POA: Diagnosis not present

## 2024-04-18 DIAGNOSIS — I10 Essential (primary) hypertension: Secondary | ICD-10-CM | POA: Diagnosis not present

## 2024-04-18 DIAGNOSIS — N189 Chronic kidney disease, unspecified: Secondary | ICD-10-CM | POA: Diagnosis not present

## 2024-04-23 DIAGNOSIS — N183 Chronic kidney disease, stage 3 unspecified: Secondary | ICD-10-CM | POA: Diagnosis not present

## 2024-04-23 DIAGNOSIS — I10 Essential (primary) hypertension: Secondary | ICD-10-CM | POA: Diagnosis not present

## 2024-04-23 DIAGNOSIS — N2581 Secondary hyperparathyroidism of renal origin: Secondary | ICD-10-CM | POA: Diagnosis not present

## 2024-04-23 DIAGNOSIS — I129 Hypertensive chronic kidney disease with stage 1 through stage 4 chronic kidney disease, or unspecified chronic kidney disease: Secondary | ICD-10-CM | POA: Diagnosis not present

## 2024-04-23 DIAGNOSIS — D631 Anemia in chronic kidney disease: Secondary | ICD-10-CM | POA: Diagnosis not present

## 2024-04-30 DIAGNOSIS — H401231 Low-tension glaucoma, bilateral, mild stage: Secondary | ICD-10-CM | POA: Diagnosis not present

## 2024-05-16 ENCOUNTER — Encounter: Attending: Psychology | Admitting: Psychology

## 2024-05-16 ENCOUNTER — Encounter: Payer: Self-pay | Admitting: Psychology

## 2024-05-16 DIAGNOSIS — R4189 Other symptoms and signs involving cognitive functions and awareness: Secondary | ICD-10-CM | POA: Diagnosis not present

## 2024-05-16 DIAGNOSIS — R413 Other amnesia: Secondary | ICD-10-CM | POA: Diagnosis not present
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
## 2024-05-18 ENCOUNTER — Other Ambulatory Visit: Payer: Self-pay

## 2024-05-18 ENCOUNTER — Other Ambulatory Visit (HOSPITAL_COMMUNITY): Payer: Self-pay

## 2024-05-18 DIAGNOSIS — J9601 Acute respiratory failure with hypoxia: Secondary | ICD-10-CM | POA: Diagnosis not present

## 2024-05-18 DIAGNOSIS — B348 Other viral infections of unspecified site: Secondary | ICD-10-CM | POA: Diagnosis not present

## 2024-05-18 DIAGNOSIS — I2510 Atherosclerotic heart disease of native coronary artery without angina pectoris: Secondary | ICD-10-CM | POA: Diagnosis not present

## 2024-05-18 DIAGNOSIS — J449 Chronic obstructive pulmonary disease, unspecified: Secondary | ICD-10-CM | POA: Diagnosis not present

## 2024-05-18 MED ORDER — TRAMADOL HCL 50 MG PO TABS
50.0000 mg | ORAL_TABLET | Freq: Two times a day (BID) | ORAL | 0 refills | Status: AC | PRN
  Filled 2024-05-18: qty 30, 15d supply, fill #0

## 2024-05-22 ENCOUNTER — Other Ambulatory Visit (HOSPITAL_COMMUNITY): Payer: Self-pay

## 2024-05-22 DIAGNOSIS — J441 Chronic obstructive pulmonary disease with (acute) exacerbation: Secondary | ICD-10-CM | POA: Diagnosis not present

## 2024-05-22 DIAGNOSIS — I5032 Chronic diastolic (congestive) heart failure: Secondary | ICD-10-CM | POA: Diagnosis not present

## 2024-05-22 DIAGNOSIS — F17210 Nicotine dependence, cigarettes, uncomplicated: Secondary | ICD-10-CM | POA: Diagnosis not present

## 2024-05-22 DIAGNOSIS — J309 Allergic rhinitis, unspecified: Secondary | ICD-10-CM | POA: Diagnosis not present

## 2024-05-22 DIAGNOSIS — R051 Acute cough: Secondary | ICD-10-CM | POA: Diagnosis not present

## 2024-05-22 DIAGNOSIS — Z86711 Personal history of pulmonary embolism: Secondary | ICD-10-CM | POA: Diagnosis not present

## 2024-05-22 MED ORDER — DOXYCYCLINE HYCLATE 100 MG PO TABS
100.0000 mg | ORAL_TABLET | Freq: Two times a day (BID) | ORAL | 0 refills | Status: DC
  Filled 2024-05-22: qty 20, 10d supply, fill #0

## 2024-05-22 MED ORDER — PREDNISONE 20 MG PO TABS
40.0000 mg | ORAL_TABLET | Freq: Every day | ORAL | 0 refills | Status: AC
  Filled 2024-05-22: qty 10, 5d supply, fill #0

## 2024-05-23 ENCOUNTER — Ambulatory Visit: Admitting: Psychology

## 2024-05-23 ENCOUNTER — Encounter

## 2024-05-23 DIAGNOSIS — R413 Other amnesia: Secondary | ICD-10-CM

## 2024-05-23 DIAGNOSIS — R4189 Other symptoms and signs involving cognitive functions and awareness: Secondary | ICD-10-CM | POA: Diagnosis not present

## 2024-05-23 NOTE — Progress Notes (Signed)
 Mental Status/Behavioral Observations (05/23/2024):  Orientation: The patient was oriented to self, place, and time. Sensory/Arousal: Hearing and vision were adequate for testing. The patient was alert. Appearance: Dress and hygiene were appropriate for the setting.  Speech/Language: In conversation, the patient's speech was prosodic, fluent, and well-articulated. The patient displayed no indications of word finding difficulties and no paraphasic errors were observed.  Motor: The patient ambulated independently and without issue. No tremors were observed.  Social Comportment: Social behavior was appropriate to the setting. Mood/Affect: Mood was largely neutral. Affect was consistent with mood.  Attention/Concentration:  The patient maintained consistent engagement throughout the testing session. No frank attentional lapses were appreciated.  Thought Process/Content: The patient's thought process was coherent, linear, goal directed. There were no indications of psychosis.  Additional Observations: The patient had no difficulties with understanding task instructions.   Neuropsychology Note BENTON TOOKER completed 120 minutes of neuropsychological testing with technician, Josue Ned, BA, under the supervision of Evalene Riff, PsyD., Clinical Neuropsychologist. The patient did not appear overtly distressed by the testing session, per behavioral observation or via self-report to the technician. Rest breaks were offered.   Clinical Decision Making: In considering the patient's current level of functioning, level of presumed impairment, nature of symptoms, emotional and behavioral responses during clinical interview, level of literacy, and observed level of motivation/effort, a battery of tests was selected by Dr. Riff during initial consultation on 05/16/2024. This was communicated to the technician. Communication between the neuropsychologist and technician was ongoing throughout the testing  session and changes were made as deemed necessary based on patient performance on testing, technician observations and additional pertinent factors such as those listed above.  Tests Administered: Automatic Data Edition (BNT-2) Brief Visuospatial Memory Test-Revised (BVMT-R) Clock Drawing Test Controlled Oral Word Association Test (FAS & Animals) Delis-Kaplan Executive Function System (D-KEFS), select subtests Hopkins Verbal Learning Test - Revised (HVLT-R) Repeatable Battery for the Assessment of Neuropsychological Status Update (RBANS) Trail Making Test (TMT; Part A & B) Wechsler Adult Intelligence Scale-Fourth Edition (WAIS-IV), select subtests Wechsler Memory Scale-Fourth Edition (WMS-IV) , select subtests Wechsler Memory Scale-Third Edition (WMS-III), select subtests  Wechsler Test of Adult Reading (WTAR) Geriatric Depression Scale-Short Form (GDS-SF) Geriatric Anxiety Inventory (GAI)  Results: Note: This summary of test scores accompanies the interpretive report and should not be interpreted by unqualified individuals or in isolation without reference to the report. Test scores are relative to age, gender, and educational history as available and appropriate. Measurement properties of test scores: IQ, Index, and Standard Scores (SS): Mean = 100; Standard Deviation = 15; Scaled Scores (ss): Mean = 10; Standard Deviation = 3; Z scores (Z): Mean = 0; Standard Deviation = 1; T scores (T); Mean = 50; Standard Deviation = 10  Intellectual/Premorbid Functioning Estimate   Norm Score Percentile  Range  Wechsler Test of Adult Reading  SS = 117 87 %ile High Average   ATTENTION AND WORKING MEMORY    Norm Score Percentile  Range  WAIS-IV          Digit Span  ss = 8 25 %ile Average   DSF  ss = 10 50 %ile Average   Span:    6      DSB  ss = 8 25 %ile Average   Span:    3      DSS  ss = 7 16 %ile Low Average   Span:    4     WMS-III  Spatial Span  ss = 6 9 %ile Low Average    SSF  ss = 7 16 %ile Low Average   Span:    4      SSB  ss = 6 9 %ile Low Average   Span:    4      PROCESSING SPEED    Norm Score Percentile  Range  WAIS-IV          Coding  ss = 5 5 %ile Below Average   Symbol Search  ss = 7 16 %ile Low Average   LANGUAGE    Norm Score Percentile  Range  Boston Naming Test (BNT-2)  t = 39 13 %ile Low Average  COWAT          FAS  t = 39 13 %ile Low Average   Animals  t = 33 5 %ile Below Average   EXECUTIVE FUNCTIONING    Norm Score Percentile  Range  DKEFS - Color-Word Interference          Color Naming  ss = 7 16 %ile Low Average   Word Reading  ss = 9 37 %ile Average   Inhibition  ss = 1 0.1 %ile Exceptionally Low   Errors  ss = 5 5 %ile Below Average   Inhibition Switching  ss = 4 2 %ile Below Average   Errors  ss = 9 37 %ile Average  Trails A (0 error) t = 28 2 %ile Exceptionally Low  Trails B (2 error) t = 26 1 %ile Exceptionally Low   MEMORY    Norm Score Percentile  Range  BVMT-R          Trial 1  t = 39.0 13 %ile Low Average   Trial 2  t = <20 <0.1 %ile Exceptionally Low   Trial 3  t = <20 <0.1 %ile Exceptionally Low   Total Recall  t =  20 <0.1 %ile Exceptionally Low   Learning  t = <=20 <=0.1 %ile Exceptionally Low   Delayed Recall  t = <20 <0.1 %ile Exceptionally Low   % Retained    100 >16 %ile WNL   Hits     >16 %ile WNL   False Alarms     >16 %ile WNL   Recognition Discriminability     >16 %ile WNL  HVLT          Total Recall  t = 28.0 2 %ile Exceptionally Low   Delayed Recall  t = 27 1 %ile Exceptionally Low   %Retention  t = 36.0 8 %ile Below Average   Recognition Discriminability  t = 38.0 12 %ile Low Average  Wechsler Memory Scale, 4th Edition (WMS-4)         Log. Mem. Immediate Recall  ss = 9 37 %ile Average   Logical Memory Delayed Recall  ss = 7 16 %ile Low Average   Logical Recognition    >75th   %ile High Average   VISUAL-SPATIAL    Norm Score Percentile  Range  Clock       (See interpretation)             RBANS Visuospatial Index          RBANS Figure Copy  ss = 2 0.4 %ile Exceptionally Low   RBANS Line Orientation      >75th  %ile WNL   PERSONALITY AND BEHAVIORAL FUNCTIONING      Score/Interpretation  GDS-SF Raw  1  GDS-SF Severity       Minimal.  GAI Raw       0  GAI Severity       Minimal.    Feedback to Patient: JAECE DUCHARME will return on 06/08/2024 for an interactive feedback session with Dr. Hayden at which time his test performances, clinical impressions and treatment recommendations will be reviewed in detail. The patient understands he can contact our office should he require our assistance before this time.  120 minutes spent face-to-face with patient administering standardized tests, 30 minutes spent scoring Radiographer, therapeutic). [CPT [id number], [id number]]  Full report to follow.

## 2024-05-28 ENCOUNTER — Encounter: Admitting: Psychology

## 2024-05-28 DIAGNOSIS — R4189 Other symptoms and signs involving cognitive functions and awareness: Secondary | ICD-10-CM | POA: Diagnosis not present

## 2024-05-28 DIAGNOSIS — G3184 Mild cognitive impairment, so stated: Secondary | ICD-10-CM | POA: Diagnosis not present

## 2024-05-29 ENCOUNTER — Other Ambulatory Visit (HOSPITAL_COMMUNITY): Payer: Self-pay

## 2024-05-29 ENCOUNTER — Other Ambulatory Visit: Payer: Self-pay

## 2024-05-30 ENCOUNTER — Other Ambulatory Visit: Payer: Self-pay

## 2024-06-08 ENCOUNTER — Ambulatory Visit (INDEPENDENT_AMBULATORY_CARE_PROVIDER_SITE_OTHER): Admitting: Internal Medicine

## 2024-06-08 ENCOUNTER — Encounter: Attending: Psychology | Admitting: Psychology

## 2024-06-08 ENCOUNTER — Encounter: Payer: Self-pay | Admitting: Internal Medicine

## 2024-06-08 VITALS — BP 136/84 | HR 58 | Temp 98.3°F | Ht 72.0 in | Wt 216.6 lb

## 2024-06-08 DIAGNOSIS — Z87891 Personal history of nicotine dependence: Secondary | ICD-10-CM

## 2024-06-08 DIAGNOSIS — J4489 Other specified chronic obstructive pulmonary disease: Secondary | ICD-10-CM | POA: Diagnosis not present

## 2024-06-08 DIAGNOSIS — R413 Other amnesia: Secondary | ICD-10-CM | POA: Insufficient documentation

## 2024-06-08 DIAGNOSIS — D869 Sarcoidosis, unspecified: Secondary | ICD-10-CM

## 2024-06-08 DIAGNOSIS — G3184 Mild cognitive impairment, so stated: Secondary | ICD-10-CM

## 2024-06-08 DIAGNOSIS — R4189 Other symptoms and signs involving cognitive functions and awareness: Secondary | ICD-10-CM | POA: Insufficient documentation

## 2024-06-08 MED ORDER — BREZTRI AEROSPHERE 160-9-4.8 MCG/ACT IN AERO
2.0000 | INHALATION_SPRAY | Freq: Two times a day (BID) | RESPIRATORY_TRACT | Status: DC
Start: 2024-06-08 — End: 2024-06-25

## 2024-06-08 NOTE — Assessment & Plan Note (Addendum)
 Low-dose CT lung cancer screening is recommended for patients who are 20-67 years of age with a 20+ pack-year history of smoking and who are currently smoking or quit <=15 years ago. 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

## 2024-06-08 NOTE — Assessment & Plan Note (Addendum)
 Quit smoking 02/2024 - 06/08/2024   Walked on RA  x  3   lap(s) =  approx 750  ft  @ mod pace, stopped due to end of study s sob / cp  with lowest 02 sats 95%  - 06/08/2024  After extensive coaching inhaler device,  effectiveness =  75% (uneven and short Ti wit hfa  >>>> continue trial of breztri bid and return for PFTs next available   >>> encouraged to be more active to tell whether maint bronchodilators are really helping or not .   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

## 2024-06-08 NOTE — Patient Instructions (Signed)
 Plan A = Automatic = Always=    breztri Take 2 puffs first thing in am and then another 2 puffs about 12 hours later.     Work on inhaler technique:  relax and gently blow all the way out then take a nice smooth full deep breath back in, triggering the inhaler at same time you start breathing in.  Hold breath in for at least  5 seconds if you can. Blow out breztri  thru nose. Rinse and gargle with water when done.  If mouth or throat bother you at all,  try brushing teeth/gums/tongue with arm and hammer toothpaste/ make a slurry and gargle and spit out.      Plan B = Backup (to supplement plan A, not to replace it) Use your duoneb (albuterol -ipatropium)as a rescue medication to be used if you can't catch your breath by resting or slowing your pace  or doing a relaxed purse lip breathing pattern.  - The less you use it, the better it will work when you need it. - Ok to use the inhaler up to   every 4 hours if you must but call for appointment if use goes up      My office will be contacting you by phone for referral to lung cancer screening   [phone]) - if you don't hear back from my office within one week,  please call us  back or notify us  thru MyChart and we'll address it right away.     PFTs next available and I will call will you if I think you need follow up here.

## 2024-06-08 NOTE — Progress Notes (Signed)
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 statistically significant and he correctly indicated the designated time on the clock drawing. On visual spatial tasks he had some difficulty with visual construction (figure copy on both RBANS and BVMT copy trial, visual planning on clock) but his judgement of line orientation was intact. Rating scale endorsements showed no clear indications of anxiety or depression.  The patient's cognitive test data shows indications suggestive of fairly substantial cognitive deficits, but with some variability. Deficit severity on testing would be appropriate for a major neurocognitive disorder. 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 Ongoing support and collaborative systems are strongly recommended to ensure safe independent functioning given concerns for deficit severity and risk of future decline. The patient's cognitive profile aligns best with  deficits associated with cerebrovascular disease with prominent struggles with executive functioning, processing speed, and a dysexecutive memory profile. Chronic health conditions (e.g., cardiovascular, metabolic) may also be contributory directly or indirectly. He does show some mild declines in other areas, particularly working memory and pharmacist, community as well. Consistent with other lab-work/imaging, scores are not showing amnestic memory deficits frequently seen in Alzheimer's related cognitive impairments. There are some changes in language functioning that could align with AD, but data is not compelling overall. Similarly, symptom constellation and behavioral observations are not compelling for other etiologist which can share features of the patient's cognitive profile At this point, management of chronic medical conditions, using collaborative systems to support daily living, and monitoring for cognitive decline is recommended. Additionally, a hearing test is recommended to ensure there is no untreated hearing loss as this can increase risk of cognitive decline in addition to interfere with daily life in unhelpful ways.   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  Continued engagement with medical health providers for management of chronic medical conditions is strongly recommended. For example, management of cardiovascular health is important given close relationship between cardiovascular health (heart health) and cerebrovascular health (brain health). This can reduce increase risk of cerebrovascular conditions such as stroke, and worsening of existing chronic cerebrovascular changes. There are some indications of possible hearing loss. A hearing test may be beneficial. Untreated hearing loss can make day-to-day interactions more difficult and indirectly impact things like memory. Additionally, untreated hearing loss  has been found to increase risk of cognitive decline.  The MIND diet, a modified mediterranean diet, has been found to be beneficial for reducing risk of cognitive decline and may be beneficial for promoting brain health. The patient is likely to continue to require continued support for independent functioning, specifically for those more complex activities (I.e., medications, appointments, etc.). It is possible that, over time, there is additional functional decline and greater levels of support may be needed. Using collaborative systems (as you and your wife currently are) is encouraged particularly for tasks where errors can impact health/safety (e.g., medication errors). An example of a collaborative system would be someone who is able to remember to take their medications daily without issue, but might struggle with setting up a weekly pill organizer. A loved one can double check the pill organizer for errors, and step in if needed to correct errors, or take over that aspect of medication management if it becomes too challenging.  Collaborative systems like this promote independence but also allow detection of errors and support as needed.   Cognitive stimulation and physical exercise exercise (under physician supervision and approval) may be beneficial as these activities can reduce risk / rate of cognitive decline. Cognitively stimulating activities are essentially anything that requires your active participation and some level of concentration. It can take many forms. Social interaction is particularly stimulating, for example. Reading, puzzles, and even day to day problem solving are also some examples. Having a variety of activities and activities that you enjoy is ideal. Find what works for you!  Given concerns for cognitive decline, a re-evaluation in 11-months may be useful to continue monitoring for cognitive changes if felt beneficial by the referring provider and the patient and family.    Please feel free to reach out with any questions or concerns that may arise upon receipt of this report.     Evalene DOROTHA Riff, PsyD Clinical Neuropsychology [address] [place]  [address] [place], [place] [address] Main: [phone] Fax: [phone]  This report was generated using voice recognition software. While this document has been carefully reviewed, transcription errors may be present. I apologize in advance for any inconvenience. Please contact me if further clarification is needed.

## 2024-06-08 NOTE — Progress Notes (Signed)
 [name], male    DOB: 01/16/1956   MRN: [id number]   Brief patient profile:  50 yobm quit smoking 02/2024 former Icard pt referred to pulmonary clinic 06/08/2024  for cough / sob with dx of  sarcoidosis  by D Young around 2005 and took low dose prednisone  until around 2022 and and since then just bursts up to 40 maybe 2-3 per year  with last one completed 05/27/24 and the 3rd in 2025      and rx with breztri and some better by time of  first ov 06/08/2024      History of Present Illness  06/08/2024  Pulmonary/ 1st office eval/Rishon Thilges  Chief Complaint  Patient presents with   Cough    Cough x 4 weeks.  Seen by PCP.  Rx abt and prednisone .  No change in sx.   Dyspnea:  back to baseline = neighborhood walk 5-10 min slow pace not really limited by doe typically  Cough: smoker's rattle  >  white mucus mostly  worse 1st thing in am x couple hours Sleep: flat bed 2 pillows sleeps ok s resp cc until am  SABA use: neb rarely feels he needs it  02 ldz:wnwz     No obvious day to day or daytime pattern/variability or assoc  purulent sputum or mucus plugs or hemoptysis or cp or chest tightness, subjective wheeze or overt sinus or hb symptoms.    Also denies any obvious fluctuation of symptoms with weather or environmental changes or other aggravating or alleviating factors except as outlined above   No unusual exposure hx or h/o childhood pna/ asthma or knowledge of premature birth.  Current Allergies, Complete Past Medical History, Past Surgical History, Family History, and Social History were reviewed in [employer] record.  ROS  The following are not active complaints unless bolded Hoarseness, sore throat, dysphagia, dental problems, itching, sneezing,  nasal congestion or discharge of excess mucus or purulent secretions, ear ache,   fever, chills, sweats, unintended wt loss or wt gain, classically pleuritic or exertional cp,  orthopnea pnd or arm/hand swelling  or leg  swelling, presyncope, palpitations, abdominal pain, anorexia, nausea, vomiting, diarrhea  or change in bowel habits or change in bladder habits, change in stools or change in urine, dysuria, hematuria,  rash, arthralgias, visual complaints, headache, numbness, weakness or ataxia or problems with walking or coordination,  change in mood or  memory.             Outpatient Medications Prior to Visit  Medication Sig Dispense Refill   aspirin  EC 81 MG tablet Take 81 mg by mouth daily. Patients Wife gave him 4 tablets this am. 08/06/2023.     atorvastatin  (LIPITOR) 20 MG tablet Take 1 tablet (20 mg total) by mouth daily. 90 tablet 0   dapagliflozin  propanediol (FARXIGA ) 10 MG TABS tablet Take 1 tablet (10 mg total) by mouth daily. 30 tablet 12   diltiazem  (CARDIZEM  CD) 360 MG 24 hr capsule Take 1 capsule (360 mg total) by mouth daily. 90 capsule 3   ELDERBERRY PO Take 2 tablets by mouth daily. *gummy*     hydrALAZINE  (APRESOLINE ) 100 MG tablet Take 1 tablet (100 mg total) by mouth 3 (three) times daily. 270 tablet 2   ipratropium-albuterol  (DUONEB) 0.5-2.5 (3) MG/3ML SOLN Take 3 mLs by nebulization every 6 (six) hours as needed (SOB/Wheezing).     isosorbide  mononitrate (IMDUR ) 30 MG 24 hr tablet Take 3 tablets (90 mg total)  by mouth daily. 270 tablet 3   lidocaine  (LIDODERM ) 5 % Place 1 patch onto the skin for 12 (twelve) hours as directed as needed. (1 patch on for 12 hours then off for 12 hours) 10 patch 0   metoprolol  (TOPROL -XL) 200 MG 24 hr tablet Take 1 tablet (200 mg total) by mouth daily. 90 tablet 0   Omega-3 Fatty Acids (FISH OIL PO) Take 1 tablet by mouth daily.     predniSONE  (DELTASONE ) 20 MG tablet Take 2 tablets (40 mg total) by mouth daily. 10 tablet 0   tamsulosin  (FLOMAX ) 0.4 MG CAPS capsule Take 1 capsule (0.4 mg total) by mouth daily. 30 capsule 6   timolol  (TIMOPTIC ) 0.5 % ophthalmic solution Place 1 drop into the left eye 2 (two) times daily. 15 mL 3   torsemide  (DEMADEX ) 20 MG  tablet Take 20 mg by mouth daily as needed (for edema).     traMADol  (ULTRAM ) 50 MG tablet Take 1 tablet (50 mg total) by mouth 2 (two) times daily as needed for Pain for up to 15 days. Max Daily Amount: 100 mg 30 tablet 0   traZODone  (DESYREL ) 150 MG tablet Take 1 tablet (150 mg total) by mouth at bedtime. 90 tablet 3   triamcinolone  cream (KENALOG ) 0.1 % Apply to affected areas twice a day as needed for itching/inflammation. 80 g 2   ALPRAZolam  (XANAX ) 0.5 MG tablet Take 1 tablet (0.5 mg total) by mouth at bedtime as needed for anxiety (take 1 tablet 30 min prior to procedure. may repeat additional tablet at time of vist (must have a driver)). (Patient not taking: Reported on 04/13/2024) 2 tablet 0   doxycycline  (VIBRA -TABS) 100 MG tablet Take 1 tablet (100 mg total) by mouth 2 (two) times daily. 20 tablet 0   No facility-administered medications prior to visit.    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   ED (erectile dysfunction)    GERD (gastroesophageal reflux disease)    Hypercalcemia    Hyperlipidemia    Hypertension    Insomnia    Long-term use of high-risk medication    Nephrocalcinosis    Nephrolithiasis    Obesity    Osteopenia    Pancreatitis    admitted 03/19/01-06/05/01   PE (pulmonary embolism) 01/30/2013   Proteinuria    Pulmonary embolism (HCC) 01/30/2013   Sarcoidosis    Sleep apnea    Smoker    Stroke (HCC)       Objective:     BP 136/84   Pulse (!) 58   Temp 98.3 F (36.8 C) (Oral)   Ht 6' (1.829 m)   Wt 216 lb 9.6 oz (98.2 kg)   SpO2  96% Comment: room air  BMI 29.38 kg/m   SpO2: 96 % (room air) somber bm smoker's rattle  Minimal insp/ exp rhonchi   HEENT : Oropharynx  clear n  Nasal turbinates nl    NECK :  without  apparent JVD/ palpable Nodes/TM    LUNGS: no acc muscle use,  Min  barrel  contour chest wall with bilateral but minimal   insp/exp rhonchi  and  without cough on insp or exp maneuvers and min  Hyperresonant  to  percussion bilaterally    CV:  RRR  no s3 or murmur or increase in P2, and no edema   ABD:  soft and nontender    MS:  Nl gait/ ext warm without deformities Or obvious joint restrictions  calf tenderness, cyanosis or clubbing     SKIN: warm and dry without lesions    NEURO:  alert, approp, nl sensorium with  no motor or cerebellar deficits apparent.         I personally reviewed images and agree with radiology impression as follows:  CXR:   portable 03/07/24  Streaky right basilar atelectasis. Otherwise, no acute cardiopulmonary abnormality.     Assessment   Assessment & Plan Former cigarette smoker Low-dose CT lung cancer screening is recommended for patients who are 12-20 years of age with a 20+ pack-year history of smoking and who are currently smoking or quit <=15 years ago. No coughing up blood  No unintentional weight loss of > 15 pounds in the last 6 months - pt is eligible for scanning yearly until age 33  >>> referred, though not clear we can do successful distinction with nodules due to low grad sarcoid / advised    Asthmatic bronchitis , chronic (HCC) Quit smoking 02/2024 - 06/08/2024   Walked on RA  x  3   lap(s) =  approx 750  ft  @ mod pace, stopped due to end of study s sob / cp  with lowest 02 sats 95%  - 06/08/2024  After extensive coaching inhaler device,  effectiveness =  75% (uneven and short Ti wit hfa  >>>> continue trial of breztri bid and return for PFTs next available   >>> encouraged to be more active to tell whether maint bronchodilators are really  helping or not .   Comment If this is copd appears to be having aecopd rather than sarcoid flares > proceed with pfts to sort out and continue breztri 2bid  since this would be Group E in terms of symptoms/risk so  laba/lama/ICS  therefore appropriate rx at this point     Sarcoidosis Dx around 2025 on prednisone   daily until ? 2022 and prn since then   Comment: No more prednisone  needed for now clinically and I could well be if most the sarcoid is airway related and not progressive PF (as appears to be the case) we can control it with adequate ICS along and minimize OCS esp since no longer smoker.  >>>  F/u pfts pending   Each maintenance medication was reviewed in detail including emphasizing most importantly the difference between maintenance and prns and under what circumstances the prns are to be triggered using an action plan format where appropriate.  Total time for H and P, chart review, counseling, reviewing hfa/ neb  device(s) , directly observing portions of ambulatory 02 saturation study/ and generating customized AVS unique to this office visit / same day charting = 45 min pt new to me                  AVS  Patient Instructions  Plan A = Automatic = Always=    breztri Take 2 puffs first thing in am and then another 2 puffs about 12 hours later.     Work on inhaler technique:  relax and gently blow all  the way out then take a nice smooth full deep breath back in, triggering the inhaler at same time you start breathing in.  Hold breath in for at least  5 seconds if you can. Blow out breztri  thru nose. Rinse and gargle with water when done.  If mouth or throat bother you at all,  try brushing teeth/gums/tongue with arm and hammer toothpaste/ make a slurry and gargle and spit out.      Plan B = Backup (to supplement plan A, not to replace it) Use your duoneb (albuterol -ipatropium)as a rescue medication to be used if you can't catch your breath by resting or slowing your pace   or doing a relaxed purse lip breathing pattern.  - The less you use it, the better it will work when you need it. - Ok to use the inhaler up to   every 4 hours if you must but call for appointment if use goes up      My office will be contacting you by phone for referral to lung cancer screening   [phone]) - if you don't hear back from my office within one week,  please call us  back or notify us  thru MyChart and we'll address it right away.     PFTs next available and I will call will you if I think you need follow up here.    Ozell America, MD 06/08/2024

## 2024-06-08 NOTE — Assessment & Plan Note (Addendum)
 Dx around 2025 on prednisone   daily until ? 2022 and prn since then   Comment: No more prednisone  needed for now clinically and I could well be if most the sarcoid is airway related and not progressive PF (as appears to be the case) we can control it with adequate ICS along and minimize OCS esp since no longer smoker.  >>>  F/u pfts pending   Each maintenance medication was reviewed in detail including emphasizing most importantly the difference between maintenance and prns and under what circumstances the prns are to be triggered using an action plan format where appropriate.  Total time for H and P, chart review, counseling, reviewing hfa/ neb  device(s) , directly observing portions of ambulatory 02 saturation study/ and generating customized AVS unique to this office visit / same day charting = 45 min pt new to me

## 2024-06-22 ENCOUNTER — Encounter (HOSPITAL_BASED_OUTPATIENT_CLINIC_OR_DEPARTMENT_OTHER)

## 2024-06-25 ENCOUNTER — Other Ambulatory Visit: Payer: Self-pay | Admitting: Internal Medicine

## 2024-06-25 ENCOUNTER — Other Ambulatory Visit (HOSPITAL_COMMUNITY): Payer: Self-pay

## 2024-06-25 MED ORDER — BREZTRI AEROSPHERE 160-9-4.8 MCG/ACT IN AERO
2.0000 | INHALATION_SPRAY | Freq: Two times a day (BID) | RESPIRATORY_TRACT | 11 refills | Status: AC
  Filled 2024-06-25: qty 10.7, 30d supply, fill #0
  Filled 2024-07-23: qty 10.7, 30d supply, fill #1
  Filled 2024-08-25: qty 10.7, 30d supply, fill #2

## 2024-06-25 NOTE — Telephone Encounter (Signed)
 Copied from CRM #[id number]. Topic: Clinical - Medication Refill >> Jun 25, 2024 12:35 PM Ismael A wrote: Medication: budesonide-glycopyrrolate-formoterol  (BREZTRI AEROSPHERE) 160-9-4.8 MCG/ACT AERO inhale  Has the patient contacted their pharmacy? No (Agent: If no, request that the patient contact the pharmacy for the refill. If patient does not wish to contact the pharmacy document the reason why and proceed with request.) (Agent: If yes, when and what did the pharmacy advise?)  This is the patient's preferred pharmacy:  [place] [facility] [address] [place] [address] Phone: [phone] Fax: [phone]  Is this the correct pharmacy for this prescription? Yes If no, delete pharmacy and type the correct one.   Has the prescription been filled recently? No  Is the patient out of the medication? No  Has the patient been seen for an appointment in the last year OR does the patient have an upcoming appointment? Yes  Can we respond through MyChart? Yes  Agent: Please be advised that Rx refills may take up to 3 business days. We ask that you follow-up with your pharmacy.

## 2024-06-26 ENCOUNTER — Telehealth: Payer: Self-pay

## 2024-06-26 ENCOUNTER — Encounter (HOSPITAL_BASED_OUTPATIENT_CLINIC_OR_DEPARTMENT_OTHER)

## 2024-06-26 DIAGNOSIS — Z87891 Personal history of nicotine dependence: Secondary | ICD-10-CM

## 2024-06-26 DIAGNOSIS — Z122 Encounter for screening for malignant neoplasm of respiratory organs: Secondary | ICD-10-CM

## 2024-06-26 NOTE — Telephone Encounter (Addendum)
 Lung Cancer Screening Narrative/Criteria Questionnaire (Cigarette Smokers Only- No Cigars/Pipes/vapes)   [name]   SDMV:07/18/2024 at  9:00 am [name]    1956-03-02               LDCT: 11/14//2025 at 12:50 pm at [facility]    68 y.o.   Phone: [phone]  Lung Screening Narrative (confirm age 18-77 yrs Medicare / 50-80 yrs Private pay insurance)   Insurance information: HTA   Referring Provider: Darlean, MD   This screening involves an initial phone call with a team member from our program. It is called a shared decision making visit. The initial meeting is required by  insurance and Medicare to make sure you understand the program. This appointment takes about 15-20 minutes to complete. You will complete the screening scan at your scheduled date/time.  This scan takes about 5-10 minutes to complete. You can eat and drink normally before and after the scan.  Criteria questions for Lung Cancer Screening:   Are you a current or former smoker? Former Age began smoking: 26   If you are a former smoker, what year did you quit smoking?Quit 09/2023 (within 15 yrs)   To calculate your smoking history, I need an accurate estimate of how many packs of cigarettes you smoked per day and for how many years. (Not just the number of PPD you are now smoking)   Years smoking 42 x Packs per day 1.5 = Pack years 63   (at least 20 pack yrs)   (Make sure they understand that we need to know how much they have smoked in the past, not just the number of PPD they are smoking now)  Do you have a personal history of cancer?  No    Do you have a family history of cancer? Yes  (cancer type and and relative) Father had lung cancer.   Are you coughing up blood?  No  Have you had unexplained weight loss of 15 lbs or more in the last 6 months? No  It looks like you meet all criteria.  When would be a good time for us  to schedule you for this screening?   Additional information: N/A

## 2024-07-02 ENCOUNTER — Other Ambulatory Visit (HOSPITAL_COMMUNITY): Payer: Self-pay

## 2024-07-02 MED ORDER — CEFDINIR 300 MG PO CAPS
300.0000 mg | ORAL_CAPSULE | Freq: Two times a day (BID) | ORAL | 0 refills | Status: AC
  Filled 2024-07-02: qty 14, 7d supply, fill #0

## 2024-07-03 ENCOUNTER — Ambulatory Visit

## 2024-07-03 NOTE — Progress Notes (Unsigned)
 Attempted to reach pt on both home and mobile phones- unable to reach him.  LMOM to call office back to rescheduled SDMV before lung CT on 07-13-24

## 2024-07-06 ENCOUNTER — Other Ambulatory Visit

## 2024-07-06 ENCOUNTER — Ambulatory Visit (HOSPITAL_BASED_OUTPATIENT_CLINIC_OR_DEPARTMENT_OTHER)

## 2024-07-06 DIAGNOSIS — J4489 Other specified chronic obstructive pulmonary disease: Secondary | ICD-10-CM

## 2024-07-06 LAB — PULMONARY FUNCTION TEST
DL/VA % pred: 117 %
DL/VA: 4.77 ml/min/mmHg/L
DLCO unc % pred: 60 %
DLCO unc: 16.94 ml/min/mmHg
FEF 25-75 Post: 1.72 L/s
FEF 25-75 Pre: 0.93 L/s
FEF2575-%Change-Post: 85 %
FEF2575-%Pred-Post: 61 %
FEF2575-%Pred-Pre: 32 %
FEV1-%Change-Post: 26 %
FEV1-%Pred-Post: 49 %
FEV1-%Pred-Pre: 39 %
FEV1-Post: 1.81 L
FEV1-Pre: 1.43 L
FEV1FVC-%Change-Post: 21 %
FEV1FVC-%Pred-Pre: 86 %
FEV6-%Change-Post: 2 %
FEV6-%Pred-Post: 49 %
FEV6-%Pred-Pre: 48 %
FEV6-Post: 2.3 L
FEV6-Pre: 2.24 L
FEV6FVC-%Pred-Post: 105 %
FEV6FVC-%Pred-Pre: 105 %
FVC-%Change-Post: 3 %
FVC-%Pred-Post: 47 %
FVC-%Pred-Pre: 45 %
FVC-Post: 2.32 L
FVC-Pre: 2.24 L
Post FEV1/FVC ratio: 78 %
Post FEV6/FVC ratio: 100 %
Pre FEV1/FVC ratio: 64 %
Pre FEV6/FVC Ratio: 100 %
RV % pred: 108 %
RV: 2.7 L
TLC % pred: 67 %
TLC: 5.04 L

## 2024-07-06 NOTE — Progress Notes (Signed)
 Full PFT performed today.

## 2024-07-06 NOTE — Patient Instructions (Signed)
 Full PFT performed today.

## 2024-07-08 ENCOUNTER — Ambulatory Visit: Payer: Self-pay | Admitting: Internal Medicine

## 2024-07-09 NOTE — Progress Notes (Signed)
 Called pt he is aware and verbalized understanding. Nothing farther needed.

## 2024-07-12 ENCOUNTER — Telehealth: Payer: Self-pay | Admitting: Neurology

## 2024-07-12 NOTE — Telephone Encounter (Signed)
 LVM and sent mychart msg informing pt of need to reschedule 07/16/24 appt - MD out  If patient calls back please schedule him in an 8:30 slot with Dr Chalice

## 2024-07-12 NOTE — Progress Notes (Signed)
   NEUROPSYCHOLOGICAL EVALUATION [facility]  Physical Medicine and Rehabilitation     [name]  MRN: [id number] DOB: September 20, 1955   Service Provider/Clinical Neuropsychologist: Evalene DOROTHA Riff, PsyD  Date of Service: 06/08/2024 Start Time: 1 PM End Time: 12 PM  Location of Service:  [facility] Physical Medicine & Rehabilitation Department [place] [address], [place] [place], [place] [address] Phone: [phone]   Billing Code/Service: [phone]    Individuals present: Patient, spouse, Provider Laurier DOROTHA Riff, PsyD)  Provider conducted the 60-minute interactive feedback appointment in-person with the patient and his spouse, with his permission.  The provider reviewed and discussed the results of neuropsychological evaluation. Follow-up interviewing was conducted as needed to refine interpretation of findings as needed. Review of results included overall findings, diagnosis, and treatment planning/recommendations that were derived from integration of patient data, interpretation of standardized rest results and clinical data, and clinical decision making, which are documented in the patient's electronic medical record with the full report (date listed below). A copy of the full report will also be mailed to the patient.   The patient expressed understanding of the information reviewed. The patient was provided opportunity to ask questions which were then answered by the provider. The provider worked collaboratively to tailor treatment recommendations to the patient when possible. The patient was informed they could reach out to the provider should additional questions related to the evaluation arise.    The final neuropsychological evaluation report, documented in the patient's chart (05/28/24), was amended to reflect any additional information obtained during the feedback appointment including treatment planning collaboration.     This report was generated using voice recognition software. While this document has been carefully reviewed, transcription errors may be present. I apologize in advance for any inconvenience. Please contact me if further clarification is needed.             Evalene DOROTHA Riff, PsyD             Neuropsychologist

## 2024-07-13 ENCOUNTER — Other Ambulatory Visit

## 2024-07-16 ENCOUNTER — Encounter

## 2024-07-16 ENCOUNTER — Ambulatory Visit: Admitting: Neurology

## 2024-07-18 ENCOUNTER — Encounter

## 2024-07-18 ENCOUNTER — Ambulatory Visit: Admitting: Adult Health

## 2024-07-18 ENCOUNTER — Encounter: Payer: Self-pay | Admitting: Adult Health

## 2024-07-18 DIAGNOSIS — Z87891 Personal history of nicotine dependence: Secondary | ICD-10-CM

## 2024-07-18 NOTE — Progress Notes (Signed)
  Virtual Visit via Telephone Note  I connected with JAHLIL ZILLER , 07/18/24 9:06 AM by a telemedicine application and verified that I am speaking with the correct person using two identifiers.  Location: Patient: home Provider: home   I discussed the limitations of evaluation and management by telemedicine and the availability of in person appointments. The patient expressed understanding and agreed to proceed.   Shared Decision Making Visit Lung Cancer Screening Program [phone])   Eligibility: 68 y.o. Pack Years Smoking History Calculation = 63 pack years  (# packs/per year x # years smoked) Recent History of coughing up blood  no Unexplained weight loss? no ( >Than 15 pounds within the last 6 months ) Prior History Lung / other cancer no (Diagnosis within the last 5 years already requiring surveillance chest CT Scans). Smoking Status Former Smoker Former Smokers: Years since quit: < 1 year  Quit Date: 09/2023  Visit Components: Discussion included one or more decision making aids. YES Discussion included risk/benefits of screening. YES Discussion included potential follow up diagnostic testing for abnormal scans. YES Discussion included meaning and risk of over diagnosis. YES Discussion included meaning and risk of False Positives. YES Discussion included meaning of total radiation exposure. YES  Counseling Included: Importance of adherence to annual lung cancer LDCT screening. YES Impact of comorbidities on ability to participate in the program. YES Ability and willingness to under diagnostic treatment. YES  Smoking Cessation Counseling: Former Smokers:  Discussed the importance of maintaining cigarette abstinence. yes Diagnosis Code: Personal History of Nicotine  Dependence. S12.108 Information about tobacco cessation classes and interventions provided to patient. Yes Patient provided with ticket for LDCT Scan. yes Written Order for Lung Cancer Screening with  LDCT placed in Epic. Yes (CT Chest Lung Cancer Screening Low Dose W/O CM) [id number]   Z12.2-Screening of respiratory organs Z87.891-Personal history of nicotine  dependence   Lamarr Myers 07/18/24

## 2024-07-18 NOTE — Patient Instructions (Signed)

## 2024-07-19 DIAGNOSIS — Z981 Arthrodesis status: Secondary | ICD-10-CM | POA: Diagnosis not present

## 2024-07-20 ENCOUNTER — Ambulatory Visit
Admission: RE | Admit: 2024-07-20 | Discharge: 2024-07-20 | Disposition: A | Discharge status: Home / Self Care | Source: Ambulatory Visit | Attending: Acute Care | Admitting: Acute Care

## 2024-07-20 DIAGNOSIS — Z122 Encounter for screening for malignant neoplasm of respiratory organs: Secondary | ICD-10-CM

## 2024-07-20 DIAGNOSIS — Z87891 Personal history of nicotine dependence: Secondary | ICD-10-CM | POA: Diagnosis not present

## 2024-07-23 DIAGNOSIS — Z981 Arthrodesis status: Secondary | ICD-10-CM | POA: Diagnosis not present

## 2024-07-27 ENCOUNTER — Telehealth: Payer: Self-pay

## 2024-07-27 ENCOUNTER — Telehealth: Payer: Self-pay | Admitting: Acute Care

## 2024-07-27 NOTE — Telephone Encounter (Signed)
 Patient's low-dose screening CT was read as a lung RADS 3.  There is a new 5.9 mm superior left lower lobe pulmonary nodule that is new from November 2024 chest CT angiogram.  Please call patient, see if he has been sick or if he has been in his normal state of health to help determine if this may be an infectious or inflammatory process. 68-month follow-up is appropriate.  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  We will follow the 4.2 cm ascending thoracic aorta on his 54-month follow-up CT.   Please make sure he understands the importance of maintaining good blood pressure control.  79-month follow-up low-dose CT, please fax results to PCP let them know about the plan of care.  Thank so much

## 2024-07-27 NOTE — Telephone Encounter (Signed)
 Call Repot form [facility]  IMPRESSION: 1. Lung-RADS 3, probably benign findings. Short-term follow-up in 6 months is recommended with repeat low-dose chest CT without contrast (please use the following order, CT CHEST LCS NODULE FOLLOW-UP W/O CM). Solid 5.9 mm superior segment left lower lobe pulmonary nodule is new from 08/05/2023 chest CT angiogram. 2. 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 3. Dilated main pulmonary artery, suggesting pulmonary arterial hypertension. 4. Three-vessel coronary atherosclerosis. 5. Aortic Atherosclerosis (ICD10-I70.0) and Emphysema (ICD10-J43.9)

## 2024-07-30 ENCOUNTER — Ambulatory Visit: Admitting: Neurology

## 2024-07-30 ENCOUNTER — Other Ambulatory Visit: Payer: Self-pay

## 2024-07-30 ENCOUNTER — Other Ambulatory Visit (HOSPITAL_COMMUNITY): Payer: Self-pay

## 2024-07-30 ENCOUNTER — Encounter: Payer: Self-pay | Admitting: Neurology

## 2024-07-30 VITALS — BP 120/82 | HR 97 | Ht 71.0 in | Wt 211.2 lb

## 2024-07-30 DIAGNOSIS — G3184 Mild cognitive impairment, so stated: Secondary | ICD-10-CM

## 2024-07-30 DIAGNOSIS — Z981 Arthrodesis status: Secondary | ICD-10-CM | POA: Diagnosis not present

## 2024-07-30 MED ORDER — DONEPEZIL HCL 10 MG PO TABS
ORAL_TABLET | ORAL | 3 refills | Status: AC
  Filled 2024-07-30: qty 30, 30d supply, fill #0

## 2024-07-30 MED ORDER — DONEPEZIL HCL 5 MG PO TBDP
ORAL_TABLET | ORAL | 1 refills | Status: AC
  Filled 2024-07-30: qty 30, 30d supply, fill #0
  Filled 2024-09-02: qty 30, 30d supply, fill #1

## 2024-07-30 NOTE — Patient Instructions (Signed)
 Donepezil Tablets What is this medication? DONEPEZIL (doe NEP e zil) treats memory loss and confusion (dementia) in people who have Alzheimer disease. It works by improving attention, memory, and the ability to engage in daily activities. It is not a cure for dementia or Alzheimer disease. This medicine may be used for other purposes; ask your health care provider or pharmacist if you have questions. COMMON BRAND NAME(S): Aricept What should I tell my care team before I take this medication? They need to know if you have any of these conditions: Head injury Heart disease Irregular heartbeat or rhythm Liver disease Lung or breathing disease, such as asthma Seizures Stomach ulcers, other stomach or intestine problems Stomach bleeding Trouble passing urine An unusual or allergic reaction to donepezil, other medications, foods, dyes, or preservatives Pregnant or trying to get pregnant Breastfeeding How should I use this medication? Take this medication by mouth with a glass of water. Follow the directions on the prescription label. You may take this medication with or without food. Take this medication at regular intervals. This medication is usually taken before bedtime. Do not take it more often than directed. Continue to take your medication even if you feel better. Do not stop taking except on your care team's advice. If you are taking the 23 mg donepezil tablet, swallow it whole; do not cut, crush, or chew it. Talk to your care team about the use of this medication in children. Special care may be needed. Overdosage: If you think you have taken too much of this medicine contact a poison control center or emergency room at once. NOTE: This medicine is only for you. Do not share this medicine with others. What if I miss a dose? If you miss a dose, take it as soon as you can. If it is almost time for your next dose, take only that dose, do not take double or extra doses. What may interact  with this medication? Do not take this medication with any of the following: Certain medications for fungal infections, such as itraconazole, fluconazole, posaconazole, voriconazole Cisapride Dextromethorphan; quinidine Dronedarone Pimozide Quinidine Thioridazine This medication may also interact with the following: Antihistamines for allergy, cough, and cold Atropine Bethanechol Carbamazepine Certain medications for bladder problems, such as oxybutynin or tolterodine Certain medications for Parkinson disease, such as benztropine or trihexyphenidyl Certain medications for stomach problems, such as dicyclomine or hyoscyamine Certain medications for travel sickness, such as scopolamine Dexamethasone Dofetilide Ipratropium NSAIDs, medications for pain and inflammation, such as ibuprofen or naproxen Other medications for Alzheimer disease Other medications that cause heart rhythm changes Phenobarbital Phenytoin Rifampin, rifabutin, or rifapentine Ziprasidone This list may not describe all possible interactions. Give your health care provider a list of all the medicines, herbs, non-prescription drugs, or dietary supplements you use. Also tell them if you smoke, drink alcohol, or use illegal drugs. Some items may interact with your medicine. What should I watch for while using this medication? Visit your care team for regular checks on your progress. Tell your care team if your symptoms do not start to get better or if they get worse. This medication may affect your coordination, reaction time, or judgment. Do not drive or operate machinery until you know how this medication affects you. Sit up or stand slowly to reduce the risk of dizzy or fainting spells. Drinking alcohol with this medication can increase the risk of these side effects. What side effects may I notice from receiving this medication? Side effects that you should report  to your care team as soon as possible: Allergic  reactions--skin rash, itching, hives, swelling of the face, lips, tongue, or throat Peptic ulcer--burning stomach pain, loss of appetite, bloating, burping, heartburn, nausea, vomiting Seizures Slow heartbeat--dizziness, feeling faint or lightheaded, confusion, trouble breathing, unusual weakness or fatigue Stomach bleeding--bloody or black, tar-like stools, vomiting blood or brown material that looks like coffee grounds Trouble passing urine Side effects that usually do not require medical attention (report these to your care team if they continue or are bothersome): Diarrhea Fatigue Loss of appetite Muscle pain or cramps Nausea Trouble sleeping This list may not describe all possible side effects. Call your doctor for medical advice about side effects. You may report side effects to FDA at 1-800-FDA-1088. Where should I keep my medication? Keep out of reach of children. Store at room temperature between 15 and 30 degrees C (59 and 86 degrees F). Throw away any unused medication after the expiration date. NOTE: This sheet is a summary. It may not cover all possible information. If you have questions about this medicine, talk to your doctor, pharmacist, or health care provider.  2024 [employer] Standard (2023-03-03 00:00:00)

## 2024-07-30 NOTE — Telephone Encounter (Signed)
 Attempted to call patient and review recent Lung CT results. LVM to call office.
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
## 2024-07-31 ENCOUNTER — Other Ambulatory Visit (HOSPITAL_COMMUNITY): Payer: Self-pay

## 2024-07-31 ENCOUNTER — Other Ambulatory Visit: Payer: Self-pay

## 2024-07-31 DIAGNOSIS — Z122 Encounter for screening for malignant neoplasm of respiratory organs: Secondary | ICD-10-CM

## 2024-07-31 DIAGNOSIS — Z87891 Personal history of nicotine dependence: Secondary | ICD-10-CM

## 2024-07-31 DIAGNOSIS — R911 Solitary pulmonary nodule: Secondary | ICD-10-CM

## 2024-07-31 NOTE — Telephone Encounter (Signed)
 Spoke with patient and reviewed recent Lung CT results. He is in agreement to complete a 6 month follow up scan to evaluate a new 5.4 mm nodule. He has been scheduled for 01/21/2025 and order placed. Pt complains of cough and congestion for almost one month. Advised patient to see PCP for treatment. Pt states he is already scheduled to see Dr. Onita in early December. No further needs.

## 2024-08-18 ENCOUNTER — Other Ambulatory Visit (HOSPITAL_COMMUNITY): Payer: Self-pay

## 2024-08-18 DIAGNOSIS — D869 Sarcoidosis, unspecified: Secondary | ICD-10-CM | POA: Diagnosis not present

## 2024-08-18 DIAGNOSIS — H524 Presbyopia: Secondary | ICD-10-CM | POA: Diagnosis not present

## 2024-08-18 DIAGNOSIS — H401231 Low-tension glaucoma, bilateral, mild stage: Secondary | ICD-10-CM | POA: Diagnosis not present

## 2024-08-18 DIAGNOSIS — H57812 Brow ptosis, left: Secondary | ICD-10-CM | POA: Diagnosis not present

## 2024-08-18 MED ORDER — TIMOLOL MALEATE 0.5 % OP SOLN
1.0000 [drp] | Freq: Two times a day (BID) | OPHTHALMIC | 4 refills | Status: AC
  Filled 2024-08-18: qty 15, 150d supply, fill #0

## 2024-08-27 ENCOUNTER — Other Ambulatory Visit (HOSPITAL_COMMUNITY): Payer: Self-pay

## 2024-08-27 MED ORDER — TRIAMCINOLONE ACETONIDE 0.1 % EX CREA
TOPICAL_CREAM | CUTANEOUS | 3 refills | Status: AC
  Filled 2024-08-27: qty 80, 30d supply, fill #0

## 2024-09-03 ENCOUNTER — Other Ambulatory Visit (HOSPITAL_COMMUNITY): Payer: Self-pay

## 2024-09-26 ENCOUNTER — Other Ambulatory Visit (HOSPITAL_COMMUNITY): Payer: Self-pay

## 2024-09-27 ENCOUNTER — Other Ambulatory Visit (HOSPITAL_COMMUNITY): Payer: Self-pay

## 2024-09-27 MED ORDER — TAMSULOSIN HCL 0.4 MG PO CAPS
0.4000 mg | ORAL_CAPSULE | Freq: Every day | ORAL | 1 refills | Status: AC
  Filled 2024-09-27: qty 90, 90d supply, fill #0

## 2025-01-21 ENCOUNTER — Other Ambulatory Visit

## 2025-07-29 ENCOUNTER — Ambulatory Visit: Admitting: Neurology

## 2025-08-05 ENCOUNTER — Ambulatory Visit: Admitting: Adult Health
